# Patient Record
Sex: Male | Born: 1965 | Race: White | Hispanic: No | Smoking: Current every day smoker
Health system: Southern US, Community
[De-identification: ages and names within clinical notes are randomized; demographics above are authoritative.]

## PROBLEM LIST (undated history)

## (undated) DIAGNOSIS — F329 Major depressive disorder, single episode, unspecified: Secondary | ICD-10-CM

## (undated) DIAGNOSIS — J45909 Unspecified asthma, uncomplicated: Secondary | ICD-10-CM

## (undated) DIAGNOSIS — E669 Obesity, unspecified: Secondary | ICD-10-CM

## (undated) DIAGNOSIS — Z720 Tobacco use: Secondary | ICD-10-CM

## (undated) DIAGNOSIS — N183 Chronic kidney disease, stage 3 unspecified: Secondary | ICD-10-CM

## (undated) DIAGNOSIS — I10 Essential (primary) hypertension: Secondary | ICD-10-CM

## (undated) DIAGNOSIS — K3184 Gastroparesis: Secondary | ICD-10-CM

## (undated) DIAGNOSIS — J449 Chronic obstructive pulmonary disease, unspecified: Secondary | ICD-10-CM

## (undated) DIAGNOSIS — C439 Malignant melanoma of skin, unspecified: Secondary | ICD-10-CM

## (undated) DIAGNOSIS — I219 Acute myocardial infarction, unspecified: Secondary | ICD-10-CM

## (undated) DIAGNOSIS — E785 Hyperlipidemia, unspecified: Secondary | ICD-10-CM

## (undated) DIAGNOSIS — I509 Heart failure, unspecified: Secondary | ICD-10-CM

## (undated) DIAGNOSIS — F32A Depression, unspecified: Secondary | ICD-10-CM

## (undated) DIAGNOSIS — R339 Retention of urine, unspecified: Secondary | ICD-10-CM

## (undated) DIAGNOSIS — K219 Gastro-esophageal reflux disease without esophagitis: Secondary | ICD-10-CM

## (undated) DIAGNOSIS — F419 Anxiety disorder, unspecified: Secondary | ICD-10-CM

## (undated) DIAGNOSIS — I2510 Atherosclerotic heart disease of native coronary artery without angina pectoris: Secondary | ICD-10-CM

## (undated) DIAGNOSIS — D369 Benign neoplasm, unspecified site: Secondary | ICD-10-CM

## (undated) DIAGNOSIS — E1122 Type 2 diabetes mellitus with diabetic chronic kidney disease: Secondary | ICD-10-CM

## (undated) HISTORY — DX: Gastroparesis: K31.84

## (undated) HISTORY — DX: Type 2 diabetes mellitus with diabetic chronic kidney disease: E11.22

## (undated) HISTORY — DX: Chronic kidney disease, stage 3 (moderate): N18.3

## (undated) HISTORY — DX: Depression, unspecified: F32.A

## (undated) HISTORY — PX: CORONARY ANGIOPLASTY WITH STENT PLACEMENT: SHX49

## (undated) HISTORY — DX: Chronic kidney disease, stage 3 unspecified: N18.30

## (undated) HISTORY — DX: Benign neoplasm, unspecified site: D36.9

## (undated) HISTORY — DX: Atherosclerotic heart disease of native coronary artery without angina pectoris: I25.10

## (undated) HISTORY — PX: CARDIAC CATHETERIZATION: SHX172

## (undated) HISTORY — DX: Malignant melanoma of skin, unspecified: C43.9

## (undated) HISTORY — DX: Major depressive disorder, single episode, unspecified: F32.9

## (undated) HISTORY — DX: Hyperlipidemia, unspecified: E78.5

---

## 2005-10-09 DIAGNOSIS — C439 Malignant melanoma of skin, unspecified: Secondary | ICD-10-CM

## 2005-10-09 HISTORY — PX: OTHER SURGICAL HISTORY: SHX169

## 2005-10-09 HISTORY — DX: Malignant melanoma of skin, unspecified: C43.9

## 2010-10-09 DIAGNOSIS — I219 Acute myocardial infarction, unspecified: Secondary | ICD-10-CM

## 2010-10-09 HISTORY — DX: Acute myocardial infarction, unspecified: I21.9

## 2010-10-10 ENCOUNTER — Inpatient Hospital Stay (HOSPITAL_COMMUNITY)
Admission: EM | Admit: 2010-10-10 | Discharge: 2010-10-12 | Payer: Self-pay | Source: Home / Self Care | Attending: Cardiovascular Disease | Admitting: Cardiovascular Disease

## 2010-10-12 LAB — LIPID PANEL
Cholesterol: 173 mg/dL (ref 0–200)
HDL: 35 mg/dL — ABNORMAL LOW (ref >39)
LDL Cholesterol: 100 mg/dL — ABNORMAL HIGH (ref 0–99)
Total CHOL/HDL Ratio: 4.9 RATIO
Triglycerides: 188 mg/dL — ABNORMAL HIGH (ref <150)
VLDL: 38 mg/dL (ref 0–40)

## 2010-10-12 LAB — BRAIN NATRIURETIC PEPTIDE: Pro B Natriuretic peptide (BNP): 102 pg/mL — ABNORMAL HIGH (ref 0.0–100.0)

## 2010-10-12 LAB — GLUCOSE, CAPILLARY
Glucose-Capillary: 234 mg/dL — ABNORMAL HIGH (ref 70–99)
Glucose-Capillary: 241 mg/dL — ABNORMAL HIGH (ref 70–99)

## 2010-10-12 LAB — CORTISOL: Cortisol, Plasma: 17 ug/dL

## 2010-10-17 ENCOUNTER — Ambulatory Visit
Admission: RE | Admit: 2010-10-17 | Discharge: 2010-10-17 | Payer: Self-pay | Source: Home / Self Care | Attending: Adult Health | Admitting: Adult Health

## 2010-10-17 ENCOUNTER — Encounter: Payer: Self-pay | Admitting: Adult Health

## 2010-10-18 ENCOUNTER — Ambulatory Visit: Admit: 2010-10-18 | Payer: Self-pay | Admitting: Cardiology

## 2010-10-18 ENCOUNTER — Encounter: Payer: Self-pay | Admitting: Adult Health

## 2010-11-10 NOTE — Letter (Signed)
Summary: [place] [employer], agricultural at [employer]  [address] [place] [address]   Phone: [phone]  Fax: [phone]     October 17, 2010 MRN: [id number]   [facility] [address] [place], [place]  [address]      YOUR LAB WORK IS DUE  January 09, 2011 _________________________________________  Please go to [facility], located across the street from [facility] on the second floor.  Hours are Monday - Friday 7am until 7:30pm         Saturday 8am until 12noon    _X_  DO NOT EAT OR DRINK AFTER MIDNIGHT EVENING PRIOR TO LABWORK  __ YOUR LABWORK IS NOT FASTING --YOU MAY EAT PRIOR TO LABWORK

## 2010-11-10 NOTE — Assessment & Plan Note (Signed)
Summary: eph needed mon per wife/sn   Visit Type:  Follow-up Primary Provider:  rchd   History of Present Illness: Dustin Cruz is a 45 y/o CM we are seeing on hospital follow-up after admission to [facility] for STEMI on 10/10/2010 and as a result PCI using BMS to 99% ostial hazy stenosis of the ramus.  He was subsequently placed on Effient, Simvistatin, metoprolol, and ASA in addition to previous medications.  He has history of diabetes, and depression with remote ETOH abuse-abstinence with ongoing AA attendence.  He has complaints today of fatigue but is feeling better than when he was hospitalized.  He denies any c/o chest pain, shortness of breath and fluid retention.  He quit smoking and is medically compliant. He remains unemployed and is followed by [facility]. He has occaisional upper abdominal cramps which can be painful to him, but not limiting.  He has not seen his primary since discharge.  Preventive Screening-Counseling & Management  Alcohol-Tobacco     Smoking Status: never  Current Medications (verified): 1)  Neurontin 400 Mg Caps (Gabapentin) .... Take 1 Tab Three Times A Day 2)  Aspirin 325 Mg Tabs (Aspirin) .... Take 1 Tab Daily 3)  Glipizide 10 Mg Tabs (Glipizide) .... Take 1 Tab Two Times A Day 4)  Lopid 600 Mg Tabs (Gemfibrozil) .... Take 1 Tab Two Times A Day 5)  Metformin Hcl 1000 Mg Tabs (Metformin Hcl) .... Take 1 Tab Two Times A Day 6)  Loratadine 10 Mg Tabs (Loratadine) .... Take 1 Tab Daily 7)  Celexa 40 Mg Tabs (Citalopram Hydrobromide) .... Take 1 Tab Daily 8)  Acetaminophen 325 Mg Tabs (Acetaminophen) .... Take As Needed For Pain 9)  Simvastatin 40 Mg Tabs (Simvastatin) .... Take 1 Tab Daily 10)  Metoprolol Tartrate 25 Mg Tabs (Metoprolol Tartrate) .... Take 1 Tab Two Times A Day 11)  Prinivil 5 Mg Tabs (Lisinopril) .... Take 1 Tab Daily 12)  Nitro-Dur 0.4 Mg/hr Pt24 (Nitroglycerin) .... Use As Directed For Chest Pain 13)  Effient 10 Mg Tabs  (Prasugrel Hcl) .... Take 1 Tab Daily 14)  Trazodone Hcl 50 Mg Tabs (Trazodone Hcl) .... Take 1 Tab At Bedtime  Allergies (verified): No Known Drug Allergies  Comments:  Nurse/Medical Assistant: patient brought med list he uses [facility] in [place]  Social History: Full Time Married   Alcohol Use - no Regular Exercise - no Drug Use - no Tobacco Use - No.  Smoking Status:  never  Review of Systems       Abdominal cramping at times.  All other systems have been reviewed and are negative unless stated above.   Vital Signs:  Patient profile:   45 year old male Height:      74 inches Weight:      210 pounds BMI:     27.06 O2 Sat:      96 % on Room air Pulse rate:   81 / minute BP sitting:   112 / 76  (left arm)  Vitals Entered By: Dreama Saa, CNA (October 17, 2010 1:30 PM)  O2 Flow:  Room air  Physical Exam  General:  Well developed, well nourished, in no acute distress. Eyes:  Bilateral xalanthmus Lungs:  Clear bilaterally to auscultation and percussion. Heart:  Non-displaced PMI, chest non-tender; regular rate and rhythm, S1, S2 without murmurs, rubs or gallops. Carotid upstroke normal, no bruit. Normal abdominal aortic size, no bruits. Femorals normal pulses, no bruits. Pedals normal pulses. No edema,  no varicosities. Abdomen:  Bowel sounds positive; abdomen soft and non-tender without masses, organomegaly, or hernias noted. No hepatosplenomegaly. Msk:  Back normal, normal gait. Muscle strength and tone normal. Right groin well healed, no bleed bruit or hematoma. Pulses:  pulses normal in all 4 extremities Extremities:  No clubbing or cyanosis. Neurologic:  Alert and oriented x 3. Psych:  depressed affect.     EKG  Procedure date:  10/17/2010  Findings:      Normal sinus rhythm with rate of:  75 Non specific intraventricular block.  Comments:      Evidence of recent inferior MI.  Evidence of recent lateral MI.    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  Remains on Effient and is on drug program for assistance in getting this medication.  I have given him samples as weill until drug assistance has supplied this medication.  He is without cardiac complaint.  BP and HR are stable.  We will see him in 3 months.  He is to avoid lifting anything over 20lbs.  He is at [employer] and is being trained for new skills.  I have encouraged him to continue this. The following medications were removed from the medication list:    Accupril 5 Mg Tabs (Quinapril hcl) .[name]... Take 1 tab daily His updated medication list for this problem includes:    Aspirin 325 Mg Tabs (Aspirin) .[name]... Take 1 tab daily    Metoprolol Tartrate 25 Mg Tabs (Metoprolol tartrate) .[name]... Take 1 tab two times a day    Prinivil 5 Mg Tabs (Lisinopril) .[name]... Take 1 tab daily    Nitro-dur 0.4 Mg/hr Pt24 (Nitroglycerin) ..... Use as directed for chest pain    Effient 10 Mg Tabs (Prasugrel hcl) .[name]... Take 1 tab daily  Problem # 2:  R/O HYPERLIPIDEMIA (ICD-272.4) He will have follow-up cholesterol studies in 3 months. The following medications were removed from the medication list:    Pravastatin Sodium 40 Mg Tabs (Pravastatin sodium) .[name]... Take 1 tab daily His updated medication list for this problem includes:    Lopid 600 Mg Tabs (Gemfibrozil) .[name]... Take 1 tab two times a day    Simvastatin 40 Mg Tabs (Simvastatin) .[name]... Take 1 tab daily  Future Orders: T-Lipid Profile ([id number]) ... 01/09/2011 T-Hepatic Function [id number]) ... 01/09/2011  Patient Instructions: 1)  Your physician recommends that you schedule a follow-up appointment in: 3 months 2)  Your physician recommends that you return for lab work in: 3 months, just before next office visit. 3)  Your physician recommends that you continue on your current medications as directed. Please refer to the Current Medication list given to you  today. Prescriptions: EFFIENT 10 MG TABS (PRASUGREL HCL) take 1 tab daily  #28 x 0   Entered by:   Larita Fife Via LPN   Authorized by:   Joni Reining, NP   Signed by:   Larita Fife Via LPN on 08/65/7846   Method used:   Samples Given   RxID:   [id number]  Lot # [id number]  Exp date 11/12 =14 tabs Lot# [id number]   Exp date: 08/12= 14 tabs

## 2010-12-19 LAB — CBC
HCT: 40.8 % (ref 39.0–52.0)
HCT: 44.3 % (ref 39.0–52.0)
Hemoglobin: 14.2 g/dL (ref 13.0–17.0)
Hemoglobin: 15.8 g/dL (ref 13.0–17.0)
MCH: 32.3 pg (ref 26.0–34.0)
MCH: 32.7 pg (ref 26.0–34.0)
MCHC: 34.8 g/dL (ref 30.0–36.0)
MCHC: 35.7 g/dL (ref 30.0–36.0)
MCV: 91.7 fL (ref 78.0–100.0)
MCV: 92.9 fL (ref 78.0–100.0)
Platelets: 291 10*3/uL (ref 150–400)
Platelets: 328 10*3/uL (ref 150–400)
RBC: 4.39 MIL/uL (ref 4.22–5.81)
RBC: 4.83 MIL/uL (ref 4.22–5.81)
RDW: 12.8 % (ref 11.5–15.5)
RDW: 13.1 % (ref 11.5–15.5)
WBC: 11.7 10*3/uL — ABNORMAL HIGH (ref 4.0–10.5)
WBC: 6.4 10*3/uL (ref 4.0–10.5)

## 2010-12-19 LAB — BASIC METABOLIC PANEL
BUN: 14 mg/dL (ref 6–23)
BUN: 16 mg/dL (ref 6–23)
CO2: 26 mEq/L (ref 19–32)
CO2: 26 mEq/L (ref 19–32)
Calcium: 8.9 mg/dL (ref 8.4–10.5)
Calcium: 9.2 mg/dL (ref 8.4–10.5)
Chloride: 101 mEq/L (ref 96–112)
Chloride: 102 mEq/L (ref 96–112)
Creatinine, Ser: 0.87 mg/dL (ref 0.4–1.5)
Creatinine, Ser: 0.91 mg/dL (ref 0.4–1.5)
GFR calc Af Amer: >60 mL/min (ref >60)
GFR calc Af Amer: >60 mL/min (ref >60)
GFR calc non Af Amer: >60 mL/min (ref >60)
GFR calc non Af Amer: >60 mL/min (ref >60)
Glucose, Bld: 196 mg/dL — ABNORMAL HIGH (ref 70–99)
Glucose, Bld: 314 mg/dL — ABNORMAL HIGH (ref 70–99)
Potassium: 4.3 mEq/L (ref 3.5–5.1)
Potassium: 4.5 mEq/L (ref 3.5–5.1)
Sodium: 136 mEq/L (ref 135–145)
Sodium: 137 mEq/L (ref 135–145)

## 2010-12-19 LAB — GLUCOSE, CAPILLARY
Glucose-Capillary: 104 mg/dL — ABNORMAL HIGH (ref 70–99)
Glucose-Capillary: 183 mg/dL — ABNORMAL HIGH (ref 70–99)
Glucose-Capillary: 217 mg/dL — ABNORMAL HIGH (ref 70–99)
Glucose-Capillary: 254 mg/dL — ABNORMAL HIGH (ref 70–99)
Glucose-Capillary: 254 mg/dL — ABNORMAL HIGH (ref 70–99)

## 2010-12-19 LAB — POCT I-STAT 3, ART BLOOD GAS (G3+)
Acid-Base Excess: 1 mmol/L (ref 0.0–2.0)
Bicarbonate: 27.1 mEq/L — ABNORMAL HIGH (ref 20.0–24.0)
O2 Saturation: 96 %
TCO2: 28 mmol/L (ref 0–100)
pCO2 arterial: 44.8 mmHg (ref 35.0–45.0)
pH, Arterial: 7.389 (ref 7.350–7.450)
pO2, Arterial: 86 mmHg (ref 80.0–100.0)

## 2010-12-19 LAB — POCT I-STAT, CHEM 8
BUN: 19 mg/dL (ref 6–23)
Calcium, Ion: 1.2 mmol/L (ref 1.12–1.32)
Chloride: 102 mEq/L (ref 96–112)
Creatinine, Ser: 1 mg/dL (ref 0.4–1.5)
Glucose, Bld: 319 mg/dL — ABNORMAL HIGH (ref 70–99)
HCT: 47 % (ref 39.0–52.0)
Hemoglobin: 16 g/dL (ref 13.0–17.0)
Potassium: 4.4 mEq/L (ref 3.5–5.1)
Sodium: 136 mEq/L (ref 135–145)
TCO2: 27 mmol/L (ref 0–100)

## 2010-12-19 LAB — MRSA PCR SCREENING: MRSA by PCR: NEGATIVE

## 2010-12-19 LAB — D-DIMER, QUANTITATIVE: D-Dimer, Quant: 0.23 ug/mL-FEU (ref 0.00–0.48)

## 2010-12-19 LAB — CARDIAC PANEL(CRET KIN+CKTOT+MB+TROPI)
CK, MB: 2.2 ng/mL (ref 0.3–4.0)
Relative Index: INVALID (ref 0.0–2.5)
Total CK: 44 U/L (ref 7–232)
Troponin I: 0.03 ng/mL (ref 0.00–0.06)

## 2010-12-19 LAB — HEMOGLOBIN A1C
Hgb A1c MFr Bld: 10.2 % — ABNORMAL HIGH (ref <5.7)
Mean Plasma Glucose: 246 mg/dL — ABNORMAL HIGH (ref <117)

## 2011-01-06 ENCOUNTER — Ambulatory Visit (INDEPENDENT_AMBULATORY_CARE_PROVIDER_SITE_OTHER): Payer: Self-pay | Admitting: Adult Health

## 2011-01-06 ENCOUNTER — Encounter: Payer: Self-pay | Admitting: Adult Health

## 2011-01-06 DIAGNOSIS — I2510 Atherosclerotic heart disease of native coronary artery without angina pectoris: Secondary | ICD-10-CM

## 2011-01-06 DIAGNOSIS — E7800 Pure hypercholesterolemia, unspecified: Secondary | ICD-10-CM

## 2011-01-06 DIAGNOSIS — E785 Hyperlipidemia, unspecified: Secondary | ICD-10-CM

## 2011-01-06 NOTE — Progress Notes (Signed)
  Subjective:    Patient ID: Dustin Cruz, male    DOB: 23-Oct-1965, 45 y.o.   MRN: [id number]  HPI Dustin Cruz returns today for 3 month follow-up with known history of CAD with BMS to the Ramus in 10/2010.  He is followed by the health department and receives drug assistance with Effient, Crestor, and metoprolol.  He is very sedentary and was told in the past not to lift weight over 5 lbs. He continues to feel some shortness of breath with exertion which is related to decreased stamina. He denies other symptoms at this time.  He has not had any labs drawn for over 3 months.  He also suffers from depression and ETOH abuse for which he has been abstinent since the first of the year and attend AA support groups. He is accompanied by his case worker.     Review of SystemsReview of systems complete and found to be negative unless listed above     Objective:   Physical ExamGeneral: Well developed, well nourished, in no acute distress Head: Eyes PERRLA,  Bilateral xanthomas.   Normal cephalic and atramatic  Lungs: Clear bilaterally to auscultation and percussion. Heart: HRRR S1 S2, with soft S4 murmur.  Pulses are 2+ & equal.            No carotid bruit. No JVD.  No abdominal bruits. No femoral bruits. Abdomen: Bowel sounds are positive, abdomen soft and non-tender without masses or                  Hernia's noted. Obese Msk:  Back normal, normal gait. Normal strength and tone for age. Extremities: No clubbing, cyanosis or edema.  DP +1 Neuro: Alert and oriented X 3. Psych:  Good affect, responds appropriately        Assessment & Plan:

## 2011-01-06 NOTE — Patient Instructions (Signed)
Your physician recommends that you schedule a follow-up appointment in: 6 months Your physician recommends that you return for lab work in: soon Your physician has recommended you make the following change in your medication: STOP TAKING ZOCOR (continue to take 1 Crestor tablet at bedtime) Your physician encouraged you to lose weight for better health. Please start a low chlesterol diet. Your physician discussed the importance of regular exercise and recommended that you start or continue a regular exercise program for good health. Please try to walk 30 mins. Everyday.

## 2011-02-10 ENCOUNTER — Encounter: Payer: Self-pay | Admitting: Adult Health

## 2011-06-20 ENCOUNTER — Ambulatory Visit (INDEPENDENT_AMBULATORY_CARE_PROVIDER_SITE_OTHER): Payer: Self-pay | Admitting: Adult Health

## 2011-06-20 ENCOUNTER — Encounter: Payer: Self-pay | Admitting: Adult Health

## 2011-06-20 DIAGNOSIS — I2510 Atherosclerotic heart disease of native coronary artery without angina pectoris: Secondary | ICD-10-CM

## 2011-06-20 DIAGNOSIS — E7800 Pure hypercholesterolemia, unspecified: Secondary | ICD-10-CM

## 2011-06-20 NOTE — Progress Notes (Signed)
HPI:Dustin Cruz is a 45 y/o male patient of Dr. Dietrich Pates with history of CAD with BMS to the Ramus, in 10/2009. He is followed by the health dept in [place] for drug assistance and medical management. He denies any recurrence of chest pain, shortness of breath, but has some lightheadedness. He has a history of depression and former ETOH abuse and is followed by psychiatry.   No Known Allergies  Current Outpatient Prescriptions  Medication Sig Dispense Refill  . aspirin 325 MG tablet Take 325 mg by mouth daily.        . citalopram (CELEXA) 40 MG tablet Take 40 mg by mouth daily.        [name] gabapentin (NEURONTIN) 400 MG capsule Take 400 mg by mouth 3 (three) times daily.        [name] gemfibrozil (LOPID) 600 MG tablet Take 600 mg by mouth 2 (two) times daily before a meal.        . glipiZIDE (GLUCOTROL) 10 MG tablet Take 10 mg by mouth 2 (two) times daily before a meal.        . insulin glargine (LANTUS SOLOSTAR) 100 UNIT/ML injection Inject 10 Units into the skin at bedtime.        [name] lisinopril (PRINIVIL,ZESTRIL) 5 MG tablet Take 5 mg by mouth daily.        . Loratadine 10 MG CAPS Take 1 capsule by mouth daily.        . metFORMIN (GLUCOPHAGE) 1000 MG tablet Take 1,000 mg by mouth 2 (two) times daily with a meal.        . metoprolol tartrate (LOPRESSOR) 25 MG tablet Take 25 mg by mouth 2 (two) times daily.        . nitroGLYCERIN (NITROSTAT) 0.4 MG SL tablet Place 0.4 mg under the tongue every 5 (five) minutes as needed.        . prasugrel (EFFIENT) 10 MG TABS Take 10 mg by mouth daily.        . rosuvastatin (CRESTOR) 10 MG tablet Take 10 mg by mouth daily.        . traZODone (DESYREL) 50 MG tablet Take 50 mg by mouth at bedtime.          Past Medical History  Diagnosis Date  . Diabetes mellitus   . Hyperlipidemia   . Depression   . Coronary artery disease     99% ostial ramus.  s/p PCI with BMS 10/2010    No past surgical history on file.  ZOX:WRUEAV of systems complete and found to be negative  unless listed above PHYSICAL EXAM BP 121/81  Pulse 72  Resp 18  Ht 6\' 1"  (1.854 m)  Wt 217 lb 6.4 oz (98.612 kg)  BMI 28.68 kg/m2  SpO2 95%  General: Well developed, well nourished, in no acute distress Head: Eyes PERRLA, injected. No xanthomas.   Normal cephalic and atramatic  Lungs: Clear bilaterally to auscultation and percussion. Heart: HRRR S1 S2, without MRG.  Pulses are 2+ & equal.            No carotid bruit. No JVD.  No abdominal bruits. No femoral bruits. Abdomen: Bowel sounds are positive, abdomen soft and non-tender without masses or  Hernia's noted. Msk:  Back normal, normal gait. Normal strength and tone for age. Extremities: No clubbing, cyanosis or edema.  DP +1 Neuro: Alert and oriented X 3. Psych:  Good affect, responds appropriately   ASSESSMENT AND PLAN

## 2011-06-20 NOTE — Assessment & Plan Note (Signed)
He will have fasting lipids and LFT's drawn at health dept and results will be sent to [place]. I have given him samples of Crestor 10mg  to take with him.

## 2011-06-20 NOTE — Assessment & Plan Note (Signed)
He is stable from cardiac standpoint without symptoms.  He may be able to be taken off of prasergrel on next visit as he has a BMS and has had plenty of time to endothilialize.  He is concerned about affording his medications should he be unable to keep getting them from the health dept.  I have given samples of prasergrel for now and will stop this medication on next visit in 6 months.

## 2011-06-20 NOTE — Patient Instructions (Signed)
**Note De-identified Dustin Cruz Obfuscation** Your physician recommends that you continue on your current medications as directed. Please refer to the Current Medication list given to you today.  Your physician recommends that you schedule a follow-up appointment in: 6 months  

## 2011-09-19 ENCOUNTER — Other Ambulatory Visit: Payer: Self-pay

## 2011-09-19 ENCOUNTER — Emergency Department (HOSPITAL_COMMUNITY)
Admission: EM | Admit: 2011-09-19 | Discharge: 2011-09-19 | Disposition: A | Discharge status: Home / Self Care | Payer: Self-pay | Attending: Emergency Medicine | Admitting: Emergency Medicine

## 2011-09-19 ENCOUNTER — Encounter (HOSPITAL_COMMUNITY): Payer: Self-pay

## 2011-09-19 DIAGNOSIS — F3289 Other specified depressive episodes: Secondary | ICD-10-CM | POA: Insufficient documentation

## 2011-09-19 DIAGNOSIS — I2510 Atherosclerotic heart disease of native coronary artery without angina pectoris: Secondary | ICD-10-CM | POA: Insufficient documentation

## 2011-09-19 DIAGNOSIS — R296 Repeated falls: Secondary | ICD-10-CM | POA: Insufficient documentation

## 2011-09-19 DIAGNOSIS — Z7982 Long term (current) use of aspirin: Secondary | ICD-10-CM | POA: Insufficient documentation

## 2011-09-19 DIAGNOSIS — E669 Obesity, unspecified: Secondary | ICD-10-CM | POA: Insufficient documentation

## 2011-09-19 DIAGNOSIS — E119 Type 2 diabetes mellitus without complications: Secondary | ICD-10-CM | POA: Insufficient documentation

## 2011-09-19 DIAGNOSIS — M79609 Pain in unspecified limb: Secondary | ICD-10-CM | POA: Insufficient documentation

## 2011-09-19 DIAGNOSIS — F329 Major depressive disorder, single episode, unspecified: Secondary | ICD-10-CM | POA: Insufficient documentation

## 2011-09-19 DIAGNOSIS — R1084 Generalized abdominal pain: Secondary | ICD-10-CM | POA: Insufficient documentation

## 2011-09-19 DIAGNOSIS — R51 Headache: Secondary | ICD-10-CM | POA: Insufficient documentation

## 2011-09-19 DIAGNOSIS — IMO0002 Reserved for concepts with insufficient information to code with codable children: Secondary | ICD-10-CM | POA: Insufficient documentation

## 2011-09-19 DIAGNOSIS — E785 Hyperlipidemia, unspecified: Secondary | ICD-10-CM | POA: Insufficient documentation

## 2011-09-19 DIAGNOSIS — Z794 Long term (current) use of insulin: Secondary | ICD-10-CM | POA: Insufficient documentation

## 2011-09-19 DIAGNOSIS — Z79899 Other long term (current) drug therapy: Secondary | ICD-10-CM | POA: Insufficient documentation

## 2011-09-19 DIAGNOSIS — M79604 Pain in right leg: Secondary | ICD-10-CM

## 2011-09-19 DIAGNOSIS — R29898 Other symptoms and signs involving the musculoskeletal system: Secondary | ICD-10-CM | POA: Insufficient documentation

## 2011-09-19 DIAGNOSIS — R112 Nausea with vomiting, unspecified: Secondary | ICD-10-CM | POA: Insufficient documentation

## 2011-09-19 LAB — CBC
MCH: 33.3 pg (ref 26.0–34.0)
MCV: 93.3 fL (ref 78.0–100.0)
Platelets: 405 10*3/uL — ABNORMAL HIGH (ref 150–400)
RBC: 4.48 MIL/uL (ref 4.22–5.81)

## 2011-09-19 LAB — COMPREHENSIVE METABOLIC PANEL
AST: 10 U/L (ref 0–37)
BUN: 18 mg/dL (ref 6–23)
CO2: 26 mEq/L (ref 19–32)
Calcium: 9.6 mg/dL (ref 8.4–10.5)
Creatinine, Ser: 0.92 mg/dL (ref 0.50–1.35)
GFR calc Af Amer: >90 mL/min (ref >90)
GFR calc non Af Amer: >90 mL/min (ref >90)
Glucose, Bld: 135 mg/dL — ABNORMAL HIGH (ref 70–99)

## 2011-09-19 LAB — POCT I-STAT TROPONIN I: Troponin i, poc: 0 ng/mL (ref 0.00–0.08)

## 2011-09-19 LAB — URINALYSIS, ROUTINE W REFLEX MICROSCOPIC
Leukocytes, UA: NEGATIVE
Nitrite: NEGATIVE
Specific Gravity, Urine: 1.014 (ref 1.005–1.030)
Urobilinogen, UA: 0.2 mg/dL (ref 0.0–1.0)

## 2011-09-19 MED ORDER — HYDROCODONE-ACETAMINOPHEN 5-325 MG PO TABS: 1.0000 | ORAL_TABLET | Freq: Four times a day (QID) | ORAL | Status: AC | PRN

## 2011-09-19 MED ORDER — MORPHINE SULFATE 4 MG/ML IJ SOLN
4.0000 mg | Freq: Once | INTRAMUSCULAR | Status: AC
  Administered 2011-09-19: 4 mg via INTRAVENOUS
  Filled 2011-09-19: qty 1

## 2011-09-19 MED ORDER — ONDANSETRON HCL 4 MG/2ML IJ SOLN
4.0000 mg | Freq: Once | INTRAMUSCULAR | Status: AC
  Administered 2011-09-19: 4 mg via INTRAVENOUS
  Filled 2011-09-19: qty 2

## 2011-09-19 MED ORDER — ONDANSETRON 4 MG PO TBDP: 4.0000 mg | ORAL_TABLET | Freq: Three times a day (TID) | ORAL | Status: AC | PRN

## 2011-09-19 NOTE — ED Notes (Signed)
Pt here by [facility] for sick x 3 days, n/v x 1 day, this am had right leg weakness, hx of MI last year with the same symptoms. Normal ekg.

## 2011-09-19 NOTE — ED Notes (Signed)
Pt ambulated with a steady gait; had crackers and soda with no nausea; no sign of distress; ambulated with a steady gait; VSS; A&Ox3. Pt reports no questions at this time.

## 2011-09-19 NOTE — ED Provider Notes (Signed)
History     CSN: [id number] Arrival date & time: 09/19/2011  4:07 PM   First MD Initiated Contact with Patient 09/19/11 1635      Chief Complaint  Patient presents with  . Extremity Weakness    right leg    (Consider location/radiation/quality/duration/timing/severity/associated sxs/prior treatment) Patient is a 45 y.o. male presenting with vomiting. The history is provided by the patient.  Emesis  This is a new problem. The current episode started yesterday. The problem occurs 5 to 10 times per day. The problem has not changed since onset.The emesis has an appearance of stomach contents and bilious material. There has been no fever. Associated symptoms include abdominal pain and headaches. Pertinent negatives include no chills, no cough, no diarrhea, no fever, no myalgias, no sweats and no URI.   patient describes generalized abdominal pain described as soreness that has been intermittent and began after the vomiting. Nothing makes the symptoms better or worse there has been no prior treatment. The patient also describes some intermittent weakness in the right leg for 5 days but denies numbness. He reports that today he felt like his right leg gave out on him and he fell to the floor. Describes pain to the right anterior lower leg is the only injuries sustained in the fall. He says he has had aching into the proximal right anterior thigh associated with this intermittent weakness and he questions whether it is related to a cardiac cath he had performed in January of this year.  Past Medical History  Diagnosis Date  . Diabetes mellitus   . Hyperlipidemia   . Depression   . Coronary artery disease     99% ostial ramus.  s/p PCI with BMS 10/2010    History reviewed. No pertinent past surgical history.  History reviewed. No pertinent family history.  History  Substance Use Topics  . Smoking status: Current Some Day Smoker -- 1.0 packs/day    Types: Cigarettes  . Smokeless tobacco:  Never Used  . Alcohol Use: No      Review of Systems  Constitutional: Negative for fever, chills and diaphoresis.  HENT: Negative for congestion, sore throat, neck pain, neck stiffness and tinnitus.   Eyes: Negative for pain and visual disturbance.  Respiratory: Negative for cough, chest tightness, shortness of breath and wheezing.   Cardiovascular: Negative for chest pain, palpitations and leg swelling.  Gastrointestinal: Positive for vomiting and abdominal pain. Negative for diarrhea.  Genitourinary: Negative for dysuria, hematuria and flank pain.  Musculoskeletal: Negative for myalgias, back pain, joint swelling and gait problem.       Leg pain as described in history of present illness  Skin: Negative for color change and rash.       Abrasion to right leg  Neurological: Positive for weakness and headaches. Negative for dizziness, syncope, facial asymmetry, speech difficulty, light-headedness and numbness.  Hematological: Does not bruise/bleed easily.  Psychiatric/Behavioral: Negative for behavioral problems and confusion.    Allergies  Review of patient's allergies indicates no known allergies.  Home Medications   Current Outpatient Rx  Name Route Sig Dispense Refill  . ASPIRIN 325 MG PO TABS Oral Take 325 mg by mouth daily.      [name] CITALOPRAM HYDROBROMIDE 40 MG PO TABS Oral Take 40 mg by mouth daily.      [name] GABAPENTIN 400 MG PO CAPS Oral Take 400 mg by mouth 3 (three) times daily.      [name] GEMFIBROZIL 600 MG PO TABS Oral Take 600  mg by mouth 2 (two) times daily before a meal.      . GLIPIZIDE 10 MG PO TABS Oral Take 10 mg by mouth 2 (two) times daily before a meal.      . INSULIN GLARGINE 100 UNIT/ML [name] SOLN Subcutaneous Inject 10 Units into the skin at bedtime.      [name] LISINOPRIL 5 MG PO TABS Oral Take 5 mg by mouth daily.      [name] LORATADINE 10 MG PO CAPS Oral Take 1 capsule by mouth daily.      [name] METFORMIN HCL 1000 MG PO TABS Oral Take 1,000 mg by mouth 2 (two) times daily  with a meal.      . METOPROLOL TARTRATE 25 MG PO TABS Oral Take 25 mg by mouth 2 (two) times daily.      [name] NITROGLYCERIN 0.4 MG SL SUBL Sublingual Place 0.4 mg under the tongue every 5 (five) minutes as needed. For chest pain.    [name] PRASUGREL HCL 10 MG PO TABS Oral Take 10 mg by mouth daily.      [name] ROSUVASTATIN CALCIUM 10 MG PO TABS Oral Take 10 mg by mouth daily.      . TRAZODONE HCL 50 MG PO TABS Oral Take 50 mg by mouth at bedtime.        BP 125/77  Pulse 75  Temp(Src) 98 F (36.7 C) (Oral)  Resp 18  SpO2 95%  Physical Exam  Nursing note and vitals reviewed. Constitutional: He is oriented to person, place, and time. He appears well-developed and well-nourished. No distress.  HENT:  Head: Normocephalic and atraumatic.  Right Ear: External ear normal.  Left Ear: External ear normal.  Mouth/Throat: Oropharynx is clear and moist.  Eyes: Conjunctivae and EOM are normal. Pupils are equal, round, and reactive to light.  Neck: Normal range of motion. Neck supple. No JVD present.  Cardiovascular: Normal rate, regular rhythm, normal heart sounds and intact distal pulses.   Pulmonary/Chest: Effort normal and breath sounds normal. No respiratory distress. He has no wheezes. He exhibits no tenderness.  Abdominal: Soft. Bowel sounds are normal.       Obese, soft, non-distended with no guarding, rebound, or mass. Generalized mild tenderness to deep palpation.  Musculoskeletal: Normal range of motion. He exhibits no edema and no tenderness.  Lymphadenopathy:    He has no cervical adenopathy.  Neurological: He is alert and oriented to person, place, and time. He displays normal reflexes. No cranial nerve deficit. Coordination normal.       Gait slow but with steady base. Sensation intact to light touch  Skin: Skin is warm and dry. No rash noted.       1 cm abrasion to right anterior mid lower leg with no bleeding  Psychiatric: He has a normal mood and affect. His behavior is normal.    ED  Course  Procedures (including critical care time)  Labs Reviewed  CBC - Abnormal; Notable for the following:    Platelets 405 (*)    All other components within normal limits  COMPREHENSIVE METABOLIC PANEL - Abnormal; Notable for the following:    Glucose, Bld 135 (*)    Total Bilirubin 0.2 (*)    All other components within normal limits  URINALYSIS, ROUTINE W REFLEX MICROSCOPIC - Abnormal; Notable for the following:    Glucose, UA 100 (*)    All other components within normal limits  POCT I-STAT TROPONIN I  I-STAT TROPONIN I   No results  found.   Date: 09/19/2011  Rate: 71  Rhythm: normal sinus rhythm  QRS Axis: normal  Intervals: normal  ST/T Wave abnormalities: nonspecific t-wave abnormality in lateral leads  Conduction Disutrbances:left bundle branch block  Narrative Interpretation:   Old EKG Reviewed: unchanged    Diagnosis #1: Nausea and vomiting Diagnosis #2: Right leg pain   MDM  Patient with one day of nausea and vomiting with generalized mild abdominal pain. Labs are unremarkable. The patient appears well and is afebrile; he has tolerated an oral trial in the emergency department.  As he reported concerns about his heart with a previous MI, an EKG was performed and this shows no changes from his prior; he also had a negative troponin after greater than 6 hours of symptoms.  Although he complains of right proximal anterior thigh pain, there is no tenderness to palpation and there is no strength deficit in the extremity. Radiographic studies are not indicated.  I have discussed the findings with the patient and he is comfortable with being discharged home with antiemetic and has been instructed to followup with his primary doctor in the next couple of days.        Elwyn Reach [place], [place] 09/19/11 2051  Shaaron Adler, [place] 09/19/11 2056

## 2011-09-19 NOTE — ED Notes (Signed)
RN introduced self to pt

## 2011-09-19 NOTE — ED Notes (Signed)
Patient is resting comfortably. Encouraged to call if nauseated.

## 2011-09-20 NOTE — ED Provider Notes (Signed)
Evaluation and management procedures were performed by the PA/NP under my supervision/collaboration.   Baldomero Mirarchi, MD 09/20/11 0034 

## 2011-12-27 ENCOUNTER — Ambulatory Visit (INDEPENDENT_AMBULATORY_CARE_PROVIDER_SITE_OTHER): Payer: Medicaid Other | Admitting: Adult Health

## 2011-12-27 ENCOUNTER — Encounter: Payer: Self-pay | Admitting: Adult Health

## 2011-12-27 VITALS — BP 126/83 | HR 76 | Ht 74.0 in | Wt 217.0 lb

## 2011-12-27 DIAGNOSIS — I2510 Atherosclerotic heart disease of native coronary artery without angina pectoris: Secondary | ICD-10-CM

## 2011-12-27 DIAGNOSIS — E7800 Pure hypercholesterolemia, unspecified: Secondary | ICD-10-CM

## 2011-12-27 MED ORDER — PRASUGREL HCL 10 MG PO TABS: 10.0000 mg | ORAL_TABLET | Freq: Every day | ORAL | Status: DC

## 2011-12-27 MED ORDER — METOPROLOL SUCCINATE ER 25 MG PO TB24: 25.0000 mg | ORAL_TABLET | Freq: Every day | ORAL | Status: DC

## 2011-12-27 MED ORDER — ROSUVASTATIN CALCIUM 10 MG PO TABS: 10.0000 mg | ORAL_TABLET | Freq: Every day | ORAL | Status: DC

## 2011-12-27 NOTE — Assessment & Plan Note (Signed)
He is without cardiac symptoms. He unfortunately continues to smoke and eat high fat diet from fast food. I will continue his medications as he is tolerating them without bleeding, coughing or fatigue. He will continue prasugrel as well. I have discussed this with him and he is willing to continue this medication. With his multiple CVRF and no change in his lifestyle, or modifiable risk factors, I feel it is best to keep him on anticoagulation indefinitely.

## 2011-12-27 NOTE — Progress Notes (Signed)
HPI: [name] is a 46 y/o patient of Dr. Dietrich Pates we are seeing for ongoing assessment and treatment of CAD with BMS to ramus in 10/2009. He is followed by the health dept in [place] for drug assistance and medical management of diabetes, depression/anxiety. He, unfortunately, continues to smoke 2ppd, drink ETOH. He is complaint with his medications. He has had recent labs per the health dept on but we have copies only of the 08/2011 results. He is without complaint with the exception of chronic neuropathy pain in his hands and feet. No chest pain, no DOE. He has no desire to quit smoking.  No Known Allergies  Current Outpatient Prescriptions  Medication Sig Dispense Refill  . aspirin 325 MG tablet Take 325 mg by mouth daily.        . citalopram (CELEXA) 40 MG tablet Take 20 mg by mouth daily.       [name] gabapentin (NEURONTIN) 400 MG capsule Take 400 mg by mouth 3 (three) times daily.        [name] gemfibrozil (LOPID) 600 MG tablet Take 600 mg by mouth 2 (two) times daily before a meal.        . insulin glargine (LANTUS SOLOSTAR) 100 UNIT/ML injection Inject 10 Units into the skin at bedtime.        . Loratadine 10 MG CAPS Take 1 capsule by mouth daily.        . metFORMIN (GLUCOPHAGE) 1000 MG tablet Take 1,000 mg by mouth 2 (two) times daily with a meal.        . metoprolol succinate (TOPROL-XL) 25 MG 24 hr tablet Take 1 tablet (25 mg total) by mouth daily.  30 tablet  12  . naproxen (NAPROSYN) 500 MG tablet Take 500 mg by mouth as needed.      . nitroGLYCERIN (NITROSTAT) 0.4 MG SL tablet Place 0.4 mg under the tongue every 5 (five) minutes as needed. For chest pain.      . prasugrel (EFFIENT) 10 MG TABS Take 1 tablet (10 mg total) by mouth daily.  30 tablet  12  . rosuvastatin (CRESTOR) 10 MG tablet Take 1 tablet (10 mg total) by mouth daily.  30 tablet  12  . traZODone (DESYREL) 50 MG tablet Take 50 mg by mouth at bedtime.          Past Medical History  Diagnosis Date  . Diabetes mellitus   .  Hyperlipidemia   . Depression   . Coronary artery disease     99% ostial ramus.  s/p PCI with BMS 10/2010    No past surgical history on file.  ROS: Review of systems complete and found to be negative unless listed above PHYSICAL EXAM BP 126/83  Pulse 76  Ht 6\' 2"  (1.88 m)  Wt 217 lb (98.431 kg)  BMI 27.86 kg/m2  General: Well developed, well nourished, in no acute distress Head: Eyes PERRLA, No xanthomas.   Normal cephalic and atramatic  Lungs: Clear bilaterally to auscultation and percussion. Heart: HRRR S1 S2, without MRG.  Pulses are 2+ & equal.            No carotid bruit. No JVD.  No abdominal bruits. No femoral bruits. Abdomen: Bowel sounds are positive, abdomen soft and non-tender without masses or                  Hernia's noted. Msk:  Back normal, normal gait. Normal strength and tone for age. Extremities: No clubbing, cyanosis or edema.  DP +1 Neuro: Alert and oriented X 3. Psych:  Flat affect, responds appropriately    ASSESSMENT AND PLAN

## 2011-12-27 NOTE — Assessment & Plan Note (Signed)
Most recent TC 171, TG 132, HDL 42 LDL 103. He has had recent labs completed, will await those results. Continue crestor as directed.

## 2011-12-27 NOTE — Patient Instructions (Signed)
Your physician recommends that you schedule a follow-up appointment in: 6 months  Stool Cards x 3 and return to office

## 2012-01-22 ENCOUNTER — Emergency Department (HOSPITAL_COMMUNITY): Payer: Medicaid Other

## 2012-01-22 ENCOUNTER — Other Ambulatory Visit: Payer: Self-pay

## 2012-01-22 ENCOUNTER — Inpatient Hospital Stay (HOSPITAL_COMMUNITY)
Admission: EM | Admit: 2012-01-22 | Discharge: 2012-01-23 | DRG: 287 | Disposition: A | Discharge status: Home / Self Care | Payer: Medicaid Other | Source: Ambulatory Visit | Attending: Internal Medicine | Admitting: Internal Medicine

## 2012-01-22 ENCOUNTER — Encounter (HOSPITAL_COMMUNITY): Payer: Self-pay | Admitting: *Deleted

## 2012-01-22 ENCOUNTER — Encounter (HOSPITAL_COMMUNITY)
Admission: EM | Disposition: A | Discharge status: Home / Self Care | Payer: Self-pay | Source: Ambulatory Visit | Attending: Internal Medicine

## 2012-01-22 ENCOUNTER — Ambulatory Visit (HOSPITAL_COMMUNITY): Admit: 2012-01-22 | Payer: Self-pay | Admitting: Internal Medicine

## 2012-01-22 DIAGNOSIS — I2510 Atherosclerotic heart disease of native coronary artery without angina pectoris: Principal | ICD-10-CM | POA: Diagnosis present

## 2012-01-22 DIAGNOSIS — F329 Major depressive disorder, single episode, unspecified: Secondary | ICD-10-CM | POA: Diagnosis present

## 2012-01-22 DIAGNOSIS — R0609 Other forms of dyspnea: Secondary | ICD-10-CM | POA: Diagnosis present

## 2012-01-22 DIAGNOSIS — E119 Type 2 diabetes mellitus without complications: Secondary | ICD-10-CM | POA: Diagnosis present

## 2012-01-22 DIAGNOSIS — R0989 Other specified symptoms and signs involving the circulatory and respiratory systems: Secondary | ICD-10-CM | POA: Diagnosis present

## 2012-01-22 DIAGNOSIS — Z7982 Long term (current) use of aspirin: Secondary | ICD-10-CM

## 2012-01-22 DIAGNOSIS — E7800 Pure hypercholesterolemia, unspecified: Secondary | ICD-10-CM | POA: Diagnosis present

## 2012-01-22 DIAGNOSIS — I200 Unstable angina: Secondary | ICD-10-CM | POA: Diagnosis present

## 2012-01-22 DIAGNOSIS — Z6827 Body mass index (BMI) 27.0-27.9, adult: Secondary | ICD-10-CM

## 2012-01-22 DIAGNOSIS — R079 Chest pain, unspecified: Secondary | ICD-10-CM

## 2012-01-22 DIAGNOSIS — F17200 Nicotine dependence, unspecified, uncomplicated: Secondary | ICD-10-CM | POA: Diagnosis present

## 2012-01-22 DIAGNOSIS — F3289 Other specified depressive episodes: Secondary | ICD-10-CM | POA: Diagnosis present

## 2012-01-22 DIAGNOSIS — Z9861 Coronary angioplasty status: Secondary | ICD-10-CM

## 2012-01-22 DIAGNOSIS — E669 Obesity, unspecified: Secondary | ICD-10-CM | POA: Diagnosis present

## 2012-01-22 DIAGNOSIS — I447 Left bundle-branch block, unspecified: Secondary | ICD-10-CM | POA: Diagnosis present

## 2012-01-22 DIAGNOSIS — I213 ST elevation (STEMI) myocardial infarction of unspecified site: Secondary | ICD-10-CM

## 2012-01-22 DIAGNOSIS — Z794 Long term (current) use of insulin: Secondary | ICD-10-CM

## 2012-01-22 HISTORY — PX: LEFT HEART CATHETERIZATION WITH CORONARY ANGIOGRAM: SHX5451

## 2012-01-22 LAB — CBC
HCT: 42.1 % (ref 39.0–52.0)
Hemoglobin: 14 g/dL (ref 13.0–17.0)
Hemoglobin: 15.3 g/dL (ref 13.0–17.0)
MCH: 33.1 pg (ref 26.0–34.0)
MCHC: 36.3 g/dL — ABNORMAL HIGH (ref 30.0–36.0)
MCV: 91.1 fL (ref 78.0–100.0)
Platelets: 369 10*3/uL (ref 150–400)
Platelets: 380 K/uL (ref 150–400)
RBC: 4.39 MIL/uL (ref 4.22–5.81)
RBC: 4.62 MIL/uL (ref 4.22–5.81)
RDW: 12.7 % (ref 11.5–15.5)
WBC: 9.4 K/uL (ref 4.0–10.5)

## 2012-01-22 LAB — POCT I-STAT, CHEM 8
BUN: 23 mg/dL (ref 6–23)
Calcium, Ion: 1.17 mmol/L (ref 1.12–1.32)
Chloride: 104 meq/L (ref 96–112)
Creatinine, Ser: 1.1 mg/dL (ref 0.50–1.35)
Glucose, Bld: 341 mg/dL — ABNORMAL HIGH (ref 70–99)
HCT: 45 % (ref 39.0–52.0)
Hemoglobin: 15.3 g/dL (ref 13.0–17.0)
Potassium: 4.3 meq/L (ref 3.5–5.1)
Sodium: 138 meq/L (ref 135–145)
TCO2: 25 mmol/L (ref 0–100)

## 2012-01-22 LAB — POCT ACTIVATED CLOTTING TIME: Activated Clotting Time: 149 seconds

## 2012-01-22 LAB — GLUCOSE, CAPILLARY
Glucose-Capillary: 162 mg/dL — ABNORMAL HIGH (ref 70–99)
Glucose-Capillary: 162 mg/dL — ABNORMAL HIGH (ref 70–99)
Glucose-Capillary: 177 mg/dL — ABNORMAL HIGH (ref 70–99)

## 2012-01-22 LAB — CARDIAC PANEL(CRET KIN+CKTOT+MB+TROPI)
CK, MB: 3.3 ng/mL (ref 0.3–4.0)
Relative Index: INVALID (ref 0.0–2.5)
Relative Index: INVALID (ref 0.0–2.5)
Relative Index: INVALID (ref 0.0–2.5)
Total CK: 72 U/L (ref 7–232)
Total CK: 69 U/L (ref 7–232)
Troponin I: <0.3 ng/mL (ref <0.30)
Troponin I: <0.3 ng/mL (ref <0.30)
Troponin I: <0.3 ng/mL (ref <0.30)

## 2012-01-22 LAB — BASIC METABOLIC PANEL
Calcium: 9.7 mg/dL (ref 8.4–10.5)
Creatinine, Ser: 1.03 mg/dL (ref 0.50–1.35)
GFR calc non Af Amer: 85 mL/min — ABNORMAL LOW (ref >90)
Glucose, Bld: 245 mg/dL — ABNORMAL HIGH (ref 70–99)
Sodium: 135 mEq/L (ref 135–145)

## 2012-01-22 LAB — DIFFERENTIAL
Basophils Absolute: 0 10*3/uL (ref 0.0–0.1)
Basophils Relative: 0 % (ref 0–1)
Eosinophils Absolute: 0.1 10*3/uL (ref 0.0–0.7)
Eosinophils Relative: 2 % (ref 0–5)
Lymphocytes Relative: 42 % (ref 12–46)
Lymphs Abs: 4 10*3/uL (ref 0.7–4.0)
Monocytes Absolute: 1 10*3/uL (ref 0.1–1.0)
Monocytes Relative: 10 % (ref 3–12)
Neutro Abs: 4.3 10*3/uL (ref 1.7–7.7)
Neutrophils Relative %: 46 % (ref 43–77)

## 2012-01-22 LAB — POCT I-STAT TROPONIN I: Troponin i, poc: 0.01 ng/mL (ref 0.00–0.08)

## 2012-01-22 LAB — PROTIME-INR
INR: 0.88 (ref 0.00–1.49)
Prothrombin Time: 12.1 seconds (ref 11.6–15.2)

## 2012-01-22 LAB — CREATININE, SERUM
Creatinine, Ser: 0.94 mg/dL (ref 0.50–1.35)
GFR calc non Af Amer: >90 mL/min (ref >90)

## 2012-01-22 SURGERY — LEFT HEART CATHETERIZATION WITH CORONARY ANGIOGRAM
Anesthesia: LOCAL

## 2012-01-22 MED ORDER — INSULIN ASPART 100 UNIT/ML ~~LOC~~ SOLN
0.0000 [IU] | Freq: Three times a day (TID) | SUBCUTANEOUS | Status: DC
  Administered 2012-01-22: 3 [IU] via SUBCUTANEOUS
  Administered 2012-01-22: 5 [IU] via SUBCUTANEOUS
  Administered 2012-01-22: 3 [IU] via SUBCUTANEOUS
  Administered 2012-01-23: 5 [IU] via SUBCUTANEOUS
  Administered 2012-01-23: 8 [IU] via SUBCUTANEOUS
  Administered 2012-01-23: 5 [IU] via SUBCUTANEOUS

## 2012-01-22 MED ORDER — PRASUGREL HCL 10 MG PO TABS
10.0000 mg | ORAL_TABLET | Freq: Every day | ORAL | Status: DC
  Administered 2012-01-22 – 2012-01-23 (×2): 10 mg via ORAL
  Filled 2012-01-22 (×2): qty 1

## 2012-01-22 MED ORDER — MIDAZOLAM HCL 2 MG/2ML IJ SOLN
INTRAMUSCULAR | Status: AC
  Filled 2012-01-22: qty 2

## 2012-01-22 MED ORDER — NITROGLYCERIN 2 % TD OINT
0.5000 [in_us] | TOPICAL_OINTMENT | Freq: Once | TRANSDERMAL | Status: AC
  Administered 2012-01-22: 0.5 [in_us] via TOPICAL

## 2012-01-22 MED ORDER — ATORVASTATIN CALCIUM 40 MG PO TABS
40.0000 mg | ORAL_TABLET | Freq: Every day | ORAL | Status: DC
  Filled 2012-01-22: qty 1

## 2012-01-22 MED ORDER — ASPIRIN 300 MG RE SUPP
300.0000 mg | RECTAL | Status: AC
  Filled 2012-01-22: qty 1

## 2012-01-22 MED ORDER — ASPIRIN 81 MG PO CHEW: 324.0000 mg | CHEWABLE_TABLET | ORAL | Status: AC

## 2012-01-22 MED ORDER — ACETAMINOPHEN 325 MG PO TABS
650.0000 mg | ORAL_TABLET | ORAL | Status: DC | PRN
  Administered 2012-01-23: 650 mg via ORAL
  Filled 2012-01-22: qty 2

## 2012-01-22 MED ORDER — GEMFIBROZIL 600 MG PO TABS
600.0000 mg | ORAL_TABLET | Freq: Two times a day (BID) | ORAL | Status: DC
  Administered 2012-01-22 – 2012-01-23 (×4): 600 mg via ORAL
  Filled 2012-01-22 (×5): qty 1

## 2012-01-22 MED ORDER — HEPARIN (PORCINE) IN NACL 2-0.9 UNIT/ML-% IJ SOLN
INTRAMUSCULAR | Status: AC
  Filled 2012-01-22: qty 2000

## 2012-01-22 MED ORDER — ACETAMINOPHEN 325 MG PO TABS: 650.0000 mg | ORAL_TABLET | ORAL | Status: DC | PRN

## 2012-01-22 MED ORDER — HEPARIN SODIUM (PORCINE) 5000 UNIT/ML IJ SOLN
5000.0000 [IU] | Freq: Three times a day (TID) | INTRAMUSCULAR | Status: DC
  Administered 2012-01-22 – 2012-01-23 (×5): 5000 [IU] via SUBCUTANEOUS
  Filled 2012-01-22 (×7): qty 1

## 2012-01-22 MED ORDER — LIDOCAINE HCL (PF) 1 % IJ SOLN
INTRAMUSCULAR | Status: AC
  Filled 2012-01-22: qty 30

## 2012-01-22 MED ORDER — NITROGLYCERIN 0.2 MG/ML ON CALL CATH LAB
INTRAVENOUS | Status: AC
  Filled 2012-01-22: qty 1

## 2012-01-22 MED ORDER — SODIUM CHLORIDE 0.9 % IV SOLN: INTRAVENOUS | Status: AC

## 2012-01-22 MED ORDER — FUROSEMIDE 10 MG/ML IJ SOLN
40.0000 mg | Freq: Once | INTRAMUSCULAR | Status: AC
  Administered 2012-01-22: 40 mg via INTRAVENOUS
  Filled 2012-01-22: qty 4

## 2012-01-22 MED ORDER — METOPROLOL SUCCINATE ER 25 MG PO TB24
25.0000 mg | ORAL_TABLET | Freq: Every day | ORAL | Status: DC
  Administered 2012-01-22: 25 mg via ORAL
  Filled 2012-01-22: qty 1

## 2012-01-22 MED ORDER — GABAPENTIN 400 MG PO CAPS
400.0000 mg | ORAL_CAPSULE | Freq: Three times a day (TID) | ORAL | Status: DC
  Administered 2012-01-22 – 2012-01-23 (×5): 400 mg via ORAL
  Filled 2012-01-22 (×6): qty 1

## 2012-01-22 MED ORDER — FENTANYL CITRATE 0.05 MG/ML IJ SOLN
INTRAMUSCULAR | Status: AC
  Filled 2012-01-22: qty 2

## 2012-01-22 MED ORDER — TRAZODONE HCL 50 MG PO TABS
50.0000 mg | ORAL_TABLET | Freq: Every day | ORAL | Status: DC
  Administered 2012-01-22: 50 mg via ORAL
  Filled 2012-01-22 (×2): qty 1

## 2012-01-22 MED ORDER — ROSUVASTATIN CALCIUM 20 MG PO TABS
20.0000 mg | ORAL_TABLET | Freq: Every day | ORAL | Status: DC
  Administered 2012-01-22 – 2012-01-23 (×2): 20 mg via ORAL
  Filled 2012-01-22 (×3): qty 1

## 2012-01-22 MED ORDER — ONDANSETRON HCL 4 MG/2ML IJ SOLN
4.0000 mg | Freq: Once | INTRAMUSCULAR | Status: AC
  Administered 2012-01-22: 4 mg via INTRAVENOUS

## 2012-01-22 MED ORDER — NITROGLYCERIN 0.4 MG SL SUBL: 0.4000 mg | SUBLINGUAL_TABLET | SUBLINGUAL | Status: DC | PRN

## 2012-01-22 MED ORDER — ONDANSETRON HCL 4 MG/2ML IJ SOLN: 4.0000 mg | Freq: Four times a day (QID) | INTRAMUSCULAR | Status: DC | PRN

## 2012-01-22 MED ORDER — INSULIN GLARGINE 100 UNIT/ML ~~LOC~~ SOLN
40.0000 [IU] | Freq: Every day | SUBCUTANEOUS | Status: DC
  Administered 2012-01-22: 40 [IU] via SUBCUTANEOUS

## 2012-01-22 MED ORDER — CITALOPRAM HYDROBROMIDE 20 MG PO TABS
20.0000 mg | ORAL_TABLET | Freq: Every day | ORAL | Status: DC
  Administered 2012-01-22 – 2012-01-23 (×2): 20 mg via ORAL
  Filled 2012-01-22 (×2): qty 1

## 2012-01-22 MED ORDER — ASPIRIN 325 MG PO TABS
325.0000 mg | ORAL_TABLET | Freq: Every day | ORAL | Status: DC
Start: 2012-01-22 — End: 2012-01-22
  Administered 2012-01-22: 325 mg via ORAL
  Filled 2012-01-22: qty 1

## 2012-01-22 MED ORDER — ASPIRIN EC 81 MG PO TBEC
81.0000 mg | DELAYED_RELEASE_TABLET | Freq: Every day | ORAL | Status: DC
  Administered 2012-01-23: 81 mg via ORAL
  Filled 2012-01-22: qty 1

## 2012-01-22 MED ORDER — METOPROLOL SUCCINATE ER 25 MG PO TB24
25.0000 mg | ORAL_TABLET | Freq: Two times a day (BID) | ORAL | Status: DC
  Administered 2012-01-22 – 2012-01-23 (×2): 25 mg via ORAL
  Filled 2012-01-22 (×4): qty 1

## 2012-01-22 MED ORDER — NITROGLYCERIN 2 % TD OINT
TOPICAL_OINTMENT | TRANSDERMAL | Status: AC
  Filled 2012-01-22: qty 1

## 2012-01-22 NOTE — ED Notes (Signed)
PT called EMS with sudden onset for 45 minutes chest pain- described as chocking and radiating in strenum with nausea and diaphoresis. Chest pain and now gone. EMS gave 8 morphine and 3 SL nitro and aspirin. Pt became chest pain free. Pt does report SOB. PT has stents placed by [facility] cardiology.

## 2012-01-22 NOTE — ED Notes (Signed)
4,000 unit heparin bolus given IV push at this time.

## 2012-01-22 NOTE — ED Notes (Signed)
X-ray at bedside

## 2012-01-22 NOTE — ED Provider Notes (Signed)
History     CSN: [id number]  Arrival date & time 01/22/12  5784   First MD Initiated Contact with Patient 01/22/12 0037      Chief Complaint  Patient presents with  . Code STEMI    (Consider location/radiation/quality/duration/timing/severity/associated sxs/prior treatment) Patient is a 46 y.o. male presenting with chest pain. The history is provided by the patient. No language interpreter was used.  Chest Pain The chest pain began 1 - 2 hours ago. Chest pain occurs constantly. The chest pain is resolved. The pain is associated with breathing. At its most intense, the pain is at 10/10. The pain is currently at 0/10. The severity of the pain is severe. The quality of the pain is described as squeezing. The pain does not radiate. Exacerbated by: nothing. Primary symptoms include shortness of breath and nausea.  The shortness of breath began today. The shortness of breath developed suddenly. The shortness of breath is severe. The patient's medical history does not include chronic lung disease.  Associated symptoms include diaphoresis. He tried nitroglycerin and aspirin for the symptoms.     Past Medical History  Diagnosis Date  . Diabetes mellitus   . Hyperlipidemia   . Depression   . Coronary artery disease     99% ostial ramus.  s/p PCI with BMS 10/2010    No past surgical history on file.  No family history on file.  History  Substance Use Topics  . Smoking status: Current Some Day Smoker -- 1.0 packs/day    Types: Cigarettes  . Smokeless tobacco: Never Used  . Alcohol Use: No      Review of Systems  Constitutional: Positive for diaphoresis.  HENT: Negative.   Eyes: Negative.   Respiratory: Positive for shortness of breath.   Cardiovascular: Positive for chest pain.  Gastrointestinal: Positive for nausea.  Genitourinary: Negative.   Musculoskeletal: Negative.   Neurological: Negative.   Hematological: Negative.   Psychiatric/Behavioral: Negative.      Allergies  Review of patient's allergies indicates no known allergies.  Home Medications   Current Outpatient Rx  Name Route Sig Dispense Refill  . ASPIRIN 325 MG PO TABS Oral Take 325 mg by mouth daily.      [name] CITALOPRAM HYDROBROMIDE 40 MG PO TABS Oral Take 20 mg by mouth daily.     [name] GABAPENTIN 400 MG PO CAPS Oral Take 400 mg by mouth 3 (three) times daily.      [name] GEMFIBROZIL 600 MG PO TABS Oral Take 600 mg by mouth 2 (two) times daily before a meal.      . INSULIN GLARGINE 100 UNIT/ML [place] SOLN Subcutaneous Inject 40 Units into the skin at bedtime.     [name] LORATADINE 10 MG PO CAPS Oral Take 1 capsule by mouth daily.      [name] METFORMIN HCL 1000 MG PO TABS Oral Take 1,000 mg by mouth 2 (two) times daily with a meal.      . METOPROLOL SUCCINATE ER 25 MG PO TB24 Oral Take 1 tablet (25 mg total) by mouth daily. 30 tablet 12  . NAPROXEN 500 MG PO TABS Oral Take 500 mg by mouth as needed.    [name] NITROGLYCERIN 0.4 MG SL SUBL Sublingual Place 0.4 mg under the tongue every 5 (five) minutes as needed. For chest pain.    [name] PRASUGREL HCL 10 MG PO TABS Oral Take 1 tablet (10 mg total) by mouth daily. 30 tablet 12  . ROSUVASTATIN CALCIUM 10 MG PO TABS Oral  Take 1 tablet (10 mg total) by mouth daily. 30 tablet 12  . TRAZODONE HCL 50 MG PO TABS Oral Take 50 mg by mouth at bedtime.        BP 136/81  Temp(Src) 98.1 F (36.7 C) (Oral)  Resp 22  SpO2 98%  Physical Exam  Constitutional: He is oriented to person, place, and time. He appears well-developed and well-nourished. No distress.  HENT:  Head: Normocephalic and atraumatic.  Mouth/Throat: Oropharynx is clear and moist.  Eyes: Conjunctivae are normal. Pupils are equal, round, and reactive to light.  Neck: Normal range of motion. Neck supple.  Cardiovascular: Normal rate and regular rhythm.   Pulmonary/Chest: Effort normal. No respiratory distress. He has no rales.  Abdominal: Soft. Bowel sounds are normal. There is no tenderness. There is no  rebound and no guarding.  Musculoskeletal: Normal range of motion. He exhibits no edema.  Neurological: He is alert and oriented to person, place, and time.  Skin: Skin is warm and dry.  Psychiatric: He has a normal mood and affect.    ED Course  Procedures (including critical care time)  Labs Reviewed  CBC - Abnormal; Notable for the following:    MCHC 36.3 (*)    All other components within normal limits  POCT I-STAT, CHEM 8 - Abnormal; Notable for the following:    Glucose, Bld 341 (*)    All other components within normal limits  DIFFERENTIAL  PRO B NATRIURETIC PEPTIDE  PROTIME-INR   No results found.   1. STEMI (ST elevation myocardial infarction)       MDM   Date: 01/22/2012  Rate: 94  Rhythm: normal sinus rhythm  QRS Axis: left  Intervals: PR prolonged  ST/T Wave abnormalities: based on Sgabossa criteria concordance in v3 and v4  Conduction Disutrbances:first-degree A-V block LBBB  Narrative Interpretation:   Old EKG Reviewed: changes noted   Based on conconrdance anteriorly in the setting of CP, SOB n/d.  STEMI continued       Hartley Wyke K Saxton Chain-Rasch, MD 01/22/12 [phone]

## 2012-01-22 NOTE — Progress Notes (Signed)
Ambulated patient in hall patient complained of dizziness and needed to sit down in hall on chair. Dizziness resolved patient then ambulated back to bed.

## 2012-01-22 NOTE — Consult Note (Signed)
Pt had a stemi with DM. Smokes 1 ppd and is in contemplation stage about quitting. He had an Mi before according to his wife but relapsed after 2 1/2 months due to stress. Recommended the patch to pt, 21 mg to start with. Discussed patch use instructions and how to taper. Referred to 1-800 quit now for f/u and support. Discussed oral fixation substitutes, second hand smoke and in home smoking policy. Reviewed and gave pt Written education/contact information.

## 2012-01-22 NOTE — ED Notes (Signed)
PT transported to cath lab and report given to RN. Chaplain with spouse.

## 2012-01-22 NOTE — H&P (Signed)
Admit date: 01/22/2012 Name:  Dustin Cruz Medical record number: [id number] DOB/Age:  [birth year]  45 y.o.  Referring Physician:   [facility]  Primary Cardiologist: Dr. Dietrich Pates  Chief complaint/reason for admission:  Chest pain  HPI:  This 46 year old male has a history of diabetes mellitus, hyperlipidemia and depression and has coronary artery disease. He presented with prolonged chest discomfort and was found to have a 99% ramus stenosis that was stented with a 2.5 mm bare-metal stent by Dr. Rosalie Gums 10/10/2010. He has continued to smoke somnolent but has been compliant with his medications. He has not felt well today and felt somewhat weekend around 7:30 PM had the onset of midsternal chest discomfort and was transported here by EMS. The discomfort last around 45 minutes and was relieved with nitroglycerin and some morphine. His pain abated by the time he was here. He has had some vague chest discomfort otherwise.   Past Medical History  Diagnosis Date  . Diabetes mellitus   . Hyperlipidemia   . Depression   . Coronary artery disease     99% ostial ramus.  s/p PCI with BMS 10/2010      No past surgical history on file..  Allergies:  has no known allergies.   Medications: Prior to Admission medications   Medication Sig Start Date End Date Taking? Authorizing Provider  aspirin 325 MG tablet Take 325 mg by mouth daily.     Yes Historical Provider, MD  citalopram (CELEXA) 40 MG tablet Take 20 mg by mouth daily.    Yes Historical Provider, MD  gabapentin (NEURONTIN) 400 MG capsule Take 400 mg by mouth 3 (three) times daily.     Yes Historical Provider, MD  gemfibrozil (LOPID) 600 MG tablet Take 600 mg by mouth 2 (two) times daily before a meal.     Yes Historical Provider, MD  insulin glargine (LANTUS SOLOSTAR) 100 UNIT/ML injection Inject 40 Units into the skin at bedtime.    Yes Historical Provider, MD  Loratadine 10 MG CAPS Take 1 capsule by mouth daily.     Yes  Historical Provider, MD  metFORMIN (GLUCOPHAGE) 1000 MG tablet Take 1,000 mg by mouth 2 (two) times daily with a meal.     Yes Historical Provider, MD  metoprolol succinate (TOPROL-XL) 25 MG 24 hr tablet Take 1 tablet (25 mg total) by mouth daily. 12/27/11  Yes Jodelle Gross, NP  naproxen (NAPROSYN) 500 MG tablet Take 500 mg by mouth as needed.   Yes Historical Provider, MD  nitroGLYCERIN (NITROSTAT) 0.4 MG SL tablet Place 0.4 mg under the tongue every 5 (five) minutes as needed. For chest pain.   Yes Historical Provider, MD  prasugrel (EFFIENT) 10 MG TABS Take 1 tablet (10 mg total) by mouth daily. 12/27/11  Yes Jodelle Gross, NP  rosuvastatin (CRESTOR) 10 MG tablet Take 1 tablet (10 mg total) by mouth daily. 12/27/11  Yes Jodelle Gross, NP  traZODone (DESYREL) 50 MG tablet Take 50 mg by mouth at bedtime.     Yes Historical Provider, MD  .  Family History:   indicated that his mother is deceased. He indicated that his father is deceased. He indicated that both of his brothers are alive.   Social History:   reports that he has been smoking Cigarettes.  He has been smoking about 1 Krieger per day. He has never used smokeless tobacco. He reports that he does not drink alcohol or use illicit drugs.   History  Social History Narrative  . No narrative on file     Review of Systems: Other than as noted above, the remainder of the review of systems is normal  Physical Exam: BP 136/81  Temp(Src) 98.1 F (36.7 C) (Oral)  Resp 22  SpO2 98% General appearance: alert, cooperative, appears stated age, no distress and moderately obese Head: Normocephalic, without obvious abnormality, atraumatic Eyes: Conjunctiva is red Neck: no adenopathy, no carotid bruit, no JVD and supple, symmetrical, trachea midline Lungs: clear to auscultation bilaterally Heart: regular rate and rhythm, S1, S2 normal, no murmur, click, rub or gallop Abdomen: soft, non-tender; bowel sounds normal; no masses,  no  organomegaly Rectal: deferred Extremities: extremities normal, atraumatic, no cyanosis or edema Pulses: 2+ and symmetric Skin: Skin color, texture, turgor normal. No rashes or lesions   Labs: Results for orders placed during the hospital encounter of 01/22/12 (from the past 24 hour(s))  CBC     Status: Abnormal   Collection Time   01/22/12 12:41 AM      Component Value Range   WBC 9.4  4.0 - 10.5 (K/uL)   RBC 4.62  4.22 - 5.81 (MIL/uL)   Hemoglobin 15.3  13.0 - 17.0 (g/dL)   HCT 19.1  47.8 - 29.5 (%)   MCV 91.1  78.0 - 100.0 (fL)   MCH 33.1  26.0 - 34.0 (pg)   MCHC 36.3 (*) 30.0 - 36.0 (g/dL)   RDW 62.1  30.8 - 65.7 (%)   Platelets 380  150 - 400 (K/uL)  DIFFERENTIAL     Status: Normal   Collection Time   01/22/12 12:41 AM      Component Value Range   Neutrophils Relative 46  43 - 77 (%)   Neutro Abs 4.3  1.7 - 7.7 (K/uL)   Lymphocytes Relative 42  12 - 46 (%)   Lymphs Abs 4.0  0.7 - 4.0 (K/uL)   Monocytes Relative 10  3 - 12 (%)   Monocytes Absolute 1.0  0.1 - 1.0 (K/uL)   Eosinophils Relative 2  0 - 5 (%)   Eosinophils Absolute 0.1  0.0 - 0.7 (K/uL)   Basophils Relative 0  0 - 1 (%)   Basophils Absolute 0.0  0.0 - 0.1 (K/uL)  PROTIME-INR     Status: Normal   Collection Time   01/22/12 12:41 AM      Component Value Range   Prothrombin Time 12.1  11.6 - 15.2 (seconds)   INR 0.88  0.00 - 1.49   POCT I-STAT TROPONIN I     Status: Normal   Collection Time   01/22/12 12:46 AM      Component Value Range   Troponin i, poc 0.01  0.00 - 0.08 (ng/mL)   Comment 3           POCT I-STAT, CHEM 8     Status: Abnormal   Collection Time   01/22/12 12:47 AM      Component Value Range   Sodium 138  135 - 145 (mEq/L)   Potassium 4.3  3.5 - 5.1 (mEq/L)   Chloride 104  96 - 112 (mEq/L)   BUN 23  6 - 23 (mg/dL)   Creatinine, Ser 8.46  0.50 - 1.35 (mg/dL)   Glucose, Bld 962 (*) 70 - 99 (mg/dL)   Calcium, Ion 9.52  8.41 - 1.32 (mmol/L)   TCO2 25  0 - 100 (mmol/L)   Hemoglobin 15.3   13.0 - 17.0 (g/dL)   HCT 45.0  39.0 - 52.0 (%)    EKG: Left bundle branch block unchanged from before  Radiology: Atelectasis   IMPRESSIONS: 1. Prolonged chest discomfort in a patient with known coronary artery disease 2. Previous coronary stent 3. Coronary artery disease 4. Obesity 5. Ongoing tobacco abuse 6. Insulin-dependent diabetes mellitus 7. Depression  PLAN: He'll be taken to the cardiac catheterization laboratory for acute intervention. He is currently pain-free. Procedure discussed with patient including risks and he is agreeable.   Signed: Darden Palmer MD [facility] Cardiology  01/22/2012, 1:23 AM

## 2012-01-22 NOTE — Progress Notes (Signed)
Subjective: NO CP.  Wife says he just gives out when he does things. Objective: Filed Vitals:   01/22/12 0400 01/22/12 0500 01/22/12 0600 01/22/12 1021  BP: 116/70 121/73 115/74 113/76  Pulse: 77 77 81 79  Temp:      TempSrc:      Resp:      Height:      Weight:      SpO2:  98%     Weight change:   Intake/Output Summary (Last 24 hours) at 01/22/12 1042 Last data filed at 01/22/12 0900  Gross per 24 hour  Intake    360 ml  Output      0 ml  Net    360 ml    General: Alert, awake, oriented x3, in no acute distress Neck:  JVP is normal Heart: Regular rate and rhythm, without murmurs, rubs, gallops.  Lungs: Moving air  Wheezes bilateral  R > L. Exemities:  No edema.   Neuro: Grossly intact, nonfocal.   Lab Results: Results for orders placed during the hospital encounter of 01/22/12 (from the past 24 hour(s))  CBC     Status: Abnormal   Collection Time   01/22/12 12:41 AM      Component Value Range   WBC 9.4  4.0 - 10.5 (K/uL)   RBC 4.62  4.22 - 5.81 (MIL/uL)   Hemoglobin 15.3  13.0 - 17.0 (g/dL)   HCT 40.9  81.1 - 91.4 (%)   MCV 91.1  78.0 - 100.0 (fL)   MCH 33.1  26.0 - 34.0 (pg)   MCHC 36.3 (*) 30.0 - 36.0 (g/dL)   RDW 78.2  95.6 - 21.3 (%)   Platelets 380  150 - 400 (K/uL)  DIFFERENTIAL     Status: Normal   Collection Time   01/22/12 12:41 AM      Component Value Range   Neutrophils Relative 46  43 - 77 (%)   Neutro Abs 4.3  1.7 - 7.7 (K/uL)   Lymphocytes Relative 42  12 - 46 (%)   Lymphs Abs 4.0  0.7 - 4.0 (K/uL)   Monocytes Relative 10  3 - 12 (%)   Monocytes Absolute 1.0  0.1 - 1.0 (K/uL)   Eosinophils Relative 2  0 - 5 (%)   Eosinophils Absolute 0.1  0.0 - 0.7 (K/uL)   Basophils Relative 0  0 - 1 (%)   Basophils Absolute 0.0  0.0 - 0.1 (K/uL)  PRO B NATRIURETIC PEPTIDE     Status: Normal   Collection Time   01/22/12 12:41 AM      Component Value Range   Pro B Natriuretic peptide (BNP) 101.3  0 - 125 (pg/mL)  PROTIME-INR     Status: Normal   Collection Time   01/22/12 12:41 AM      Component Value Range   Prothrombin Time 12.1  11.6 - 15.2 (seconds)   INR 0.88  0.00 - 1.49   POCT I-STAT TROPONIN I     Status: Normal   Collection Time   01/22/12 12:46 AM      Component Value Range   Troponin i, poc 0.01  0.00 - 0.08 (ng/mL)   Comment 3           POCT I-STAT, CHEM 8     Status: Abnormal   Collection Time   01/22/12 12:47 AM      Component Value Range   Sodium 138  135 - 145 (mEq/L)   Potassium 4.3  3.5 -  5.1 (mEq/L)   Chloride 104  96 - 112 (mEq/L)   BUN 23  6 - 23 (mg/dL)   Creatinine, Ser 8.46  0.50 - 1.35 (mg/dL)   Glucose, Bld 962 (*) 70 - 99 (mg/dL)   Calcium, Ion 9.52  8.41 - 1.32 (mmol/L)   TCO2 25  0 - 100 (mmol/L)   Hemoglobin 15.3  13.0 - 17.0 (g/dL)   HCT 32.4  40.1 - 02.7 (%)  GLUCOSE, CAPILLARY     Status: Abnormal   Collection Time   01/22/12  2:18 AM      Component Value Range   Glucose-Capillary 290 (*) 70 - 99 (mg/dL)  CBC     Status: Normal   Collection Time   01/22/12  3:00 AM      Component Value Range   WBC 7.6  4.0 - 10.5 (K/uL)   RBC 4.39  4.22 - 5.81 (MIL/uL)   Hemoglobin 14.0  13.0 - 17.0 (g/dL)   HCT 25.3  66.4 - 40.3 (%)   MCV 92.3  78.0 - 100.0 (fL)   MCH 31.9  26.0 - 34.0 (pg)   MCHC 34.6  30.0 - 36.0 (g/dL)   RDW 47.4  25.9 - 56.3 (%)   Platelets 369  150 - 400 (K/uL)  CREATININE, SERUM     Status: Normal   Collection Time   01/22/12  3:00 AM      Component Value Range   Creatinine, Ser 0.94  0.50 - 1.35 (mg/dL)   GFR calc non Af Amer >90  >90 (mL/min)   GFR calc Af Amer >90  >90 (mL/min)  CARDIAC PANEL(CRET KIN+CKTOT+MB+TROPI)     Status: Normal   Collection Time   01/22/12  3:12 AM      Component Value Range   Total CK 86  7 - 232 (U/L)   CK, MB 3.5  0.3 - 4.0 (ng/mL)   Troponin I <0.30  <0.30 (ng/mL)   Relative Index RELATIVE INDEX IS INVALID  0.0 - 2.5   GLUCOSE, CAPILLARY     Status: Abnormal   Collection Time   01/22/12  7:45 AM      Component Value Range    Glucose-Capillary 162 (*) 70 - 99 (mg/dL)  CARDIAC PANEL(CRET KIN+CKTOT+MB+TROPI)     Status: Normal   Collection Time   01/22/12  8:26 AM      Component Value Range   Total CK 72  7 - 232 (U/L)   CK, MB 3.3  0.3 - 4.0 (ng/mL)   Troponin I <0.30  <0.30 (ng/mL)   Relative Index RELATIVE INDEX IS INVALID  0.0 - 2.5     Studies/Results: Dg Chest Portable 1 View  01/22/2012  *RADIOLOGY REPORT*  Clinical Data: Code ST-elevation myocardial infarction; chest pain.  PORTABLE CHEST - 1 VIEW  Comparison: None.  Findings: The lungs are well-aerated.  Minimal right basilar opacity likely reflects atelectasis.  There is no evidence of focal opacification, pleural effusion or pneumothorax.  The cardiomediastinal silhouette is within normal limits.  No acute osseous abnormalities are seen.  IMPRESSION: Minimal right basilar opacity likely reflects atelectasis; lungs otherwise clear.  Original Report Authenticated By: Tonia Ghent, M.D.    Medications: I have reviewed the patient's current medications.   Patient Active Hospital Problem List: CAD (coronary artery disease), native coronary artery (01/06/2011)   Assessment: Cath last night showed moderate to severe instent restenosis in ramus  Small vessel  Plan for medical Rx.  Note that LVEDP was increased  Will give 1 dose IV lasix.  Increase B BLocker to bid.  Came in on prasugrell.   Ambulate an follow.  Insulin dependent type 2 diabetes mellitus, controlled ()   Assessment:    Plan:  Dyslipidemia:  Continue lopid and lipitor.  Dyspnea:  Wheezing not there by report yesterday.  LAsix x 1.  Follow.  Counselled on tobacco.     LOS: 0 days   Dietrich Pates 01/22/2012, 10:42 AM

## 2012-01-22 NOTE — CV Procedure (Signed)
Cardiac Cath Procedure Note:  Indication: Ongoing CP/LBBB  Procedures performed:  1) Selective coronary angiography 2) Left heart catheterization 3) Left ventriculogram  Description of procedure:   The risks and indication of the procedure were explained. Consent was signed and placed on the chart. An appropriate timeout was taken prior to the procedure. The right groin was prepped and draped in the routine sterile fashion and anesthetized with 1% local lidocaine.   A 5 FR arterial sheath was placed in the right femoral artery using a modified Seldinger technique. Standard catheters including a JL4, JR4 and angled pigtail were used. All catheter exchanges were made over a wire.  Complications:  None apparent  Findings:  Ao Pressure: 123/87 (101) LV Pressure:  130/23/25 There was no signficant gradient across the aortic valve on pullback.  Left main: Normal  LAD: Prox 30-40% Mild diffuse plaquing throughout. Small bridging section in midsection. Large branching diagonal that came off ostium of LAD. Mild plaque in ostium.  LCX: Gave off small to moderate sized ramus and two small OMs. In ostium of ramus there was a BMS with 70-80% hazy in-stent restenosis. Otherwise mild plaquing in the LCX  RCA:  Dominant vessel with PDA and PL.Two 30% lesions in midsection. Mild plaque in PDA.  LV-gram done in the RAO projection: Ejection fraction = 60% No regional wall motion abnormalities.  Assessment:  1) Mostly mild non-obstructive CAD. There is moderate to severe ISR in the ostium of the small ramus. The vessel has TIMI-3 flow 2) Normal LV function  Plan/Discussion:  Given small size of ramus, I do not feel that benefits of trying to re-angioplasty the ostial ramus outweigh the risks of compromising the LAD and large diagonal branch. Will treat medically. Reviewed with Dr. Riley Kill.  Dustin Cruz 1:44 AM

## 2012-01-22 NOTE — ED Notes (Addendum)
Zoll pads placed on patient upon arrival.

## 2012-01-23 DIAGNOSIS — R079 Chest pain, unspecified: Secondary | ICD-10-CM

## 2012-01-23 LAB — GLUCOSE, CAPILLARY: Glucose-Capillary: 236 mg/dL — ABNORMAL HIGH (ref 70–99)

## 2012-01-23 MED ORDER — ALBUTEROL SULFATE (5 MG/ML) 0.5% IN NEBU
2.5000 mg | INHALATION_SOLUTION | RESPIRATORY_TRACT | Status: DC | PRN
  Administered 2012-01-23: 2.5 mg via RESPIRATORY_TRACT
  Filled 2012-01-23: qty 0.5

## 2012-01-23 MED ORDER — NITROGLYCERIN 0.4 MG SL SUBL: 0.4000 mg | SUBLINGUAL_TABLET | SUBLINGUAL | Status: DC | PRN

## 2012-01-23 MED ORDER — METFORMIN HCL 1000 MG PO TABS: 1000.0000 mg | ORAL_TABLET | Freq: Two times a day (BID) | ORAL | Status: DC

## 2012-01-23 MED ORDER — ROSUVASTATIN CALCIUM 20 MG PO TABS: 20.0000 mg | ORAL_TABLET | Freq: Every day | ORAL | Status: DC

## 2012-01-23 NOTE — Discharge Summary (Signed)
CARDIOLOGY DISCHARGE SUMMARY   Patient ID: Dustin Cruz MRN: [id number] DOB/AGE: 12-20-1965 46 y.o.  Admit date: 01/22/2012 Discharge date: 01/23/2012  Primary Discharge Diagnosis: Chest pain Secondary Discharge Diagnosis:  Patient Active Problem List  Diagnoses  . CAD (coronary artery disease), native coronary artery, s/p BMS RI, Jan 2012  . Hypercholesterolemia  . Obesity (BMI 30-39.9)  . LBBB (left bundle branch block)  . Insulin dependent type 2 diabetes mellitus, controlled  . Depression   Procedures:  1) Selective coronary angiography  2) Left heart catheterization  3) Left ventriculogram  Hospital Course: Mr Vincelette is a 46 year old male with a history of CAD. He came to the hospital with chest pain and was admitted for further evaluation and treatment.    His cardiac enzymes were negative for MI. He had a cardiac cath on 4/15 with results listed below. Dr Gala Romney reviewed the results with Dr Riley Kill: Given small size of ramus, I do not feel that benefits of trying to re-angioplasty the ostial ramus outweigh the risks of compromising the LAD and large diagonal branch. Will treat medically.   Mr Mucci was seen by smoking cessation and by someone from the Diabetes Program. Information on outpatient diabetes classes was given and he is encouraged to stick to a heart-healthy diabetic diet.   On 4/16, Mr Wagenaar was seen by Dr Ladona Ridgel. He is ambulating without chest pain or SOB and considered stable for discharge, to follow up in [place].  Labs:   Lab Results  Component Value Date   WBC 7.6 01/22/2012   HGB 14.0 01/22/2012   HCT 40.5 01/22/2012   MCV 92.3 01/22/2012   PLT 369 01/22/2012    Lab 01/22/12 1309  NA 135  K 4.4  CL 98  CO2 24  BUN 22  CREATININE 1.03  CALCIUM 9.7  PROT --  BILITOT --  ALKPHOS --  ALT --  AST --  GLUCOSE 245*    Basename 01/22/12 1309 01/22/12 0826 01/22/12 0312  CKTOTAL 69 72 86  CKMB 3.3 3.3 3.5  CKMBINDEX -- -- --  TROPONINI <0.30  <0.30 <0.30    Pro B Natriuretic peptide (BNP)  Date/Time Value Range Status  01/22/2012 12:41 AM 101.3  0-125 (pg/mL) Final  10/12/2010  3:49 AM 102.0* 0.0-100.0 (pg/mL) Final    Basename 01/22/12 0041  INR 0.88   Lab Results  Component Value Date   HGBA1C 9.1* 01/22/2012     Radiology:  Dg Chest Portable 1 View 01/22/2012  *RADIOLOGY REPORT*  Clinical Data: Code ST-elevation myocardial infarction; chest pain.  PORTABLE CHEST - 1 VIEW  Comparison: None.  Findings: The lungs are well-aerated.  Minimal right basilar opacity likely reflects atelectasis.  There is no evidence of focal opacification, pleural effusion or pneumothorax.  The cardiomediastinal silhouette is within normal limits.  No acute osseous abnormalities are seen.  IMPRESSION: Minimal right basilar opacity likely reflects atelectasis; lungs otherwise clear.  Original Report Authenticated By: Tonia Ghent, M.D.    Cardiac Cath: Left main: Normal  LAD: Prox 30-40% Mild diffuse plaquing throughout. Small bridging section in midsection. Large branching diagonal that came off ostium of LAD. Mild plaque in ostium.  LCX: Gave off small to moderate sized ramus and two small OMs. In ostium of ramus there was a BMS with 70-80% hazy in-stent restenosis. Otherwise mild plaquing in the LCX  RCA: Dominant vessel with PDA and PL.Two 30% lesions in midsection. Mild plaque in PDA.  LV-gram done in the RAO projection:  Ejection fraction = 60% No regional wall motion abnormalities.  EKG: 22-Jan-2012 06:52:53  Normal sinus rhythm Left bundle branch block Abnormal ECG Vent. rate 81 BPM PR interval 200 ms QRS duration 136 ms QT/QTc 400/464 ms P-R-T axes 76 4 150   FOLLOW UP PLANS AND APPOINTMENTS Discharge Orders    Future Appointments: Provider: Department: Dept Phone: Center:   02/06/2012 11:20 AM Jodelle Gross, NP [facility] [phone] [facility]     No Known Allergies Medication List  As of 01/23/2012  4:09 PM    TAKE these medications         aspirin 325 MG tablet   Take 325 mg by mouth daily.      citalopram 40 MG tablet   Commonly known as: CELEXA   Take 20 mg by mouth daily.      gabapentin 400 MG capsule   Commonly known as: NEURONTIN   Take 400 mg by mouth 3 (three) times daily.      gemfibrozil 600 MG tablet   Commonly known as: LOPID   Take 600 mg by mouth 2 (two) times daily before a meal.      LANTUS SOLOSTAR 100 UNIT/ML injection   Generic drug: insulin glargine   Inject 40 Units into the skin at bedtime.      Loratadine 10 MG Caps   Take 1 capsule by mouth daily.      metFORMIN 1000 MG tablet   Commonly known as: GLUCOPHAGE   Take 1 tablet (1,000 mg total) by mouth 2 (two) times daily with a meal. HOLD for 48 hours, restart on April 18th.      metoprolol succinate 25 MG 24 hr tablet   Commonly known as: TOPROL-XL   Take 1 tablet (25 mg total) by mouth daily.      naproxen 500 MG tablet   Commonly known as: NAPROSYN   Take 500 mg by mouth as needed.      nitroGLYCERIN 0.4 MG SL tablet   Commonly known as: NITROSTAT   Place 0.4 mg under the tongue every 5 (five) minutes as needed. For chest pain.      prasugrel 10 MG Tabs   Commonly known as: EFFIENT   Take 1 tablet (10 mg total) by mouth daily.      rosuvastatin 20 MG tablet   Commonly known as: CRESTOR   Take 1 tablet (20 mg total) by mouth daily.      traZODone 50 MG tablet   Commonly known as: DESYREL   Take 50 mg by mouth at bedtime.           Follow-up Information    Follow up with Joni Reining, NP. (April 30th at 11:20 am)    Contact information:   [address] [place] [address] [phone]      Follow up at [facility]    BRING ALL MEDICATIONS WITH YOU TO FOLLOW UP APPOINTMENTS  Time spent with patient to include physician time: 35 min Signed: Theodore Demark 01/23/2012, 4:09 PM Co-Sign MD

## 2012-01-23 NOTE — Discharge Instructions (Addendum)
NO HEAVY LIFTING OR SEXUAL ACTIVITY X 7 DAYS. NO DRIVING X 2 DAYS. NO SOAKING BATHS, HOT TUBS, POOLS, ETC., X 5 DAYS.  Follow up at [facility]   [facility]  Free Diabetes Classes: Tuesday 10:00 am and Thursday 6:30 pm. Please call ahead for registration @ [phone]

## 2012-01-23 NOTE — Progress Notes (Signed)
Utilization review completed. Rayvion Stumph, RN, BSN. 01/23/12  

## 2012-01-23 NOTE — Progress Notes (Signed)
Inpatient Diabetes Program Recommendations  AACE/ADA: New Consensus Statement on Inpatient Glycemic Control (2009)  Target Ranges:  Prepandial:   less than 140 mg/dL      Peak postprandial:   less than 180 mg/dL (1-2 hours)      Critically ill patients:  140 - 180 mg/dL   Reason for Visit: Hyperglycemia and elevated Hgb A1C  Inpatient Diabetes Program Recommendations Insulin - Basal: Patient's PCP with [facility] in [place] in [place].  Recently Lantus was increased to 50 units at bedtime Insulin - Meal Coverage: Patient probably needs meal coverage to improve glycemic control-- but reluctant to commit to adding injections of rapid-acting insulin with each meal. HgbA1C: Hgb A1C 9.1-- indicating sub-optimal glycemic control with avg glucose of 214 mg/dl Outpatient Referral: A free diabetes education class is available at [facility] for [facility] residents.  Gave patient information so he can register and suggested he attend as a  way to determine if there are behavior-changes patient can make to improve glycemic control.  Note:  Instructed patient regarding how to access Patient Education Channel and watch instructional videos regarding diabetes and heart disease.  Stress need to improve glycemic control at home especially given heart disease.  Tyrone Pautsch S. Elsie Lincoln, RN, CNS, CDE  [phone])

## 2012-01-23 NOTE — Progress Notes (Signed)
Patient ID: Dustin Cruz, male   DOB: Nov 05, 1965, 46 y.o.   MRN: [id number] Subjective:  No chest pain or sob.  Objective:  Vital Signs in the last 24 hours: Temp:  [97.7 F (36.5 C)-98.5 F (36.9 C)] 98.5 F (36.9 C) (04/16 1447) Pulse Rate:  [69-83] 79  (04/16 1447) Resp:  [16-18] 16  (04/16 1447) BP: (114-138)/(73-81) 138/81 mmHg (04/16 1447) SpO2:  [95 %-96 %] 96 % (04/16 1447)  Intake/Output from previous day: 04/15 0701 - 04/16 0700 In: 1080 [P.O.:1080] Out: 2600 [Urine:2600] Intake/Output from this shift:    Physical Exam: Well appearing NAD HEENT: Unremarkable Neck:  No JVD, no thyromegally Lymphatics:  No adenopathy Back:  No CVA tenderness Lungs:  Clear HEART:  Regular rate rhythm, no murmurs, no rubs, no clicks Abd:  Flat, positive bowel sounds, no organomegally, no rebound, no guarding Ext:  2 plus pulses, no edema, no cyanosis, no clubbing Skin:  No rashes no nodules Neuro:  CN II through XII intact, motor grossly intact  Lab Results:  Basename 01/22/12 0300 01/22/12 0047 01/22/12 0041  WBC 7.6 -- 9.4  HGB 14.0 15.3 --  PLT 369 -- 380    Basename 01/22/12 1309 01/22/12 0300 01/22/12 0047  NA 135 -- 138  K 4.4 -- 4.3  CL 98 -- 104  CO2 24 -- --  GLUCOSE 245* -- 341*  BUN 22 -- 23  CREATININE 1.03 0.94 --    Basename 01/22/12 1309 01/22/12 0826  TROPONINI <0.30 <0.30   Hepatic Function Panel No results found for this basename: PROT,ALBUMIN,AST,ALT,ALKPHOS,BILITOT,BILIDIR,IBILI in the last 72 hours No results found for this basename: CHOL in the last 72 hours No results found for this basename: PROTIME in the last 72 hours  Imaging: Dg Chest Portable 1 View  01/22/2012  *RADIOLOGY REPORT*  Clinical Data: Code ST-elevation myocardial infarction; chest pain.  PORTABLE CHEST - 1 VIEW  Comparison: None.  Findings: The lungs are well-aerated.  Minimal right basilar opacity likely reflects atelectasis.  There is no evidence of focal opacification, pleural  effusion or pneumothorax.  The cardiomediastinal silhouette is within normal limits.  No acute osseous abnormalities are seen.  IMPRESSION: Minimal right basilar opacity likely reflects atelectasis; lungs otherwise clear.  Original Report Authenticated By: Tonia Ghent, M.D.    Cardiac Studies: Tele - nsr Assessment/Plan:  1. [place] 2. CAD, s/p cath with restenosis of Ramus intermedius with recommendation for medical therapy 3. Elevated left heart pressure Rec: ok to discharge today. I discussed the importance of sodium restriction. He eats fast food, bacon, sausage and ham regularly. He will continue his current meds. He will followup with Dr. Diona [place] in [place].  LOS: 1 day    Lewayne Bunting 01/23/2012, 3:34 PM

## 2012-02-06 ENCOUNTER — Encounter: Payer: Self-pay | Admitting: Adult Health

## 2012-02-06 ENCOUNTER — Ambulatory Visit (INDEPENDENT_AMBULATORY_CARE_PROVIDER_SITE_OTHER): Payer: Medicaid Other | Admitting: Adult Health

## 2012-02-06 VITALS — BP 141/78 | HR 86 | Resp 18 | Ht 74.0 in | Wt 218.0 lb

## 2012-02-06 DIAGNOSIS — E7800 Pure hypercholesterolemia, unspecified: Secondary | ICD-10-CM

## 2012-02-06 DIAGNOSIS — I2510 Atherosclerotic heart disease of native coronary artery without angina pectoris: Secondary | ICD-10-CM

## 2012-02-06 DIAGNOSIS — E119 Type 2 diabetes mellitus without complications: Secondary | ICD-10-CM

## 2012-02-06 NOTE — Assessment & Plan Note (Signed)
Recent cardiac cath demonstrated in-stent restenosis of the ostial ramus, but is to be treated medically per Dr.Stuckey because of the risk of compromising LAD and large diagonal branch. He has stopped smoking but had still not adhered to a low cholesterol diet. He has had no recurrence of chest pain.  I have congratulated him on smoking cessation, and have asked him to be more involved in his health maintenance with better diet and exercise to prevent future cardiac events. He verbalized understanding.

## 2012-02-06 NOTE — Assessment & Plan Note (Signed)
He is re-educated on low cholesterol diet. He will continue crestor 20 mg daily.  Will recheck lipids in 3 months.

## 2012-02-06 NOTE — Progress Notes (Signed)
HPI: [name] is a 46 y/o patient of Dr. Dietrich Pates we are seeing for ongoing assessment and treatment of CAD with BMS to ramus in 10/2009. He is followed by the health dept in [place] for drug assistance and medical management of diabetes, depression/anxiety. He was recently admitted to [facility] hospital with complaints of chest pain on 01/22/2012. He was ruled out for MI. Cardiac catheterization per Dr. Riley Kill, demonstrating prox 30-40% LAD, LCx ramus,had hazy 70-80% in-stent restenosis. EF of .60%. He was elected to be treated medically because of the risks of compromising the LAD and large diagonal branch. He was continued on ASA, Effient and metoprolol with statin.     He has quit smoking for 1 1/2 weeks since discharge and is using an electronic cigarette. He has not adhered to a low cholesterol diet, however, eating sausage biscuits and eggs for breakfast. He is followed at [facility] Health dept for diabetes management. Hbg A!C during admission was 9.1. He has occasional DOE, and fatigue, but no recurrent chest pain.  No Known Allergies  Current Outpatient Prescriptions  Medication Sig Dispense Refill  . aspirin 325 MG tablet Take 325 mg by mouth daily.        . citalopram (CELEXA) 40 MG tablet Take 20 mg by mouth daily.       [name] gabapentin (NEURONTIN) 400 MG capsule Take 400 mg by mouth 3 (three) times daily.        [name] gemfibrozil (LOPID) 600 MG tablet Take 600 mg by mouth 2 (two) times daily before a meal.        . insulin glargine (LANTUS SOLOSTAR) 100 UNIT/ML injection Inject 40 Units into the skin at bedtime.       . Loratadine 10 MG CAPS Take 1 capsule by mouth daily.        . metFORMIN (GLUCOPHAGE) 1000 MG tablet Take 1 tablet (1,000 mg total) by mouth 2 (two) times daily with a meal. HOLD for 48 hours, restart on April 18th.      . metoprolol succinate (TOPROL-XL) 25 MG 24 hr tablet Take 1 tablet (25 mg total) by mouth daily.  30 tablet  12  . naproxen (NAPROSYN) 500 MG tablet Take  500 mg by mouth as needed.      . nitroGLYCERIN (NITROSTAT) 0.4 MG SL tablet Place 1 tablet (0.4 mg total) under the tongue every 5 (five) minutes as needed. For chest pain.  25 tablet  3  . prasugrel (EFFIENT) 10 MG TABS Take 1 tablet (10 mg total) by mouth daily.  30 tablet  12  . rosuvastatin (CRESTOR) 20 MG tablet Take 1 tablet (20 mg total) by mouth daily.  30 tablet  12  . traZODone (DESYREL) 50 MG tablet Take 50 mg by mouth at bedtime.        [name] DISCONTD: glipiZIDE (GLUCOTROL) 10 MG tablet Take 10 mg by mouth 2 (two) times daily before a meal.        . DISCONTD: lisinopril (PRINIVIL,ZESTRIL) 5 MG tablet Take 5 mg by mouth daily.        [name] DISCONTD: metoprolol tartrate (LOPRESSOR) 25 MG tablet Take 25 mg by mouth 2 (two) times daily.          Past Medical History  Diagnosis Date  . Diabetes mellitus   . Hyperlipidemia   . Depression   . Coronary artery disease     99% ostial ramus.  s/p PCI with BMS 10/2010    Past Surgical History  Procedure Date  . Cardiac catheterization     ZOX:WRUEAV of systems complete and found to be negative unless listed above Review of systems complete and found to be negative unless listed above PHYSICAL EXAM BP 141/78  Pulse 86  Resp 18  Ht 6\' 2"  (1.88 m)  Wt 218 lb (98.884 kg)  BMI 27.99 kg/m2  General: Well developed, well nourished, in no acute distress Head: Eyes PERRLA, Bilateral xanthomas.   Normal cephalic and atramatic  Lungs: Clear bilaterally to auscultation and percussion. Heart: HRRR S1 S2, 1/6 systolic murmur, Pulses are 2+ & equal.            No carotid bruit. No JVD.  No abdominal bruits. No femoral bruits. Abdomen: Bowel sounds are positive, abdomen soft and non-tender without masses or                  Hernia's noted. Msk:  Back normal, normal gait. Normal strength and tone for age. Extremities: No clubbing, cyanosis or edema.  DP +1 Right groin negative for bleeding or hematoma. Neuro: Alert and oriented X 3. Psych:  Flat  affect, responds appropriately    ASSESSMENT AND PLAN

## 2012-02-06 NOTE — Assessment & Plan Note (Signed)
He is advised to see provider at health dept for better diabetes management with Hgb, AIC at 9.1 during admission.

## 2012-02-06 NOTE — Patient Instructions (Signed)
**Note De-Identified Jim Lundin Obfuscation** Your physician recommends that you continue on your current medications as directed. Please refer to the Current Medication list given to you today.  Your physician recommends that you schedule a follow-up appointment in: 3 months ( please see Health Dept. concerning your diabetic care)

## 2012-03-06 ENCOUNTER — Emergency Department (HOSPITAL_COMMUNITY): Payer: Medicaid Other

## 2012-03-06 ENCOUNTER — Observation Stay (HOSPITAL_COMMUNITY)
Admission: EM | Admit: 2012-03-06 | Discharge: 2012-03-07 | Disposition: A | Discharge status: Home / Self Care | Payer: Medicaid Other | Source: Ambulatory Visit | Attending: Cardiology | Admitting: Cardiology

## 2012-03-06 ENCOUNTER — Encounter (HOSPITAL_COMMUNITY): Payer: Self-pay

## 2012-03-06 DIAGNOSIS — E7800 Pure hypercholesterolemia, unspecified: Secondary | ICD-10-CM

## 2012-03-06 DIAGNOSIS — I2510 Atherosclerotic heart disease of native coronary artery without angina pectoris: Secondary | ICD-10-CM | POA: Insufficient documentation

## 2012-03-06 DIAGNOSIS — I447 Left bundle-branch block, unspecified: Secondary | ICD-10-CM | POA: Insufficient documentation

## 2012-03-06 DIAGNOSIS — E785 Hyperlipidemia, unspecified: Secondary | ICD-10-CM | POA: Insufficient documentation

## 2012-03-06 DIAGNOSIS — R0789 Other chest pain: Principal | ICD-10-CM | POA: Insufficient documentation

## 2012-03-06 DIAGNOSIS — F329 Major depressive disorder, single episode, unspecified: Secondary | ICD-10-CM | POA: Insufficient documentation

## 2012-03-06 DIAGNOSIS — F411 Generalized anxiety disorder: Secondary | ICD-10-CM | POA: Insufficient documentation

## 2012-03-06 DIAGNOSIS — F3289 Other specified depressive episodes: Secondary | ICD-10-CM | POA: Insufficient documentation

## 2012-03-06 DIAGNOSIS — R079 Chest pain, unspecified: Secondary | ICD-10-CM

## 2012-03-06 DIAGNOSIS — Z9861 Coronary angioplasty status: Secondary | ICD-10-CM | POA: Insufficient documentation

## 2012-03-06 DIAGNOSIS — E119 Type 2 diabetes mellitus without complications: Secondary | ICD-10-CM | POA: Insufficient documentation

## 2012-03-06 DIAGNOSIS — F17200 Nicotine dependence, unspecified, uncomplicated: Secondary | ICD-10-CM | POA: Insufficient documentation

## 2012-03-06 DIAGNOSIS — E669 Obesity, unspecified: Secondary | ICD-10-CM | POA: Insufficient documentation

## 2012-03-06 HISTORY — DX: Obesity, unspecified: E66.9

## 2012-03-06 HISTORY — DX: Anxiety disorder, unspecified: F41.9

## 2012-03-06 HISTORY — DX: Tobacco use: Z72.0

## 2012-03-06 HISTORY — DX: Essential (primary) hypertension: I10

## 2012-03-06 LAB — BASIC METABOLIC PANEL
BUN: 22 mg/dL (ref 6–23)
CO2: 24 mEq/L (ref 19–32)
Chloride: 102 mEq/L (ref 96–112)
Glucose, Bld: 139 mg/dL — ABNORMAL HIGH (ref 70–99)
Potassium: 4.3 mEq/L (ref 3.5–5.1)
Sodium: 138 mEq/L (ref 135–145)

## 2012-03-06 LAB — CBC
HCT: 40.4 % (ref 39.0–52.0)
HCT: 37.6 % — ABNORMAL LOW (ref 39.0–52.0)
Hemoglobin: 14.1 g/dL (ref 13.0–17.0)
MCH: 32.2 pg (ref 26.0–34.0)
MCH: 32.6 pg (ref 26.0–34.0)
MCHC: 34.9 g/dL (ref 30.0–36.0)
MCV: 92.2 fL (ref 78.0–100.0)
MCV: 91.5 fL (ref 78.0–100.0)
Platelets: 333 10*3/uL (ref 150–400)
RBC: 4.38 MIL/uL (ref 4.22–5.81)
RDW: 12.8 % (ref 11.5–15.5)
WBC: 7.6 10*3/uL (ref 4.0–10.5)

## 2012-03-06 LAB — CREATININE, SERUM: GFR calc Af Amer: >90 mL/min (ref >90)

## 2012-03-06 LAB — DIFFERENTIAL
Basophils Relative: 1 % (ref 0–1)
Eosinophils Absolute: 0.1 10*3/uL (ref 0.0–0.7)
Eosinophils Relative: 1 % (ref 0–5)
Lymphs Abs: 3 10*3/uL (ref 0.7–4.0)
Monocytes Absolute: 0.6 10*3/uL (ref 0.1–1.0)
Monocytes Relative: 7 % (ref 3–12)
Neutrophils Relative %: 52 % (ref 43–77)

## 2012-03-06 LAB — POCT I-STAT TROPONIN I: Troponin i, poc: 0 ng/mL (ref 0.00–0.08)

## 2012-03-06 MED ORDER — METOPROLOL SUCCINATE ER 25 MG PO TB24
25.0000 mg | ORAL_TABLET | Freq: Every day | ORAL | Status: DC
  Administered 2012-03-07: 25 mg via ORAL
  Filled 2012-03-06: qty 1

## 2012-03-06 MED ORDER — NITROGLYCERIN 0.4 MG SL SUBL
0.4000 mg | SUBLINGUAL_TABLET | SUBLINGUAL | Status: DC | PRN
  Administered 2012-03-06 (×2): 0.4 mg via SUBLINGUAL
  Filled 2012-03-06: qty 25

## 2012-03-06 MED ORDER — NITROGLYCERIN 0.4 MG SL SUBL: 0.4000 mg | SUBLINGUAL_TABLET | SUBLINGUAL | Status: DC | PRN

## 2012-03-06 MED ORDER — ASPIRIN EC 81 MG PO TBEC
81.0000 mg | DELAYED_RELEASE_TABLET | Freq: Every day | ORAL | Status: DC
  Administered 2012-03-07: 81 mg via ORAL
  Filled 2012-03-06 (×2): qty 1

## 2012-03-06 MED ORDER — HEPARIN SODIUM (PORCINE) 5000 UNIT/ML IJ SOLN
5000.0000 [IU] | Freq: Three times a day (TID) | INTRAMUSCULAR | Status: DC
  Administered 2012-03-07: 5000 [IU] via SUBCUTANEOUS
  Filled 2012-03-06 (×5): qty 1

## 2012-03-06 MED ORDER — TRAZODONE HCL 50 MG PO TABS
50.0000 mg | ORAL_TABLET | Freq: Every day | ORAL | Status: DC
  Administered 2012-03-07: 50 mg via ORAL
  Filled 2012-03-06 (×3): qty 1

## 2012-03-06 MED ORDER — ACETAMINOPHEN 325 MG PO TABS: 650.0000 mg | ORAL_TABLET | ORAL | Status: DC | PRN

## 2012-03-06 MED ORDER — ASPIRIN 300 MG RE SUPP: 300.0000 mg | RECTAL | Status: AC

## 2012-03-06 MED ORDER — INSULIN ASPART 100 UNIT/ML ~~LOC~~ SOLN: 0.0000 [IU] | Freq: Every day | SUBCUTANEOUS | Status: DC

## 2012-03-06 MED ORDER — NITROGLYCERIN IN D5W 200-5 MCG/ML-% IV SOLN: 40.0000 ug/min | INTRAVENOUS | Status: DC

## 2012-03-06 MED ORDER — MORPHINE SULFATE 4 MG/ML IJ SOLN
4.0000 mg | Freq: Once | INTRAMUSCULAR | Status: AC
  Administered 2012-03-06: 4 mg via INTRAVENOUS
  Filled 2012-03-06: qty 1

## 2012-03-06 MED ORDER — METFORMIN HCL 500 MG PO TABS
1000.0000 mg | ORAL_TABLET | Freq: Two times a day (BID) | ORAL | Status: DC
  Administered 2012-03-07: 1000 mg via ORAL
  Filled 2012-03-06 (×4): qty 2

## 2012-03-06 MED ORDER — GI COCKTAIL ~~LOC~~
30.0000 mL | Freq: Once | ORAL | Status: AC
  Administered 2012-03-06: 30 mL via ORAL
  Filled 2012-03-06: qty 30

## 2012-03-06 MED ORDER — GABAPENTIN 400 MG PO CAPS
400.0000 mg | ORAL_CAPSULE | Freq: Three times a day (TID) | ORAL | Status: DC
  Administered 2012-03-07: 400 mg via ORAL
  Filled 2012-03-06 (×3): qty 1

## 2012-03-06 MED ORDER — ONDANSETRON HCL 4 MG/2ML IJ SOLN: 4.0000 mg | Freq: Four times a day (QID) | INTRAMUSCULAR | Status: DC | PRN

## 2012-03-06 MED ORDER — MORPHINE SULFATE 2 MG/ML IJ SOLN
2.0000 mg | Freq: Once | INTRAMUSCULAR | Status: AC
  Administered 2012-03-06: 2 mg via INTRAVENOUS
  Filled 2012-03-06: qty 1

## 2012-03-06 MED ORDER — INSULIN GLARGINE 100 UNIT/ML ~~LOC~~ SOLN
20.0000 [IU] | Freq: Every day | SUBCUTANEOUS | Status: DC
  Administered 2012-03-07: 20 [IU] via SUBCUTANEOUS

## 2012-03-06 MED ORDER — ATORVASTATIN CALCIUM 40 MG PO TABS
40.0000 mg | ORAL_TABLET | Freq: Every day | ORAL | Status: DC
  Filled 2012-03-06: qty 1

## 2012-03-06 MED ORDER — GEMFIBROZIL 600 MG PO TABS
600.0000 mg | ORAL_TABLET | Freq: Two times a day (BID) | ORAL | Status: DC
  Administered 2012-03-07: 600 mg via ORAL
  Filled 2012-03-06 (×4): qty 1

## 2012-03-06 MED ORDER — CITALOPRAM HYDROBROMIDE 20 MG PO TABS
20.0000 mg | ORAL_TABLET | Freq: Every day | ORAL | Status: DC
  Administered 2012-03-07: 20 mg via ORAL
  Filled 2012-03-06 (×2): qty 1

## 2012-03-06 MED ORDER — INSULIN ASPART 100 UNIT/ML ~~LOC~~ SOLN
0.0000 [IU] | Freq: Three times a day (TID) | SUBCUTANEOUS | Status: DC
  Administered 2012-03-07: 3 [IU] via SUBCUTANEOUS

## 2012-03-06 MED ORDER — PRASUGREL HCL 10 MG PO TABS
10.0000 mg | ORAL_TABLET | Freq: Every day | ORAL | Status: DC
  Administered 2012-03-07: 10 mg via ORAL
  Filled 2012-03-06 (×2): qty 1

## 2012-03-06 MED ORDER — INSULIN ASPART 100 UNIT/ML ~~LOC~~ SOLN
4.0000 [IU] | Freq: Three times a day (TID) | SUBCUTANEOUS | Status: DC
  Administered 2012-03-07: 4 [IU] via SUBCUTANEOUS

## 2012-03-06 MED ORDER — ASPIRIN 81 MG PO CHEW
324.0000 mg | CHEWABLE_TABLET | ORAL | Status: AC
  Administered 2012-03-06: 324 mg via ORAL
  Filled 2012-03-06: qty 4

## 2012-03-06 NOTE — ED Provider Notes (Signed)
History     CSN: [id number]  Arrival date & time 03/06/12  1825   First MD Initiated Contact with Patient 03/06/12 1826      Chief Complaint  Patient presents with  . Chest Pain    (Consider location/radiation/quality/duration/timing/severity/associated sxs/prior treatment) HPI Comments: Chest pain c/w prior MIs.  L sided sharp chest pain worse with inspiration.  No other symptoms.  Patient is a 46 y.o. male presenting with chest pain. The history is provided by the patient.  Chest Pain The chest pain began 3 - 5 hours ago. Duration of episode(s) is 5 hours. Chest pain occurs constantly. The chest pain is improving. The pain is associated with breathing. The severity of the pain is moderate. The quality of the pain is described as sharp. The pain does not radiate. Chest pain is worsened by deep breathing. Pertinent negatives for primary symptoms include no fever, no fatigue, no shortness of breath, no cough, no wheezing, no abdominal pain, no nausea and no vomiting. He tried nitroglycerin for the symptoms.     Past Medical History  Diagnosis Date  . Diabetes mellitus   . Hyperlipidemia   . Depression   . Coronary artery disease     99% ostial ramus.  s/p PCI with BMS 10/2010    Past Surgical History  Procedure Date  . Cardiac catheterization     History reviewed. No pertinent family history.  History  Substance Use Topics  . Smoking status: Former Smoker -- 1.0 packs/day    Types: Cigarettes  . Smokeless tobacco: Never Used  . Alcohol Use: No      Review of Systems  Constitutional: Negative for fever, activity change and fatigue.  HENT: Negative for congestion.   Eyes: Negative for pain.  Respiratory: Negative for cough, chest tightness, shortness of breath, wheezing and stridor.   Cardiovascular: Positive for chest pain. Negative for leg swelling.  Gastrointestinal: Negative for nausea, vomiting and abdominal pain.  Genitourinary: Negative for dysuria.    Musculoskeletal: Negative for arthralgias.  Skin: Negative for rash.  Neurological: Negative for headaches.  Psychiatric/Behavioral: Negative for behavioral problems.    Allergies  Review of patient's allergies indicates no known allergies.  Home Medications   Current Outpatient Rx  Name Route Sig Dispense Refill  . ASPIRIN 325 MG PO TABS Oral Take 325 mg by mouth daily.      [name] CITALOPRAM HYDROBROMIDE 40 MG PO TABS Oral Take 20 mg by mouth daily.     [name] GABAPENTIN 400 MG PO CAPS Oral Take 400 mg by mouth 3 (three) times daily.      [name] GEMFIBROZIL 600 MG PO TABS Oral Take 600 mg by mouth 2 (two) times daily before a meal.      . INSULIN GLARGINE 100 UNIT/ML [place] SOLN Subcutaneous Inject 44 Units into the skin at bedtime.     [name] LORATADINE 10 MG PO CAPS Oral Take 1 capsule by mouth daily.      [name] METFORMIN HCL 1000 MG PO TABS Oral Take 1 tablet (1,000 mg total) by mouth 2 (two) times daily with a meal. HOLD for 48 hours, restart on April 18th.    . METOPROLOL SUCCINATE ER 25 MG PO TB24 Oral Take 1 tablet (25 mg total) by mouth daily. 30 tablet 12  . NAPROXEN 500 MG PO TABS Oral Take 500 mg by mouth as needed.    [name] NITROGLYCERIN 0.4 MG SL SUBL Sublingual Place 1 tablet (0.4 mg total) under the tongue every 5 (  five) minutes as needed. For chest pain. 25 tablet 3  . PRASUGREL HCL 10 MG PO TABS Oral Take 1 tablet (10 mg total) by mouth daily. 30 tablet 12  . ROSUVASTATIN CALCIUM 20 MG PO TABS Oral Take 1 tablet (20 mg total) by mouth daily. 30 tablet 12  . TRAZODONE HCL 50 MG PO TABS Oral Take 50 mg by mouth at bedtime.        BP 112/70  Pulse 81  Temp(Src) 98.6 F (37 C) (Oral)  Resp 17  SpO2 97%  Physical Exam  Constitutional: He is oriented to person, place, and time. He appears well-developed and well-nourished. No distress.  HENT:  Head: Normocephalic and atraumatic.  Eyes: Conjunctivae and EOM are normal. Pupils are equal, round, and reactive to light. No scleral icterus.  Neck:  Normal range of motion. Neck supple.  Cardiovascular: Normal rate and regular rhythm.  Exam reveals no gallop and no friction rub.   No murmur heard. Pulmonary/Chest: Effort normal and breath sounds normal. No respiratory distress. He has no wheezes. He has no rales. He exhibits no tenderness.  Abdominal: Soft. He exhibits no distension and no mass. There is no tenderness. There is no rebound and no guarding.  Musculoskeletal: Normal range of motion. He exhibits no edema and no tenderness.  Neurological: He is alert and oriented to person, place, and time. He has normal reflexes. No cranial nerve deficit. He exhibits normal muscle tone. Coordination normal.  Skin: Skin is warm and dry. No rash noted. He is not diaphoretic. No erythema.  Psychiatric: He has a normal mood and affect. His behavior is normal. Judgment and thought content normal.    ED Course  Procedures (including critical care time)   Date: 03/06/2012  Rate: 82  Rhythm: normal sinus rhythm  QRS Axis: normal  Intervals: normal  ST/T Wave abnormalities: nonspecific ST/T changes  Conduction Disutrbances:left bundle branch block  Narrative Interpretation:   Old EKG Reviewed: unchanged    Labs Reviewed  BASIC METABOLIC PANEL - Abnormal; Notable for the following:    Glucose, Bld 139 (*)    All other components within normal limits  TROPONIN I  CBC  DIFFERENTIAL  D-DIMER, QUANTITATIVE  POCT I-STAT TROPONIN I   Dg Chest Portable 1 View  03/06/2012  *RADIOLOGY REPORT*  Clinical Data: Pain.  PORTABLE CHEST - 1 VIEW  Comparison: Plain film chest 01/22/2012.  Findings: Lungs are clear.  Heart size is normal.  No pneumothorax or effusion.  IMPRESSION: No acute disease.  Original Report Authenticated By: Bernadene Bell. D'ALESSIO, M.D.     1. Chest pain       MDM  Chest pain c/w prior MIs.  L sided sharp chest pain worse with inspiration.  No other symptoms. VSS.  CXR, EKG unconcerning.  First troponin negative.  Pain  somewhat improved with nitro.  Concern for ACS.  Put on nitro drip.  Morphine, Asa given.  Ddimer negative and low risk for PE - doubt PE.  Cards to admit.        Army Chaco, MD 03/06/12 2030

## 2012-03-06 NOTE — ED Notes (Signed)
Pt. Alert and oriented, pt. Transferred to floor via stretcher with RN and cardiac monitor, pt. Reports 6/10 chest pain Dr. Margo Aye aware

## 2012-03-06 NOTE — ED Notes (Signed)
PER EMS: patient reporting chest pain at 1230 this afternoon, patient had several doses of nitro without relief, patient had 324mg  aspirin, hx of STENT placement.  Denies n/v, pain feels similar to previous MI, pain 2/10

## 2012-03-06 NOTE — ED Notes (Signed)
Re-paged Dr. Margo Aye to [phone]

## 2012-03-06 NOTE — ED Notes (Signed)
Received report, pt. Alert and oriented, resting on stretcher with O2 2L N/C in place, left side chest pain remains 8/10, VSS

## 2012-03-07 ENCOUNTER — Encounter (HOSPITAL_COMMUNITY): Payer: Self-pay | Admitting: Cardiology

## 2012-03-07 DIAGNOSIS — R079 Chest pain, unspecified: Secondary | ICD-10-CM

## 2012-03-07 LAB — CARDIAC PANEL(CRET KIN+CKTOT+MB+TROPI)
CK, MB: 3.4 ng/mL (ref 0.3–4.0)
CK, MB: 3.4 ng/mL (ref 0.3–4.0)
Total CK: 89 U/L (ref 7–232)
Total CK: 80 U/L (ref 7–232)
Troponin I: <0.3 ng/mL (ref <0.30)

## 2012-03-07 LAB — GLUCOSE, CAPILLARY: Glucose-Capillary: 134 mg/dL — ABNORMAL HIGH (ref 70–99)

## 2012-03-07 MED ORDER — ACETAMINOPHEN 325 MG PO TABS: 650.0000 mg | ORAL_TABLET | Freq: Four times a day (QID) | ORAL | Status: DC | PRN

## 2012-03-07 NOTE — Discharge Summary (Signed)
Discharge Summary   Patient ID: Dustin Cruz MRN: [id number], DOB/AGE: Aug 15, 1966 46 y.o.  Primary MD: [facility] Primary Cardiologist: Dr. Dietrich Pates  Admit date: 03/06/2012 D/C date:     03/07/2012      Primary Discharge Diagnoses:  1. Chest pain, felt musculoskeletal in nature  - No objective evidence of ischemia  - For outpatient follow-up   Secondary Discharge Diagnoses:  1. CAD s/p BMS to Ramus 10/2009; Cath 01/22/12 prox 30-40% LAD, LCx ramus w/ hazy 70-80% in-stent restenosis, EF 60% treated medically 2. Hypertension 3. Hyperlipidemia 4. Diabetes Mellitus, Type 2 5. Tobacco Abuse 6. Depression 7. Anxiety 8. Obesity  Allergies No Known Allergies  Diagnostic Studies/Procedures:  None  History of Present Illness: 47 y.o. male w/ the above medical problems who presented to [facility] on 03/06/12 with complaints of chest pain.  He was recently admitted to [facility] hospital with complaints of chest pain on 01/22/2012 at which time he ruled out for MI. Cardiac catheterization per Dr. Riley Kill, demonstrated prox 30-40% LAD, LCx ramus w/ hazy 70-80% in-stent restenosis, EF 60%. He was elected to be treated medically because of the risks of compromising the LAD and large diagonal branch. He was continued on ASA, Effient, metoprolol, and statin. The day of presentation he reported chest pain that started while he was sitting at home that was associated with dizziness and made worse with inspiration and coughing. The pain continued for five hours prompting him to present to the ED.  Hospital Course: His chest pain was not relieved with NTG in the ED. EKG revealed NSR 82bpm, chronic LBBB. CXR was without acute cardiopulmonary abnormalities. Labs were significant for normal troponin, WBC, H&H, and DDimer. His chest pain was felt atypical and unlikely to represent ACS, but due to his history of CAD and ISR he was admitted for further evaluation and treatment.  Cardiac  enzymes were cycled and remained negative. He had no objective evidence of ischemia. He continued to have very atypical left lateral chest pain that was worsened with palpation and coughing. There was no leukocytosis, purulent sputum, and he remained afebrile. It was felt his chest pain was musculoskeletal in nature and could be treated with tylenol or ibuprofen with outpatient follow up. He continues to smoke for which he received cessation counseling.  He was seen and evaluated by Dr. Patty Sermons who felt he was stable for discharge home with plans for follow up as scheduled below.  Discharge Vitals: Blood pressure 121/79, pulse 77, temperature 97.5 F (36.4 C), temperature source Oral, resp. rate 18, height 6\' 2"  (1.88 m), weight 241 lb 6.4 oz (109.498 kg), SpO2 96.00%.  Labs: Component Value Date   WBC 7.6 03/06/2012   HGB 13.4 03/06/2012   HCT 37.6* 03/06/2012   MCV 91.5 03/06/2012   PLT 333 03/06/2012    Lab 03/06/12 2127 03/06/12 1841  NA -- 138  K -- 4.3  CL -- 102  CO2 -- 24  BUN -- 22  CREATININE 0.96 --  CALCIUM -- 9.1  GLUCOSE -- 139*   Basename 03/07/12 0320 03/06/12 2127 03/06/12 1840  CKTOTAL 89 89 --  CKMB 3.4 3.6 --  TROPONINI <0.30 <0.30 <0.30   Component Value Date   DDIMER <0.22 03/06/2012    Discharge Medications   Medication List  As of 03/07/2012 10:19 AM   TAKE these medications         acetaminophen 325 MG tablet   Commonly known as: TYLENOL   Take  2 tablets (650 mg total) by mouth every 6 (six) hours as needed for pain.      aspirin 325 MG tablet   Take 325 mg by mouth daily.      citalopram 40 MG tablet   Commonly known as: CELEXA   Take 20 mg by mouth daily.      gabapentin 400 MG capsule   Commonly known as: NEURONTIN   Take 400 mg by mouth 3 (three) times daily.      gemfibrozil 600 MG tablet   Commonly known as: LOPID   Take 600 mg by mouth 2 (two) times daily before a meal.      LANTUS SOLOSTAR 100 UNIT/ML injection   Generic drug:  insulin glargine   Inject 44 Units into the skin at bedtime.      Loratadine 10 MG Caps   Take 1 capsule by mouth daily.      metFORMIN 1000 MG tablet   Commonly known as: GLUCOPHAGE   Take 1 tablet (1,000 mg total) by mouth 2 (two) times daily with a meal. HOLD for 48 hours, restart on April 18th.      metoprolol succinate 25 MG 24 hr tablet   Commonly known as: TOPROL-XL   Take 1 tablet (25 mg total) by mouth daily.      naproxen 500 MG tablet   Commonly known as: NAPROSYN   Take 500 mg by mouth as needed.      nitroGLYCERIN 0.4 MG SL tablet   Commonly known as: NITROSTAT   Place 1 tablet (0.4 mg total) under the tongue every 5 (five) minutes as needed. For chest pain.      prasugrel 10 MG Tabs   Commonly known as: EFFIENT   Take 1 tablet (10 mg total) by mouth daily.      rosuvastatin 20 MG tablet   Commonly known as: CRESTOR   Take 1 tablet (20 mg total) by mouth daily.      traZODone 50 MG tablet   Commonly known as: DESYREL   Take 50 mg by mouth at bedtime.            Disposition   Discharge Orders    Future Appointments: Provider: Department: Dept Phone: Center:   03/14/2012 11:20 AM Jodelle Gross, NP [facility] [phone] [id number]     Future Orders Please Complete By Expires   Diet - low sodium heart healthy      Increase activity slowly      Discharge instructions      Comments:   **PLEASE REMEMBER TO BRING ALL OF YOUR MEDICATIONS TO EACH OF YOUR FOLLOW-UP OFFICE VISITS.  * You may take Tylenol or Ibuprofen/Naproxen for your chest wall pain.  * Please stop smoking!     Follow-up Information    Follow up with Joni Reining, NP on 03/14/2012. (11:20)    Contact information:   [facility] [address] [place] [address] [phone]       Follow up with Your Primary Care Provider. (As needed)           Outstanding Labs/Studies:  None  Duration of Discharge Encounter: Greater than 30 minutes  including physician and PA time.  Signed, Damarcus Reggio PA-C 03/07/2012, 10:19 AM

## 2012-03-07 NOTE — Progress Notes (Signed)
Pt given discharge instructions, medication lists, follow up appointments, and when to call the doctor.  Pt verbalized understanding of instructions. Dustin Cruz

## 2012-03-07 NOTE — H&P (Signed)
NAMEVITTORIO, MOHS                  ACCOUNT NO.:  [id number]  MEDICAL RECORD NO.:  [id number]  LOCATION:  2019                         FACILITY:  [facility]  PHYSICIAN:  Natasha Bence, MD       DATE OF BIRTH:  14-Jan-1966  DATE OF ADMISSION:  03/06/2012 DATE OF DISCHARGE:                             HISTORY & PHYSICAL   CHIEF COMPLAINT:  Chest discomfort.  HISTORY OF PRESENT ILLNESS:  [name] is a 46 year old white male with known coronary artery disease, status post PCI last year that represents to the emergency department with chest discomfort.  He reports pain started around noon today while he was sitting at home.  He also became lightheaded and dizzy.  He denies any associated shortness of breath, nausea, or diaphoresis.  He has been doing fairly well since his last hospital discharge.  He has not had any angina or exertional dyspnea. He denies any PND, orthopnea, or palpitations.  He reports the pain is worse with inspiration and he has been coughing quite frequently.  He denies any fevers, chills, or sweats.  There was no productive sputum. He describes this pain as being identical to his previous angina; however, on further questioning, he reports the pain is located more directly near previous scar, has surgical incision site on the left side of his chest.  He does report it makes him anxious and he has chest pain that radiated across his anterior chest.  Otherwise, he has been hurting almost continuously for 5 hours now.  ER has given him a couple of doses of nitroglycerin without much relief.  He appears comfortable and says it is in a pretty severe discomfort.  Otherwise, he denies any fevers, chills, sweats, melena, or hematochezia, or any other bleeding.  No rashes or ulcers.  Rest of complete review of systems was negative except for what was stated above.  PAST MEDICAL HISTORY: 1. Coronary artery disease, status post PCI to ostial ramus in 2012,     he had in-stent  restenoses noted on cath few months ago, has been     treated medically. 2. Hypertension. 3. Diabetes. 4. Dyslipidemia. 5. Depression.  FAMILY HISTORY:  Mom with stroke.  No known coronary artery disease.  SOCIAL HISTORY:  He has been smoking about a Scarpati per day.  He denies any alcohol or illicit drug use.  ALLERGIES:  He has no known drug allergies.  HOME MEDICATIONS:  He is on 1. Aspirin 325 mg p.o. daily. 2. Celexa 20 mg p.o. daily. 3. Neurontin 400 mg p.o. t.i.d. 4. Lopid 600 mg p.o. b.i.d. 5. Lantus insulin 44 units subcu at bedtime. 6. Loratadine 10 mg daily. 7. Metformin 1 g p.o. b.i.d. 8. Toprol-XL 25 mg p.o. daily. 9. Naprosyn 500 mg p.r.n. 10.Nitroglycerin p.r.n. 11.Prasugrel 10 mg p.o. daily. 12.Crestor 20 mg p.o. daily. 13.Trazodone 50 mg p.o. at bedtime.  PHYSICAL EXAMINATION:  VITAL SIGNS:  He is afebrile.  Temperature 98.6, pulse of 81 and regular, respiratory rate of 17, blood pressure 112/70, O2 sats 97% on room air. GENERAL:  He is well-developed white male in no apparent distress. HEENT:  Eyes has anicteric sclerae. NECK:  Normal  jugular venous pressure.  No carotid bruits. LUNGS:  Clear to auscultation bilaterally. CHEST:  He has well-healed scar in his mid axillary line about the fifth intercostal space with mild tenderness to palpation.  No erythema. CARDIOVASCULAR:  He has a regular rate and rhythm.  No murmurs, rubs, or gallops. ABDOMEN:  Soft, nontender, and nondistended.  No masses or bruits. EXTREMITIES:  Warm with no edema.  He has symmetrical pulses throughout. NEUROLOGIC:  Grossly afocal. SKIN:  No rashes or ulcers.  LABORATORY DATA:  Sodium 138, potassium 4.3, chloride of 102, bicarb of 24, BUN of 22, creatinine of 0.93, calcium of 9.1, and glucose of 139. Troponin was less than 0.3.  White count was 7.7 with a normal differential, hematocrit was 40 and platelet count was 376.  D-dimer less than 0.22.  Chest x-ray showed no acute  infiltrates or effusions.  EKG shows sinus mechanism, rate at 82 beats per minute.  He has a left bundle-branch block, which is chronic.  IMPRESSION AND PLAN:  This is a 46 year old male with known coronary artery disease, status post bare-metal stenting in 2012, found to have in-stent restenosis few months back, presents with chest discomfort.  By his history and exam, his chest discomfort is very atypical of angina. However, he is concerned that this is very similar to his previous anginal episodes.  He describes the pain is more pleuritic in nature, but also seems to have some tenderness around his incision site on the left chest.  He has been hurting for almost 5 hours continuously and cardiac enzymes were unmeasurable.  EKG is unchanged.  He has a chronic left bundle-branch block, however.  At this point, we will admit him for telemetry with serial cardiac enzymes.  He was started on nitroglycerin infusion in the ER, did not feel this is necessary given his history. We will continue on his other home medications as he is on including insulin with sliding scale coverage.          ______________________________ Natasha Bence, MD     MH/MEDQ  D:  03/06/2012  T:  03/07/2012  Job:  [id number]

## 2012-03-07 NOTE — Consult Note (Signed)
Pt is a 1 ppd smoker who is severely depressed. He is a 2/10 for wanting to quit. His wife is visibly upset with him since she states he's trying to kill himself by not quitting. Acknowledged pt's right to not want to quit and at the same time discussed and questioned pt's understanding of risk factors as they related to his specific health status. Pt verbalized and showed understanding of risk factors. We did have a discussion about  wellbutrin and its advantages for smokers as well as those with depression. Referred to MD if interested in Rx. We also talked in detail about his depression and how it stems from his unemployment and lack of purpose in his life. Encouraged pt to get mental health assistance and f/u with me when/if ready to quit.

## 2012-03-07 NOTE — Progress Notes (Signed)
Subjective:  The patient continues to have very atypical left lateral chest pain.  Left chest is tender to palpation.  Hurts patient to cough deeply. Smokes half Leggette cigarettes daily. No fever or leucocytosis. No purulent sputum. Chest xray normal.  Objective:  Vital Signs in the last 24 hours: Temp:  [97.5 F (36.4 C)-98.6 F (37 C)] 97.5 F (36.4 C) (05/30 0438) Pulse Rate:  [68-82] 77  (05/30 0438) Resp:  [9-20] 18  (05/30 0438) BP: (112-141)/(62-90) 121/79 mmHg (05/30 0438) SpO2:  [95 %-99 %] 96 % (05/30 0438) FiO2 (%):  [28 %] 28 % (05/29 2109) Weight:  [109.498 kg (241 lb 6.4 oz)] 109.498 kg (241 lb 6.4 oz) (05/29 2325)  Intake/Output from previous day: 05/29 0701 - 05/30 0700 In: 240 [P.O.:240] Out: -  Intake/Output from this shift:       . aspirin  324 mg Oral NOW   Or  . aspirin  300 mg Rectal NOW  . aspirin EC  81 mg Oral Daily  . atorvastatin  40 mg Oral q1800  . citalopram  20 mg Oral Daily  . gabapentin  400 mg Oral TID  . gemfibrozil  600 mg Oral BID AC  . gi cocktail  30 mL Oral Once  . heparin  5,000 Units Subcutaneous Q8H  . insulin aspart  0-15 Units Subcutaneous TID WC  . insulin aspart  0-5 Units Subcutaneous QHS  . insulin aspart  4 Units Subcutaneous TID WC  . insulin glargine  20 Units Subcutaneous QHS  . metFORMIN  1,000 mg Oral BID WC  . metoprolol succinate  25 mg Oral Daily  .  morphine injection  2 mg Intravenous Once  .  morphine injection  4 mg Intravenous Once  . prasugrel  10 mg Oral Daily  . traZODone  50 mg Oral QHS      . DISCONTD: nitroGLYCERIN      Physical Exam: The patient appears to be in no distress.  Head and neck exam reveals that the pupils are equal and reactive.  The extraocular movements are full.  There is no scleral icterus.  Mouth and pharynx are benign.  No lymphadenopathy.  No carotid bruits.  The jugular venous pressure is normal.  Thyroid is not enlarged or tender.  Chest is clear to percussion and  auscultation.  No rales or rhonchi.  Expansion of the chest is symmetrical. Tender over left chest to palpation.  Heart reveals no abnormal lift or heave.  First and second heart sounds are normal.  There is no murmur gallop rub or click.  The abdomen is soft and nontender.  Bowel sounds are normoactive.  There is no hepatosplenomegaly or mass.  There are no abdominal bruits.  Extremities reveal no phlebitis or edema.  Pedal pulses are good.  There is no cyanosis or clubbing.  Neurologic exam is normal strength and no lateralizing weakness.  No sensory deficits.  Integument reveals no rash  Lab Results:  [facility] 03/06/12 2127 03/06/12 1841  WBC 7.6 7.7  HGB 13.4 14.1  PLT 333 376    Basename 03/06/12 2127 03/06/12 1841  NA -- 138  K -- 4.3  CL -- 102  CO2 -- 24  GLUCOSE -- 139*  BUN -- 22  CREATININE 0.96 0.93    Basename 03/07/12 0320 03/06/12 2127  TROPONINI <0.30 <0.30   Hepatic Function Panel No results found for this basename: PROT,ALBUMIN,AST,ALT,ALKPHOS,BILITOT,BILIDIR,IBILI in the last 72 hours No results found for this basename: CHOL  in the last 72 hours No results found for this basename: PROTIME in the last 72 hours  Imaging: Dg Chest Portable 1 View  03/06/2012  *RADIOLOGY REPORT*  Clinical Data: Pain.  PORTABLE CHEST - 1 VIEW  Comparison: Plain film chest 01/22/2012.  Findings: Lungs are clear.  Heart size is normal.  No pneumothorax or effusion.  IMPRESSION: No acute disease.  Original Report Authenticated By: Bernadene Bell. Maricela Curet, M.D.    Cardiac Studies:  Assessment/Plan:  Patient Active Hospital Problem List: 1. Atypical chest pain consistent with musculoskeletal chest wall pain. 2. Known CAD 3. Ongoing tobacco abuse. 4. Diabetes mellitus 5. Dyslipidemia 6. Hypertension. 7. Depression 8. LBBB, old  Plan: Patient has ruled out for MI. His pain is consistent with chest wall pain. Will discharge today. States Dr. Tenny Craw is his [place] cardiologist.  Will have him follow up with her as outpatient in about a week.  Treat chest wall pain with tylenol or ibuprofen. Tobacco abuse counseling.   LOS: 1 day    Dustin Cruz 03/07/2012, 7:43 AM

## 2012-03-07 NOTE — ED Provider Notes (Signed)
I saw and evaluated the patient, reviewed the resident's note and I agree with the findings and plan.  Medically managed CAD with 5 hours of L sided pain worse with palpation and coughing. Old LBBB.  Glynn Octave, MD 03/07/12 1242

## 2012-03-14 ENCOUNTER — Encounter: Payer: Self-pay | Admitting: Adult Health

## 2012-03-14 ENCOUNTER — Ambulatory Visit (INDEPENDENT_AMBULATORY_CARE_PROVIDER_SITE_OTHER): Payer: Medicaid Other | Admitting: Adult Health

## 2012-03-14 VITALS — BP 119/73 | HR 80 | Resp 16 | Ht 73.0 in | Wt 222.0 lb

## 2012-03-14 DIAGNOSIS — E7800 Pure hypercholesterolemia, unspecified: Secondary | ICD-10-CM

## 2012-03-14 DIAGNOSIS — R1012 Left upper quadrant pain: Secondary | ICD-10-CM

## 2012-03-14 DIAGNOSIS — F17200 Nicotine dependence, unspecified, uncomplicated: Secondary | ICD-10-CM

## 2012-03-14 DIAGNOSIS — I2510 Atherosclerotic heart disease of native coronary artery without angina pectoris: Secondary | ICD-10-CM

## 2012-03-14 DIAGNOSIS — Z720 Tobacco use: Secondary | ICD-10-CM

## 2012-03-14 MED ORDER — NICOTINE 21 MG/24HR TD PT24: 1.0000 | MEDICATED_PATCH | TRANSDERMAL | Status: DC

## 2012-03-14 MED ORDER — NICOTINE 14 MG/24HR TD PT24: 1.0000 | MEDICATED_PATCH | TRANSDERMAL | Status: DC

## 2012-03-14 NOTE — Progress Notes (Signed)
HPI: Dustin Cruz is a 46 y/o patient of Dr. Dietrich Pates we are seeing for ongoing assessment and treatment of CAD with BMS to ramus in 10/2009. He is followed by the health dept in [place] for drug assistance and medical management of diabetes, depression/anxiety. He was recently admitted to [facility] hospital with complaints of chest pain on 01/22/2012. He was ruled out for MI. Cardiac catheterization per Dr. Riley Kill, demonstrating prox 30-40% LAD, LCx ramus,had hazy 70-80% in-stent restenosis. EF of .60%. He was elected to be treated medically because of the risks of compromising the LAD and large diagonal branch. He was continued on ASA, Effient and metoprolol with statin.     He was readmitted to [facility] on 03/06/2012 with recurrent LUQ pain, pain with inspiration and dizziness. He was ruled out for MI and diagnosed with musculoskeletal pain.  He has not had a recurrence since discharge but continues to have occasional abdominal discomfort. He has not been seen by GI in many years. He is also smoking again and requests nicoderm patch assistance. No Known Allergies  Current Outpatient Prescriptions  Medication Sig Dispense Refill  . acetaminophen (TYLENOL) 325 MG tablet Take 2 tablets (650 mg total) by mouth every 6 (six) hours as needed for pain.      [name] aspirin 325 MG tablet Take 325 mg by mouth daily.        . citalopram (CELEXA) 40 MG tablet Take 20 mg by mouth daily.       [name] gabapentin (NEURONTIN) 400 MG capsule Take 400 mg by mouth 3 (three) times daily.        [name] gemfibrozil (LOPID) 600 MG tablet Take 600 mg by mouth 2 (two) times daily before a meal.        . insulin glargine (LANTUS SOLOSTAR) 100 UNIT/ML injection Inject 44 Units into the skin at bedtime.       . Loratadine 10 MG CAPS Take 1 capsule by mouth daily.        . metFORMIN (GLUCOPHAGE) 1000 MG tablet Take 1 tablet (1,000 mg total) by mouth 2 (two) times daily with a meal. HOLD for 48 hours, restart on April 18th.      . metoprolol succinate  (TOPROL-XL) 25 MG 24 hr tablet Take 1 tablet (25 mg total) by mouth daily.  30 tablet  12  . naproxen (NAPROSYN) 500 MG tablet Take 500 mg by mouth as needed.      . nitroGLYCERIN (NITROSTAT) 0.4 MG SL tablet Place 1 tablet (0.4 mg total) under the tongue every 5 (five) minutes as needed. For chest pain.  25 tablet  3  . prasugrel (EFFIENT) 10 MG TABS Take 1 tablet (10 mg total) by mouth daily.  30 tablet  12  . rosuvastatin (CRESTOR) 20 MG tablet Take 1 tablet (20 mg total) by mouth daily.  30 tablet  12  . traZODone (DESYREL) 50 MG tablet Take 50 mg by mouth at bedtime.        . nicotine (NICODERM CQ - DOSED IN MG/24 HOURS) 14 mg/24hr patch Place 1 patch onto the skin daily.  14 patch  0  . nicotine (NICODERM CQ - DOSED IN MG/24 HOURS) 21 mg/24hr patch Place 1 patch onto the skin daily.  14 patch  0  . DISCONTD: glipiZIDE (GLUCOTROL) 10 MG tablet Take 10 mg by mouth 2 (two) times daily before a meal.        . DISCONTD: lisinopril (PRINIVIL,ZESTRIL) 5 MG tablet Take 5 mg by  mouth daily.        [name] DISCONTD: metoprolol tartrate (LOPRESSOR) 25 MG tablet Take 25 mg by mouth 2 (two) times daily.          Past Medical History  Diagnosis Date  . Diabetes mellitus     Type 2  . Hyperlipidemia   . Depression   . Coronary artery disease     s/p BMS to Ramus 10/2009;  Cath 01/22/12 prox 30-40% LAD, LCx ramus w/ hazy 70-80% in-stent restenosis, EF 60% treated medically  . HTN (hypertension)   . Tobacco abuse   . Obesity   . Anxiety     Past Surgical History  Procedure Date  . Cardiac catheterization     [id number] of systems complete and found to be negative unless listed above Review of systems complete and found to be negative unless listed above PHYSICAL EXAM BP 119/73  Pulse 80  Resp 16  Ht 6\' 1"  (1.854 m)  Wt 222 lb (100.699 kg)  BMI 29.29 kg/m2  General: Well developed, well nourished, in no acute distress Head: Eyes PERRLA, Bilateral xanthomas.  Reddened eye mucosa. Normal  cephalic and atramatic  Lungs: Clear bilaterally to auscultation and percussion. Heart: HRRR S1 S2, 1/6 systolic murmur, Pulses are 2+ & equal.            No carotid bruit. No JVD.  No abdominal bruits. No femoral bruits. Abdomen: Bowel sounds are positive, abdomen soft and non-tender without masses or                  Hernia's noted.Scar on lower abdomen and upper left chest from previous surgery. Msk:  Back normal, normal gait. Normal strength and tone for age. Extremities: No clubbing, cyanosis or edema.  DP +1 Right groin negative for bleeding or hematoma. Neuro: Alert and oriented X 3. Psych:  Flat affect, responds appropriately    ASSESSMENT AND PLAN

## 2012-03-14 NOTE — Patient Instructions (Addendum)
Your physician recommends that you schedule a follow-up appointment in: 6 months  You have been referred to [facility] for abdominal pain - They will be contacting you with an appointment  Your physician has recommended you make the following change in your medication:  1 - START Nicoderm 21 mg x 2 weeks, then 14 mg for two weeks daily thereafter

## 2012-03-14 NOTE — Assessment & Plan Note (Signed)
He was in remission, but has unfortunately started back. He is requesting nicotene patches. I have provided Rx for 21mg  for 2 weeks and then 14 mg for two weeks, and then stop. He is advised to quit ASAP. He verbalizes understanding.

## 2012-03-14 NOTE — Assessment & Plan Note (Signed)
Recent admission for noncardiac chest pain,. He is having abdominal discomfort on the upper left quadrant. I will refer to GI for further evaluation with remote history of cancer. He will continue current medications. Will see in 6 months.

## 2012-03-14 NOTE — Assessment & Plan Note (Signed)
Considered stopping crestor if musculoskeletal pain continued,but it has subsided. Only abdominal pain. He will follow-up with lipids and LFTs in 3 months.

## 2012-03-17 ENCOUNTER — Encounter (HOSPITAL_COMMUNITY): Payer: Self-pay | Admitting: *Deleted

## 2012-03-17 ENCOUNTER — Emergency Department (HOSPITAL_COMMUNITY)
Admission: EM | Admit: 2012-03-17 | Discharge: 2012-03-17 | Disposition: A | Discharge status: Home / Self Care | Payer: Medicaid Other | Attending: Emergency Medicine | Admitting: Emergency Medicine

## 2012-03-17 ENCOUNTER — Emergency Department (HOSPITAL_COMMUNITY): Payer: Medicaid Other

## 2012-03-17 DIAGNOSIS — I10 Essential (primary) hypertension: Secondary | ICD-10-CM | POA: Insufficient documentation

## 2012-03-17 DIAGNOSIS — R739 Hyperglycemia, unspecified: Secondary | ICD-10-CM

## 2012-03-17 DIAGNOSIS — E1169 Type 2 diabetes mellitus with other specified complication: Secondary | ICD-10-CM | POA: Insufficient documentation

## 2012-03-17 DIAGNOSIS — K209 Esophagitis, unspecified without bleeding: Secondary | ICD-10-CM | POA: Insufficient documentation

## 2012-03-17 DIAGNOSIS — R109 Unspecified abdominal pain: Secondary | ICD-10-CM

## 2012-03-17 DIAGNOSIS — I2510 Atherosclerotic heart disease of native coronary artery without angina pectoris: Secondary | ICD-10-CM | POA: Insufficient documentation

## 2012-03-17 DIAGNOSIS — F17200 Nicotine dependence, unspecified, uncomplicated: Secondary | ICD-10-CM | POA: Insufficient documentation

## 2012-03-17 LAB — DIFFERENTIAL
Basophils Absolute: 0 10*3/uL (ref 0.0–0.1)
Basophils Relative: 0 % (ref 0–1)
Eosinophils Absolute: 0.1 10*3/uL (ref 0.0–0.7)
Eosinophils Relative: 2 % (ref 0–5)
Monocytes Absolute: 0.6 10*3/uL (ref 0.1–1.0)
Monocytes Relative: 8 % (ref 3–12)
Neutro Abs: 3 10*3/uL (ref 1.7–7.7)

## 2012-03-17 LAB — COMPREHENSIVE METABOLIC PANEL
Albumin: 4.1 g/dL (ref 3.5–5.2)
Alkaline Phosphatase: 45 U/L (ref 39–117)
BUN: 19 mg/dL (ref 6–23)
Calcium: 10.2 mg/dL (ref 8.4–10.5)
Creatinine, Ser: 1.09 mg/dL (ref 0.50–1.35)
GFR calc Af Amer: >90 mL/min (ref >90)
Glucose, Bld: 298 mg/dL — ABNORMAL HIGH (ref 70–99)
Potassium: 4.1 mEq/L (ref 3.5–5.1)
Total Protein: 6.9 g/dL (ref 6.0–8.3)

## 2012-03-17 LAB — CBC
HCT: 39 % (ref 39.0–52.0)
Hemoglobin: 13.6 g/dL (ref 13.0–17.0)
MCH: 32.3 pg (ref 26.0–34.0)
MCHC: 34.9 g/dL (ref 30.0–36.0)
RDW: 12.7 % (ref 11.5–15.5)

## 2012-03-17 MED ORDER — HYDROMORPHONE HCL PF 1 MG/ML IJ SOLN
1.0000 mg | Freq: Once | INTRAMUSCULAR | Status: AC
  Administered 2012-03-17: 1 mg via INTRAVENOUS
  Filled 2012-03-17: qty 1

## 2012-03-17 MED ORDER — FAMOTIDINE 20 MG PO TABS: 20.0000 mg | ORAL_TABLET | Freq: Two times a day (BID) | ORAL | Status: DC

## 2012-03-17 MED ORDER — SODIUM CHLORIDE 0.9 % IV SOLN: INTRAVENOUS | Status: DC

## 2012-03-17 MED ORDER — ONDANSETRON HCL 4 MG/2ML IJ SOLN
4.0000 mg | Freq: Once | INTRAMUSCULAR | Status: AC
  Administered 2012-03-17: 4 mg via INTRAVENOUS
  Filled 2012-03-17: qty 2

## 2012-03-17 MED ORDER — HYDROCODONE-ACETAMINOPHEN 5-325 MG PO TABS: 1.0000 | ORAL_TABLET | Freq: Four times a day (QID) | ORAL | Status: AC | PRN

## 2012-03-17 MED ORDER — SODIUM CHLORIDE 0.9 % IV BOLUS (SEPSIS)
500.0000 mL | Freq: Once | INTRAVENOUS | Status: AC
  Administered 2012-03-17: 500 mL via INTRAVENOUS

## 2012-03-17 MED ORDER — IOHEXOL 300 MG/ML  SOLN
100.0000 mL | Freq: Once | INTRAMUSCULAR | Status: AC | PRN
  Administered 2012-03-17: 100 mL via INTRAVENOUS

## 2012-03-17 NOTE — ED Provider Notes (Signed)
History     CSN: [id number]  Arrival date & time 03/17/12  0031   First MD Initiated Contact with Patient 03/17/12 0102      Chief Complaint  Patient presents with  . Abdominal Pain    (Consider location/radiation/quality/duration/timing/severity/associated sxs/prior treatment) Patient is a 46 y.o. male presenting with abdominal pain. The history is provided by the patient.  Abdominal Pain The primary symptoms of the illness include abdominal pain. The primary symptoms of the illness do not include fever, shortness of breath, nausea, vomiting, diarrhea or dysuria. The current episode started 2 days ago. The onset of the illness was sudden. The problem has not changed since onset. The patient has not had a change in bowel habit. Symptoms associated with the illness do not include chills, anorexia, diaphoresis, constipation, urgency, hematuria, frequency or back pain. Significant associated medical issues include diabetes.   Patient now and treat as stated 3 weeks of abdominal pain but to me stated only 2 days pain is mostly all over the abdomen area no nausea no vomiting pain is 10 out of 10 no dysuria or hematuria.pain is described as sharp nonradiating.more pain in the left flank area. Past Medical History  Diagnosis Date  . Diabetes mellitus     Type 2  . Hyperlipidemia   . Depression   . Coronary artery disease     s/p BMS to Ramus 10/2009;  Cath 01/22/12 prox 30-40% LAD, LCx ramus w/ hazy 70-80% in-stent restenosis, EF 60% treated medically  . HTN (hypertension)   . Tobacco abuse   . Obesity   . Anxiety     Past Surgical History  Procedure Date  . Cardiac catheterization     History reviewed. No pertinent family history.  History  Substance Use Topics  . Smoking status: Former Smoker -- 1.0 packs/day    Types: Cigarettes    Quit date: 03/15/2012  . Smokeless tobacco: Never Used  . Alcohol Use: No      Review of Systems  Constitutional: Negative for fever, chills  and diaphoresis.  HENT: Negative for congestion and neck pain.   Eyes: Negative for redness.  Respiratory: Negative for shortness of breath.   Gastrointestinal: Positive for abdominal pain. Negative for nausea, vomiting, diarrhea, constipation and anorexia.  Genitourinary: Negative for dysuria, urgency, frequency and hematuria.  Musculoskeletal: Negative for back pain.  Skin: Negative for rash.  Neurological: Negative for headaches.  Hematological: Does not bruise/bleed easily.    Allergies  Review of patient's allergies indicates no known allergies.  Home Medications   Current Outpatient Rx  Name Route Sig Dispense Refill  . ACETAMINOPHEN 325 MG PO TABS Oral Take 2 tablets (650 mg total) by mouth every 6 (six) hours as needed for pain.    . ASPIRIN 325 MG PO TABS Oral Take 325 mg by mouth daily.      [name] CITALOPRAM HYDROBROMIDE 40 MG PO TABS Oral Take 20 mg by mouth daily.     [name] FAMOTIDINE 20 MG PO TABS Oral Take 1 tablet (20 mg total) by mouth 2 (two) times daily. 30 tablet 0  . GABAPENTIN 400 MG PO CAPS Oral Take 400 mg by mouth 3 (three) times daily.      [name] GEMFIBROZIL 600 MG PO TABS Oral Take 600 mg by mouth 2 (two) times daily before a meal.      . HYDROCODONE-ACETAMINOPHEN 5-325 MG PO TABS Oral Take 1-2 tablets by mouth every 6 (six) hours as needed for pain. 14 tablet 0  .  INSULIN GLARGINE 100 UNIT/ML [place] SOLN Subcutaneous Inject 44 Units into the skin at bedtime.     [name] LORATADINE 10 MG PO CAPS Oral Take 1 capsule by mouth daily.      [name] METFORMIN HCL 1000 MG PO TABS Oral Take 1 tablet (1,000 mg total) by mouth 2 (two) times daily with a meal. HOLD for 48 hours, restart on April 18th.    . METOPROLOL SUCCINATE ER 25 MG PO TB24 Oral Take 1 tablet (25 mg total) by mouth daily. 30 tablet 12  . NAPROXEN 500 MG PO TABS Oral Take 500 mg by mouth as needed.    [name] NICOTINE 14 MG/24HR TD PT24 Transdermal Place 1 patch onto the skin daily. 14 patch 0  . NICOTINE 21 MG/24HR TD PT24  Transdermal Place 1 patch onto the skin daily. 14 patch 0    Start with 1 patch daily for 2 weeks, then 14 mg f ...  . NITROGLYCERIN 0.4 MG SL SUBL Sublingual Place 1 tablet (0.4 mg total) under the tongue every 5 (five) minutes as needed. For chest pain. 25 tablet 3  . PRASUGREL HCL 10 MG PO TABS Oral Take 1 tablet (10 mg total) by mouth daily. 30 tablet 12  . ROSUVASTATIN CALCIUM 20 MG PO TABS Oral Take 1 tablet (20 mg total) by mouth daily. 30 tablet 12  . TRAZODONE HCL 50 MG PO TABS Oral Take 50 mg by mouth at bedtime.        BP 137/77  Pulse 88  Temp(Src) 98.1 F (36.7 C) (Oral)  Resp 20  Ht 6\' 2"  (1.88 m)  Wt 226 lb (102.513 kg)  BMI 29.02 kg/m2  SpO2 97%  Physical Exam  Nursing note and vitals reviewed. Constitutional: He is oriented to person, place, and time. He appears well-developed and well-nourished. No distress.  HENT:  Head: Normocephalic and atraumatic.  Mouth/Throat: Oropharynx is clear and moist.  Eyes: Conjunctivae and EOM are normal. Pupils are equal, round, and reactive to light.  Neck: Normal range of motion. Neck supple.  Cardiovascular: Normal rate, regular rhythm and normal heart sounds.   No murmur heard. Pulmonary/Chest: Effort normal and breath sounds normal. No respiratory distress. He has no wheezes. He has no rales.  Abdominal: Soft. Bowel sounds are normal. He exhibits distension. There is no tenderness. There is no guarding.  Musculoskeletal: Normal range of motion. He exhibits no tenderness.  Lymphadenopathy:    He has no cervical adenopathy.  Neurological: He is alert and oriented to person, place, and time. No cranial nerve deficit. He exhibits normal muscle tone. Coordination normal.  Skin: Skin is warm. No rash noted.    ED Course  Procedures (including critical care time)  Labs Reviewed  CBC - Abnormal; Notable for the following:    RBC 4.21 (*)    All other components within normal limits  DIFFERENTIAL - Abnormal; Notable for the  following:    Neutrophils Relative 41 (*)    Lymphocytes Relative 50 (*)    All other components within normal limits  LIPASE, BLOOD - Abnormal; Notable for the following:    Lipase 62 (*)    All other components within normal limits  COMPREHENSIVE METABOLIC PANEL - Abnormal; Notable for the following:    Sodium 132 (*)    Glucose, Bld 298 (*)    Total Bilirubin 0.2 (*)    GFR calc non Af Amer 80 (*)    All other components within normal limits   Ct Abdomen  Pelvis W Contrast  03/17/2012  *RADIOLOGY REPORT*  Clinical Data: Abdominal pain  CT ABDOMEN AND PELVIS WITH CONTRAST  Technique:  Multidetector CT imaging of the abdomen and pelvis was performed following the standard protocol during bolus administration of intravenous contrast.  Contrast: OMNIPAQUE IOHEXOL 300 MG/ML  SOLN  Comparison: None.  Findings: Limited images through the lung bases demonstrate no significant appreciable abnormality. The heart size is within normal limits. No pleural or pericardial effusion.  Circumferential distal esophageal wall thickening. Nonspecific gastric distension.  Diffuse low attenuation of the liver is nonspecific post contrast however can be seen with hepatic steatosis.  Decompressed gallbladder.  No biliary ductal dilatation.  Unremarkable spleen, pancreas, adrenal glands.  Symmetric renal enhancement.  No hydronephrosis or hydroureter.  No bowel obstruction.  No CT evidence for colitis.  Normal appendix.  No free intraperitoneal air or fluid.  No lymphadenopathy.  There is scattered atherosclerotic calcification of the aorta and its branches. No aneurysmal dilatation.  Thin-walled bladder.  No acute osseous finding.  IMPRESSION: Circumferential distal esophageal wall thickening can be seen in the setting of gastroesophageal reflux disease or esophagitis. Nonspecific gastric distension.  Correlate clinically if concerned for gastroparesis.  No bowel obstruction.  Despite the elevated lipase, pancreas has  a normal CT appearance.  Original Report Authenticated By: Waneta Martins, M.D.   Results for orders placed during the hospital encounter of 03/17/12  CBC      Component Value Range   WBC 7.3  4.0 - 10.5 (K/uL)   RBC 4.21 (*) 4.22 - 5.81 (MIL/uL)   Hemoglobin 13.6  13.0 - 17.0 (g/dL)   HCT 16.1  09.6 - 04.5 (%)   MCV 92.6  78.0 - 100.0 (fL)   MCH 32.3  26.0 - 34.0 (pg)   MCHC 34.9  30.0 - 36.0 (g/dL)   RDW 40.9  81.1 - 91.4 (%)   Platelets 352  150 - 400 (K/uL)  DIFFERENTIAL      Component Value Range   Neutrophils Relative 41 (*) 43 - 77 (%)   Neutro Abs 3.0  1.7 - 7.7 (K/uL)   Lymphocytes Relative 50 (*) 12 - 46 (%)   Lymphs Abs 3.6  0.7 - 4.0 (K/uL)   Monocytes Relative 8  3 - 12 (%)   Monocytes Absolute 0.6  0.1 - 1.0 (K/uL)   Eosinophils Relative 2  0 - 5 (%)   Eosinophils Absolute 0.1  0.0 - 0.7 (K/uL)   Basophils Relative 0  0 - 1 (%)   Basophils Absolute 0.0  0.0 - 0.1 (K/uL)  LIPASE, BLOOD      Component Value Range   Lipase 62 (*) 11 - 59 (U/L)  COMPREHENSIVE METABOLIC PANEL      Component Value Range   Sodium 132 (*) 135 - 145 (mEq/L)   Potassium 4.1  3.5 - 5.1 (mEq/L)   Chloride 96  96 - 112 (mEq/L)   CO2 26  19 - 32 (mEq/L)   Glucose, Bld 298 (*) 70 - 99 (mg/dL)   BUN 19  6 - 23 (mg/dL)   Creatinine, Ser 7.82  0.50 - 1.35 (mg/dL)   Calcium 95.6  8.4 - 10.5 (mg/dL)   Total Protein 6.9  6.0 - 8.3 (g/dL)   Albumin 4.1  3.5 - 5.2 (g/dL)   AST 9  0 - 37 (U/L)   ALT 10  0 - 53 (U/L)   Alkaline Phosphatase 45  39 - 117 (U/L)   Total Bilirubin  0.2 (*) 0.3 - 1.2 (mg/dL)   GFR calc non Af Amer 80 (*) >90 (mL/min)   GFR calc Af Amer >90  >90 (mL/min)     1. Abdominal  pain, other specified site   2. Esophagitis   3. Hyperglycemia       MDM    Patient presented with abdominal pain mostly left upper quadrant CT scan without specific findings other than inflammation around the esophagus which could be related to esophagitis. Labs show an elevation mild  in the lipase possibility of pancreatitis however CT shows no inflammation of the pancreas. Patient is a known diabetic but sugars elevated tonight at 298 no signs of acidosis. Patient is followed by a local primary care Dr. Evaristo Bury followup in 2 days at the blood sugar checked we'll treat the inflammation in the esophagus with Pepcid and recommend a clear liquid diet for the next 24 hours for the possibility of developing pancreatitis. Patient improved in the emergency department with medications provided will be discharged home with Pepcid and hydrocodone.  Also no leukocytosis, rest of liver function test are normal.        Shelda Jakes, MD 03/17/12 0400

## 2012-03-17 NOTE — Discharge Instructions (Signed)
Abdominal Pain Many things can cause belly (abdominal) pain. Most times, the belly pain is not dangerous. The amount of belly pain does not tell how serious the problem may be. Many cases of belly pain can be watched and treated at home. HOME CARE   Do not take medicines that help you go poop (laxatives) unless told to by your doctor.   Only take medicine as told by your doctor.   Eat or drink as told by your doctor. Your doctor will tell you if you should be on a special diet.  GET HELP RIGHT AWAY IF:   The pain does not go away.   You have a fever.   You keep throwing up (vomiting).   The pain changes and is only in the right or left part of the belly.   You have bloody or tarry looking poop.  MAKE SURE YOU:   Understand these instructions.   Will watch your condition.   Will get help right away if you are not doing well or get worse.  Document Released: 03/13/2008 Document Revised: 09/14/2011 Document Reviewed: 10/11/2009 [facility] Patient Information 2012 [place], [place].  followup with your regular Dr. In the next 2 days. Recommend a clear liquid diet for the next 24 hours due to possible pancreatitis however her CAT scan shows no inflammation of the pancreas. CAT scan does show inflammation of your distal esophagus recommend taking Pepcid prescription provided for the next 2 weeks. Blood sugar is elevated today have that rechecked in 2 days with your primary care Dr. Pain medicine take as directed for the abdominal pain. Return for any new or worse symptoms.

## 2012-03-17 NOTE — ED Notes (Signed)
Pt states that he is having trouble walking due to weakness reports dizziness with ambulation

## 2012-03-17 NOTE — ED Notes (Signed)
Pt states abd pain for 3 weeks increased this pm; No nausea/vomitting. Pt was seen here about 1 to 1 1/2 week ago; has seen PCP which he stated told him that it was muscle related

## 2012-03-17 NOTE — ED Notes (Addendum)
Pt educated about plan of care; told not to drive while on pain medication voiced understanding; pt tolerating liquids without vomiting nor complaints Also education about clear liquid diet x 24 hours; pt upset about only clears; Explained to pt that since he had been having abd pain with nausea that he only needed the clears to give his stomach time to "rest" Pt verbalized understanding

## 2012-04-03 ENCOUNTER — Telehealth: Payer: Self-pay

## 2012-04-03 ENCOUNTER — Ambulatory Visit (INDEPENDENT_AMBULATORY_CARE_PROVIDER_SITE_OTHER): Payer: Medicaid Other | Admitting: Gastroenterology

## 2012-04-03 ENCOUNTER — Encounter: Payer: Self-pay | Admitting: Gastroenterology

## 2012-04-03 VITALS — BP 122/76 | HR 77 | Temp 96.9°F | Ht 74.0 in | Wt 220.8 lb

## 2012-04-03 DIAGNOSIS — R1314 Dysphagia, pharyngoesophageal phase: Secondary | ICD-10-CM

## 2012-04-03 DIAGNOSIS — R1012 Left upper quadrant pain: Secondary | ICD-10-CM

## 2012-04-03 DIAGNOSIS — R1310 Dysphagia, unspecified: Secondary | ICD-10-CM | POA: Insufficient documentation

## 2012-04-03 DIAGNOSIS — K219 Gastro-esophageal reflux disease without esophagitis: Secondary | ICD-10-CM | POA: Insufficient documentation

## 2012-04-03 DIAGNOSIS — R6881 Early satiety: Secondary | ICD-10-CM | POA: Insufficient documentation

## 2012-04-03 MED ORDER — DEXLANSOPRAZOLE 60 MG PO CPDR: 60.0000 mg | DELAYED_RELEASE_CAPSULE | Freq: Every day | ORAL | Status: DC

## 2012-04-03 MED ORDER — HYDROCODONE-ACETAMINOPHEN 5-500 MG PO TABS: 1.0000 | ORAL_TABLET | ORAL | Status: AC | PRN

## 2012-04-03 NOTE — Assessment & Plan Note (Signed)
Refractory GERD, early satiety, poor appetite. Nocturnal regurgitation and esophageal dysphagia. Stop Pepcid. Start Dexilant. Omeprazole/nexium with potential drug interaction with SSRIs.Samples of Dexilant provided. Will plan on EGD/ED in near future. We will contact cardiology to see if ok to hold Effient and how long need to hold for reversal of effects. We will schedule EGD/ED once this information obtained.

## 2012-04-03 NOTE — Assessment & Plan Note (Signed)
LUQ pain. No improvement on naproxyn. He had melanoma removed from the area where is had the pain and also some lymph node dissection of left axilla. This was about six years ago. CT ok. Doubt pancreatitis based on minimally elevated lipase and clinical symptoms. Doubt symptoms due to GERD/PUD but can evaluate at time of EGD. Repeat LFTs/lipase. I suspect musculoskeletal component. May ultimately need thoracic spine films to look for evidence of space narrowing etc. Vicodin given for severe pain only. Further recommendations to follow.  As aside, I have encouraged patient to developed relationship with PCP given he now has Medicaid. He plans to make appt in near future.

## 2012-04-03 NOTE — Progress Notes (Signed)
Primary Care Physician:  Tylene Fantasia., PA  Primary Gastroenterologist:  Jonette Eva, MD   Chief Complaint  Patient presents with  . Abdominal Pain    HPI:  Dustin Cruz is a 46 y.o. male here for further evaluation of abdominal pain. Seen recently in ED for abdominal pain. Work up showed lipase of 62, normal LFTs, CBC. He had CT which showed distended stomach and circumferential esophagitis. Patient was started on Pepcid. At some point, also given naproxen for suspected musculoskeletal pain in LUQ. Hospitalized in 02/2012, MI ruled out.   C/O two months, sharp shooting pain, knock breath out, has to turn certain way to get the pain to stop. Mostly LUQ. Not related to meals. Pain may last 5 minutes to several hours. Occurs daily. Lots of heartburn/indigestion, appetite poor. One meal daily, feels full. Feels bloated. Diagnosed with diabetes, at least 8 years ago. Food hangs in chest, wash food down. Nocturnal regurgitation of food, vomiting. BM every couple of days, no straining, no blood in stools, melena.   Staggering, slurred speech, with episode that sent him to ED. Wife states he was acting drunk. She wonders if he got too much nicotine. He started wearing nicotine patch the day of ED visit and she questions if he may have smoked a cigarette too. He has not had any further episodes of confusion, etc.    Current Outpatient Prescriptions  Medication Sig Dispense Refill  . acetaminophen (TYLENOL) 325 MG tablet Take 2 tablets (650 mg total) by mouth every 6 (six) hours as needed for pain.      [name] aspirin 325 MG tablet Take 325 mg by mouth daily.        . citalopram (CELEXA) 40 MG tablet Take 20 mg by mouth daily.       . famotidine (PEPCID) 20 MG tablet Take 1 tablet (20 mg total) by mouth 2 (two) times daily.  30 tablet  0  . gabapentin (NEURONTIN) 400 MG capsule Take 400 mg by mouth 3 (three) times daily.        [name] gemfibrozil (LOPID) 600 MG tablet Take 600 mg by mouth 2 (two) times daily  before a meal.        . insulin glargine (LANTUS SOLOSTAR) 100 UNIT/ML injection Inject 54 Units into the skin at bedtime.       . Loratadine 10 MG CAPS Take 1 capsule by mouth daily.        . metoprolol succinate (TOPROL-XL) 25 MG 24 hr tablet Take 1 tablet (25 mg total) by mouth daily.  30 tablet  12  . naproxen (NAPROSYN) 500 MG tablet Take 500 mg by mouth as needed.      . nitroGLYCERIN (NITROSTAT) 0.4 MG SL tablet Place 1 tablet (0.4 mg total) under the tongue every 5 (five) minutes as needed. For chest pain.  25 tablet  3  . prasugrel (EFFIENT) 10 MG TABS Take 1 tablet (10 mg total) by mouth daily.  30 tablet  12  . rosuvastatin (CRESTOR) 20 MG tablet Take 1 tablet (20 mg total) by mouth daily.  30 tablet  12  . traZODone (DESYREL) 50 MG tablet Take 50 mg by mouth at bedtime.        . metFORMIN (GLUCOPHAGE) 1000 MG tablet Take 1 tablet (1,000 mg total) by mouth 2 (two) times daily with a meal. HOLD for 48 hours, restart on April 18th.      . nicotine (NICODERM CQ - DOSED IN MG/24 HOURS) 14 mg/24hr  patch Place 1 patch onto the skin daily.  14 patch  0  . DISCONTD: glipiZIDE (GLUCOTROL) 10 MG tablet Take 10 mg by mouth 2 (two) times daily before a meal.        . DISCONTD: lisinopril (PRINIVIL,ZESTRIL) 5 MG tablet Take 5 mg by mouth daily.        [name] DISCONTD: metoprolol tartrate (LOPRESSOR) 25 MG tablet Take 25 mg by mouth 2 (two) times daily.          Allergies as of 04/03/2012  . (No Known Allergies)    Past Medical History  Diagnosis Date  . Diabetes mellitus     Type 2  . Hyperlipidemia   . Depression   . Coronary artery disease     s/p BMS to Ramus 10/2009;  Cath 01/22/12 prox 30-40% LAD, LCx ramus w/ hazy 70-80% in-stent restenosis, EF 60% treated medically  . HTN (hypertension)   . Tobacco abuse   . Obesity   . Anxiety     Past Surgical History  Procedure Date  . Cardiac catheterization    Family History  Problem Relation Age of Onset  . Lung cancer    . Heart disease     . Other      not real familiar with family history       History   Social History  . Marital Status: Married    Spouse Name: Dustin Cruz    Number of Children: N/A  . Years of Education: N/A   Occupational History  . disabled    Social History Main Topics  . Smoking status: Current Some Day Smoker -- 0.5 packs/day    Types: Cigarettes    Last Attempt to Quit: 03/15/2012  . Smokeless tobacco: Never Used  . Alcohol Use: No  . Drug Use: No  . Sexually Active: Yes   Other Topics Concern  . Not on file   Social History Narrative  . No narrative on file      ROS:  General: Negative for anorexia,  fever, chills, fatigue, weakness. ?weight loss, clothes somewhat looser. Eyes: Negative for vision changes.  ENT: Negative for hoarseness,  nasal congestion. See HPI. CV: Negative for chest pain, angina, palpitations, dyspnea on exertion, peripheral edema.  Respiratory: Negative for dyspnea at rest, dyspnea on exertion, cough, sputum, wheezing.  GI: See history of present illness. GU:  Negative for dysuria, hematuria, urinary incontinence, urinary frequency, nocturnal urination.  MS: Negative for joint pain. C/O mid-back pain.  Derm: Negative for rash or itching.  Neuro: Negative for weakness, abnormal sensation, seizure, frequent headaches, memory loss, confusion. See hpi. Psych: Negative for anxiety, depression, suicidal ideation, hallucinations.  Endo: Negative for unusual weight change.  Heme: Negative for bruising or bleeding. Allergy: Negative for rash or hives.    Physical Examination:  BP 122/76  Pulse 77  Temp 96.9 F (36.1 C) (Tympanic)  Ht 6\' 2"  (1.88 m)  Wt 220 lb 12.8 oz (100.154 kg)  BMI 28.35 kg/m2   General: Well-nourished, well-developed in no acute distress. Accompanied by wife.  Head: Normocephalic, atraumatic.   Eyes: Conjunctiva pink, no icterus. Mouth: Oropharyngeal mucosa moist and pink , no lesions erythema or exudate. Neck: Supple  without thyromegaly, masses, or lymphadenopathy.  Lungs: Clear to auscultation bilaterally.  Heart: Regular rate and rhythm, no murmurs rubs or gallops.  Abdomen: Bowel sounds are normal, nondistended, no hepatosplenomegaly or masses, no abdominal bruits or    hernia , no rebound or guarding.  Moderate LUQ/left  ribcage pain with palpation. No CVA tenderness or point tenderness over thoracic spine. No rash.   Rectal: not performed. Extremities: No lower extremity edema. No clubbing or deformities.  Neuro: Alert and oriented x 4 , grossly normal neurologically.  Skin: Warm and dry, no rash or jaundice.   Psych: Alert and cooperative, normal mood and affect.  Labs: Lab Results  Component Value Date   CREATININE 1.09 03/17/2012   BUN 19 03/17/2012   NA 132* 03/17/2012   K 4.1 03/17/2012   CL 96 03/17/2012   CO2 26 03/17/2012   Lab Results  Component Value Date   ALT 10 03/17/2012   AST 9 03/17/2012   ALKPHOS 45 03/17/2012   BILITOT 0.2* 03/17/2012   Lab Results  Component Value Date   WBC 7.3 03/17/2012   HGB 13.6 03/17/2012   HCT 39.0 03/17/2012   MCV 92.6 03/17/2012   PLT 352 03/17/2012   Lab Results  Component Value Date   LIPASE 62* 03/17/2012     Imaging Studies: Ct Abdomen Pelvis W Contrast  03/17/2012  *RADIOLOGY REPORT*  Clinical Data: Abdominal pain  CT ABDOMEN AND PELVIS WITH CONTRAST  Technique:  Multidetector CT imaging of the abdomen and pelvis was performed following the standard protocol during bolus administration of intravenous contrast.  Contrast: OMNIPAQUE IOHEXOL 300 MG/ML  SOLN  Comparison: None.  Findings: Limited images through the lung bases demonstrate no significant appreciable abnormality. The heart size is within normal limits. No pleural or pericardial effusion.  Circumferential distal esophageal wall thickening. Nonspecific gastric distension.  Diffuse low attenuation of the liver is nonspecific post contrast however can be seen with hepatic steatosis.  Decompressed  gallbladder.  No biliary ductal dilatation.  Unremarkable spleen, pancreas, adrenal glands.  Symmetric renal enhancement.  No hydronephrosis or hydroureter.  No bowel obstruction.  No CT evidence for colitis.  Normal appendix.  No free intraperitoneal air or fluid.  No lymphadenopathy.  There is scattered atherosclerotic calcification of the aorta and its branches. No aneurysmal dilatation.  Thin-walled bladder.  No acute osseous finding.  IMPRESSION: Circumferential distal esophageal wall thickening can be seen in the setting of gastroesophageal reflux disease or esophagitis. Nonspecific gastric distension.  Correlate clinically if concerned for gastroparesis.  No bowel obstruction.  Despite the elevated lipase, pancreas has a normal CT appearance.  Original Report Authenticated By: Waneta Martins, M.D.   Dg Chest Portable 1 View  03/06/2012  *RADIOLOGY REPORT*  Clinical Data: Pain.  PORTABLE CHEST - 1 VIEW  Comparison: Plain film chest 01/22/2012.  Findings: Lungs are clear.  Heart size is normal.  No pneumothorax or effusion.  IMPRESSION: No acute disease.  Original Report Authenticated By: Bernadene Bell. Maricela Curet, M.D.

## 2012-04-03 NOTE — Telephone Encounter (Signed)
Please see note below from Tana Coast PA-C regarding pt's effient, thanks   GERD (gastroesophageal reflux disease) - Tana Coast, PA 04/03/2012 12:46 PM Signed  Refractory GERD, early satiety, poor appetite. Nocturnal regurgitation and esophageal dysphagia. Stop Pepcid. Start Dexilant. Omeprazole/nexium with potential drug interaction with SSRIs.Samples of Dexilant provided. Will plan on EGD/ED in near future. We will contact cardiology to see if ok to hold Effient and how long need to hold for reversal of effects. We will schedule EGD/ED once this information obtained.

## 2012-04-03 NOTE — Progress Notes (Signed)
Faxed to PCP

## 2012-04-04 ENCOUNTER — Other Ambulatory Visit: Payer: Self-pay | Admitting: Internal Medicine

## 2012-04-04 DIAGNOSIS — R1310 Dysphagia, unspecified: Secondary | ICD-10-CM

## 2012-04-04 DIAGNOSIS — K219 Gastro-esophageal reflux disease without esophagitis: Secondary | ICD-10-CM

## 2012-04-04 LAB — HEPATIC FUNCTION PANEL
AST: 10 U/L (ref 0–37)
Albumin: 4.9 g/dL (ref 3.5–5.2)
Alkaline Phosphatase: 43 U/L (ref 39–117)
Bilirubin, Direct: 0.1 mg/dL (ref 0.0–0.3)
Indirect Bilirubin: 0.3 mg/dL (ref 0.0–0.9)
Total Bilirubin: 0.4 mg/dL (ref 0.3–1.2)

## 2012-04-04 NOTE — Telephone Encounter (Signed)
Can hold for 5 days prior to EGD. Start back ASAP.

## 2012-04-04 NOTE — Telephone Encounter (Signed)
Dustin Cruz, please schedule pt EGD. Please include effient instructions. Thanks.

## 2012-04-04 NOTE — Telephone Encounter (Signed)
Noted. Will make Dr. Jena Gauss aware that he will need to provide instructions at time of EGD for restarting Effient.

## 2012-04-04 NOTE — Telephone Encounter (Signed)
Patient is scheduled for EGD w/RMR on 07/17 at 10:30 and instructions for EGD and Effient was mailed to the patient and he is aware

## 2012-04-05 NOTE — Progress Notes (Signed)
Quick Note:  Labs are normal. EGD as planned. ______

## 2012-04-06 ENCOUNTER — Emergency Department (HOSPITAL_COMMUNITY)
Admission: EM | Admit: 2012-04-06 | Discharge: 2012-04-07 | Disposition: A | Discharge status: Home / Self Care | Payer: Medicaid Other | Attending: Emergency Medicine | Admitting: Emergency Medicine

## 2012-04-06 ENCOUNTER — Encounter (HOSPITAL_COMMUNITY): Payer: Self-pay

## 2012-04-06 DIAGNOSIS — Z79899 Other long term (current) drug therapy: Secondary | ICD-10-CM | POA: Insufficient documentation

## 2012-04-06 DIAGNOSIS — Z794 Long term (current) use of insulin: Secondary | ICD-10-CM | POA: Insufficient documentation

## 2012-04-06 DIAGNOSIS — E119 Type 2 diabetes mellitus without complications: Secondary | ICD-10-CM | POA: Insufficient documentation

## 2012-04-06 DIAGNOSIS — K219 Gastro-esophageal reflux disease without esophagitis: Secondary | ICD-10-CM | POA: Insufficient documentation

## 2012-04-06 DIAGNOSIS — R109 Unspecified abdominal pain: Secondary | ICD-10-CM

## 2012-04-06 DIAGNOSIS — Z7982 Long term (current) use of aspirin: Secondary | ICD-10-CM | POA: Insufficient documentation

## 2012-04-06 DIAGNOSIS — F329 Major depressive disorder, single episode, unspecified: Secondary | ICD-10-CM | POA: Insufficient documentation

## 2012-04-06 DIAGNOSIS — R1013 Epigastric pain: Secondary | ICD-10-CM | POA: Insufficient documentation

## 2012-04-06 DIAGNOSIS — E785 Hyperlipidemia, unspecified: Secondary | ICD-10-CM | POA: Insufficient documentation

## 2012-04-06 DIAGNOSIS — F17200 Nicotine dependence, unspecified, uncomplicated: Secondary | ICD-10-CM | POA: Insufficient documentation

## 2012-04-06 DIAGNOSIS — F3289 Other specified depressive episodes: Secondary | ICD-10-CM | POA: Insufficient documentation

## 2012-04-06 DIAGNOSIS — I2510 Atherosclerotic heart disease of native coronary artery without angina pectoris: Secondary | ICD-10-CM | POA: Insufficient documentation

## 2012-04-06 HISTORY — DX: Acute myocardial infarction, unspecified: I21.9

## 2012-04-06 MED ORDER — SODIUM CHLORIDE 0.9 % IV SOLN
Freq: Once | INTRAVENOUS | Status: AC
  Administered 2012-04-06: via INTRAVENOUS

## 2012-04-06 NOTE — ED Notes (Signed)
Pt recently diagnosed with gerd, esophagitis, abd pain, here tonight with abd pain worse tonight.

## 2012-04-07 LAB — COMPREHENSIVE METABOLIC PANEL
ALT: 20 U/L (ref 0–53)
Albumin: 4.3 g/dL (ref 3.5–5.2)
Alkaline Phosphatase: 43 U/L (ref 39–117)
GFR calc Af Amer: 64 mL/min — ABNORMAL LOW (ref >90)
Glucose, Bld: 275 mg/dL — ABNORMAL HIGH (ref 70–99)
Potassium: 4.2 mEq/L (ref 3.5–5.1)
Sodium: 135 mEq/L (ref 135–145)
Total Protein: 7 g/dL (ref 6.0–8.3)

## 2012-04-07 MED ORDER — ONDANSETRON HCL 4 MG/2ML IJ SOLN
4.0000 mg | Freq: Once | INTRAMUSCULAR | Status: AC
  Administered 2012-04-07: 4 mg via INTRAVENOUS
  Filled 2012-04-07: qty 2

## 2012-04-07 MED ORDER — HYDROMORPHONE HCL PF 1 MG/ML IJ SOLN
1.0000 mg | Freq: Once | INTRAMUSCULAR | Status: AC
  Administered 2012-04-07: 1 mg via INTRAVENOUS
  Filled 2012-04-07: qty 1

## 2012-04-07 NOTE — Discharge Instructions (Signed)
Continue to use your home medicines. Follow up with your doctor.

## 2012-04-07 NOTE — ED Provider Notes (Signed)
History     CSN: [id number]  Arrival date & time 04/06/12  2329   First MD Initiated Contact with Patient 04/07/12 0000      Chief Complaint  Patient presents with  . Abdominal Pain    (Consider location/radiation/quality/duration/timing/severity/associated sxs/prior treatment) HPI Dustin Cruz is a 46 y.o. male with a h/o GERD, gastritis, esophagitis,who  Is being followed by GI, Dr. Jena Gauss and scheduled for EGD on 04/24/2012,presents to the Emergency Department complaining of continued epigastric pain.Denies fever, chills, vomiting, diarrhea. He endorses medical compliance with current medicines.   PCp Dr. Ledell Peoples GI Dr. Jena Gauss   Past Medical History  Diagnosis Date  . Diabetes mellitus     Type 2  . Hyperlipidemia   . Depression   . Coronary artery disease     s/p BMS to Ramus 10/2009;  Cath 01/22/12 prox 30-40% LAD, LCx ramus w/ hazy 70-80% in-stent restenosis, EF 60% treated medically  . HTN (hypertension)   . Tobacco abuse   . Obesity   . Anxiety   . Melanoma 2007    surgery at [facility]  . Myocardial infarction     Past Surgical History  Procedure Date  . Cardiac catheterization   . Melanoma surgery 2007    [facility], removed lymph nodes under arm as well    Family History  Problem Relation Age of Onset  . Lung cancer    . Heart disease    . Other      not real familiar with family history    History  Substance Use Topics  . Smoking status: Current Some Day Smoker -- 0.5 packs/day    Types: Cigarettes    Last Attempt to Quit: 03/15/2012  . Smokeless tobacco: Never Used  . Alcohol Use: No      Review of Systems  Constitutional: Negative for fever.       10 Systems reviewed and are negative for acute change except as noted in the HPI.  HENT: Negative for congestion.   Eyes: Negative for discharge and redness.  Respiratory: Negative for cough and shortness of breath.   Cardiovascular: Negative for chest pain.  Gastrointestinal: Positive for abdominal pain.  Negative for vomiting.  Musculoskeletal: Negative for back pain.  Skin: Negative for rash.  Neurological: Negative for syncope, numbness and headaches.  Psychiatric/Behavioral:       No behavior change.    Allergies  Review of patient's allergies indicates no known allergies.  Home Medications   Current Outpatient Rx  Name Route Sig Dispense Refill  . ACETAMINOPHEN 325 MG PO TABS Oral Take 2 tablets (650 mg total) by mouth every 6 (six) hours as needed for pain.    . ASPIRIN 325 MG PO TABS Oral Take 325 mg by mouth daily.      [name] CITALOPRAM HYDROBROMIDE 40 MG PO TABS Oral Take 20 mg by mouth daily.     . DEXLANSOPRAZOLE 60 MG PO CPDR Oral Take 1 capsule (60 mg total) by mouth daily. 20 capsule 0  . GABAPENTIN 400 MG PO CAPS Oral Take 400 mg by mouth 3 (three) times daily.      [name] GEMFIBROZIL 600 MG PO TABS Oral Take 600 mg by mouth 2 (two) times daily before a meal.      . HYDROCODONE-ACETAMINOPHEN 5-500 MG PO TABS Oral Take 1 tablet by mouth every 4 (four) hours as needed for pain. 20 tablet 0  . INSULIN GLARGINE 100 UNIT/ML [place] SOLN Subcutaneous Inject 54 Units into the skin at  bedtime.     [name] LORATADINE 10 MG PO CAPS Oral Take 1 capsule by mouth daily.      [name] METFORMIN HCL 1000 MG PO TABS Oral Take 1 tablet (1,000 mg total) by mouth 2 (two) times daily with a meal. HOLD for 48 hours, restart on April 18th.    . METOPROLOL SUCCINATE ER 25 MG PO TB24 Oral Take 1 tablet (25 mg total) by mouth daily. 30 tablet 12  . NAPROXEN 500 MG PO TABS Oral Take 500 mg by mouth as needed.    [name] NITROGLYCERIN 0.4 MG SL SUBL Sublingual Place 1 tablet (0.4 mg total) under the tongue every 5 (five) minutes as needed. For chest pain. 25 tablet 3  . PRASUGREL HCL 10 MG PO TABS Oral Take 1 tablet (10 mg total) by mouth daily. 30 tablet 12  . ROSUVASTATIN CALCIUM 20 MG PO TABS Oral Take 1 tablet (20 mg total) by mouth daily. 30 tablet 12  . TRAZODONE HCL 50 MG PO TABS Oral Take 50 mg by mouth at bedtime.         BP 129/76  Pulse 88  Temp 98.1 F (36.7 C) (Oral)  Resp 20  Ht 6\' 2"  (1.88 m)  Wt 220 lb (99.791 kg)  BMI 28.25 kg/m2  SpO2 98%  Physical Exam  Nursing note and vitals reviewed. Constitutional: He appears well-developed and well-nourished.       Awake, alert, nontoxic appearance.  HENT:  Head: Normocephalic and atraumatic.  Right Ear: External ear normal.  Left Ear: External ear normal.  Mouth/Throat: Oropharynx is clear and moist.  Eyes: Right eye exhibits no discharge. Left eye exhibits no discharge.  Neck: Neck supple.  Cardiovascular: Normal rate and normal heart sounds.   Pulmonary/Chest: Effort normal and breath sounds normal. He exhibits no tenderness.  Abdominal: Soft. Bowel sounds are normal. There is tenderness. There is no rebound.       Mild epigastric discomfort to palpation.  Musculoskeletal: He exhibits no tenderness.       Baseline ROM, no obvious new focal weakness.  Neurological:       Mental status and motor strength appears baseline for patient and situation.  Skin: No rash noted.  Psychiatric: He has a normal mood and affect.    ED Course  Procedures (including critical care time)  Results for orders placed during the hospital encounter of 04/06/12  LIPASE, BLOOD      Component Value Range   Lipase 14  11 - 59 U/L  COMPREHENSIVE METABOLIC PANEL      Component Value Range   Sodium 135  135 - 145 mEq/L   Potassium 4.2  3.5 - 5.1 mEq/L   Chloride 99  96 - 112 mEq/L   CO2 24  19 - 32 mEq/L   Glucose, Bld 275 (*) 70 - 99 mg/dL   BUN 22  6 - 23 mg/dL   Creatinine, Ser 4.54 (*) 0.50 - 1.35 mg/dL   Calcium 09.8  8.4 - 11.9 mg/dL   Total Protein 7.0  6.0 - 8.3 g/dL   Albumin 4.3  3.5 - 5.2 g/dL   AST 16  0 - 37 U/L   ALT 20  0 - 53 U/L   Alkaline Phosphatase 43  39 - 117 U/L   Total Bilirubin 0.2 (*) 0.3 - 1.2 mg/dL   GFR calc non Af Amer 55 (*) >90 mL/min   GFR calc Af Amer 64 (*) >90 mL/min  No results found.  MDM  Patient with recent  diagnoses of GERD, gastritis and esophagitis scheduled for EGD 04/24/2012 for further evaluation here with continued epigastric pain. Labs unremarkable. Lipase negative. Given IVF, analgesic, antiemetic with relief.  Pt feels improved after observation and/or treatment in ED.Pt stable in ED with no significant deterioration in condition.The patient appears reasonably screened and/or stabilized for discharge and I doubt any other medical condition or other [facility] requiring further screening, evaluation, or treatment in the ED at this time prior to discharge.  MDM Reviewed: nursing note, vitals and previous chart Reviewed previous: labs and x-ray Interpretation: labs           Nicoletta Dress. Colon Branch, MD 04/07/12 2132

## 2012-04-08 NOTE — Progress Notes (Signed)
Quick Note:  Mailed letter to pt, egd scheduled for 04/24/12 ______

## 2012-04-09 ENCOUNTER — Encounter (HOSPITAL_COMMUNITY): Payer: Self-pay | Admitting: Pharmacy Technician

## 2012-04-24 ENCOUNTER — Encounter (HOSPITAL_COMMUNITY)
Admission: RE | Disposition: A | Discharge status: Home / Self Care | Payer: Self-pay | Source: Ambulatory Visit | Attending: Internal Medicine

## 2012-04-24 ENCOUNTER — Ambulatory Visit (HOSPITAL_COMMUNITY)
Admission: RE | Admit: 2012-04-24 | Discharge: 2012-04-24 | Disposition: A | Discharge status: Home / Self Care | Payer: Medicaid Other | Source: Ambulatory Visit | Attending: Internal Medicine | Admitting: Internal Medicine

## 2012-04-24 ENCOUNTER — Encounter (HOSPITAL_COMMUNITY): Payer: Self-pay | Admitting: *Deleted

## 2012-04-24 DIAGNOSIS — Z01812 Encounter for preprocedural laboratory examination: Secondary | ICD-10-CM | POA: Insufficient documentation

## 2012-04-24 DIAGNOSIS — Z79899 Other long term (current) drug therapy: Secondary | ICD-10-CM | POA: Insufficient documentation

## 2012-04-24 DIAGNOSIS — Z794 Long term (current) use of insulin: Secondary | ICD-10-CM | POA: Insufficient documentation

## 2012-04-24 DIAGNOSIS — I10 Essential (primary) hypertension: Secondary | ICD-10-CM | POA: Insufficient documentation

## 2012-04-24 DIAGNOSIS — K2940 Chronic atrophic gastritis without bleeding: Secondary | ICD-10-CM | POA: Insufficient documentation

## 2012-04-24 DIAGNOSIS — R1310 Dysphagia, unspecified: Secondary | ICD-10-CM

## 2012-04-24 DIAGNOSIS — R933 Abnormal findings on diagnostic imaging of other parts of digestive tract: Secondary | ICD-10-CM

## 2012-04-24 DIAGNOSIS — K2100 Gastro-esophageal reflux disease with esophagitis, without bleeding: Secondary | ICD-10-CM | POA: Insufficient documentation

## 2012-04-24 DIAGNOSIS — Z85820 Personal history of malignant melanoma of skin: Secondary | ICD-10-CM | POA: Insufficient documentation

## 2012-04-24 DIAGNOSIS — K219 Gastro-esophageal reflux disease without esophagitis: Secondary | ICD-10-CM

## 2012-04-24 DIAGNOSIS — E119 Type 2 diabetes mellitus without complications: Secondary | ICD-10-CM | POA: Insufficient documentation

## 2012-04-24 DIAGNOSIS — K2990 Gastroduodenitis, unspecified, without bleeding: Secondary | ICD-10-CM

## 2012-04-24 DIAGNOSIS — K2970 Gastritis, unspecified, without bleeding: Secondary | ICD-10-CM

## 2012-04-24 DIAGNOSIS — E785 Hyperlipidemia, unspecified: Secondary | ICD-10-CM | POA: Insufficient documentation

## 2012-04-24 HISTORY — PX: ESOPHAGOGASTRODUODENOSCOPY: SHX5428

## 2012-04-24 HISTORY — DX: Gastro-esophageal reflux disease without esophagitis: K21.9

## 2012-04-24 SURGERY — EGD (ESOPHAGOGASTRODUODENOSCOPY)
Anesthesia: Moderate Sedation

## 2012-04-24 MED ORDER — BUTAMBEN-TETRACAINE-BENZOCAINE 2-2-14 % EX AERO
INHALATION_SPRAY | CUTANEOUS | Status: DC | PRN
  Administered 2012-04-24: 2 via TOPICAL

## 2012-04-24 MED ORDER — MIDAZOLAM HCL 5 MG/5ML IJ SOLN
INTRAMUSCULAR | Status: AC
  Filled 2012-04-24: qty 10

## 2012-04-24 MED ORDER — SODIUM CHLORIDE 0.45 % IV SOLN
Freq: Once | INTRAVENOUS | Status: AC
  Administered 2012-04-24: 1000 mL via INTRAVENOUS

## 2012-04-24 MED ORDER — MEPERIDINE HCL 100 MG/ML IJ SOLN
INTRAMUSCULAR | Status: AC
  Filled 2012-04-24: qty 2

## 2012-04-24 MED ORDER — STERILE WATER FOR IRRIGATION IR SOLN
Status: DC | PRN
  Administered 2012-04-24: 11:00:00

## 2012-04-24 MED ORDER — MEPERIDINE HCL 100 MG/ML IJ SOLN
INTRAMUSCULAR | Status: DC | PRN
  Administered 2012-04-24: 50 mg via INTRAVENOUS
  Administered 2012-04-24: 25 mg via INTRAVENOUS

## 2012-04-24 MED ORDER — MIDAZOLAM HCL 5 MG/5ML IJ SOLN
INTRAMUSCULAR | Status: DC | PRN
  Administered 2012-04-24 (×2): 2 mg via INTRAVENOUS

## 2012-04-24 NOTE — Interval H&P Note (Signed)
History and Physical Interval Note:  04/24/2012 10:43 AM  Dustin Cruz  has presented today for surgery, with the diagnosis of GERD,DYSPHAGIA  The various methods of treatment have been discussed with the patient and family. After consideration of risks, benefits and other options for treatment, the patient has consented to  Procedure(s) (LRB): ESOPHAGOGASTRODUODENOSCOPY (EGD) (N/A) as a surgical intervention .  The patient's history has been reviewed, patient examined, no change in status, stable for surgery.  I have reviewed the patients' chart and labs.  Questions were answered to the patient's satisfaction.     [name]

## 2012-04-24 NOTE — Interval H&P Note (Signed)
History and Physical Interval Note:  04/24/2012 10:46 AM  Dustin Cruz  has presented today for surgery, with the diagnosis of GERD,DYSPHAGIA  The various methods of treatment have been discussed with the patient and family. After consideration of risks, benefits and other options for treatment, the patient has consented to  Procedure(s) (LRB): ESOPHAGOGASTRODUODENOSCOPY (EGD) (N/A) as a surgical intervention .  The patient's history has been reviewed, patient examined, no change in status, stable for surgery.  I have reviewed the patients' chart and labs.  Questions were answered to the patient's satisfaction.     [name]  Patient states reflux symptoms and upper discomfort had improved on Dexilant. Continues to have left-sided abdominal pain. Continues to have esophageal dysphagia.

## 2012-04-24 NOTE — H&P (View-Only) (Signed)
Primary Care Physician:  MUSE,ROCHELLE D., PA  Primary Gastroenterologist:  Sandi Fields, MD   Chief Complaint  Patient presents with  . Abdominal Pain    HPI:  Dustin Cruz is a 46 y.o. male here for further evaluation of abdominal pain. Seen recently in ED for abdominal pain. Work up showed lipase of 62, normal LFTs, CBC. He had CT which showed distended stomach and circumferential esophagitis. Patient was started on Pepcid. At some point, also given naproxen for suspected musculoskeletal pain in LUQ. Hospitalized in 02/2012, MI ruled out.   C/O two months, sharp shooting pain, knock breath out, has to turn certain way to get the pain to stop. Mostly LUQ. Not related to meals. Pain may last 5 minutes to several hours. Occurs daily. Lots of heartburn/indigestion, appetite poor. One meal daily, feels full. Feels bloated. Diagnosed with diabetes, at least 8 years ago. Food hangs in chest, wash food down. Nocturnal regurgitation of food, vomiting. BM every couple of days, no straining, no blood in stools, melena.   Staggering, slurred speech, with episode that sent him to ED. Wife states he was acting drunk. She wonders if he got too much nicotine. He started wearing nicotine patch the day of ED visit and she questions if he may have smoked a cigarette too. He has not had any further episodes of confusion, etc.    Current Outpatient Prescriptions  Medication Sig Dispense Refill  . acetaminophen (TYLENOL) 325 MG tablet Take 2 tablets (650 mg total) by mouth every 6 (six) hours as needed for pain.      . aspirin 325 MG tablet Take 325 mg by mouth daily.        . citalopram (CELEXA) 40 MG tablet Take 20 mg by mouth daily.       . famotidine (PEPCID) 20 MG tablet Take 1 tablet (20 mg total) by mouth 2 (two) times daily.  30 tablet  0  . gabapentin (NEURONTIN) 400 MG capsule Take 400 mg by mouth 3 (three) times daily.        . gemfibrozil (LOPID) 600 MG tablet Take 600 mg by mouth 2 (two) times daily  before a meal.        . insulin glargine (LANTUS SOLOSTAR) 100 UNIT/ML injection Inject 54 Units into the skin at bedtime.       . Loratadine 10 MG CAPS Take 1 capsule by mouth daily.        . metoprolol succinate (TOPROL-XL) 25 MG 24 hr tablet Take 1 tablet (25 mg total) by mouth daily.  30 tablet  12  . naproxen (NAPROSYN) 500 MG tablet Take 500 mg by mouth as needed.      . nitroGLYCERIN (NITROSTAT) 0.4 MG SL tablet Place 1 tablet (0.4 mg total) under the tongue every 5 (five) minutes as needed. For chest pain.  25 tablet  3  . prasugrel (EFFIENT) 10 MG TABS Take 1 tablet (10 mg total) by mouth daily.  30 tablet  12  . rosuvastatin (CRESTOR) 20 MG tablet Take 1 tablet (20 mg total) by mouth daily.  30 tablet  12  . traZODone (DESYREL) 50 MG tablet Take 50 mg by mouth at bedtime.        . metFORMIN (GLUCOPHAGE) 1000 MG tablet Take 1 tablet (1,000 mg total) by mouth 2 (two) times daily with a meal. HOLD for 48 hours, restart on April 18th.      . nicotine (NICODERM CQ - DOSED IN MG/24 HOURS) 14 mg/24hr   patch Place 1 patch onto the skin daily.  14 patch  0  . DISCONTD: glipiZIDE (GLUCOTROL) 10 MG tablet Take 10 mg by mouth 2 (two) times daily before a meal.        . DISCONTD: lisinopril (PRINIVIL,ZESTRIL) 5 MG tablet Take 5 mg by mouth daily.        . DISCONTD: metoprolol tartrate (LOPRESSOR) 25 MG tablet Take 25 mg by mouth 2 (two) times daily.          Allergies as of 04/03/2012  . (No Known Allergies)    Past Medical History  Diagnosis Date  . Diabetes mellitus     Type 2  . Hyperlipidemia   . Depression   . Coronary artery disease     s/p BMS to Ramus 10/2009;  Cath 01/22/12 prox 30-40% LAD, LCx ramus w/ hazy 70-80% in-stent restenosis, EF 60% treated medically  . HTN (hypertension)   . Tobacco abuse   . Obesity   . Anxiety     Past Surgical History  Procedure Date  . Cardiac catheterization    Family History  Problem Relation Age of Onset  . Lung cancer    . Heart disease     . Other      not real familiar with family history       History   Social History  . Marital Status: Married    Spouse Name: rayann Earnhardt    Number of Children: N/A  . Years of Education: N/A   Occupational History  . disabled    Social History Main Topics  . Smoking status: Current Some Day Smoker -- 0.5 packs/day    Types: Cigarettes    Last Attempt to Quit: 03/15/2012  . Smokeless tobacco: Never Used  . Alcohol Use: No  . Drug Use: No  . Sexually Active: Yes   Other Topics Concern  . Not on file   Social History Narrative  . No narrative on file      ROS:  General: Negative for anorexia,  fever, chills, fatigue, weakness. ?weight loss, clothes somewhat looser. Eyes: Negative for vision changes.  ENT: Negative for hoarseness,  nasal congestion. See HPI. CV: Negative for chest pain, angina, palpitations, dyspnea on exertion, peripheral edema.  Respiratory: Negative for dyspnea at rest, dyspnea on exertion, cough, sputum, wheezing.  GI: See history of present illness. GU:  Negative for dysuria, hematuria, urinary incontinence, urinary frequency, nocturnal urination.  MS: Negative for joint pain. C/O mid-back pain.  Derm: Negative for rash or itching.  Neuro: Negative for weakness, abnormal sensation, seizure, frequent headaches, memory loss, confusion. See hpi. Psych: Negative for anxiety, depression, suicidal ideation, hallucinations.  Endo: Negative for unusual weight change.  Heme: Negative for bruising or bleeding. Allergy: Negative for rash or hives.    Physical Examination:  BP 122/76  Pulse 77  Temp 96.9 F (36.1 C) (Tympanic)  Ht 6' 2" (1.88 m)  Wt 220 lb 12.8 oz (100.154 kg)  BMI 28.35 kg/m2   General: Well-nourished, well-developed in no acute distress. Accompanied by wife.  Head: Normocephalic, atraumatic.   Eyes: Conjunctiva pink, no icterus. Mouth: Oropharyngeal mucosa moist and pink , no lesions erythema or exudate. Neck: Supple  without thyromegaly, masses, or lymphadenopathy.  Lungs: Clear to auscultation bilaterally.  Heart: Regular rate and rhythm, no murmurs rubs or gallops.  Abdomen: Bowel sounds are normal, nondistended, no hepatosplenomegaly or masses, no abdominal bruits or    hernia , no rebound or guarding.  Moderate LUQ/left   ribcage pain with palpation. No CVA tenderness or point tenderness over thoracic spine. No rash.   Rectal: not performed. Extremities: No lower extremity edema. No clubbing or deformities.  Neuro: Alert and oriented x 4 , grossly normal neurologically.  Skin: Warm and dry, no rash or jaundice.   Psych: Alert and cooperative, normal mood and affect.  Labs: Lab Results  Component Value Date   CREATININE 1.09 03/17/2012   BUN 19 03/17/2012   NA 132* 03/17/2012   K 4.1 03/17/2012   CL 96 03/17/2012   CO2 26 03/17/2012   Lab Results  Component Value Date   ALT 10 03/17/2012   AST 9 03/17/2012   ALKPHOS 45 03/17/2012   BILITOT 0.2* 03/17/2012   Lab Results  Component Value Date   WBC 7.3 03/17/2012   HGB 13.6 03/17/2012   HCT 39.0 03/17/2012   MCV 92.6 03/17/2012   PLT 352 03/17/2012   Lab Results  Component Value Date   LIPASE 62* 03/17/2012     Imaging Studies: Ct Abdomen Pelvis W Contrast  03/17/2012  *RADIOLOGY REPORT*  Clinical Data: Abdominal pain  CT ABDOMEN AND PELVIS WITH CONTRAST  Technique:  Multidetector CT imaging of the abdomen and pelvis was performed following the standard protocol during bolus administration of intravenous contrast.  Contrast: 100mL OMNIPAQUE IOHEXOL 300 MG/ML  SOLN  Comparison: None.  Findings: Limited images through the lung bases demonstrate no significant appreciable abnormality. The heart size is within normal limits. No pleural or pericardial effusion.  Circumferential distal esophageal wall thickening. Nonspecific gastric distension.  Diffuse low attenuation of the liver is nonspecific post contrast however can be seen with hepatic steatosis.  Decompressed  gallbladder.  No biliary ductal dilatation.  Unremarkable spleen, pancreas, adrenal glands.  Symmetric renal enhancement.  No hydronephrosis or hydroureter.  No bowel obstruction.  No CT evidence for colitis.  Normal appendix.  No free intraperitoneal air or fluid.  No lymphadenopathy.  There is scattered atherosclerotic calcification of the aorta and its branches. No aneurysmal dilatation.  Thin-walled bladder.  No acute osseous finding.  IMPRESSION: Circumferential distal esophageal wall thickening can be seen in the setting of gastroesophageal reflux disease or esophagitis. Nonspecific gastric distension.  Correlate clinically if concerned for gastroparesis.  No bowel obstruction.  Despite the elevated lipase, pancreas has a normal CT appearance.  Original Report Authenticated By: ANDREW J. DELGAIZO, M.D.   Dg Chest Portable 1 View  03/06/2012  *RADIOLOGY REPORT*  Clinical Data: Pain.  PORTABLE CHEST - 1 VIEW  Comparison: Plain film chest 01/22/2012.  Findings: Lungs are clear.  Heart size is normal.  No pneumothorax or effusion.  IMPRESSION: No acute disease.  Original Report Authenticated By: THOMAS L. D'ALESSIO, M.D.      

## 2012-04-24 NOTE — Op Note (Signed)
[facility] [address] [place], [place]  [address]  ENDOSCOPY PROCEDURE REPORT  PATIENT:  Dustin Cruz, Dustin Cruz  MR#:  [id number] BIRTHDATE:  04/08/1966, 45 yrs. old  GENDER:  male  ENDOSCOPIST:  R. Roetta Sessions, MD FACP [facility] Referred by:   self  PROCEDURE DATE:  04/24/2012 PROCEDURE:  EGD with Elease Hashimoto dilation followed by gastric biopsy  INDICATIONS:   esophageal dysphagia. Abnormal distal esophagus on CT  INFORMED CONSENT:   The risks, benefits, limitations, alternatives and imponderables have been discussed.  The potential for biopsy, esophogeal dilation, etc. have also been reviewed.  Questions have been answered.  All parties agreeable.  Please see the history and physical in the medical record for more information.  MEDICATIONS:    Demerol 75 mg IV and Versed 4 mg IV in divided doses. Cetacaine spray  DESCRIPTION OF PROCEDURE:   The [id number]-[id number] ([id number]) endoscope was introduced through the mouth and advanced to the second portion of the duodenum without difficulty or limitations.  The mucosal surfaces were surveyed very carefully during advancement of the scope and upon withdrawal.  Retroflexion view of the proximal stomach and esophagogastric junction was performed.  <<PROCEDUREIMAGES>>  FINDINGS:  Distal esophageal erosions within 2 cm the EG junction. Patent esophagus throughout its course. No Barrett's esophagus. Stomach empty. Small hiatal hernia. Gastric erosions involving primarily the body of the stomach. No ulcer or infiltrating process. Pylorus patent and easily traversed. Examination of the first and second portion of the duodenum revealed only a couple of bulbar erosions  THERAPEUTIC / DIAGNOSTIC MANEUVERS PERFORMED:  A 56 French Maloney dilator was passed to full insertion easily. A look back revealed no apparent complication with this maneuver. Subsequently, biopsies of the gastric mucosa were taken for histologic study.  COMPLICATIONS:    None  IMPRESSION:        Mild erosive reflux esophagitis. Status post passage of a Maloney dilator. A small hiatal hernia. Gastric and bulbar erosions-status post gastric biopsy  RECOMMENDATIONS:  Continue Dexilant 60 mg orally daily indefinitely. CT negative for cause of left-sided abdominal pain. Minimally elevated lipase previously but no evidence of pancreatitis radiographically (subsequently, lipase has normalized) . He has a history of melanoma and is far overdue to followup with his oncologist. Given that melanoma can behave in strange ways over time, I strongly recommend he followup with his oncologist as soon as can be arranged for them to further evaluate his left-sided abdominal pain and decide if further imaging i.e. PET scanning etc. would be appropriate. Patient is to resume his Effient tomorrow.  We'll follow up on pathology. Office visit with [place] in 6 months.  ______________________________ R. Roetta Sessions, MD Caleen Essex  CC:  n. eSIGNED:   R. Roetta Sessions at 04/24/2012 11:13 AM  Susanne Greenhouse, [phone]

## 2012-04-26 ENCOUNTER — Encounter: Payer: Self-pay | Admitting: Internal Medicine

## 2012-04-26 ENCOUNTER — Encounter: Payer: Self-pay | Admitting: *Deleted

## 2012-04-29 ENCOUNTER — Encounter (HOSPITAL_COMMUNITY): Payer: Self-pay | Admitting: Internal Medicine

## 2012-05-04 ENCOUNTER — Encounter (HOSPITAL_COMMUNITY): Payer: Self-pay | Admitting: *Deleted

## 2012-05-04 ENCOUNTER — Emergency Department (HOSPITAL_COMMUNITY)
Admission: EM | Admit: 2012-05-04 | Discharge: 2012-05-05 | Disposition: A | Discharge status: Home / Self Care | Payer: Medicaid Other | Attending: Emergency Medicine | Admitting: Emergency Medicine

## 2012-05-04 DIAGNOSIS — R112 Nausea with vomiting, unspecified: Secondary | ICD-10-CM | POA: Insufficient documentation

## 2012-05-04 DIAGNOSIS — I10 Essential (primary) hypertension: Secondary | ICD-10-CM | POA: Insufficient documentation

## 2012-05-04 DIAGNOSIS — Z79899 Other long term (current) drug therapy: Secondary | ICD-10-CM | POA: Insufficient documentation

## 2012-05-04 DIAGNOSIS — E785 Hyperlipidemia, unspecified: Secondary | ICD-10-CM | POA: Insufficient documentation

## 2012-05-04 DIAGNOSIS — R197 Diarrhea, unspecified: Secondary | ICD-10-CM | POA: Insufficient documentation

## 2012-05-04 DIAGNOSIS — J40 Bronchitis, not specified as acute or chronic: Secondary | ICD-10-CM | POA: Insufficient documentation

## 2012-05-04 DIAGNOSIS — R109 Unspecified abdominal pain: Secondary | ICD-10-CM | POA: Insufficient documentation

## 2012-05-04 DIAGNOSIS — G8929 Other chronic pain: Secondary | ICD-10-CM

## 2012-05-04 DIAGNOSIS — E119 Type 2 diabetes mellitus without complications: Secondary | ICD-10-CM | POA: Insufficient documentation

## 2012-05-04 DIAGNOSIS — Z794 Long term (current) use of insulin: Secondary | ICD-10-CM | POA: Insufficient documentation

## 2012-05-04 NOTE — ED Notes (Signed)
Reported by ems pt w/ abdominal pain, N/V. Family members advised EMS pt vomiting coffee ground material. Pt states pain started today around 1800.

## 2012-05-05 ENCOUNTER — Emergency Department (HOSPITAL_COMMUNITY): Payer: Medicaid Other

## 2012-05-05 LAB — CBC WITH DIFFERENTIAL/PLATELET
Basophils Relative: 0 % (ref 0–1)
Eosinophils Absolute: 0 10*3/uL (ref 0.0–0.7)
HCT: 37.4 % — ABNORMAL LOW (ref 39.0–52.0)
Hemoglobin: 13 g/dL (ref 13.0–17.0)
MCH: 31.9 pg (ref 26.0–34.0)
MCHC: 34.8 g/dL (ref 30.0–36.0)
Monocytes Absolute: 1 10*3/uL (ref 0.1–1.0)
Monocytes Relative: 9 % (ref 3–12)

## 2012-05-05 LAB — COMPREHENSIVE METABOLIC PANEL
ALT: 12 U/L (ref 0–53)
AST: 9 U/L (ref 0–37)
Alkaline Phosphatase: 51 U/L (ref 39–117)
CO2: 26 mEq/L (ref 19–32)
Calcium: 9.8 mg/dL (ref 8.4–10.5)
Chloride: 99 mEq/L (ref 96–112)
GFR calc Af Amer: >90 mL/min (ref >90)
GFR calc non Af Amer: >90 mL/min (ref >90)
Glucose, Bld: 232 mg/dL — ABNORMAL HIGH (ref 70–99)
Potassium: 4.3 mEq/L (ref 3.5–5.1)

## 2012-05-05 MED ORDER — DM-GUAIFENESIN ER 30-600 MG PO TB12
1.0000 | ORAL_TABLET | Freq: Two times a day (BID) | ORAL | Status: DC
  Administered 2012-05-05: 1 via ORAL
  Filled 2012-05-05: qty 1

## 2012-05-05 MED ORDER — SODIUM CHLORIDE 0.9 % IV SOLN
1000.0000 mL | Freq: Once | INTRAVENOUS | Status: AC
  Administered 2012-05-05: 1000 mL via INTRAVENOUS

## 2012-05-05 MED ORDER — AMOXICILLIN 500 MG PO CAPS: 500.0000 mg | ORAL_CAPSULE | Freq: Three times a day (TID) | ORAL | Status: AC

## 2012-05-05 MED ORDER — PROMETHAZINE HCL 25 MG/ML IJ SOLN
25.0000 mg | Freq: Once | INTRAMUSCULAR | Status: AC
  Administered 2012-05-05: 25 mg via INTRAVENOUS
  Filled 2012-05-05: qty 1

## 2012-05-05 MED ORDER — METOCLOPRAMIDE HCL 5 MG/ML IJ SOLN
10.0000 mg | Freq: Once | INTRAMUSCULAR | Status: AC
  Administered 2012-05-05: 10 mg via INTRAVENOUS
  Filled 2012-05-05: qty 2

## 2012-05-05 MED ORDER — GUAIFENESIN ER 1200 MG PO TB12: 1.0000 | ORAL_TABLET | Freq: Two times a day (BID) | ORAL | Status: DC

## 2012-05-05 MED ORDER — PROMETHAZINE HCL 25 MG RE SUPP: 25.0000 mg | Freq: Four times a day (QID) | RECTAL | Status: DC | PRN

## 2012-05-05 MED ORDER — DIPHENHYDRAMINE HCL 50 MG/ML IJ SOLN
25.0000 mg | Freq: Once | INTRAMUSCULAR | Status: AC
  Administered 2012-05-05: 25 mg via INTRAVENOUS
  Filled 2012-05-05: qty 1

## 2012-05-05 MED ORDER — SODIUM CHLORIDE 0.9 % IV SOLN
1000.0000 mL | INTRAVENOUS | Status: DC
  Administered 2012-05-05: 1000 mL via INTRAVENOUS

## 2012-05-05 MED ORDER — PANTOPRAZOLE SODIUM 40 MG IV SOLR
40.0000 mg | Freq: Once | INTRAVENOUS | Status: AC
  Administered 2012-05-05: 40 mg via INTRAVENOUS
  Filled 2012-05-05: qty 40

## 2012-05-05 NOTE — ED Provider Notes (Signed)
History     CSN: [id number]  Arrival date & time 05/04/12  2339   First MD Initiated Contact with Patient 05/05/12 0000      Chief Complaint  Patient presents with  . Abdominal Pain  . Nausea  . Emesis    (Consider location/radiation/quality/duration/timing/severity/associated sxs/prior treatment) HPI  Patient states about 6 PM he started having nausea and vomiting. He states he vomited about 5 times. He denies blood but states he did have some brown fluid. He states he had some abdominal pain and indicates the pain is diffuse. He also has had some diarrhea 2 or 3 times its brown. He denies having black stools or fever. He states all of his friends have been sick with similar symptoms. Patient did not tell me however with on reviewing his chart his has been having chronic abdominal pain for the past 2-3 months. He just had a endoscopy done on July 17 which showed some esophageal erosions and some gastric erosions in the body of the stomach. He also had a history of melanoma and it's unclear to me whether he was in his stomach or somewhere else. Patient states she's had some wheezing today and having a cough with postnasal drip today. History is somewhat unclear initially he told me he had no abdominal pain then he said he did have abdominal pain. He also initially said he did not take any of his medicine today because of vomiting but then he said he only started having vomiting in the evening.  PCP PCP [facility] Department   Past Medical History  Diagnosis Date  . Diabetes mellitus     Type 2  . Hyperlipidemia   . Depression   . Coronary artery disease     s/p BMS to Ramus 10/2009;  Cath 01/22/12 prox 30-40% LAD, LCx ramus w/ hazy 70-80% in-stent restenosis, EF 60% treated medically  . HTN (hypertension)   . Tobacco abuse   . Obesity   . Anxiety   . Melanoma 2007    surgery at [facility]  . Myocardial infarction   . GERD (gastroesophageal reflux disease)   .  Hyperlipidemia     Past Surgical History  Procedure Date  . Melanoma surgery 2007    [facility], removed lymph nodes under arm as well  . Cardiac catheterization     with stent  . Esophagogastroduodenoscopy 04/24/2012    Procedure: ESOPHAGOGASTRODUODENOSCOPY (EGD);  Surgeon: Corbin Ade, MD;  Location: [facility];  Service: Endoscopy;  Laterality: N/A;    Family History  Problem Relation Age of Onset  . Lung cancer    . Heart disease    . Other      not real familiar with family history    History  Substance Use Topics  . Smoking status: Current Everyday Smoker -- 0.5 packs/day for 30 years    Types: Cigarettes    Last Attempt to Quit: 03/15/2012  . Smokeless tobacco: Never Used  . 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 MR   Review of Systems  All other systems reviewed and are negative.    Allergies  Review of patient's allergies indicates no known allergies.  Home Medications   Current Outpatient Rx  Name Route Sig Dispense Refill  . ACETAMINOPHEN 325 MG PO TABS Oral Take 2 tablets (650 mg total) by mouth every 6 (six) hours as needed for pain.    [name]  ASPIRIN 325 MG PO TBEC Oral Take 325 mg by mouth daily.    [name] CITALOPRAM HYDROBROMIDE 40 MG PO TABS Oral Take 20 mg by mouth daily.     [name] GABAPENTIN 400 MG PO CAPS Oral Take 400 mg by mouth 2 (two) times daily.     [name] GEMFIBROZIL 600 MG PO TABS Oral Take 600 mg by mouth 2 (two) times daily before a meal.      . INSULIN GLARGINE 100 UNIT/ML [place] SOLN Subcutaneous Inject 54 Units into the skin at bedtime.     [name] LORATADINE 10 MG PO CAPS Oral Take 1 capsule by mouth daily.      [name] METFORMIN HCL 1000 MG PO TABS Oral Take 1 tablet (1,000 mg total) by mouth 2 (two) times daily with a meal. HOLD for 48 hours, restart on April 18th.    . METOPROLOL SUCCINATE ER 25 MG PO TB24 Oral Take 1 tablet (25 mg total) by mouth daily. 30 tablet 12  . NAPROXEN 500 MG PO TABS Oral Take  500 mg by mouth as needed. pain    . NITROGLYCERIN 0.4 MG SL SUBL Sublingual Place 1 tablet (0.4 mg total) under the tongue every 5 (five) minutes as needed. For chest pain. 25 tablet 3  . PRASUGREL HCL 10 MG PO TABS Oral Take 1 tablet (10 mg total) by mouth daily. 30 tablet 12  . ROSUVASTATIN CALCIUM 20 MG PO TABS Oral Take 20 mg by mouth at bedtime.    . TRAZODONE HCL 50 MG PO TABS Oral Take 50 mg by mouth at bedtime.      . DEXLANSOPRAZOLE 60 MG PO CPDR Oral Take 1 capsule (60 mg total) by mouth daily. 20 capsule 0    BP 124/63  Pulse 82  Temp 99.6 F (37.6 C) (Oral)  Resp 18  Ht 6\' 2"  (1.88 m)  Wt 215 lb (97.523 kg)  BMI 27.60 kg/m2  SpO2 96%  Vital signs normal    Physical Exam  Nursing note and vitals reviewed. Constitutional: He is oriented to person, place, and time. He appears well-developed and well-nourished.  Non-toxic appearance. He does not appear ill. No distress.  HENT:  Head: Normocephalic and atraumatic.  Right Ear: External ear normal.  Left Ear: External ear normal.  Nose: Nose normal. No mucosal edema or rhinorrhea.  Mouth/Throat: Oropharynx is clear and moist and mucous membranes are normal. No dental abscesses or uvula swelling.  Eyes: Conjunctivae and EOM are normal. Pupils are equal, round, and reactive to light.  Neck: Normal range of motion and full passive range of motion without pain. Neck supple.  Cardiovascular: Normal rate, regular rhythm and normal heart sounds.  Exam reveals no gallop and no friction rub.   No murmur heard. Pulmonary/Chest: Effort normal and breath sounds normal. No respiratory distress. He has no wheezes. He has no rhonchi. He has no rales. He exhibits no tenderness and no crepitus.  Abdominal: Soft. Normal appearance and bowel sounds are normal. He exhibits no distension. There is tenderness. There is no rebound and no guarding.       Mild upper abdominal tenderness without guarding or rebound  Musculoskeletal: Normal range of  motion. He exhibits no edema and no tenderness.       Moves all extremities well.   Neurological: He is alert and oriented to person, place, and time. He has normal strength. No cranial nerve deficit.  Skin: Skin is warm, dry and intact. No rash noted. No erythema. No  pallor.  Psychiatric: He has a normal mood and affect. His speech is normal and behavior is normal. His mood appears not anxious.    ED Course  Procedures (including critical care time)   Medications  0.9 %  sodium chloride infusion (0 mL Intravenous Stopped 05/05/12 0145)    Followed by  0.9 %  sodium chloride infusion (1000 mL Intravenous New Bag/Given 05/05/12 0145)  dextromethorphan-guaiFENesin (MUCINEX DM) 30-600 MG per 12 hr tablet 1 tablet (1 tablet Oral Given 05/05/12 0706)  promethazine (PHENERGAN) 25 MG suppository (not administered)  pantoprazole (PROTONIX) injection 40 mg (40 mg Intravenous Given 05/05/12 0053)  metoCLOPramide (REGLAN) injection 10 mg (10 mg Intravenous Given 05/05/12 0053)  diphenhydrAMINE (BENADRYL) injection 25 mg (25 mg Intravenous Given 05/05/12 0053)  promethazine (PHENERGAN) injection 25 mg (25 mg Intravenous Given 05/05/12 0505)    Patient was in the emergency department for a long time. He had persistent complaints of nausea. However patient had no vomiting or diarrhea during his ED visit.  PT is noted to have frequent episodes of coughing. Treated with mucinex with good relief.   Results for orders placed during the hospital encounter of 05/04/12  CBC WITH DIFFERENTIAL      Component Value Range   WBC 11.0 (*) 4.0 - 10.5 K/uL   RBC 4.07 (*) 4.22 - 5.81 MIL/uL   Hemoglobin 13.0  13.0 - 17.0 g/dL   HCT 16.1 (*) 09.6 - 04.5 %   MCV 91.9  78.0 - 100.0 fL   MCH 31.9  26.0 - 34.0 pg   MCHC 34.8  30.0 - 36.0 g/dL   RDW 40.9  81.1 - 91.4 %   Platelets 360  150 - 400 K/uL   Neutrophils Relative 82 (*) 43 - 77 %   Neutro Abs 9.0 (*) 1.7 - 7.7 K/uL   Lymphocytes Relative 9 (*) 12 - 46 %    Lymphs Abs 1.0  0.7 - 4.0 K/uL   Monocytes Relative 9  3 - 12 %   Monocytes Absolute 1.0  0.1 - 1.0 K/uL   Eosinophils Relative 0  0 - 5 %   Eosinophils Absolute 0.0  0.0 - 0.7 K/uL   Basophils Relative 0  0 - 1 %   Basophils Absolute 0.0  0.0 - 0.1 K/uL  COMPREHENSIVE METABOLIC PANEL      Component Value Range   Sodium 135  135 - 145 mEq/L   Potassium 4.3  3.5 - 5.1 mEq/L   Chloride 99  96 - 112 mEq/L   CO2 26  19 - 32 mEq/L   Glucose, Bld 232 (*) 70 - 99 mg/dL   BUN 16  6 - 23 mg/dL   Creatinine, Ser 7.82  0.50 - 1.35 mg/dL   Calcium 9.8  8.4 - 95.6 mg/dL   Total Protein 7.3  6.0 - 8.3 g/dL   Albumin 4.1  3.5 - 5.2 g/dL   AST 9  0 - 37 U/L   ALT 12  0 - 53 U/L   Alkaline Phosphatase 51  39 - 117 U/L   Total Bilirubin 0.3  0.3 - 1.2 mg/dL   GFR calc non Af Amer >90  >90 mL/min   GFR calc Af Amer >90  >90 mL/min  LIPASE, BLOOD      Component Value Range   Lipase 12  11 - 59 U/L   Laboratory interpretation all normal except hyperglycemia      Dg Chest 2 View  05/05/2012  *RADIOLOGY  REPORT*  Clinical Data: 46 year old male with cough, pain and vomiting.  CHEST - 2 VIEW  Comparison: 04/14/1999 for 13 and prior chest radiographs  Findings: The cardiomediastinal silhouette is unremarkable. A 7 mm nodular density overlying the left upper lung is identified, not visualized on prior studies. There is no evidence of airspace disease, pleural effusion, pulmonary edema or pneumothorax. Left basilar scarring is present. No acute or suspicious bony abnormalities are present.  IMPRESSION: Possible left upper lobe nodule - chest CT with contrast is recommended.  No evidence of acute abnormality.  Original Report Authenticated By: Rosendo Gros, M.D.     1. Nausea vomiting and diarrhea   2. Bronchitis   3. Chronic abdominal pain    New Prescriptions   AMOXICILLIN (AMOXIL) 500 MG CAPSULE    Take 1 capsule (500 mg total) by mouth 3 (three) times daily.   GUAIFENESIN 1200 MG TB12    Take 1  tablet (1,200 mg total) by mouth 2 (two) times daily.   PROMETHAZINE (PHENERGAN) 25 MG SUPPOSITORY    Place 1 suppository (25 mg total) rectally every 6 (six) hours as needed for nausea.    Plan discharge  Devoria Albe, MD, FACEP    MDM          Ward Givens, MD 05/05/12 [phone]

## 2012-05-07 ENCOUNTER — Ambulatory Visit: Payer: Medicaid Other | Admitting: Cardiology

## 2012-05-08 ENCOUNTER — Encounter (HOSPITAL_COMMUNITY): Payer: Self-pay | Admitting: *Deleted

## 2012-05-08 ENCOUNTER — Emergency Department (HOSPITAL_COMMUNITY): Payer: Medicaid Other

## 2012-05-08 ENCOUNTER — Emergency Department (HOSPITAL_COMMUNITY)
Admission: EM | Admit: 2012-05-08 | Discharge: 2012-05-08 | Disposition: A | Discharge status: Home / Self Care | Payer: Medicaid Other | Attending: Emergency Medicine | Admitting: Emergency Medicine

## 2012-05-08 DIAGNOSIS — E119 Type 2 diabetes mellitus without complications: Secondary | ICD-10-CM | POA: Insufficient documentation

## 2012-05-08 DIAGNOSIS — J40 Bronchitis, not specified as acute or chronic: Secondary | ICD-10-CM | POA: Insufficient documentation

## 2012-05-08 DIAGNOSIS — Z79899 Other long term (current) drug therapy: Secondary | ICD-10-CM | POA: Insufficient documentation

## 2012-05-08 DIAGNOSIS — E785 Hyperlipidemia, unspecified: Secondary | ICD-10-CM | POA: Insufficient documentation

## 2012-05-08 DIAGNOSIS — K219 Gastro-esophageal reflux disease without esophagitis: Secondary | ICD-10-CM | POA: Insufficient documentation

## 2012-05-08 DIAGNOSIS — J9801 Acute bronchospasm: Secondary | ICD-10-CM | POA: Insufficient documentation

## 2012-05-08 DIAGNOSIS — Z794 Long term (current) use of insulin: Secondary | ICD-10-CM | POA: Insufficient documentation

## 2012-05-08 DIAGNOSIS — Z7982 Long term (current) use of aspirin: Secondary | ICD-10-CM | POA: Insufficient documentation

## 2012-05-08 DIAGNOSIS — F988 Other specified behavioral and emotional disorders with onset usually occurring in childhood and adolescence: Secondary | ICD-10-CM | POA: Insufficient documentation

## 2012-05-08 DIAGNOSIS — I10 Essential (primary) hypertension: Secondary | ICD-10-CM | POA: Insufficient documentation

## 2012-05-08 DIAGNOSIS — I252 Old myocardial infarction: Secondary | ICD-10-CM | POA: Insufficient documentation

## 2012-05-08 DIAGNOSIS — F17200 Nicotine dependence, unspecified, uncomplicated: Secondary | ICD-10-CM | POA: Insufficient documentation

## 2012-05-08 LAB — COMPREHENSIVE METABOLIC PANEL
AST: 10 U/L (ref 0–37)
Albumin: 3.8 g/dL (ref 3.5–5.2)
Calcium: 9.5 mg/dL (ref 8.4–10.5)
Creatinine, Ser: 0.86 mg/dL (ref 0.50–1.35)
GFR calc non Af Amer: >90 mL/min (ref >90)
Sodium: 135 mEq/L (ref 135–145)
Total Protein: 7.1 g/dL (ref 6.0–8.3)

## 2012-05-08 LAB — URINE MICROSCOPIC-ADD ON

## 2012-05-08 LAB — URINALYSIS, ROUTINE W REFLEX MICROSCOPIC
Glucose, UA: 100 mg/dL — AB
Specific Gravity, Urine: 1.01 (ref 1.005–1.030)
Urobilinogen, UA: 0.2 mg/dL (ref 0.0–1.0)
pH: 5.5 (ref 5.0–8.0)

## 2012-05-08 LAB — CBC WITH DIFFERENTIAL/PLATELET
Basophils Absolute: 0 10*3/uL (ref 0.0–0.1)
Basophils Relative: 1 % (ref 0–1)
Eosinophils Absolute: 0.1 10*3/uL (ref 0.0–0.7)
Eosinophils Relative: 1 % (ref 0–5)
HCT: 37.8 % — ABNORMAL LOW (ref 39.0–52.0)
MCHC: 35.2 g/dL (ref 30.0–36.0)
MCV: 91.1 fL (ref 78.0–100.0)
Monocytes Absolute: 0.8 10*3/uL (ref 0.1–1.0)
Platelets: 402 10*3/uL — ABNORMAL HIGH (ref 150–400)
RDW: 12.7 % (ref 11.5–15.5)

## 2012-05-08 MED ORDER — ALBUTEROL SULFATE HFA 108 (90 BASE) MCG/ACT IN AERS: 1.0000 | INHALATION_SPRAY | Freq: Four times a day (QID) | RESPIRATORY_TRACT | Status: DC | PRN

## 2012-05-08 MED ORDER — IPRATROPIUM BROMIDE 0.02 % IN SOLN
0.5000 mg | Freq: Once | RESPIRATORY_TRACT | Status: AC
  Administered 2012-05-08: 0.5 mg via RESPIRATORY_TRACT
  Filled 2012-05-08: qty 2.5

## 2012-05-08 MED ORDER — IOHEXOL 300 MG/ML  SOLN
80.0000 mL | Freq: Once | INTRAMUSCULAR | Status: AC | PRN
  Administered 2012-05-08: 80 mL via INTRAVENOUS

## 2012-05-08 MED ORDER — SODIUM CHLORIDE 0.9 % IV BOLUS (SEPSIS)
1000.0000 mL | Freq: Once | INTRAVENOUS | Status: AC
  Administered 2012-05-08: 1000 mL via INTRAVENOUS

## 2012-05-08 MED ORDER — ALBUTEROL SULFATE (5 MG/ML) 0.5% IN NEBU
2.5000 mg | INHALATION_SOLUTION | Freq: Once | RESPIRATORY_TRACT | Status: AC
  Administered 2012-05-08: 2.5 mg via RESPIRATORY_TRACT
  Filled 2012-05-08: qty 0.5

## 2012-05-08 NOTE — ED Notes (Signed)
Urine sample obtained, sent to lab, update given to pt on plan of care

## 2012-05-08 NOTE — ED Notes (Signed)
Dr. Estell Harpin in room talking with pt

## 2012-05-08 NOTE — ED Notes (Signed)
See triage note.

## 2012-05-08 NOTE — ED Notes (Signed)
Pt brought to er via [facility] with c/o bronchitis not getting any better, pt states that he was seen in er on 05/04/2012, dx with bronchitis, is taking antibiotics but states that he is still sob, generalized weakness, not wanting to eat,

## 2012-05-08 NOTE — ED Provider Notes (Signed)
History   This chart was scribed for Dustin Lennert, MD by [name] . The patient was seen in room APA19/AP[facility]19. Patient's care was started at 1327.    CSN: [id number]  Arrival date & time 05/08/12  1322   First MD Initiated Contact with Patient 05/08/12 1327      Chief Complaint  Patient presents with  . Bronchitis  . Shortness of Breath    (Consider location/radiation/quality/duration/timing/severity/associated sxs/prior treatment) HPI Comments: Dustin Cruz is a 46 y.o. male who presents to the Emergency Department complaining of constant, moderate SOB with associated generalized weakness, cough, decreased appetite, fever and wheezing for the past 4 days. Pt states that he was seen here 05/04/12 and told he had bronchitis and started on Amoxicillin with minimal relief.    Patient is a 46 y.o. male presenting with shortness of breath. The history is provided by the patient.  Shortness of Breath  The current episode started 5 to 7 days ago. The onset was gradual. The problem occurs continuously. The problem has been unchanged. The problem is moderate. Nothing relieves the symptoms. Associated symptoms include a fever, cough, shortness of breath and wheezing. Pertinent negatives include no chest pain.     Past Medical History  Diagnosis Date  . Diabetes mellitus     Type 2  . Hyperlipidemia   . Depression   . Coronary artery disease     s/p BMS to Ramus 10/2009;  Cath 01/22/12 prox 30-40% LAD, LCx ramus w/ hazy 70-80% in-stent restenosis, EF 60% treated medically  . HTN (hypertension)   . Tobacco abuse   . Obesity   . Anxiety   . Melanoma 2007    surgery at [facility]  . Myocardial infarction   . GERD (gastroesophageal reflux disease)   . Hyperlipidemia     Past Surgical History  Procedure Date  . Melanoma surgery 2007    [facility], removed lymph nodes under arm as well  . Cardiac catheterization     with stent  . Esophagogastroduodenoscopy 04/24/2012   Procedure: ESOPHAGOGASTRODUODENOSCOPY (EGD);  Surgeon: Corbin Ade, MD;  Location: [facility];  Service: Endoscopy;  Laterality: N/A;    Family History  Problem Relation Age of Onset  . Lung cancer    . Heart disease    . Other      not real familiar with family history    History  Substance Use Topics  . Smoking status: Current Everyday Smoker -- 0.5 packs/day for 30 years    Types: Cigarettes    Last Attempt to Quit: 03/15/2012  . Smokeless tobacco: Never Used  . 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 Negative for fatigue.  HENT: Negative for congestion, sinus pressure and ear discharge.   Eyes: Negative for discharge.  Respiratory: Positive for cough, shortness of breath and wheezing.   Cardiovascular: Negative for chest pain.  Gastrointestinal: Negative for abdominal pain and diarrhea.  Genitourinary: Negative for frequency and hematuria.  Musculoskeletal: Negative for back pain.  Skin: Negative for rash.  Neurological: Positive for weakness. Negative for seizures and headaches.  Hematological: Negative.   Psychiatric/Behavioral: Negative for hallucinations.  All other systems reviewed and are negative.    Allergies  Review of patient's allergies indicates no known allergies.  Home Medications   Current Outpatient Rx  Name Route Sig Dispense Refill  . ACETAMINOPHEN 325 MG  PO TABS Oral Take 2 tablets (650 mg total) by mouth every 6 (six) hours as needed for pain.    [name] AMOXICILLIN 500 MG PO CAPS Oral Take 1 capsule (500 mg total) by mouth 3 (three) times daily. 21 capsule 0  . ASPIRIN 325 MG PO TBEC Oral Take 325 mg by mouth daily.    [name] CITALOPRAM HYDROBROMIDE 40 MG PO TABS Oral Take 20 mg by mouth daily.     . DEXLANSOPRAZOLE 60 MG PO CPDR Oral Take 1 capsule (60 mg total) by mouth daily. 20 capsule 0  . GABAPENTIN 400 MG PO CAPS Oral Take 400 mg by mouth 2  (two) times daily.     [name] GEMFIBROZIL 600 MG PO TABS Oral Take 600 mg by mouth 2 (two) times daily before a meal.      . GUAIFENESIN ER 1200 MG PO TB12 Oral Take 1 tablet (1,200 mg total) by mouth 2 (two) times daily. 20 each 0  . INSULIN GLARGINE 100 UNIT/ML [place] SOLN Subcutaneous Inject 54 Units into the skin at bedtime.     [name] LORATADINE 10 MG PO CAPS Oral Take 1 capsule by mouth daily.      [name] METFORMIN HCL 1000 MG PO TABS Oral Take 1 tablet (1,000 mg total) by mouth 2 (two) times daily with a meal. HOLD for 48 hours, restart on April 18th.    . METOPROLOL SUCCINATE ER 25 MG PO TB24 Oral Take 1 tablet (25 mg total) by mouth daily. 30 tablet 12  . NAPROXEN 500 MG PO TABS Oral Take 500 mg by mouth as needed. pain    . NITROGLYCERIN 0.4 MG SL SUBL Sublingual Place 1 tablet (0.4 mg total) under the tongue every 5 (five) minutes as needed. For chest pain. 25 tablet 3  . PRASUGREL HCL 10 MG PO TABS Oral Take 1 tablet (10 mg total) by mouth daily. 30 tablet 12  . PROMETHAZINE HCL 25 MG RE SUPP Rectal Place 1 suppository (25 mg total) rectally every 6 (six) hours as needed for nausea. 12 each 0  . ROSUVASTATIN CALCIUM 20 MG PO TABS Oral Take 20 mg by mouth at bedtime.    . TRAZODONE HCL 50 MG PO TABS Oral Take 50 mg by mouth at bedtime.        BP 140/82  Pulse 78  Temp 98.8 F (37.1 C)  Resp 20  Ht 6\' 2"  (1.88 m)  Wt 215 lb (97.523 kg)  BMI 27.60 kg/m2  SpO2 98%  Physical Exam  Constitutional: He is oriented to person, place, and time. He appears well-developed.  HENT:  Head: Normocephalic and atraumatic.  Eyes: Conjunctivae and EOM are normal. No scleral icterus.  Neck: Neck supple. No thyromegaly present.  Cardiovascular: Normal rate, regular rhythm and normal heart sounds.  Exam reveals no gallop and no friction rub.   No murmur heard. Pulmonary/Chest: Effort normal. No stridor. He has wheezes. He has no rales. He exhibits no tenderness.       Moderate wheezes bilaterally.   Abdominal:  He exhibits no distension. There is no tenderness. There is no rebound.  Musculoskeletal: Normal range of motion. He exhibits no edema.  Lymphadenopathy:    He has no cervical adenopathy.  Neurological: He is oriented to person, place, and time. Coordination normal.  Skin: No rash noted. No erythema.  Psychiatric: He has a normal mood and affect. His behavior is normal.    ED Course  Procedures (including critical care time)  DIAGNOSTIC  STUDIES: Oxygen Saturation is 98% on room air, normal by my interpretation.    COORDINATION OF CARE:  13:41-Discussed planned course of treatment with the patient, who is agreeable at this time.   13:45-Medication Orders: Ipratropium (Atrovent) nebulizer solution 0.5 mg-once; Albuterol (Proventil) (5 mg/mL) 0.5% nebulizer solution 2.5 mg-once; Sodium chloride 0.9% bolus 1,000 mL-once.   Labs Reviewed - No data to display No results found.   No diagnosis found.    MDM         Dustin Lennert, MD 05/08/12 1700

## 2012-05-09 NOTE — Progress Notes (Signed)
REVIEWED.  

## 2012-05-09 NOTE — Progress Notes (Addendum)
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 RECHECK CBC IN 3-4 MOS.  REVIEWED.   PLEASE CALL PT. HIS BLOOD COUNT IS NORMAL. OPV W/ SLF IN 6 MOS.

## 2012-05-09 NOTE — Progress Notes (Signed)
See SLF recommendations. Please arrange for CBC in 3-4 months as per SLF.

## 2012-05-10 ENCOUNTER — Other Ambulatory Visit: Payer: Self-pay

## 2012-05-10 DIAGNOSIS — D649 Anemia, unspecified: Secondary | ICD-10-CM

## 2012-05-10 NOTE — Progress Notes (Signed)
Pt called and was informed.  

## 2012-05-10 NOTE — Progress Notes (Signed)
LMOM to call. Lab order on file for 08/23/2012.

## 2012-05-14 ENCOUNTER — Telehealth: Payer: Self-pay | Admitting: *Deleted

## 2012-05-14 NOTE — Telephone Encounter (Addendum)
Patient was placed on Crestor in the hospital and has not tried anything else.  Insurance does not want to pay on this med.  Please advise.  Note during hospitalization indicates that he was receiving rosuvastatin at no charge from the health department prior to admission.  Simvastatin 40 mg per day or atorvastatin 40 mg per day can be substituted if either of these provide a better solution for the patient.

## 2012-05-20 ENCOUNTER — Other Ambulatory Visit: Payer: Self-pay | Admitting: *Deleted

## 2012-05-20 MED ORDER — SIMVASTATIN 40 MG PO TABS: 40.0000 mg | ORAL_TABLET | Freq: Every day | ORAL | Status: DC

## 2012-05-20 NOTE — Telephone Encounter (Signed)
Patient notified to stop Crestor and begin Simvastatin 40 mg daily.  Verbalizes understanding and will sent script to [facility] in [place].

## 2012-07-02 ENCOUNTER — Encounter (HOSPITAL_COMMUNITY): Payer: Self-pay | Admitting: Emergency Medicine

## 2012-07-02 ENCOUNTER — Emergency Department (HOSPITAL_COMMUNITY): Payer: Medicaid Other

## 2012-07-02 ENCOUNTER — Other Ambulatory Visit: Payer: Self-pay

## 2012-07-02 ENCOUNTER — Inpatient Hospital Stay (HOSPITAL_COMMUNITY)
Admission: EM | Admit: 2012-07-02 | Discharge: 2012-07-05 | DRG: 074 | Disposition: A | Discharge status: Home / Self Care | Payer: Medicaid Other | Attending: Internal Medicine | Admitting: Internal Medicine

## 2012-07-02 DIAGNOSIS — E1149 Type 2 diabetes mellitus with other diabetic neurological complication: Principal | ICD-10-CM | POA: Diagnosis present

## 2012-07-02 DIAGNOSIS — I447 Left bundle-branch block, unspecified: Secondary | ICD-10-CM

## 2012-07-02 DIAGNOSIS — Z794 Long term (current) use of insulin: Secondary | ICD-10-CM

## 2012-07-02 DIAGNOSIS — Z720 Tobacco use: Secondary | ICD-10-CM

## 2012-07-02 DIAGNOSIS — R1319 Other dysphagia: Secondary | ICD-10-CM

## 2012-07-02 DIAGNOSIS — K219 Gastro-esophageal reflux disease without esophagitis: Secondary | ICD-10-CM | POA: Diagnosis present

## 2012-07-02 DIAGNOSIS — R1012 Left upper quadrant pain: Secondary | ICD-10-CM

## 2012-07-02 DIAGNOSIS — F32A Depression, unspecified: Secondary | ICD-10-CM

## 2012-07-02 DIAGNOSIS — F17200 Nicotine dependence, unspecified, uncomplicated: Secondary | ICD-10-CM | POA: Diagnosis present

## 2012-07-02 DIAGNOSIS — F329 Major depressive disorder, single episode, unspecified: Secondary | ICD-10-CM

## 2012-07-02 DIAGNOSIS — R079 Chest pain, unspecified: Secondary | ICD-10-CM | POA: Diagnosis present

## 2012-07-02 DIAGNOSIS — I2510 Atherosclerotic heart disease of native coronary artery without angina pectoris: Secondary | ICD-10-CM | POA: Diagnosis present

## 2012-07-02 DIAGNOSIS — Z7982 Long term (current) use of aspirin: Secondary | ICD-10-CM

## 2012-07-02 DIAGNOSIS — F419 Anxiety disorder, unspecified: Secondary | ICD-10-CM | POA: Diagnosis present

## 2012-07-02 DIAGNOSIS — R1310 Dysphagia, unspecified: Secondary | ICD-10-CM

## 2012-07-02 DIAGNOSIS — M79671 Pain in right foot: Secondary | ICD-10-CM

## 2012-07-02 DIAGNOSIS — E119 Type 2 diabetes mellitus without complications: Secondary | ICD-10-CM

## 2012-07-02 DIAGNOSIS — Z791 Long term (current) use of non-steroidal anti-inflammatories (NSAID): Secondary | ICD-10-CM

## 2012-07-02 DIAGNOSIS — R209 Unspecified disturbances of skin sensation: Secondary | ICD-10-CM

## 2012-07-02 DIAGNOSIS — R6881 Early satiety: Secondary | ICD-10-CM

## 2012-07-02 DIAGNOSIS — E7800 Pure hypercholesterolemia, unspecified: Secondary | ICD-10-CM

## 2012-07-02 DIAGNOSIS — E785 Hyperlipidemia, unspecified: Secondary | ICD-10-CM

## 2012-07-02 DIAGNOSIS — F411 Generalized anxiety disorder: Secondary | ICD-10-CM | POA: Diagnosis present

## 2012-07-02 DIAGNOSIS — E669 Obesity, unspecified: Secondary | ICD-10-CM

## 2012-07-02 DIAGNOSIS — N1831 Chronic kidney disease, stage 3a: Secondary | ICD-10-CM | POA: Diagnosis present

## 2012-07-02 DIAGNOSIS — K209 Esophagitis, unspecified without bleeding: Secondary | ICD-10-CM

## 2012-07-02 DIAGNOSIS — R200 Anesthesia of skin: Secondary | ICD-10-CM | POA: Diagnosis present

## 2012-07-02 DIAGNOSIS — I10 Essential (primary) hypertension: Secondary | ICD-10-CM

## 2012-07-02 DIAGNOSIS — Z9861 Coronary angioplasty status: Secondary | ICD-10-CM

## 2012-07-02 DIAGNOSIS — E1142 Type 2 diabetes mellitus with diabetic polyneuropathy: Secondary | ICD-10-CM | POA: Diagnosis present

## 2012-07-02 DIAGNOSIS — Z6827 Body mass index (BMI) 27.0-27.9, adult: Secondary | ICD-10-CM

## 2012-07-02 DIAGNOSIS — I252 Old myocardial infarction: Secondary | ICD-10-CM

## 2012-07-02 LAB — CBC WITH DIFFERENTIAL/PLATELET
Basophils Absolute: 0 10*3/uL (ref 0.0–0.1)
Eosinophils Absolute: 0.1 10*3/uL (ref 0.0–0.7)
Eosinophils Relative: 2 % (ref 0–5)
Lymphocytes Relative: 34 % (ref 12–46)
MCV: 93.5 fL (ref 78.0–100.0)
Platelets: 368 10*3/uL (ref 150–400)
RDW: 13 % (ref 11.5–15.5)
WBC: 5.8 10*3/uL (ref 4.0–10.5)

## 2012-07-02 LAB — D-DIMER, QUANTITATIVE: D-Dimer, Quant: <0.27 ug/mL-FEU (ref 0.00–0.48)

## 2012-07-02 LAB — COMPREHENSIVE METABOLIC PANEL
ALT: 12 U/L (ref 0–53)
AST: 11 U/L (ref 0–37)
Calcium: 9.6 mg/dL (ref 8.4–10.5)
Sodium: 134 mEq/L — ABNORMAL LOW (ref 135–145)
Total Protein: 6.9 g/dL (ref 6.0–8.3)

## 2012-07-02 LAB — POCT I-STAT TROPONIN I

## 2012-07-02 LAB — PRO B NATRIURETIC PEPTIDE: Pro B Natriuretic peptide (BNP): 146.7 pg/mL — ABNORMAL HIGH (ref 0–125)

## 2012-07-02 MED ORDER — HEPARIN (PORCINE) IN NACL 100-0.45 UNIT/ML-% IJ SOLN
1000.0000 [IU]/h | INTRAMUSCULAR | Status: DC
  Administered 2012-07-03: 1000 [IU]/h via INTRAVENOUS
  Filled 2012-07-02: qty 250

## 2012-07-02 NOTE — ED Notes (Signed)
States that his pain is slightly decreased from the nitroglycerin tabs given by EMS.

## 2012-07-02 NOTE — ED Notes (Signed)
States he was outside smoking a cigarette and started having chest pain, states he took one nitro without relief and called 911.

## 2012-07-02 NOTE — ED Notes (Signed)
States that he has been feeling bad all day, states that his right foot was numb this morning around 330 am, states that his toes feel cold.  States that he started having chest pain this evening around 6 pm.  EMS gave sublingual nitro x2 and 4 baby aspirin en route to hospital.  EMS states CBG was 362.

## 2012-07-02 NOTE — ED Provider Notes (Addendum)
History   Scribed for Donnetta Hutching, MD, the patient was seen in room APA02/APA02 . This chart was scribed by Lewanda Rife.   CSN: [id number]  Arrival date & time 07/02/12  2016   First MD Initiated Contact with Patient 07/02/12 2048      Chief Complaint  Patient presents with  . Chest Pain    (Consider location/radiation/quality/duration/timing/severity/associated sxs/prior treatment) HPI Dustin Cruz is a 46 y.o. male who presents to the Emergency Department complaining of moderate chest pain while smoking a cigarette 30 min prior to arrival. Pt describes the chest pain has tight. Pt reports taking a nitroglycerin without relief because EMS arrived. Pt reports he checked his blood pressure at home of 176/90. Pt reports hx of MI with stent placement, and diabetes.    Past Medical History  Diagnosis Date  . Diabetes mellitus     Type 2  . Hyperlipidemia   . Depression   . Coronary artery disease     s/p BMS to Ramus 10/2009;  Cath 01/22/12 prox 30-40% LAD, LCx ramus w/ hazy 70-80% in-stent restenosis, EF 60% treated medically  . HTN (hypertension)   . Tobacco abuse   . Obesity   . Anxiety   . Melanoma 2007    surgery at [facility]  . Myocardial infarction   . GERD (gastroesophageal reflux disease)   . Hyperlipidemia     Past Surgical History  Procedure Date  . Melanoma surgery 2007    [facility], removed lymph nodes under arm as well  . Cardiac catheterization     with stent  . Esophagogastroduodenoscopy 04/24/2012    Procedure: ESOPHAGOGASTRODUODENOSCOPY (EGD);  Surgeon: Corbin Ade, MD;  Location: [facility];  Service: Endoscopy;  Laterality: N/A;    Family History  Problem Relation Age of Onset  . Lung cancer    . Heart disease    . Other      not real familiar with family history    History  Substance Use Topics  . Smoking status: Current Every Day Smoker -- 0.5 packs/day for 30 years    Types: Cigarettes    Last Attempt to Quit: 03/15/2012  . Smokeless  tobacco: Never Used  . 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   HENT: Negative.   Respiratory: Negative.   Cardiovascular: Positive for chest pain.  Gastrointestinal: Negative.   Musculoskeletal: Negative.   Skin: Negative.   Neurological: Negative.   Hematological: Negative.   Psychiatric/Behavioral: Negative.   All other systems reviewed and are negative.    Allergies  Review of patient's allergies indicates no known allergies.  Home Medications   Current Outpatient Rx  Name Route Sig Dispense Refill  . ASPIRIN 325 MG PO TBEC Oral Take 325 mg by mouth every morning.     [name] GABAPENTIN 400 MG PO CAPS Oral Take 400 mg by mouth 2 (two) times daily.     [name] GEMFIBROZIL 600 MG PO TABS Oral Take 600 mg by mouth 2 (two) times daily before a meal.      . INSULIN GLARGINE 100 UNIT/ML [place] SOLN Subcutaneous Inject 54 Units into the skin at bedtime.     [name] LORATADINE 10 MG PO TABS Oral Take 10 mg by mouth daily.    [name] METFORMIN HCL 1000 MG PO TABS Oral Take 1 tablet (1,000 mg total) by mouth 2 (two) times daily with a meal. HOLD for  48 hours, restart on April 18th.    . METOPROLOL SUCCINATE ER 25 MG PO TB24 Oral Take 1 tablet (25 mg total) by mouth daily. 30 tablet 12  . NAPROXEN 500 MG PO TABS Oral Take 500 mg by mouth as needed. pain    . NITROGLYCERIN 0.4 MG SL SUBL Sublingual Place 1 tablet (0.4 mg total) under the tongue every 5 (five) minutes as needed. For chest pain. 25 tablet 3  . PRASUGREL HCL 10 MG PO TABS Oral Take 1 tablet (10 mg total) by mouth daily. 30 tablet 12  . SIMVASTATIN 40 MG PO TABS Oral Take 40 mg by mouth daily.    . TRAZODONE HCL 50 MG PO TABS Oral Take 50 mg by mouth at bedtime as needed.     [name] UNKNOWN TO PATIENT Oral Take 1 tablet by mouth 2 (two) times daily. For BLOOD SUGAR/Diabetes.      BP 138/70  Pulse 79  Temp 97.7 F (36.5 C) (Oral)  Resp 11  Ht 6\' 2"  (1.88 m)  Wt 214 lb (97.07  kg)  BMI 27.48 kg/m2  SpO2 99%  Physical Exam  Nursing note and vitals reviewed. Constitutional: He is oriented to person, place, and time. He appears well-developed and well-nourished.  HENT:  Head: Normocephalic and atraumatic.  Eyes: Conjunctivae normal and EOM are normal. Pupils are equal, round, and reactive to light.  Neck: Normal range of motion. Neck supple.  Cardiovascular: Normal rate, regular rhythm and normal heart sounds.   Pulmonary/Chest: Effort normal and breath sounds normal.  Abdominal: Soft. Bowel sounds are normal.  Musculoskeletal: Normal range of motion.  Neurological: He is alert and oriented to person, place, and time.  Skin: Skin is warm and dry.  Psychiatric: He has a normal mood and affect.    ED Course  Procedures (including critical care time)  Labs Reviewed  COMPREHENSIVE METABOLIC PANEL - Abnormal; Notable for the following:    Sodium 134 (*)     Glucose, Bld 304 (*)     BUN 25 (*)     Total Bilirubin 0.2 (*)     GFR calc non Af Amer 66 (*)     GFR calc Af Amer 76 (*)     All other components within normal limits  PRO B NATRIURETIC PEPTIDE - Abnormal; Notable for the following:    Pro B Natriuretic peptide (BNP) 146.7 (*)     All other components within normal limits  TROPONIN I  CBC WITH DIFFERENTIAL  PROTIME-INR  APTT  D-DIMER, QUANTITATIVE  POCT I-STAT TROPONIN I   Ct Head Wo Contrast  07/02/2012  *RADIOLOGY REPORT*  Clinical Data: 47 year old male with right lower extremity numbness, chest pain.  CT HEAD WITHOUT CONTRAST  Technique:  Contiguous axial images were obtained from the base of the skull through the vertex without contrast.  Comparison: [facility] Head CT 09/21/2011.  Findings: Visualized paranasal sinuses and mastoids are clear.  No acute osseous abnormality identified.  Visualized orbits and scalp soft tissues are within normal limits.  Stable cerebral volume, within normal limits.  No ventriculomegaly. No  midline shift, mass effect, or evidence of mass lesion.  No evidence of cortically based acute infarction identified.  No acute intracranial hemorrhage identified.  Dominant distal right vertebral artery again noted. No suspicious intracranial vascular hyperdensity.  IMPRESSION: Stable and negative noncontrast CT appearance of the brain.   Original Report Authenticated By: Harley Hallmark, M.D.    Dg Chest Portable 1 View  07/02/2012  *RADIOLOGY REPORT*  Clinical Data: Chest pain and short of breath  PORTABLE CHEST - 1 VIEW  Comparison: 05/05/2012  Findings: Normal heart size.  Clear lungs.  Hyperaeration. Bronchitic changes.  Interstitial prominence.  IMPRESSION: Changes related to COPD.   Original Report Authenticated By: Donavan Burnet, M.D.    .  No diagnosis found.   Date: 07/02/2012  Rate: 85  Rhythm: normal sinus rhythm  QRS Axis: normal  Intervals: normal  ST/T Wave abnormalities: nonspecific T wave changes  Conduction Disutrbances:first-degree A-V block , NS IV block  Narrative Interpretation:   Old EKG Reviewed: changes noted  CRITICAL CARE Performed by: Donnetta Hutching   Total critical care time: 30  Critical care time was exclusive of separately billable procedures and treating other patients.  Critical care was necessary to treat or prevent imminent or life-threatening deterioration.  Critical care was time spent personally by me on the following activities: development of treatment plan with patient and/or surrogate as well as nursing, discussions with consultants, evaluation of patient's response to treatment, examination of patient, obtaining history from patient or surrogate, ordering and performing treatments and interventions, ordering and review of laboratory studies, ordering and review of radiographic studies, pulse oximetry and re-evaluation of patient's condition. MDM  Major Concern is right foot/ toe coolness and numbness.  Discussed with vascular surgeon Dr.  Myra Gianotti.  Also discussed with Dr. Vania Rea.  He will see patient and transfer to [facility] for further evaluation. This was discussed with the patient.  Heparin started      I personally performed the services described in this documentation, which was scribed in my presence. The recorded information has been reviewed and considered.    Donnetta Hutching, MD 07/03/12 0272  Donnetta Hutching, MD 07/03/12 [phone]

## 2012-07-03 ENCOUNTER — Encounter (HOSPITAL_COMMUNITY): Payer: Self-pay | Admitting: Internal Medicine

## 2012-07-03 DIAGNOSIS — F329 Major depressive disorder, single episode, unspecified: Secondary | ICD-10-CM | POA: Insufficient documentation

## 2012-07-03 DIAGNOSIS — E785 Hyperlipidemia, unspecified: Secondary | ICD-10-CM | POA: Insufficient documentation

## 2012-07-03 DIAGNOSIS — E1159 Type 2 diabetes mellitus with other circulatory complications: Secondary | ICD-10-CM | POA: Insufficient documentation

## 2012-07-03 DIAGNOSIS — R200 Anesthesia of skin: Secondary | ICD-10-CM | POA: Diagnosis present

## 2012-07-03 DIAGNOSIS — M79609 Pain in unspecified limb: Secondary | ICD-10-CM

## 2012-07-03 DIAGNOSIS — F419 Anxiety disorder, unspecified: Secondary | ICD-10-CM | POA: Diagnosis present

## 2012-07-03 LAB — HEPARIN LEVEL (UNFRACTIONATED): Heparin Unfractionated: 0.16 IU/mL — ABNORMAL LOW (ref 0.30–0.70)

## 2012-07-03 LAB — COMPREHENSIVE METABOLIC PANEL
ALT: 11 U/L (ref 0–53)
Alkaline Phosphatase: 37 U/L — ABNORMAL LOW (ref 39–117)
BUN: 19 mg/dL (ref 6–23)
CO2: 27 mEq/L (ref 19–32)
GFR calc Af Amer: >90 mL/min (ref >90)
GFR calc non Af Amer: >90 mL/min (ref >90)
Glucose, Bld: 144 mg/dL — ABNORMAL HIGH (ref 70–99)
Potassium: 3.7 mEq/L (ref 3.5–5.1)
Sodium: 139 mEq/L (ref 135–145)
Total Bilirubin: 0.2 mg/dL — ABNORMAL LOW (ref 0.3–1.2)
Total Protein: 6.6 g/dL (ref 6.0–8.3)

## 2012-07-03 LAB — CBC
HCT: 39.9 % (ref 39.0–52.0)
Hemoglobin: 13.6 g/dL (ref 13.0–17.0)
MCHC: 34.1 g/dL (ref 30.0–36.0)
RBC: 4.26 MIL/uL (ref 4.22–5.81)

## 2012-07-03 LAB — GLUCOSE, CAPILLARY: Glucose-Capillary: 239 mg/dL — ABNORMAL HIGH (ref 70–99)

## 2012-07-03 LAB — TROPONIN I: Troponin I: <0.3 ng/mL (ref <0.30)

## 2012-07-03 LAB — HEMOGLOBIN A1C: Hgb A1c MFr Bld: 8.8 % — ABNORMAL HIGH (ref <5.7)

## 2012-07-03 MED ORDER — ACETAMINOPHEN 650 MG RE SUPP: 650.0000 mg | Freq: Four times a day (QID) | RECTAL | Status: DC | PRN

## 2012-07-03 MED ORDER — ASPIRIN EC 325 MG PO TBEC
325.0000 mg | DELAYED_RELEASE_TABLET | Freq: Every morning | ORAL | Status: DC
  Administered 2012-07-03 – 2012-07-05 (×3): 325 mg via ORAL
  Filled 2012-07-03 (×3): qty 1

## 2012-07-03 MED ORDER — INSULIN ASPART 100 UNIT/ML ~~LOC~~ SOLN
0.0000 [IU] | Freq: Three times a day (TID) | SUBCUTANEOUS | Status: DC
  Administered 2012-07-03 – 2012-07-04 (×2): 3 [IU] via SUBCUTANEOUS
  Administered 2012-07-05: 1 [IU] via SUBCUTANEOUS

## 2012-07-03 MED ORDER — POLYETHYLENE GLYCOL 3350 17 G PO PACK
17.0000 g | PACK | Freq: Every day | ORAL | Status: DC | PRN
  Filled 2012-07-03: qty 1

## 2012-07-03 MED ORDER — FLEET ENEMA 7-19 GM/118ML RE ENEM
1.0000 | ENEMA | Freq: Once | RECTAL | Status: AC | PRN
  Filled 2012-07-03: qty 1

## 2012-07-03 MED ORDER — INSULIN ASPART 100 UNIT/ML ~~LOC~~ SOLN
0.0000 [IU] | Freq: Every day | SUBCUTANEOUS | Status: DC
  Administered 2012-07-03: 2 [IU] via SUBCUTANEOUS

## 2012-07-03 MED ORDER — BISACODYL 5 MG PO TBEC: 5.0000 mg | DELAYED_RELEASE_TABLET | Freq: Every day | ORAL | Status: DC | PRN

## 2012-07-03 MED ORDER — NICOTINE 21 MG/24HR TD PT24
21.0000 mg | MEDICATED_PATCH | Freq: Every day | TRANSDERMAL | Status: DC
  Administered 2012-07-03 – 2012-07-04 (×2): 21 mg via TRANSDERMAL
  Filled 2012-07-03 (×3): qty 1

## 2012-07-03 MED ORDER — GEMFIBROZIL 600 MG PO TABS
600.0000 mg | ORAL_TABLET | Freq: Two times a day (BID) | ORAL | Status: DC
  Administered 2012-07-03 – 2012-07-05 (×5): 600 mg via ORAL
  Filled 2012-07-03 (×7): qty 1

## 2012-07-03 MED ORDER — GABAPENTIN 400 MG PO CAPS
400.0000 mg | ORAL_CAPSULE | Freq: Two times a day (BID) | ORAL | Status: DC
Start: 2012-07-03 — End: 2012-07-05
  Administered 2012-07-03 – 2012-07-05 (×5): 400 mg via ORAL
  Filled 2012-07-03 (×6): qty 1

## 2012-07-03 MED ORDER — PRASUGREL HCL 10 MG PO TABS
10.0000 mg | ORAL_TABLET | Freq: Every day | ORAL | Status: DC
  Administered 2012-07-03 – 2012-07-05 (×3): 10 mg via ORAL
  Filled 2012-07-03 (×3): qty 1

## 2012-07-03 MED ORDER — NITROGLYCERIN 0.4 MG SL SUBL: 0.4000 mg | SUBLINGUAL_TABLET | SUBLINGUAL | Status: DC | PRN

## 2012-07-03 MED ORDER — HEPARIN BOLUS VIA INFUSION
3000.0000 [IU] | Freq: Once | INTRAVENOUS | Status: AC
  Administered 2012-07-03: 3000 [IU] via INTRAVENOUS
  Filled 2012-07-03: qty 3000

## 2012-07-03 MED ORDER — OXYCODONE HCL 5 MG PO TABS
5.0000 mg | ORAL_TABLET | ORAL | Status: DC | PRN
  Administered 2012-07-03 (×2): 5 mg via ORAL
  Filled 2012-07-03 (×2): qty 1

## 2012-07-03 MED ORDER — SODIUM CHLORIDE 0.9 % IJ SOLN
3.0000 mL | Freq: Two times a day (BID) | INTRAMUSCULAR | Status: DC
  Administered 2012-07-04 (×2): 3 mL via INTRAVENOUS

## 2012-07-03 MED ORDER — TRAZODONE HCL 50 MG PO TABS
50.0000 mg | ORAL_TABLET | Freq: Every evening | ORAL | Status: DC | PRN
  Administered 2012-07-04: 50 mg via ORAL
  Filled 2012-07-03 (×4): qty 1

## 2012-07-03 MED ORDER — ONDANSETRON HCL 4 MG/2ML IJ SOLN: 4.0000 mg | Freq: Four times a day (QID) | INTRAMUSCULAR | Status: DC | PRN

## 2012-07-03 MED ORDER — SIMVASTATIN 40 MG PO TABS
40.0000 mg | ORAL_TABLET | Freq: Every day | ORAL | Status: DC
  Filled 2012-07-03: qty 1

## 2012-07-03 MED ORDER — ACETAMINOPHEN 325 MG PO TABS: 650.0000 mg | ORAL_TABLET | ORAL | Status: DC | PRN

## 2012-07-03 MED ORDER — LORATADINE 10 MG PO TABS
10.0000 mg | ORAL_TABLET | Freq: Every day | ORAL | Status: DC
  Administered 2012-07-03 – 2012-07-05 (×3): 10 mg via ORAL
  Filled 2012-07-03 (×3): qty 1

## 2012-07-03 MED ORDER — INSULIN GLARGINE 100 UNIT/ML ~~LOC~~ SOLN
54.0000 [IU] | Freq: Every day | SUBCUTANEOUS | Status: DC
  Administered 2012-07-03 – 2012-07-04 (×2): 54 [IU] via SUBCUTANEOUS

## 2012-07-03 MED ORDER — HYDROMORPHONE HCL PF 1 MG/ML IJ SOLN: 0.5000 mg | INTRAMUSCULAR | Status: DC | PRN

## 2012-07-03 MED ORDER — ONDANSETRON HCL 4 MG PO TABS: 4.0000 mg | ORAL_TABLET | Freq: Four times a day (QID) | ORAL | Status: DC | PRN

## 2012-07-03 MED ORDER — METOPROLOL SUCCINATE ER 25 MG PO TB24
25.0000 mg | ORAL_TABLET | Freq: Every day | ORAL | Status: DC
  Administered 2012-07-03 – 2012-07-05 (×3): 25 mg via ORAL
  Filled 2012-07-03 (×3): qty 1

## 2012-07-03 MED ORDER — HEPARIN (PORCINE) IN NACL 100-0.45 UNIT/ML-% IJ SOLN
1800.0000 [IU]/h | INTRAMUSCULAR | Status: DC
  Administered 2012-07-03 (×2): 1400 [IU]/h via INTRAVENOUS
  Filled 2012-07-03 (×3): qty 250

## 2012-07-03 MED ORDER — ATORVASTATIN CALCIUM 20 MG PO TABS
20.0000 mg | ORAL_TABLET | Freq: Every day | ORAL | Status: DC
  Administered 2012-07-03 – 2012-07-04 (×2): 20 mg via ORAL
  Filled 2012-07-03 (×3): qty 1

## 2012-07-03 NOTE — ED Notes (Signed)
Patient sitting quietly, watching tv, voices no complaints.  States that his chest only hurts when he coughs.

## 2012-07-03 NOTE — Care Management Note (Unsigned)
    Page 1 of 1   07/03/2012     9:23:33 AM   CARE MANAGEMENT NOTE 07/03/2012  Patient:  Dustin, Cruz   Account Number:  [id number]  Date Initiated:  07/03/2012  Documentation initiated by:  SIMMONS,Sargent Mankey  Subjective/Objective Assessment:   ADMITTED WITH LE NUMBNESS; LIVES AT HOME WITH WIFE- RAE; WAS IPTA- NO DME.     Action/Plan:   DISCHARGE PLANNING DISCUSSED AT BEDSIDE.   Anticipated DC Date:  07/04/2012   Anticipated DC Plan:  HOME/SELF CARE      DC Planning Services  CM consult      Choice offered to / List presented to:             Status of service:  In process, will continue to follow Medicare Important Message given?   (If response is "NO", the following Medicare IM given date fields will be blank) Date Medicare IM given:   Date Additional Medicare IM given:    Discharge Disposition:    Per UR Regulation:  Reviewed for med. necessity/level of care/duration of stay  If discussed at Long Length of Stay Meetings, dates discussed:    Comments:  07/03/12  0922  Deangleo Passage SIMMONS RN, BSN [phone] NCM WILL FOLLOW.

## 2012-07-03 NOTE — Consult Note (Signed)
[facility]      Consult Note  Patient name: Dustin Cruz MRN: [id number] DOB: 03-04-1966 Sex: male  Consulting Physician:  Hospitalist service  Reason for Consult:  Chief Complaint  Patient presents with  . Chest Pain    HISTORY OF PRESENT ILLNESS: This is a 46 year old white male who was transferred from [facility] with concerns over circulation to his right leg. The patient states that beginning yesterday he had 2 episodes of his right leg going numb.  Initially was Monday night into Tuesday morning. It lasted for approximately 45 minutes and then resolved. He had another episode which prompted him to go to the hospital. While there a posterior tibial Doppler signal was able to be heard. He had normal motor function. He underwent a workup for stroke which was negative. There was question over a vascular issue and therefore he was transferred to [facility]. The patient did try sublingual nitroglycerin without benefit. I asked him to be placed on heparin and transferred.  The patient suffers from diabetes. His blood sugar was in the 300s at the referring institution. He is also medically managed for hypertension and hyperlipidemia. He is a current smoker. He suffered a heart attack in 2012 and was treated with a bare-metal stent.  Past Medical History  Diagnosis Date  . Diabetes mellitus     Type 2  . Hyperlipidemia   . Depression   . Coronary artery disease     s/p BMS to Ramus 10/2010;  Cath 01/22/12 prox 30-40% LAD, LCx ramus w/ hazy 70-80% in-stent restenosis, EF 60% treated medically  . HTN (hypertension)   . Tobacco abuse   . Obesity   . Anxiety   . Melanoma 2007    surgery at [facility]  . Myocardial infarction 2012  . GERD (gastroesophageal reflux disease)   . Hyperlipidemia     Past Surgical History  Procedure Date  . Melanoma surgery 2007    [facility], removed lymph nodes under arm as well  . Cardiac catheterization     with stent  .  Esophagogastroduodenoscopy 04/24/2012    Procedure: ESOPHAGOGASTRODUODENOSCOPY (EGD);  Surgeon: Corbin Ade, MD;  Location: [facility];  Service: Endoscopy;  Laterality: N/A;    History   Social History  . Marital Status: Married    Spouse Name: rayann Quiros    Number of Children: 0  . Years of Education: N/A   Occupational History  . disabled    Social History Main Topics  . Smoking status: Current Every Day Smoker -- 1.0 packs/day for 30 years    Types: Cigarettes  . Smokeless tobacco: Never Used  . 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 Drug Use: No  . Sexually Active: Yes   Other Topics Concern  . Not on file   Social History Narrative  . No narrative on file    Family History  Problem Relation Age of Onset  . Lung cancer    . Heart disease    . Other      not real familiar with family history    Allergies as of 07/02/2012  . (No Known Allergies)    No current facility-administered medications on file prior to encounter.   Current Outpatient Prescriptions on File Prior to Encounter  Medication Sig Dispense Refill  . aspirin 325 MG EC tablet Take 325 mg by mouth every morning.       . gabapentin (  NEURONTIN) 400 MG capsule Take 400 mg by mouth 2 (two) times daily.       [name] gemfibrozil (LOPID) 600 MG tablet Take 600 mg by mouth 2 (two) times daily before a meal.        . insulin glargine (LANTUS SOLOSTAR) 100 UNIT/ML injection Inject 54 Units into the skin at bedtime.       [name] loratadine (CLARITIN) 10 MG tablet Take 10 mg by mouth daily.      . metFORMIN (GLUCOPHAGE) 1000 MG tablet Take 1 tablet (1,000 mg total) by mouth 2 (two) times daily with a meal. HOLD for 48 hours, restart on April 18th.      . metoprolol succinate (TOPROL-XL) 25 MG 24 hr tablet Take 1 tablet (25 mg total) by mouth daily.  30 tablet  12  . naproxen (NAPROSYN) 500 MG tablet Take 500 mg by mouth as needed. pain      . nitroGLYCERIN (NITROSTAT) 0.4 MG SL tablet Place  1 tablet (0.4 mg total) under the tongue every 5 (five) minutes as needed. For chest pain.  25 tablet  3  . prasugrel (EFFIENT) 10 MG TABS Take 1 tablet (10 mg total) by mouth daily.  30 tablet  12  . simvastatin (ZOCOR) 40 MG tablet Take 40 mg by mouth daily.      . traZODone (DESYREL) 50 MG tablet Take 50 mg by mouth at bedtime as needed.       [name] DISCONTD: glipiZIDE (GLUCOTROL) 10 MG tablet Take 10 mg by mouth 2 (two) times daily before a meal.        . DISCONTD: lisinopril (PRINIVIL,ZESTRIL) 5 MG tablet Take 5 mg by mouth daily.        [name] DISCONTD: metoprolol tartrate (LOPRESSOR) 25 MG tablet Take 25 mg by mouth 2 (two) times daily.           REVIEW OF SYSTEMS: Cardiovascular: No chest pain, chest pressure, palpitations, orthopnea, or dyspnea on exertion. No claudication or rest pain,  No history of DVT or phlebitis. Pulmonary: No productive cough, asthma or wheezing. Neurologic: Please see history of present illness Hematologic: No bleeding problems or clotting disorders. Musculoskeletal: No joint pain or joint swelling. Gastrointestinal: No blood in stool or hematemesis Genitourinary: No dysuria or hematuria. Psychiatric:: No history of major depression. Integumentary: No rashes or ulcers. Constitutional: No fever or chills.  PHYSICAL EXAMINATION: General: The patient appears their stated age.  Vital signs are BP 146/91  Pulse 72  Temp 97.3 F (36.3 C) (Oral)  Resp 16  Ht 6\' 2"  (1.88 m)  Wt 224 lb 3.3 oz (101.7 kg)  BMI 28.79 kg/m2  SpO2 98% Pulmonary: Respirations are non-labored HEENT:  No gross abnormalities Abdomen: Soft and non-tender  Musculoskeletal: There are no major deformities.   Neurologic: No focal weakness  detected, the patient has diminished sensation on both lower extremities up to the mid calf Skin: There are no ulcer or rashes noted. Psychiatric: The patient has normal affect. Cardiovascular: There is a regular rate and rhythm without significant murmur  appreciated. Multiphasic Doppler signal within the posterior tibial and dorsalis pedis arteries bilaterally  Diagnostic Studies: None    Assessment:  Right leg numbness Plan: Based on my exam today which was performed when I was notified the patient was here, I do not think his numbness is related to arterial insufficiency. He has brisk Doppler signals in the right foot and are symmetric when compared to the left. In addition, he has normal  motor function of the foot. He has diminished sensation bilaterally which I suspect is related to neuropathy from his diabetes. I will go ahead and get a formal Doppler studies of his leg including ankle-brachial indices today. In addition I will get carotid Dopplers to make sure this is not related to a TIA-type event.      Jorge Ny, M.D. [facility] [place] Office: [phone] Pager:  [phone]

## 2012-07-03 NOTE — Progress Notes (Signed)
[facility] PROGRESS NOTE  Dustin Cruz:[id number] DOB: 19-Oct-1965 DOA: 07/02/2012 PCP: Dustin D., PA  Assessment/Plan: Principal Problem:  *Numbness of foot Active Problems:  CAD (coronary artery disease), native coronary artery  Hypercholesterolemia  Insulin dependent type 2 diabetes mellitus, controlled  Tobacco abuse  GERD (gastroesophageal reflux disease)  Obesity  Anxiety  1. Right leg numbness : his ABI and dopllers show adequate perfusion. He probably has diabetic neuropathy. Carotid duplex pending.  2.  Diabetes Mellitus: hgba1c is 8.8. CBG (last 3)   Basename 07/03/12 1648 07/03/12 1134 07/03/12 0747  GLUCAP 109* 219* 137*    cbg's are better controlled.   3. CAD: pt is ches tpain free. And enzymes have been negative.   continue with the rest of medicaitons.  DVT PROHPYLAXIS; on heparin.   Brief narrative:  Dustin Cruz is an 46 y.o. male. Obese Caucasian gentleman multiple medical problems including, diabetes, hyperlipidemia, hypertension, tobacco abuse, obesity, and STEMI in January 2012 treated with BMS, presents with almost 3 hours of numbness, tingling and coldness while at home resting. She did try to self medicate by taking sublingual nitroglycerin without benefit, and eventually came to the emergency room arriving after 8 PM, for evaluation. Patient had negative CT exam and negative blood work, but it was felt that his assessment of his right foot is cold was correct; his dorsalis pedis pulses could not be found by Doppler; the situation was discussed with the on-call vascular surgeon Dr. Myra Gianotti, who recommended admission to the hospitalist service at [facility] consultation by the vascular surgeon for further evaluation.      Consultants:  Vascular surgery consult.   Procedures:  ABI/ DOPPLERS.  Antibiotics:  none  HPI/Subjective: Burning sensation has resolved  Objective: Filed Vitals:   07/03/12 0200 07/03/12 0345 07/03/12  0354 07/03/12 1329  BP: 131/71 146/91  141/93  Pulse: 72 72  84  Temp:  97.3 F (36.3 C)    TempSrc:  Oral    Resp: 11 16  18   Height:      Weight:   101.7 kg (224 lb 3.3 oz)   SpO2: 98% 98%  100%    Intake/Output Summary (Last 24 hours) at 07/03/12 1901 Last data filed at 07/03/12 1300  Gross per 24 hour  Intake    720 ml  Output      0 ml  Net    720 ml   Filed Weights   07/02/12 2019 07/03/12 0354  Weight: 97.07 kg (214 lb) 101.7 kg (224 lb 3.3 oz)    Exam:   General:  Alert afebrile comfortable  Cardiovascular: s1s2  Respiratory: CTAB  Abdomen: SOFTNT ND BS+  Extremities: no pedal edema, cyanosis or clubbing. Pulses present bilateral.  Data Reviewed: Basic Metabolic Panel:  Lab 07/03/12 9562 07/02/12 2027  NA 139 134*  K 3.7 4.4  CL 104 99  CO2 27 25  GLUCOSE 144* 304*  BUN 19 25*  CREATININE 0.98 1.27  CALCIUM 9.2 9.6  MG -- --  PHOS -- --   Liver Function Tests:  Lab 07/03/12 0355 07/02/12 2027  AST 11 11  ALT 11 12  ALKPHOS 37* 46  BILITOT 0.2* 0.2*  PROT 6.6 6.9  ALBUMIN 3.8 4.0   No results found for this basename: LIPASE:5,AMYLASE:5 in the last 168 hours No results found for this basename: AMMONIA:5 in the last 168 hours CBC:  Lab 07/03/12 0355 07/02/12 2027  WBC 6.9 5.8  NEUTROABS -- 3.2  HGB  13.6 13.9  HCT 39.9 40.5  MCV 93.7 93.5  PLT 339 368   Cardiac Enzymes:  Lab 07/03/12 1627 07/03/12 0945 07/03/12 0355 07/02/12 2027  CKTOTAL -- -- -- --  CKMB -- -- -- --  CKMBINDEX -- -- -- --  TROPONINI <0.30 <0.30 <0.30 <0.30   BNP (last 3 results)  [facility] 07/02/12 2028 01/22/12 0041  PROBNP 146.7* 101.3   CBG:  Lab 07/03/12 1648 07/03/12 1134 07/03/12 0747  GLUCAP 109* 219* 137*    No results found for this or any previous visit (from the past 240 hour(s)).   Studies: Ct Head Wo Contrast  07/02/2012  *RADIOLOGY REPORT*  Clinical Data: 46 year old male with right lower extremity numbness, chest pain.  CT HEAD  WITHOUT CONTRAST  Technique:  Contiguous axial images were obtained from the base of the skull through the vertex without contrast.  Comparison: [facility] Head CT 09/21/2011.  Findings: Visualized paranasal sinuses and mastoids are clear.  No acute osseous abnormality identified.  Visualized orbits and scalp soft tissues are within normal limits.  Stable cerebral volume, within normal limits.  No ventriculomegaly. No midline shift, mass effect, or evidence of mass lesion.  No evidence of cortically based acute infarction identified.  No acute intracranial hemorrhage identified.  Dominant distal right vertebral artery again noted. No suspicious intracranial vascular hyperdensity.  IMPRESSION: Stable and negative noncontrast CT appearance of the brain.   Original Report Authenticated By: Harley Hallmark, M.D.    Dg Chest Portable 1 View  07/02/2012  *RADIOLOGY REPORT*  Clinical Data: Chest pain and short of breath  PORTABLE CHEST - 1 VIEW  Comparison: 05/05/2012  Findings: Normal heart size.  Clear lungs.  Hyperaeration. Bronchitic changes.  Interstitial prominence.  IMPRESSION: Changes related to COPD.   Original Report Authenticated By: Donavan Burnet, M.D.     Scheduled Meds:   . aspirin  325 mg Oral q morning - 10a  . atorvastatin  20 mg Oral q1800  . gabapentin  400 mg Oral BID  . gemfibrozil  600 mg Oral BID AC  . heparin  3,000 Units Intravenous Once  . insulin aspart  0-5 Units Subcutaneous QHS  . insulin aspart  0-9 Units Subcutaneous TID WC  . insulin glargine  54 Units Subcutaneous QHS  . loratadine  10 mg Oral Daily  . metoprolol succinate  25 mg Oral Daily  . nicotine  21 mg Transdermal Daily  . prasugrel  10 mg Oral Daily  . sodium chloride  3 mL Intravenous Q12H  . DISCONTD: simvastatin  40 mg Oral q1800   Continuous Infusions:   . heparin 1,400 Units/hr (07/03/12 1546)  . DISCONTD: heparin 1,000 Units/hr (07/03/12 0007)    Principal Problem:  *Numbness of  foot Active Problems:  CAD (coronary artery disease), native coronary artery  Hypercholesterolemia  Insulin dependent type 2 diabetes mellitus, controlled  Tobacco abuse  GERD (gastroesophageal reflux disease)  Obesity  Anxiety       Skyley Grandmaison  [facility] Pager [phone]. If 8PM-8AM, please contact night-coverage at [email or web address], password [email or web address] 07/03/2012, 7:01 PM  LOS: 1 day

## 2012-07-03 NOTE — Progress Notes (Signed)
ANTICOAGULATION CONSULT NOTE - Follow Up Consult  Pharmacy Consult for Heparin Indication: rule-out peripheral vascular ischemia, rule-out ACS  No Known Allergies  Patient Measurements: Height: 6\' 2"  (188 cm) Weight: 224 lb 3.3 oz (101.7 kg) IBW/kg (Calculated) : 82.2  Heparin Dosing Weight: 101.7 kg  Vital Signs: Temp: 97.3 F (36.3 C) (09/25 0345) Temp src: Oral (09/25 0345) BP: 146/91 mmHg (09/25 0345) Pulse Rate: 72  (09/25 0345)  Labs:  Basename 07/03/12 1105 07/03/12 0945 07/03/12 0355 07/02/12 2028 07/02/12 2027  HGB -- -- 13.6 -- 13.9  HCT -- -- 39.9 -- 40.5  PLT -- -- 339 -- 368  APTT -- -- -- 30 --  LABPROT -- -- -- 11.9 --  INR -- -- -- 0.88 --  HEPARINUNFRC 0.16* -- -- -- --  CREATININE -- -- 0.98 -- 1.27  CKTOTAL -- -- -- -- --  CKMB -- -- -- -- --  TROPONINI -- <0.30 <0.30 -- <0.30    Estimated Creatinine Clearance: 118.6 ml/min (by C-G formula based on Cr of 0.98).   Medications:  Infusions:    . heparin 1,000 Units/hr (07/03/12 0007)    Assessment: Rule-out peripheral ischemia, rule-out ACS:  Heparin was started overnight at [facility] at a rate of ~10 units/kg/hr.  Initial Heparin level is subtherapeutic.  Goal of Therapy:  Heparin level 0.3-0.7 units/ml Monitor platelets by anticoagulation protocol: Yes   Plan:  Heparin 3000 units IV bolus x 1 Increase Heparin drip to 1400 units/hr Recheck Heparin level in 6 hours  Estella Husk, Pharm.D., BCPS Clinical Pharmacist  Phone [phone] Pager [phone] 07/03/2012, 1:01 PM

## 2012-07-03 NOTE — Progress Notes (Signed)
VASCULAR LAB PRELIMINARY  ARTERIAL  ABI completed:    RIGHT    LEFT    PRESSURE WAVEFORM  PRESSURE WAVEFORM  BRACHIAL 142 Triphasic BRACHIAL 141 Triphasic         AT 155 Triphasic AT 156 Triphasic  PT 184 Triphasic PT 179 Triphasic         GREAT TOE 82  GREAT TOE 86     RIGHT LEFT  ABI 1.3 1.26   ABIs and Doppler waveforms indicate normal arterial flow bilaterally at rest. Great toe pressures suggest adequate perfusion bilaterally.  Ellawyn Wogan, RVS 07/03/2012, 12:23 PM .

## 2012-07-03 NOTE — H&P (Signed)
[facility] History and Physical  Dustin Cruz  ZOX:[id number]  DOB: 12-28-1965   DOA: 07/03/2012   PCP:   Dustin Furnish D., PA   Chief Complaint:  Numbness of the right foot and leg are from after 5 PM yes terday  HPI: Dustin Cruz is an 46 y.o. male.   Obese Caucasian gentleman multiple medical problems including, diabetes, hyperlipidemia, hypertension, tobacco abuse, obesity, and STEMI in January 2012 treated with BMS, presents with almost 3 hours of numbness, tingling and coldness while at home resting. She did try to self medicate by taking sublingual nitroglycerin without benefit, and eventually came to the emergency room arriving after 8 PM, for evaluation.  Patient had negative CT exam and negative blood work, but it was felt that his assessment of his right foot is cold was correct; his dorsalis pedis pulses could not be found by Doppler; the situation was discussed with the on-call vascular surgeon Dr. Myra Gianotti,  who recommended admission to the hospitalist service at [facility] consultation by the vascular surgeon for further evaluation.    The patient is now complaining of episodic numbness and tingling of the right foot. Denies visual disturbance, speech disturbance, dysphagia, or focal weakness. He denies fever or chills; he has been coughing since coming to the emergency room because room is a little chilly   Denies nausea vomiting or abdominal pain. Admits to ongoing tobacco use He has been having episodic chest pains, which are not new and not outstanding, unaffected by nitroglycerin.  Rewiew of Systems:   All systems negative except as marked bold or noted in the HPI;  Constitutional: Negative for malaise, fever and chills. ;  Eyes: Negative for eye pain, redness and discharge. ;  ENMT: Negative for ear pain, hoarseness, nasal congestion, sinus pressure and sore throat. ;  Cardiovascular: Negative for palpitations, diaphoresis, dyspnea and peripheral edema. ;    Respiratory: Negative for cough, hemoptysis, wheezing and stridor. ;  Gastrointestinal: Negative for nausea, vomiting, diarrhea, constipation, abdominal pain, melena, blood in stool, hematemesis, jaundice and rectal bleeding. unusual weight loss..   Genitourinary: Negative for frequency, dysuria, incontinence,flank pain and hematuria; Musculoskeletal: Negative for back pain and neck pain. Negative for swelling and trauma.;  Skin: . Negative for pruritus, rash, abrasions, bruising and skin lesion.; ulcerations Neuro: Negative for headache, lightheadedness and neck stiffness. Negative for weakness, altered level of consciousness , altered mental status, extremity weakness,  involuntary movement, seizure and syncope.  Psych: negative for, insomnia, tearfulness, panic attacks, hallucinations, paranoia, suicidal or homicidal ideation    Past Medical History  Diagnosis Date  . Diabetes mellitus     Type 2  . Hyperlipidemia   . Depression   . Coronary artery disease     s/p BMS to Ramus 10/2010;  Cath 01/22/12 prox 30-40% LAD, LCx ramus w/ hazy 70-80% in-stent restenosis, EF 60% treated medically  . HTN (hypertension)   . Tobacco abuse   . Obesity   . Anxiety   . Melanoma 2007    surgery at [facility]  . Myocardial infarction 2012  . GERD (gastroesophageal reflux disease)   . Hyperlipidemia     Past Surgical History  Procedure Date  . Melanoma surgery 2007    [facility], removed lymph nodes under arm as well  . Cardiac catheterization     with stent  . Esophagogastroduodenoscopy 04/24/2012    Procedure: ESOPHAGOGASTRODUODENOSCOPY (EGD);  Surgeon: Corbin Ade, MD;  Location: [facility];  Service: Endoscopy;  Laterality: N/A;  Medications:  HOME MEDS: Prior to Admission medications   Medication Sig Start Date End Date Taking? Authorizing Provider  aspirin 325 MG EC tablet Take 325 mg by mouth every morning.    Yes Historical Provider, MD  gabapentin (NEURONTIN) 400 MG capsule Take  400 mg by mouth 2 (two) times daily.    Yes Historical Provider, MD  gemfibrozil (LOPID) 600 MG tablet Take 600 mg by mouth 2 (two) times daily before a meal.     Yes Historical Provider, MD  insulin glargine (LANTUS SOLOSTAR) 100 UNIT/ML injection Inject 54 Units into the skin at bedtime.    Yes Historical Provider, MD  loratadine (CLARITIN) 10 MG tablet Take 10 mg by mouth daily.   Yes Historical Provider, MD  metFORMIN (GLUCOPHAGE) 1000 MG tablet Take 1 tablet (1,000 mg total) by mouth 2 (two) times daily with a meal. HOLD for 48 hours, restart on April 18th. 01/23/12  Yes Rhonda G Barrett, PA  metoprolol succinate (TOPROL-XL) 25 MG 24 hr tablet Take 1 tablet (25 mg total) by mouth daily. 12/27/11  Yes Jodelle Gross, NP  naproxen (NAPROSYN) 500 MG tablet Take 500 mg by mouth as needed. pain   Yes Historical Provider, MD  nitroGLYCERIN (NITROSTAT) 0.4 MG SL tablet Place 1 tablet (0.4 mg total) under the tongue every 5 (five) minutes as needed. For chest pain. 01/23/12  Yes Rhonda G Barrett, PA  prasugrel (EFFIENT) 10 MG TABS Take 1 tablet (10 mg total) by mouth daily. 12/27/11  Yes Jodelle Gross, NP  simvastatin (ZOCOR) 40 MG tablet Take 40 mg by mouth daily. 05/20/12 05/20/13 Yes Jodelle Gross, NP  traZODone (DESYREL) 50 MG tablet Take 50 mg by mouth at bedtime as needed.    Yes Historical Provider, MD  UNKNOWN TO PATIENT Take 1 tablet by mouth 2 (two) times daily. For BLOOD SUGAR/Diabetes.   Yes Historical Provider, MD     Allergies:  No Known Allergies  Social History:   reports that he has been smoking Cigarettes.  He has a 30 [place]-year smoking history. He has never used smokeless tobacco. He reports that he does not drink alcohol or use illicit drugs.  Family History: Family History  Problem Relation Age of Onset  . Lung cancer    . Heart disease    . Other      not real familiar with family history     Physical Exam: Filed Vitals:   07/03/12 0145 07/03/12 0200  07/03/12 0345 07/03/12 0354  BP: 126/70 131/71 146/91   Pulse: 75 72 72   Temp:   97.3 F (36.3 C)   TempSrc:   Oral   Resp: 9 11 16    Height:      Weight:    101.7 kg (224 lb 3.3 oz)  SpO2: 99% 98% 98%    Blood pressure 146/91, pulse 72, temperature 97.3 F (36.3 C), temperature source Oral, resp. rate 16, height 6\' 2"  (1.88 m), weight 101.7 kg (224 lb 3.3 oz), SpO2 98.00%.  GEN:  Pleasant obese Caucasian gentleman lying in the stretcher in no acute distress; cooperative with exam PSYCH:  alert and oriented x4; mildly anxious but affect is appropriate. HEENT: Mucous membranes pink and anicteric; PERRLA; EOM intact; no cervical lymphadenopathy nor thyromegaly or carotid bruit; no JVD; Breasts:: Not examined CHEST WALL: No tenderness CHEST: Normal respiration, occasional rhonchi bilaterally at bases HEART: Distant heart sounds, difficulty hearing even at the epigastrium; no murmurs rubs or gallops BACK: No kyphosis  or scoliosis; no CVA tenderness ABDOMEN: Obese, markedly distended by fat, firm non-tender; no masses, no organomegaly, decrease l abdominal bowel sounds; moderate pannus; no intertriginous candida. Both feet are equally cold, toes are pink, capillary refill is less than 3 seconds bilaterally, dorsalis pedis pulses was not felt a posterior tibial pulses were  2+ and equal Rectal Exam: Not done EXTREMITIES: No bone or joint deformity; a no edema; no ulcerations. Genitalia: not examined PULSES: 2+ and symmetric SKIN: Normal hydration no rash or ulceration CNS: Cranial nerves 2-12 grossly intact no focal lateralizing neurologic deficit   Labs on Admission:  Basic Metabolic Panel:  Lab 07/03/12 9528 07/02/12 2027  NA 139 134*  K 3.7 4.4  CL 104 99  CO2 27 25  GLUCOSE 144* 304*  BUN 19 25*  CREATININE 0.98 1.27  CALCIUM 9.2 9.6  MG -- --  PHOS -- --   Liver Function Tests:  Lab 07/03/12 0355 07/02/12 2027  AST 11 11  ALT 11 12  ALKPHOS 37* 46  BILITOT 0.2* 0.2*   PROT 6.6 6.9  ALBUMIN 3.8 4.0   No results found for this basename: LIPASE:5,AMYLASE:5 in the last 168 hours No results found for this basename: AMMONIA:5 in the last 168 hours CBC:  Lab 07/03/12 0355 07/02/12 2027  WBC 6.9 5.8  NEUTROABS -- 3.2  HGB 13.6 13.9  HCT 39.9 40.5  MCV 93.7 93.5  PLT 339 368   Cardiac Enzymes:  Lab 07/03/12 0355 07/02/12 2027  CKTOTAL -- --  CKMB -- --  CKMBINDEX -- --  TROPONINI <0.30 <0.30   BNP: No components found with this basename: POCBNP:5 D-dimer: No components found with this basename: D-DIMER:5 CBG: No results found for this basename: GLUCAP:5 in the last 168 hours  Radiological Exams on Admission: Ct Head Wo Contrast  07/02/2012  *RADIOLOGY REPORT*  Clinical Data: 46 year old male with right lower extremity numbness, chest pain.  CT HEAD WITHOUT CONTRAST  Technique:  Contiguous axial images were obtained from the base of the skull through the vertex without contrast.  Comparison: [facility] Head CT 09/21/2011.  Findings: Visualized paranasal sinuses and mastoids are clear.  No acute osseous abnormality identified.  Visualized orbits and scalp soft tissues are within normal limits.  Stable cerebral volume, within normal limits.  No ventriculomegaly. No midline shift, mass effect, or evidence of mass lesion.  No evidence of cortically based acute infarction identified.  No acute intracranial hemorrhage identified.  Dominant distal right vertebral artery again noted. No suspicious intracranial vascular hyperdensity.  IMPRESSION: Stable and negative noncontrast CT appearance of the brain.   Original Report Authenticated By: Harley Hallmark, M.D.    Dg Chest Portable 1 View  07/02/2012  *RADIOLOGY REPORT*  Clinical Data: Chest pain and short of breath  PORTABLE CHEST - 1 VIEW  Comparison: 05/05/2012  Findings: Normal heart size.  Clear lungs.  Hyperaeration. Bronchitic changes.  Interstitial prominence.  IMPRESSION: Changes related  to COPD.   Original Report Authenticated By: Donavan Burnet, M.D.     EKG: Independently reviewed. Sinus rhythm with first degree AV block; nonspecific intraventricular conduction delay; ; T wave inversion in inferior leads.   Assessment/Plan Present on Admission:  .Numbness of foot, transient episodic  .CAD (coronary artery disease), native coronary artery .Hypercholesterolemia .Obesity .Anxiety .GERD (gastroesophageal reflux disease) .Tobacco abuse .Insulin dependent type 2 diabetes mellitus, controlled   PLAN: We'll admit this gentleman to a telemetry bed and transfer her to [facility] for evaluation by  a vascular surgeon; We'll cycle his cardiac enzymes because of his strong cardiac history and is episodic chest pains; Once again counseled against tobacco abuse, and prescribe nicotine patch. Continue management of his chronic medical conditions  Other plans as per orders.  Code Status: FULL CODE  Family Communication: Plans discussed with patient Disposition Plan: Per hospitalist service at [facility]   Milany Geck Nocturnist [facility] Pager [phone]   07/03/2012, 5:16 AM

## 2012-07-03 NOTE — ED Notes (Signed)
Patient transfer consent signed.

## 2012-07-03 NOTE — Progress Notes (Signed)
ANTICOAGULATION CONSULT NOTE - Follow Up Consult  Pharmacy Consult for Heparin Indication: rule-out peripheral vascular ischemia, rule-out ACS  No Known Allergies  Patient Measurements: Height: 6\' 2"  (188 cm) Weight: 224 lb 3.3 oz (101.7 kg) IBW/kg (Calculated) : 82.2  Heparin Dosing Weight: 101.7 kg  Vital Signs: Temp: 97.3 F (36.3 C) (09/25 2039) Temp src: Oral (09/25 2039) BP: 140/86 mmHg (09/25 2039) Pulse Rate: 74  (09/25 2039)  Labs:  Basename 07/03/12 1957 07/03/12 1627 07/03/12 1105 07/03/12 0945 07/03/12 0355 07/02/12 2028 07/02/12 2027  HGB -- -- -- -- 13.6 -- 13.9  HCT -- -- -- -- 39.9 -- 40.5  PLT -- -- -- -- 339 -- 368  APTT -- -- -- -- -- 30 --  LABPROT -- -- -- -- -- 11.9 --  INR -- -- -- -- -- 0.88 --  HEPARINUNFRC 0.27* -- 0.16* -- -- -- --  CREATININE -- -- -- -- 0.98 -- 1.27  CKTOTAL -- -- -- -- -- -- --  CKMB -- -- -- -- -- -- --  TROPONINI -- <0.30 -- <0.30 <0.30 -- --    Estimated Creatinine Clearance: 118.6 ml/min (by C-G formula based on Cr of 0.98).   Medications:  Infusions:     . heparin 1,400 Units/hr (07/03/12 1546)  . DISCONTD: heparin 1,000 Units/hr (07/03/12 0007)    Assessment: Rule-out peripheral ischemia, rule-out ACS:  Heparin was started overnight at [facility] at a rate of ~10 units/kg/hr. Heparin level increased to 0.27 with rate increase, but this is still below the goal.  Goal of Therapy:  Heparin level 0.3-0.7 units/ml Monitor platelets by anticoagulation protocol: Yes   Plan:  Increase heparin to 1800 units/hour. Recheck Heparin level in AM.  CBC in AM as well.  Celedonio Miyamoto, PharmD, BCPS Clinical Pharmacist Pager [phone]  07/03/2012, 9:06 PM

## 2012-07-03 NOTE — ED Notes (Signed)
Nurse unavailable to take report on patient, clerk states the nurse will have to call us back.

## 2012-07-04 DIAGNOSIS — I2510 Atherosclerotic heart disease of native coronary artery without angina pectoris: Secondary | ICD-10-CM

## 2012-07-04 DIAGNOSIS — E785 Hyperlipidemia, unspecified: Secondary | ICD-10-CM

## 2012-07-04 DIAGNOSIS — M79609 Pain in unspecified limb: Secondary | ICD-10-CM

## 2012-07-04 DIAGNOSIS — F411 Generalized anxiety disorder: Secondary | ICD-10-CM

## 2012-07-04 LAB — CBC
HCT: 42.3 % (ref 39.0–52.0)
Hemoglobin: 14.4 g/dL (ref 13.0–17.0)
MCHC: 34 g/dL (ref 30.0–36.0)
MCV: 93 fL (ref 78.0–100.0)

## 2012-07-04 LAB — GLUCOSE, CAPILLARY

## 2012-07-04 LAB — HEPARIN LEVEL (UNFRACTIONATED): Heparin Unfractionated: 0.45 IU/mL (ref 0.30–0.70)

## 2012-07-04 MED ORDER — PNEUMOCOCCAL VAC POLYVALENT 25 MCG/0.5ML IJ INJ
0.5000 mL | INJECTION | INTRAMUSCULAR | Status: AC
  Administered 2012-07-05: 0.5 mL via INTRAMUSCULAR
  Filled 2012-07-04: qty 0.5

## 2012-07-04 MED ORDER — LIVING WELL WITH DIABETES BOOK
Freq: Once | Status: AC
  Administered 2012-07-04: 17:00:00
  Filled 2012-07-04: qty 1

## 2012-07-04 MED ORDER — INFLUENZA VIRUS VACC SPLIT PF IM SUSP
0.5000 mL | Freq: Once | INTRAMUSCULAR | Status: AC
  Administered 2012-07-05: 0.5 mL via INTRAMUSCULAR
  Filled 2012-07-04: qty 0.5

## 2012-07-04 NOTE — Progress Notes (Signed)
[facility] PROGRESS NOTE  Dustin Cruz QMV:[id number] DOB: 07-12-1966 DOA: 07/02/2012 PCP: MUSE,ROCHELLE D., PA  Assessment/Plan: Principal Problem:  *Numbness of foot Active Problems:  CAD (coronary artery disease), native coronary artery  Hypercholesterolemia  Insulin dependent type 2 diabetes mellitus, controlled  Tobacco abuse  GERD (gastroesophageal reflux disease)  Obesity  Anxiety  1. Right leg numbness : his ABI and dopllers show adequate perfusion. He probably has diabetic neuropathy. Carotid duplex pending. Patient is already on neurontin at home. If his carotid duplex is negative he can be discharged tomorrow.  2.  Diabetes Mellitus: hgba1c is 8.8. CBG (last 3)   Basename 07/04/12 0559 07/03/12 2142 07/03/12 1648  GLUCAP 99 239* 109*    cbg's are better controlled.   3. CAD: pt is ches tpain free. And enzymes have been negative.   continue with the rest of medicaitons.  DVT PROHPYLAXIS; on heparin.   Brief narrative:  Dustin Cruz is an 46 y.o. male. Obese Caucasian gentleman multiple medical problems including, diabetes, hyperlipidemia, hypertension, tobacco abuse, obesity, and STEMI in January 2012 treated with BMS, presents with almost 3 hours of numbness, tingling and coldness while at home resting. She did try to self medicate by taking sublingual nitroglycerin without benefit, and eventually came to the emergency room arriving after 8 PM, for evaluation. Patient had negative CT exam and negative blood work, but it was felt that his assessment of his right foot is cold was correct; his dorsalis pedis pulses could not be found by Doppler; the situation was discussed with the on-call vascular surgeon Dr. Myra Gianotti, who recommended admission to the hospitalist service at [facility] consultation by the vascular surgeon for further evaluation.      Consultants:  Vascular surgery consult.   Procedures:  ABI/  DOPPLERS.  Antibiotics:  none  HPI/Subjective: Burning sensation has resolved  Objective: Filed Vitals:   07/03/12 1329 07/03/12 2039 07/04/12 0557 07/04/12 1156  BP: 141/93 140/86 153/77 115/84  Pulse: 84 74 77 77  Temp:  97.3 F (36.3 C) 97.6 F (36.4 C)   TempSrc:  Oral Oral   Resp: 18 17 18    Height:      Weight:   99.655 kg (219 lb 11.2 oz)   SpO2: 100% 100% 97%    No intake or output data in the 24 hours ending 07/04/12 1337 Filed Weights   07/02/12 2019 07/03/12 0354 07/04/12 0557  Weight: 97.07 kg (214 lb) 101.7 kg (224 lb 3.3 oz) 99.655 kg (219 lb 11.2 oz)    Exam:   General:  Alert afebrile comfortable  Cardiovascular: s1s2  Respiratory: CTAB  Abdomen: SOFTNT ND BS+  Extremities: no pedal edema, cyanosis or clubbing. Pulses present bilateral.  Data Reviewed: Basic Metabolic Panel:  Lab 07/03/12 2841 07/02/12 2027  NA 139 134*  K 3.7 4.4  CL 104 99  CO2 27 25  GLUCOSE 144* 304*  BUN 19 25*  CREATININE 0.98 1.27  CALCIUM 9.2 9.6  MG -- --  PHOS -- --   Liver Function Tests:  Lab 07/03/12 0355 07/02/12 2027  AST 11 11  ALT 11 12  ALKPHOS 37* 46  BILITOT 0.2* 0.2*  PROT 6.6 6.9  ALBUMIN 3.8 4.0   No results found for this basename: LIPASE:5,AMYLASE:5 in the last 168 hours No results found for this basename: AMMONIA:5 in the last 168 hours CBC:  Lab 07/04/12 0645 07/03/12 0355 07/02/12 2027  WBC 6.6 6.9 5.8  NEUTROABS -- -- 3.2  HGB  14.4 13.6 13.9  HCT 42.3 39.9 40.5  MCV 93.0 93.7 93.5  PLT 364 339 368   Cardiac Enzymes:  Lab 07/03/12 1627 07/03/12 0945 07/03/12 0355 07/02/12 2027  CKTOTAL -- -- -- --  CKMB -- -- -- --  CKMBINDEX -- -- -- --  TROPONINI <0.30 <0.30 <0.30 <0.30   BNP (last 3 results)  [facility] 07/02/12 2028 01/22/12 0041  PROBNP 146.7* 101.3   CBG:  Lab 07/04/12 0559 07/03/12 2142 07/03/12 1648 07/03/12 1134 07/03/12 0747  GLUCAP 99 239* 109* 219* 137*    No results found for this or any previous  visit (from the past 240 hour(s)).   Studies: Ct Head Wo Contrast  07/02/2012  *RADIOLOGY REPORT*  Clinical Data: 46 year old male with right lower extremity numbness, chest pain.  CT HEAD WITHOUT CONTRAST  Technique:  Contiguous axial images were obtained from the base of the skull through the vertex without contrast.  Comparison: [facility] Head CT 09/21/2011.  Findings: Visualized paranasal sinuses and mastoids are clear.  No acute osseous abnormality identified.  Visualized orbits and scalp soft tissues are within normal limits.  Stable cerebral volume, within normal limits.  No ventriculomegaly. No midline shift, mass effect, or evidence of mass lesion.  No evidence of cortically based acute infarction identified.  No acute intracranial hemorrhage identified.  Dominant distal right vertebral artery again noted. No suspicious intracranial vascular hyperdensity.  IMPRESSION: Stable and negative noncontrast CT appearance of the brain.   Original Report Authenticated By: Harley Hallmark, M.D.    Dg Chest Portable 1 View  07/02/2012  *RADIOLOGY REPORT*  Clinical Data: Chest pain and short of breath  PORTABLE CHEST - 1 VIEW  Comparison: 05/05/2012  Findings: Normal heart size.  Clear lungs.  Hyperaeration. Bronchitic changes.  Interstitial prominence.  IMPRESSION: Changes related to COPD.   Original Report Authenticated By: Donavan Burnet, M.D.     Scheduled Meds:    . aspirin  325 mg Oral q morning - 10a  . atorvastatin  20 mg Oral q1800  . gabapentin  400 mg Oral BID  . gemfibrozil  600 mg Oral BID AC  . insulin aspart  0-5 Units Subcutaneous QHS  . insulin aspart  0-9 Units Subcutaneous TID WC  . insulin glargine  54 Units Subcutaneous QHS  . loratadine  10 mg Oral Daily  . metoprolol succinate  25 mg Oral Daily  . nicotine  21 mg Transdermal Daily  . prasugrel  10 mg Oral Daily  . sodium chloride  3 mL Intravenous Q12H  . DISCONTD: simvastatin  40 mg Oral q1800    Continuous Infusions:    . DISCONTD: heparin 1,800 Units/hr (07/03/12 2117)    Principal Problem:  *Numbness of foot Active Problems:  CAD (coronary artery disease), native coronary artery  Hypercholesterolemia  Insulin dependent type 2 diabetes mellitus, controlled  Tobacco abuse  GERD (gastroesophageal reflux disease)  Obesity  Anxiety       Chukwuemeka Artola  [facility] Pager [phone]. If 8PM-8AM, please contact night-coverage at [email or web address], password [email or web address] 07/04/2012, 1:37 PM  LOS: 2 days

## 2012-07-04 NOTE — Progress Notes (Signed)
VASCULAR LAB PRELIMINARY  PRELIMINARY  PRELIMINARY  PRELIMINARY  Carotid Dopplers completed.    Preliminary report:  There is no ICA stenosis.  Vertebral artery flow is antegrade.  Dandra Shambaugh, 07/04/2012, 2:46 PM

## 2012-07-04 NOTE — Progress Notes (Signed)
I agree with the above. The patient's carotid ultrasound found minimal stenosis. He had excellent blood flow to his feet. I discussed extensively with the patient the importance of regulating his blood sugars.  Dustin Cruz

## 2012-07-04 NOTE — Progress Notes (Signed)
ANTICOAGULATION CONSULT NOTE - Follow Up Consult  Pharmacy Consult for heparin Indication: r/o PVD and r/o ACS  Labs:  Basename 07/04/12 0645 07/03/12 1957 07/03/12 1627 07/03/12 1105 07/03/12 0945 07/03/12 0355 07/02/12 2028 07/02/12 2027  HGB 14.4 -- -- -- -- 13.6 -- --  HCT 42.3 -- -- -- -- 39.9 -- 40.5  PLT 364 -- -- -- -- 339 -- 368  APTT -- -- -- -- -- -- 30 --  LABPROT -- -- -- -- -- -- 11.9 --  INR -- -- -- -- -- -- 0.88 --  HEPARINUNFRC 0.45 0.27* -- 0.16* -- -- -- --  CREATININE -- -- -- -- -- 0.98 -- 1.27  CKTOTAL -- -- -- -- -- -- -- --  CKMB -- -- -- -- -- -- -- --  TROPONINI -- -- <0.30 -- <0.30 <0.30 -- --    Assessment/Plan:  46yo male now therapeutic on heparin after rate increases.  Will continue gtt at current rate and confirm stable with additional level.  Colleen Can PharmD BCPS 07/04/2012,7:36 AM

## 2012-07-04 NOTE — Progress Notes (Signed)
[facility] Progress Note  07/04/2012 10:38 AM HD 2  Subjective:  States his feet are feeling a little better  Afebrile VSS  Filed Vitals:   07/04/12 0557  BP: 153/77  Pulse: 77  Temp: 97.6 F (36.4 C)  Resp: 18    Physical Exam:  Extremities:  Bilateral feet warm  CBC    Component Value Date/Time   WBC 6.6 07/04/2012 0645   RBC 4.55 07/04/2012 0645   HGB 14.4 07/04/2012 0645   HCT 42.3 07/04/2012 0645   PLT 364 07/04/2012 0645   MCV 93.0 07/04/2012 0645   MCH 31.6 07/04/2012 0645   MCHC 34.0 07/04/2012 0645   RDW 12.9 07/04/2012 0645   LYMPHSABS 2.0 07/02/2012 2027   MONOABS 0.5 07/02/2012 2027   EOSABS 0.1 07/02/2012 2027   BASOSABS 0.0 07/02/2012 2027    BMET    Component Value Date/Time   NA 139 07/03/2012 0355   K 3.7 07/03/2012 0355   CL 104 07/03/2012 0355   CO2 27 07/03/2012 0355   GLUCOSE 144* 07/03/2012 0355   BUN 19 07/03/2012 0355   CREATININE 0.98 07/03/2012 0355   CALCIUM 9.2 07/03/2012 0355   GFRNONAA >90 07/03/2012 0355   GFRAA >90 07/03/2012 0355    INR    Component Value Date/Time   INR 0.88 07/02/2012 2028     Intake/Output Summary (Last 24 hours) at 07/04/12 1038 Last data filed at 07/03/12 1300  Gross per 24 hour  Intake    360 ml  Output      0 ml  Net    360 ml    RIGHT    LEFT     PRESSURE  WAVEFORM   PRESSURE  WAVEFORM   BRACHIAL  142  Triphasic  BRACHIAL  141  Triphasic          AT  155  Triphasic  AT  156  Triphasic   PT  184  Triphasic  PT  179  Triphasic          GREAT TOE  82   GREAT TOE  86      RIGHT  LEFT   ABI  1.3  1.26   ABIs and Doppler waveforms indicate normal arterial flow bilaterally at rest. Great toe pressures suggest adequate perfusion bilaterally.   Assessment/Plan:  46 y.o. male admitted with probable diabetic neuropathy HD 2  -pt ABI's are normal and suggest adequate perfusion. -awaiting carotid duplex to make sure pt has not had TIA. (pt states this has not been performed) In orders, it  is scheduled for 07/04/12.  Doreatha Massed, PA-C [facility] [phone] 07/04/2012 10:38 AM

## 2012-07-05 DIAGNOSIS — R079 Chest pain, unspecified: Secondary | ICD-10-CM

## 2012-07-05 DIAGNOSIS — K219 Gastro-esophageal reflux disease without esophagitis: Secondary | ICD-10-CM

## 2012-07-05 LAB — CBC
HCT: 42.3 % (ref 39.0–52.0)
Platelets: 379 10*3/uL (ref 150–400)
RBC: 4.57 MIL/uL (ref 4.22–5.81)
RDW: 12.8 % (ref 11.5–15.5)
WBC: 7 10*3/uL (ref 4.0–10.5)

## 2012-07-05 LAB — GLUCOSE, CAPILLARY: Glucose-Capillary: 148 mg/dL — ABNORMAL HIGH (ref 70–99)

## 2012-07-05 MED ORDER — GABAPENTIN 400 MG PO CAPS: 400.0000 mg | ORAL_CAPSULE | Freq: Three times a day (TID) | ORAL | Status: DC

## 2012-07-05 MED ORDER — ROSUVASTATIN CALCIUM 10 MG PO TABS: 10.0000 mg | ORAL_TABLET | Freq: Every day | ORAL | Status: DC

## 2012-07-05 NOTE — Discharge Summary (Signed)
Physician Discharge Summary  Patient ID: Dustin Cruz MRN: [id number] DOB/AGE: 47-Apr-1967 47 y.o.  Admit date: 07/02/2012 Discharge date: 07/05/2012  Primary Care Physician:  MUSE,ROCHELLE D., PA  Discharge Diagnoses:   Right foot numbness likely secondary to diabetic neuropathy, negative carotid Dopplers .CAD (coronary artery disease), native coronary artery .Hypercholesterolemia .Obesity .Anxiety .GERD (gastroesophageal reflux disease) .Tobacco abuse .Insulin dependent type 2 diabetes mellitus, controlled   Consults:  Vascular surgery  Discharge Medications:   Medication List     As of 07/05/2012  8:48 AM    STOP taking these medications         simvastatin 40 MG tablet   Commonly known as: ZOCOR      UNKNOWN TO PATIENT      TAKE these medications         aspirin 325 MG EC tablet   Take 325 mg by mouth every morning.      gabapentin 400 MG capsule   Commonly known as: NEURONTIN   Take 1 capsule (400 mg total) by mouth 3 (three) times daily.      gemfibrozil 600 MG tablet   Commonly known as: LOPID   Take 600 mg by mouth 2 (two) times daily before a meal.      glipiZIDE 5 MG tablet   Commonly known as: GLUCOTROL   Take 5 mg by mouth 2 (two) times daily before a meal.      LANTUS SOLOSTAR 100 UNIT/ML injection   Generic drug: insulin glargine   Inject 54 Units into the skin at bedtime.      loratadine 10 MG tablet   Commonly known as: CLARITIN   Take 10 mg by mouth daily.      metFORMIN 1000 MG tablet   Commonly known as: GLUCOPHAGE   Take 1 tablet (1,000 mg total) by mouth 2 (two) times daily with a meal. HOLD for 48 hours, restart on April 18th.      metoprolol succinate 25 MG 24 hr tablet   Commonly known as: TOPROL-XL   Take 1 tablet (25 mg total) by mouth daily.      naproxen 500 MG tablet   Commonly known as: NAPROSYN   Take 500 mg by mouth as needed. pain      nitroGLYCERIN 0.4 MG SL tablet   Commonly known as: NITROSTAT   Place 1 tablet  (0.4 mg total) under the tongue every 5 (five) minutes as needed. For chest pain.      prasugrel 10 MG Tabs   Commonly known as: EFFIENT   Take 1 tablet (10 mg total) by mouth daily.      rosuvastatin 10 MG tablet   Commonly known as: CRESTOR   Take 1 tablet (10 mg total) by mouth daily.      traZODone 50 MG tablet   Commonly known as: DESYREL   Take 50 mg by mouth at bedtime as needed.         Brief H and P: For complete details please refer to admission H and P, but in brief, Dustin Cruz is an 46 y.o. male. Obese Caucasian gentleman multiple medical problems including, diabetes, hyperlipidemia, hypertension, tobacco abuse, obesity, and STEMI in January 2012 treated with BMS, presents with almost 3 hours of numbness, tingling and coldness while at home resting. She did try to self medicate by taking sublingual nitroglycerin without benefit, and eventually came to the emergency room arriving after 8 PM, for evaluation.  Patient had negative CT exam and  negative blood work, but it was felt that his assessment of his right foot is cold was correct; his dorsalis pedis pulses could not be found by Doppler; the situation was discussed with the on-call vascular surgeon Dr. Myra Gianotti, who recommended admission to the hospitalist service at [facility] consultation by the vascular surgeon for further evaluation. The patient was complaining of episodic numbness and tingling of the right foot.   Hospital Course:   Dustin Cruz is an 46 y.o. male. Obese Caucasian gentleman multiple medical problems including, diabetes, hyperlipidemia, hypertension, tobacco abuse, obesity, and STEMI in January 2012 treated with BMS, presented with almost 3 hours of numbness, tingling and coldness while at home resting.He did try to self medicate by taking sublingual nitroglycerin without benefit, and eventually came to the emergency room arriving after 8 PM, for evaluation. Patient had negative CT exam and negative blood  work, but it was felt that his assessment of his right foot is cold was correct; his dorsalis pedis pulses could not be found by Doppler; the situation was discussed with the on-call vascular surgeon Dr. Myra Gianotti, who recommended admission to the hospitalist service and consultation by the vascular surgeon for further evaluation. Patient underwent carotid Dopplers which showed no ICA stenosis. Patient also had ABIs which were normal. patient was evaluated by Dr. Myra Gianotti who felt that patient had excellent blood flow to his feet and he discussed extensively with the patient importance of regulating his blood sugars and likely his symptoms were due to to the neuropathy rather than vascular disease.    Day of Discharge BP 109/68  Pulse 76  Temp 98.4 F (36.9 C) (Oral)  Resp 16  Ht 6\' 2"  (1.88 m)  Wt 97.4 kg (214 lb 11.7 oz)  BMI 27.57 kg/m2  SpO2 93%  Physical Exam: General: Alert and awake oriented x3 not in any acute distress. HEENT: anicteric sclera, pupils reactive to light and accommodation CVS: S1-S2 clear no murmur rubs or gallops Chest: clear to auscultation bilaterally, no wheezing rales or rhonchi Abdomen: soft nontender, nondistended, normal bowel sounds, no organomegaly Extremities: no cyanosis, clubbing or edema noted bilaterally Neuro: Cranial nerves II-XII intact, no focal neurological deficits   The results of significant diagnostics from this hospitalization (including imaging, microbiology, ancillary and laboratory) are listed below for reference.    LAB RESULTS: Basic Metabolic Panel:  Lab 07/03/12 1610 07/02/12 2027  NA 139 134*  K 3.7 4.4  CL 104 99  CO2 27 25  GLUCOSE 144* 304*  BUN 19 25*  CREATININE 0.98 1.27  CALCIUM 9.2 9.6  MG -- --  PHOS -- --   Liver Function Tests:  Lab 07/03/12 0355 07/02/12 2027  AST 11 11  ALT 11 12  ALKPHOS 37* 46  BILITOT 0.2* 0.2*  PROT 6.6 6.9  ALBUMIN 3.8 4.0   No results found for this basename: LIPASE:2,AMYLASE:2  in the last 168 hours No results found for this basename: AMMONIA:2 in the last 168 hours CBC:  Lab 07/05/12 0440 07/04/12 0645 07/02/12 2027  WBC 7.0 6.6 --  NEUTROABS -- -- 3.2  HGB 14.7 14.4 --  HCT 42.3 42.3 --  MCV 92.6 -- --  PLT 379 364 --   Cardiac Enzymes:  Lab 07/03/12 1627 07/03/12 0945  CKTOTAL -- --  CKMB -- --  CKMBINDEX -- --  TROPONINI <0.30 <0.30   BNP: No components found with this basename: POCBNP:2 CBG:  Lab 07/05/12 0605 07/04/12 2106  GLUCAP 148* 187*  Significant Diagnostic Studies:  Ct Head Wo Contrast  07/02/2012  *RADIOLOGY REPORT*  Clinical Data: 46 year old male with right lower extremity numbness, chest pain.  CT HEAD WITHOUT CONTRAST  Technique:  Contiguous axial images were obtained from the base of the skull through the vertex without contrast.  Comparison: [facility] Head CT 09/21/2011.  Findings: Visualized paranasal sinuses and mastoids are clear.  No acute osseous abnormality identified.  Visualized orbits and scalp soft tissues are within normal limits.  Stable cerebral volume, within normal limits.  No ventriculomegaly. No midline shift, mass effect, or evidence of mass lesion.  No evidence of cortically based acute infarction identified.  No acute intracranial hemorrhage identified.  Dominant distal right vertebral artery again noted. No suspicious intracranial vascular hyperdensity.  IMPRESSION: Stable and negative noncontrast CT appearance of the brain.   Original Report Authenticated By: Harley Hallmark, M.D.    Dg Chest Portable 1 View  07/02/2012  *RADIOLOGY REPORT*  Clinical Data: Chest pain and short of breath  PORTABLE CHEST - 1 VIEW  Comparison: 05/05/2012  Findings: Normal heart size.  Clear lungs.  Hyperaeration. Bronchitic changes.  Interstitial prominence.  IMPRESSION: Changes related to COPD.   Original Report Authenticated By: Donavan Burnet, M.D.      Disposition and Follow-up:     Discharge Orders     Future Orders Please Complete By Expires   Diet Carb Modified      Increase activity slowly          DISPOSITION: home DIET: carb modified ACTIVITY: as tolerated   DISCHARGE FOLLOW-UP Follow-up Information    Follow up with MUSE,ROCHELLE D., PA. Schedule an appointment as soon as possible for a visit in 2 weeks. (for hospital follow-up )    Contact information:   [address] [place] [address] [phone]          Time spent on Discharge: 35 minutes  Signed:   RAI,RIPUDEEP M.D. [facility] 07/05/2012, 8:48 AM Pager: [phone]

## 2012-07-05 NOTE — Progress Notes (Signed)
Pt carotid duplex is unremarkable with antegrade vertebral flow.  His ABI's were also normal.    This is most likely diabetic neuropathy and not a vascular issue.    Will sign off.  Doreatha Massed 07/05/2012 7:46 AM

## 2012-07-07 NOTE — Progress Notes (Signed)
I agree with the above To follow up in the office Most likely explanation for numbness is neuropathy for DM I discussed the importance of blood sugar control  Dustin Cruz

## 2012-07-28 ENCOUNTER — Other Ambulatory Visit: Payer: Self-pay

## 2012-07-28 ENCOUNTER — Emergency Department (HOSPITAL_COMMUNITY)
Admission: EM | Admit: 2012-07-28 | Discharge: 2012-07-28 | Disposition: A | Discharge status: Home / Self Care | Payer: Medicaid Other | Attending: Emergency Medicine | Admitting: Emergency Medicine

## 2012-07-28 ENCOUNTER — Encounter (HOSPITAL_COMMUNITY): Payer: Self-pay | Admitting: *Deleted

## 2012-07-28 ENCOUNTER — Emergency Department (HOSPITAL_COMMUNITY): Payer: Medicaid Other

## 2012-07-28 DIAGNOSIS — K219 Gastro-esophageal reflux disease without esophagitis: Secondary | ICD-10-CM | POA: Insufficient documentation

## 2012-07-28 DIAGNOSIS — I10 Essential (primary) hypertension: Secondary | ICD-10-CM | POA: Insufficient documentation

## 2012-07-28 DIAGNOSIS — I252 Old myocardial infarction: Secondary | ICD-10-CM | POA: Insufficient documentation

## 2012-07-28 DIAGNOSIS — R0602 Shortness of breath: Secondary | ICD-10-CM | POA: Insufficient documentation

## 2012-07-28 DIAGNOSIS — R079 Chest pain, unspecified: Secondary | ICD-10-CM

## 2012-07-28 DIAGNOSIS — Z794 Long term (current) use of insulin: Secondary | ICD-10-CM | POA: Insufficient documentation

## 2012-07-28 DIAGNOSIS — Z79899 Other long term (current) drug therapy: Secondary | ICD-10-CM | POA: Insufficient documentation

## 2012-07-28 DIAGNOSIS — I2510 Atherosclerotic heart disease of native coronary artery without angina pectoris: Secondary | ICD-10-CM | POA: Insufficient documentation

## 2012-07-28 LAB — CBC WITH DIFFERENTIAL/PLATELET
Basophils Absolute: 0 10*3/uL (ref 0.0–0.1)
Lymphocytes Relative: 38 % (ref 12–46)
Neutro Abs: 3.4 10*3/uL (ref 1.7–7.7)
Neutrophils Relative %: 48 % (ref 43–77)
Platelets: 385 10*3/uL (ref 150–400)
RBC: 4.01 MIL/uL — ABNORMAL LOW (ref 4.22–5.81)
RDW: 13.4 % (ref 11.5–15.5)
WBC: 7.1 10*3/uL (ref 4.0–10.5)

## 2012-07-28 LAB — BASIC METABOLIC PANEL
Calcium: 9.3 mg/dL (ref 8.4–10.5)
Chloride: 101 mEq/L (ref 96–112)
Creatinine, Ser: 0.96 mg/dL (ref 0.50–1.35)
GFR calc Af Amer: >90 mL/min (ref >90)
Sodium: 136 mEq/L (ref 135–145)

## 2012-07-28 LAB — TROPONIN I: Troponin I: <0.3 ng/mL (ref <0.30)

## 2012-07-28 MED ORDER — ALBUTEROL SULFATE HFA 108 (90 BASE) MCG/ACT IN AERS: 1.0000 | INHALATION_SPRAY | Freq: Four times a day (QID) | RESPIRATORY_TRACT | Status: DC | PRN

## 2012-07-28 MED ORDER — ALBUTEROL SULFATE (5 MG/ML) 0.5% IN NEBU
2.5000 mg | INHALATION_SOLUTION | Freq: Once | RESPIRATORY_TRACT | Status: AC
  Administered 2012-07-28: 2.5 mg via RESPIRATORY_TRACT
  Filled 2012-07-28: qty 0.5

## 2012-07-28 NOTE — ED Provider Notes (Signed)
History     CSN: [id number]  Arrival date & time 07/28/12  0127   First MD Initiated Contact with Patient 07/28/12 0207      Chief Complaint  Patient presents with  . Respiratory Distress    (Consider location/radiation/quality/duration/timing/severity/associated sxs/prior treatment) HPI Dustin Cruz is a 46 y.o. male brought in by ambulance, who presents to the Emergency Department complaining of shortness of breath and chest discomfort that began yesterday and has gotten progressively worse. Occasional wheezing accompanied by cough. Denies fever, chills, nausea, vomiting. Smokes 1pk/day.  PCP Dr. Ledell Peoples   Past Medical History  Diagnosis Date  . Diabetes mellitus     Type 2  . Hyperlipidemia   . Depression   . Coronary artery disease     s/p BMS to Ramus 10/2010;  Cath 01/22/12 prox 30-40% LAD, LCx ramus w/ hazy 70-80% in-stent restenosis, EF 60% treated medically  . HTN (hypertension)   . Tobacco abuse   . Obesity   . Anxiety   . Melanoma 2007    surgery at [facility]  . Myocardial infarction 2012  . GERD (gastroesophageal reflux disease)   . Hyperlipidemia     Past Surgical History  Procedure Date  . Melanoma surgery 2007    [facility], removed lymph nodes under arm as well  . Cardiac catheterization     with stent  . Esophagogastroduodenoscopy 04/24/2012    Procedure: ESOPHAGOGASTRODUODENOSCOPY (EGD);  Surgeon: Corbin Ade, MD;  Location: [facility];  Service: Endoscopy;  Laterality: N/A;    Family History  Problem Relation Age of Onset  . Lung cancer    . Heart disease    . Other      not real familiar with family history    History  Substance Use Topics  . Smoking status: Current Every Day Smoker -- 1.0 packs/day for 30 years    Types: Cigarettes  . Smokeless tobacco: Never Used  . 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       10 Systems reviewed and are negative for  acute change except as noted in the HPI.  HENT: Negative for congestion.   Eyes: Negative for discharge and redness.  Respiratory: Positive for cough, chest tightness, shortness of breath and wheezing.   Cardiovascular: Negative for chest pain.  Gastrointestinal: Negative for vomiting and abdominal pain.  Musculoskeletal: Negative for back pain.  Skin: Negative for rash.  Neurological: Negative for syncope, numbness and headaches.  Psychiatric/Behavioral:       No behavior change.    Allergies  Review of patient's allergies indicates no known allergies.  Home Medications   Current Outpatient Rx  Name Route Sig Dispense Refill  . PANTOPRAZOLE SODIUM 40 MG PO TBEC Oral Take 40 mg by mouth daily.    [name] PIOGLITAZONE HCL 15 MG PO TABS Oral Take 15 mg by mouth daily.    . ASPIRIN 325 MG PO TBEC Oral Take 325 mg by mouth every morning.     [name] GABAPENTIN 400 MG PO CAPS Oral Take 1 capsule (400 mg total) by mouth 3 (three) times daily. 90 capsule 3  . GEMFIBROZIL 600 MG PO TABS Oral Take 600 mg by mouth 2 (two) times daily before a meal.      . GLIPIZIDE 5 MG PO TABS Oral Take 5 mg by mouth 2 (two) times daily before a meal.    .  INSULIN GLARGINE 100 UNIT/ML Hernandez SOLN Subcutaneous Inject 54 Units into the skin at bedtime.     [name] LORATADINE 10 MG PO TABS Oral Take 10 mg by mouth daily.    [name] METFORMIN HCL 1000 MG PO TABS Oral Take 1 tablet (1,000 mg total) by mouth 2 (two) times daily with a meal. HOLD for 48 hours, restart on April 18th.    . METOPROLOL SUCCINATE ER 25 MG PO TB24 Oral Take 1 tablet (25 mg total) by mouth daily. 30 tablet 12  . NAPROXEN 500 MG PO TABS Oral Take 500 mg by mouth as needed. pain    . NITROGLYCERIN 0.4 MG SL SUBL Sublingual Place 1 tablet (0.4 mg total) under the tongue every 5 (five) minutes as needed. For chest pain. 25 tablet 3  . PRASUGREL HCL 10 MG PO TABS Oral Take 1 tablet (10 mg total) by mouth daily. 30 tablet 12  . ROSUVASTATIN CALCIUM 10 MG PO TABS Oral Take  1 tablet (10 mg total) by mouth daily. 30 tablet 3  . TRAZODONE HCL 50 MG PO TABS Oral Take 50 mg by mouth at bedtime as needed.       BP 149/84  Pulse 89  Resp 21  SpO2 95%  Physical Exam  Nursing note and vitals reviewed. Constitutional: He is oriented to person, place, and time. He appears well-developed and well-nourished.       Awake, alert, nontoxic appearance.  HENT:  Head: Atraumatic.  Eyes: Right eye exhibits no discharge. Left eye exhibits no discharge.  Neck: Neck supple.  Cardiovascular: Normal heart sounds.   Pulmonary/Chest: Effort normal. He has wheezes. He exhibits no tenderness.       Occasional end expiratory wheezing  Abdominal: Soft. Bowel sounds are normal. There is no tenderness. There is no rebound.  Musculoskeletal: Normal range of motion. He exhibits edema. He exhibits no tenderness.       Baseline ROM, no obvious new focal weakness.  Neurological: He is alert and oriented to person, place, and time.       Mental status and motor strength appears baseline for patient and situation.  Skin: No rash noted.  Psychiatric: He has a normal mood and affect.    ED Course  Procedures (including critical care time) Results for orders placed during the hospital encounter of 07/28/12  BASIC METABOLIC PANEL      Component Value Range   Sodium 136  135 - 145 mEq/L   Potassium 3.7  3.5 - 5.1 mEq/L   Chloride 101  96 - 112 mEq/L   CO2 28  19 - 32 mEq/L   Glucose, Bld 97  70 - 99 mg/dL   BUN 19  6 - 23 mg/dL   Creatinine, Ser 1.61  0.50 - 1.35 mg/dL   Calcium 9.3  8.4 - 09.6 mg/dL   GFR calc non Af Amer >90  >90 mL/min   GFR calc Af Amer >90  >90 mL/min  CBC WITH DIFFERENTIAL      Component Value Range   WBC 7.1  4.0 - 10.5 K/uL   RBC 4.01 (*) 4.22 - 5.81 MIL/uL   Hemoglobin 13.0  13.0 - 17.0 g/dL   HCT 04.5 (*) 40.9 - 81.1 %   MCV 94.3  78.0 - 100.0 fL   MCH 32.4  26.0 - 34.0 pg   MCHC 34.4  30.0 - 36.0 g/dL   RDW 91.4  78.2 - 95.6 %   Platelets 385  150  -  400 K/uL   Neutrophils Relative 48  43 - 77 %   Neutro Abs 3.4  1.7 - 7.7 K/uL   Lymphocytes Relative 38  12 - 46 %   Lymphs Abs 2.7  0.7 - 4.0 K/uL   Monocytes Relative 11  3 - 12 %   Monocytes Absolute 0.8  0.1 - 1.0 K/uL   Eosinophils Relative 2  0 - 5 %   Eosinophils Absolute 0.2  0.0 - 0.7 K/uL   Basophils Relative 1  0 - 1 %   Basophils Absolute 0.0  0.0 - 0.1 K/uL  TROPONIN I      Component Value Range   Troponin I <0.30  <0.30 ng/mL    Dg Chest Port 1 View  07/28/2012  *RADIOLOGY REPORT*  Clinical Data: Shortness of breath for 2 days.  PORTABLE CHEST - 1 VIEW  Comparison: 07/02/2012  Findings: Shallow inspiration. The heart size and pulmonary vascularity are normal. The lungs appear clear and expanded without focal air space disease or consolidation. No blunting of the costophrenic angles.  No pneumothorax.  Mediastinal contours appear intact.  No significant change since previous study.  IMPRESSION: No evidence of active pulmonary disease.   Original Report Authenticated By: Marlon Pel, M.D.      Date: 07/28/2012  0143  Rate: 88  Rhythm: normal sinus rhythm  QRS Axis: left  Intervals: normal  ST/T Wave abnormalities: normal  Conduction Disutrbances:left bundle branch block  Narrative Interpretation:   Old EKG Reviewed: unchanged c/w 07/02/2012     MDM  Patient presents with shortness of breath and wheezing. Given albuterol nebulizer treatment with improvement in wheezing. Shortness of breath resolved. Labs unremarkable. Troponin negative. EKG with LBBB which is normal for this patient. Chest xray unremarkable. Dx testing d/w pt.  Questions answered.  Verb understanding, agreeable to d/c home with outpt f/u. Pt feels improved after observation and/or treatment in ED.Pt stable in ED with no significant deterioration in condition.The patient appears reasonably screened and/or stabilized for discharge and I doubt any other medical condition or other [facility] requiring  further screening, evaluation, or treatment in the ED at this time prior to discharge.  MDM Reviewed: nursing note and vitals Interpretation: labs, ECG and x-ray            Nicoletta Dress. Colon Branch, MD 07/28/12 [phone]

## 2012-07-28 NOTE — ED Notes (Signed)
Pt states relief from breathing treatment. Resting in bed at this time and no complaints voiced

## 2012-07-28 NOTE — ED Notes (Signed)
Pt presents EMS, states she has had shortness of breath for the last couple days and that it has gotten worse. Notes that 02 via ems made him feel a lot better. Also notes some mild chest pain he states is a "burning" and is intermittent.

## 2012-07-28 NOTE — ED Notes (Addendum)
Pt arrived from home via ems d/t difficulty breathing. Pt states breathing is an ongoing sx. Pt has been treated recently for sx with no relief. Pt also c/o chest discomfort increases with deep breath and palpation.

## 2012-08-03 ENCOUNTER — Encounter (HOSPITAL_COMMUNITY): Payer: Self-pay | Admitting: Emergency Medicine

## 2012-08-03 ENCOUNTER — Emergency Department (HOSPITAL_COMMUNITY): Payer: Medicaid Other

## 2012-08-03 ENCOUNTER — Observation Stay (HOSPITAL_COMMUNITY)
Admission: EM | Admit: 2012-08-03 | Discharge: 2012-08-05 | Disposition: A | Discharge status: Home / Self Care | Payer: Medicaid Other | Attending: Internal Medicine | Admitting: Internal Medicine

## 2012-08-03 DIAGNOSIS — Z79899 Other long term (current) drug therapy: Secondary | ICD-10-CM | POA: Insufficient documentation

## 2012-08-03 DIAGNOSIS — E782 Mixed hyperlipidemia: Secondary | ICD-10-CM | POA: Diagnosis present

## 2012-08-03 DIAGNOSIS — E785 Hyperlipidemia, unspecified: Secondary | ICD-10-CM

## 2012-08-03 DIAGNOSIS — R079 Chest pain, unspecified: Principal | ICD-10-CM | POA: Diagnosis present

## 2012-08-03 DIAGNOSIS — R141 Gas pain: Secondary | ICD-10-CM | POA: Insufficient documentation

## 2012-08-03 DIAGNOSIS — R0602 Shortness of breath: Secondary | ICD-10-CM | POA: Insufficient documentation

## 2012-08-03 DIAGNOSIS — E669 Obesity, unspecified: Secondary | ICD-10-CM | POA: Insufficient documentation

## 2012-08-03 DIAGNOSIS — J209 Acute bronchitis, unspecified: Secondary | ICD-10-CM | POA: Insufficient documentation

## 2012-08-03 DIAGNOSIS — E1122 Type 2 diabetes mellitus with diabetic chronic kidney disease: Secondary | ICD-10-CM | POA: Diagnosis present

## 2012-08-03 DIAGNOSIS — Z720 Tobacco use: Secondary | ICD-10-CM

## 2012-08-03 DIAGNOSIS — I10 Essential (primary) hypertension: Secondary | ICD-10-CM

## 2012-08-03 DIAGNOSIS — E119 Type 2 diabetes mellitus without complications: Secondary | ICD-10-CM

## 2012-08-03 DIAGNOSIS — R142 Eructation: Secondary | ICD-10-CM | POA: Insufficient documentation

## 2012-08-03 DIAGNOSIS — I2510 Atherosclerotic heart disease of native coronary artery without angina pectoris: Secondary | ICD-10-CM | POA: Diagnosis present

## 2012-08-03 DIAGNOSIS — Z794 Long term (current) use of insulin: Secondary | ICD-10-CM

## 2012-08-03 DIAGNOSIS — R109 Unspecified abdominal pain: Secondary | ICD-10-CM | POA: Insufficient documentation

## 2012-08-03 DIAGNOSIS — F17200 Nicotine dependence, unspecified, uncomplicated: Secondary | ICD-10-CM | POA: Diagnosis present

## 2012-08-03 DIAGNOSIS — K219 Gastro-esophageal reflux disease without esophagitis: Secondary | ICD-10-CM | POA: Insufficient documentation

## 2012-08-03 LAB — CBC WITH DIFFERENTIAL/PLATELET
Basophils Relative: 0 % (ref 0–1)
Eosinophils Absolute: 0.2 10*3/uL (ref 0.0–0.7)
HCT: 36.4 % — ABNORMAL LOW (ref 39.0–52.0)
Hemoglobin: 12.2 g/dL — ABNORMAL LOW (ref 13.0–17.0)
MCH: 31.5 pg (ref 26.0–34.0)
MCHC: 33.5 g/dL (ref 30.0–36.0)
Monocytes Absolute: 1 10*3/uL (ref 0.1–1.0)
Monocytes Relative: 11 % (ref 3–12)
Neutrophils Relative %: 57 % (ref 43–77)

## 2012-08-03 LAB — BASIC METABOLIC PANEL
BUN: 18 mg/dL (ref 6–23)
Calcium: 8.9 mg/dL (ref 8.4–10.5)
Creatinine, Ser: 1.01 mg/dL (ref 0.50–1.35)
GFR calc Af Amer: >90 mL/min (ref >90)
GFR calc non Af Amer: 87 mL/min — ABNORMAL LOW (ref >90)

## 2012-08-03 MED ORDER — ONDANSETRON HCL 4 MG/2ML IJ SOLN
4.0000 mg | Freq: Once | INTRAMUSCULAR | Status: AC
  Administered 2012-08-03: 4 mg via INTRAVENOUS
  Filled 2012-08-03: qty 2

## 2012-08-03 MED ORDER — HYDROMORPHONE HCL PF 1 MG/ML IJ SOLN
1.0000 mg | Freq: Once | INTRAMUSCULAR | Status: AC
  Administered 2012-08-03: 1 mg via INTRAVENOUS
  Filled 2012-08-03: qty 1

## 2012-08-03 MED ORDER — GI COCKTAIL ~~LOC~~
30.0000 mL | Freq: Once | ORAL | Status: AC
  Administered 2012-08-03: 30 mL via ORAL
  Filled 2012-08-03: qty 30

## 2012-08-03 NOTE — ED Provider Notes (Signed)
I saw and evaluated the patient, reviewed the resident's note and I agree with the findings and plan.  Patient here with chest pain similar to his anginal equivalent. Patient's EKG was reviewed and compared to his prior one and there no changes. Patient does not appear to be having a STEMI. Will order blood work and control patient's pain with nitrates and monitor with likely admission  Toy Baker, MD 08/03/12 2131

## 2012-08-03 NOTE — ED Notes (Signed)
Pt brought to ED with chest pain.No changes in EKG.CODE STEMI cancelled.

## 2012-08-03 NOTE — Progress Notes (Signed)
Spoke w/Mr. January after learning his wife could not drive at night. He wanted me to call his wife, which I did, to let her know he was here and ok.  Marjory Lies Chaplain

## 2012-08-03 NOTE — ED Notes (Signed)
Pt brought to ED by EMS  With chest pain.Nitro X 2,4 asprin,10mg  morphine iv adminstered.[name]

## 2012-08-03 NOTE — ED Provider Notes (Signed)
History     CSN: [id number]  Arrival date & time 08/03/12  2113   First MD Initiated Contact with Patient 08/03/12 2122      Chief Complaint  Patient presents with  . Chest Pain    (Consider location/radiation/quality/duration/timing/severity/associated sxs/prior treatment) HPI Chest pain.  Pain described as tightness, severe in intensity. The location of the patient's problem is central chest.  Onset was 2-4 hours ago with unchanged course since that time.   Modifying factors:  Worse with exertion.  Associated symptoms: diaphoresis. Shortness of breath. No vomiting. Does not radiate to back.   Past Medical History  Diagnosis Date  . Diabetes mellitus     Type 2  . Hyperlipidemia   . Depression   . Coronary artery disease     s/p BMS to Ramus 10/2010;  Cath 01/22/12 prox 30-40% LAD, LCx ramus w/ hazy 70-80% in-stent restenosis, EF 60% treated medically  . HTN (hypertension)   . Tobacco abuse   . Obesity   . Anxiety   . Melanoma 2007    surgery at [facility]  . Myocardial infarction 2012  . GERD (gastroesophageal reflux disease)   . Hyperlipidemia     Past Surgical History  Procedure Date  . Melanoma surgery 2007    [facility], removed lymph nodes under arm as well  . Cardiac catheterization     with stent  . Esophagogastroduodenoscopy 04/24/2012    Procedure: ESOPHAGOGASTRODUODENOSCOPY (EGD);  Surgeon: Corbin Ade, MD;  Location: [facility];  Service: Endoscopy;  Laterality: N/A;    Family History  Problem Relation Age of Onset  . Lung cancer    . Heart disease    . Other      not real familiar with family history    History  Substance Use Topics  . Smoking status: Current Every Day Smoker -- 1.0 packs/day for 30 years    Types: Cigarettes  . Smokeless tobacco: Never Used  . 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 Negative for vomiting, diarrhea.  All other systems  reviewed and negative unless noted in HPI.    Allergies  Review of patient's allergies indicates no known allergies.  Home Medications   Current Outpatient Rx  Name Route Sig Dispense Refill  . ALBUTEROL SULFATE HFA 108 (90 BASE) MCG/ACT IN AERS Inhalation Inhale 1-2 puffs into the lungs every 6 (six) hours as needed for wheezing. 1 Inhaler 0  . ASPIRIN 325 MG PO TBEC Oral Take 325 mg by mouth every morning.     [name] GABAPENTIN 400 MG PO CAPS Oral Take 1 capsule (400 mg total) by mouth 3 (three) times daily. 90 capsule 3  . GEMFIBROZIL 600 MG PO TABS Oral Take 600 mg by mouth 2 (two) times daily before a meal.      . GLIPIZIDE 5 MG PO TABS Oral Take 5 mg by mouth 2 (two) times daily before a meal.    . INSULIN GLARGINE 100 UNIT/ML [place] SOLN Subcutaneous Inject 54 Units into the skin at bedtime.     [name] LORATADINE 10 MG PO TABS Oral Take 10 mg by mouth daily.    [name] METFORMIN HCL 1000 MG PO TABS Oral Take 1 tablet (1,000 mg total) by mouth 2 (two) times daily with a meal. HOLD for 48 hours, restart on April 18th.    . METOPROLOL SUCCINATE ER 25 MG PO TB24 Oral  Take 1 tablet (25 mg total) by mouth daily. 30 tablet 12  . NAPROXEN 500 MG PO TABS Oral Take 500 mg by mouth as needed. pain    . NITROGLYCERIN 0.4 MG SL SUBL Sublingual Place 1 tablet (0.4 mg total) under the tongue every 5 (five) minutes as needed. For chest pain. 25 tablet 3  . PANTOPRAZOLE SODIUM 40 MG PO TBEC Oral Take 40 mg by mouth daily.    [name] PIOGLITAZONE HCL 15 MG PO TABS Oral Take 15 mg by mouth daily.    [name] PRASUGREL HCL 10 MG PO TABS Oral Take 1 tablet (10 mg total) by mouth daily. 30 tablet 12  . ROSUVASTATIN CALCIUM 10 MG PO TABS Oral Take 1 tablet (10 mg total) by mouth daily. 30 tablet 3  . TRAZODONE HCL 50 MG PO TABS Oral Take 50 mg by mouth at bedtime as needed.       BP 156/90  Pulse 80  Temp 98.6 F (37 C) (Oral)  Resp 14  SpO2 98%  Physical Exam Nursing note and vitals reviewed.  Constitutional: Pt is alert and  appears stated age. Oropharynx: Airway open without erythema or exudate. Respiratory: No respiratory distress. Equal breathing bilaterally. CV: Extremities warm and well perfused. Neuro: No motor nor sensory deficit. Head: Normocephalic and atraumatic. Eyes: No conjunctivitis, no scleral icterus. Neck: Supple, no mass. Chest: Non-tender. Abdomen: Soft, non-tender MSK: Extremities are atraumatic without deformity. Skin: No rash, no wounds.  ED Course  Procedures (including critical care time)  Labs Reviewed  CBC WITH DIFFERENTIAL - Abnormal; Notable for the following:    RBC 3.87 (*)     Hemoglobin 12.2 (*)     HCT 36.4 (*)     All other components within normal limits  BASIC METABOLIC PANEL - Abnormal; Notable for the following:    Glucose, Bld 161 (*)     GFR calc non Af Amer 87 (*)     All other components within normal limits  PRO B NATRIURETIC PEPTIDE - Abnormal; Notable for the following:    Pro B Natriuretic peptide (BNP) 400.3 (*)     All other components within normal limits  POCT I-STAT TROPONIN I  TROPONIN I  BASIC METABOLIC PANEL  CBC  TROPONIN I  TROPONIN I  HEMOGLOBIN A1C  MRSA PCR SCREENING   Dg Chest Portable 1 View  08/03/2012  *RADIOLOGY REPORT*  Clinical Data: Chest pain, shortness of breath  PORTABLE CHEST - 1 VIEW  Comparison: 07/28/2012  Findings: Lungs are essentially clear.  Possible mild right basilar atelectasis. No pleural effusion or pneumothorax.  Cardiomediastinal silhouette is within normal limits.  IMPRESSION: No evidence of acute cardiopulmonary disease.   Original Report Authenticated By: Charline Bills, M.D.      1. Chest pain   2. CAD (coronary artery disease), native coronary artery   3. Insulin dependent type 2 diabetes mellitus, controlled   4. HTN (hypertension)   5. Hyperlipidemia   6. Tobacco abuse       MDM  Arrived with concern for STEMI. No changed based on prior. EKG with some J point elevation. Code STEMI called  off. Pt with severe pain that he states is similar to prior MI. Will work up for ACS. Pt without doctor or cardiologist here in [place]. Pt given ASA, nitro at home and by EMS without significant improvement. Will try GI cocktail, IV dilaudid here. CXR with no acute disease. Negative troponin. On re-eval, pt still with severe chest pain. Hospitalist  called. Pt to be admitted to step down.    Medical Decision Making discussed with ED attending Dr. Freida Busman.          Charm Barges, MD 08/04/12 0230

## 2012-08-04 ENCOUNTER — Observation Stay (HOSPITAL_COMMUNITY): Payer: Medicaid Other

## 2012-08-04 ENCOUNTER — Encounter (HOSPITAL_COMMUNITY): Payer: Self-pay | Admitting: Internal Medicine

## 2012-08-04 DIAGNOSIS — I10 Essential (primary) hypertension: Secondary | ICD-10-CM

## 2012-08-04 DIAGNOSIS — I2510 Atherosclerotic heart disease of native coronary artery without angina pectoris: Secondary | ICD-10-CM

## 2012-08-04 DIAGNOSIS — E785 Hyperlipidemia, unspecified: Secondary | ICD-10-CM

## 2012-08-04 DIAGNOSIS — E119 Type 2 diabetes mellitus without complications: Secondary | ICD-10-CM

## 2012-08-04 DIAGNOSIS — R6889 Other general symptoms and signs: Secondary | ICD-10-CM

## 2012-08-04 DIAGNOSIS — R079 Chest pain, unspecified: Secondary | ICD-10-CM

## 2012-08-04 DIAGNOSIS — F17200 Nicotine dependence, unspecified, uncomplicated: Secondary | ICD-10-CM

## 2012-08-04 LAB — HEPATIC FUNCTION PANEL
ALT: 15 U/L (ref 0–53)
AST: 18 U/L (ref 0–37)
Bilirubin, Direct: <0.1 mg/dL (ref 0.0–0.3)
Total Bilirubin: 0.3 mg/dL (ref 0.3–1.2)

## 2012-08-04 LAB — HEMOGLOBIN A1C: Hgb A1c MFr Bld: 7.9 % — ABNORMAL HIGH (ref <5.7)

## 2012-08-04 LAB — CBC
HCT: 37.6 % — ABNORMAL LOW (ref 39.0–52.0)
RBC: 4 MIL/uL — ABNORMAL LOW (ref 4.22–5.81)
RDW: 13.2 % (ref 11.5–15.5)
WBC: 8.2 10*3/uL (ref 4.0–10.5)

## 2012-08-04 LAB — BASIC METABOLIC PANEL
CO2: 29 mEq/L (ref 19–32)
Chloride: 99 mEq/L (ref 96–112)
Creatinine, Ser: 1.05 mg/dL (ref 0.50–1.35)
GFR calc Af Amer: >90 mL/min (ref >90)
Potassium: 4.3 mEq/L (ref 3.5–5.1)
Sodium: 137 mEq/L (ref 135–145)

## 2012-08-04 LAB — TROPONIN I
Troponin I: <0.3 ng/mL (ref <0.30)
Troponin I: <0.3 ng/mL (ref <0.30)

## 2012-08-04 LAB — MRSA PCR SCREENING: MRSA by PCR: NEGATIVE

## 2012-08-04 MED ORDER — PANTOPRAZOLE SODIUM 40 MG PO TBEC
40.0000 mg | DELAYED_RELEASE_TABLET | Freq: Every day | ORAL | Status: DC
  Administered 2012-08-04 – 2012-08-05 (×2): 40 mg via ORAL
  Filled 2012-08-04 (×2): qty 1

## 2012-08-04 MED ORDER — INSULIN ASPART 100 UNIT/ML ~~LOC~~ SOLN: 0.0000 [IU] | Freq: Every day | SUBCUTANEOUS | Status: DC

## 2012-08-04 MED ORDER — ACETAMINOPHEN 325 MG PO TABS: 650.0000 mg | ORAL_TABLET | Freq: Four times a day (QID) | ORAL | Status: DC | PRN

## 2012-08-04 MED ORDER — ACETAMINOPHEN 650 MG RE SUPP: 650.0000 mg | Freq: Four times a day (QID) | RECTAL | Status: DC | PRN

## 2012-08-04 MED ORDER — PRASUGREL HCL 10 MG PO TABS
10.0000 mg | ORAL_TABLET | Freq: Every day | ORAL | Status: DC
  Administered 2012-08-04 – 2012-08-05 (×2): 10 mg via ORAL
  Filled 2012-08-04 (×2): qty 1

## 2012-08-04 MED ORDER — INSULIN GLARGINE 100 UNIT/ML ~~LOC~~ SOLN
45.0000 [IU] | Freq: Every day | SUBCUTANEOUS | Status: DC
  Administered 2012-08-04: 45 [IU] via SUBCUTANEOUS

## 2012-08-04 MED ORDER — NITROGLYCERIN IN D5W 200-5 MCG/ML-% IV SOLN
INTRAVENOUS | Status: AC
  Administered 2012-08-04: 5 ug/min via INTRAVENOUS
  Filled 2012-08-04: qty 250

## 2012-08-04 MED ORDER — ONDANSETRON HCL 4 MG PO TABS: 4.0000 mg | ORAL_TABLET | Freq: Four times a day (QID) | ORAL | Status: DC | PRN

## 2012-08-04 MED ORDER — INSULIN ASPART 100 UNIT/ML ~~LOC~~ SOLN
0.0000 [IU] | Freq: Three times a day (TID) | SUBCUTANEOUS | Status: DC
  Administered 2012-08-04: 1 [IU] via SUBCUTANEOUS
  Administered 2012-08-04 – 2012-08-05 (×2): 2 [IU] via SUBCUTANEOUS
  Administered 2012-08-05: 1 [IU] via SUBCUTANEOUS

## 2012-08-04 MED ORDER — OXYCODONE HCL 5 MG PO TABS: 5.0000 mg | ORAL_TABLET | ORAL | Status: DC | PRN

## 2012-08-04 MED ORDER — ALBUTEROL SULFATE (5 MG/ML) 0.5% IN NEBU
2.5000 mg | INHALATION_SOLUTION | Freq: Four times a day (QID) | RESPIRATORY_TRACT | Status: DC
  Administered 2012-08-04 – 2012-08-05 (×4): 2.5 mg via RESPIRATORY_TRACT
  Filled 2012-08-04 (×4): qty 0.5

## 2012-08-04 MED ORDER — INSULIN GLARGINE 100 UNIT/ML ~~LOC~~ SOLN
54.0000 [IU] | Freq: Every day | SUBCUTANEOUS | Status: DC
Start: 2012-08-04 — End: 2012-08-04

## 2012-08-04 MED ORDER — METOPROLOL SUCCINATE ER 25 MG PO TB24
25.0000 mg | ORAL_TABLET | Freq: Every day | ORAL | Status: DC
  Administered 2012-08-04 – 2012-08-05 (×2): 25 mg via ORAL
  Filled 2012-08-04 (×2): qty 1

## 2012-08-04 MED ORDER — ASPIRIN EC 325 MG PO TBEC
325.0000 mg | DELAYED_RELEASE_TABLET | Freq: Every day | ORAL | Status: DC
  Administered 2012-08-04 – 2012-08-05 (×2): 325 mg via ORAL
  Filled 2012-08-04 (×2): qty 1

## 2012-08-04 MED ORDER — ONDANSETRON HCL 4 MG/2ML IJ SOLN: 4.0000 mg | Freq: Four times a day (QID) | INTRAMUSCULAR | Status: DC | PRN

## 2012-08-04 MED ORDER — LORATADINE 10 MG PO TABS
10.0000 mg | ORAL_TABLET | Freq: Every day | ORAL | Status: DC
  Administered 2012-08-04 – 2012-08-05 (×2): 10 mg via ORAL
  Filled 2012-08-04 (×2): qty 1

## 2012-08-04 MED ORDER — NITROGLYCERIN IN D5W 200-5 MCG/ML-% IV SOLN
5.0000 ug/min | INTRAVENOUS | Status: DC
  Administered 2012-08-04: 5 ug/min via INTRAVENOUS

## 2012-08-04 MED ORDER — ATORVASTATIN CALCIUM 10 MG PO TABS
10.0000 mg | ORAL_TABLET | Freq: Every day | ORAL | Status: DC
  Filled 2012-08-04: qty 1

## 2012-08-04 MED ORDER — GUAIFENESIN ER 600 MG PO TB12
600.0000 mg | ORAL_TABLET | Freq: Two times a day (BID) | ORAL | Status: DC
  Administered 2012-08-04 – 2012-08-05 (×3): 600 mg via ORAL
  Filled 2012-08-04 (×4): qty 1

## 2012-08-04 MED ORDER — TRAZODONE HCL 50 MG PO TABS
50.0000 mg | ORAL_TABLET | Freq: Every evening | ORAL | Status: DC | PRN
  Filled 2012-08-04: qty 1

## 2012-08-04 MED ORDER — GEMFIBROZIL 600 MG PO TABS
600.0000 mg | ORAL_TABLET | Freq: Two times a day (BID) | ORAL | Status: DC
  Administered 2012-08-04 – 2012-08-05 (×2): 600 mg via ORAL
  Filled 2012-08-04 (×4): qty 1

## 2012-08-04 MED ORDER — ALUM & MAG HYDROXIDE-SIMETH 200-200-20 MG/5ML PO SUSP: 30.0000 mL | Freq: Four times a day (QID) | ORAL | Status: DC | PRN

## 2012-08-04 MED ORDER — HYDROMORPHONE HCL PF 1 MG/ML IJ SOLN
1.0000 mg | INTRAMUSCULAR | Status: AC | PRN
Start: 2012-08-04 — End: 2012-08-04

## 2012-08-04 MED ORDER — GABAPENTIN 400 MG PO CAPS
400.0000 mg | ORAL_CAPSULE | Freq: Three times a day (TID) | ORAL | Status: DC
  Administered 2012-08-04 – 2012-08-05 (×4): 400 mg via ORAL
  Filled 2012-08-04 (×6): qty 1

## 2012-08-04 MED ORDER — ENOXAPARIN SODIUM 40 MG/0.4ML ~~LOC~~ SOLN
40.0000 mg | SUBCUTANEOUS | Status: DC
  Administered 2012-08-04 – 2012-08-05 (×2): 40 mg via SUBCUTANEOUS
  Filled 2012-08-04 (×3): qty 0.4

## 2012-08-04 MED ORDER — LEVOFLOXACIN 750 MG PO TABS
750.0000 mg | ORAL_TABLET | ORAL | Status: DC
  Administered 2012-08-04: 750 mg via ORAL
  Filled 2012-08-04 (×3): qty 1

## 2012-08-04 MED ORDER — ONDANSETRON HCL 4 MG/2ML IJ SOLN: 4.0000 mg | Freq: Three times a day (TID) | INTRAMUSCULAR | Status: AC | PRN

## 2012-08-04 MED ORDER — SODIUM CHLORIDE 0.9 % IV SOLN
INTRAVENOUS | Status: DC
  Administered 2012-08-04: 03:00:00 via INTRAVENOUS

## 2012-08-04 MED ORDER — HYDROMORPHONE HCL PF 1 MG/ML IJ SOLN: 0.5000 mg | INTRAMUSCULAR | Status: DC | PRN

## 2012-08-04 MED ORDER — ROSUVASTATIN CALCIUM 10 MG PO TABS
10.0000 mg | ORAL_TABLET | Freq: Every day | ORAL | Status: DC
  Administered 2012-08-04: 10 mg via ORAL
  Filled 2012-08-04 (×2): qty 1

## 2012-08-04 MED ORDER — ZOLPIDEM TARTRATE 5 MG PO TABS: 5.0000 mg | ORAL_TABLET | Freq: Every evening | ORAL | Status: DC | PRN

## 2012-08-04 NOTE — Progress Notes (Signed)
[facility]  Pt examined and chart reviewed. He is admitted overnight for chest pain. He points to the center of the chest and tells me the pain is more of a soreness- he admits that it hurts when he presses upon the area. He tells me he has been coughing lately and the pain is also worse when he coughs. The Nitro infusion has not made it better. The pain does not radiate. His cough started a few days ago. He quit smoking as well a few days ago.   A/P  1. Chest pain/ h/o CAD Doubt current pain is cardiac pain, cardiac enzymes are negative, EKG unchanged from prior, will d/c Nitro infusion and treat with Tylenol.   2. Acute bronchitits Likely the cause of this chest pain, start SVN treatments, Mucinex, Flutter valve and Levaquin.   3. Distended abdomen Dull to precussion, pt states that it is never this distended. Imaging results are negative for excess gas or stool. Ultrasound is negative for ascites therefore, unfortunately, this is likely all fat...  4. DM  Cont current treatment  5. HTN-  Resume home meds.    Calvert Cantor, MD [phone]

## 2012-08-04 NOTE — Progress Notes (Signed)
Called report to 5N. Report given per sbar, pt. tx at 1840.

## 2012-08-04 NOTE — ED Provider Notes (Signed)
I saw and evaluated the patient, reviewed the resident's note and I agree with the findings and plan.  Toy Baker, MD 08/04/12 2240

## 2012-08-04 NOTE — Progress Notes (Signed)
Utilization Review Completed.  

## 2012-08-04 NOTE — Progress Notes (Signed)
Pt. HTN 150-160's systolic. Currently Nitro drip infusing. Notified Dr. Butler Denmark. Pt. Home meds resumed.

## 2012-08-04 NOTE — H&P (Addendum)
[facility] History and Physical  Dustin Cruz ZOX:[id number] DOB: 15-Jan-1966 DOA: 08/03/2012  Referring physician:  PCP: Tylene Fantasia., PA  Specialists:   Chief Complaint: Chest Pain  HPI: Dustin Cruz is a 46 y.o. male with multiple medical problems including CAD with PTCA and Stent X 1 who presents to the ED with complaints of server sharp 10/10 Substernal chest pain for the past 4 hours.  He had associated symptoms of SOB, and diaphoresis.  He denied any radiation of the pain.  He took Aspirin and a NTG at home but the pain was not completely relieved even while in the ED.  His ED workup was negative and he was referred for medical admission for furtheer evaluation.  On examination he rates the pain is now an 7/10 after morphine had been given. A NTG drip was ordered.     Review of Systems: The patient denies anorexia, fever, weight loss, vision loss, decreased hearing, hoarseness, syncope, dyspnea on exertion, peripheral edema, balance deficits, hemoptysis, abdominal pain, melena, hematochezia, severe indigestion/heartburn, hematuria, incontinence, genital sores, muscle weakness, suspicious skin lesions, transient blindness, difficulty walking, depression, unusual weight change, abnormal bleeding, enlarged lymph nodes, angioedema, and breast masses.    Past Medical History  Diagnosis Date  . Diabetes mellitus     Type 2  . Hyperlipidemia   . Depression   . Coronary artery disease     s/p BMS to Ramus 10/2010;  Cath 01/22/12 prox 30-40% LAD, LCx ramus w/ hazy 70-80% in-stent restenosis, EF 60% treated medically  . HTN (hypertension)   . Tobacco abuse   . Obesity   . Anxiety   . Melanoma 2007    surgery at [facility]  . Myocardial infarction 2012  . GERD (gastroesophageal reflux disease)   . Hyperlipidemia    Past Surgical History  Procedure Date  . Melanoma surgery 2007    [facility], removed lymph nodes under arm as well  . Cardiac catheterization     with stent  .  Esophagogastroduodenoscopy 04/24/2012    Procedure: ESOPHAGOGASTRODUODENOSCOPY (EGD);  Surgeon: Corbin Ade, MD;  Location: [facility];  Service: Endoscopy;  Laterality: N/A;    Prior to Admission medications   Medication Sig Start Date End Date Taking? Authorizing Provider  albuterol (PROVENTIL HFA;VENTOLIN HFA) 108 (90 BASE) MCG/ACT inhaler Inhale 1-2 puffs into the lungs every 6 (six) hours as needed for wheezing. 07/28/12  Yes Nicoletta Dress. Colon Branch, MD  aspirin 325 MG EC tablet Take 325 mg by mouth every morning.    Yes Historical Provider, MD  gabapentin (NEURONTIN) 400 MG capsule Take 1 capsule (400 mg total) by mouth 3 (three) times daily. 07/05/12  Yes Ripudeep Jenna Luo, MD  gemfibrozil (LOPID) 600 MG tablet Take 600 mg by mouth 2 (two) times daily before a meal.     Yes Historical Provider, MD  glipiZIDE (GLUCOTROL) 5 MG tablet Take 5 mg by mouth 2 (two) times daily before a meal.   Yes Historical Provider, MD  insulin glargine (LANTUS SOLOSTAR) 100 UNIT/ML injection Inject 54 Units into the skin at bedtime.    Yes Historical Provider, MD  loratadine (CLARITIN) 10 MG tablet Take 10 mg by mouth daily.   Yes Historical Provider, MD  metFORMIN (GLUCOPHAGE) 1000 MG tablet Take 1 tablet (1,000 mg total) by mouth 2 (two) times daily with a meal. HOLD for 48 hours, restart on April 18th. 01/23/12  Yes Joline Salt Barrett, PA  metoprolol succinate (TOPROL-XL) 25 MG 24 hr tablet  Take 1 tablet (25 mg total) by mouth daily. 12/27/11  Yes Jodelle Gross, NP  naproxen (NAPROSYN) 500 MG tablet Take 500 mg by mouth as needed. pain   Yes Historical Provider, MD  nitroGLYCERIN (NITROSTAT) 0.4 MG SL tablet Place 1 tablet (0.4 mg total) under the tongue every 5 (five) minutes as needed. For chest pain. 01/23/12  Yes Rhonda G Barrett, PA  pantoprazole (PROTONIX) 40 MG tablet Take 40 mg by mouth daily.   Yes Historical Provider, MD  pioglitazone (ACTOS) 15 MG tablet Take 15 mg by mouth daily.   Yes Historical  Provider, MD  prasugrel (EFFIENT) 10 MG TABS Take 1 tablet (10 mg total) by mouth daily. 12/27/11  Yes Jodelle Gross, NP  rosuvastatin (CRESTOR) 10 MG tablet Take 1 tablet (10 mg total) by mouth daily. 07/05/12  Yes Ripudeep Jenna Luo, MD  traZODone (DESYREL) 50 MG tablet Take 50 mg by mouth at bedtime as needed.    Yes Historical Provider, MD    No Known Allergies   Social History:  reports that he has been smoking Cigarettes.  He has a 30 [place]-year smoking history. He has never used smokeless tobacco. He reports that he does not drink alcohol or use illicit drugs.    Family History  Problem Relation Age of Onset  . Lung cancer    . Heart disease    . Other      not real familiar with family history    Physical Exam:  GEN:  Pleasant 46 year old well nourished and well developed Caucasian Male examined  and in discomfort but no acute distress; cooperative with exam Filed Vitals:  08/03/12 2131 08/03/12 2200 08/03/12 2230 08/04/12 0025 BP: 156/90 157/89 157/89 158/98 Pulse: 80 85 82 86 Temp: 98.6 F (37 C)    TempSrc: Oral    Resp: 14 11 13 20  SpO2: 98% 93% 96% 97%  Blood pressure 158/98, pulse 86, temperature 98.6 F (37 C), temperature source Oral, resp. rate 20, SpO2 97.00%. PSYCH: He is alert and oriented x4; does not appear anxious does not appear depressed; affect is normal HEENT: Normocephalic and Atraumatic, Mucous membranes pink; PERRLA; EOM intact; Fundi:  Benign;  No scleral icterus, Nares: Patent, Oropharynx: Clear, Fair Dentition, Neck:  FROM, no cervical lymphadenopathy nor thyromegaly or carotid bruit; no JVD; Breasts:: Not examined CHEST WALL: No tenderness CHEST: Normal respiration, clear to auscultation bilaterally HEART: Regular rate and rhythm; no murmurs rubs or gallops BACK: No kyphosis or scoliosis; no CVA tenderness ABDOMEN: Positive Bowel Sounds, Obese, soft non-tender; no masses, no organomegaly, no pannus; no intertriginous candida. Rectal Exam: Not  done EXTREMITIES: No bone or joint deformity; age-appropriate arthropathy of the hands and knees; no cyanosis, clubbing or edema; no ulcerations. Genitalia: not examined PULSES: 2+ and symmetric SKIN: Normal hydration no rash or ulceration CNS: Cranial nerves 2-12 grossly intact no focal neurologic deficit    Labs on Admission:  Basic Metabolic Panel:  Lab 08/03/12 1610 07/28/12 0240  NA 136 136  K 3.9 3.7  CL 101 101  CO2 26 28  GLUCOSE 161* 97  BUN 18 19  CREATININE 1.01 0.96  CALCIUM 8.9 9.3  MG -- --  PHOS -- --   Liver Function Tests: No results found for this basename: AST:5,ALT:5,ALKPHOS:5,BILITOT:5,PROT:5,ALBUMIN:5 in the last 168 hours No results found for this basename: LIPASE:5,AMYLASE:5 in the last 168 hours No results found for this basename: AMMONIA:5 in the last 168 hours CBC:  Lab 08/03/12 2139 07/28/12  0240  WBC 8.9 7.1  NEUTROABS 5.0 3.4  HGB 12.2* 13.0  HCT 36.4* 37.8*  MCV 94.1 94.3  PLT 366 385   Cardiac Enzymes:  Lab 07/28/12 0240  CKTOTAL --  CKMB --  CKMBINDEX --  TROPONINI <0.30    BNP (last 3 results)  Basename 08/03/12 2140 07/02/12 2028 01/22/12 0041  PROBNP 400.3* 146.7* 101.3   CBG: No results found for this basename: GLUCAP:5 in the last 168 hours  Radiological Exams on Admission: Dg Chest Portable 1 View  08/03/2012  *RADIOLOGY REPORT*  Clinical Data: Chest pain, shortness of breath  PORTABLE CHEST - 1 VIEW  Comparison: 07/28/2012  Findings: Lungs are essentially clear.  Possible mild right basilar atelectasis. No pleural effusion or pneumothorax.  Cardiomediastinal silhouette is within normal limits.  IMPRESSION: No evidence of acute cardiopulmonary disease.   Original Report Authenticated By: Charline Bills, M.D.     EKG: Independently reviewed. Sinus with LBBB,   Assessment: Principal Problem:  *Chest pain Active Problems:  CAD (coronary artery disease), native coronary artery  Insulin dependent type 2 diabetes  mellitus, controlled  Tobacco abuse  Hyperlipidemia    Plan:   Admit to Stepdown Bed IV NTG drip  Cycle Cardiac Enzymes Pain Control Reconcile Home Medications SSI coverage    Code Status:  FULL CODE Family Communication:  N/A Disposition Plan:  Return to Home  Time spent: 45 Minutes  Ron Parker [facility] Pager [phone]  If 7PM-7AM, please contact night-coverage [email or web address] Password [email or web address] 08/04/2012, 12:52 AM

## 2012-08-04 NOTE — Progress Notes (Signed)
Notified Dr. Butler Denmark pt. Urine output greater than 3000 over last 6 hrs. No new orders received.

## 2012-08-05 LAB — GLUCOSE, CAPILLARY: Glucose-Capillary: 129 mg/dL — ABNORMAL HIGH (ref 70–99)

## 2012-08-05 MED ORDER — GUAIFENESIN ER 600 MG PO TB12: 600.0000 mg | ORAL_TABLET | Freq: Two times a day (BID) | ORAL | Status: AC

## 2012-08-05 MED ORDER — LEVOFLOXACIN 750 MG PO TABS: 750.0000 mg | ORAL_TABLET | ORAL | Status: AC

## 2012-08-05 MED ORDER — ALBUTEROL SULFATE HFA 108 (90 BASE) MCG/ACT IN AERS: 2.0000 | INHALATION_SPRAY | Freq: Four times a day (QID) | RESPIRATORY_TRACT | Status: DC | PRN

## 2012-08-05 NOTE — Discharge Summary (Signed)
Physician Discharge Summary  TIBURCIO SHIRKEY MRN: [id number] DOB/AGE: 1966-03-10 46 y.o.  PCP: MUSE,ROCHELLE D., PA   Admit date: 08/03/2012 Discharge date: 08/05/2012  Discharge Diagnoses:     *Chest pain likely musculoskeletal in nature Active Problems:  CAD (coronary artery disease), native coronary artery  Insulin dependent type 2 diabetes mellitus, controlled  Tobacco abuse  Hyperlipidemia     Medication List     As of 08/05/2012  9:35 AM    STOP taking these medications         naproxen 500 MG tablet   Commonly known as: NAPROSYN      TAKE these medications         albuterol 108 (90 BASE) MCG/ACT inhaler   Commonly known as: PROVENTIL HFA;VENTOLIN HFA   Inhale 1-2 puffs into the lungs every 6 (six) hours as needed for wheezing.      aspirin 325 MG EC tablet   Take 325 mg by mouth every morning.      gabapentin 400 MG capsule   Commonly known as: NEURONTIN   Take 1 capsule (400 mg total) by mouth 3 (three) times daily.      gemfibrozil 600 MG tablet   Commonly known as: LOPID   Take 600 mg by mouth 2 (two) times daily before a meal.      glipiZIDE 5 MG tablet   Commonly known as: GLUCOTROL   Take 5 mg by mouth 2 (two) times daily before a meal.      guaiFENesin 600 MG 12 hr tablet   Commonly known as: MUCINEX   Take 1 tablet (600 mg total) by mouth 2 (two) times daily.      LANTUS SOLOSTAR 100 UNIT/ML injection   Generic drug: insulin glargine   Inject 54 Units into the skin at bedtime.      levofloxacin 750 MG tablet   Commonly known as: LEVAQUIN   Take 1 tablet (750 mg total) by mouth daily.      loratadine 10 MG tablet   Commonly known as: CLARITIN   Take 10 mg by mouth daily.      metFORMIN 1000 MG tablet   Commonly known as: GLUCOPHAGE   Take 1 tablet (1,000 mg total) by mouth 2 (two) times daily with a meal. HOLD for 48 hours, restart on April 18th.      metoprolol succinate 25 MG 24 hr tablet   Commonly known as: TOPROL-XL   Take 1 tablet (25 mg total) by mouth daily.      nitroGLYCERIN 0.4 MG SL tablet   Commonly known as: NITROSTAT   Place 1 tablet (0.4 mg total) under the tongue every 5 (five) minutes as needed. For chest pain.      pantoprazole 40 MG tablet   Commonly known as: PROTONIX   Take 40 mg by mouth daily.      pioglitazone 15 MG tablet   Commonly known as: ACTOS   Take 15 mg by mouth daily.      prasugrel 10 MG Tabs   Commonly known as: EFFIENT   Take 1 tablet (10 mg total) by mouth daily.      rosuvastatin 10 MG tablet   Commonly known as: CRESTOR   Take 1 tablet (10 mg total) by mouth daily.      traZODone 50 MG tablet   Commonly known as: DESYREL   Take 50 mg by mouth at bedtime as needed.        Discharge  Condition: Stable  Disposition: 01-Home or Self Care   Consults: None  Significant Diagnostic Studies: US Abdomen Limited  08/04/2012  *RADIOLOGY REPORT*  Clinical Data: Question of ascites.  LIMITED ABDOMINAL ULTRASOUND  Comparison:  08/04/2012 plain film exam.  05/08/2012 and 03/17/2012 CT.  Findings: No ascites visualized.  IMPRESSION: No ascites detected.   Original Report Authenticated By: Fuller Canada, M.D.    Dg Chest Portable 1 View  08/03/2012  *RADIOLOGY REPORT*  Clinical Data: Chest pain, shortness of breath  PORTABLE CHEST - 1 VIEW  Comparison: 07/28/2012  Findings: Lungs are essentially clear.  Possible mild right basilar atelectasis. No pleural effusion or pneumothorax.  Cardiomediastinal silhouette is within normal limits.  IMPRESSION: No evidence of acute cardiopulmonary disease.   Original Report Authenticated By: Charline Bills, M.D.    Dg Chest Port 1 View  07/28/2012  *RADIOLOGY REPORT*  Clinical Data: Shortness of breath for 2 days.  PORTABLE CHEST - 1 VIEW  Comparison: 07/02/2012  Findings: Shallow inspiration. The heart size and pulmonary vascularity are normal. The lungs appear clear and expanded without focal air space disease or  consolidation. No blunting of the costophrenic angles.  No pneumothorax.  Mediastinal contours appear intact.  No significant change since previous study.  IMPRESSION: No evidence of active pulmonary disease.   Original Report Authenticated By: Marlon Pel, M.D.    Dg Abd Portable 2v  08/04/2012  *RADIOLOGY REPORT*  Clinical Data: Abdominal pain and distension.  PORTABLE ABDOMEN - 2 VIEW  Comparison: Abdominal CT 03/17/2012.  Findings: The bowel gas pattern is normal.  There is no free intraperitoneal air.  A right pelvic phlebolith is unchanged.  No osseous abnormalities are seen.  IMPRESSION: No acute abdominal findings.   Original Report Authenticated By: Gerrianne Scale, M.D.    None  Microbiology: Recent Results (from the past 240 hour(s))  MRSA PCR SCREENING     Status: Normal   Collection Time   08/04/12  1:26 AM      Component Value Range Status Comment   MRSA by PCR NEGATIVE  NEGATIVE Final      Labs: Results for orders placed during the hospital encounter of 08/03/12 (from the past 48 hour(s))  CBC WITH DIFFERENTIAL     Status: Abnormal   Collection Time   08/03/12  9:39 PM      Component Value Range Comment   WBC 8.9  4.0 - 10.5 K/uL    RBC 3.87 (*) 4.22 - 5.81 MIL/uL    Hemoglobin 12.2 (*) 13.0 - 17.0 g/dL    HCT 16.1 (*) 09.6 - 52.0 %    MCV 94.1  78.0 - 100.0 fL    MCH 31.5  26.0 - 34.0 pg    MCHC 33.5  30.0 - 36.0 g/dL    RDW 04.5  40.9 - 81.1 %    Platelets 366  150 - 400 K/uL    Neutrophils Relative 57  43 - 77 %    Neutro Abs 5.0  1.7 - 7.7 K/uL    Lymphocytes Relative 30  12 - 46 %    Lymphs Abs 2.7  0.7 - 4.0 K/uL    Monocytes Relative 11  3 - 12 %    Monocytes Absolute 1.0  0.1 - 1.0 K/uL    Eosinophils Relative 2  0 - 5 %    Eosinophils Absolute 0.2  0.0 - 0.7 K/uL    Basophils Relative 0  0 - 1 %  Basophils Absolute 0.0  0.0 - 0.1 K/uL   BASIC METABOLIC PANEL     Status: Abnormal   Collection Time   08/03/12  9:39 PM      Component Value  Range Comment   Sodium 136  135 - 145 mEq/L    Potassium 3.9  3.5 - 5.1 mEq/L    Chloride 101  96 - 112 mEq/L    CO2 26  19 - 32 mEq/L    Glucose, Bld 161 (*) 70 - 99 mg/dL    BUN 18  6 - 23 mg/dL    Creatinine, Ser 1.61  0.50 - 1.35 mg/dL    Calcium 8.9  8.4 - 09.6 mg/dL    GFR calc non Af Amer 87 (*) >90 mL/min    GFR calc Af Amer >90  >90 mL/min   PRO B NATRIURETIC PEPTIDE     Status: Abnormal   Collection Time   08/03/12  9:40 PM      Component Value Range Comment   Pro B Natriuretic peptide (BNP) 400.3 (*) 0 - 125 pg/mL   POCT I-STAT TROPONIN I     Status: Normal   Collection Time   08/03/12  9:47 PM      Component Value Range Comment   Troponin i, poc 0.02  0.00 - 0.08 ng/mL    Comment 3            TROPONIN I     Status: Normal   Collection Time   08/04/12 12:46 AM      Component Value Range Comment   Troponin I <0.30  <0.30 ng/mL   HEMOGLOBIN A1C     Status: Abnormal   Collection Time   08/04/12 12:46 AM      Component Value Range Comment   Hemoglobin A1C 7.9 (*) <5.7 %    Mean Plasma Glucose 180 (*) <117 mg/dL   MRSA PCR SCREENING     Status: Normal   Collection Time   08/04/12  1:26 AM      Component Value Range Comment   MRSA by PCR NEGATIVE  NEGATIVE   BASIC METABOLIC PANEL     Status: Abnormal   Collection Time   08/04/12  5:00 AM      Component Value Range Comment   Sodium 137  135 - 145 mEq/L    Potassium 4.3  3.5 - 5.1 mEq/L    Chloride 99  96 - 112 mEq/L    CO2 29  19 - 32 mEq/L    Glucose, Bld 117 (*) 70 - 99 mg/dL    BUN 17  6 - 23 mg/dL    Creatinine, Ser 0.45  0.50 - 1.35 mg/dL    Calcium 9.1  8.4 - 40.9 mg/dL    GFR calc non Af Amer 83 (*) >90 mL/min    GFR calc Af Amer >90  >90 mL/min   CBC     Status: Abnormal   Collection Time   08/04/12  5:00 AM      Component Value Range Comment   WBC 8.2  4.0 - 10.5 K/uL    RBC 4.00 (*) 4.22 - 5.81 MIL/uL    Hemoglobin 12.5 (*) 13.0 - 17.0 g/dL    HCT 81.1 (*) 91.4 - 52.0 %    MCV 94.0  78.0 -  100.0 fL    MCH 31.3  26.0 - 34.0 pg    MCHC 33.2  30.0 - 36.0 g/dL    RDW 78.2  11.5 - 15.5 %    Platelets 387  150 - 400 K/uL   TROPONIN I     Status: Normal   Collection Time   08/04/12  5:00 AM      Component Value Range Comment   Troponin I <0.30  <0.30 ng/mL   TROPONIN I     Status: Normal   Collection Time   08/04/12 12:08 PM      Component Value Range Comment   Troponin I <0.30  <0.30 ng/mL   HEPATIC FUNCTION PANEL     Status: Abnormal   Collection Time   08/04/12 12:08 PM      Component Value Range Comment   Total Protein 6.9  6.0 - 8.3 g/dL    Albumin 3.6  3.5 - 5.2 g/dL    AST 18  0 - 37 U/L    ALT 15  0 - 53 U/L    Alkaline Phosphatase 37 (*) 39 - 117 U/L    Total Bilirubin 0.3  0.3 - 1.2 mg/dL    Bilirubin, Direct <1.6  0.0 - 0.3 mg/dL    Indirect Bilirubin NOT CALCULATED  0.3 - 0.9 mg/dL   GLUCOSE, CAPILLARY     Status: Abnormal   Collection Time   08/04/12  9:21 PM      Component Value Range Comment   Glucose-Capillary 138 (*) 70 - 99 mg/dL      Dustin Cruz :46 y.o. male w/ the above medical problems who presented to [facility] on 03/06[facility]12 with complaints of chest pain.  Previously admitted with complaints of chest pain on 01/22/2012 at which time he ruled out for MI. Cardiac catheterization per Dr. Riley Kill, demonstrated prox 30-40% LAD, LCx ramus w/ hazy 70-80% in-stent restenosis, EF 60%. He was elected to be treated medically because of the risks of compromising the LAD and large diagonal branch. He was continued on ASA, Effient, metoprolol, and statin. The day of presentation he reported chest pain that started while he was sitting at home that was associated with dizziness and made worse with inspiration and coughing. The pain continued for five hours prompting him to present to the ED.   HOSPITAL COURSE:  1. Chest pain/ h/o CAD  He points to the center of the chest and tells me the pain is more of a soreness- he admits that it hurts when he presses upon  the area. He tells me he has been coughing lately and the pain is also worse when he coughs. The Nitro infusion has not made it better. The pain does not radiate. His cough started a few days ago. He quit smoking as well a few days ago. Doubt current pain is cardiac pain, cardiac enzymes are negative, EKG unchanged from prior, will d/c Nitro infusion and treat with Tylenol. He will followup with Dr. Dietrich Pates given 3 admissions this year for chest pain. Cardiac enzymes were cycled and were found to be negative.  2. Acute bronchitits  Likely the cause of this chest pain, start SVN treatments, Mucinex, Flutter valve and Levaquin for another 7 days   3. Distended abdomen  Dull to precussion, pt states that it is never this distended. Ultrasound results are negative for excess gas or stool. Ultrasound is negative for ascites therefore, unfortunately, this is likely all fat...  4. DM  Cont current treatment  5. HTN-  Resume home meds.     Discharge Exam:  Blood pressure 152/86, pulse 100, temperature 97.8 F (36.6 C), temperature source Oral,  resp. rate 16, height 6\' 2"  (1.88 m), weight 104.3 kg (229 lb 15 oz), SpO2 97.00%.   PSYCH: He is alert and oriented x4; does not appear anxious does not appear depressed; affect is normal  HEENT: Normocephalic and Atraumatic, Mucous membranes pink; PERRLA; EOM intact; Fundi: Benign; No scleral icterus, Nares: Patent, Oropharynx: Clear, Fair Dentition, Neck: FROM, no cervical lymphadenopathy nor thyromegaly or carotid bruit; no JVD;  Breasts:: Not examined  CHEST WALL: No tenderness  CHEST: Normal respiration, clear to auscultation bilaterally  HEART: Regular rate and rhythm; no murmurs rubs or gallops  BACK: No kyphosis or scoliosis; no CVA tenderness  ABDOMEN: Positive Bowel Sounds, Obese, soft non-tender; no masses, no organomegaly, no pannus; no intertriginous candida.  Rectal Exam: Not done  EXTREMITIES: No bone or joint deformity; age-appropriate  arthropathy of the hands and knees; no cyanosis, clubbing or edema; no ulcerations.  Genitalia: not examined  PULSES: 2+ and symmetric  SKIN: Normal hydration no rash or ulceration  CNS: Cranial nerves 2-12 grossly intact no focal neurologic deficit         Follow-up Information    Follow up with MUSE,ROCHELLE D., PA. Schedule an appointment as soon as possible for a visit in 1 week.   Contact information:   [address] [place] [address] [phone]       Follow up with Riverside Bing, MD. Schedule an appointment as soon as possible for a visit in 2 weeks. (Posthospitalization followup for chest pain)    Contact information:   [address] [place] [address] [phone]          Signed: Richarda Overlie 08/05/2012, 9:35 AM

## 2012-08-07 LAB — GLUCOSE, CAPILLARY
Glucose-Capillary: 108 mg/dL — ABNORMAL HIGH (ref 70–99)
Glucose-Capillary: 135 mg/dL — ABNORMAL HIGH (ref 70–99)
Glucose-Capillary: 183 mg/dL — ABNORMAL HIGH (ref 70–99)

## 2012-08-09 ENCOUNTER — Encounter (HOSPITAL_COMMUNITY): Payer: Self-pay | Admitting: Emergency Medicine

## 2012-08-09 ENCOUNTER — Emergency Department (HOSPITAL_COMMUNITY)
Admission: EM | Admit: 2012-08-09 | Discharge: 2012-08-10 | Disposition: A | Discharge status: Other Acute Inpatient Hospital | Payer: Medicaid Other | Attending: Emergency Medicine | Admitting: Emergency Medicine

## 2012-08-09 DIAGNOSIS — F3289 Other specified depressive episodes: Secondary | ICD-10-CM | POA: Insufficient documentation

## 2012-08-09 DIAGNOSIS — F329 Major depressive disorder, single episode, unspecified: Secondary | ICD-10-CM

## 2012-08-09 DIAGNOSIS — F17200 Nicotine dependence, unspecified, uncomplicated: Secondary | ICD-10-CM | POA: Insufficient documentation

## 2012-08-09 DIAGNOSIS — E669 Obesity, unspecified: Secondary | ICD-10-CM | POA: Insufficient documentation

## 2012-08-09 DIAGNOSIS — E785 Hyperlipidemia, unspecified: Secondary | ICD-10-CM | POA: Insufficient documentation

## 2012-08-09 DIAGNOSIS — Z8529 Personal history of malignant neoplasm of other respiratory and intrathoracic organs: Secondary | ICD-10-CM | POA: Insufficient documentation

## 2012-08-09 DIAGNOSIS — R45851 Suicidal ideations: Secondary | ICD-10-CM

## 2012-08-09 DIAGNOSIS — K219 Gastro-esophageal reflux disease without esophagitis: Secondary | ICD-10-CM | POA: Insufficient documentation

## 2012-08-09 DIAGNOSIS — I252 Old myocardial infarction: Secondary | ICD-10-CM | POA: Insufficient documentation

## 2012-08-09 DIAGNOSIS — E119 Type 2 diabetes mellitus without complications: Secondary | ICD-10-CM | POA: Insufficient documentation

## 2012-08-09 DIAGNOSIS — Z794 Long term (current) use of insulin: Secondary | ICD-10-CM | POA: Insufficient documentation

## 2012-08-09 DIAGNOSIS — I2510 Atherosclerotic heart disease of native coronary artery without angina pectoris: Secondary | ICD-10-CM | POA: Insufficient documentation

## 2012-08-09 DIAGNOSIS — Z79899 Other long term (current) drug therapy: Secondary | ICD-10-CM | POA: Insufficient documentation

## 2012-08-09 DIAGNOSIS — F411 Generalized anxiety disorder: Secondary | ICD-10-CM | POA: Insufficient documentation

## 2012-08-09 DIAGNOSIS — Z7982 Long term (current) use of aspirin: Secondary | ICD-10-CM | POA: Insufficient documentation

## 2012-08-09 DIAGNOSIS — I10 Essential (primary) hypertension: Secondary | ICD-10-CM | POA: Insufficient documentation

## 2012-08-09 LAB — CBC WITH DIFFERENTIAL/PLATELET
Basophils Absolute: 0 10*3/uL (ref 0.0–0.1)
Basophils Relative: 0 % (ref 0–1)
Eosinophils Relative: 2 % (ref 0–5)
HCT: 44.1 % (ref 39.0–52.0)
MCHC: 34.2 g/dL (ref 30.0–36.0)
MCV: 93.2 fL (ref 78.0–100.0)
Monocytes Absolute: 0.8 10*3/uL (ref 0.1–1.0)
RDW: 12.7 % (ref 11.5–15.5)

## 2012-08-09 LAB — BASIC METABOLIC PANEL
Calcium: 9.8 mg/dL (ref 8.4–10.5)
Creatinine, Ser: 1.1 mg/dL (ref 0.50–1.35)
GFR calc Af Amer: >90 mL/min (ref >90)

## 2012-08-09 LAB — RAPID URINE DRUG SCREEN, HOSP PERFORMED
Opiates: NOT DETECTED
Tetrahydrocannabinol: NOT DETECTED

## 2012-08-09 LAB — GLUCOSE, CAPILLARY

## 2012-08-09 MED ORDER — IBUPROFEN 400 MG PO TABS: 400.0000 mg | ORAL_TABLET | Freq: Three times a day (TID) | ORAL | Status: DC | PRN

## 2012-08-09 MED ORDER — NICOTINE 21 MG/24HR TD PT24: 21.0000 mg | MEDICATED_PATCH | Freq: Every day | TRANSDERMAL | Status: DC | PRN

## 2012-08-09 MED ORDER — ALUM & MAG HYDROXIDE-SIMETH 200-200-20 MG/5ML PO SUSP: 30.0000 mL | ORAL | Status: DC | PRN

## 2012-08-09 MED ORDER — ONDANSETRON HCL 4 MG PO TABS: 4.0000 mg | ORAL_TABLET | Freq: Three times a day (TID) | ORAL | Status: DC | PRN

## 2012-08-09 MED ORDER — ASPIRIN EC 325 MG PO TBEC
325.0000 mg | DELAYED_RELEASE_TABLET | Freq: Every morning | ORAL | Status: DC
  Administered 2012-08-10: 325 mg via ORAL
  Filled 2012-08-09: qty 1

## 2012-08-09 MED ORDER — PRASUGREL HCL 10 MG PO TABS
10.0000 mg | ORAL_TABLET | Freq: Every day | ORAL | Status: DC
  Administered 2012-08-10: 10 mg via ORAL
  Filled 2012-08-09: qty 1

## 2012-08-09 MED ORDER — ACETAMINOPHEN 325 MG PO TABS
650.0000 mg | ORAL_TABLET | ORAL | Status: DC | PRN
  Administered 2012-08-09: 650 mg via ORAL
  Filled 2012-08-09: qty 2

## 2012-08-09 MED ORDER — LORAZEPAM 1 MG PO TABS
1.0000 mg | ORAL_TABLET | Freq: Three times a day (TID) | ORAL | Status: DC | PRN
  Administered 2012-08-10: 1 mg via ORAL
  Filled 2012-08-09: qty 1

## 2012-08-09 MED ORDER — PANTOPRAZOLE SODIUM 40 MG PO TBEC
40.0000 mg | DELAYED_RELEASE_TABLET | Freq: Every day | ORAL | Status: DC
  Administered 2012-08-10: 40 mg via ORAL
  Filled 2012-08-09: qty 1

## 2012-08-09 MED ORDER — TRAZODONE HCL 50 MG PO TABS
50.0000 mg | ORAL_TABLET | Freq: Every evening | ORAL | Status: DC | PRN
  Administered 2012-08-10: 50 mg via ORAL
  Filled 2012-08-09 (×3): qty 1

## 2012-08-09 MED ORDER — ZOLPIDEM TARTRATE 5 MG PO TABS
5.0000 mg | ORAL_TABLET | Freq: Every evening | ORAL | Status: DC | PRN
  Administered 2012-08-10: 5 mg via ORAL
  Filled 2012-08-09: qty 1

## 2012-08-09 MED ORDER — LEVOFLOXACIN 500 MG PO TABS
750.0000 mg | ORAL_TABLET | ORAL | Status: DC
  Administered 2012-08-10: 750 mg via ORAL

## 2012-08-09 MED ORDER — GLIPIZIDE 5 MG PO TABS
5.0000 mg | ORAL_TABLET | Freq: Two times a day (BID) | ORAL | Status: DC
  Administered 2012-08-10 (×3): 5 mg via ORAL
  Filled 2012-08-09 (×7): qty 1

## 2012-08-09 MED ORDER — GABAPENTIN 400 MG PO CAPS
400.0000 mg | ORAL_CAPSULE | Freq: Three times a day (TID) | ORAL | Status: DC
  Administered 2012-08-10 (×3): 400 mg via ORAL
  Filled 2012-08-09 (×3): qty 1

## 2012-08-09 MED ORDER — METOPROLOL SUCCINATE ER 25 MG PO TB24
25.0000 mg | ORAL_TABLET | Freq: Every day | ORAL | Status: DC
  Administered 2012-08-10: 25 mg via ORAL
  Filled 2012-08-09 (×4): qty 1

## 2012-08-09 NOTE — ED Provider Notes (Signed)
History     CSN: [id number]  Arrival date & time 08/09/12  1341   First MD Initiated Contact with Patient 08/09/12 1434      Chief Complaint  Patient presents with  . Suicidal     HPI Pt was seen at 1535.  Per pt, c/o gradual onset and worsening of persistent depression and SI for the past 2 week, worse this morning.  Pt states after taking his usual meds this morning he "grabbed another handful of them" with the intent to overdose on them.  Pt states he sat on his porch with "the pills in one hand and my phone in the other."  States he did not take the pills, but instead called his caseworker.  Pt then was sent to [facility], who referred him to the ED for further eval.  States he has been depressed regarding multiple recent deaths in his family. Denies HI.    Past Medical History  Diagnosis Date  . Diabetes mellitus     Type 2  . Hyperlipidemia   . Depression   . Coronary artery disease     s/p BMS to Ramus 10/2010;  Cath 01/22/12 prox 30-40% LAD, LCx ramus w/ hazy 70-80% in-stent restenosis, EF 60% treated medically  . HTN (hypertension)   . Tobacco abuse   . Obesity   . Anxiety   . Melanoma 2007    surgery at [facility]  . Myocardial infarction 2012  . GERD (gastroesophageal reflux disease)   . Hyperlipidemia     Past Surgical History  Procedure Date  . Melanoma surgery 2007    [facility], removed lymph nodes under arm as well  . Cardiac catheterization     with stent  . Esophagogastroduodenoscopy 04/24/2012    Procedure: ESOPHAGOGASTRODUODENOSCOPY (EGD);  Surgeon: Corbin Ade, MD;  Location: [facility];  Service: Endoscopy;  Laterality: N/A;    Family History  Problem Relation Age of Onset  . Lung cancer    . Heart disease    . Other      not real familiar with family history    History  Substance Use Topics  . Smoking status: Current Every Day Smoker -- 1.0 packs/day for 30 years    Types: Cigarettes  . Smokeless tobacco: Never Used  . 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 ; ; Eyes: Negative for eye pain, redness and discharge. ; ; ENMT: Negative for ear pain, hoarseness, nasal congestion, sinus pressure and sore throat. ; ; Cardiovascular: Negative for chest pain, palpitations, diaphoresis, dyspnea and peripheral edema. ; ; Respiratory: Negative for cough, wheezing and stridor. ; ; Gastrointestinal: Negative for nausea, vomiting, diarrhea, abdominal pain, blood in stool, hematemesis, jaundice and rectal bleeding. . ; ; Genitourinary: Negative for dysuria, flank pain and hematuria. ; ; Musculoskeletal: Negative for back pain and neck pain. Negative for swelling and trauma.; ; Skin: Negative for pruritus, rash, abrasions, blisters, bruising and skin lesion.; ; Neuro: Negative for headache, lightheadedness and neck stiffness. Negative for weakness, altered level of consciousness , altered mental status, extremity weakness, paresthesias, involuntary movement, seizure and syncope.; Psych:  +depression, +SI.  No HI, no hallucinations.        Allergies  Review of patient's allergies indicates no known allergies.  Home Medications   Current Outpatient Rx  Name Route Sig Dispense  Refill  . ALBUTEROL SULFATE HFA 108 (90 BASE) MCG/ACT IN AERS Inhalation Inhale 2 puffs into the lungs every 6 (six) hours as needed for wheezing. 1 Inhaler 2  . ASPIRIN 325 MG PO TBEC Oral Take 325 mg by mouth every morning.     [name] GABAPENTIN 400 MG PO CAPS Oral Take 1 capsule (400 mg total) by mouth 3 (three) times daily. 90 capsule 3  . GEMFIBROZIL 600 MG PO TABS Oral Take 600 mg by mouth 2 (two) times daily before a meal.      . GLIPIZIDE 5 MG PO TABS Oral Take 5 mg by mouth 2 (two) times daily before a meal.    . GUAIFENESIN ER 600 MG PO TB12 Oral Take 1 tablet (600 mg total) by mouth 2 (two) times daily. 20  tablet 0  . INSULIN GLARGINE 100 UNIT/ML [place] SOLN Subcutaneous Inject 54 Units into the skin at bedtime.     [name] LEVOFLOXACIN 750 MG PO TABS Oral Take 1 tablet (750 mg total) by mouth daily. 7 tablet 0  . LORATADINE 10 MG PO TABS Oral Take 10 mg by mouth daily.    [name] METFORMIN HCL 1000 MG PO TABS Oral Take 1 tablet (1,000 mg total) by mouth 2 (two) times daily with a meal. HOLD for 48 hours, restart on April 18th.    . METOPROLOL SUCCINATE ER 25 MG PO TB24 Oral Take 1 tablet (25 mg total) by mouth daily. 30 tablet 12  . PANTOPRAZOLE SODIUM 40 MG PO TBEC Oral Take 40 mg by mouth daily.    [name] PIOGLITAZONE HCL 15 MG PO TABS Oral Take 15 mg by mouth daily.    [name] PRASUGREL HCL 10 MG PO TABS Oral Take 1 tablet (10 mg total) by mouth daily. 30 tablet 12  . ROSUVASTATIN CALCIUM 10 MG PO TABS Oral Take 1 tablet (10 mg total) by mouth daily. 30 tablet 3  . TRAZODONE HCL 50 MG PO TABS Oral Take 50 mg by mouth at bedtime as needed.     [name] NITROGLYCERIN 0.4 MG SL SUBL Sublingual Place 1 tablet (0.4 mg total) under the tongue every 5 (five) minutes as needed. For chest pain. 25 tablet 3    BP 162/99  Pulse 100  Temp 98.1 F (36.7 C) (Oral)  Resp 18  Ht 6\' 2"  (1.88 m)  Wt 224 lb (101.606 kg)  BMI 28.76 kg/m2  SpO2 100%  Physical Exam 1540: Physical examination:  Nursing notes reviewed; Vital signs and O2 SAT reviewed;  Constitutional: Well developed, Well nourished, Well hydrated, In no acute distress; Head:  Normocephalic, atraumatic; Eyes: EOMI, PERRL, No scleral icterus; ENMT: Mouth and pharynx normal, Mucous membranes moist; Neck: Supple, Full range of motion, No lymphadenopathy; Cardiovascular: Regular rate and rhythm, No murmur, rub, or gallop; Respiratory: Breath sounds clear & equal bilaterally, No rales, rhonchi, wheezes.  Speaking full sentences with ease, Normal respiratory effort/excursion; Chest: Nontender, Movement normal; Extremities: Pulses normal, No tenderness, No edema, No calf edema or  asymmetry.; Neuro: AA&Ox3, Major CN grossly intact.  Speech clear. No gross focal motor or sensory deficits in extremities.; Skin: Color normal, Warm, Dry.; Psych:  Affect flat, poor eye contact. +depression, +SI.    ED Course  Procedures    MDM  MDM Reviewed: nursing note, vitals and previous chart Interpretation: labs   Results for orders placed during the hospital encounter of 08/09/12  CBC WITH DIFFERENTIAL      Component Value Range   WBC 7.5  4.0 - 10.5 K/uL   RBC 4.73  4.22 - 5.81 MIL/uL   Hemoglobin 15.1  13.0 - 17.0 g/dL   HCT 45.4  09.8 - 11.9 %   MCV 93.2  78.0 - 100.0 fL   MCH 31.9  26.0 - 34.0 pg   MCHC 34.2  30.0 - 36.0 g/dL   RDW 14.7  82.9 - 56.2 %   Platelets 462 (*) 150 - 400 K/uL   Neutrophils Relative 52  43 - 77 %   Neutro Abs 3.9  1.7 - 7.7 K/uL   Lymphocytes Relative 36  12 - 46 %   Lymphs Abs 2.7  0.7 - 4.0 K/uL   Monocytes Relative 10  3 - 12 %   Monocytes Absolute 0.8  0.1 - 1.0 K/uL   Eosinophils Relative 2  0 - 5 %   Eosinophils Absolute 0.1  0.0 - 0.7 K/uL   Basophils Relative 0  0 - 1 %   Basophils Absolute 0.0  0.0 - 0.1 K/uL  BASIC METABOLIC PANEL      Component Value Range   Sodium 134 (*) 135 - 145 mEq/L   Potassium 4.2  3.5 - 5.1 mEq/L   Chloride 96  96 - 112 mEq/L   CO2 28  19 - 32 mEq/L   Glucose, Bld 128 (*) 70 - 99 mg/dL   BUN 20  6 - 23 mg/dL   Creatinine, Ser 1.30  0.50 - 1.35 mg/dL   Calcium 9.8  8.4 - 86.5 mg/dL   GFR calc non Af Amer 78 (*) >90 mL/min   GFR calc Af Amer >90  >90 mL/min  URINE RAPID DRUG SCREEN (HOSP PERFORMED)      Component Value Range   Opiates NONE DETECTED  NONE DETECTED   Cocaine NONE DETECTED  NONE DETECTED   Benzodiazepines POSITIVE (*) NONE DETECTED   Amphetamines NONE DETECTED  NONE DETECTED   Tetrahydrocannabinol NONE DETECTED  NONE DETECTED   Barbiturates NONE DETECTED  NONE DETECTED  ETHANOL      Component Value Range   Alcohol, Ethyl (B) <11  0 - 11 mg/dL    7846:  ACT Ella will  eval.   2100:  Pending dispo. Holding orders written.         Laray Anger, DO 08/10/12 0101

## 2012-08-09 NOTE — BH Assessment (Signed)
Assessment Note   Dustin Cruz is an 46 y.o. male. PT WAS REFERRED TO THE ED BY [facility] AFTER STATING HE WAS SUICIDAL AND HAD ATTEMPTED TO OVERDOSE ON HIS PRESCRIBED MEDICATIONS.  PT REPORTS HE DID NOT TAKE THE HAND FULL OF PILLS BUT CALLED FOR HELP INSTEAD.  HE REPORTS RECENT  DEATHS IN THE FAMILY, ONE WHICH OCCURRED TODAY AND CONFLICT WITH HIS WIFE OF 8 YEARS.  PT REPORTS THIS IS HIS SECOND MARRIAGE AND HIS FIRST WIFE WAS ALWAYS ACCUSING HIM OF OTHER WOMEN AND NOW THIS WIFE IS DOING THE SAME.  HE REPORTS SHE CARED FOR HIM WHILE HE WAS GOING THROUGH THE DISABILITY WAIT AND NOW SHE IS TRYING TO GET DISABILITY BUT HE IS UNABLE TO TOLERATE HER BEHAVIOR AND TODAY FELT HE WOULD BE BETTER OFF DEAD.  HE REPORTS HE'S TRYING TO BE SUPPORTIVE AND IF HE PUTS HER OUT SHE WOULD HAVE NOTHING BUT SHE IS DIFFICULT TO LIVE WITH.  HE HAS OTHER STRESSORS ION HIM ALSO THAT HE DID NOT WANT TO TALK ABOUT.  PT REPORTS GOING TO [facility]  FOR DEPRESSION AND ANXIETY.  HE REPORTS WHEN HE'S NOT AROUND HE HAS NO MEDICAL PROBLEMS.  HE FEELS HE HAS TO GET AWAY FROM HER OR HE DOES NOT KNOW WHAT HE WOULD END UP DOING TO HIMSELF.  PT IS UNABLE TO CONTRACT FOR SAFETY.  NO H/I AND NO PSYCHOSIS.  HE JUST RECEIVED HIS 2 YR CHIP FROM [facility] June 11, 2012.     Axis I: Major Depression, single episode Axis II: Deferred Axis III:  Past Medical History  Diagnosis Date  . Diabetes mellitus     Type 2  . Hyperlipidemia   . Depression   . Coronary artery disease     s/p BMS to Ramus 10/2010;  Cath 01/22/12 prox 30-40% LAD, LCx ramus w/ hazy 70-80% in-stent restenosis, EF 60% treated medically  . HTN (hypertension)   . Tobacco abuse   . Obesity   . Anxiety   . Melanoma 2007    [facility] at [facility]  . Myocardial infarction 2012  . GERD (gastroesophageal reflux disease)   . Hyperlipidemia    Axis IV: economic problems, problems related to social environment and problems with primary support group Axis V: 31-40 impairment in reality  testing        Past Medical History:  Past Medical History  Diagnosis Date  . Diabetes mellitus     Type 2  . Hyperlipidemia   . Depression   . Coronary artery disease     s/p BMS to Ramus 10/2010;  Cath 01/22/12 prox 30-40% LAD, LCx ramus w/ hazy 70-80% in-stent restenosis, EF 60% treated medically  . HTN (hypertension)   . Tobacco abuse   . Obesity   . Anxiety   . Melanoma 2007    surgery at [facility]  . Myocardial infarction 2012  . GERD (gastroesophageal reflux disease)   . Hyperlipidemia     Past Surgical History  Procedure Date  . Melanoma surgery 2007    [facility], removed lymph nodes under arm as well  . Cardiac catheterization     with stent  . Esophagogastroduodenoscopy 04/24/2012    Procedure: ESOPHAGOGASTRODUODENOSCOPY (EGD);  Surgeon: Corbin Ade, MD;  Location: [facility];  Service: Endoscopy;  Laterality: N/A;    Family History:  Family History  Problem Relation Age of Onset  . Lung cancer    . Heart disease    . Other      not  real familiar with family history    Social History:  reports that he has been smoking Cigarettes.  He has a 30 Brigandi-year smoking history. He has never used smokeless tobacco. He reports that he does not drink alcohol or use illicit drugs.  Additional Social History:  Alcohol / Drug Use Pain Medications: NA Prescriptions: NA Over the Counter: NA History of alcohol / drug use?: Yes Substance #1 Name of Substance 1: ALCOHOL-BEER 1 - Age of First Use: 20 1 - Amount (size/oz): 2 CASES AND LIQUOR  1 - Frequency: DAILY 1 - Duration: 10 1 - Last Use / Amount: 06/11/2010 Substance #2 Name of Substance 2: CRACK/COCAINE 2 - Age of First Use: 20 2 - Amount (size/oz): 1/2 TO 1 EIGH BALL 2 - Frequency: DAILY 2 - Duration: 10 YRS 2 - Last Use / Amount: 06/11/2010 Substance #3 Name of Substance 3: MARIJUANA 3 - Age of First Use: 20 3 - Amount (size/oz): JOINT 3 - Frequency: DAILY 3 - Duration: 20 YRS 3 - Last Use / Amount:  06/11/10  CIWA: CIWA-Ar BP: 142/80 mmHg Pulse Rate: 91  COWS:    Allergies: No Known Allergies  Home Medications:  (Not in a hospital admission)  OB/GYN Status:  No LMP for male patient.  General Assessment Data Location of Assessment: [facility] ACT Assessment: Yes Living Arrangements: Spouse/significant other Can pt return to current living arrangement?: Yes Admission Status: Voluntary Is patient capable of signing voluntary admission?: Yes Transfer [facility]: [facility] Referral Source: MD (DR [facility])  Education Status Contact person: RAYE Meineke-SPOUSE-[phone]  Risk to self Suicidal Ideation: Yes-Currently Present Suicidal Intent: Yes-Currently Present Is patient at risk for suicide?: Yes Suicidal Plan?: Yes-Currently Present Specify Current Suicidal Plan: TO OVERDOSE ON HIS MEDICATIONS Access to Means: Yes Specify Access to Suicidal Means: HAS ACCESS TO HIS OWN MEDICATIONS What has been your use of drugs/alcohol within the last 12 months?: BEER, COCAINE,MARIJUANA Previous Attempts/Gestures: No How many times?: 0  Other Self Harm Risks: NA Triggers for Past Attempts: None known Intentional Self Injurious Behavior: None Family Suicide History: No Recent stressful life event(s): Conflict (Comment);Loss (Comment) (CONFLICT WITH WIFE AND RECENT DEATHS IN FASMILY) Persecutory voices/beliefs?: No Depression: Yes Depression Symptoms: Despondent;Isolating;Fatigue;Loss of interest in usual pleasures;Feeling worthless/self pity;Feeling angry/irritable Substance abuse history and/or treatment for substance abuse?: Yes Suicide prevention information given to non-admitted patients: Not applicable  Risk to Others Homicidal Ideation: No Thoughts of Harm to Others: No Current Homicidal Intent: No Current Homicidal Plan: No Access to Homicidal Means: No History of harm to others?: No Assessment of Violence: None Noted Violent Behavior Description:  NA Does patient have access to weapons?: No Criminal Charges Pending?: No Does patient have a court date: No  Psychosis Hallucinations: None noted Delusions: None noted  Mental Status Report Appear/Hygiene: Improved Eye Contact: Good Motor Activity: Freedom of movement Speech: Logical/coherent;Soft Level of Consciousness: Alert Mood: Depressed;Helpless;Irritable Affect: Appropriate to circumstance;Depressed;Blunted;Irritable Anxiety Level: Minimal Thought Processes: Coherent;Relevant Judgement: Impaired Orientation: Person;Place;Time;Situation Obsessive Compulsive Thoughts/Behaviors: Minimal  Cognitive Functioning Concentration: Normal Memory: Recent Intact;Remote Intact IQ: Average Insight: Poor Impulse Control: Poor Appetite: Good Sleep: No Change Total Hours of Sleep: 8  Vegetative Symptoms: None  ADLScreening [facility] Assessment Services) Patient's cognitive ability adequate to safely complete daily activities?: Yes Patient able to express need for assistance with ADLs?: Yes Independently performs ADLs?: Yes (appropriate for developmental age)  Abuse/Neglect [facility]) Physical Abuse: Denies Verbal Abuse: Denies Sexual Abuse: Denies  Prior Inpatient Therapy Prior Inpatient  Therapy: No Prior Therapy Dates: NA Prior Therapy Facilty/Provider(s): NA Reason for Treatment: NA  Prior Outpatient Therapy Prior Outpatient Therapy: Yes Prior Therapy Dates: CURRENTLY Prior Therapy Facilty/Provider(s): [facility] Reason for Treatment: DEPRERSSION, ANXIETY  ADL Screening (condition at time of admission) Patient's cognitive ability adequate to safely complete daily activities?: Yes Patient able to express need for assistance with ADLs?: Yes Independently performs ADLs?: Yes (appropriate for developmental age) Weakness of Legs: None Weakness of Arms/Hands: None  Home Assistive Devices/Equipment Home Assistive Devices/Equipment: None  Therapy Consults (therapy consults  require a physician order) PT Evaluation Needed: No OT Evalulation Needed: No SLP Evaluation Needed: No Abuse/Neglect Assessment (Assessment to be complete while patient is alone) Physical Abuse: Denies Verbal Abuse: Denies Sexual Abuse: Denies Exploitation of patient/patient's resources: Denies Self-Neglect: Denies Values / Beliefs Cultural Requests During Hospitalization: None Spiritual Requests During Hospitalization: None Consults Spiritual Care Consult Needed: No Social Work Consult Needed: No Merchant navy officer (For Healthcare) Advance Directive: Patient does not have advance directive;Patient would not like information Pre-existing out of facility DNR order (yellow form or pink MOST form): No    Additional Information 1:1 In Past 12 Months?: No CIRT Risk: No Elopement Risk: No Does patient have medical clearance?: Yes     Disposition: REFERRED TO [facility] Disposition Disposition of Patient: Inpatient treatment program Type of inpatient treatment program: Adult  On Site Evaluation by: DR Samuel Jester  Reviewed with Physician:  DR Samuel Jester   Hattie Perch Winford 08/09/2012 8:04 PM

## 2012-08-09 NOTE — ED Notes (Signed)
FS 221

## 2012-08-09 NOTE — ED Notes (Signed)
Pt reports he is a diabetic and asked about his medicine, will make MD aware

## 2012-08-09 NOTE — ED Notes (Signed)
Pt states under a lot of stress. Felling si x 2 weeks. Had another phone call this am and was told another death in family. States " i just can't do this anymore" " I almost overdosed this am". Pt states only took his regular scheduled meds this am but wanted to overdose. Cooperative. Pleasant. Was at [facility] this morning and sent here.

## 2012-08-10 ENCOUNTER — Encounter (HOSPITAL_COMMUNITY): Payer: Self-pay | Admitting: *Deleted

## 2012-08-10 ENCOUNTER — Inpatient Hospital Stay (HOSPITAL_COMMUNITY)
Admission: RE | Admit: 2012-08-10 | Discharge: 2012-08-13 | DRG: 885 | Disposition: A | Discharge status: Home / Self Care | Payer: 59 | Source: Ambulatory Visit | Attending: Psychiatry | Admitting: Psychiatry

## 2012-08-10 DIAGNOSIS — Z9861 Coronary angioplasty status: Secondary | ICD-10-CM

## 2012-08-10 DIAGNOSIS — I2510 Atherosclerotic heart disease of native coronary artery without angina pectoris: Secondary | ICD-10-CM | POA: Diagnosis present

## 2012-08-10 DIAGNOSIS — F329 Major depressive disorder, single episode, unspecified: Principal | ICD-10-CM | POA: Diagnosis present

## 2012-08-10 DIAGNOSIS — F17200 Nicotine dependence, unspecified, uncomplicated: Secondary | ICD-10-CM | POA: Diagnosis present

## 2012-08-10 DIAGNOSIS — Z85820 Personal history of malignant melanoma of skin: Secondary | ICD-10-CM

## 2012-08-10 DIAGNOSIS — Z79899 Other long term (current) drug therapy: Secondary | ICD-10-CM

## 2012-08-10 DIAGNOSIS — I10 Essential (primary) hypertension: Secondary | ICD-10-CM | POA: Diagnosis present

## 2012-08-10 DIAGNOSIS — E669 Obesity, unspecified: Secondary | ICD-10-CM | POA: Diagnosis present

## 2012-08-10 DIAGNOSIS — E785 Hyperlipidemia, unspecified: Secondary | ICD-10-CM | POA: Diagnosis present

## 2012-08-10 DIAGNOSIS — F419 Anxiety disorder, unspecified: Secondary | ICD-10-CM

## 2012-08-10 DIAGNOSIS — I252 Old myocardial infarction: Secondary | ICD-10-CM

## 2012-08-10 DIAGNOSIS — Z794 Long term (current) use of insulin: Secondary | ICD-10-CM

## 2012-08-10 DIAGNOSIS — K219 Gastro-esophageal reflux disease without esophagitis: Secondary | ICD-10-CM | POA: Diagnosis present

## 2012-08-10 DIAGNOSIS — E119 Type 2 diabetes mellitus without complications: Secondary | ICD-10-CM | POA: Diagnosis present

## 2012-08-10 DIAGNOSIS — F70 Mild intellectual disabilities: Secondary | ICD-10-CM | POA: Diagnosis present

## 2012-08-10 HISTORY — DX: Unspecified asthma, uncomplicated: J45.909

## 2012-08-10 LAB — GLUCOSE, CAPILLARY
Glucose-Capillary: 88 mg/dL (ref 70–99)
Glucose-Capillary: 214 mg/dL — ABNORMAL HIGH (ref 70–99)

## 2012-08-10 MED ORDER — ASPIRIN EC 325 MG PO TBEC
325.0000 mg | DELAYED_RELEASE_TABLET | Freq: Every day | ORAL | Status: DC
  Administered 2012-08-11 – 2012-08-13 (×3): 325 mg via ORAL
  Filled 2012-08-10 (×5): qty 1

## 2012-08-10 MED ORDER — GLIPIZIDE 5 MG PO TABS
5.0000 mg | ORAL_TABLET | Freq: Two times a day (BID) | ORAL | Status: DC
  Administered 2012-08-11 – 2012-08-13 (×5): 5 mg via ORAL
  Filled 2012-08-10 (×10): qty 1

## 2012-08-10 MED ORDER — LEVOFLOXACIN 500 MG PO TABS
ORAL_TABLET | ORAL | Status: AC
  Filled 2012-08-10: qty 1

## 2012-08-10 MED ORDER — ACETAMINOPHEN 325 MG PO TABS
650.0000 mg | ORAL_TABLET | Freq: Four times a day (QID) | ORAL | Status: DC | PRN
  Administered 2012-08-12: 650 mg via ORAL

## 2012-08-10 MED ORDER — GABAPENTIN 400 MG PO CAPS
400.0000 mg | ORAL_CAPSULE | Freq: Three times a day (TID) | ORAL | Status: DC
  Administered 2012-08-10 – 2012-08-13 (×9): 400 mg via ORAL
  Filled 2012-08-10 (×13): qty 1

## 2012-08-10 MED ORDER — GABAPENTIN 400 MG PO CAPS
400.0000 mg | ORAL_CAPSULE | Freq: Three times a day (TID) | ORAL | Status: DC
  Filled 2012-08-10 (×2): qty 1

## 2012-08-10 MED ORDER — METOPROLOL SUCCINATE ER 25 MG PO TB24
25.0000 mg | ORAL_TABLET | Freq: Every day | ORAL | Status: DC
  Administered 2012-08-11 – 2012-08-13 (×3): 25 mg via ORAL
  Filled 2012-08-10 (×5): qty 1

## 2012-08-10 MED ORDER — LEVOFLOXACIN 750 MG PO TABS
750.0000 mg | ORAL_TABLET | Freq: Every day | ORAL | Status: DC
  Administered 2012-08-11 – 2012-08-13 (×3): 750 mg via ORAL
  Filled 2012-08-10 (×4): qty 1

## 2012-08-10 MED ORDER — MAGNESIUM HYDROXIDE 400 MG/5ML PO SUSP: 30.0000 mL | Freq: Every day | ORAL | Status: DC | PRN

## 2012-08-10 MED ORDER — PANTOPRAZOLE SODIUM 40 MG PO TBEC
40.0000 mg | DELAYED_RELEASE_TABLET | Freq: Every day | ORAL | Status: DC
  Administered 2012-08-11 – 2012-08-13 (×3): 40 mg via ORAL
  Filled 2012-08-10 (×5): qty 1

## 2012-08-10 MED ORDER — LEVOFLOXACIN 500 MG PO TABS: 750.0000 mg | ORAL_TABLET | ORAL | Status: DC

## 2012-08-10 MED ORDER — LORAZEPAM 1 MG PO TABS
1.0000 mg | ORAL_TABLET | Freq: Three times a day (TID) | ORAL | Status: DC | PRN
  Administered 2012-08-10: 1 mg via ORAL
  Filled 2012-08-10: qty 1

## 2012-08-10 MED ORDER — INSULIN GLARGINE 100 UNIT/ML ~~LOC~~ SOLN
54.0000 [IU] | Freq: Every day | SUBCUTANEOUS | Status: DC
  Administered 2012-08-10 – 2012-08-12 (×3): 54 [IU] via SUBCUTANEOUS

## 2012-08-10 MED ORDER — PRASUGREL HCL 10 MG PO TABS
10.0000 mg | ORAL_TABLET | Freq: Every day | ORAL | Status: DC
  Administered 2012-08-11 – 2012-08-13 (×3): 10 mg via ORAL
  Filled 2012-08-10 (×5): qty 1

## 2012-08-10 MED ORDER — TRAZODONE HCL 50 MG PO TABS
50.0000 mg | ORAL_TABLET | Freq: Every evening | ORAL | Status: DC | PRN
  Administered 2012-08-10: 50 mg via ORAL
  Filled 2012-08-10: qty 1

## 2012-08-10 MED ORDER — ALUM & MAG HYDROXIDE-SIMETH 200-200-20 MG/5ML PO SUSP: 30.0000 mL | ORAL | Status: DC | PRN

## 2012-08-10 MED ORDER — LEVOFLOXACIN 250 MG PO TABS
ORAL_TABLET | ORAL | Status: AC
  Filled 2012-08-10: qty 1

## 2012-08-10 NOTE — Tx Team (Signed)
Initial Interdisciplinary Treatment Plan  PATIENT STRENGTHS: (choose at least two) Ability for insight Communication skills Financial means General fund of knowledge Motivation for treatment/growth Supportive family/friends  PATIENT STRESSORS: Health problems Substance abuse   PROBLEM LIST: Problem List/Patient Goals Date to be addressed Date deferred Reason deferred Estimated date of resolution  depression      Suicide risk                                                 DISCHARGE CRITERIA:  Ability to meet basic life and health needs Adequate post-discharge living arrangements Improved stabilization in mood, thinking, and/or behavior Reduction of life-threatening or endangering symptoms to within safe limits Verbal commitment to aftercare and medication compliance  PRELIMINARY DISCHARGE PLAN: Attend 12-step recovery group Participate in family therapy Return to previous living arrangement  PATIENT/FAMIILY INVOLVEMENT: This treatment plan has been presented to and reviewed with the patient, Dustin Cruz, and/or family member,   The patient and family have been given the opportunity to ask questions and make suggestions.  Marchia Bond 08/10/2012, 7:21 PM

## 2012-08-10 NOTE — ED Notes (Signed)
Dustin Cruz with ACT team called and pt has been accepted at [facility] and would like for RN here to call for information about acceptance. Number given to call [facility] for admission details. AC not available at present moment and will return call.

## 2012-08-10 NOTE — ED Notes (Signed)
Walked patient to restroom.

## 2012-08-10 NOTE — Tx Team (Signed)
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 Had some deaths in the family recently. Pt. Lives with his wife & is on disability. Pt has several medical problems which include=MI,CAD,STENT,HTN,& IDDM. Pt smoKes 1 PPD VSS. NKA.Pt reports that he has a problem with reading & writing.Pt reports that he has had cancer ,but was vague as to what type & where. Pt does have 3 large scars on his left side.Pt. was pleasant & cooperative during the admission process.A:Pt searched--no contraband found. Pt on 15 minute checks. Supported & encouraged.Oriented to room & unit. Report given to Langley Gauss RN.R: Pt safety maintained.

## 2012-08-10 NOTE — Progress Notes (Signed)
Report received from L.Bowman RN, Clinical research associate entered patients room and observed him lying in bed awake.patient has received his scheduled hs meds, voiced no complaints.Patient reports that he is trying  To fall asleep. Writer adjusted the head of patients bed, offered suppport, safety maintained on unit, will continue to monitor.

## 2012-08-10 NOTE — ED Notes (Signed)
Pharmacy called for Glipizide. Per Dustin Cruz- " we will get it to you"

## 2012-08-10 NOTE — Progress Notes (Signed)
D.  Pt pleasant on approach, stated that he takes his Lantus at hour of sleep, blood sugar 214.  No complaints voiced.  States he needs medication to help him sleep "knock me out".  Positive for evening group, see group notes.  Interacting appropriately within milieu.  Passive SI but contracts for safety on the unit.  Denies HI/hallucinations at this time.  A.  Support and encouragement offered.  Order received for Lantus.  R.  No acute distress noted, will continue to monitor.

## 2012-08-10 NOTE — ED Notes (Signed)
Waiting for Glipizide and Toprol from pharmacy.

## 2012-08-10 NOTE — ED Notes (Signed)
Patient watching TV at this time  

## 2012-08-10 NOTE — ED Notes (Signed)
Waiting for transportation from [facility] to [facility]

## 2012-08-10 NOTE — ED Notes (Signed)
Patient given Diet Coke. Asking for meds,

## 2012-08-10 NOTE — ED Notes (Signed)
Spoke with Dustin Cruz at [facility]- Nurse will have to return call for report when available. [facility] on way to transport pt. Pt given crackers and diet coke while waiting.

## 2012-08-10 NOTE — Progress Notes (Signed)
8:22 AM Resting peacefully.  Awaiting placement.

## 2012-08-10 NOTE — Progress Notes (Signed)
1:44 PM Pt accepted for transfer to [facility] by Day[facility] Ramus, N.P.

## 2012-08-10 NOTE — Progress Notes (Signed)
[facility] Group Notes:  (Counselor/Nursing/MHT/Case Management/Adjunct)  08/10/2012 10:27 PM  Type of Therapy:  Psychoeducational Skills  Participation Level:  Minimal  Participation Quality:  Inattentive  Affect:  Depressed  Cognitive:  Appropriate  Insight:  None  Engagement in Group:  Limited  Engagement in Therapy:  Limited  Modes of Intervention:  Education  Summary of Progress/Problems: The patient entered the dayroom near the end of group. He did not have anything to share nor did he set a goal for tomorrow.    Hazle Coca S 08/10/2012, 10:27 PM

## 2012-08-10 NOTE — ED Notes (Signed)
Spoke with AC at [facility] and room assignment and MD obtained for transfer. Dr. Davidson made aware.  

## 2012-08-10 NOTE — ED Notes (Signed)
[facility] called  For transport to [facility].

## 2012-08-11 DIAGNOSIS — F332 Major depressive disorder, recurrent severe without psychotic features: Secondary | ICD-10-CM

## 2012-08-11 MED ORDER — ALBUTEROL SULFATE HFA 108 (90 BASE) MCG/ACT IN AERS
2.0000 | INHALATION_SPRAY | Freq: Four times a day (QID) | RESPIRATORY_TRACT | Status: DC | PRN
Start: 2012-08-11 — End: 2012-08-13

## 2012-08-11 MED ORDER — TRAZODONE HCL 50 MG PO TABS
50.0000 mg | ORAL_TABLET | Freq: Every evening | ORAL | Status: DC | PRN
  Administered 2012-08-11 – 2012-08-12 (×3): 50 mg via ORAL
  Filled 2012-08-11 (×10): qty 1

## 2012-08-11 MED ORDER — MOMETASONE FURO-FORMOTEROL FUM 100-5 MCG/ACT IN AERO
2.0000 | INHALATION_SPRAY | Freq: Two times a day (BID) | RESPIRATORY_TRACT | Status: DC
Start: 2012-08-12 — End: 2012-08-13
  Administered 2012-08-12 – 2012-08-13 (×3): 2 via RESPIRATORY_TRACT
  Filled 2012-08-11: qty 8.8

## 2012-08-11 MED ORDER — SERTRALINE HCL 25 MG PO TABS
25.0000 mg | ORAL_TABLET | Freq: Every day | ORAL | Status: DC
  Administered 2012-08-11 – 2012-08-13 (×3): 25 mg via ORAL
  Filled 2012-08-11 (×5): qty 1

## 2012-08-11 NOTE — Progress Notes (Signed)
Psychoeducational Group Note  Date:  08/11/2012 Time: 1015 Group Topic/Focus:  Making Healthy Choices:   The focus of this group is to help patients identify negative/unhealthy choices they were using prior to admission and identify positive/healthier coping strategies to replace them upon discharge.  Participation Level:  Active  Participation Quality:  Appropriate  Affect:  Appropriate  Cognitive:  Alert  Insight:  Good  Engagement in Group:  Good  Additional Comments:    [name] 12:44 PM. 08/11/2012

## 2012-08-11 NOTE — Clinical Social Work Psych Note (Signed)
[facility] Group Notes:  (Counselor/Nursing/MHT/Case Management/Adjunct)  08/11/2012   Type of Therapy:  Group Therapy  Participation Level:  Active  Participation Quality:  Appropriate  Affect:  Appropriate  Cognitive:  Alert  Insight:  Good  Engagement in Group:  Good  Engagement in Therapy:  Good  Modes of Intervention:  Socialization, Support, Clarification and Education  Summary of Progress/Problems:The main focus of the process group was for the patient to identify ways to remain safe upon discharge and ways to prevent future hospitalizations. The patient stated that he was going to attend outpatient services for medication management and outpatient therapy.    Dustin Cruz [name], [place] 08/11/2012 4:05 PM

## 2012-08-11 NOTE — H&P (Signed)
  Pt was seen by me today and I agree with the key elements documented in H&P. Also see my SI assessment note for additional plan recommendations. 

## 2012-08-11 NOTE — [facility] Suicide Risk Assessment (Signed)
Suicide Risk Assessment  Admission Assessment     Nursing information obtained from:  Patient Demographic factors:  Male;Caucasian;Unemployed Current Mental Status:  Suicidal ideation indicated by patient;Suicide plan;Self-harm thoughts;Intention to act on suicide plan Loss Factors:  Decline in physical health Historical Factors:  Family history of mental illness or substance abuse;Domestic violence in family of origin Risk Reduction Factors:  Sense of responsibility to family;Religious beliefs about death;Living with another person, especially a relative;Positive therapeutic relationship  CLINICAL FACTORS:   Depression:   Hopelessness  COGNITIVE FEATURES THAT CONTRIBUTE TO RISK:  Closed-mindedness    SUICIDE RISK:   Mild:  Suicidal ideation of limited frequency, intensity, duration, and specificity.  There are no identifiable plans, no associated intent, mild dysphoria and related symptoms, good self-control (both objective and subjective assessment), few other risk factors, and identifiable protective factors, including available and accessible social support.  PLAN OF CARE: Mental Status Examination/Evaluation:  Objective: Appearance: Disheveled   Eye Contact:: Fair   Speech: Clear and Coherent   Volume: Normal   Mood: Depressed and Hopeless   Affect: Blunt   Thought Process: Logical   Orientation: Full   Thought Content: WDL   Suicidal Thoughts: yes at times  Homicidal Thoughts: No   Memory: Immediate; Good  Recent; Good  Remote; Good   Judgement: Fair   Insight: Lacking   Psychomotor Activity: Decreased   Concentration: Good   Recall: Good   Akathisia: No   Handed:   AIMS (if indicated):   Assets: Communication Skills  Desire for Improvement  Housing  Social Support   Sleep: Number of Hours: 7    Laboratory/X-Ray  Psychological Evaluation(s)      Assessment:  AXIS I: Major Depression, Recurrent severe  AXIS II: Deferred  AXIS III:  Past Medical History     Diagnosis  Date   .  Diabetes mellitus      Type 2   .  Hyperlipidemia    .  Depression    .  Coronary artery disease      s/p BMS to Ramus 10/2010; Cath 01/22/12 prox 30-40% LAD, LCx ramus w/ hazy 70-80% in-stent restenosis, EF 60% treated medically   .  HTN (hypertension)    .  Tobacco abuse    .  Obesity    .  Anxiety    .  Melanoma  2007     surgery at [facility]   .  Myocardial infarction  2012   .  GERD (gastroesophageal reflux disease)    .  Hyperlipidemia    .  No pertinent past medical history    .  Asthma     AXIS IV: educational problems and problems with primary support group  AXIS V: 11-20 some danger of hurting self or others possible OR occasionally fails to maintain minimal personal hygiene OR gross impairment in communication  Treatment Plan/Recommendations:  Treatment Plan Summary:  Daily contact with patient to assess and evaluate symptoms and progress in treatment  Medication management  Start Zoloft  tsh and t4   Wonda Cerise 08/11/2012, 4:40 PM
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
## 2012-08-11 NOTE — Progress Notes (Signed)
Dustin Cruz is seen OOB UAL on the 500 hall this AM...he is quiet, sad and depressed. PLeasant and cooperative in his dealing s with this Clinical research associate. HE shares that he has suffered many loses over the past few years and that things just kinda " got all over me".   A He attends his groups, is engaged in the group discussion and he is asking the appropriate questiions about his POC. He completed his AM self inventory and on it he wrote he denied SI within the past 24 hrs, he rated his depression and hopelessness "5/6" and he said his DC plan is to " I'm gonna be better when I get home".   R Safety is in place and POC includes continuing to foster therapeutic relationhip already fostered.

## 2012-08-12 DIAGNOSIS — F411 Generalized anxiety disorder: Secondary | ICD-10-CM

## 2012-08-12 LAB — GLUCOSE, CAPILLARY
Glucose-Capillary: 195 mg/dL — ABNORMAL HIGH (ref 70–99)
Glucose-Capillary: 305 mg/dL — ABNORMAL HIGH (ref 70–99)

## 2012-08-12 LAB — T4, FREE: Free T4: 1.44 ng/dL (ref 0.80–1.80)

## 2012-08-12 NOTE — Progress Notes (Signed)
[facility] MD Progress Note  08/12/2012 1:11 PM Dustin Cruz  MRN:  [id number]  S: Patient seen, reports feeling depressed. Continues to have suicidal thoughts. Depression at a level of 3 on a scale of 1-10 with 10 being his best mood.  Diagnosis:   Axis I: Depressive Disorder NOS Axis II: No diagnosis Axis III:  Past Medical History  Diagnosis Date  . Diabetes mellitus     Type 2  . Hyperlipidemia   . Depression   . Coronary artery disease     s/p BMS to Ramus 10/2010;  Cath 01/22/12 prox 30-40% LAD, LCx ramus w/ hazy 70-80% in-stent restenosis, EF 60% treated medically  . HTN (hypertension)   . Tobacco abuse   . Obesity   . Anxiety   . Melanoma 2007    surgery at [facility]  . Myocardial infarction 2012  . GERD (gastroesophageal reflux disease)   . Hyperlipidemia   . No pertinent past medical history   . Asthma    Axis IV: economic problems Axis V: 51-60 moderate symptoms  ADL's:  Intact  Sleep: Fair  Appetite:  Fair   Mental Status Examination/Evaluation: Objective:  Appearance: Disheveled  Eye Contact::  Fair  Speech:  Clear and Coherent  Volume:  Normal  Mood:  Depressed  Affect:  Blunt  Thought Process:  Coherent  Orientation:  Full  Thought Content:  WDL  Suicidal Thoughts:  Yes.  without intent/plan  Homicidal Thoughts:  No  Memory:  Immediate;   Fair Recent;   Fair Remote;   Fair  Judgement:  Fair  Insight:  Fair  Psychomotor Activity:  Normal  Concentration:  Fair  Recall:  Fair  Akathisia:  No  Handed:  Right  AIMS (if indicated):     Assets:  Desire for Improvement  Sleep:  Number of Hours: 6    Vital Signs:Blood pressure 149/88, pulse 93, temperature 97.8 F (36.6 C), temperature source Oral, resp. rate 16, height 6\' 2"  (1.88 m), weight 99.565 kg (219 lb 8 oz). Current Medications: Current Facility-Administered Medications  Medication Dose Route Frequency Provider Last Rate Last Dose  . acetaminophen (TYLENOL) tablet 650 mg  650 mg Oral Q6H PRN  Shuvon Rankin, NP   650 mg at 08/12/12 1145  . albuterol (PROVENTIL HFA;VENTOLIN HFA) 108 (90 BASE) MCG/ACT inhaler 2 puff  2 puff Inhalation Q6H PRN Wonda Cerise, MD      . alum & mag hydroxide-simeth (MAALOX/MYLANTA) 200-200-20 MG/5ML suspension 30 mL  30 mL Oral Q4H PRN Shuvon Rankin, NP      . aspirin EC tablet 325 mg  325 mg Oral Daily Shuvon Rankin, NP   325 mg at 08/12/12 0836  . gabapentin (NEURONTIN) capsule 400 mg  400 mg Oral TID Jorje Guild, PA-C   400 mg at 08/12/12 4098  . glipiZIDE (GLUCOTROL) tablet 5 mg  5 mg Oral BID AC Shuvon Rankin, NP   5 mg at 08/12/12 0626  . insulin glargine (LANTUS) injection 54 Units  54 Units Subcutaneous QHS Jorje Guild, PA-C   54 Units at 08/11/12 2135  . levofloxacin (LEVAQUIN) tablet 750 mg  750 mg Oral Daily Shuvon Rankin, NP   750 mg at 08/12/12 0837  . magnesium hydroxide (MILK OF MAGNESIA) suspension 30 mL  30 mL Oral Daily PRN Shuvon Rankin, NP      . metoprolol succinate (TOPROL-XL) 24 hr tablet 25 mg  25 mg Oral Daily Shuvon Rankin, NP   25 mg at 08/12/12 0837  .  mometasone-formoterol (DULERA) 100-5 MCG/ACT inhaler 2 puff  2 puff Inhalation BID Wonda Cerise, MD   2 puff at 08/12/12 0836  . pantoprazole (PROTONIX) EC tablet 40 mg  40 mg Oral Daily Shuvon Rankin, NP   40 mg at 08/12/12 0838  . prasugrel (EFFIENT) tablet 10 mg  10 mg Oral Daily Shuvon Rankin, NP   10 mg at 08/12/12 0838  . sertraline (ZOLOFT) tablet 25 mg  25 mg Oral Daily Jorje Guild, PA-C   25 mg at 08/12/12 0981  . traZODone (DESYREL) tablet 50 mg  50 mg Oral QHS,MR X 1 Jorje Guild, PA-C   50 mg at 08/11/12 2234  . [DISCONTINUED] LORazepam (ATIVAN) tablet 1 mg  1 mg Oral Q8H PRN Shuvon Rankin, NP   1 mg at 08/10/12 2113  . [DISCONTINUED] traZODone (DESYREL) tablet 50 mg  50 mg Oral QHS PRN Shuvon Rankin, NP   50 mg at 08/10/12 2113    Lab Results:  Results for orders placed during the hospital encounter of 08/10/12 (from the past 48 hour(s))  GLUCOSE, CAPILLARY     Status: Abnormal    Collection Time   08/10/12  9:00 PM      Component Value Range Comment   Glucose-Capillary 214 (*) 70 - 99 mg/dL    Comment 1 Notify RN     GLUCOSE, CAPILLARY     Status: Abnormal   Collection Time   08/11/12  6:10 AM      Component Value Range Comment   Glucose-Capillary 186 (*) 70 - 99 mg/dL   GLUCOSE, CAPILLARY     Status: Abnormal   Collection Time   08/11/12  9:40 PM      Component Value Range Comment   Glucose-Capillary 300 (*) 70 - 99 mg/dL    Comment 1 Documented in Chart      Comment 2 Notify RN     GLUCOSE, CAPILLARY     Status: Abnormal   Collection Time   08/12/12  5:51 AM      Component Value Range Comment   Glucose-Capillary 195 (*) 70 - 99 mg/dL    Comment 1 Notify RN     TSH     Status: Normal   Collection Time   08/12/12  6:15 AM      Component Value Range Comment   TSH 1.622  0.350 - 4.500 uIU/mL   T4, FREE     Status: Normal   Collection Time   08/12/12  6:15 AM      Component Value Range Comment   Free T4 1.44  0.80 - 1.80 ng/dL   GLUCOSE, CAPILLARY     Status: Abnormal   Collection Time   08/12/12 11:43 AM      Component Value Range Comment   Glucose-Capillary 227 (*) 70 - 99 mg/dL     Physical Findings: AIMS: Facial and Oral Movements Muscles of Facial Expression: None, normal Lips and Perioral Area: None, normal Jaw: None, normal Tongue: None, normal,Extremity Movements Upper (arms, wrists, hands, fingers): None, normal Lower (legs, knees, ankles, toes): None, normal, Trunk Movements Neck, shoulders, hips: None, normal, Overall Severity Severity of abnormal movements (highest score from questions above): None, normal Incapacitation due to abnormal movements: None, normal Patient's awareness of abnormal movements (rate only patient's report): No Awareness, Dental Status Current problems with teeth and/or dentures?: No Does patient usually wear dentures?: No  CIWA:    COWS:     Treatment Plan Summary: Daily contact with patient to assess  and  evaluate symptoms and progress in treatment Medication management  Plan: Continue current plan of care.  Daryn Pisani 08/12/2012, 1:11 PM

## 2012-08-12 NOTE — Progress Notes (Signed)
[facility] Group Notes:  (Counselor/Nursing/MHT/Case Management/Adjunct)  08/12/2012 3:57 PM  Type of Therapy:  Psychoeducational Skills  Participation Level:  Active  Participation Quality:  Appropriate  Affect:  Appropriate  Cognitive:  Appropriate  Insight:  Good  Engagement in Group:  Good  Engagement in Therapy:  Good  Modes of Intervention:  Education  Summary of Progress/Problems:Staff explained to the patient that this group is designed to assist in identifying activities that they will incorporate into their daily living with the intention of improving or restoring emotional, physical, spiritual, interpersonal and financial health. The patients were asked to identify one area or skill where they are taking care of themselves. Patients will identify 2-3 activities of self- care that they will use in their daily living after discharge. Patients were encouraged to utilize the skills and techniques taught and apply it in their daily routine.   Ardelle Park O 08/12/2012, 3:57 PM
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
## 2012-08-12 NOTE — Progress Notes (Signed)
[facility] Adult Inpatient Family/Significant Other Suicide Prevention Education  Suicide Prevention Education:  Education Completed; Jessi Jessop, Wife [phone],  has been identified by the patient as the family member/significant other with whom the patient will be residing, and identified as the person(s) who will aid the patient in the event of a mental health crisis (suicidal ideations/suicide attempt).  With written consent from the patient, the family member/significant other has been provided the following suicide prevention education, prior to the and/or following the discharge of the patient.  The suicide prevention education provided includes the following:  Suicide risk factors  Suicide prevention and interventions  National Suicide Hotline telephone number  [facility] assessment telephone number  [facility] Emergency Assistance 911  [facility] and/or Residential Mobile Crisis Unit telephone number  Request made of family/significant other to:  Remove weapons (e.g., guns, rifles, knives), all items previously/currently identified as safety concern.  Wife advised of no guns in the home.  Remove drugs/medications (over-the-counter, prescriptions, illicit drugs), all items previously/currently identified as a safety concern.  The family member/significant other verbalizes understanding of the suicide prevention education information provided.  The family member/significant other agrees to remove the items of safety concern listed above.  Wynn Banker 08/12/2012, 3:50 PM

## 2012-08-12 NOTE — Progress Notes (Signed)
Patient ID: Dustin Cruz, male   DOB: Sep 21, 1966, 46 y.o.   MRN: [id number] D- patient reports depression at level 8  and hopelessness at 10. Denies SI/HI/AV hall , " I slept terrible last night." eyes red and bloodshot.  A- Affect blunted , mood depressed. Attending and participating in groups. Appearance is dishevel encouraged to take shower .c/o chest discomfort  Feels its related to playing basketball yesterday. Tylenol given.  R-continue to monitor on 15 minute checks and monitor for safety. Taking medications as prescribed .

## 2012-08-12 NOTE — Progress Notes (Signed)
[facility] Group Notes:  (Counselor/Nursing/MHT/Case Management/Adjunct)      Overcoming Obstacles 1:15 -2:30 PM       Discharge Planning Group 8:30 - 9:30 AM   08/12/2012 2:44 PM  Type of Therapy:  Group Therapy  Participation Level:  Minimal  Participation Quality:  Appropriate and Attentive  Affect:  Appropriate  Cognitive:  Alert and Appropriate  Insight:  Limited  Engagement in Group:  Limited  Engagement in Therapy:  Limited  Modes of Intervention:  Education, Problem-solving and Clarification  Summary of Progress/Problems:  Dustin Cruz was attentive but had limited participation in group.    He attended discharge planning group.  He denied SI/HI. He shared he has been depressed since the death of a cousin who was like a sister.   He rated depression and anxiety at seven.   Dustin Cruz 08/12/2012, 2:44 PM

## 2012-08-12 NOTE — Progress Notes (Signed)
Writer observed patient up in the dayroom interacting with peers this evening. Patient attended AA meeting this evening. Patient reports having had a good day and has voiced no complaints. Patient currently denies pain, -si/hi/a/v hall. Patient compliant with scheduled medications. Support and encouragement offered, safety maintained on unit, will continue to monitor.
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
## 2012-08-13 DIAGNOSIS — F329 Major depressive disorder, single episode, unspecified: Secondary | ICD-10-CM

## 2012-08-13 HISTORY — DX: Major depressive disorder, single episode, unspecified: F32.9

## 2012-08-13 LAB — GLUCOSE, CAPILLARY: Glucose-Capillary: 252 mg/dL — ABNORMAL HIGH (ref 70–99)

## 2012-08-13 MED ORDER — SERTRALINE HCL 50 MG PO TABS
50.0000 mg | ORAL_TABLET | Freq: Every day | ORAL | Status: DC
  Filled 2012-08-13 (×2): qty 1

## 2012-08-13 MED ORDER — METOPROLOL SUCCINATE ER 25 MG PO TB24: 25.0000 mg | ORAL_TABLET | Freq: Every day | ORAL | Status: DC

## 2012-08-13 MED ORDER — ALBUTEROL SULFATE HFA 108 (90 BASE) MCG/ACT IN AERS: 2.0000 | INHALATION_SPRAY | Freq: Four times a day (QID) | RESPIRATORY_TRACT | Status: DC | PRN

## 2012-08-13 MED ORDER — GEMFIBROZIL 600 MG PO TABS: 600.0000 mg | ORAL_TABLET | Freq: Two times a day (BID) | ORAL | Status: DC

## 2012-08-13 MED ORDER — ASPIRIN 325 MG PO TBEC: 325.0000 mg | DELAYED_RELEASE_TABLET | Freq: Every morning | ORAL | Status: DC

## 2012-08-13 MED ORDER — ROSUVASTATIN CALCIUM 10 MG PO TABS: 10.0000 mg | ORAL_TABLET | Freq: Every day | ORAL | Status: DC

## 2012-08-13 MED ORDER — MOMETASONE FURO-FORMOTEROL FUM 100-5 MCG/ACT IN AERO: 2.0000 | INHALATION_SPRAY | Freq: Two times a day (BID) | RESPIRATORY_TRACT | Status: DC

## 2012-08-13 MED ORDER — PIOGLITAZONE HCL 15 MG PO TABS: 15.0000 mg | ORAL_TABLET | Freq: Every day | ORAL | Status: DC

## 2012-08-13 MED ORDER — PANTOPRAZOLE SODIUM 40 MG PO TBEC: 40.0000 mg | DELAYED_RELEASE_TABLET | Freq: Every day | ORAL | Status: DC

## 2012-08-13 MED ORDER — METFORMIN HCL 1000 MG PO TABS: 1000.0000 mg | ORAL_TABLET | Freq: Two times a day (BID) | ORAL | Status: DC

## 2012-08-13 MED ORDER — GABAPENTIN 400 MG PO CAPS: 400.0000 mg | ORAL_CAPSULE | Freq: Three times a day (TID) | ORAL | Status: DC

## 2012-08-13 MED ORDER — INSULIN GLARGINE 100 UNIT/ML ~~LOC~~ SOLN: 54.0000 [IU] | Freq: Every day | SUBCUTANEOUS | Status: DC

## 2012-08-13 MED ORDER — PRASUGREL HCL 10 MG PO TABS: 10.0000 mg | ORAL_TABLET | Freq: Every day | ORAL | Status: DC

## 2012-08-13 MED ORDER — SERTRALINE HCL 50 MG PO TABS: 50.0000 mg | ORAL_TABLET | Freq: Every day | ORAL | Status: DC

## 2012-08-13 MED ORDER — GLIPIZIDE 5 MG PO TABS: 5.0000 mg | ORAL_TABLET | Freq: Two times a day (BID) | ORAL | Status: DC

## 2012-08-13 MED ORDER — NITROGLYCERIN 0.4 MG SL SUBL: 0.4000 mg | SUBLINGUAL_TABLET | SUBLINGUAL | Status: DC | PRN

## 2012-08-13 MED ORDER — SERTRALINE HCL 25 MG PO TABS
25.0000 mg | ORAL_TABLET | Freq: Once | ORAL | Status: AC
  Administered 2012-08-13: 25 mg via ORAL
  Filled 2012-08-13: qty 1

## 2012-08-13 NOTE — Progress Notes (Signed)
Discharge Note:   Patient discharged home with wife.   Denied SI and HI.   Denied A/V hallucinations.   Denied pain.  Stated he appreciated all assistance received from staff.  Patient received all his belongings, clothing, medications, notebook, belt, hat, wallet, discharge instructions, miscellaneous items.  Patient has been cooperative and pleasant.  Patient received suicide prevention information which he stated he understood and had no questions about his discharge instructions.

## 2012-08-13 NOTE — Discharge Summary (Signed)
Physician Discharge Summary Note  Patient:  Dustin Cruz is an 46 y.o., male MRN:  [id number] DOB:  03-Mar-1966 Patient phone:  [phone] (home)  Patient address:   [address]  [place] [address],   Date of Admission:  08/10/2012 Date of Discharge: 08/13/2012   Reason for Admission: Pt's first admission for due to suicide plan to overdose on his multiple medications.  He reports that he has been depressed for approximately 4 months. He also cites many psychosocial problems including difficulty in his marriage, and deaths of family members and his AA sponsor recently. Dustin Cruz suffered a heart attack approximately one year ago. 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  He has been treated in the past by Dr. Mammie Cruz, psychiatrist, who was prescribing Lexapro at a dose of 40 mg daily, but tapered him off of it about one year ago.   Discharge Diagnoses: Active Problems:  Major depression, chronic  Discharge Diagnoses:  AXIS I: Depressive Disorder NOS  AXIS II: No diagnosis  AXIS III:  Past Medical History   Diagnosis  Date   .  Diabetes mellitus      Type 2   .  Hyperlipidemia    .  Depression    .  Coronary artery disease      s/p BMS to Ramus 10/2010; Cath 01/22/12 prox 30-40% LAD, LCx ramus w/ hazy 70-80% in-stent restenosis, EF 60% treated medically   .  HTN (hypertension)    .  Tobacco abuse    .  Obesity    .  Anxiety    .  Melanoma  2007     surgery at [facility]   .  Myocardial infarction  2012   .  GERD (gastroesophageal reflux disease)    .  Hyperlipidemia    .  No pertinent past medical history    .  Asthma     AXIS IV: economic problems  AXIS V: 61-70 mild symptoms  Level of Care:  OP  Hospital Course:  Pt admitted with SI. Pt began on zoloft 25 mg daily, which enhanced mood. Zoloft was increased to 50 mg daily for further mood stabilization prior to discharge.  Consults:  None  Significant  Diagnostic Studies:  None  Discharge Vitals:   Blood pressure 101/62, pulse 95, temperature 98.3 F (36.8 C), temperature source Oral, resp. rate 18, height 6\' 2"  (1.88 m), weight 99.565 kg (219 lb 8 oz). Lab Results:   Results for orders placed during the hospital encounter of 08/10/12 (from the past 72 hour(s))  GLUCOSE, CAPILLARY     Status: Abnormal   Collection Time   08/10/12  9:00 PM      Component Value Range Comment   Glucose-Capillary 214 (*) 70 - 99 mg/dL    Comment 1 Notify RN     GLUCOSE, CAPILLARY     Status: Abnormal   Collection Time   08/11/12  6:10 AM      Component Value Range Comment   Glucose-Capillary 186 (*) 70 - 99 mg/dL   GLUCOSE, CAPILLARY     Status: Abnormal   Collection Time   08/11/12  9:40 PM      Component Value Range Comment   Glucose-Capillary 300 (*) 70 - 99 mg/dL    Comment 1 Documented in Chart      Comment 2 Notify RN     GLUCOSE, CAPILLARY     Status:  Abnormal   Collection Time   08/12/12  5:51 AM      Component Value Range Comment   Glucose-Capillary 195 (*) 70 - 99 mg/dL    Comment 1 Notify RN     TSH     Status: Normal   Collection Time   08/12/12  6:15 AM      Component Value Range Comment   TSH 1.622  0.350 - 4.500 uIU/mL   T4, FREE     Status: Normal   Collection Time   08/12/12  6:15 AM      Component Value Range Comment   Free T4 1.44  0.80 - 1.80 ng/dL   GLUCOSE, CAPILLARY     Status: Abnormal   Collection Time   08/12/12 11:43 AM      Component Value Range Comment   Glucose-Capillary 227 (*) 70 - 99 mg/dL   GLUCOSE, CAPILLARY     Status: Abnormal   Collection Time   08/12/12  5:16 PM      Component Value Range Comment   Glucose-Capillary 357 (*) 70 - 99 mg/dL   GLUCOSE, CAPILLARY     Status: Abnormal   Collection Time   08/12/12  9:16 PM      Component Value Range Comment   Glucose-Capillary 305 (*) 70 - 99 mg/dL   GLUCOSE, CAPILLARY     Status: Abnormal   Collection Time   08/13/12  6:00 AM      Component Value Range  Comment   Glucose-Capillary 252 (*) 70 - 99 mg/dL     Physical Findings: AIMS: Facial and Oral Movements Muscles of Facial Expression: None, normal Lips and Perioral Area: None, normal Jaw: None, normal Tongue: None, normal, Extremity Movements Upper (arms, wrists, hands, fingers): None, normal Lower (legs, knees, ankles, toes): None, normal,  Trunk Movements Neck, shoulders, hips: None, normal, Overall Severity Severity of abnormal movements (highest score from questions above): None, normal Incapacitation due to abnormal movements: None, normal Patient's awareness of abnormal movements (rate only patient's report): No Awareness,  Dental Status Current problems with teeth and/or dentures?: No Does patient usually wear dentures?: No  CIWA:    COWS:     Mental Status Exam: See Mental Status Examination and Suicide Risk Assessment completed by Attending Physician prior to discharge.  Discharge destination:  Home  Is patient on multiple antipsychotic therapies at discharge:  No   Has Patient had three or more failed trials of antipsychotic monotherapy by history:  No  Recommended Plan for Multiple Antipsychotic Therapies: na  Discharge Orders    Future Orders Please Complete By Expires   Diet - low sodium heart healthy      Increase activity slowly      Discharge instructions      Comments:   Take all of your medications as prescribed.  Be sure to keep ALL follow up appointments as scheduled. This is to ensure getting your refills on time to avoid any interruption in your medication.  If you find that you can not keep your appointment, call the clinic and reschedule. Be sure to tell the nurse if you will need a refill before your appointment.       Medication List     As of 08/13/2012 11:52 AM    STOP taking these medications         guaiFENesin 600 MG 12 hr tablet   Commonly known as: MUCINEX      levofloxacin 750 MG tablet   Commonly known  as: LEVAQUIN       loratadine 10 MG tablet   Commonly known as: CLARITIN      traZODone 50 MG tablet   Commonly known as: DESYREL      TAKE these medications      Indication    albuterol 108 (90 BASE) MCG/ACT inhaler   Commonly known as: PROVENTIL HFA;VENTOLIN HFA   Inhale 2 puffs into the lungs every 6 (six) hours as needed for wheezing.    Indication: Disease involving Spasms of the Lungs      aspirin 325 MG EC tablet   Take 1 tablet (325 mg total) by mouth every morning.    Indication: Heart Attack      gabapentin 400 MG capsule   Commonly known as: NEURONTIN   Take 1 capsule (400 mg total) by mouth 3 (three) times daily.    Indication: Nerve Pain      gemfibrozil 600 MG tablet   Commonly known as: LOPID   Take 1 tablet (600 mg total) by mouth 2 (two) times daily before a meal.    Indication: hypercholesterolemia      glipiZIDE 5 MG tablet   Commonly known as: GLUCOTROL   Take 1 tablet (5 mg total) by mouth 2 (two) times daily before a meal.    Indication: Type 2 Diabetes      insulin glargine 100 UNIT/ML injection   Commonly known as: LANTUS   Inject 54 Units into the skin at bedtime.    Indication: Type 2 Diabetes      metFORMIN 1000 MG tablet   Commonly known as: GLUCOPHAGE   Take 1 tablet (1,000 mg total) by mouth 2 (two) times daily with a meal. HOLD for 48 hours, restart on April 18th.    Indication: Type 2 Diabetes      metoprolol succinate 25 MG 24 hr tablet   Commonly known as: TOPROL-XL   Take 1 tablet (25 mg total) by mouth daily.    Indication: High Blood Pressure      mometasone-formoterol 100-5 MCG/ACT Aero   Commonly known as: DULERA   Inhale 2 puffs into the lungs 2 (two) times daily.    Indication: Asthma      nitroGLYCERIN 0.4 MG SL tablet   Commonly known as: NITROSTAT   Place 1 tablet (0.4 mg total) under the tongue every 5 (five) minutes as needed. For chest pain.    Indication: Acute Angina Pectoris      pantoprazole 40 MG tablet   Commonly known as:  PROTONIX   Take 1 tablet (40 mg total) by mouth daily.    Indication: Gastroesophageal Reflux Disease      pioglitazone 15 MG tablet   Commonly known as: ACTOS   Take 1 tablet (15 mg total) by mouth daily.    Indication: Type 2 Diabetes      prasugrel 10 MG Tabs   Commonly known as: EFFIENT   Take 1 tablet (10 mg total) by mouth daily.    Indication: Acute Coronary Syndrome      rosuvastatin 10 MG tablet   Commonly known as: CRESTOR   Take 1 tablet (10 mg total) by mouth daily.    Indication: hypercholesterolemia      sertraline 50 MG tablet   Commonly known as: ZOLOFT   Take 1 tablet (50 mg total) by mouth daily.    Indication: Major Depressive Disorder           Follow-up Information    Follow  up with [facility]. On 08/15/2012. (You are scheduled with [facility] on Thursday, August 15, 2012 at 7:45 AM.  Referral # [id number].)    Contact information:   [address] [place], [place]  [address]  [phone]         Follow-up recommendations:  Take all of your medications as prescribed.  Be sure to keep ALL follow up appointments as scheduled. This is to ensure getting your refills on time to avoid any interruption in your medication.  If you find that you can not keep your appointment, call the clinic and reschedule. Be sure to tell the nurse if you will need a refill before your appointment.   Comments:  Please follow up with your PCM for fasting blood sugars elevated (hyperglycemia). Additionally, if blood pressure remains elevated, please see Cardiologist/PCM.  Signed: Norval Gable FNP-BC 08/13/2012, 11:52 AM

## 2012-08-13 NOTE — Progress Notes (Signed)
Psychoeducational Group Note  Date:  08/13/2012 Time:  1100  Group Topic/Focus:  Recovery Goals:   The focus of this group is to identify appropriate goals for recovery and establish a plan to achieve them.  Participation Level:  Active  Participation Quality:  Appropriate  Affect:  Appropriate  Cognitive:  Alert, Appropriate and Oriented  Insight:  Good  Engagement in Group:  Good  Additional Comments:  Set goal to be more healthy, eating  better and exercising more.  Ralph Leyden 08/13/2012, 1:30 PM

## 2012-08-13 NOTE — Progress Notes (Signed)
Pt reports he is doing fine.  He attended evening group on the 300 hall tonight.  Pt said he was suicidal when he was admitted, but he says he no longer feels that way.  He is still depressed, but can contract for safety.  He denies HI/AV.  Pt, earlier in the day, reported chest pain, but he had no complaints this evening.  He voiced no needs/concerns.  Pt makes his needs known to staff.  He is unsure of his discharge plans.  Support/encouragement given.  Safety maintained with q15 minute checks.

## 2012-08-13 NOTE — Progress Notes (Signed)
[facility] Case Management Discharge Plan:  Will you be returning to the same living situation after discharge: Yes,  Patient to return to his home with wife At discharge, do you have transportation home?:Yes,  Patient will arrange for a family member to transport him home Do you have the ability to pay for your medications:No. Patient will need assistance with medications  Interagency Information:     Release of information consent forms completed and in the chart;  Patient's signature needed at discharge.  Patient to Follow up at:  Follow-up Information    Follow up with [facility]. On 08/15/2012. (You are scheduled with [facility] on Thursday, August 15, 2012 at 7:45 AM.  Referral # [id number].)    Contact information:   [address] [place], [place]  [address]  [phone]         Patient denies SI/HI:   Yes,  Patient is not endorsing SI/HI or other thoughts of self harm    Safety Planning and Suicide Prevention discussed: Yes.  Reviewed during aftercare group.  Barrier to discharge identified:No.  Summary and Recommendations: Patient encouraged to be compliant with medications and follow up with outpatient recommendations.   Wynn Banker 08/13/2012, 11:37 AM

## 2012-08-13 NOTE — Progress Notes (Signed)
Patient ID: Dustin Cruz, male   DOB: [birth year], 46 y.o.   MRN: [id number] P- Patient reports depression and anxiety are less. Hopelessness at 1, Denies SI/HI/AV hall . " I'm going to follow up  with my Dr. In Michell Heinrich, my  psychiatrist when I leave." Reports sleep was better last night.  A- appearance is unkempt, encouraged to shower. BS-252. Reports this is a normal level for him . Educated on importance of following a ADA diet.   R- Patient agreed to try to be more compliant with diet, also stated he was going back to wife. Continue to monitor on  15 checks for safety.

## 2012-08-13 NOTE — [facility] Suicide Risk Assessment (Signed)
Suicide Risk Assessment  Discharge Assessment     Demographic Factors:  Male, Caucasian and Low socioeconomic status  Mental Status Per Nursing Assessment::   On Admission:  Suicidal ideation indicated by patient;Suicide plan;Self-harm thoughts;Intention to act on suicide plan  Current Mental Status by Physician: Patient reports improved mood. Alert and oriented to 4.denies AH/VH/SI/HI.  Loss Factors: Financial problems/change in socioeconomic status  Historical Factors: Impulsivity  Risk Reduction Factors:   Living with another person, especially a relative and Positive social support  Continued Clinical Symptoms:  Depression:   Recent sense of peace/wellbeing  Cognitive Features That Contribute To Risk:  Cognitively intact  Suicide Risk:  Minimal: No identifiable suicidal ideation.  Patients presenting with no risk factors but with morbid ruminations; may be classified as minimal risk based on the severity of the depressive symptoms  Discharge Diagnoses:   AXIS I:  Depressive Disorder NOS AXIS II:  No diagnosis AXIS III:   Past Medical History  Diagnosis Date  . Diabetes mellitus     Type 2  . Hyperlipidemia   . Depression   . Coronary artery disease     s/p BMS to Ramus 10/2010;  Cath 01/22/12 prox 30-40% LAD, LCx ramus w/ hazy 70-80% in-stent restenosis, EF 60% treated medically  . HTN (hypertension)   . Tobacco abuse   . Obesity   . Anxiety   . Melanoma 2007    surgery at [facility]  . Myocardial infarction 2012  . GERD (gastroesophageal reflux disease)   . Hyperlipidemia   . No pertinent past medical history   . Asthma    AXIS IV:  economic problems AXIS V:  61-70 mild symptoms  Plan Of Care/Follow-up recommendations:  Activity:  normal Diet:  normal  Is patient on multiple antipsychotic therapies at discharge:  No   Has Patient had three or more failed trials of antipsychotic monotherapy by history:  No  Recommended Plan for Multiple Antipsychotic  Therapies: NA  Dorothyann Mourer 08/13/2012, 11:12 AM

## 2012-08-13 NOTE — Discharge Summary (Signed)
Reviewed. Agree with recommendations, SRA completed.

## 2012-08-13 NOTE — Progress Notes (Signed)
Psychoeducational Group Note  Date:  08/13/2012 Time:  2000  Group Topic/Focus:  Wrap-Up Group:   The focus of this group is to help patients review their daily goal of treatment and discuss progress on daily workbooks.  Participation Level:  Active  Participation Quality:  Appropriate, Attentive, Sharing and Supportive  Affect:  Appropriate  Cognitive:  Alert and Appropriate  Insight:  Good  Engagement in Group:  Good   Additional Comments: Pt attended and participated in group discussing pts setting goals and accomplishing them throughout the day. Pt stated that his day was good and he was looking forward to discharge tomorrow.   Dalia Heading 08/13/2012, 3:34 PM

## 2012-08-14 NOTE — Progress Notes (Addendum)
Patient Discharge Instructions:  After Visit Summary (AVS):   Faxed to:  08/14/2012 Psychiatric Admission Assessment Note:   Faxed to:  08/14/2012 Suicide Risk Assessment - Discharge Assessment:   Faxed to:  08/14/2012 Faxed/Sent to the Next Level Care provider:  08/14/2012  [facility] @ [phone]  Heloise Purpura, Eduard Clos, 08/14/2012, 3:12 PM

## 2012-09-13 ENCOUNTER — Other Ambulatory Visit: Payer: Self-pay | Admitting: Cardiology

## 2012-09-13 ENCOUNTER — Ambulatory Visit (INDEPENDENT_AMBULATORY_CARE_PROVIDER_SITE_OTHER): Payer: Medicaid Other | Admitting: Adult Health

## 2012-09-13 ENCOUNTER — Encounter: Payer: Self-pay | Admitting: Adult Health

## 2012-09-13 VITALS — BP 130/72 | HR 79 | Ht 74.0 in | Wt 227.0 lb

## 2012-09-13 DIAGNOSIS — F17200 Nicotine dependence, unspecified, uncomplicated: Secondary | ICD-10-CM

## 2012-09-13 DIAGNOSIS — E7800 Pure hypercholesterolemia, unspecified: Secondary | ICD-10-CM

## 2012-09-13 DIAGNOSIS — I2510 Atherosclerotic heart disease of native coronary artery without angina pectoris: Secondary | ICD-10-CM

## 2012-09-13 MED ORDER — METOPROLOL SUCCINATE ER 25 MG PO TB24: 25.0000 mg | ORAL_TABLET | Freq: Every day | ORAL | Status: DC

## 2012-09-13 MED ORDER — PRASUGREL HCL 10 MG PO TABS: 10.0000 mg | ORAL_TABLET | Freq: Every day | ORAL | Status: DC

## 2012-09-13 NOTE — Assessment & Plan Note (Signed)
He has not had lipids and LFT's drawn for over 6 months. Will order these along with labs previously ordered by [facility] Dept.

## 2012-09-13 NOTE — Patient Instructions (Addendum)
Your physician recommends that you schedule a follow-up appointment in: 6 MONTHS WITH KL/RR  Your physician recommends that you return for lab work in: TOMORROW  LAB OFFICE IS OPEN FROM 8-12

## 2012-09-13 NOTE — Progress Notes (Deleted)
Name: Dustin Cruz    DOB: 10-01-1966  Age: 46 y.o.  MR#: [id number]       PCP:  MUSE,ROCHELLE D., PA      Insurance: @PAYORNAME @   CC:   No chief complaint on file.   VS BP 130/72  Pulse 79  Ht 6\' 2"  (1.88 m)  Wt 227 lb (102.967 kg)  BMI 29.15 kg/m2  SpO2 96%  Weights Current Weight  09/13/12 227 lb (102.967 kg)  08/10/12 219 lb 8 oz (99.565 kg)  08/09/12 224 lb (101.606 kg)    Blood Pressure  BP Readings from Last 3 Encounters:  09/13/12 130/72  08/13/12 101/62  08/10/12 139/98     Admit date:  (Not on file) Last encounter with RMR:  Visit date not found   Allergy No Known Allergies  Current Outpatient Prescriptions  Medication Sig Dispense Refill  . albuterol (PROVENTIL HFA;VENTOLIN HFA) 108 (90 BASE) MCG/ACT inhaler Inhale 2 puffs into the lungs every 6 (six) hours as needed for wheezing.  1 Inhaler  2  . aspirin 325 MG EC tablet Take 1 tablet (325 mg total) by mouth every morning.  30 tablet  0  . Choline Fenofibrate (TRILIPIX) 135 MG capsule Take 135 mg by mouth daily.      [name] gabapentin (NEURONTIN) 400 MG capsule Take 1 capsule (400 mg total) by mouth 3 (three) times daily.  90 capsule  3  . insulin glargine (LANTUS SOLOSTAR) 100 UNIT/ML injection Inject 54 Units into the skin at bedtime.  10 mL  0  . lisinopril (PRINIVIL,ZESTRIL) 2.5 MG tablet Take 2.5 mg by mouth daily.      . metFORMIN (GLUCOPHAGE) 1000 MG tablet Take 1 tablet (1,000 mg total) by mouth 2 (two) times daily with a meal. HOLD for 48 hours, restart on April 18th.      . metoprolol succinate (TOPROL-XL) 25 MG 24 hr tablet Take 1 tablet (25 mg total) by mouth daily.  30 tablet  12  . mometasone-formoterol (DULERA) 100-5 MCG/ACT AERO Inhale 2 puffs into the lungs 2 (two) times daily.  1 Inhaler  0  . nitroGLYCERIN (NITROSTAT) 0.4 MG SL tablet Place 1 tablet (0.4 mg total) under the tongue every 5 (five) minutes as needed. For chest pain.  25 tablet  3  . omeprazole (PRILOSEC) 40 MG capsule Take 40 mg by  mouth daily.      . pioglitazone (ACTOS) 15 MG tablet Take 1 tablet (15 mg total) by mouth daily.  30 tablet  0  . prasugrel (EFFIENT) 10 MG TABS Take 1 tablet (10 mg total) by mouth daily.  30 tablet  12  . rosuvastatin (CRESTOR) 10 MG tablet Take 20 mg by mouth daily.      . sertraline (ZOLOFT) 50 MG tablet Take 1 tablet (50 mg total) by mouth daily.  30 tablet  0  . traZODone (DESYREL) 50 MG tablet Take 50 mg by mouth at bedtime.      . pantoprazole (PROTONIX) 40 MG tablet Take 1 tablet (40 mg total) by mouth daily.  30 tablet  0  . [DISCONTINUED] metoprolol tartrate (LOPRESSOR) 25 MG tablet Take 25 mg by mouth 2 (two) times daily.          Discontinued Meds:    Medications Discontinued During This Encounter  Medication Reason  . glipiZIDE (GLUCOTROL) 5 MG tablet Error  . gemfibrozil (LOPID) 600 MG tablet Error  . rosuvastatin (CRESTOR) 10 MG tablet     Patient Active  Problem List  Diagnosis  . CAD (coronary artery disease), native coronary artery  . Hypercholesterolemia  . Obesity (BMI 30-39.9)  . LBBB (left bundle branch block)  . Insulin dependent type 2 diabetes mellitus, controlled  . Chest pain  . Tobacco abuse  . GERD (gastroesophageal reflux disease)  . Esophagitis  . LUQ pain  . Early satiety  . Esophageal dysphagia  . Hyperlipidemia  . Depression  . Coronary artery disease  . HTN (hypertension)  . Obesity  . Anxiety  . Numbness of foot  . Major depression, chronic    LABS Admission on 08/10/2012, Discharged on 08/13/2012  Component Date Value  . Glucose-Capillary 08/10/2012 214*  . Comment 1 08/10/2012 Notify RN   . Glucose-Capillary 08/11/2012 186*  . TSH 08/12/2012 1.622   . Free T4 08/12/2012 1.44   . Glucose-Capillary 08/11/2012 300*  . Comment 1 08/11/2012 Documented in Chart   . Comment 2 08/11/2012 Notify RN   . Glucose-Capillary 08/12/2012 195*  . Comment 1 08/12/2012 Notify RN   . Glucose-Capillary 08/12/2012 227*  . Glucose-Capillary  08/12/2012 357*  . Glucose-Capillary 08/12/2012 305*  . Glucose-Capillary 08/13/2012 252*  . Glucose-Capillary 08/13/2012 272*  . Comment 1 08/13/2012 Notify RN   Admission on 08/09/2012, Discharged on 08/10/2012  Component Date Value  . WBC 08/09/2012 7.5   . RBC 08/09/2012 4.73   . Hemoglobin 08/09/2012 15.1   . HCT 08/09/2012 44.1   . MCV 08/09/2012 93.2   . [facility] 08/09/2012 31.9   . MCHC 08/09/2012 34.2   . RDW 08/09/2012 12.7   . Platelets 08/09/2012 462*  . Neutrophils Relative 08/09/2012 52   . Neutro Abs 08/09/2012 3.9   . Lymphocytes Relative 08/09/2012 36   . Lymphs Abs 08/09/2012 2.7   . Monocytes Relative 08/09/2012 10   . Monocytes Absolute 08/09/2012 0.8   . Eosinophils Relative 08/09/2012 2   . Eosinophils Absolute 08/09/2012 0.1   . Basophils Relative 08/09/2012 0   . Basophils Absolute 08/09/2012 0.0   . Sodium 08/09/2012 134*  . Potassium 08/09/2012 4.2   . Chloride 08/09/2012 96   . CO2 08/09/2012 28   . Glucose, Bld 08/09/2012 128*  . BUN 08/09/2012 20   . Creatinine, Ser 08/09/2012 1.10   . Calcium 08/09/2012 9.8   . GFR calc non Af Amer 08/09/2012 78*  . GFR calc Af Amer 08/09/2012 >90   . Opiates 08/09/2012 NONE DETECTED   . Cocaine 08/09/2012 NONE DETECTED   . Benzodiazepines 08/09/2012 POSITIVE*  . Amphetamines 08/09/2012 NONE DETECTED   . Tetrahydrocannabinol 08/09/2012 NONE DETECTED   . Barbiturates 08/09/2012 NONE DETECTED   . Alcohol, Ethyl (B) 08/09/2012 <11   . Glucose-Capillary 08/09/2012 221*  . Comment 1 08/09/2012 Notify RN   . Glucose-Capillary 08/10/2012 88   . Glucose-Capillary 08/10/2012 221*  . Comment 1 08/10/2012 Documented in Chart   . Comment 2 08/10/2012 Notify RN   Admission on 08/03/2012, Discharged on 08/05/2012  Component Date Value  . WBC 08/03/2012 8.9   . RBC 08/03/2012 3.87*  . Hemoglobin 08/03/2012 12.2*  . HCT 08/03/2012 36.4*  . MCV 08/03/2012 94.1   . [facility] 08/03/2012 31.5   . MCHC 08/03/2012 33.5   . RDW  08/03/2012 13.2   . Platelets 08/03/2012 366   . Neutrophils Relative 08/03/2012 57   . Neutro Abs 08/03/2012 5.0   . Lymphocytes Relative 08/03/2012 30   . Lymphs Abs 08/03/2012 2.7   . Monocytes Relative 08/03/2012 11   .  Monocytes Absolute 08/03/2012 1.0   . Eosinophils Relative 08/03/2012 2   . Eosinophils Absolute 08/03/2012 0.2   . Basophils Relative 08/03/2012 0   . Basophils Absolute 08/03/2012 0.0   . Sodium 08/03/2012 136   . Potassium 08/03/2012 3.9   . Chloride 08/03/2012 101   . CO2 08/03/2012 26   . Glucose, Bld 08/03/2012 161*  . BUN 08/03/2012 18   . Creatinine, Ser 08/03/2012 1.01   . Calcium 08/03/2012 8.9   . GFR calc non Af Amer 08/03/2012 87*  . GFR calc Af Amer 08/03/2012 >90   . Pro B Natriuretic peptid* 08/03/2012 400.3*  . Troponin i, poc 08/03/2012 0.02   . Comment 3 08/03/2012          . Sodium 08/04/2012 137   . Potassium 08/04/2012 4.3   . Chloride 08/04/2012 99   . CO2 08/04/2012 29   . Glucose, Bld 08/04/2012 117*  . BUN 08/04/2012 17   . Creatinine, Ser 08/04/2012 1.05   . Calcium 08/04/2012 9.1   . GFR calc non Af Amer 08/04/2012 83*  . GFR calc Af Amer 08/04/2012 >90   . WBC 08/04/2012 8.2   . RBC 08/04/2012 4.00*  . Hemoglobin 08/04/2012 12.5*  . HCT 08/04/2012 37.6*  . MCV 08/04/2012 94.0   . [facility] 08/04/2012 31.3   . MCHC 08/04/2012 33.2   . RDW 08/04/2012 13.2   . Platelets 08/04/2012 387   . Troponin I 08/04/2012 <0.30   . Troponin I 08/04/2012 <0.30   . Troponin I 08/04/2012 <0.30   . Hemoglobin A1C 08/04/2012 7.9*  . Mean Plasma Glucose 08/04/2012 180*  . MRSA by PCR 08/04/2012 NEGATIVE   . Total Protein 08/04/2012 6.9   . Albumin 08/04/2012 3.6   . AST 08/04/2012 18   . ALT 08/04/2012 15   . Alkaline Phosphatase 08/04/2012 37*  . Total Bilirubin 08/04/2012 0.3   . Bilirubin, Direct 08/04/2012 <0.1   . Indirect Bilirubin 08/04/2012 NOT CALCULATED   . Glucose-Capillary 08/04/2012 138*  . Glucose-Capillary 08/05/2012 129*   . Comment 1 08/05/2012 Notify RN   . Glucose-Capillary 08/05/2012 144*  . Glucose-Capillary 08/05/2012 205*  . Glucose-Capillary 08/04/2012 183*  . Glucose-Capillary 08/04/2012 135*  . Comment 1 08/04/2012 Documented in Chart   . Comment 2 08/04/2012 Notify RN   . Glucose-Capillary 08/04/2012 108*  . Comment 1 08/04/2012 Documented in Chart   . Comment 2 08/04/2012 Notify RN   . Glucose-Capillary 08/04/2012 140*  Admission on 07/28/2012, Discharged on 07/28/2012  Component Date Value  . Sodium 07/28/2012 136   . Potassium 07/28/2012 3.7   . Chloride 07/28/2012 101   . CO2 07/28/2012 28   . Glucose, Bld 07/28/2012 97   . BUN 07/28/2012 19   . Creatinine, Ser 07/28/2012 0.96   . Calcium 07/28/2012 9.3   . GFR calc non Af Amer 07/28/2012 >90   . GFR calc Af Amer 07/28/2012 >90   . WBC 07/28/2012 7.1   . RBC 07/28/2012 4.01*  . Hemoglobin 07/28/2012 13.0   . HCT 07/28/2012 37.8*  . MCV 07/28/2012 94.3   . MCH 07/28/2012 32.4   . MCHC 07/28/2012 34.4   . RDW 07/28/2012 13.4   . Platelets 07/28/2012 385   . Neutrophils Relative 07/28/2012 48   . Neutro Abs 07/28/2012 3.4   . Lymphocytes Relative 07/28/2012 38   . Lymphs Abs 07/28/2012 2.7   . Monocytes Relative 07/28/2012 11   . Monocytes Absolute 07/28/2012 0.8   .  Eosinophils Relative 07/28/2012 2   . Eosinophils Absolute 07/28/2012 0.2   . Basophils Relative 07/28/2012 1   . Basophils Absolute 07/28/2012 0.0   . Troponin I 07/28/2012 <0.30   Admission on 07/02/2012, Discharged on 07/05/2012  Component Date Value  . Troponin I 07/02/2012 <0.30   . WBC 07/02/2012 5.8   . RBC 07/02/2012 4.33   . Hemoglobin 07/02/2012 13.9   . HCT 07/02/2012 40.5   . MCV 07/02/2012 93.5   . [facility] 07/02/2012 32.1   . MCHC 07/02/2012 34.3   . RDW 07/02/2012 13.0   . Platelets 07/02/2012 368   . Neutrophils Relative 07/02/2012 54   . Neutro Abs 07/02/2012 3.2   . Lymphocytes Relative 07/02/2012 34   . Lymphs Abs 07/02/2012 2.0   .  Monocytes Relative 07/02/2012 9   . Monocytes Absolute 07/02/2012 0.5   . Eosinophils Relative 07/02/2012 2   . Eosinophils Absolute 07/02/2012 0.1   . Basophils Relative 07/02/2012 1   . Basophils Absolute 07/02/2012 0.0   . Sodium 07/02/2012 134*  . Potassium 07/02/2012 4.4   . Chloride 07/02/2012 99   . CO2 07/02/2012 25   . Glucose, Bld 07/02/2012 304*  . BUN 07/02/2012 25*  . Creatinine, Ser 07/02/2012 1.27   . Calcium 07/02/2012 9.6   . Total Protein 07/02/2012 6.9   . Albumin 07/02/2012 4.0   . AST 07/02/2012 11   . ALT 07/02/2012 12   . Alkaline Phosphatase 07/02/2012 46   . Total Bilirubin 07/02/2012 0.2*  . GFR calc non Af Amer 07/02/2012 66*  . GFR calc Af Amer 07/02/2012 76*  . Prothrombin Time 07/02/2012 11.9   . INR 07/02/2012 0.88   . aPTT 07/02/2012 30   . Pro B Natriuretic peptid* 07/02/2012 146.7*  . D-Dimer, Quant 07/02/2012 <0.27   . Troponin i, poc 07/02/2012 0.00   . Comment 3 07/02/2012          . Troponin I 07/03/2012 <0.30   . Troponin I 07/03/2012 <0.30   . Troponin I 07/03/2012 <0.30   . WBC 07/03/2012 6.9   . RBC 07/03/2012 4.26   . Hemoglobin 07/03/2012 13.6   . HCT 07/03/2012 39.9   . MCV 07/03/2012 93.7   . [facility] 07/03/2012 31.9   . MCHC 07/03/2012 34.1   . RDW 07/03/2012 13.0   . Platelets 07/03/2012 339   . Sodium 07/03/2012 139   . Potassium 07/03/2012 3.7   . Chloride 07/03/2012 104   . CO2 07/03/2012 27   . Glucose, Bld 07/03/2012 144*  . BUN 07/03/2012 19   . Creatinine, Ser 07/03/2012 0.98   . Calcium 07/03/2012 9.2   . Total Protein 07/03/2012 6.6   . Albumin 07/03/2012 3.8   . AST 07/03/2012 11   . ALT 07/03/2012 11   . Alkaline Phosphatase 07/03/2012 37*  . Total Bilirubin 07/03/2012 0.2*  . GFR calc non Af Amer 07/03/2012 >90   . GFR calc Af Amer 07/03/2012 >90   . Hemoglobin A1C 07/03/2012 8.8*  . Mean Plasma Glucose 07/03/2012 206*  . Glucose-Capillary 07/03/2012 137*  . Heparin Unfractionated 07/03/2012 0.16*  .  Glucose-Capillary 07/03/2012 219*  . Heparin Unfractionated 07/03/2012 0.27*  . Heparin Unfractionated 07/04/2012 0.45   . WBC 07/04/2012 6.6   . RBC 07/04/2012 4.55   . Hemoglobin 07/04/2012 14.4   . HCT 07/04/2012 42.3   . MCV 07/04/2012 93.0   . [facility] 07/04/2012 31.6   . MCHC 07/04/2012 34.0   .  RDW 07/04/2012 12.9   . Platelets 07/04/2012 364   . Glucose-Capillary 07/03/2012 109*  . Glucose-Capillary 07/03/2012 239*  . Glucose-Capillary 07/04/2012 99   . Glucose-Capillary 07/04/2012 163*  . Comment 1 07/04/2012 Notify RN   . WBC 07/05/2012 7.0   . RBC 07/05/2012 4.57   . Hemoglobin 07/05/2012 14.7   . HCT 07/05/2012 42.3   . MCV 07/05/2012 92.6   . [facility] 07/05/2012 32.2   . MCHC 07/05/2012 34.8   . RDW 07/05/2012 12.8   . Platelets 07/05/2012 379   . Glucose-Capillary 07/04/2012 187*  . Comment 1 07/04/2012 Notify RN   . Glucose-Capillary 07/05/2012 148*     Results for this Opt Visit:     Results for orders placed during the hospital encounter of 08/10/12  GLUCOSE, CAPILLARY      Component Value Range   Glucose-Capillary 214 (*) 70 - 99 mg/dL   Comment 1 Notify RN    GLUCOSE, CAPILLARY      Component Value Range   Glucose-Capillary 186 (*) 70 - 99 mg/dL  TSH      Component Value Range   TSH 1.622  0.350 - 4.500 uIU/mL  T4, FREE      Component Value Range   Free T4 1.44  0.80 - 1.80 ng/dL  GLUCOSE, CAPILLARY      Component Value Range   Glucose-Capillary 300 (*) 70 - 99 mg/dL   Comment 1 Documented in Chart     Comment 2 Notify RN    GLUCOSE, CAPILLARY      Component Value Range   Glucose-Capillary 195 (*) 70 - 99 mg/dL   Comment 1 Notify RN    GLUCOSE, CAPILLARY      Component Value Range   Glucose-Capillary 227 (*) 70 - 99 mg/dL  GLUCOSE, CAPILLARY      Component Value Range   Glucose-Capillary 357 (*) 70 - 99 mg/dL  GLUCOSE, CAPILLARY      Component Value Range   Glucose-Capillary 305 (*) 70 - 99 mg/dL  GLUCOSE, CAPILLARY      Component Value Range    Glucose-Capillary 252 (*) 70 - 99 mg/dL  GLUCOSE, CAPILLARY      Component Value Range   Glucose-Capillary 272 (*) 70 - 99 mg/dL   Comment 1 Notify RN      EKG Orders placed during the hospital encounter of 08/03/12  . EKG 12-LEAD  . EKG 12-LEAD  . EKG     Prior Assessment and Plan Problem List as of 09/13/2012          CAD (coronary artery disease), native coronary artery   Last Assessment & Plan Note   03/14/2012 Office Visit Signed 03/14/2012 11:55 AM by Jodelle Gross, NP    Recent admission for noncardiac chest pain,. He is having abdominal discomfort on the upper left quadrant. I will refer to GI for further evaluation with remote history of cancer. He will continue current medications. Will see in 6 months.    Hypercholesterolemia   Last Assessment & Plan Note   03/14/2012 Office Visit Signed 03/14/2012 11:56 AM by Jodelle Gross, NP    Considered stopping crestor if musculoskeletal pain continued,but it has subsided. Only abdominal pain. He will follow-up with lipids and LFTs in 3 months.    Obesity (BMI 30-39.9)   LBBB (left bundle branch block)   Insulin dependent type 2 diabetes mellitus, controlled   Last Assessment & Plan Note   02/06/2012 Office Visit Signed 02/06/2012 11:48 AM  by Jodelle Gross, NP    He is advised to see provider at health dept for better diabetes management with Hgb, AIC at 9.1 during admission.    Chest pain   Tobacco abuse   Last Assessment & Plan Note   03/14/2012 Office Visit Signed 03/14/2012 11:57 AM by Jodelle Gross, NP    He was in remission, but has unfortunately started back. He is requesting nicotene patches. I have provided Rx for 21mg  for 2 weeks and then 14 mg for two weeks, and then stop. He is advised to quit ASAP. He verbalizes understanding.    GERD (gastroesophageal reflux disease)   Last Assessment & Plan Note   04/03/2012 Office Visit Signed 04/03/2012 12:46 PM by Tiffany Kocher, PA    Refractory GERD, early satiety,  poor appetite. Nocturnal regurgitation and esophageal dysphagia. Stop Pepcid. Start Dexilant. Omeprazole/nexium with potential drug interaction with SSRIs.Samples of Dexilant provided. Will plan on EGD/ED in near future. We will contact cardiology to see if ok to hold Effient and how long need to hold for reversal of effects. We will schedule EGD/ED once this information obtained.     Esophagitis   LUQ pain   Last Assessment & Plan Note   04/03/2012 Office Visit Signed 04/03/2012 12:49 PM by Tiffany Kocher, PA    LUQ pain. No improvement on naproxyn. He had melanoma removed from the area where is had the pain and also some lymph node dissection of left axilla. This was about six years ago. CT ok. Doubt pancreatitis based on minimally elevated lipase and clinical symptoms. Doubt symptoms due to GERD/PUD but can evaluate at time of EGD. Repeat LFTs/lipase. I suspect musculoskeletal component. May ultimately need thoracic spine films to look for evidence of space narrowing etc. Vicodin given for severe pain only. Further recommendations to follow.  As aside, I have encouraged patient to developed relationship with PCP given he now has Medicaid. He plans to make appt in near future.     Early satiety   Esophageal dysphagia   Hyperlipidemia   Depression   Coronary artery disease   HTN (hypertension)   Obesity   Anxiety   Numbness of foot   Major depression, chronic       Imaging: No results found.   FRS Calculation: Score not calculated. Missing: Total Cholesterol

## 2012-09-13 NOTE — Progress Notes (Signed)
HPI: [name] is a 46 y/o patient of Dr.Rothbart we are seeing for ongoing assessment and treatment of CAD with BMS to ramus in 10/2009. He is followed by the Health Dept in [place] for drug assistance and medical management of diabetes, depression/anxiety. Most recent cath completed in April of  2013 demonstrated nonobstructive LAD stenosis, but LCx ramus had a hazy 70-80% in-stent restenosis, but was treated medically because of the risks of compromising the LAD and large diagonal.     He has since been admitted to [facility] the beginning of November 2013 for chest pressure related to anxiety and depression with suicidal etiology. He has not refilled Effient for one month. He denies recurrent chest discomfort and is stable in psychological status at present per his report.   No Known Allergies  Current Outpatient Prescriptions  Medication Sig Dispense Refill  . albuterol (PROVENTIL HFA;VENTOLIN HFA) 108 (90 BASE) MCG/ACT inhaler Inhale 2 puffs into the lungs every 6 (six) hours as needed for wheezing.  1 Inhaler  2  . aspirin 325 MG EC tablet Take 1 tablet (325 mg total) by mouth every morning.  30 tablet  0  . Choline Fenofibrate (TRILIPIX) 135 MG capsule Take 135 mg by mouth daily.      [name] gabapentin (NEURONTIN) 400 MG capsule Take 1 capsule (400 mg total) by mouth 3 (three) times daily.  90 capsule  3  . insulin glargine (LANTUS SOLOSTAR) 100 UNIT/ML injection Inject 54 Units into the skin at bedtime.  10 mL  0  . lisinopril (PRINIVIL,ZESTRIL) 2.5 MG tablet Take 2.5 mg by mouth daily.      . metFORMIN (GLUCOPHAGE) 1000 MG tablet Take 1 tablet (1,000 mg total) by mouth 2 (two) times daily with a meal. HOLD for 48 hours, restart on April 18th.      . metoprolol succinate (TOPROL-XL) 25 MG 24 hr tablet Take 1 tablet (25 mg total) by mouth daily.  30 tablet  6  . mometasone-formoterol (DULERA) 100-5 MCG/ACT AERO Inhale 2 puffs into the lungs 2 (two) times daily.  1 Inhaler  0  .  nitroGLYCERIN (NITROSTAT) 0.4 MG SL tablet Place 1 tablet (0.4 mg total) under the tongue every 5 (five) minutes as needed. For chest pain.  25 tablet  3  . omeprazole (PRILOSEC) 40 MG capsule Take 40 mg by mouth daily.      . pioglitazone (ACTOS) 15 MG tablet Take 1 tablet (15 mg total) by mouth daily.  30 tablet  0  . prasugrel (EFFIENT) 10 MG TABS Take 1 tablet (10 mg total) by mouth daily.  30 tablet  6  . rosuvastatin (CRESTOR) 10 MG tablet Take 20 mg by mouth daily.      . sertraline (ZOLOFT) 50 MG tablet Take 1 tablet (50 mg total) by mouth daily.  30 tablet  0  . traZODone (DESYREL) 50 MG tablet Take 50 mg by mouth at bedtime.      . [DISCONTINUED] metoprolol succinate (TOPROL-XL) 25 MG 24 hr tablet Take 1 tablet (25 mg total) by mouth daily.  30 tablet  12  . pantoprazole (PROTONIX) 40 MG tablet Take 1 tablet (40 mg total) by mouth daily.  30 tablet  0  . [DISCONTINUED] metoprolol tartrate (LOPRESSOR) 25 MG tablet Take 25 mg by mouth 2 (two) times daily.          Past Medical History  Diagnosis Date  . Diabetes mellitus     Type 2  . Hyperlipidemia   .  Depression   . Coronary artery disease     s/p BMS to Ramus 10/2010;  Cath 01/22/12 prox 30-40% LAD, LCx ramus w/ hazy 70-80% in-stent restenosis, EF 60% treated medically  . HTN (hypertension)   . Tobacco abuse   . Obesity   . Anxiety   . Melanoma 2007    surgery at [facility]  . Myocardial infarction 2012  . GERD (gastroesophageal reflux disease)   . Hyperlipidemia   . No pertinent past medical history   . Asthma     Past Surgical History  Procedure Date  . Melanoma surgery 2007    [facility], removed lymph nodes under arm as well  . Cardiac catheterization     with stent  . Esophagogastroduodenoscopy 04/24/2012    Procedure: ESOPHAGOGASTRODUODENOSCOPY (EGD);  Surgeon: Corbin Ade, MD;  Location: [facility];  Service: Endoscopy;  Laterality: N/A;    ZOX:[id number] of systems complete and found to be negative unless listed  above  PHYSICAL EXAM BP 130/72  Pulse 79  Ht 6\' 2"  (1.88 m)  Wt 227 lb (102.967 kg)  BMI 29.15 kg/m2  SpO2 96%  General: Well developed, well nourished, in no acute distress Head: Eyes PERRLA, Bilateral  xanthomas.   Normal cephalic and atramatic  Lungs: Bibasilar crackles, with inspiratory wheezes.  Heart: HRRR S1 S2, without MRG.  Pulses are 2+ & equal.            No carotid bruit. No JVD.  No abdominal bruits. No femoral bruits. Abdomen: Bowel sounds are positive, abdomen soft and non-tender without masses or                  Hernia's noted. Msk:  Back normal, normal gait. Normal strength and tone for age. Extremities: No clubbing, cyanosis or edema.  DP +1 Neuro: Alert and oriented X 3. Psych:  Good affect, responds appropriately    ASSESSMENT AND PLAN

## 2012-09-13 NOTE — Assessment & Plan Note (Signed)
He is without recurrent complaint of angina. He has been out of Effient since November 2013.  I have given him refills on Effient and metoprolol. He is advised to quit smoking. We will see him in the office in 6 months unless he is symptomatic. Continued medical therapy of CAD is planned per recent cardiac cath in April of 2013.

## 2012-09-14 ENCOUNTER — Other Ambulatory Visit: Payer: Self-pay | Admitting: Gastroenterology

## 2012-09-14 LAB — CBC WITH DIFFERENTIAL/PLATELET
Basophils Absolute: 0.1 10*3/uL (ref 0.0–0.1)
Basophils Relative: 1 % (ref 0–1)
Eosinophils Relative: 2 % (ref 0–5)
HCT: 44.5 % (ref 39.0–52.0)
Hemoglobin: 15.6 g/dL (ref 13.0–17.0)
MCH: 30.7 pg (ref 26.0–34.0)
MCHC: 35.1 g/dL (ref 30.0–36.0)
MCV: 87.6 fL (ref 78.0–100.0)
Monocytes Absolute: 0.7 10*3/uL (ref 0.1–1.0)
Monocytes Relative: 10 % (ref 3–12)
RDW: 13.9 % (ref 11.5–15.5)

## 2012-09-14 LAB — BASIC METABOLIC PANEL
CO2: 29 mEq/L (ref 19–32)
Chloride: 103 mEq/L (ref 96–112)
Glucose, Bld: 68 mg/dL — ABNORMAL LOW (ref 70–99)
Potassium: 4.7 mEq/L (ref 3.5–5.3)
Sodium: 140 mEq/L (ref 135–145)

## 2012-09-14 LAB — HEPATIC FUNCTION PANEL
AST: 14 U/L (ref 0–37)
Albumin: 4.9 g/dL (ref 3.5–5.2)
Bilirubin, Direct: 0.1 mg/dL (ref 0.0–0.3)
Total Bilirubin: 0.4 mg/dL (ref 0.3–1.2)

## 2012-09-14 LAB — LIPID PANEL
LDL Cholesterol: 83 mg/dL (ref 0–99)
Total CHOL/HDL Ratio: 3.1 Ratio
VLDL: 19 mg/dL (ref 0–40)

## 2012-09-16 ENCOUNTER — Encounter: Payer: Self-pay | Admitting: *Deleted

## 2012-09-23 ENCOUNTER — Other Ambulatory Visit: Payer: Self-pay

## 2012-09-23 DIAGNOSIS — D649 Anemia, unspecified: Secondary | ICD-10-CM

## 2012-09-23 NOTE — Progress Notes (Signed)
LMOM for pt to call. Lab order on file for 12/31/2012. Routing to she, Darl Pikes to schedule next OV.

## 2012-09-23 NOTE — Progress Notes (Signed)
Pt returned call and was informed.  

## 2012-09-23 NOTE — Progress Notes (Signed)
Reminder in epic to follow up in 6 months with SF and labs to be done in 3 to 4 months

## 2012-09-23 NOTE — Progress Notes (Signed)
Faxed to PCP

## 2012-09-27 ENCOUNTER — Telehealth: Payer: Self-pay | Admitting: *Deleted

## 2012-09-27 DIAGNOSIS — F17200 Nicotine dependence, unspecified, uncomplicated: Secondary | ICD-10-CM

## 2012-09-27 DIAGNOSIS — I2510 Atherosclerotic heart disease of native coronary artery without angina pectoris: Secondary | ICD-10-CM

## 2012-09-27 DIAGNOSIS — E7800 Pure hypercholesterolemia, unspecified: Secondary | ICD-10-CM

## 2012-09-27 MED ORDER — PRASUGREL HCL 10 MG PO TABS
10.0000 mg | ORAL_TABLET | Freq: Every day | ORAL | Status: DC
Start: 2012-09-27 — End: 2013-03-12

## 2012-09-27 NOTE — Telephone Encounter (Signed)
Called for PA for pt Effient 10mg  to [facility] for pt refill, called and spoke to rep [facility] and was advised the medication has been approved from 09-16-12 through 09-16-13, advised to re-send pt medication refill in for the pt, sent via escribe

## 2012-10-10 ENCOUNTER — Telehealth: Payer: Self-pay | Admitting: Adult Health

## 2012-10-10 NOTE — Telephone Encounter (Signed)
Please advise 

## 2012-10-10 NOTE — Telephone Encounter (Signed)
They need to know if he needs to be pre-medicated for dental procedures

## 2012-10-11 NOTE — Telephone Encounter (Signed)
Spoke to pt to advise results/instructions. Pt understood.  

## 2012-10-11 NOTE — Telephone Encounter (Signed)
No antibiotics needed prior to dental procedure

## 2012-10-11 NOTE — Telephone Encounter (Signed)
.  left message to have patient return my call.  

## 2012-11-23 ENCOUNTER — Other Ambulatory Visit: Payer: Self-pay

## 2012-11-23 ENCOUNTER — Encounter (HOSPITAL_COMMUNITY): Payer: Self-pay | Admitting: Emergency Medicine

## 2012-11-23 ENCOUNTER — Emergency Department (HOSPITAL_COMMUNITY)
Admission: EM | Admit: 2012-11-23 | Discharge: 2012-11-23 | Disposition: A | Discharge status: Home / Self Care | Payer: Medicaid Other | Attending: Emergency Medicine | Admitting: Emergency Medicine

## 2012-11-23 DIAGNOSIS — F17200 Nicotine dependence, unspecified, uncomplicated: Secondary | ICD-10-CM | POA: Insufficient documentation

## 2012-11-23 DIAGNOSIS — I10 Essential (primary) hypertension: Secondary | ICD-10-CM | POA: Insufficient documentation

## 2012-11-23 DIAGNOSIS — R112 Nausea with vomiting, unspecified: Secondary | ICD-10-CM

## 2012-11-23 DIAGNOSIS — Z85828 Personal history of other malignant neoplasm of skin: Secondary | ICD-10-CM | POA: Insufficient documentation

## 2012-11-23 DIAGNOSIS — E1169 Type 2 diabetes mellitus with other specified complication: Secondary | ICD-10-CM | POA: Insufficient documentation

## 2012-11-23 DIAGNOSIS — I252 Old myocardial infarction: Secondary | ICD-10-CM | POA: Insufficient documentation

## 2012-11-23 DIAGNOSIS — E860 Dehydration: Secondary | ICD-10-CM

## 2012-11-23 DIAGNOSIS — E669 Obesity, unspecified: Secondary | ICD-10-CM | POA: Insufficient documentation

## 2012-11-23 DIAGNOSIS — E785 Hyperlipidemia, unspecified: Secondary | ICD-10-CM | POA: Insufficient documentation

## 2012-11-23 DIAGNOSIS — F3289 Other specified depressive episodes: Secondary | ICD-10-CM | POA: Insufficient documentation

## 2012-11-23 DIAGNOSIS — J45909 Unspecified asthma, uncomplicated: Secondary | ICD-10-CM | POA: Insufficient documentation

## 2012-11-23 DIAGNOSIS — Z79899 Other long term (current) drug therapy: Secondary | ICD-10-CM | POA: Insufficient documentation

## 2012-11-23 DIAGNOSIS — K219 Gastro-esophageal reflux disease without esophagitis: Secondary | ICD-10-CM | POA: Insufficient documentation

## 2012-11-23 DIAGNOSIS — R739 Hyperglycemia, unspecified: Secondary | ICD-10-CM

## 2012-11-23 DIAGNOSIS — F411 Generalized anxiety disorder: Secondary | ICD-10-CM | POA: Insufficient documentation

## 2012-11-23 DIAGNOSIS — Z7982 Long term (current) use of aspirin: Secondary | ICD-10-CM | POA: Insufficient documentation

## 2012-11-23 DIAGNOSIS — F329 Major depressive disorder, single episode, unspecified: Secondary | ICD-10-CM | POA: Insufficient documentation

## 2012-11-23 DIAGNOSIS — Z794 Long term (current) use of insulin: Secondary | ICD-10-CM | POA: Insufficient documentation

## 2012-11-23 DIAGNOSIS — I2510 Atherosclerotic heart disease of native coronary artery without angina pectoris: Secondary | ICD-10-CM | POA: Insufficient documentation

## 2012-11-23 LAB — URINALYSIS, ROUTINE W REFLEX MICROSCOPIC
Bilirubin Urine: NEGATIVE
Glucose, UA: 500 mg/dL — AB
Ketones, ur: NEGATIVE mg/dL
Leukocytes, UA: NEGATIVE
Protein, ur: NEGATIVE mg/dL
pH: 5.5 (ref 5.0–8.0)

## 2012-11-23 LAB — BASIC METABOLIC PANEL
BUN: 15 mg/dL (ref 6–23)
Calcium: 9.5 mg/dL (ref 8.4–10.5)
Chloride: 98 mEq/L (ref 96–112)
Creatinine, Ser: 1.13 mg/dL (ref 0.50–1.35)
GFR calc Af Amer: 88 mL/min — ABNORMAL LOW (ref >90)
GFR calc non Af Amer: 76 mL/min — ABNORMAL LOW (ref >90)

## 2012-11-23 LAB — CBC WITH DIFFERENTIAL/PLATELET
Basophils Absolute: 0 10*3/uL (ref 0.0–0.1)
Basophils Relative: 0 % (ref 0–1)
Eosinophils Absolute: 0.1 10*3/uL (ref 0.0–0.7)
Eosinophils Relative: 2 % (ref 0–5)
HCT: 42.6 % (ref 39.0–52.0)
Hemoglobin: 14.5 g/dL (ref 13.0–17.0)
MCH: 31.6 pg (ref 26.0–34.0)
MCHC: 34 g/dL (ref 30.0–36.0)
MCV: 92.8 fL (ref 78.0–100.0)
Monocytes Absolute: 0.7 10*3/uL (ref 0.1–1.0)
Monocytes Relative: 9 % (ref 3–12)
RDW: 14.4 % (ref 11.5–15.5)

## 2012-11-23 LAB — TROPONIN I: Troponin I: <0.3 ng/mL (ref <0.30)

## 2012-11-23 MED ORDER — ONDANSETRON 4 MG PO TBDP: 4.0000 mg | ORAL_TABLET | Freq: Three times a day (TID) | ORAL | Status: DC | PRN

## 2012-11-23 MED ORDER — SODIUM CHLORIDE 0.9 % IV BOLUS (SEPSIS)
1000.0000 mL | Freq: Once | INTRAVENOUS | Status: AC
  Administered 2012-11-23: 1000 mL via INTRAVENOUS

## 2012-11-23 MED ORDER — NAPROXEN 250 MG PO TABS
500.0000 mg | ORAL_TABLET | Freq: Once | ORAL | Status: AC
  Administered 2012-11-23: 500 mg via ORAL
  Filled 2012-11-23: qty 2

## 2012-11-23 MED ORDER — PROMETHAZINE HCL 25 MG PO TABS: 25.0000 mg | ORAL_TABLET | Freq: Four times a day (QID) | ORAL | Status: DC | PRN

## 2012-11-23 MED ORDER — NAPROXEN 500 MG PO TABS: 500.0000 mg | ORAL_TABLET | Freq: Two times a day (BID) | ORAL | Status: DC

## 2012-11-23 MED ORDER — ONDANSETRON HCL 4 MG/2ML IJ SOLN
4.0000 mg | Freq: Once | INTRAMUSCULAR | Status: AC
  Administered 2012-11-23: 4 mg via INTRAVENOUS
  Filled 2012-11-23: qty 2

## 2012-11-23 NOTE — ED Notes (Addendum)
Pt c/o n/v/sweating/chills and burning with urination since last night.

## 2012-11-23 NOTE — ED Notes (Signed)
Pt with c/o pain with urination starting today, states feels hot and cold, no fever noted while here, also c/o itching, 160/101 with BP monitor at home per pt

## 2012-11-23 NOTE — ED Provider Notes (Signed)
History     CSN: [id number]  Arrival date & time 11/23/12  1633   First MD Initiated Contact with Patient 11/23/12 1852      Chief Complaint  Patient presents with  . Emesis    (Consider location/radiation/quality/duration/timing/severity/associated sxs/prior treatment) HPI Comments: 47 year old male with history of diabetes, hypertension and cardiac disease who presents with a complaint of nausea and vomiting. He states that last night immediately after eating dinner which was soup he developed nausea and vomited 8 times over the course of the evening and today. The vomitus appears his stomach contents, it is not associated with diarrhea but he does have some abdominal discomfort after vomiting. He feels that this is just his abdominal wall pain straight from the emesis. He also complains of having dysuria which has been going on today. There has been no diarrhea, no blood in his stools, no significant coughing but he does have chest pain when he vomits. He has no headache, no blurred vision, he feels like his mouth is very dry but he has been checking his blood sugars today and they have been around 150. Nothing seems to make this better or worse, symptoms are persistent throughout the day.  Patient is a 47 y.o. male presenting with vomiting. The history is provided by the patient and medical records.  Emesis   Past Medical History  Diagnosis Date  . Diabetes mellitus     Type 2  . Hyperlipidemia   . Depression   . Coronary artery disease     s/p BMS to Ramus 10/2010;  Cath 01/22/12 prox 30-40% LAD, LCx ramus w/ hazy 70-80% in-stent restenosis, EF 60% treated medically  . HTN (hypertension)   . Tobacco abuse   . Obesity   . Anxiety   . Melanoma 2007    surgery at [facility]  . Myocardial infarction 2012  . GERD (gastroesophageal reflux disease)   . Hyperlipidemia   . No pertinent past medical history   . Asthma     Past Surgical History  Procedure Laterality Date  . Melanoma  surgery  2007    [facility], removed lymph nodes under arm as well  . Cardiac catheterization      with stent  . Esophagogastroduodenoscopy  04/24/2012    Procedure: ESOPHAGOGASTRODUODENOSCOPY (EGD);  Surgeon: Corbin Ade, MD;  Location: [facility];  Service: Endoscopy;  Laterality: N/A;    Family History  Problem Relation Age of Onset  . Lung cancer    . Heart disease    . Other      not real familiar with family history    History  Substance Use Topics  . Smoking status: Current Every Day Smoker -- 1.00 packs/day for 30 years    Types: Cigarettes  . Smokeless tobacco: Never Used  . 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  All other systems reviewed and are negative.    Allergies  Review of patient's allergies indicates no known allergies.  Home Medications   Current Outpatient Rx  Name  Route  Sig  Dispense  Refill  . albuterol (PROVENTIL HFA;VENTOLIN HFA) 108 (90 BASE) MCG/ACT inhaler   Inhalation   Inhale 2 puffs into the lungs every 6 (six) hours as needed for wheezing.   1 Inhaler   2   . albuterol (PROVENTIL) (2.5 MG/3ML) 0.083% nebulizer solution   Nebulization   Take 2.5  mg by nebulization 2 (two) times daily as needed for wheezing or shortness of breath.         [name] aspirin 325 MG EC tablet   Oral   Take 1 tablet (325 mg total) by mouth every morning.   30 tablet   0   . Choline Fenofibrate (TRILIPIX) 135 MG capsule   Oral   Take 135 mg by mouth daily.         [name] gabapentin (NEURONTIN) 400 MG capsule   Oral   Take 1 capsule (400 mg total) by mouth 3 (three) times daily.   90 capsule   3   . insulin glargine (LANTUS SOLOSTAR) 100 UNIT/ML injection   Subcutaneous   Inject 54 Units into the skin at bedtime.   10 mL   0   . lisinopril (PRINIVIL,ZESTRIL) 2.5 MG tablet   Oral   Take 2.5 mg by mouth daily.         . metFORMIN (GLUCOPHAGE) 1000 MG  tablet   Oral   Take 1 tablet (1,000 mg total) by mouth 2 (two) times daily with a meal. HOLD for 48 hours, restart on April 18th.         . metoprolol succinate (TOPROL-XL) 25 MG 24 hr tablet   Oral   Take 1 tablet (25 mg total) by mouth daily.   30 tablet   6   . mometasone-formoterol (DULERA) 100-5 MCG/ACT AERO   Inhalation   Inhale 2 puffs into the lungs 2 (two) times daily.   1 Inhaler   0   . omeprazole (PRILOSEC) 40 MG capsule   Oral   Take 40 mg by mouth daily.         . pioglitazone (ACTOS) 15 MG tablet   Oral   Take 1 tablet (15 mg total) by mouth daily.   30 tablet   0   . prasugrel (EFFIENT) 10 MG TABS   Oral   Take 1 tablet (10 mg total) by mouth daily.   30 tablet   6   . rosuvastatin (CRESTOR) 20 MG tablet   Oral   Take 20 mg by mouth daily.         . sertraline (ZOLOFT) 50 MG tablet   Oral   Take 1 tablet (50 mg total) by mouth daily.   30 tablet   0   . traZODone (DESYREL) 50 MG tablet   Oral   Take 50 mg by mouth at bedtime.         . naproxen (NAPROSYN) 500 MG tablet   Oral   Take 1 tablet (500 mg total) by mouth 2 (two) times daily with a meal.   30 tablet   0   . nitroGLYCERIN (NITROSTAT) 0.4 MG SL tablet   Sublingual   Place 1 tablet (0.4 mg total) under the tongue every 5 (five) minutes as needed. For chest pain.   25 tablet   3   . ondansetron (ZOFRAN ODT) 4 MG disintegrating tablet   Oral   Take 1 tablet (4 mg total) by mouth every 8 (eight) hours as needed for nausea.   10 tablet   0   . promethazine (PHENERGAN) 25 MG tablet   Oral   Take 1 tablet (25 mg total) by mouth every 6 (six) hours as needed for nausea.   12 tablet   0     BP 129/87  Pulse 83  Temp(Src) 98.3 F (36.8 C) (Oral)  Resp 20  Ht 6\' 2"  (1.88 m)  Wt 227 lb (102.967 kg)  BMI 29.13 kg/m2  SpO2 97%  Physical Exam  Nursing note and vitals reviewed. Constitutional: He appears well-developed and well-nourished. No distress.  HENT:  Head:  Normocephalic and atraumatic.  Mouth/Throat: No oropharyngeal exudate.  Mucous membranes dehydrated  Eyes: Conjunctivae and EOM are normal. Pupils are equal, round, and reactive to light. Right eye exhibits no discharge. Left eye exhibits no discharge. No scleral icterus.  Neck: Normal range of motion. Neck supple. No JVD present. No thyromegaly present.  Cardiovascular: Normal rate, regular rhythm, normal heart sounds and intact distal pulses.  Exam reveals no gallop and no friction rub.   No murmur heard. Pulmonary/Chest: Effort normal and breath sounds normal. No respiratory distress. He has no wheezes. He has no rales.  Abdominal: Soft. Bowel sounds are normal. He exhibits no distension and no mass. There is tenderness ( mild epigastric tenderness, no pain at McBurney's point, no peritoneal signs).  Musculoskeletal: Normal range of motion. He exhibits no edema and no tenderness.  Lymphadenopathy:    He has no cervical adenopathy.  Neurological: He is alert. Coordination normal.  Skin: Skin is warm and dry. No rash noted. No erythema.  Psychiatric: He has a normal mood and affect. His behavior is normal.    ED Course  Procedures (including critical care time)  Labs Reviewed  URINALYSIS, ROUTINE W REFLEX MICROSCOPIC - Abnormal; Notable for the following:    Specific Gravity, Urine >1.030 (*)    Glucose, UA 500 (*)    All other components within normal limits  BASIC METABOLIC PANEL - Abnormal; Notable for the following:    Glucose, Bld 198 (*)    GFR calc non Af Amer 76 (*)    GFR calc Af Amer 88 (*)    All other components within normal limits  CBC WITH DIFFERENTIAL  TROPONIN I   No results found.   1. Nausea and vomiting   2. Hyperglycemia   3. Dehydration       MDM  Nonspecific nausea and vomiting, rule out cardiac source, urinary tract infection, evaluate labs and EKG. Zofran given for symptoms.  ED ECG REPORT  I personally interpreted this EKG   Date: 11/23/2012    Rate: 81  Rhythm: normal sinus rhythm  QRS Axis: normal  Intervals: QRS widened  ST/T Wave abnormalities: nonspecific T wave changes  Conduction Disutrbances:left bundle branch block  Narrative Interpretation:   Old EKG Reviewed: c/w 08/03/12, no sig changes  Trop negative, fluids given, now asymptomatic, no other obvious source of sx given normal labs other than glucose ~200.  Home with zofran, phenergan, naprosyn (toothache), f/u on Monday  Vida Roller, MD 11/23/12 2002

## 2012-11-24 ENCOUNTER — Observation Stay (HOSPITAL_COMMUNITY): Payer: Medicaid Other

## 2012-11-24 ENCOUNTER — Encounter (HOSPITAL_COMMUNITY): Payer: Self-pay | Admitting: Emergency Medicine

## 2012-11-24 ENCOUNTER — Observation Stay (HOSPITAL_COMMUNITY)
Admission: EM | Admit: 2012-11-24 | Discharge: 2012-11-26 | Disposition: A | Discharge status: Home / Self Care | Payer: Medicaid Other | Attending: Internal Medicine | Admitting: Internal Medicine

## 2012-11-24 DIAGNOSIS — F419 Anxiety disorder, unspecified: Secondary | ICD-10-CM | POA: Diagnosis present

## 2012-11-24 DIAGNOSIS — C439 Malignant melanoma of skin, unspecified: Secondary | ICD-10-CM

## 2012-11-24 DIAGNOSIS — K922 Gastrointestinal hemorrhage, unspecified: Secondary | ICD-10-CM | POA: Insufficient documentation

## 2012-11-24 DIAGNOSIS — Z720 Tobacco use: Secondary | ICD-10-CM

## 2012-11-24 DIAGNOSIS — R9431 Abnormal electrocardiogram [ECG] [EKG]: Secondary | ICD-10-CM | POA: Diagnosis present

## 2012-11-24 DIAGNOSIS — R42 Dizziness and giddiness: Secondary | ICD-10-CM | POA: Insufficient documentation

## 2012-11-24 DIAGNOSIS — E669 Obesity, unspecified: Secondary | ICD-10-CM

## 2012-11-24 DIAGNOSIS — R112 Nausea with vomiting, unspecified: Secondary | ICD-10-CM

## 2012-11-24 DIAGNOSIS — I10 Essential (primary) hypertension: Secondary | ICD-10-CM | POA: Insufficient documentation

## 2012-11-24 DIAGNOSIS — I447 Left bundle-branch block, unspecified: Secondary | ICD-10-CM

## 2012-11-24 DIAGNOSIS — E785 Hyperlipidemia, unspecified: Secondary | ICD-10-CM | POA: Diagnosis present

## 2012-11-24 DIAGNOSIS — F17200 Nicotine dependence, unspecified, uncomplicated: Secondary | ICD-10-CM | POA: Insufficient documentation

## 2012-11-24 DIAGNOSIS — E7800 Pure hypercholesterolemia, unspecified: Secondary | ICD-10-CM

## 2012-11-24 DIAGNOSIS — G629 Polyneuropathy, unspecified: Secondary | ICD-10-CM

## 2012-11-24 DIAGNOSIS — E119 Type 2 diabetes mellitus without complications: Secondary | ICD-10-CM | POA: Insufficient documentation

## 2012-11-24 DIAGNOSIS — K219 Gastro-esophageal reflux disease without esophagitis: Secondary | ICD-10-CM | POA: Insufficient documentation

## 2012-11-24 DIAGNOSIS — R079 Chest pain, unspecified: Principal | ICD-10-CM | POA: Insufficient documentation

## 2012-11-24 DIAGNOSIS — E860 Dehydration: Secondary | ICD-10-CM

## 2012-11-24 DIAGNOSIS — I2510 Atherosclerotic heart disease of native coronary artery without angina pectoris: Secondary | ICD-10-CM | POA: Insufficient documentation

## 2012-11-24 DIAGNOSIS — F329 Major depressive disorder, single episode, unspecified: Secondary | ICD-10-CM | POA: Diagnosis present

## 2012-11-24 HISTORY — DX: Major depressive disorder, single episode, unspecified: F32.9

## 2012-11-24 LAB — URINALYSIS, ROUTINE W REFLEX MICROSCOPIC
Bilirubin Urine: NEGATIVE
Hgb urine dipstick: NEGATIVE
Nitrite: NEGATIVE
Specific Gravity, Urine: >1.03 — ABNORMAL HIGH (ref 1.005–1.030)
pH: 6 (ref 5.0–8.0)

## 2012-11-24 LAB — COMPREHENSIVE METABOLIC PANEL
ALT: 12 U/L (ref 0–53)
AST: 10 U/L (ref 0–37)
Albumin: 3.7 g/dL (ref 3.5–5.2)
Calcium: 8.5 mg/dL (ref 8.4–10.5)
GFR calc Af Amer: 88 mL/min — ABNORMAL LOW (ref >90)
Sodium: 139 mEq/L (ref 135–145)
Total Protein: 6.3 g/dL (ref 6.0–8.3)

## 2012-11-24 LAB — CBC WITH DIFFERENTIAL/PLATELET
Basophils Absolute: 0 10*3/uL (ref 0.0–0.1)
Eosinophils Absolute: 0.1 10*3/uL (ref 0.0–0.7)
Eosinophils Relative: 1 % (ref 0–5)
MCH: 31.4 pg (ref 26.0–34.0)
MCV: 93.1 fL (ref 78.0–100.0)
Platelets: 338 10*3/uL (ref 150–400)
RDW: 14.5 % (ref 11.5–15.5)
WBC: 8 10*3/uL (ref 4.0–10.5)

## 2012-11-24 LAB — PRO B NATRIURETIC PEPTIDE: Pro B Natriuretic peptide (BNP): 227.9 pg/mL — ABNORMAL HIGH (ref 0–125)

## 2012-11-24 MED ORDER — GABAPENTIN 400 MG PO CAPS
400.0000 mg | ORAL_CAPSULE | Freq: Three times a day (TID) | ORAL | Status: DC
  Administered 2012-11-25 – 2012-11-26 (×5): 400 mg via ORAL
  Filled 2012-11-24 (×5): qty 1

## 2012-11-24 MED ORDER — ONDANSETRON HCL 4 MG/2ML IJ SOLN
4.0000 mg | Freq: Once | INTRAMUSCULAR | Status: AC
  Administered 2012-11-24: 4 mg via INTRAVENOUS
  Filled 2012-11-24: qty 2

## 2012-11-24 MED ORDER — METOPROLOL SUCCINATE ER 25 MG PO TB24
25.0000 mg | ORAL_TABLET | Freq: Every day | ORAL | Status: DC
  Administered 2012-11-25: 25 mg via ORAL
  Filled 2012-11-24: qty 1

## 2012-11-24 MED ORDER — PRASUGREL HCL 10 MG PO TABS
10.0000 mg | ORAL_TABLET | Freq: Every day | ORAL | Status: DC
  Administered 2012-11-25: 10 mg via ORAL
  Filled 2012-11-24 (×3): qty 1

## 2012-11-24 MED ORDER — GI COCKTAIL ~~LOC~~
30.0000 mL | Freq: Once | ORAL | Status: AC
  Administered 2012-11-24: 30 mL via ORAL
  Filled 2012-11-24: qty 30

## 2012-11-24 MED ORDER — MOMETASONE FURO-FORMOTEROL FUM 100-5 MCG/ACT IN AERO
2.0000 | INHALATION_SPRAY | Freq: Two times a day (BID) | RESPIRATORY_TRACT | Status: DC
  Administered 2012-11-25 – 2012-11-26 (×3): 2 via RESPIRATORY_TRACT
  Filled 2012-11-24: qty 8.8

## 2012-11-24 MED ORDER — SODIUM CHLORIDE 0.9 % IV BOLUS (SEPSIS)
1000.0000 mL | Freq: Once | INTRAVENOUS | Status: AC
  Administered 2012-11-24: 1000 mL via INTRAVENOUS

## 2012-11-24 MED ORDER — ACETAMINOPHEN 650 MG RE SUPP: 650.0000 mg | Freq: Four times a day (QID) | RECTAL | Status: DC | PRN

## 2012-11-24 MED ORDER — IPRATROPIUM BROMIDE 0.02 % IN SOLN: 0.5000 mg | Freq: Four times a day (QID) | RESPIRATORY_TRACT | Status: DC

## 2012-11-24 MED ORDER — INSULIN ASPART 100 UNIT/ML ~~LOC~~ SOLN
0.0000 [IU] | Freq: Three times a day (TID) | SUBCUTANEOUS | Status: DC
  Administered 2012-11-25: 3 [IU] via SUBCUTANEOUS
  Administered 2012-11-25: 2 [IU] via SUBCUTANEOUS

## 2012-11-24 MED ORDER — ALBUTEROL SULFATE HFA 108 (90 BASE) MCG/ACT IN AERS: 2.0000 | INHALATION_SPRAY | Freq: Four times a day (QID) | RESPIRATORY_TRACT | Status: DC | PRN

## 2012-11-24 MED ORDER — ALBUTEROL SULFATE (5 MG/ML) 0.5% IN NEBU: 2.5000 mg | INHALATION_SOLUTION | RESPIRATORY_TRACT | Status: DC | PRN

## 2012-11-24 MED ORDER — ATORVASTATIN CALCIUM 40 MG PO TABS
40.0000 mg | ORAL_TABLET | Freq: Every day | ORAL | Status: DC
  Administered 2012-11-25: 40 mg via ORAL
  Filled 2012-11-24: qty 1

## 2012-11-24 MED ORDER — ENOXAPARIN SODIUM 40 MG/0.4ML ~~LOC~~ SOLN
40.0000 mg | SUBCUTANEOUS | Status: DC
  Administered 2012-11-25: 40 mg via SUBCUTANEOUS
  Filled 2012-11-24: qty 0.4

## 2012-11-24 MED ORDER — HYDROCODONE-ACETAMINOPHEN 5-325 MG PO TABS: 1.0000 | ORAL_TABLET | ORAL | Status: DC | PRN

## 2012-11-24 MED ORDER — ACETAMINOPHEN 325 MG PO TABS: 650.0000 mg | ORAL_TABLET | Freq: Four times a day (QID) | ORAL | Status: DC | PRN

## 2012-11-24 MED ORDER — METOCLOPRAMIDE HCL 5 MG/ML IJ SOLN: 10.0000 mg | Freq: Four times a day (QID) | INTRAMUSCULAR | Status: DC | PRN

## 2012-11-24 MED ORDER — TRAZODONE HCL 50 MG PO TABS
50.0000 mg | ORAL_TABLET | Freq: Every day | ORAL | Status: DC
  Administered 2012-11-24 – 2012-11-25 (×2): 50 mg via ORAL
  Filled 2012-11-24 (×2): qty 1

## 2012-11-24 MED ORDER — GI COCKTAIL ~~LOC~~: 30.0000 mL | Freq: Two times a day (BID) | ORAL | Status: DC | PRN

## 2012-11-24 MED ORDER — SODIUM CHLORIDE 0.9 % IJ SOLN
3.0000 mL | Freq: Two times a day (BID) | INTRAMUSCULAR | Status: DC
  Administered 2012-11-25 (×2): 3 mL via INTRAVENOUS

## 2012-11-24 MED ORDER — SENNOSIDES-DOCUSATE SODIUM 8.6-50 MG PO TABS: 1.0000 | ORAL_TABLET | Freq: Every evening | ORAL | Status: DC | PRN

## 2012-11-24 MED ORDER — INSULIN GLARGINE 100 UNIT/ML ~~LOC~~ SOLN
25.0000 [IU] | Freq: Every day | SUBCUTANEOUS | Status: DC
  Administered 2012-11-25 (×2): 25 [IU] via SUBCUTANEOUS

## 2012-11-24 MED ORDER — SERTRALINE HCL 50 MG PO TABS
50.0000 mg | ORAL_TABLET | Freq: Every day | ORAL | Status: DC
  Administered 2012-11-25 – 2012-11-26 (×2): 50 mg via ORAL
  Filled 2012-11-24 (×2): qty 1

## 2012-11-24 MED ORDER — FENOFIBRATE 160 MG PO TABS
160.0000 mg | ORAL_TABLET | Freq: Every day | ORAL | Status: DC
  Administered 2012-11-25 – 2012-11-26 (×2): 160 mg via ORAL
  Filled 2012-11-24 (×3): qty 1

## 2012-11-24 MED ORDER — SODIUM CHLORIDE 0.9 % IV SOLN
INTRAVENOUS | Status: DC
  Administered 2012-11-24: 125 mL/h via INTRAVENOUS
  Administered 2012-11-25 – 2012-11-26 (×4): via INTRAVENOUS

## 2012-11-24 MED ORDER — THIAMINE HCL 100 MG/ML IJ SOLN
Freq: Once | INTRAVENOUS | Status: AC
  Administered 2012-11-25: 01:00:00 via INTRAVENOUS
  Filled 2012-11-24: qty 1000

## 2012-11-24 MED ORDER — PANTOPRAZOLE SODIUM 40 MG IV SOLR
40.0000 mg | Freq: Two times a day (BID) | INTRAVENOUS | Status: DC
  Administered 2012-11-25 – 2012-11-26 (×4): 40 mg via INTRAVENOUS
  Filled 2012-11-24 (×4): qty 40

## 2012-11-24 MED ORDER — ASPIRIN EC 325 MG PO TBEC
325.0000 mg | DELAYED_RELEASE_TABLET | Freq: Every morning | ORAL | Status: DC
  Administered 2012-11-25: 325 mg via ORAL
  Filled 2012-11-24: qty 1

## 2012-11-24 NOTE — ED Notes (Signed)
Dr Phillips Odor in to speak with patient.  He will be admitted.  Repeat EKG ordered and completed

## 2012-11-24 NOTE — ED Notes (Signed)
Per EMS pt was seen here yesterday for chest pain that began on friday but was d/c with dx of dehydration and high blood sugar. Pt reports chest pain has gotten worse. Pt alert and oriented. CBG en route to ED was 176. Pt reports taking one 325 mg aspirin,one SL nitro prior to arrival with minimal relief. Pt denies SOB,n/v/d. nad noted at this time.

## 2012-11-24 NOTE — ED Notes (Signed)
Per Dr. Rondel Baton request - orthostatic v/s done. Patient denies any dizziness upon sitting or standing.

## 2012-11-24 NOTE — ED Provider Notes (Signed)
History    Dustin Cruz is a 47 y.o. male who returns to ED after discharge 24 hours ago with complaints of increasing lightheadedness.  He states that his vomiting from the previous visit has resolved, but he feels weak and dizzy when he tries to get up.  He also complains of urinary burning.  He says that he has sharp pain in his left chest area when he bends or leans his upper torso.  He states that he has rehydrated since yesterday with several glasses of gatorade and tea.  He states that his blood sugar this am was in the 90's and it was a little elevated after lunch but does not recall the reading.  He continues to smoke 1/2 Morones per day.  He denies swelling of lower extremities, SOB, fever, night sweats, NVD, abdominal pain, back pain, reflux, shoulder pain, jaw pain.  He admits to chills and "hot flashes."     CSN: [id number]  Arrival date & time 11/24/12  1644   First MD Initiated Contact with Patient 11/24/12 1705      Chief Complaint  Patient presents with  . Chest Pain    (Consider location/radiation/quality/duration/timing/severity/associated sxs/prior treatment) HPI  Past Medical History  Diagnosis Date  . Diabetes mellitus     Type 2  . Hyperlipidemia   . Depression   . Coronary artery disease     s/p BMS to Ramus 10/2010;  Cath 01/22/12 prox 30-40% LAD, LCx ramus w/ hazy 70-80% in-stent restenosis, EF 60% treated medically  . HTN (hypertension)   . Tobacco abuse   . Obesity   . Anxiety   . Melanoma 2007    surgery at [facility]  . Myocardial infarction 2012  . GERD (gastroesophageal reflux disease)   . Hyperlipidemia   . No pertinent past medical history   . Asthma     Past Surgical History  Procedure Laterality Date  . Melanoma surgery  2007    [facility], removed lymph nodes under arm as well  . Cardiac catheterization      with stent  . Esophagogastroduodenoscopy  04/24/2012    Procedure: ESOPHAGOGASTRODUODENOSCOPY (EGD);  Surgeon: Corbin Ade, MD;  Location:  [facility];  Service: Endoscopy;  Laterality: N/A;    Family History  Problem Relation Age of Onset  . Lung cancer    . Heart disease    . Other      not real familiar with family history    History  Substance Use Topics  . Smoking status: Current Every Day Smoker -- 1.00 packs/day for 30 years    Types: Cigarettes  . Smokeless tobacco: Never Used  . 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 Negative for fever and diaphoresis.  HENT: Positive for mouth sores and dental problem. Negative for congestion, rhinorrhea and neck pain.   Eyes: Negative for visual disturbance.  Respiratory: Negative for cough, chest tightness, shortness of breath and wheezing.   Cardiovascular: Positive for chest pain. Negative for palpitations and leg swelling.  Gastrointestinal: Negative for nausea, vomiting, abdominal pain, diarrhea and constipation.  Genitourinary: Positive for dysuria.  Musculoskeletal: Negative for myalgias and back pain.  Neurological: Positive for weakness and light-headedness. Negative for headaches.    Allergies  Review of patient's allergies indicates no known allergies.  Home Medications   Current Outpatient Rx  Name  Route  Sig  Dispense  Refill  . albuterol (PROVENTIL HFA;VENTOLIN HFA) 108 (90 BASE) MCG/ACT inhaler   Inhalation   Inhale 2 puffs into the lungs every 6 (six) hours as needed for wheezing.   1 Inhaler   2   . albuterol (PROVENTIL) (2.5 MG/3ML) 0.083% nebulizer solution   Nebulization   Take 2.5 mg by nebulization 2 (two) times daily as needed for wheezing or shortness of breath.         [name] aspirin 325 MG EC tablet   Oral   Take 1 tablet (325 mg total) by mouth every morning.   30 tablet   0   . Choline Fenofibrate (TRILIPIX) 135 MG capsule   Oral   Take 135 mg by mouth daily.         [name] gabapentin (NEURONTIN) 400 MG capsule   Oral   Take 1 capsule  (400 mg total) by mouth 3 (three) times daily.   90 capsule   3   . insulin glargine (LANTUS SOLOSTAR) 100 UNIT/ML injection   Subcutaneous   Inject 54 Units into the skin at bedtime.   10 mL   0   . lisinopril (PRINIVIL,ZESTRIL) 2.5 MG tablet   Oral   Take 2.5 mg by mouth daily.         . metFORMIN (GLUCOPHAGE) 1000 MG tablet   Oral   Take 1 tablet (1,000 mg total) by mouth 2 (two) times daily with a meal. HOLD for 48 hours, restart on April 18th.         . metoprolol succinate (TOPROL-XL) 25 MG 24 hr tablet   Oral   Take 1 tablet (25 mg total) by mouth daily.   30 tablet   6   . mometasone-formoterol (DULERA) 100-5 MCG/ACT AERO   Inhalation   Inhale 2 puffs into the lungs 2 (two) times daily.   1 Inhaler   0   . naproxen (NAPROSYN) 500 MG tablet   Oral   Take 1 tablet (500 mg total) by mouth 2 (two) times daily with a meal.   30 tablet   0   . omeprazole (PRILOSEC) 40 MG capsule   Oral   Take 40 mg by mouth daily.         . pioglitazone (ACTOS) 15 MG tablet   Oral   Take 1 tablet (15 mg total) by mouth daily.   30 tablet   0   . prasugrel (EFFIENT) 10 MG TABS   Oral   Take 1 tablet (10 mg total) by mouth daily.   30 tablet   6   . rosuvastatin (CRESTOR) 20 MG tablet   Oral   Take 20 mg by mouth daily.         . sertraline (ZOLOFT) 50 MG tablet   Oral   Take 1 tablet (50 mg total) by mouth daily.   30 tablet   0   . traZODone (DESYREL) 50 MG tablet   Oral   Take 50 mg by mouth at bedtime.         . nitroGLYCERIN (NITROSTAT) 0.4 MG SL tablet   Sublingual   Place 1 tablet (0.4 mg total) under the tongue every 5 (five) minutes as needed. For chest pain.   25 tablet   3   . ondansetron (ZOFRAN ODT) 4 MG disintegrating tablet   Oral   Take 1 tablet (4 mg total) by mouth every 8 (eight) hours as needed for nausea.   10 tablet   0   .  promethazine (PHENERGAN) 25 MG tablet   Oral   Take 1 tablet (25 mg total) by mouth every 6 (six)  hours as needed for nausea.   12 tablet   0     BP 124/64  Pulse 80  Resp 20  Ht 6\' 2"  (1.88 m)  Wt 230 lb (104.327 kg)  BMI 29.52 kg/m2  SpO2 97%  Physical Exam  Constitutional: He is oriented to person, place, and time. He appears well-developed and well-nourished. No distress.  HENT:  Head: Normocephalic and atraumatic.  Eyes: Conjunctivae and EOM are normal. Pupils are equal, round, and reactive to light.  Neck: Normal range of motion.  Cardiovascular: Normal rate and regular rhythm.   Pulmonary/Chest: Effort normal. He has rales.  Bilateral lower lobes   Abdominal: Soft. Bowel sounds are normal. There is no rebound and no guarding.  Tenderness epigastric area.  cva tenderness right side.  Neurological: He is alert and oriented to person, place, and time.  Skin: Skin is warm and dry. He is not diaphoretic.    ED Course  Procedures (including critical care time)  Labs Reviewed  CBC WITH DIFFERENTIAL - Abnormal; Notable for the following:    RBC 4.20 (*)    All other components within normal limits  COMPREHENSIVE METABOLIC PANEL - Abnormal; Notable for the following:    Glucose, Bld 138 (*)    Alkaline Phosphatase 32 (*)    Total Bilirubin 0.2 (*)    GFR calc non Af Amer 76 (*)    GFR calc Af Amer 88 (*)    All other components within normal limits  URINALYSIS, ROUTINE W REFLEX MICROSCOPIC - Abnormal; Notable for the following:    Specific Gravity, Urine >1.030 (*)    Glucose, UA 100 (*)    All other components within normal limits  TROPONIN I   No results found. Results for orders placed during the hospital encounter of 11/24/12  TROPONIN I      Result Value Range   Troponin I <0.30  <0.30 ng/mL  CBC WITH DIFFERENTIAL      Result Value Range   WBC 8.0  4.0 - 10.5 K/uL   RBC 4.20 (*) 4.22 - 5.81 MIL/uL   Hemoglobin 13.2  13.0 - 17.0 g/dL   HCT 40.9  81.1 - 91.4 %   MCV 93.1  78.0 - 100.0 fL   MCH 31.4  26.0 - 34.0 pg   MCHC 33.8  30.0 - 36.0 g/dL   RDW  78.2  95.6 - 21.3 %   Platelets 338  150 - 400 K/uL   Neutrophils Relative 56  43 - 77 %   Neutro Abs 4.5  1.7 - 7.7 K/uL   Lymphocytes Relative 33  12 - 46 %   Lymphs Abs 2.7  0.7 - 4.0 K/uL   Monocytes Relative 9  3 - 12 %   Monocytes Absolute 0.7  0.1 - 1.0 K/uL   Eosinophils Relative 1  0 - 5 %   Eosinophils Absolute 0.1  0.0 - 0.7 K/uL   Basophils Relative 0  0 - 1 %   Basophils Absolute 0.0  0.0 - 0.1 K/uL  COMPREHENSIVE METABOLIC PANEL      Result Value Range   Sodium 139  135 - 145 mEq/L   Potassium 4.0  3.5 - 5.1 mEq/L   Chloride 104  96 - 112 mEq/L   CO2 26  19 - 32 mEq/L   Glucose, Bld 138 (*) 70 -  99 mg/dL   BUN 17  6 - 23 mg/dL   Creatinine, Ser 1.61  0.50 - 1.35 mg/dL   Calcium 8.5  8.4 - 09.6 mg/dL   Total Protein 6.3  6.0 - 8.3 g/dL   Albumin 3.7  3.5 - 5.2 g/dL   AST 10  0 - 37 U/L   ALT 12  0 - 53 U/L   Alkaline Phosphatase 32 (*) 39 - 117 U/L   Total Bilirubin 0.2 (*) 0.3 - 1.2 mg/dL   GFR calc non Af Amer 76 (*) >90 mL/min   GFR calc Af Amer 88 (*) >90 mL/min  URINALYSIS, ROUTINE W REFLEX MICROSCOPIC      Result Value Range   Color, Urine YELLOW  YELLOW   APPearance CLEAR  CLEAR   Specific Gravity, Urine >1.030 (*) 1.005 - 1.030   pH 6.0  5.0 - 8.0   Glucose, UA 100 (*) NEGATIVE mg/dL   Hgb urine dipstick NEGATIVE  NEGATIVE   Bilirubin Urine NEGATIVE  NEGATIVE   Ketones, ur NEGATIVE  NEGATIVE mg/dL   Protein, ur NEGATIVE  NEGATIVE mg/dL   Urobilinogen, UA 0.2  0.0 - 1.0 mg/dL   Nitrite NEGATIVE  NEGATIVE   Leukocytes, UA NEGATIVE  NEGATIVE     1. Light headed   2. Dehydration   3. hyperglycemia  MDM   Source of patient's lightheadedness and intermittent chest pain not found on initial testing.   Patient admitted for observation and further workup by hospitalist team.       Rockney Ghee [place] Condon, PA-C 11/24/12 2014

## 2012-11-24 NOTE — ED Provider Notes (Signed)
Pt presents after 24 hours from last ED visit - states that he has had ongoing light headedness, much worse with standing, feels diaphoretic and near syncopal when he stands.  He has mild CP which is short lived, intermittent and assocaited with some nasuea.  He denies leg swelling, sob, cough, fever.  He does have cardiac history with in stent restenosis several years ago - admitted last year with neg cardiac w/u shy of stress.  On exam he has normal lungs, soft systolic murmur, no edema and VS are normal - orthostatics pending, ECG with LBBB, unchanged, repeat trop  ED ECG REPORT  I personally interpreted this EKG   Date: 11/24/2012   Rate:   Rhythm: normal sinus rhythm  QRS Axis: normal  Intervals: QRS prolonged  ST/T Wave abnormalities: nonspecific ST/T changes  Conduction Disutrbances:left bundle branch block  Narrative Interpretation:   Old EKG Reviewed: unchanged    Medical screening examination/treatment/procedure(s) were conducted as a shared visit with non-physician practitioner(s) and myself.  I personally evaluated the patient during the encounter    Vida Roller, MD 11/25/12 1452

## 2012-11-25 ENCOUNTER — Encounter (HOSPITAL_COMMUNITY): Payer: Self-pay | Admitting: Internal Medicine

## 2012-11-25 ENCOUNTER — Observation Stay (HOSPITAL_COMMUNITY): Payer: Medicaid Other

## 2012-11-25 DIAGNOSIS — R112 Nausea with vomiting, unspecified: Secondary | ICD-10-CM | POA: Diagnosis present

## 2012-11-25 DIAGNOSIS — I10 Essential (primary) hypertension: Secondary | ICD-10-CM

## 2012-11-25 DIAGNOSIS — I2510 Atherosclerotic heart disease of native coronary artery without angina pectoris: Secondary | ICD-10-CM

## 2012-11-25 DIAGNOSIS — R1013 Epigastric pain: Secondary | ICD-10-CM

## 2012-11-25 DIAGNOSIS — D649 Anemia, unspecified: Secondary | ICD-10-CM

## 2012-11-25 DIAGNOSIS — R079 Chest pain, unspecified: Secondary | ICD-10-CM

## 2012-11-25 DIAGNOSIS — G629 Polyneuropathy, unspecified: Secondary | ICD-10-CM | POA: Diagnosis present

## 2012-11-25 DIAGNOSIS — K922 Gastrointestinal hemorrhage, unspecified: Secondary | ICD-10-CM

## 2012-11-25 DIAGNOSIS — R9431 Abnormal electrocardiogram [ECG] [EKG]: Secondary | ICD-10-CM

## 2012-11-25 DIAGNOSIS — K219 Gastro-esophageal reflux disease without esophagitis: Secondary | ICD-10-CM

## 2012-11-25 DIAGNOSIS — C439 Malignant melanoma of skin, unspecified: Secondary | ICD-10-CM | POA: Diagnosis present

## 2012-11-25 LAB — CBC
MCH: 31.4 pg (ref 26.0–34.0)
MCV: 93.6 fL (ref 78.0–100.0)
Platelets: 319 10*3/uL (ref 150–400)
RBC: 4.05 MIL/uL — ABNORMAL LOW (ref 4.22–5.81)
RDW: 14.4 % (ref 11.5–15.5)
WBC: 6.1 10*3/uL (ref 4.0–10.5)

## 2012-11-25 LAB — BASIC METABOLIC PANEL
Calcium: 8.6 mg/dL (ref 8.4–10.5)
Creatinine, Ser: 1.07 mg/dL (ref 0.50–1.35)
GFR calc non Af Amer: 81 mL/min — ABNORMAL LOW (ref >90)
Sodium: 141 mEq/L (ref 135–145)

## 2012-11-25 LAB — LIPID PANEL
HDL: 45 mg/dL (ref >39)
Triglycerides: 109 mg/dL (ref <150)

## 2012-11-25 LAB — GLUCOSE, CAPILLARY
Glucose-Capillary: 166 mg/dL — ABNORMAL HIGH (ref 70–99)
Glucose-Capillary: 183 mg/dL — ABNORMAL HIGH (ref 70–99)
Glucose-Capillary: 82 mg/dL (ref 70–99)
Glucose-Capillary: 91 mg/dL (ref 70–99)

## 2012-11-25 LAB — TROPONIN I
Troponin I: <0.3 ng/mL (ref <0.30)
Troponin I: <0.3 ng/mL (ref <0.30)

## 2012-11-25 LAB — HEMOGLOBIN A1C: Mean Plasma Glucose: 160 mg/dL — ABNORMAL HIGH (ref <117)

## 2012-11-25 MED ORDER — M.V.I. ADULT IV INJ
INJECTION | INTRAVENOUS | Status: AC
  Filled 2012-11-25: qty 10

## 2012-11-25 MED ORDER — FOLIC ACID 5 MG/ML IJ SOLN
INTRAMUSCULAR | Status: AC
  Filled 2012-11-25: qty 0.2

## 2012-11-25 MED ORDER — THIAMINE HCL 100 MG/ML IJ SOLN
INTRAMUSCULAR | Status: AC
  Filled 2012-11-25: qty 2

## 2012-11-25 MED ORDER — ASPIRIN EC 81 MG PO TBEC
81.0000 mg | DELAYED_RELEASE_TABLET | Freq: Every morning | ORAL | Status: DC
  Administered 2012-11-26: 81 mg via ORAL
  Filled 2012-11-25: qty 1

## 2012-11-25 MED ORDER — METOPROLOL SUCCINATE ER 25 MG PO TB24
25.0000 mg | ORAL_TABLET | Freq: Two times a day (BID) | ORAL | Status: DC
  Administered 2012-11-25 – 2012-11-26 (×2): 25 mg via ORAL
  Filled 2012-11-25 (×2): qty 1

## 2012-11-25 MED ORDER — PRASUGREL HCL 10 MG PO TABS
10.0000 mg | ORAL_TABLET | Freq: Every day | ORAL | Status: DC
  Filled 2012-11-25: qty 1

## 2012-11-25 NOTE — H&P (Signed)
[facility] History and Physical  Dustin Cruz ZOX:[id number] DOB: [birth year] DOA: 11/24/2012  Referring physician: Antonieta Loveless PCP: Tylene Fantasia., PA  Specialists: [facility]  Chief Complaint: Chest Pain, Nausea, Vomiting  HPI: Dustin Cruz is a 47 y.o. male with ASCVD status post PCI with BMS stenting to ostial ramus for STEMI in  2012, with subsequent in-stent restenosis in 01/2012, ongoing tobacco abuse, DM2,COPD and HTN who was evaluated in the ED yesterday for nausea/vomiting and dehydration who returned today with continued symptoms including dizziness, nausea and chest pain. He reports that the symptoms started started 2 days ago (Friday evening) after eating dinner and going outside in the cold with some of his friends to smoke a cigarette- he developed acute onset nausea, chest tightness, intense (shirt-soaking) diaphoresis and vomited. He reports having nausea and vomiting throughout Friday evening about 4-5 times with dull chest pain/epigastric pain. He then came to the ED for evaluation yesterday and was found to be dehydrated, was given IV fluids and anti-nausea meds and discharged. He returned this evening due to persistent nausea and chest pain and new dizziness, the CP persists in his epigastric area. The pain does not radiate and currently is about a 2/10, if he leans forward it becomes more sharp and is a "different pain". He complains of dyspnea with any exertion. He has been taking NSAIDS regularly for osteoarthritis and peripheral neuropathy leg pain despite having a diagnosis of severe gastritis/esophagitis diagnosed by EGD 04/2012 by Dr. Jena Gauss.  In the ED he continues to have low level discomfort in his epigastric/chest region, nausea and labwork suggests he is still dehydrated. He has not been able to keep down anything by mouth. His cardiac enzymes are negative, but he does have new TWI on 12 Lead EKG in the inferior leads II, III and AvF. Admission requested for  further evaluation and monitoring of his symptoms.  Review of Systems:  Review of Systems  Constitutional: Positive for diaphoresis. Negative for fever, chills, weight loss and malaise/fatigue.  HENT: Negative.  Negative for neck pain.   Eyes: Negative.   Respiratory: Positive for cough, sputum production and shortness of breath.   Cardiovascular: Positive for chest pain. Negative for palpitations, orthopnea, claudication, leg swelling and PND.  Gastrointestinal: Positive for nausea, vomiting and abdominal pain. Negative for heartburn, diarrhea, constipation, blood in stool and melena.  Genitourinary: Negative for dysuria, urgency, frequency, hematuria and flank pain.  Musculoskeletal: Positive for back pain and joint pain. Negative for myalgias and falls.  Skin: Negative.   Neurological: Positive for dizziness. Negative for tingling, tremors, sensory change, speech change, focal weakness, seizures, loss of consciousness and weakness.  Endo/Heme/Allergies: Negative.   Psychiatric/Behavioral: Positive for depression. Negative for suicidal ideas, hallucinations, memory loss and substance abuse. The patient is nervous/anxious and has insomnia.   All other systems reviewed and are negative.   Past Medical History  Diagnosis Date  . Diabetes mellitus     Type 2  . Hyperlipidemia   . Depression   . Coronary artery disease     s/p BMS to Ramus 10/2010;  Cath 01/22/12 prox 30-40% LAD, LCx ramus w/ hazy 70-80% in-stent restenosis, EF 60% treated medically  . HTN (hypertension)   . Tobacco abuse   . Obesity   . Anxiety   . Melanoma 2007    surgery at [facility]  . Myocardial infarction 2012  . GERD (gastroesophageal reflux disease)   . Hyperlipidemia   . No pertinent past medical history   .  Asthma    Past Surgical History  Procedure Laterality Date  . Melanoma surgery  2007    [facility], removed lymph nodes under arm as well  . Cardiac catheterization      with stent  .  Esophagogastroduodenoscopy  04/24/2012    Procedure: ESOPHAGOGASTRODUODENOSCOPY (EGD);  Surgeon: Corbin Ade, MD;  Location: [facility];  Service: Endoscopy;  Laterality: N/A;   Social History:  reports that he has been smoking Cigarettes.  He has a 15 Moncur-year smoking history. He has never used smokeless tobacco. He reports that he does not drink alcohol or use illicit drugs. Lives independently at home.   No Known Allergies  Family History  Problem Relation Age of Onset  . Lung cancer    . Heart disease    . Other      not real familiar with family history   Prior to Admission medications   Medication Sig Start Date End Date Taking? Authorizing Provider  albuterol (PROVENTIL HFA;VENTOLIN HFA) 108 (90 BASE) MCG/ACT inhaler Inhale 2 puffs into the lungs every 6 (six) hours as needed for wheezing. 08/13/12  Yes Norval Gable, NP  albuterol (PROVENTIL) (2.5 MG/3ML) 0.083% nebulizer solution Take 2.5 mg by nebulization 2 (two) times daily as needed for wheezing or shortness of breath.   Yes Historical Provider, MD  aspirin 325 MG EC tablet Take 1 tablet (325 mg total) by mouth every morning. 08/13/12  Yes Norval Gable, NP  Choline Fenofibrate (TRILIPIX) 135 MG capsule Take 135 mg by mouth daily.   Yes Historical Provider, MD  gabapentin (NEURONTIN) 400 MG capsule Take 1 capsule (400 mg total) by mouth 3 (three) times daily. 08/13/12  Yes Norval Gable, NP  insulin glargine (LANTUS SOLOSTAR) 100 UNIT/ML injection Inject 54 Units into the skin at bedtime. 08/13/12  Yes Norval Gable, NP  lisinopril (PRINIVIL,ZESTRIL) 2.5 MG tablet Take 2.5 mg by mouth daily.   Yes Historical Provider, MD  metFORMIN (GLUCOPHAGE) 1000 MG tablet Take 1 tablet (1,000 mg total) by mouth 2 (two) times daily with a meal. HOLD for 48 hours, restart on April 18th. 08/13/12  Yes Norval Gable, NP  metoprolol succinate (TOPROL-XL) 25 MG 24 hr tablet Take 1 tablet (25 mg total) by mouth daily. 09/13/12  Yes  Jodelle Gross, NP  mometasone-formoterol (DULERA) 100-5 MCG/ACT AERO Inhale 2 puffs into the lungs 2 (two) times daily. 08/13/12  Yes Norval Gable, NP  naproxen (NAPROSYN) 500 MG tablet Take 1 tablet (500 mg total) by mouth 2 (two) times daily with a meal. 11/23/12  Yes Vida Roller, MD  omeprazole (PRILOSEC) 40 MG capsule Take 40 mg by mouth daily.   Yes Historical Provider, MD  prasugrel (EFFIENT) 10 MG TABS Take 1 tablet (10 mg total) by mouth daily. 09/27/12  Yes Jodelle Gross, NP  rosuvastatin (CRESTOR) 20 MG tablet Take 20 mg by mouth daily.   Yes Historical Provider, MD  sertraline (ZOLOFT) 50 MG tablet Take 1 tablet (50 mg total) by mouth daily. 08/13/12  Yes Norval Gable, NP  traZODone (DESYREL) 50 MG tablet Take 50 mg by mouth at bedtime.   Yes Historical Provider, MD  nitroGLYCERIN (NITROSTAT) 0.4 MG SL tablet Place 1 tablet (0.4 mg total) under the tongue every 5 (five) minutes as needed. For chest pain. 08/13/12   Norval Gable, NP  ondansetron (ZOFRAN ODT) 4 MG disintegrating tablet Take 1 tablet (4 mg total) by mouth every 8 (eight) hours as needed for nausea. 11/23/12  Vida Roller, MD  promethazine (PHENERGAN) 25 MG tablet Take 1 tablet (25 mg total) by mouth every 6 (six) hours as needed for nausea. 11/23/12   Vida Roller, MD   Physical Exam: Filed Vitals:   11/24/12 2100 11/24/12 2130 11/24/12 2236 11/24/12 2320  BP: 134/73 134/75 143/79   Pulse: 78 76 78   Temp:   98.3 F (36.8 C)   TempSrc:   Oral   Resp: 11 14 16    Height:   6\' 2"  (1.88 m)   Weight:   105.779 kg (233 lb 3.2 oz)   SpO2: 96% 97% 98% 98%     General:  Ruddy complexion, mildly ill appearing, AOx3, cooperative  Eyes: normal  ENT: Dry, no thrush  Neck: no adenopathy or JVD  Cardiovascular: RRR, no mrg  Respiratory: scattered wheezes, good airmovement  Abdomen: point tenderness over epigastrum, otherwise normal  Skin: no suspicious lesions  Musculoskeletal: normal, no  swelling in his feet  Psychiatric: slightly anxious, not depressed  Neurologic: non-focal  Labs on Admission:  Basic Metabolic Panel:  Recent Labs Lab 11/23/12 1911 11/24/12 1738  NA 136 139  K 4.0 4.0  CL 98 104  CO2 27 26  GLUCOSE 198* 138*  BUN 15 17  CREATININE 1.13 1.13  CALCIUM 9.5 8.5   Liver Function Tests:  Recent Labs Lab 11/24/12 1738  AST 10  ALT 12  ALKPHOS 32*  BILITOT 0.2*  PROT 6.3  ALBUMIN 3.7    Recent Labs Lab 11/24/12 2145  LIPASE 15   No results found for this basename: AMMONIA,  in the last 168 hours CBC:  Recent Labs Lab 11/23/12 1911 11/24/12 1738  WBC 7.4 8.0  NEUTROABS 3.3 4.5  HGB 14.5 13.2  HCT 42.6 39.1  MCV 92.8 93.1  PLT 379 338   Cardiac Enzymes:  Recent Labs Lab 11/23/12 1911 11/24/12 1738 11/24/12 2145  TROPONINI <0.30 <0.30 <0.30    BNP (last 3 results)  Recent Labs  07/02/12 2028 08/03/12 2140 11/24/12 2145  PROBNP 146.7* 400.3* 227.9*   CBG: No results found for this basename: GLUCAP,  in the last 168 hours  Radiological Exams on Admission: X-ray Chest Pa And Lateral   11/24/2012  *RADIOLOGY REPORT*  Clinical Data: Nausea, vomiting, chest pain.  CHEST - 2 VIEW  Comparison: 08/03/2012  Findings: The cardiomediastinal contours are within normal range. Lungs are predominately clear.  No pleural effusion or pneumothorax.  No acute osseous finding.  IMPRESSION: No radiographic evidence of acute cardiopulmonary process.   Original Report Authenticated By: Jearld Lesch, M.D.     EKG: Independently reviewed. TWI in II, III, AvF, TWI in 6, old LBBB, frequent PVC and fusion complexes, biatrial enlargement  Assessment/Plan Patient Active Problem List  Diagnosis  . CAD (coronary artery disease), native coronary artery  . Hypercholesterolemia  . Obesity (BMI 30-39.9)  . LBBB (left bundle branch block)  . Insulin dependent type 2 diabetes mellitus, controlled  . Chest pain  . Tobacco abuse  . GERD  (gastroesophageal reflux disease)  . Hyperlipidemia  . Depression  . HTN (hypertension)  . Anxiety  . Abnormal EKG  . Peripheral neuropathy  . Nausea with vomiting  . Melanoma of skin, site unspecified    Dustin Cruz is a 47 year old gentleman with multiple risk factors for acute coronary syndrome and has known atherosclerotic cardiovascular disease with a STEMI 2 years ago and subsequent in-stent stenosis who continues to smoke cigarettes, is obese, has diabetes and  hypertension. His description of acute onset nausea, vomiting, diaphoresis and chest pain is concerning for an inferior MI /ischemia in light of his EKG which shows new T wave inversions in the inferior leads and has relative hypotension. While I have strong suspicion that this may be an anginal equivalent for an inferior MI, his pain is very localized in his epigastric region he has a history of gastritis and esophagitis and continues to use NSAIDs. He has been able to take little or nothing by mouth, is clinically dehydrated so he may also have subendocardial ischemia in the setting of volume depletion to account for the EKG findings and negative troponin. Admission is appropriate to rule out acute coronary syndrome, to continue IV fluids for dehydration, and potential workup for his persistent nausea vomiting and epigastric pain. Fortunately his lab work that is available is essentially normal including his Hb, BUN and Troponin.   Admit to telemetry for ACS rule out -cycling cardiac enzymes, 12-lead in a.m.  Full dose aspirin given, he is already on an antiplatelet (Effient)  Continued his metoprolol and held his ACE and diuretic until hydrated  IV fluid boluses given, ordered a BNP his chest x-ray does not show any signs of vascular congestion or pulmonary edema. We'll repeat 12-lead EKG to see if IV hydration improves his T wave inversions in the inferior leads. While his blood pressure is not by standard parameters low, he has not  taken any of his blood pressure medicines in 2 days so it would expect this to be higher.  Cardiology Consultation placed, he is followed by Dustin Cruz, he will need stress testing if not a possible cath given his hx of re-stenosis.  PPI for GERD, GI cocktail  Reglan for nausea  Also added on a Lipase to r/o pancreatitis (he has significant hypertrigs in past)   Code Status: Full Code Family Communication: Spoke with patient and his wife about treatment plans Disposition Plan: Admitted as OBS for chest pain r/o Time spent: 88  [facility] Pager [phone]  If 7PM-7AM, please contact night-coverage [email or web address] Password [facility] 11/25/2012, 12:43 AM

## 2012-11-25 NOTE — Progress Notes (Signed)
     Subjective: This man was admitted yesterday with apparent chest pain. However, he describes what appears to be coffee ground hematemesis a couple of days ago. His hemoglobin has decreased steadily in the last 3 days. He has no history of melena, epigastric pain. He is diabetic. He does take naproxen on a daily basis. There is no history of alcohol abuse.           Physical Exam: Blood pressure 115/61, pulse 74, temperature 97.6 F (36.4 C), temperature source Oral, resp. rate 16, height 6\' 2"  (1.88 m), weight 105.779 kg (233 lb 3.2 oz), SpO2 84.00%. He looks systemically well. Heart sounds are present and normal without gallop rhythm. Lung fields are clear. Abdomen is soft nontender. He is alert and oriented.   Investigations:     Basic Metabolic Panel:  Recent Labs  16/10/96 1738 11/25/12 0413  NA 139 141  K 4.0 4.1  CL 104 106  CO2 26 26  GLUCOSE 138* 98  BUN 17 17  CREATININE 1.13 1.07  CALCIUM 8.5 8.6   Liver Function Tests:  Recent Labs  11/24/12 1738  AST 10  ALT 12  ALKPHOS 32*  BILITOT 0.2*  PROT 6.3  ALBUMIN 3.7     CBC:  Recent Labs  11/23/12 1911 11/24/12 1738 11/25/12 0413  WBC 7.4 8.0 6.1  NEUTROABS 3.3 4.5  --   HGB 14.5 13.2 12.7*  HCT 42.6 39.1 37.9*  MCV 92.8 93.1 93.6  PLT 379 338 319    X-ray Chest Pa And Lateral   11/24/2012  *RADIOLOGY REPORT*  Clinical Data: Nausea, vomiting, chest pain.  CHEST - 2 VIEW  Comparison: 08/03/2012  Findings: The cardiomediastinal contours are within normal range. Lungs are predominately clear.  No pleural effusion or pneumothorax.  No acute osseous finding.  IMPRESSION: No radiographic evidence of acute cardiopulmonary process.   Original Report Authenticated By: Jearld Lesch, M.D.       Medications: I have reviewed the patient's current medications.  Impression: 1. Upper GI bleed. Hemodynamically stable. History of nonsteroidal anti-inflammatory drug use. 2. Coronary artery  disease, no evidence of myocardial ischemia or infarction on this admission. 3. History of GERD.     Plan:  1. Gastroenterology consultation with a view to EGD. Continue with Protonix intravenously.     LOS: 1 day   Wilson Singer Pager [phone]  11/25/2012, 11:31 AM

## 2012-11-25 NOTE — Consult Note (Signed)
REVIEWED.  Discussed with Dr. Karilyn Cota.

## 2012-11-25 NOTE — Consult Note (Signed)
Referring Provider: Dr Karilyn Cota ([facility]) Primary Care Physician:  MUSE,ROCHELLE D., PA Primary Gastroenterologist:  Dr. Jena Gauss  Reason for Consultation:  ? Upper GI Bleed, Anemia, N/V  HPI: Dustin Cruz is a 47 y.o. male who was in his usual state of health until 3 days ago.  He was watching wrestling Fri night & eating vegetable soup & developed chest pain, diaphoresis, nausea & vomiting.  He says he vomited 5-6 times Fri night.  Denies hematemesis.  He has been seen by cardiology given hx of CAD & MI.  They did not feel pain was cardiac in origin.  His chest pain resolved, however he is tender in epigastrum.  He tells me he felt dizzy & dehydrated & called EMS 2 days ago.  Denies heartburn & indigestion.  Denies dysphagia or odynophagia.  He tells me he is scheduled to have his teeth pulled this week.  He takes prilosec daily.  Not skipping any doses.  He was taking tylenol & daily ASA 325mg , Effient & naprosyn BID.  His hgb was normal upon admission, now dropped 2 grams.  He has had significant IVFs given dehydration.  C/o raw spot on right side of tongue.  No ill contacts.  No new medications.  Denies melena or hematochezia.  Denies diarrhea, constipation or abdominal pain currently.  Weight steadily increasing.  BMs normal.  Ate regular diet today w/ corn flakes, rice krispies, pudding, [place], carrotts, rolls, & jello.  He reports he tolerated it well.  No further nausea or vomiting since Friday night.  Denies fever.  He did have chills.  Last EGD was by Dr Jena Gauss 04/24/12 & he had mild erosive reflux esophagitis, ws dilated with 94F [place], chronic non-h pylori gastritis, & small hiatal hernia.    Past Medical History  Diagnosis Date  . Diabetes mellitus     Type 2  . Hyperlipidemia   . Depression   . Coronary artery disease     s/p BMS to Ramus 10/2010;  Cath 01/22/12 prox 30-40% LAD, LCx ramus w/ hazy 70-80% in-stent restenosis, EF 60% treated medically  . HTN (hypertension)   .  Tobacco abuse   . Obesity   . Anxiety   . Melanoma 2007    surgery at [facility], Followed by [facility]  . Myocardial infarction 2012  . GERD (gastroesophageal reflux disease)   . Hyperlipidemia   . Asthma   . Coronary artery disease     s/p BMS to Ramus 10/2010;  Cath 01/22/12 prox 30-40% LAD, LCx ramus w/ hazy 70-80% in-stent restenosis, EF 60% treated medically   . Major depression, chronic 08/13/2012    Past Surgical History  Procedure Laterality Date  . Melanoma surgery  2007    [facility], removed lymph nodes under arm as well,Left abd  . Cardiac catheterization      with stent  . Esophagogastroduodenoscopy  04/24/2012    [facility]-mild erosive reflux esophagitis,dilated w/94F Elease Hashimoto, small HH, minimal chronic gastric/bulbar erosions(No H pylori)    Prior to Admission medications   Medication Sig Start Date End Date Taking? Authorizing Provider  albuterol (PROVENTIL HFA;VENTOLIN HFA) 108 (90 BASE) MCG/ACT inhaler Inhale 2 puffs into the lungs every 6 (six) hours as needed for wheezing. 08/13/12  Yes Norval Gable, NP  albuterol (PROVENTIL) (2.5 MG/3ML) 0.083% nebulizer solution Take 2.5 mg by nebulization 2 (two) times daily as needed for wheezing or shortness of breath.   Yes Historical Provider, MD  aspirin 325 MG EC tablet Take 1 tablet (325  mg total) by mouth every morning. 08/13/12  Yes Norval Gable, NP  Choline Fenofibrate (TRILIPIX) 135 MG capsule Take 135 mg by mouth daily.   Yes Historical Provider, MD  gabapentin (NEURONTIN) 400 MG capsule Take 1 capsule (400 mg total) by mouth 3 (three) times daily. 08/13/12  Yes Norval Gable, NP  insulin glargine (LANTUS SOLOSTAR) 100 UNIT/ML injection Inject 54 Units into the skin at bedtime. 08/13/12  Yes Norval Gable, NP  lisinopril (PRINIVIL,ZESTRIL) 2.5 MG tablet Take 2.5 mg by mouth daily.   Yes Historical Provider, MD  metFORMIN (GLUCOPHAGE) 1000 MG tablet Take 1 tablet (1,000 mg total) by mouth 2 (two) times daily with a meal.  HOLD for 48 hours, restart on April 18th. 08/13/12  Yes Norval Gable, NP  metoprolol succinate (TOPROL-XL) 25 MG 24 hr tablet Take 1 tablet (25 mg total) by mouth daily. 09/13/12  Yes Jodelle Gross, NP  mometasone-formoterol (DULERA) 100-5 MCG/ACT AERO Inhale 2 puffs into the lungs 2 (two) times daily. 08/13/12  Yes Norval Gable, NP  naproxen (NAPROSYN) 500 MG tablet Take 1 tablet (500 mg total) by mouth 2 (two) times daily with a meal. 11/23/12  Yes Vida Roller, MD  omeprazole (PRILOSEC) 40 MG capsule Take 40 mg by mouth daily.   Yes Historical Provider, MD  prasugrel (EFFIENT) 10 MG TABS Take 1 tablet (10 mg total) by mouth daily. 09/27/12  Yes Jodelle Gross, NP  rosuvastatin (CRESTOR) 20 MG tablet Take 20 mg by mouth daily.   Yes Historical Provider, MD  sertraline (ZOLOFT) 50 MG tablet Take 1 tablet (50 mg total) by mouth daily. 08/13/12  Yes Norval Gable, NP  traZODone (DESYREL) 50 MG tablet Take 50 mg by mouth at bedtime.   Yes Historical Provider, MD  nitroGLYCERIN (NITROSTAT) 0.4 MG SL tablet Place 1 tablet (0.4 mg total) under the tongue every 5 (five) minutes as needed. For chest pain. 08/13/12   Norval Gable, NP  ondansetron (ZOFRAN ODT) 4 MG disintegrating tablet Take 1 tablet (4 mg total) by mouth every 8 (eight) hours as needed for nausea. 11/23/12   Vida Roller, MD  promethazine (PHENERGAN) 25 MG tablet Take 1 tablet (25 mg total) by mouth every 6 (six) hours as needed for nausea. 11/23/12   Vida Roller, MD    Current Facility-Administered Medications  Medication Dose Route Frequency Provider Last Rate Last Dose  . 0.9 %  sodium chloride infusion   Intravenous Continuous Edsel Petrin, DO 125 mL/hr at 11/25/12 9604    . acetaminophen (TYLENOL) tablet 650 mg  650 mg Oral Q6H PRN Edsel Petrin, DO       Or  . acetaminophen (TYLENOL) suppository 650 mg  650 mg Rectal Q6H PRN Edsel Petrin, DO      . albuterol (PROVENTIL HFA;VENTOLIN HFA)  108 (90 BASE) MCG/ACT inhaler 2 puff  2 puff Inhalation Q6H PRN Edsel Petrin, DO      . albuterol (PROVENTIL) (5 MG/ML) 0.5% nebulizer solution 2.5 mg  2.5 mg Nebulization Q2H PRN Edsel Petrin, DO      . aspirin EC tablet 325 mg  325 mg Oral q morning - 10a Edsel Petrin, DO   325 mg at 11/25/12 1000  . atorvastatin (LIPITOR) tablet 40 mg  40 mg Oral q1800 Edsel Petrin, DO      . enoxaparin (LOVENOX) injection 40 mg  40 mg Subcutaneous Q24H Edsel Petrin, DO   40 mg at 11/25/12  1000  . fenofibrate tablet 160 mg  160 mg Oral Daily Edsel Petrin, DO   160 mg at 11/25/12 1000  . gabapentin (NEURONTIN) capsule 400 mg  400 mg Oral TID Edsel Petrin, DO   400 mg at 11/25/12 1000  . gi cocktail (Maalox,Lidocaine,Donnatal)  30 mL Oral BID PRN Edsel Petrin, DO      . HYDROcodone-acetaminophen (NORCO/VICODIN) 5-325 MG per tablet 1-2 tablet  1-2 tablet Oral Q4H PRN Edsel Petrin, DO      . insulin aspart (novoLOG) injection 0-15 Units  0-15 Units Subcutaneous TID WC Edsel Petrin, DO   3 Units at 11/25/12 1200  . insulin glargine (LANTUS) injection 25 Units  25 Units Subcutaneous QHS Edsel Petrin, DO   25 Units at 11/25/12 0111  . metoCLOPramide (REGLAN) injection 10 mg  10 mg Intravenous Q6H PRN Edsel Petrin, DO      . metoprolol succinate (TOPROL-XL) 24 hr tablet 25 mg  25 mg Oral Daily Edsel Petrin, DO   25 mg at 11/25/12 1000  . mometasone-formoterol (DULERA) 100-5 MCG/ACT inhaler 2 puff  2 puff Inhalation BID Edsel Petrin, DO   2 puff at 11/25/12 1114  . pantoprazole (PROTONIX) injection 40 mg  40 mg Intravenous Q12H Edsel Petrin, DO   40 mg at 11/25/12 1000  . prasugrel (EFFIENT) tablet 10 mg  10 mg Oral Daily Edsel Petrin, DO   10 mg at 11/25/12 1000  . senna-docusate (Senokot-S) tablet 1 tablet  1 tablet Oral QHS PRN Edsel Petrin, DO      . sertraline (ZOLOFT) tablet 50 mg  50 mg Oral Daily  Edsel Petrin, DO   50 mg at 11/25/12 1000  . sodium chloride 0.9 % injection 3 mL  3 mL Intravenous Q12H Edsel Petrin, DO   3 mL at 11/25/12 1000  . traZODone (DESYREL) tablet 50 mg  50 mg Oral QHS Edsel Petrin, DO   50 mg at 11/24/12 2315    Allergies as of 11/24/2012  . (No Known Allergies)    Family History:There is no known family history of colorectal carcinoma , liver disease, or inflammatory bowel disease.   Problem Relation Age of Onset  . Lung cancer    . Heart disease    . Other      not real familiar with family history    History   Social History  . Marital Status: Married    Spouse Name: rayann Niu    Number of Children: 0  . Years of Education: N/A   Occupational History  . disabled    Social History Main Topics  . Smoking status: Current Every Day Smoker -- 0.50 packs/day for 30 years    Types: Cigarettes  . Smokeless tobacco: Never Used  . 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 Drug Use: No  . Sexually Active: Yes   Other Topics Concern  . Not on file   Social History Narrative   Lives w/ wife    Review of Systems: Gen: see HPI CV: Deniespalpitations, orthopnea, PND, peripheral edema, and claudication. Resp: +chronic nonproductive cough.  Denies dyspnea at rest, dyspnea with exercise,wheezing, coughing up blood, and pleurisy. GI: Denies vomiting blood, jaundice, and fecal incontinence.    GU : Denies urinary burning, blood in urine, urinary frequency, urinary hesitancy, nocturnal urination, and urinary incontinence. MS: Denies joint pain, limitation of movement,  and swelling, stiffness, low back pain, extremity pain. Denies muscle weakness, cramps, atrophy.  Derm: Denies rash, itching, dry skin, hives, moles, warts, or unhealing ulcers.  Psych: Denies depression, anxiety, memory loss, suicidal ideation, hallucinations, paranoia, and confusion. Heme: Denies bruising, bleeding, and enlarged  lymph nodes. Neuro:  Denies any headaches or  paresthesias. Endo:  Diabetes has been poorly controlled  Physical Exam: Vital signs in last 24 hours: Temp:  [97.3 F (36.3 C)-98.3 F (36.8 C)] 97.3 F (36.3 C) (02/17 1545) Pulse Rate:  [74-90] 76 (02/17 1545) Resp:  [11-22] 18 (02/17 1545) BP: (115-149)/(60-84) 149/84 mmHg (02/17 1545) SpO2:  [84 %-99 %] 99 % (02/17 1545) Weight:  [230 lb (104.327 kg)-233 lb 3.2 oz (105.779 kg)] 233 lb 3.2 oz (105.779 kg) (02/16 2236) Last BM Date: 11/25/12 No LMP for male patient. General:   Alert,  Well-developed, obese, pleasant and cooperative in NAD  Head:  Normocephalic and atraumatic. Eyes:  Sclera clear, no icterus.   Conjunctiva pink. Ears:  Normal auditory acuity. Nose:  No deformity, discharge, or lesions. Mouth:  No deformity or lesions,oropharynx pink & moist. Neck:  Supple; no masses or thyromegaly. Lungs:  Clear throughout to auscultation.   No wheezes, crackles, or rhonchi. No acute distress. Heart:  Regular rate and rhythm; no murmurs, clicks, rubs,  or gallops. Abdomen:  Normal bowel sounds.  No bruits.  Soft, non-distended.  +Murphy's pt tenderness.  No masses, hepatosplenomegaly or hernias noted.  No guarding or rebound tenderness.  Exam limited given body habitus. Rectal:  No external lesions visualized.  No internal masses palpated.  Small amt hemoccult negative stool was obtained from the vault. Msk:  Symmetrical without gross deformities. Normal posture. Pulses:  Normal pulses noted. Extremities:  No edema. Neurologic:  Alert and oriented x4;  grossly normal neurologically. Skin:  Intact without significant lesions or rashes. Lymph Nodes:  No significant cervical adenopathy. Psych:  Alert and cooperative. Normal mood and affect.  Intake/Output from previous day: 02/16 0701 - 02/17 0700 In: 2025 [I.V.:2025] Out: -  Intake/Output this shift:    Lab Results:  Recent Labs  11/23/12 1911 11/24/12 1738 11/25/12 0413   WBC 7.4 8.0 6.1  HGB 14.5 13.2 12.7*  HCT 42.6 39.1 37.9*  PLT 379 338 319   BMET  Recent Labs  11/23/12 1911 11/24/12 1738 11/25/12 0413  NA 136 139 141  K 4.0 4.0 4.1  CL 98 104 106  CO2 27 26 26   GLUCOSE 198* 138* 98  BUN 15 17 17   CREATININE 1.13 1.13 1.07  CALCIUM 9.5 8.5 8.6   LFT  Recent Labs  11/24/12 1738 11/24/12 2145  PROT 6.3  --   ALBUMIN 3.7  --   AST 10  --   ALT 12  --   ALKPHOS 32*  --   BILITOT 0.2*  --   LIPASE  --  15   Studies/Results: X-ray Chest Pa And Lateral   11/24/2012  *RADIOLOGY REPORT*  Clinical Data: Nausea, vomiting, chest pain.  CHEST - 2 VIEW  Comparison: 08/03/2012  Findings: The cardiomediastinal contours are within normal range. Lungs are predominately clear.  No pleural effusion or pneumothorax.  No acute osseous finding.  IMPRESSION: No radiographic evidence of acute cardiopulmonary process.   Original Report Authenticated By: Jearld Lesch, M.D.     Impression: CORDE ANTONINI is a pleasant 47 y.o. male admitted with acute chest pain, nausea, & vomiting.  His hgb has dropped almost 2 grams since admission.  He has received IVFs, & I suspect this may be dilutional.  He is hemoccult negative & has no reports of gross bleeding.  He is at significant risk for GI bleed, however given Effient, Aspirin, & NSAID.  He is tender at Murphy's point & chest pain & nausea was brought on by eating, so gallbladder etiology remains in the differential.  Others include gastritis/esophagitis, PUD, or acute gastroenteritis.  Vomiting has resolved.  Last EGD 04/2012 showed mild erosive reflux esophagitis/gastritis & he has been on daily PPI since that time.  Plan: 1. Follow CBC, LFTs in AM 2. BID PPI 3. Avoid NSAIDs 4. ABD [place] tomorrow AM 5. Consider EGD if evidence of gross GI bleeding or hgb does not equilibrate.  We would like to thank you for the opportunity to participate in the care of Dustin Cruz.   LOS: 1 day   Lorenza Burton   11/25/2012, 4:41 PM [facility]  5:14 PM  Discussed w/ Dr Darrick Penna.  She recommends colonoscopy +/- EGD end of the week.  Will discuss anticoagulation management with cardiology as pt is on Effient, Lovenox & Aspirin.  Lorenza Burton  5:43 PM Discussed w/on-call [facility] NP Brion Aliment.  OK to stop Lovenox.  Continue Aspirin.  Effient will need to be addressed w/ Cardiology Attending in the morning regarding holding for procedure.  If we can stop, for how does it need to be held for colonoscopy +/- EGD?  Lorenza Burton

## 2012-11-25 NOTE — Progress Notes (Signed)
UR Chart Review Completed  

## 2012-11-25 NOTE — Consult Note (Signed)
CARDIOLOGY CONSULT NOTE  Patient ID: SEMISI BIELA MRN: [id number] DOB/AGE: 1966/01/28 47 y.o.  Admit date: 11/24/2012 Referring Physician:PTH Primary PhysicianMUSE,ROCHELLE D., PA Primary Cardiologist: Gladstone Bing MD Reason for Consultation: Chest Pain , abnormal EKG with new inferior T-wave inversion Principal Problem:   Chest pain Active Problems:   CAD (coronary artery disease), native coronary artery   Hypercholesterolemia   Obesity (BMI 30-39.9)   LBBB (left bundle branch block)   Insulin dependent type 2 diabetes mellitus, controlled   Tobacco abuse   GERD (gastroesophageal reflux disease)   Hyperlipidemia   Depression   HTN (hypertension)   Anxiety   Abnormal EKG   Peripheral neuropathy   Nausea with vomiting   Melanoma of skin, site unspecified  HPI: Mr. Correia is a 47 y/o patient admitted with the epigastric discomfort  after eating soup and smoking a cigarette, with associated severe diaphoresis, nausea and vomiting and dehydration, with history of CAD with BMS to the ostial ramus, with  instent restenosis per cath in April of 2013, treated medically due to risks of compromising LAD and large diagonal. He was seen in the ER on two occasions since initial onset of nausea, vomiting and diarrhea, 2 days prior to admission. He was, given IV fluids for dehydration but came back the following day and was admitted due to ongoing symptoms, but with now associated sharp stinging chest discomfort which was a new symptom for him, to rule out ACS. He states that he took his BP at home and it was found to be in the 170's systolic, He called EMS, who advised him to take NTG and ASA. Symptoms decreased but were not resolved. Pain not similar to pain he experienced prior to stent placement, with no radiation or dyspnea. EKG on 1/16 demonstrated T-wave inversion in the inferior leads, but subsequent EKG this am demonstrated resolution of inferior T-wave abnormalities. He has no further  complaints of nausea or chest discomfort. Pain as moved to left lower quadrant of abdomen.  Review of systems complete and found to be negative unless listed above   Past Medical History  Diagnosis Date  . Diabetes mellitus     Type 2  . Hyperlipidemia   . Depression   . Coronary artery disease     s/p BMS to Ramus 10/2010;  Cath 01/22/12 prox 30-40% LAD, LCx ramus w/ hazy 70-80% in-stent restenosis, EF 60% treated medically  . HTN (hypertension)   . Tobacco abuse   . Obesity   . Anxiety   . Melanoma 2007    surgery at [facility]  . Myocardial infarction 2012  . GERD (gastroesophageal reflux disease)   . Hyperlipidemia   . No pertinent past medical history   . Asthma   . Coronary artery disease     s/p BMS to Ramus 10/2010;  Cath 01/22/12 prox 30-40% LAD, LCx ramus w/ hazy 70-80% in-stent restenosis, EF 60% treated medically   . Major depression, chronic 08/13/2012    Family History  Problem Relation Age of Onset  . Lung cancer    . Heart disease    . Other      not real familiar with family history    History   Social History  . Marital Status: Married    Spouse Name: rayann Kenan    Number of Children: 0  . Years of Education: N/A   Occupational History  . disabled    Social History Main Topics  . Smoking status: Current Every Day Smoker --  0.50 packs/day for 30 years    Types: Cigarettes  . Smokeless tobacco: Never Used  . 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 Drug Use: No  . Sexually Active: Yes   Other Topics Concern  . Not on file   Social History Narrative  . No narrative on file    Past Surgical History  Procedure Laterality Date  . Melanoma surgery  2007    [facility], removed lymph nodes under arm as well  . Cardiac catheterization      with stent  . Esophagogastroduodenoscopy  04/24/2012    Procedure: ESOPHAGOGASTRODUODENOSCOPY (EGD);  Surgeon: Corbin Ade, MD;  Location: [facility];  Service: Endoscopy;  Laterality:  N/A;     Prescriptions prior to admission  Medication Sig Dispense Refill  . albuterol (PROVENTIL HFA;VENTOLIN HFA) 108 (90 BASE) MCG/ACT inhaler Inhale 2 puffs into the lungs every 6 (six) hours as needed for wheezing.  1 Inhaler  2  . albuterol (PROVENTIL) (2.5 MG/3ML) 0.083% nebulizer solution Take 2.5 mg by nebulization 2 (two) times daily as needed for wheezing or shortness of breath.      [name] aspirin 325 MG EC tablet Take 1 tablet (325 mg total) by mouth every morning.  30 tablet  0  . Choline Fenofibrate (TRILIPIX) 135 MG capsule Take 135 mg by mouth daily.      [name] gabapentin (NEURONTIN) 400 MG capsule Take 1 capsule (400 mg total) by mouth 3 (three) times daily.  90 capsule  3  . insulin glargine (LANTUS SOLOSTAR) 100 UNIT/ML injection Inject 54 Units into the skin at bedtime.  10 mL  0  . lisinopril (PRINIVIL,ZESTRIL) 2.5 MG tablet Take 2.5 mg by mouth daily.      . metFORMIN (GLUCOPHAGE) 1000 MG tablet Take 1 tablet (1,000 mg total) by mouth 2 (two) times daily with a meal. HOLD for 48 hours, restart on April 18th.      . metoprolol succinate (TOPROL-XL) 25 MG 24 hr tablet Take 1 tablet (25 mg total) by mouth daily.  30 tablet  6  . mometasone-formoterol (DULERA) 100-5 MCG/ACT AERO Inhale 2 puffs into the lungs 2 (two) times daily.  1 Inhaler  0  . naproxen (NAPROSYN) 500 MG tablet Take 1 tablet (500 mg total) by mouth 2 (two) times daily with a meal.  30 tablet  0  . omeprazole (PRILOSEC) 40 MG capsule Take 40 mg by mouth daily.      . prasugrel (EFFIENT) 10 MG TABS Take 1 tablet (10 mg total) by mouth daily.  30 tablet  6  . rosuvastatin (CRESTOR) 20 MG tablet Take 20 mg by mouth daily.      . sertraline (ZOLOFT) 50 MG tablet Take 1 tablet (50 mg total) by mouth daily.  30 tablet  0  . traZODone (DESYREL) 50 MG tablet Take 50 mg by mouth at bedtime.      . nitroGLYCERIN (NITROSTAT) 0.4 MG SL tablet Place 1 tablet (0.4 mg total) under the tongue every 5 (five) minutes as needed. For  chest pain.  25 tablet  3  . ondansetron (ZOFRAN ODT) 4 MG disintegrating tablet Take 1 tablet (4 mg total) by mouth every 8 (eight) hours as needed for nausea.  10 tablet  0  . promethazine (PHENERGAN) 25 MG tablet Take 1 tablet (25 mg total) by mouth every 6 (six) hours as needed for nausea.  12 tablet  0   Cardiac Cath  4.15.13 ([facility]) Findings:  LV EDP: 25  Left main: Normal; LAD: Prox 30-40% LCX: Ramus ostium -> BMS with 70-80% hazy in-stent restenosis. RCA: Dominant, 30% lesions in midsection.  EF-60%  Plan: Given small size of ramus, risk benefit ratio of re-intervention not favorable; risk of compromising the LAD & D1->treat medically.  Physical Exam: Blood pressure 115/61, pulse 74, temperature 97.6 F (36.4 C), temperature source Oral, resp. rate 16, height 6\' 2"  (1.88 m), weight 233 lb 3.2 oz (105.779 kg), SpO2 95.00%. General: Well developed, well nourished, in no acute distress Head: Eyes PERRLA, Positive for xanthomas.   Normal cephalic and atramatic  Lungs: Clear bilaterally to auscultation with exception of inspiratory wheezes, cleared with cough after deep inspiration on assessment. Heart: HRRR S1 S2, without MRG.  Pulses are 2+ & equal.            No carotid bruit. No JVD.  No abdominal bruits. No femoral bruits. Abdomen: Bowel sounds are positive, abdomen soft and mild tenderness to palpation without masses or                  Hernia's noted. Msk:  Back normal, normal gait. Normal strength and tone for age. Extremities: No clubbing, cyanosis or edema.  DP +1 Neuro: Alert and oriented X 3. Psych:  Good affect, responds appropriately   Lab Results  Component Value Date   WBC 6.1 11/25/2012   HGB 12.7* 11/25/2012   HCT 37.9* 11/25/2012   MCV 93.6 11/25/2012   PLT 319 11/25/2012    Recent Labs Lab 11/24/12 1738 11/25/12 0413  NA 139 141  K 4.0 4.1  CL 104 106  CO2 26 26  BUN 17 17  CREATININE 1.13 1.07  CALCIUM 8.5 8.6  PROT 6.3  --   BILITOT 0.2*  --    ALKPHOS 32*  --   ALT 12  --   AST 10  --   GLUCOSE 138* 98   Lab Results  Component Value Date   CKTOTAL 80 03/07/2012   CKMB 3.4 03/07/2012   TROPONINI <0.30 11/25/2012    Lab Results  Component Value Date   CHOL 112 11/25/2012   HDL 45 11/25/2012   LDLCALC 45 11/25/2012   TRIG 109 11/25/2012   CHOLHDL 2.5 11/25/2012     Radiology: X-ray Chest Pa And Lateral   11/24/2012  *RADIOLOGY REPORT*  Clinical Data: Nausea, vomiting, chest pain.  CHEST - 2 VIEW  Comparison: 08/03/2012  Findings: The cardiomediastinal contours are within normal range. Lungs are predominately clear.  No pleural effusion or pneumothorax.  No acute osseous finding.  IMPRESSION: No radiographic evidence of acute cardiopulmonary process.   Original Report Authenticated By: Jearld Lesch, M.D.    EKG:NSR, LAE, LBBB, with rate of 73 bpm.  ASSESSMENT AND PLAN:   1. ASCVD:  Chest discomfort is more in the epigastric area, described as stinging and sharp occuring 2 days after nausea and vomiting which was ongoing. Troponin is negative for ACS. Doubt cardiac etiology of current discomfort. Will continue medical management of CAD with ASA, metoprolol, prasugrel and statin. If symptoms resolved, can go home from cardiac standpoint with follow up in our office, appointment is previously scheduled.  2. Acute Nausea and Vomiting: Probable acute gastritis. He denies hemoptysis or melena on prasugrel and ASA. Symptoms have resolved.Cannot rule out diabetic gastroparesis. Will defer to PTH for further work-up at their discretion.Lipase WNL  3. Hypertension: Currently well controlled on medications.  Creatinine 1.07.  4.  Ongoing tobacco abuse: Discussed cessation. He states that he is trying to cut down.  5. Hypercholesterolemia; Continue on atorvastatin 40 mg daily. TC-112, TG-109, HDL-45, LDL-45  6. Diabetes  7. Anxiety and Depression  Bettey Mare. Lyman Bishop NP [facility] 11/25/2012, 10:28 AM  Cardiology  Attending Patient interviewed and examined. Discussed with Joni Reining, NP.  Above note annotated and modified based upon my findings.  Cardiovascular risk factors are under generally good control with very low total and LDL cholesterol but slightly suboptimal systolic blood pressures.  Dose of metoprolol will be increased.  Doubt acute coronary syndrome. No further inpatient assessment or treatment required from a cardiology standpoint.  Patient reports polyuria; intravenous fluids will be decreased.  [place] Bing, MD 11/25/2012, 7:41 PM

## 2012-11-25 NOTE — Care Management Note (Signed)
    Page 1 of 1   11/26/2012     2:24:40 PM   CARE MANAGEMENT NOTE 11/26/2012  Patient:  Dustin Cruz, Dustin Cruz   Account Number:  [id number]  Date Initiated:  11/25/2012  Documentation initiated by:  Rosemary Holms  Subjective/Objective Assessment:   Pt admitted from home where he lives with his spouse. 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 Declines additional HH.     Action/Plan:   Anticipated DC Date:  11/26/2012   Anticipated DC Plan:  HOME/SELF CARE      DC Planning Services  CM consult      Choice offered to / List presented to:             Status of service:  Completed, signed off Medicare Important Message given?   (If response is "NO", the following Medicare IM given date fields will be blank) Date Medicare IM given:   Date Additional Medicare IM given:    Discharge Disposition:    Per UR Regulation:    If discussed at Long Length of Stay Meetings, dates discussed:    Comments:  11/25/12 Rosemary Holms RN BSN CM

## 2012-11-26 ENCOUNTER — Telehealth: Payer: Self-pay | Admitting: Gastroenterology

## 2012-11-26 ENCOUNTER — Observation Stay (HOSPITAL_COMMUNITY): Payer: Medicaid Other

## 2012-11-26 DIAGNOSIS — R112 Nausea with vomiting, unspecified: Secondary | ICD-10-CM

## 2012-11-26 DIAGNOSIS — I2510 Atherosclerotic heart disease of native coronary artery without angina pectoris: Secondary | ICD-10-CM

## 2012-11-26 DIAGNOSIS — R197 Diarrhea, unspecified: Secondary | ICD-10-CM

## 2012-11-26 DIAGNOSIS — R079 Chest pain, unspecified: Secondary | ICD-10-CM

## 2012-11-26 LAB — GLUCOSE, CAPILLARY

## 2012-11-26 LAB — COMPREHENSIVE METABOLIC PANEL
ALT: 15 U/L (ref 0–53)
Alkaline Phosphatase: 29 U/L — ABNORMAL LOW (ref 39–117)
CO2: 27 mEq/L (ref 19–32)
Calcium: 9.1 mg/dL (ref 8.4–10.5)
GFR calc Af Amer: >90 mL/min (ref >90)
GFR calc non Af Amer: 80 mL/min — ABNORMAL LOW (ref >90)
Glucose, Bld: 95 mg/dL (ref 70–99)
Sodium: 141 mEq/L (ref 135–145)
Total Bilirubin: 0.3 mg/dL (ref 0.3–1.2)

## 2012-11-26 LAB — CBC
Hemoglobin: 12.7 g/dL — ABNORMAL LOW (ref 13.0–17.0)
MCH: 31.1 pg (ref 26.0–34.0)
RBC: 4.09 MIL/uL — ABNORMAL LOW (ref 4.22–5.81)

## 2012-11-26 MED ORDER — ASPIRIN 81 MG PO TBEC: 81.0000 mg | DELAYED_RELEASE_TABLET | Freq: Every day | ORAL | Status: DC

## 2012-11-26 MED ORDER — FENOFIBRATE 160 MG PO TABS: 160.0000 mg | ORAL_TABLET | Freq: Every day | ORAL | Status: DC

## 2012-11-26 MED ORDER — OMEPRAZOLE 40 MG PO CPDR: 40.0000 mg | DELAYED_RELEASE_CAPSULE | Freq: Two times a day (BID) | ORAL | Status: DC

## 2012-11-26 MED ORDER — INSULIN GLARGINE 100 UNIT/ML ~~LOC~~ SOLN: 25.0000 [IU] | Freq: Every day | SUBCUTANEOUS | Status: DC

## 2012-11-26 MED ORDER — HYDROCODONE-ACETAMINOPHEN 5-325 MG PO TABS: 1.0000 | ORAL_TABLET | ORAL | Status: DC | PRN

## 2012-11-26 NOTE — Progress Notes (Signed)
*  PRELIMINARY RESULTS* Echocardiogram 2D Echocardiogram has been performed.  Conrad Walters 11/26/2012, 10:03 AM

## 2012-11-26 NOTE — Progress Notes (Signed)
Discharge instructions given to pt. And pt's wife with teach back given to RN. Pt. Taken to car via W/C.

## 2012-11-26 NOTE — Telephone Encounter (Signed)
Please have patient follow-up with me in next few weeks for hospital follow-up.  Discharging today. Thanks!

## 2012-11-26 NOTE — Discharge Summary (Signed)
Physician Discharge Summary  Dustin Cruz AVW:[id number] DOB: 1966-01-05 DOA: 11/24/2012  PCP: Kizzie Furnish D., PA  Admit date: 11/24/2012 Discharge date: 11/26/2012  Time spent: 40 minutes  Recommendations for Outpatient Follow-up:  1. [facility] gastroenterology will call for OP follow up appointment 2. Avoid NSAIDS  Discharge Diagnoses:  Principal Problem:    Discharge Condition: medically stable and ready for discharge home Diet recommendation: carb modified  Filed Weights   11/24/12 1649 11/24/12 2236  Weight: 104.327 kg (230 lb) 105.779 kg (233 lb 3.2 oz)    History of present illness:  Dustin Cruz is a 47 y.o. male with ASCVD status post PCI with BMS stenting to ostial ramus for STEMI in 2012, with subsequent in-stent restenosis in 01/2012, ongoing tobacco abuse, DM2,COPD and HTN who was evaluated in the ED on 11/24/12 for nausea/vomiting and dehydration who returned 11/25/12 with continued symptoms including dizziness, nausea and chest pain. He reported that the symptoms started started 2 days prior (Friday evening) after eating dinner and going outside in the cold with some of his friends to smoke a cigarette- he developed acute onset nausea, chest tightness, intense (shirt-soaking) diaphoresis and vomited. He reported having nausea and vomiting throughout Friday evening about 4-5 times with dull chest pain/epigastric pain. He then came to the ED for evaluation 11/24/12 and was found to be dehydrated, was given IV fluids and anti-nausea meds and discharged. He returned 11/25/12 due to persistent nausea and chest pain and new dizziness, the CP persisted in his epigastric area. The pain did not radiate and was rated 2/10, if he leaned forward it became more sharp and he described the pain as a "different pain". He complained of dyspnea with any exertion. He had been taking NSAIDS regularly for osteoarthritis and peripheral neuropathy leg pain despite having a diagnosis of severe  gastritis/esophagitis diagnosed by EGD 04/2012 by Dr. Jena Gauss.  In the ED he continued to have low level discomfort in his epigastric/chest region, nausea and labwork suggested dehydration. Cardiac enzymes negative.  Admission requested for further evaluation and monitoring of his symptoms   Hospital Course:  Chest pain: pt admitted to tele. CE negative. No events on tele. Cardiology consulted no indication myocardial ischemia or infarction. Pt had no further episodes of CP during this hospitalization. Continued on home medications Active Problems:   CAD (coronary artery disease), native coronary : see #1  Upper GI bleed: pt reporting recent dark emesis. Hx esophagitis and NSAID use. Gastroenterology consulted. Recommended cessation NSAID, increasing PPI to BID. Will follow up with pt OP basis to determine need for further work up. At discharge pt tolerating carb modified diet without abdominal pain or nausea.       Insulin dependent type 2 diabetes mellitus, controlled: CBG range 90-112 in hospital on half of home lantus dose. Will discharge on 25units lantus and discontinued Actos. Recommend follow up with PCP for trending.     Tobacco abuse: counseled for cessation    GERD (gastroesophageal reflux disease):     HTN (hypertension): controlled. Continue home meds      Procedures:  none)  Consultations:  Gastroenterology  Cardiology  Discharge Exam: Filed Vitals:   11/25/12 1545 11/25/12 2201 11/26/12 0543 11/26/12 0734  BP: 149/84 129/79 140/83   Pulse: 76 71 72   Temp: 97.3 F (36.3 C) 97.9 F (36.6 C) 97.7 F (36.5 C)   TempSrc: Oral Oral Oral   Resp: 18 19 18    Height:      Weight:  SpO2: 99% 98% 97% 98%    General: awake alert sitting up in bed eating breakfaste. NAD Cardiovascular: RRR No MGR No LEE Respiratory: normal effort BSCTAB no wheeze/rhonchi Abdomen obese, soft +BS non-tender to palpation.   Discharge Instructions  Discharge Orders   Future  Orders Complete By Expires     Call MD for:  persistant dizziness or light-headedness  As directed     Call MD for:  persistant nausea and vomiting  As directed     Diet - low sodium heart healthy  As directed     Increase activity slowly  As directed         Medication List    STOP taking these medications       naproxen 500 MG tablet  Commonly known as:  NAPROSYN     pioglitazone 15 MG tablet  Commonly known as:  ACTOS     TRILIPIX 135 MG capsule  Generic drug:  Choline Fenofibrate  Replaced by:  fenofibrate 160 MG tablet      TAKE these medications       albuterol 108 (90 BASE) MCG/ACT inhaler  Commonly known as:  PROVENTIL HFA;VENTOLIN HFA  Inhale 2 puffs into the lungs every 6 (six) hours as needed for wheezing.     albuterol (2.5 MG/3ML) 0.083% nebulizer solution  Commonly known as:  PROVENTIL  Take 2.5 mg by nebulization 2 (two) times daily as needed for wheezing or shortness of breath.     aspirin 81 MG EC tablet  Take 1 tablet (81 mg total) by mouth daily.     fenofibrate 160 MG tablet  Take 1 tablet (160 mg total) by mouth daily.     gabapentin 400 MG capsule  Commonly known as:  NEURONTIN  Take 1 capsule (400 mg total) by mouth 3 (three) times daily.     HYDROcodone-acetaminophen 5-325 MG per tablet  Commonly known as:  NORCO/VICODIN  Take 1-2 tablets by mouth every 4 (four) hours as needed.     insulin glargine 100 UNIT/ML injection  Commonly known as:  LANTUS SOLOSTAR  Inject 54 Units into the skin at bedtime.     lisinopril 2.5 MG tablet  Commonly known as:  PRINIVIL,ZESTRIL  Take 2.5 mg by mouth daily.     metFORMIN 1000 MG tablet  Commonly known as:  GLUCOPHAGE  Take 1 tablet (1,000 mg total) by mouth 2 (two) times daily with a meal. HOLD for 48 hours, restart on April 18th.     metoprolol succinate 25 MG 24 hr tablet  Commonly known as:  TOPROL-XL  Take 1 tablet (25 mg total) by mouth daily.     mometasone-formoterol 100-5 MCG/ACT Aero   Commonly known as:  DULERA  Inhale 2 puffs into the lungs 2 (two) times daily.     nitroGLYCERIN 0.4 MG SL tablet  Commonly known as:  NITROSTAT  Place 1 tablet (0.4 mg total) under the tongue every 5 (five) minutes as needed. For chest pain.     omeprazole 40 MG capsule  Commonly known as:  PRILOSEC  Take 1 capsule (40 mg total) by mouth 2 (two) times daily.     ondansetron 4 MG disintegrating tablet  Commonly known as:  ZOFRAN ODT  Take 1 tablet (4 mg total) by mouth every 8 (eight) hours as needed for nausea.     prasugrel 10 MG Tabs  Commonly known as:  EFFIENT  Take 1 tablet (10 mg total) by mouth daily.  promethazine 25 MG tablet  Commonly known as:  PHENERGAN  Take 1 tablet (25 mg total) by mouth every 6 (six) hours as needed for nausea.     rosuvastatin 20 MG tablet  Commonly known as:  CRESTOR  Take 20 mg by mouth daily.     sertraline 50 MG tablet  Commonly known as:  ZOLOFT  Take 1 tablet (50 mg total) by mouth daily.     traZODone 50 MG tablet  Commonly known as:  DESYREL  Take 50 mg by mouth at bedtime.           Follow-up Information   Follow up with [name], MD. [facility] will call for appoitment)    Contact information:   [address] [place] [address] [phone]        The results of significant diagnostics from this hospitalization (including imaging, microbiology, ancillary and laboratory) are listed below for reference.    Significant Diagnostic Studies: X-ray Chest Pa And Lateral   11/24/2012  *RADIOLOGY REPORT*  Clinical Data: Nausea, vomiting, chest pain.  CHEST - 2 VIEW  Comparison: 08/03/2012  Findings: The cardiomediastinal contours are within normal range. Lungs are predominately clear.  No pleural effusion or pneumothorax.  No acute osseous finding.  IMPRESSION: No radiographic evidence of acute cardiopulmonary process.   Original Report Authenticated By: Jearld Lesch, M.D.         Labs: Basic Metabolic Panel:  Recent Labs Lab 11/23/12 1911 11/24/12 1738 11/25/12 0413 11/26/12 0556  NA 136 139 141 141  K 4.0 4.0 4.1 4.1  CL 98 104 106 106  CO2 27 26 26 27   GLUCOSE 198* 138* 98 95  BUN 15 17 17 10   CREATININE 1.13 1.13 1.07 1.08  CALCIUM 9.5 8.5 8.6 9.1   Liver Function Tests:  Recent Labs Lab 11/24/12 1738 11/26/12 0556  AST 10 13  ALT 12 15  ALKPHOS 32* 29*  BILITOT 0.2* 0.3  PROT 6.3 6.2  ALBUMIN 3.7 3.5    Recent Labs Lab 11/24/12 2145  LIPASE 15    CBC:  Recent Labs Lab 11/23/12 1911 11/24/12 1738 11/25/12 0413 11/26/12 0556  WBC 7.4 8.0 6.1 6.5  NEUTROABS 3.3 4.5  --   --   HGB 14.5 13.2 12.7* 12.7*  HCT 42.6 39.1 37.9* 38.1*  MCV 92.8 93.1 93.6 93.2  PLT 379 338 319 318   Cardiac Enzymes:  Recent Labs Lab 11/23/12 1911 11/24/12 1738 11/24/12 2145 11/25/12 0413 11/25/12 0852  TROPONINI <0.30 <0.30 <0.30 <0.30 <0.30   BNP: BNP (last 3 results)  Recent Labs  07/02/12 2028 08/03/12 2140 11/24/12 2145  PROBNP 146.7* 400.3* 227.9*   CBG:  Recent Labs Lab 11/25/12 0758 11/25/12 1149 11/25/12 1654 11/25/12 2056 11/26/12 0743  GLUCAP 82 183* 129* 91 82       Signed:  BLACK,KAREN M  [facility] 11/26/2012, 10:42 AM Attending: Patient seen and above-noted reviewed. Agree with above. Patient is stable for discharge

## 2012-11-26 NOTE — Progress Notes (Signed)
Subjective: Notes onset of chest pain, N/V Friday night, worsening till Sunday, called EMS. Has been on Naprosyn BID, Effient. No melena, no hematochezia. Had loose stools overnight last night. Feels raw. States diarrhea "all night long". Notes lower abdominal discomfort. Notes epigastric discomfort. Intermittent. No further N/V. Ate breakfast. Stomach virus "going around" where he lives (apartment complex). Recently on abx for teeth extraction.   Discussed with hospitalist; pt did not tell me he had coffee-ground emesis.    Objective: Vital signs in last 24 hours: Temp:  [97.3 F (36.3 C)-97.9 F (36.6 C)] 97.7 F (36.5 C) (02/18 0543) Pulse Rate:  [71-76] 72 (02/18 0543) Resp:  [18-19] 18 (02/18 0543) BP: (129-149)/(79-84) 140/83 mmHg (02/18 0543) SpO2:  [84 %-99 %] 98 % (02/18 0734) Last BM Date: 11/26/12 General:   Alert and oriented, pleasant Head:  Normocephalic and atraumatic. Eyes:  No icterus, sclera clear. Conjuctiva pink.  Mouth:  Without lesions, mucosa pink and moist.  Heart:  S1, S2 present, no murmurs noted.  Lungs: Clear to auscultation bilaterally, without wheezing, rales, or rhonchi.  Abdomen:  Bowel sounds present, soft, mild TTP epigastric region, lower abdomen, mildly distended but soft, NOT firm. .  Msk:  Symmetrical without gross deformities. Normal posture. Neurologic:  Alert and  oriented x4;  grossly normal neurologically. Skin:  Warm and dry, intact without significant lesions.  Psych:  Alert and cooperative. Normal mood and affect.  Intake/Output from previous day: 02/17 0701 - 02/18 0700 In: 2406.1 [P.O.:240; I.V.:2166.1] Out: -  Intake/Output this shift:    Lab Results:  Recent Labs  11/24/12 1738 11/25/12 0413 11/26/12 0556  WBC 8.0 6.1 6.5  HGB 13.2 12.7* 12.7*  HCT 39.1 37.9* 38.1*  PLT 338 319 318   BMET  Recent Labs  11/24/12 1738 11/25/12 0413 11/26/12 0556  NA 139 141 141  K 4.0 4.1 4.1  CL 104 106 106  CO2 26 26 27    GLUCOSE 138* 98 95  BUN 17 17 10   CREATININE 1.13 1.07 1.08  CALCIUM 8.5 8.6 9.1   LFT  Recent Labs  11/24/12 1738 11/26/12 0556  PROT 6.3 6.2  ALBUMIN 3.7 3.5  AST 10 13  ALT 12 15  ALKPHOS 32* 29*  BILITOT 0.2* 0.3    Studies/Results: X-ray Chest Pa And Lateral   11/24/2012  *RADIOLOGY REPORT*  Clinical Data: Nausea, vomiting, chest pain.  CHEST - 2 VIEW  Comparison: 08/03/2012  Findings: The cardiomediastinal contours are within normal range. Lungs are predominately clear.  No pleural effusion or pneumothorax.  No acute osseous finding.  IMPRESSION: No radiographic evidence of acute cardiopulmonary process.   Original Report Authenticated By: Jearld Lesch, M.D.     Assessment: 47 year old male admitted with acute chest pain, nausea, and vomiting. Hgb with mild drift from 14.5 several days ago to 12.7 currently. Likely dilutional. Heme negative. However, pt did not tell me he had experienced coffee-ground emesis in the setting of Effient, Aspirin, and NSAIDs. Last EGD with Dr. Jena Gauss in July 2013 noting mild erosive reflux esophagitis/gastritis; however, he has remained on a PPI since then. If possible coffee ground emesis, could be secondary to known esophagitis and gastritis. Drop in Hgb likely dilutional.  However, patient also now tells me that he has had numerous loose stools overnight, no rectal bleeding. Also notes exposure to antibiotics recently after teeth extraction, and he also reports multiple sick contacts (stomach virus) in his apartment complex. Will order Cdiff PCR, stool culture, and Giardia. Gallbladder  remains in situ.   Plan: [place] of abdomen today: follow-up results. Needs to be NPO (he ate breakfast) BID PPI Obtain Cdiff PCR, stool culture, Giardia Hopeful d/c in the next 24 hours with outpatient colonoscopy+/- EGD if necessary on 2/24   LOS: 2 days   Gerrit Halls  11/26/2012, 7:59 AM    ADDENDUM 2/18 at 1020: Will have patient follow-up in our office in  the 1-2 weeks to discuss need for further procedures. If no evidence of rectal bleeding or overt GI bleeding, can hold off on screening colonoscopy until age 58. Needs to continue BID PPI for now.   Patient seen and examined this morning at 09:45. I agree with above assessment and addendum. Will follow up on abdominal ultrasound as it becomes available.

## 2012-11-26 NOTE — ED Provider Notes (Signed)
Medical screening examination/treatment/procedure(s) were conducted as a shared visit with non-physician practitioner(s) and myself.  I personally evaluated the patient during the encounter  Please see my separate respective documentation pertaining to this patient encounter   Vida Roller, MD 11/26/12 [phone]

## 2012-11-28 ENCOUNTER — Other Ambulatory Visit (HOSPITAL_COMMUNITY): Payer: Self-pay

## 2012-11-28 LAB — OVA AND PARASITE EXAMINATION: Ova and parasites: NONE SEEN

## 2012-11-28 NOTE — Telephone Encounter (Signed)
Pt is aware of OV on 3/20 at 2 with AS and appt card was mailed

## 2012-11-30 LAB — STOOL CULTURE

## 2012-12-12 ENCOUNTER — Telehealth: Payer: Self-pay

## 2012-12-12 NOTE — Telephone Encounter (Signed)
Called and informed pt's wife. She said if we have a cancellation, to give them a call and they will try their best to make it.

## 2012-12-12 NOTE — Telephone Encounter (Signed)
Fatty liver on [place]. Make sure he is taking his PPI.  If there is a cancellation, he can be seen sooner.

## 2012-12-12 NOTE — Telephone Encounter (Signed)
Pt's wife called and said he was recently in the hospital and had an Abdominal US and they never heard from results. ( Looks like it was ordered by Gerrit Halls, NP). Wife said he is still having the same abdominal pain and has an appt on 12/26/2012 but would be glad to come in earlier if we have a cancellation. Please advise!

## 2012-12-17 LAB — CBC WITH DIFFERENTIAL/PLATELET
Basophils Relative: 0 % (ref 0–1)
Eosinophils Absolute: 0.1 10*3/uL (ref 0.0–0.7)
Eosinophils Relative: 1 % (ref 0–5)
Hemoglobin: 14.1 g/dL (ref 13.0–17.0)
Lymphs Abs: 3 10*3/uL (ref 0.7–4.0)
MCH: 31.1 pg (ref 26.0–34.0)
MCHC: 34.6 g/dL (ref 30.0–36.0)
MCV: 90.1 fL (ref 78.0–100.0)
Monocytes Relative: 9 % (ref 3–12)
Neutrophils Relative %: 53 % (ref 43–77)
Platelets: 373 10*3/uL (ref 150–400)
RBC: 4.53 MIL/uL (ref 4.22–5.81)

## 2012-12-26 ENCOUNTER — Ambulatory Visit (INDEPENDENT_AMBULATORY_CARE_PROVIDER_SITE_OTHER): Payer: Medicaid Other | Admitting: Gastroenterology

## 2012-12-26 ENCOUNTER — Encounter: Payer: Self-pay | Admitting: Gastroenterology

## 2012-12-26 VITALS — BP 121/75 | HR 82 | Temp 97.3°F | Ht 74.0 in | Wt 220.2 lb

## 2012-12-26 DIAGNOSIS — K7689 Other specified diseases of liver: Secondary | ICD-10-CM

## 2012-12-26 DIAGNOSIS — K760 Fatty (change of) liver, not elsewhere classified: Secondary | ICD-10-CM

## 2012-12-26 DIAGNOSIS — R1031 Right lower quadrant pain: Secondary | ICD-10-CM

## 2012-12-26 DIAGNOSIS — K219 Gastro-esophageal reflux disease without esophagitis: Secondary | ICD-10-CM

## 2012-12-26 NOTE — Progress Notes (Signed)
Referring Provider: Tylene Fantasia., PA-C Primary Care Physician:  Rudi Heap, MD Primary Gastroenterologist: Dr. Jena Gauss (was assigned to Dr. Darrick Penna in June 2013 but has never seen her in the office, pt ended up having procedure with [facility]. Will remain with [facility] now).   Chief Complaint  Patient presents with  . Abdominal Pain    right side  . Follow-up    ct    HPI:   Dustin Cruz presents today in hospital follow-up secondary to acute chest pain, N/V in the setting of Effient, aspirin, and NSAIDs. Last EGD by Dr. Jena Gauss 04/2012 showed mild erosive reflux esophagitis/gastritis. [place] of abdomen showed possible fatty liver, otherwise no gallstones. LFTs normal. Most recent CBC without anemia.  Reports RLQ pain, feels like a knot, spreading across side. If he sits back and relaxes, eases. Intermittent. No constipation/diarrhea. BM every day to every 2 days. No rectal bleeding. No changes in bowel habits. Sometimes pain worse with walking. Sometimes hurts worse when he eats. Pain present X 6 months. No prior colonoscopy.   N/V resolved. Chest pain resolved. On Prilosec BID. Likes fried foods, hamburgers, hotdogs, french fries.   Past Medical History  Diagnosis Date  . Diabetes mellitus     Type 2  . Hyperlipidemia   . Depression   . Coronary artery disease     s/p BMS to Ramus 10/2010;  Cath 01/22/12 prox 30-40% LAD, LCx ramus w/ hazy 70-80% in-stent restenosis, EF 60% treated medically  . HTN (hypertension)   . Tobacco abuse   . Obesity   . Anxiety   . Melanoma 2007    surgery at [facility], Followed by [facility]  . Myocardial infarction 2012  . GERD (gastroesophageal reflux disease)   . Hyperlipidemia   . Asthma   . Coronary artery disease     s/p BMS to Ramus 10/2010;  Cath 01/22/12 prox 30-40% LAD, LCx ramus w/ hazy 70-80% in-stent restenosis, EF 60% treated medically   . Major depression, chronic 08/13/2012    Past Surgical History  Procedure Laterality Date  . Melanoma surgery  2007     [facility], removed lymph nodes under arm as well,Left abd  . Cardiac catheterization      with stent  . Esophagogastroduodenoscopy  04/24/2012    Rourk-mild erosive reflux esophagitis,dilated w/85F Elease Hashimoto, small HH, minimal chronic gastric/bulbar erosions(No H pylori)    Current Outpatient Prescriptions  Medication Sig Dispense Refill  . albuterol (PROVENTIL HFA;VENTOLIN HFA) 108 (90 BASE) MCG/ACT inhaler Inhale 2 puffs into the lungs every 6 (six) hours as needed for wheezing.  1 Inhaler  2  . albuterol (PROVENTIL) (2.5 MG/3ML) 0.083% nebulizer solution Take 2.5 mg by nebulization 2 (two) times daily as needed for wheezing or shortness of breath.      [name] aspirin EC 81 MG EC tablet Take 1 tablet (81 mg total) by mouth daily.      . fenofibrate 160 MG tablet Take 1 tablet (160 mg total) by mouth daily.  30 tablet  0  . gabapentin (NEURONTIN) 400 MG capsule Take 1 capsule (400 mg total) by mouth 3 (three) times daily.  90 capsule  3  . HYDROcodone-acetaminophen (NORCO/VICODIN) 5-325 MG per tablet Take 1-2 tablets by mouth every 4 (four) hours as needed.  15 tablet  0  . insulin glargine (LANTUS SOLOSTAR) 100 UNIT/ML injection Inject 25 Units into the skin at bedtime.  10 mL  0  . lisinopril (PRINIVIL,ZESTRIL) 2.5 MG tablet Take 2.5 mg by mouth daily.      [name]  metFORMIN (GLUCOPHAGE) 1000 MG tablet Take 1 tablet (1,000 mg total) by mouth 2 (two) times daily with a meal. HOLD for 48 hours, restart on April 18th.      . metoprolol succinate (TOPROL-XL) 25 MG 24 hr tablet Take 1 tablet (25 mg total) by mouth daily.  30 tablet  6  . mometasone-formoterol (DULERA) 100-5 MCG/ACT AERO Inhale 2 puffs into the lungs 2 (two) times daily.  1 Inhaler  0  . nitroGLYCERIN (NITROSTAT) 0.4 MG SL tablet Place 1 tablet (0.4 mg total) under the tongue every 5 (five) minutes as needed. For chest pain.  25 tablet  3  . omeprazole (PRILOSEC) 40 MG capsule Take 1 capsule (40 mg total) by mouth 2 (two) times daily.  60 capsule  0   . ondansetron (ZOFRAN ODT) 4 MG disintegrating tablet Take 1 tablet (4 mg total) by mouth every 8 (eight) hours as needed for nausea.  10 tablet  0  . prasugrel (EFFIENT) 10 MG TABS Take 1 tablet (10 mg total) by mouth daily.  30 tablet  6  . promethazine (PHENERGAN) 25 MG tablet Take 1 tablet (25 mg total) by mouth every 6 (six) hours as needed for nausea.  12 tablet  0  . rosuvastatin (CRESTOR) 20 MG tablet Take 20 mg by mouth daily.      . sertraline (ZOLOFT) 50 MG tablet Take 1 tablet (50 mg total) by mouth daily.  30 tablet  0  . traZODone (DESYREL) 50 MG tablet Take 50 mg by mouth at bedtime.      . [DISCONTINUED] metoprolol tartrate (LOPRESSOR) 25 MG tablet Take 25 mg by mouth 2 (two) times daily.         No current facility-administered medications for this visit.    Allergies as of 12/26/2012  . (No Known Allergies)    Family History  Problem Relation Age of Onset  . Lung cancer    . Heart disease    . Other      not real familiar with family history    History   Social History  . Marital Status: Married    Spouse Name: rayann Belmar    Number of Children: 0  . Years of Education: N/A   Occupational History  . disabled    Social History Main Topics  . Smoking status: Current Every Day Smoker -- 0.50 packs/day for 30 years    Types: Cigarettes  . Smokeless tobacco: Never Used  . 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 Drug Use: No  . Sexually Active: Yes   Other Topics Concern  . None   Social History Narrative   Lives w/ wife    Review of Systems: Negative unless mentioned in HPI.   Physical Exam: BP 121/75  Pulse 82  Temp(Src) 97.3 F (36.3 C) (Oral)  Ht 6\' 2"  (1.88 m)  Wt 220 lb 3.2 oz (99.882 kg)  BMI 28.26 kg/m2 General:   Alert and oriented. No distress noted. Pleasant and cooperative.  Head:  Normocephalic and atraumatic. Eyes:  Conjuctiva clear without scleral icterus. Mouth:  Oral mucosa pink and moist.  Good dentition. No lesions. Neck:  Supple, without mass or thyromegaly. Heart:  S1, S2 present without murmurs, rubs, or gallops. Regular rate and rhythm. Abdomen:  +BS, soft, TTP RLQ, +CARNETT'S and non-distended. No rebound or guarding. No HSM or masses noted. Msk:  Symmetrical without gross deformities. Normal posture Extremities:  Without edema. Neurologic:  Alert and  oriented x4;  grossly normal neurologically. Skin:  Intact without significant lesions or rashes. Cervical Nodes:  No significant cervical adenopathy. Psych:  Alert and cooperative. Normal mood and affect.

## 2012-12-26 NOTE — Patient Instructions (Addendum)
Please complete the stool sample and return to our office.   We have scheduled a CT of your belly in the near future.   Please follow the low-fat diet. This is helpful for your liver.   Further recommendations to follow shortly.   Fat and Cholesterol Control Diet Cholesterol levels in your body are determined significantly by your diet. Cholesterol levels may also be related to heart disease. The following material helps to explain this relationship and discusses what you can do to help keep your heart healthy. Not all cholesterol is bad. Low-density lipoprotein (LDL) cholesterol is the "bad" cholesterol. It may cause fatty deposits to build up inside your arteries. High-density lipoprotein (HDL) cholesterol is "good." It helps to remove the "bad" LDL cholesterol from your blood. Cholesterol is a very important risk factor for heart disease. Other risk factors are high blood pressure, smoking, stress, heredity, and weight. The heart muscle gets its supply of blood through the coronary arteries. If your LDL cholesterol is high and your HDL cholesterol is low, you are at risk for having fatty deposits build up in your coronary arteries. This leaves less room through which blood can flow. Without sufficient blood and oxygen, the heart muscle cannot function properly and you may feel chest pains (angina pectoris). When a coronary artery closes up entirely, a part of the heart muscle may die causing a heart attack (myocardial infarction). CHECKING CHOLESTEROL When your caregiver sends your blood to a lab to be examined for cholesterol, a complete lipid (fat) profile may be done. With this test, the total amount of cholesterol and levels of LDL and HDL are determined. Triglycerides are a type of fat that circulates in the blood. They can also be used to determine heart disease risk. The list below describes what the numbers should be: Test: Total Cholesterol.  Less than 200 mg/dl. Test: LDL "bad  cholesterol."  Less than 100 mg/dl.  Less than 70 mg/dl if you are at very high risk of a heart attack or sudden cardiac death. Test: HDL "good cholesterol."  Greater than 50 mg/dl for women.  Greater than 40 mg/dl for men. Test: Triglycerides.  Less than 150 mg/dl. CONTROLLING CHOLESTEROL WITH DIET Although exercise and lifestyle factors are important, your diet is key. That is because certain foods are known to raise cholesterol and others to lower it. The goal is to balance foods for their effect on cholesterol and more importantly, to replace saturated and trans fat with other types of fat, such as monounsaturated fat, polyunsaturated fat, and omega-3 fatty acids. On average, a person should consume no more than 15 to 17 g of saturated fat daily. Saturated and trans fats are considered "bad" fats, and they will raise LDL cholesterol. Saturated fats are primarily found in animal products such as meats, butter, and cream. However, that does not mean you need to give up all your favorite foods. Today, there are good tasting, low-fat, low-cholesterol substitutes for most of the things you like to eat. Choose low-fat or nonfat alternatives. Choose round or loin cuts of red meat. These types of cuts are lowest in fat and cholesterol. Chicken (without the skin), fish, veal, and ground [place] breast are great choices. Eliminate fatty meats, such as hot dogs and salami. Even shellfish have little or no saturated fat. Have a 3 oz (85 g) portion when you eat lean meat, poultry, or fish. Trans fats are also called "partially hydrogenated oils." They are oils that have been scientifically manipulated so  that they are solid at room temperature resulting in a longer shelf life and improved taste and texture of foods in which they are added. Trans fats are found in stick margarine, some tub margarines, cookies, crackers, and baked goods.  When baking and cooking, oils are a great substitute for butter. The  monounsaturated oils are especially beneficial since it is believed they lower LDL and raise HDL. The oils you should avoid entirely are saturated tropical oils, such as coconut and palm.  Remember to eat a lot from food groups that are naturally free of saturated and trans fat, including fish, fruit, vegetables, beans, grains (barley, rice, couscous, bulgur wheat), and pasta (without cream sauces).  IDENTIFYING FOODS THAT LOWER CHOLESTEROL  Soluble fiber may lower your cholesterol. This type of fiber is found in fruits such as apples, vegetables such as broccoli, potatoes, and carrots, legumes such as beans, peas, and lentils, and grains such as barley. Foods fortified with plant sterols (phytosterol) may also lower cholesterol. You should eat at least 2 g per day of these foods for a cholesterol lowering effect.  Read package labels to identify low-saturated fats, trans fat free, and low-fat foods at the supermarket. Select cheeses that have only 2 to 3 g saturated fat per ounce. Use a heart-healthy tub margarine that is free of trans fats or partially hydrogenated oil. When buying baked goods (cookies, crackers), avoid partially hydrogenated oils. Breads and muffins should be made from whole grains (whole-wheat or whole oat flour, instead of "flour" or "enriched flour"). Buy non-creamy canned soups with reduced salt and no added fats.  FOOD PREPARATION TECHNIQUES  Never deep-fry. If you must fry, either stir-fry, which uses very little fat, or use non-stick cooking sprays. When possible, broil, bake, or roast meats, and steam vegetables. Instead of putting butter or margarine on vegetables, use lemon and herbs, applesauce, and cinnamon (for squash and sweet potatoes), nonfat yogurt, salsa, and low-fat dressings for salads.  LOW-SATURATED FAT / LOW-FAT FOOD SUBSTITUTES Meats / Saturated Fat (g)  Avoid: Steak, marbled (3 oz/85 g) / 11 g  Choose: Steak, lean (3 oz/85 g) / 4 g  Avoid: Hamburger (3 oz/85  g) / 7 g  Choose: Hamburger, lean (3 oz/85 g) / 5 g  Avoid: Ham (3 oz/85 g) / 6 g  Choose: Ham, lean cut (3 oz/85 g) / 2.4 g  Avoid: Chicken, with skin, dark meat (3 oz/85 g) / 4 g  Choose: Chicken, skin removed, dark meat (3 oz/85 g) / 2 g  Avoid: Chicken, with skin, light meat (3 oz/85 g) / 2.5 g  Choose: Chicken, skin removed, light meat (3 oz/85 g) / 1 g Dairy / Saturated Fat (g)  Avoid: Whole milk (1 cup) / 5 g  Choose: Low-fat milk, 2% (1 cup) / 3 g  Choose: Low-fat milk, 1% (1 cup) / 1.5 g  Choose: Skim milk (1 cup) / 0.3 g  Avoid: Hard cheese (1 oz/28 g) / 6 g  Choose: Skim milk cheese (1 oz/28 g) / 2 to 3 g  Avoid: Cottage cheese, 4% fat (1 cup) / 6.5 g  Choose: Low-fat cottage cheese, 1% fat (1 cup) / 1.5 g  Avoid: Ice cream (1 cup) / 9 g  Choose: Sherbet (1 cup) / 2.5 g  Choose: Nonfat frozen yogurt (1 cup) / 0.3 g  Choose: Frozen fruit bar / trace  Avoid: Whipped cream (1 tbs) / 3.5 g  Choose: Nondairy whipped topping (1 tbs) / 1  g Condiments / Saturated Fat (g)  Avoid: Mayonnaise (1 tbs) / 2 g  Choose: Low-fat mayonnaise (1 tbs) / 1 g  Avoid: Butter (1 tbs) / 7 g  Choose: Extra light margarine (1 tbs) / 1 g  Avoid: Coconut oil (1 tbs) / 11.8 g  Choose: Olive oil (1 tbs) / 1.8 g  Choose: Corn oil (1 tbs) / 1.7 g  Choose: Safflower oil (1 tbs) / 1.2 g  Choose: Sunflower oil (1 tbs) / 1.4 g  Choose: Soybean oil (1 tbs) / 2.4 g  Choose: Canola oil (1 tbs) / 1 g Document Released: 09/25/2005 Document Revised: 12/18/2011 Document Reviewed: 03/16/2011 [facility] Patient Information 2013 [place], [place].

## 2012-12-28 DIAGNOSIS — R1031 Right lower quadrant pain: Secondary | ICD-10-CM | POA: Insufficient documentation

## 2012-12-28 DIAGNOSIS — K760 Fatty (change of) liver, not elsewhere classified: Secondary | ICD-10-CM | POA: Insufficient documentation

## 2012-12-28 NOTE — Assessment & Plan Note (Signed)
47 year old male with RLQ pain worsened with movement, walking, sometimes eating. Symptoms present for 6 months. No changes in bowel habits or rectal bleeding. However, patient is unclear regarding his family history of any colorectal cancers. +Carnett's sign on exam. Unclear etiology at this point. Need to update CT abd/pelvis. He does have a history of melanoma, and he has been instructed to follow-up with his oncologist.   CT abd/pelvis Obtain ifobt. If positive, proceed with TCS

## 2012-12-28 NOTE — Assessment & Plan Note (Signed)
Noted on [place]. Normal LFTs on file. Low-fat diet provided. Repeat HFP in 1 year.

## 2012-12-28 NOTE — Assessment & Plan Note (Signed)
Last EGD on file with esophagitis. (2013). Improved symptoms with Prilosec BID. Continue current regimen.

## 2012-12-30 ENCOUNTER — Ambulatory Visit (HOSPITAL_COMMUNITY)
Admission: RE | Admit: 2012-12-30 | Discharge: 2012-12-30 | Disposition: A | Discharge status: Home / Self Care | Payer: Medicaid Other | Source: Ambulatory Visit | Attending: Gastroenterology | Admitting: Gastroenterology

## 2012-12-30 ENCOUNTER — Ambulatory Visit (INDEPENDENT_AMBULATORY_CARE_PROVIDER_SITE_OTHER): Payer: Medicaid Other | Admitting: Gastroenterology

## 2012-12-30 ENCOUNTER — Encounter (HOSPITAL_COMMUNITY): Payer: Self-pay

## 2012-12-30 DIAGNOSIS — R1031 Right lower quadrant pain: Secondary | ICD-10-CM | POA: Insufficient documentation

## 2012-12-30 MED ORDER — IOHEXOL 300 MG/ML  SOLN
100.0000 mL | Freq: Once | INTRAMUSCULAR | Status: AC | PRN
  Administered 2012-12-30: 100 mL via INTRAVENOUS

## 2012-12-30 NOTE — Progress Notes (Signed)
Faxed to PCP

## 2013-01-01 ENCOUNTER — Telehealth: Payer: Self-pay | Admitting: *Deleted

## 2013-01-01 NOTE — Telephone Encounter (Signed)
Pt informed may restart metformin medication.  [facility] radiology had patient stop medication due to using dye during procedure.

## 2013-01-06 ENCOUNTER — Telehealth: Payer: Self-pay

## 2013-01-06 NOTE — Progress Notes (Signed)
Quick Note:  No acute findings on CT abd/pelvis with contrast.  He has not had a colonoscopy. Negative ifobt on file. Unclear etiology; he still has his appendix. Next step would be lower GI evaluation in the way of a colonoscopy. I need to discuss with cardiology the best way to proceed as he is on Effient. May be able to hold several days.  Please let patient know we will be scheduling a colonoscopy in the near future. Don't schedule until I hear back from cardiology. ______

## 2013-01-06 NOTE — Telephone Encounter (Signed)
See result note.  

## 2013-01-06 NOTE — Telephone Encounter (Signed)
He is calling for his CT scan results. Please advise

## 2013-01-09 NOTE — Progress Notes (Signed)
Quick Note:  I reviewed with cardiology.  May hold Effient X 3 days.  We need to schedule for TCS with [facility]. I have put further instructions on office visit and routing to [employer]. ______

## 2013-01-09 NOTE — Progress Notes (Signed)
Negative ifobt.  Pt is on Effient, and this needs to be held X 3 days per Cardiology.  Needs TCS with Dr. Rourk with 25 mg IV on call due to polypharmacy.  1/2 dose of Lantus the evening prior.  No diabetes medication the day of.   

## 2013-01-10 ENCOUNTER — Other Ambulatory Visit: Payer: Self-pay | Admitting: Internal Medicine

## 2013-01-10 DIAGNOSIS — R1031 Right lower quadrant pain: Secondary | ICD-10-CM

## 2013-01-10 DIAGNOSIS — R109 Unspecified abdominal pain: Secondary | ICD-10-CM

## 2013-01-10 MED ORDER — PEG 3350-KCL-NA BICARB-NACL 420 G PO SOLR: 4000.0000 mL | ORAL | Status: DC

## 2013-01-10 NOTE — Progress Notes (Signed)
Patient is scheduled for TCS w/[facility] on April 24th and I have spoken to the patient regarding Effient and Diabetic medication directions as well as sending instructions in the mail

## 2013-01-16 ENCOUNTER — Encounter (HOSPITAL_COMMUNITY): Payer: Self-pay | Admitting: Pharmacy Technician

## 2013-01-23 ENCOUNTER — Other Ambulatory Visit: Payer: Self-pay | Admitting: Internal Medicine

## 2013-01-23 ENCOUNTER — Telehealth: Payer: Self-pay

## 2013-01-23 MED ORDER — PEG 3350-KCL-NA BICARB-NACL 420 G PO SOLR: 4000.0000 mL | ORAL | Status: DC

## 2013-01-23 NOTE — Telephone Encounter (Signed)
Negative ifobt.  Pt is on Effient, and this needs to be held X 3 days per Cardiology.  Needs TCS with Dr. Jena Gauss with 25 mg IV on call due to polypharmacy.  1/2 dose of Lantus the evening prior.  No diabetes medication the day of.

## 2013-01-23 NOTE — Telephone Encounter (Signed)
Called and pt is aware of the instructions.

## 2013-01-23 NOTE — Telephone Encounter (Signed)
Pt is scheduled for colonoscopy on 01/27/2013 with [facility]. I called to update meds. He has had no new problems and no changes in his medications since his office visit on 12/26/2012.

## 2013-01-27 ENCOUNTER — Ambulatory Visit (HOSPITAL_COMMUNITY)
Admission: RE | Admit: 2013-01-27 | Discharge: 2013-01-27 | Disposition: A | Discharge status: Home / Self Care | Payer: Medicaid Other | Source: Ambulatory Visit | Attending: Internal Medicine | Admitting: Internal Medicine

## 2013-01-27 ENCOUNTER — Encounter (HOSPITAL_COMMUNITY)
Admission: RE | Disposition: A | Discharge status: Home / Self Care | Payer: Self-pay | Source: Ambulatory Visit | Attending: Internal Medicine

## 2013-01-27 ENCOUNTER — Encounter (HOSPITAL_COMMUNITY): Payer: Self-pay | Admitting: *Deleted

## 2013-01-27 DIAGNOSIS — D128 Benign neoplasm of rectum: Secondary | ICD-10-CM | POA: Insufficient documentation

## 2013-01-27 DIAGNOSIS — R1031 Right lower quadrant pain: Secondary | ICD-10-CM

## 2013-01-27 DIAGNOSIS — E119 Type 2 diabetes mellitus without complications: Secondary | ICD-10-CM | POA: Insufficient documentation

## 2013-01-27 DIAGNOSIS — D129 Benign neoplasm of anus and anal canal: Secondary | ICD-10-CM | POA: Insufficient documentation

## 2013-01-27 DIAGNOSIS — D126 Benign neoplasm of colon, unspecified: Secondary | ICD-10-CM

## 2013-01-27 DIAGNOSIS — Z1211 Encounter for screening for malignant neoplasm of colon: Secondary | ICD-10-CM

## 2013-01-27 DIAGNOSIS — K621 Rectal polyp: Secondary | ICD-10-CM

## 2013-01-27 DIAGNOSIS — I10 Essential (primary) hypertension: Secondary | ICD-10-CM | POA: Insufficient documentation

## 2013-01-27 DIAGNOSIS — R109 Unspecified abdominal pain: Secondary | ICD-10-CM

## 2013-01-27 DIAGNOSIS — Z01812 Encounter for preprocedural laboratory examination: Secondary | ICD-10-CM | POA: Insufficient documentation

## 2013-01-27 DIAGNOSIS — K620 Anal polyp: Secondary | ICD-10-CM

## 2013-01-27 HISTORY — PX: COLONOSCOPY: SHX5424

## 2013-01-27 LAB — GLUCOSE, CAPILLARY: Glucose-Capillary: 88 mg/dL (ref 70–99)

## 2013-01-27 SURGERY — COLONOSCOPY
Anesthesia: Moderate Sedation

## 2013-01-27 MED ORDER — SODIUM CHLORIDE 0.9 % IV SOLN
INTRAVENOUS | Status: DC
  Administered 2013-01-27: 14:00:00 via INTRAVENOUS

## 2013-01-27 MED ORDER — MIDAZOLAM HCL 5 MG/5ML IJ SOLN
INTRAMUSCULAR | Status: AC
  Filled 2013-01-27: qty 10

## 2013-01-27 MED ORDER — MEPERIDINE HCL 100 MG/ML IJ SOLN
INTRAMUSCULAR | Status: DC | PRN
  Administered 2013-01-27: 50 mg via INTRAVENOUS

## 2013-01-27 MED ORDER — ONDANSETRON HCL 4 MG/2ML IJ SOLN
INTRAMUSCULAR | Status: AC
  Filled 2013-01-27: qty 2

## 2013-01-27 MED ORDER — SODIUM CHLORIDE 0.9 % IJ SOLN
INTRAMUSCULAR | Status: AC
  Filled 2013-01-27: qty 10

## 2013-01-27 MED ORDER — MIDAZOLAM HCL 5 MG/5ML IJ SOLN
INTRAMUSCULAR | Status: DC | PRN
  Administered 2013-01-27 (×2): 2 mg via INTRAVENOUS
  Administered 2013-01-27: 1 mg via INTRAVENOUS

## 2013-01-27 MED ORDER — DEXTROSE-NACL 5-0.9 % IV SOLN
Freq: Once | INTRAVENOUS | Status: AC
Start: 2013-01-27 — End: 2013-01-27
  Administered 2013-01-27: 14:00:00 via INTRAVENOUS

## 2013-01-27 MED ORDER — MEPERIDINE HCL 100 MG/ML IJ SOLN
INTRAMUSCULAR | Status: AC
  Filled 2013-01-27: qty 2

## 2013-01-27 MED ORDER — PROMETHAZINE HCL 25 MG/ML IJ SOLN
25.0000 mg | Freq: Once | INTRAMUSCULAR | Status: AC
  Administered 2013-01-27: 25 mg via INTRAVENOUS

## 2013-01-27 MED ORDER — ONDANSETRON HCL 4 MG/2ML IJ SOLN
INTRAMUSCULAR | Status: DC | PRN
  Administered 2013-01-27: 4 mg via INTRAVENOUS

## 2013-01-27 MED ORDER — PROMETHAZINE HCL 25 MG/ML IJ SOLN
INTRAMUSCULAR | Status: AC
  Filled 2013-01-27: qty 1

## 2013-01-27 MED ORDER — STERILE WATER FOR IRRIGATION IR SOLN
Status: DC | PRN
  Administered 2013-01-27: 15:00:00

## 2013-01-27 NOTE — H&P (View-Only) (Signed)
Quick Note:  No acute findings on CT abd/pelvis with contrast.  He has not had a colonoscopy. Negative ifobt on file. Unclear etiology; he still has his appendix. Next step would be lower GI evaluation in the way of a colonoscopy. I need to discuss with cardiology the best way to proceed as he is on Effient. May be able to hold several days.  Please let patient know we will be scheduling a colonoscopy in the near future. Don't schedule until I hear back from cardiology. ______ 

## 2013-01-27 NOTE — Interval H&P Note (Signed)
History and Physical Interval Note:  01/27/2013 3:25 PM  Dustin Cruz  has presented today for surgery, with the diagnosis of Abdominal Pain , RLQ PAIN  The various methods of treatment have been discussed with the patient and family. After consideration of risks, benefits and other options for treatment, the patient has consented to  Procedure(s) with comments: COLONOSCOPY (N/A) - 2:00-moved to [address] to notify pt as a surgical intervention .  The patient's history has been reviewed, patient examined, no change in status, stable for surgery.  I have reviewed the patient's chart and labs.  Questions were answered to the patient's satisfaction.     Dustin Cruz  CT negative as the cause of the right lower quadrant abdominal pain. Patient states pain is intermittent does not have any pain today. He has a benign abdominal examination today. Colonoscopy, first-ever exam, being done today per plan.  Effient held x3 days.  The risks, benefits, limitations, alternatives and imponderables have been reviewed with the patient. Questions have been answered. All parties are agreeable.

## 2013-01-27 NOTE — Op Note (Signed)
[facility] [address] [place], [address]   COLONOSCOPY PROCEDURE REPORT  PATIENT: Dustin Cruz, Dustin Cruz  MR#:         [id number] BIRTHDATE: 01-23-1966 , 46  yrs. old GENDER: Male ENDOSCOPIST: R.  Roetta Sessions, MD FACP FACG REFERRED BY:  Rudi Heap, M.D. PROCEDURE DATE:  01/27/2013 PROCEDURE:     Colonoscopy with biopsy and snare polypectomy  INDICATIONS: First ever screening colonoscopy  INFORMED CONSENT:  The risks, benefits, alternatives and imponderables including but not limited to bleeding, perforation as well as the possibility of a missed lesion have been reviewed.  The potential for biopsy, lesion removal, etc. have also been discussed.  Questions have been answered.  All parties agreeable. Please see the history and physical in the medical record for more information.  MEDICATIONS: Versed 5 mg IV and Demerol 100 mg IV in divided doses. Zofran 4 mg  DESCRIPTION OF PROCEDURE:  After a digital rectal exam was performed, the EC-[id number] ([id number])  colonoscope was advanced from the anus through the rectum and colon to the area of the cecum, ileocecal valve and appendiceal orifice.  The cecum was deeply intubated.  These structures were well-seen and photographed for the record.  From the level of the cecum and ileocecal valve, the scope was slowly and cautiously withdrawn.  The mucosal surfaces were carefully surveyed utilizing scope tip deflection to facilitate fold flattening as needed.  The scope was pulled down into the rectum where a thorough examination including retroflexion was performed.    FINDINGS:  inadequate preparations as far as polyp detection concerned.  Normal rectum aside from a single diminutive polyp at 5 cm in from the anal verge. Patient had a 6 mm polyp in the descending segment. There was a single diminutive polyp at the splenic flexure; however, the remainder of the colonic mucosa appeared grossly normal. I did attempt to  intubate the terminal ileum but was unable to do so.  THERAPEUTIC / DIAGNOSTIC MANEUVERS PERFORMED:  the rectal and splenic flexure polyps were cold biopsied/removed. The descending colon polyp was cold snare removed.  COMPLICATIONS: none  CECAL WITHDRAWAL TIME:  13 minutes  IMPRESSION:  Rectal and colonic polyps-removed as described above  RECOMMENDATIONS:   Followup on pathology.  Resume Effient tomorrow.   _______________________________ eSigned:  R. Roetta Sessions, MD FACP [facility] 01/27/2013 4:09 PM   CC:    PATIENT NAME:  Dustin Cruz, Dustin Cruz MR#: [id number]

## 2013-01-31 ENCOUNTER — Encounter: Payer: Self-pay | Admitting: Internal Medicine

## 2013-02-03 ENCOUNTER — Encounter (HOSPITAL_COMMUNITY): Payer: Self-pay | Admitting: Internal Medicine

## 2013-03-12 ENCOUNTER — Ambulatory Visit (INDEPENDENT_AMBULATORY_CARE_PROVIDER_SITE_OTHER): Payer: Self-pay | Admitting: Cardiology

## 2013-03-12 ENCOUNTER — Encounter: Payer: Self-pay | Admitting: Cardiology

## 2013-03-12 VITALS — BP 136/84 | HR 88 | Ht 74.0 in | Wt 220.5 lb

## 2013-03-12 DIAGNOSIS — F17200 Nicotine dependence, unspecified, uncomplicated: Secondary | ICD-10-CM

## 2013-03-12 DIAGNOSIS — E119 Type 2 diabetes mellitus without complications: Secondary | ICD-10-CM

## 2013-03-12 DIAGNOSIS — E7800 Pure hypercholesterolemia, unspecified: Secondary | ICD-10-CM

## 2013-03-12 DIAGNOSIS — Z720 Tobacco use: Secondary | ICD-10-CM

## 2013-03-12 DIAGNOSIS — I10 Essential (primary) hypertension: Secondary | ICD-10-CM

## 2013-03-12 DIAGNOSIS — I2510 Atherosclerotic heart disease of native coronary artery without angina pectoris: Secondary | ICD-10-CM

## 2013-03-12 DIAGNOSIS — E785 Hyperlipidemia, unspecified: Secondary | ICD-10-CM

## 2013-03-12 MED ORDER — ATORVASTATIN CALCIUM 40 MG PO TABS: 40.0000 mg | ORAL_TABLET | Freq: Every day | ORAL | Status: DC

## 2013-03-12 MED ORDER — LISINOPRIL 20 MG PO TABS: 20.0000 mg | ORAL_TABLET | Freq: Every day | ORAL | Status: DC

## 2013-03-12 NOTE — Patient Instructions (Signed)
STOP TAKING Effient, Fenofibrate  Start Atorvastatin 40mg  daily  Increase Lisinopril to 20mg  daily  FASTING LABS IN 1 MONTH:  BMET,  Fasting Lipid Panel  Your physician recommends that you schedule a follow-up appointment in 2 months with the nurse for a medication review and blood pressure check.  Your physician wants you to follow-up in: 6 months with Dr. Dietrich Pates.  You will receive a reminder letter in the mail two months in advance. If you don't receive a letter, please call our office to schedule the follow-up appointment.

## 2013-03-12 NOTE — Assessment & Plan Note (Addendum)
Patient has no interest in considering discontinuation of cigarette smoking.  There is likely a component of COPD with bronchospasm. Treatment with beta blocker is not necessary from a cardiac standpoint and will be discontinued in an attempt to improve pulmonary function. He uses albuterol as needed, which is relatively infrequently.

## 2013-03-12 NOTE — Progress Notes (Signed)
Name: CLOYD RAGAS    DOB: 12-Oct-1965  Age: 47 y.o.  MR#: [id number]       PCP:  Rudi Heap, MD        HPI: Schedule return visit for this matter-of-fact but very nice young gentleman with known coronary artery disease. He was found to have critical stenosis of a small ramus in 2011 and underwent stent placement with a bare-metal device. Repeat catheterization 1 year ago showed in-stent stenosis of moderate severity at the vessel ostium. Medical therapy was advised that due to the small size of the vessel. He has been asymptomatic of late although he is relatively inactive. He continues to smoke cigarettes at the rate of one Radebaugh per day, but experiences only modest wheezing.  Current Outpatient Prescriptions  Medication Sig Dispense Refill  . albuterol (PROVENTIL HFA;VENTOLIN HFA) 108 (90 BASE) MCG/ACT inhaler Inhale 2 puffs into the lungs every 6 (six) hours as needed for wheezing.  1 Inhaler  2  . albuterol (PROVENTIL) (2.5 MG/3ML) 0.083% nebulizer solution Take 2.5 mg by nebulization 2 (two) times daily as needed for wheezing or shortness of breath.      [name] aspirin EC 81 MG EC tablet Take 1 tablet (81 mg total) by mouth daily.      [name] gabapentin (NEURONTIN) 400 MG capsule Take 1 capsule (400 mg total) by mouth 3 (three) times daily.  90 capsule  3  . HYDROcodone-acetaminophen (NORCO/VICODIN) 5-325 MG per tablet Take 1-2 tablets by mouth every 4 (four) hours as needed.  15 tablet  0  . insulin glargine (LANTUS SOLOSTAR) 100 UNIT/ML injection Inject 25 Units into the skin at bedtime.  10 mL  0  . lisinopril (PRINIVIL,ZESTRIL) 20 MG tablet Take 1 tablet (20 mg total) by mouth daily.  90 tablet  3  . metFORMIN (GLUCOPHAGE) 1000 MG tablet Take 1 tablet (1,000 mg total) by mouth 2 (two) times daily with a meal. HOLD for 48 hours, restart on April 18th.      . metoprolol succinate (TOPROL-XL) 25 MG 24 hr tablet Take 1 tablet (25 mg total) by mouth daily.  30 tablet  6  . mometasone-formoterol (DULERA)  100-5 MCG/ACT AERO Inhale 2 puffs into the lungs 2 (two) times daily.  1 Inhaler  0  . nitroGLYCERIN (NITROSTAT) 0.4 MG SL tablet Place 1 tablet (0.4 mg total) under the tongue every 5 (five) minutes as needed. For chest pain.  25 tablet  3  . omeprazole (PRILOSEC) 40 MG capsule Take 1 capsule (40 mg total) by mouth 2 (two) times daily.  60 capsule  0  . ondansetron (ZOFRAN ODT) 4 MG disintegrating tablet Take 1 tablet (4 mg total) by mouth every 8 (eight) hours as needed for nausea.  10 tablet  0  . polyethylene glycol-electrolytes (TRILYTE) 420 G solution Take 4,000 mLs by mouth as directed.  4000 mL  0  . sertraline (ZOLOFT) 50 MG tablet Take 1 tablet (50 mg total) by mouth daily.  30 tablet  0  . traZODone (DESYREL) 50 MG tablet Take 50 mg by mouth at bedtime.      [name] atorvastatin (LIPITOR) 40 MG tablet Take 1 tablet (40 mg total) by mouth daily.  90 tablet  3  . [DISCONTINUED] metoprolol tartrate (LOPRESSOR) 25 MG tablet Take 25 mg by mouth 2 (two) times daily.         No current facility-administered medications for this visit.   No Known Allergies    Past medical history,  social history, and family history reviewed and updated.  ROS: Denies orthopnea, PND, pedal edema, chest discomfort, palpitations or syncope. All other systems reviewed and are negative.  PHYSICAL EXAM: BP 136/84  Pulse 88  Ht 6\' 2"  (1.88 m)  Wt 100.018 kg (220 lb 8 oz)  BMI 28.3 kg/m2  SpO2 96%;  Body mass index is 28.3 kg/(m^2). General-Well developed; no acute distress Body habitus-mildly overweight Neck-No JVD; no carotid bruits Lungs-inspiratory rhonchi; resonant to percussion; some prolongation of the expiratory phase Cardiovascular-normal PMI; normal S1 and S2; minimal systolic murmur Abdomen-normal bowel sounds; soft and non-tender without masses or organomegaly; aortic pulsation not palpable Musculoskeletal-No deformities, no cyanosis or clubbing Neurologic-Normal cranial nerves; symmetric strength  and tone Skin-Warm, no significant lesions Extremities-distal pulses intact; no edema  Slaton Bing, MD 03/12/2013  12:25 PM  ASSESSMENT AND PLAN

## 2013-03-12 NOTE — Assessment & Plan Note (Signed)
Marked improvement in diabetic control with the introduction of Lantus insulin. PCP is managing.

## 2013-03-12 NOTE — Assessment & Plan Note (Signed)
Blood pressure control has been adequate, but not ideal in light of history of coronary disease and diabetes. Lisinopril dosage will be increased to 20 mg per day.

## 2013-03-12 NOTE — Assessment & Plan Note (Signed)
Patient has discontinued Effient on at least 2 occasions due to cost. It is unclear why he has been maintained on this medication, possibly to prevent progression of single vessel coronary disease. Benefit is speculative, and risk of significant complication if vessel totally obstructs appears to be small due to modest myocardium supplied by that vessel. Accordingly, Effient will be discontinued and medical therapy adjusted to provide optimal management of cardiovascular risk factors.

## 2013-03-12 NOTE — Assessment & Plan Note (Signed)
Excellent control of hyperlipidemia in the past with rosuvastatin, which patient has discontinued. Atorvastatin 40 mg per day will be started and lipids reassessed in one month.

## 2013-03-13 ENCOUNTER — Other Ambulatory Visit: Payer: Self-pay | Admitting: *Deleted

## 2013-03-13 MED ORDER — METFORMIN HCL 1000 MG PO TABS: 1000.0000 mg | ORAL_TABLET | Freq: Two times a day (BID) | ORAL | Status: DC

## 2013-03-14 NOTE — Addendum Note (Signed)
Addended by: Derry Lory A on: 03/14/2013 03:27 PM   Modules accepted: Orders

## 2013-03-19 ENCOUNTER — Ambulatory Visit (INDEPENDENT_AMBULATORY_CARE_PROVIDER_SITE_OTHER): Payer: Medicare Other | Admitting: Family Medicine

## 2013-03-19 ENCOUNTER — Encounter: Payer: Self-pay | Admitting: Family Medicine

## 2013-03-19 VITALS — BP 120/80 | HR 89 | Temp 99.3°F | Ht 74.0 in | Wt 218.4 lb

## 2013-03-19 DIAGNOSIS — J449 Chronic obstructive pulmonary disease, unspecified: Secondary | ICD-10-CM

## 2013-03-19 DIAGNOSIS — Z720 Tobacco use: Secondary | ICD-10-CM

## 2013-03-19 DIAGNOSIS — Z9861 Coronary angioplasty status: Secondary | ICD-10-CM

## 2013-03-19 DIAGNOSIS — Z955 Presence of coronary angioplasty implant and graft: Secondary | ICD-10-CM

## 2013-03-19 DIAGNOSIS — E785 Hyperlipidemia, unspecified: Secondary | ICD-10-CM

## 2013-03-19 DIAGNOSIS — G609 Hereditary and idiopathic neuropathy, unspecified: Secondary | ICD-10-CM

## 2013-03-19 DIAGNOSIS — E119 Type 2 diabetes mellitus without complications: Secondary | ICD-10-CM

## 2013-03-19 DIAGNOSIS — F17200 Nicotine dependence, unspecified, uncomplicated: Secondary | ICD-10-CM

## 2013-03-19 LAB — COMPLETE METABOLIC PANEL WITH GFR
ALT: 21 U/L (ref 0–53)
AST: 16 U/L (ref 0–37)
Albumin: 4.8 g/dL (ref 3.5–5.2)
Alkaline Phosphatase: 50 U/L (ref 39–117)
BUN: 20 mg/dL (ref 6–23)
CO2: 25 mEq/L (ref 19–32)
Calcium: 9.7 mg/dL (ref 8.4–10.5)
Chloride: 100 mEq/L (ref 96–112)
Creat: 1.13 mg/dL (ref 0.50–1.35)
GFR, Est African American: 89 mL/min
GFR, Est Non African American: 77 mL/min
Glucose, Bld: 129 mg/dL — ABNORMAL HIGH (ref 70–99)
Potassium: 4.7 mEq/L (ref 3.5–5.3)
Sodium: 137 mEq/L (ref 135–145)
Total Bilirubin: 0.7 mg/dL (ref 0.3–1.2)
Total Protein: 7 g/dL (ref 6.0–8.3)

## 2013-03-19 LAB — POCT CBC
Granulocyte percent: 64.2 %G (ref 37–80)
HCT, POC: 47.6 % (ref 43.5–53.7)
Hemoglobin: 16.3 g/dL (ref 14.1–18.1)
Lymph, poc: 2.4 (ref 0.6–3.4)
MCH, POC: 32.2 pg — AB (ref 27–31.2)
MCHC: 34.2 g/dL (ref 31.8–35.4)
MCV: 93.9 fL (ref 80–97)
MPV: 8.2 fL (ref 0–99.8)
POC Granulocyte: 5.2 (ref 2–6.9)
POC LYMPH PERCENT: 30 %L (ref 10–50)
Platelet Count, POC: 336 10*3/uL (ref 142–424)
RBC: 5.1 M/uL (ref 4.69–6.13)
RDW, POC: 13.3 %
WBC: 8.1 10*3/uL (ref 4.6–10.2)

## 2013-03-19 LAB — POCT GLYCOSYLATED HEMOGLOBIN (HGB A1C): Hemoglobin A1C: 6.9

## 2013-03-19 MED ORDER — GABAPENTIN 400 MG PO CAPS: ORAL_CAPSULE | ORAL | Status: DC

## 2013-03-19 MED ORDER — INSULIN GLARGINE 100 UNIT/ML ~~LOC~~ SOLN: 25.0000 [IU] | Freq: Every day | SUBCUTANEOUS | Status: DC

## 2013-03-19 MED ORDER — METFORMIN HCL 1000 MG PO TABS: 1000.0000 mg | ORAL_TABLET | Freq: Two times a day (BID) | ORAL | Status: DC

## 2013-03-19 NOTE — Patient Instructions (Signed)
Smoking Cessation Quitting smoking is important to your health and has many advantages. However, it is not always easy to quit since nicotine is a very addictive drug. Often times, people try 3 times or more before being able to quit. This document explains the best ways for you to prepare to quit smoking. Quitting takes hard work and a lot of effort, but you can do it. ADVANTAGES OF QUITTING SMOKING  You will live longer, feel better, and live better.  Your body will feel the impact of quitting smoking almost immediately.  Within 20 minutes, blood pressure decreases. Your pulse returns to its normal level.  After 8 hours, carbon monoxide levels in the blood return to normal. Your oxygen level increases.  After 24 hours, the chance of having a heart attack starts to decrease. Your breath, hair, and body stop smelling like smoke.  After 48 hours, damaged nerve endings begin to recover. Your sense of taste and smell improve.  After 72 hours, the body is virtually free of nicotine. Your bronchial tubes relax and breathing becomes easier.  After 2 to 12 weeks, lungs can hold more air. Exercise becomes easier and circulation improves.  The risk of having a heart attack, stroke, cancer, or lung disease is greatly reduced.  After 1 year, the risk of coronary heart disease is cut in half.  After 5 years, the risk of stroke falls to the same as a nonsmoker.  After 10 years, the risk of lung cancer is cut in half and the risk of other cancers decreases significantly.  After 15 years, the risk of coronary heart disease drops, usually to the level of a nonsmoker.  If you are pregnant, quitting smoking will improve your chances of having a healthy baby.  The people you live with, especially any children, will be healthier.  You will have extra money to spend on things other than cigarettes. QUESTIONS TO THINK ABOUT BEFORE ATTEMPTING TO QUIT You may want to talk about your answers with your  caregiver.  Why do you want to quit?  If you tried to quit in the past, what helped and what did not?  What will be the most difficult situations for you after you quit? How will you plan to handle them?  Who can help you through the tough times? Your family? Friends? A caregiver?  What pleasures do you get from smoking? What ways can you still get pleasure if you quit? Here are some questions to ask your caregiver:  How can you help me to be successful at quitting?  What medicine do you think would be best for me and how should I take it?  What should I do if I need more help?  What is smoking withdrawal like? How can I get information on withdrawal? GET READY  Set a quit date.  Change your environment by getting rid of all cigarettes, ashtrays, matches, and lighters in your home, car, or work. Do not let people smoke in your home.  Review your past attempts to quit. Think about what worked and what did not. GET SUPPORT AND ENCOURAGEMENT You have a better chance of being successful if you have help. You can get support in many ways.  Tell your family, friends, and co-workers that you are going to quit and need their support. Ask them not to smoke around you.  Get individual, group, or telephone counseling and support. Programs are available at local hospitals and health centers. Call your local health department for   information about programs in your area.  Spiritual beliefs and practices may help some smokers quit.  Download a "quit meter" on your computer to keep track of quit statistics, such as how long you have gone without smoking, cigarettes not smoked, and money saved.  Get a self-help book about quitting smoking and staying off of tobacco. LEARN NEW SKILLS AND BEHAVIORS  Distract yourself from urges to smoke. Talk to someone, go for a walk, or occupy your time with a task.  Change your normal routine. Take a different route to work. Drink tea instead of coffee.  Eat breakfast in a different place.  Reduce your stress. Take a hot bath, exercise, or read a book.  Plan something enjoyable to do every day. Reward yourself for not smoking.  Explore interactive web-based programs that specialize in helping you quit. GET MEDICINE AND USE IT CORRECTLY Medicines can help you stop smoking and decrease the urge to smoke. Combining medicine with the above behavioral methods and support can greatly increase your chances of successfully quitting smoking.  Nicotine replacement therapy helps deliver nicotine to your body without the negative effects and risks of smoking. Nicotine replacement therapy includes nicotine gum, lozenges, inhalers, nasal sprays, and skin patches. Some may be available over-the-counter and others require a prescription.  Antidepressant medicine helps people abstain from smoking, but how this works is unknown. This medicine is available by prescription.  Nicotinic receptor partial agonist medicine simulates the effect of nicotine in your brain. This medicine is available by prescription. Ask your caregiver for advice about which medicines to use and how to use them based on your health history. Your caregiver will tell you what side effects to look out for if you choose to be on a medicine or therapy. Carefully read the information on the package. Do not use any other product containing nicotine while using a nicotine replacement product.  RELAPSE OR DIFFICULT SITUATIONS Most relapses occur within the first 3 months after quitting. Do not be discouraged if you start smoking again. Remember, most people try several times before finally quitting. You may have symptoms of withdrawal because your body is used to nicotine. You may crave cigarettes, be irritable, feel very hungry, cough often, get headaches, or have difficulty concentrating. The withdrawal symptoms are only temporary. They are strongest when you first quit, but they will go away within  10 14 days. To reduce the chances of relapse, try to:  Avoid drinking alcohol. Drinking lowers your chances of successfully quitting.  Reduce the amount of caffeine you consume. Once you quit smoking, the amount of caffeine in your body increases and can give you symptoms, such as a rapid heartbeat, sweating, and anxiety.  Avoid smokers because they can make you want to smoke.  Do not let weight gain distract you. Many smokers will gain weight when they quit, usually less than 10 pounds. Eat a healthy diet and stay active. You can always lose the weight gained after you quit.  Find ways to improve your mood other than smoking. FOR MORE INFORMATION  www.smokefree.gov  Document Released: 09/19/2001 Document Revised: 03/26/2012 Document Reviewed: 01/04/2012 [employer] Patient Information 2014 [employer].  

## 2013-03-19 NOTE — Progress Notes (Signed)
  Subjective:    Patient ID: Dustin Cruz, male    DOB: 04-Feb-1966, 47 y.o.   MRN: [id number]  HPI This 47 y.o. male presents for evaluation of DM, COPD, Peripheral neuropathy, and hyperlipidemia. He has hx of CAD and coronary artery stent.  He is seen in [place] by Cardiology.  He c/o lower extremity discomfort in his LE's at night.  He has noticed some arthralgias when it is cold and wants to know if he can have pain medicine or have his neurontin adjusted.    Review of Systems No chest pain, SOB, HA, dizziness, vision change, N/V, diarrhea, constipation, dysuria, urinary urgency or frequency, myalgias, arthralgias or rash.       Objective:   Physical Exam Vital signs noted  Well developed well nourished male.  HEENT - Head atraumatic Normocephalic                Eyes - PERRLA, Conjuctiva - clear Sclera- Clear EOMI                Ears - EAC's Wnl TM's Wnl Gross Hearing WNL                Nose - Nares patent                 Throat - oropharanx wnl Respiratory - Lungs CTA bilateral Cardiac - RRR S1 and S2 without murmur GI - Abdomen soft Nontender and bowel sounds active x 4 Extremities - No edema. Neuro - Grossly intact.        Assessment & Plan:  Diabetes - Plan: POCT CBC, POCT glycosylated hemoglobin (Hb A1C), metFORMIN (GLUCOPHAGE) 1000 MG tablet, insulin glargine (LANTUS) 100 UNIT/ML injection  Other and unspecified hyperlipidemia - Plan: COMPLETE METABOLIC PANEL WITH GFR, NMR Lipoprofile with Lipids  Unspecified hereditary and idiopathic peripheral neuropathy  Tobacco abuse  History of coronary artery stent placement  Chronic obstructive asthma, unspecified

## 2013-03-20 ENCOUNTER — Other Ambulatory Visit: Payer: Self-pay | Admitting: Family Medicine

## 2013-03-20 LAB — NMR LIPOPROFILE WITH LIPIDS
Cholesterol, Total: 277 mg/dL — ABNORMAL HIGH (ref <200)
HDL Particle Number: 32.7 umol/L (ref >=30.5)
HDL Size: 8.4 nm — ABNORMAL LOW (ref >=9.2)
HDL-C: 44 mg/dL (ref >=40)
LDL (calc): 175 mg/dL — ABNORMAL HIGH (ref <100)
LDL Particle Number: 2749 nmol/L — ABNORMAL HIGH (ref <1000)
LDL Size: 20.1 nm — ABNORMAL LOW (ref >20.5)
LP-IR Score: 77 — ABNORMAL HIGH (ref <=45)
Large HDL-P: <1.3 umol/L — ABNORMAL LOW (ref >=4.8)
Large VLDL-P: 7.7 nmol/L — ABNORMAL HIGH (ref <=2.7)
Small LDL Particle Number: 1869 nmol/L — ABNORMAL HIGH (ref <=527)
Triglycerides: 292 mg/dL — ABNORMAL HIGH (ref <150)
VLDL Size: 47.7 nm — ABNORMAL HIGH (ref <=46.6)

## 2013-03-20 MED ORDER — ATORVASTATIN CALCIUM 80 MG PO TABS: 80.0000 mg | ORAL_TABLET | Freq: Every day | ORAL | Status: DC

## 2013-03-24 ENCOUNTER — Telehealth: Payer: Self-pay | Admitting: Family Medicine

## 2013-03-24 DIAGNOSIS — E119 Type 2 diabetes mellitus without complications: Secondary | ICD-10-CM

## 2013-03-25 ENCOUNTER — Encounter: Payer: Self-pay | Admitting: Gastroenterology

## 2013-03-25 MED ORDER — GABAPENTIN 400 MG PO CAPS: ORAL_CAPSULE | ORAL | Status: DC

## 2013-03-25 MED ORDER — INSULIN GLARGINE 100 UNIT/ML ~~LOC~~ SOLN: 25.0000 [IU] | Freq: Every day | SUBCUTANEOUS | Status: DC

## 2013-03-25 MED ORDER — METFORMIN HCL 1000 MG PO TABS: 1000.0000 mg | ORAL_TABLET | Freq: Two times a day (BID) | ORAL | Status: DC

## 2013-03-25 NOTE — Telephone Encounter (Signed)
done

## 2013-04-03 ENCOUNTER — Other Ambulatory Visit: Payer: Self-pay

## 2013-04-03 DIAGNOSIS — E119 Type 2 diabetes mellitus without complications: Secondary | ICD-10-CM

## 2013-04-03 MED ORDER — INSULIN GLARGINE 100 UNIT/ML ~~LOC~~ SOLN: 25.0000 [IU] | Freq: Every day | SUBCUTANEOUS | Status: DC

## 2013-04-07 ENCOUNTER — Other Ambulatory Visit: Payer: Self-pay

## 2013-04-07 ENCOUNTER — Telehealth: Payer: Self-pay

## 2013-04-07 DIAGNOSIS — E119 Type 2 diabetes mellitus without complications: Secondary | ICD-10-CM

## 2013-04-07 NOTE — Telephone Encounter (Signed)
Since patient is confused please have him follow up so we can go over his insulin regimen.

## 2013-04-07 NOTE — Telephone Encounter (Signed)
Confused about dose from last visit of 03/19/13  Faxed from pharmacy as 54 units qhs  Looks like 25 units daily was discontinued on 03/19/13  Please fill as dose should be

## 2013-04-07 NOTE — Telephone Encounter (Signed)
Message    Tobi Bastos wrote: Dr. Jena Gauss (was assigned to Dr. Darrick Penna in June 2013 but has never seen her in the office, pt ended up having procedure with [facility]. Will remain with [facility] now).       So, I think you are correct in saying it's a Dr. Luvenia Starch patient. Please call the patient and reschedule with one of the extenders or [facility].      Thanks for catching that.      ----- Message -----   From: Trudee Kuster, LPN   Sent: 1/61/0960 7:45 AM   To: Veronia Beets, this pt is on Dr. Evelina Dun schedule for OV on 04/16/2013. There is a note in chart that says that he was assigned to Dr. Darrick Penna in June 2013 but never seen her in the office. Ended up having a procedure with RMR and will remain with RMR now. Please advise! ( Am I over looking something else?) Thanks!

## 2013-04-07 NOTE — Telephone Encounter (Signed)
Pt notified and appt made per L.O.

## 2013-04-07 NOTE — Telephone Encounter (Signed)
Pt is scheduled a follow up 6 month with AS on 04/24/2013 at 10:30 AM. * Cancelled the one on 04/16/2013 with SF since this is a [facility]MR pt.

## 2013-04-10 ENCOUNTER — Encounter: Payer: Self-pay | Admitting: Family Medicine

## 2013-04-10 ENCOUNTER — Ambulatory Visit (INDEPENDENT_AMBULATORY_CARE_PROVIDER_SITE_OTHER): Payer: Medicare Other | Admitting: Family Medicine

## 2013-04-10 VITALS — BP 118/72 | HR 105 | Temp 98.6°F | Ht 74.0 in | Wt 213.4 lb

## 2013-04-10 DIAGNOSIS — E119 Type 2 diabetes mellitus without complications: Secondary | ICD-10-CM

## 2013-04-10 DIAGNOSIS — M549 Dorsalgia, unspecified: Secondary | ICD-10-CM

## 2013-04-10 LAB — POCT UA - MICROSCOPIC ONLY
Bacteria, U Microscopic: NEGATIVE
Crystals, Ur, HPF, POC: NEGATIVE
Epithelial cells, urine per micros: NEGATIVE
Yeast, UA: NEGATIVE

## 2013-04-10 LAB — POCT URINALYSIS DIPSTICK
Bilirubin, UA: NEGATIVE
Blood, UA: NEGATIVE
Glucose, UA: 1000
Leukocytes, UA: NEGATIVE
Nitrite, UA: NEGATIVE
Spec Grav, UA: 1.02
Urobilinogen, UA: NEGATIVE
pH, UA: 5

## 2013-04-10 MED ORDER — METFORMIN HCL 1000 MG PO TABS: 1000.0000 mg | ORAL_TABLET | Freq: Two times a day (BID) | ORAL | Status: DC

## 2013-04-10 MED ORDER — ALBUTEROL SULFATE (2.5 MG/3ML) 0.083% IN NEBU: 2.5000 mg | INHALATION_SOLUTION | Freq: Two times a day (BID) | RESPIRATORY_TRACT | Status: DC | PRN

## 2013-04-10 NOTE — Progress Notes (Signed)
  Subjective:    Patient ID: Dustin Cruz, male    DOB: 1966-05-07, 47 y.o.   MRN: [id number]  HPI This 47 y.o. male presents for evaluation of diabetes.  He is using lantus 40 units sq qhs and reports that his fsbs is running in the 200's.  He was not able to get a refill on his lantus insulin and is confused how to take his insulin.  Recent labs show he has normal hgbaic.  He has been having some foul smelling urine and some back pain.   Review of Systems No chest pain, SOB, HA, dizziness, vision change, N/V, diarrhea, constipation, dysuria, urinary urgency or frequency, myalgias, arthralgias or rash.     Objective:   Physical Exam Vital signs noted  Well developed well nourished male.  HEENT - Head atraumatic Normocephalic                Eyes - PERRLA, Conjuctiva - clear Sclera- Clear EOMI                Ears - EAC's Wnl TM's Wnl Gross Hearing WNL                Nose - Nares patent                 Throat - oropharanx wnl Respiratory - Lungs CTA bilateral Cardiac - RRR S1 and S2 without murmur GI - Abdomen soft Nontender and bowel sounds active x 4 Extremities - No edema. Neuro - Grossly intact.       Assessment & Plan:  Diabetes - Plan: metFORMIN (GLUCOPHAGE) 1000 MG tablet, Called in his lantus insulin to [facility] pharmacy and discussed with patient who repeats instructions back. Lantus insulin 42 units sq qhs and increase one unit daily until fsbs fasting is less than 140.  Back pain - Plan: POCT urinalysis dipstick, POCT UA - Microscopic Only

## 2013-04-10 NOTE — Patient Instructions (Addendum)

## 2013-04-14 ENCOUNTER — Ambulatory Visit (INDEPENDENT_AMBULATORY_CARE_PROVIDER_SITE_OTHER): Payer: Medicare Other | Admitting: Family Medicine

## 2013-04-14 ENCOUNTER — Emergency Department (HOSPITAL_COMMUNITY): Payer: PRIVATE HEALTH INSURANCE

## 2013-04-14 ENCOUNTER — Telehealth: Payer: Self-pay | Admitting: Family Medicine

## 2013-04-14 ENCOUNTER — Emergency Department (HOSPITAL_COMMUNITY)
Admission: EM | Admit: 2013-04-14 | Discharge: 2013-04-14 | Disposition: A | Discharge status: Home / Self Care | Payer: PRIVATE HEALTH INSURANCE | Attending: Emergency Medicine | Admitting: Emergency Medicine

## 2013-04-14 ENCOUNTER — Encounter (HOSPITAL_COMMUNITY): Payer: Self-pay

## 2013-04-14 ENCOUNTER — Encounter: Payer: Self-pay | Admitting: Family Medicine

## 2013-04-14 VITALS — BP 126/75 | HR 107 | Temp 100.4°F | Wt 217.2 lb

## 2013-04-14 DIAGNOSIS — E119 Type 2 diabetes mellitus without complications: Secondary | ICD-10-CM

## 2013-04-14 DIAGNOSIS — J45909 Unspecified asthma, uncomplicated: Secondary | ICD-10-CM | POA: Insufficient documentation

## 2013-04-14 DIAGNOSIS — Z720 Tobacco use: Secondary | ICD-10-CM

## 2013-04-14 DIAGNOSIS — Z794 Long term (current) use of insulin: Secondary | ICD-10-CM | POA: Insufficient documentation

## 2013-04-14 DIAGNOSIS — G629 Polyneuropathy, unspecified: Secondary | ICD-10-CM

## 2013-04-14 DIAGNOSIS — F17200 Nicotine dependence, unspecified, uncomplicated: Secondary | ICD-10-CM

## 2013-04-14 DIAGNOSIS — I7090 Unspecified atherosclerosis: Secondary | ICD-10-CM

## 2013-04-14 DIAGNOSIS — F411 Generalized anxiety disorder: Secondary | ICD-10-CM | POA: Insufficient documentation

## 2013-04-14 DIAGNOSIS — M545 Low back pain, unspecified: Secondary | ICD-10-CM | POA: Insufficient documentation

## 2013-04-14 DIAGNOSIS — I252 Old myocardial infarction: Secondary | ICD-10-CM | POA: Insufficient documentation

## 2013-04-14 DIAGNOSIS — E785 Hyperlipidemia, unspecified: Secondary | ICD-10-CM

## 2013-04-14 DIAGNOSIS — R350 Frequency of micturition: Secondary | ICD-10-CM

## 2013-04-14 DIAGNOSIS — I2510 Atherosclerotic heart disease of native coronary artery without angina pectoris: Secondary | ICD-10-CM

## 2013-04-14 DIAGNOSIS — Z85820 Personal history of malignant melanoma of skin: Secondary | ICD-10-CM | POA: Insufficient documentation

## 2013-04-14 DIAGNOSIS — R109 Unspecified abdominal pain: Secondary | ICD-10-CM

## 2013-04-14 DIAGNOSIS — G609 Hereditary and idiopathic neuropathy, unspecified: Secondary | ICD-10-CM

## 2013-04-14 DIAGNOSIS — Z79899 Other long term (current) drug therapy: Secondary | ICD-10-CM | POA: Insufficient documentation

## 2013-04-14 DIAGNOSIS — I10 Essential (primary) hypertension: Secondary | ICD-10-CM

## 2013-04-14 DIAGNOSIS — R319 Hematuria, unspecified: Secondary | ICD-10-CM | POA: Insufficient documentation

## 2013-04-14 DIAGNOSIS — K219 Gastro-esophageal reflux disease without esophagitis: Secondary | ICD-10-CM | POA: Insufficient documentation

## 2013-04-14 DIAGNOSIS — F329 Major depressive disorder, single episode, unspecified: Secondary | ICD-10-CM | POA: Insufficient documentation

## 2013-04-14 DIAGNOSIS — R509 Fever, unspecified: Secondary | ICD-10-CM

## 2013-04-14 LAB — CBC WITH DIFFERENTIAL/PLATELET
Basophils Absolute: 0 10*3/uL (ref 0.0–0.1)
Basophils Relative: 1 % (ref 0–1)
Lymphocytes Relative: 29 % (ref 12–46)
MCHC: 35.5 g/dL (ref 30.0–36.0)
Neutro Abs: 5.4 10*3/uL (ref 1.7–7.7)
Neutrophils Relative %: 61 % (ref 43–77)
RDW: 12.5 % (ref 11.5–15.5)
WBC: 8.8 10*3/uL (ref 4.0–10.5)

## 2013-04-14 LAB — POCT UA - MICROSCOPIC ONLY
Bacteria, U Microscopic: NEGATIVE
Casts, Ur, LPF, POC: NEGATIVE
Crystals, Ur, HPF, POC: NEGATIVE
Mucus, UA: NEGATIVE
RBC, urine, microscopic: NEGATIVE
WBC, Ur, HPF, POC: NEGATIVE
Yeast, UA: NEGATIVE

## 2013-04-14 LAB — URINALYSIS, ROUTINE W REFLEX MICROSCOPIC
Glucose, UA: >1000 mg/dL — AB
Ketones, ur: NEGATIVE mg/dL
Leukocytes, UA: NEGATIVE
Nitrite: NEGATIVE
Protein, ur: NEGATIVE mg/dL
pH: 6 (ref 5.0–8.0)

## 2013-04-14 LAB — POCT URINALYSIS DIPSTICK
Bilirubin, UA: NEGATIVE
Blood, UA: NEGATIVE
Glucose, UA: 250
Ketones, UA: NEGATIVE
Leukocytes, UA: NEGATIVE
Nitrite, UA: NEGATIVE
Protein, UA: NEGATIVE
Spec Grav, UA: 1.01
Urobilinogen, UA: NEGATIVE
pH, UA: 5

## 2013-04-14 LAB — BASIC METABOLIC PANEL
CO2: 28 mEq/L (ref 19–32)
Chloride: 95 mEq/L — ABNORMAL LOW (ref 96–112)
GFR calc Af Amer: >90 mL/min (ref >90)
Potassium: 4.6 mEq/L (ref 3.5–5.1)
Sodium: 132 mEq/L — ABNORMAL LOW (ref 135–145)

## 2013-04-14 LAB — URINE MICROSCOPIC-ADD ON

## 2013-04-14 MED ORDER — HYDROMORPHONE HCL PF 1 MG/ML IJ SOLN
1.0000 mg | Freq: Once | INTRAMUSCULAR | Status: AC
  Administered 2013-04-14: 1 mg via INTRAVENOUS
  Filled 2013-04-14: qty 1

## 2013-04-14 MED ORDER — ONDANSETRON HCL 4 MG/2ML IJ SOLN
4.0000 mg | Freq: Once | INTRAMUSCULAR | Status: AC
  Administered 2013-04-14: 4 mg via INTRAVENOUS
  Filled 2013-04-14: qty 2

## 2013-04-14 MED ORDER — TRAMADOL HCL 50 MG PO TABS: 50.0000 mg | ORAL_TABLET | Freq: Four times a day (QID) | ORAL | Status: DC | PRN

## 2013-04-14 MED ORDER — SODIUM CHLORIDE 0.9 % IV BOLUS (SEPSIS)
1000.0000 mL | Freq: Once | INTRAVENOUS | Status: AC
  Administered 2013-04-14: 1000 mL via INTRAVENOUS

## 2013-04-14 NOTE — Progress Notes (Signed)
Patient ID: Dustin Cruz, male   DOB: 02-03-1966, 47 y.o.   MRN: [id number] SUBJECTIVE: CC: Chief Complaint  Patient presents with  . Acute Visit    lower abd pain last bm this am -normal . thinks may have UTI states seen last week and told them had "smell  when urinated" saw bill oxford.  . Flank Pain    pain left kidney area low back pain . states urine  \very yellow    HPI: Left flank pain, feels  Swelled around the side. Sees some blood in urine. Fever low grade. Chills now. Urine looks more yellow. No dysuria. Was  Seen last week. See note in Epic. No history of kidney stones.  Past Medical History  Diagnosis Date  . Diabetes mellitus     Type 2  . Hyperlipidemia   . Depression   . Coronary artery disease     s/p BMS to Ramus 10/2010;  Cath 01/22/12 prox 30-40% LAD, LCx ramus w/ hazy 70-80% in-stent restenosis, EF 60% treated medically  . HTN (hypertension)   . Tobacco abuse   . Obesity   . Anxiety   . Melanoma 2007    surgery at [facility], Followed by Neijstrom  . Myocardial infarction 2012  . GERD (gastroesophageal reflux disease)   . Hyperlipidemia   . Asthma   . Coronary artery disease     s/p BMS to Ramus 10/2010;  Cath 01/22/12 prox 30-40% LAD, LCx ramus w/ hazy 70-80% in-stent restenosis, EF 60% treated medically   . Major depression, chronic 08/13/2012   Past Surgical History  Procedure Laterality Date  . Melanoma surgery  2007    [facility], removed lymph nodes under arm as well,Left abd  . Cardiac catheterization      with stent  . Esophagogastroduodenoscopy  04/24/2012    Rourk-mild erosive reflux esophagitis,dilated w/68F Elease Hashimoto, small HH, minimal chronic gastric/bulbar erosions(No H pylori)  . Colonoscopy N/A 01/27/2013    Procedure: COLONOSCOPY;  Surgeon: Corbin Ade, MD;  Location: [facility];  Service: Endoscopy;  Laterality: N/A;  2:00-moved to [address] to notify pt   History   Social History  . Marital Status: Married    Spouse Name: rayann Gaffin     Number of Children: 0  . Years of Education: N/A   Occupational History  . disabled    Social History Main Topics  . Smoking status: Current Every Day Smoker -- 0.50 packs/day for 30 years    Types: Cigarettes    Start date: 10/09/1981  . Smokeless tobacco: Never Used  . 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 Drug Use: No  . Sexually Active: Yes   Other Topics Concern  . Not on file   Social History Narrative   Lives w/ wife   Family History  Problem Relation Age of Onset  . Lung cancer    . Heart disease    . Other      not real familiar with family history  . Colon cancer Neg Hx   . Colon polyps Neg Hx    Current Outpatient Prescriptions on File Prior to Visit  Medication Sig Dispense Refill  . albuterol (PROVENTIL HFA;VENTOLIN HFA) 108 (90 BASE) MCG/ACT inhaler Inhale 2 puffs into the lungs every 6 (six) hours as needed for wheezing.  1 Inhaler  2  . albuterol (PROVENTIL) (2.5 MG/3ML) 0.083% nebulizer solution Take 3 mLs (2.5 mg total) by nebulization 2 (two)  times daily as needed for wheezing or shortness of breath.  75 mL  3  . aspirin EC 81 MG EC tablet Take 1 tablet (81 mg total) by mouth daily.      [name] atorvastatin (LIPITOR) 80 MG tablet Take 1 tablet (80 mg total) by mouth daily.  90 tablet  3  . gabapentin (NEURONTIN) 400 MG capsule One po qid  360 capsule  3  . HYDROcodone-acetaminophen (NORCO/VICODIN) 5-325 MG per tablet Take 1-2 tablets by mouth every 4 (four) hours as needed.  15 tablet  0  . insulin glargine (LANTUS) 100 UNIT/ML injection Inject 0.25 mLs (25 Units total) into the skin at bedtime.  10 mL  3  . lisinopril (PRINIVIL,ZESTRIL) 20 MG tablet Take 1 tablet (20 mg total) by mouth daily.  90 tablet  3  . metFORMIN (GLUCOPHAGE) 1000 MG tablet Take 1 tablet (1,000 mg total) by mouth 2 (two) times daily with a meal. HOLD for 48 hours, restart on April 18th.  60 tablet  3  . metoprolol succinate (TOPROL-XL) 25 MG 24 hr  tablet Take 1 tablet (25 mg total) by mouth daily.  30 tablet  6  . mometasone-formoterol (DULERA) 100-5 MCG/ACT AERO Inhale 2 puffs into the lungs 2 (two) times daily.  1 Inhaler  0  . nitroGLYCERIN (NITROSTAT) 0.4 MG SL tablet Place 1 tablet (0.4 mg total) under the tongue every 5 (five) minutes as needed. For chest pain.  25 tablet  3  . omeprazole (PRILOSEC) 40 MG capsule Take 1 capsule (40 mg total) by mouth 2 (two) times daily.  60 capsule  0  . ondansetron (ZOFRAN ODT) 4 MG disintegrating tablet Take 1 tablet (4 mg total) by mouth every 8 (eight) hours as needed for nausea.  10 tablet  0  . polyethylene glycol-electrolytes (TRILYTE) 420 G solution Take 4,000 mLs by mouth as directed.  4000 mL  0  . sertraline (ZOLOFT) 50 MG tablet Take 1 tablet (50 mg total) by mouth daily.  30 tablet  0  . traZODone (DESYREL) 50 MG tablet Take 50 mg by mouth at bedtime.      . [DISCONTINUED] metoprolol tartrate (LOPRESSOR) 25 MG tablet Take 25 mg by mouth 2 (two) times daily.         No current facility-administered medications on file prior to visit.   No Known Allergies Immunization History  Administered Date(s) Administered  . Influenza Split 07/05/2012  . Pneumococcal Polysaccharide 07/05/2012   Prior to Admission medications   Medication Sig Start Date End Date Taking? Authorizing Provider  albuterol (PROVENTIL HFA;VENTOLIN HFA) 108 (90 BASE) MCG/ACT inhaler Inhale 2 puffs into the lungs every 6 (six) hours as needed for wheezing. 08/13/12   Norval Gable, NP  albuterol (PROVENTIL) (2.5 MG/3ML) 0.083% nebulizer solution Take 3 mLs (2.5 mg total) by nebulization 2 (two) times daily as needed for wheezing or shortness of breath. 04/10/13   Deatra Canter, FNP  aspirin EC 81 MG EC tablet Take 1 tablet (81 mg total) by mouth daily. 11/26/12   Gwenyth Bender, NP  atorvastatin (LIPITOR) 80 MG tablet Take 1 tablet (80 mg total) by mouth daily. 03/20/13   Deatra Canter, FNP  gabapentin (NEURONTIN) 400  MG capsule One po qid 03/25/13   Deatra Canter, FNP  HYDROcodone-acetaminophen (NORCO/VICODIN) 5-325 MG per tablet Take 1-2 tablets by mouth every 4 (four) hours as needed. 11/26/12   Gwenyth Bender, NP  insulin glargine (LANTUS) 100 UNIT/ML injection Inject  0.25 mLs (25 Units total) into the skin at bedtime. 04/03/13   Deatra Canter, FNP  lisinopril (PRINIVIL,ZESTRIL) 20 MG tablet Take 1 tablet (20 mg total) by mouth daily. 03/12/13   Kathlen Brunswick, MD  metFORMIN (GLUCOPHAGE) 1000 MG tablet Take 1 tablet (1,000 mg total) by mouth 2 (two) times daily with a meal. HOLD for 48 hours, restart on April 18th. 04/10/13   Deatra Canter, FNP  metoprolol succinate (TOPROL-XL) 25 MG 24 hr tablet Take 1 tablet (25 mg total) by mouth daily. 09/13/12   Jodelle Gross, NP  mometasone-formoterol (DULERA) 100-5 MCG/ACT AERO Inhale 2 puffs into the lungs 2 (two) times daily. 08/13/12   Norval Gable, NP  nitroGLYCERIN (NITROSTAT) 0.4 MG SL tablet Place 1 tablet (0.4 mg total) under the tongue every 5 (five) minutes as needed. For chest pain. 08/13/12   Norval Gable, NP  omeprazole (PRILOSEC) 40 MG capsule Take 1 capsule (40 mg total) by mouth 2 (two) times daily. 11/26/12   Gwenyth Bender, NP  ondansetron (ZOFRAN ODT) 4 MG disintegrating tablet Take 1 tablet (4 mg total) by mouth every 8 (eight) hours as needed for nausea. 11/23/12   Vida Roller, MD  polyethylene glycol-electrolytes (TRILYTE) 420 G solution Take 4,000 mLs by mouth as directed. 01/23/13   Corbin Ade, MD  sertraline (ZOLOFT) 50 MG tablet Take 1 tablet (50 mg total) by mouth daily. 08/13/12   Norval Gable, NP  traZODone (DESYREL) 50 MG tablet Take 50 mg by mouth at bedtime.    Historical Provider, MD     ROS: As above in the HPI. All other systems are stable or negative.  OBJECTIVE: APPEARANCE:  Patient in acute distress.The patient appeared well nourished/obese and normally developed. Acyanotic. Waist: VITAL SIGNS:BP 126/75   Pulse 107  Temp(Src) 100.4 F (38 C) (Oral)  Wt 217 lb 3.2 oz (98.521 kg)  BMI 27.87 kg/m2 WM in discomfort pacing  SKIN: warm and  Dry without overt rashes, tattoos and scars  HEAD and Neck: without JVD, Head and scalp: normal Eyes:No scleral icterus. Fundi normal, eye movements normal. Ears: Auricle normal, canal normal, Tympanic membranes normal, insufflation normal. Nose: normal Throat: normal Neck & thyroid: normal  CHEST & LUNGS: Chest wall: normal Lungs: Clear  CVS: Reveals the PMI to be normally located. Regular rhythm, First and Second Heart sounds are normal,  absence of murmurs, rubs or gallops. Peripheral vasculature: Radial pulses: normal Dorsal pedis pulses: normal Posterior pulses: normal  ABDOMEN:  Appearance: distended Tender left flank and left CVA and LLQ, no organomegaly, no masses, no Abdominal Aortic enlargement palpable voluntary Guarding , no rebound. No Bruits. Bowel sounds: reduced  RECTAL: brown stools heme negative.no masses felt GU: N/A  EXTREMETIES: nonedematous.  NEUROLOGIC: oriented to time,place and person; nonfocal.   ASSESSMENT: Abdominal pain, unspecified site  Fever, unspecified  Frequency of urination - Plan: POCT urinalysis dipstick, POCT UA - Microscopic Only  Tobacco abuse  Peripheral neuropathy  Hypertension  Hyperlipidemia  Diabetes mellitus, type 2  Arteriosclerotic cardiovascular disease (ASCVD)   PLAN: With his  Severity of pain, fever and comorbid conditions he is best evaluated in the ED setting this afternoon. Will refer to the ED at [facility]. Charge nurse informed. Acess to note in EPIC when I complete in a few minutes. Patient stable to travel by car with his wife driving.  Await evaluation and treatment in the ED setting.  Sherrol Vicars P. Modesto Charon, M.D.

## 2013-04-14 NOTE — ED Notes (Signed)
Pt reports flank pain that started yesterday, thinks he had blood in his urine, denies any nausea, vomiting or diarrhea, no fever.

## 2013-04-14 NOTE — Telephone Encounter (Signed)
Appt given for today 

## 2013-04-14 NOTE — ED Provider Notes (Signed)
History    This chart was scribed for Dustin Lennert, MD, by Dustin Cruz, ED scribe. The patient was seen in room APA11/[facility]11 and the patient's care was started at 1811.   CSN: [id number] Arrival date & time 04/14/13  1710  First MD Initiated Contact with Patient 04/14/13 1811     Chief Complaint  Patient presents with  . Flank Pain   (Consider location/radiation/quality/duration/timing/severity/associated sxs/prior Treatment) Patient is a 47 y.o. male presenting with flank pain. The history is provided by the patient and medical records. No language interpreter was used.  Flank Pain This is a new problem. The current episode started 6 to 12 hours ago. The problem occurs constantly. The problem has been gradually worsening. Pertinent negatives include no chest pain, no abdominal pain and no headaches. Exacerbated by: sitting up. Nothing relieves the symptoms. He has tried nothing for the symptoms.    HPI Comments: Dustin Cruz is a 47 y.o. male with a h/o of DM who presents to the Emergency Department complaining of constant, worsening lower back pain that began 1 week ago with associated gradually worsening bilateral flank pain that is aggravated by sitting up that began today. He reports he saw Dr. Modesto Charon 1 week ago for the back pain and received a call from his office today telling him his UA was positive for hematuria. He reports he went back to his PCP today after the flank pain began, and he was referred to the ED. He reports he has been voiding regularly. Denies n/v/d and fever.   PCP is Dr. Modesto Charon.    Past Medical History  Diagnosis Date  . Diabetes mellitus     Type 2  . Hyperlipidemia   . Depression   . Coronary artery disease     s/p BMS to Ramus 10/2010;  Cath 01/22/12 prox 30-40% LAD, LCx ramus w/ hazy 70-80% in-stent restenosis, EF 60% treated medically  . HTN (hypertension)   . Tobacco abuse   . Obesity   . Anxiety   . Melanoma 2007    surgery at [facility], Followed by  Neijstrom  . Myocardial infarction 2012  . GERD (gastroesophageal reflux disease)   . Hyperlipidemia   . Asthma   . Coronary artery disease     s/p BMS to Ramus 10/2010;  Cath 01/22/12 prox 30-40% LAD, LCx ramus w/ hazy 70-80% in-stent restenosis, EF 60% treated medically   . Major depression, chronic 08/13/2012   Past Surgical History  Procedure Laterality Date  . Melanoma surgery  2007    [facility], removed lymph nodes under arm as well,Left abd  . Cardiac catheterization      with stent  . Esophagogastroduodenoscopy  04/24/2012    Rourk-mild erosive reflux esophagitis,dilated w/13F Elease Hashimoto, small HH, minimal chronic gastric/bulbar erosions(No H pylori)  . Colonoscopy N/A 01/27/2013    Procedure: COLONOSCOPY;  Surgeon: Corbin Ade, MD;  Location: [facility];  Service: Endoscopy;  Laterality: N/A;  2:00-moved to [address] to notify pt   Family History  Problem Relation Age of Onset  . Lung cancer    . Heart disease    . Other      not real familiar with family history  . Colon cancer Neg Hx   . Colon polyps Neg Hx    History  Substance Use Topics  . Smoking status: Current Every Day Smoker -- 0.50 packs/day for 30 years    Types: Cigarettes    Start date: 10/09/1981  .  Smokeless tobacco: Never Used  . 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  HENT: Negative for congestion, sinus pressure and ear discharge.   Eyes: Negative for discharge.  Respiratory: Negative for cough.   Cardiovascular: Negative for chest pain.  Gastrointestinal: Negative for nausea, vomiting, abdominal pain and diarrhea.  Genitourinary: Positive for hematuria and flank pain. Negative for frequency.  Musculoskeletal: Positive for back pain.  Skin: Negative for rash.  Neurological: Negative for seizures and headaches.  Psychiatric/Behavioral: Negative for hallucinations.     Allergies  Review of patient's allergies indicates no known allergies.  Home Medications   Current Outpatient Rx  Name  Route  Sig  Dispense  Refill  . albuterol (PROVENTIL HFA;VENTOLIN HFA) 108 (90 BASE) MCG/ACT inhaler   Inhalation   Inhale 2 puffs into the lungs every 6 (six) hours as needed for wheezing.   1 Inhaler   2   . albuterol (PROVENTIL) (2.5 MG/3ML) 0.083% nebulizer solution   Nebulization   Take 3 mLs (2.5 mg total) by nebulization 2 (two) times daily as needed for wheezing or shortness of breath.   75 mL   3   . atorvastatin (LIPITOR) 80 MG tablet   Oral   Take 1 tablet (80 mg total) by mouth daily.   90 tablet   3   . gabapentin (NEURONTIN) 400 MG capsule   Oral   Take 400 mg by mouth 4 (four) times daily.         . Insulin Glargine (LANTUS SOLOSTAR) 100 UNIT/ML SOPN   Subcutaneous   Inject 42 Units into the skin at bedtime.         [name] lisinopril (PRINIVIL,ZESTRIL) 20 MG tablet   Oral   Take 1 tablet (20 mg total) by mouth daily.   90 tablet   3   . metoprolol succinate (TOPROL-XL) 25 MG 24 hr tablet   Oral   Take 1 tablet (25 mg total) by mouth daily.   30 tablet   6   . mometasone-formoterol (DULERA) 100-5 MCG/ACT AERO   Inhalation   Inhale 2 puffs into the lungs 2 (two) times daily.   1 Inhaler   0   . omeprazole (PRILOSEC) 40 MG capsule   Oral   Take 1 capsule (40 mg total) by mouth 2 (two) times daily.   60 capsule   0   . sertraline (ZOLOFT) 50 MG tablet   Oral   Take 1 tablet (50 mg total) by mouth daily.   30 tablet   0   . traZODone (DESYREL) 50 MG tablet   Oral   Take 50 mg by mouth at bedtime.         . nitroGLYCERIN (NITROSTAT) 0.4 MG SL tablet   Sublingual   Place 1 tablet (0.4 mg total) under the tongue every 5 (five) minutes as needed. For chest pain.   25 tablet   3    BP 127/70  Pulse 98  Temp(Src) 97.8 F (36.6 C) (Oral)  Resp 18  Wt 217 lb (98.431 kg)  BMI 27.85 kg/m2  SpO2 98% Physical  Exam  Nursing note and vitals reviewed. Constitutional: He is oriented to person, place, and time. He appears well-developed.  HENT:  Head: Normocephalic.  Eyes: Conjunctivae and EOM are normal. No scleral icterus.  Neck: Neck supple. No thyromegaly present.  Cardiovascular: Normal rate and regular  rhythm.  Exam reveals no gallop and no friction rub.   No murmur heard. Pulmonary/Chest: No stridor. He has no wheezes. He has no rales. He exhibits no tenderness.  Abdominal: He exhibits no distension. There is tenderness. There is no rebound.  Moderate diffuse abdominal tenderness.  Musculoskeletal: Normal range of motion. He exhibits tenderness. He exhibits no edema.  Moderate flank tenderness.   Lymphadenopathy:    He has no cervical adenopathy.  Neurological: He is oriented to person, place, and time. Coordination normal.  Skin: No rash noted. No erythema.  Psychiatric: He has a normal mood and affect. His behavior is normal.    ED Course  Procedures (including critical care time)  DIAGNOSTIC STUDIES: Oxygen Saturation is 98% on room air, normal by my interpretation.    COORDINATION OF CARE:  18:20- Discussed planned course of treatment with the patient, including a UA with reflex microscopic, who is agreeable at this time.  18:30- Medication Orders- hydromorphone (dilaudid) injection 1 mg- once, ondansetron (zofran) injection 4 mg- once, sodium chloride 0.9% bolus 1,000 mL- once.  19:30- Medication Orders- hydromorphone (dilaudid) injection 1 mg- once.  20:50- Discussed imaging and blood work findings with the patient as well as following up with Dr. Modesto Charon and his GI, who is Dr. Jena Gauss. He is agreeable at this time.   Results for orders placed during the hospital encounter of 04/14/13  GLUCOSE, CAPILLARY      Result Value Range   Glucose-Capillary 439 (*) 70 - 99 mg/dL  URINALYSIS, ROUTINE W REFLEX MICROSCOPIC      Result Value Range   Color, Urine YELLOW  YELLOW    APPearance CLEAR  CLEAR   Specific Gravity, Urine 1.015  1.005 - 1.030   pH 6.0  5.0 - 8.0   Glucose, UA >1000 (*) NEGATIVE mg/dL   Hgb urine dipstick TRACE (*) NEGATIVE   Bilirubin Urine NEGATIVE  NEGATIVE   Ketones, ur NEGATIVE  NEGATIVE mg/dL   Protein, ur NEGATIVE  NEGATIVE mg/dL   Urobilinogen, UA 0.2  0.0 - 1.0 mg/dL   Nitrite NEGATIVE  NEGATIVE   Leukocytes, UA NEGATIVE  NEGATIVE  CBC WITH DIFFERENTIAL      Result Value Range   WBC 8.8  4.0 - 10.5 K/uL   RBC 4.89  4.22 - 5.81 MIL/uL   Hemoglobin 15.9  13.0 - 17.0 g/dL   HCT 45.4  09.8 - 11.9 %   MCV 91.6  78.0 - 100.0 fL   MCH 32.5  26.0 - 34.0 pg   MCHC 35.5  30.0 - 36.0 g/dL   RDW 14.7  82.9 - 56.2 %   Platelets 310  150 - 400 K/uL   Neutrophils Relative % 61  43 - 77 %   Neutro Abs 5.4  1.7 - 7.7 K/uL   Lymphocytes Relative 29  12 - 46 %   Lymphs Abs 2.6  0.7 - 4.0 K/uL   Monocytes Relative 8  3 - 12 %   Monocytes Absolute 0.7  0.1 - 1.0 K/uL   Eosinophils Relative 1  0 - 5 %   Eosinophils Absolute 0.1  0.0 - 0.7 K/uL   Basophils Relative 1  0 - 1 %   Basophils Absolute 0.0  0.0 - 0.1 K/uL  BASIC METABOLIC PANEL      Result Value Range   Sodium 132 (*) 135 - 145 mEq/L   Potassium 4.6  3.5 - 5.1 mEq/L   Chloride 95 (*) 96 - 112 mEq/L  CO2 28  19 - 32 mEq/L   Glucose, Bld 334 (*) 70 - 99 mg/dL   BUN 17  6 - 23 mg/dL   Creatinine, Ser 1.61  0.50 - 1.35 mg/dL   Calcium 09.6  8.4 - 04.5 mg/dL   GFR calc non Af Amer >90  >90 mL/min   GFR calc Af Amer >90  >90 mL/min  URINE MICROSCOPIC-ADD ON      Result Value Range   WBC, UA 0-2  <3 WBC/hpf   RBC / HPF 0-2  <3 RBC/hpf    Ct Abdomen Pelvis Wo Contrast  04/14/2013   *RADIOLOGY REPORT*  Clinical Data:  Left-sided flank pain for 1 week with hematuria. Diabetes.  Hypertension.  History of melanoma.  CT ABDOMEN AND PELVIS WITHOUT CONTRAST (CT UROGRAM)  Technique: Contiguous axial images of the abdomen and pelvis without oral or intravenous contrast were obtained.   Comparison: 12/30/2012  Findings:  Exam is limited for evaluation of entities other than urinary tract calculi due to lack of oral or intravenous contrast.   Lung bases:  Normal  Abdomen/pelvis:  Normal uninfused appearance of the liver, spleen, stomach, pancreas, gallbladder, biliary tract, adrenal glands.  No renal calculi or hydronephrosis.  No hydroureter or ureteric calculi.  Aortic atherosclerosis which is mildly age advanced. No retroperitoneal or retrocrural adenopathy.  Normal colon, appendix, and terminal ileum.  Normal abdominal small bowel without ascites. No pelvic adenopathy.    Normal urinary bladder and prostate.  No significant free fluid.  Bones/Musculoskeletal:  Congenitally short lumbar pedicles.  IMPRESSION:  1. No urinary tract calculi or hydronephrosis. 2.  No other explanation for left-sided pain/hematuria.   Original Report Authenticated By: Jeronimo Greaves, M.D.   No diagnosis found.  MDM   The chart was scribed for me under my direct supervision.  I personally performed the history, physical, and medical decision making and all procedures in the evaluation of this patient.Dustin Lennert, MD 04/14/13 2055

## 2013-04-16 ENCOUNTER — Ambulatory Visit: Payer: Self-pay | Admitting: Gastroenterology

## 2013-04-18 ENCOUNTER — Ambulatory Visit (INDEPENDENT_AMBULATORY_CARE_PROVIDER_SITE_OTHER): Payer: Medicare Other | Admitting: Family Medicine

## 2013-04-18 ENCOUNTER — Other Ambulatory Visit: Payer: Self-pay | Admitting: Family Medicine

## 2013-04-18 VITALS — BP 117/70 | HR 83 | Temp 97.1°F | Wt 215.4 lb

## 2013-04-18 DIAGNOSIS — K5732 Diverticulitis of large intestine without perforation or abscess without bleeding: Secondary | ICD-10-CM

## 2013-04-18 DIAGNOSIS — N23 Unspecified renal colic: Secondary | ICD-10-CM

## 2013-04-18 DIAGNOSIS — R309 Painful micturition, unspecified: Secondary | ICD-10-CM

## 2013-04-18 DIAGNOSIS — M25569 Pain in unspecified knee: Secondary | ICD-10-CM

## 2013-04-18 LAB — POCT URINALYSIS DIPSTICK
Bilirubin, UA: NEGATIVE
Glucose, UA: NEGATIVE
Ketones, UA: NEGATIVE
Leukocytes, UA: NEGATIVE
Nitrite, UA: NEGATIVE
Spec Grav, UA: 1.025
Urobilinogen, UA: NEGATIVE
pH, UA: 5

## 2013-04-18 LAB — POCT UA - MICROSCOPIC ONLY
Bacteria, U Microscopic: NEGATIVE
Casts, Ur, LPF, POC: NEGATIVE
Crystals, Ur, HPF, POC: NEGATIVE
Yeast, UA: NEGATIVE

## 2013-04-18 MED ORDER — CIPROFLOXACIN HCL 500 MG PO TABS: 500.0000 mg | ORAL_TABLET | Freq: Two times a day (BID) | ORAL | Status: DC

## 2013-04-18 MED ORDER — METRONIDAZOLE 500 MG PO TABS: 500.0000 mg | ORAL_TABLET | Freq: Three times a day (TID) | ORAL | Status: DC

## 2013-04-18 MED ORDER — HYDROCORTISONE 2.5 % RE CREA: TOPICAL_CREAM | Freq: Two times a day (BID) | RECTAL | Status: DC

## 2013-04-18 NOTE — Progress Notes (Signed)
  Subjective:    Patient ID: Dustin Cruz, male    DOB: 1966/04/14, 47 y.o.   MRN: [id number]  HPI This 47 y.o. male presents for evaluation of abdominal pain x one week  He was seen in the ED a few Days ago and he had negative CT scan and was given IV pain medication and po pain medication And told to follow up with his PCP. He states the pain is in his left lower abdomen.  He has also been Having bilateral leg pain when he walks and gets cramps in legs and also has been experiencing some Cold feet.  He states he likes to soak his legs in the bath tub.   Review of Systems C/o abdominal pain and pain in Lower extremities. No chest pain, SOB, HA, dizziness, vision change, N/V, diarrhea, constipation, dysuria, urinary urgency or frequency, myalgias, arthralgias or rash.     Objective:   Physical Exam Vital signs noted  Well developed well nourished male.  HEENT - Head atraumatic Normocephalic                Eyes - PERRLA, Conjuctiva - clear Sclera- Clear EOMI                Ears - EAC's Wnl TM's Wnl Gross Hearing WNL                Nose - Nares patent                 Throat - oropharanx wnl Respiratory - Lungs CTA bilateral Cardiac - RRR S1 and S2 without murmur GI - Abdomen soft tender in LUQ and LLQ  and bowel sounds active x 4 GU- Prostate is soft mildly enlarged no masses.  Stool negative for occult blood. Extremities - No DP pulses but 2 plus PT pulses bilateral.  Feel cool, color normal And capillary refill wnl <2seconds bilateral toes and feet.. Neuro - Grossly intact.       Assessment & Plan:  Pain with urination - Plan: POCT urinalysis dipstick, POCT UA - Microscopic Only, ciprofloxacin (CIPRO) 500 MG tablet, metroNIDAZOLE (FLAGYL) 500 MG tablet Urinalysis is normal, prostate exam is normal.  Explained that cipro will help if he has some prostatitis but I believe his bigger problem is the suspected diverticulitis. Follow up in one week.  Diverticulitis of colon  (without mention of hemorrhage) - Plan: ciprofloxacin (CIPRO) 500 MG tablet, metroNIDAZOLE (FLAGYL) 500 MG  Continue current pain medication and discussed he go to ED if over the weekend the abdominal pain worsens.  He is advised to follow up next week and prn if sxs Persist or worsen.  Pain in joint, lower leg, unspecified laterality - Plan: Lower Extremity Arterial Doppler Bilateral.  Explained he should not soak his legs in the bath tub.

## 2013-04-19 ENCOUNTER — Encounter (HOSPITAL_COMMUNITY): Payer: Self-pay

## 2013-04-19 ENCOUNTER — Emergency Department (HOSPITAL_COMMUNITY)
Admission: EM | Admit: 2013-04-19 | Discharge: 2013-04-19 | Disposition: A | Discharge status: Home / Self Care | Payer: Medicare Other | Attending: Emergency Medicine | Admitting: Emergency Medicine

## 2013-04-19 DIAGNOSIS — R339 Retention of urine, unspecified: Secondary | ICD-10-CM | POA: Insufficient documentation

## 2013-04-19 DIAGNOSIS — I10 Essential (primary) hypertension: Secondary | ICD-10-CM | POA: Insufficient documentation

## 2013-04-19 DIAGNOSIS — K219 Gastro-esophageal reflux disease without esophagitis: Secondary | ICD-10-CM | POA: Insufficient documentation

## 2013-04-19 DIAGNOSIS — J45909 Unspecified asthma, uncomplicated: Secondary | ICD-10-CM | POA: Insufficient documentation

## 2013-04-19 DIAGNOSIS — R739 Hyperglycemia, unspecified: Secondary | ICD-10-CM

## 2013-04-19 DIAGNOSIS — G629 Polyneuropathy, unspecified: Secondary | ICD-10-CM

## 2013-04-19 DIAGNOSIS — R000 Tachycardia, unspecified: Secondary | ICD-10-CM | POA: Insufficient documentation

## 2013-04-19 DIAGNOSIS — E785 Hyperlipidemia, unspecified: Secondary | ICD-10-CM | POA: Insufficient documentation

## 2013-04-19 DIAGNOSIS — R209 Unspecified disturbances of skin sensation: Secondary | ICD-10-CM | POA: Insufficient documentation

## 2013-04-19 DIAGNOSIS — R109 Unspecified abdominal pain: Secondary | ICD-10-CM | POA: Insufficient documentation

## 2013-04-19 DIAGNOSIS — Z85820 Personal history of malignant melanoma of skin: Secondary | ICD-10-CM | POA: Insufficient documentation

## 2013-04-19 DIAGNOSIS — IMO0002 Reserved for concepts with insufficient information to code with codable children: Secondary | ICD-10-CM | POA: Insufficient documentation

## 2013-04-19 DIAGNOSIS — E669 Obesity, unspecified: Secondary | ICD-10-CM | POA: Insufficient documentation

## 2013-04-19 DIAGNOSIS — G609 Hereditary and idiopathic neuropathy, unspecified: Secondary | ICD-10-CM | POA: Insufficient documentation

## 2013-04-19 DIAGNOSIS — Z9889 Other specified postprocedural states: Secondary | ICD-10-CM | POA: Insufficient documentation

## 2013-04-19 DIAGNOSIS — I2510 Atherosclerotic heart disease of native coronary artery without angina pectoris: Secondary | ICD-10-CM | POA: Insufficient documentation

## 2013-04-19 DIAGNOSIS — I252 Old myocardial infarction: Secondary | ICD-10-CM | POA: Insufficient documentation

## 2013-04-19 DIAGNOSIS — F17200 Nicotine dependence, unspecified, uncomplicated: Secondary | ICD-10-CM | POA: Insufficient documentation

## 2013-04-19 DIAGNOSIS — F329 Major depressive disorder, single episode, unspecified: Secondary | ICD-10-CM | POA: Insufficient documentation

## 2013-04-19 DIAGNOSIS — Z79899 Other long term (current) drug therapy: Secondary | ICD-10-CM | POA: Insufficient documentation

## 2013-04-19 DIAGNOSIS — Z794 Long term (current) use of insulin: Secondary | ICD-10-CM | POA: Insufficient documentation

## 2013-04-19 DIAGNOSIS — E1169 Type 2 diabetes mellitus with other specified complication: Secondary | ICD-10-CM | POA: Insufficient documentation

## 2013-04-19 DIAGNOSIS — R34 Anuria and oliguria: Secondary | ICD-10-CM | POA: Insufficient documentation

## 2013-04-19 LAB — CBC WITH DIFFERENTIAL/PLATELET
Basophils Absolute: 0 10*3/uL (ref 0.0–0.1)
HCT: 44.6 % (ref 39.0–52.0)
Lymphocytes Relative: 25 % (ref 12–46)
Monocytes Absolute: 0.9 10*3/uL (ref 0.1–1.0)
Neutro Abs: 5.9 10*3/uL (ref 1.7–7.7)
RBC: 4.85 MIL/uL (ref 4.22–5.81)
RDW: 12.6 % (ref 11.5–15.5)
WBC: 9.2 10*3/uL (ref 4.0–10.5)

## 2013-04-19 LAB — COMPREHENSIVE METABOLIC PANEL
ALT: 20 U/L (ref 0–53)
AST: 16 U/L (ref 0–37)
CO2: 28 mEq/L (ref 19–32)
Chloride: 92 mEq/L — ABNORMAL LOW (ref 96–112)
Creatinine, Ser: 1.27 mg/dL (ref 0.50–1.35)
GFR calc non Af Amer: 66 mL/min — ABNORMAL LOW (ref >90)
Sodium: 128 mEq/L — ABNORMAL LOW (ref 135–145)
Total Bilirubin: 0.5 mg/dL (ref 0.3–1.2)

## 2013-04-19 LAB — URINALYSIS, ROUTINE W REFLEX MICROSCOPIC
Glucose, UA: 500 mg/dL — AB
Hgb urine dipstick: NEGATIVE
Ketones, ur: NEGATIVE mg/dL
Protein, ur: NEGATIVE mg/dL

## 2013-04-19 MED ORDER — OXYCODONE-ACETAMINOPHEN 5-325 MG PO TABS: 1.0000 | ORAL_TABLET | Freq: Four times a day (QID) | ORAL | Status: DC | PRN

## 2013-04-19 MED ORDER — SODIUM CHLORIDE 0.9 % IV BOLUS (SEPSIS)
500.0000 mL | Freq: Once | INTRAVENOUS | Status: AC
  Administered 2013-04-19: 500 mL via INTRAVENOUS

## 2013-04-19 NOTE — ED Notes (Signed)
In and out catheter completed by S. Joseph Art, Teacher, music. Patient attempted to use urinal prior to cath for a post void residual. Patient unable to urinate. 325 mls drained from bladder via catheter. Tolerated procedure well. Urine specimen obtained and sent to lab.

## 2013-04-19 NOTE — ED Notes (Addendum)
1. Pt reports left flank pain since last week was seen in the ed for the same, has been unable to urinate since 3am this morning. Denies any nausea or vomiting, no fever or diarrhea 2.  Also would like his right foot checked, thinks it is cooler than the left.

## 2013-04-19 NOTE — ED Provider Notes (Signed)
History    CSN: [id number] Arrival date & time 04/19/13  1642  First MD Initiated Contact with Patient 04/19/13 1654     Chief Complaint  Patient presents with  . Flank Pain  . Urinary Retention  . Foot Problem   (Consider location/radiation/quality/duration/timing/severity/associated sxs/prior Treatment) Patient is a 47 y.o. male presenting with flank pain. The history is provided by the patient.  Flank Pain This is a recurrent problem. Associated symptoms include abdominal pain. Pertinent negatives include no chest pain, no headaches and no shortness of breath.   patient's had pain in his left flank for the last week. He was seen in the ED and had negative CT scan. He was then seen at his primary care doctor's office who started him on antibiotics for diverticulitis. He states he is at continued pain. It is worse with certain positions. He has had some urinary frequency. No nausea vomiting or diarrhea. No fevers. He states he has not been able to urinate since one in the morning. He is on Cipro and Flagyl. He states the pain medicines did not help. He also states that his right foot his been going numb. It is worse with walking.  Past Medical History  Diagnosis Date  . Diabetes mellitus     Type 2  . Hyperlipidemia   . Depression   . Coronary artery disease     s/p BMS to Ramus 10/2010;  Cath 01/22/12 prox 30-40% LAD, LCx ramus w/ hazy 70-80% in-stent restenosis, EF 60% treated medically  . HTN (hypertension)   . Tobacco abuse   . Obesity   . Anxiety   . Melanoma 2007    surgery at [facility], Followed by [facility]  . Myocardial infarction 2012  . GERD (gastroesophageal reflux disease)   . Hyperlipidemia   . Asthma   . Coronary artery disease     s/p BMS to Ramus 10/2010;  Cath 01/22/12 prox 30-40% LAD, LCx ramus w/ hazy 70-80% in-stent restenosis, EF 60% treated medically   . Major depression, chronic 08/13/2012   Past Surgical History  Procedure Laterality Date  . Melanoma  surgery  2007    [facility], removed lymph nodes under arm as well,Left abd  . Cardiac catheterization      with stent  . Esophagogastroduodenoscopy  04/24/2012    Rourk-mild erosive reflux esophagitis,dilated w/82F Elease Hashimoto, small HH, minimal chronic gastric/bulbar erosions(No H pylori)  . Colonoscopy N/A 01/27/2013    Procedure: COLONOSCOPY;  Surgeon: Corbin Ade, MD;  Location: [facility];  Service: Endoscopy;  Laterality: N/A;  2:00-moved to [address] to notify pt   Family History  Problem Relation Age of Onset  . Lung cancer    . Heart disease    . Other      not real familiar with family history  . Colon cancer Neg Hx   . Colon polyps Neg Hx    History  Substance Use Topics  . Smoking status: Current Every Day Smoker -- 0.50 packs/day for 30 years    Types: Cigarettes    Start date: 10/09/1981  . Smokeless tobacco: Never Used  . 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  HENT: Negative for neck stiffness.   Eyes: Negative for pain.  Respiratory: Negative for chest tightness and shortness of breath.   Cardiovascular: Negative for chest pain and leg swelling.  Gastrointestinal: Positive  for abdominal pain. Negative for nausea, vomiting and diarrhea.  Genitourinary: Positive for flank pain and decreased urine volume.  Musculoskeletal: Negative for back pain.  Skin: Negative for rash.  Neurological: Positive for numbness. Negative for weakness and headaches.  Psychiatric/Behavioral: Negative for behavioral problems.    Allergies  Review of patient's allergies indicates no known allergies.  Home Medications   Current Outpatient Rx  Name  Route  Sig  Dispense  Refill  . atorvastatin (LIPITOR) 80 MG tablet   Oral   Take 1 tablet (80 mg total) by mouth daily.   90 tablet   3   . ciprofloxacin (CIPRO) 500 MG tablet   Oral   Take 1 tablet (500 mg  total) by mouth 2 (two) times daily.   20 tablet   0   . gabapentin (NEURONTIN) 400 MG capsule   Oral   Take 400 mg by mouth 4 (four) times daily.         . hydrocortisone (ANUSOL-HC) 2.5 % rectal cream   Rectal   Place rectally 2 (two) times daily.   30 g   0   . Insulin Glargine (LANTUS SOLOSTAR) 100 UNIT/ML SOPN   Subcutaneous   Inject 42 Units into the skin at bedtime.         [name] lisinopril (PRINIVIL,ZESTRIL) 20 MG tablet   Oral   Take 1 tablet (20 mg total) by mouth daily.   90 tablet   3   . metoprolol succinate (TOPROL-XL) 25 MG 24 hr tablet   Oral   Take 1 tablet (25 mg total) by mouth daily.   30 tablet   6   . metroNIDAZOLE (FLAGYL) 500 MG tablet   Oral   Take 1 tablet (500 mg total) by mouth 3 (three) times daily.   30 tablet   0   . mometasone-formoterol (DULERA) 100-5 MCG/ACT AERO   Inhalation   Inhale 2 puffs into the lungs 2 (two) times daily.   1 Inhaler   0   . nitroGLYCERIN (NITROSTAT) 0.4 MG SL tablet   Sublingual   Place 1 tablet (0.4 mg total) under the tongue every 5 (five) minutes as needed. For chest pain.   25 tablet   3   . omeprazole (PRILOSEC) 40 MG capsule   Oral   Take 1 capsule (40 mg total) by mouth 2 (two) times daily.   60 capsule   0   . sertraline (ZOLOFT) 50 MG tablet   Oral   Take 1 tablet (50 mg total) by mouth daily.   30 tablet   0   . traMADol (ULTRAM) 50 MG tablet   Oral   Take 1 tablet (50 mg total) by mouth every 6 (six) hours as needed for pain.   30 tablet   0   . traZODone (DESYREL) 50 MG tablet   Oral   Take 50 mg by mouth at bedtime.         [name] albuterol (PROVENTIL HFA;VENTOLIN HFA) 108 (90 BASE) MCG/ACT inhaler   Inhalation   Inhale 2 puffs into the lungs every 6 (six) hours as needed for wheezing.   1 Inhaler   2   . albuterol (PROVENTIL) (2.5 MG/3ML) 0.083% nebulizer solution   Nebulization   Take 3 mLs (2.5 mg total) by nebulization 2 (two) times daily as needed for wheezing or  shortness of breath.   75 mL   3   . oxyCODONE-acetaminophen (PERCOCET/ROXICET) 5-325 MG per tablet   Oral  Take 1-2 tablets by mouth every 6 (six) hours as needed for pain.   10 tablet   0    BP 111/74  Pulse 73  Temp(Src) 98.2 F (36.8 C) (Oral)  Resp 20  Ht 6\' 2"  (1.88 m)  Wt 216 lb (97.977 kg)  BMI 27.72 kg/m2  SpO2 95% Physical Exam  Nursing note and vitals reviewed. Constitutional: He is oriented to person, place, and time. He appears well-developed and well-nourished.  HENT:  Head: Normocephalic and atraumatic.  Eyes: EOM are normal. Pupils are equal, round, and reactive to light.  Neck: Normal range of motion. Neck supple.  Cardiovascular: Regular rhythm and normal heart sounds.   No murmur heard. Mild tachycardia  Pulmonary/Chest: Effort normal and breath sounds normal.  Abdominal: Soft. Bowel sounds are normal. He exhibits no distension and no mass. There is tenderness. There is no rebound and no guarding.  Tenderness to left abdomen. No rebound or guarding.  Genitourinary:  Tenderness to left flank. No rash.  Musculoskeletal: Normal range of motion. He exhibits no edema.  Neurological: He is alert and oriented to person, place, and time. No cranial nerve deficit.  Skin: Skin is warm and dry. No rash noted.  Psychiatric: He has a normal mood and affect.    ED Course  Procedures (including critical care time) Labs Reviewed  CBC WITH DIFFERENTIAL - Abnormal; Notable for the following:    MCHC 36.3 (*)    All other components within normal limits  COMPREHENSIVE METABOLIC PANEL - Abnormal; Notable for the following:    Sodium 128 (*)    Chloride 92 (*)    Glucose, Bld 243 (*)    GFR calc non Af Amer 66 (*)    GFR calc Af Amer 76 (*)    All other components within normal limits  LIPASE, BLOOD - Abnormal; Notable for the following:    Lipase 10 (*)    All other components within normal limits  URINALYSIS, ROUTINE W REFLEX MICROSCOPIC - Abnormal; Notable  for the following:    Specific Gravity, Urine >1.030 (*)    Glucose, UA 500 (*)    All other components within normal limits   No results found. 1. Abdominal pain   2. Flank pain   3. Peripheral neuropathy   4. Hyperglycemia   5. Urinary retention     MDM  Patient with abdominal pain on the left side. Recently seen for same and had negative CT. Was then seen by PCP that started Cipro Flagyl. White count is normal here. CT did not show diverticulitis or diverticulosis. He has mild hyperglycemia. He is not in DKA. He initially had 350 cc of urine he was unable to void. He later voided spontaneously. I feel he can be discharged home. He has had several other episodes of abdominal pain without a clear cause. I think the Cipro and Flagyl can be stopped. And we discharged with some pain medicines will followup with his PCP and urology  Juliet Rude. Rubin Payor, MD 04/19/13 2038

## 2013-04-22 ENCOUNTER — Other Ambulatory Visit: Payer: Self-pay | Admitting: Family Medicine

## 2013-04-22 ENCOUNTER — Other Ambulatory Visit: Payer: Self-pay | Admitting: *Deleted

## 2013-04-22 ENCOUNTER — Encounter: Payer: Self-pay | Admitting: Internal Medicine

## 2013-04-22 ENCOUNTER — Telehealth: Payer: Self-pay

## 2013-04-22 DIAGNOSIS — M25569 Pain in unspecified knee: Secondary | ICD-10-CM

## 2013-04-22 NOTE — Telephone Encounter (Signed)
Open in error

## 2013-04-23 ENCOUNTER — Encounter: Payer: Self-pay | Admitting: Family Medicine

## 2013-04-23 ENCOUNTER — Ambulatory Visit (HOSPITAL_COMMUNITY)
Admission: RE | Admit: 2013-04-23 | Discharge: 2013-04-23 | Disposition: A | Discharge status: Home / Self Care | Payer: Medicare Other | Source: Ambulatory Visit | Attending: Family Medicine | Admitting: Family Medicine

## 2013-04-23 ENCOUNTER — Ambulatory Visit (INDEPENDENT_AMBULATORY_CARE_PROVIDER_SITE_OTHER): Payer: Medicare Other | Admitting: Family Medicine

## 2013-04-23 VITALS — BP 121/78 | HR 79 | Temp 98.9°F | Wt 216.0 lb

## 2013-04-23 DIAGNOSIS — M79609 Pain in unspecified limb: Secondary | ICD-10-CM | POA: Insufficient documentation

## 2013-04-23 DIAGNOSIS — R339 Retention of urine, unspecified: Secondary | ICD-10-CM

## 2013-04-23 DIAGNOSIS — K649 Unspecified hemorrhoids: Secondary | ICD-10-CM

## 2013-04-23 DIAGNOSIS — R739 Hyperglycemia, unspecified: Secondary | ICD-10-CM

## 2013-04-23 DIAGNOSIS — R7309 Other abnormal glucose: Secondary | ICD-10-CM

## 2013-04-23 DIAGNOSIS — N400 Enlarged prostate without lower urinary tract symptoms: Secondary | ICD-10-CM

## 2013-04-23 DIAGNOSIS — E119 Type 2 diabetes mellitus without complications: Secondary | ICD-10-CM

## 2013-04-23 MED ORDER — METFORMIN HCL 1000 MG PO TABS: 1000.0000 mg | ORAL_TABLET | Freq: Two times a day (BID) | ORAL | Status: DC

## 2013-04-23 MED ORDER — TAMSULOSIN HCL 0.4 MG PO CAPS: 0.4000 mg | ORAL_CAPSULE | Freq: Every day | ORAL | Status: DC

## 2013-04-23 NOTE — Progress Notes (Signed)
  Subjective:    Patient ID: Dustin Cruz, male    DOB: 10/17/1965, 47 y.o.   MRN: [id number]  HPI This 47 y.o. male presents for evaluation of recent ER visit.  He was recently seen in the ED for obstructive voiding and had to have a foley catheter inserted because he couldn't void. He was seen a week ago for abdominal pain and tx'd for diverticulitis with cipro and flagyl.  He has Been feeling better and not having any abdominal pain.  He has been having problems with hemorrhoids And has been using cream which is helping.  He has been having difficulties following diabetic regimen and his  Wife who accompanies him states he eats wrong and his sugars are high.  He has been having some Problems with nocturia.  He had elevated blood sugar in the ER and in his UA.  He was on glucophage But is no longer taking this.  He is using Lantus insulin now.  His wife who accompanies him states That he is not following his diabetic regimen like he should and he has learning problems and problems. Understanding.   Review of Systems  C/o hemorrhoids, and elevated blood sugars, and nocturia.    No chest pain, SOB, HA, dizziness, vision change, N/V, diarrhea, constipation, dysuria, myalgias, arthralgias or rash.  Objective:   Physical Exam Vital signs noted  Well developed well nourished male.  HEENT - Head atraumatic Normocephalic                Eyes - PERRLA, Conjuctiva - clear Sclera- Clear EOMI                Ears - EAC's Wnl TM's Wnl Gross Hearing WNL                Nose - Nares patent                 Throat - oropharanx wnl Respiratory - Lungs CTA bilateral Cardiac - RRR S1 and S2 without murmur GI - Abdomen soft Nontender and bowel sounds active x 4 GU - Mildly enlarged prostate without masses or pain. Extremities - No edema. Neuro - Grossly intact.       Assessment & Plan:  BPH (benign prostatic hyperplasia) - Plan: tamsulosin (FLOMAX) 0.4 MG CAPS  Diabetes - His hgbaic in June was  6.9% and he was on metformin at that time.  Add metformin and continue current Lantus dose.  Urinary retention - Flomax rx.  He is doing fine right now and recommend he follow up prn.  Hyperglycemia - Resume Metformin.  Follow up in 3 months or prn.  Hemorrhoids - Continue Anusol HC cream.

## 2013-04-23 NOTE — Patient Instructions (Addendum)

## 2013-04-24 ENCOUNTER — Ambulatory Visit (INDEPENDENT_AMBULATORY_CARE_PROVIDER_SITE_OTHER): Payer: Medicare Other | Admitting: Gastroenterology

## 2013-04-24 ENCOUNTER — Encounter: Payer: Self-pay | Admitting: Gastroenterology

## 2013-04-24 VITALS — BP 110/74 | HR 91 | Temp 97.6°F | Ht 74.0 in | Wt 213.6 lb

## 2013-04-24 DIAGNOSIS — R110 Nausea: Secondary | ICD-10-CM

## 2013-04-24 DIAGNOSIS — R109 Unspecified abdominal pain: Secondary | ICD-10-CM

## 2013-04-24 NOTE — Progress Notes (Signed)
CC PCP 

## 2013-04-24 NOTE — Assessment & Plan Note (Signed)
47 year old male with history of chronic abdominal pain, returns today in follow-up. Notes continued lower abdominal discomfort, and I believe he may benefit from a pain management referral in the future. +Carnett's sign on exam, and his complaints of pain are related to movement and exertion. Doubt lower abdominal pain is a GI issue; TCS is up-to-date, and he will need repeat lower GI evaluation April 2015 due to multiple adenomatous polyps on last exam. He is being treated for possible diverticulitis by his PCP, although CT was negative for this and colonoscopy did not reveal diverticulosis. With his chronic back pain, I also question a radicular component.  As a separate issue, new onset of epigastric discomfort; EGD is up-to-date as of July 2013. Vague reports of discomfort, with GERD symptoms controlled on Prilosec BID. Gallbladder remains in situ. Recommend strict adherence to low-fat diet. May need HIDA scan in near future to rule out biliary component. Lipase on file is normal, including LFTs.  Return in 4-6 weeks to see Dr. Jena Gauss due to continued abdominal pain. Recommend Pain Management referral in future. Doubt laparoscopic exploration would be beneficial. Likely mulitfactorial. Pt to call next week with progress report regarding epigastric discomfort. Proceed with HIDA if persists.

## 2013-04-24 NOTE — Progress Notes (Signed)
Referring Provider: Ernestina Penna, MD Primary Care Physician:  Redmond Baseman, MD Primary GI: Dr. Jena Gauss   Chief Complaint  Patient presents with  . Abdominal Pain    HPI:   47 year old male presents today in follow-up with a history of fatty liver, RLQ pain, negative CT April 2014. Colonoscopy pursued with adenomatous polyps; needs surveillance April 2015. Recently seen in ED with abdominal pain; negative for diverticulitis on CT. Sees urology due to BPH. Started on Flomax.   States PCP treating for diverticulitis. Some improvement with Cipro and Flagyl, taking a week thus far. Notes continued lower abdominal pain with walking. Sitting resolves discomfort. Unable to describe. States sometimes hurts at night, has to walk around to "ease off". Loose stool for 2 days. Usually just once per day. Notes mild epigastric discomfort this morning. Taking Prilosec BID. Feels this does well with heart burn.   Dec 2013: 227 Currently 215. ONLY EATING ONCE PER DAY. Doesn't feel hungry. Notes chronic nausea. Feels full off of smaller amounts than normal.   Past Medical History  Diagnosis Date  . Diabetes mellitus     Type 2  . Hyperlipidemia   . Depression   . Coronary artery disease     s/p BMS to Ramus 10/2010;  Cath 01/22/12 prox 30-40% LAD, LCx ramus w/ hazy 70-80% in-stent restenosis, EF 60% treated medically  . HTN (hypertension)   . Tobacco abuse   . Obesity   . Anxiety   . Melanoma 2007    surgery at [facility], Followed by [facility]  . Myocardial infarction 2012  . GERD (gastroesophageal reflux disease)   . Hyperlipidemia   . Asthma   . Coronary artery disease     s/p BMS to Ramus 10/2010;  Cath 01/22/12 prox 30-40% LAD, LCx ramus w/ hazy 70-80% in-stent restenosis, EF 60% treated medically   . Major depression, chronic 08/13/2012    Past Surgical History  Procedure Laterality Date  . Melanoma surgery  2007    [facility], removed lymph nodes under arm as well,Left abd  . Cardiac  catheterization      with stent  . Esophagogastroduodenoscopy  04/24/2012    Rourk-mild erosive reflux esophagitis,dilated w/56F Elease Hashimoto, small HH, minimal chronic gastric/bulbar erosions(No H pylori)  . Colonoscopy N/A 01/27/2013    ZOX:WRUEAV and colonic polyps. Tubular adenomas    Current Outpatient Prescriptions  Medication Sig Dispense Refill  . albuterol (PROVENTIL HFA;VENTOLIN HFA) 108 (90 BASE) MCG/ACT inhaler Inhale 2 puffs into the lungs every 6 (six) hours as needed for wheezing.  1 Inhaler  2  . albuterol (PROVENTIL) (2.5 MG/3ML) 0.083% nebulizer solution Take 3 mLs (2.5 mg total) by nebulization 2 (two) times daily as needed for wheezing or shortness of breath.  75 mL  3  . atorvastatin (LIPITOR) 80 MG tablet Take 1 tablet (80 mg total) by mouth daily.  90 tablet  3  . ciprofloxacin (CIPRO) 500 MG tablet Take 1 tablet (500 mg total) by mouth 2 (two) times daily.  20 tablet  0  . gabapentin (NEURONTIN) 400 MG capsule Take 400 mg by mouth 4 (four) times daily.      . Insulin Glargine (LANTUS SOLOSTAR) 100 UNIT/ML SOPN Inject 42 Units into the skin at bedtime.      [name] lisinopril (PRINIVIL,ZESTRIL) 20 MG tablet Take 1 tablet (20 mg total) by mouth daily.  90 tablet  3  . metFORMIN (GLUCOPHAGE) 1000 MG tablet Take 1 tablet (1,000 mg total) by mouth 2 (two) times daily  with a meal.  180 tablet  3  . metoprolol succinate (TOPROL-XL) 25 MG 24 hr tablet Take 1 tablet (25 mg total) by mouth daily.  30 tablet  6  . metroNIDAZOLE (FLAGYL) 500 MG tablet Take 1 tablet (500 mg total) by mouth 3 (three) times daily.  30 tablet  0  . mometasone-formoterol (DULERA) 100-5 MCG/ACT AERO Inhale 2 puffs into the lungs 2 (two) times daily.  1 Inhaler  0  . nitroGLYCERIN (NITROSTAT) 0.4 MG SL tablet Place 1 tablet (0.4 mg total) under the tongue every 5 (five) minutes as needed. For chest pain.  25 tablet  3  . omeprazole (PRILOSEC) 40 MG capsule Take 1 capsule (40 mg total) by mouth 2 (two) times daily.  60  capsule  0  . oxyCODONE-acetaminophen (PERCOCET/ROXICET) 5-325 MG per tablet Take 1-2 tablets by mouth every 6 (six) hours as needed for pain.  10 tablet  0  . sertraline (ZOLOFT) 50 MG tablet Take 1 tablet (50 mg total) by mouth daily.  30 tablet  0  . tamsulosin (FLOMAX) 0.4 MG CAPS Take 1 capsule (0.4 mg total) by mouth daily.  30 capsule  3  . traMADol (ULTRAM) 50 MG tablet Take 1 tablet (50 mg total) by mouth every 6 (six) hours as needed for pain.  30 tablet  0  . traZODone (DESYREL) 50 MG tablet Take 50 mg by mouth at bedtime.      . hydrocortisone (ANUSOL-HC) 2.5 % rectal cream Place rectally 2 (two) times daily.  30 g  0  . [DISCONTINUED] metoprolol tartrate (LOPRESSOR) 25 MG tablet Take 25 mg by mouth 2 (two) times daily.         No current facility-administered medications for this visit.    Allergies as of 04/24/2013  . (No Known Allergies)    Family History  Problem Relation Age of Onset  . Lung cancer    . Heart disease    . Other      not real familiar with family history  . Colon cancer Neg Hx   . Colon polyps Neg Hx     History   Social History  . Marital Status: Married    Spouse Name: rayann Ahonen    Number of Children: 0  . Years of Education: N/A   Occupational History  . disabled    Social History Main Topics  . Smoking status: Current Every Day Smoker -- 0.50 packs/day for 30 years    Types: Cigarettes    Start date: 10/09/1981  . Smokeless tobacco: Never Used  . 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 Drug Use: No  . Sexually Active: Yes   Other Topics Concern  . None   Social History Narrative   Lives w/ wife    Review of Systems: Negative unless mentioned in HPI.   Physical Exam: BP 110/74  Pulse 91  Temp(Src) 97.6 F (36.4 C) (Oral)  Ht 6\' 2"  (1.88 m)  Wt 213 lb 9.6 oz (96.888 kg)  BMI 27.41 kg/m2 General:   Alert and oriented. No distress noted. Pleasant and cooperative.  Head:  Normocephalic  and atraumatic. Eyes:  Conjuctiva clear without scleral icterus. Heart:  S1, S2 present without murmurs, rubs, or gallops. Regular rate and rhythm. Abdomen:  +BS, soft, mild tenderness to palpation epigastric region and non-distended. No rebound or guarding. No HSM or masses noted. +CARNETT'S SIGN lower abdomen.  Msk:  Symmetrical without gross  deformities. Normal posture. Extremities:  Without edema. Neurologic:  Alert and  oriented x4;  grossly normal neurologically. Skin:  Intact without significant lesions or rashes. Psych:  Alert and cooperative. Normal mood and affect.  Lab Results  Component Value Date   ALT 20 04/19/2013   AST 16 04/19/2013   ALKPHOS 68 04/19/2013   BILITOT 0.5 04/19/2013   Lab Results  Component Value Date   LIPASE 10* 04/19/2013   Lab Results  Component Value Date   CREATININE 1.27 04/19/2013   BUN 20 04/19/2013   NA 128* 04/19/2013   K 4.1 04/19/2013   CL 92* 04/19/2013   CO2 28 04/19/2013

## 2013-04-24 NOTE — Assessment & Plan Note (Signed)
Noted weight loss of 12 lbs, early satiety, and decreased appetite. Query underlying diabetic gastroparesis. EGD on file. GES in near future. Continue Prilosec BID.

## 2013-04-24 NOTE — Patient Instructions (Addendum)
I have ordered an emptying study to see how well your stomach empties.   Continue Prilosec twice a day.   Follow a low-fat diet for now. If the pain in your upper belly continues or worsens, call immediately. Call us next week with an update. If it still continues, we will need to do extra tests.   I will have you return in 4-6 weeks to see Dr. Jena Gauss,   Fat and Cholesterol Control Diet Cholesterol levels in your body are determined significantly by your diet. Cholesterol levels may also be related to heart disease. The following material helps to explain this relationship and discusses what you can do to help keep your heart healthy. Not all cholesterol is bad. Low-density lipoprotein (LDL) cholesterol is the "bad" cholesterol. It may cause fatty deposits to build up inside your arteries. High-density lipoprotein (HDL) cholesterol is "good." It helps to remove the "bad" LDL cholesterol from your blood. Cholesterol is a very important risk factor for heart disease. Other risk factors are high blood pressure, smoking, stress, heredity, and weight. The heart muscle gets its supply of blood through the coronary arteries. If your LDL cholesterol is high and your HDL cholesterol is low, you are at risk for having fatty deposits build up in your coronary arteries. This leaves less room through which blood can flow. Without sufficient blood and oxygen, the heart muscle cannot function properly and you may feel chest pains (angina pectoris). When a coronary artery closes up entirely, a part of the heart muscle may die causing a heart attack (myocardial infarction). CHECKING CHOLESTEROL When your caregiver sends your blood to a lab to be examined for cholesterol, a complete lipid (fat) profile may be done. With this test, the total amount of cholesterol and levels of LDL and HDL are determined. Triglycerides are a type of fat that circulates in the blood. They can also be used to determine heart disease risk. The  list below describes what the numbers should be: Test: Total Cholesterol.  Less than 200 mg/dl. Test: LDL "bad cholesterol."  Less than 100 mg/dl.  Less than 70 mg/dl if you are at very high risk of a heart attack or sudden cardiac death. Test: HDL "good cholesterol."  Greater than 50 mg/dl for women.  Greater than 40 mg/dl for men. Test: Triglycerides.  Less than 150 mg/dl. CONTROLLING CHOLESTEROL WITH DIET Although exercise and lifestyle factors are important, your diet is key. That is because certain foods are known to raise cholesterol and others to lower it. The goal is to balance foods for their effect on cholesterol and more importantly, to replace saturated and trans fat with other types of fat, such as monounsaturated fat, polyunsaturated fat, and omega-3 fatty acids. On average, a person should consume no more than 15 to 17 g of saturated fat daily. Saturated and trans fats are considered "bad" fats, and they will raise LDL cholesterol. Saturated fats are primarily found in animal products such as meats, butter, and cream. However, that does not mean you need to give up all your favorite foods. Today, there are good tasting, low-fat, low-cholesterol substitutes for most of the things you like to eat. Choose low-fat or nonfat alternatives. Choose round or loin cuts of red meat. These types of cuts are lowest in fat and cholesterol. Chicken (without the skin), fish, veal, and ground [place] breast are great choices. Eliminate fatty meats, such as hot dogs and salami. Even shellfish have little or no saturated fat. Have a 3 oz (  85 g) portion when you eat lean meat, poultry, or fish. Trans fats are also called "partially hydrogenated oils." They are oils that have been scientifically manipulated so that they are solid at room temperature resulting in a longer shelf life and improved taste and texture of foods in which they are added. Trans fats are found in stick margarine, some tub  margarines, cookies, crackers, and baked goods.  When baking and cooking, oils are a great substitute for butter. The monounsaturated oils are especially beneficial since it is believed they lower LDL and raise HDL. The oils you should avoid entirely are saturated tropical oils, such as coconut and palm.  Remember to eat a lot from food groups that are naturally free of saturated and trans fat, including fish, fruit, vegetables, beans, grains (barley, rice, couscous, bulgur wheat), and pasta (without cream sauces).  IDENTIFYING FOODS THAT LOWER CHOLESTEROL  Soluble fiber may lower your cholesterol. This type of fiber is found in fruits such as apples, vegetables such as broccoli, potatoes, and carrots, legumes such as beans, peas, and lentils, and grains such as barley. Foods fortified with plant sterols (phytosterol) may also lower cholesterol. You should eat at least 2 g per day of these foods for a cholesterol lowering effect.  Read package labels to identify low-saturated fats, trans fat free, and low-fat foods at the supermarket. Select cheeses that have only 2 to 3 g saturated fat per ounce. Use a heart-healthy tub margarine that is free of trans fats or partially hydrogenated oil. When buying baked goods (cookies, crackers), avoid partially hydrogenated oils. Breads and muffins should be made from whole grains (whole-wheat or whole oat flour, instead of "flour" or "enriched flour"). Buy non-creamy canned soups with reduced salt and no added fats.  FOOD PREPARATION TECHNIQUES  Never deep-fry. If you must fry, either stir-fry, which uses very little fat, or use non-stick cooking sprays. When possible, broil, bake, or roast meats, and steam vegetables. Instead of putting butter or margarine on vegetables, use lemon and herbs, applesauce, and cinnamon (for squash and sweet potatoes), nonfat yogurt, salsa, and low-fat dressings for salads.  LOW-SATURATED FAT / LOW-FAT FOOD SUBSTITUTES Meats / Saturated  Fat (g)  Avoid: Steak, marbled (3 oz/85 g) / 11 g  Choose: Steak, lean (3 oz/85 g) / 4 g  Avoid: Hamburger (3 oz/85 g) / 7 g  Choose: Hamburger, lean (3 oz/85 g) / 5 g  Avoid: Ham (3 oz/85 g) / 6 g  Choose: Ham, lean cut (3 oz/85 g) / 2.4 g  Avoid: Chicken, with skin, dark meat (3 oz/85 g) / 4 g  Choose: Chicken, skin removed, dark meat (3 oz/85 g) / 2 g  Avoid: Chicken, with skin, light meat (3 oz/85 g) / 2.5 g  Choose: Chicken, skin removed, light meat (3 oz/85 g) / 1 g Dairy / Saturated Fat (g)  Avoid: Whole milk (1 cup) / 5 g  Choose: Low-fat milk, 2% (1 cup) / 3 g  Choose: Low-fat milk, 1% (1 cup) / 1.5 g  Choose: Skim milk (1 cup) / 0.3 g  Avoid: Hard cheese (1 oz/28 g) / 6 g  Choose: Skim milk cheese (1 oz/28 g) / 2 to 3 g  Avoid: Cottage cheese, 4% fat (1 cup) / 6.5 g  Choose: Low-fat cottage cheese, 1% fat (1 cup) / 1.5 g  Avoid: Ice cream (1 cup) / 9 g  Choose: Sherbet (1 cup) / 2.5 g  Choose: Nonfat frozen yogurt (1 cup) /  0.3 g  Choose: Frozen fruit bar / trace  Avoid: Whipped cream (1 tbs) / 3.5 g  Choose: Nondairy whipped topping (1 tbs) / 1 g Condiments / Saturated Fat (g)  Avoid: Mayonnaise (1 tbs) / 2 g  Choose: Low-fat mayonnaise (1 tbs) / 1 g  Avoid: Butter (1 tbs) / 7 g  Choose: Extra light margarine (1 tbs) / 1 g  Avoid: Coconut oil (1 tbs) / 11.8 g  Choose: Olive oil (1 tbs) / 1.8 g  Choose: Corn oil (1 tbs) / 1.7 g  Choose: Safflower oil (1 tbs) / 1.2 g  Choose: Sunflower oil (1 tbs) / 1.4 g  Choose: Soybean oil (1 tbs) / 2.4 g  Choose: Canola oil (1 tbs) / 1 g Document Released: 09/25/2005 Document Revised: 12/18/2011 Document Reviewed: 03/16/2011 Exit[employer] Patient Information 2014 [place], [place].

## 2013-04-28 ENCOUNTER — Encounter (HOSPITAL_COMMUNITY): Payer: Self-pay

## 2013-04-28 ENCOUNTER — Encounter (HOSPITAL_COMMUNITY)
Admission: RE | Admit: 2013-04-28 | Discharge: 2013-04-28 | Disposition: A | Discharge status: Home / Self Care | Payer: Medicare Other | Source: Ambulatory Visit | Attending: Gastroenterology | Admitting: Gastroenterology

## 2013-04-28 DIAGNOSIS — R6881 Early satiety: Secondary | ICD-10-CM | POA: Insufficient documentation

## 2013-04-28 DIAGNOSIS — K3189 Other diseases of stomach and duodenum: Secondary | ICD-10-CM | POA: Insufficient documentation

## 2013-04-28 DIAGNOSIS — R110 Nausea: Secondary | ICD-10-CM | POA: Insufficient documentation

## 2013-04-28 MED ORDER — TECHNETIUM TC 99M SULFUR COLLOID
2.0000 | Freq: Once | INTRAVENOUS | Status: AC | PRN
  Administered 2013-04-28: 2 via ORAL

## 2013-05-01 ENCOUNTER — Other Ambulatory Visit: Payer: Self-pay | Admitting: *Deleted

## 2013-05-01 ENCOUNTER — Encounter: Payer: Self-pay | Admitting: *Deleted

## 2013-05-01 DIAGNOSIS — E119 Type 2 diabetes mellitus without complications: Secondary | ICD-10-CM

## 2013-05-01 DIAGNOSIS — E785 Hyperlipidemia, unspecified: Secondary | ICD-10-CM

## 2013-05-01 DIAGNOSIS — E7800 Pure hypercholesterolemia, unspecified: Secondary | ICD-10-CM

## 2013-05-01 DIAGNOSIS — Z720 Tobacco use: Secondary | ICD-10-CM

## 2013-05-01 DIAGNOSIS — I10 Essential (primary) hypertension: Secondary | ICD-10-CM

## 2013-05-01 DIAGNOSIS — I2510 Atherosclerotic heart disease of native coronary artery without angina pectoris: Secondary | ICD-10-CM

## 2013-05-08 LAB — BASIC METABOLIC PANEL
BUN: 11 mg/dL (ref 6–23)
Potassium: 4.4 mEq/L (ref 3.5–5.3)
Sodium: 136 mEq/L (ref 135–145)

## 2013-05-08 LAB — LIPID PANEL
Total CHOL/HDL Ratio: 3.7 Ratio
VLDL: 39 mg/dL (ref 0–40)

## 2013-05-09 ENCOUNTER — Encounter: Payer: Self-pay | Admitting: Cardiology

## 2013-05-09 ENCOUNTER — Encounter: Payer: Self-pay | Admitting: *Deleted

## 2013-05-09 ENCOUNTER — Other Ambulatory Visit: Payer: Self-pay | Admitting: Gastroenterology

## 2013-05-09 DIAGNOSIS — K3184 Gastroparesis: Secondary | ICD-10-CM

## 2013-05-09 HISTORY — DX: Gastroparesis: K31.84

## 2013-05-09 MED ORDER — ONDANSETRON HCL 4 MG PO TABS: 4.0000 mg | ORAL_TABLET | Freq: Three times a day (TID) | ORAL | Status: DC | PRN

## 2013-05-09 NOTE — Progress Notes (Signed)
Quick Note:  Patient has gastroparesis.  EGD on file from last year. Continue Prilosec BID.  I have sent in Zofran to take prn nausea. Needs to follow gastroparesis diet (needs handout). Not a candidate for Reglan as he is on an SSRI, and the effects could be increased (extrapyramidal symptoms) Keep follow-up with Dr. Jena Gauss. ______

## 2013-05-23 ENCOUNTER — Ambulatory Visit (INDEPENDENT_AMBULATORY_CARE_PROVIDER_SITE_OTHER): Payer: Medicare Other | Admitting: Internal Medicine

## 2013-05-23 ENCOUNTER — Ambulatory Visit (INDEPENDENT_AMBULATORY_CARE_PROVIDER_SITE_OTHER): Payer: Medicare Other | Admitting: *Deleted

## 2013-05-23 ENCOUNTER — Encounter: Payer: Self-pay | Admitting: Internal Medicine

## 2013-05-23 VITALS — BP 123/78 | HR 87 | Temp 97.2°F | Ht 74.0 in | Wt 217.6 lb

## 2013-05-23 VITALS — BP 147/82 | HR 86 | Ht 74.0 in | Wt 215.0 lb

## 2013-05-23 DIAGNOSIS — I10 Essential (primary) hypertension: Secondary | ICD-10-CM

## 2013-05-23 DIAGNOSIS — K219 Gastro-esophageal reflux disease without esophagitis: Secondary | ICD-10-CM

## 2013-05-23 DIAGNOSIS — K3184 Gastroparesis: Secondary | ICD-10-CM

## 2013-05-23 DIAGNOSIS — E1149 Type 2 diabetes mellitus with other diabetic neurological complication: Secondary | ICD-10-CM

## 2013-05-23 NOTE — Patient Instructions (Signed)
Gastroparesis diet information provided  Continue twice daily Prilosec  Continue to use Zofran as needed  Office visit with [facility] in 3 months

## 2013-05-23 NOTE — Patient Instructions (Signed)
Your physician recommends that you schedule a follow-up appointment in: TO BE DETERMINED   

## 2013-05-23 NOTE — Progress Notes (Signed)
Pt present today for Blood pressure check. Pt advised that he is feeling well with no problems. BP at this visit is 147/82 HR 86. Pt states that it usually runs around 115/80. Pt also states he did not take medication before coming in the office, he takes it after he eats.. Taking all medications as directed. Pt smokes a Mcelhaney a day. Still not interested in quitting.  Please advise.  Last OV visit: Vitals 136/84 88    STOP TAKING Effient, Fenofibrate  Start Atorvastatin 40mg  daily  Increase Lisinopril to 20mg  daily  FASTING LABS IN 1 MONTH: BMET, Fasting Lipid Panel  Your physician recommends that you schedule a follow-up appointment in 2 months with the nurse for a medication review and blood pressure check.

## 2013-05-23 NOTE — Progress Notes (Signed)
Primary Care Physician:  Redmond Baseman, MD Primary Gastroenterologist:  Dr. Jena Gauss  Pre-Procedure History & Physical: HPI:  Dustin Cruz is a 47 y.o. male here for GERD,  gastroparesis and abdominal pain.  No abdominal pain since last visit. Gastroparesis on recent GES. No dysphagia since esophagus dilated. Gained 4 pounds. Using Zofran when necessary nausea. No vomiting. Will need surveillance colonoscopy 2015.  Past Medical History  Diagnosis Date  . Diabetes mellitus     Type 2  . Hyperlipidemia   . Depression   . Coronary artery disease     s/p BMS to Ramus 10/2010;  Cath 01/22/12 prox 30-40% LAD, LCx ramus w/ hazy 70-80% in-stent restenosis, EF 60% treated medically  . HTN (hypertension)   . Tobacco abuse   . Obesity   . Anxiety   . Melanoma 2007    surgery at [facility], Followed by [facility]  . Myocardial infarction 2012  . GERD (gastroesophageal reflux disease)   . Hyperlipidemia   . Asthma   . Coronary artery disease     s/p BMS to Ramus 10/2010;  Cath 01/22/12 prox 30-40% LAD, LCx ramus w/ hazy 70-80% in-stent restenosis, EF 60% treated medically   . Major depression, chronic 08/13/2012  . Gastroparesis 05/2013  . Tubular adenoma     Past Surgical History  Procedure Laterality Date  . Melanoma surgery  2007    [facility], removed lymph nodes under arm as well,Left abd  . Cardiac catheterization      with stent  . Esophagogastroduodenoscopy  04/24/2012    Jaxzen Vanhorn-mild erosive reflux esophagitis,dilated w/40F Elease Hashimoto, small HH, minimal chronic gastric/bulbar erosions(No H pylori)  . Colonoscopy N/A 01/27/2013    ZOX:WRUEAV and colonic polyps. Tubular adenomas    Prior to Admission medications   Medication Sig Start Date End Date Taking? Authorizing Provider  albuterol (PROVENTIL HFA;VENTOLIN HFA) 108 (90 BASE) MCG/ACT inhaler Inhale 2 puffs into the lungs every 6 (six) hours as needed for wheezing. 08/13/12  Yes Norval Gable, NP  albuterol (PROVENTIL) (2.5 MG/3ML) 0.083%  nebulizer solution Take 3 mLs (2.5 mg total) by nebulization 2 (two) times daily as needed for wheezing or shortness of breath. 04/10/13  Yes Deatra Canter, FNP  atorvastatin (LIPITOR) 80 MG tablet Take 1 tablet (80 mg total) by mouth daily. 03/20/13  Yes Deatra Canter, FNP  ciprofloxacin (CIPRO) 500 MG tablet Take 1 tablet (500 mg total) by mouth 2 (two) times daily. 04/18/13  Yes Deatra Canter, FNP  gabapentin (NEURONTIN) 400 MG capsule Take 400 mg by mouth 4 (four) times daily.   Yes Historical Provider, MD  Insulin Glargine (LANTUS SOLOSTAR) 100 UNIT/ML SOPN Inject 42 Units into the skin at bedtime.   Yes Historical Provider, MD  lisinopril (PRINIVIL,ZESTRIL) 20 MG tablet Take 1 tablet (20 mg total) by mouth daily. 03/12/13  Yes Kathlen Brunswick, MD  metFORMIN (GLUCOPHAGE) 1000 MG tablet Take 1 tablet (1,000 mg total) by mouth 2 (two) times daily with a meal. 04/23/13  Yes Deatra Canter, FNP  metoprolol succinate (TOPROL-XL) 25 MG 24 hr tablet Take 1 tablet (25 mg total) by mouth daily. 09/13/12  Yes Jodelle Gross, NP  metroNIDAZOLE (FLAGYL) 500 MG tablet Take 1 tablet (500 mg total) by mouth 3 (three) times daily. 04/18/13  Yes Deatra Canter, FNP  mometasone-formoterol (DULERA) 100-5 MCG/ACT AERO Inhale 2 puffs into the lungs 2 (two) times daily. 08/13/12  Yes Norval Gable, NP  nitroGLYCERIN (NITROSTAT) 0.4 MG SL tablet Place 1  tablet (0.4 mg total) under the tongue every 5 (five) minutes as needed. For chest pain. 08/13/12  Yes Norval Gable, NP  omeprazole (PRILOSEC) 40 MG capsule Take 1 capsule (40 mg total) by mouth 2 (two) times daily. 11/26/12  Yes Lesle Chris Black, NP  ondansetron (ZOFRAN) 4 MG tablet Take 1 tablet (4 mg total) by mouth every 8 (eight) hours as needed for nausea. 05/09/13  Yes Nira Retort, NP  sertraline (ZOLOFT) 50 MG tablet Take 1 tablet (50 mg total) by mouth daily. 08/13/12  Yes Norval Gable, NP  tamsulosin (FLOMAX) 0.4 MG CAPS Take 1 capsule (0.4 mg  total) by mouth daily. 04/23/13  Yes Deatra Canter, FNP  traMADol (ULTRAM) 50 MG tablet Take 1 tablet (50 mg total) by mouth every 6 (six) hours as needed for pain. 04/14/13  Yes [name], MD  traZODone (DESYREL) 50 MG tablet Take 50 mg by mouth at bedtime.   Yes Historical Provider, MD  oxyCODONE-acetaminophen (PERCOCET/ROXICET) 5-325 MG per tablet Take 1-2 tablets by mouth every 6 (six) hours as needed for pain. 04/19/13   Juliet Rude. Rubin Payor, MD    Allergies as of 05/23/2013  . (No Known Allergies)    Family History  Problem Relation Age of Onset  . Lung cancer    . Heart disease    . Other      not real familiar with family history  . Colon cancer Neg Hx   . Colon polyps Neg Hx     History   Social History  . Marital Status: Married    Spouse Name: rayann Baranski    Number of Children: 0  . Years of Education: N/A   Occupational History  . disabled    Social History Main Topics  . Smoking status: Current Every Day Smoker -- 0.50 packs/day for 30 years    Types: Cigarettes    Start date: 10/09/1981  . Smokeless tobacco: Never Used  . 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 Drug Use: No  . Sexual Activity: Yes   Other Topics Concern  . Not on file   Social History Narrative   Lives w/ wife    Review of Systems: See HPI, otherwise negative ROS  Physical Exam: BP 123/78  Pulse 87  Temp(Src) 97.2 F (36.2 C) (Oral)  Ht 6\' 2"  (1.88 m)  Wt 217 lb 9.6 oz (98.703 kg)  BMI 27.93 kg/m2 General:   Chronically ill appearing gentleman is accompanied by his wife. Skin:  Intact without significant lesions or rashes. Eyes:  Sclera clear, no icterus.   Conjunctiva pink. Ears:  Normal auditory acuity. Nose:  No deformity, discharge,  or lesions. Mouth:  No deformity or lesions. Neck:  Supple; no masses or thyromegaly. No significant cervical adenopathy. Lungs:  Clear throughout to auscultation.   No wheezes, crackles, or rhonchi.  No acute distress. Heart:  Regular rate and rhythm; no murmurs, clicks, rubs,  or gallops. Abdomen: Obese normal bowel sounds.  Positive succussion splash. Soft and nontender without appreciable mass or hepatosplenomegaly.  Pulses:  Normal pulses noted. Extremities:  Without clubbing or edema.  Impression/Plan:  47 year old gentleman with multiple medical problems including diabetic gastroparesis.   Dysphagia improved status post esophageal dilation.  Reflux well-controlled on her Prilosec. Nausea well controlled on when necessary Zofran.   Recommendations:    Continue the current medical regimen. Hold off on Marinol. Literature on gastroparesis diet provided. Recommended good glycemic  control. Surveillance colonoscopy 2015. Office visit with [place] in 3 months.

## 2013-05-27 NOTE — Progress Notes (Signed)
Hypertension adequately controlled. Continue current medication.

## 2013-05-30 ENCOUNTER — Ambulatory Visit: Payer: Self-pay | Admitting: Internal Medicine

## 2013-06-02 ENCOUNTER — Ambulatory Visit: Payer: Medicare Other

## 2013-06-02 ENCOUNTER — Other Ambulatory Visit: Payer: Self-pay | Admitting: Family Medicine

## 2013-06-02 ENCOUNTER — Encounter: Payer: Self-pay | Admitting: Family Medicine

## 2013-06-02 ENCOUNTER — Ambulatory Visit (INDEPENDENT_AMBULATORY_CARE_PROVIDER_SITE_OTHER): Payer: Medicare Other

## 2013-06-02 ENCOUNTER — Ambulatory Visit (INDEPENDENT_AMBULATORY_CARE_PROVIDER_SITE_OTHER): Payer: Medicare Other | Admitting: Family Medicine

## 2013-06-02 VITALS — BP 114/78 | HR 89 | Temp 98.9°F | Ht 74.0 in | Wt 212.4 lb

## 2013-06-02 DIAGNOSIS — R52 Pain, unspecified: Secondary | ICD-10-CM

## 2013-06-02 DIAGNOSIS — M25529 Pain in unspecified elbow: Secondary | ICD-10-CM

## 2013-06-02 DIAGNOSIS — M25522 Pain in left elbow: Secondary | ICD-10-CM

## 2013-06-02 MED ORDER — NAPROXEN 500 MG PO TABS: 500.0000 mg | ORAL_TABLET | Freq: Two times a day (BID) | ORAL | Status: DC

## 2013-06-02 NOTE — Progress Notes (Signed)
  Subjective:    Patient ID: Dustin Cruz, male    DOB: [birth year], 47 y.o.   MRN: [id number]  HPI This 47 y.o. male presents for evaluation of injury to his left elbow.  He is having Some swelling and pain in his left lateral elbow.   Review of Systems No chest pain, SOB, HA, dizziness, vision change, N/V, diarrhea, constipation, dysuria, urinary urgency or frequency, myalgias, arthralgias or rash.     Objective:   Physical Exam Vital signs noted  Well developed well nourished male.  HEENT - Head atraumatic Normocephalic                Throat - oropharanx wnl Respiratory - Lungs CTA bilateral Cardiac - RRR S1 and S2 without murmur GI - Abdomen soft Nontender and bowel sounds active x 4 MS - Swelling and tenderness left lateral elbow and olecranon region, decreased ROM left arm. Neuro - Grossly intact.   Xray of left elbow preliminary read - normal and no fx    Assessment & Plan:  Left elbow pain - Plan: naproxen (NAPROSYN) 500 MG tablet, CANCELED: DG Forearm Left Explained to take naprosyn bid for 10 days and apply ice and follow up prn if not better.

## 2013-06-02 NOTE — Patient Instructions (Addendum)
Place elbow pain patient instructions here.  Take naprosyn for 10 days bid. Ice to left elbow for next day and then apply heat Follow up prn

## 2013-06-20 ENCOUNTER — Encounter: Payer: Self-pay | Admitting: Family Medicine

## 2013-06-20 ENCOUNTER — Ambulatory Visit (INDEPENDENT_AMBULATORY_CARE_PROVIDER_SITE_OTHER): Payer: Medicare Other | Admitting: Family Medicine

## 2013-06-20 VITALS — BP 121/81 | HR 81 | Temp 97.0°F | Ht 74.0 in | Wt 212.0 lb

## 2013-06-20 DIAGNOSIS — E119 Type 2 diabetes mellitus without complications: Secondary | ICD-10-CM

## 2013-06-20 DIAGNOSIS — K219 Gastro-esophageal reflux disease without esophagitis: Secondary | ICD-10-CM

## 2013-06-20 DIAGNOSIS — N400 Enlarged prostate without lower urinary tract symptoms: Secondary | ICD-10-CM

## 2013-06-20 DIAGNOSIS — M7020 Olecranon bursitis, unspecified elbow: Secondary | ICD-10-CM

## 2013-06-20 DIAGNOSIS — M7022 Olecranon bursitis, left elbow: Secondary | ICD-10-CM

## 2013-06-20 MED ORDER — TAMSULOSIN HCL 0.4 MG PO CAPS: 0.4000 mg | ORAL_CAPSULE | Freq: Every day | ORAL | Status: DC

## 2013-06-20 MED ORDER — OMEPRAZOLE 40 MG PO CPDR: 40.0000 mg | DELAYED_RELEASE_CAPSULE | Freq: Two times a day (BID) | ORAL | Status: DC

## 2013-06-20 NOTE — Patient Instructions (Addendum)

## 2013-06-20 NOTE — Progress Notes (Signed)
  Subjective:    Patient ID: Dustin Cruz, male    DOB: 1966/05/23, 47 y.o.   MRN: [id number]  HPI This 47 y.o. male presents for evaluation of follow up visit.  Patient has hx of hypertension Hyperlipidemia, Diabetes, BPH, GERD, FLD, tobacco abuse, and left lateral elbow pain And olecranon bursitis.  He has had an injection of his left elbow and it did not help.  He needs refill on his GERD medicine and bph meds.  He is due for labs.  His last lipid Panel was good.  He is doing fine otherwise..   Review of Systems No chest pain, SOB, HA, dizziness, vision change, N/V, diarrhea, constipation, dysuria, urinary urgency or frequency, myalgias, arthralgias or rash.     Objective:   Physical Exam Vital signs noted  Well developed well nourished male.  HEENT - Head atraumatic Normocephalic                Eyes - PERRLA, Conjuctiva - clear Sclera- Clear EOMI                Ears - EAC's Wnl TM's Wnl Gross Hearing WNL                Nose - Nares patent                 Throat - oropharanx wnl Respiratory - Lungs CTA bilateral Cardiac - RRR S1 and S2 without murmur GI - Abdomen soft Nontender and bowel sounds active x 4 Extremities - No edema. Neuro - Grossly intact. MS - TTP left lateral elbow.    Assessment & Plan:  BPH (benign prostatic hyperplasia) - Plan: tamsulosin (FLOMAX) 0.4 MG CAPS capsule, Check PSA.  Olecranon bursitis, left - Plan: Ambulatory referral to Orthopedic Surgery  Diabetes - Plan: POCT glycosylated hemoglobin (Hb A1C), POCT CBC, CMP14+EGFR, PSA, total and free, Thyroid Panel With TSH, POCT UA - Microalbumin  GERD (gastroesophageal reflux disease) - Plan: omeprazole (PRILOSEC) 40 MG capsule  Follow up in 3 months.

## 2013-07-14 ENCOUNTER — Other Ambulatory Visit: Payer: Self-pay

## 2013-07-15 ENCOUNTER — Other Ambulatory Visit: Payer: Self-pay

## 2013-07-15 DIAGNOSIS — K219 Gastro-esophageal reflux disease without esophagitis: Secondary | ICD-10-CM

## 2013-07-15 MED ORDER — OMEPRAZOLE 40 MG PO CPDR: 40.0000 mg | DELAYED_RELEASE_CAPSULE | Freq: Two times a day (BID) | ORAL | Status: DC

## 2013-07-15 MED ORDER — ONDANSETRON HCL 4 MG PO TABS: 4.0000 mg | ORAL_TABLET | Freq: Three times a day (TID) | ORAL | Status: DC | PRN

## 2013-07-16 ENCOUNTER — Telehealth: Payer: Self-pay | Admitting: Family Medicine

## 2013-07-16 NOTE — Telephone Encounter (Signed)
Pt wants to see ortho for L elbow pain Please advise

## 2013-07-17 ENCOUNTER — Other Ambulatory Visit: Payer: Self-pay | Admitting: Family Medicine

## 2013-07-17 DIAGNOSIS — M25522 Pain in left elbow: Secondary | ICD-10-CM

## 2013-07-17 NOTE — Telephone Encounter (Signed)
Order sent for ortho referral

## 2013-07-24 ENCOUNTER — Ambulatory Visit: Payer: Medicare Other | Admitting: Family Medicine

## 2013-08-09 ENCOUNTER — Encounter (HOSPITAL_COMMUNITY): Payer: Self-pay | Admitting: Emergency Medicine

## 2013-08-09 ENCOUNTER — Emergency Department (HOSPITAL_COMMUNITY)
Admission: EM | Admit: 2013-08-09 | Discharge: 2013-08-09 | Disposition: A | Discharge status: Home / Self Care | Payer: Medicare Other | Attending: Emergency Medicine | Admitting: Emergency Medicine

## 2013-08-09 DIAGNOSIS — K5289 Other specified noninfective gastroenteritis and colitis: Secondary | ICD-10-CM | POA: Insufficient documentation

## 2013-08-09 DIAGNOSIS — Z79899 Other long term (current) drug therapy: Secondary | ICD-10-CM | POA: Insufficient documentation

## 2013-08-09 DIAGNOSIS — I2510 Atherosclerotic heart disease of native coronary artery without angina pectoris: Secondary | ICD-10-CM | POA: Insufficient documentation

## 2013-08-09 DIAGNOSIS — K529 Noninfective gastroenteritis and colitis, unspecified: Secondary | ICD-10-CM

## 2013-08-09 DIAGNOSIS — F411 Generalized anxiety disorder: Secondary | ICD-10-CM | POA: Insufficient documentation

## 2013-08-09 DIAGNOSIS — J4489 Other specified chronic obstructive pulmonary disease: Secondary | ICD-10-CM | POA: Insufficient documentation

## 2013-08-09 DIAGNOSIS — E785 Hyperlipidemia, unspecified: Secondary | ICD-10-CM | POA: Insufficient documentation

## 2013-08-09 DIAGNOSIS — F17200 Nicotine dependence, unspecified, uncomplicated: Secondary | ICD-10-CM | POA: Insufficient documentation

## 2013-08-09 DIAGNOSIS — Z85820 Personal history of malignant melanoma of skin: Secondary | ICD-10-CM | POA: Insufficient documentation

## 2013-08-09 DIAGNOSIS — I252 Old myocardial infarction: Secondary | ICD-10-CM | POA: Insufficient documentation

## 2013-08-09 DIAGNOSIS — Z794 Long term (current) use of insulin: Secondary | ICD-10-CM | POA: Insufficient documentation

## 2013-08-09 DIAGNOSIS — I10 Essential (primary) hypertension: Secondary | ICD-10-CM | POA: Insufficient documentation

## 2013-08-09 DIAGNOSIS — Z9861 Coronary angioplasty status: Secondary | ICD-10-CM | POA: Insufficient documentation

## 2013-08-09 DIAGNOSIS — F3289 Other specified depressive episodes: Secondary | ICD-10-CM | POA: Insufficient documentation

## 2013-08-09 DIAGNOSIS — Z87448 Personal history of other diseases of urinary system: Secondary | ICD-10-CM | POA: Insufficient documentation

## 2013-08-09 DIAGNOSIS — F329 Major depressive disorder, single episode, unspecified: Secondary | ICD-10-CM | POA: Insufficient documentation

## 2013-08-09 DIAGNOSIS — E669 Obesity, unspecified: Secondary | ICD-10-CM | POA: Insufficient documentation

## 2013-08-09 DIAGNOSIS — K219 Gastro-esophageal reflux disease without esophagitis: Secondary | ICD-10-CM | POA: Insufficient documentation

## 2013-08-09 DIAGNOSIS — E119 Type 2 diabetes mellitus without complications: Secondary | ICD-10-CM | POA: Insufficient documentation

## 2013-08-09 DIAGNOSIS — J449 Chronic obstructive pulmonary disease, unspecified: Secondary | ICD-10-CM | POA: Insufficient documentation

## 2013-08-09 HISTORY — DX: Chronic obstructive pulmonary disease, unspecified: J44.9

## 2013-08-09 MED ORDER — SODIUM CHLORIDE 0.9 % IV BOLUS (SEPSIS): 1000.0000 mL | Freq: Once | INTRAVENOUS | Status: DC

## 2013-08-09 MED ORDER — ONDANSETRON HCL 4 MG/2ML IJ SOLN
4.0000 mg | Freq: Once | INTRAMUSCULAR | Status: AC
  Administered 2013-08-09: 4 mg via INTRAVENOUS
  Filled 2013-08-09: qty 2

## 2013-08-09 MED ORDER — MORPHINE SULFATE 4 MG/ML IJ SOLN
4.0000 mg | Freq: Once | INTRAMUSCULAR | Status: AC
  Administered 2013-08-09: 4 mg via INTRAVENOUS
  Filled 2013-08-09: qty 1

## 2013-08-09 MED ORDER — PROMETHAZINE HCL 25 MG PO TABS: 25.0000 mg | ORAL_TABLET | Freq: Four times a day (QID) | ORAL | Status: DC | PRN

## 2013-08-09 MED ORDER — SODIUM CHLORIDE 0.9 % IV BOLUS (SEPSIS)
1000.0000 mL | Freq: Once | INTRAVENOUS | Status: AC
  Administered 2013-08-09: 1000 mL via INTRAVENOUS

## 2013-08-09 NOTE — ED Provider Notes (Signed)
CSN: [id number]     Arrival date & time 08/09/13  4098 History   First MD Initiated Contact with Patient 08/09/13 0335     Chief Complaint  Patient presents with  . Nausea  . Emesis   (Consider location/radiation/quality/duration/timing/severity/associated sxs/prior Treatment) HPI.... nausea and vomiting for 24 hours. No diarrhea, fever, abdominal pain. Nothing makes symptoms better or worse. Severity is mild to moderate. Patient has been urinating.  Past Medical History  Diagnosis Date  . Diabetes mellitus     Type 2  . Hyperlipidemia   . Depression   . Coronary artery disease     s/p BMS to Ramus 10/2010;  Cath 01/22/12 prox 30-40% LAD, LCx ramus w/ hazy 70-80% in-stent restenosis, EF 60% treated medically  . HTN (hypertension)   . Tobacco abuse   . Obesity   . Anxiety   . Melanoma 2007    surgery at [facility], Followed by Neijstrom  . Myocardial infarction 2012  . GERD (gastroesophageal reflux disease)   . Hyperlipidemia   . Asthma   . Coronary artery disease     s/p BMS to Ramus 10/2010;  Cath 01/22/12 prox 30-40% LAD, LCx ramus w/ hazy 70-80% in-stent restenosis, EF 60% treated medically   . Major depression, chronic 08/13/2012  . Gastroparesis 05/2013  . Tubular adenoma   . COPD (chronic obstructive pulmonary disease)    Past Surgical History  Procedure Laterality Date  . Melanoma surgery  2007    [facility], removed lymph nodes under arm as well,Left abd  . Cardiac catheterization      with stent  . Esophagogastroduodenoscopy  04/24/2012    Rourk-mild erosive reflux esophagitis,dilated w/58F Elease Hashimoto, small HH, minimal chronic gastric/bulbar erosions(No H pylori)  . Colonoscopy N/A 01/27/2013    JXB:[id number] and colonic polyps. Tubular adenomas   Family History  Problem Relation Age of Onset  . Lung cancer    . Heart disease    . Other      not real familiar with family history  . Colon cancer Neg Hx   . Colon polyps Neg Hx    History  Substance Use Topics  . Smoking  status: Current Every Day Smoker -- 0.50 packs/day for 30 years    Types: Cigarettes    Start date: 10/09/1981  . Smokeless tobacco: Never Used  . 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    Allergies  Review of patient's allergies indicates no known allergies.  Home Medications   Current Outpatient Rx  Name  Route  Sig  Dispense  Refill  . albuterol (PROVENTIL HFA;VENTOLIN HFA) 108 (90 BASE) MCG/ACT inhaler   Inhalation   Inhale 2 puffs into the lungs every 6 (six) hours as needed for wheezing.   1 Inhaler   2   . albuterol (PROVENTIL) (2.5 MG/3ML) 0.083% nebulizer solution   Nebulization   Take 3 mLs (2.5 mg total) by nebulization 2 (two) times daily as needed for wheezing or shortness of breath.   75 mL   3   . atorvastatin (LIPITOR) 80 MG tablet   Oral   Take 1 tablet (80 mg total) by mouth daily.   90 tablet   3   . gabapentin (NEURONTIN) 400 MG capsule   Oral   Take 400 mg by mouth 4 (four) times daily.         . Insulin Glargine (LANTUS  SOLOSTAR) 100 UNIT/ML SOPN   Subcutaneous   Inject 42 Units into the skin at bedtime.         [name] lisinopril (PRINIVIL,ZESTRIL) 20 MG tablet   Oral   Take 1 tablet (20 mg total) by mouth daily.   90 tablet   3   . metFORMIN (GLUCOPHAGE) 1000 MG tablet   Oral   Take 1 tablet (1,000 mg total) by mouth 2 (two) times daily with a meal.   180 tablet   3   . metoprolol succinate (TOPROL-XL) 25 MG 24 hr tablet   Oral   Take 1 tablet (25 mg total) by mouth daily.   30 tablet   6   . metroNIDAZOLE (FLAGYL) 500 MG tablet   Oral   Take 1 tablet (500 mg total) by mouth 3 (three) times daily.   30 tablet   0   . mometasone-formoterol (DULERA) 100-5 MCG/ACT AERO   Inhalation   Inhale 2 puffs into the lungs 2 (two) times daily.   1 Inhaler   0   . nitroGLYCERIN (NITROSTAT) 0.4 MG SL tablet   Sublingual   Place 1 tablet  (0.4 mg total) under the tongue every 5 (five) minutes as needed. For chest pain.   25 tablet   3   . omeprazole (PRILOSEC) 40 MG capsule   Oral   Take 1 capsule (40 mg total) by mouth 2 (two) times daily.   60 capsule   5   . ondansetron (ZOFRAN) 4 MG tablet   Oral   Take 1 tablet (4 mg total) by mouth every 8 (eight) hours as needed for nausea.   30 tablet   3   . oxyCODONE-acetaminophen (PERCOCET/ROXICET) 5-325 MG per tablet   Oral   Take 1-2 tablets by mouth every 6 (six) hours as needed for pain.   10 tablet   0   . promethazine (PHENERGAN) 25 MG tablet   Oral   Take 1 tablet (25 mg total) by mouth every 6 (six) hours as needed for nausea.   20 tablet   0   . sertraline (ZOLOFT) 50 MG tablet   Oral   Take 1 tablet (50 mg total) by mouth daily.   30 tablet   0   . tamsulosin (FLOMAX) 0.4 MG CAPS capsule   Oral   Take 1 capsule (0.4 mg total) by mouth daily.   30 capsule   3   . traMADol (ULTRAM) 50 MG tablet   Oral   Take 1 tablet (50 mg total) by mouth every 6 (six) hours as needed for pain.   30 tablet   0   . traZODone (DESYREL) 50 MG tablet   Oral   Take 50 mg by mouth at bedtime.          BP 120/78  Pulse 98  Temp(Src) 98.4 F (36.9 C) (Oral)  Resp 20  Ht 6\' 2"  (1.88 m)  Wt 207 lb (93.895 kg)  BMI 26.57 kg/m2  SpO2 98% Physical Exam  Nursing note and vitals reviewed. Constitutional: He is oriented to person, place, and time. He appears well-developed and well-nourished.  HENT:  Head: Normocephalic and atraumatic.  Eyes: Conjunctivae and EOM are normal. Pupils are equal, round, and reactive to light.  Neck: Normal range of motion. Neck supple.  Cardiovascular: Normal rate, regular rhythm and normal heart sounds.   Pulmonary/Chest: Effort normal and breath sounds normal.  Abdominal: Soft. Bowel sounds are normal.  Nontender abdomen  Musculoskeletal:  Normal range of motion.  Neurological: He is alert and oriented to person, place, and  time.  Skin: Skin is warm and dry.  Psychiatric: He has a normal mood and affect.    ED Course  Procedures (including critical care time) Labs Review Labs Reviewed - No data to display Imaging Review No results found.  EKG Interpretation   None       MDM   1. Gastroenteritis    History and physical consistent with gastroenteritis. Patient feels better after IV fluids.  Discharge medications Phenergan 25 mg    Donnetta Hutching, MD 08/09/13 [phone]

## 2013-08-09 NOTE — ED Notes (Signed)
When entering the room patient on stretcher snoring. Called name to arouse patient to take vitals.

## 2013-08-14 ENCOUNTER — Ambulatory Visit: Payer: Self-pay | Admitting: Gastroenterology

## 2013-08-21 ENCOUNTER — Emergency Department (HOSPITAL_COMMUNITY)
Admission: EM | Admit: 2013-08-21 | Discharge: 2013-08-21 | Disposition: A | Discharge status: Home / Self Care | Payer: Medicare Other | Attending: Emergency Medicine | Admitting: Emergency Medicine

## 2013-08-21 ENCOUNTER — Encounter (HOSPITAL_COMMUNITY): Payer: Self-pay | Admitting: Emergency Medicine

## 2013-08-21 ENCOUNTER — Emergency Department (HOSPITAL_COMMUNITY): Payer: Medicare Other

## 2013-08-21 DIAGNOSIS — I10 Essential (primary) hypertension: Secondary | ICD-10-CM | POA: Insufficient documentation

## 2013-08-21 DIAGNOSIS — E119 Type 2 diabetes mellitus without complications: Secondary | ICD-10-CM | POA: Insufficient documentation

## 2013-08-21 DIAGNOSIS — I252 Old myocardial infarction: Secondary | ICD-10-CM | POA: Insufficient documentation

## 2013-08-21 DIAGNOSIS — K219 Gastro-esophageal reflux disease without esophagitis: Secondary | ICD-10-CM | POA: Insufficient documentation

## 2013-08-21 DIAGNOSIS — F329 Major depressive disorder, single episode, unspecified: Secondary | ICD-10-CM | POA: Insufficient documentation

## 2013-08-21 DIAGNOSIS — R109 Unspecified abdominal pain: Secondary | ICD-10-CM

## 2013-08-21 DIAGNOSIS — E669 Obesity, unspecified: Secondary | ICD-10-CM | POA: Insufficient documentation

## 2013-08-21 DIAGNOSIS — F411 Generalized anxiety disorder: Secondary | ICD-10-CM | POA: Insufficient documentation

## 2013-08-21 DIAGNOSIS — I2510 Atherosclerotic heart disease of native coronary artery without angina pectoris: Secondary | ICD-10-CM | POA: Insufficient documentation

## 2013-08-21 DIAGNOSIS — Z794 Long term (current) use of insulin: Secondary | ICD-10-CM | POA: Insufficient documentation

## 2013-08-21 DIAGNOSIS — Z85820 Personal history of malignant melanoma of skin: Secondary | ICD-10-CM | POA: Insufficient documentation

## 2013-08-21 DIAGNOSIS — E785 Hyperlipidemia, unspecified: Secondary | ICD-10-CM | POA: Insufficient documentation

## 2013-08-21 DIAGNOSIS — IMO0002 Reserved for concepts with insufficient information to code with codable children: Secondary | ICD-10-CM | POA: Insufficient documentation

## 2013-08-21 DIAGNOSIS — Z7982 Long term (current) use of aspirin: Secondary | ICD-10-CM | POA: Insufficient documentation

## 2013-08-21 DIAGNOSIS — Z79899 Other long term (current) drug therapy: Secondary | ICD-10-CM | POA: Insufficient documentation

## 2013-08-21 DIAGNOSIS — J449 Chronic obstructive pulmonary disease, unspecified: Secondary | ICD-10-CM | POA: Insufficient documentation

## 2013-08-21 DIAGNOSIS — F17200 Nicotine dependence, unspecified, uncomplicated: Secondary | ICD-10-CM | POA: Insufficient documentation

## 2013-08-21 DIAGNOSIS — Z9861 Coronary angioplasty status: Secondary | ICD-10-CM | POA: Insufficient documentation

## 2013-08-21 DIAGNOSIS — J4489 Other specified chronic obstructive pulmonary disease: Secondary | ICD-10-CM | POA: Insufficient documentation

## 2013-08-21 DIAGNOSIS — R112 Nausea with vomiting, unspecified: Secondary | ICD-10-CM | POA: Insufficient documentation

## 2013-08-21 DIAGNOSIS — R1011 Right upper quadrant pain: Secondary | ICD-10-CM | POA: Insufficient documentation

## 2013-08-21 LAB — COMPREHENSIVE METABOLIC PANEL
BUN: 17 mg/dL (ref 6–23)
CO2: 24 mEq/L (ref 19–32)
Calcium: 9 mg/dL (ref 8.4–10.5)
Chloride: 95 mEq/L — ABNORMAL LOW (ref 96–112)
Creatinine, Ser: 0.89 mg/dL (ref 0.50–1.35)
GFR calc Af Amer: >90 mL/min (ref >90)
GFR calc non Af Amer: >90 mL/min (ref >90)
Glucose, Bld: 283 mg/dL — ABNORMAL HIGH (ref 70–99)
Sodium: 133 mEq/L — ABNORMAL LOW (ref 135–145)

## 2013-08-21 LAB — CBC WITH DIFFERENTIAL/PLATELET
Basophils Absolute: 0 10*3/uL (ref 0.0–0.1)
Eosinophils Absolute: 0 10*3/uL (ref 0.0–0.7)
Eosinophils Relative: 0 % (ref 0–5)
HCT: 44.5 % (ref 39.0–52.0)
Lymphocytes Relative: 23 % (ref 12–46)
Lymphs Abs: 1.9 10*3/uL (ref 0.7–4.0)
MCV: 92.1 fL (ref 78.0–100.0)
Monocytes Absolute: 0.7 10*3/uL (ref 0.1–1.0)
Monocytes Relative: 8 % (ref 3–12)
RBC: 4.83 MIL/uL (ref 4.22–5.81)
RDW: 12.4 % (ref 11.5–15.5)
WBC: 8.5 10*3/uL (ref 4.0–10.5)

## 2013-08-21 LAB — LIPASE, BLOOD: Lipase: 14 U/L (ref 11–59)

## 2013-08-21 MED ORDER — SODIUM CHLORIDE 0.9 % IV BOLUS (SEPSIS)
1000.0000 mL | Freq: Once | INTRAVENOUS | Status: AC
  Administered 2013-08-21: 1000 mL via INTRAVENOUS

## 2013-08-21 MED ORDER — HYDROCODONE-ACETAMINOPHEN 5-325 MG PO TABS: 1.0000 | ORAL_TABLET | Freq: Four times a day (QID) | ORAL | Status: DC | PRN

## 2013-08-21 MED ORDER — PANTOPRAZOLE SODIUM 40 MG IV SOLR
40.0000 mg | Freq: Once | INTRAVENOUS | Status: AC
  Administered 2013-08-21: 40 mg via INTRAVENOUS
  Filled 2013-08-21: qty 40

## 2013-08-21 MED ORDER — PANTOPRAZOLE SODIUM 20 MG PO TBEC: 20.0000 mg | DELAYED_RELEASE_TABLET | Freq: Every day | ORAL | Status: DC

## 2013-08-21 MED ORDER — ONDANSETRON HCL 4 MG/2ML IJ SOLN
4.0000 mg | Freq: Once | INTRAMUSCULAR | Status: AC
  Administered 2013-08-21: 4 mg via INTRAVENOUS
  Filled 2013-08-21: qty 2

## 2013-08-21 MED ORDER — PROMETHAZINE HCL 25 MG PO TABS: 25.0000 mg | ORAL_TABLET | Freq: Four times a day (QID) | ORAL | Status: DC | PRN

## 2013-08-21 NOTE — ED Provider Notes (Signed)
CSN: [id number]     Arrival date & time 08/21/13  1700 History  This chart was scribed for [name], MD by Valera Castle, ED Scribe. This patient was seen in room APA10/APA10 and the patient's care was started at 5:15 PM.   Chief Complaint  Patient presents with  . Abdominal Pain    Patient is a 47 y.o. male presenting with abdominal pain. The history is provided by the patient. No language interpreter was used.  Abdominal Pain Pain location:  RUQ Pain severity:  Moderate Onset quality:  Sudden Duration: Earlier today. Timing:  Constant Progression:  Unchanged Chronicity:  New Context: previous surgery (melenoma surgery, under arm and left abdomen)   Associated symptoms: nausea and vomiting   Associated symptoms: no chest pain, no cough, no diarrhea, no fatigue and no hematuria     HPI Comments: Dustin Cruz is a 47 y.o. male who presents to the Emergency Department complaining of sudden, moderate, constant, RUQ abdominal pain, onset earlier today. He reports associated nausea and emesis, productive of blood spots, intermittently throughout the day. He reports h/o melenoma surgery where they took out his lymph nodes under his arm and left abdomen in 2006. He reports he still has his gallbladder. He denies diarrhea, and any other associated symptoms.   PCP -Redmond Baseman, MD   Past Medical History  Diagnosis Date  . Diabetes mellitus     Type 2  . Hyperlipidemia   . Depression   . Coronary artery disease     s/p BMS to Ramus 10/2010;  Cath 01/22/12 prox 30-40% LAD, LCx ramus w/ hazy 70-80% in-stent restenosis, EF 60% treated medically  . HTN (hypertension)   . Tobacco abuse   . Obesity   . Anxiety   . Melanoma 2007    surgery at [facility], Followed by Neijstrom  . Myocardial infarction 2012  . GERD (gastroesophageal reflux disease)   . Hyperlipidemia   . Asthma   . Coronary artery disease     s/p BMS to Ramus 10/2010;  Cath 01/22/12 prox 30-40% LAD, LCx ramus w/  hazy 70-80% in-stent restenosis, EF 60% treated medically   . Major depression, chronic 08/13/2012  . Gastroparesis 05/2013  . Tubular adenoma   . COPD (chronic obstructive pulmonary disease)    Past Surgical History  Procedure Laterality Date  . Melanoma surgery  2007    [facility], removed lymph nodes under arm as well,Left abd  . Cardiac catheterization      with stent  . Esophagogastroduodenoscopy  04/24/2012    Rourk-mild erosive reflux esophagitis,dilated w/47F Elease Hashimoto, small HH, minimal chronic gastric/bulbar erosions(No H pylori)  . Colonoscopy N/A 01/27/2013    [id number]:[id number] and colonic polyps. Tubular adenomas   Family History  Problem Relation Age of Onset  . Lung cancer    . Heart disease    . Other      not real familiar with family history  . Colon cancer Neg Hx   . Colon polyps Neg Hx    History  Substance Use Topics  . Smoking status: Current Every Day Smoker -- 0.50 packs/day for 30 years    Types: Cigarettes    Start date: 10/09/1981  . Smokeless tobacco: Never Used  . 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     Results for orders placed during the hospital encounter of 08/21/13  GLUCOSE, CAPILLARY      Result Value Range   Glucose-Capillary 263 (*) 70 -  99 mg/dL  CBC WITH DIFFERENTIAL      Result Value Range   WBC 8.5  4.0 - 10.5 K/uL   RBC 4.83  4.22 - 5.81 MIL/uL   Hemoglobin 15.6  13.0 - 17.0 g/dL   HCT 16.1  09.6 - 04.5 %   MCV 92.1  78.0 - 100.0 fL   MCH 32.3  26.0 - 34.0 pg   MCHC 35.1  30.0 - 36.0 g/dL   RDW 40.9  81.1 - 91.4 %   Platelets 239  150 - 400 K/uL   Neutrophils Relative % 69  43 - 77 %   Neutro Abs 5.9  1.7 - 7.7 K/uL   Lymphocytes Relative 23  12 - 46 %   Lymphs Abs 1.9  0.7 - 4.0 K/uL   Monocytes Relative 8  3 - 12 %   Monocytes Absolute 0.7  0.1 - 1.0 K/uL   Eosinophils Relative 0  0 - 5 %   Eosinophils Absolute 0.0  0.0 - 0.7 K/uL   Basophils Relative 0  0 - 1 %   Basophils Absolute 0.0  0.0 - 0.1  K/uL  COMPREHENSIVE METABOLIC PANEL      Result Value Range   Sodium 133 (*) 135 - 145 mEq/L   Potassium 4.3  3.5 - 5.1 mEq/L   Chloride 95 (*) 96 - 112 mEq/L   CO2 24  19 - 32 mEq/L   Glucose, Bld 283 (*) 70 - 99 mg/dL   BUN 17  6 - 23 mg/dL   Creatinine, Ser 7.82  0.50 - 1.35 mg/dL   Calcium 9.0  8.4 - 95.6 mg/dL   Total Protein 6.6  6.0 - 8.3 g/dL   Albumin 3.6  3.5 - 5.2 g/dL   AST 10  0 - 37 U/L   ALT 17  0 - 53 U/L   Alkaline Phosphatase 61  39 - 117 U/L   Total Bilirubin 0.5  0.3 - 1.2 mg/dL   GFR calc non Af Amer >90  >90 mL/min   GFR calc Af Amer >90  >90 mL/min  LIPASE, BLOOD      Result Value Range   Lipase 14  11 - 59 U/L    Review of Systems  Constitutional: Negative for appetite change and fatigue.  HENT: Negative for congestion, ear discharge and sinus pressure.   Eyes: Negative for discharge.  Respiratory: Negative for cough.   Cardiovascular: Negative for chest pain.  Gastrointestinal: Positive for nausea, vomiting and abdominal pain (RUQ). Negative for diarrhea.  Genitourinary: Negative for frequency and hematuria.  Musculoskeletal: Negative for back pain.  Skin: Negative for rash.  Neurological: Negative for seizures and headaches.  Psychiatric/Behavioral: Negative for hallucinations.  All other systems reviewed and are negative.   Allergies  Review of patient's allergies indicates no known allergies.  Home Medications   Current Outpatient Rx  Name  Route  Sig  Dispense  Refill  . aspirin EC 81 MG tablet   Oral   Take 81 mg by mouth daily.         [name] atorvastatin (LIPITOR) 80 MG tablet   Oral   Take 1 tablet (80 mg total) by mouth daily.   90 tablet   3   . gabapentin (NEURONTIN) 400 MG capsule   Oral   Take 400 mg by mouth 3 (three) times daily.          . Insulin Glargine (LANTUS SOLOSTAR) 100 UNIT/ML SOPN   Subcutaneous  Inject 14 Units into the skin at bedtime.         [name] lisinopril (PRINIVIL,ZESTRIL) 20 MG tablet   Oral    Take 1 tablet (20 mg total) by mouth daily.   90 tablet   3   . metFORMIN (GLUCOPHAGE) 1000 MG tablet   Oral   Take 1 tablet (1,000 mg total) by mouth 2 (two) times daily with a meal.   180 tablet   3   . metoprolol succinate (TOPROL-XL) 25 MG 24 hr tablet   Oral   Take 1 tablet (25 mg total) by mouth daily.   30 tablet   6   . omeprazole (PRILOSEC) 40 MG capsule   Oral   Take 1 capsule (40 mg total) by mouth 2 (two) times daily.   60 capsule   5   . sertraline (ZOLOFT) 100 MG tablet   Oral   Take 100 mg by mouth daily.         . tamsulosin (FLOMAX) 0.4 MG CAPS capsule   Oral   Take 1 capsule (0.4 mg total) by mouth daily.   30 capsule   3   . traZODone (DESYREL) 100 MG tablet   Oral   Take 100 mg by mouth at bedtime.         [name] albuterol (PROVENTIL HFA;VENTOLIN HFA) 108 (90 BASE) MCG/ACT inhaler   Inhalation   Inhale 2 puffs into the lungs every 6 (six) hours as needed for wheezing.   1 Inhaler   2   . albuterol (PROVENTIL) (2.5 MG/3ML) 0.083% nebulizer solution   Nebulization   Take 3 mLs (2.5 mg total) by nebulization 2 (two) times daily as needed for wheezing or shortness of breath.   75 mL   3   . Insulin Glargine (LANTUS SOLOSTAR) 100 UNIT/ML SOPN   Subcutaneous   Inject 42 Units into the skin at bedtime.         . metroNIDAZOLE (FLAGYL) 500 MG tablet   Oral   Take 1 tablet (500 mg total) by mouth 3 (three) times daily.   30 tablet   0   . mometasone-formoterol (DULERA) 100-5 MCG/ACT AERO   Inhalation   Inhale 2 puffs into the lungs 2 (two) times daily.   1 Inhaler   0   . nitroGLYCERIN (NITROSTAT) 0.4 MG SL tablet   Sublingual   Place 1 tablet (0.4 mg total) under the tongue every 5 (five) minutes as needed. For chest pain.   25 tablet   3   . ondansetron (ZOFRAN) 4 MG tablet   Oral   Take 1 tablet (4 mg total) by mouth every 8 (eight) hours as needed for nausea.   30 tablet   3   . oxyCODONE-acetaminophen (PERCOCET/ROXICET)  5-325 MG per tablet   Oral   Take 1-2 tablets by mouth every 6 (six) hours as needed for pain.   10 tablet   0   . promethazine (PHENERGAN) 25 MG tablet   Oral   Take 1 tablet (25 mg total) by mouth every 6 (six) hours as needed for nausea.   20 tablet   0   . traMADol (ULTRAM) 50 MG tablet   Oral   Take 1 tablet (50 mg total) by mouth every 6 (six) hours as needed for pain.   30 tablet   0    Triage Vitals: BP 123/72  Pulse 99  Temp(Src) 98 F (36.7 C) (Oral)  Resp 18  SpO2 96%  Physical Exam  Constitutional: He is oriented to person, place, and time. He appears well-developed.  HENT:  Head: Normocephalic.  Eyes: Conjunctivae and EOM are normal. No scleral icterus.  Neck: Neck supple. No thyromegaly present.  Cardiovascular: Normal rate and regular rhythm.  Exam reveals no gallop and no friction rub.   No murmur heard. Pulmonary/Chest: No stridor. He has no wheezes. He has no rales. He exhibits no tenderness.  Abdominal: Soft. Bowel sounds are normal. He exhibits no distension. There is tenderness. There is no rebound.  Mild RUQ tenderness to palpation.  Musculoskeletal: Normal range of motion. He exhibits no edema.  Lymphadenopathy:    He has no cervical adenopathy.  Neurological: He is oriented to person, place, and time. He exhibits normal muscle tone. Coordination normal.  Skin: No rash noted. No erythema.  Psychiatric: He has a normal mood and affect. His behavior is normal.    ED Course  Procedures (including critical care time)  DIAGNOSTIC STUDIES: Oxygen Saturation is 96% on room air, normal by my interpretation.    COORDINATION OF CARE: 5:25 PM-Discussed treatment plan which includes abdomen X-ray, abdomen US, CBC, CMP, Lipase, and Glucose with pt at bedside and pt agreed to plan.   Labs Review Labs Reviewed  GLUCOSE, CAPILLARY - Abnormal; Notable for the following:    Glucose-Capillary 263 (*)    All other components within normal limits   COMPREHENSIVE METABOLIC PANEL - Abnormal; Notable for the following:    Sodium 133 (*)    Chloride 95 (*)    Glucose, Bld 283 (*)    All other components within normal limits  CBC WITH DIFFERENTIAL  LIPASE, BLOOD   Imaging Review Dg Abd Acute W/chest  08/21/2013   CLINICAL DATA:  47 year old male with right abdominal pain.  EXAM: ACUTE ABDOMEN SERIES (ABDOMEN 2 VIEW & CHEST 1 VIEW)  COMPARISON:  11/24/2012 chest radiograph and 08/04/2012 abdominal radiograph  FINDINGS: The cardiomediastinal silhouette is unremarkable.  There is no evidence of focal airspace disease, pulmonary edema, suspicious pulmonary nodule/mass, pleural effusion, or pneumothorax.  Nondistended gas-filled loops of small bowel and colon noted. Gas and stool in the colon and rectum are present.  There is no evidence of dilated bowel loops or pneumoperitoneum.  No suspicious calcifications are present.  IMPRESSION: Nonspecific nonobstructive bowel gas pattern - no evidence of pneumoperitoneum.  No evidence of acute cardiopulmonary disease.   Electronically Signed   By: Laveda Abbe M.D.   On: 08/21/2013 18:15    EKG Interpretation   None      Meds ordered this encounter  Medications  . Insulin Glargine (LANTUS SOLOSTAR) 100 UNIT/ML SOPN    Sig: Inject 14 Units into the skin at bedtime.  . pantoprazole (PROTONIX) injection 40 mg    Sig:   . ondansetron (ZOFRAN) injection 4 mg    Sig:   . sodium chloride 0.9 % bolus 1,000 mL    Sig:   . traZODone (DESYREL) 100 MG tablet    Sig: Take 100 mg by mouth at bedtime.  . sertraline (ZOLOFT) 100 MG tablet    Sig: Take 100 mg by mouth daily.  [name] aspirin EC 81 MG tablet    Sig: Take 81 mg by mouth daily.    MDM  No diagnosis found.      The chart was scribed for me under my direct supervision.  I personally performed the history, physical, and medical decision making and all procedures in the evaluation of this patient.[name]  [name], MD 08/21/13 [phone]

## 2013-08-21 NOTE — ED Notes (Signed)
Epigastric pain with n/v starting today.  Has not had insulin since nights ago.  Denies diarrhea.

## 2013-09-18 ENCOUNTER — Ambulatory Visit: Payer: Self-pay | Admitting: Cardiovascular Disease

## 2013-09-19 ENCOUNTER — Ambulatory Visit: Payer: Medicare Other | Admitting: Family Medicine

## 2013-09-24 ENCOUNTER — Encounter: Payer: Self-pay | Admitting: Family Medicine

## 2013-09-24 ENCOUNTER — Ambulatory Visit (INDEPENDENT_AMBULATORY_CARE_PROVIDER_SITE_OTHER): Payer: Medicare Other | Admitting: Family Medicine

## 2013-09-24 VITALS — BP 125/83 | HR 92 | Temp 98.6°F | Ht 74.0 in | Wt 203.0 lb

## 2013-09-24 DIAGNOSIS — Z23 Encounter for immunization: Secondary | ICD-10-CM

## 2013-09-24 DIAGNOSIS — E119 Type 2 diabetes mellitus without complications: Secondary | ICD-10-CM

## 2013-09-24 DIAGNOSIS — N400 Enlarged prostate without lower urinary tract symptoms: Secondary | ICD-10-CM

## 2013-09-24 DIAGNOSIS — K219 Gastro-esophageal reflux disease without esophagitis: Secondary | ICD-10-CM

## 2013-09-24 DIAGNOSIS — G629 Polyneuropathy, unspecified: Secondary | ICD-10-CM

## 2013-09-24 DIAGNOSIS — G589 Mononeuropathy, unspecified: Secondary | ICD-10-CM

## 2013-09-24 MED ORDER — TAMSULOSIN HCL 0.4 MG PO CAPS: 0.4000 mg | ORAL_CAPSULE | Freq: Every day | ORAL | Status: DC

## 2013-09-24 MED ORDER — INSULIN GLARGINE 100 UNIT/ML SOLOSTAR PEN: 14.0000 [IU] | PEN_INJECTOR | Freq: Every day | SUBCUTANEOUS | Status: DC

## 2013-09-24 MED ORDER — HYDROCODONE-ACETAMINOPHEN 5-325 MG PO TABS: 1.0000 | ORAL_TABLET | Freq: Four times a day (QID) | ORAL | Status: DC | PRN

## 2013-09-24 MED ORDER — ONDANSETRON HCL 4 MG PO TABS: 4.0000 mg | ORAL_TABLET | Freq: Three times a day (TID) | ORAL | Status: DC | PRN

## 2013-09-24 MED ORDER — METFORMIN HCL 1000 MG PO TABS: 1000.0000 mg | ORAL_TABLET | Freq: Two times a day (BID) | ORAL | Status: DC

## 2013-09-24 MED ORDER — TRAZODONE HCL 100 MG PO TABS: 100.0000 mg | ORAL_TABLET | Freq: Every day | ORAL | Status: DC

## 2013-09-24 MED ORDER — GABAPENTIN 400 MG PO CAPS: 400.0000 mg | ORAL_CAPSULE | Freq: Three times a day (TID) | ORAL | Status: DC

## 2013-09-24 MED ORDER — OMEPRAZOLE 40 MG PO CPDR: 40.0000 mg | DELAYED_RELEASE_CAPSULE | Freq: Two times a day (BID) | ORAL | Status: DC

## 2013-09-24 NOTE — Progress Notes (Signed)
   Subjective:    Patient ID: Dustin Cruz, male    DOB: 02-18-1966, 47 y.o.   MRN: [id number]  HPI  This 47 y.o. male presents for evaluation of follow up visit.  He has hx of diabetes, hypertension, Gerd, and insomnia.  Review of Systems No chest pain, SOB, HA, dizziness, vision change, N/V, diarrhea, constipation, dysuria, urinary urgency or frequency, myalgias, arthralgias or rash.     Objective:   Physical Exam  Vital signs noted  Well developed well nourished male.  HEENT - Head atraumatic Normocephalic                Eyes - PERRLA, Conjuctiva - clear Sclera- Clear EOMI                Ears - EAC's Wnl TM's Wnl Gross Hearing WNL                 Throat - oropharanx wnl Respiratory - Lungs CTA bilateral Cardiac - RRR S1 and S2 without murmur GI - Abdomen soft Nontender and bowel sounds active x 4 Extremities - No edema. Neuro - Grossly intact.      Assessment & Plan:  Diabetes - Plan: metFORMIN (GLUCOPHAGE) 1000 MG tablet, Insulin Glargine (LANTUS SOLOSTAR) 100 UNIT/ML SOPN, POCT CBC, POCT glycosylated hemoglobin (Hb A1C), CMP14+EGFR  BPH (benign prostatic hyperplasia) - Plan: tamsulosin (FLOMAX) 0.4 MG CAPS capsule  GERD (gastroesophageal reflux disease) - Plan: ondansetron (ZOFRAN) 4 MG tablet, omeprazole (PRILOSEC) 40 MG capsule  Neuropathy - Plan: HYDROcodone-acetaminophen (NORCO/VICODIN) 5-325 MG per tablet, gabapentin (NEURONTIN) 400 MG capsule  Deatra Canter FNP

## 2013-09-24 NOTE — Patient Instructions (Signed)

## 2013-10-14 ENCOUNTER — Encounter: Payer: Self-pay | Admitting: Cardiovascular Disease

## 2013-10-14 ENCOUNTER — Ambulatory Visit (INDEPENDENT_AMBULATORY_CARE_PROVIDER_SITE_OTHER): Payer: Medicare Other | Admitting: Cardiovascular Disease

## 2013-10-14 VITALS — BP 129/78 | HR 100 | Ht 74.0 in | Wt 203.0 lb

## 2013-10-14 DIAGNOSIS — I7090 Unspecified atherosclerosis: Secondary | ICD-10-CM

## 2013-10-14 DIAGNOSIS — I10 Essential (primary) hypertension: Secondary | ICD-10-CM

## 2013-10-14 DIAGNOSIS — I2510 Atherosclerotic heart disease of native coronary artery without angina pectoris: Secondary | ICD-10-CM | POA: Diagnosis not present

## 2013-10-14 DIAGNOSIS — F17200 Nicotine dependence, unspecified, uncomplicated: Secondary | ICD-10-CM

## 2013-10-14 DIAGNOSIS — E7800 Pure hypercholesterolemia, unspecified: Secondary | ICD-10-CM | POA: Diagnosis not present

## 2013-10-14 DIAGNOSIS — Z720 Tobacco use: Secondary | ICD-10-CM

## 2013-10-14 NOTE — Patient Instructions (Addendum)
Your physician recommends that you schedule a follow-up appointment in: 6 months  Your physician recommends that you continue on your current medications as directed. Please refer to the Current Medication list given to you today.  

## 2013-10-14 NOTE — Progress Notes (Signed)
Patient ID: Dustin Cruz, male   DOB: 04/26/1966, 48 y.o.   MRN: [id number]      SUBJECTIVE: The patient is a 48 yr old male with known coronary artery disease. He was found to have critical stenosis of a small ramus in 2011 and underwent bare metal stent placement. Repeat catheterization in April 2013 showed in-stent stenosis of 70-80%. Medical therapy was advised that due to the small size of the vessel. He also has HTN, diabetes mellitus, GERD s/p esophageal dilatation, and hyperlipidemia. He denies chest pain, shortness of breath, palpitations and dizziness. He smokes a half Paddack to one Ruppe of cigarettes daily. He said he is trying to cut back. He said he has lost a significant amount of weight by walking a lot.    No Known Allergies  Current Outpatient Prescriptions  Medication Sig Dispense Refill  . albuterol (PROVENTIL HFA;VENTOLIN HFA) 108 (90 BASE) MCG/ACT inhaler Inhale 2 puffs into the lungs every 6 (six) hours as needed for wheezing.  1 Inhaler  2  . aspirin EC 81 MG tablet Take 81 mg by mouth daily.      [name]. atorvastatin (LIPITOR) 80 MG tablet Take 1 tablet (80 mg total) by mouth daily.  90 tablet  3  . gabapentin (NEURONTIN) 400 MG capsule Take 1 capsule (400 mg total) by mouth 3 (three) times daily.  90 capsule  3  . HYDROcodone-acetaminophen (NORCO/VICODIN) 5-325 MG per tablet Take 1 tablet by mouth every 6 (six) hours as needed for moderate pain.  20 tablet  0  . Insulin Glargine (LANTUS SOLOSTAR) 100 UNIT/ML SOPN Inject 14 Units into the skin at bedtime.  1 pen  3  . lisinopril (PRINIVIL,ZESTRIL) 20 MG tablet Take 1 tablet (20 mg total) by mouth daily.  90 tablet  3  . metFORMIN (GLUCOPHAGE) 1000 MG tablet Take 1 tablet (1,000 mg total) by mouth 2 (two) times daily with a meal.  180 tablet  3  . metoprolol succinate (TOPROL-XL) 25 MG 24 hr tablet Take 1 tablet (25 mg total) by mouth daily.  30 tablet  6  . nitroGLYCERIN (NITROSTAT) 0.4 MG SL tablet Place 1 tablet (0.4 mg total)  under the tongue every 5 (five) minutes as needed. For chest pain.  25 tablet  3  . omeprazole (PRILOSEC) 40 MG capsule Take 1 capsule (40 mg total) by mouth 2 (two) times daily.  60 capsule  5  . ondansetron (ZOFRAN) 4 MG tablet Take 1 tablet (4 mg total) by mouth every 8 (eight) hours as needed for nausea.  30 tablet  3  . pantoprazole (PROTONIX) 20 MG tablet Take 1 tablet (20 mg total) by mouth daily.  20 tablet  0  . promethazine (PHENERGAN) 25 MG tablet Take 1 tablet (25 mg total) by mouth every 6 (six) hours as needed for nausea.  20 tablet  0  . promethazine (PHENERGAN) 25 MG tablet Take 1 tablet (25 mg total) by mouth every 6 (six) hours as needed for nausea or vomiting.  15 tablet  0  . sertraline (ZOLOFT) 100 MG tablet Take 100 mg by mouth daily.      . tamsulosin (FLOMAX) 0.4 MG CAPS capsule Take 1 capsule (0.4 mg total) by mouth daily.  30 capsule  3  . traZODone (DESYREL) 100 MG tablet Take 1 tablet (100 mg total) by mouth at bedtime.  30 tablet  3  . [DISCONTINUED] metoprolol tartrate (LOPRESSOR) 25 MG tablet Take 25 mg by mouth 2 (two)  times daily.         No current facility-administered medications for this visit.    Past Medical History  Diagnosis Date  . Diabetes mellitus     Type 2  . Hyperlipidemia   . Depression   . Coronary artery disease     s/p BMS to Ramus 10/2010;  Cath 01/22/12 prox 30-40% LAD, LCx ramus w/ hazy 70-80% in-stent restenosis, EF 60% treated medically  . HTN (hypertension)   . Tobacco abuse   . Obesity   . Anxiety   . Melanoma 2007    surgery at [facility], Followed by [facility]  . Myocardial infarction 2012  . GERD (gastroesophageal reflux disease)   . Hyperlipidemia   . Asthma   . Coronary artery disease     s/p BMS to Ramus 10/2010;  Cath 01/22/12 prox 30-40% LAD, LCx ramus w/ hazy 70-80% in-stent restenosis, EF 60% treated medically   . Major depression, chronic 08/13/2012  . Gastroparesis 05/2013  . Tubular adenoma   . COPD (chronic  obstructive pulmonary disease)     Past Surgical History  Procedure Laterality Date  . Melanoma surgery  2007    [place], removed lymph nodes under arm as well,Left abd  . Cardiac catheterization      with stent  . Esophagogastroduodenoscopy  04/24/2012    [facility]-mild erosive reflux esophagitis,dilated w/54F Venia Minks, small HH, minimal chronic gastric/bulbar erosions(No H pylori)  . Colonoscopy N/A 01/27/2013    [id number] and colonic polyps. Tubular adenomas    History   Social History  . Marital Status: Married    Spouse Name: rayann Orser    Number of Children: 0  . Years of Education: N/A   Occupational History  . disabled    Social History Main Topics  . Smoking status: Current Every Day Smoker -- 0.50 packs/day for 30 years    Types: Cigarettes    Start date: 10/09/1981  . Smokeless tobacco: Never Used  . 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 Drug Use: No  . Sexual Activity: Yes   Other Topics Concern  . Not on file   Social History Narrative   Lives w/ wife     Danley Danker Vitals:   10/14/13 1101  BP: 129/78  Pulse: 100  Height: 6\' 2"  (1.88 m)  Weight: 203 lb (92.08 kg)    PHYSICAL EXAM General: NAD Neck: No JVD, no thyromegaly or thyroid nodule.  Lungs: Clear to auscultation bilaterally with normal respiratory effort. CV: Nondisplaced PMI.  Heart regular S1/S2, no S3/S4, no murmur.  No peripheral edema.  No carotid bruit.  Normal pedal pulses.  Abdomen: Soft, nontender, no hepatosplenomegaly, no distention.  Neurologic: Alert and oriented x 3.  Psych: Normal affect. Extremities: No clubbing or cyanosis.   ECG: reviewed and available in electronic records.  Cath (April 2013): Left main: Normal  LAD: Prox 30-40% Mild diffuse plaquing throughout. Small bridging section in midsection. Large branching diagonal that came off ostium of LAD. Mild plaque in ostium.  LCX: Gave off small to moderate sized ramus and two small OMs. In  ostium of ramus there was a BMS with 70-80% hazy in-stent restenosis. Otherwise mild plaquing in the LCX  RCA: Dominant vessel with PDA and PL.Two 30% lesions in midsection. Mild plaque in PDA.  LV-gram done in the RAO projection: Ejection fraction = 60% No regional wall motion abnormalities.    ASSESSMENT AND PLAN: 1. CAD: symptomatically stable. Continue ASA, high  dose Lipitor, lisinopril, and metoprolol. 2. HTN: controlled on present therapy. No changes required. 3. Hyperlipidemia: on Lipitor 80 mg daily. In 04/2013, HDL 40, LDL 70, TG 193. 4. Tobacco abuse: cessation counseling given.  Dispo: f/u 6 months.  [name], M.D., F.A.C.C.

## 2013-10-15 ENCOUNTER — Telehealth: Payer: Self-pay | Admitting: Family Medicine

## 2013-10-15 DIAGNOSIS — E119 Type 2 diabetes mellitus without complications: Secondary | ICD-10-CM

## 2013-10-16 MED ORDER — INSULIN GLARGINE 100 UNIT/ML SOLOSTAR PEN: 14.0000 [IU] | PEN_INJECTOR | Freq: Every day | SUBCUTANEOUS | Status: DC

## 2013-10-16 NOTE — Telephone Encounter (Signed)
done

## 2013-11-02 ENCOUNTER — Other Ambulatory Visit: Payer: Self-pay

## 2013-11-02 ENCOUNTER — Encounter (HOSPITAL_COMMUNITY): Payer: Self-pay | Admitting: Emergency Medicine

## 2013-11-02 ENCOUNTER — Emergency Department (HOSPITAL_COMMUNITY)
Admission: EM | Admit: 2013-11-02 | Discharge: 2013-11-02 | Disposition: A | Discharge status: Home / Self Care | Payer: Medicare Other | Attending: Emergency Medicine | Admitting: Emergency Medicine

## 2013-11-02 ENCOUNTER — Emergency Department (HOSPITAL_COMMUNITY): Payer: Medicare Other

## 2013-11-02 DIAGNOSIS — J449 Chronic obstructive pulmonary disease, unspecified: Secondary | ICD-10-CM | POA: Insufficient documentation

## 2013-11-02 DIAGNOSIS — Z79899 Other long term (current) drug therapy: Secondary | ICD-10-CM | POA: Insufficient documentation

## 2013-11-02 DIAGNOSIS — I252 Old myocardial infarction: Secondary | ICD-10-CM | POA: Insufficient documentation

## 2013-11-02 DIAGNOSIS — J40 Bronchitis, not specified as acute or chronic: Secondary | ICD-10-CM

## 2013-11-02 DIAGNOSIS — I2510 Atherosclerotic heart disease of native coronary artery without angina pectoris: Secondary | ICD-10-CM | POA: Insufficient documentation

## 2013-11-02 DIAGNOSIS — Z9861 Coronary angioplasty status: Secondary | ICD-10-CM | POA: Insufficient documentation

## 2013-11-02 DIAGNOSIS — Z87898 Personal history of other specified conditions: Secondary | ICD-10-CM | POA: Insufficient documentation

## 2013-11-02 DIAGNOSIS — Z7982 Long term (current) use of aspirin: Secondary | ICD-10-CM | POA: Insufficient documentation

## 2013-11-02 DIAGNOSIS — J209 Acute bronchitis, unspecified: Secondary | ICD-10-CM | POA: Insufficient documentation

## 2013-11-02 DIAGNOSIS — R5383 Other fatigue: Secondary | ICD-10-CM

## 2013-11-02 DIAGNOSIS — Z794 Long term (current) use of insulin: Secondary | ICD-10-CM | POA: Insufficient documentation

## 2013-11-02 DIAGNOSIS — J4489 Other specified chronic obstructive pulmonary disease: Secondary | ICD-10-CM | POA: Insufficient documentation

## 2013-11-02 DIAGNOSIS — E119 Type 2 diabetes mellitus without complications: Secondary | ICD-10-CM | POA: Insufficient documentation

## 2013-11-02 DIAGNOSIS — F3289 Other specified depressive episodes: Secondary | ICD-10-CM | POA: Insufficient documentation

## 2013-11-02 DIAGNOSIS — I10 Essential (primary) hypertension: Secondary | ICD-10-CM | POA: Insufficient documentation

## 2013-11-02 DIAGNOSIS — E785 Hyperlipidemia, unspecified: Secondary | ICD-10-CM | POA: Insufficient documentation

## 2013-11-02 DIAGNOSIS — F411 Generalized anxiety disorder: Secondary | ICD-10-CM | POA: Insufficient documentation

## 2013-11-02 DIAGNOSIS — E669 Obesity, unspecified: Secondary | ICD-10-CM | POA: Insufficient documentation

## 2013-11-02 DIAGNOSIS — F17200 Nicotine dependence, unspecified, uncomplicated: Secondary | ICD-10-CM | POA: Insufficient documentation

## 2013-11-02 DIAGNOSIS — F329 Major depressive disorder, single episode, unspecified: Secondary | ICD-10-CM | POA: Insufficient documentation

## 2013-11-02 DIAGNOSIS — R6883 Chills (without fever): Secondary | ICD-10-CM | POA: Insufficient documentation

## 2013-11-02 DIAGNOSIS — IMO0001 Reserved for inherently not codable concepts without codable children: Secondary | ICD-10-CM | POA: Insufficient documentation

## 2013-11-02 DIAGNOSIS — Z85820 Personal history of malignant melanoma of skin: Secondary | ICD-10-CM | POA: Insufficient documentation

## 2013-11-02 DIAGNOSIS — K219 Gastro-esophageal reflux disease without esophagitis: Secondary | ICD-10-CM | POA: Insufficient documentation

## 2013-11-02 DIAGNOSIS — R5381 Other malaise: Secondary | ICD-10-CM | POA: Insufficient documentation

## 2013-11-02 LAB — CBC WITH DIFFERENTIAL/PLATELET
BASOS PCT: 1 % (ref 0–1)
Basophils Absolute: 0.1 10*3/uL (ref 0.0–0.1)
Eosinophils Absolute: 0.1 10*3/uL (ref 0.0–0.7)
Eosinophils Relative: 1 % (ref 0–5)
HEMATOCRIT: 46.5 % (ref 39.0–52.0)
Hemoglobin: 16.5 g/dL (ref 13.0–17.0)
Lymphocytes Relative: 33 % (ref 12–46)
Lymphs Abs: 3 10*3/uL (ref 0.7–4.0)
MCH: 32.8 pg (ref 26.0–34.0)
MCHC: 35.5 g/dL (ref 30.0–36.0)
MCV: 92.4 fL (ref 78.0–100.0)
MONO ABS: 0.9 10*3/uL (ref 0.1–1.0)
Monocytes Relative: 10 % (ref 3–12)
NEUTROS ABS: 5.1 10*3/uL (ref 1.7–7.7)
Neutrophils Relative %: 56 % (ref 43–77)
Platelets: 315 10*3/uL (ref 150–400)
RBC: 5.03 MIL/uL (ref 4.22–5.81)
RDW: 12.6 % (ref 11.5–15.5)
WBC: 9.1 10*3/uL (ref 4.0–10.5)

## 2013-11-02 LAB — URINALYSIS, ROUTINE W REFLEX MICROSCOPIC
BILIRUBIN URINE: NEGATIVE
GLUCOSE, UA: NEGATIVE mg/dL
HGB URINE DIPSTICK: NEGATIVE
Ketones, ur: NEGATIVE mg/dL
Leukocytes, UA: NEGATIVE
Nitrite: NEGATIVE
PH: 7.5 (ref 5.0–8.0)
Protein, ur: NEGATIVE mg/dL
SPECIFIC GRAVITY, URINE: 1.015 (ref 1.005–1.030)
Urobilinogen, UA: 0.2 mg/dL (ref 0.0–1.0)

## 2013-11-02 LAB — HEPATIC FUNCTION PANEL
ALBUMIN: 3.7 g/dL (ref 3.5–5.2)
ALT: 16 U/L (ref 0–53)
AST: 12 U/L (ref 0–37)
Alkaline Phosphatase: 60 U/L (ref 39–117)
Bilirubin, Direct: 0.2 mg/dL (ref 0.0–0.3)
Indirect Bilirubin: 0.3 mg/dL (ref 0.3–0.9)
Total Bilirubin: 0.5 mg/dL (ref 0.3–1.2)
Total Protein: 6.8 g/dL (ref 6.0–8.3)

## 2013-11-02 LAB — BASIC METABOLIC PANEL
BUN: 11 mg/dL (ref 6–23)
CO2: 29 meq/L (ref 19–32)
Calcium: 9.6 mg/dL (ref 8.4–10.5)
Chloride: 98 mEq/L (ref 96–112)
Creatinine, Ser: 0.93 mg/dL (ref 0.50–1.35)
GFR calc non Af Amer: >90 mL/min (ref >90)
Glucose, Bld: 135 mg/dL — ABNORMAL HIGH (ref 70–99)
Potassium: 3.9 mEq/L (ref 3.7–5.3)
Sodium: 138 mEq/L (ref 137–147)

## 2013-11-02 LAB — INFLUENZA PANEL BY PCR (TYPE A & B)
H1N1FLUPCR: NOT DETECTED
INFLBPCR: NEGATIVE
Influenza A By PCR: NEGATIVE

## 2013-11-02 LAB — TROPONIN I: Troponin I: <0.3 ng/mL (ref <0.30)

## 2013-11-02 MED ORDER — IBUPROFEN 800 MG PO TABS: 800.0000 mg | ORAL_TABLET | Freq: Three times a day (TID) | ORAL | Status: DC | PRN

## 2013-11-02 MED ORDER — LEVOFLOXACIN 750 MG PO TABS: 750.0000 mg | ORAL_TABLET | Freq: Every day | ORAL | Status: DC

## 2013-11-02 MED ORDER — IBUPROFEN 800 MG PO TABS
800.0000 mg | ORAL_TABLET | Freq: Once | ORAL | Status: AC
  Administered 2013-11-02: 800 mg via ORAL
  Filled 2013-11-02: qty 1

## 2013-11-02 NOTE — Discharge Instructions (Signed)
Follow up with your md this week for recheck  °

## 2013-11-02 NOTE — ED Provider Notes (Addendum)
CSN: [id number]     Arrival date & time 11/02/13  1545 History   First MD Initiated Contact with Patient 11/02/13 1555     This chart was scribed for Maudry Diego, MD by Forrestine Him, ED Scribe. This patient was seen in room APA08/APA08 and the patient's care was started 4:05 PM.   Chief Complaint  Patient presents with  . Chest Pain  . Generalized Body Aches   Patient is a 48 y.o. male presenting with chest pain. The history is provided by the patient. No language interpreter was used.  Chest Pain Pain location:  Unable to specify Pain quality: aching and tightness   Pain radiates to:  Does not radiate Pain radiates to the back: no   Pain severity:  Mild Onset quality:  Gradual Timing:  Intermittent Associated symptoms: no abdominal pain, no back pain, no cough, no fatigue and no headache     HPI Comments: Dustin Cruz is a 48 y.o. Male brought in by ambulance with multiple medical problems including a PMHx of DM, HTN, MI, and coronary artery disease who presents to the Emergency Department complaining of sudden onset, unchanged, constant generalized body aches with associated chills and weakness to the lower extremities that initially started earlier today. Pt also reports chest tightness brought on by breathing. States he has not tried anything for his chest discomfort today. He denies any aggravating or alleviating factors at this time. He reports a PMHx of a MI in 2012.   Past Medical History  Diagnosis Date  . Diabetes mellitus     Type 2  . Hyperlipidemia   . Depression   . Coronary artery disease     s/p BMS to Ramus 10/2010;  Cath 01/22/12 prox 30-40% LAD, LCx ramus w/ hazy 70-80% in-stent restenosis, EF 60% treated medically  . HTN (hypertension)   . Tobacco abuse   . Obesity   . Anxiety   . Melanoma 2007    surgery at [facility], Followed by Ne[facility]  . Myocardial infarction 2012  . GERD (gastroesophageal reflux disease)   . Hyperlipidemia   . Asthma   . Coronary  artery disease     s/p BMS to Ramus 10/2010;  Cath 01/22/12 prox 30-40% LAD, LCx ramus w/ hazy 70-80% in-stent restenosis, EF 60% treated medically   . Major depression, chronic 08/13/2012  . Gastroparesis 05/2013  . Tubular adenoma   . COPD (chronic obstructive pulmonary disease)    Past Surgical History  Procedure Laterality Date  . Melanoma surgery  2007    [place], removed lymph nodes under arm as well,Left abd  . Cardiac catheterization      with stent  . Esophagogastroduodenoscopy  04/24/2012    [place]-mild erosive reflux esophagitis,dilated w/72F Venia Minks, small HH, minimal chronic gastric/bulbar erosions(No H pylori)  . Colonoscopy N/A 01/27/2013    [id number] and colonic polyps. Tubular adenomas  . Coronary angioplasty with stent placement     Family History  Problem Relation Age of Onset  . Lung cancer    . Heart disease    . Other      not real familiar with family history  . Colon cancer Neg Hx   . Colon polyps Neg Hx    History  Substance Use Topics  . Smoking status: Current Every Day Smoker -- 0.50 packs/day for 30 years    Types: Cigarettes    Start date: 10/09/1981  . Smokeless tobacco: Never Used  . 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 Negative for appetite change and fatigue.  HENT: Negative for congestion, ear discharge and sinus pressure.   Eyes: Negative for discharge.  Respiratory: Positive for chest tightness. Negative for cough.   Cardiovascular: Positive for chest pain.  Gastrointestinal: Negative for abdominal pain and diarrhea.  Genitourinary: Negative for frequency and hematuria.  Musculoskeletal: Positive for myalgias. Negative for back pain.  Skin: Negative for rash.  Neurological: Negative for seizures and headaches.  Psychiatric/Behavioral: Negative for hallucinations.    Allergies  Review of patient's allergies indicates no known allergies.  Home  Medications   Current Outpatient Rx  Name  Route  Sig  Dispense  Refill  . albuterol (PROVENTIL HFA;VENTOLIN HFA) 108 (90 BASE) MCG/ACT inhaler   Inhalation   Inhale 2 puffs into the lungs every 6 (six) hours as needed for wheezing.   1 Inhaler   2   . aspirin EC 81 MG tablet   Oral   Take 81 mg by mouth daily.         [name] atorvastatin (LIPITOR) 80 MG tablet   Oral   Take 1 tablet (80 mg total) by mouth daily.   90 tablet   3   . gabapentin (NEURONTIN) 400 MG capsule   Oral   Take 1 capsule (400 mg total) by mouth 3 (three) times daily.   90 capsule   3   . HYDROcodone-acetaminophen (NORCO/VICODIN) 5-325 MG per tablet   Oral   Take 1 tablet by mouth every 6 (six) hours as needed for moderate pain.   20 tablet   0   . Insulin Glargine (LANTUS SOLOSTAR) 100 UNIT/ML Solostar Pen   Subcutaneous   Inject 14 Units into the skin at bedtime.   15 mL   2   . lisinopril (PRINIVIL,ZESTRIL) 20 MG tablet   Oral   Take 1 tablet (20 mg total) by mouth daily.   90 tablet   3   . metFORMIN (GLUCOPHAGE) 1000 MG tablet   Oral   Take 1 tablet (1,000 mg total) by mouth 2 (two) times daily with a meal.   180 tablet   3   . metoprolol succinate (TOPROL-XL) 25 MG 24 hr tablet   Oral   Take 1 tablet (25 mg total) by mouth daily.   30 tablet   6   . nitroGLYCERIN (NITROSTAT) 0.4 MG SL tablet   Sublingual   Place 1 tablet (0.4 mg total) under the tongue every 5 (five) minutes as needed. For chest pain.   25 tablet   3   . omeprazole (PRILOSEC) 40 MG capsule   Oral   Take 1 capsule (40 mg total) by mouth 2 (two) times daily.   60 capsule   5   . ondansetron (ZOFRAN) 4 MG tablet   Oral   Take 1 tablet (4 mg total) by mouth every 8 (eight) hours as needed for nausea.   30 tablet   3   . pantoprazole (PROTONIX) 20 MG tablet   Oral   Take 1 tablet (20 mg total) by mouth daily.   20 tablet   0   . promethazine (PHENERGAN) 25 MG tablet   Oral   Take 1 tablet (25 mg  total) by mouth every 6 (six) hours as needed for nausea.   20 tablet   0   . promethazine (PHENERGAN) 25 MG tablet   Oral   Take  1 tablet (25 mg total) by mouth every 6 (six) hours as needed for nausea or vomiting.   15 tablet   0   . sertraline (ZOLOFT) 100 MG tablet   Oral   Take 100 mg by mouth daily.         . tamsulosin (FLOMAX) 0.4 MG CAPS capsule   Oral   Take 1 capsule (0.4 mg total) by mouth daily.   30 capsule   3   . traZODone (DESYREL) 100 MG tablet   Oral   Take 1 tablet (100 mg total) by mouth at bedtime.   30 tablet   3    Triage Vitals: BP 128/72  Pulse 83  Temp(Src) 98.5 F (36.9 C) (Oral)  Resp 16  Ht 6\' 2"  (1.88 m)  Wt 202 lb (91.627 kg)  BMI 25.92 kg/m2  SpO2 96%  Physical Exam  Constitutional: He is oriented to person, place, and time. He appears well-developed.  HENT:  Head: Normocephalic.  Eyes: Conjunctivae and EOM are normal. No scleral icterus.  Neck: Neck supple. No thyromegaly present.  Cardiovascular: Normal rate and regular rhythm.  Exam reveals no gallop and no friction rub.   No murmur heard. Pulmonary/Chest: No stridor. He has no wheezes. He has no rales. He exhibits no tenderness.  Abdominal: He exhibits no distension. There is no tenderness. There is no rebound.  Musculoskeletal: Normal range of motion. He exhibits no edema.  Lymphadenopathy:    He has no cervical adenopathy.  Neurological: He is oriented to person, place, and time. He exhibits normal muscle tone. Coordination normal.  Skin: No rash noted. No erythema.  Psychiatric: He has a normal mood and affect. His behavior is normal.    ED Course  Procedures (including critical care time)  DIAGNOSTIC STUDIES: Oxygen Saturation is 96% on RA, adequate by my interpretation.    COORDINATION OF CARE: 4:12 PM- Will order EKG, chest X-Ray, blood panel, and troponin I. Discussed treatment plan with pt at bedside and pt agreed to plan.     Labs Review Labs Reviewed   CBC WITH DIFFERENTIAL  BASIC METABOLIC PANEL  TROPONIN I   Imaging Review No results found.  EKG Interpretation    Date/Time:  Sunday November 02 2013 15:38:52 EST Ventricular Rate:  82 PR Interval:  200 QRS Duration: 138 QT Interval:  404 QTC Calculation: 472 R Axis:   49 Text Interpretation:  Normal sinus rhythm Non-specific intra-ventricular conduction block Cannot rule out Anteroseptal infarct , age undetermined T wave abnormality, consider inferior ischemia Abnormal ECG When compared with ECG of 25-Nov-2012 05:51, Non-specific intra-ventricular conduction block has replaced Left bundle branch block Confirmed by Linley Moskal  MD, Anabeth Chilcott ([id number]) on 11/02/2013 8:33:02 PM            MDM  The chart was scribed for me under my direct supervision.  I personally performed the history, physical, and medical decision making and all procedures in the evaluation of this patient.Maudry Diego, MD 11/02/13 2033  Maudry Diego, MD 11/02/13 [phone]

## 2013-11-02 NOTE — ED Notes (Signed)
Pt states he feel better and wants to go home.

## 2013-11-02 NOTE — ED Notes (Signed)
Pt states he feels a little bit better.  States his chest pain is better.

## 2013-11-02 NOTE — ED Notes (Signed)
Pt reports chills, bodyaches, chest pain, and dizziness today.  Unknown if has had fever.  Reports chest feels tight.  EMS administered 324mg  baby asa and 1 sl Nitro.  Pt's pain decreased from 10 to 6 after nitro but now rates at 8.

## 2013-11-11 ENCOUNTER — Other Ambulatory Visit (HOSPITAL_COMMUNITY): Payer: Self-pay | Admitting: Physician Assistant

## 2013-12-07 ENCOUNTER — Emergency Department (HOSPITAL_COMMUNITY)
Admission: EM | Admit: 2013-12-07 | Discharge: 2013-12-08 | Disposition: A | Discharge status: Home / Self Care | Payer: Medicare Other | Attending: Emergency Medicine | Admitting: Emergency Medicine

## 2013-12-07 ENCOUNTER — Encounter (HOSPITAL_COMMUNITY): Payer: Self-pay | Admitting: Emergency Medicine

## 2013-12-07 ENCOUNTER — Emergency Department (HOSPITAL_COMMUNITY): Payer: Medicare Other

## 2013-12-07 DIAGNOSIS — R1032 Left lower quadrant pain: Secondary | ICD-10-CM | POA: Insufficient documentation

## 2013-12-07 DIAGNOSIS — R109 Unspecified abdominal pain: Secondary | ICD-10-CM

## 2013-12-07 DIAGNOSIS — Z9889 Other specified postprocedural states: Secondary | ICD-10-CM | POA: Insufficient documentation

## 2013-12-07 DIAGNOSIS — F411 Generalized anxiety disorder: Secondary | ICD-10-CM | POA: Insufficient documentation

## 2013-12-07 DIAGNOSIS — Z85828 Personal history of other malignant neoplasm of skin: Secondary | ICD-10-CM | POA: Insufficient documentation

## 2013-12-07 DIAGNOSIS — J449 Chronic obstructive pulmonary disease, unspecified: Secondary | ICD-10-CM | POA: Insufficient documentation

## 2013-12-07 DIAGNOSIS — E785 Hyperlipidemia, unspecified: Secondary | ICD-10-CM | POA: Insufficient documentation

## 2013-12-07 DIAGNOSIS — K5900 Constipation, unspecified: Secondary | ICD-10-CM | POA: Insufficient documentation

## 2013-12-07 DIAGNOSIS — K219 Gastro-esophageal reflux disease without esophagitis: Secondary | ICD-10-CM | POA: Insufficient documentation

## 2013-12-07 DIAGNOSIS — Z79899 Other long term (current) drug therapy: Secondary | ICD-10-CM | POA: Insufficient documentation

## 2013-12-07 DIAGNOSIS — I2510 Atherosclerotic heart disease of native coronary artery without angina pectoris: Secondary | ICD-10-CM | POA: Insufficient documentation

## 2013-12-07 DIAGNOSIS — Z7982 Long term (current) use of aspirin: Secondary | ICD-10-CM | POA: Insufficient documentation

## 2013-12-07 DIAGNOSIS — F17200 Nicotine dependence, unspecified, uncomplicated: Secondary | ICD-10-CM | POA: Insufficient documentation

## 2013-12-07 DIAGNOSIS — J4489 Other specified chronic obstructive pulmonary disease: Secondary | ICD-10-CM | POA: Insufficient documentation

## 2013-12-07 DIAGNOSIS — I10 Essential (primary) hypertension: Secondary | ICD-10-CM | POA: Insufficient documentation

## 2013-12-07 DIAGNOSIS — Z794 Long term (current) use of insulin: Secondary | ICD-10-CM | POA: Insufficient documentation

## 2013-12-07 DIAGNOSIS — E119 Type 2 diabetes mellitus without complications: Secondary | ICD-10-CM | POA: Insufficient documentation

## 2013-12-07 DIAGNOSIS — F329 Major depressive disorder, single episode, unspecified: Secondary | ICD-10-CM | POA: Insufficient documentation

## 2013-12-07 DIAGNOSIS — E669 Obesity, unspecified: Secondary | ICD-10-CM | POA: Insufficient documentation

## 2013-12-07 DIAGNOSIS — I252 Old myocardial infarction: Secondary | ICD-10-CM | POA: Insufficient documentation

## 2013-12-07 LAB — COMPREHENSIVE METABOLIC PANEL
ALBUMIN: 4.1 g/dL (ref 3.5–5.2)
ALK PHOS: 66 U/L (ref 39–117)
ALT: 16 U/L (ref 0–53)
AST: 10 U/L (ref 0–37)
BUN: 14 mg/dL (ref 6–23)
CHLORIDE: 93 meq/L — AB (ref 96–112)
CO2: 30 mEq/L (ref 19–32)
Calcium: 9.5 mg/dL (ref 8.4–10.5)
Creatinine, Ser: 0.91 mg/dL (ref 0.50–1.35)
GFR calc non Af Amer: >90 mL/min (ref >90)
GLUCOSE: 264 mg/dL — AB (ref 70–99)
POTASSIUM: 4.3 meq/L (ref 3.7–5.3)
Sodium: 132 mEq/L — ABNORMAL LOW (ref 137–147)
Total Bilirubin: 0.4 mg/dL (ref 0.3–1.2)
Total Protein: 7.4 g/dL (ref 6.0–8.3)

## 2013-12-07 LAB — URINALYSIS, ROUTINE W REFLEX MICROSCOPIC
Bilirubin Urine: NEGATIVE
Glucose, UA: >1000 mg/dL — AB
HGB URINE DIPSTICK: NEGATIVE
KETONES UR: NEGATIVE mg/dL
Leukocytes, UA: NEGATIVE
Nitrite: NEGATIVE
PROTEIN: NEGATIVE mg/dL
Specific Gravity, Urine: <1.005 — ABNORMAL LOW (ref 1.005–1.030)
Urobilinogen, UA: 0.2 mg/dL (ref 0.0–1.0)
pH: 7 (ref 5.0–8.0)

## 2013-12-07 LAB — CBC WITH DIFFERENTIAL/PLATELET
BASOS ABS: 0 10*3/uL (ref 0.0–0.1)
BASOS PCT: 0 % (ref 0–1)
Eosinophils Absolute: 0.1 10*3/uL (ref 0.0–0.7)
Eosinophils Relative: 1 % (ref 0–5)
HCT: 44.8 % (ref 39.0–52.0)
Hemoglobin: 15.9 g/dL (ref 13.0–17.0)
Lymphocytes Relative: 42 % (ref 12–46)
Lymphs Abs: 3.3 10*3/uL (ref 0.7–4.0)
MCH: 33.1 pg (ref 26.0–34.0)
MCHC: 35.5 g/dL (ref 30.0–36.0)
MCV: 93.1 fL (ref 78.0–100.0)
MONO ABS: 0.7 10*3/uL (ref 0.1–1.0)
Monocytes Relative: 9 % (ref 3–12)
Neutro Abs: 3.7 10*3/uL (ref 1.7–7.7)
Neutrophils Relative %: 48 % (ref 43–77)
Platelets: 277 10*3/uL (ref 150–400)
RBC: 4.81 MIL/uL (ref 4.22–5.81)
RDW: 12.8 % (ref 11.5–15.5)
WBC: 7.7 10*3/uL (ref 4.0–10.5)

## 2013-12-07 LAB — URINE MICROSCOPIC-ADD ON

## 2013-12-07 LAB — LIPASE, BLOOD: Lipase: 19 U/L (ref 11–59)

## 2013-12-07 MED ORDER — ACETAMINOPHEN 325 MG PO TABS
650.0000 mg | ORAL_TABLET | Freq: Once | ORAL | Status: AC
  Administered 2013-12-07: 650 mg via ORAL
  Filled 2013-12-07: qty 2

## 2013-12-07 MED ORDER — MORPHINE SULFATE 4 MG/ML IJ SOLN
4.0000 mg | Freq: Once | INTRAMUSCULAR | Status: AC
  Administered 2013-12-07: 4 mg via INTRAVENOUS
  Filled 2013-12-07: qty 1

## 2013-12-07 MED ORDER — SODIUM CHLORIDE 0.9 % IV BOLUS (SEPSIS)
1000.0000 mL | Freq: Once | INTRAVENOUS | Status: AC
  Administered 2013-12-07: 1000 mL via INTRAVENOUS

## 2013-12-07 MED ORDER — IOHEXOL 300 MG/ML  SOLN
50.0000 mL | Freq: Once | INTRAMUSCULAR | Status: AC | PRN
  Administered 2013-12-07: 50 mL via ORAL

## 2013-12-07 MED ORDER — IOHEXOL 300 MG/ML  SOLN
100.0000 mL | Freq: Once | INTRAMUSCULAR | Status: AC | PRN
  Administered 2013-12-07: 100 mL via INTRAVENOUS

## 2013-12-07 MED ORDER — ONDANSETRON HCL 4 MG/2ML IJ SOLN
4.0000 mg | Freq: Once | INTRAMUSCULAR | Status: AC
  Administered 2013-12-07: 4 mg via INTRAVENOUS
  Filled 2013-12-07: qty 2

## 2013-12-07 NOTE — ED Notes (Signed)
Pt with abd pain and incont of loose stool for 2 weeks, denies N/V, HA today; left elbow pain since yesterday after hitting it

## 2013-12-07 NOTE — ED Provider Notes (Signed)
CSN: [id number]     Arrival date & time 12/07/13  7782 History  This chart was scribed for Dustin Christen, MD by [name], ED Scribe. This patient was seen in room APA10/APA10 and the patient's care was started at 8:54 PM.   Chief Complaint  Patient presents with  . Abdominal Pain     The history is provided by the patient. No language interpreter was used.    HPI Comments: Dustin Cruz is a 48 y.o. male who presents to the Emergency Department complaining of LLQ pain that started one week ago. The pain is described as a pressure sensation and he reports associated malodorous urine. He states that he has been able to eat and drink without complications. He denies any nausea, emesis, diarrhea. He has a prior melanoma removal in the LLQ done to the muscle but denies intestinal involvement.   He has a secondary complaint of left elbow pain. He reports decreased ROM due to pain. He admits that he has hit his elbow at least six times within the past two days on doors in his home.   PCP is [name]  Past Medical History  Diagnosis Date  . Diabetes mellitus     Type 2  . Hyperlipidemia   . Depression   . Coronary artery disease     s/p BMS to Ramus 10/2010;  Cath 01/22/12 prox 30-40% LAD, LCx ramus w/ hazy 70-80% in-stent restenosis, EF 60% treated medically  . HTN (hypertension)   . Tobacco abuse   . Obesity   . Anxiety   . Melanoma 2007    surgery at [facility], Followed by [facility]  . Myocardial infarction 2012  . GERD (gastroesophageal reflux disease)   . Hyperlipidemia   . Asthma   . Coronary artery disease     s/p BMS to Ramus 10/2010;  Cath 01/22/12 prox 30-40% LAD, LCx ramus w/ hazy 70-80% in-stent restenosis, EF 60% treated medically   . Major depression, chronic 08/13/2012  . Gastroparesis 05/2013  . Tubular adenoma   . COPD (chronic obstructive pulmonary disease)    Past Surgical History  Procedure Laterality Date  . Melanoma surgery  2007    [facility], removed lymph  nodes under arm as well,Left abd  . Cardiac catheterization      with stent  . Esophagogastroduodenoscopy  04/24/2012    [facility]-mild erosive reflux esophagitis,dilated w/45F Venia Minks, small HH, minimal chronic gastric/bulbar erosions(No H pylori)  . Colonoscopy N/A 01/27/2013    [facility]:NTIRWE and colonic polyps. Tubular adenomas  . Coronary angioplasty with stent placement     Family History  Problem Relation Age of Onset  . Lung cancer    . Heart disease    . Other      not real familiar with family history  . Colon cancer Neg Hx   . Colon polyps Neg Hx    History  Substance Use Topics  . Smoking status: Current Every Day Smoker -- 0.50 packs/day for 30 years    Types: Cigarettes    Start date: 10/09/1981  . Smokeless tobacco: Never Used  . 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    Allergies  Review of patient's allergies indicates no known allergies.  Home Medications   Current Outpatient Rx  Name  Route  Sig  Dispense  Refill  . albuterol (PROVENTIL HFA;VENTOLIN HFA) 108 (90 BASE) MCG/ACT inhaler   Inhalation   Inhale 2 puffs into the lungs every 6 (six) hours as needed for wheezing.   1 Inhaler   2   . aspirin EC 81 MG tablet   Oral   Take 81 mg by mouth daily.         [name] atorvastatin (LIPITOR) 80 MG tablet   Oral   Take 1 tablet (80 mg total) by mouth daily.   90 tablet   3   . gabapentin (NEURONTIN) 400 MG capsule   Oral   Take 1 capsule (400 mg total) by mouth 3 (three) times daily.   90 capsule   3   . Insulin Glargine (LANTUS SOLOSTAR) 100 UNIT/ML Solostar Pen   Subcutaneous   Inject 14 Units into the skin at bedtime.   15 mL   2   . metFORMIN (GLUCOPHAGE) 1000 MG tablet   Oral   Take 1 tablet (1,000 mg total) by mouth 2 (two) times daily with a meal.   180 tablet   3   .  metoprolol succinate (TOPROL-XL) 25 MG 24 hr tablet   Oral   Take 1 tablet (25 mg total) by mouth daily.   30 tablet   6   . omeprazole (PRILOSEC) 40 MG capsule   Oral   Take 1 capsule (40 mg total) by mouth 2 (two) times daily.   60 capsule   5   . sertraline (ZOLOFT) 100 MG tablet   Oral   Take 100 mg by mouth daily.         . tamsulosin (FLOMAX) 0.4 MG CAPS capsule   Oral   Take 1 capsule (0.4 mg total) by mouth daily.   30 capsule   3   . traZODone (DESYREL) 150 MG tablet   Oral   Take 150 mg by mouth at bedtime.         . nitroGLYCERIN (NITROSTAT) 0.4 MG SL tablet   Sublingual   Place 1 tablet (0.4 mg total) under the tongue every 5 (five) minutes as needed. For chest pain.   25 tablet   3    Triage Vitals: BP 157/93  Pulse 85  Temp(Src) 97.4 F (36.3 C) (Oral)  Resp 18  Ht 6\' 2"  (1.88 m)  Wt 200 lb (90.719 kg)  BMI 25.67 kg/m2  SpO2 98%  Physical Exam  Nursing note and vitals reviewed. Constitutional: He is oriented to person, place, and time. He appears well-developed and well-nourished.  HENT:  Head: Normocephalic and atraumatic.  Eyes: Conjunctivae and EOM are normal. Pupils are equal, round, and reactive to light.  Neck: Normal range of motion. Neck supple.  Cardiovascular: Normal rate, regular rhythm and normal heart sounds.   Pulmonary/Chest: Effort normal and breath sounds normal.  Abdominal: Soft. Bowel sounds are normal. There is no tenderness.  Well healed scar to the LLQ  Musculoskeletal: Normal range of motion.  Tenderness over diffuse left elbow but has FROM  Neurological: He is alert and oriented to person, place, and time.  Skin: Skin is warm and dry.  Psychiatric: He has a normal mood and affect. His behavior is normal.    ED Course  Procedures (including critical care time)  DIAGNOSTIC STUDIES: Oxygen Saturation is 98% on RA, nml by my interpretation.    COORDINATION OF CARE: 8:58 PM-Discussed treatment plan which includes  x-ray of left elbow and CT of abdomen  with pt at bedside and pt agreed to plan.   Labs Review Labs Reviewed  COMPREHENSIVE METABOLIC PANEL - Abnormal; Notable for the following:    Sodium 132 (*)    Chloride 93 (*)    Glucose, Bld 264 (*)    All other components within normal limits  URINALYSIS, ROUTINE W REFLEX MICROSCOPIC - Abnormal; Notable for the following:    Specific Gravity, Urine <1.005 (*)    Glucose, UA >1000 (*)    All other components within normal limits  CBC WITH DIFFERENTIAL  LIPASE, BLOOD  URINE MICROSCOPIC-ADD ON   Imaging Review Ct Abdomen Pelvis W Contrast  12/07/2013   CLINICAL DATA:  Left lower quadrant pain. History of gastroparesis in tubular adenoma. Diabetes.  EXAM: CT ABDOMEN AND PELVIS WITH CONTRAST  TECHNIQUE: Multidetector CT imaging of the abdomen and pelvis was performed using the standard protocol following bolus administration of intravenous contrast.  CONTRAST:  100mL OMNIPAQUE IOHEXOL 300 MG/ML SOLN, 50mL OMNIPAQUE IOHEXOL 300 MG/ML SOLN  COMPARISON:  DG ABD ACUTE W/CHEST dated 08/21/2013; CT ABD/PELV WO CM dated 04/14/2013  FINDINGS: Included view of the lung bases are clear. Visualized heart and pericardium are unremarkable.  The liver, spleen, gallbladder, pancreas and adrenal glands are unremarkable.  The stomach, small and large bowel are normal in course and caliber without inflammatory changes. Mildly prominent loops of small bowel in the left upper quadrant associated with mild wall thickening. Moderate amount of retained large bowel stool. Mild distal small bowel feces. Normal appendix. No intraperitoneal free fluid nor free air.  Kidneys are orthotopic, demonstrating symmetric enhancement without nephrolithiasis, hydronephrosis or renal masses. The unopacified ureters are normal in course and caliber. Delayed imaging through the kidneys demonstrates symmetric prompt excretion to the proximal urinary collecting system. Urinary bladder is partially  distended and unremarkable.  Great vessels are normal in course and caliber with mild calcific atherosclerosis. No lymphadenopathy by CT size criteria. Internal reproductive organs are unremarkable. The soft tissues and included osseous structures are nonsuspicious. L4-5 and L5-S1 small broad-based disc bulges with mild to moderate L5-S1 neural foraminal narrowing.  IMPRESSION: Moderate amount of retained large bowel stool . Distal small bowel feces suggests chronic stasis.  Mildly prominent jejunum, a nonspecific finding though can be seen with infectious or inflammatory bowel disease.   Electronically Signed   By: Awilda Metroourtnay  Bloomer   On: 12/07/2013 23:08     EKG Interpretation None      MDM   Final diagnoses:  Abdominal pain  Constipation   No acute abdomen. CT scan shows large amount of stool. Rx Fleets enema and magnesium citrate I personally performed the services described in this documentation, which was scribed in my presence. The recorded information has been reviewed and is accurate.      Donnetta HutchingBrian Bernd Crom, MD 12/08/13 [id number]

## 2013-12-07 NOTE — ED Notes (Signed)
Pt states he has had LLQ abdominal pain x 2 weeks. Pain rated 10/10 and radiates to back. Pt states his "belly is larger than usual" and he has had a lot of gas with it. Pt also reports hitting L. Elbow three times yesterday and twice today. Has knot at elbow. Will continue to monitor.

## 2013-12-07 NOTE — Discharge Instructions (Signed)
Magnesium citrate and/or Fleet's enema for constipation.  Prune juice.  Return if worse.

## 2013-12-10 ENCOUNTER — Encounter: Payer: Self-pay | Admitting: Family Medicine

## 2013-12-10 ENCOUNTER — Ambulatory Visit (INDEPENDENT_AMBULATORY_CARE_PROVIDER_SITE_OTHER): Payer: Medicare Other | Admitting: Family Medicine

## 2013-12-10 VITALS — BP 117/77 | HR 84 | Temp 97.7°F | Ht 74.0 in | Wt 211.0 lb

## 2013-12-10 DIAGNOSIS — E119 Type 2 diabetes mellitus without complications: Secondary | ICD-10-CM

## 2013-12-10 DIAGNOSIS — M7710 Lateral epicondylitis, unspecified elbow: Secondary | ICD-10-CM

## 2013-12-10 DIAGNOSIS — K5900 Constipation, unspecified: Secondary | ICD-10-CM

## 2013-12-10 DIAGNOSIS — E785 Hyperlipidemia, unspecified: Secondary | ICD-10-CM

## 2013-12-10 DIAGNOSIS — R531 Weakness: Secondary | ICD-10-CM

## 2013-12-10 DIAGNOSIS — M7712 Lateral epicondylitis, left elbow: Secondary | ICD-10-CM

## 2013-12-10 DIAGNOSIS — R110 Nausea: Secondary | ICD-10-CM

## 2013-12-10 DIAGNOSIS — K529 Noninfective gastroenteritis and colitis, unspecified: Secondary | ICD-10-CM

## 2013-12-10 DIAGNOSIS — R5383 Other fatigue: Secondary | ICD-10-CM

## 2013-12-10 DIAGNOSIS — R5381 Other malaise: Secondary | ICD-10-CM

## 2013-12-10 DIAGNOSIS — K5289 Other specified noninfective gastroenteritis and colitis: Secondary | ICD-10-CM

## 2013-12-10 LAB — POCT CBC
Granulocyte percent: 70.1 %G (ref 37–80)
HCT, POC: 46.2 % (ref 43.5–53.7)
Hemoglobin: 15.8 g/dL (ref 14.1–18.1)
Lymph, poc: 2.3 (ref 0.6–3.4)
MCH, POC: 32.9 pg — AB (ref 27–31.2)
MCHC: 34.2 g/dL (ref 31.8–35.4)
MCV: 96.2 fL (ref 80–97)
MPV: 8.4 fL (ref 0–99.8)
POC Granulocyte: 5.8 (ref 2–6.9)
Platelet Count, POC: 253 10*3/uL (ref 142–424)
RBC: 4.8 M/uL (ref 4.69–6.13)
RDW, POC: 13.1 %
WBC: 8.3 10*3/uL (ref 4.6–10.2)

## 2013-12-10 LAB — GLUCOSE, POCT (MANUAL RESULT ENTRY): POC Glucose: 248 mg/dl — AB (ref 70–99)

## 2013-12-10 LAB — POCT GLYCOSYLATED HEMOGLOBIN (HGB A1C): Hemoglobin A1C: 10.4

## 2013-12-10 MED ORDER — ONDANSETRON HCL 8 MG PO TABS: 8.0000 mg | ORAL_TABLET | Freq: Three times a day (TID) | ORAL | Status: DC | PRN

## 2013-12-10 NOTE — Addendum Note (Signed)
Addended by: Pollyann Kennedy F on: 12/10/2013 12:42 PM   Modules accepted: Orders

## 2013-12-10 NOTE — Patient Instructions (Signed)

## 2013-12-10 NOTE — Progress Notes (Signed)
   Subjective:    Patient ID: Dustin Cruz, male    DOB: [birth year], 48 y.o.   MRN: [id number]  HPI  This 48 y.o. male presents for evaluation of multiple medical complaints.  He was seen in the ED recently for abdominal pain due to constipation and impaction.  He has been having a lot of diarrhea And has moved his bowels a lot over the last few days and states he has gone so much he could fill up a bucket.  He has diabetes and has been having elevated blood sugars.  He has chronic pain. He has hx of poorly controlled diabetes.  He has left elbow pain and has been hitting his elbow against doors and walls accidentally and this has worsened his elbow pain.  He was seeing orthopedics for Left lateral epicondylitis and has had injection he states which did not help.  He has been feeling weak and washed out.  He is due for labs. 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  Review of Systems C/o weakness, diarrhea, nausea, left elbow pain, and elevated blood sugars. No chest pain, SOB, HA, dizziness, vision change, N/V, diarrhea, constipation, dysuria, urinary urgency or frequency, myalgias, arthralgias or rash.     Objective:   Physical Exam  Vital signs noted  Well developed well nourished male.  HEENT - Head atraumatic Normocephalic                Eyes - PERRLA, Conjuctiva - clear Sclera- Clear EOMI                Ears - EAC's Wnl TM's Wnl Gross Hearing WNL                Nose - Nares patent                 Throat - oropharanx wnl Respiratory - Lungs CTA bilateral Cardiac - RRR S1 and S2 without murmur GI - Abdomen soft Nontender and bowel sounds active x 4 Extremities - No edema. Neuro - Grossly intact. MS - TTP left lateral elbow.     Assessment & Plan:  Weakness - Plan: POCT CBC, POCT glycosylated hemoglobin (Hb A1C), CMP14+EGFR, Lipid panel, PSA, total and free, Thyroid Panel With TSH, Vitamin B12, Vit D  25 hydroxy (rtn osteoporosis monitoring)  Nausea alone -  Plan: ondansetron (ZOFRAN) 8 MG tablet  Diabetes - Plan: POCT glycosylated hemoglobin (Hb A1C).  Discussed he make an appointment with Cherre Robins Pharm D and get diabetes under control.  Other and unspecified hyperlipidemia - Plan: Lipid panel  Left lateral epicondylitis - Plan: Ambulatory referral to Orthopedic Surgery  Gastroenteritis - Zofran 86m po tid prn Push po fluids, rest, tylenol and motrin otc prn as directed for fever, arthralgias, and myalgias.  Follow up prn if sx's continue or persist.  WLysbeth PennerFNP  Unspecified constipation - Miralax otc

## 2013-12-11 LAB — CMP14+EGFR
ALT: 16 IU/L (ref 0–44)
AST: 11 IU/L (ref 0–40)
Albumin/Globulin Ratio: 2.2 (ref 1.1–2.5)
Albumin: 4.6 g/dL (ref 3.5–5.5)
Alkaline Phosphatase: 60 IU/L (ref 39–117)
BUN/Creatinine Ratio: 17 (ref 9–20)
BUN: 18 mg/dL (ref 6–24)
CO2: 21 mmol/L (ref 18–29)
Calcium: 9.6 mg/dL (ref 8.7–10.2)
Chloride: 98 mmol/L (ref 97–108)
Creatinine, Ser: 1.07 mg/dL (ref 0.76–1.27)
GFR calc Af Amer: 94 mL/min/{1.73_m2} (ref >59)
GFR calc non Af Amer: 82 mL/min/{1.73_m2} (ref >59)
Globulin, Total: 2.1 g/dL (ref 1.5–4.5)
Glucose: 262 mg/dL — ABNORMAL HIGH (ref 65–99)
Potassium: 5 mmol/L (ref 3.5–5.2)
Sodium: 139 mmol/L (ref 134–144)
Total Bilirubin: 0.4 mg/dL (ref 0.0–1.2)
Total Protein: 6.7 g/dL (ref 6.0–8.5)

## 2013-12-11 LAB — PSA, TOTAL AND FREE
PSA, Free Pct: 37.3 %
PSA, Free: 0.41 ng/mL
PSA: 1.1 ng/mL (ref 0.0–4.0)

## 2013-12-11 LAB — VITAMIN D 25 HYDROXY (VIT D DEFICIENCY, FRACTURES): Vit D, 25-Hydroxy: 12.4 ng/mL — ABNORMAL LOW (ref 30.0–100.0)

## 2013-12-11 LAB — THYROID PANEL WITH TSH
Free Thyroxine Index: 2 (ref 1.2–4.9)
T3 Uptake Ratio: 25 % (ref 24–39)
T4, Total: 7.9 ug/dL (ref 4.5–12.0)
TSH: 0.882 u[IU]/mL (ref 0.450–4.500)

## 2013-12-11 LAB — LIPID PANEL
Chol/HDL Ratio: 3.7 ratio units (ref 0.0–5.0)
Cholesterol, Total: 161 mg/dL (ref 100–199)
HDL: 43 mg/dL (ref >39)
LDL Calculated: 77 mg/dL (ref 0–99)
Triglycerides: 207 mg/dL — ABNORMAL HIGH (ref 0–149)
VLDL Cholesterol Cal: 41 mg/dL — ABNORMAL HIGH (ref 5–40)

## 2013-12-11 LAB — VITAMIN B12: Vitamin B-12: 347 pg/mL (ref 211–946)

## 2013-12-12 ENCOUNTER — Other Ambulatory Visit: Payer: Self-pay | Admitting: Family Medicine

## 2013-12-12 MED ORDER — VITAMIN D (ERGOCALCIFEROL) 1.25 MG (50000 UNIT) PO CAPS: 50000.0000 [IU] | ORAL_CAPSULE | ORAL | Status: DC

## 2013-12-19 ENCOUNTER — Telehealth: Payer: Self-pay | Admitting: *Deleted

## 2013-12-19 NOTE — Telephone Encounter (Signed)
Bill, Ondansetron requires a prior authorization and is a high tier, the ins co requests that he try meclozine or prochlorperazine neither of which I can find that he has ever tried. Would either work for him?  Thanks.  If you don't think either will work let me know, and a reason; and I will proceed with PA and request for tier exception.[name]

## 2013-12-22 ENCOUNTER — Other Ambulatory Visit: Payer: Self-pay | Admitting: Family Medicine

## 2013-12-22 MED ORDER — PROMETHAZINE HCL 25 MG PO TABS: 25.0000 mg | ORAL_TABLET | Freq: Three times a day (TID) | ORAL | Status: DC | PRN

## 2013-12-22 NOTE — Telephone Encounter (Signed)
I sent him a rx of promethazine and tell him to use this instead of zofran

## 2013-12-24 ENCOUNTER — Telehealth: Payer: Self-pay | Admitting: Family Medicine

## 2013-12-24 NOTE — Telephone Encounter (Signed)
I didn't call patient but I looks like Dustin Cruz did on 12/19/13 regarding insurance not covering zofran / ondansetron.  Rx for promethazine has already been sent in and patient is aware.

## 2013-12-29 ENCOUNTER — Ambulatory Visit (INDEPENDENT_AMBULATORY_CARE_PROVIDER_SITE_OTHER): Payer: Medicare Other | Admitting: Pharmacist

## 2013-12-29 VITALS — BP 110/70 | HR 75 | Ht 74.0 in | Wt 205.0 lb

## 2013-12-29 DIAGNOSIS — E559 Vitamin D deficiency, unspecified: Secondary | ICD-10-CM | POA: Insufficient documentation

## 2013-12-29 DIAGNOSIS — E119 Type 2 diabetes mellitus without complications: Secondary | ICD-10-CM

## 2013-12-29 MED ORDER — CANAGLIFLOZIN-METFORMIN HCL 150-1000 MG PO TABS: 1.0000 | ORAL_TABLET | Freq: Two times a day (BID) | ORAL | Status: DC

## 2013-12-29 MED ORDER — INSULIN GLARGINE 100 UNIT/ML SOLOSTAR PEN: 20.0000 [IU] | PEN_INJECTOR | Freq: Every day | SUBCUTANEOUS | Status: DC

## 2013-12-29 NOTE — Progress Notes (Signed)
Diabetes Follow-Up Visit Chief Complaint:   Chief Complaint  Patient presents with  . Diabetes     Filed Vitals:   12/30/13 0648  BP: 110/70  Pulse: 75   Filed Weights   12/30/13 0648  Weight: 205 lb (92.987 kg)     HPI: patient with type 2 DM which is uncontrolled.  He is not checking BG regularly at home.  I verified that there are not problems with insurance coverage of testing supplies.  He has been out of Lantus for about 1 week because he was unable to get to [facility] to pick up Rx. He also has not started vitamin D that was called in about 1 week ago and seemed to be unaware of that RX or that his labs from 12/24/13 showed vitamin D insufficiency.    Current Diabetes Medications:  Lantus 14 units daily and metformin 1000mg  bid  Home BG Monitoring:  Checking 0 times a day. Has 3 readings from last 30 days - 331, 295 and 328  Low fat/carbohydrate diet?  No Nicotine Abuse?  Yes Medication Compliance?  No Exercise?  Yes  Exam Edema:  negative  Polyuria:  positive  Polydipsia:  positive Polyphagia:  negative  BMI:  Body mass index is 26.31 kg/(m^2).   Weight changes:  Decreased by about 10 lbs General Appearance:  alert, oriented, no acute distress and well nourished Mood/Affect:  normal   Lab Results  Component Value Date   HGBA1C 10.4 12/10/2013    No results found for this basenameDerl Barrow    Lab Results  Component Value Date   CHOL 149 05/07/2013   HDL 43 12/10/2013   LDLCALC 77 12/10/2013   TRIG 207* 12/10/2013   CHOLHDL 3.7 12/10/2013      Assessment: 1.  Diabetes.  Uncontrolled - not at goals 2.  Blood Pressure.  At goal 3.  Lipids.  Elevated Tg likely due to uncontrolled DM and diet  Recommendations: 1.  Medication recommendations at this time are as follows:    discontinue metformin and start Invokamet 50/1000mg  1 tablet twice a day with food  Increase lantus to 20 units daily (will increase by 2 units daily) - monitor for hypoglycemia -  reviewed hypoglycemia s/s and how to treat - written information given 2.  Reviewed HBG goals:  Fasting 80-130 and 1-2 hour post prandial <180.  Patient is instructed to check BG 2 times per day.    3.  BP goal < 140/85. 4.  LDL goal of < 100, HDL > 40 and TG < 150. 5.  Eye Exam yearly and Dental Exam every 6 months. 6.  Dietary recommendations:  Reviewed CHO counting and recommended CHO serving sizes at length 7.  Physical Activity recommendations:  Continue to walk daily 8.  Patient education on appropriate foot care.   9.  Return to clinic in 4-6 wks   Time spent counseling patient:  60 minutes  Referring provider:  Roosvelt Harps, PharmD, CPP

## 2013-12-29 NOTE — Patient Instructions (Signed)
Stop metformin  Start Invokamet 50/1000mg  1 tablet twice with breakfast and supper. Increase lantus to 16 units for 2 days, then 18 units for 2 days and then 20 units daily.  Check blood glucose / sugar 1 to 2 times daily Goal blood glucose :  Prior to meal 80-130   Withine 2 hours of a meal - less than 180  A1c was 10.4% (our goal is less than 7%)     Hypoglycemia (Low Blood Sugar) Hypoglycemia is when the glucose (sugar) in your blood is too low. Hypoglycemia can happen for many reasons. It can happen to people with or without diabetes. Hypoglycemia can develop quickly and can be a medical emergency.  CAUSES  Having hypoglycemia does not mean that you will develop diabetes. Different causes include:  Missed or delayed meals or not enough carbohydrates eaten.  Medication overdose. This could be by accident or deliberate. If by accident, your medication may need to be adjusted or changed.  Exercise or increased activity without adjustments in carbohydrates or medications.  A nerve disorder that affects body functions like your heart rate, blood pressure and digestion (autonomic neuropathy).  A condition where the stomach muscles do not function properly (gastroparesis). Therefore, medications may not absorb properly.  The inability to recognize the signs of hypoglycemia (hypoglycemic unawareness).  Absorption of insulin  may be altered.  Alcohol consumption.  Pregnancy/menstrual cycles/postpartum. This may be due to hormones.  Certain kinds of tumors. This is very rare. SYMPTOMS   Sweating.  Hunger.  Dizziness.  Blurred vision.  Drowsiness.  Weakness.  Headache.  Rapid heart beat.  Shakiness.  Nervousness. DIAGNOSIS  Diagnosis is made by monitoring blood glucose in one or all of the following ways:  Fingerstick blood glucose monitoring.  Laboratory results. TREATMENT  If you think your blood glucose is low:  Check your blood glucose, if possible. If  it is less than 70 mg/dl, take one of the following:  3-4 glucose tablets.   cup juice (prefer clear like apple).   cup "regular" soda pop.  1 cup milk.  -1 tube of glucose gel.  5-6 hard candies.  Do not over treat because your blood glucose (sugar) will only go too high.  Wait 15 minutes and recheck your blood glucose. If it is still less than 70 mg/dl (or below your target range), repeat treatment.  Eat a snack if it is more than one hour until your next meal. Sometimes, your blood glucose may go so low that you are unable to treat yourself. You may need someone to help you. You may even pass out or be unable to swallow. This may require you to get an injection of glucagon, which raises the blood glucose. HOME CARE INSTRUCTIONS  Check blood glucose as recommended by your caregiver.  Take medication as prescribed by your caregiver.  Follow your meal plan. Do not skip meals. Eat on time.  If you are going to drink alcohol, drink it only with meals.  Check your blood glucose before driving.  Check your blood glucose before and after exercise. If you exercise longer or different than usual, be sure to check blood glucose more frequently.  Always carry treatment with you. Glucose tablets are the easiest to carry.  Always wear medical alert jewelry or carry some form of identification that states that you have diabetes. This will alert people that you have diabetes. If you have hypoglycemia, they will have a better idea on what to do. [place]  IF:   You are having problems keeping your blood sugar at target range.  You are having frequent episodes of hypoglycemia.  You feel you might be having side effects from your medicines.  You have symptoms of an illness that is not improving after 3-4 days.  You notice a change in vision or a new problem with your vision. SEEK IMMEDIATE MEDICAL CARE IF:   You are a family member or friend of a person whose blood  glucose goes below 70 mg/dl and is accompanied by:  Confusion.  A change in mental status.  The inability to swallow.  Passing out. Document Released: 09/25/2005 Document Revised: 12/18/2011 Document Reviewed: 01/22/2012 [facility] Patient Information 2014 [place], [place].

## 2013-12-30 ENCOUNTER — Encounter: Payer: Self-pay | Admitting: Pharmacist

## 2014-01-23 NOTE — Telephone Encounter (Signed)
Resolved

## 2014-01-23 NOTE — Telephone Encounter (Signed)
Resolved by Tessie Fass.

## 2014-01-26 ENCOUNTER — Encounter (HOSPITAL_COMMUNITY): Payer: Self-pay | Admitting: Emergency Medicine

## 2014-01-26 ENCOUNTER — Telehealth: Payer: Self-pay | Admitting: Pharmacist

## 2014-01-26 ENCOUNTER — Emergency Department (HOSPITAL_COMMUNITY)
Admission: EM | Admit: 2014-01-26 | Discharge: 2014-01-27 | Disposition: A | Discharge status: Home / Self Care | Payer: Medicare Other | Attending: Emergency Medicine | Admitting: Emergency Medicine

## 2014-01-26 DIAGNOSIS — F411 Generalized anxiety disorder: Secondary | ICD-10-CM | POA: Insufficient documentation

## 2014-01-26 DIAGNOSIS — Z9861 Coronary angioplasty status: Secondary | ICD-10-CM | POA: Insufficient documentation

## 2014-01-26 DIAGNOSIS — I252 Old myocardial infarction: Secondary | ICD-10-CM | POA: Insufficient documentation

## 2014-01-26 DIAGNOSIS — Z9889 Other specified postprocedural states: Secondary | ICD-10-CM | POA: Insufficient documentation

## 2014-01-26 DIAGNOSIS — E669 Obesity, unspecified: Secondary | ICD-10-CM | POA: Insufficient documentation

## 2014-01-26 DIAGNOSIS — F1011 Alcohol abuse, in remission: Secondary | ICD-10-CM

## 2014-01-26 DIAGNOSIS — I2510 Atherosclerotic heart disease of native coronary artery without angina pectoris: Secondary | ICD-10-CM | POA: Insufficient documentation

## 2014-01-26 DIAGNOSIS — Z7982 Long term (current) use of aspirin: Secondary | ICD-10-CM | POA: Insufficient documentation

## 2014-01-26 DIAGNOSIS — Z8719 Personal history of other diseases of the digestive system: Secondary | ICD-10-CM | POA: Insufficient documentation

## 2014-01-26 DIAGNOSIS — F1021 Alcohol dependence, in remission: Secondary | ICD-10-CM | POA: Insufficient documentation

## 2014-01-26 DIAGNOSIS — Z85820 Personal history of malignant melanoma of skin: Secondary | ICD-10-CM | POA: Insufficient documentation

## 2014-01-26 DIAGNOSIS — R45851 Suicidal ideations: Secondary | ICD-10-CM | POA: Insufficient documentation

## 2014-01-26 DIAGNOSIS — F32A Depression, unspecified: Secondary | ICD-10-CM

## 2014-01-26 DIAGNOSIS — Z794 Long term (current) use of insulin: Secondary | ICD-10-CM | POA: Insufficient documentation

## 2014-01-26 DIAGNOSIS — E119 Type 2 diabetes mellitus without complications: Secondary | ICD-10-CM | POA: Insufficient documentation

## 2014-01-26 DIAGNOSIS — K219 Gastro-esophageal reflux disease without esophagitis: Secondary | ICD-10-CM | POA: Insufficient documentation

## 2014-01-26 DIAGNOSIS — J4489 Other specified chronic obstructive pulmonary disease: Secondary | ICD-10-CM | POA: Insufficient documentation

## 2014-01-26 DIAGNOSIS — F329 Major depressive disorder, single episode, unspecified: Secondary | ICD-10-CM | POA: Insufficient documentation

## 2014-01-26 DIAGNOSIS — F17200 Nicotine dependence, unspecified, uncomplicated: Secondary | ICD-10-CM | POA: Insufficient documentation

## 2014-01-26 DIAGNOSIS — I10 Essential (primary) hypertension: Secondary | ICD-10-CM | POA: Insufficient documentation

## 2014-01-26 DIAGNOSIS — J449 Chronic obstructive pulmonary disease, unspecified: Secondary | ICD-10-CM | POA: Insufficient documentation

## 2014-01-26 DIAGNOSIS — Z79899 Other long term (current) drug therapy: Secondary | ICD-10-CM | POA: Insufficient documentation

## 2014-01-26 LAB — URINALYSIS, ROUTINE W REFLEX MICROSCOPIC
Bilirubin Urine: NEGATIVE
Glucose, UA: 500 mg/dL — AB
Hgb urine dipstick: NEGATIVE
Ketones, ur: NEGATIVE mg/dL
Leukocytes, UA: NEGATIVE
NITRITE: NEGATIVE
PROTEIN: NEGATIVE mg/dL
SPECIFIC GRAVITY, URINE: 1.01 (ref 1.005–1.030)
UROBILINOGEN UA: 0.2 mg/dL (ref 0.0–1.0)
pH: 5.5 (ref 5.0–8.0)

## 2014-01-26 LAB — CBC WITH DIFFERENTIAL/PLATELET
Basophils Absolute: 0.1 10*3/uL (ref 0.0–0.1)
Basophils Relative: 1 % (ref 0–1)
EOS ABS: 0.1 10*3/uL (ref 0.0–0.7)
Eosinophils Relative: 1 % (ref 0–5)
HCT: 46.5 % (ref 39.0–52.0)
HEMOGLOBIN: 16.4 g/dL (ref 13.0–17.0)
LYMPHS ABS: 2.9 10*3/uL (ref 0.7–4.0)
Lymphocytes Relative: 35 % (ref 12–46)
MCH: 32 pg (ref 26.0–34.0)
MCHC: 35.3 g/dL (ref 30.0–36.0)
MCV: 90.6 fL (ref 78.0–100.0)
MONO ABS: 0.8 10*3/uL (ref 0.1–1.0)
MONOS PCT: 9 % (ref 3–12)
Neutro Abs: 4.5 10*3/uL (ref 1.7–7.7)
Neutrophils Relative %: 54 % (ref 43–77)
PLATELETS: 283 10*3/uL (ref 150–400)
RBC: 5.13 MIL/uL (ref 4.22–5.81)
RDW: 12.8 % (ref 11.5–15.5)
WBC: 8.3 10*3/uL (ref 4.0–10.5)

## 2014-01-26 LAB — BASIC METABOLIC PANEL
BUN: 7 mg/dL (ref 6–23)
CALCIUM: 9.5 mg/dL (ref 8.4–10.5)
CO2: 29 mEq/L (ref 19–32)
CREATININE: 0.84 mg/dL (ref 0.50–1.35)
Chloride: 98 mEq/L (ref 96–112)
GFR calc Af Amer: >90 mL/min (ref >90)
GFR calc non Af Amer: >90 mL/min (ref >90)
GLUCOSE: 243 mg/dL — AB (ref 70–99)
Potassium: 3.8 mEq/L (ref 3.7–5.3)
Sodium: 138 mEq/L (ref 137–147)

## 2014-01-26 LAB — RAPID URINE DRUG SCREEN, HOSP PERFORMED
Amphetamines: NOT DETECTED
BARBITURATES: NOT DETECTED
Benzodiazepines: NOT DETECTED
Cocaine: NOT DETECTED
Opiates: NOT DETECTED
TETRAHYDROCANNABINOL: NOT DETECTED

## 2014-01-26 LAB — ETHANOL: Alcohol, Ethyl (B): <11 mg/dL (ref 0–11)

## 2014-01-26 LAB — CBG MONITORING, ED: GLUCOSE-CAPILLARY: 187 mg/dL — AB (ref 70–99)

## 2014-01-26 MED ORDER — ONDANSETRON HCL 4 MG PO TABS: 4.0000 mg | ORAL_TABLET | Freq: Three times a day (TID) | ORAL | Status: DC | PRN

## 2014-01-26 MED ORDER — ACETAMINOPHEN 325 MG PO TABS: 650.0000 mg | ORAL_TABLET | ORAL | Status: DC | PRN

## 2014-01-26 MED ORDER — ASPIRIN EC 81 MG PO TBEC
81.0000 mg | DELAYED_RELEASE_TABLET | Freq: Every day | ORAL | Status: DC
  Administered 2014-01-27: 81 mg via ORAL
  Filled 2014-01-26 (×3): qty 1

## 2014-01-26 MED ORDER — METFORMIN HCL 500 MG PO TABS
ORAL_TABLET | ORAL | Status: AC
  Administered 2014-01-26: 500 mg
  Filled 2014-01-26: qty 1

## 2014-01-26 MED ORDER — TRAZODONE HCL 50 MG PO TABS
150.0000 mg | ORAL_TABLET | Freq: Every day | ORAL | Status: DC
  Administered 2014-01-26: 150 mg via ORAL
  Filled 2014-01-26: qty 3

## 2014-01-26 MED ORDER — METOPROLOL SUCCINATE ER 25 MG PO TB24
25.0000 mg | ORAL_TABLET | Freq: Every day | ORAL | Status: DC
  Administered 2014-01-26 – 2014-01-27 (×2): 25 mg via ORAL
  Filled 2014-01-26 (×4): qty 1

## 2014-01-26 MED ORDER — GABAPENTIN 400 MG PO CAPS
400.0000 mg | ORAL_CAPSULE | Freq: Three times a day (TID) | ORAL | Status: DC
  Administered 2014-01-26 – 2014-01-27 (×4): 400 mg via ORAL
  Filled 2014-01-26 (×4): qty 1

## 2014-01-26 MED ORDER — IBUPROFEN 400 MG PO TABS: 600.0000 mg | ORAL_TABLET | Freq: Three times a day (TID) | ORAL | Status: DC | PRN

## 2014-01-26 MED ORDER — CANAGLIFLOZIN 100 MG PO TABS
150.0000 mg | ORAL_TABLET | Freq: Two times a day (BID) | ORAL | Status: DC
  Administered 2014-01-26 – 2014-01-27 (×3): 150 mg via ORAL
  Filled 2014-01-26 (×5): qty 1.5

## 2014-01-26 MED ORDER — INSULIN GLARGINE 100 UNIT/ML ~~LOC~~ SOLN
20.0000 [IU] | Freq: Every day | SUBCUTANEOUS | Status: DC
  Administered 2014-01-26: 20 [IU] via SUBCUTANEOUS
  Filled 2014-01-26 (×3): qty 0.2

## 2014-01-26 MED ORDER — ALUM & MAG HYDROXIDE-SIMETH 200-200-20 MG/5ML PO SUSP: 30.0000 mL | ORAL | Status: DC | PRN

## 2014-01-26 MED ORDER — TAMSULOSIN HCL 0.4 MG PO CAPS
ORAL_CAPSULE | ORAL | Status: AC
  Filled 2014-01-26: qty 1

## 2014-01-26 MED ORDER — METOPROLOL SUCCINATE ER 25 MG PO TB24
ORAL_TABLET | ORAL | Status: AC
  Filled 2014-01-26: qty 1

## 2014-01-26 MED ORDER — LORAZEPAM 1 MG PO TABS
1.0000 mg | ORAL_TABLET | Freq: Three times a day (TID) | ORAL | Status: DC | PRN
Start: 2014-01-26 — End: 2014-01-28

## 2014-01-26 MED ORDER — PANTOPRAZOLE SODIUM 40 MG PO TBEC
80.0000 mg | DELAYED_RELEASE_TABLET | Freq: Every day | ORAL | Status: DC
  Administered 2014-01-26 – 2014-01-27 (×2): 80 mg via ORAL
  Filled 2014-01-26: qty 2

## 2014-01-26 MED ORDER — NICOTINE 21 MG/24HR TD PT24
21.0000 mg | MEDICATED_PATCH | Freq: Every day | TRANSDERMAL | Status: DC
  Administered 2014-01-26: 21 mg via TRANSDERMAL
  Filled 2014-01-26: qty 1

## 2014-01-26 MED ORDER — METOPROLOL TARTRATE 25 MG PO TABS
ORAL_TABLET | ORAL | Status: AC
  Filled 2014-01-26: qty 1

## 2014-01-26 MED ORDER — CANAGLIFLOZIN-METFORMIN HCL 150-1000 MG PO TABS: 1.0000 | ORAL_TABLET | Freq: Two times a day (BID) | ORAL | Status: DC

## 2014-01-26 MED ORDER — SERTRALINE HCL 50 MG PO TABS
100.0000 mg | ORAL_TABLET | Freq: Every day | ORAL | Status: DC
  Administered 2014-01-27: 100 mg via ORAL
  Filled 2014-01-26 (×2): qty 1

## 2014-01-26 MED ORDER — VITAMIN D (ERGOCALCIFEROL) 1.25 MG (50000 UNIT) PO CAPS
50000.0000 [IU] | ORAL_CAPSULE | ORAL | Status: DC
  Administered 2014-01-27: 50000 [IU] via ORAL
  Filled 2014-01-26 (×2): qty 1

## 2014-01-26 MED ORDER — METFORMIN HCL 500 MG PO TABS
1000.0000 mg | ORAL_TABLET | Freq: Two times a day (BID) | ORAL | Status: DC
  Administered 2014-01-27 (×2): 1000 mg via ORAL
  Filled 2014-01-26 (×2): qty 2

## 2014-01-26 MED ORDER — TAMSULOSIN HCL 0.4 MG PO CAPS
0.4000 mg | ORAL_CAPSULE | Freq: Every day | ORAL | Status: DC
  Administered 2014-01-26: 0.4 mg via ORAL
  Filled 2014-01-26 (×3): qty 1

## 2014-01-26 NOTE — BH Assessment (Signed)
Spoke with pt's nurse Parthenia Ames who reports that EDP Dr.Knapp is unavailable at this time. Nurse reports that EDP is on a call. TTS will consult with EDP at a later time.   Shaune Pollack, MS, [place] Assessment Counselor

## 2014-01-26 NOTE — ED Notes (Signed)
Pt says he is suicidal. Sent here from [facility].  Alert, talking,    4 years without etoh, but  Began drinking last week.

## 2014-01-26 NOTE — BH Assessment (Signed)
Tele Assessment Note   Dustin Cruz is an 48 y.o. male. Pt presents to [facility] with C/O depression initially reporting SI as noted. Pt reports that he presented to [facility] today due to feeling depressed and suicidal. Pt reports that [facility] referred him to the Emergency room for an evaluation. Pt reports " i get like this sometimes". Pt is unable to identify specific triggers for his depression other than recent relapse on Etoh . Pt reports that he recently relapsed on 2(40)oz beers on 01-22-14. Pt reports that he was sober from etoh for 4 years prior. Pt reports that he has not had any etoh since he relapsed. Pt reports that he attends his AA meetings at least 1x per month. Pt feels guilty about relapsing. Pt reports that he is stressed and under pressure right now but is unable to articulate why. Pt reports that he attended an [employer] meeting this past Saturday to pick up a start over chip. Pt denies current SI as he reports that he feels safe in the ER. Pt denies current SI,HI, and no AVH reported. Pt reports that he is disabled because they said he is "retarted". Pt presents flat and depressed but hopeful. Pt reports that he is separated from his wife but she is supportive and helps him manage his disability check.   Per Consult with Eston Mould. NP patient is a likely candidate appropriate for 24 hour observation unit. Obs unit is at capacity. Pt to be revaluated by extender in the morning per Heloise Purpura for reassessment for possible transfer for to obs unit versus D/C.  EDP Dr. Tomi Bamberger was notified of this plan.  Axis I: 296.22 Major Depressive Disorder, Recurrent, Moderate Axis II: Deferred Axis III:  Past Medical History  Diagnosis Date  . Diabetes mellitus     Type 2  . Hyperlipidemia   . Depression   . Coronary artery disease     s/p BMS to Ramus 10/2010;  Cath 01/22/12 prox 30-40% LAD, LCx ramus w/ hazy 70-80% in-stent restenosis, EF 60% treated medically  . HTN (hypertension)   . Tobacco abuse   .  Obesity   . Anxiety   . Myocardial infarction 2012  . GERD (gastroesophageal reflux disease)   . Hyperlipidemia   . Asthma   . Coronary artery disease     s/p BMS to Ramus 10/2010;  Cath 01/22/12 prox 30-40% LAD, LCx ramus w/ hazy 70-80% in-stent restenosis, EF 60% treated medically   . Major depression, chronic 08/13/2012  . Gastroparesis 05/2013  . Tubular adenoma   . COPD (chronic obstructive pulmonary disease)   . Melanoma 2007    surgery at [facility], Followed by Neij[facility]   Axis IV: other psychosocial or environmental problems and problems related to social environment Axis V: 41-50 serious symptoms  Past Medical History:  Past Medical History  Diagnosis Date  . Diabetes mellitus     Type 2  . Hyperlipidemia   . Depression   . Coronary artery disease     s/p BMS to Ramus 10/2010;  Cath 01/22/12 prox 30-40% LAD, LCx ramus w/ hazy 70-80% in-stent restenosis, EF 60% treated medically  . HTN (hypertension)   . Tobacco abuse   . Obesity   . Anxiety   . Myocardial infarction 2012  . GERD (gastroesophageal reflux disease)   . Hyperlipidemia   . Asthma   . Coronary artery disease     s/p BMS to Ramus 10/2010;  Cath 01/22/12 prox 30-40% LAD, LCx ramus w/ hazy  70-80% in-stent restenosis, EF 60% treated medically   . Major depression, chronic 08/13/2012  . Gastroparesis 05/2013  . Tubular adenoma   . COPD (chronic obstructive pulmonary disease)   . Melanoma 2007    surgery at [facility], Followed by Neijstrom    Past Surgical History  Procedure Laterality Date  . Melanoma surgery  2007    [place], removed lymph nodes under arm as well,Left abd  . Cardiac catheterization      with stent  . Esophagogastroduodenoscopy  04/24/2012    Rourk-mild erosive reflux esophagitis,dilated w/36F Venia Minks, small HH, minimal chronic gastric/bulbar erosions(No H pylori)  . Colonoscopy N/A 01/27/2013    NIO:[id number] and colonic polyps. Tubular adenomas  . Coronary angioplasty with stent placement       Family History:  Family History  Problem Relation Age of Onset  . Lung cancer    . Heart disease    . Other      not real familiar with family history  . Colon cancer Neg Hx   . Colon polyps Neg Hx     Social History:  reports that he has been smoking Cigarettes.  He started smoking about 32 years ago. He has a 15 Mago-year smoking history. He has never used smokeless tobacco. He reports that he does not drink alcohol or use illicit drugs.  Additional Social History:  Alcohol / Drug Use History of alcohol / drug use?: Yes Substance #1 Name of Substance 1:  (etoh-beer) 1 - Age of First Use:  (unknown) 1 - Amount (size/oz):  (pt reports recent relapse on etoh on 01-22-14 after being sober for 60yrs prior.) 1 - Frequency:  (relapse on 01-22-14, denies drinking anything since 01-22-14.) 1 - Duration:  (relapse) 1 - Last Use / Amount:  (01-22-14- 2(40)oz beers)  CIWA: CIWA-Ar BP: 133/78 mmHg Pulse Rate: 80 COWS:    Allergies: No Known Allergies  Home Medications:  (Not in a hospital admission)  OB/GYN Status:  No LMP for male patient.  General Assessment Data Location of Assessment: [facility] Is this a Tele or Face-to-Face Assessment?: Tele Assessment Is this an Initial Assessment or a Re-assessment for this encounter?: Initial Assessment Living Arrangements: Alone Can pt return to current living arrangement?: Yes Admission Status: Voluntary Is patient capable of signing voluntary admission?: Yes Transfer from: [facility] Referral Source: Other (Pt referred by [facility])     [place] Living Arrangements: Alone Name of Psychiatrist: Dr. Hoyle [facility]) Name of Therapist: No Current Provider  Education Status Is patient currently in school?: No Current Grade: NA Highest grade of school patient has completed: per pt he completed 12th grade Name of school: NA Contact person: NA  Risk to self Suicidal Ideation: No (pt denies current SI, b/c he is in a safe place  now.) Suicidal Intent: No Is patient at risk for suicide?: Yes Suicidal Plan?: No Access to Means: No What has been your use of drugs/alcohol within the last 12 months?: etoh-relapsed on 01-22-14  Previous Attempts/Gestures: Yes How many times?: 1 (thought abt shooting too much insulin in his arm ) Other Self Harm Risks: none reported Triggers for Past Attempts: Unknown Intentional Self Injurious Behavior: None Family Suicide History: No Recent stressful life event(s): Other (Comment) (recent relapse) Persecutory voices/beliefs?: No Depression: Yes Depression Symptoms: Insomnia;Guilt;Loss of interest in usual pleasures;Feeling worthless/self pity;Feeling angry/irritable Substance abuse history and/or treatment for substance abuse?: Yes Suicide prevention information given to non-admitted patients: Not applicable  Risk to Others Homicidal Ideation: No  Thoughts of Harm to Others: No Current Homicidal Intent: No Current Homicidal Plan: No Access to Homicidal Means: No Identified Victim: NA History of harm to others?: No Assessment of Violence: None Noted Violent Behavior Description: None Noted Does patient have access to weapons?: No Criminal Charges Pending?: No Does patient have a court date: No  Psychosis Hallucinations: None noted Delusions: None noted  Mental Status Report Appear/Hygiene: Disheveled Eye Contact: Poor Motor Activity: Freedom of movement Speech: Logical/coherent;Slow Level of Consciousness: Alert Mood: Depressed Affect: Depressed Anxiety Level: Minimal Thought Processes: Coherent;Relevant;Circumstantial Judgement: Impaired Orientation: Person;Place;Time;Situation Obsessive Compulsive Thoughts/Behaviors: None  Cognitive Functioning Concentration: Decreased Memory: Recent Intact;Remote Intact IQ: Average Insight: Poor Impulse Control: Fair Appetite: Fair Weight Loss:  (reports 40> lb weight loss over past weight loss) Weight Gain: 0 Sleep:  Decreased Total Hours of Sleep:  (varies) Vegetative Symptoms: None  ADLScreening [facility] Assessment Services) Patient's cognitive ability adequate to safely complete daily activities?: Yes Patient able to express need for assistance with ADLs?: Yes Independently performs ADLs?: Yes (appropriate for developmental age)  Prior Inpatient Therapy Prior Inpatient Therapy: Yes Prior Therapy Dates: per pt's report 3-4 yrs ago  Prior Therapy Facilty/Provider(s): [facility] ?? Reason for Treatment: Depression  Prior Outpatient Therapy Prior Outpatient Therapy: Yes Prior Therapy Dates: Current Provider Prior Therapy Facilty/Provider(s): [facility] Reason for Treatment: Psychiatry-Meds-"sleeping pills and nerve pills"  ADL Screening (condition at time of admission) Patient's cognitive ability adequate to safely complete daily activities?: Yes Is the patient deaf or have difficulty hearing?: No Does the patient have difficulty seeing, even when wearing glasses/contacts?: No Does the patient have difficulty concentrating, remembering, or making decisions?: Yes Patient able to express need for assistance with ADLs?: Yes Does the patient have difficulty dressing or bathing?: No Independently performs ADLs?: Yes (appropriate for developmental age) Does the patient have difficulty walking or climbing stairs?: No Weakness of Legs: None Weakness of Arms/Hands: None  Home Assistive Devices/Equipment Home Assistive Devices/Equipment: None    Abuse/Neglect Assessment (Assessment to be complete while patient is alone) Physical Abuse: Denies Verbal Abuse: Denies Sexual Abuse: Denies Exploitation of patient/patient's resources: Denies Self-Neglect: Denies Values / Beliefs Cultural Requests During Hospitalization: None Spiritual Requests During Hospitalization: None   Advance Directives (For Healthcare) Advance Directive: Patient does not have advance directive;Patient would not like information     Additional Information 1:1 In Past 12 Months?: No CIRT Risk: No Elopement Risk: No Does patient have medical clearance?: Yes     Disposition:  Disposition Initial Assessment Completed for this Encounter: Yes Disposition of Patient: Other dispositions Other disposition(s): Other (Comment) (Telepsych consult in the AM )  Jamal Collin Furman Trentman 01/26/2014 10:00 PM Shaune Pollack, MS, [place] Assessment Counselor

## 2014-01-26 NOTE — ED Notes (Addendum)
Pt given 500mg  of Metformin but order was for 1000 mg of metformin and 150 mg of invocana. Pharamacy is no longer on campus. Pharmacy and EDP aware. Pharmacy reported to admin additional 500 mg of metformin and to call [facility] for invocana 150 mg. EDP aware. AC notified of medication that was needed and reported would bring dose.

## 2014-01-26 NOTE — ED Notes (Signed)
Pt ambulated to bathroom with sitter. Nad noted at this time.

## 2014-01-26 NOTE — ED Provider Notes (Signed)
CSN: [id number]     Arrival date & time 01/26/14  1349 History   First MD Initiated Contact with Patient 01/26/14 1508     Chief Complaint  Patient presents with  . V70.1     (Consider location/radiation/quality/duration/timing/severity/associated sxs/prior Treatment) HPI Patient reports she is under a lot of stress. He states however he started feeling worse when his medication was changed about 3 weeks ago. He was taken off his metformin and put on a new diabetic pill. He states I am "on the verge of suicide". Patient states he would overdose. He states he felt so bad that 4 days ago he drinks 80 ounces of beer after being sober for 3-1/2 years. He states he almost drink again today. He states he feels very depressed. He also admits to "I might hurt somebody". He will not say who or under what circumstances. He states his last admission was 2 years ago for dual diagnosis of depression and alcohol abuse. He states yesterday he ate junk food all day and his blood sugar yesterday evening was over 300. Patient has diabetes and is on insulin pump.  PCP Dr Jacelyn Grip  Past Medical History  Diagnosis Date  . Diabetes mellitus     Type 2  . Hyperlipidemia   . Depression   . Coronary artery disease     s/p BMS to Ramus 10/2010;  Cath 01/22/12 prox 30-40% LAD, LCx ramus w/ hazy 70-80% in-stent restenosis, EF 60% treated medically  . HTN (hypertension)   . Tobacco abuse   . Obesity   . Anxiety   . Myocardial infarction 2012  . GERD (gastroesophageal reflux disease)   . Hyperlipidemia   . Asthma   . Coronary artery disease     s/p BMS to Ramus 10/2010;  Cath 01/22/12 prox 30-40% LAD, LCx ramus w/ hazy 70-80% in-stent restenosis, EF 60% treated medically   . Major depression, chronic 08/13/2012  . Gastroparesis 05/2013  . Tubular adenoma   . COPD (chronic obstructive pulmonary disease)   . Melanoma 2007    surgery at [facility], Followed by Ne[facility]strom   Past Surgical History  Procedure Laterality Date   . Melanoma surgery  2007    [place], removed lymph nodes under arm as well,Left abd  . Cardiac catheterization      with stent  . Esophagogastroduodenoscopy  04/24/2012    Rourk-mild erosive reflux esophagitis,dilated w/64F Venia Minks, small HH, minimal chronic gastric/bulbar erosions(No H pylori)  . Colonoscopy N/A 01/27/2013    HWE:[id number] and colonic polyps. Tubular adenomas  . Coronary angioplasty with stent placement     Family History  Problem Relation Age of Onset  . Lung cancer    . Heart disease    . Other      not real familiar with family history  . Colon cancer Neg Hx   . Colon polyps Neg Hx    History  Substance Use Topics  . Smoking status: Current Every Day Smoker -- 0.50 packs/day for 30 years    Types: Cigarettes    Start date: 10/09/1981  . Smokeless tobacco: Never Used  . 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  smokes 1/2-1 ppd Lives alone Separated from wife On disability for "retarded"  Review of Systems  All other systems reviewed and are negative.     Allergies  Review of patient's allergies indicates no known allergies.  Home Medications  Prior to Admission medications   Medication Sig Start Date End Date Taking? Authorizing Provider  albuterol (PROVENTIL HFA;VENTOLIN HFA) 108 (90 BASE) MCG/ACT inhaler Inhale 2 puffs into the lungs every 6 (six) hours as needed for wheezing. 08/13/12   Reece Packer, NP  aspirin EC 81 MG tablet Take 81 mg by mouth daily.    Historical Provider, MD  atorvastatin (LIPITOR) 80 MG tablet Take 1 tablet (80 mg total) by mouth daily. 03/20/13   Lysbeth Penner, FNP  Canagliflozin-Metformin HCl (INVOKAMET) [phone] MG TABS Take 1 tablet by mouth 2 (two) times daily with a meal. 12/29/13   Tammy Eckard, PHARMD  gabapentin (NEURONTIN) 400 MG capsule Take 1 capsule (400 mg total) by mouth 3 (three) times daily. 09/24/13   Lysbeth Penner, FNP  ibuprofen (ADVIL,MOTRIN) 800 MG tablet  11/04/13   Historical Provider, MD  Insulin Glargine (LANTUS SOLOSTAR) 100 UNIT/ML Solostar Pen Inject 20 Units into the skin at bedtime. 12/29/13   Tammy Eckard, PHARMD  metoprolol succinate (TOPROL-XL) 25 MG 24 hr tablet Take 1 tablet (25 mg total) by mouth daily. 09/13/12   Lendon Colonel, NP  nitroGLYCERIN (NITROSTAT) 0.4 MG SL tablet Place 1 tablet (0.4 mg total) under the tongue every 5 (five) minutes as needed. For chest pain. 08/13/12   Reece Packer, NP  omeprazole (PRILOSEC) 40 MG capsule Take 1 capsule (40 mg total) by mouth 2 (two) times daily. 09/24/13   Lysbeth Penner, FNP  promethazine (PHENERGAN) 25 MG tablet Take 1 tablet (25 mg total) by mouth every 8 (eight) hours as needed for nausea or vomiting. 12/22/13   Lysbeth Penner, FNP  sertraline (ZOLOFT) 100 MG tablet Take 100 mg by mouth daily.    Historical Provider, MD  tamsulosin (FLOMAX) 0.4 MG CAPS capsule Take 1 capsule (0.4 mg total) by mouth daily. 09/24/13   Lysbeth Penner, FNP  traZODone (DESYREL) 150 MG tablet Take 150 mg by mouth at bedtime.    Historical Provider, MD  Vitamin D, Ergocalciferol, (DRISDOL) 50000 UNITS CAPS capsule Take 1 capsule (50,000 Units total) by mouth every 7 (seven) days. 12/12/13   Lysbeth Penner, FNP   BP 131/82  Pulse 85  Temp(Src) 97.7 F (36.5 C) (Oral)  Resp 18  Ht 6\' 2"  (1.88 m)  Wt 207 lb (93.895 kg)  BMI 26.57 kg/m2  SpO2 98%  Vital signs normal   Physical Exam  Nursing note and vitals reviewed. Constitutional: He is oriented to person, place, and time. He appears well-developed and well-nourished.  Non-toxic appearance. He does not appear ill. No distress.  HENT:  Head: Normocephalic and atraumatic.  Right Ear: External ear normal.  Left Ear: External ear normal.  Nose: Nose normal. No mucosal edema or rhinorrhea.  Mouth/Throat: Oropharynx is clear and moist and mucous membranes are normal. No dental abscesses or  uvula swelling.  Eyes: Conjunctivae and EOM are normal. Pupils are equal, round, and reactive to light.  Neck: Normal range of motion and full passive range of motion without pain. Neck supple.  Cardiovascular: Normal rate, regular rhythm and normal heart sounds.  Exam reveals no gallop and no friction rub.   No murmur heard. Pulmonary/Chest: Effort normal and breath sounds normal. No respiratory distress. He has no wheezes. He has no rhonchi. He has no rales. He exhibits no tenderness and no crepitus.  Abdominal: Soft. Normal appearance and bowel sounds are normal. He exhibits no distension. There is no tenderness. There is no rebound and  no guarding.  Musculoskeletal: Normal range of motion. He exhibits no edema and no tenderness.  Moves all extremities well.   Neurological: He is alert and oriented to person, place, and time. He has normal strength. No cranial nerve deficit.  Skin: Skin is warm, dry and intact. No rash noted. No erythema. No pallor.  Psychiatric: His mood appears not anxious. His speech is delayed. He is slowed. He exhibits a depressed mood.    ED Course  Procedures (including critical care time)  20:21 Najah  TSS states candidate for observation unit but they are full, will reevaluate in the am  Labs Review Results for orders placed during the hospital encounter of 01/26/14  CBC WITH DIFFERENTIAL      Result Value Ref Range   WBC 8.3  4.0 - 10.5 K/uL   RBC 5.13  4.22 - 5.81 MIL/uL   Hemoglobin 16.4  13.0 - 17.0 g/dL   HCT 46.5  39.0 - 52.0 %   MCV 90.6  78.0 - 100.0 fL   MCH 32.0  26.0 - 34.0 pg   MCHC 35.3  30.0 - 36.0 g/dL   RDW 12.8  11.5 - 15.5 %   Platelets 283  150 - 400 K/uL   Neutrophils Relative % 54  43 - 77 %   Neutro Abs 4.5  1.7 - 7.7 K/uL   Lymphocytes Relative 35  12 - 46 %   Lymphs Abs 2.9  0.7 - 4.0 K/uL   Monocytes Relative 9  3 - 12 %   Monocytes Absolute 0.8  0.1 - 1.0 K/uL   Eosinophils Relative 1  0 - 5 %   Eosinophils Absolute 0.1   0.0 - 0.7 K/uL   Basophils Relative 1  0 - 1 %   Basophils Absolute 0.1  0.0 - 0.1 K/uL  BASIC METABOLIC PANEL      Result Value Ref Range   Sodium 138  137 - 147 mEq/L   Potassium 3.8  3.7 - 5.3 mEq/L   Chloride 98  96 - 112 mEq/L   CO2 29  19 - 32 mEq/L   Glucose, Bld 243 (*) 70 - 99 mg/dL   BUN 7  6 - 23 mg/dL   Creatinine, Ser 0.84  0.50 - 1.35 mg/dL   Calcium 9.5  8.4 - 10.5 mg/dL   GFR calc non Af Amer >90  >90 mL/min   GFR calc Af Amer >90  >90 mL/min  ETHANOL      Result Value Ref Range   Alcohol, Ethyl (B) <11  0 - 11 mg/dL  URINE RAPID DRUG SCREEN (HOSP PERFORMED)      Result Value Ref Range   Opiates NONE DETECTED  NONE DETECTED   Cocaine NONE DETECTED  NONE DETECTED   Benzodiazepines NONE DETECTED  NONE DETECTED   Amphetamines NONE DETECTED  NONE DETECTED   Tetrahydrocannabinol NONE DETECTED  NONE DETECTED   Barbiturates NONE DETECTED  NONE DETECTED  URINALYSIS, ROUTINE W REFLEX MICROSCOPIC      Result Value Ref Range   Color, Urine YELLOW  YELLOW   APPearance CLEAR  CLEAR   Specific Gravity, Urine 1.010  1.005 - 1.030   pH 5.5  5.0 - 8.0   Glucose, UA 500 (*) NEGATIVE mg/dL   Hgb urine dipstick NEGATIVE  NEGATIVE   Bilirubin Urine NEGATIVE  NEGATIVE   Ketones, ur NEGATIVE  NEGATIVE mg/dL   Protein, ur NEGATIVE  NEGATIVE mg/dL   Urobilinogen, UA 0.2  0.0 -  1.0 mg/dL   Nitrite NEGATIVE  NEGATIVE   Leukocytes, UA NEGATIVE  NEGATIVE  CBG MONITORING, ED      Result Value Ref Range   Glucose-Capillary 187 (*) 70 - 99 mg/dL   Laboratory interpretation all normal except mild hyperglycemia    Imaging Review No results found.   EKG Interpretation None      MDM   Final diagnoses:  Depression  Suicidal ideation  History of alcohol abuse    Disposition pending   Rolland Porter, MD, Abram Sander     Janice Norrie, MD 01/27/14 [id number]

## 2014-01-26 NOTE — ED Notes (Signed)
Telepsych being conducted 

## 2014-01-26 NOTE — BH Assessment (Signed)
TTS assessment completed. Per Consult with Eston Mould. NP, patient is a likely candidate appropriate for 24 hour observation unit. Obs unit is currently at capacity. Pt to be revaluated by extender in the morning per Heloise Purpura for reassessment for possible transfer  to obs unit versus D/C.   Informed EDP Dr. Rolland Porter of this plan.   Shaune Pollack, MS, [place] Assessment Counselor

## 2014-01-27 ENCOUNTER — Encounter (HOSPITAL_COMMUNITY): Payer: Self-pay | Admitting: Family

## 2014-01-27 DIAGNOSIS — F332 Major depressive disorder, recurrent severe without psychotic features: Secondary | ICD-10-CM

## 2014-01-27 LAB — CBG MONITORING, ED
GLUCOSE-CAPILLARY: 170 mg/dL — AB (ref 70–99)
GLUCOSE-CAPILLARY: 165 mg/dL — AB (ref 70–99)
Glucose-Capillary: 180 mg/dL — ABNORMAL HIGH (ref 70–99)

## 2014-01-27 MED ORDER — PANTOPRAZOLE SODIUM 40 MG PO TBEC
DELAYED_RELEASE_TABLET | ORAL | Status: AC
  Filled 2014-01-27: qty 1

## 2014-01-27 NOTE — ED Notes (Signed)
Spoke with Behavioral Health in reference to time frame for tele psych, Behavioral health advised that they could do the tele psych now, tele psych machine moved to room

## 2014-01-27 NOTE — ED Notes (Signed)
Pt sitting in chair by sitter. Pt watching tv. Vital signs taken within normal limits.

## 2014-01-27 NOTE — ED Notes (Signed)
Pt sitting up in his chair in his room. Watching TV with staff sitter at bedside. Pt vitals taken and within normal limits. Pt appeared to be doing okay asked for graham crackers and soda. Brenda Pt advocate went and got those for him. Pt now sitting in chair eating snack.

## 2014-01-27 NOTE — Progress Notes (Signed)
Inpatient Diabetes Program Recommendations  AACE/ADA: New Consensus Statement on Inpatient Glycemic Control (2013)  Target Ranges:  Prepandial:   less than 140 mg/dL      Peak postprandial:   less than 180 mg/dL (1-2 hours)      Critically ill patients:  140 - 180 mg/dL   Results for [name], [name] (MRN [id number]) as of 01/27/2014 09:17  Ref. Range 01/26/2014 22:06 01/27/2014 07:26  Glucose-Capillary Latest Range: 70-99 mg/dL 187 (H) 170 (H)   Diabetes history: DM2 Outpatient Diabetes medications: Invokamet [phone] mg BID, Lantus 20 units QHS Current orders for Inpatient glycemic control: Lantus 20 units QHS, Metformin 1000 mg BID, Invokana 150 mg BID  Inpatient Diabetes Program Recommendations Correction (SSI): Please order CBGs with Novolog moderate correction scale ACHS while holding in the ED. Diet: Please change diet from Regular to Carb Modified Diabetic diet.  Thanks, Barnie Alderman, RN, MSN, CCRN Diabetes Coordinator Inpatient Diabetes Program [phone] (Team Pager) [phone] ([facility] office) [phone] [facility] office)

## 2014-01-27 NOTE — ED Notes (Signed)
Pharmacy called and will be sending canaglifozin tabs to ED.

## 2014-01-27 NOTE — ED Notes (Signed)
Pt still waiting for tele psych to be completed this am,

## 2014-01-27 NOTE — ED Notes (Signed)
Pt given lunch tray, sitter remains at bedside,

## 2014-01-27 NOTE — Discharge Instructions (Signed)
Follow up at [facility], return if you feel worse again like you may hurt yourself or someone else.

## 2014-01-27 NOTE — ED Provider Notes (Signed)
Patient has been evaluated by TSS, nurse practitioner Catalina Pizza, who feels patient is able to be discharged. Patient is sitting in a chair outside his room. He states he feels much better. He states he's ready to go home. He denies feeling that he is suicidal anymore. He will be referred to [facility] discharge  Rolland Porter, MD, Alanson Aly, MD 01/27/14 2226

## 2014-01-27 NOTE — ED Notes (Signed)
Pt still pending tele psych for today, update given, sitter remains at bedside,

## 2014-01-27 NOTE — ED Notes (Signed)
Pt standing up at room in room beside sitter. Appears to be doing okay. Asked patient if he needed anything stated he was good at this time.

## 2014-01-27 NOTE — Consult Note (Signed)
Telepsych Consultation   Reason for Consult:  MDD with SI Referring Physician:  EDP Dustin Cruz is an 48 y.o. male.  Assessment: AXIS I:  Major Depression, Recurrent severe AXIS II:  Deferred AXIS III:   Past Medical History  Diagnosis Date  . Diabetes mellitus     Type 2  . Hyperlipidemia   . Depression   . Coronary artery disease     s/p BMS to Ramus 10/2010;  Cath 01/22/12 prox 30-40% LAD, LCx ramus w/ hazy 70-80% in-stent restenosis, EF 60% treated medically  . HTN (hypertension)   . Tobacco abuse   . Obesity   . Anxiety   . Myocardial infarction 2012  . GERD (gastroesophageal reflux disease)   . Hyperlipidemia   . Asthma   . Coronary artery disease     s/p BMS to Ramus 10/2010;  Cath 01/22/12 prox 30-40% LAD, LCx ramus w/ hazy 70-80% in-stent restenosis, EF 60% treated medically   . Major depression, chronic 08/13/2012  . Gastroparesis 05/2013  . Tubular adenoma   . COPD (chronic obstructive pulmonary disease)   . Melanoma 2007    surgery at [facility], Followed by [facility]   AXIS IV:  other psychosocial or environmental problems and problems related to social environment AXIS V:  61-70 mild symptoms  Plan:  No evidence of imminent risk to self or others at present.   Patient does not meet criteria for psychiatric inpatient admission. Supportive therapy provided about ongoing stressors. Refer to IOP. Discussed crisis plan, support from social network, calling 911, coming to the Emergency Department, and calling Suicide Hotline.  Subjective:   Dustin Cruz is a 48 y.o. male patient presenting to [facility] with depression and SI. 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 Pt denies SI, HI, and AVH, contracts for safety. Pt reports that he was upset about his relapse and felt overwhelmed. Pt affirms good support system with multiple family members including his stepdaughter as the primary supporter  with daily contact. Pt has heard a voice in his head in the past that said "go drink beer," but it may have been racing thoughts. Pt is in agreement with outpatient therapy and is sent out with a referral with [facility] team helping with this. [name]  HPI:  Patient reports he is under a lot of stress. He states however he started feeling worse when his medication was changed about 3 weeks ago. He was taken off his metformin and put on a new diabetic pill. He states I am "on the verge of suicide". Patient states he would overdose. He states he felt so bad that 4 days ago he drinks 80 ounces of beer after being sober for 3-1/2 years. He states he almost drink again today. He states he feels very depressed. He also admits to "I might hurt somebody". He will not say who or under what circumstances. He states his last admission was 2 years ago for dual diagnosis of depression and alcohol abuse. He states yesterday he ate junk food all day and his blood sugar yesterday evening was over 300. Patient has diabetes and is on insulin pump.  HPI Elements:   Location:  Generalized, [facility]. Quality:  Stable, Improving. Severity:  Severe. Timing:  Constant. Duration:  Transient. Context:  Exacerbation of depression secondary to alcoholism relapse.[name]  Past Psychiatric History: Past Medical History  Diagnosis Date  . Diabetes mellitus  Type 2  . Hyperlipidemia   . Depression   . Coronary artery disease     s/p BMS to Ramus 10/2010;  Cath 01/22/12 prox 30-40% LAD, LCx ramus w/ hazy 70-80% in-stent restenosis, EF 60% treated medically  . HTN (hypertension)   . Tobacco abuse   . Obesity   . Anxiety   . Myocardial infarction 2012  . GERD (gastroesophageal reflux disease)   . Hyperlipidemia   . Asthma   . Coronary artery disease     s/p BMS to Ramus 10/2010;  Cath 01/22/12 prox 30-40% LAD, LCx ramus w/ hazy 70-80% in-stent restenosis, EF 60% treated medically   . Major depression, chronic 08/13/2012  . Gastroparesis 05/2013   . Tubular adenoma   . COPD (chronic obstructive pulmonary disease)   . Melanoma 2007    surgery at [facility], Followed by [facility]    reports that he has been smoking Cigarettes.  He started smoking about 32 years ago. He has a 15 Feick-year smoking history. He has never used smokeless tobacco. He reports that he does not drink alcohol or use illicit drugs. Family History  Problem Relation Age of Onset  . Lung cancer    . Heart disease    . Other      not real familiar with family history  . Colon cancer Neg Hx   . Colon polyps Neg Hx    Family History Substance Abuse: No Family Supports: Yes, List: (pt reports his wife supports him and helps him with his $$$$) Living Arrangements: Alone Can pt return to current living arrangement?: Yes Allergies:  No Known Allergies  ACT Assessment Complete:  Yes:    Educational Status    Risk to Self: Risk to self Suicidal Ideation: No (pt denies current SI, b/c he is in a safe place now.) Suicidal Intent: No Is patient at risk for suicide?: Yes Suicidal Plan?: No Access to Means: No What has been your use of drugs/alcohol within the last 12 months?: etoh-relapsed on 01-22-14  Previous Attempts/Gestures: Yes How many times?: 1 (thought abt shooting too much insulin in his arm ) Other Self Harm Risks: none reported Triggers for Past Attempts: Unknown Intentional Self Injurious Behavior: None Family Suicide History: No Recent stressful life event(s): Other (Comment) (recent relapse) Persecutory voices/beliefs?: No Depression: Yes Depression Symptoms: Insomnia;Guilt;Loss of interest in usual pleasures;Feeling worthless/self pity;Feeling angry/irritable Substance abuse history and/or treatment for substance abuse?: Yes Suicide prevention information given to non-admitted patients: Not applicable  Risk to Others: Risk to Others Homicidal Ideation: No Thoughts of Harm to Others: No Current Homicidal Intent: No Current Homicidal Plan:  No Access to Homicidal Means: No Identified Victim: NA History of harm to others?: No Assessment of Violence: None Noted Violent Behavior Description: None Noted Does patient have access to weapons?: No Criminal Charges Pending?: No Does patient have a court date: No  Abuse: Abuse/Neglect Assessment (Assessment to be complete while patient is alone) Physical Abuse: Denies Verbal Abuse: Denies Sexual Abuse: Denies Exploitation of patient/patient's resources: Denies Self-Neglect: Denies  Prior Inpatient Therapy: Prior Inpatient Therapy Prior Inpatient Therapy: Yes Prior Therapy Dates: per pt's report 3-4 yrs ago  Prior Therapy Facilty/Provider(s): [facility] ?? Reason for Treatment: Depression  Prior Outpatient Therapy: Prior Outpatient Therapy Prior Outpatient Therapy: Yes Prior Therapy Dates: Current Provider Prior Therapy Facilty/Provider(s): [facility] Reason for Treatment: Psychiatry-Meds-"sleeping pills and nerve pills"  Additional Information: Additional Information 1:1 In Past 12 Months?: No CIRT Risk: No Elopement Risk: No Does patient have medical clearance?:  Yes                  Objective: Blood pressure 117/74, pulse 81, temperature 98.2 F (36.8 C), temperature source Oral, resp. rate 20, height '6\' 2"'  (1.88 m), weight 93.895 kg (207 lb), SpO2 98.00%.Body mass index is 26.57 kg/(m^2). Results for orders placed during the hospital encounter of 01/26/14 (from the past 72 hour(s))  CBC WITH DIFFERENTIAL     Status: None   Collection Time    01/26/14  4:05 PM      Result Value Ref Range   WBC 8.3  4.0 - 10.5 K/uL   RBC 5.13  4.22 - 5.81 MIL/uL   Hemoglobin 16.4  13.0 - 17.0 g/dL   HCT 46.5  39.0 - 52.0 %   MCV 90.6  78.0 - 100.0 fL   MCH 32.0  26.0 - 34.0 pg   MCHC 35.3  30.0 - 36.0 g/dL   RDW 12.8  11.5 - 15.5 %   Platelets 283  150 - 400 K/uL   Neutrophils Relative % 54  43 - 77 %   Neutro Abs 4.5  1.7 - 7.7 K/uL   Lymphocytes Relative 35  12 - 46 %    Lymphs Abs 2.9  0.7 - 4.0 K/uL   Monocytes Relative 9  3 - 12 %   Monocytes Absolute 0.8  0.1 - 1.0 K/uL   Eosinophils Relative 1  0 - 5 %   Eosinophils Absolute 0.1  0.0 - 0.7 K/uL   Basophils Relative 1  0 - 1 %   Basophils Absolute 0.1  0.0 - 0.1 K/uL  BASIC METABOLIC PANEL     Status: Abnormal   Collection Time    01/26/14  4:05 PM      Result Value Ref Range   Sodium 138  137 - 147 mEq/L   Potassium 3.8  3.7 - 5.3 mEq/L   Chloride 98  96 - 112 mEq/L   CO2 29  19 - 32 mEq/L   Glucose, Bld 243 (*) 70 - 99 mg/dL   BUN 7  6 - 23 mg/dL   Creatinine, Ser 0.84  0.50 - 1.35 mg/dL   Calcium 9.5  8.4 - 10.5 mg/dL   GFR calc non Af Amer >90  >90 mL/min   GFR calc Af Amer >90  >90 mL/min   Comment: (NOTE)     The eGFR has been calculated using the CKD EPI equation.     This calculation has not been validated in all clinical situations.     eGFR's persistently <90 mL/min signify possible Chronic Kidney     Disease.  ETHANOL     Status: None   Collection Time    01/26/14  4:05 PM      Result Value Ref Range   Alcohol, Ethyl (B) <11  0 - 11 mg/dL   Comment:            LOWEST DETECTABLE LIMIT FOR     SERUM ALCOHOL IS 11 mg/dL     FOR MEDICAL PURPOSES ONLY  URINE RAPID DRUG SCREEN (HOSP PERFORMED)     Status: None   Collection Time    01/26/14  4:18 PM      Result Value Ref Range   Opiates NONE DETECTED  NONE DETECTED   Cocaine NONE DETECTED  NONE DETECTED   Benzodiazepines NONE DETECTED  NONE DETECTED   Amphetamines NONE DETECTED  NONE DETECTED   Tetrahydrocannabinol NONE DETECTED  NONE  DETECTED   Barbiturates NONE DETECTED  NONE DETECTED   Comment:            DRUG SCREEN FOR MEDICAL PURPOSES     ONLY.  IF CONFIRMATION IS NEEDED     FOR ANY PURPOSE, NOTIFY LAB     WITHIN 5 DAYS.                LOWEST DETECTABLE LIMITS     FOR URINE DRUG SCREEN     Drug Class       Cutoff (ng/mL)     Amphetamine      1000     Barbiturate      200     Benzodiazepine   219     Tricyclics        758     Opiates          300     Cocaine          300     THC              50  URINALYSIS, ROUTINE W REFLEX MICROSCOPIC     Status: Abnormal   Collection Time    01/26/14  4:18 PM      Result Value Ref Range   Color, Urine YELLOW  YELLOW   APPearance CLEAR  CLEAR   Specific Gravity, Urine 1.010  1.005 - 1.030   pH 5.5  5.0 - 8.0   Glucose, UA 500 (*) NEGATIVE mg/dL   Hgb urine dipstick NEGATIVE  NEGATIVE   Bilirubin Urine NEGATIVE  NEGATIVE   Ketones, ur NEGATIVE  NEGATIVE mg/dL   Protein, ur NEGATIVE  NEGATIVE mg/dL   Urobilinogen, UA 0.2  0.0 - 1.0 mg/dL   Nitrite NEGATIVE  NEGATIVE   Leukocytes, UA NEGATIVE  NEGATIVE   Comment: MICROSCOPIC NOT DONE ON URINES WITH NEGATIVE PROTEIN, BLOOD, LEUKOCYTES, NITRITE, OR GLUCOSE <1000 mg/dL.  CBG MONITORING, ED     Status: Abnormal   Collection Time    01/26/14 10:06 PM      Result Value Ref Range   Glucose-Capillary 187 (*) 70 - 99 mg/dL  CBG MONITORING, ED     Status: Abnormal   Collection Time    01/27/14  7:26 AM      Result Value Ref Range   Glucose-Capillary 170 (*) 70 - 99 mg/dL  CBG MONITORING, ED     Status: Abnormal   Collection Time    01/27/14 12:19 PM      Result Value Ref Range   Glucose-Capillary 165 (*) 70 - 99 mg/dL   Comment 1 Notify RN     Comment 2 Documented in Chart    CBG MONITORING, ED     Status: Abnormal   Collection Time    01/27/14  5:07 PM      Result Value Ref Range   Glucose-Capillary 180 (*) 70 - 99 mg/dL   Comment 1 Documented in Chart     Labs are reviewed and are pertinent for Glucose 243.   Current Facility-Administered Medications  Medication Dose Route Frequency Provider Last Rate Last Dose  . acetaminophen (TYLENOL) tablet 650 mg  650 mg Oral Q4H PRN Janice Norrie, MD      . alum & mag hydroxide-simeth (MAALOX/MYLANTA) 200-200-20 MG/5ML suspension 30 mL  30 mL Oral PRN Janice Norrie, MD      . aspirin EC tablet 81 mg  81 mg Oral Daily Janice Norrie, MD   [phone]  mg at 01/27/14 1008  .  Canagliflozin TABS 150 mg  150 mg Oral BID WC Janice Norrie, MD   150 mg at 01/27/14 1708   And  . metFORMIN (GLUCOPHAGE) tablet 1,000 mg  1,000 mg Oral BID WC Janice Norrie, MD   1,000 mg at 01/27/14 1709  . gabapentin (NEURONTIN) capsule 400 mg  400 mg Oral TID Janice Norrie, MD   400 mg at 01/27/14 1708  . ibuprofen (ADVIL,MOTRIN) tablet 600 mg  600 mg Oral Q8H PRN Janice Norrie, MD      . insulin glargine (LANTUS) injection 20 Units  20 Units Subcutaneous QHS Janice Norrie, MD   20 Units at 01/26/14 2209  . LORazepam (ATIVAN) tablet 1 mg  1 mg Oral Q8H PRN Janice Norrie, MD      . metoprolol succinate (TOPROL-XL) 24 hr tablet 25 mg  25 mg Oral Daily Janice Norrie, MD   25 mg at 01/27/14 1009  . nicotine (NICODERM CQ - dosed in mg/24 hours) patch 21 mg  21 mg Transdermal Daily Janice Norrie, MD   21 mg at 01/26/14 1748  . ondansetron (ZOFRAN) tablet 4 mg  4 mg Oral Q8H PRN Janice Norrie, MD      . pantoprazole (PROTONIX) EC tablet 80 mg  80 mg Oral Daily Janice Norrie, MD   80 mg at 01/27/14 1007  . sertraline (ZOLOFT) tablet 100 mg  100 mg Oral Daily Janice Norrie, MD   100 mg at 01/27/14 1008  . tamsulosin (FLOMAX) capsule 0.4 mg  0.4 mg Oral QHS Janice Norrie, MD   0.4 mg at 01/26/14 2209  . traZODone (DESYREL) tablet 150 mg  150 mg Oral QHS Janice Norrie, MD   150 mg at 01/26/14 2208  . Vitamin D (Ergocalciferol) (DRISDOL) capsule 50,000 Units  50,000 Units Oral Q7 days Janice Norrie, MD   50,000 Units at 01/27/14 1009   Current Outpatient Prescriptions  Medication Sig Dispense Refill  . albuterol (PROVENTIL HFA;VENTOLIN HFA) 108 (90 BASE) MCG/ACT inhaler Inhale 2 puffs into the lungs every 6 (six) hours as needed for wheezing.  1 Inhaler  2  . aspirin EC 81 MG tablet Take 81 mg by mouth daily.      [name] atorvastatin (LIPITOR) 80 MG tablet Take 1 tablet (80 mg total) by mouth daily.  90 tablet  3  . Canagliflozin-Metformin HCl (INVOKAMET) [phone] MG TABS Take 1 tablet by mouth 2 (two) times daily with a meal.  60  tablet  0  . gabapentin (NEURONTIN) 400 MG capsule Take 1 capsule (400 mg total) by mouth 3 (three) times daily.  90 capsule  3  . Insulin Glargine (LANTUS SOLOSTAR) 100 UNIT/ML Solostar Pen Inject 20 Units into the skin at bedtime.  15 mL  2  . nitroGLYCERIN (NITROSTAT) 0.4 MG SL tablet Place 1 tablet (0.4 mg total) under the tongue every 5 (five) minutes as needed. For chest pain.  25 tablet  3  . omeprazole (PRILOSEC) 40 MG capsule Take 1 capsule (40 mg total) by mouth 2 (two) times daily.  60 capsule  5  . sertraline (ZOLOFT) 100 MG tablet Take 100 mg by mouth daily.      . tamsulosin (FLOMAX) 0.4 MG CAPS capsule Take 1 capsule (0.4 mg total) by mouth daily.  30 capsule  3  . traZODone (DESYREL) 150 MG tablet Take 150 mg by mouth at bedtime.      . [  DISCONTINUED] metoprolol tartrate (LOPRESSOR) 25 MG tablet Take 25 mg by mouth 2 (two) times daily.          Psychiatric Specialty Exam:     Blood pressure 117/74, pulse 81, temperature 98.2 F (36.8 C), temperature source Oral, resp. rate 20, height '6\' 2"'  (1.88 m), weight 93.895 kg (207 lb), SpO2 98.00%.Body mass index is 26.57 kg/(m^2).  General Appearance: Casual  Eye Contact::  Good  Speech:  Clear and Coherent  Volume:  Normal  Mood:  Depressed  Affect:  Appropriate and Congruent  Thought Process:  Coherent  Orientation:  Full (Time, Place, and Person)  Thought Content:  WDL  Suicidal Thoughts:  No  Homicidal Thoughts:  No  Memory:  Immediate;   Good Recent;   Good Remote;   Good  Judgement:  Fair  Insight:  Fair  Psychomotor Activity:  Normal  Concentration:  Good  Recall:  Good  Akathisia:  NA  Handed:  Right  AIMS (if indicated):     Assets:  Communication Skills Desire for Improvement Resilience Social Support  Sleep:      Treatment Plan Summary: -Discharge home with outpatient referrals.  *Case discussed with Dr. Dwyane Dee.  Disposition: Disposition Initial Assessment Completed for this Encounter:  Yes Disposition of Patient: Other dispositions Other disposition(s): Other (Comment)   Benjamine Mola, FNP-BC 01/27/2014 8:08 PM

## 2014-01-28 NOTE — Telephone Encounter (Signed)
Patient thinks that Invokamet may have had something to do with his recent increased depression.  He has stopped Invokamet over the last few days.   He would like to restart metformin which I told him is OK.   He has follow up with me tomorrow.

## 2014-01-29 ENCOUNTER — Ambulatory Visit (INDEPENDENT_AMBULATORY_CARE_PROVIDER_SITE_OTHER): Payer: Medicare Other | Admitting: Pharmacist

## 2014-01-29 ENCOUNTER — Encounter: Payer: Self-pay | Admitting: Pharmacist

## 2014-01-29 VITALS — BP 134/74 | HR 82 | Ht 74.0 in | Wt 200.0 lb

## 2014-01-29 DIAGNOSIS — I10 Essential (primary) hypertension: Secondary | ICD-10-CM

## 2014-01-29 DIAGNOSIS — E119 Type 2 diabetes mellitus without complications: Secondary | ICD-10-CM

## 2014-01-29 MED ORDER — INSULIN GLARGINE 100 UNIT/ML SOLOSTAR PEN: 40.0000 [IU] | PEN_INJECTOR | Freq: Every day | SUBCUTANEOUS | Status: DC

## 2014-01-29 MED ORDER — LINAGLIPTIN 5 MG PO TABS: 5.0000 mg | ORAL_TABLET | Freq: Every day | ORAL | Status: DC

## 2014-01-29 NOTE — Consult Note (Signed)
Case discussed, and agree with plan

## 2014-01-29 NOTE — Progress Notes (Signed)
Diabetes Follow-Up Visit Chief Complaint:   Chief Complaint  Patient presents with  . Diabetes     Filed Vitals:   01/29/14 1055  BP: 134/74  Pulse: 82   Filed Weights   01/29/14 1055  Weight: 200 lb (90.719 kg)     HPI: Saw patient about 1 month ago and started Invokamet.  He recently stopped Invokamet and restarted metformin because he felt the Invokamet might have lead to his depressed mood.  He recently spend 1 [facility] at [facility] due to suicidal ideation.  He has follow up with Dr Hoyle Barr at [facility].  Today he denies any thought of harming self or others and his mood is much better.   BG in hospital was 165 - 187.  This am BG was 298.      Current Diabetes Medications:  Lantus 30 units daily and metformin 1000mg  bid  Home BG Monitoring:  Checking 1-2 times a day.  Low fat/carbohydrate diet?  Yes Nicotine Abuse?  Yes Medication Compliance?  Yes Exercise?  No  Exam Edema:  negative  Polyuria:  negative  Polydipsia:  negative Polyphagia:  negative  BMI:  Body mass index is 25.67 kg/(m^2).   Weight changes:  Decreased by about 7 lbs General Appearance:  alert, oriented, no acute distress and well nourished Mood/Affect:  normal   Lab Results  Component Value Date   HGBA1C 10.4 12/10/2013    No results found for this basenameDerl Barrow    Lab Results  Component Value Date   CHOL 149 05/07/2013   HDL 43 12/10/2013   LDLCALC 77 12/10/2013   TRIG 207* 12/10/2013   CHOLHDL 3.7 12/10/2013      Assessment: 1.  Diabetes.  Uncontrolled - not at goals but improving 2.  Blood Pressure.  At goal 3.  Lipids.  Elevated Tg likely due to uncontrolled DM and diet  Recommendations: 1.  Medication recommendations at this time are as follows:    Increase lantus to 35 units daily (after 5 days if FBG still greater than 150 will increase to 40 units daily) - monitor for hypoglycemia - reviewed hypoglycemia s/s and how to treat  Continue metformin 1000mg  bid  Add Tradgenta 5mg   1 tablet daily 2.  Reviewed HBG goals:  Fasting 80-130 and 1-2 hour post prandial <180.  Patient is instructed to check BG 2 times per day.    3.  BP goal < 140/85. 4.  LDL goal of < 100, HDL > 40 and TG < 150 5.  Physical Activity recommendations:  Rsestart walking daily   6.  Return to clinic in 4-6 wks   Time spent counseling patient:  30 minutes  Referring provider:  Roosvelt Harps, PharmD, CPP

## 2014-02-19 ENCOUNTER — Ambulatory Visit (INDEPENDENT_AMBULATORY_CARE_PROVIDER_SITE_OTHER): Payer: Medicare Other | Admitting: Pharmacist

## 2014-02-19 ENCOUNTER — Encounter: Payer: Self-pay | Admitting: Pharmacist

## 2014-02-19 VITALS — BP 124/84 | HR 78 | Ht 74.0 in | Wt 199.8 lb

## 2014-02-19 DIAGNOSIS — N400 Enlarged prostate without lower urinary tract symptoms: Secondary | ICD-10-CM

## 2014-02-19 DIAGNOSIS — K219 Gastro-esophageal reflux disease without esophagitis: Secondary | ICD-10-CM

## 2014-02-19 DIAGNOSIS — G629 Polyneuropathy, unspecified: Secondary | ICD-10-CM

## 2014-02-19 DIAGNOSIS — E119 Type 2 diabetes mellitus without complications: Secondary | ICD-10-CM

## 2014-02-19 DIAGNOSIS — E785 Hyperlipidemia, unspecified: Secondary | ICD-10-CM

## 2014-02-19 MED ORDER — BLOOD GLUCOSE TEST VI STRP
ORAL_STRIP | Status: DC
Start: 2014-02-19 — End: 2014-06-14

## 2014-02-19 MED ORDER — ATORVASTATIN CALCIUM 80 MG PO TABS: 80.0000 mg | ORAL_TABLET | Freq: Every day | ORAL | Status: DC

## 2014-02-19 MED ORDER — LINAGLIPTIN-METFORMIN HCL 2.5-1000 MG PO TABS: 1.0000 | ORAL_TABLET | Freq: Two times a day (BID) | ORAL | Status: DC

## 2014-02-19 MED ORDER — OMEPRAZOLE 40 MG PO CPDR: 40.0000 mg | DELAYED_RELEASE_CAPSULE | Freq: Two times a day (BID) | ORAL | Status: DC

## 2014-02-19 MED ORDER — TAMSULOSIN HCL 0.4 MG PO CAPS
0.4000 mg | ORAL_CAPSULE | Freq: Every day | ORAL | Status: DC
Start: 2014-02-19 — End: 2014-06-14

## 2014-02-19 MED ORDER — GABAPENTIN 400 MG PO CAPS: 400.0000 mg | ORAL_CAPSULE | Freq: Three times a day (TID) | ORAL | Status: DC

## 2014-02-19 NOTE — Progress Notes (Signed)
Diabetes Follow-Up Visit Chief Complaint:   Chief Complaint  Patient presents with  . Diabetes     Filed Vitals:   02/19/14 1013  BP: 124/84  Pulse: 78   Filed Weights   02/19/14 1013  Weight: 199 lb 12 oz (90.606 kg)     HPI:  Saw patient about 1 month ago and started [place].  He has tolerated well but HBG is still above recommended goals  Current Diabetes Medications:  Lantus 40 units daily, Tradgenta 5mg  1 tablet daily and metformin 1000mg  bid (I am not sure patient has been compliant with metformin - he brought in all his other medications but metformin was missing - he thinks he may have just forgotten at home)  Home BG Monitoring:  Checking 1-2 times a day. Lowest HBG reading - 140 Highest HBG reading - 284  Low fat/carbohydrate diet?  Yes Nicotine Abuse?  Yes Medication Compliance?  Yes Exercise?  No  Exam Edema:  negative  Polyuria:  negative  Polydipsia:  negative Polyphagia:  negative  BMI:  Body mass index is 25.64 kg/(m^2).   Weight changes:  Stable General Appearance:  alert, oriented, no acute distress and well nourished Mood/Affect:  normal   Lab Results  Component Value Date   HGBA1C 10.4 12/10/2013    No results found for this basenameDerl Barrow    Lab Results  Component Value Date   CHOL 149 05/07/2013   HDL 43 12/10/2013   LDLCALC 77 12/10/2013   TRIG 207* 12/10/2013   CHOLHDL 3.7 12/10/2013      Assessment: 1.  Diabetes.  Uncontrolled - not at goals but improving 2.  Blood Pressure.  At goal 3.  Lipids.  Elevated Tg likely due to uncontrolled DM and diet  Recommendations: 1.  Medication recommendations at this time are as follows:    Increase lantus to 45 units daily   Will combine metformin and tradjenta - start jentodueto 2.5/1000mg  take 1 tablet bid with food. 2.  Reviewed HBG goals:  Fasting 80-130 and 1-2 hour post prandial <180.  Patient is instructed to check BG 2 times per day.    3.  BP goal < 140/85. 4.  LDL goal  of < 100, HDL > 40 and TG < 150 5.  Physical Activity recommendations:  Restart walking daily   6.  Return to clinic in 1 month to see PCP, July to follow up with clinical pharmacist   Time spent counseling patient:  20 minutes  Referring provider:  Roosvelt Harps, PharmD, CPP

## 2014-02-19 NOTE — Patient Instructions (Signed)
Stop metformin and Tradgenta Changed to Jentadueto 2.5/1000mg  (combonation of metformin and Tradgenta) - take 1 tablet twice a day  Increase Lantus to 45 units once a day

## 2014-03-18 ENCOUNTER — Telehealth: Payer: Self-pay | Admitting: Pharmacist

## 2014-03-18 MED ORDER — GLUCOSE BLOOD VI STRP: ORAL_STRIP | Status: DC

## 2014-03-18 MED ORDER — ONETOUCH LANCETS MISC: Status: DC

## 2014-03-18 MED ORDER — ONETOUCH BASIC SYSTEM W/DEVICE KIT: PACK | Status: DC

## 2014-03-18 NOTE — Telephone Encounter (Signed)
done

## 2014-03-23 ENCOUNTER — Other Ambulatory Visit: Payer: Self-pay | Admitting: Internal Medicine

## 2014-03-26 ENCOUNTER — Telehealth: Payer: Self-pay | Admitting: Pharmacist

## 2014-03-26 MED ORDER — ALBUTEROL SULFATE HFA 108 (90 BASE) MCG/ACT IN AERS: 2.0000 | INHALATION_SPRAY | Freq: Four times a day (QID) | RESPIRATORY_TRACT | Status: DC | PRN

## 2014-03-26 MED ORDER — MOMETASONE FURO-FORMOTEROL FUM 100-5 MCG/ACT IN AERO: 2.0000 | INHALATION_SPRAY | Freq: Two times a day (BID) | RESPIRATORY_TRACT | Status: DC

## 2014-03-26 NOTE — Telephone Encounter (Signed)
Called patient to see which inhalers he is in need of.  He states he needs albuterol and Dulera.

## 2014-03-26 NOTE — Telephone Encounter (Signed)
Albuterol and Dulera rx sent to [facility] -  Patient scheduled to follow up with Stevan Born 03/30/14

## 2014-03-30 ENCOUNTER — Ambulatory Visit (INDEPENDENT_AMBULATORY_CARE_PROVIDER_SITE_OTHER): Payer: Medicare Other | Admitting: Family Medicine

## 2014-03-30 ENCOUNTER — Telehealth: Payer: Self-pay | Admitting: *Deleted

## 2014-03-30 ENCOUNTER — Ambulatory Visit (INDEPENDENT_AMBULATORY_CARE_PROVIDER_SITE_OTHER): Payer: Medicare Other

## 2014-03-30 ENCOUNTER — Encounter: Payer: Self-pay | Admitting: Family Medicine

## 2014-03-30 VITALS — BP 118/85 | HR 96 | Temp 98.7°F | Ht 74.0 in | Wt 198.0 lb

## 2014-03-30 DIAGNOSIS — R05 Cough: Secondary | ICD-10-CM

## 2014-03-30 DIAGNOSIS — R059 Cough, unspecified: Secondary | ICD-10-CM

## 2014-03-30 DIAGNOSIS — F17200 Nicotine dependence, unspecified, uncomplicated: Secondary | ICD-10-CM

## 2014-03-30 DIAGNOSIS — E119 Type 2 diabetes mellitus without complications: Secondary | ICD-10-CM

## 2014-03-30 DIAGNOSIS — Z720 Tobacco use: Secondary | ICD-10-CM

## 2014-03-30 DIAGNOSIS — E785 Hyperlipidemia, unspecified: Secondary | ICD-10-CM

## 2014-03-30 LAB — POCT CBC
Granulocyte percent: 64.8 %G (ref 37–80)
HCT, POC: 52.8 % (ref 43.5–53.7)
Hemoglobin: 17.7 g/dL (ref 14.1–18.1)
Lymph, poc: 2.2 (ref 0.6–3.4)
MCH, POC: 31.8 pg — AB (ref 27–31.2)
MCHC: 33.6 g/dL (ref 31.8–35.4)
MCV: 94.7 fL (ref 80–97)
MPV: 8.5 fL (ref 0–99.8)
POC Granulocyte: 4.9 (ref 2–6.9)
POC LYMPH PERCENT: 29.3 %L (ref 10–50)
Platelet Count, POC: 264 10*3/uL (ref 142–424)
RBC: 5.6 M/uL (ref 4.69–6.13)
RDW, POC: 12.8 %
WBC: 7.5 10*3/uL (ref 4.6–10.2)

## 2014-03-30 LAB — GLUCOSE, POCT (MANUAL RESULT ENTRY): POC Glucose: 300 mg/dl — AB (ref 70–99)

## 2014-03-30 LAB — POCT GLYCOSYLATED HEMOGLOBIN (HGB A1C): Hemoglobin A1C: >14

## 2014-03-30 MED ORDER — ATORVASTATIN CALCIUM 80 MG PO TABS: 80.0000 mg | ORAL_TABLET | Freq: Every day | ORAL | Status: DC

## 2014-03-30 MED ORDER — ALBUTEROL SULFATE HFA 108 (90 BASE) MCG/ACT IN AERS: 2.0000 | INHALATION_SPRAY | Freq: Four times a day (QID) | RESPIRATORY_TRACT | Status: DC | PRN

## 2014-03-30 NOTE — Progress Notes (Signed)
   Subjective:    Patient ID: Angeldejesus D Hathaway, male    DOB: 12/10/1965, 47 y.o.   MRN: [id number]  HPI This 47 y.o. male presents for evaluation of follow up on diabetes, hyperlipidemia, and hypertension. He has no acute complaints.  He is due for labs and needs refills.  He is feeling better since losing Weight.  He has been seen in 4/15 for depression and is better and sees psychiatry.  He Is due for labs.  He was seeing Tammy Eckerd for diabetes and this is doing better now.   Review of Systems No chest pain, SOB, HA, dizziness, vision change, N/V, diarrhea, constipation, dysuria, urinary urgency or frequency, myalgias, arthralgias or rash.     Objective:   Physical Exam  Vital signs noted  Well developed well nourished male.  HEENT - Head atraumatic Normocephalic                Eyes - PERRLA, Conjuctiva - clear Sclera- Clear EOMI                Ears - EAC's Wnl TM's Wnl Gross Hearing WNL                Nose - Nares patent                 Throat - oropharanx wnl Respiratory - Lungs CTA bilateral Cardiac - RRR S1 and S2 without murmur GI - Abdomen soft Nontender and bowel sounds active x 4 Extremities - No edema. Neuro - Grossly intact.      Assessment & Plan:  Cough - Plan: albuterol (PROVENTIL HFA;VENTOLIN HFA) 108 (90 BASE) MCG/ACT inhaler, DG Chest 2 View  Tobacco abuse - Plan: DG Chest 2 View  Type II or unspecified type diabetes mellitus without mention of complication, not stated as uncontrolled - Plan: POCT CBC, POCT glycosylated hemoglobin (Hb A1C), CMP14+EGFR  Other and unspecified hyperlipidemia - Plan: atorvastatin (LIPITOR) 80 MG tablet  William J Oxford FNP 

## 2014-03-30 NOTE — Telephone Encounter (Signed)
Script for Atorvastatin 80mg  ,qty 90, 1 refill left on VM.

## 2014-03-30 NOTE — Addendum Note (Signed)
Addended by: Selmer Dominion on: 03/30/2014 12:12 PM   Modules accepted: Orders

## 2014-03-31 LAB — CMP14+EGFR
ALT: 14 IU/L (ref 0–44)
AST: 12 IU/L (ref 0–40)
Albumin/Globulin Ratio: 2.2 (ref 1.1–2.5)
Albumin: 4.7 g/dL (ref 3.5–5.5)
Alkaline Phosphatase: 73 IU/L (ref 39–117)
BUN/Creatinine Ratio: 12 (ref 9–20)
BUN: 11 mg/dL (ref 6–24)
CO2: 23 mmol/L (ref 18–29)
Calcium: 10 mg/dL (ref 8.7–10.2)
Chloride: 94 mmol/L — ABNORMAL LOW (ref 97–108)
Creatinine, Ser: 0.92 mg/dL (ref 0.76–1.27)
GFR calc Af Amer: 113 mL/min/{1.73_m2} (ref >59)
GFR calc non Af Amer: 98 mL/min/{1.73_m2} (ref >59)
Globulin, Total: 2.1 g/dL (ref 1.5–4.5)
Glucose: 274 mg/dL — ABNORMAL HIGH (ref 65–99)
Potassium: 4.3 mmol/L (ref 3.5–5.2)
Sodium: 137 mmol/L (ref 134–144)
Total Bilirubin: 0.4 mg/dL (ref 0.0–1.2)
Total Protein: 6.8 g/dL (ref 6.0–8.5)

## 2014-04-01 ENCOUNTER — Other Ambulatory Visit: Payer: Self-pay | Admitting: Family Medicine

## 2014-04-06 ENCOUNTER — Other Ambulatory Visit: Payer: Self-pay | Admitting: Family Medicine

## 2014-04-14 ENCOUNTER — Ambulatory Visit: Payer: Self-pay | Admitting: Family Medicine

## 2014-05-07 ENCOUNTER — Ambulatory Visit: Payer: Self-pay

## 2014-05-13 ENCOUNTER — Encounter: Payer: Self-pay | Admitting: Pharmacist

## 2014-05-13 ENCOUNTER — Ambulatory Visit (INDEPENDENT_AMBULATORY_CARE_PROVIDER_SITE_OTHER): Payer: Medicare Other | Admitting: Pharmacist

## 2014-05-13 VITALS — BP 124/78 | HR 82 | Ht 73.5 in | Wt 199.0 lb

## 2014-05-13 DIAGNOSIS — Z794 Long term (current) use of insulin: Secondary | ICD-10-CM

## 2014-05-13 DIAGNOSIS — Z0000 Encounter for general adult medical examination without abnormal findings: Secondary | ICD-10-CM

## 2014-05-13 DIAGNOSIS — E119 Type 2 diabetes mellitus without complications: Secondary | ICD-10-CM

## 2014-05-13 LAB — POCT GLYCOSYLATED HEMOGLOBIN (HGB A1C)

## 2014-05-13 MED ORDER — INSULIN ASPART 100 UNIT/ML FLEXPEN: PEN_INJECTOR | SUBCUTANEOUS | Status: DC

## 2014-05-13 MED ORDER — INSULIN GLARGINE 100 UNIT/ML SOLOSTAR PEN: 55.0000 [IU] | PEN_INJECTOR | Freq: Every day | SUBCUTANEOUS | Status: DC

## 2014-05-13 NOTE — Patient Instructions (Signed)
Increase Lantus to 55 units once a day  Novolog Insulin (orange box)  Check blood glucose prior to each meal.  Inject Novolog per siding scale below  70 or below = too low (eat something to increase blood glucose / sugar) 71 to 150 = no insulin 151 to 200 = 2 units 201 to 250 = 3 units 251 to 300 = 5 units 301 or higher = 7 units  Call office for insulin adjustment if you have 2 or more blood glucose / sugar reading over 250 or less than 70 with in a week. Cherre Robins [phone]     Preventive Care for Adults A healthy lifestyle and preventive care can promote health and wellness. Preventive health guidelines for men include the following key practices:  A routine yearly physical is a good way to check with your health care provider about your health and preventative screening. It is a chance to share any concerns and updates on your health and to receive a thorough exam.  Visit your dentist for a routine exam and preventative care every 6 months. Brush your teeth twice a day and floss once a day. Good oral hygiene prevents tooth decay and gum disease.  The frequency of eye exams is based on your age, health, family medical history, use of contact lenses, and other factors. Follow your health care provider's recommendations for frequency of eye exams.  Eat a healthy diet. Foods such as vegetables, fruits, whole grains, low-fat dairy products, and lean protein foods contain the nutrients you need without too many calories. Decrease your intake of foods high in solid fats, added sugars, and salt. Eat the right amount of calories for you.Get information about a proper diet from your health care provider, if necessary.  Regular physical exercise is one of the most important things you can do for your health. Most adults should get at least 150 minutes of moderate-intensity exercise (any activity that increases your heart rate and causes you to sweat) each week. In addition, most adults need  muscle-strengthening exercises on 2 or more days a week.  Maintain a healthy weight. The body mass index (BMI) is a screening tool to identify possible weight problems. It provides an estimate of body fat based on height and weight. Your health care provider can find your BMI and can help you achieve or maintain a healthy weight.For adults 20 years and older:  A BMI below 18.5 is considered underweight.  A BMI of 18.5 to 24.9 is normal.  A BMI of 25 to 29.9 is considered overweight.  A BMI of 30 and above is considered obese.  Maintain normal blood lipids and cholesterol levels by exercising and minimizing your intake of saturated fat. Eat a balanced diet with plenty of fruit and vegetables. Blood tests for lipids and cholesterol should begin at age 98 and be repeated every 5 years. If your lipid or cholesterol levels are high, you are over 50, or you are at high risk for heart disease, you may need your cholesterol levels checked more frequently.Ongoing high lipid and cholesterol levels should be treated with medicines if diet and exercise are not working.  If you smoke, find out from your health care provider how to quit. If you do not use tobacco, do not start.  Lung cancer screening is recommended for adults aged 33-80 years who are at high risk for developing lung cancer because of a history of smoking. A yearly low-dose CT scan of the lungs is recommended for  people who have at least a 30-[place]-year history of smoking and are a current smoker or have quit within the past 15 years. A [place] year of smoking is smoking an average of 1 Leeth of cigarettes a day for 1 year (for example: 1 Yoon a day for 30 years or 2 packs a day for 15 years). Yearly screening should continue until the smoker has stopped smoking for at least 15 years. Yearly screening should be stopped for people who develop a health problem that would prevent them from having lung cancer treatment.  If you choose to drink alcohol,  do not have more than 2 drinks per day. One drink is considered to be 12 ounces (355 mL) of beer, 5 ounces (148 mL) of wine, or 1.5 ounces (44 mL) of liquor.  Avoid use of street drugs. Do not share needles with anyone. Ask for help if you need support or instructions about stopping the use of drugs.  High blood pressure causes heart disease and increases the risk of stroke. Your blood pressure should be checked at least every 1-2 years. Ongoing high blood pressure should be treated with medicines, if weight loss and exercise are not effective.  If you are 11-32 years old, ask your health care provider if you should take aspirin to prevent heart disease.  Diabetes screening involves taking a blood sample to check your fasting blood sugar level. This should be done once every 3 years, after age 48, if you are within normal weight and without risk factors for diabetes. Testing should be considered at a younger age or be carried out more frequently if you are overweight and have at least 1 risk factor for diabetes.  Colorectal cancer can be detected and often prevented. Most routine colorectal cancer screening begins at the age of 39 and continues through age 72. However, your health care provider may recommend screening at an earlier age if you have risk factors for colon cancer. On a yearly basis, your health care provider may provide home test kits to check for hidden blood in the stool. Use of a small camera at the end of a tube to directly examine the colon (sigmoidoscopy or colonoscopy) can detect the earliest forms of colorectal cancer. Talk to your health care provider about this at age 40, when routine screening begins. Direct exam of the colon should be repeated every 5-10 years through age 25, unless early forms of precancerous polyps or small growths are found.  People who are at an increased risk for hepatitis B should be screened for this virus. You are considered at high risk for hepatitis B  if:  You were born in a country where hepatitis B occurs often. Talk with your health care provider about which countries are considered high risk.  Your parents were born in a high-risk country and you have not received a shot to protect against hepatitis B (hepatitis B vaccine).  You have HIV or AIDS.  You use needles to inject street drugs.  You live with, or have sex with, someone who has hepatitis B.  You are a man who has sex with other men (MSM).  You get hemodialysis treatment.  You take certain medicines for conditions such as cancer, organ transplantation, and autoimmune conditions.  Hepatitis C blood testing is recommended for all people born from [birth year] through 1965 and any individual with known risks for hepatitis C.  Practice safe sex. Use condoms and avoid high-risk sexual practices to reduce the spread of  sexually transmitted infections (STIs). STIs include gonorrhea, chlamydia, syphilis, trichomonas, herpes, HPV, and human immunodeficiency virus (HIV). Herpes, HIV, and HPV are viral illnesses that have no cure. They can result in disability, cancer, and death.  If you are at risk of being infected with HIV, it is recommended that you take a prescription medicine daily to prevent HIV infection. This is called preexposure prophylaxis (PrEP). You are considered at risk if:  You are a man who has sex with other men (MSM) and have other risk factors.  You are a heterosexual man, are sexually active, and are at increased risk for HIV infection.  You take drugs by injection.  You are sexually active with a partner who has HIV.  Talk with your health care provider about whether you are at high risk of being infected with HIV. If you choose to begin PrEP, you should first be tested for HIV. You should then be tested every 3 months for as long as you are taking PrEP.  A one-time screening for abdominal aortic aneurysm (AAA) and surgical repair of large AAAs by ultrasound are  recommended for men ages 66 to 59 years who are current or former smokers.  Healthy men should no longer receive prostate-specific antigen (PSA) blood tests as part of routine cancer screening. Talk with your health care provider about prostate cancer screening.  Testicular cancer screening is not recommended for adult males who have no symptoms. Screening includes self-exam, a health care provider exam, and other screening tests. Consult with your health care provider about any symptoms you have or any concerns you have about testicular cancer.  Use sunscreen. Apply sunscreen liberally and repeatedly throughout the day. You should seek shade when your shadow is shorter than you. Protect yourself by wearing long sleeves, pants, a wide-brimmed hat, and sunglasses year round, whenever you are outdoors.  Once a month, do a whole-body skin exam, using a mirror to look at the skin on your back. Tell your health care provider about new moles, moles that have irregular borders, moles that are larger than a pencil eraser, or moles that have changed in shape or color.  Stay current with required vaccines (immunizations).  Influenza vaccine. All adults should be immunized every year.  Tetanus, diphtheria, and acellular pertussis (Td, Tdap) vaccine. An adult who has not previously received Tdap or who does not know his vaccine status should receive 1 dose of Tdap. This initial dose should be followed by tetanus and diphtheria toxoids (Td) booster doses every 10 years. Adults with an unknown or incomplete history of completing a 3-dose immunization series with Td-containing vaccines should begin or complete a primary immunization series including a Tdap dose. Adults should receive a Td booster every 10 years.  Varicella vaccine. An adult without evidence of immunity to varicella should receive 2 doses or a second dose if he has previously received 1 dose.  Human papillomavirus (HPV) vaccine. Males aged 24-21  years who have not received the vaccine previously should receive the 3-dose series. Males aged 22-26 years may be immunized. Immunization is recommended through the age of 49 years for any male who has sex with males and did not get any or all doses earlier. Immunization is recommended for any person with an immunocompromised condition through the age of 47 years if he did not get any or all doses earlier. During the 3-dose series, the second dose should be obtained 4-8 weeks after the first dose. The third dose should be obtained 24  weeks after the first dose and 16 weeks after the second dose.  Zoster vaccine. One dose is recommended for adults aged 15 years or older unless certain conditions are present.  Measles, mumps, and rubella (MMR) vaccine. Adults born before [birth year] generally are considered immune to measles and mumps. Adults born in [birth year] or later should have 1 or more doses of MMR vaccine unless there is a contraindication to the vaccine or there is laboratory evidence of immunity to each of the three diseases. A routine second dose of MMR vaccine should be obtained at least 28 days after the first dose for students attending postsecondary schools, health care workers, or international travelers. People who received inactivated measles vaccine or an unknown type of measles vaccine during 1963-1967 should receive 2 doses of MMR vaccine. People who received inactivated mumps vaccine or an unknown type of mumps vaccine before 1979 and are at high risk for mumps infection should consider immunization with 2 doses of MMR vaccine. Unvaccinated health care workers born before [birth year] who lack laboratory evidence of measles, mumps, or rubella immunity or laboratory confirmation of disease should consider measles and mumps immunization with 2 doses of MMR vaccine or rubella immunization with 1 dose of MMR vaccine.  Pneumococcal 13-valent conjugate (PCV13) vaccine. When indicated, a person who is uncertain of his  immunization history and has no record of immunization should receive the PCV13 vaccine. An adult aged 3 years or older who has certain medical conditions and has not been previously immunized should receive 1 dose of PCV13 vaccine. This PCV13 should be followed with a dose of pneumococcal polysaccharide (PPSV23) vaccine. The PPSV23 vaccine dose should be obtained at least 8 weeks after the dose of PCV13 vaccine. An adult aged 74 years or older who has certain medical conditions and previously received 1 or more doses of PPSV23 vaccine should receive 1 dose of PCV13. The PCV13 vaccine dose should be obtained 1 or more years after the last PPSV23 vaccine dose.  Pneumococcal polysaccharide (PPSV23) vaccine. When PCV13 is also indicated, PCV13 should be obtained first. All adults aged 66 years and older should be immunized. An adult younger than age 29 years who has certain medical conditions should be immunized. Any person who resides in a nursing home or long-term care facility should be immunized. An adult smoker should be immunized. People with an immunocompromised condition and certain other conditions should receive both PCV13 and PPSV23 vaccines. People with human immunodeficiency virus (HIV) infection should be immunized as soon as possible after diagnosis. Immunization during chemotherapy or radiation therapy should be avoided. Routine use of PPSV23 vaccine is not recommended for American Indians, [place] Natives, or people younger than 65 years unless there are medical conditions that require PPSV23 vaccine. When indicated, people who have unknown immunization and have no record of immunization should receive PPSV23 vaccine. One-time revaccination 5 years after the first dose of PPSV23 is recommended for people aged 19-64 years who have chronic kidney failure, nephrotic syndrome, asplenia, or immunocompromised conditions. People who received 1-2 doses of PPSV23 before age 66 years should receive another  dose of PPSV23 vaccine at age 63 years or later if at least 5 years have passed since the previous dose. Doses of PPSV23 are not needed for people immunized with PPSV23 at or after age 82 years.  Meningococcal vaccine. Adults with asplenia or persistent complement component deficiencies should receive 2 doses of quadrivalent meningococcal conjugate (MenACWY-D) vaccine. The doses should be obtained at least 2 months apart.  Microbiologists working with certain meningococcal bacteria, [employer] recruits, people at risk during an outbreak, and people who travel to or live in countries with a high rate of meningitis should be immunized. A first-year college student up through age 74 years who is living in a residence hall should receive a dose if he did not receive a dose on or after his 16th birthday. Adults who have certain high-risk conditions should receive one or more doses of vaccine.  Hepatitis A vaccine. Adults who wish to be protected from this disease, have certain high-risk conditions, work with hepatitis A-infected animals, work in hepatitis A research labs, or travel to or work in countries with a high rate of hepatitis A should be immunized. Adults who were previously unvaccinated and who anticipate close contact with an international adoptee during the first 60 days after arrival in the [place] from a country with a high rate of hepatitis A should be immunized.  Hepatitis B vaccine. Adults should be immunized if they wish to be protected from this disease, have certain high-risk conditions, may be exposed to blood or other infectious body fluids, are household contacts or sex partners of hepatitis B positive people, are clients or workers in certain care facilities, or travel to or work in countries with a high rate of hepatitis B.  Haemophilus influenzae type b (Hib) vaccine. A previously unvaccinated person with asplenia or sickle cell disease or having a scheduled splenectomy should receive  1 dose of Hib vaccine. Regardless of previous immunization, a recipient of a hematopoietic stem cell transplant should receive a 3-dose series 6-12 months after his successful transplant. Hib vaccine is not recommended for adults with HIV infection.  Ages 81 to 76  Blood pressure check.** / Every 1 to 2 years.  Lipid and cholesterol check.** / Every 5 years beginning at age 33.  Lung cancer screening. / Every year if you are aged 36-80 years and have a 30-[place]-year history of smoking and currently smoke or have quit within the past 15 years. Yearly screening is stopped once you have quit smoking for at least 15 years or develop a health problem that would prevent you from having lung cancer treatment.  Fecal occult blood test (FOBT) of stool. / Every year beginning at age 67 and continuing until age 32. You may not have to do this test if you get a colonoscopy every 10 years.  Flexible sigmoidoscopy** or colonoscopy.** / Every 5 years for a flexible sigmoidoscopy or every 10 years for a colonoscopy beginning at age 3 and continuing until age 46.  Hepatitis C blood test.** / For all people born from [birth year] through 1965 and any individual with known risks for hepatitis C.  Skin self-exam. / Monthly.  Influenza vaccine. / Every year.  Tetanus, diphtheria, and acellular pertussis (Tdap/Td) vaccine.** / Consult your health care provider. 1 dose of Td every 10 years.  Varicella vaccine.** / Consult your health care provider.  Zoster vaccine.** / 1 dose for adults aged 28 years or older.  Measles, mumps, rubella (MMR) vaccine.** / You need at least 1 dose of MMR if you were born in 1957 or later. You may also need a second dose.  Pneumococcal 13-valent conjugate (PCV13) vaccine.** / Consult your health care provider.  Pneumococcal polysaccharide (PPSV23) vaccine.** / 1 to 2 doses if you smoke cigarettes or if you have certain conditions.  Meningococcal vaccine.** / Consult your health care  provider.  Hepatitis A vaccine.** / Consult your health care provider.  Hepatitis B vaccine.** / Consult your health care provider.  Haemophilus influenzae type b (Hib) vaccine.** / Consult your health care provider.

## 2014-05-13 NOTE — Progress Notes (Signed)
Patient ID: FED CECI, male   DOB: [birth year], 48 y.o.   MRN: [id number] Subjective:    Dustin Cruz is a 48 y.o. male who presents for Medicare Initial Wellness Visit and recheck uncontrolled diabetes   Patient stated BG has been variable over the last 3 weeks - from 130 to 400.  Current diabetes regimen is Lantus 45 units QD and Jentudueto 2.5/1000mg 1 tablet BID  Preventive Screening-Counseling & Management  Tobacco History  Smoking status  . Current Every Day Smoker -- 0.50 packs/day for 30 years  . Types: Cigarettes  . Start date: 10/09/1981  Smokeless tobacco  . Never Used   Current Problems (verified) Patient Active Problem List   Diagnosis Date Noted  . Vitamin D deficiency 12/29/2013  . Nausea alone 04/24/2013  . Frequency of urination 04/14/2013  . Fever, unspecified 04/14/2013  . Abdominal pain, unspecified site 04/14/2013  . Hepatic steatosis 12/28/2012  . Peripheral neuropathy 11/25/2012  . Melanoma of skin, site unspecified 11/25/2012  . Hyperlipidemia   . Depression   . Hypertension   . GERD (gastroesophageal reflux disease) 04/03/2012  . Tobacco abuse 03/14/2012  . Diabetes mellitus, type 2   . Arteriosclerotic cardiovascular disease (ASCVD) 01/06/2011    Medications Prior to Visit Current Outpatient Prescriptions on File Prior to Visit  Medication Sig Dispense Refill  . albuterol (PROVENTIL HFA;VENTOLIN HFA) 108 (90 BASE) MCG/ACT inhaler Inhale 2 puffs into the lungs every 6 (six) hours as needed for wheezing.  1 Inhaler  1  . aspirin EC 81 MG tablet Take 81 mg by mouth daily.      [name] atorvastatin (LIPITOR) 80 MG tablet Take 1 tablet (80 mg total) by mouth daily.  90 tablet  1  . gabapentin (NEURONTIN) 400 MG capsule Take 1 capsule (400 mg total) by mouth 3 (three) times daily.  90 capsule  1  . Glucose Blood (BLOOD GLUCOSE TEST STRIPS) STRP Use to check BG up to twice a day.  Dx:  250.02 (uncontrolled type 2 DM with need for insulin use)  100 each  2  .  Insulin Glargine (LANTUS) 100 UNIT/ML Solostar Pen Inject 45 Units into the skin at bedtime. Inject up to 40 units once daily  15 mL  2  . Linagliptin-Metformin HCl (JENTADUETO) 2.02-999 MG TABS Take 1 tablet by mouth 2 (two) times daily with a meal.  60 tablet  2  . mometasone-formoterol (DULERA) 100-5 MCG/ACT AERO Inhale 2 puffs into the lungs 2 (two) times daily.  1 Inhaler  0  . nitroGLYCERIN (NITROSTAT) 0.4 MG SL tablet Place 1 tablet (0.4 mg total) under the tongue every 5 (five) minutes as needed. For chest pain.  25 tablet  3  . omeprazole (PRILOSEC) 40 MG capsule Take 1 capsule (40 mg total) by mouth 2 (two) times daily.  180 capsule  1  . ONE TOUCH LANCETS MISC Test blood sugar bid. DX 250.02  200 each  3  . sertraline (ZOLOFT) 100 MG tablet Take 100 mg by mouth daily.      . tamsulosin (FLOMAX) 0.4 MG CAPS capsule Take 1 capsule (0.4 mg total) by mouth daily.  90 capsule  1  . traZODone (DESYREL) 150 MG tablet Take 150 mg by mouth at bedtime.      . Blood Glucose Monitoring Suppl (ONE TOUCH BASIC SYSTEM) W/DEVICE KIT Use to check blood sugar bid. DX 250.02  1 each  0  . [DISCONTINUED] metoprolol tartrate (LOPRESSOR) 25 MG tablet Take 25  mg by mouth 2 (two) times daily.         No current facility-administered medications on file prior to visit.    Current Medications (verified) Current Outpatient Prescriptions  Medication Sig Dispense Refill  . albuterol (PROVENTIL HFA;VENTOLIN HFA) 108 (90 BASE) MCG/ACT inhaler Inhale 2 puffs into the lungs every 6 (six) hours as needed for wheezing.  1 Inhaler  1  . aspirin EC 81 MG tablet Take 81 mg by mouth daily.      [name] atorvastatin (LIPITOR) 80 MG tablet Take 1 tablet (80 mg total) by mouth daily.  90 tablet  1  . gabapentin (NEURONTIN) 400 MG capsule Take 1 capsule (400 mg total) by mouth 3 (three) times daily.  90 capsule  1  . Glucose Blood (BLOOD GLUCOSE TEST STRIPS) STRP Use to check BG up to twice a day.  Dx:  250.02 (uncontrolled type 2  DM with need for insulin use)  100 each  2  . Insulin Glargine (LANTUS) 100 UNIT/ML Solostar Pen Inject 45 Units into the skin at bedtime. Inject up to 40 units once daily  15 mL  2  . Linagliptin-Metformin HCl (JENTADUETO) 2.02-999 MG TABS Take 1 tablet by mouth 2 (two) times daily with a meal.  60 tablet  2  . mometasone-formoterol (DULERA) 100-5 MCG/ACT AERO Inhale 2 puffs into the lungs 2 (two) times daily.  1 Inhaler  0  . nitroGLYCERIN (NITROSTAT) 0.4 MG SL tablet Place 1 tablet (0.4 mg total) under the tongue every 5 (five) minutes as needed. For chest pain.  25 tablet  3  . omeprazole (PRILOSEC) 40 MG capsule Take 1 capsule (40 mg total) by mouth 2 (two) times daily.  180 capsule  1  . ONE TOUCH LANCETS MISC Test blood sugar bid. DX 250.02  200 each  3  . sertraline (ZOLOFT) 100 MG tablet Take 100 mg by mouth daily.      . tamsulosin (FLOMAX) 0.4 MG CAPS capsule Take 1 capsule (0.4 mg total) by mouth daily.  90 capsule  1  . traZODone (DESYREL) 150 MG tablet Take 150 mg by mouth at bedtime.      . Blood Glucose Monitoring Suppl (ONE TOUCH BASIC SYSTEM) W/DEVICE KIT Use to check blood sugar bid. DX 250.02  1 each  0  . [DISCONTINUED] metoprolol tartrate (LOPRESSOR) 25 MG tablet Take 25 mg by mouth 2 (two) times daily.         No current facility-administered medications for this visit.     Allergies (verified) Review of patient's allergies indicates no known allergies.   PAST HISTORY  Family History Family History  Problem Relation Age of Onset  . Lung cancer    . Heart disease    . Other      not real familiar with family history  . Colon cancer Neg Hx   . Colon polyps Neg Hx   . Stroke Mother   . Alcohol abuse Father     Social History History  Substance Use Topics  . Smoking status: Current Every Day Smoker -- 0.50 packs/day for 30 years    Types: Cigarettes    Start date: 10/09/1981  . Smokeless tobacco: Never Used  . 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    Are there smokers in your home (other than you)?  No  Risk Factors Current exercise habits: Home  exercise routine includes walking 1 hrs per day.  Dietary issues discussed: limiting high CHO foods  Cardiac risk factors: advanced age (older than 15 for men, 55 for women), diabetes mellitus, dyslipidemia, family history of premature cardiovascular disease, male gender and smoking/ tobacco exposure.  Depression Screen (Note: if answer to either of the following is "Yes", a more complete depression screening is indicated)   Q1: Over the past two weeks, have you felt down, depressed or hopeless? Yes  Q2: Over the past two weeks, have you felt little interest or pleasure in doing things? Yes  Have you lost interest or pleasure in daily life? Yes  Do you often feel hopeless? Yes  Do you cry easily over simple problems? No  Activities of Daily Living In your present state of health, do you have any difficulty performing the following activities?:  Driving? No Managing money?  No Feeding yourself? No Getting from bed to chair? No Climbing a flight of stairs? No Preparing food and eating?: No Bathing or showering? No Getting dressed: No Getting to the toilet? No Using the toilet:No Moving around from place to place: No In the past year have you fallen or had a near fall?:Yes   Are you sexually active?  Yes  Do you have more than one partner?  No  Hearing Difficulties: No Do you often ask people to speak up or repeat themselves? No Do you experience ringing or noises in your ears? No Do you have difficulty understanding soft or whispered voices? No   Do you feel that you have a problem with memory? No  Do you often misplace items? No  Do you feel safe at home?  No  Cognitive Testing  Alert? Yes  Normal Appearance?Yes  Oriented to person? Yes  Place? Yes   Time? Yes  Recall of three objects?   Yes  Can perform simple calculations? Yes  Displays appropriate judgment?Yes  Can read the correct time from a watch face?Yes   Advanced Directives have been discussed with the patient? Yes   List the Names of Other Physician/Practitioners you currently use: 1.  Dr Hoyle Barr - Psychiatry 2.   Dr. Bronson Ing - Cardiology 3.  Dr Gala Romney - GI 4.  Dr Genia Del - Opthamology   Indicate any recent Medical Services you may have received from other than [facility] providers in the past year (date may be approximate).  Immunization History  Administered Date(s) Administered  . Influenza Split 07/05/2012  . Influenza,inj,Quad PF,36+ Mos 09/24/2013  . Pneumococcal Polysaccharide-23 07/05/2012    Screening Tests Health Maintenance  Topic Date Due  . Foot Exam  06/11/1975  . Ophthalmology Exam  06/11/1975  . Urine Microalbumin  06/11/1975  . Influenza Vaccine  05/09/2014  . Hemoglobin A1c  09/29/2014  . Tetanus/tdap  04/04/2017  . Pneumococcal Polysaccharide Vaccine (##2) 07/05/2017    All answers were reviewed with the patient and necessary referrals were made:  Cherre Robins, [facility]   05/13/2014   History reviewed: allergies, current medications, past family history, past medical history, past social history, past surgical history and problem list  Objective:    Blood pressure 124/78, pulse 82, height 6' 1.5" (1.867 m), weight 199 lb (90.266 kg). Body mass index is 25.9 kg/(m^2).  PCO A1c = over 14% today  Assessment:     Initial Medicare Wellness Visit Type 2 Diabetes - uncontrolled     Plan:     During the course of the visit the patient was educated and counseled about  appropriate screening and preventive services including:    Pneumococcal vaccine   Influenza vaccine  Hepatitis B vaccine  Td vaccine  Screening electrocardiogram  Prostate cancer screening  Colorectal cancer screening  Dialated eye exam needed - patient encouraged to make appt  Nutrition counseling -  reviewed CHO serving sizes and recommendations  Smoking cessation counseling - patient is currently trying to decrease smoking.  Discussed health and financial benefits of smoking cessation. Plan to decrease to 1/4 ppd if able.  RTC in 3 weeks to follow up  Advanced directives: has NO advanced directive - not interested in additional information  Increase Lantus to 55 units daily  Add Novolog prior to each meal   Check blood glucose prior to each meal.  Inject Novolog  per siding scale below   70 or below = too low (eat something to increase blood  glucose / sugar)  71 to 150 = no insulin  151 to 200 = 2 units  201 to 250 = 3 units  251 to 300 = 5 units  301 or higher = 7 units   Call office for insulin adjustment if you have 2 or more  blood glucose / sugar reading over 250 or less than 70  with in a week. Cherre Robins [phone]     Patient Instructions (the written plan) was given to the patient.  Medicare Attestation I have personally reviewed: The patient's medical and social history Their use of alcohol, tobacco or illicit drugs Their current medications and supplements The patient's functional ability including ADLs,fall risks, home safety risks, cognitive, and hearing and visual impairment Diet and physical activities Evidence for depression or mood disorders  The patient's weight, height, BMI, and HR  BP have been recorded in the chart.  I have made referrals, counseling, and provided education to the patient based on review of the above and I have provided the patient with a written personalized care plan for preventive services.     Cherre Robins, [facility]   05/13/2014

## 2014-05-15 ENCOUNTER — Other Ambulatory Visit: Payer: Self-pay | Admitting: Pharmacist

## 2014-05-15 MED ORDER — INSULIN GLARGINE 100 UNIT/ML SOLOSTAR PEN: 55.0000 [IU] | PEN_INJECTOR | Freq: Every day | SUBCUTANEOUS | Status: DC

## 2014-06-03 ENCOUNTER — Encounter: Payer: Self-pay | Admitting: Pharmacist

## 2014-06-03 ENCOUNTER — Ambulatory Visit (INDEPENDENT_AMBULATORY_CARE_PROVIDER_SITE_OTHER): Payer: Medicare Other | Admitting: Pharmacist

## 2014-06-03 VITALS — BP 130/70 | HR 77 | Ht 73.5 in | Wt 207.0 lb

## 2014-06-03 DIAGNOSIS — E1142 Type 2 diabetes mellitus with diabetic polyneuropathy: Secondary | ICD-10-CM

## 2014-06-03 DIAGNOSIS — E1140 Type 2 diabetes mellitus with diabetic neuropathy, unspecified: Secondary | ICD-10-CM

## 2014-06-03 DIAGNOSIS — Z720 Tobacco use: Secondary | ICD-10-CM

## 2014-06-03 DIAGNOSIS — F17200 Nicotine dependence, unspecified, uncomplicated: Secondary | ICD-10-CM

## 2014-06-03 DIAGNOSIS — E785 Hyperlipidemia, unspecified: Secondary | ICD-10-CM

## 2014-06-03 DIAGNOSIS — E1149 Type 2 diabetes mellitus with other diabetic neurological complication: Secondary | ICD-10-CM

## 2014-06-03 NOTE — Patient Instructions (Signed)
Increase Lantus to 62 units once a day    Continue to use Novolog Insulin (orange box) per sliding scale below  Check blood glucose prior to each meal. Inject Novolog per siding scale below   70 or below = too low (eat something to increase blood glucose / sugar)  71 to 150 = no insulin  151 to 200 = 2 units  201 to 250 = 3 units  251 to 300 = 5 units  301 or higher = 7 units   Call office for insulin adjustment if you have 2 or more blood glucose / sugar reading over 250 or less than 70 with in a week. Cherre Robins [phone]     Diabetes and Standards of Medical Care  Diabetes is complicated. You may find that your diabetes team includes a dietitian, nurse, diabetes educator, eye doctor, and more. To help everyone know what is going on and to help you get the care you deserve, the following schedule of care was developed to help keep you on track. Below are the tests, exams, vaccines, medicines, education, and plans you will need.  Blood Glucose Goals Prior to meals = 80 - 130 Within 2 hours of the start of a meal = less than 180  HbA1c test (goal is less than 7.0% - your last value was over 14%) This test shows how well you have controlled your glucose over the past 2 3 months. It is used to see if your diabetes management plan needs to be adjusted.   It is performed at least 2 times a year if you are meeting treatment goals.  It is performed 4 times a year if therapy has changed or if you are not meeting treatment goals.   Blood pressure test  This test is performed at every routine medical visit. The goal is less than 140/90 mmHg for most people, but 130/80 mmHg in some cases. Ask your health care provider about your goal. Dental exam  Follow up with the dentist regularly. Eye exam  If you are diagnosed with type 1 diabetes as a child, get an exam upon reaching the age of 16 years or older and have had diabetes for 3 5 years. Yearly eye exams are recommended after that  initial eye exam.  If you are diagnosed with type 1 diabetes as an adult, get an exam within 5 years of diagnosis and then yearly.  If you are diagnosed with type 2 diabetes, get an exam as soon as possible after the diagnosis and then yearly. Foot care exam  Visual foot exams are performed at every routine medical visit. The exams check for cuts, injuries, or other problems with the feet.  A comprehensive foot exam should be done yearly. This includes visual inspection as well as assessing foot pulses and testing for loss of sensation.  Check your feet nightly for cuts, injuries, or other problems with your feet. Tell your health care provider if anything is not healing. Kidney function test (urine microalbumin)  This test is performed once a year.  Type 1 diabetes: The first test is performed 5 years after diagnosis.  Type 2 diabetes: The first test is performed at the time of diagnosis.  A serum creatinine and estimated glomerular filtration rate (eGFR) test is done once a year to assess the level of chronic kidney disease (CKD), if present. Lipid profile (cholesterol, HDL, LDL, triglycerides)  Performed every 5 years for most people.  The goal for LDL is less than  100 mg/dL. If you are at high risk, the goal is less than 70 mg/dL.  The goal for HDL is 40 mg/dL 50 mg/dL for men and 50 mg/dL 60 mg/dL for women. An HDL cholesterol of 60 mg/dL or higher gives some protection against heart disease.  The goal for triglycerides is less than 150 mg/dL. Influenza vaccine, pneumococcal vaccine, and hepatitis B vaccine  The influenza vaccine is recommended yearly.  The pneumococcal vaccine is generally given once in a lifetime. However, there are some instances when another vaccination is recommended. Check with your health care provider.  The hepatitis B vaccine is also recommended for adults with diabetes. Diabetes self-management education  Education is recommended at diagnosis and  ongoing as needed. Treatment plan  Your treatment plan is reviewed at every medical visit. Document Released: 07/23/2009 Document Revised: 05/28/2013 Document Reviewed: 02/25/2013 [facility] Patient Information 2014 [place].

## 2014-06-03 NOTE — Progress Notes (Signed)
Diabetes Follow-Up Visit Chief Complaint:   Chief Complaint  Patient presents with  . Diabetes     Filed Vitals:   06/03/14 1528  BP: 130/70  Pulse: 77   Filed Weights   06/03/14 1528  Weight: 207 lb (93.895 kg)    HPI:  Saw patient about 1 month ago and started Novolog sliding scale until we can get Lantus dose to target.    He reports that BG has improved usually in 180's to 190's as opposed to 200's  Current Diabetes Medications:  Lantus 55 units daily, Jentudueto 2.5/1000mg  1 tablet bid and Novolog per sliding scale  70 or below = too low (eat something to increase blood glucose / sugar)  71 to 150 = no insulin  151 to 200 = 2 units  201 to 250 = 3 units  251 to 300 = 5 units  301 or higher = 7 units  Call office for insulin adjustment if you have 2 or more blood glucose / sugar reading over 250 or less than 70 with in a week. Marlena Barbato [phone]    Home BG Monitoring:  Checking 1-2 times a day. Lowest HBG reading - 126 Highest HBG reading - 197  Low fat/carbohydrate diet?  Yes Nicotine Abuse?  Yes - still smoking about 1/2 ppd - he is trying to decrease more but has been unsuccessful.  Declines pharmacotherapy Medication Compliance?  Yes Exercise?  Yes - walking daily  Exam Edema:  negative  Polyuria:  negative  Polydipsia:  negative Polyphagia:  negative  BMI:  Body mass index is 26.94 kg/(m^2).   Weight changes:  increasing General Appearance:  alert, oriented, no acute distress and well nourished Mood/Affect:  normal   Lab Results  Component Value Date   HGBA1C >14.0% 05/13/2014    No results found for this basenameDerl Barrow    Lab Results  Component Value Date   CHOL 149 05/07/2013   HDL 43 12/10/2013   LDLCALC 77 12/10/2013   TRIG 207* 12/10/2013   CHOLHDL 3.7 12/10/2013      Assessment: 1.  Diabetes.  Uncontrolled - not at goals  2.  Blood Pressure.  At goal 3.  Lipids.  Elevated Tg likely due to uncontrolled DM and diet 4. Smoking  cessation / Tobacco abuse  Recommendations: 1.  Medication recommendations at this time are as follows:    Increase lantus to 62 units daily   Continue Novolog Insulin (orange box)   Check blood glucose prior to each meal. Inject Novolog per siding scale below   70 or below = too low (eat something to increase blood glucose / sugar)   71 to 150 = no insulin   151 to 200 = 2 units   201 to 250 = 3 units   251 to 300 = 5 units   301 or higher = 7 units   Call office for insulin adjustment if you have 2 or more blood glucose / sugar reading over 250 or less than 70 with in a week. Juniel Groene [phone]   2.  Reviewed HBG goals:  Fasting 80-130 and 1-2 hour post prandial <180.  Patient is instructed to check BG 2 times per day.    3.  BP goal < 140/85. 4.  LDL goal of < 100, HDL > 40 and TG < 150 5.  Physical Activity recommendations:  Continue walking daily   6.  Discussed smoking cessation again - reminded about [place] Quit Line -  information provided 7.  Return to clinic in 1 month to see PCP, November to see clinical pharmacist  Time spent counseling patient:  30 minutes  Referring provider:  Roosvelt Harps, PharmD, CPP, CDE

## 2014-06-10 ENCOUNTER — Telehealth: Payer: Self-pay | Admitting: Pharmacist

## 2014-06-12 NOTE — Telephone Encounter (Signed)
Tell her to go to clerk of court and explain her medical problems because notes from medical just Annoy the court and they are going to do what they want

## 2014-06-14 ENCOUNTER — Emergency Department (HOSPITAL_COMMUNITY)
Admission: EM | Admit: 2014-06-14 | Discharge: 2014-06-14 | Disposition: A | Discharge status: Home / Self Care | Payer: Medicare Other | Attending: Emergency Medicine | Admitting: Emergency Medicine

## 2014-06-14 ENCOUNTER — Emergency Department (HOSPITAL_COMMUNITY): Payer: Medicare Other

## 2014-06-14 ENCOUNTER — Encounter (HOSPITAL_COMMUNITY): Payer: Self-pay | Admitting: Emergency Medicine

## 2014-06-14 DIAGNOSIS — Z8659 Personal history of other mental and behavioral disorders: Secondary | ICD-10-CM | POA: Diagnosis not present

## 2014-06-14 DIAGNOSIS — J4489 Other specified chronic obstructive pulmonary disease: Secondary | ICD-10-CM | POA: Insufficient documentation

## 2014-06-14 DIAGNOSIS — Z9861 Coronary angioplasty status: Secondary | ICD-10-CM | POA: Diagnosis not present

## 2014-06-14 DIAGNOSIS — Z79899 Other long term (current) drug therapy: Secondary | ICD-10-CM | POA: Insufficient documentation

## 2014-06-14 DIAGNOSIS — Z7982 Long term (current) use of aspirin: Secondary | ICD-10-CM | POA: Insufficient documentation

## 2014-06-14 DIAGNOSIS — E669 Obesity, unspecified: Secondary | ICD-10-CM | POA: Diagnosis not present

## 2014-06-14 DIAGNOSIS — Z872 Personal history of diseases of the skin and subcutaneous tissue: Secondary | ICD-10-CM | POA: Insufficient documentation

## 2014-06-14 DIAGNOSIS — I2510 Atherosclerotic heart disease of native coronary artery without angina pectoris: Secondary | ICD-10-CM | POA: Diagnosis not present

## 2014-06-14 DIAGNOSIS — I951 Orthostatic hypotension: Secondary | ICD-10-CM | POA: Diagnosis not present

## 2014-06-14 DIAGNOSIS — I10 Essential (primary) hypertension: Secondary | ICD-10-CM | POA: Diagnosis not present

## 2014-06-14 DIAGNOSIS — R42 Dizziness and giddiness: Secondary | ICD-10-CM | POA: Diagnosis present

## 2014-06-14 DIAGNOSIS — Z794 Long term (current) use of insulin: Secondary | ICD-10-CM | POA: Diagnosis not present

## 2014-06-14 DIAGNOSIS — Z9889 Other specified postprocedural states: Secondary | ICD-10-CM | POA: Diagnosis not present

## 2014-06-14 DIAGNOSIS — F17200 Nicotine dependence, unspecified, uncomplicated: Secondary | ICD-10-CM | POA: Diagnosis not present

## 2014-06-14 DIAGNOSIS — I252 Old myocardial infarction: Secondary | ICD-10-CM | POA: Insufficient documentation

## 2014-06-14 DIAGNOSIS — J449 Chronic obstructive pulmonary disease, unspecified: Secondary | ICD-10-CM | POA: Diagnosis not present

## 2014-06-14 LAB — CBC WITH DIFFERENTIAL/PLATELET
BASOS PCT: 0 % (ref 0–1)
Basophils Absolute: 0 10*3/uL (ref 0.0–0.1)
EOS PCT: 1 % (ref 0–5)
Eosinophils Absolute: 0.1 10*3/uL (ref 0.0–0.7)
HCT: 42.2 % (ref 39.0–52.0)
Hemoglobin: 15.2 g/dL (ref 13.0–17.0)
Lymphocytes Relative: 24 % (ref 12–46)
Lymphs Abs: 2.4 10*3/uL (ref 0.7–4.0)
MCH: 32.8 pg (ref 26.0–34.0)
MCHC: 36 g/dL (ref 30.0–36.0)
MCV: 90.9 fL (ref 78.0–100.0)
MONOS PCT: 10 % (ref 3–12)
Monocytes Absolute: 1 10*3/uL (ref 0.1–1.0)
NEUTROS PCT: 65 % (ref 43–77)
Neutro Abs: 6.6 10*3/uL (ref 1.7–7.7)
Platelets: 281 10*3/uL (ref 150–400)
RBC: 4.64 MIL/uL (ref 4.22–5.81)
RDW: 12.8 % (ref 11.5–15.5)
WBC: 10 10*3/uL (ref 4.0–10.5)

## 2014-06-14 LAB — CBG MONITORING, ED: Glucose-Capillary: 138 mg/dL — ABNORMAL HIGH (ref 70–99)

## 2014-06-14 LAB — I-STAT TROPONIN, ED
Troponin i, poc: 0 ng/mL (ref 0.00–0.08)
Troponin i, poc: 0 ng/mL (ref 0.00–0.08)

## 2014-06-14 LAB — BASIC METABOLIC PANEL
Anion gap: 14 (ref 5–15)
BUN: 12 mg/dL (ref 6–23)
CALCIUM: 8.7 mg/dL (ref 8.4–10.5)
CO2: 24 mEq/L (ref 19–32)
CREATININE: 0.82 mg/dL (ref 0.50–1.35)
Chloride: 104 mEq/L (ref 96–112)
GFR calc Af Amer: >90 mL/min (ref >90)
Glucose, Bld: 135 mg/dL — ABNORMAL HIGH (ref 70–99)
POTASSIUM: 3.6 meq/L — AB (ref 3.7–5.3)
Sodium: 142 mEq/L (ref 137–147)

## 2014-06-14 MED ORDER — SODIUM CHLORIDE 0.9 % IV BOLUS (SEPSIS)
1000.0000 mL | Freq: Once | INTRAVENOUS | Status: AC
  Administered 2014-06-14: 1000 mL via INTRAVENOUS

## 2014-06-14 NOTE — ED Notes (Signed)
Received pt from home with c/o when pt got up from couch he felt dizziness and weakness to B/l legs causing pt to fall. CBG for EMS 71. EMS encouraged pt to eat, pt did not feel any better. Initial HR for EMS 120's. Pt given 500 ML NS bolus by EMS and 324 MG of Asa due to pt EKG showing LBBB and pt cannot recall history of same. Pt reports symptoms felt today are similar to symptoms when he had his heart attack.

## 2014-06-14 NOTE — Discharge Instructions (Signed)
Dizziness °Dizziness is a common problem. It is a feeling of unsteadiness or light-headedness. You may feel like you are about to faint. Dizziness can lead to injury if you stumble or fall. A person of any age group can suffer from dizziness, but dizziness is more common in older adults. °CAUSES  °Dizziness can be caused by many different things, including: °· Middle ear problems. °· Standing for too long. °· Infections. °· An allergic reaction. °· Aging. °· An emotional response to something, such as the sight of blood. °· Side effects of medicines. °· Tiredness. °· Problems with circulation or blood pressure. °· Excessive use of alcohol or medicines, or illegal drug use. °· Breathing too fast (hyperventilation). °· An irregular heart rhythm (arrhythmia). °· A low red blood cell count (anemia). °· Pregnancy. °· Vomiting, diarrhea, fever, or other illnesses that cause body fluid loss (dehydration). °· Diseases or conditions such as Parkinson's disease, high blood pressure (hypertension), diabetes, and thyroid problems. °· Exposure to extreme heat. °DIAGNOSIS  °Your health care provider will ask about your symptoms, perform a physical exam, and perform an electrocardiogram (ECG) to record the electrical activity of your heart. Your health care provider may also perform other heart or blood tests to determine the cause of your dizziness. These may include: °· Transthoracic echocardiogram (TTE). During echocardiography, sound waves are used to evaluate how blood flows through your heart. °· Transesophageal echocardiogram (TEE). °· Cardiac monitoring. This allows your health care provider to monitor your heart rate and rhythm in real time. °· Holter monitor. This is a portable device that records your heartbeat and can help diagnose heart arrhythmias. It allows your health care provider to track your heart activity for several days if needed. °· Stress tests by exercise or by giving medicine that makes the heart beat  faster. °TREATMENT  °Treatment of dizziness depends on the cause of your symptoms and can vary greatly. °HOME CARE INSTRUCTIONS  °· Drink enough fluids to keep your urine clear or pale yellow. This is especially important in very hot weather. In older adults, it is also important in cold weather. °· Take your medicine exactly as directed if your dizziness is caused by medicines. When taking blood pressure medicines, it is especially important to get up slowly. °¨ Rise slowly from chairs and steady yourself until you feel okay. °¨ In the morning, first sit up on the side of the bed. When you feel okay, stand slowly while holding onto something until you know your balance is fine. °· Move your legs often if you need to stand in one place for a long time. Tighten and relax your muscles in your legs while standing. °· Have someone stay with you for 1-2 days if dizziness continues to be a problem. Do this until you feel you are well enough to stay alone. Have the person call your health care provider if he or she notices changes in you that are concerning. °· Do not drive or use heavy machinery if you feel dizzy. °· Do not drink alcohol. °SEEK IMMEDIATE MEDICAL CARE IF:  °· Your dizziness or light-headedness gets worse. °· You feel nauseous or vomit. °· You have problems talking, walking, or using your arms, hands, or legs. °· You feel weak. °· You are not thinking clearly or you have trouble forming sentences. It may take a friend or family member to notice this. °· You have chest pain, abdominal pain, shortness of breath, or sweating. °· Your vision changes. °· You notice   any bleeding. °· You have side effects from medicine that seems to be getting worse rather than better. °MAKE SURE YOU:  °· Understand these instructions. °· Will watch your condition. °· Will get help right away if you are not doing well or get worse. °Document Released: 03/21/2001 Document Revised: 09/30/2013 Document Reviewed: 04/14/2011 °ExitCare®  Patient Information ©2015 [employer]. This information is not intended to replace advice given to you by your health care provider. Make sure you discuss any questions you have with your health care provider. °Orthostatic Hypotension °Orthostatic hypotension is a sudden drop in blood pressure. It happens when you quickly stand up from a seated or lying position. You may feel dizzy or light-headed. This can last for just a few seconds or for up to a few minutes. It is usually not a serious problem. However, if this happens frequently or gets worse, it can be a sign of something more serious. °CAUSES  °Different things can cause orthostatic hypotension, including:  °· Loss of body fluids (dehydration). °· Medicines that lower blood pressure. °· Sudden changes in posture, such as standing up quickly after you have been sitting or lying down. °· Taking too much of your medicine. °SIGNS AND SYMPTOMS  °· Light-headedness or dizziness.   °· Fainting or near-fainting.   °· A fast heart rate.   °· Weakness.   °· Feeling tired (fatigue).   °DIAGNOSIS  °Your health care provider may do several things to help diagnose your condition and identify the cause. These may include:  °· Taking a medical history and doing a physical exam. °· Checking your blood pressure. Your health care provider will check your blood pressure when you are: °¨ Lying down. °¨ Sitting. °¨ Standing. °· Using tilt table testing. In this test, you lie down on a table that moves from a lying position to a standing position. You will be strapped onto the table. This test monitors your blood pressure and heart rate when you are in different positions. °TREATMENT  °Treatment will vary depending on the cause. Possible treatments include:  °· Changing the dosage of your medicines.  °· Wearing compression stockings on your lower legs. °· Standing up slowly after sitting or lying down. °· Eating more salt. °· Eating frequent, small meals. °· In some cases,  getting IV fluids. °· Taking medicine to enhance fluid retention. °HOME CARE INSTRUCTIONS °· Only take over-the-counter or prescription medicines as directed by your health care provider. °¨ Follow your health care provider's instructions for changing the dosage of your current medicines. °¨ Do not stop or adjust your medicine on your own. °· Stand up slowly after sitting or lying down. This allows your body to adjust to the different position. °· Wear compression stockings as directed. °· Eat extra salt as directed. °¨ Do not add extra salt to your diet unless directed to by your health care provider. °· Eat frequent, small meals. °· Avoid standing suddenly after eating. °· Avoid hot showers or excessive heat as directed by your health care provider. °· Keep all follow-up appointments. °SEEK MEDICAL CARE IF: °· You continue to feel dizzy or light-headed after standing. °· You feel groggy or confused. °· You feel cold, clammy, or sick to your stomach (nauseous). °· You have blurred vision. °· You feel short of breath. °SEEK IMMEDIATE MEDICAL CARE IF:  °· You faint after standing. °· You have chest pain.  °· You have difficulty breathing.   °· You lose feeling or movement in your arms or legs.   °· You have slurred   speech or difficulty talking, or you are unable to talk.   °MAKE SURE YOU:  °· Understand these instructions. °· Will watch your condition. °· Will get help right away if you are not doing well or get worse. °Document Released: 09/15/2002 Document Revised: 09/30/2013 Document Reviewed: 07/18/2013 °ExitCare® Patient Information ©2015 [employer]. This information is not intended to replace advice given to you by your health care provider. Make sure you discuss any questions you have with your health care provider. ° °

## 2014-06-14 NOTE — ED Provider Notes (Signed)
TIME SEEN: 6:45 PM  CHIEF COMPLAINT: Near syncopal event  HPI: Patient is a 48 y.o. M with history of hypertension, diabetes, hyperlipidemia, coronary artery disease with normal ejection fraction, COPD presents emergency department with a near syncopal event. He states that he was sitting on his couch watching TV when he stood up and felt very lightheaded and fell to the ground. He denies that he passed out or hit his head. He denies any chest pain or discomfort, increase in his chronic shortness of breath. No recent nausea, vomiting, diarrhea, bloody stool or melena. No headache, numbness, tingling or focal weakness. No fevers. He does have a chronic cough. No history of PE or DVT, recent prolonged immobilization, fracture, surgery, trauma, lower extremity swelling or pain.  ROS: See HPI Constitutional: no fever  Eyes: no drainage  ENT: no runny nose   Cardiovascular:  no chest pain  Resp: no SOB  GI: no vomiting GU: no dysuria Integumentary: no rash  Allergy: no hives  Musculoskeletal: no leg swelling  Neurological: no slurred speech ROS otherwise negative  PAST MEDICAL HISTORY/PAST SURGICAL HISTORY:  Past Medical History  Diagnosis Date  . Diabetes mellitus     Type 2  . Hyperlipidemia   . Depression   . Coronary artery disease     s/p BMS to Ramus 10/2010;  Cath 01/22/12 prox 30-40% LAD, LCx ramus w/ hazy 70-80% in-stent restenosis, EF 60% treated medically  . HTN (hypertension)   . Tobacco abuse   . Obesity   . Anxiety   . Myocardial infarction 2012  . GERD (gastroesophageal reflux disease)   . Hyperlipidemia   . Asthma   . Coronary artery disease     s/p BMS to Ramus 10/2010;  Cath 01/22/12 prox 30-40% LAD, LCx ramus w/ hazy 70-80% in-stent restenosis, EF 60% treated medically   . Major depression, chronic 08/13/2012  . Gastroparesis 05/2013  . Tubular adenoma   . COPD (chronic obstructive pulmonary disease)   . Melanoma 2007    surgery at [facility], Followed by [facility]     MEDICATIONS:  Prior to Admission medications   Medication Sig Start Date End Date Taking? Authorizing Provider  albuterol (PROVENTIL HFA;VENTOLIN HFA) 108 (90 BASE) MCG/ACT inhaler Inhale 2 puffs into the lungs every 6 (six) hours as needed for wheezing. 03/30/14   Lysbeth Penner, FNP  aspirin EC 81 MG tablet Take 81 mg by mouth daily.    Historical Provider, MD  atorvastatin (LIPITOR) 80 MG tablet Take 1 tablet (80 mg total) by mouth daily. 03/30/14   Lysbeth Penner, FNP  Blood Glucose Monitoring Suppl (ONE TOUCH BASIC SYSTEM) W/DEVICE KIT Use to check blood sugar bid. DX 250.02 03/18/14   Lysbeth Penner, FNP  gabapentin (NEURONTIN) 400 MG capsule Take 1 capsule (400 mg total) by mouth 3 (three) times daily. 02/19/14   Lysbeth Penner, FNP  Glucose Blood (BLOOD GLUCOSE TEST STRIPS) STRP Use to check BG up to twice a day.  Dx:  250.02 (uncontrolled type 2 DM with need for insulin use) 02/19/14   Tammy Eckard, PHARMD  insulin aspart (NOVOLOG FLEXPEN) 100 UNIT/ML FlexPen Inject up to 10 units prior to meal per sliding scale. 05/13/14   Tammy Eckard, PHARMD  Insulin Glargine (LANTUS) 100 UNIT/ML Solostar Pen Inject 62 Units into the skin at bedtime. 06/03/14   Tammy Eckard, PHARMD  Linagliptin-Metformin HCl (JENTADUETO) 2.02-999 MG TABS Take 1 tablet by mouth 2 (two) times daily with a meal. 02/19/14   Tammy  Eckard, PHARMD  mometasone-formoterol (DULERA) 100-5 MCG/ACT AERO Inhale 2 puffs into the lungs 2 (two) times daily. 03/26/14   Lysbeth Penner, FNP  nitroGLYCERIN (NITROSTAT) 0.4 MG SL tablet Place 1 tablet (0.4 mg total) under the tongue every 5 (five) minutes as needed. For chest pain. 08/13/12   Reece Packer, NP  omeprazole (PRILOSEC) 40 MG capsule Take 1 capsule (40 mg total) by mouth 2 (two) times daily. 02/19/14   Lysbeth Penner, FNP  ONE TOUCH LANCETS MISC Test blood sugar bid. DX 250.02 03/18/14   Lysbeth Penner, FNP  sertraline (ZOLOFT) 100 MG tablet Take 100 mg by mouth daily.     Historical Provider, MD  tamsulosin (FLOMAX) 0.4 MG CAPS capsule Take 1 capsule (0.4 mg total) by mouth daily. 02/19/14   Lysbeth Penner, FNP  traZODone (DESYREL) 150 MG tablet Take 150 mg by mouth at bedtime.    Historical Provider, MD    ALLERGIES:  No Known Allergies  SOCIAL HISTORY:  History  Substance Use Topics  . Smoking status: Current Every Day Smoker -- 0.50 packs/day for 30 years    Types: Cigarettes    Start date: 10/09/1981  . Smokeless tobacco: Never Used  . 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    FAMILY HISTORY: Family History  Problem Relation Age of Onset  . Lung cancer    . Heart disease    . Other      not real familiar with family history  . Colon cancer Neg Hx   . Colon polyps Neg Hx   . Stroke Mother   . Alcohol abuse Father     EXAM: BP 124/64  Pulse 96  Temp(Src) 97.7 F (36.5 C) (Oral)  Resp 13  Ht '6\' 2"'  (1.88 m)  Wt 199 lb (90.266 kg)  BMI 25.54 kg/m2  SpO2 98% CONSTITUTIONAL: Alert and oriented and responds appropriately to questions. Well-appearing; well-nourished HEAD: Normocephalic EYES: Conjunctivae clear, PERRL ENT: normal nose; no rhinorrhea; moist mucous membranes; pharynx without lesions noted NECK: Supple, no meningismus, no LAD  CARD: RRR; S1 and S2 appreciated; no murmurs, no clicks, no rubs, no gallops RESP: Normal chest excursion without splinting or tachypnea; breath sounds clear and equal bilaterally; no wheezes, no rhonchi, no rales,  ABD/GI: Normal bowel sounds; non-distended; soft, non-tender, no rebound, no guarding BACK:  The back appears normal and is non-tender to palpation, there is no CVA tenderness EXT: Normal ROM in all joints; non-tender to palpation; no edema; normal capillary refill; no cyanosis    SKIN: Normal color for age and race; warm NEURO: Moves all extremities equally, sensation to light touch intact  diffusely, cranial nerves II through XII intact PSYCH: The patient's mood and manner are appropriate. Grooming and personal hygiene are appropriate.  MEDICAL DECISION MAKING: Patient here with what sounds like orthostatic hypotension. I have him sit up on exam he becomes lightheaded and his heart rate rises. We'll give IV fluids. Discussed with him that I do not think this is his anginal equivalent as when he had similar symptoms with his prior heart attack he did have chest pain. His EKG shows an old left bundle branch block which is unchanged. We'll obtain labs including troponin, chest x-ray, urine. Will continue to closely monitor but I anticipate that he will be able to be discharged home.  ED PROGRESS: Patient reports feeling much better after  IV fluids. Plan is to obtain a delta troponin but anticipate this will be negative and he can be discharged home. Patient comfortable with this plan.   Second troponin negative. Patient is still asymptomatic. We'll discharge home. Discussed strict return precautions and supportive care instructions. He verbalizes understanding and is comfortable with plan.     EKG Interpretation  Date/Time:  Sunday June 14 2014 18:25:50 EDT Ventricular Rate:  99 PR Interval:  202 QRS Duration: 146 QT Interval:  400 QTC Calculation: 513 R Axis:   63 Text Interpretation:  Sinus rhythm Nonspecific intraventricular conduction delay Probable anteroseptal infarct, unchanged Lateral leads are also involved No significant change since last tracing Nov 02 2013 Confirmed by [place],  DO, KRISTEN [phone]) on 06/14/2014 6:43:23 PM          [place], DO 06/14/14 [id number]

## 2014-06-14 NOTE — ED Notes (Signed)
Pt. returned from XR. 

## 2014-06-14 NOTE — ED Notes (Signed)
Patient transported to X-ray 

## 2014-06-16 ENCOUNTER — Telehealth: Payer: Self-pay | Admitting: Family Medicine

## 2014-06-16 NOTE — Telephone Encounter (Signed)
The paper he brought over was for him to fill out.  I believe Zigmund Daniel is taking care of this.  My last note was that he needs to inform the clerk of court of his medical problems and if they then provide him a written request from his provider then we have something to fill out.  He doesn't need a note from his provider.  The paper he brought was not for his provider to fill out.

## 2014-06-16 NOTE — Telephone Encounter (Signed)
Pt notified there is no medical reason to excuse from [place] understanding

## 2014-06-16 NOTE — Telephone Encounter (Signed)
Spoke with pt regarding note for jury duty [employer] understanding

## 2014-06-16 NOTE — Telephone Encounter (Signed)
Dustin Cruz has discussed with [facility] and Dustin Cruz  is going to look at his chart to see if we have sent a letter in the past

## 2014-06-30 ENCOUNTER — Ambulatory Visit: Payer: Medicare Other | Admitting: Family Medicine

## 2014-07-10 ENCOUNTER — Other Ambulatory Visit: Payer: Self-pay | Admitting: Family Medicine

## 2014-07-28 ENCOUNTER — Encounter: Payer: Self-pay | Admitting: Family Medicine

## 2014-07-28 ENCOUNTER — Ambulatory Visit (INDEPENDENT_AMBULATORY_CARE_PROVIDER_SITE_OTHER): Payer: Medicare Other | Admitting: Family Medicine

## 2014-07-28 VITALS — BP 138/82 | HR 84 | Temp 97.5°F | Ht 73.5 in | Wt 204.0 lb

## 2014-07-28 DIAGNOSIS — K219 Gastro-esophageal reflux disease without esophagitis: Secondary | ICD-10-CM

## 2014-07-28 DIAGNOSIS — E785 Hyperlipidemia, unspecified: Secondary | ICD-10-CM

## 2014-07-28 DIAGNOSIS — Z23 Encounter for immunization: Secondary | ICD-10-CM

## 2014-07-28 MED ORDER — TAMSULOSIN HCL 0.4 MG PO CAPS: 0.4000 mg | ORAL_CAPSULE | Freq: Every day | ORAL | Status: DC

## 2014-07-28 MED ORDER — TRAZODONE HCL 150 MG PO TABS: 150.0000 mg | ORAL_TABLET | Freq: Every day | ORAL | Status: DC

## 2014-07-28 MED ORDER — LINAGLIPTIN-METFORMIN HCL 2.5-1000 MG PO TABS: 1.0000 | ORAL_TABLET | Freq: Two times a day (BID) | ORAL | Status: DC

## 2014-07-28 MED ORDER — OMEPRAZOLE 40 MG PO CPDR: 40.0000 mg | DELAYED_RELEASE_CAPSULE | Freq: Two times a day (BID) | ORAL | Status: DC

## 2014-07-28 MED ORDER — GABAPENTIN 400 MG PO CAPS: 400.0000 mg | ORAL_CAPSULE | Freq: Three times a day (TID) | ORAL | Status: DC

## 2014-07-28 MED ORDER — ATORVASTATIN CALCIUM 80 MG PO TABS: 80.0000 mg | ORAL_TABLET | Freq: Every day | ORAL | Status: DC

## 2014-07-28 NOTE — Progress Notes (Signed)
   Subjective:    Patient ID: Dustin Cruz, male    DOB: December 28, 1965, 48 y.o.   MRN: [id number]  HPI  This 48 y.o. male presents for evaluation of needing refills of meds.  He was seen 8/15 for diabetes appointment with Cherre Robins and was started on short acting insulin.  He states his fsbs have been a lot better.  Review of Systems    No chest pain, SOB, HA, dizziness, vision change, N/V, diarrhea, constipation, dysuria, urinary urgency or frequency, myalgias, arthralgias or rash.  Objective:   Physical Exam Vital signs noted  Well developed well nourished male.  HEENT - Head atraumatic Normocephalic                Eyes - PERRLA, Conjuctiva - clear Sclera- Clear EOMI                Ears - EAC's Wnl TM's Wnl Gross Hearing WNL                Nose - Nares patent                 Throat - oropharanx wnl Respiratory - Lungs CTA bilateral Cardiac - RRR S1 and S2 without murmur GI - Abdomen soft Nontender and bowel sounds active x 4 Extremities - No edema. Neuro - Grossly intact.       Assessment & Plan:  Gastroesophageal reflux disease without esophagitis - Plan: omeprazole (PRILOSEC) 40 MG capsule  Hyperlipidemia - Plan: atorvastatin (LIPITOR) 80 MG tablet  Follow up for diabetes appointment with Tammy Eckard Pharm D in one month.  Lysbeth Penner FNP

## 2014-08-13 ENCOUNTER — Ambulatory Visit: Payer: Self-pay

## 2014-08-28 ENCOUNTER — Ambulatory Visit: Payer: Medicare Other

## 2014-09-09 ENCOUNTER — Other Ambulatory Visit: Payer: Self-pay | Admitting: Family Medicine

## 2014-09-17 ENCOUNTER — Encounter (HOSPITAL_COMMUNITY): Payer: Self-pay | Admitting: Internal Medicine

## 2014-09-21 ENCOUNTER — Encounter: Payer: Self-pay | Admitting: Family Medicine

## 2014-09-21 ENCOUNTER — Ambulatory Visit (INDEPENDENT_AMBULATORY_CARE_PROVIDER_SITE_OTHER): Payer: Medicare Other | Admitting: Family Medicine

## 2014-09-21 VITALS — BP 126/78 | HR 91 | Temp 98.2°F | Ht 73.5 in | Wt 196.2 lb

## 2014-09-21 DIAGNOSIS — J441 Chronic obstructive pulmonary disease with (acute) exacerbation: Secondary | ICD-10-CM

## 2014-09-21 MED ORDER — LEVALBUTEROL HCL 1.25 MG/3ML IN NEBU
1.2500 mg | INHALATION_SOLUTION | Freq: Once | RESPIRATORY_TRACT | Status: AC
  Administered 2014-09-21: 1.25 mg via RESPIRATORY_TRACT

## 2014-09-21 MED ORDER — METHYLPREDNISOLONE ACETATE 80 MG/ML IJ SUSP
80.0000 mg | Freq: Once | INTRAMUSCULAR | Status: AC
  Administered 2014-09-21: 80 mg via INTRAMUSCULAR

## 2014-09-21 MED ORDER — AMOXICILLIN 875 MG PO TABS: 875.0000 mg | ORAL_TABLET | Freq: Two times a day (BID) | ORAL | Status: DC

## 2014-09-21 MED ORDER — PREDNISONE 10 MG PO TABS: ORAL_TABLET | ORAL | Status: DC

## 2014-09-21 NOTE — Progress Notes (Signed)
   Subjective:    Patient ID: ALA CAPRI, male    DOB: 1966-05-13, 48 y.o.   MRN: [id number]  HPI This 49 y.o. male presents for evaluation of uri sx's and SOB.   Review of Systems No chest pain, SOB, HA, dizziness, vision change, N/V, diarrhea, constipation, dysuria, urinary urgency or frequency, myalgias, arthralgias or rash.     Objective:   Physical Exam Vital signs noted  Well developed well nourished male.  HEENT - Head atraumatic Normocephalic                Eyes - PERRLA, Conjuctiva - clear Sclera- Clear EOMI                Ears - EAC's Wnl TM's Wnl Gross Hearing WNL                Nose - Nares patent                 Throat - oropharanx wnl Respiratory - Lungs with scattered wheezes. Cardiac - RRR S1 and S2 without murmur GI - Abdomen soft Nontender and bowel sounds active x 4 Extremities - No edema. Neuro - Grossly intact.       Assessment & Plan:  COPD exacerbation - Plan: levalbuterol (XOPENEX) nebulizer solution 1.25 mg, methylPREDNISolone acetate (DEPO-MEDROL) injection 80 mg, amoxicillin (AMOXIL) 875 MG tablet, predniSONE (DELTASONE) 10 MG tablet  Push po fluids, rest, tylenol and motrin otc prn as directed for fever, arthralgias, and myalgias.  Follow up prn if sx's continue or persist.  Lysbeth Penner FNP

## 2014-10-19 ENCOUNTER — Ambulatory Visit (INDEPENDENT_AMBULATORY_CARE_PROVIDER_SITE_OTHER): Payer: Medicare Other | Admitting: Family Medicine

## 2014-10-19 ENCOUNTER — Encounter: Payer: Self-pay | Admitting: Family Medicine

## 2014-10-19 VITALS — BP 138/82 | HR 99 | Temp 97.0°F | Ht 73.5 in | Wt 195.0 lb

## 2014-10-19 DIAGNOSIS — G629 Polyneuropathy, unspecified: Secondary | ICD-10-CM

## 2014-10-19 DIAGNOSIS — R200 Anesthesia of skin: Secondary | ICD-10-CM

## 2014-10-19 DIAGNOSIS — K5900 Constipation, unspecified: Secondary | ICD-10-CM

## 2014-10-19 DIAGNOSIS — R202 Paresthesia of skin: Secondary | ICD-10-CM

## 2014-10-19 MED ORDER — LACTULOSE 20 GM/30ML PO SOLN: 20.0000 g | Freq: Two times a day (BID) | ORAL | Status: DC

## 2014-10-19 MED ORDER — GABAPENTIN 800 MG PO TABS: 800.0000 mg | ORAL_TABLET | Freq: Three times a day (TID) | ORAL | Status: DC

## 2014-10-19 NOTE — Progress Notes (Signed)
   Subjective:    Patient ID: Dustin Cruz, male    DOB: 11/22/1965, 49 y.o.   MRN: [id number]  HPI Patient is here for c/o bilateral lower abdominal pain and constipation.  He is c/o pain and numbness in his right foot.   He is diabetic and has hx of diabetic neuropathy.    Review of Systems  Constitutional: Negative for fever.  HENT: Negative for ear pain.   Eyes: Negative for discharge.  Respiratory: Negative for cough.   Cardiovascular: Negative for chest pain.  Gastrointestinal: Negative for abdominal distention.  Endocrine: Negative for polyuria.  Genitourinary: Negative for difficulty urinating.  Musculoskeletal: Negative for gait problem and neck pain.  Skin: Negative for color change and rash.  Neurological: Negative for speech difficulty and headaches.  Psychiatric/Behavioral: Negative for agitation.       Objective:    BP 138/82 mmHg  Pulse 99  Temp(Src) 97 F (36.1 C) (Oral)  Ht 6' 1.5" (1.867 m)  Wt 195 lb (88.451 kg)  BMI 25.38 kg/m2 Physical Exam  Constitutional: He is oriented to person, place, and time. He appears well-developed and well-nourished.  HENT:  Head: Normocephalic and atraumatic.  Mouth/Throat: Oropharynx is clear and moist.  Eyes: Pupils are equal, round, and reactive to light.  Neck: Normal range of motion. Neck supple.  Cardiovascular: Normal rate and regular rhythm.   No murmur heard. Pulmonary/Chest: Effort normal and breath sounds normal.  Abdominal: Soft. Bowel sounds are normal. There is no tenderness.  Neurological: He is alert and oriented to person, place, and time.  Skin: Skin is warm and dry.  Psychiatric: He has a normal mood and affect.          Assessment & Plan:     ICD-9-CM ICD-10-CM   1. Neuropathy 355.9 G62.9 gabapentin (NEURONTIN) 800 MG tablet  2. Constipation, unspecified constipation type 564.00 K59.00 Lactulose 20 GM/30ML SOLN  3. Numbness and tingling 782.0 R20.2 Lower Extremity Arterial Doppler Bilateral      No Follow-up on file.  Lysbeth Penner FNP

## 2014-10-27 ENCOUNTER — Other Ambulatory Visit: Payer: Self-pay | Admitting: Family Medicine

## 2014-10-27 DIAGNOSIS — I82403 Acute embolism and thrombosis of unspecified deep veins of lower extremity, bilateral: Secondary | ICD-10-CM

## 2014-11-02 ENCOUNTER — Ambulatory Visit: Payer: Medicare Other

## 2014-11-03 ENCOUNTER — Other Ambulatory Visit: Payer: Self-pay | Admitting: Family Medicine

## 2014-11-03 ENCOUNTER — Ambulatory Visit (HOSPITAL_COMMUNITY)
Admission: RE | Admit: 2014-11-03 | Discharge: 2014-11-03 | Disposition: A | Discharge status: Home / Self Care | Payer: Medicare Other | Source: Ambulatory Visit | Attending: Family Medicine | Admitting: Family Medicine

## 2014-11-03 DIAGNOSIS — G629 Polyneuropathy, unspecified: Secondary | ICD-10-CM

## 2014-11-03 DIAGNOSIS — F17200 Nicotine dependence, unspecified, uncomplicated: Secondary | ICD-10-CM | POA: Diagnosis not present

## 2014-11-03 DIAGNOSIS — M79671 Pain in right foot: Secondary | ICD-10-CM | POA: Insufficient documentation

## 2014-11-03 DIAGNOSIS — I10 Essential (primary) hypertension: Secondary | ICD-10-CM | POA: Insufficient documentation

## 2014-11-03 DIAGNOSIS — R200 Anesthesia of skin: Secondary | ICD-10-CM

## 2014-11-03 DIAGNOSIS — M79604 Pain in right leg: Secondary | ICD-10-CM | POA: Diagnosis present

## 2014-11-03 DIAGNOSIS — E785 Hyperlipidemia, unspecified: Secondary | ICD-10-CM | POA: Diagnosis not present

## 2014-11-09 ENCOUNTER — Ambulatory Visit: Payer: Self-pay

## 2015-01-12 ENCOUNTER — Ambulatory Visit (INDEPENDENT_AMBULATORY_CARE_PROVIDER_SITE_OTHER): Payer: Medicare Other | Admitting: Family Medicine

## 2015-01-12 ENCOUNTER — Encounter: Payer: Self-pay | Admitting: Family Medicine

## 2015-01-12 VITALS — BP 122/73 | HR 86 | Temp 98.6°F | Ht 73.5 in | Wt 195.0 lb

## 2015-01-12 DIAGNOSIS — Z113 Encounter for screening for infections with a predominantly sexual mode of transmission: Secondary | ICD-10-CM | POA: Diagnosis not present

## 2015-01-12 DIAGNOSIS — Z139 Encounter for screening, unspecified: Secondary | ICD-10-CM

## 2015-01-12 NOTE — Progress Notes (Signed)
S. 48 year old male who is here requesting testing for sexually transmitted diseases. Supposedly this is at the request of his girlfriend and he denies symptoms. He states that there is only one sexual partner. There is no rash discharge, etc. Likewise, he denies sexual partner having any symptoms.  O. since he is not having symptoms exam was not performed but we discussed the test that might be considered practical for STDs. These include chlamydia, gonorrhea, syphilis, HIV, and hepatitis B and C. He was given a choice but stated that he wanted all these tests done. I explained the procedure whereby we need a urine specimen as well as blood sample and he agreed  A,P. Will order tests as above and call him with results

## 2015-01-13 LAB — STD PANEL
HIV 1/O/2 Abs-Index Value: <1 (ref <1.00)
HIV-1/HIV-2 Ab: NONREACTIVE
Hepatitis B Surface Ag: NEGATIVE
RPR Ser Ql: NONREACTIVE

## 2015-01-13 LAB — HEPATITIS C ANTIBODY: Hep C Virus Ab: <0.1 s/co ratio (ref 0.0–0.9)

## 2015-01-14 LAB — GC/CHLAMYDIA PROBE AMP
CHLAMYDIA, DNA PROBE: NEGATIVE
Neisseria gonorrhoeae by PCR: NEGATIVE

## 2015-03-04 ENCOUNTER — Encounter: Payer: Self-pay | Admitting: Physician Assistant

## 2015-03-04 ENCOUNTER — Ambulatory Visit (INDEPENDENT_AMBULATORY_CARE_PROVIDER_SITE_OTHER): Payer: Medicare Other | Admitting: Physician Assistant

## 2015-03-04 VITALS — BP 139/86 | HR 85 | Temp 98.3°F | Ht 73.5 in | Wt 197.2 lb

## 2015-03-04 DIAGNOSIS — L309 Dermatitis, unspecified: Secondary | ICD-10-CM | POA: Diagnosis not present

## 2015-03-04 DIAGNOSIS — L03319 Cellulitis of trunk, unspecified: Secondary | ICD-10-CM

## 2015-03-04 DIAGNOSIS — T148 Other injury of unspecified body region: Secondary | ICD-10-CM

## 2015-03-04 DIAGNOSIS — W57XXXA Bitten or stung by nonvenomous insect and other nonvenomous arthropods, initial encounter: Secondary | ICD-10-CM | POA: Diagnosis not present

## 2015-03-04 MED ORDER — TRIAMCINOLONE ACETONIDE 0.1 % EX CREA: 1.0000 "application " | TOPICAL_CREAM | Freq: Two times a day (BID) | CUTANEOUS | Status: DC

## 2015-03-04 MED ORDER — DOXYCYCLINE HYCLATE 100 MG PO TABS: 100.0000 mg | ORAL_TABLET | Freq: Two times a day (BID) | ORAL | Status: DC

## 2015-03-04 NOTE — Progress Notes (Signed)
   Subjective:    Patient ID: Dustin Cruz, male    DOB: 04-Oct-1966, 49 y.o.   MRN: [id number]  HPI 49 y/o male presents with 2 tick bites -- left upper arm and back  yesterday . Continues to have itching, inflammation and redness. Thinks the ticks are removed but unsure.    Review of Systems  Skin:       Tick bite of left upper arm Tick bite on left back        Objective:   Physical Exam  Skin:  Erythema and inflammation of lesion on left upper arm and left back lateral to the upper buttocks. No tick noted to be intact           Assessment & Plan:  1. Tick bite  - doxycycline (VIBRA-TABS) 100 MG tablet; Take 1 tablet (100 mg total) by mouth 2 (two) times daily.  Dispense: 28 tablet; Refill: 0  2. Cellulitis of trunk, unspecified site  - doxycycline (VIBRA-TABS) 100 MG tablet; Take 1 tablet (100 mg total) by mouth 2 (two) times daily.  Dispense: 28 tablet; Refill: 0  3. Dermatitis - Zyrtec 10mg  daily for itch - triamcinolone cream (KENALOG) 0.1 %; Apply 1 application topically 2 (two) times daily.  Dispense: 30 g; Refill: 0   RTO if rash,HA, nausea/vomiting develop - instructions given to patient   Seaborn Nakama A. Benjamin Stain PA-C

## 2015-03-04 NOTE — Patient Instructions (Signed)
May take zyrtec 10mg  for itch or benadryl as directed every 4-6 hours    Tick Bite Information Ticks are insects that attach themselves to the skin. There are many types of ticks. Common types include wood ticks and deer ticks. Sometimes, ticks carry diseases that can make a person very ill. The most common places for ticks to attach themselves are the scalp, neck, armpits, waist, and groin.  HOW CAN YOU PREVENT TICK BITES? Take these steps to help prevent tick bites when you are outdoors:  Wear long sleeves and long pants.  Wear white clothes so you can see ticks more easily.  Tuck your pant legs into your socks.  If walking on a trail, stay in the middle of the trail to avoid brushing against bushes.  Avoid walking through areas with long grass.  Put bug spray on all skin that is showing and along boot tops, pant legs, and sleeve cuffs.  Check clothes, hair, and skin often and before going inside.  Brush off any ticks that are not attached.  Take a shower or bath as soon as possible after being outdoors. HOW SHOULD YOU REMOVE A TICK? Ticks should be removed as soon as possible to help prevent diseases. 1. If latex gloves are available, put them on before trying to remove a tick. 2. Use tweezers to grasp the tick as close to the skin as possible. You may also use curved forceps or a tick removal tool. Grasp the tick as close to its head as possible. Avoid grasping the tick on its body. 3. Pull gently upward until the tick lets go. Do not twist the tick or jerk it suddenly. This may break off the tick's head or mouth parts. 4. Do not squeeze or crush the tick's body. This could force disease-carrying fluids from the tick into your body. 5. After the tick is removed, wash the bite area and your hands with soap and water or alcohol. 6. Apply a small amount of antiseptic cream or ointment to the bite site. 7. Wash any tools that were used. Do not try to remove a tick by applying a hot  match, petroleum jelly, or fingernail polish to the tick. These methods do not work. They may also increase the chances of disease being spread from the tick bite. WHEN SHOULD YOU SEEK HELP? Contact your health care provider if you are unable to remove a tick or if a part of the tick breaks off in the skin. After a tick bite, you need to watch for signs and symptoms of diseases that can be spread by ticks. Contact your health care provider if you develop any of the following:  Fever.  Rash.  Redness and puffiness (swelling) in the area of the tick bite.  Tender, puffy lymph glands.  Watery poop (diarrhea).  Weight loss.  Cough.  Feeling more tired than normal (fatigue).  Muscle, joint, or bone pain.  Belly (abdominal) pain.  Headache.  Change in your level of consciousness.  Trouble walking or moving your legs.  Loss of feeling (numbness) in the legs.  Loss of movement (paralysis).  Shortness of breath.  Confusion.  Throwing up (vomiting) many times. Document Released: 12/20/2009 Document Revised: 05/28/2013 Document Reviewed: 03/05/2013 [facility] Patient Information 2015 [place], [place]. This information is not intended to replace advice given to you by your health care provider. Make sure you discuss any questions you have with your health care provider.

## 2015-04-05 ENCOUNTER — Other Ambulatory Visit: Payer: Self-pay

## 2015-05-13 ENCOUNTER — Ambulatory Visit: Payer: Medicare Other | Admitting: Family Medicine

## 2015-05-17 ENCOUNTER — Encounter: Payer: Self-pay | Admitting: Family Medicine

## 2015-05-17 ENCOUNTER — Ambulatory Visit (INDEPENDENT_AMBULATORY_CARE_PROVIDER_SITE_OTHER): Payer: Medicare Other | Admitting: Family Medicine

## 2015-05-17 VITALS — BP 135/88 | HR 83 | Temp 96.9°F | Ht 73.0 in | Wt 192.8 lb

## 2015-05-17 DIAGNOSIS — E1140 Type 2 diabetes mellitus with diabetic neuropathy, unspecified: Secondary | ICD-10-CM | POA: Diagnosis not present

## 2015-05-17 DIAGNOSIS — G629 Polyneuropathy, unspecified: Secondary | ICD-10-CM | POA: Diagnosis not present

## 2015-05-17 DIAGNOSIS — Z720 Tobacco use: Secondary | ICD-10-CM | POA: Diagnosis not present

## 2015-05-17 DIAGNOSIS — E559 Vitamin D deficiency, unspecified: Secondary | ICD-10-CM | POA: Diagnosis not present

## 2015-05-17 LAB — POCT GLYCOSYLATED HEMOGLOBIN (HGB A1C): Hemoglobin A1C: >14

## 2015-05-17 LAB — POCT UA - MICROALBUMIN: Microalbumin Ur, POC: NEGATIVE mg/L

## 2015-05-17 MED ORDER — INSULIN GLARGINE 100 UNIT/ML SOLOSTAR PEN: 62.0000 [IU] | PEN_INJECTOR | Freq: Every day | SUBCUTANEOUS | Status: DC

## 2015-05-17 NOTE — Assessment & Plan Note (Signed)
Discussed, recommended cessation

## 2015-05-17 NOTE — Patient Instructions (Signed)
Great to meet you!  Start your Lantus back, Start with 30 units daily and go up by 2 units every 2-3 days until your fasting blood sugar is 150-175  Come back in 1 month  Consider stopping smoking- 1800 Quit Now  Diet Recommendations for Diabetes   Starchy (carb) foods include: Bread, rice, pasta, potatoes, corn, crackers, bagels, muffins, all baked goods.   Protein foods include: Meat, fish, poultry, eggs, dairy foods, and beans such as pinto and kidney beans (beans also provide carbohydrate).   1. Eat at least 3 meals and 1-2 snacks per day. Never go more than 4-5 hours while awake without eating.  2. Limit starchy foods to TWO per meal and ONE per snack. ONE portion of a starchy  food is equal to the following:   - ONE slice of bread (or its equivalent, such as half of a hamburger bun).   - 1/2 cup of a "scoopable" starchy food such as potatoes or rice.   - 1 OUNCE (28 grams) of starchy snack foods such as crackers or pretzels (look on label).   - 15 grams of carbohydrate as shown on food label.  3. Both lunch and dinner should include a protein food, a carb food, and vegetables.   - Obtain twice as many veg's as protein or carbohydrate foods for both lunch and dinner.   - Try to keep frozen veg's on hand for a quick vegetable serving.     - Fresh or frozen veg's are best.  4. Breakfast should always include protein.

## 2015-05-17 NOTE — Assessment & Plan Note (Signed)
Continued, stable Helped some by gabapentin Improved diabetic control as primary strategy, continue gabapentin as well.

## 2015-05-17 NOTE — Assessment & Plan Note (Signed)
No replacement currently, recheck vitamin D level

## 2015-05-17 NOTE — Progress Notes (Signed)
Patient ID: Dustin Cruz, male   DOB: 02/06/1966, 48 y.o.   MRN: [id number]   HPI  Patient presents today for follow-up diabetes  Patient explains that he stopped taking his insulin about 3-4 months ago. He is continued to take Tradjenta plus metformin He is watching his diet, he also exercises by walking 20-30 minutes 3-4 times per day. He states that since the summer started and he is walking more easily lost about 15 pounds. He states that he insulin makes him feel and dehydrated so he stopped. He denies hypoglycemia, he repeats back to me that he can restart his Lantus at 30 units daily and increase it as I discussed with him.  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 He denies numbness and tingling of the hands. Gabapentin helps some.  Smoking Currently a half [place] per day smoker. He is thinking about quitting but not very motivated and not very opinionated about at this time.  PMH: Smoking status noted ROS: Per HPI  No chest pain, palpitations No dyspnea No abdominal pain No swelling   Objective: BP 135/88 mmHg  Pulse 83  Temp(Src) 96.9 F (36.1 C) (Oral)  Ht 6' 1" (1.854 m)  Wt 192 lb 12.8 oz (87.454 kg)  BMI 25.44 kg/m2 Gen: NAD, alert, cooperative with exam HEENT: NCAT CV: RRR, good S1/S2, no murmur Resp: CTABL, no wheezes, non-labored Abd: SNTND, BS present, no guarding or organomegaly , well-healed scar on the left upper quadrant Ext: No edema, warm Neuro: Alert and oriented, No gross deficits  Diabetic foot exam No lesions 2+ DP pulses bilaterally Sensation intact to monofilament except for bilateral heel pads and thick calluses on bilateral lateral distal foot  Assessment and plan:  Tobacco abuse Discussed, recommended cessation  Vitamin D deficiency No replacement currently, recheck vitamin D level  Hyperlipidemia Tolerating meds Watching his diet and exercising  routinely Continue statin  Diabetes mellitus, type 2 Uncontrolled, he's had a drastic change since his last A1c check, has changed from 7.9 to greater than 14. Checking labs today Restart insulin, start with half of his previous dose at 30 units and increase by 2 every 2-3 days until fasting blood sugar is 1 5175. Follow-up one month Likely will discontinue Tradjenta and continue with metformin plus insulin alone. Will likely ask for follow-up with clinical pharmacist after next month's visit.   Peripheral neuropathy Continued, stable Helped some by gabapentin Improved diabetic control as primary strategy, continue gabapentin as well.   Orders Placed This Encounter  Procedures  . Lipid Panel  . CMP14+EGFR  . Vit D  25 hydroxy (rtn osteoporosis monitoring)  . CBC with Differential/Platelet  . POCT glycosylated hemoglobin (Hb A1C)  . POCT UA - Microalbumin    Meds ordered this encounter  Medications  . Insulin Glargine (LANTUS) 100 UNIT/ML Solostar Pen    Sig: Inject 62 Units into the skin at bedtime.    Dispense:  30 mL    Refill:  2    Starting with 30 and tiotrating back up to likely 60 units daily.     

## 2015-05-17 NOTE — Assessment & Plan Note (Signed)
Uncontrolled, he's had a drastic change since his last A1c check, has changed from 7.9 to greater than 14. Checking labs today Restart insulin, start with half of his previous dose at 30 units and increase by 2 every 2-3 days until fasting blood sugar is 1 5175. Follow-up one month Likely will discontinue Tradjenta and continue with metformin plus insulin alone. Will likely ask for follow-up with clinical pharmacist after next month's visit.

## 2015-05-17 NOTE — Assessment & Plan Note (Signed)
Tolerating meds Watching his diet and exercising routinely Continue statin

## 2015-05-18 ENCOUNTER — Telehealth: Payer: Self-pay | Admitting: Family Medicine

## 2015-05-18 DIAGNOSIS — E781 Pure hyperglyceridemia: Secondary | ICD-10-CM | POA: Insufficient documentation

## 2015-05-18 LAB — CBC WITH DIFFERENTIAL/PLATELET
BASOS: 1 %
Basophils Absolute: 0 10*3/uL (ref 0.0–0.2)
EOS (ABSOLUTE): 0.1 10*3/uL (ref 0.0–0.4)
Eos: 1 %
Hematocrit: 49.2 % (ref 37.5–51.0)
Hemoglobin: 16.9 g/dL (ref 12.6–17.7)
IMMATURE GRANULOCYTES: 0 %
Immature Grans (Abs): 0 10*3/uL (ref 0.0–0.1)
LYMPHS ABS: 2.6 10*3/uL (ref 0.7–3.1)
Lymphs: 36 %
MCH: 32.4 pg (ref 26.6–33.0)
MCHC: 34.3 g/dL (ref 31.5–35.7)
MCV: 94 fL (ref 79–97)
Monocytes Absolute: 0.6 10*3/uL (ref 0.1–0.9)
Monocytes: 8 %
NEUTROS ABS: 3.9 10*3/uL (ref 1.4–7.0)
NEUTROS PCT: 54 %
PLATELETS: 297 10*3/uL (ref 150–379)
RBC: 5.21 x10E6/uL (ref 4.14–5.80)
RDW: 13.1 % (ref 12.3–15.4)
WBC: 7.1 10*3/uL (ref 3.4–10.8)

## 2015-05-18 LAB — CMP14+EGFR
ALK PHOS: 62 IU/L (ref 39–117)
ALT: 10 IU/L (ref 0–44)
AST: 10 IU/L (ref 0–40)
Albumin/Globulin Ratio: 1.6 (ref 1.1–2.5)
Albumin: 3.9 g/dL (ref 3.5–5.5)
BUN/Creatinine Ratio: 13 (ref 9–20)
BUN: 11 mg/dL (ref 6–24)
Bilirubin Total: 0.4 mg/dL (ref 0.0–1.2)
CALCIUM: 9.4 mg/dL (ref 8.7–10.2)
CHLORIDE: 93 mmol/L — AB (ref 97–108)
CO2: 20 mmol/L (ref 18–29)
Creatinine, Ser: 0.85 mg/dL (ref 0.76–1.27)
GFR calc Af Amer: 118 mL/min/{1.73_m2} (ref >59)
GFR, EST NON AFRICAN AMERICAN: 102 mL/min/{1.73_m2} (ref >59)
Globulin, Total: 2.5 g/dL (ref 1.5–4.5)
Glucose: 384 mg/dL — ABNORMAL HIGH (ref 65–99)
Potassium: 4.7 mmol/L (ref 3.5–5.2)
Sodium: 136 mmol/L (ref 134–144)
Total Protein: 6.4 g/dL (ref 6.0–8.5)

## 2015-05-18 LAB — LIPID PANEL
CHOL/HDL RATIO: 5.7 ratio — AB (ref 0.0–5.0)
Cholesterol, Total: 206 mg/dL — ABNORMAL HIGH (ref 100–199)
HDL: 36 mg/dL — AB (ref >39)
Triglycerides: 719 mg/dL (ref 0–149)

## 2015-05-18 LAB — VITAMIN D 25 HYDROXY (VIT D DEFICIENCY, FRACTURES): Vit D, 25-Hydroxy: 19 ng/mL — ABNORMAL LOW (ref 30.0–100.0)

## 2015-05-18 NOTE — Telephone Encounter (Signed)
I called to discuss his labs  His glucose is very elevated, he started taking Lantus last night. He states that he started at 16 units, I encouraged him to start at half of his previous dose which would be 30 units, however I am honestly glad that he is started the insulin whenever dose. He understands to increase the units by 2 units every 2-3 days until his fasting blood sugars around 150.  Note he is not acidotic- HCO3 is 20.   His triglycerides are very elevated. He confirms that he was fasting before the test. Considering how high this isn't definitive repeat a lipid panel to confirm and then possibly start him on fenofibrate to treat his hypertriglyceridemia.  I will last nursing to call the patient in finish coordination of care.  I am concerned about health literacy with him and will attempt to keep his diabetic treatment straight forward. He seems to understand lantus and increasung the dose well.   Laroy Apple, MD [facility] 05/18/2015, 11:13 AM

## 2015-05-18 NOTE — Telephone Encounter (Signed)
Pt coming in tomorrow morning for labs

## 2015-05-19 ENCOUNTER — Other Ambulatory Visit: Payer: Medicare Other

## 2015-06-03 ENCOUNTER — Encounter: Payer: Self-pay | Admitting: Family Medicine

## 2015-06-03 ENCOUNTER — Ambulatory Visit (INDEPENDENT_AMBULATORY_CARE_PROVIDER_SITE_OTHER): Payer: Medicare Other | Admitting: Family Medicine

## 2015-06-03 VITALS — BP 127/75 | HR 100 | Temp 97.9°F | Ht 73.0 in | Wt 194.0 lb

## 2015-06-03 DIAGNOSIS — R197 Diarrhea, unspecified: Secondary | ICD-10-CM

## 2015-06-03 DIAGNOSIS — A084 Viral intestinal infection, unspecified: Secondary | ICD-10-CM | POA: Insufficient documentation

## 2015-06-03 NOTE — Progress Notes (Signed)
BP 127/75 mmHg  Pulse 100  Temp(Src) 97.9 F (36.6 C) (Oral)  Ht '6\' 1"'  (1.854 m)  Wt 194 lb (87.998 kg)  BMI 25.60 kg/m2   Subjective:    Patient ID: Dustin Cruz, male    DOB: 1966/07/17, 49 y.o.   MRN: [id number]  HPI: Dustin Cruz is a 49 y.o. male presenting on 06/03/2015 for Abdominal Pain and Diarrhea   HPI Stomach flu Patient presents today with a half-day history of stomach issues. He has been having diarrhea 3 this morning and it is greenish in appearance. He denies any blood in his stool. He denies any nausea and vomiting but does have epigastric discomfort and bloating along with it. He denies any fevers or chills. He denies any sick contacts or eating anything new over the last 24 hours.  Relevant past medical, surgical, family and social history reviewed and updated as indicated. Interim medical history since our last visit reviewed. Allergies and medications reviewed and updated.  Review of Systems  Constitutional: Negative for fever and chills.  HENT: Negative for ear discharge and ear pain.   Eyes: Negative for discharge and visual disturbance.  Respiratory: Negative for shortness of breath and wheezing.   Cardiovascular: Negative for chest pain and leg swelling.  Gastrointestinal: Positive for abdominal pain and diarrhea. Negative for nausea, vomiting and constipation.  Genitourinary: Negative for difficulty urinating.  Musculoskeletal: Negative for back pain and gait problem.  Skin: Negative for rash.  Neurological: Negative for dizziness, syncope, light-headedness and headaches.  All other systems reviewed and are negative.   Per HPI unless specifically indicated above     Medication List       This list is accurate as of: 06/03/15  3:13 PM.  Always use your most recent med list.               albuterol 108 (90 BASE) MCG/ACT inhaler  Commonly known as:  PROVENTIL HFA;VENTOLIN HFA  Inhale 2 puffs into the lungs every 6 (six) hours as needed for  wheezing.     albuterol (2.5 MG/3ML) 0.083% nebulizer solution  Commonly known as:  PROVENTIL  USE ONE  IN NEBULIZER TWICE DAILY AS NEEDED FOR WHEEZING OR SHORTNESS OF BREATH     aspirin EC 81 MG tablet  Take 81 mg by mouth daily.     atorvastatin 80 MG tablet  Commonly known as:  LIPITOR  Take 1 tablet (80 mg total) by mouth daily.     gabapentin 800 MG tablet  Commonly known as:  NEURONTIN  Take 1 tablet (800 mg total) by mouth 3 (three) times daily.     insulin aspart 100 UNIT/ML FlexPen  Commonly known as:  NOVOLOG FLEXPEN  Inject up to 10 units prior to meal per sliding scale.     Insulin Glargine 100 UNIT/ML Solostar Pen  Commonly known as:  LANTUS  Inject 62 Units into the skin at bedtime.     Linagliptin-Metformin HCl 2.02-999 MG Tabs  Commonly known as:  JENTADUETO  Take 1 tablet by mouth 2 (two) times daily with a meal.     nitroGLYCERIN 0.4 MG SL tablet  Commonly known as:  NITROSTAT  Place 1 tablet (0.4 mg total) under the tongue every 5 (five) minutes as needed. For chest pain.     omeprazole 40 MG capsule  Commonly known as:  PRILOSEC  Take 1 capsule (40 mg total) by mouth 2 (two) times daily.     sertraline 100 MG  tablet  Commonly known as:  ZOLOFT  Take 100 mg by mouth daily.     traZODone 150 MG tablet  Commonly known as:  DESYREL  Take 1 tablet (150 mg total) by mouth at bedtime.           Objective:    BP 127/75 mmHg  Pulse 100  Temp(Src) 97.9 F (36.6 C) (Oral)  Ht '6\' 1"'  (1.854 m)  Wt 194 lb (87.998 kg)  BMI 25.60 kg/m2  Wt Readings from Last 3 Encounters:  06/03/15 194 lb (87.998 kg)  05/17/15 192 lb 12.8 oz (87.454 kg)  03/04/15 197 lb 3.2 oz (89.449 kg)    Physical Exam  Constitutional: He is oriented to person, place, and time. He appears well-developed and well-nourished. No distress.  Eyes: Conjunctivae and EOM are normal. Right eye exhibits no discharge. No scleral icterus.  Cardiovascular: Normal rate, regular rhythm,  normal heart sounds and intact distal pulses.   No murmur heard. Pulmonary/Chest: Effort normal and breath sounds normal. No respiratory distress. He has no wheezes.  Abdominal: Soft. Bowel sounds are normal. He exhibits no distension. There is tenderness. There is no rebound and no guarding.  Musculoskeletal: Normal range of motion. He exhibits no edema.  Neurological: He is alert and oriented to person, place, and time. Coordination normal.  Skin: Skin is warm and dry. No rash noted. He is not diaphoretic.  Psychiatric: He has a normal mood and affect. His behavior is normal.  Vitals reviewed.   Results for orders placed or performed in visit on 05/17/15  Lipid Panel  Result Value Ref Range   Cholesterol, Total 206 (H) 100 - 199 mg/dL   Triglycerides 719 (HH) 0 - 149 mg/dL   HDL 36 (L) >39 mg/dL   VLDL Cholesterol Cal Comment 5 - 40 mg/dL   LDL Calculated Comment 0 - 99 mg/dL   Chol/HDL Ratio 5.7 (H) 0.0 - 5.0 ratio units  CMP14+EGFR  Result Value Ref Range   Glucose 384 (H) 65 - 99 mg/dL   BUN 11 6 - 24 mg/dL   Creatinine, Ser 0.85 0.76 - 1.27 mg/dL   GFR calc non Af Amer 102 >59 mL/min/1.73   GFR calc Af Amer 118 >59 mL/min/1.73   BUN/Creatinine Ratio 13 9 - 20   Sodium 136 134 - 144 mmol/L   Potassium 4.7 3.5 - 5.2 mmol/L   Chloride 93 (L) 97 - 108 mmol/L   CO2 20 18 - 29 mmol/L   Calcium 9.4 8.7 - 10.2 mg/dL   Total Protein 6.4 6.0 - 8.5 g/dL   Albumin 3.9 3.5 - 5.5 g/dL   Globulin, Total 2.5 1.5 - 4.5 g/dL   Albumin/Globulin Ratio 1.6 1.1 - 2.5   Bilirubin Total 0.4 0.0 - 1.2 mg/dL   Alkaline Phosphatase 62 39 - 117 IU/L   AST 10 0 - 40 IU/L   ALT 10 0 - 44 IU/L  Vit D  25 hydroxy (rtn osteoporosis monitoring)  Result Value Ref Range   Vit D, 25-Hydroxy 19.0 (L) 30.0 - 100.0 ng/mL  CBC with Differential/Platelet  Result Value Ref Range   WBC 7.1 3.4 - 10.8 x10E3/uL   RBC 5.21 4.14 - 5.80 x10E6/uL   Hemoglobin 16.9 12.6 - 17.7 g/dL   Hematocrit 49.2 37.5 - 51.0  %   MCV 94 79 - 97 fL   MCH 32.4 26.6 - 33.0 pg   MCHC 34.3 31.5 - 35.7 g/dL   RDW 13.1 12.3 - 15.4 %  Platelets 297 150 - 379 x10E3/uL   Neutrophils 54 %   Lymphs 36 %   Monocytes 8 %   Eos 1 %   Basos 1 %   Neutrophils Absolute 3.9 1.4 - 7.0 x10E3/uL   Lymphocytes Absolute 2.6 0.7 - 3.1 x10E3/uL   Monocytes Absolute 0.6 0.1 - 0.9 x10E3/uL   EOS (ABSOLUTE) 0.1 0.0 - 0.4 x10E3/uL   Basophils Absolute 0.0 0.0 - 0.2 x10E3/uL   Immature Granulocytes 0 %   Immature Grans (Abs) 0.0 0.0 - 0.1 x10E3/uL  POCT glycosylated hemoglobin (Hb A1C)  Result Value Ref Range   Hemoglobin A1C >14   POCT UA - Microalbumin  Result Value Ref Range   Microalbumin Ur, POC negative mg/L      Assessment & Plan:   Problem List Items Addressed This Visit      Digestive   Viral gastroenteritis - Primary    Patient has half-day history of diarrhea and general malaise. Denies nausea or vomiting, still has urine output. Likely viral gastroenteritis and will be self-limiting in 24-72 hours. Instructed patient to stay hydrated and eat foods that are tolerant to his stomach. He are he has omeprazole, recommended that he could get some Imodium.          Follow up plan: Return in about 4 weeks (around 07/01/2015), or if symptoms worsen or fail to improve.  Caryl Pina, MD [facility] 06/03/2015, 3:13 PM

## 2015-06-03 NOTE — Assessment & Plan Note (Signed)
Patient has half-day history of diarrhea and general malaise. Denies nausea or vomiting, still has urine output. Likely viral gastroenteritis and will be self-limiting in 24-72 hours. Instructed patient to stay hydrated and eat foods that are tolerant to his stomach. He are he has omeprazole, recommended that he could get some Imodium.

## 2015-06-03 NOTE — Patient Instructions (Signed)

## 2015-06-17 ENCOUNTER — Encounter: Payer: Self-pay | Admitting: Family Medicine

## 2015-06-17 ENCOUNTER — Ambulatory Visit (INDEPENDENT_AMBULATORY_CARE_PROVIDER_SITE_OTHER): Payer: Medicare Other | Admitting: Family Medicine

## 2015-06-17 VITALS — BP 136/80 | HR 92 | Temp 97.2°F | Ht 73.0 in | Wt 190.2 lb

## 2015-06-17 DIAGNOSIS — E781 Pure hyperglyceridemia: Secondary | ICD-10-CM

## 2015-06-17 DIAGNOSIS — E785 Hyperlipidemia, unspecified: Secondary | ICD-10-CM | POA: Diagnosis not present

## 2015-06-17 NOTE — Patient Instructions (Addendum)
Your diabetes control is improving but is not perfect yet.   Increase your lantus 2 units every weekend until your fasting blood sugar is between 150 and 200.   Please record your blood sugars so we can work on your novolog dose.   Make an appointment to see Tammy next month, plan on see me in mid-November to check your A1C again.   Diet Recommendations for Diabetes   Starchy (carb) foods include: Bread, rice, pasta, potatoes, corn, crackers, bagels, muffins, all baked goods.   Protein foods include: Meat, fish, poultry, eggs, dairy foods, and beans such as pinto and kidney beans (beans also provide carbohydrate).   1. Eat at least 3 meals and 1-2 snacks per day. Never go more than 4-5 hours while awake without eating.  2. Limit starchy foods to TWO per meal and ONE per snack. ONE portion of a starchy  food is equal to the following:   - ONE slice of bread (or its equivalent, such as half of a hamburger bun).   - 1/2 cup of a "scoopable" starchy food such as potatoes or rice.   - 1 OUNCE (28 grams) of starchy snack foods such as crackers or pretzels (look on label).   - 15 grams of carbohydrate as shown on food label.  3. Both lunch and dinner should include a protein food, a carb food, and vegetables.   - Obtain twice as many veg's as protein or carbohydrate foods for both lunch and dinner.   - Try to keep frozen veg's on hand for a quick vegetable serving.     - Fresh or frozen veg's are best.  4. Breakfast should always include protein.

## 2015-06-17 NOTE — Progress Notes (Signed)
   HPI  Patient presents today here for diabetes recheck  Patient was found to have an elevated A1c last month greater than 14. Since that time he states that he started taking Lantus again every day. He started back and 10 units a day and increased to 12 units. His fasting blood sugars are now around 200.  He states that he takes the orange thin, NovoLog, around 18 units if his blood sugar is over 300 which happens maybe twice a week.  He's had one episode of hypoglycemia characterized by loss of energy and sweating when his blood sugar was measured at 75. He drank an entire Dr. Malachi Bonds to recover.  He continues to take Tradjenta and metformin  He is a smoker, not interested in quitting.  soking status noted ROS: Per HPI  Objective: BP 136/80 mmHg  Pulse 92  Temp(Src) 97.2 F (36.2 C) (Oral)  Ht 6\' 1"  (1.854 m)  Wt 190 lb 3.2 oz (86.274 kg)  BMI 25.10 kg/m2 Gen: NAD, alert, cooperative with exam HEENT: NCAT,  Conjunctivitis bilaterally CV: RRR, good S1/S2, no murmur Resp: CTABL, no wheezes, non-labored Ext: No edema, warm Neuro: Alert and oriented, No gross deficits  Assessment and plan:  type 2 diabetes Improbving, not controlled but improving Titrate lantus Record CBGs for novolog titration Continue tradjentamet  # Hypertriglyceridemia Very elevated triglycerides last visit, possibly due to uncontrolled diabetes Recheck lipid panel Emphasized Lipitor after last lab draw Start fibrateif not improved less than 500  # Abdominal pain-resolved  # Tobacco abuse Counseled, not interested in quitting   Orders Placed This Encounter  Procedures  . Lipid panel    Meds ordered this encounter  Medications  . sertraline (ZOLOFT) 50 MG tablet    Sig:     Laroy Apple, MD [facility] 06/17/2015, 9:49 AM

## 2015-06-18 LAB — LIPID PANEL
CHOLESTEROL TOTAL: 195 mg/dL (ref 100–199)
Chol/HDL Ratio: 4.8 ratio units (ref 0.0–5.0)
HDL: 41 mg/dL (ref >39)
Triglycerides: 434 mg/dL — ABNORMAL HIGH (ref 0–149)

## 2015-06-22 ENCOUNTER — Other Ambulatory Visit: Payer: Self-pay | Admitting: *Deleted

## 2015-06-22 MED ORDER — FENOFIBRATE MICRONIZED 130 MG PO CAPS: 130.0000 mg | ORAL_CAPSULE | Freq: Every day | ORAL | Status: DC

## 2015-07-09 ENCOUNTER — Ambulatory Visit (INDEPENDENT_AMBULATORY_CARE_PROVIDER_SITE_OTHER): Payer: Medicare Other

## 2015-07-09 DIAGNOSIS — Z23 Encounter for immunization: Secondary | ICD-10-CM | POA: Diagnosis not present

## 2015-07-13 ENCOUNTER — Other Ambulatory Visit: Payer: Self-pay

## 2015-07-13 MED ORDER — LINAGLIPTIN-METFORMIN HCL 2.5-1000 MG PO TABS: 1.0000 | ORAL_TABLET | Freq: Two times a day (BID) | ORAL | Status: DC

## 2015-07-19 ENCOUNTER — Ambulatory Visit: Payer: Medicare Other | Admitting: Pharmacist

## 2015-07-21 ENCOUNTER — Encounter: Payer: Self-pay | Admitting: *Deleted

## 2015-07-26 ENCOUNTER — Ambulatory Visit: Payer: Medicare Other | Admitting: Pharmacist

## 2015-08-10 ENCOUNTER — Encounter: Payer: Self-pay | Admitting: Nurse Practitioner

## 2015-08-10 ENCOUNTER — Ambulatory Visit (INDEPENDENT_AMBULATORY_CARE_PROVIDER_SITE_OTHER): Payer: Medicare Other | Admitting: Nurse Practitioner

## 2015-08-10 VITALS — BP 123/77 | HR 107 | Temp 98.1°F | Ht 73.0 in | Wt 188.0 lb

## 2015-08-10 DIAGNOSIS — J0100 Acute maxillary sinusitis, unspecified: Secondary | ICD-10-CM | POA: Diagnosis not present

## 2015-08-10 DIAGNOSIS — J069 Acute upper respiratory infection, unspecified: Secondary | ICD-10-CM

## 2015-08-10 MED ORDER — AMOXICILLIN-POT CLAVULANATE 875-125 MG PO TABS: 1.0000 | ORAL_TABLET | Freq: Two times a day (BID) | ORAL | Status: DC

## 2015-08-10 MED ORDER — HYDROCODONE-HOMATROPINE 5-1.5 MG/5ML PO SYRP: 5.0000 mL | ORAL_SOLUTION | Freq: Four times a day (QID) | ORAL | Status: DC | PRN

## 2015-08-10 NOTE — Patient Instructions (Signed)

## 2015-08-10 NOTE — Progress Notes (Signed)
  Subjective:     Dustin Cruz is a 49 y.o. male who presents for evaluation of sinus pain. Symptoms include: clear rhinorrhea, congestion, cough, facial pain and headaches. Onset of symptoms was 1 day ago. Symptoms have been gradually worsening since that time. Past history is significant for COPD. Patient is a smoker  (1 ppd x 35 yrs).  The following portions of the patient's history were reviewed and updated as appropriate: allergies, current medications, past family history, past medical history, past social history, past surgical history and problem list.  Review of Systems Pertinent items are noted in HPI.   Objective:    BP 123/77 mmHg  Pulse 107  Temp(Src) 98.1 F (36.7 C) (Oral)  Ht 6\' 1"  (1.854 m)  Wt 188 lb (85.276 kg)  BMI 24.81 kg/m2 Eyes: positive findings: sclera injection bil Ears: normal TM's and external ear canals both ears Nose: clear discharge, moderate congestion, turbinates pale, sinus tenderness bilateral Throat: lips, mucosa, and tongue normal; teeth and gums normal Neck: no adenopathy, no carotid bruit, no JVD, supple, symmetrical, trachea midline and thyroid not enlarged, symmetric, no tenderness/mass/nodules Lungs: rhonchi bibasilar Heart: regular rate and rhythm, S1, S2 normal, no murmur, click, rub or gallop    Assessment:    Acute bacterial sinusitis.With acute exacerbation of chronic bronchitis    Plan:  1. Take meds as prescribed 2. Use a cool mist humidifier especially during the winter months and when heat has been humid. 3. Use saline nose sprays frequently 4. Saline irrigations of the nose can be very helpful if done frequently.  * 4X daily for 1 week*  * Use of a nettie pot can be helpful with this. Follow directions with this* 5. Drink plenty of fluids 6. Keep thermostat turn down low 7.For any cough or congestion  Use plain Mucinex- regular strength or max strength is fine   * Children- consult with Pharmacist for dosing 8. For fever  or aces or pains- take tylenol or ibuprofen appropriate for age and weight.  * for fevers greater than 101 orally you may alternate ibuprofen and tylenol every  3 hours.   Meds ordered this encounter  Medications  . amoxicillin-clavulanate (AUGMENTIN) 875-125 MG tablet    Sig: Take 1 tablet by mouth 2 (two) times daily.    Dispense:  20 tablet    Refill:  0    Order Specific Question:  Supervising Provider    Answer:  Chipper Herb [[id number]]   [place], FNP

## 2015-08-17 ENCOUNTER — Other Ambulatory Visit: Payer: Self-pay | Admitting: *Deleted

## 2015-08-18 ENCOUNTER — Other Ambulatory Visit: Payer: Self-pay | Admitting: *Deleted

## 2015-08-18 DIAGNOSIS — E785 Hyperlipidemia, unspecified: Secondary | ICD-10-CM

## 2015-08-18 MED ORDER — ATORVASTATIN CALCIUM 80 MG PO TABS: 80.0000 mg | ORAL_TABLET | Freq: Every day | ORAL | Status: DC

## 2015-08-20 ENCOUNTER — Ambulatory Visit: Payer: Medicare Other | Admitting: Family Medicine

## 2015-08-23 ENCOUNTER — Encounter: Payer: Self-pay | Admitting: Family Medicine

## 2015-08-23 ENCOUNTER — Ambulatory Visit (INDEPENDENT_AMBULATORY_CARE_PROVIDER_SITE_OTHER): Payer: Medicare Other | Admitting: Family Medicine

## 2015-08-23 VITALS — BP 139/87 | HR 79 | Temp 96.8°F | Ht 73.0 in | Wt 192.2 lb

## 2015-08-23 DIAGNOSIS — E1140 Type 2 diabetes mellitus with diabetic neuropathy, unspecified: Secondary | ICD-10-CM

## 2015-08-23 DIAGNOSIS — Z794 Long term (current) use of insulin: Secondary | ICD-10-CM

## 2015-08-23 DIAGNOSIS — E785 Hyperlipidemia, unspecified: Secondary | ICD-10-CM

## 2015-08-23 DIAGNOSIS — E0842 Diabetes mellitus due to underlying condition with diabetic polyneuropathy: Secondary | ICD-10-CM

## 2015-08-23 DIAGNOSIS — E781 Pure hyperglyceridemia: Secondary | ICD-10-CM

## 2015-08-23 DIAGNOSIS — H6122 Impacted cerumen, left ear: Secondary | ICD-10-CM | POA: Diagnosis not present

## 2015-08-23 LAB — POCT GLYCOSYLATED HEMOGLOBIN (HGB A1C)

## 2015-08-23 MED ORDER — MOMETASONE FUROATE 50 MCG/ACT NA SUSP
NASAL | Status: DC
Start: 2015-08-23 — End: 2015-09-22

## 2015-08-23 NOTE — Patient Instructions (Signed)
Great to see you!  Letsget you to see Dustin Cruz in 1 month for diabetes, come back to me in 3 months  Re-start Lantus at 30 units daily, increase by 2 units every 3-4 days until your fasting blood sugar is 150 to 200, do not go higher than 40 units before you come back

## 2015-08-23 NOTE — Progress Notes (Signed)
   HPI  Patient presents today here for diabetes follow-up, sinus headache, cerumen impaction.  Diabetes Fasting blood sugar average is 203 100 No low blood sugars He now admits that he has stopped taking his Lantus over the last 2-3 months. Recently he was tolerating 30-40 units daily without problem. He denies polyuria, he does have polydipsia He also has bilateral feet numbness and tingling in his toes. He has good med compliance with jentaduetto  Impacted cerumen Left ear feels like it's full of earwax, passively get it removed today.  Sinus headache Has sinus congestion and right frontal headache described as pressure-like headache he attributes to sinus pressure. Just finished his course of Augmentin  PMH: Smoking status noted ROS: Per HPI  Objective: BP 139/87 mmHg  Pulse 79  Temp(Src) 96.8 F (36 C) (Oral)  Ht $R'6\' 1"'fz$  (1.854 m)  Wt 192 lb 3.2 oz (87.181 kg)  BMI 25.36 kg/m2 Gen: NAD, alert, cooperative with exam HEENT: NCAT, erythematous nares, right TM normal, left TM obscured by cerumen CV: RRR, good S1/S2, no murmur Resp: CTABL, no wheezes, non-labored Ext: No edema, warm Neuro: Alert and oriented, No gross deficits  Diabetic foot exam: 2 clusters Celsius pulses Sensation intact to monofilament from forefoot back, loss of sensation across toes and distal forefoot No lesions  Assessment and plan:  # Diabetes, uncontrolled A1c greater than 14 today One-month follow-up with clinical pharmacist Restart Lantus at 30 units daily, discussed titration Continue jentaduetto Labs Now has diabetic neuropathy as well Foot exam as above Ophthalmology referral  # Impacted cerumen Washed out in clinic today  # sinus pressure Just finished augmentin, add nasonex Stop afrin- discussed rebound symptoms and recommended waiting it out  # Hypertriglyceridemia hyperlipidemia He has started levothyroid and is tolerating easily, I would very much like to discontinue  this if we get his blood sugar under control as it is likely his triglycerides come down significantly and his DM2 was controlled. Labs Continue statin and fibrate for now    Orders Placed This Encounter  Procedures  . CMP14+EGFR  . Lipid Panel  . Ambulatory referral to Ophthalmology    Referral Priority:  Routine    Referral Type:  Consultation    Referral Reason:  Specialty Services Required    Requested Specialty:  Ophthalmology    Number of Visits Requested:  1  . POCT glycosylated hemoglobin (Hb A1C)    Meds ordered this encounter  Medications  . mometasone (NASONEX) 50 MCG/ACT nasal spray    Sig: 2 sprays each nostril daily    Dispense:  17 g    Refill:  [place], MD [place] 08/23/2015, 8:45 AM

## 2015-08-24 LAB — LIPID PANEL
CHOLESTEROL TOTAL: 294 mg/dL — AB (ref 100–199)
Chol/HDL Ratio: 5.9 ratio units — ABNORMAL HIGH (ref 0.0–5.0)
HDL: 50 mg/dL (ref >39)
LDL CALC: 178 mg/dL — AB (ref 0–99)
Triglycerides: 332 mg/dL — ABNORMAL HIGH (ref 0–149)
VLDL Cholesterol Cal: 66 mg/dL — ABNORMAL HIGH (ref 5–40)

## 2015-08-24 LAB — CMP14+EGFR
ALK PHOS: 50 IU/L (ref 39–117)
ALT: 13 IU/L (ref 0–44)
AST: 10 IU/L (ref 0–40)
Albumin/Globulin Ratio: 1.7 (ref 1.1–2.5)
Albumin: 4 g/dL (ref 3.5–5.5)
BILIRUBIN TOTAL: 0.4 mg/dL (ref 0.0–1.2)
BUN/Creatinine Ratio: 15 (ref 9–20)
BUN: 15 mg/dL (ref 6–24)
CHLORIDE: 93 mmol/L — AB (ref 97–106)
CO2: 26 mmol/L (ref 18–29)
CREATININE: 0.98 mg/dL (ref 0.76–1.27)
Calcium: 9.6 mg/dL (ref 8.7–10.2)
GFR calc non Af Amer: 90 mL/min/{1.73_m2} (ref >59)
GFR, EST AFRICAN AMERICAN: 103 mL/min/{1.73_m2} (ref >59)
GLUCOSE: 350 mg/dL — AB (ref 65–99)
Globulin, Total: 2.3 g/dL (ref 1.5–4.5)
Potassium: 4.6 mmol/L (ref 3.5–5.2)
Sodium: 133 mmol/L — ABNORMAL LOW (ref 136–144)
TOTAL PROTEIN: 6.3 g/dL (ref 6.0–8.5)

## 2015-08-26 MED ORDER — FLUTICASONE PROPIONATE 50 MCG/ACT NA SUSP: 2.0000 | Freq: Every day | NASAL | Status: DC

## 2015-09-06 ENCOUNTER — Ambulatory Visit: Payer: Medicare Other | Admitting: Pharmacist

## 2015-09-14 LAB — HM DIABETES EYE EXAM

## 2015-09-15 ENCOUNTER — Encounter: Payer: Self-pay | Admitting: *Deleted

## 2015-09-16 ENCOUNTER — Ambulatory Visit: Payer: Medicare Other | Admitting: Pharmacist

## 2015-09-21 ENCOUNTER — Ambulatory Visit (INDEPENDENT_AMBULATORY_CARE_PROVIDER_SITE_OTHER): Payer: Medicare Other | Admitting: Family Medicine

## 2015-09-21 ENCOUNTER — Encounter: Payer: Self-pay | Admitting: Family Medicine

## 2015-09-21 VITALS — BP 142/91 | HR 96 | Temp 98.3°F | Ht 73.0 in | Wt 195.2 lb

## 2015-09-21 DIAGNOSIS — E113399 Type 2 diabetes mellitus with moderate nonproliferative diabetic retinopathy without macular edema, unspecified eye: Secondary | ICD-10-CM | POA: Diagnosis not present

## 2015-09-21 DIAGNOSIS — I2510 Atherosclerotic heart disease of native coronary artery without angina pectoris: Secondary | ICD-10-CM | POA: Diagnosis not present

## 2015-09-21 DIAGNOSIS — E11319 Type 2 diabetes mellitus with unspecified diabetic retinopathy without macular edema: Secondary | ICD-10-CM | POA: Insufficient documentation

## 2015-09-21 DIAGNOSIS — R0989 Other specified symptoms and signs involving the circulatory and respiratory systems: Secondary | ICD-10-CM | POA: Diagnosis not present

## 2015-09-21 NOTE — Progress Notes (Signed)
   HPI  Patient presents today in follow-up for diabetic retinopathy.  The evaluating physician for retinopathy was concerned about other possible sources of embolic disease  She requests CBC, carotid duplex,and heart evaluation for valvular disease  The patient denies any recent change in his vision. He denies chest pain. He does have occasional orthopnea requiring him to sleep on 2 pillows He also has exertional dyspnea which is worse at times. Currently no exertional dyspnea  Has a history of heart attack with a stent placement  PMH: Smoking status noted ROS: Per HPI  Objective: BP 142/91 mmHg  Pulse 96  Temp(Src) 98.3 F (36.8 C) (Oral)  Ht 6\' 1"  (1.854 m)  Wt 195 lb 3.2 oz (88.542 kg)  BMI 25.76 kg/m2 Gen: NAD, alert, cooperative with exam HEENT: NCAT, conjunctivae injected bilaterally Neck: Right-sided carotid bruit CV: RRR, good S1/S2, no murmur Resp: CTABL, no wheezes, non-labored Ext: No edema, warm Neuro: Alert and oriented, No gross deficits  EKG: changed from previous, however he does have a left bundle branch blockand stable STdepression in leads 2 and V6  Assessment and plan:  # diabetic retinopathy Emphasized good diabetic control Agree with extra workup considering that he has CAD, orthopnea, exertional dyspnea, and carotid bruit  # Carotid bruit Carotid duplex ordered  # CAD status post stent Emphasized continued aspirin Statin EKG Also ordered echocardiogram considering orthopnea    Laroy Apple, MD [facility] 09/21/2015, 3:11 PM

## 2015-09-21 NOTE — Patient Instructions (Addendum)
Great to see you!  We will work on the ultrasounds I talked about.   Lets plan to see you in 2 months but I would like you to see Tammy within the next 2 weeks to discuss your diabetes.   Please make pharmacy appointment  PLease be sure to take all of your diabetic medicines as prescribed.

## 2015-09-22 ENCOUNTER — Ambulatory Visit (INDEPENDENT_AMBULATORY_CARE_PROVIDER_SITE_OTHER): Payer: Medicare Other | Admitting: Pharmacist

## 2015-09-22 ENCOUNTER — Encounter: Payer: Self-pay | Admitting: Pharmacist

## 2015-09-22 VITALS — BP 132/76 | HR 68 | Ht 73.0 in | Wt 196.8 lb

## 2015-09-22 DIAGNOSIS — E785 Hyperlipidemia, unspecified: Secondary | ICD-10-CM

## 2015-09-22 DIAGNOSIS — E781 Pure hyperglyceridemia: Secondary | ICD-10-CM | POA: Diagnosis not present

## 2015-09-22 DIAGNOSIS — R059 Cough, unspecified: Secondary | ICD-10-CM

## 2015-09-22 DIAGNOSIS — R05 Cough: Secondary | ICD-10-CM

## 2015-09-22 DIAGNOSIS — E0842 Diabetes mellitus due to underlying condition with diabetic polyneuropathy: Secondary | ICD-10-CM | POA: Diagnosis not present

## 2015-09-22 MED ORDER — ATORVASTATIN CALCIUM 80 MG PO TABS
80.0000 mg | ORAL_TABLET | Freq: Every day | ORAL | Status: DC
Start: 2015-09-22 — End: 2016-02-08

## 2015-09-22 MED ORDER — ALBUTEROL SULFATE HFA 108 (90 BASE) MCG/ACT IN AERS: 2.0000 | INHALATION_SPRAY | Freq: Four times a day (QID) | RESPIRATORY_TRACT | Status: DC | PRN

## 2015-09-22 NOTE — Patient Instructions (Addendum)
Lantus (gray / long acting insulin)  Starting tomorrow increase Lantus to 27 units in the morning.  Check blood glucose every morning.  If you blood glucose is over 120 then increase Lanuts by 1 unit daily.  Continue to check and increase lantus dose by 1 unit a day until morning blood glucose is less than 120 or if you have low blood glucose (hypoglycemia = less than 70)    Continue to use Novolog (orange) as needed for elevated blood glucose per sliding scale  Check blood glucose prior to each meal. Inject Novolog per siding scale below   70 or below = too low (eat something to increase blood glucose / sugar)   71 to 150 = no insulin   151 to 200 = 2 units   201 to 250 = 3 units   251 to 300 = 5 units   301 or higher = 7 units    RESTART ATORVASTATIN 80MG - TAKE 1 TABLET DAILY FOR CHOLESTEROL.  Diabetes and Standards of Medical Care   Diabetes is complicated. You may find that your diabetes team includes a dietitian, nurse, diabetes educator, eye doctor, and more. To help everyone know what is going on and to help you get the care you deserve, the following schedule of care was developed to help keep you on track. Below are the tests, exams, vaccines, medicines, education, and plans you will need.  Blood Glucose Goals Prior to meals = 80 - 130 Within 2 hours of the start of a meal = less than 180  HbA1c test (goal is less than 7.0% - your last value was over 14%) This test shows how well you have controlled your glucose over the past 2 to 3 months. It is used to see if your diabetes management plan needs to be adjusted.   It is performed at least 2 times a year if you are meeting treatment goals.  It is performed 4 times a year if therapy has changed or if you are not meeting treatment goals.  Blood pressure test  This test is performed at every routine medical visit. The goal is less than 140/90 mmHg for most people, but 130/80 mmHg in some cases. Ask your health care provider  about your goal.  Dental exam  Follow up with the dentist regularly.  Eye exam  If you are diagnosed with type 1 diabetes as a child, get an exam upon reaching the age of 56 years or older and have had diabetes for 3 to 5 years. Yearly eye exams are recommended after that initial eye exam.  If you are diagnosed with type 1 diabetes as an adult, get an exam within 5 years of diagnosis and then yearly.  If you are diagnosed with type 2 diabetes, get an exam as soon as possible after the diagnosis and then yearly.  Foot care exam  Visual foot exams are performed at every routine medical visit. The exams check for cuts, injuries, or other problems with the feet.  A comprehensive foot exam should be done yearly. This includes visual inspection as well as assessing foot pulses and testing for loss of sensation.  Check your feet nightly for cuts, injuries, or other problems with your feet. Tell your health care provider if anything is not healing.  Kidney function test (urine microalbumin)  This test is performed once a year.  Type 1 diabetes: The first test is performed 5 years after diagnosis.  Type 2 diabetes: The first test  is performed at the time of diagnosis.  A serum creatinine and estimated glomerular filtration rate (eGFR) test is done once a year to assess the level of chronic kidney disease (CKD), if present.  Lipid profile (cholesterol, HDL, LDL, triglycerides)  Performed every 5 years for most people.  The goal for LDL is less than 100 mg/dL. If you are at high risk, the goal is less than 70 mg/dL.  The goal for HDL is 40 mg/dL to 50 mg/dL for men and 50 mg/dL to 60 mg/dL for women. An HDL cholesterol of 60 mg/dL or higher gives some protection against heart disease.  The goal for triglycerides is less than 150 mg/dL.  Influenza vaccine, pneumococcal vaccine, and hepatitis B vaccine  The influenza vaccine is recommended yearly.  The pneumococcal vaccine is  generally given once in a lifetime. However, there are some instances when another vaccination is recommended. Check with your health care provider.  The hepatitis B vaccine is also recommended for adults with diabetes.  Diabetes self-management education  Education is recommended at diagnosis and ongoing as needed.  Treatment plan  Your treatment plan is reviewed at every medical visit.  Document Released: 07/23/2009 Document Revised: 05/28/2013 Document Reviewed: 02/25/2013 [facility] Patient Information 2014 [place].

## 2015-09-22 NOTE — Progress Notes (Signed)
Diabetes Follow-Up Visit Chief Complaint:   Chief Complaint  Patient presents with  . Diabetes     Filed Vitals:   09/22/15 1136  BP: 132/76  Pulse: 68   Filed Weights   09/22/15 1136  Weight: 196 lb 12 oz (89.245 kg)    HPI:  I last saw patient August of 2015.  Over 1 year ago.  He is here today for uncontrolled type 2 DM - long term insulin therapy   Current Diabetes Medications:  Lantus 26 units daily, Jentudueto 2.5/1000mg  1 tablet bid and Novolog per sliding scale  70 or below = too low (eat something to increase blood glucose / sugar)  71 to 150 = no insulin  151 to 200 = 2 units  201 to 250 = 3 units  251 to 300 = 5 units  301 or higher = 7 units  Call office for insulin adjustment if you have 2 or more blood glucose / sugar reading over 250 or less than 70 with in a week. Dequincy Born [id number]    Home BG Monitoring:  Not checking regularly.  In last 30 days has only checked once.  BG was 228. Low fat/carbohydrate diet?  Yes Nicotine Abuse?  Yes - still smoking about 1/2 ppd - he is trying to decrease more but has been unsuccessful Medication Compliance?  No - he states he has not been taking atorvastatin Exercise?  Np  Exam Edema:  negative  Polyuria:  negative  Polydipsia:  negative Polyphagia:  negative  BMI:  Body mass index is 25.96 kg/(m^2).   Weight changes:  increasing General Appearance:  alert, oriented, no acute distress and well nourished Mood/Affect:  normal   Lab Results  Component Value Date   HGBA1C >14.0 08/23/2015    No results found for: [facility]  Lab Results  Component Value Date   CHOL 294* 08/23/2015   HDL 50 08/23/2015   LDLCALC 178* 08/23/2015   TRIG 332* 08/23/2015   CHOLHDL 5.9* 08/23/2015    RBG was 117 in office today (this was after taking supplemental Novolog dose this am of 5 units  Assessment: 1.  Diabetes - not adequately controlled 2.  Blood Pressure.  At goal 3.  Lipids.  Elevated Tg and LDL - patient has only  been taking fenofibrate and not atorvastatin 4. Smoking cessation / Tobacco abuse - patient is trying to cut back   Recommendations: 1.  Medication recommendations at this time are as follows:    Restart atorvastatin 80mg  daily  Increase lantus by 1 units daily until fasting blood glucose reading are less than 120.    Continue Novolog Insulin (orange box)   Check blood glucose prior to each meal. Inject Novolog per siding scale below   70 or below = too low (eat something to increase blood glucose / sugar)   71 to 150 = no insulin   151 to 200 = 2 units   201 to 250 = 3 units   251 to 300 = 5 units   301 or higher = 7 units   Call office for insulin adjustment if you have 2 or more blood glucose / sugar reading over 250 or less than 70 with in a week. Jeanice Dempsey [id number]   2.  Reviewed HBG goals:  Fasting 80-130 and 1-2 hour post prandial <180.  Patient is instructed to check BG 2 times per day.    3.  BP goal < 140/85. 4.  LDL goal of <  100, HDL > 40 and TG < 150 5.  Physical Activity recommendations:  Restart walking daily 6.  Discussed smoking cessation - patient declines pharmacotherapy at this time 7.  Return to clinic in 2 weeks for AWV and recheck DM.  Time spent counseling patient:  60 minutes  Referring provider: Brock Bad, PharmD, CPP, CDE

## 2015-09-24 ENCOUNTER — Ambulatory Visit (INDEPENDENT_AMBULATORY_CARE_PROVIDER_SITE_OTHER): Payer: Medicare Other | Admitting: Cardiovascular Disease

## 2015-09-24 ENCOUNTER — Encounter: Payer: Self-pay | Admitting: Cardiovascular Disease

## 2015-09-24 VITALS — BP 130/80 | HR 96 | Ht 74.0 in | Wt 199.0 lb

## 2015-09-24 DIAGNOSIS — I10 Essential (primary) hypertension: Secondary | ICD-10-CM | POA: Diagnosis not present

## 2015-09-24 DIAGNOSIS — E782 Mixed hyperlipidemia: Secondary | ICD-10-CM

## 2015-09-24 DIAGNOSIS — I2510 Atherosclerotic heart disease of native coronary artery without angina pectoris: Secondary | ICD-10-CM

## 2015-09-24 DIAGNOSIS — Z716 Tobacco abuse counseling: Secondary | ICD-10-CM | POA: Diagnosis not present

## 2015-09-24 NOTE — Patient Instructions (Signed)
Your physician wants you to follow-up in: 1 year with Dr Koneswaran You will receive a reminder letter in the mail two months in advance. If you don't receive a letter, please call our office to schedule the follow-up appointment.    Your physician recommends that you continue on your current medications as directed. Please refer to the Current Medication list given to you today.     Thank you for choosing [facility] !        

## 2015-09-24 NOTE — Progress Notes (Signed)
Patient ID: Dustin Cruz, male   DOB: 10/05/1966, 49 y.o.   MRN: [id number]      SUBJECTIVE: The patient is a 49 yr old male with known coronary artery disease. He was found to have critical stenosis of a small ramus in 2011 and underwent bare metal stent placement. Repeat catheterization in April 2013 showed in-stent stenosis of 70-80%. Medical therapy was advised that due to the small size of the vessel. He also has HTN, diabetes mellitus, GERD s/p esophageal dilatation, and hyperlipidemia.  He denies chest pain, shortness of breath, palpitations, leg swelling, and dizziness.   He continues to smoke a half [place] to one Piacentini of cigarettes daily, but says he wants to quit.  ECG on 09/21/15 showed sinus rhythm with a left bundle-branch block.   Review of Systems: As per "subjective", otherwise negative.  No Known Allergies  Current Outpatient Prescriptions  Medication Sig Dispense Refill  . albuterol (PROVENTIL HFA;VENTOLIN HFA) 108 (90 BASE) MCG/ACT inhaler Inhale 2 puffs into the lungs every 6 (six) hours as needed for wheezing. 1 Inhaler 1  . albuterol (PROVENTIL) (2.5 MG/3ML) 0.083% nebulizer solution USE ONE  IN NEBULIZER TWICE DAILY AS NEEDED FOR WHEEZING OR SHORTNESS OF BREATH 75 mL 3  . aspirin EC 81 MG tablet Take 81 mg by mouth daily.    [name] atorvastatin (LIPITOR) 80 MG tablet Take 1 tablet (80 mg total) by mouth daily. 90 tablet 0  . fenofibrate micronized (ANTARA) 130 MG capsule Take 1 capsule (130 mg total) by mouth daily before breakfast. 30 capsule 3  . fluticasone (FLONASE) 50 MCG/ACT nasal spray Place 2 sprays into both nostrils daily. 16 g 6  . gabapentin (NEURONTIN) 800 MG tablet Take 1 tablet (800 mg total) by mouth 3 (three) times daily. 90 tablet 11  . insulin aspart (NOVOLOG FLEXPEN) 100 UNIT/ML FlexPen Inject up to 10 units prior to meal per sliding scale. 15 mL 2  . Insulin Glargine (LANTUS) 100 UNIT/ML Solostar Pen Inject 62 Units into the skin at bedtime. (Patient  taking differently: Inject 26 Units into the skin at bedtime. ) 30 mL 2  . Linagliptin-Metformin HCl (JENTADUETO) 2.02-999 MG TABS Take 1 tablet by mouth 2 (two) times daily with a meal. 60 tablet 1  . nitroGLYCERIN (NITROSTAT) 0.4 MG SL tablet Place 1 tablet (0.4 mg total) under the tongue every 5 (five) minutes as needed. For chest pain. 25 tablet 3  . omeprazole (PRILOSEC) 40 MG capsule Take 1 capsule (40 mg total) by mouth 2 (two) times daily. 180 capsule 3  . sertraline (ZOLOFT) 50 MG tablet Take 50 mg by mouth daily.     . traZODone (DESYREL) 150 MG tablet Take 1 tablet (150 mg total) by mouth at bedtime. 30 tablet 5  . [DISCONTINUED] metoprolol tartrate (LOPRESSOR) 25 MG tablet Take 25 mg by mouth 2 (two) times daily.       No current facility-administered medications for this visit.    Past Medical History  Diagnosis Date  . Diabetes mellitus     Type 2  . Hyperlipidemia   . Depression   . Coronary artery disease     s/p BMS to Ramus 10/2010;  Cath 01/22/12 prox 30-40% LAD, LCx ramus w/ hazy 70-80% in-stent restenosis, EF 60% treated medically  . HTN (hypertension)   . Tobacco abuse   . Obesity   . Anxiety   . Myocardial infarction ([place]) 2012  . GERD (gastroesophageal reflux disease)   . Hyperlipidemia   .  Asthma   . Coronary artery disease     s/p BMS to Ramus 10/2010;  Cath 01/22/12 prox 30-40% LAD, LCx ramus w/ hazy 70-80% in-stent restenosis, EF 60% treated medically   . Major depression, chronic ([place]) 08/13/2012  . Gastroparesis 05/2013  . Tubular adenoma   . COPD (chronic obstructive pulmonary disease) ([place])   . Melanoma [facility]) 2007    surgery at [facility], Follow[facility] by [facility]    Past Surgical History  Procedure Laterality Date  . Melanoma surgery  2007    [place], removed lymph nodes under arm as well,Left abd  . Cardiac catheterization      with stent  . Esophagogastroduodenoscopy  04/24/2012    Rourk-mild erosive reflux esophagitis,dilated w/55F Venia Minks, small HH,  minimal chronic gastric/bulbar erosions(No H pylori)  . Colonoscopy N/A 01/27/2013    [id number] and colonic polyps. Tubular adenomas  . Coronary angioplasty with stent placement    . Left heart catheterization with coronary angiogram N/A 01/22/2012    Procedure: LEFT HEART CATHETERIZATION WITH CORONARY ANGIOGRAM;  Surgeon: Jolaine Artist, MD;  Location: [facility] CATH LAB;  Service: Cardiovascular;  Laterality: N/A;    Social History   Social History  . Marital Status: Married    Spouse Name: rayann Starzyk  . Number of Children: 0  . Years of Education: N/A   Occupational History  . disabled    Social History Main Topics  . Smoking status: Light Tobacco Smoker -- 0.50 packs/day for 30 years    Types: Cigarettes    Start date: 10/09/1981  . Smokeless tobacco: Never Used     Comment: trying to quit, vapor   . 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  . Drug Use: No  . Sexual Activity: Yes   Other Topics Concern  . Not on file   Social History Narrative   Lives w/ wife     Danley Danker Vitals:   09/24/15 0954  BP: 130/80  Pulse: 96  Height: 6\' 2"  (1.88 m)  Weight: 199 lb (90.266 kg)  SpO2: 97%    PHYSICAL EXAM General: NAD HEENT: Conjunctival injection. Neck: No JVD, no thyromegaly. Lungs: Scattered wheezes, no rales. CV: Nondisplaced PMI.  Regular rate and rhythm, normal S1/S2, no S3/S4, no murmur. No pretibial or periankle edema.  No carotid bruit.   Abdomen: Soft, nontender, no hepatosplenomegaly, no distention.  Neurologic: Alert and oriented x 3.  Psych: Normal affect. Skin: Normal. Musculoskeletal: Normal range of motion, no gross deformities. Extremities: No clubbing or cyanosis.   ECG: Most recent ECG reviewed.      ASSESSMENT AND PLAN: Cath (April 2013): Left main: Normal  LAD: Prox 30-40% Mild diffuse plaquing  throughout. Small bridging section in midsection. Large branching diagonal that came off ostium of LAD. Mild plaque in ostium.  LCX: Gave off small to moderate sized ramus and two small OMs. In ostium of ramus there was a BMS with 70-80% hazy in-stent restenosis. Otherwise mild plaquing in the LCX  RCA: Dominant vessel with PDA and PL.Two 30% lesions in midsection. Mild plaque in PDA.  LV-gram done in the RAO projection: Ejection fraction = 60% No regional wall motion abnormalities.    ASSESSMENT AND PLAN: 1. CAD: Symptomatically stable. Continue ASA and high dose Lipitor. No longer on lisinopril nor metoprolol.  2. Essential HTN: Controlled on present  therapy. No changes required.  3. Hyperlipidemia: On Lipitor 80 mg daily. Lipids on 08/23/15 showed HDL 50, LDL 178, TG 332, TC 294. Question regarding statin compliance. Plans to recheck in several months.  4. Tobacco abuse: Cessation counseling given (1 minute). Doubt willingness to quit.  Dispo: f/u 1 year.   [name], M.D., F.A.C.C.

## 2015-10-06 ENCOUNTER — Ambulatory Visit (HOSPITAL_COMMUNITY): Admission: RE | Admit: 2015-10-06 | Payer: Medicare Other | Source: Ambulatory Visit

## 2015-10-07 ENCOUNTER — Ambulatory Visit: Payer: Self-pay | Admitting: Pharmacist

## 2015-10-13 ENCOUNTER — Ambulatory Visit (HOSPITAL_COMMUNITY): Payer: Medicare Other

## 2015-10-20 ENCOUNTER — Ambulatory Visit (HOSPITAL_COMMUNITY): Payer: Medicare Other

## 2015-10-21 ENCOUNTER — Ambulatory Visit (HOSPITAL_COMMUNITY)
Admission: RE | Admit: 2015-10-21 | Discharge: 2015-10-21 | Disposition: A | Discharge status: Home / Self Care | Payer: Medicare Other | Source: Ambulatory Visit | Attending: Family Medicine | Admitting: Family Medicine

## 2015-10-21 DIAGNOSIS — R0989 Other specified symptoms and signs involving the circulatory and respiratory systems: Secondary | ICD-10-CM | POA: Insufficient documentation

## 2015-10-21 DIAGNOSIS — I6523 Occlusion and stenosis of bilateral carotid arteries: Secondary | ICD-10-CM | POA: Diagnosis not present

## 2015-11-11 ENCOUNTER — Other Ambulatory Visit: Payer: Self-pay | Admitting: Family Medicine

## 2015-11-23 ENCOUNTER — Ambulatory Visit: Payer: Medicare Other | Admitting: Family Medicine

## 2015-11-24 ENCOUNTER — Encounter: Payer: Self-pay | Admitting: Family Medicine

## 2016-01-08 DIAGNOSIS — Z7984 Long term (current) use of oral hypoglycemic drugs: Secondary | ICD-10-CM | POA: Diagnosis not present

## 2016-01-08 DIAGNOSIS — W2209XA Striking against other stationary object, initial encounter: Secondary | ICD-10-CM | POA: Diagnosis not present

## 2016-01-08 DIAGNOSIS — Z79899 Other long term (current) drug therapy: Secondary | ICD-10-CM | POA: Diagnosis not present

## 2016-01-08 DIAGNOSIS — I252 Old myocardial infarction: Secondary | ICD-10-CM | POA: Diagnosis not present

## 2016-01-08 DIAGNOSIS — I519 Heart disease, unspecified: Secondary | ICD-10-CM | POA: Diagnosis not present

## 2016-01-08 DIAGNOSIS — Z794 Long term (current) use of insulin: Secondary | ICD-10-CM | POA: Diagnosis not present

## 2016-01-08 DIAGNOSIS — E119 Type 2 diabetes mellitus without complications: Secondary | ICD-10-CM | POA: Diagnosis not present

## 2016-01-08 DIAGNOSIS — E785 Hyperlipidemia, unspecified: Secondary | ICD-10-CM | POA: Diagnosis not present

## 2016-01-08 DIAGNOSIS — F17200 Nicotine dependence, unspecified, uncomplicated: Secondary | ICD-10-CM | POA: Diagnosis not present

## 2016-01-08 DIAGNOSIS — S6991XA Unspecified injury of right wrist, hand and finger(s), initial encounter: Secondary | ICD-10-CM | POA: Diagnosis not present

## 2016-01-08 DIAGNOSIS — S63501A Unspecified sprain of right wrist, initial encounter: Secondary | ICD-10-CM | POA: Diagnosis not present

## 2016-01-08 DIAGNOSIS — Z955 Presence of coronary angioplasty implant and graft: Secondary | ICD-10-CM | POA: Diagnosis not present

## 2016-01-08 DIAGNOSIS — I10 Essential (primary) hypertension: Secondary | ICD-10-CM | POA: Diagnosis not present

## 2016-01-08 DIAGNOSIS — M25531 Pain in right wrist: Secondary | ICD-10-CM | POA: Diagnosis not present

## 2016-01-11 DIAGNOSIS — F332 Major depressive disorder, recurrent severe without psychotic features: Secondary | ICD-10-CM | POA: Diagnosis not present

## 2016-01-13 ENCOUNTER — Emergency Department (HOSPITAL_COMMUNITY)
Admission: EM | Admit: 2016-01-13 | Discharge: 2016-01-13 | Disposition: A | Discharge status: Home / Self Care | Payer: Medicare Other | Attending: Emergency Medicine | Admitting: Emergency Medicine

## 2016-01-13 ENCOUNTER — Ambulatory Visit: Payer: Medicare Other | Admitting: Nurse Practitioner

## 2016-01-13 ENCOUNTER — Emergency Department (HOSPITAL_COMMUNITY): Payer: Medicare Other

## 2016-01-13 ENCOUNTER — Encounter (HOSPITAL_COMMUNITY): Payer: Self-pay | Admitting: *Deleted

## 2016-01-13 DIAGNOSIS — Z7982 Long term (current) use of aspirin: Secondary | ICD-10-CM | POA: Diagnosis not present

## 2016-01-13 DIAGNOSIS — Z79899 Other long term (current) drug therapy: Secondary | ICD-10-CM | POA: Insufficient documentation

## 2016-01-13 DIAGNOSIS — G44209 Tension-type headache, unspecified, not intractable: Secondary | ICD-10-CM

## 2016-01-13 DIAGNOSIS — E669 Obesity, unspecified: Secondary | ICD-10-CM | POA: Diagnosis not present

## 2016-01-13 DIAGNOSIS — R0602 Shortness of breath: Secondary | ICD-10-CM | POA: Diagnosis not present

## 2016-01-13 DIAGNOSIS — J45909 Unspecified asthma, uncomplicated: Secondary | ICD-10-CM | POA: Diagnosis not present

## 2016-01-13 DIAGNOSIS — R0981 Nasal congestion: Secondary | ICD-10-CM | POA: Diagnosis not present

## 2016-01-13 DIAGNOSIS — J3489 Other specified disorders of nose and nasal sinuses: Secondary | ICD-10-CM | POA: Diagnosis not present

## 2016-01-13 DIAGNOSIS — I2510 Atherosclerotic heart disease of native coronary artery without angina pectoris: Secondary | ICD-10-CM | POA: Diagnosis not present

## 2016-01-13 DIAGNOSIS — R197 Diarrhea, unspecified: Secondary | ICD-10-CM | POA: Diagnosis not present

## 2016-01-13 DIAGNOSIS — J449 Chronic obstructive pulmonary disease, unspecified: Secondary | ICD-10-CM | POA: Diagnosis not present

## 2016-01-13 DIAGNOSIS — R05 Cough: Secondary | ICD-10-CM | POA: Insufficient documentation

## 2016-01-13 DIAGNOSIS — M25531 Pain in right wrist: Secondary | ICD-10-CM | POA: Diagnosis not present

## 2016-01-13 DIAGNOSIS — E785 Hyperlipidemia, unspecified: Secondary | ICD-10-CM | POA: Insufficient documentation

## 2016-01-13 DIAGNOSIS — I10 Essential (primary) hypertension: Secondary | ICD-10-CM | POA: Diagnosis not present

## 2016-01-13 DIAGNOSIS — I252 Old myocardial infarction: Secondary | ICD-10-CM | POA: Insufficient documentation

## 2016-01-13 DIAGNOSIS — R51 Headache: Secondary | ICD-10-CM | POA: Diagnosis present

## 2016-01-13 DIAGNOSIS — E119 Type 2 diabetes mellitus without complications: Secondary | ICD-10-CM | POA: Insufficient documentation

## 2016-01-13 DIAGNOSIS — F17210 Nicotine dependence, cigarettes, uncomplicated: Secondary | ICD-10-CM | POA: Insufficient documentation

## 2016-01-13 DIAGNOSIS — Z794 Long term (current) use of insulin: Secondary | ICD-10-CM | POA: Insufficient documentation

## 2016-01-13 LAB — CBC WITH DIFFERENTIAL/PLATELET
Basophils Absolute: 0 10*3/uL (ref 0.0–0.1)
Basophils Relative: 1 %
EOS ABS: 0.1 10*3/uL (ref 0.0–0.7)
EOS PCT: 1 %
HCT: 45.5 % (ref 39.0–52.0)
Hemoglobin: 15.9 g/dL (ref 13.0–17.0)
LYMPHS ABS: 3 10*3/uL (ref 0.7–4.0)
Lymphocytes Relative: 40 %
MCH: 32.4 pg (ref 26.0–34.0)
MCHC: 34.9 g/dL (ref 30.0–36.0)
MCV: 92.9 fL (ref 78.0–100.0)
MONOS PCT: 7 %
Monocytes Absolute: 0.5 10*3/uL (ref 0.1–1.0)
Neutro Abs: 3.9 10*3/uL (ref 1.7–7.7)
Neutrophils Relative %: 51 %
PLATELETS: 299 10*3/uL (ref 150–400)
RBC: 4.9 MIL/uL (ref 4.22–5.81)
RDW: 12.8 % (ref 11.5–15.5)
WBC: 7.4 10*3/uL (ref 4.0–10.5)

## 2016-01-13 LAB — BASIC METABOLIC PANEL
Anion gap: 8 (ref 5–15)
BUN: 12 mg/dL (ref 6–20)
CO2: 27 mmol/L (ref 22–32)
CREATININE: 0.98 mg/dL (ref 0.61–1.24)
Calcium: 9.1 mg/dL (ref 8.9–10.3)
Chloride: 100 mmol/L — ABNORMAL LOW (ref 101–111)
GFR calc Af Amer: >60 mL/min (ref >60)
Glucose, Bld: 414 mg/dL — ABNORMAL HIGH (ref 65–99)
Potassium: 4.1 mmol/L (ref 3.5–5.1)
SODIUM: 135 mmol/L (ref 135–145)

## 2016-01-13 MED ORDER — SODIUM CHLORIDE 0.9 % IV BOLUS (SEPSIS)
1000.0000 mL | Freq: Once | INTRAVENOUS | Status: AC
  Administered 2016-01-13: 1000 mL via INTRAVENOUS

## 2016-01-13 MED ORDER — ACETAMINOPHEN 500 MG PO TABS
1000.0000 mg | ORAL_TABLET | Freq: Once | ORAL | Status: AC
  Administered 2016-01-13: 1000 mg via ORAL
  Filled 2016-01-13: qty 2

## 2016-01-13 MED ORDER — TETRACAINE HCL 0.5 % OP SOLN
OPHTHALMIC | Status: AC
  Filled 2016-01-13: qty 4

## 2016-01-13 MED ORDER — PROMETHAZINE HCL 12.5 MG PO TABS
25.0000 mg | ORAL_TABLET | Freq: Once | ORAL | Status: AC
  Administered 2016-01-13: 25 mg via ORAL
  Filled 2016-01-13: qty 2

## 2016-01-13 MED ORDER — IPRATROPIUM-ALBUTEROL 0.5-2.5 (3) MG/3ML IN SOLN
3.0000 mL | Freq: Once | RESPIRATORY_TRACT | Status: AC
  Administered 2016-01-13: 3 mL via RESPIRATORY_TRACT
  Filled 2016-01-13: qty 3

## 2016-01-13 MED ORDER — TETRACAINE HCL 0.5 % OP SOLN
1.0000 [drp] | Freq: Once | OPHTHALMIC | Status: AC
  Administered 2016-01-13: 1 [drp] via OPHTHALMIC
  Filled 2016-01-13: qty 4

## 2016-01-13 NOTE — ED Notes (Signed)
Pt c/o headache that started earlier today with sensitivity to light, sclera red in bilateral eyes, denies any hx of migraine headaches, pt also reports that he has been having a cough for the past 3-4 days, also c/o pain to right wrist area after lifting a couch,

## 2016-01-13 NOTE — Discharge Instructions (Signed)
Continue taking Tylenol as needed for pain control. Drink plenty of fluids and get plenty of rest. We did not find serious cause of your headache today. Return without fail for worsening symptoms, including worsening pain, confusion, vomiting unable to keep down food or fluids, new vision or speech changes, inability to walk, new numbness or weakness, or any other symptoms concerning to you.  Tension Headache A tension headache is pain, pressure, or aching that is felt over the front and sides of your head. These headaches can last from 30 minutes to several days. HOME CARE Managing Pain  Take over-the-counter and prescription medicines only as told by your doctor.  Lie down in a dark, quiet room when you have a headache.  If directed, apply ice to your head and neck area:  Put ice in a plastic bag.  Place a towel between your skin and the bag.  Leave the ice on for 20 minutes, 2-3 times per day.  Use a heating pad or a hot shower to apply heat to your head and neck area as told by your doctor. Eating and Drinking  Eat meals on a regular schedule.  Do not drink a lot of alcohol.  Do not use a lot of caffeine, or stop using caffeine. General Instructions  Keep all follow-up visits as told by your doctor. This is important.  Keep a journal to find out if certain things bring on headaches. For example, write down:  What you eat and drink.  How much sleep you get.  Any change to your diet or medicines.  Try getting a massage, or doing other things that help you to relax.  Lessen stress.  Sit up straight. Do not tighten (tense) your muscles.  Do not use tobacco products. This includes cigarettes, chewing tobacco, or e-cigarettes. If you need help quitting, ask your doctor.  Exercise regularly as told by your doctor.  Get enough sleep. This may mean 7-9 hours of sleep. GET HELP IF:  Your symptoms are not helped by medicine.  You have a headache that feels different  from your usual headache.  You feel sick to your stomach (nauseous) or you throw up (vomit).  You have a fever. GET HELP RIGHT AWAY IF:  Your headache becomes very bad.  You keep throwing up.  You have a stiff neck.  You have trouble seeing.  You have trouble speaking.  You have pain in your eye or ear.  Your muscles are weak or you lose muscle control.  You lose your balance or you have trouble walking.  You feel like you will pass out (faint) or you pass out.  You have confusion.   This information is not intended to replace advice given to you by your health care provider. Make sure you discuss any questions you have with your health care provider.   Document Released: 12/20/2009 Document Revised: 06/16/2015 Document Reviewed: 01/18/2015 Elsevier Interactive Patient Education [employer].

## 2016-01-13 NOTE — ED Provider Notes (Signed)
CSN: [id number]     Arrival date & time 01/13/16  2006 History  By signing my name below, I, [name], attest that this documentation has been prepared under the direction and in the presence of Forde Dandy, MD . Electronically Signed: [name], Scribe. 01/13/2016. 9:51 PM.    Chief Complaint  Patient presents with  . Headache   The history is provided by the patient. No language interpreter was used.   HPI Comments:  Dustin Cruz is a 50 y.o. male with PMHx of DM, HLD, CAD, HTN, , COPD who presents to the Emergency Department complaining of 10/10 progressively worsening headache over BL temples onset 1500 today while sitting outside at rest. Pain initially mild, gradually worsening over the past few hours. Not relieved by napping, and normally his headaches are relieved after a nap. Pt reports associated mild shortness of breath, persistent cough, congestion, rhinorrhea, diarrhea for two days, eye redness, sensitivity to light and dizziness. He reports normal PO and fluid intake. Pt is a current smoker. Pt denies h/o migraines, fever, nausea, vomiting, visual disturbance, abdominal pain, dysuria, eye pain, or foreign body in eye. No numbness, weakness, or gait instability.  Past Medical History  Diagnosis Date  . Diabetes mellitus     Type 2  . Hyperlipidemia   . Depression   . Coronary artery disease     s/p BMS to Ramus 10/2010;  Cath 01/22/12 prox 30-40% LAD, LCx ramus w/ hazy 70-80% in-stent restenosis, EF 60% treated medically  . HTN (hypertension)   . Tobacco abuse   . Obesity   . Anxiety   . Myocardial infarction ([place]) 2012  . GERD (gastroesophageal reflux disease)   . Hyperlipidemia   . Asthma   . Coronary artery disease     s/p BMS to [place] 10/2010;  Cath 01/22/12 prox 30-40% LAD, LCx ramus w/ hazy 70-80% in-stent restenosis, EF 60% treated medically   . Major depression, chronic ([place]) 08/13/2012  . Gastroparesis 05/2013  . Tubular adenoma   . COPD (chronic  obstructive pulmonary disease) ([place])   . Melanoma [facility]) 2007    [facility] at [facility], Followed by [facility]   Past Surgical History  Procedure Laterality Date  . Melanoma surgery  2007    [place], removed lymph nodes under arm as well,Left abd  . Cardiac catheterization      with stent  . Esophagogastroduodenoscopy  04/24/2012    [place]-mild erosive reflux esophagitis,dilated w/56F Venia Minks, small HH, minimal chronic gastric/bulbar erosions(No H pylori)  . Colonoscopy N/A 01/27/2013    [id number] and colonic polyps. Tubular adenomas  . Coronary angioplasty with stent placement    . Left heart catheterization with coronary angiogram N/A 01/22/2012    Procedure: LEFT HEART CATHETERIZATION WITH CORONARY ANGIOGRAM;  Surgeon: Jolaine Artist, MD;  Location: [facility] CATH LAB;  Service: Cardiovascular;  Laterality: N/A;   Family History  Problem Relation Age of Onset  . Lung cancer    . Heart disease    . Other      not real familiar with family history  . Colon cancer Neg Hx   . Colon polyps Neg Hx   . Stroke Mother   . Alcohol abuse Father    Social History  Substance Use Topics  . Smoking status: Light Tobacco Smoker -- 0.50 packs/day for 30 years    Types: Cigarettes    Start date: 10/09/1981  . Smokeless tobacco: Never Used     Comment: trying to quit, vapor   .  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    Review of Systems  Constitutional: Negative for fever.  HENT: Positive for congestion and rhinorrhea.   Eyes: Positive for photophobia and redness. Negative for pain and visual disturbance.  Respiratory: Positive for cough and shortness of breath.   Gastrointestinal: Positive for diarrhea. Negative for nausea, vomiting and abdominal pain.  Musculoskeletal: Positive for arthralgias (wrist).  Neurological: Positive for dizziness and headaches.   All other systems reviewed and are negative.  Allergies  Review of patient's allergies indicates no known allergies.  Home Medications   Prior to Admission medications   Medication Sig Start Date End Date Taking? Authorizing Provider  albuterol (PROVENTIL HFA;VENTOLIN HFA) 108 (90 BASE) MCG/ACT inhaler Inhale 2 puffs into the lungs every 6 (six) hours as needed for wheezing. 09/22/15  Yes Timmothy Euler, MD  albuterol (PROVENTIL) (2.5 MG/3ML) 0.083% nebulizer solution USE ONE  IN NEBULIZER TWICE DAILY AS NEEDED FOR WHEEZING OR SHORTNESS OF BREATH 09/10/14  Yes Lysbeth Penner, FNP  aspirin EC 81 MG tablet Take 81 mg by mouth daily.   Yes Historical Provider, MD  atorvastatin (LIPITOR) 80 MG tablet Take 1 tablet (80 mg total) by mouth daily. 09/22/15  Yes Timmothy Euler, MD  fenofibrate micronized (ANTARA) 130 MG capsule TAKE ONE CAPSULE BY MOUTH ONCE DAILY BEFORE BREAKFAST 11/11/15  Yes Timmothy Euler, MD  fluticasone [facility]) 50 MCG/ACT nasal spray Place 2 sprays into both nostrils daily. 08/26/15  Yes Timmothy Euler, MD  gabapentin (NEURONTIN) 800 MG tablet TAKE ONE TABLET BY MOUTH THREE TIMES DAILY 11/11/15  Yes Timmothy Euler, MD  insulin aspart (NOVOLOG FLEXPEN) 100 UNIT/ML FlexPen Inject up to 10 units prior to meal per sliding scale. 05/13/14  Yes Tammy Eckard, PHARMD  Insulin Glargine (LANTUS) 100 UNIT/ML Solostar Pen Inject 62 Units into the skin at bedtime. Patient taking differently: Inject 26 Units into the skin at bedtime.  05/17/15  Yes Timmothy Euler, MD  JENTADUETO 2.02-999 MG TABS TAKE ONE TABLET BY MOUTH TWICE DAILY WITH A MEAL 11/11/15  Yes Timmothy Euler, MD  omeprazole (PRILOSEC) 40 MG capsule TAKE ONE CAPSULE BY MOUTH TWICE DAILY 11/11/15  Yes Timmothy Euler, MD  sertraline (ZOLOFT) 100 MG tablet Take 100 mg by mouth daily. 01/11/16  Yes Historical Provider, MD  traZODone (DESYREL) 150 MG tablet Take 1 tablet (150 mg total) by mouth at bedtime. 07/28/14  Yes  Lysbeth Penner, FNP  nitroGLYCERIN (NITROSTAT) 0.4 MG SL tablet Place 1 tablet (0.4 mg total) under the tongue every 5 (five) minutes as needed. For chest pain. 08/13/12   Reece Packer, NP   BP 143/78 mmHg  Pulse 76  Temp(Src) 98.5 F (36.9 C) (Oral)  Resp 16  Ht 6\' 2"  (1.88 m)  Wt 197 lb (89.359 kg)  BMI 25.28 kg/m2  SpO2 100% Physical Exam Physical Exam  Nursing note and vitals reviewed. Constitutional: Well developed, well nourished, non-toxic, and in no acute distress Head: Normocephalic and atraumatic.  Eyes: BL Conjunctival injection Mouth/Throat: Oropharynx is clear and moist.  Neck: Normal range of motion. Neck supple.  Cardiovascular: Normal rate and regular rhythm.   Pulmonary/Chest: Effort normal and breath sounds normal. Diffuse expiratory wheezing and bronchospastic cough. Abdominal: Soft. There is no tenderness. There is no rebound and no guarding.  Musculoskeletal: Normal range of motion.  Neurological:  Alert, oriented to person, place, time, and situation. Memory grossly in tact. Fluent speech. No dysarthria or aphasia.  Cranial nerves: Pupils are symmetric, and reactive to light. EOMI without nystagmus. No gaze deviation. Facial muscles symmetric with activation. Sensation to light touch over face in tact bilaterally. Hearing grossly in tact. Palate elevates symmetrically. Head turn and shoulder shrug are intact. Tongue midline.  Reflexes defered.  Muscle bulk and tone normal. No pronator drift. Moves all extremities symmetrically. Sensation to light touch is in tact throughout in bilateral upper and lower extremities. Coordination reveals no dysmetria with finger to nose. Non-ataxic. Skin: Skin is warm and dry.  Psychiatric: Cooperative  ED Course  Procedures  DIAGNOSTIC STUDIES:  Oxygen Saturation is 98% on RA, normal by my interpretation.    COORDINATION OF CARE:  9:22 PM Will administer breathing treatment, Tylenol and Phenergan. Will order chest  x-ray, head CT, CBC, and BMP. Discussed treatment plan with pt at bedside and pt agreed to plan.  Labs Review Labs Reviewed  BASIC METABOLIC PANEL - Abnormal; Notable for the following:    Chloride 100 (*)    Glucose, Bld 414 (*)    All other components within normal limits  CBC WITH DIFFERENTIAL/PLATELET    Imaging Review Dg Chest 2 View  01/13/2016  CLINICAL DATA:  Productive cough. EXAM: CHEST - 2 VIEW COMPARISON:  01/10/2015 FINDINGS: The heart size and mediastinal contours are within normal limits. There is no evidence of pulmonary edema, consolidation, pneumothorax, nodule or pleural fluid. The visualized skeletal structures are unremarkable. IMPRESSION: No active disease. Electronically Signed   By: Aletta Edouard M.D.   On: 01/13/2016 21:45   Ct Head Wo Contrast  01/13/2016  CLINICAL DATA:  Worsening headache, onset at 15:00. EXAM: CT HEAD WITHOUT CONTRAST TECHNIQUE: Contiguous axial images were obtained from the base of the skull through the vertex without intravenous contrast. COMPARISON:  07/02/2012 FINDINGS: There is no intracranial hemorrhage, mass or evidence of acute infarction. There is no extra-axial fluid collection. Gray matter and white matter appear normal. Cerebral volume is normal for age. Brainstem and posterior fossa are unremarkable. The CSF spaces appear normal. The bony structures are intact. The visible portions of the paranasal sinuses are clear. IMPRESSION: Normal brain Electronically Signed   By: Andreas Newport M.D.   On: 01/13/2016 22:44   I have personally reviewed and evaluated these images and lab results as part of my medical decision-making.   EKG Interpretation None      MDM   Final diagnoses:  Tension-type headache, not intractable, unspecified chronicity pattern    50 year old male who presents with headache. He is nontoxic in no acute distress, with stable vital signs on arrival. Neurologically intact. Presentation suggestive of likely  tension headache in the setting of URI symptoms. Discussed some wheezes on exam, relieved by breathing treatment. CT head negative. Chest x-ray without acute cardiopulmonary processes. Basic blood work unremarkable. Given Tylenol for pain control to some good effect. No sudden onset maximal intensity headache to suggest presence of a subarachnoid hemorrhage. No meningismus, and presentation not concerning for meningitis or other serious infectious processes. Discussed continued supportive care for home. Strict return and follow-up instructions are reviewed. He expressed understanding of all discharge instructions, and felt comfortable to plan of care.  I personally performed the services described in this documentation, which was scribed in my presence. The recorded information has been reviewed and is accurate.    Hinton Dyer Duo  Oleta Mouse, MD 01/13/16 2329

## 2016-01-13 NOTE — ED Notes (Signed)
In radiology

## 2016-01-18 ENCOUNTER — Encounter: Payer: Self-pay | Admitting: Family Medicine

## 2016-01-18 ENCOUNTER — Ambulatory Visit (INDEPENDENT_AMBULATORY_CARE_PROVIDER_SITE_OTHER): Payer: Medicare Other | Admitting: Family Medicine

## 2016-01-18 VITALS — BP 133/79 | HR 94 | Temp 98.5°F | Ht 74.0 in | Wt 192.0 lb

## 2016-01-18 DIAGNOSIS — E0842 Diabetes mellitus due to underlying condition with diabetic polyneuropathy: Secondary | ICD-10-CM

## 2016-01-18 DIAGNOSIS — M25531 Pain in right wrist: Secondary | ICD-10-CM | POA: Diagnosis not present

## 2016-01-18 DIAGNOSIS — E1140 Type 2 diabetes mellitus with diabetic neuropathy, unspecified: Secondary | ICD-10-CM | POA: Diagnosis not present

## 2016-01-18 DIAGNOSIS — Z794 Long term (current) use of insulin: Secondary | ICD-10-CM | POA: Diagnosis not present

## 2016-01-18 DIAGNOSIS — E785 Hyperlipidemia, unspecified: Secondary | ICD-10-CM

## 2016-01-18 LAB — BAYER DCA HB A1C WAIVED: HB A1C: 13.7 % — AB (ref <7.0)

## 2016-01-18 NOTE — Progress Notes (Signed)
Subjective:  Patient ID: Dustin Cruz, male    DOB: 09-10-1966  Age: 50 y.o. MRN: [id number]  CC: Hospitalization Follow-up   HPI Dustin Cruz presents for  follow-up of hypertension. Patient has no history of headache chest pain or shortness of breath or recent cough. Patient also denies symptoms of TIA such as numbness weakness lateralizing. Patient checks  blood pressure at home and has not had any elevated readings recently. Patient denies side effects from his medication. States taking it regularly.  Patient also  in for follow-up of elevated cholesterol. Doing well without complaints on current medication. Denies side effects of statin including myalgia and arthralgia and nausea. Also in today for liver function testing. Currently no chest pain, shortness of breath or other cardiovascular related symptoms noted.  Follow-up of diabetes. Patient does check blood sugar at home. Readings run between 180 and 200. "If it's 200, I feel good." Patient denies symptoms such as polyuria, polydipsia, excessive hunger, nausea No significant hypoglycemic spells noted. Medications as noted below. Taking them regularly without complication/adverse reaction being reported today. Uses novolog 15 units prn once daily. Lantus 45 units or so a day.   Gets headaches from pollen. Went to E.D. 5 days ago. Sx resolved.3 weeks ago went to [place] E.[facility]. Due to wrist pain after lifting his couch.   History Dustin Cruz has a past medical history of Diabetes mellitus; Hyperlipidemia; Depression; Coronary artery disease; HTN (hypertension); Tobacco abuse; Obesity; Anxiety; Myocardial infarction ([place]) (2012); GERD (gastroesophageal reflux disease); Hyperlipidemia; Asthma; Coronary artery disease; Major depression, chronic ([place]) (08/13/2012); Gastroparesis (05/2013); Tubular adenoma; COPD (chronic obstructive pulmonary disease) ([place]); and Melanoma ([place]) (2007).   He has past surgical history that includes melanoma surgery  (2007); Cardiac catheterization; Esophagogastroduodenoscopy (04/24/2012); Colonoscopy (N/A, 01/27/2013); Coronary angioplasty with stent; and left heart catheterization with coronary angiogram (N/A, 01/22/2012).   His family history includes Alcohol abuse in his father; Stroke in his mother. There is no history of Colon cancer or Colon polyps.He reports that he has been smoking Cigarettes.  He started smoking about 34 years ago. He has a 15 [place]-year smoking history. He has never used smokeless tobacco. He reports that he drinks alcohol. He reports that he does not use illicit drugs.  Current Outpatient Prescriptions on File Prior to Visit  Medication Sig Dispense Refill  . albuterol (PROVENTIL HFA;VENTOLIN HFA) 108 (90 BASE) MCG/ACT inhaler Inhale 2 puffs into the lungs every 6 (six) hours as needed for wheezing. 1 Inhaler 1  . albuterol (PROVENTIL) (2.5 MG/3ML) 0.083% nebulizer solution USE ONE  IN NEBULIZER TWICE DAILY AS NEEDED FOR WHEEZING OR SHORTNESS OF BREATH 75 mL 3  . aspirin EC 81 MG tablet Take 81 mg by mouth daily.    [name] atorvastatin (LIPITOR) 80 MG tablet Take 1 tablet (80 mg total) by mouth daily. 90 tablet 0  . fenofibrate micronized (ANTARA) 130 MG capsule TAKE ONE CAPSULE BY MOUTH ONCE DAILY BEFORE BREAKFAST 30 capsule 3  . fluticasone (FLONASE) 50 MCG/ACT nasal spray Place 2 sprays into both nostrils daily. 16 g 6  . gabapentin (NEURONTIN) 800 MG tablet TAKE ONE TABLET BY MOUTH THREE TIMES DAILY 90 tablet 2  . insulin aspart (NOVOLOG FLEXPEN) 100 UNIT/ML FlexPen Inject up to 10 units prior to meal per sliding scale. 15 mL 2  . Insulin Glargine (LANTUS) 100 UNIT/ML Solostar Pen Inject 62 Units into the skin at bedtime. (Patient taking differently: Inject 26 Units into the skin at bedtime. ) 30 mL 2  .  JENTADUETO 2.02-999 MG TABS TAKE ONE TABLET BY MOUTH TWICE DAILY WITH A MEAL 60 tablet 3  . nitroGLYCERIN (NITROSTAT) 0.4 MG SL tablet Place 1 tablet (0.4 mg total) under the tongue every  5 (five) minutes as needed. For chest pain. 25 tablet 3  . omeprazole (PRILOSEC) 40 MG capsule TAKE ONE CAPSULE BY MOUTH TWICE DAILY 180 capsule 1  . sertraline (ZOLOFT) 100 MG tablet Take 100 mg by mouth daily.    . traZODone (DESYREL) 150 MG tablet Take 1 tablet (150 mg total) by mouth at bedtime. 30 tablet 5  . [DISCONTINUED] metoprolol tartrate (LOPRESSOR) 25 MG tablet Take 25 mg by mouth 2 (two) times daily.       No current facility-administered medications on file prior to visit.    ROS Review of Systems  Constitutional: Negative for fever, chills, diaphoresis and unexpected weight change.  HENT: Negative for congestion, hearing loss, rhinorrhea and sore throat.   Eyes: Negative for visual disturbance.  Respiratory: Negative for cough and shortness of breath.   Cardiovascular: Negative for chest pain.  Gastrointestinal: Negative for abdominal pain, diarrhea and constipation.  Genitourinary: Negative for dysuria and flank pain.  Musculoskeletal: Positive for myalgias and arthralgias. Negative for joint swelling.  Skin: Negative for rash.  Neurological: Positive for headaches (migraine from pollen occasionally. Currently not aactive since E.D. visit). Negative for dizziness.  Psychiatric/Behavioral: Negative for sleep disturbance and dysphoric mood.    Objective:  BP 133/79 mmHg  Pulse 94  Temp(Src) 98.5 F (36.9 C) (Oral)  Ht _0  (1.88 m)  Wt 192 lb (87.091 kg)  BMI 24.64 kg/m2  SpO2 96%  BP Readings from Last 3 Encounters:  01/18/16 133/79  01/13/16 143/78  09/24/15 130/80    Wt Readings from Last 3 Encounters:  01/18/16 192 lb (87.091 kg)  01/13/16 197 lb (89.359 kg)  09/24/15 199 lb (90.266 kg)     Physical Exam  Constitutional: He appears well-developed and well-nourished.  HENT:  Head: Normocephalic and atraumatic.  Right Ear: Tympanic membrane and external ear normal. No decreased hearing is noted.  Left Ear: Tympanic membrane and external ear normal.  No decreased hearing is noted.  Mouth/Throat: No oropharyngeal exudate or posterior oropharyngeal erythema.  Eyes: Pupils are equal, round, and reactive to light.  Neck: Normal range of motion. Neck supple.  Cardiovascular: Normal rate and regular rhythm.   No murmur heard. Pulmonary/Chest: Breath sounds normal. No respiratory distress.  Abdominal: Soft. Bowel sounds are normal. He exhibits no mass. There is no tenderness.  Musculoskeletal: He exhibits tenderness (radial styloid tenderon right).  Vitals reviewed.   Lab Results  Component Value Date   HGBA1C >14.0 08/23/2015   HGBA1C >14 05/17/2015   HGBA1C >14.0 05/13/2014    Lab Results  Component Value Date   WBC 7.4 01/13/2016   HGB 15.9 01/13/2016   HCT 45.5 01/13/2016   PLT 299 01/13/2016   GLUCOSE 414* 01/13/2016   CHOL 294* 08/23/2015   TRIG 332* 08/23/2015   HDL 50 08/23/2015   LDLCALC 178* 08/23/2015   ALT 13 08/23/2015   AST 10 08/23/2015   NA 135 01/13/2016   K 4.1 01/13/2016   CL 100* 01/13/2016   CREATININE 0.98 01/13/2016   BUN 12 01/13/2016   CO2 27 01/13/2016   TSH 0.882 12/10/2013   PSA 1.1 12/10/2013   INR 0.88 07/02/2012   HGBA1C >14.0 08/23/2015    Dg Chest 2 View  01/13/2016  CLINICAL DATA:  Productive cough. EXAM: CHEST -  2 VIEW COMPARISON:  01/10/2015 FINDINGS: The heart size and mediastinal contours are within normal limits. There is no evidence of pulmonary edema, consolidation, pneumothorax, nodule or pleural fluid. The visualized skeletal structures are unremarkable. IMPRESSION: No active disease. Electronically Signed   By: Aletta Edouard M.D.   On: 01/13/2016 21:45   Ct Head Wo Contrast  01/13/2016  CLINICAL DATA:  Worsening headache, onset at 15:00. EXAM: CT HEAD WITHOUT CONTRAST TECHNIQUE: Contiguous axial images were obtained from the base of the skull through the vertex without intravenous contrast. COMPARISON:  07/02/2012 FINDINGS: There is no intracranial hemorrhage, mass or evidence  of acute infarction. There is no extra-axial fluid collection. Gray matter and white matter appear normal. Cerebral volume is normal for age. Brainstem and posterior fossa are unremarkable. The CSF spaces appear normal. The bony structures are intact. The visible portions of the paranasal sinuses are clear. IMPRESSION: Normal brain Electronically Signed   By: Andreas Newport M.D.   On: 01/13/2016 22:44    Assessment & Plan:   Rahm was seen today for hospitalization follow-up.  Diagnoses and all orders for this visit:  Diabetes mellitus due to underlying condition with diabetic polyneuropathy, unspecified long term insulin use status ([place])  Type 2 diabetes mellitus with diabetic neuropathy, with long-term current use of insulin ([facility]) -     Bayer DCA Hb A1c Waived -     CMP14+EGFR  Hyperlipidemia  Right wrist pain -     DG Wrist Complete Right; Future -     Wrist splint   I am having Mr. Leiner maintain his nitroGLYCERIN, aspirin EC, insulin aspart, traZODone, albuterol, Insulin Glargine, fluticasone, albuterol, atorvastatin, omeprazole, JENTADUETO, fenofibrate micronized, gabapentin, and sertraline.  No orders of the defined types were placed in this encounter.     Follow-up: Return in about 3 months (around 04/18/2016) for diabetes, with Wendi Snipes.  Claretta Fraise, M.D.

## 2016-01-18 NOTE — Patient Instructions (Signed)
Continue Novolog Insulin (orange box) : Check blood glucose prior to each meal.   Inject Novolog per siding scale below  70 or below = too low (eat something to increase blood glucose / sugar)  71 to 150 = no insulin  151 to 200 = 2 units  201 to 250 = 3 units  251 to 300 = 5 units  301 or higher = 7 units

## 2016-01-19 ENCOUNTER — Other Ambulatory Visit (INDEPENDENT_AMBULATORY_CARE_PROVIDER_SITE_OTHER): Payer: Medicare Other

## 2016-01-19 DIAGNOSIS — M25531 Pain in right wrist: Secondary | ICD-10-CM

## 2016-01-19 LAB — CMP14+EGFR
ALBUMIN: 3.9 g/dL (ref 3.5–5.5)
ALK PHOS: 66 IU/L (ref 39–117)
ALT: 8 IU/L (ref 0–44)
AST: 11 IU/L (ref 0–40)
Albumin/Globulin Ratio: 1.6 (ref 1.2–2.2)
BUN/Creatinine Ratio: 19 (ref 9–20)
BUN: 17 mg/dL (ref 6–24)
Bilirubin Total: 0.5 mg/dL (ref 0.0–1.2)
CALCIUM: 9.3 mg/dL (ref 8.7–10.2)
CO2: 22 mmol/L (ref 18–29)
CREATININE: 0.9 mg/dL (ref 0.76–1.27)
Chloride: 92 mmol/L — ABNORMAL LOW (ref 96–106)
GFR calc Af Amer: 115 mL/min/{1.73_m2} (ref >59)
GFR, EST NON AFRICAN AMERICAN: 99 mL/min/{1.73_m2} (ref >59)
GLUCOSE: 352 mg/dL — AB (ref 65–99)
Globulin, Total: 2.5 g/dL (ref 1.5–4.5)
Potassium: 4.6 mmol/L (ref 3.5–5.2)
Sodium: 133 mmol/L — ABNORMAL LOW (ref 134–144)
Total Protein: 6.4 g/dL (ref 6.0–8.5)

## 2016-01-20 ENCOUNTER — Encounter: Payer: Self-pay | Admitting: *Deleted

## 2016-02-02 ENCOUNTER — Telehealth: Payer: Self-pay | Admitting: Family Medicine

## 2016-02-03 NOTE — Telephone Encounter (Signed)
Patient aware forms are ready for pick up.

## 2016-02-03 NOTE — Telephone Encounter (Signed)
Please advise 

## 2016-02-03 NOTE — Telephone Encounter (Signed)
I do not know of any forms.   Laroy Apple, MD [facility] 02/03/2016, 12:48 PM

## 2016-02-08 ENCOUNTER — Encounter: Payer: Self-pay | Admitting: Family Medicine

## 2016-02-08 ENCOUNTER — Ambulatory Visit (INDEPENDENT_AMBULATORY_CARE_PROVIDER_SITE_OTHER): Payer: Medicare Other | Admitting: Family Medicine

## 2016-02-08 VITALS — BP 133/85 | HR 88 | Temp 97.0°F | Ht 74.0 in | Wt 192.4 lb

## 2016-02-08 DIAGNOSIS — F329 Major depressive disorder, single episode, unspecified: Secondary | ICD-10-CM

## 2016-02-08 DIAGNOSIS — M25531 Pain in right wrist: Secondary | ICD-10-CM | POA: Diagnosis not present

## 2016-02-08 DIAGNOSIS — Z794 Long term (current) use of insulin: Secondary | ICD-10-CM | POA: Diagnosis not present

## 2016-02-08 DIAGNOSIS — R05 Cough: Secondary | ICD-10-CM | POA: Diagnosis not present

## 2016-02-08 DIAGNOSIS — E1140 Type 2 diabetes mellitus with diabetic neuropathy, unspecified: Secondary | ICD-10-CM | POA: Diagnosis not present

## 2016-02-08 DIAGNOSIS — R059 Cough, unspecified: Secondary | ICD-10-CM

## 2016-02-08 DIAGNOSIS — E785 Hyperlipidemia, unspecified: Secondary | ICD-10-CM | POA: Diagnosis not present

## 2016-02-08 DIAGNOSIS — F32A Depression, unspecified: Secondary | ICD-10-CM

## 2016-02-08 MED ORDER — ATORVASTATIN CALCIUM 80 MG PO TABS: 80.0000 mg | ORAL_TABLET | Freq: Every day | ORAL | Status: DC

## 2016-02-08 MED ORDER — GABAPENTIN 800 MG PO TABS: 800.0000 mg | ORAL_TABLET | Freq: Three times a day (TID) | ORAL | Status: DC

## 2016-02-08 MED ORDER — ALBUTEROL SULFATE HFA 108 (90 BASE) MCG/ACT IN AERS: 2.0000 | INHALATION_SPRAY | Freq: Four times a day (QID) | RESPIRATORY_TRACT | Status: DC | PRN

## 2016-02-08 MED ORDER — DICLOFENAC SODIUM 1 % TD GEL: 2.0000 g | Freq: Four times a day (QID) | TRANSDERMAL | Status: DC

## 2016-02-08 NOTE — Progress Notes (Signed)
   HPI  Patient presents today here for physical exam and medication refills.  Needs refills on gabapentin, atorvastatin, and albuterol. He is still smoking and is not really ready to quit.  Gabapentin is helping very well with neuropathy.  Mood, still very well controlled on reduced dose of Zoloft, is now taking 50 mg daily.   Diabetes moderat eto poor compliance with insulin No hypoglycemia  Hyperlipidemia Reports good medication compliance, out of Lipitor for about 4 days.  PMH: Smoking status noted ROS: Per HPI  Objective: BP 133/85 mmHg  Pulse 88  Temp(Src) 97 F (36.1 C) (Oral)  Ht '6\' 2"'$  (1.88 m)  Wt 192 lb 6.4 oz (87.272 kg)  BMI 24.69 kg/m2 Gen: NAD, alert, cooperative with exam HEENT: NCAT, ptosis bilateral and symmetric, normal pupillary response, conjunctival injection bilateral CV: RRR, good S1/S2, no murmur Resp: CTABL, no wheezes, non-labored Abd: SNTND, BS present, no guarding or organomegaly Ext: No edema, warm Neuro: Alert and oriented, No gross deficits  Tenderness to palpation of right radial wrist, no deformity, redness, or swelling  Assessment and plan:  # Physical exam Normal exam Labs are mostly up-to-date, however lipid panel is due, also had severe hyperglycemia last check so I repeated a CMP  # Depression Doing well with decreased Zoloft, continue 50 mg, refill 50 mg with next refill  # Hyperlipidemia Very elevated triglycerides and cholesterol previously, reports good compliance Has known CAD, so if he is not controlled with maximal statin will likely recommend PSK-9 inhibitor  # Right wrist pain Voltaren gel Consistent with DeQuervain's tenosynovitis versus persistent sprain, offered Ortho [facility] referral but he declines for now    Orders Placed This Encounter  Procedures  . CMP14+EGFR  . Lipid Panel    Meds ordered this encounter  Medications  . albuterol (PROVENTIL HFA;VENTOLIN HFA) 108 (90 Base) MCG/ACT inhaler    Sig:  Inhale 2 puffs into the lungs every 6 (six) hours as needed for wheezing.    Dispense:  1 Inhaler    Refill:  2  . gabapentin (NEURONTIN) 800 MG tablet    Sig: Take 1 tablet (800 mg total) by mouth 3 (three) times daily.    Dispense:  270 tablet    Refill:  3  . atorvastatin (LIPITOR) 80 MG tablet    Sig: Take 1 tablet (80 mg total) by mouth daily.    Dispense:  90 tablet    Refill:  2  . diclofenac sodium (VOLTAREN) 1 % GEL    Sig: Apply 2 g topically 4 (four) times daily.    Dispense:  100 g    Refill:  Merna, MD [facility] 02/08/2016, 2:44 PM

## 2016-02-08 NOTE — Patient Instructions (Signed)
Great to see you!  PLease consider stopping smoking  We will call with labs within 1 week  The most important thing for your helath right now is taking insulin every day as prescribed.   Please follow up with Tammy for diabetes in 1 month

## 2016-02-09 ENCOUNTER — Telehealth: Payer: Self-pay | Admitting: Family Medicine

## 2016-02-09 NOTE — Telephone Encounter (Signed)
Labs were done 02/08/2016 are not back

## 2016-02-09 NOTE — Telephone Encounter (Signed)
Printed in drawer ready for pickup

## 2016-02-16 ENCOUNTER — Telehealth: Payer: Self-pay | Admitting: Family Medicine

## 2016-02-16 NOTE — Telephone Encounter (Signed)
Lab is looking into lab results

## 2016-02-20 ENCOUNTER — Encounter (HOSPITAL_COMMUNITY): Payer: Self-pay | Admitting: *Deleted

## 2016-02-20 ENCOUNTER — Emergency Department (HOSPITAL_COMMUNITY)
Admission: EM | Admit: 2016-02-20 | Discharge: 2016-02-20 | Disposition: A | Discharge status: Home / Self Care | Payer: Medicare Other | Attending: Emergency Medicine | Admitting: Emergency Medicine

## 2016-02-20 DIAGNOSIS — F17210 Nicotine dependence, cigarettes, uncomplicated: Secondary | ICD-10-CM | POA: Insufficient documentation

## 2016-02-20 DIAGNOSIS — I10 Essential (primary) hypertension: Secondary | ICD-10-CM | POA: Insufficient documentation

## 2016-02-20 DIAGNOSIS — Z794 Long term (current) use of insulin: Secondary | ICD-10-CM | POA: Insufficient documentation

## 2016-02-20 DIAGNOSIS — E785 Hyperlipidemia, unspecified: Secondary | ICD-10-CM | POA: Diagnosis not present

## 2016-02-20 DIAGNOSIS — R112 Nausea with vomiting, unspecified: Secondary | ICD-10-CM | POA: Diagnosis not present

## 2016-02-20 DIAGNOSIS — E119 Type 2 diabetes mellitus without complications: Secondary | ICD-10-CM | POA: Insufficient documentation

## 2016-02-20 DIAGNOSIS — E1165 Type 2 diabetes mellitus with hyperglycemia: Secondary | ICD-10-CM

## 2016-02-20 DIAGNOSIS — I252 Old myocardial infarction: Secondary | ICD-10-CM | POA: Diagnosis not present

## 2016-02-20 DIAGNOSIS — E669 Obesity, unspecified: Secondary | ICD-10-CM | POA: Diagnosis not present

## 2016-02-20 DIAGNOSIS — J449 Chronic obstructive pulmonary disease, unspecified: Secondary | ICD-10-CM | POA: Diagnosis not present

## 2016-02-20 DIAGNOSIS — R0682 Tachypnea, not elsewhere classified: Secondary | ICD-10-CM | POA: Diagnosis not present

## 2016-02-20 DIAGNOSIS — Z79899 Other long term (current) drug therapy: Secondary | ICD-10-CM | POA: Diagnosis not present

## 2016-02-20 DIAGNOSIS — I2510 Atherosclerotic heart disease of native coronary artery without angina pectoris: Secondary | ICD-10-CM | POA: Diagnosis not present

## 2016-02-20 DIAGNOSIS — R1084 Generalized abdominal pain: Secondary | ICD-10-CM

## 2016-02-20 DIAGNOSIS — Z7982 Long term (current) use of aspirin: Secondary | ICD-10-CM | POA: Diagnosis not present

## 2016-02-20 DIAGNOSIS — J45909 Unspecified asthma, uncomplicated: Secondary | ICD-10-CM | POA: Insufficient documentation

## 2016-02-20 DIAGNOSIS — F329 Major depressive disorder, single episode, unspecified: Secondary | ICD-10-CM | POA: Diagnosis not present

## 2016-02-20 DIAGNOSIS — R739 Hyperglycemia, unspecified: Secondary | ICD-10-CM | POA: Diagnosis not present

## 2016-02-20 LAB — URINALYSIS, ROUTINE W REFLEX MICROSCOPIC
Bilirubin Urine: NEGATIVE
LEUKOCYTES UA: NEGATIVE
Nitrite: NEGATIVE
PROTEIN: 30 mg/dL — AB
SPECIFIC GRAVITY, URINE: 1.025 (ref 1.005–1.030)
pH: 5.5 (ref 5.0–8.0)

## 2016-02-20 LAB — COMPREHENSIVE METABOLIC PANEL
ALT: 10 U/L — ABNORMAL LOW (ref 17–63)
AST: 11 U/L — AB (ref 15–41)
Albumin: 3.5 g/dL (ref 3.5–5.0)
Alkaline Phosphatase: 33 U/L — ABNORMAL LOW (ref 38–126)
Anion gap: 8 (ref 5–15)
BUN: 14 mg/dL (ref 6–20)
CHLORIDE: 100 mmol/L — AB (ref 101–111)
CO2: 25 mmol/L (ref 22–32)
CREATININE: 0.92 mg/dL (ref 0.61–1.24)
Calcium: 8.4 mg/dL — ABNORMAL LOW (ref 8.9–10.3)
Glucose, Bld: 254 mg/dL — ABNORMAL HIGH (ref 65–99)
POTASSIUM: 3.9 mmol/L (ref 3.5–5.1)
SODIUM: 133 mmol/L — AB (ref 135–145)
Total Bilirubin: 0.6 mg/dL (ref 0.3–1.2)
Total Protein: 6.1 g/dL — ABNORMAL LOW (ref 6.5–8.1)

## 2016-02-20 LAB — CBC
HCT: 44.2 % (ref 39.0–52.0)
HEMOGLOBIN: 14.9 g/dL (ref 13.0–17.0)
MCH: 31.8 pg (ref 26.0–34.0)
MCHC: 33.7 g/dL (ref 30.0–36.0)
MCV: 94.4 fL (ref 78.0–100.0)
PLATELETS: 331 10*3/uL (ref 150–400)
RBC: 4.68 MIL/uL (ref 4.22–5.81)
RDW: 12.9 % (ref 11.5–15.5)
WBC: 8.8 10*3/uL (ref 4.0–10.5)

## 2016-02-20 LAB — TROPONIN I

## 2016-02-20 LAB — URINE MICROSCOPIC-ADD ON

## 2016-02-20 LAB — LIPASE, BLOOD: LIPASE: 13 U/L (ref 11–51)

## 2016-02-20 MED ORDER — SODIUM CHLORIDE 0.9 % IV BOLUS (SEPSIS)
1000.0000 mL | Freq: Once | INTRAVENOUS | Status: AC
  Administered 2016-02-20: 1000 mL via INTRAVENOUS

## 2016-02-20 MED ORDER — PROCHLORPERAZINE EDISYLATE 5 MG/ML IJ SOLN
10.0000 mg | Freq: Once | INTRAMUSCULAR | Status: AC
  Administered 2016-02-20: 10 mg via INTRAVENOUS
  Filled 2016-02-20: qty 2

## 2016-02-20 MED ORDER — MORPHINE SULFATE (PF) 4 MG/ML IV SOLN
4.0000 mg | Freq: Once | INTRAVENOUS | Status: AC
  Administered 2016-02-20: 4 mg via INTRAVENOUS
  Filled 2016-02-20: qty 1

## 2016-02-20 MED ORDER — ONDANSETRON HCL 4 MG PO TABS: 4.0000 mg | ORAL_TABLET | Freq: Four times a day (QID) | ORAL | Status: DC

## 2016-02-20 NOTE — ED Notes (Signed)
Gave patient diet ginger ale as requested and approved by MD.

## 2016-02-20 NOTE — ED Notes (Signed)
Pt comes by EMS. He has been having n/v since yesterday. CBG 270. Pt states he is having abdominal pain that started after the emesis occurred.   18 L AC -- pt has been given 500 bolus saline and 4mg  of Zofran

## 2016-02-20 NOTE — Discharge Instructions (Signed)

## 2016-02-20 NOTE — ED Provider Notes (Signed)
CSN: [id number]     Arrival date & time 02/20/16  1345 History   First MD Initiated Contact with Patient 02/20/16 1346     Chief Complaint  Patient presents with  . Emesis   PT SAID THAT HE HAS BEEN HAVING N/V SINCE YESTERDAY AROUND NOON. HE WOKE UP YESTERDAY FEELING FINE, BUT THEN STARTED WITH THE PAIN AND N/V.  EMS GAVE PT IVFS AND 4 MG ZOFRAN.  PT'S NAUSEA IS BETTER, BUT IS STILL THERE.    (Consider location/radiation/quality/duration/timing/severity/associated sxs/prior Treatment) Patient is a 50 y.o. male presenting with vomiting.  Emesis Severity:  Moderate Timing:  Intermittent Progression:  Unchanged Chronicity:  New Recent urination:  Decreased Relieved by:  Nothing Associated symptoms: abdominal pain     Past Medical History  Diagnosis Date  . Diabetes mellitus     Type 2  . Hyperlipidemia   . Depression   . Coronary artery disease     s/p BMS to Ramus 10/2010;  Cath 01/22/12 prox 30-40% LAD, LCx ramus w/ hazy 70-80% in-stent restenosis, EF 60% treated medically  . HTN (hypertension)   . Tobacco abuse   . Obesity   . Anxiety   . Myocardial infarction ([place]) 2012  . GERD (gastroesophageal reflux disease)   . Hyperlipidemia   . Asthma   . Coronary artery disease     s/p BMS to Ramus 10/2010;  Cath 01/22/12 prox 30-40% LAD, LCx ramus w/ hazy 70-80% in-stent restenosis, EF 60% treated medically   . Major depression, chronic ([place]) 08/13/2012  . Gastroparesis 05/2013  . Tubular adenoma   . COPD (chronic obstructive pulmonary disease) ([place])   . Melanoma [facility]) 2007    surgery at [facility], Followed by [facility]   Past Surgical History  Procedure Laterality Date  . Melanoma surgery  2007    [place], removed lymph nodes under arm as well,Left abd  . Cardiac catheterization      with stent  . Esophagogastroduodenoscopy  04/24/2012    [place]-mild erosive reflux esophagitis,dilated w/88F Venia Minks, small HH, minimal chronic gastric/bulbar erosions(No H pylori)  . Colonoscopy N/A  01/27/2013    [id number] and colonic polyps. Tubular adenomas  . Coronary angioplasty with stent placement    . Left heart catheterization with coronary angiogram N/A 01/22/2012    Procedure: LEFT HEART CATHETERIZATION WITH CORONARY ANGIOGRAM;  Surgeon: Jolaine Artist, MD;  Location: [facility] CATH LAB;  Service: Cardiovascular;  Laterality: N/A;   Family History  Problem Relation Age of Onset  . Lung cancer    . Heart disease    . Other      not real familiar with family history  . Colon cancer Neg Hx   . Colon polyps Neg Hx   . Stroke Mother   . Alcohol abuse Father    Social History  Substance Use Topics  . Smoking status: Light Tobacco Smoker -- 0.50 packs/day for 30 years    Types: Cigarettes    Start date: 10/09/1981  . Smokeless tobacco: Never Used     Comment: trying to quit, vapor   . 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    Review of Systems  Gastrointestinal: Positive for nausea, vomiting and abdominal pain.  All other systems reviewed and are negative.     Allergies  Review of patient's allergies indicates no known allergies.  Home Medications   Prior to Admission medications   Medication Sig Start Date End Date Taking? Authorizing Provider  aspirin EC 81 MG tablet Take 81 mg by mouth daily.   Yes Historical Provider, MD  atorvastatin (LIPITOR) 80 MG tablet Take 1 tablet (80 mg total) by mouth daily. 02/08/16  Yes Timmothy Euler, MD  diclofenac sodium (VOLTAREN) 1 % GEL Apply 2 g topically 4 (four) times daily. 02/08/16  Yes Timmothy Euler, MD  fenofibrate micronized (ANTARA) 130 MG capsule TAKE ONE CAPSULE BY MOUTH ONCE DAILY BEFORE BREAKFAST 11/11/15  Yes Timmothy Euler, MD  gabapentin (NEURONTIN) 800 MG tablet Take 1 tablet (800 mg total) by mouth 3 (three) times daily. 02/08/16  Yes Timmothy Euler, MD  insulin  aspart (NOVOLOG FLEXPEN) 100 UNIT/ML FlexPen Inject up to 10 units prior to meal per sliding scale. 05/13/14  Yes Tammy Eckard, PHARMD  Insulin Glargine (LANTUS) 100 UNIT/ML Solostar Pen Inject 62 Units into the skin at bedtime. Patient taking differently: Inject 26 Units into the skin at bedtime.  05/17/15  Yes Timmothy Euler, MD  JENTADUETO 2.02-999 MG TABS TAKE ONE TABLET BY MOUTH TWICE DAILY WITH A MEAL 11/11/15  Yes Timmothy Euler, MD  omeprazole (PRILOSEC) 40 MG capsule TAKE ONE CAPSULE BY MOUTH TWICE DAILY 11/11/15  Yes Timmothy Euler, MD  sertraline (ZOLOFT) 100 MG tablet Take 50 mg by mouth daily. 01/11/16  Yes Historical Provider, MD  traZODone (DESYREL) 150 MG tablet Take 1 tablet (150 mg total) by mouth at bedtime. 07/28/14  Yes Lysbeth Penner, FNP  albuterol (PROVENTIL HFA;VENTOLIN HFA) 108 (90 Base) MCG/ACT inhaler Inhale 2 puffs into the lungs every 6 (six) hours as needed for wheezing. 02/08/16   Timmothy Euler, MD  albuterol (PROVENTIL) (2.5 MG/3ML) 0.083% nebulizer solution USE ONE  IN NEBULIZER TWICE DAILY AS NEEDED FOR WHEEZING OR SHORTNESS OF BREATH 09/10/14   Lysbeth Penner, FNP  nitroGLYCERIN (NITROSTAT) 0.4 MG SL tablet Place 1 tablet (0.4 mg total) under the tongue every 5 (five) minutes as needed. For chest pain. 08/13/12   Reece Packer, NP  ondansetron (ZOFRAN) 4 MG tablet Take 1 tablet (4 mg total) by mouth every 6 (six) hours. 02/20/16   Isla Pence, MD   BP 130/74 mmHg  Pulse 75  Temp(Src) 98.1 F (36.7 C) (Oral)  Resp 10  Ht 6\' 2"  (1.88 m)  Wt 192 lb (87.091 kg)  BMI 24.64 kg/m2  SpO2 99% Physical Exam  Constitutional: He is oriented to person, place, and time. He appears well-developed and well-nourished.  HENT:  Head: Normocephalic and atraumatic.  Right Ear: External ear normal.  Left Ear: External ear normal.  Nose: Nose normal.  Mouth/Throat: Mucous membranes are dry.  Eyes: Conjunctivae and EOM are normal. Pupils are equal, round, and reactive  to light.  Neck: Normal range of motion. Neck supple.  Cardiovascular: Normal rate, regular rhythm, normal heart sounds and intact distal pulses.   Pulmonary/Chest: Effort normal and breath sounds normal.  Abdominal: Soft. Bowel sounds are normal. There is tenderness.  Musculoskeletal: Normal range of motion.  Neurological: He is alert and oriented to person, place, and time.  Skin: Skin is warm and dry.  Psychiatric: He has a normal mood and affect. His behavior is normal. Judgment and thought content normal.  Nursing note and vitals reviewed.   ED Course  Procedures (including critical care time) Labs Review Labs Reviewed  COMPREHENSIVE METABOLIC PANEL - Abnormal; Notable for the  following:    Sodium 133 (*)    Chloride 100 (*)    Glucose, Bld 254 (*)    Calcium 8.4 (*)    Total Protein 6.1 (*)    AST 11 (*)    ALT 10 (*)    Alkaline Phosphatase 33 (*)    All other components within normal limits  URINALYSIS, ROUTINE W REFLEX MICROSCOPIC (NOT AT [facility]) - Abnormal; Notable for the following:    Glucose, UA >1000 (*)    Hgb urine dipstick TRACE (*)    Ketones, ur TRACE (*)    Protein, ur 30 (*)    All other components within normal limits  URINE MICROSCOPIC-ADD ON - Abnormal; Notable for the following:    Squamous Epithelial / LPF 0-5 (*)    Bacteria, UA FEW (*)    All other components within normal limits  LIPASE, BLOOD  CBC  TROPONIN I  CBG MONITORING, ED    Imaging Review No results found. I have personally reviewed and evaluated these images and lab results as part of my medical decision-making.   EKG Interpretation   Date/Time:  Sunday Feb 20 2016 13:47:21 EDT Ventricular Rate:  88 PR Interval:  207 QRS Duration: 141 QT Interval:  415 QTC Calculation: 502 R Axis:   84 Text Interpretation:  Sinus rhythm Paired ventricular premature complexes  Borderline prolonged PR interval Nonspecific intraventricular conduction  delay Anterior infarct, old Repol abnrm  suggests ischemia, diffuse leads  Confirmed by [facility] MD, Keirstin Musil ([id number]) on 02/20/2016 1:54:43 PM      MDM  PT IS FEELING MUCH BETTER.  HE KNOWS TO RETURN IF WORSE. Final diagnoses:  Non-intractable vomiting with nausea, vomiting of unspecified type  Generalized abdominal pain  Poorly controlled type 2 diabetes mellitus (HCC)        Isla Pence, MD 02/20/16 [phone]

## 2016-02-21 ENCOUNTER — Other Ambulatory Visit: Payer: Self-pay | Admitting: Family Medicine

## 2016-02-21 LAB — CBG MONITORING, ED: Glucose-Capillary: 264 mg/dL — ABNORMAL HIGH (ref 65–99)

## 2016-05-12 ENCOUNTER — Other Ambulatory Visit: Payer: Self-pay | Admitting: Family Medicine

## 2016-07-03 ENCOUNTER — Encounter: Payer: Self-pay | Admitting: Nurse Practitioner

## 2016-07-03 ENCOUNTER — Ambulatory Visit (INDEPENDENT_AMBULATORY_CARE_PROVIDER_SITE_OTHER): Payer: Medicare Other | Admitting: Nurse Practitioner

## 2016-07-03 VITALS — BP 130/86 | HR 90 | Temp 97.6°F | Ht 74.0 in | Wt 197.6 lb

## 2016-07-03 DIAGNOSIS — K047 Periapical abscess without sinus: Secondary | ICD-10-CM | POA: Diagnosis not present

## 2016-07-03 MED ORDER — CLINDAMYCIN HCL 300 MG PO CAPS: 300.0000 mg | ORAL_CAPSULE | Freq: Four times a day (QID) | ORAL | 0 refills | Status: DC

## 2016-07-03 NOTE — Patient Instructions (Signed)
Dental Abscess A dental abscess is pus in or around a tooth. HOME CARE  Take medicines only as told by your dentist.  If you were prescribed antibiotic medicine, finish all of it even if you start to feel better.  Rinse your mouth (gargle) often with salt water.  Do not drive or use heavy machinery, like a lawn mower, while taking pain medicine.  Do not apply heat to the outside of your mouth.  Keep all follow-up visits as told by your dentist. This is important. GET HELP IF:  Your pain is worse, and medicine does not help. GET HELP RIGHT AWAY IF:  You have a fever or chills.  Your symptoms suddenly get worse.  You have a very bad headache.  You have problems breathing or swallowing.  You have trouble opening your mouth.  You have puffiness (swelling) in your neck or around your eye.   This information is not intended to replace advice given to you by your health care provider. Make sure you discuss any questions you have with your health care provider.   Document Released: 02/09/2015 Document Reviewed: 02/09/2015 Elsevier Interactive Patient Education 2016 Elsevier Inc.  

## 2016-07-03 NOTE — Progress Notes (Signed)
   Subjective:    Patient ID: Dustin Cruz, male    DOB: 1966-04-10, 50 y.o.   MRN: [id number]  HPI  Patient comes in c/o left upper jaw pain- started swelling yesterday and has become very painful today. The dentist will not see him until infection is gone.   Review of Systems  Constitutional: Negative.   HENT: Negative.   Respiratory: Negative.   Cardiovascular: Negative.   Genitourinary: Negative.   Neurological: Negative.   Psychiatric/Behavioral: Negative.   All other systems reviewed and are negative.      Objective:   Physical Exam  Constitutional: He is oriented to person, place, and time. He appears well-developed and well-nourished.  HENT:  Left upper jaw edema with slight pain to touch. Abscess around left upper 1st molar.  Cardiovascular: Normal rate, regular rhythm and normal heart sounds.   Pulmonary/Chest: Effort normal and breath sounds normal.  Neurological: He is alert and oriented to person, place, and time.  Skin: Skin is warm.  Psychiatric: He has a normal mood and affect. His behavior is normal. Judgment and thought content normal.   BP 130/86 (BP Location: Left Arm, Patient Position: Sitting, Cuff Size: Normal)   Pulse 90   Temp 97.6 F (36.4 C) (Oral)   Ht 6\' 2"  (1.88 m)   Wt 197 lb 9.6 oz (89.6 kg)   SpO2 98%   BMI 25.37 kg/m        Assessment & Plan:   1. Abscessed tooth    Meds ordered this encounter  Medications  . clindamycin (CLEOCIN) 300 MG capsule    Sig: Take 1 capsule (300 mg total) by mouth 4 (four) times daily.    Dispense:  40 capsule    Refill:  0    Order Specific Question:   Supervising Provider    Answer:   Eustaquio Maize [[id number]]   Salt water gargle Ice if helps Get appointment with dentist RTO prn  Mary-Margaret Hassell Done, FNP

## 2016-07-05 ENCOUNTER — Ambulatory Visit: Payer: Medicare Other | Admitting: Family Medicine

## 2016-07-22 ENCOUNTER — Ambulatory Visit (INDEPENDENT_AMBULATORY_CARE_PROVIDER_SITE_OTHER): Payer: Medicare Other

## 2016-07-22 DIAGNOSIS — Z23 Encounter for immunization: Secondary | ICD-10-CM

## 2016-07-25 ENCOUNTER — Other Ambulatory Visit: Payer: Self-pay | Admitting: Nurse Practitioner

## 2016-07-25 ENCOUNTER — Telehealth: Payer: Self-pay | Admitting: Family Medicine

## 2016-07-25 DIAGNOSIS — K047 Periapical abscess without sinus: Secondary | ICD-10-CM

## 2016-07-25 MED ORDER — CLINDAMYCIN HCL 300 MG PO CAPS: 300.0000 mg | ORAL_CAPSULE | Freq: Four times a day (QID) | ORAL | 0 refills | Status: DC

## 2016-07-25 NOTE — Progress Notes (Signed)
refiled cleocin until can see dentist

## 2016-07-25 NOTE — Telephone Encounter (Signed)
Patient has completed previous antibiotic prescription for Clindamycin for dental infection but infection has returned.  Having swelling in jaw, dental pain.  Has an appointment scheduled with a dentist in [place] on 08/15/16.  In the meantime, he would like to know if you would prescribe another antibiotic to get him through until he can see the dentist.

## 2016-07-25 NOTE — Telephone Encounter (Signed)
Refilled clidamycin

## 2016-07-26 NOTE — Telephone Encounter (Signed)
Aware, script sent to pharmacy. 

## 2016-08-10 ENCOUNTER — Other Ambulatory Visit: Payer: Self-pay | Admitting: Family Medicine

## 2016-09-06 ENCOUNTER — Other Ambulatory Visit: Payer: Self-pay | Admitting: Family Medicine

## 2016-09-28 ENCOUNTER — Ambulatory Visit (INDEPENDENT_AMBULATORY_CARE_PROVIDER_SITE_OTHER): Payer: Medicare Other | Admitting: Family Medicine

## 2016-09-28 ENCOUNTER — Encounter: Payer: Self-pay | Admitting: Family Medicine

## 2016-09-28 VITALS — BP 135/86 | HR 107 | Temp 97.7°F | Ht 74.0 in | Wt 191.4 lb

## 2016-09-28 DIAGNOSIS — R05 Cough: Secondary | ICD-10-CM

## 2016-09-28 DIAGNOSIS — F329 Major depressive disorder, single episode, unspecified: Secondary | ICD-10-CM | POA: Diagnosis not present

## 2016-09-28 DIAGNOSIS — J441 Chronic obstructive pulmonary disease with (acute) exacerbation: Secondary | ICD-10-CM

## 2016-09-28 DIAGNOSIS — R059 Cough, unspecified: Secondary | ICD-10-CM

## 2016-09-28 DIAGNOSIS — F32A Depression, unspecified: Secondary | ICD-10-CM

## 2016-09-28 DIAGNOSIS — G479 Sleep disorder, unspecified: Secondary | ICD-10-CM | POA: Diagnosis not present

## 2016-09-28 MED ORDER — ALBUTEROL SULFATE HFA 108 (90 BASE) MCG/ACT IN AERS: 2.0000 | INHALATION_SPRAY | Freq: Four times a day (QID) | RESPIRATORY_TRACT | 2 refills | Status: DC | PRN

## 2016-09-28 MED ORDER — SERTRALINE HCL 100 MG PO TABS: 100.0000 mg | ORAL_TABLET | Freq: Every day | ORAL | 1 refills | Status: DC

## 2016-09-28 MED ORDER — TRAZODONE HCL 150 MG PO TABS: 150.0000 mg | ORAL_TABLET | Freq: Every day | ORAL | 1 refills | Status: DC

## 2016-09-28 MED ORDER — LEVOFLOXACIN 500 MG PO TABS: 500.0000 mg | ORAL_TABLET | Freq: Every day | ORAL | 0 refills | Status: DC

## 2016-09-28 NOTE — Progress Notes (Signed)
   HPI  Patient presents today here with cough and congestion, also needing refills on Zoloft and trazodone.  Cough Patient states that over the last one week she's had productive cough, congestion, and frontal headache. He does not have any albuterol that feels like to be very helpful. He denies any fever or chest pain. He has mild dyspnea. He is tolerating foods and fluids like usual.  Patient is a smoker, not interested in quitting.  Depression Denies SI state medications helping well, taking full tablet once daily of sertraline Needs refill, has not run out of medications.  Trazodone, difficulty sleeping Working well Complains of bilateral leg cramping and restlessness.  Diabetes Patient has good medication compliance, however has not followed up about diabetes. He agrees to come back in 2 weeks to discuss thoroughly.  PMH: Smoking status noted ROS: Per HPI  Objective: BP 135/86   Pulse (!) 107   Temp 97.7 F (36.5 C) (Oral)   Ht 6\' 2"  (1.88 m)   Wt 191 lb 6.4 oz (86.8 kg)   BMI 24.57 kg/m  Gen: NAD, alert, cooperative with exam HEENT: NCAT, TMs normal bilaterally, oropharynx moist and clear, nares clear CV: RRR, good S1/S2, no murmur Resp: Nonlabored, good air movement, some scattered expiratory wheeze with coarse breath sounds bilateral bases Ext: No edema, warm Neuro: Alert and oriented, No gross deficits  Assessment and plan:  # COPD exacerbation Likely underlying CVD, no formal Dx Tx with Levaquin Refill albuterol, plan PFTs when well  # Depression Stable, Continue zoloft, refilled  # Difficulty sleeping Stable, helped by trazodone- refilled  T2DM Uncontrolled- not evaluated today Discussed that we need more consistent f/u specifically for DM2, consider endo  he will rtc in 2 weeks for f/u DM2    Meds ordered this encounter  Medications  . traZODone (DESYREL) 150 MG tablet    Sig: Take 1 tablet (150 mg total) by mouth at bedtime.    Dispense:   90 tablet    Refill:  1  . sertraline (ZOLOFT) 100 MG tablet    Sig: Take 1 tablet (100 mg total) by mouth daily.    Dispense:  90 tablet    Refill:  1  . levofloxacin (LEVAQUIN) 500 MG tablet    Sig: Take 1 tablet (500 mg total) by mouth daily.    Dispense:  7 tablet    Refill:  0  . albuterol (PROVENTIL HFA;VENTOLIN HFA) 108 (90 Base) MCG/ACT inhaler    Sig: Inhale 2 puffs into the lungs every 6 (six) hours as needed for wheezing.    Dispense:  1 Inhaler    Refill:  [place], [facility] 09/28/2016, 12:47 PM

## 2016-09-28 NOTE — Patient Instructions (Signed)
Great to see you!  Come back in 2 weeks to discuss diabetes  Take all antibiotics, levaquin. Try the inhaler for cough.   Call or come back if you are not getting better as expected or if you are getting worse.

## 2016-10-10 ENCOUNTER — Other Ambulatory Visit: Payer: Self-pay | Admitting: Family Medicine

## 2016-10-12 ENCOUNTER — Ambulatory Visit: Payer: Medicare Other | Admitting: Family Medicine

## 2016-12-07 ENCOUNTER — Other Ambulatory Visit: Payer: Self-pay | Admitting: Family Medicine

## 2016-12-17 ENCOUNTER — Other Ambulatory Visit: Payer: Self-pay | Admitting: Family Medicine

## 2016-12-18 NOTE — Telephone Encounter (Signed)
Last A1C April 2017, last office for DM May 2017.  Please advise

## 2016-12-19 NOTE — Telephone Encounter (Signed)
Appt made for Th 4/5

## 2017-01-09 ENCOUNTER — Ambulatory Visit: Payer: Medicare Other

## 2017-01-11 ENCOUNTER — Ambulatory Visit: Payer: Medicare Other | Admitting: Family Medicine

## 2017-01-15 ENCOUNTER — Encounter: Payer: Self-pay | Admitting: Family Medicine

## 2017-01-15 ENCOUNTER — Ambulatory Visit (INDEPENDENT_AMBULATORY_CARE_PROVIDER_SITE_OTHER): Payer: Medicare Other | Admitting: Family Medicine

## 2017-01-15 VITALS — BP 141/76 | HR 96 | Temp 97.0°F | Ht 74.0 in | Wt 190.0 lb

## 2017-01-15 DIAGNOSIS — E1140 Type 2 diabetes mellitus with diabetic neuropathy, unspecified: Secondary | ICD-10-CM | POA: Diagnosis not present

## 2017-01-15 DIAGNOSIS — R059 Cough, unspecified: Secondary | ICD-10-CM

## 2017-01-15 DIAGNOSIS — F329 Major depressive disorder, single episode, unspecified: Secondary | ICD-10-CM | POA: Diagnosis not present

## 2017-01-15 DIAGNOSIS — F32A Depression, unspecified: Secondary | ICD-10-CM

## 2017-01-15 DIAGNOSIS — E785 Hyperlipidemia, unspecified: Secondary | ICD-10-CM

## 2017-01-15 DIAGNOSIS — R05 Cough: Secondary | ICD-10-CM

## 2017-01-15 DIAGNOSIS — Z794 Long term (current) use of insulin: Secondary | ICD-10-CM

## 2017-01-15 LAB — BAYER DCA HB A1C WAIVED

## 2017-01-15 MED ORDER — TRAZODONE HCL 150 MG PO TABS: 150.0000 mg | ORAL_TABLET | Freq: Every day | ORAL | 1 refills | Status: DC

## 2017-01-15 MED ORDER — INSULIN ASPART 100 UNIT/ML FLEXPEN: PEN_INJECTOR | SUBCUTANEOUS | 2 refills | Status: DC

## 2017-01-15 MED ORDER — SERTRALINE HCL 100 MG PO TABS: 100.0000 mg | ORAL_TABLET | Freq: Every day | ORAL | 1 refills | Status: DC

## 2017-01-15 MED ORDER — ALBUTEROL SULFATE HFA 108 (90 BASE) MCG/ACT IN AERS: 2.0000 | INHALATION_SPRAY | Freq: Four times a day (QID) | RESPIRATORY_TRACT | 2 refills | Status: DC | PRN

## 2017-01-15 NOTE — Patient Instructions (Signed)
Great to see you!  Re-start novolog 10 units each meal  You will be called for a endocrinology appointment

## 2017-01-15 NOTE — Progress Notes (Signed)
   HPI  Patient presents today here to follow-up for chronic medical conditions.  Diabetes Fasting blood sugars around 200, patient has symptoms of hypoglycemia below that. Patient taking 35 units of Lantus daily. Has run out of NovoLog.  He has nighttime leg cramps not helped by gabapentin  He is fasting, reports good med compliance with HLD meds.    PMH: Smoking status noted ROS: Per HPI  Objective: BP (!) 141/76   Pulse 96   Temp 97 F (36.1 C) (Oral)   Ht _0  (1.88 m)   Wt 190 lb (86.2 kg)   BMI 24.39 kg/m  Gen: NAD, alert, cooperative with exam HEENT: NCAT CV: RRR, good S1/S2, no murmur Resp: CTABL, no wheezes, non-labored Ext: No edema, warm Neuro: Alert and oriented, No gross deficits  Assessment and plan:  # T2DM Uncontrolled, on inculin + jentaduetto- Send to endo Discussed restart mealtime insulin.   # Depression  Stable, refill zoloft  # HLD Stable clinically, labs COntinue fibrate and statin  # Cough ( possible copd) Albuterol, some allergic component worse in spring     Orders Placed This Encounter  Procedures  . Microalbumin / creatinine urine ratio  . Bayer DCA Hb A1c Waived  . Lipid panel  . CMP14+EGFR  . CBC with Differential/Platelet  . Ambulatory referral to Endocrinology    Referral Priority:   Routine    Referral Type:   Consultation    Referral Reason:   Specialty Services Required    Number of Visits Requested:   1    Meds ordered this encounter  Medications  . albuterol (PROVENTIL HFA;VENTOLIN HFA) 108 (90 Base) MCG/ACT inhaler    Sig: Inhale 2 puffs into the lungs every 6 (six) hours as needed for wheezing.    Dispense:  1 Inhaler    Refill:  2  . insulin aspart (NOVOLOG FLEXPEN) 100 UNIT/ML FlexPen    Sig: Inject up to 10 units prior to meal per sliding scale.    Dispense:  15 mL    Refill:  2  . traZODone (DESYREL) 150 MG tablet    Sig: Take 1 tablet (150 mg total) by mouth at bedtime.    Dispense:  90 tablet      Refill:  1  . sertraline (ZOLOFT) 100 MG tablet    Sig: Take 1 tablet (100 mg total) by mouth daily.    Dispense:  90 tablet    Refill:  [place], MD [facility] 01/15/2017, 9:54 AM

## 2017-01-16 LAB — CBC WITH DIFFERENTIAL/PLATELET
BASOS ABS: 0.1 10*3/uL (ref 0.0–0.2)
BASOS: 1 %
EOS (ABSOLUTE): 0.1 10*3/uL (ref 0.0–0.4)
EOS: 1 %
HEMOGLOBIN: 16 g/dL (ref 13.0–17.7)
Hematocrit: 49.1 % (ref 37.5–51.0)
IMMATURE GRANS (ABS): 0 10*3/uL (ref 0.0–0.1)
Immature Granulocytes: 0 %
LYMPHS ABS: 2.6 10*3/uL (ref 0.7–3.1)
LYMPHS: 34 %
MCH: 31.3 pg (ref 26.6–33.0)
MCHC: 32.6 g/dL (ref 31.5–35.7)
MCV: 96 fL (ref 79–97)
MONOCYTES: 9 %
Monocytes Absolute: 0.7 10*3/uL (ref 0.1–0.9)
NEUTROS ABS: 4.2 10*3/uL (ref 1.4–7.0)
Neutrophils: 55 %
Platelets: 326 10*3/uL (ref 150–379)
RBC: 5.11 x10E6/uL (ref 4.14–5.80)
RDW: 13.1 % (ref 12.3–15.4)
WBC: 7.7 10*3/uL (ref 3.4–10.8)

## 2017-01-16 LAB — CMP14+EGFR
ALT: 13 IU/L (ref 0–44)
AST: 11 IU/L (ref 0–40)
Albumin/Globulin Ratio: 1.8 (ref 1.2–2.2)
Albumin: 4.1 g/dL (ref 3.5–5.5)
Alkaline Phosphatase: 53 IU/L (ref 39–117)
BILIRUBIN TOTAL: 0.3 mg/dL (ref 0.0–1.2)
BUN / CREAT RATIO: 10 (ref 9–20)
BUN: 12 mg/dL (ref 6–24)
CALCIUM: 9.4 mg/dL (ref 8.7–10.2)
CHLORIDE: 91 mmol/L — AB (ref 96–106)
CO2: 23 mmol/L (ref 18–29)
CREATININE: 1.24 mg/dL (ref 0.76–1.27)
GFR calc non Af Amer: 67 mL/min/{1.73_m2} (ref >59)
GFR, EST AFRICAN AMERICAN: 77 mL/min/{1.73_m2} (ref >59)
GLUCOSE: 483 mg/dL — AB (ref 65–99)
Globulin, Total: 2.3 g/dL (ref 1.5–4.5)
Potassium: 4.5 mmol/L (ref 3.5–5.2)
Sodium: 134 mmol/L (ref 134–144)
Total Protein: 6.4 g/dL (ref 6.0–8.5)

## 2017-01-16 LAB — MICROALBUMIN / CREATININE URINE RATIO
CREATININE, UR: 41.8 mg/dL
Microalb/Creat Ratio: 1584 mg/g creat — ABNORMAL HIGH (ref 0.0–30.0)
Microalbumin, Urine: 662.1 ug/mL

## 2017-01-16 LAB — LIPID PANEL
Chol/HDL Ratio: 4.3 ratio (ref 0.0–5.0)
Cholesterol, Total: 166 mg/dL (ref 100–199)
HDL: 39 mg/dL — ABNORMAL LOW (ref >39)
LDL Calculated: 52 mg/dL (ref 0–99)
Triglycerides: 376 mg/dL — ABNORMAL HIGH (ref 0–149)
VLDL CHOLESTEROL CAL: 75 mg/dL — AB (ref 5–40)

## 2017-01-19 DIAGNOSIS — Z23 Encounter for immunization: Secondary | ICD-10-CM | POA: Diagnosis not present

## 2017-02-07 ENCOUNTER — Other Ambulatory Visit: Payer: Self-pay | Admitting: Family Medicine

## 2017-02-07 DIAGNOSIS — E785 Hyperlipidemia, unspecified: Secondary | ICD-10-CM

## 2017-02-12 ENCOUNTER — Encounter: Payer: Self-pay | Admitting: Physician Assistant

## 2017-02-12 ENCOUNTER — Ambulatory Visit: Payer: Medicare Other

## 2017-02-12 ENCOUNTER — Ambulatory Visit (INDEPENDENT_AMBULATORY_CARE_PROVIDER_SITE_OTHER): Payer: Medicare Other | Admitting: Physician Assistant

## 2017-02-12 VITALS — BP 129/80 | HR 103 | Temp 99.5°F | Ht 74.0 in | Wt 185.4 lb

## 2017-02-12 DIAGNOSIS — J020 Streptococcal pharyngitis: Secondary | ICD-10-CM

## 2017-02-12 DIAGNOSIS — J029 Acute pharyngitis, unspecified: Secondary | ICD-10-CM | POA: Diagnosis not present

## 2017-02-12 LAB — CULTURE, GROUP A STREP

## 2017-02-12 LAB — RAPID STREP SCREEN (MED CTR MEBANE ONLY): STREP GP A AG, IA W/REFLEX: NEGATIVE

## 2017-02-12 MED ORDER — AMOXICILLIN 250 MG/5ML PO SUSR: 500.0000 mg | Freq: Three times a day (TID) | ORAL | 0 refills | Status: DC

## 2017-02-12 MED ORDER — PREDNISONE 10 MG (21) PO TBPK: ORAL_TABLET | ORAL | 0 refills | Status: DC

## 2017-02-12 NOTE — Patient Instructions (Signed)

## 2017-02-13 ENCOUNTER — Ambulatory Visit: Payer: Medicare Other | Admitting: "Endocrinology

## 2017-02-14 ENCOUNTER — Emergency Department (HOSPITAL_COMMUNITY): Payer: Medicare Other

## 2017-02-14 ENCOUNTER — Encounter (HOSPITAL_COMMUNITY): Payer: Self-pay | Admitting: Emergency Medicine

## 2017-02-14 ENCOUNTER — Emergency Department (HOSPITAL_COMMUNITY)
Admission: EM | Admit: 2017-02-14 | Discharge: 2017-02-14 | Disposition: A | Discharge status: Home / Self Care | Payer: Medicare Other | Attending: Emergency Medicine | Admitting: Emergency Medicine

## 2017-02-14 DIAGNOSIS — Z79899 Other long term (current) drug therapy: Secondary | ICD-10-CM | POA: Insufficient documentation

## 2017-02-14 DIAGNOSIS — R072 Precordial pain: Secondary | ICD-10-CM | POA: Diagnosis present

## 2017-02-14 DIAGNOSIS — I10 Essential (primary) hypertension: Secondary | ICD-10-CM | POA: Diagnosis not present

## 2017-02-14 DIAGNOSIS — J029 Acute pharyngitis, unspecified: Secondary | ICD-10-CM | POA: Diagnosis not present

## 2017-02-14 DIAGNOSIS — J069 Acute upper respiratory infection, unspecified: Secondary | ICD-10-CM | POA: Diagnosis not present

## 2017-02-14 DIAGNOSIS — F17210 Nicotine dependence, cigarettes, uncomplicated: Secondary | ICD-10-CM | POA: Insufficient documentation

## 2017-02-14 DIAGNOSIS — J441 Chronic obstructive pulmonary disease with (acute) exacerbation: Secondary | ICD-10-CM | POA: Insufficient documentation

## 2017-02-14 DIAGNOSIS — R061 Stridor: Secondary | ICD-10-CM | POA: Diagnosis not present

## 2017-02-14 DIAGNOSIS — J45909 Unspecified asthma, uncomplicated: Secondary | ICD-10-CM | POA: Diagnosis not present

## 2017-02-14 DIAGNOSIS — I2510 Atherosclerotic heart disease of native coronary artery without angina pectoris: Secondary | ICD-10-CM | POA: Diagnosis not present

## 2017-02-14 DIAGNOSIS — E119 Type 2 diabetes mellitus without complications: Secondary | ICD-10-CM | POA: Insufficient documentation

## 2017-02-14 DIAGNOSIS — Z794 Long term (current) use of insulin: Secondary | ICD-10-CM | POA: Insufficient documentation

## 2017-02-14 DIAGNOSIS — Z7982 Long term (current) use of aspirin: Secondary | ICD-10-CM | POA: Diagnosis not present

## 2017-02-14 DIAGNOSIS — R0789 Other chest pain: Secondary | ICD-10-CM | POA: Diagnosis not present

## 2017-02-14 DIAGNOSIS — R112 Nausea with vomiting, unspecified: Secondary | ICD-10-CM | POA: Diagnosis not present

## 2017-02-14 LAB — LIPASE, BLOOD: LIPASE: 18 U/L (ref 11–51)

## 2017-02-14 LAB — INFLUENZA PANEL BY PCR (TYPE A & B)
INFLBPCR: NEGATIVE
Influenza A By PCR: NEGATIVE

## 2017-02-14 LAB — CBC
HEMATOCRIT: 46.1 % (ref 39.0–52.0)
Hemoglobin: 16.4 g/dL (ref 13.0–17.0)
MCH: 32.7 pg (ref 26.0–34.0)
MCHC: 35.6 g/dL (ref 30.0–36.0)
MCV: 91.8 fL (ref 78.0–100.0)
PLATELETS: 285 10*3/uL (ref 150–400)
RBC: 5.02 MIL/uL (ref 4.22–5.81)
RDW: 12.6 % (ref 11.5–15.5)
WBC: 12.5 10*3/uL — AB (ref 4.0–10.5)

## 2017-02-14 LAB — HEPATIC FUNCTION PANEL
ALBUMIN: 3.6 g/dL (ref 3.5–5.0)
ALK PHOS: 54 U/L (ref 38–126)
ALT: 14 U/L — ABNORMAL LOW (ref 17–63)
AST: 13 U/L — ABNORMAL LOW (ref 15–41)
BILIRUBIN INDIRECT: 1.1 mg/dL — AB (ref 0.3–0.9)
Bilirubin, Direct: 0.1 mg/dL (ref 0.1–0.5)
Total Bilirubin: 1.2 mg/dL (ref 0.3–1.2)
Total Protein: 7.3 g/dL (ref 6.5–8.1)

## 2017-02-14 LAB — BASIC METABOLIC PANEL
Anion gap: 14 (ref 5–15)
BUN: 26 mg/dL — ABNORMAL HIGH (ref 6–20)
CHLORIDE: 96 mmol/L — AB (ref 101–111)
CO2: 23 mmol/L (ref 22–32)
CREATININE: 1.31 mg/dL — AB (ref 0.61–1.24)
Calcium: 9.2 mg/dL (ref 8.9–10.3)
Glucose, Bld: 295 mg/dL — ABNORMAL HIGH (ref 65–99)
POTASSIUM: 4.3 mmol/L (ref 3.5–5.1)
SODIUM: 133 mmol/L — AB (ref 135–145)

## 2017-02-14 LAB — RAPID STREP SCREEN (MED CTR MEBANE ONLY): STREPTOCOCCUS, GROUP A SCREEN (DIRECT): NEGATIVE

## 2017-02-14 LAB — TROPONIN I: Troponin I: <0.03 ng/mL (ref <0.03)

## 2017-02-14 MED ORDER — IPRATROPIUM-ALBUTEROL 0.5-2.5 (3) MG/3ML IN SOLN
3.0000 mL | Freq: Once | RESPIRATORY_TRACT | Status: AC
  Administered 2017-02-14: 3 mL via RESPIRATORY_TRACT
  Filled 2017-02-14: qty 3

## 2017-02-14 MED ORDER — SODIUM CHLORIDE 0.9 % IV SOLN
INTRAVENOUS | Status: DC
  Administered 2017-02-14: 16:00:00 via INTRAVENOUS

## 2017-02-14 MED ORDER — ONDANSETRON HCL 4 MG/2ML IJ SOLN
4.0000 mg | Freq: Once | INTRAMUSCULAR | Status: AC
  Administered 2017-02-14: 4 mg via INTRAVENOUS
  Filled 2017-02-14: qty 2

## 2017-02-14 MED ORDER — SODIUM CHLORIDE 0.9 % IV BOLUS (SEPSIS)
500.0000 mL | Freq: Once | INTRAVENOUS | Status: AC
  Administered 2017-02-14: 500 mL via INTRAVENOUS

## 2017-02-14 MED ORDER — HYDROMORPHONE HCL 1 MG/ML IJ SOLN
1.0000 mg | Freq: Once | INTRAMUSCULAR | Status: AC
  Administered 2017-02-14: 1 mg via INTRAVENOUS
  Filled 2017-02-14: qty 1

## 2017-02-14 NOTE — Progress Notes (Signed)
BP 129/80   Pulse (!) 103   Temp 99.5 F (37.5 C) (Oral)   Ht 6\' 2"  (1.88 m)   Wt 185 lb 6.4 oz (84.1 kg)   BMI 23.80 kg/m    Subjective:    Patient ID: Dustin Cruz, male    DOB: 08-03-1966, 51 y.o.   MRN: [id number]  HPI: Dustin Cruz is a 51 y.o. male presenting on 02/12/2017 for Sore Throat and Ear Pain  This patient has had less than 2 days severe fever, chills, myalgias.  Complains of sinus headache and postnasal drainage. There is copious drainage at times. Associated sore throat. Pain with swallowing, decreased appetite and headache.  Exposure to strep.  Relevant past medical, surgical, family and social history reviewed and updated as indicated. Allergies and medications reviewed and updated.  Past Medical History:  Diagnosis Date  . Anxiety   . Asthma   . COPD (chronic obstructive pulmonary disease) ([place])   . Coronary artery disease    s/p BMS to Ramus 10/2010;  Cath 01/22/12 prox 30-40% LAD, LCx ramus w/ hazy 70-80% in-stent restenosis, EF 60% treated medically  . Coronary artery disease    s/p BMS to Ramus 10/2010;  Cath 01/22/12 prox 30-40% LAD, LCx ramus w/ hazy 70-80% in-stent restenosis, EF 60% treated medically   . Depression   . Diabetes mellitus    Type 2  . Gastroparesis 05/2013  . GERD (gastroesophageal reflux disease)   . HTN (hypertension)   . Hyperlipidemia   . Hyperlipidemia   . Major depression, chronic 08/13/2012  . Melanoma ([place]) 2007   surgery at [facility], Followed by [facility]  . Myocardial infarction ([place]) 2012  . Obesity   . Tobacco abuse   . Tubular adenoma     Past Surgical History:  Procedure Laterality Date  . CARDIAC CATHETERIZATION     with stent  . COLONOSCOPY N/A 01/27/2013   CXK:GYJEHU and colonic polyps. Tubular adenomas  . CORONARY ANGIOPLASTY WITH STENT PLACEMENT    . ESOPHAGOGASTRODUODENOSCOPY  04/24/2012   [place]-mild erosive reflux esophagitis,dilated w/71F Venia Minks, small HH, minimal chronic gastric/bulbar erosions(No H pylori)   . LEFT HEART CATHETERIZATION WITH CORONARY ANGIOGRAM N/A 01/22/2012   Procedure: LEFT HEART CATHETERIZATION WITH CORONARY ANGIOGRAM;  Surgeon: Jolaine Artist, MD;  [facility] C[facility] LAB;  Service: Cardiovascular;  Laterality: N/A;  . melanoma surgery  2007   [place], removed lymph nodes under arm as well,Left abd    Review of Systems  Constitutional: Positive for fatigue and fever. Negative for appetite change.  HENT: Positive for sinus pressure and sore throat.   Eyes: Negative.  Negative for pain and visual disturbance.  Respiratory: Negative for cough, chest tightness, shortness of breath and wheezing.   Cardiovascular: Negative.  Negative for chest pain, palpitations and leg swelling.  Gastrointestinal: Negative.  Negative for abdominal pain, diarrhea, nausea and vomiting.  Endocrine: Negative.   Genitourinary: Negative.   Musculoskeletal: Positive for back pain and myalgias.  Skin: Negative.  Negative for color change and rash.  Neurological: Positive for headaches. Negative for weakness and numbness.  Psychiatric/Behavioral: Negative.     Allergies as of 02/12/2017   No Known Allergies     Medication List       Accurate as of 02/12/17 11:59 PM. Always use your most recent med list.          albuterol (2.5 MG/3ML) 0.083% nebulizer solution Commonly known as:  PROVENTIL USE ONE  IN NEBULIZER TWICE DAILY AS NEEDED FOR  WHEEZING OR SHORTNESS OF BREATH   albuterol 108 (90 Base) MCG/ACT inhaler Commonly known as:  PROVENTIL HFA;VENTOLIN HFA Inhale 2 puffs into the lungs every 6 (six) hours as needed for wheezing.   amoxicillin 250 MG/5ML suspension Commonly known as:  AMOXIL Take 10 mLs (500 mg total) by mouth 3 (three) times daily.   aspirin EC 81 MG tablet Take 81 mg by mouth daily.   atorvastatin 80 MG tablet Commonly known as:  LIPITOR TAKE ONE TABLET BY MOUTH ONCE DAILY   fenofibrate micronized 130 MG capsule Commonly known as:  ANTARA TAKE ONE CAPSULE BY MOUTH  ONCE DAILY BEFORE BREAKFAST   gabapentin 800 MG tablet Commonly known as:  NEURONTIN TAKE ONE TABLET BY MOUTH THREE TIMES DAILY   insulin aspart 100 UNIT/ML FlexPen Commonly known as:  NOVOLOG FLEXPEN Inject up to 10 units prior to meal per sliding scale.   Insulin Glargine 100 UNIT/ML Solostar Pen Commonly known as:  LANTUS Inject 62 Units into the skin at bedtime.   JENTADUETO 2.02-999 MG Tabs Generic drug:  Linagliptin-Metformin HCl TAKE 1 TABLET BY MOUTH TWICE DAILY WITH MEALS   nitroGLYCERIN 0.4 MG SL tablet Commonly known as:  NITROSTAT Place 1 tablet (0.4 mg total) under the tongue every 5 (five) minutes as needed. For chest pain.   omeprazole 40 MG capsule Commonly known as:  PRILOSEC TAKE ONE CAPSULE BY MOUTH TWICE DAILY   ondansetron 4 MG tablet Commonly known as:  ZOFRAN Take 1 tablet (4 mg total) by mouth every 6 (six) hours.   predniSONE 10 MG (21) Tbpk tablet Commonly known as:  STERAPRED UNI-PAK 21 TAB As directed x 6 days   sertraline 100 MG tablet Commonly known as:  ZOLOFT Take 1 tablet (100 mg total) by mouth daily.   traZODone 150 MG tablet Commonly known as:  DESYREL Take 1 tablet (150 mg total) by mouth at bedtime.          Objective:    BP 129/80   Pulse (!) 103   Temp 99.5 F (37.5 C) (Oral)   Ht 6\' 2"  (1.88 m)   Wt 185 lb 6.4 oz (84.1 kg)   BMI 23.80 kg/m   No Known Allergies  Physical Exam  Constitutional: He appears well-developed and well-nourished. No distress.  HENT:  Head: Normocephalic and atraumatic.  Right Ear: Hearing and tympanic membrane normal.  Left Ear: Hearing and tympanic membrane normal.  Nose: Nose normal.  Mouth/Throat: Oropharyngeal exudate, posterior oropharyngeal edema and posterior oropharyngeal erythema present.    Eyes: Conjunctivae and EOM are normal. Pupils are equal, round, and reactive to light.  Cardiovascular: Normal rate, regular rhythm and normal heart sounds.   Pulmonary/Chest: Effort  normal and breath sounds normal. No respiratory distress.  Skin: Skin is warm and dry.  Psychiatric: He has a normal mood and affect. His behavior is normal.  Nursing note and vitals reviewed.   Results for orders placed or performed in visit on 02/12/17  Rapid strep screen (not at [facility])  Result Value Ref Range   Strep Gp A Ag, IA W/Reflex Negative Negative  Culture, Group A Strep  Result Value Ref Range   Strep A Culture CANCELED       Assessment & Plan:   1. Sore throat - Rapid strep screen (not at [facility]) - Culture, Group A Strep  2. Strep throat - amoxicillin (AMOXIL) 250 MG/5ML suspension; Take 10 mLs (500 mg total) by mouth 3 (three) times daily.  Dispense: 300 mL;  Refill: 0 - predniSONE (STERAPRED UNI-PAK 21 TAB) 10 MG (21) TBPK tablet; As directed x 6 days  Dispense: 21 tablet; Refill: 0   Continue all other maintenance medications as listed above.  Follow up plan: Return if symptoms worsen or fail to improve.  Educational handout given for strep throat  Terald Sleeper PA-C [facility] [address]  [place], [place] [address] [phone]   02/14/2017, 5:32 PM

## 2017-02-14 NOTE — Discharge Instructions (Signed)
Continue your prednisone continue usual albuterol inhaler 2 puffs every 6 hours. Today's workup without any concerns from the heart and chest x-ray was negative for pneumonia. Strep test was negative as well. Also continue the antibiotic a primary care Dr. started Dustin Cruz. Return for any new or worse symptoms.

## 2017-02-14 NOTE — ED Provider Notes (Signed)
Dustin Cruz DEPT Provider Note   CSN: [id number] Arrival date & time: 02/14/17  1343   By signing my name below, I, Dustin Cruz, attest that this documentation has been prepared under the direction and in the presence of Dustin Sorrow, MD. Electronically Signed: Hilbert Cruz, Scribe. 02/14/17. 2:09 PM. History   Chief Complaint Chief Complaint  Patient presents with  . Chest Pain   The history is provided by the patient. No language interpreter was used.  Chest Pain   This is a new problem. The current episode started 3 to 5 hours ago. The problem occurs rarely. The problem has been resolved. The pain is present in the substernal region (Left). The pain is at a severity of 8/10. The pain does not radiate. Duration of episode(s) is 45 minutes. Associated symptoms include cough, diaphoresis, a fever, headaches, nausea, shortness of breath and vomiting. Pertinent negatives include no abdominal pain and no back pain.  HPI Comments: Dustin Cruz is a 51 y.o. male brought in by ambulance, who presents to the Emergency Department complaining of non-radiating Left substernal chest pain that began around 11 am. He rates his pain 8/10. He states that his pain lasted for around 45 minutes. He took some ASA with significant relief in his pain. He states that his pain is currently resolved. He also reports having a sore throat and congestion that began 4 days ago. He was seen 2 days ago for this issue and was given antibiotics at that time. His sore throat is still present. He reports having difficulty eating due to his sore throat recently. He has hx of an MI in 2012. He also had a stent placed in 2012. He denies dysuria, abdominal pain, or diarrhea.  Past Medical History:  Diagnosis Date  . Anxiety   . Asthma   . COPD (chronic obstructive pulmonary disease) ([place])   . Coronary artery disease    s/p BMS to [place] 10/2010;  Cath 01/22/12 prox 30-40% LAD, LCx ramus w/ hazy 70-80% in-stent  restenosis, EF 60% treated medically  . Coronary artery disease    s/p BMS to [place] 10/2010;  Cath 01/22/12 prox 30-40% LAD, LCx ramus w/ hazy 70-80% in-stent restenosis, EF 60% treated medically   . Depression   . Diabetes mellitus    Type 2  . Gastroparesis 05/2013  . GERD (gastroesophageal reflux disease)   . HTN (hypertension)   . Hyperlipidemia   . Hyperlipidemia   . Major depression, chronic 08/13/2012  . Melanoma ([place]) 2007   surgery at [facility], Followed by [place]  . Myocardial infarction ([place]) 2012  . Obesity   . Tobacco abuse   . Tubular adenoma     Patient Active Problem List   Diagnosis Date Noted  . Diabetic retinopathy ([place]) 09/21/2015  . Carotid bruit 09/21/2015  . Viral gastroenteritis 06/03/2015  . Hypertriglyceridemia 05/18/2015  . Vitamin D deficiency 12/29/2013  . Nausea alone 04/24/2013  . Frequency of urination 04/14/2013  . Fever, unspecified 04/14/2013  . Hepatic steatosis 12/28/2012  . Peripheral neuropathy 11/25/2012  . Melanoma of skin, site unspecified 11/25/2012  . Hyperlipidemia   . Depression   . Hypertension   . GERD (gastroesophageal reflux disease) 04/03/2012  . Tobacco abuse 03/14/2012  . Diabetes mellitus, type 2 ([place])   . 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 CARDIAC CATHETERIZATION     with stent  . COLONOSCOPY N/A 01/27/2013   YNW:GNFAOZ and colonic polyps.  Tubular adenomas  . CORONARY ANGIOPLASTY WITH STENT PLACEMENT    . ESOPHAGOGASTRODUODENOSCOPY  04/24/2012   [place]-mild erosive reflux esophagitis,dilated w/72F Venia Minks, small HH, minimal chronic gastric/bulbar erosions(No H pylori)  . LEFT HEART CATHETERIZATION WITH CORONARY ANGIOGRAM N/A 01/22/2012   Procedure: LEFT HEART CATHETERIZATION WITH CORONARY ANGIOGRAM;  Surgeon: Jolaine Artist, MD;  [facility];  Service: Cardiovascular;  Laterality: N/A;  . melanoma surgery  2007   [place],  removed lymph nodes under arm as well,Left abd       Home Medications    Prior to Admission medications   Medication Sig Start Date End Date Taking? Authorizing Provider  acetaminophen (TYLENOL) 500 MG tablet Take 500 mg by mouth every 6 (six) hours as needed for mild pain or headache.   Yes [provider]  albuterol (PROVENTIL HFA;VENTOLIN HFA) 108 (90 Base) MCG/ACT inhaler Inhale 2 puffs into the lungs every 6 (six) hours as needed for wheezing. 01/15/17  Yes Timmothy Euler, MD  albuterol (PROVENTIL) (2.5 MG/3ML) 0.083% nebulizer solution USE ONE  IN NEBULIZER TWICE DAILY AS NEEDED FOR WHEEZING OR SHORTNESS OF BREATH 09/10/14  Yes Lysbeth Penner, FNP  amoxicillin (AMOXIL) 250 MG/5ML suspension Take 10 mLs (500 mg total) by mouth 3 (three) times daily. 02/12/17  Yes Terald Sleeper, PA-C  aspirin EC 81 MG tablet Take 81 mg by mouth daily.   Yes [provider]  atorvastatin (LIPITOR) 80 MG tablet TAKE ONE TABLET BY MOUTH ONCE DAILY 02/07/17  Yes Timmothy Euler, MD  fenofibrate micronized (ANTARA) 130 MG capsule TAKE ONE CAPSULE BY MOUTH ONCE DAILY BEFORE BREAKFAST 12/07/16  Yes Timmothy Euler, MD  gabapentin (NEURONTIN) 800 MG tablet TAKE ONE TABLET BY MOUTH THREE TIMES DAILY 02/22/16  Yes Timmothy Euler, MD  insulin aspart (NOVOLOG FLEXPEN) 100 UNIT/ML FlexPen Inject up to 10 units prior to meal per sliding scale. 01/15/17  Yes Timmothy Euler, MD  Insulin Glargine (LANTUS) 100 UNIT/ML Solostar Pen Inject 62 Units into the skin at bedtime. Patient taking differently: Inject 26 Units into the skin at bedtime.  05/17/15  Yes Timmothy Euler, MD  JENTADUETO 2.02-999 MG TABS TAKE 1 TABLET BY MOUTH TWICE DAILY WITH MEALS 02/07/17  Yes Timmothy Euler, MD  omeprazole (PRILOSEC) 40 MG capsule TAKE ONE CAPSULE BY MOUTH TWICE DAILY 10/10/16  Yes Timmothy Euler, MD  ondansetron (ZOFRAN) 4 MG tablet Take 1 tablet (4 mg total) by mouth every 6 (six) hours. 02/20/16  Yes  Isla Pence, MD  predniSONE (STERAPRED UNI-PAK 21 TAB) 10 MG (21) TBPK tablet As directed x 6 days 02/12/17  Yes Terald Sleeper, PA-C  sertraline (ZOLOFT) 100 MG tablet Take 1 tablet (100 mg total) by mouth daily. 01/15/17  Yes Timmothy Euler, MD  traZODone (DESYREL) 150 MG tablet Take 1 tablet (150 mg total) by mouth at bedtime. 01/15/17  Yes Timmothy Euler, MD  nitroGLYCERIN (NITROSTAT) 0.4 MG SL tablet Place 1 tablet (0.4 mg total) under the tongue every 5 (five) minutes as needed. For chest pain. 08/13/12   Reece Packer, NP    Family History Family History  Problem Relation Age of Onset  . Stroke Mother   . Alcohol abuse Father   . Lung cancer    . Heart disease    . Other      not real familiar with family history  . Colon cancer Neg Hx   . Colon polyps Neg Hx  Social History Social History  Substance Use Topics  . Smoking status: Light Tobacco Smoker    Packs/day: 0.50    Years: 30.00    Types: Cigarettes    Start date: 10/09/1981  . Smokeless tobacco: Never Used     Comment: trying to quit, vapor   . 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     Allergies   Patient has no known allergies.   Review of Systems Review of Systems  Constitutional: Positive for diaphoresis and fever. Negative for chills.  HENT: Positive for congestion and sore throat. Negative for rhinorrhea.   Eyes: Negative for visual disturbance.  Respiratory: Positive for cough and shortness of breath.   Cardiovascular: Positive for chest pain.  Gastrointestinal: Positive for nausea and vomiting. Negative for abdominal pain and diarrhea.  Genitourinary: Negative for dysuria and hematuria.  Musculoskeletal: Negative for back pain and joint swelling.  Skin: Negative for rash.  Neurological: Positive for headaches.  Hematological: Does not bruise/bleed easily.     Physical Exam Updated  Vital Signs BP 134/90   Pulse 91   Temp 98.5 F (36.9 C) (Oral)   Resp 16   Ht 5\' 10"  (1.778 m)   Wt 83.9 kg   SpO2 96%   BMI 26.54 kg/m   Physical Exam  Constitutional: He is oriented to person, place, and time. He appears well-developed and well-nourished.  HENT:  Head: Normocephalic.  Mucous membranes are moist. Whit coating on the tongue. Uvula midline.  Eyes: EOM are normal.  Sclera red. Pupils normal. Eyes tracking normal.  Neck: Normal range of motion.  Cardiovascular: Normal rate and regular rhythm.   Pulmonary/Chest: Effort normal and breath sounds normal. No respiratory distress.  Lungs are clear bilaterally.   Abdominal: He exhibits no distension.  Bowel sounds decreased. Generalized soreness to abdomen. No guarding.  Musculoskeletal: Normal range of motion.  Neurological: He is alert and oriented to person, place, and time.  Psychiatric: He has a normal mood and affect.  Nursing note and vitals reviewed.  ED Treatments / Results  DIAGNOSTIC STUDIES: Oxygen Saturation is 94% on RA, normal by my interpretation.    COORDINATION OF CARE: 1:54 PM Discussed treatment plan with pt at bedside and pt agreed to plan. I will do a full cardiac work-up for the patient.  Labs (all labs ordered are listed, but only abnormal results are displayed) Labs Reviewed  BASIC METABOLIC PANEL - Abnormal; Notable for the following:       Result Value   Sodium 133 (*)    Chloride 96 (*)    Glucose, Bld 295 (*)    BUN 26 (*)    Creatinine, Ser 1.31 (*)    All other components within normal limits  CBC - Abnormal; Notable for the following:    WBC 12.5 (*)    All other components within normal limits  HEPATIC FUNCTION PANEL - Abnormal; Notable for the following:    AST 13 (*)    ALT 14 (*)    Indirect Bilirubin 1.1 (*)    All other components within normal limits  RAPID STREP SCREEN (NOT AT [facility])  CULTURE, GROUP A STREP [facility])  TROPONIN I  LIPASE, BLOOD  INFLUENZA PANEL BY  PCR (TYPE A & B)  TROPONIN I    EKG  EKG Interpretation  Date/Time:  Wednesday Feb 14 2017 13:48:59 EDT Ventricular Rate:  96 PR Interval:    QRS Duration: 141 QT Interval:  376 QTC Calculation: 476 R Axis:   46 Text Interpretation:  Sinus rhythm Prolonged PR interval Left bundle branch block Confirmed by Jhania Etherington  MD, Uziel Covault [phone]) on 02/14/2017 2:10:30 PM       Radiology Dg Chest 2 View  Result Date: 02/14/2017 CLINICAL DATA:  Nausea and diaphoresis.  Chest pain. EXAM: CHEST  2 VIEW COMPARISON:  PA and lateral chest 01/10/2015 and 01/13/2016. CT chest 05/08/2012. FINDINGS: The chest appears somewhat hyperexpanded with attenuation of the pulmonary vasculature. Lungs are clear. Heart size is normal. No pneumothorax or pleural effusion. No bony abnormality. IMPRESSION: Findings suggestive of emphysema.  No acute disease. Electronically Signed   By: Inge Rise M.D.   On: 02/14/2017 14:14    Procedures Procedures (including critical care time)  Medications Ordered in ED Medications  0.9 %  sodium chloride infusion ( Intravenous Stopped 02/14/17 2016)  sodium chloride 0.9 % bolus 500 mL (0 mLs Intravenous Stopped 02/14/17 1613)  HYDROmorphone (DILAUDID) injection 1 mg (1 mg Intravenous Given 02/14/17 1454)  ondansetron (ZOFRAN) injection 4 mg (4 mg Intravenous Given 02/14/17 1454)  ipratropium-albuterol (DUONEB) 0.5-2.5 (3) MG/3ML nebulizer solution 3 mL (3 mLs Nebulization Given 02/14/17 1743)     Initial Impression / Assessment and Plan / ED Course  I have reviewed the triage vital signs and the nursing notes.  Pertinent labs & imaging results that were available during my care of the patient were reviewed by me and considered in my medical decision making (see chart for details).     Workup for the chest pain and no shortness of breath without any acute findings. Patient's breathing was improved with albuterol Atrovent nebulizer. Patient's already on prednisone. Troponins 2  were negative. Strep test was negative the patient has been on the antibiotic amoxicillin for the past 2 days. We'll have him continue that. We'll have him use albuterol inhaler 2 puffs every 6 hours and continue the prednisone. Patient stable for discharge home.  Final Clinical Impressions(s) / ED Diagnoses   Final diagnoses:  Precordial pain  Viral upper respiratory tract infection  Pharyngitis, unspecified etiology  Chronic obstructive pulmonary disease with acute exacerbation (HCC)    New Prescriptions New Prescriptions   No medications on file   I personally performed the services described in this documentation, which was scribed in my presence. The recorded information has been reviewed and is accurate.      Dustin Sorrow, MD 02/14/17 2025

## 2017-02-14 NOTE — ED Triage Notes (Signed)
Per EMS. Patient from home calling out due to chest pain. Patient states nausea and diaphoresis. States chest pain resolved. Also history of MI in 2012 with stint placement.

## 2017-02-17 LAB — CULTURE, GROUP A STREP (THRC)

## 2017-03-08 ENCOUNTER — Ambulatory Visit (INDEPENDENT_AMBULATORY_CARE_PROVIDER_SITE_OTHER): Payer: Medicare Other | Admitting: Family Medicine

## 2017-03-08 ENCOUNTER — Encounter: Payer: Self-pay | Admitting: Family Medicine

## 2017-03-08 VITALS — BP 123/82 | HR 85 | Temp 98.3°F | Ht 74.0 in | Wt 186.0 lb

## 2017-03-08 DIAGNOSIS — W57XXXA Bitten or stung by nonvenomous insect and other nonvenomous arthropods, initial encounter: Secondary | ICD-10-CM | POA: Diagnosis not present

## 2017-03-08 DIAGNOSIS — S70362A Insect bite (nonvenomous), left thigh, initial encounter: Secondary | ICD-10-CM | POA: Diagnosis not present

## 2017-03-08 MED ORDER — DOXYCYCLINE HYCLATE 100 MG PO TABS: 100.0000 mg | ORAL_TABLET | Freq: Two times a day (BID) | ORAL | 0 refills | Status: DC

## 2017-03-08 MED ORDER — HYDROCORTISONE 1 % EX OINT: 1.0000 "application " | TOPICAL_OINTMENT | Freq: Two times a day (BID) | CUTANEOUS | 0 refills | Status: DC

## 2017-03-08 NOTE — Progress Notes (Signed)
   BP 123/82   Pulse 85   Temp 98.3 F (36.8 C) (Oral)   Ht 6\' 2"  (1.88 m)   Wt 186 lb (84.4 kg)   BMI 23.88 kg/m    Subjective:    Patient ID: Dustin Cruz, male    DOB: May 22, 1966, 51 y.o.   MRN: [id number]  HPI: Dustin Cruz is a 51 y.o. male presenting on 03/08/2017 for Tick bite/bug bite (left hand, arm and left leg)   HPI Tick bite and rash Patient comes in today because he pulled to takes off of his left leg, one was in the popliteal fossa and one on his hamstring. He pulled these ticks off himself 2 days ago and has developed pruritic rash around the 1 in the popliteal fossa. The other one is just a simple spot that is not as pruritic. He denies any fevers or chills or joint aches. He denies any rashes anywhere else on his body.  Relevant past medical, surgical, family and social history reviewed and updated as indicated. Interim medical history since our last visit reviewed. Allergies and medications reviewed and updated.  Review of Systems  Constitutional: Negative for chills and fever.  Eyes: Negative for discharge.  Respiratory: Negative for shortness of breath and wheezing.   Cardiovascular: Negative for chest pain and leg swelling.  Musculoskeletal: Negative for back pain and gait problem.  Skin: Positive for color change and rash.  All other systems reviewed and are negative.   Per HPI unless specifically indicated above        Objective:    BP 123/82   Pulse 85   Temp 98.3 F (36.8 C) (Oral)   Ht 6\' 2"  (1.88 m)   Wt 186 lb (84.4 kg)   BMI 23.88 kg/m   Wt Readings from Last 3 Encounters:  03/08/17 186 lb (84.4 kg)  02/14/17 185 lb (83.9 kg)  02/12/17 185 lb 6.4 oz (84.1 kg)    Physical Exam  Constitutional: He is oriented to person, place, and time. He appears well-developed and well-nourished. No distress.  Eyes: Conjunctivae are normal. No scleral icterus.  Cardiovascular: Normal rate, regular rhythm, normal heart sounds and intact distal  pulses.   No murmur heard. Pulmonary/Chest: Effort normal and breath sounds normal. No respiratory distress. He has no wheezes.  Musculoskeletal: Normal range of motion.  Neurological: He is alert and oriented to person, place, and time. Coordination normal.  Skin: Skin is warm and dry. Rash noted. Rash is maculopapular (1.5 cm round area of maculopapular rash with excoriations and crusting where he has been itching at it. This is in his popliteal fossa. Small eschar on left lateral hamstring). He is not diaphoretic.  Psychiatric: He has a normal mood and affect. His behavior is normal.  Nursing note and vitals reviewed.       Assessment & Plan:   Problem List Items Addressed This Visit    None    Visit Diagnoses    Tick bite, initial encounter    -  Primary   Relevant Medications   doxycycline (VIBRA-TABS) 100 MG tablet   hydrocortisone 1 % ointment       Follow up plan: Return if symptoms worsen or fail to improve.  Counseling provided for all of the vaccine components No orders of the defined types were placed in this encounter.   Caryl Pina, MD [facility] 03/08/2017, 10:38 AM

## 2017-03-14 ENCOUNTER — Other Ambulatory Visit: Payer: Self-pay | Admitting: Family Medicine

## 2017-04-13 ENCOUNTER — Other Ambulatory Visit: Payer: Self-pay | Admitting: Family Medicine

## 2017-04-16 ENCOUNTER — Other Ambulatory Visit: Payer: Self-pay | Admitting: Family Medicine

## 2017-04-19 ENCOUNTER — Other Ambulatory Visit: Payer: Self-pay | Admitting: Family Medicine

## 2017-04-26 ENCOUNTER — Encounter: Payer: Self-pay | Admitting: Family Medicine

## 2017-04-26 ENCOUNTER — Ambulatory Visit (INDEPENDENT_AMBULATORY_CARE_PROVIDER_SITE_OTHER): Payer: Medicare Other | Admitting: Family Medicine

## 2017-04-26 VITALS — BP 121/78 | HR 88 | Temp 97.2°F | Ht 74.0 in | Wt 184.2 lb

## 2017-04-26 DIAGNOSIS — G63 Polyneuropathy in diseases classified elsewhere: Secondary | ICD-10-CM | POA: Diagnosis not present

## 2017-04-26 MED ORDER — GABAPENTIN 800 MG PO TABS: 800.0000 mg | ORAL_TABLET | Freq: Three times a day (TID) | ORAL | 2 refills | Status: DC

## 2017-04-26 NOTE — Progress Notes (Signed)
Subjective:    Patient ID: Dustin Cruz, male    DOB: [birth year], 51 y.o.   MRN: [id number]  HPI 51 year old male who presents today with burning of his feet. He does have a history of poorly controlled diabetes with last A1c 13.7 in April. He does have a history of peripheral neuropathy. Symptoms have been relieved with gabapentin 800 mg 3 times a day but he lost his medicine and it could not be refilled per history.  Patient Active Problem List   Diagnosis Date Noted  . Diabetic retinopathy ([place]) 09/21/2015  . Carotid bruit 09/21/2015  . Viral gastroenteritis 06/03/2015  . Hypertriglyceridemia 05/18/2015  . Vitamin D deficiency 12/29/2013  . Nausea alone 04/24/2013  . Frequency of urination 04/14/2013  . Fever, unspecified 04/14/2013  . Hepatic steatosis 12/28/2012  . Peripheral neuropathy 11/25/2012  . Melanoma of skin, site unspecified 11/25/2012  . Hyperlipidemia   . Depression   . Hypertension   . GERD (gastroesophageal reflux disease) 04/03/2012  . Tobacco abuse 03/14/2012  . Diabetes mellitus, type 2 ([place])   . Arteriosclerotic cardiovascular disease (ASCVD) 01/06/2011   Outpatient Encounter Prescriptions as of 04/26/2017  Medication Sig  . acetaminophen (TYLENOL) 500 MG tablet Take 500 mg by mouth every 6 (six) hours as needed for mild pain or headache.  . albuterol (PROVENTIL HFA;VENTOLIN HFA) 108 (90 Base) MCG/ACT inhaler Inhale 2 puffs into the lungs every 6 (six) hours as needed for wheezing.  [name] albuterol (PROVENTIL) (2.5 MG/3ML) 0.083% nebulizer solution USE ONE  IN NEBULIZER TWICE DAILY AS NEEDED FOR WHEEZING OR SHORTNESS OF BREATH  . aspirin EC 81 MG tablet Take 81 mg by mouth daily.  [name] atorvastatin (LIPITOR) 80 MG tablet TAKE ONE TABLET BY MOUTH ONCE DAILY  . fenofibrate micronized (ANTARA) 130 MG capsule TAKE 1 CAPSULE BY MOUTH ONCE DAILY BEFORE BREAKFAST  . gabapentin (NEURONTIN) 800 MG tablet TAKE ONE TABLET BY MOUTH THREE TIMES DAILY  . hydrocortisone 1 %  ointment Apply 1 application topically 2 (two) times daily.  . insulin aspart (NOVOLOG FLEXPEN) 100 UNIT/ML FlexPen Inject up to 10 units prior to meal per sliding scale.  . Insulin Glargine (LANTUS) 100 UNIT/ML Solostar Pen Inject 62 Units into the skin at bedtime. (Patient taking differently: Inject 26 Units into the skin at bedtime. )  . JENTADUETO 2.02-999 MG TABS TAKE 1 TABLET BY MOUTH TWICE DAILY WITH MEALS  . nitroGLYCERIN (NITROSTAT) 0.4 MG SL tablet Place 1 tablet (0.4 mg total) under the tongue every 5 (five) minutes as needed. For chest pain.  [name] omeprazole (PRILOSEC) 40 MG capsule TAKE ONE CAPSULE BY MOUTH TWICE DAILY  . ondansetron (ZOFRAN) 4 MG tablet Take 1 tablet (4 mg total) by mouth every 6 (six) hours.  . sertraline (ZOLOFT) 100 MG tablet Take 1 tablet (100 mg total) by mouth daily.  . traZODone (DESYREL) 150 MG tablet Take 1 tablet (150 mg total) by mouth at bedtime.  . [DISCONTINUED] gabapentin (NEURONTIN) 800 MG tablet TAKE ONE TABLET BY MOUTH THREE TIMES DAILY  . [DISCONTINUED] predniSONE (STERAPRED UNI-PAK 21 TAB) 10 MG (21) TBPK tablet As directed x 6 days  . [DISCONTINUED] amoxicillin (AMOXIL) 250 MG/5ML suspension Take 10 mLs (500 mg total) by mouth 3 (three) times daily.  . [DISCONTINUED] doxycycline (VIBRA-TABS) 100 MG tablet Take 1 tablet (100 mg total) by mouth 2 (two) times daily.   No facility-administered encounter medications on file as of 04/26/2017.       Review of Systems  Constitutional:  Negative.   Endocrine: Positive for polydipsia and polyuria.  Neurological: Positive for numbness.  Psychiatric/Behavioral: Negative.        Objective:   Physical Exam  Constitutional: He appears well-developed and well-nourished.   Foot exam: There was scant hair growing on the tops of toes and pulses were present although weak. Skin appeared healthy. There were no calluses or sores or other deformities.       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 We will restart gabapentin 800 mg 3 times a day. Patient encouraged to keep sugars controlled as best he can. He has follow-up appointment next month with his PCP

## 2017-05-10 ENCOUNTER — Other Ambulatory Visit: Payer: Self-pay | Admitting: Family Medicine

## 2017-05-10 DIAGNOSIS — E785 Hyperlipidemia, unspecified: Secondary | ICD-10-CM

## 2017-07-10 ENCOUNTER — Other Ambulatory Visit: Payer: Self-pay | Admitting: Family Medicine

## 2017-07-13 ENCOUNTER — Other Ambulatory Visit: Payer: Self-pay

## 2017-07-13 DIAGNOSIS — E1140 Type 2 diabetes mellitus with diabetic neuropathy, unspecified: Secondary | ICD-10-CM

## 2017-07-13 NOTE — Telephone Encounter (Signed)
Patient last had A1C checked in April and it was greater than 14. Please advise on refill

## 2017-07-16 MED ORDER — INSULIN GLARGINE 100 UNIT/ML SOLOSTAR PEN: 35.0000 [IU] | PEN_INJECTOR | Freq: Every day | SUBCUTANEOUS | 2 refills | Status: DC

## 2017-07-19 ENCOUNTER — Other Ambulatory Visit: Payer: Self-pay | Admitting: Family Medicine

## 2017-07-31 ENCOUNTER — Ambulatory Visit: Payer: Medicare Other | Admitting: Family Medicine

## 2017-08-03 ENCOUNTER — Ambulatory Visit (INDEPENDENT_AMBULATORY_CARE_PROVIDER_SITE_OTHER): Payer: Medicare Other | Admitting: *Deleted

## 2017-08-03 DIAGNOSIS — Z23 Encounter for immunization: Secondary | ICD-10-CM | POA: Diagnosis not present

## 2017-08-08 ENCOUNTER — Encounter: Payer: Self-pay | Admitting: *Deleted

## 2017-08-08 ENCOUNTER — Ambulatory Visit (INDEPENDENT_AMBULATORY_CARE_PROVIDER_SITE_OTHER): Payer: Medicare Other | Admitting: *Deleted

## 2017-08-08 VITALS — BP 120/71 | HR 85 | Ht 72.0 in | Wt 215.0 lb

## 2017-08-08 DIAGNOSIS — Z0000 Encounter for general adult medical examination without abnormal findings: Secondary | ICD-10-CM

## 2017-08-08 MED ORDER — FREESTYLE LIBRE 14 DAY READER DEVI
1.0000 | Freq: Four times a day (QID) | 0 refills | Status: DC | PRN
Start: 2017-08-08 — End: 2017-10-15

## 2017-08-08 MED ORDER — FREESTYLE LIBRE 14 DAY SENSOR MISC: 1.0000 | 2 refills | Status: DC

## 2017-08-08 NOTE — Patient Instructions (Signed)
  [name] , Thank you for taking time to come for your Medicare Wellness Visit. I appreciate your ongoing commitment to your health goals. Please review the following plan we discussed and let me know if I can assist you in the future.   These are the goals we discussed: Goals    . Exercise 150 minutes per week (moderate activity)          Walk for 30 minutes a day    . Have 3 meals a day       This is a list of the screening recommended for you and due dates:  Health Maintenance  Topic Date Due  . Eye exam for diabetics  09/13/2016  . Tetanus Vaccine  04/04/2017  . Pneumococcal vaccine (2) 07/05/2017  . Hemoglobin A1C  07/17/2017  . Urine Protein Check  01/15/2018  . Complete foot exam   04/26/2018  . Colon Cancer Screening  01/28/2023  . Flu Shot  Completed  . HIV Screening  Addressed

## 2017-08-09 ENCOUNTER — Other Ambulatory Visit: Payer: Self-pay | Admitting: Family Medicine

## 2017-08-09 DIAGNOSIS — E785 Hyperlipidemia, unspecified: Secondary | ICD-10-CM

## 2017-08-09 NOTE — Telephone Encounter (Signed)
Next Ov 08/17/17

## 2017-08-10 NOTE — Progress Notes (Signed)
Subjective:   Dustin Cruz is a 51 y.o. male who presents for an Initial Medicare Annual Wellness Visit. [name] lives in an apartment alone but he has a friend that lives close by.   Review of Systems  Health is about the same as last year.   Cardiac Risk Factors include: dyslipidemia;hypertension;diabetes mellitus;male gender;smoking/ tobacco exposure;sedentary lifestyle;obesity (BMI >30kg/m2)  Other systems negative today.    Objective:    Today's Vitals   08/08/17 1123  BP: 120/71  Pulse: 85  Weight: 215 lb (97.5 kg)  Height: 6' (1.829 m)   Body mass index is 29.16 kg/m.  Current Medications (verified) Outpatient Encounter Prescriptions as of 08/08/2017  Medication Sig  . acetaminophen (TYLENOL) 500 MG tablet Take 500 mg by mouth every 6 (six) hours as needed for mild pain or headache.  . albuterol (PROVENTIL HFA;VENTOLIN HFA) 108 (90 Base) MCG/ACT inhaler Inhale 2 puffs into the lungs every 6 (six) hours as needed for wheezing.  [name] albuterol (PROVENTIL) (2.5 MG/3ML) 0.083% nebulizer solution USE ONE  IN NEBULIZER TWICE DAILY AS NEEDED FOR WHEEZING OR SHORTNESS OF BREATH  . aspirin EC 81 MG tablet Take 81 mg by mouth daily.  . fenofibrate micronized (ANTARA) 130 MG capsule TAKE 1 CAPSULE BY MOUTH ONCE DAILY BEFORE BREAKFAST  . gabapentin (NEURONTIN) 800 MG tablet Take 1 tablet (800 mg total) by mouth 3 (three) times daily.  . hydrocortisone 1 % ointment Apply 1 application topically 2 (two) times daily.  . insulin aspart (NOVOLOG FLEXPEN) 100 UNIT/ML FlexPen Inject up to 10 units prior to meal per sliding scale.  . Insulin Glargine (LANTUS) 100 UNIT/ML Solostar Pen Inject 35 Units into the skin at bedtime.  . nitroGLYCERIN (NITROSTAT) 0.4 MG SL tablet Place 1 tablet (0.4 mg total) under the tongue every 5 (five) minutes as needed. For chest pain.  [name] omeprazole (PRILOSEC) 40 MG capsule TAKE ONE CAPSULE BY MOUTH TWICE DAILY  . ondansetron (ZOFRAN) 4 MG tablet Take 1 tablet (4  mg total) by mouth every 6 (six) hours.  . sertraline (ZOLOFT) 100 MG tablet TAKE 1 TABLET BY MOUTH ONCE DAILY  . [DISCONTINUED] atorvastatin (LIPITOR) 80 MG tablet TAKE 1 TABLET BY MOUTH ONCE DAILY  . [DISCONTINUED] JENTADUETO 2.02-999 MG TABS TAKE 1 TABLET BY MOUTH TWICE DAILY WITH MEALS  . [DISCONTINUED] traZODone (DESYREL) 150 MG tablet Take 1 tablet (150 mg total) by mouth at bedtime.  . Continuous Blood Gluc Receiver (FREESTYLE LIBRE 14 DAY READER) DEVI 1 Device by Does not apply route 4 (four) times daily as needed.  . Continuous Blood Gluc Sensor (FREESTYLE LIBRE 14 DAY SENSOR) MISC 1 Device by Does not apply route every 14 (fourteen) days.   No facility-administered encounter medications on file as of 08/08/2017.     Allergies (verified) Patient has no known allergies.   History: Past Medical History:  Diagnosis Date  . Anxiety   . Asthma   . COPD (chronic obstructive pulmonary disease) ([place])   . Coronary artery disease    s/p BMS to Ramus 10/2010;  Cath 01/22/12 prox 30-40% LAD, LCx ramus w/ hazy 70-80% in-stent restenosis, EF 60% treated medically  . Coronary artery disease    s/p BMS to [facility] 10/2010;  Cath 01/22/12 prox 30-40% LAD, LCx ramus w/ hazy 70-80% in-stent restenosis, EF 60% treated medically   . Depression   . Diabetes mellitus    Type 2  . Gastroparesis 05/2013  . GERD (gastroesophageal reflux disease)   . HTN (  hypertension)   . Hyperlipidemia   . Hyperlipidemia   . Major depression, chronic 08/13/2012  . Melanoma ([place]) 2007   surgery at [facility], Followed by [facility]  . Myocardial infarction ([place]) 2012  . Obesity   . Tobacco abuse   . Tubular adenoma    Past Surgical History:  Procedure Laterality Date  . CARDIAC CATHETERIZATION     with stent  . COLONOSCOPY N/A 01/27/2013   DQQ:IWLNLG and colonic polyps. Tubular adenomas  . CORONARY ANGIOPLASTY WITH STENT PLACEMENT    . ESOPHAGOGASTRODUODENOSCOPY  04/24/2012   [place]-mild erosive reflux  esophagitis,dilated w/63F Venia Minks, small HH, minimal chronic gastric/bulbar erosions(No H pylori)  . LEFT HEART CATHETERIZATION WITH CORONARY ANGIOGRAM N/A 01/22/2012   Procedure: LEFT HEART CATHETERIZATION WITH CORONARY ANGIOGRAM;  Surgeon: Jolaine Artist, MD;  Location: [facility] CATH LAB;  Service: Cardiovascular;  Laterality: N/A;  . melanoma surgery  2007   [place], removed lymph nodes under arm as well,Left abd   Family History  Problem Relation Age of Onset  . Stroke Mother   . Alcohol abuse Father   . Lung cancer Unknown   . Heart disease Unknown   . Other Unknown        not real familiar with family history  . Colon cancer Neg Hx   . Colon polyps Neg Hx    Social History   Occupational History  . disabled    Social History Main Topics  . Smoking status: Heavy Tobacco Smoker    Packs/day: 1.00    Years: 30.00    Types: Cigarettes    Start date: 10/09/1981  . Smokeless tobacco: Never Used  . 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  . Drug use: No  . Sexual activity: Yes   Tobacco Counseling Not interested in quitting current tobacco use.   Activities of Daily Living In your present state of health, do you have any difficulty performing the following activities: 08/08/2017  Hearing? N  Vision? N  Difficulty concentrating or making decisions? N  Walking or climbing stairs? N  Dressing or bathing? N  Doing errands, shopping? N  Preparing Food and eating ? N  Using the Toilet? N  In the past six months, have you accidently leaked urine? N  Do you have problems with loss of bowel control? N  Managing your Medications? N  Managing your Finances? N  Housekeeping or managing your Housekeeping? N  Some recent data might be hidden    Immunizations and Health Maintenance Immunization History  Administered Date(s) Administered  . Influenza Split 07/05/2012  . Influenza,inj,Quad PF,6+  Mos 09/24/2013, 07/28/2014, 07/09/2015, 07/22/2016, 08/03/2017  . Pneumococcal Polysaccharide-23 07/05/2012   Health Maintenance Due  Topic Date Due  . OPHTHALMOLOGY EXAM  09/13/2016  . TETANUS/TDAP  04/04/2017  . PNEUMOCOCCAL POLYSACCHARIDE VACCINE (2) 07/05/2017  . HEMOGLOBIN A1C  07/17/2017    Patient Care Team: Timmothy Euler, MD as PCP - General (Family Medicine) Danie Binder, MD (Gastroenterology)  No surgeries or hospitalizations but patient did have an ER visit on 02/14/17 for precordial chest pain.     Assessment:   This is a routine wellness examination for [place].   Hearing/Vision screen No deficits noted. Eye exam is overdue.  Dietary issues and exercise activities discussed: Current Exercise Habits: The patient does not participate in regular exercise at present (Patient stays active), Exercise limited by: None identified  Diet: Eats mainly 2 meals a day  Goals    . Exercise 150 minutes per week (moderate activity)          Walk for 30 minutes a day    . Have 3 meals a day      Depression Screen PHQ 2/9 Scores 08/08/2017 04/26/2017 03/08/2017 02/12/2017  PHQ - 2 Score 2 1 0 0  PHQ- 9 Score 3 - - -    Fall Risk Fall Risk  08/08/2017 03/08/2017 02/12/2017 01/15/2017 09/28/2016  Falls in the past year? No No No No No  Number falls in past yr: - - - - -  Injury with Fall? - - - - -    Cognitive Function: MMSE - Mini Mental State Exam 08/08/2017  Not completed: (No Data)  Orientation to time 4  Orientation to Place 4  Registration 3  Attention/ Calculation 0  Attention/Calculation-comments not attempted  Recall 2  Language- name 2 objects 2  Language- repeat 0  Language- follow 3 step command 3  Language- read & follow direction 1  Write a sentence 0  Copy design 0  Total score 19        Screening Tests Health Maintenance  Topic Date Due  . OPHTHALMOLOGY EXAM  09/13/2016  . TETANUS/TDAP  04/04/2017  . PNEUMOCOCCAL POLYSACCHARIDE VACCINE  (2) 07/05/2017  . HEMOGLOBIN A1C  07/17/2017  . URINE MICROALBUMIN  01/15/2018  . FOOT EXAM  04/26/2018  . COLONOSCOPY  01/28/2023  . INFLUENZA VACCINE  Completed  . HIV Screening  Addressed        Plan:  Keep f/u with PCP Schedule eye exam Exercise-recommend a minimum of 150 min of moderate activity a week  Freestyle Libre sent in to pharmacy. Patient is dual eligible and should have very limited out of pocket cost. This will help with blood sugar management.  Recommended Tdap and Prevnar.  Stressed the importance of eating regularly. Should plan three meals a day.   I have personally reviewed and noted the following in the patient's chart:   . Medical and social history . Use of alcohol, tobacco or illicit drugs  . Current medications and supplements . Functional ability and status . Nutritional status . Physical activity . Advanced directives . List of other physicians . Hospitalizations, surgeries, and ER visits in previous 12 months . Vitals . Screenings to include cognitive, depression, and falls . Referrals and appointments  In addition, I have reviewed and discussed with patient certain preventive protocols, quality metrics, and best practice recommendations. A written personalized care plan for preventive services as well as general preventive health recommendations were provided to patient.     [name], RN  08/10/2017    I have reviewed and agree with the above AWV documentation.   Laroy Apple, MD [facility] 08/23/2017, 10:13 AM

## 2017-08-17 ENCOUNTER — Ambulatory Visit: Payer: Medicare Other | Admitting: Family Medicine

## 2017-08-20 ENCOUNTER — Encounter: Payer: Self-pay | Admitting: Family Medicine

## 2017-08-28 ENCOUNTER — Encounter: Payer: Self-pay | Admitting: Family Medicine

## 2017-08-28 ENCOUNTER — Ambulatory Visit (INDEPENDENT_AMBULATORY_CARE_PROVIDER_SITE_OTHER): Payer: Medicare Other | Admitting: Family Medicine

## 2017-08-28 VITALS — BP 134/82 | HR 85 | Temp 97.7°F | Ht 73.0 in | Wt 202.6 lb

## 2017-08-28 DIAGNOSIS — R1084 Generalized abdominal pain: Secondary | ICD-10-CM

## 2017-08-28 DIAGNOSIS — E119 Type 2 diabetes mellitus without complications: Secondary | ICD-10-CM | POA: Diagnosis not present

## 2017-08-28 LAB — BAYER DCA HB A1C WAIVED: HB A1C: 8.6 % — AB (ref <7.0)

## 2017-08-28 MED ORDER — AMOXICILLIN-POT CLAVULANATE 875-125 MG PO TABS: 1.0000 | ORAL_TABLET | Freq: Two times a day (BID) | ORAL | 0 refills | Status: DC

## 2017-08-28 MED ORDER — SUCRALFATE 1 GM/10ML PO SUSP: 1.0000 g | Freq: Three times a day (TID) | ORAL | 0 refills | Status: DC

## 2017-08-28 NOTE — Patient Instructions (Addendum)
Great to see you!  Take all antibiotics  Try carafate as well.   We will work on a referral to see Dr. Gala Romney.    Abdominal Pain, Adult Many things can cause belly (abdominal) pain. Most times, belly pain is not dangerous. Many cases of belly pain can be watched and treated at home. Sometimes belly pain is serious, though. Your doctor will try to find the cause of your belly pain. Follow these instructions at home:  Take over-the-counter and prescription medicines only as told by your doctor. Do not take medicines that help you poop (laxatives) unless told to by your doctor.  Drink enough fluid to keep your pee (urine) clear or pale yellow.  Watch your belly pain for any changes.  Keep all follow-up visits as told by your doctor. This is important. Contact a doctor if:  Your belly pain changes or gets worse.  You are not hungry, or you lose weight without trying.  You are having trouble pooping (constipated) or have watery poop (diarrhea) for more than 2-3 days.  You have pain when you pee or poop.  Your belly pain wakes you up at night.  Your pain gets worse with meals, after eating, or with certain foods.  You are throwing up and cannot keep anything down.  You have a fever. Get help right away if:  Your pain does not go away as soon as your doctor says it should.  You cannot stop throwing up.  Your pain is only in areas of your belly, such as the right side or the left lower part of the belly.  You have bloody or black poop, or poop that looks like tar.  You have very bad pain, cramping, or bloating in your belly.  You have signs of not having enough fluid or water in your body (dehydration), such as: ? Dark pee, very little pee, or no pee. ? Cracked lips. ? Dry mouth. ? Sunken eyes. ? Sleepiness. ? Weakness. This information is not intended to replace advice given to you by your health care provider. Make sure you discuss any questions you have with your  health care provider. Document Released: 03/13/2008 Document Revised: 04/14/2016 Document Reviewed: 03/08/2016 Elsevier Interactive Patient Education  2017 [employer].

## 2017-08-28 NOTE — Progress Notes (Signed)
   HPI  Patient presents today here with abdominal pain and loose stools.  Patient explains that he has had loose stools after eating now for about 2 months.  He describes frequent episodes of crampy abdominal pain which progressed into a loose stool and then has persistent burning pain after that. Patient denies any aggravating or alleviating factors, however it does seem to be connected with food.  Patient is taking a PPI daily. He has had a colonoscopy previously with a single diverticula but inadequate bowel prep.  He denies fever, chills, sweats.  He is tolerating food and fluids like usual.  He is concerned he may have had some blood in his stool today on the last stool, however he did eat tomatoes and states that it could have been pieces of food in his stool.  There is no blood on the toilet paper or in the water, only red material in the stool.  No nausea, vomiting  Patient also states that he has been having a loose stool without knowing it in bed around 5 AM every morning for the last 2 weeks.  PMH: Smoking status noted ROS: Per HPI  Objective: BP 134/82   Pulse 85   Temp 97.7 F (36.5 C) (Oral)   Ht '6\' 1"'$  (3.557 m)   Wt 202 lb 9.6 oz (91.9 kg)   BMI 26.73 kg/m  Gen: NAD, alert, cooperative with exam HEENT: NCAT CV: RRR, good S1/S2, no murmur Resp: CTABL, no wheezes, non-labored Abd: Soft, positive bowel sounds, tenderness to palpation in every quadrant in the epigastric area, no guarding, no rebound tenderness Ext: No edema, warm Neuro: Alert and oriented, No gross deficits  Assessment and plan:  #Generalized abdominal pain Unclear etiology, however patient could have colitis (difficult to say he may have diverticulitis with a single diverticulum previously) Covering with Augmentin Considering his concern about possible blood in the stool and an episode of incontinence on several days in the morning I recommended referral to GI which was placed today. Labs  today, I doubt an acute abdominal process, possible IBS diarrheal type  Patient also has very uncontrolled diabetes and poor follow-up, I would likely recommend endocrinology referral, however A1c is pending   Orders Placed This Encounter  Procedures  . CMP14+EGFR  . CBC with Differential/Platelet  . Lipase  . Ambulatory referral to Gastroenterology    Referral Priority:   Routine    Referral Type:   Consultation    Referral Reason:   Specialty Services Required    Referred to Provider:   Daneil Dolin, MD    Number of Visits Requested:   1    Meds ordered this encounter  Medications  . sucralfate (CARAFATE) 1 GM/10ML suspension    Sig: Take 10 mLs (1 g total) by mouth 4 (four) times daily -  with meals and at bedtime.    Dispense:  420 mL    Refill:  0  . amoxicillin-clavulanate (AUGMENTIN) 875-125 MG tablet    Sig: Take 1 tablet by mouth 2 (two) times daily.    Dispense:  20 tablet    Refill:  0    Laroy Apple, MD [facility] 08/28/2017, 4:30 PM

## 2017-08-29 ENCOUNTER — Encounter: Payer: Self-pay | Admitting: Gastroenterology

## 2017-08-29 LAB — CMP14+EGFR
A/G RATIO: 2 (ref 1.2–2.2)
ALT: 13 IU/L (ref 0–44)
AST: 16 IU/L (ref 0–40)
Albumin: 4.2 g/dL (ref 3.5–5.5)
Alkaline Phosphatase: 35 IU/L — ABNORMAL LOW (ref 39–117)
BILIRUBIN TOTAL: 0.4 mg/dL (ref 0.0–1.2)
BUN/Creatinine Ratio: 13 (ref 9–20)
BUN: 17 mg/dL (ref 6–24)
CHLORIDE: 98 mmol/L (ref 96–106)
CO2: 26 mmol/L (ref 20–29)
Calcium: 9.9 mg/dL (ref 8.7–10.2)
Creatinine, Ser: 1.32 mg/dL — ABNORMAL HIGH (ref 0.76–1.27)
GFR calc non Af Amer: 62 mL/min/{1.73_m2} (ref >59)
GFR, EST AFRICAN AMERICAN: 71 mL/min/{1.73_m2} (ref >59)
Globulin, Total: 2.1 g/dL (ref 1.5–4.5)
Glucose: 257 mg/dL — ABNORMAL HIGH (ref 65–99)
POTASSIUM: 4.9 mmol/L (ref 3.5–5.2)
Sodium: 137 mmol/L (ref 134–144)
TOTAL PROTEIN: 6.3 g/dL (ref 6.0–8.5)

## 2017-08-29 LAB — CBC WITH DIFFERENTIAL/PLATELET
BASOS ABS: 0.1 10*3/uL (ref 0.0–0.2)
Basos: 1 %
EOS (ABSOLUTE): 0.1 10*3/uL (ref 0.0–0.4)
Eos: 1 %
Hematocrit: 44.7 % (ref 37.5–51.0)
Hemoglobin: 15.3 g/dL (ref 13.0–17.7)
IMMATURE GRANS (ABS): 0 10*3/uL (ref 0.0–0.1)
Immature Granulocytes: 0 %
LYMPHS ABS: 2.2 10*3/uL (ref 0.7–3.1)
LYMPHS: 33 %
MCH: 32.1 pg (ref 26.6–33.0)
MCHC: 34.2 g/dL (ref 31.5–35.7)
MCV: 94 fL (ref 79–97)
MONOS ABS: 0.6 10*3/uL (ref 0.1–0.9)
Monocytes: 9 %
NEUTROS ABS: 3.9 10*3/uL (ref 1.4–7.0)
Neutrophils: 56 %
PLATELETS: 404 10*3/uL — AB (ref 150–379)
RBC: 4.76 x10E6/uL (ref 4.14–5.80)
RDW: 14.1 % (ref 12.3–15.4)
WBC: 6.9 10*3/uL (ref 3.4–10.8)

## 2017-08-29 LAB — LIPASE: LIPASE: 19 U/L (ref 13–78)

## 2017-08-31 NOTE — Progress Notes (Signed)
lmtcb 11/23 jcs

## 2017-09-03 ENCOUNTER — Emergency Department (HOSPITAL_COMMUNITY)
Admission: EM | Admit: 2017-09-03 | Discharge: 2017-09-03 | Disposition: A | Discharge status: Home / Self Care | Payer: Medicare Other | Attending: Emergency Medicine | Admitting: Emergency Medicine

## 2017-09-03 ENCOUNTER — Encounter (HOSPITAL_COMMUNITY): Payer: Self-pay | Admitting: Emergency Medicine

## 2017-09-03 ENCOUNTER — Emergency Department (HOSPITAL_COMMUNITY): Payer: Medicare Other

## 2017-09-03 ENCOUNTER — Other Ambulatory Visit: Payer: Self-pay

## 2017-09-03 DIAGNOSIS — F17210 Nicotine dependence, cigarettes, uncomplicated: Secondary | ICD-10-CM | POA: Diagnosis not present

## 2017-09-03 DIAGNOSIS — I2510 Atherosclerotic heart disease of native coronary artery without angina pectoris: Secondary | ICD-10-CM | POA: Insufficient documentation

## 2017-09-03 DIAGNOSIS — Z79899 Other long term (current) drug therapy: Secondary | ICD-10-CM | POA: Insufficient documentation

## 2017-09-03 DIAGNOSIS — E11319 Type 2 diabetes mellitus with unspecified diabetic retinopathy without macular edema: Secondary | ICD-10-CM | POA: Insufficient documentation

## 2017-09-03 DIAGNOSIS — I10 Essential (primary) hypertension: Secondary | ICD-10-CM | POA: Insufficient documentation

## 2017-09-03 DIAGNOSIS — Z794 Long term (current) use of insulin: Secondary | ICD-10-CM | POA: Insufficient documentation

## 2017-09-03 DIAGNOSIS — R079 Chest pain, unspecified: Secondary | ICD-10-CM | POA: Diagnosis not present

## 2017-09-03 DIAGNOSIS — J449 Chronic obstructive pulmonary disease, unspecified: Secondary | ICD-10-CM | POA: Diagnosis not present

## 2017-09-03 DIAGNOSIS — R1010 Upper abdominal pain, unspecified: Secondary | ICD-10-CM | POA: Diagnosis not present

## 2017-09-03 DIAGNOSIS — R197 Diarrhea, unspecified: Secondary | ICD-10-CM | POA: Diagnosis not present

## 2017-09-03 DIAGNOSIS — J45909 Unspecified asthma, uncomplicated: Secondary | ICD-10-CM | POA: Insufficient documentation

## 2017-09-03 DIAGNOSIS — R0789 Other chest pain: Secondary | ICD-10-CM | POA: Diagnosis not present

## 2017-09-03 LAB — CBC WITH DIFFERENTIAL/PLATELET
BASOS PCT: 0 %
Basophils Absolute: 0 10*3/uL (ref 0.0–0.1)
EOS ABS: 0.1 10*3/uL (ref 0.0–0.7)
EOS PCT: 1 %
HEMATOCRIT: 43.8 % (ref 39.0–52.0)
HEMOGLOBIN: 14.3 g/dL (ref 13.0–17.0)
Lymphocytes Relative: 34 %
Lymphs Abs: 2.4 10*3/uL (ref 0.7–4.0)
MCH: 31.8 pg (ref 26.0–34.0)
MCHC: 32.6 g/dL (ref 30.0–36.0)
MCV: 97.6 fL (ref 78.0–100.0)
MONO ABS: 0.7 10*3/uL (ref 0.1–1.0)
MONOS PCT: 10 %
Neutro Abs: 3.8 10*3/uL (ref 1.7–7.7)
Neutrophils Relative %: 55 %
PLATELETS: 333 10*3/uL (ref 150–400)
RBC: 4.49 MIL/uL (ref 4.22–5.81)
RDW: 13.7 % (ref 11.5–15.5)
WBC: 6.9 10*3/uL (ref 4.0–10.5)

## 2017-09-03 LAB — COMPREHENSIVE METABOLIC PANEL
ALBUMIN: 3.7 g/dL (ref 3.5–5.0)
ALK PHOS: 33 U/L — AB (ref 38–126)
ALT: 13 U/L — ABNORMAL LOW (ref 17–63)
AST: 14 U/L — AB (ref 15–41)
Anion gap: 5 (ref 5–15)
BILIRUBIN TOTAL: 0.3 mg/dL (ref 0.3–1.2)
BUN: 15 mg/dL (ref 6–20)
CALCIUM: 8.5 mg/dL — AB (ref 8.9–10.3)
CO2: 27 mmol/L (ref 22–32)
CREATININE: 1.22 mg/dL (ref 0.61–1.24)
Chloride: 101 mmol/L (ref 101–111)
GFR calc Af Amer: >60 mL/min (ref >60)
GLUCOSE: 260 mg/dL — AB (ref 65–99)
Potassium: 4.1 mmol/L (ref 3.5–5.1)
Sodium: 133 mmol/L — ABNORMAL LOW (ref 135–145)
TOTAL PROTEIN: 6.4 g/dL — AB (ref 6.5–8.1)

## 2017-09-03 LAB — LIPASE, BLOOD: LIPASE: 25 U/L (ref 11–51)

## 2017-09-03 LAB — TROPONIN I: Troponin I: <0.03 ng/mL (ref <0.03)

## 2017-09-03 MED ORDER — GI COCKTAIL ~~LOC~~
30.0000 mL | Freq: Once | ORAL | Status: AC
  Administered 2017-09-03: 30 mL via ORAL
  Filled 2017-09-03: qty 30

## 2017-09-03 NOTE — ED Provider Notes (Signed)
[facility] EMERGENCY DEPARTMENT Provider Note   CSN: [id number] Arrival date & time: 09/03/17  1703     History   Chief Complaint Chief Complaint  Patient presents with  . Chest Pain    HPI Dustin Cruz is a 51 y.o. male.  HPI Patient presents with chest and abdominal pain.  Chest pain was today but has had the abdominal pain on and off for the last few months.  Does not necessarily come on after eating.  It is not exertional.  The pain is in his upper abdomen and goes to his chest.  No shortness of breath.  No fevers.  Previous cardiac stent and states the pain did not feel like this.  No swelling in his legs. Past Medical History:  Diagnosis Date  . Anxiety   . Asthma   . COPD (chronic obstructive pulmonary disease) ([place])   . Coronary artery disease    s/p BMS to Ramus 10/2010;  Cath 01/22/12 prox 30-40% LAD, LCx ramus w/ hazy 70-80% in-stent restenosis, EF 60% treated medically  . Coronary artery disease    s/p BMS to Ramus 10/2010;  Cath 01/22/12 prox 30-40% LAD, LCx ramus w/ hazy 70-80% in-stent restenosis, EF 60% treated medically   . Depression   . Diabetes mellitus    Type 2  . Gastroparesis 05/2013  . GERD (gastroesophageal reflux disease)   . HTN (hypertension)   . Hyperlipidemia   . Hyperlipidemia   . Major depression, chronic 08/13/2012  . Melanoma ([place]) 2007   surgery at [facility], Followed by [place]  . Myocardial infarction ([place]) 2012  . Obesity   . Tobacco abuse   . Tubular adenoma     Patient Active Problem List   Diagnosis Date Noted  . Diabetic retinopathy ([place]) 09/21/2015  . Carotid bruit 09/21/2015  . Viral gastroenteritis 06/03/2015  . Hypertriglyceridemia 05/18/2015  . Vitamin D deficiency 12/29/2013  . Nausea alone 04/24/2013  . Frequency of urination 04/14/2013  . Fever, unspecified 04/14/2013  . Hepatic steatosis 12/28/2012  . Peripheral neuropathy 11/25/2012  . Melanoma of skin, site unspecified 11/25/2012  . Hyperlipidemia   .  Depression   . Hypertension   . GERD (gastroesophageal reflux disease) 04/03/2012  . Tobacco abuse 03/14/2012  . Diabetes mellitus, type 2 ([place])   . 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 CARDIAC CATHETERIZATION     with stent  . COLONOSCOPY N/A 01/27/2013   DUK:GURKYH and colonic polyps. Tubular adenomas  . CORONARY ANGIOPLASTY WITH STENT PLACEMENT    . ESOPHAGOGASTRODUODENOSCOPY  04/24/2012   [place]-mild erosive reflux esophagitis,dilated w/1F Venia Minks, small HH, minimal chronic gastric/bulbar erosions(No H pylori)  . LEFT HEART CATHETERIZATION WITH CORONARY ANGIOGRAM N/A 01/22/2012   Procedure: LEFT HEART CATHETERIZATION WITH CORONARY ANGIOGRAM;  Surgeon: Jolaine Artist, MD;  Location: [facility] CATH LAB;  Service: Cardiovascular;  Laterality: N/A;  . melanoma surgery  2007   [place], removed lymph nodes under arm as well,Left abd       Home Medications    Prior to Admission medications   Medication Sig Start Date End Date Taking? Authorizing Provider  albuterol (PROVENTIL HFA;VENTOLIN HFA) 108 (90 Base) MCG/ACT inhaler Inhale 2 puffs into the lungs every 6 (six) hours as needed for wheezing. 01/15/17  Yes Timmothy Euler, MD  albuterol (PROVENTIL) (2.5 MG/3ML) 0.083% nebulizer solution USE ONE  IN NEBULIZER TWICE DAILY AS NEEDED FOR WHEEZING OR SHORTNESS OF BREATH 09/10/14  Yes  Lysbeth Penner, FNP  amoxicillin-clavulanate (AUGMENTIN) 875-125 MG tablet Take 1 tablet by mouth 2 (two) times daily. 08/28/17  Yes Timmothy Euler, MD  aspirin EC 81 MG tablet Take 81 mg by mouth daily.   Yes [provider]  atorvastatin (LIPITOR) 80 MG tablet TAKE 1 TABLET BY MOUTH ONCE DAILY 08/09/17  Yes Timmothy Euler, MD  fenofibrate micronized (ANTARA) 130 MG capsule TAKE 1 CAPSULE BY MOUTH ONCE DAILY BEFORE BREAKFAST 07/11/17  Yes Timmothy Euler, MD  gabapentin (NEURONTIN) 800 MG tablet Take 1 tablet (800 mg  total) by mouth 3 (three) times daily. 04/26/17  Yes Wardell Honour, MD  insulin aspart (NOVOLOG FLEXPEN) 100 UNIT/ML FlexPen Inject up to 10 units prior to meal per sliding scale. Patient taking differently: Inject 1-10 Units into the skin 3 (three) times daily with meals. Inject up to 10 units prior to meal per sliding scale. 01/15/17  Yes Timmothy Euler, MD  Insulin Glargine (LANTUS) 100 UNIT/ML Solostar Pen Inject 35 Units into the skin at bedtime. 07/16/17  Yes Timmothy Euler, MD  JENTADUETO 2.02-999 MG TABS TAKE 1 TABLET BY MOUTH TWICE DAILY WITH MEALS 08/09/17  Yes Timmothy Euler, MD  nitroGLYCERIN (NITROSTAT) 0.4 MG SL tablet Place 1 tablet (0.4 mg total) under the tongue every 5 (five) minutes as needed. For chest pain. 08/13/12  Yes Reece Packer, NP  omeprazole (PRILOSEC) 40 MG capsule TAKE ONE CAPSULE BY MOUTH TWICE DAILY 04/20/17  Yes Timmothy Euler, MD  sertraline (ZOLOFT) 100 MG tablet TAKE 1 TABLET BY MOUTH ONCE DAILY 07/20/17  Yes Timmothy Euler, MD  traZODone (DESYREL) 150 MG tablet TAKE 1 TABLET BY MOUTH AT BEDTIME 08/09/17  Yes Timmothy Euler, MD  Continuous Blood Gluc Receiver (FREESTYLE LIBRE 14 DAY READER) DEVI 1 Device by Does not apply route 4 (four) times daily as needed. 08/08/17   Timmothy Euler, MD  Continuous Blood Gluc Sensor (FREESTYLE LIBRE 14 DAY SENSOR) MISC 1 Device by Does not apply route every 14 (fourteen) days. 08/08/17   Timmothy Euler, MD  sucralfate (CARAFATE) 1 GM/10ML suspension Take 10 mLs (1 g total) by mouth 4 (four) times daily -  with meals and at bedtime. Patient not taking: Reported on 09/03/2017 08/28/17   Timmothy Euler, MD    Family History Family History  Problem Relation Age of Onset  . Stroke Mother   . Alcohol abuse Father   . Lung cancer Unknown   . Heart disease Unknown   . Other Unknown        not real familiar with family history  . Colon cancer Neg Hx   . Colon polyps Neg Hx     Social  History Social History   Tobacco Use  . Smoking status: Heavy Tobacco Smoker    Packs/day: 1.00    Years: 30.00    [place] years: 30.00    Types: Cigarettes    Start date: 10/09/1981  . Smokeless tobacco: Never Used  Substance Use Topics  . 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  . Drug use: No     Allergies   Patient has no known allergies.   Review of Systems Review of Systems  Constitutional: Negative for diaphoresis.  HENT: Negative for congestion.   Respiratory: Negative for shortness of breath.   Cardiovascular: Positive  for chest pain.  Gastrointestinal: Positive for abdominal pain. Negative for nausea and rectal pain.  Genitourinary: Negative for flank pain.  Musculoskeletal: Negative for back pain.  Skin: Negative for rash.  Neurological: Negative for seizures.  Hematological: Negative for adenopathy.  Psychiatric/Behavioral: Negative for confusion.     Physical Exam Updated Vital Signs BP (!) 151/97   Pulse 72   Temp 97.9 F (36.6 C)   Resp 18   Ht 6\' 2"  (1.88 m)   Wt 91.6 kg (202 lb)   SpO2 97%   BMI 25.94 kg/m   Physical Exam  Constitutional: He appears well-developed.  HENT:  Head: Normocephalic.  Eyes: Pupils are equal, round, and reactive to light.  Neck: Neck supple.  Cardiovascular: Normal rate and regular rhythm.  No murmur heard. Pulmonary/Chest: He has no wheezes. He has no rhonchi. He has no rales.  Tenderness on the lower anterior chest wall.  Abdominal:  Tenderness on epigastric and left and right upper quadrant tenderness without rebound or guarding.  Musculoskeletal:       Right lower leg: He exhibits no edema.       Left lower leg: He exhibits no edema.  Neurological: He is alert.  Skin: Skin is warm. Capillary refill takes less than 2 seconds.  Psychiatric: He has a normal mood and affect.     ED Treatments /  Results  Labs (all labs ordered are listed, but only abnormal results are displayed) Labs Reviewed  COMPREHENSIVE METABOLIC PANEL - Abnormal; Notable for the following components:      Result Value   Sodium 133 (*)    Glucose, Bld 260 (*)    Calcium 8.5 (*)    Total Protein 6.4 (*)    AST 14 (*)    ALT 13 (*)    Alkaline Phosphatase 33 (*)    All other components within normal limits  LIPASE, BLOOD  CBC WITH DIFFERENTIAL/PLATELET  TROPONIN I    EKG  EKG Interpretation  Date/Time:  Monday September 03 2017 17:06:15 EST Ventricular Rate:  85 PR Interval:    QRS Duration: 141 QT Interval:  380 QTC Calculation: 452 R Axis:   51 Text Interpretation:  Sinus rhythm Prolonged PR interval Nonspecific intraventricular conduction delay Probable anterolateral infarct, age indeterm No significant change since last tracing Confirmed by Davonna Belling [phone]) on 09/03/2017 5:18:57 PM       Radiology Dg Chest 2 View  Result Date: 09/03/2017 CLINICAL DATA:  Chest pain today EXAM: CHEST  2 VIEW COMPARISON:  02/14/2017 FINDINGS: Normal heart size. Lungs clear. No pneumothorax. No pleural effusion. IMPRESSION: No active cardiopulmonary disease. Electronically Signed   By: Marybelle Killings M.D.   On: 09/03/2017 18:00    Procedures Procedures (including critical care time)  Medications Ordered in ED Medications  gi cocktail (Maalox,Lidocaine,Donnatal) (30 mLs Oral Given 09/03/17 1748)     Initial Impression / Assessment and Plan / ED Course  I have reviewed the triage vital signs and the nursing notes.  Pertinent labs & imaging results that were available during my care of the patient were reviewed by me and considered in my medical decision making (see chart for details).     Patient with chest abdominal pain.  EKG and lab work reassuring.  Feels somewhat better after GI cocktail.  Not exertional.  Does not feel like previous angina.  Lab work reassuring.  X-ray reassuring.  Feels  better will discharge home to follow-up with his primary care doctor.  Final Clinical  Impressions(s) / ED Diagnoses   Final diagnoses:  Nonspecific chest pain    ED Discharge Orders    None       Davonna Belling, MD 09/03/17 2139

## 2017-09-03 NOTE — ED Triage Notes (Signed)
Pt c/o cp today. Pt also reports abd pain and n/d intermittently for months.

## 2017-09-07 ENCOUNTER — Other Ambulatory Visit: Payer: Self-pay | Admitting: Family Medicine

## 2017-09-07 DIAGNOSIS — E785 Hyperlipidemia, unspecified: Secondary | ICD-10-CM

## 2017-09-14 ENCOUNTER — Ambulatory Visit: Payer: Medicare Other | Admitting: Internal Medicine

## 2017-09-17 ENCOUNTER — Other Ambulatory Visit: Payer: Self-pay | Admitting: Family Medicine

## 2017-09-21 ENCOUNTER — Encounter: Payer: Self-pay | Admitting: Family Medicine

## 2017-09-21 ENCOUNTER — Ambulatory Visit (INDEPENDENT_AMBULATORY_CARE_PROVIDER_SITE_OTHER): Payer: Medicare Other | Admitting: Family Medicine

## 2017-09-21 VITALS — BP 138/83 | HR 86 | Temp 96.4°F | Ht 74.0 in | Wt 212.0 lb

## 2017-09-21 DIAGNOSIS — Z720 Tobacco use: Secondary | ICD-10-CM

## 2017-09-21 DIAGNOSIS — F329 Major depressive disorder, single episode, unspecified: Secondary | ICD-10-CM | POA: Diagnosis not present

## 2017-09-21 DIAGNOSIS — Z794 Long term (current) use of insulin: Secondary | ICD-10-CM | POA: Diagnosis not present

## 2017-09-21 DIAGNOSIS — G63 Polyneuropathy in diseases classified elsewhere: Secondary | ICD-10-CM

## 2017-09-21 DIAGNOSIS — E1140 Type 2 diabetes mellitus with diabetic neuropathy, unspecified: Secondary | ICD-10-CM

## 2017-09-21 DIAGNOSIS — F32A Depression, unspecified: Secondary | ICD-10-CM

## 2017-09-21 MED ORDER — LINAGLIPTIN-METFORMIN HCL 2.5-1000 MG PO TABS: 1.0000 | ORAL_TABLET | Freq: Two times a day (BID) | ORAL | 3 refills | Status: DC

## 2017-09-21 MED ORDER — INSULIN GLARGINE 100 UNIT/ML SOLOSTAR PEN: 40.0000 [IU] | PEN_INJECTOR | Freq: Every day | SUBCUTANEOUS | 2 refills | Status: DC

## 2017-09-21 MED ORDER — INSULIN ASPART 100 UNIT/ML FLEXPEN: 10.0000 [IU] | PEN_INJECTOR | Freq: Three times a day (TID) | SUBCUTANEOUS | 2 refills | Status: DC

## 2017-09-21 MED ORDER — GABAPENTIN 800 MG PO TABS: 800.0000 mg | ORAL_TABLET | Freq: Three times a day (TID) | ORAL | 11 refills | Status: DC

## 2017-09-21 MED ORDER — BUSPIRONE HCL 7.5 MG PO TABS: 7.5000 mg | ORAL_TABLET | Freq: Two times a day (BID) | ORAL | 3 refills | Status: DC

## 2017-09-21 MED ORDER — SERTRALINE HCL 100 MG PO TABS: 100.0000 mg | ORAL_TABLET | Freq: Every day | ORAL | 3 refills | Status: DC

## 2017-09-21 NOTE — Patient Instructions (Signed)
Great to see you!  Change Lantus to 40 units daily.   Add buspar twice daily to zoloft.   Come back in 1  Month to follow up for anxiety.

## 2017-09-21 NOTE — Progress Notes (Signed)
   HPI  Patient presents today here for follow-up of chronic medical conditions.  Depression Patient wants to stop Zoloft and start Xanax instead.  He is tried these previously after a death in the family. He is willing to try alternative medications. No SI. Depression feels well controlled but has breakthrough anxiety.  Type 2 diabetes Patient taking a range of 35/40 units of Lantus every morning. Average fasting blood sugars 150-200. No hypoglycemia, no blood sugars less than 100. Average postprandials around 200. Patient taking 10 units of NovoLog every meal. Also taking linagliptin and metformin.    neuropathy Needs refill gabapentin, diabetic neuropathy-helped well by gabapentin.   PMH: Smoking status noted ROS: Per HPI  Objective: BP 138/83   Pulse 86   Temp (!) 96.4 F (35.8 C) (Oral)   Ht 6\' 2"  (1.88 m)   Wt 212 lb (96.2 kg)   BMI 27.22 kg/m  Gen: NAD, alert, cooperative with exam HEENT: NCAT CV: RRR, good S1/S2, no murmur Resp: CTABL, no wheezes, non-labored Ext: No edema, warm Neuro: Alert and oriented, No gross deficits  Assessment and plan:  #Type 2 diabetes Uncontrolled with A1c of 8.6, titrate Lantus slightly to control fasting blood sugars better Increased from 35-40 units daily, he was already taking this intermittently. Continue NovoLog, continue linagliptin and metformin.   #Depression, anxiety Patient complaining of more anxiety, requesting Xanax, given BuSpar. Continue Zoloft  #Neuropathy Refill gabapentin, stable  #Tobacco abuse Recommended cessation, he will consider   Meds ordered this encounter  Medications  . gabapentin (NEURONTIN) 800 MG tablet    Sig: Take 1 tablet (800 mg total) by mouth 3 (three) times daily.    Dispense:  90 tablet    Refill:  11  . Linagliptin-Metformin HCl (JENTADUETO) 2.02-999 MG TABS    Sig: Take 1 tablet by mouth 2 (two) times daily with a meal.    Dispense:  180 tablet    Refill:  3    Please  consider 90 day supplies to promote better adherence  . sertraline (ZOLOFT) 100 MG tablet    Sig: Take 1 tablet (100 mg total) by mouth daily.    Dispense:  90 tablet    Refill:  3  . busPIRone (BUSPAR) 7.5 MG tablet    Sig: Take 1 tablet (7.5 mg total) by mouth 2 (two) times daily.    Dispense:  60 tablet    Refill:  3  . insulin aspart (NOVOLOG FLEXPEN) 100 UNIT/ML FlexPen    Sig: Inject 10 Units into the skin 3 (three) times daily with meals. Inject up to 10 units prior to meal per sliding scale.    Dispense:  15 mL    Refill:  2  . Insulin Glargine (LANTUS) 100 UNIT/ML Solostar Pen    Sig: Inject 40 Units into the skin at bedtime.    Dispense:  30 mL    Refill:  2    Starting with 30 and tiotrating back up to likely 60 units daily.    Laroy Apple, MD [facility] 09/21/2017, 11:14 AM

## 2017-09-23 ENCOUNTER — Other Ambulatory Visit: Payer: Self-pay | Admitting: Family Medicine

## 2017-09-23 DIAGNOSIS — E785 Hyperlipidemia, unspecified: Secondary | ICD-10-CM

## 2017-09-26 ENCOUNTER — Other Ambulatory Visit: Payer: Self-pay | Admitting: *Deleted

## 2017-09-26 DIAGNOSIS — E785 Hyperlipidemia, unspecified: Secondary | ICD-10-CM

## 2017-09-26 MED ORDER — ATORVASTATIN CALCIUM 80 MG PO TABS: 80.0000 mg | ORAL_TABLET | Freq: Every day | ORAL | 1 refills | Status: DC

## 2017-10-07 ENCOUNTER — Emergency Department (HOSPITAL_COMMUNITY): Payer: Medicare Other

## 2017-10-07 ENCOUNTER — Encounter (HOSPITAL_COMMUNITY): Payer: Self-pay

## 2017-10-07 ENCOUNTER — Other Ambulatory Visit: Payer: Self-pay

## 2017-10-07 ENCOUNTER — Emergency Department (HOSPITAL_COMMUNITY)
Admission: EM | Admit: 2017-10-07 | Discharge: 2017-10-07 | Disposition: A | Discharge status: Home / Self Care | Payer: Medicare Other | Attending: Emergency Medicine | Admitting: Emergency Medicine

## 2017-10-07 DIAGNOSIS — R339 Retention of urine, unspecified: Secondary | ICD-10-CM

## 2017-10-07 DIAGNOSIS — Z794 Long term (current) use of insulin: Secondary | ICD-10-CM | POA: Diagnosis not present

## 2017-10-07 DIAGNOSIS — R609 Edema, unspecified: Secondary | ICD-10-CM | POA: Diagnosis not present

## 2017-10-07 DIAGNOSIS — R0602 Shortness of breath: Secondary | ICD-10-CM | POA: Diagnosis not present

## 2017-10-07 DIAGNOSIS — I2510 Atherosclerotic heart disease of native coronary artery without angina pectoris: Secondary | ICD-10-CM | POA: Diagnosis not present

## 2017-10-07 DIAGNOSIS — N472 Paraphimosis: Secondary | ICD-10-CM | POA: Insufficient documentation

## 2017-10-07 DIAGNOSIS — Z955 Presence of coronary angioplasty implant and graft: Secondary | ICD-10-CM | POA: Insufficient documentation

## 2017-10-07 DIAGNOSIS — F17210 Nicotine dependence, cigarettes, uncomplicated: Secondary | ICD-10-CM | POA: Diagnosis not present

## 2017-10-07 DIAGNOSIS — R600 Localized edema: Secondary | ICD-10-CM | POA: Diagnosis not present

## 2017-10-07 DIAGNOSIS — J45909 Unspecified asthma, uncomplicated: Secondary | ICD-10-CM | POA: Insufficient documentation

## 2017-10-07 DIAGNOSIS — J449 Chronic obstructive pulmonary disease, unspecified: Secondary | ICD-10-CM | POA: Insufficient documentation

## 2017-10-07 DIAGNOSIS — E1140 Type 2 diabetes mellitus with diabetic neuropathy, unspecified: Secondary | ICD-10-CM | POA: Insufficient documentation

## 2017-10-07 DIAGNOSIS — E11319 Type 2 diabetes mellitus with unspecified diabetic retinopathy without macular edema: Secondary | ICD-10-CM | POA: Diagnosis not present

## 2017-10-07 DIAGNOSIS — I10 Essential (primary) hypertension: Secondary | ICD-10-CM | POA: Insufficient documentation

## 2017-10-07 LAB — COMPREHENSIVE METABOLIC PANEL
ALBUMIN: 3.7 g/dL (ref 3.5–5.0)
ALT: 14 U/L — AB (ref 17–63)
AST: 22 U/L (ref 15–41)
Alkaline Phosphatase: 27 U/L — ABNORMAL LOW (ref 38–126)
Anion gap: 13 (ref 5–15)
BUN: 18 mg/dL (ref 6–20)
CHLORIDE: 101 mmol/L (ref 101–111)
CO2: 22 mmol/L (ref 22–32)
CREATININE: 1.12 mg/dL (ref 0.61–1.24)
Calcium: 9.1 mg/dL (ref 8.9–10.3)
GFR calc Af Amer: >60 mL/min (ref >60)
GLUCOSE: 226 mg/dL — AB (ref 65–99)
POTASSIUM: 3.8 mmol/L (ref 3.5–5.1)
SODIUM: 136 mmol/L (ref 135–145)
Total Bilirubin: 0.5 mg/dL (ref 0.3–1.2)
Total Protein: 6.9 g/dL (ref 6.5–8.1)

## 2017-10-07 LAB — URINALYSIS, ROUTINE W REFLEX MICROSCOPIC
BILIRUBIN URINE: NEGATIVE
Glucose, UA: 150 mg/dL — AB
KETONES UR: NEGATIVE mg/dL
LEUKOCYTES UA: NEGATIVE
NITRITE: NEGATIVE
Protein, ur: 100 mg/dL — AB
SPECIFIC GRAVITY, URINE: 1.006 (ref 1.005–1.030)
SQUAMOUS EPITHELIAL / LPF: NONE SEEN
pH: 5 (ref 5.0–8.0)

## 2017-10-07 LAB — CBC WITH DIFFERENTIAL/PLATELET
BASOS ABS: 0.1 10*3/uL (ref 0.0–0.1)
BASOS PCT: 1 %
EOS ABS: 0.1 10*3/uL (ref 0.0–0.7)
EOS PCT: 1 %
HCT: 42.8 % (ref 39.0–52.0)
Hemoglobin: 13.9 g/dL (ref 13.0–17.0)
LYMPHS PCT: 17 %
Lymphs Abs: 1.7 10*3/uL (ref 0.7–4.0)
MCH: 31.6 pg (ref 26.0–34.0)
MCHC: 32.5 g/dL (ref 30.0–36.0)
MCV: 97.3 fL (ref 78.0–100.0)
MONO ABS: 1 10*3/uL (ref 0.1–1.0)
Monocytes Relative: 10 %
Neutro Abs: 7 10*3/uL (ref 1.7–7.7)
Neutrophils Relative %: 71 %
PLATELETS: 415 10*3/uL — AB (ref 150–400)
RBC: 4.4 MIL/uL (ref 4.22–5.81)
RDW: 13.9 % (ref 11.5–15.5)
WBC: 9.8 10*3/uL (ref 4.0–10.5)

## 2017-10-07 LAB — BRAIN NATRIURETIC PEPTIDE: B NATRIURETIC PEPTIDE 5: 230 pg/mL — AB (ref 0.0–100.0)

## 2017-10-07 LAB — TROPONIN I: Troponin I: <0.03 ng/mL (ref <0.03)

## 2017-10-07 MED ORDER — FUROSEMIDE 20 MG PO TABS: 20.0000 mg | ORAL_TABLET | Freq: Every day | ORAL | 0 refills | Status: DC

## 2017-10-07 MED ORDER — POTASSIUM CHLORIDE ER 10 MEQ PO TBCR: 10.0000 meq | EXTENDED_RELEASE_TABLET | Freq: Every day | ORAL | 0 refills | Status: DC

## 2017-10-07 NOTE — ED Provider Notes (Signed)
[facility] EMERGENCY DEPARTMENT Provider Note   CSN: [id number] Arrival date & time: 10/07/17  1236     History   Chief Complaint Chief Complaint  Patient presents with  . Shortness of Breath  . Urinary Retention    HPI Dustin Cruz is a 51 y.o. male.  HPI Patient states he has had difficulty urinating since yesterday.  States he has the urge to urinate but is unable to.  He also complains of several days of worsening bilateral lower extremity swelling worse on the left.  He has had gradual development of shortness of breath which is worse when lying flat and on exertion.  No chest pain.  No fever or chills. Past Medical History:  Diagnosis Date  . Anxiety   . Asthma   . COPD (chronic obstructive pulmonary disease) ([place])   . Coronary artery disease    s/p BMS to Ramus 10/2010;  Cath 01/22/12 prox 30-40% LAD, LCx ramus w/ hazy 70-80% in-stent restenosis, EF 60% treated medically  . Coronary artery disease    s/p BMS to Ramus 10/2010;  Cath 01/22/12 prox 30-40% LAD, LCx ramus w/ hazy 70-80% in-stent restenosis, EF 60% treated medically   . Depression   . Diabetes mellitus    Type 2  . Gastroparesis 05/2013  . GERD (gastroesophageal reflux disease)   . HTN (hypertension)   . Hyperlipidemia   . Hyperlipidemia   . Major depression, chronic 08/13/2012  . Melanoma ([place]) 2007   surgery at [facility], Followed by [facility]  . Myocardial infarction ([place]) 2012  . Obesity   . Tobacco abuse   . Tubular adenoma     Patient Active Problem List   Diagnosis Date Noted  . Diabetic retinopathy ([place]) 09/21/2015  . Carotid bruit 09/21/2015  . Viral gastroenteritis 06/03/2015  . Hypertriglyceridemia 05/18/2015  . Vitamin D deficiency 12/29/2013  . Nausea alone 04/24/2013  . Frequency of urination 04/14/2013  . Fever, unspecified 04/14/2013  . Hepatic steatosis 12/28/2012  . Peripheral neuropathy 11/25/2012  . Melanoma of skin, site unspecified 11/25/2012  . Hyperlipidemia   .  Depression   . Hypertension   . GERD (gastroesophageal reflux disease) 04/03/2012  . Tobacco abuse 03/14/2012  . Diabetes mellitus, type 2 ([place])   . 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 CARDIAC CATHETERIZATION     with stent  . COLONOSCOPY N/A 01/27/2013   HCW:CBJSEG and colonic polyps. Tubular adenomas  . CORONARY ANGIOPLASTY WITH STENT PLACEMENT    . ESOPHAGOGASTRODUODENOSCOPY  04/24/2012   [place]k-mild erosive reflux esophagitis,dilated w/98F Venia Minks, small HH, minimal chronic gastric/bulbar erosions(No H pylori)  . LEFT HEART CATHETERIZATION WITH CORONARY ANGIOGRAM N/A 01/22/2012   Procedure: LEFT HEART CATHETERIZATION WITH CORONARY ANGIOGRAM;  Surgeon: Jolaine Artist, MD;  Location: [facility] CATH LAB;  Service: Cardiovascular;  Laterality: N/A;  . melanoma surgery  2007   [place], removed lymph nodes under arm as well,Left abd       Home Medications    Prior to Admission medications   Medication Sig Start Date End Date Taking? Authorizing Provider  albuterol (PROVENTIL HFA;VENTOLIN HFA) 108 (90 Base) MCG/ACT inhaler Inhale 2 puffs into the lungs every 6 (six) hours as needed for wheezing. 01/15/17  Yes Timmothy Euler, MD  albuterol (PROVENTIL) (2.5 MG/3ML) 0.083% nebulizer solution USE ONE  IN NEBULIZER TWICE DAILY AS NEEDED FOR WHEEZING OR SHORTNESS OF BREATH 09/10/14  Yes Lysbeth Penner, FNP  aspirin EC 81  MG tablet Take 81 mg by mouth daily.   Yes [provider]  atorvastatin (LIPITOR) 80 MG tablet Take 1 tablet (80 mg total) by mouth daily. 09/26/17  Yes Timmothy Euler, MD  busPIRone (BUSPAR) 7.5 MG tablet Take 1 tablet (7.5 mg total) by mouth 2 (two) times daily. 09/21/17  Yes Timmothy Euler, MD  Continuous Blood Gluc Receiver (FREESTYLE LIBRE 14 DAY READER) DEVI 1 Device by Does not apply route 4 (four) times daily as needed. 08/08/17  Yes Timmothy Euler, MD  Continuous Blood  Gluc Sensor (FREESTYLE LIBRE 14 DAY SENSOR) MISC 1 Device by Does not apply route every 14 (fourteen) days. 08/08/17  Yes Timmothy Euler, MD  fenofibrate micronized (ANTARA) 130 MG capsule TAKE 1 CAPSULE BY MOUTH ONCE DAILY BEFORE BREAKFAST 07/11/17  Yes Timmothy Euler, MD  gabapentin (NEURONTIN) 800 MG tablet Take 1 tablet (800 mg total) by mouth 3 (three) times daily. 09/21/17  Yes Timmothy Euler, MD  insulin aspart (NOVOLOG FLEXPEN) 100 UNIT/ML FlexPen Inject 10 Units into the skin 3 (three) times daily with meals. Inject up to 10 units prior to meal per sliding scale. 09/21/17  Yes Timmothy Euler, MD  Insulin Glargine (LANTUS) 100 UNIT/ML Solostar Pen Inject 40 Units into the skin at bedtime. 09/21/17  Yes Timmothy Euler, MD  Linagliptin-Metformin HCl (JENTADUETO) 2.02-999 MG TABS Take 1 tablet by mouth 2 (two) times daily with a meal. 09/21/17  Yes Timmothy Euler, MD  nitroGLYCERIN (NITROSTAT) 0.4 MG SL tablet Place 1 tablet (0.4 mg total) under the tongue every 5 (five) minutes as needed. For chest pain. 08/13/12  Yes Reece Packer, NP  omeprazole (PRILOSEC) 40 MG capsule TAKE ONE CAPSULE BY MOUTH TWICE DAILY 04/20/17  Yes Timmothy Euler, MD  sertraline (ZOLOFT) 100 MG tablet Take 1 tablet (100 mg total) by mouth daily. 09/21/17  Yes Timmothy Euler, MD  sucralfate (CARAFATE) 1 GM/10ML suspension Take 10 mLs (1 g total) by mouth 4 (four) times daily -  with meals and at bedtime. 08/28/17  Yes Timmothy Euler, MD  traZODone (DESYREL) 150 MG tablet TAKE 1 TABLET BY MOUTH AT BEDTIME 08/09/17  Yes Timmothy Euler, MD  furosemide (LASIX) 20 MG tablet Take 1 tablet (20 mg total) by mouth daily. 10/07/17   Julianne Rice, MD  potassium chloride (K-DUR) 10 MEQ tablet Take 1 tablet (10 mEq total) by mouth daily. 10/07/17   Julianne Rice, MD    Family History Family History  Problem Relation Age of Onset  . Stroke Mother   . Alcohol abuse Father   . Lung  cancer Unknown   . Heart disease Unknown   . Other Unknown        not real familiar with family history  . Colon cancer Neg Hx   . Colon polyps Neg Hx     Social History Social History   Tobacco Use  . Smoking status: Heavy Tobacco Smoker    Packs/day: 1.00    Years: 30.00    Saulter years: 30.00    Types: Cigarettes    Start date: 10/09/1981  . Smokeless tobacco: Never Used  Substance Use Topics  . 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  . Drug use: No     Allergies   Patient has no known allergies.   Review  of Systems Review of Systems  Constitutional: Negative for chills and fever.  Respiratory: Positive for shortness of breath. Negative for cough and wheezing.   Cardiovascular: Positive for leg swelling. Negative for chest pain and palpitations.  Gastrointestinal: Positive for abdominal distention and abdominal pain. Negative for diarrhea, nausea and vomiting.  Genitourinary: Positive for difficulty urinating. Negative for dysuria, flank pain, frequency and hematuria.  Musculoskeletal: Negative for back pain, myalgias, neck pain and neck stiffness.  Skin: Negative for rash and wound.  Neurological: Negative for dizziness, weakness, light-headedness, numbness and headaches.  All other systems reviewed and are negative.    Physical Exam Updated Vital Signs BP 140/83   Pulse 92   Temp 97.9 F (36.6 C) (Oral)   Resp 17   Ht 6\' 2"  (1.88 m)   Wt 97.8 kg (215 lb 9.8 oz)   SpO2 97%   BMI 27.68 kg/m   Physical Exam  Constitutional: He is oriented to person, place, and time. He appears well-developed and well-nourished. No distress.  HENT:  Head: Normocephalic and atraumatic.  Mouth/Throat: Oropharynx is clear and moist.  Eyes: EOM are normal. Pupils are equal, round, and reactive to light.  Injected sclera bilaterally  Neck: Normal range of motion.  Neck supple. No JVD present.  Cardiovascular: Normal rate and regular rhythm. Exam reveals no gallop and no friction rub.  No murmur heard. Pulmonary/Chest: Effort normal. He has rales.  Patient has few crackles in bilateral bases.  Abdominal: Soft. Bowel sounds are normal. There is tenderness. There is no rebound and no guarding.  Mild suprapubic tenderness and fullness to palpation.  Genitourinary:  Genitourinary Comments: Paraphimosis with engorgement of the glands.  Musculoskeletal: Normal range of motion. He exhibits edema. He exhibits no tenderness.  1+ bilateral lower extremity pitting edema without calf swelling or asymmetry.  Lymphadenopathy:    He has no cervical adenopathy.  Neurological: He is alert and oriented to person, place, and time.  Moving all extremities without focal deficit.  Sensation intact.  Skin: Skin is warm and dry. No rash noted. No erythema.  Psychiatric: He has a normal mood and affect. His behavior is normal.  Nursing note and vitals reviewed.    ED Treatments / Results  Labs (all labs ordered are listed, but only abnormal results are displayed) Labs Reviewed  CBC WITH DIFFERENTIAL/PLATELET - Abnormal; Notable for the following components:      Result Value   Platelets 415 (*)    All other components within normal limits  COMPREHENSIVE METABOLIC PANEL - Abnormal; Notable for the following components:   Glucose, Bld 226 (*)    ALT 14 (*)    Alkaline Phosphatase 27 (*)    All other components within normal limits  BRAIN NATRIURETIC PEPTIDE - Abnormal; Notable for the following components:   B Natriuretic Peptide 230.0 (*)    All other components within normal limits  URINALYSIS, ROUTINE W REFLEX MICROSCOPIC - Abnormal; Notable for the following components:   Glucose, UA 150 (*)    Hgb urine dipstick SMALL (*)    Protein, ur 100 (*)    Bacteria, UA RARE (*)    All other components within normal limits  TROPONIN I    EKG  EKG  Interpretation  Date/Time:  Sunday October 07 2017 12:55:59 EST Ventricular Rate:  96 PR Interval:    QRS Duration: 145 QT Interval:  381 QTC Calculation: 482 R Axis:   48 Text Interpretation:  Sinus rhythm Borderline prolonged PR interval Left bundle  branch block Confirmed by Julianne Rice [phone]) on 10/07/2017 4:29:13 PM       Radiology Dg Chest 2 View  Result Date: 10/07/2017 CLINICAL DATA:  Shortness of Breath EXAM: CHEST  2 VIEW COMPARISON:  09/03/2017 FINDINGS: Cardiac shadow is within normal limits. The lungs are well aerated bilaterally. No focal infiltrate or sizable effusion is seen. No acute bony abnormality is noted. IMPRESSION: No active cardiopulmonary disease. Electronically Signed   By: Inez Catalina M.D.   On: 10/07/2017 13:53    Procedures Procedures (including critical care time)  Medications Ordered in ED Medications - No data to display   Initial Impression / Assessment and Plan / ED Course  I have reviewed the triage vital signs and the nursing notes.  Pertinent labs & imaging results that were available during my care of the patient were reviewed by me and considered in my medical decision making (see chart for details).     RN staff able to reduce paraphimosis after application of ice.  Foley catheter had been placed. Will start on low-dose Lasix and potassium replacement for generalized peripheral edema.  Patient's been encouraged to follow-up closely with a urologist for elective circumcision.  Return precautions have been given Final Clinical Impressions(s) / ED Diagnoses   Final diagnoses:  Paraphimosis  Urinary retention  Peripheral edema    ED Discharge Orders        Ordered    furosemide (LASIX) 20 MG tablet  Daily     10/07/17 1625    potassium chloride (K-DUR) 10 MEQ tablet  Daily     10/07/17 1625       Julianne Rice, MD 10/07/17 [phone]

## 2017-10-07 NOTE — ED Triage Notes (Addendum)
Reports of lower leg swelling/abdominal swelling that started yesterday. States he woke up this morning feeling shortness of breath. Last void was last night.

## 2017-10-07 NOTE — ED Notes (Signed)
574ml of urine found with bladder scanner.

## 2017-10-10 ENCOUNTER — Other Ambulatory Visit: Payer: Self-pay | Admitting: Family Medicine

## 2017-10-10 NOTE — Progress Notes (Signed)
Subjective: CC: ED follow up PCP: Timmothy Euler, MD [id number] Dustin Cruz is a 52 y.o. male presenting to clinic today for:  Patient was seen in the emergency department on 12/30 for difficulty urinating.  He is found to have a paraphimosis, which was reduced with application of ice.  Urinary catheter was placed for associated urinary retention.  He was referred to urology for further evaluation and consideration for elective circumcision.  During that time, he was also noted to have lower extremity edema.  BMP was noted to be 230.  He was discharged with Lasix and potassium chloride.  Chest x-ray at that time was negative for evidence of pulmonary edema.  He follows up today and notes that he was not discharged with catheter because he refused.  He notes that this morning he was able to void very little.  He is having some lower abdominal soreness.  Denies hematuria.  He endorses occasional shortness of breath.  He reports lower extremity swelling.  He is not yet gotten his medications from the pharmacy, specifically Lasix or potassium.  He was unsure as to what these medications were for.  He has not yet schedule an appointment with urology.  He is wondering if we can do this for him.  Denies chest pain, nausea, vomiting, dizziness.  ROS: Per HPI  No Known Allergies Past Medical History:  Diagnosis Date  . Anxiety   . Asthma   . COPD (chronic obstructive pulmonary disease) ([place])   . Coronary artery disease    s/p BMS to Ramus 10/2010;  Cath 01/22/12 prox 30-40% LAD, LCx ramus w/ hazy 70-80% in-stent restenosis, EF 60% treated medically  . Coronary artery disease    s/p BMS to Ramus 10/2010;  Cath 01/22/12 prox 30-40% LAD, LCx ramus w/ hazy 70-80% in-stent restenosis, EF 60% treated medically   . Depression   . Diabetes mellitus    Type 2  . Gastroparesis 05/2013  . GERD (gastroesophageal reflux disease)   . HTN (hypertension)   . Hyperlipidemia   . Hyperlipidemia   . Major depression,  chronic 08/13/2012  . Melanoma ([place]) 2007   surgery at [facility], Followed by [facility]  . Myocardial infarction ([place]) 2012  . Obesity   . Tobacco abuse   . Tubular adenoma     Current Outpatient Medications:  .  albuterol (PROVENTIL HFA;VENTOLIN HFA) 108 (90 Base) MCG/ACT inhaler, Inhale 2 puffs into the lungs every 6 (six) hours as needed for wheezing., Disp: 1 Inhaler, Rfl: 2 .  albuterol (PROVENTIL) (2.5 MG/3ML) 0.083% nebulizer solution, USE ONE  IN NEBULIZER TWICE DAILY AS NEEDED FOR WHEEZING OR SHORTNESS OF BREATH, Disp: 75 mL, Rfl: 3 .  aspirin EC 81 MG tablet, Take 81 mg by mouth daily., Disp: , Rfl:  .  atorvastatin (LIPITOR) 80 MG tablet, Take 1 tablet (80 mg total) by mouth daily., Disp: 90 tablet, Rfl: 1 .  busPIRone (BUSPAR) 7.5 MG tablet, Take 1 tablet (7.5 mg total) by mouth 2 (two) times daily., Disp: 60 tablet, Rfl: 3 .  furosemide (LASIX) 20 MG tablet, Take 1 tablet (20 mg total) by mouth daily., Disp: 10 tablet, Rfl: 0 .  gabapentin (NEURONTIN) 800 MG tablet, Take 1 tablet (800 mg total) by mouth 3 (three) times daily., Disp: 90 tablet, Rfl: 11 .  insulin aspart (NOVOLOG FLEXPEN) 100 UNIT/ML FlexPen, Inject 10 Units into the skin 3 (three) times daily with meals. Inject up to 10 units prior to meal per sliding scale., Disp:  15 mL, Rfl: 2 .  Insulin Glargine (LANTUS) 100 UNIT/ML Solostar Pen, Inject 40 Units into the skin at bedtime., Disp: 30 mL, Rfl: 2 .  Linagliptin-Metformin HCl (JENTADUETO) 2.02-999 MG TABS, Take 1 tablet by mouth 2 (two) times daily with a meal., Disp: 180 tablet, Rfl: 3 .  nitroGLYCERIN (NITROSTAT) 0.4 MG SL tablet, Place 1 tablet (0.4 mg total) under the tongue every 5 (five) minutes as needed. For chest pain., Disp: 25 tablet, Rfl: 3 .  omeprazole (PRILOSEC) 40 MG capsule, TAKE 1 CAPSULE BY MOUTH TWICE DAILY, Disp: 180 capsule, Rfl: 0 .  potassium chloride (K-DUR) 10 MEQ tablet, Take 1 tablet (10 mEq total) by mouth daily., Disp: 10 tablet, Rfl: 0 .   sertraline (ZOLOFT) 100 MG tablet, Take 1 tablet (100 mg total) by mouth daily., Disp: 90 tablet, Rfl: 3 .  traZODone (DESYREL) 150 MG tablet, TAKE 1 TABLET BY MOUTH AT BEDTIME, Disp: 90 tablet, Rfl: 1 Social History   Socioeconomic History  . Marital status: Divorced    Spouse name: rayann Noble  . Number of children: 0  . Years of education: Not on file  . Highest education level: Not on file  Social Needs  . Financial resource strain: Not on file  . Food insecurity - worry: Not on file  . Food insecurity - inability: Not on file  . Transportation needs - medical: Not on file  . Transportation needs - non-medical: Not on file  Occupational History  . Occupation: disabled  Tobacco Use  . Smoking status: Heavy Tobacco Smoker    Packs/day: 1.00    Years: 30.00    Cousins years: 30.00    Types: Cigarettes    Start date: 10/09/1981  . Smokeless tobacco: Never Used  Substance and Sexual Activity  . 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  . Drug use: No  . Sexual activity: Yes  Other Topics Concern  . Not on file  Social History Narrative   Lives w/ wife   Family History  Problem Relation Age of Onset  . Stroke Mother   . Alcohol abuse Father   . Lung cancer Unknown   . Heart disease Unknown   . Other Unknown        not real familiar with family history  . Colon cancer Neg Hx   . Colon polyps Neg Hx     Objective: Office vital signs reviewed. BP 139/83   Pulse 96   Temp 97.6 F (36.4 C) (Oral)   Ht 6\' 2"  (1.88 m)   Wt 214 lb (97.1 kg)   BMI 27.48 kg/m   Physical Examination:  General: Awake, alert, nontoxic appearing, No acute distress Cardio: Regular rate and rhythm, no murmurs, S1-S2 heard. Pulm: Mild bibasilar crackles appreciated.  Otherwise clear to auscultation.  Normal work of breathing on room air. GU: +suprapubic TTP, bladder palpable/  distended. Ext: +1 pitting edema to mid shins bilaterally.  Assessment/ Plan: 52 y.o. male   1. Urinary retention In and out catheterization performed in office today, see separate RN note.  Patient refused to go home with a catheter.  He does have an appointment with urology scheduled for 2 PM today.  I stressed the importance of this appointment given urinary retention.  Patient notes that his transportation may be a limiting factor.  Home care instructions were reviewed with  the patient.  Red flags reviewed with the patient.  If patient unable to void independently going forward, I did recommend that he go to the emergency department for further evaluation.  He voiced understanding. - Basic Metabolic Panel - referral to urology  2. Paraphimosis Will need elective circumcision.  Today, with reports of urinary retention.  He is got tenderness over his bladder on exam.  Evidence of lower extremity fluid overload.  He has not picked up his medication and I would recommend that he does obtain the Lasix and potassium as directed.  He was referred to cardiology as below. - Ambulatory referral to Urology  3. Peripheral edema BNP was over 230 in the emergency department.  He has known arteriosclerotic cardiovascular disease.  Has not had an echo in greater than 3 years.  Physical exam remarkable for bibasilar crackles and +1 pitting edema to mid shins bilaterally.  He was prescribed Lasix and potassium in the ED but has not yet retrieve these medications.  I stressed the importance of these medications to reduce retained fluid.  He was referred back to his cardiologist in [place].  Obtain BMP given urinary retention. - Ambulatory referral to Cardiology - Basic Metabolic Panel    Orders Placed This Encounter  Procedures  . Basic Metabolic Panel  . Ambulatory referral to Cardiology    Referral Priority:   Routine    Referral Type:   Consultation    Referral Reason:   Specialty Services Required     Requested Specialty:   Cardiology    Number of Visits Requested:   1  . Ambulatory referral to Urology    Referral Priority:   Routine    Referral Type:   Consultation    Referral Reason:   Specialty Services Required    Requested Specialty:   Urology    Number of Visits Requested:   [place], [place] [phone]

## 2017-10-12 ENCOUNTER — Telehealth: Payer: Self-pay | Admitting: Family Medicine

## 2017-10-12 DIAGNOSIS — R0600 Dyspnea, unspecified: Secondary | ICD-10-CM

## 2017-10-15 ENCOUNTER — Other Ambulatory Visit: Payer: Self-pay | Admitting: Family Medicine

## 2017-10-15 ENCOUNTER — Encounter: Payer: Self-pay | Admitting: Family Medicine

## 2017-10-15 ENCOUNTER — Ambulatory Visit (INDEPENDENT_AMBULATORY_CARE_PROVIDER_SITE_OTHER): Payer: Medicare Other | Admitting: Family Medicine

## 2017-10-15 VITALS — BP 139/83 | HR 96 | Temp 97.6°F | Ht 74.0 in | Wt 214.0 lb

## 2017-10-15 DIAGNOSIS — R3912 Poor urinary stream: Secondary | ICD-10-CM | POA: Diagnosis not present

## 2017-10-15 DIAGNOSIS — R609 Edema, unspecified: Secondary | ICD-10-CM | POA: Diagnosis not present

## 2017-10-15 DIAGNOSIS — R339 Retention of urine, unspecified: Secondary | ICD-10-CM | POA: Diagnosis not present

## 2017-10-15 DIAGNOSIS — N472 Paraphimosis: Secondary | ICD-10-CM

## 2017-10-15 DIAGNOSIS — R3911 Hesitancy of micturition: Secondary | ICD-10-CM | POA: Diagnosis not present

## 2017-10-15 DIAGNOSIS — R3129 Other microscopic hematuria: Secondary | ICD-10-CM

## 2017-10-15 DIAGNOSIS — N471 Phimosis: Secondary | ICD-10-CM | POA: Diagnosis not present

## 2017-10-15 LAB — MICROSCOPIC EXAMINATION
EPITHELIAL CELLS (NON RENAL): NONE SEEN /HPF (ref 0–10)
Renal Epithel, UA: NONE SEEN /hpf

## 2017-10-15 LAB — URINALYSIS, COMPLETE
Bilirubin, UA: NEGATIVE
Ketones, UA: NEGATIVE
Leukocytes, UA: NEGATIVE
NITRITE UA: NEGATIVE
PH UA: 5.5 (ref 5.0–7.5)
Specific Gravity, UA: 1.015 (ref 1.005–1.030)
UUROB: 0.2 mg/dL (ref 0.2–1.0)

## 2017-10-15 MED ORDER — ALBUTEROL SULFATE (2.5 MG/3ML) 0.083% IN NEBU: 2.5000 mg | INHALATION_SOLUTION | Freq: Four times a day (QID) | RESPIRATORY_TRACT | 3 refills | Status: DC | PRN

## 2017-10-15 MED ORDER — GLUCOSE BLOOD VI STRP: ORAL_STRIP | 5 refills | Status: DC

## 2017-10-15 NOTE — Addendum Note (Signed)
Addended by: Torrie Mayers on: 10/15/2017 04:25 PM   Modules accepted: Orders

## 2017-10-15 NOTE — Addendum Note (Signed)
Addended by: Timmothy Euler on: 10/15/2017 12:24 PM   Modules accepted: Orders

## 2017-10-15 NOTE — Telephone Encounter (Signed)
Seen for shortness of breath and paraphimosis in the emergency room.  I have refilled his albuterol nebulizer solution and written a prescription for a nebulizer machine.  This is for shortness of breath.  Laroy Apple, MD [facility] 10/15/2017, 12:24 PM

## 2017-10-15 NOTE — Telephone Encounter (Signed)
lmtcb Need to know what type blood monitor that he has and nebulizer medication he has since we have not prescribed since 2017

## 2017-10-15 NOTE — Addendum Note (Signed)
Addended by: [name] on: 10/15/2017 12:40 PM   Modules accepted: Orders

## 2017-10-15 NOTE — Telephone Encounter (Signed)
Pt aware nebulizer medicine has been sent to pharmacy

## 2017-10-15 NOTE — Telephone Encounter (Signed)
Pt has a Chiropodist test strips to [employer] nedulizer medication sent as well but does not know what medicine this is and has not had any in awhile

## 2017-10-15 NOTE — Progress Notes (Signed)
In and Out Cath procedure completed without difficulty. Approx. 250cc of amber cloudy urine returned. Patient tolerated well and sterile specimen taken to lab.

## 2017-10-15 NOTE — Patient Instructions (Addendum)
I value your feedback and appreciate you entrusting [place] with your care.  If you get a survey, I would appreciate your taking the time to let us know what your experience was like.  You have been referred to the urologist for elective circumcision and to the cardiologist for your swelling.  Please pick up your medications. You had labs performed today.  You will be contacted with the results of the labs once they are available, usually in the next 3 days for routine lab work.   Paraphimosis Paraphimosis is a serious condition in which the fold of skin that stretches over the tip of the penis (foreskin) becomes stuck when it is pulled back. This condition blocks blood from flowing away from the penis tip, and that causes swelling that gets worse and worse. Paraphimosis needs to be treated right away. What are the causes? This condition may be caused by:  A foreskin that is tighter than normal.  Leaving the foreskin pulled back after a procedure, such as after the placement of a catheter.  A foreskin that has been pulled back for too long.  A forceful pulling back of the foreskin.  Infection under the foreskin.  Trauma to the area, such as a hard hit to the the tip of the penis.  Having sex or masturbating.  Hair or clothing that gets wrapped around the penis.  What increases the risk? This condition is more likely to develop in:  Males who have not had their foreskin removed.  Males who have frequent urological procedures.  Males who have poor hygiene.  Males who need help with daily hygiene from a caregiver.  What are the signs or symptoms? Symptoms of this condition include:  A foreskin that cannot be returned to its normal position.  Pain, often at the tip of the penis.  Swelling of the penis.  Anxiety.  Trouble urinating.  Skin that is red or bluish at the tip of the penis.  How is this diagnosed? This condition may be diagnosed with a physical exam. You may also  have X-rays. How is this treated? This condition may be treated with:  A procedure to force the foreskin back over the tip of the penis. Swelling may first be reduced with: ? Ice. ? A bandage wrapped tightly around the penis. ? Needles to drain any pus or blood that is causing the swelling.  Medicine to relieve pain. Medicine may be given by mouth, through an IV tube, or by an injection to the base of the penis (nerve block).  A procedure in which a small cut (incision) is made in the tightened foreskin to free it and allow it to be pulled back into place (dorsal slit procedure).  Surgery to remove the foreskin (circumcision). This may be done if the foreskin cannot be moved back into place.  Most of the time, treatment can be done in a clinic or a health care provider's office. Follow these instructions at home:  Take over-the-counter and prescription medicines only as told by your health care provider.  Apply any creams to the affected area only as told by your health care provider.  If the treated area is covered with gauze or a bandage (dressing), follow instructions from your health care provider about: ? When and how you should change your bandage (dressing). ? When you should remove your dressing. ? Whether the area can get wet.  Wear loose undergarments to avoid applying pressure to the area.  Follow instructions from  your health care provider about avoiding sexual activity.  Keep all follow-up visits as told by your health care provider. This is important. Contact a health care provider if:  The treated area of skin does not heal, or it becomes more irritated, red, or bloody.  Urination is difficult or painful.  Pain in the penis continues, even after you take medicine for pain. Get help right away if:  You develop a fever. This information is not intended to replace advice given to you by your health care provider. Make sure you discuss any questions you have with  your health care provider. Document Released: 07/23/2009 Document Revised: 03/02/2016 Document Reviewed: 12/21/2014 Elsevier Interactive Patient Education  [employer].

## 2017-10-15 NOTE — Addendum Note (Signed)
Addended byCarrolyn Leigh on: 10/15/2017 11:38 AM   Modules accepted: Orders

## 2017-10-16 LAB — URINE CULTURE: Organism ID, Bacteria: NO GROWTH

## 2017-10-19 ENCOUNTER — Other Ambulatory Visit: Payer: Self-pay | Admitting: *Deleted

## 2017-10-19 ENCOUNTER — Ambulatory Visit: Admit: 2017-10-19 | Payer: Medicare Other | Admitting: Urology

## 2017-10-19 SURGERY — CIRCUMCISION, ADULT
Anesthesia: General

## 2017-10-19 MED ORDER — GLUCOSE BLOOD VI STRP: ORAL_STRIP | 5 refills | Status: DC

## 2017-10-28 ENCOUNTER — Encounter (HOSPITAL_COMMUNITY): Payer: Self-pay | Admitting: Emergency Medicine

## 2017-10-28 ENCOUNTER — Emergency Department (HOSPITAL_COMMUNITY): Payer: Medicare Other

## 2017-10-28 ENCOUNTER — Emergency Department (HOSPITAL_COMMUNITY)
Admission: EM | Admit: 2017-10-28 | Discharge: 2017-10-28 | Disposition: A | Discharge status: Home / Self Care | Payer: Medicare Other | Attending: Emergency Medicine | Admitting: Emergency Medicine

## 2017-10-28 DIAGNOSIS — N400 Enlarged prostate without lower urinary tract symptoms: Secondary | ICD-10-CM | POA: Diagnosis not present

## 2017-10-28 DIAGNOSIS — Z794 Long term (current) use of insulin: Secondary | ICD-10-CM | POA: Diagnosis not present

## 2017-10-28 DIAGNOSIS — I2510 Atherosclerotic heart disease of native coronary artery without angina pectoris: Secondary | ICD-10-CM | POA: Diagnosis not present

## 2017-10-28 DIAGNOSIS — M546 Pain in thoracic spine: Secondary | ICD-10-CM | POA: Diagnosis not present

## 2017-10-28 DIAGNOSIS — Z955 Presence of coronary angioplasty implant and graft: Secondary | ICD-10-CM | POA: Diagnosis not present

## 2017-10-28 DIAGNOSIS — I10 Essential (primary) hypertension: Secondary | ICD-10-CM | POA: Diagnosis not present

## 2017-10-28 DIAGNOSIS — J449 Chronic obstructive pulmonary disease, unspecified: Secondary | ICD-10-CM | POA: Insufficient documentation

## 2017-10-28 DIAGNOSIS — Z7982 Long term (current) use of aspirin: Secondary | ICD-10-CM | POA: Diagnosis not present

## 2017-10-28 DIAGNOSIS — N3090 Cystitis, unspecified without hematuria: Secondary | ICD-10-CM | POA: Insufficient documentation

## 2017-10-28 DIAGNOSIS — Z79899 Other long term (current) drug therapy: Secondary | ICD-10-CM | POA: Diagnosis not present

## 2017-10-28 DIAGNOSIS — R52 Pain, unspecified: Secondary | ICD-10-CM | POA: Diagnosis not present

## 2017-10-28 DIAGNOSIS — E119 Type 2 diabetes mellitus without complications: Secondary | ICD-10-CM | POA: Insufficient documentation

## 2017-10-28 DIAGNOSIS — F17210 Nicotine dependence, cigarettes, uncomplicated: Secondary | ICD-10-CM | POA: Insufficient documentation

## 2017-10-28 DIAGNOSIS — J45909 Unspecified asthma, uncomplicated: Secondary | ICD-10-CM | POA: Diagnosis not present

## 2017-10-28 DIAGNOSIS — R339 Retention of urine, unspecified: Secondary | ICD-10-CM | POA: Diagnosis not present

## 2017-10-28 LAB — BASIC METABOLIC PANEL
Anion gap: 11 (ref 5–15)
BUN: 15 mg/dL (ref 6–20)
CHLORIDE: 100 mmol/L — AB (ref 101–111)
CO2: 27 mmol/L (ref 22–32)
CREATININE: 1.23 mg/dL (ref 0.61–1.24)
Calcium: 9.5 mg/dL (ref 8.9–10.3)
GFR calc Af Amer: >60 mL/min (ref >60)
GFR calc non Af Amer: >60 mL/min (ref >60)
GLUCOSE: 229 mg/dL — AB (ref 65–99)
POTASSIUM: 4.4 mmol/L (ref 3.5–5.1)
Sodium: 138 mmol/L (ref 135–145)

## 2017-10-28 LAB — URINALYSIS, ROUTINE W REFLEX MICROSCOPIC
BILIRUBIN URINE: NEGATIVE
Bacteria, UA: NONE SEEN
Glucose, UA: >=500 mg/dL — AB
Ketones, ur: NEGATIVE mg/dL
LEUKOCYTES UA: NEGATIVE
NITRITE: NEGATIVE
PH: 6 (ref 5.0–8.0)
Protein, ur: 100 mg/dL — AB
SQUAMOUS EPITHELIAL / LPF: NONE SEEN
Specific Gravity, Urine: 1.023 (ref 1.005–1.030)

## 2017-10-28 LAB — CBC WITH DIFFERENTIAL/PLATELET
Basophils Absolute: 0 10*3/uL (ref 0.0–0.1)
Basophils Relative: 1 %
EOS ABS: 0.1 10*3/uL (ref 0.0–0.7)
Eosinophils Relative: 1 %
HEMATOCRIT: 43.4 % (ref 39.0–52.0)
HEMOGLOBIN: 14.4 g/dL (ref 13.0–17.0)
LYMPHS ABS: 2.3 10*3/uL (ref 0.7–4.0)
LYMPHS PCT: 35 %
MCH: 31.6 pg (ref 26.0–34.0)
MCHC: 33.2 g/dL (ref 30.0–36.0)
MCV: 95.2 fL (ref 78.0–100.0)
MONOS PCT: 11 %
Monocytes Absolute: 0.8 10*3/uL (ref 0.1–1.0)
NEUTROS PCT: 52 %
Neutro Abs: 3.6 10*3/uL (ref 1.7–7.7)
Platelets: 362 10*3/uL (ref 150–400)
RBC: 4.56 MIL/uL (ref 4.22–5.81)
RDW: 13.3 % (ref 11.5–15.5)
WBC: 6.8 10*3/uL (ref 4.0–10.5)

## 2017-10-28 MED ORDER — IOPAMIDOL (ISOVUE-300) INJECTION 61%
100.0000 mL | Freq: Once | INTRAVENOUS | Status: AC | PRN
  Administered 2017-10-28: 100 mL via INTRAVENOUS

## 2017-10-28 MED ORDER — LEVOFLOXACIN 500 MG PO TABS
500.0000 mg | ORAL_TABLET | Freq: Once | ORAL | Status: AC
  Administered 2017-10-28: 500 mg via ORAL
  Filled 2017-10-28: qty 1

## 2017-10-28 MED ORDER — LEVOFLOXACIN 500 MG PO TABS: 500.0000 mg | ORAL_TABLET | Freq: Every day | ORAL | 0 refills | Status: DC

## 2017-10-28 NOTE — ED Provider Notes (Addendum)
[facility] EMERGENCY DEPARTMENT Provider Note   CSN: [id number] Arrival date & time: 10/28/17  1521     History   Chief Complaint Chief Complaint  Patient presents with  . Urinary Retention    HPI Dustin Cruz is a 52 y.o. male.  HPI  This is a 52 year old man who presents today stating that he has had difficulty urinating for the past 2 days.  He states he has been unable to void since last night.  He has had 2 similar episodes in the past.  He states he has been seen by [facility] and is scheduled to have a circumcision.  He denies any increased swelling.  He states he has some pain in his low back and both sides.  He denies fever, chills, or pain with urination.  Past Medical History:  Diagnosis Date  . Anxiety   . Asthma   . COPD (chronic obstructive pulmonary disease) ([place])   . Coronary artery disease    s/p BMS to Ramus 10/2010;  Cath 01/22/12 prox 30-40% LAD, LCx ramus w/ hazy 70-80% in-stent restenosis, EF 60% treated medically  . Coronary artery disease    s/p BMS to Ramus 10/2010;  Cath 01/22/12 prox 30-40% LAD, LCx ramus w/ hazy 70-80% in-stent restenosis, EF 60% treated medically   . Depression   . Diabetes mellitus    Type 2  . Gastroparesis 05/2013  . GERD (gastroesophageal reflux disease)   . HTN (hypertension)   . Hyperlipidemia   . Hyperlipidemia   . Major depression, chronic 08/13/2012  . Melanoma ([place]) 2007   surgery at [facility], Followed by [place]  . Myocardial infarction ([place]) 2012  . Obesity   . Tobacco abuse   . Tubular adenoma     Patient Active Problem List   Diagnosis Date Noted  . Diabetic retinopathy ([place]) 09/21/2015  . Carotid bruit 09/21/2015  . Viral gastroenteritis 06/03/2015  . Hypertriglyceridemia 05/18/2015  . Vitamin D deficiency 12/29/2013  . Nausea alone 04/24/2013  . Frequency of urination 04/14/2013  . Fever, unspecified 04/14/2013  . Hepatic steatosis 12/28/2012  . Peripheral neuropathy 11/25/2012  . Melanoma of  skin, site unspecified 11/25/2012  . Hyperlipidemia   . Depression   . Hypertension   . GERD (gastroesophageal reflux disease) 04/03/2012  . Tobacco abuse 03/14/2012  . Diabetes mellitus, type 2 ([place])   . 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 CARDIAC CATHETERIZATION     with stent  . COLONOSCOPY N/A 01/27/2013   ZSW:FUXNAT and colonic polyps. Tubular adenomas  . CORONARY ANGIOPLASTY WITH STENT PLACEMENT    . ESOPHAGOGASTRODUODENOSCOPY  04/24/2012   [place]-mild erosive reflux esophagitis,dilated w/22F Venia Minks, small HH, minimal chronic gastric/bulbar erosions(No H pylori)  . LEFT HEART CATHETERIZATION WITH CORONARY ANGIOGRAM N/A 01/22/2012   Procedure: LEFT HEART CATHETERIZATION WITH CORONARY ANGIOGRAM;  Surgeon: Jolaine Artist, MD;  Location: [facility] CATH LAB;  Service: Cardiovascular;  Laterality: N/A;  . melanoma surgery  2007   [place], removed lymph nodes under arm as well,Left abd       Home Medications    Prior to Admission medications   Medication Sig Start Date End Date Taking? Authorizing Provider  albuterol (PROVENTIL HFA;VENTOLIN HFA) 108 (90 Base) MCG/ACT inhaler Inhale 2 puffs into the lungs every 6 (six) hours as needed for wheezing. 01/15/17   Timmothy Euler, MD  albuterol (PROVENTIL) (2.5 MG/3ML) 0.083% nebulizer solution Take 3 mLs (2.5 mg total) by  nebulization every 6 (six) hours as needed for wheezing or shortness of breath. 10/15/17   Timmothy Euler, MD  aspirin EC 81 MG tablet Take 81 mg by mouth daily.    [provider]  atorvastatin (LIPITOR) 80 MG tablet Take 1 tablet (80 mg total) by mouth daily. 09/26/17   Timmothy Euler, MD  busPIRone (BUSPAR) 7.5 MG tablet Take 1 tablet (7.5 mg total) by mouth 2 (two) times daily. 09/21/17   Timmothy Euler, MD  furosemide (LASIX) 20 MG tablet Take 1 tablet (20 mg total) by mouth daily. 10/07/17   Julianne Rice, MD    gabapentin (NEURONTIN) 800 MG tablet Take 1 tablet (800 mg total) by mouth 3 (three) times daily. 09/21/17   Timmothy Euler, MD  glucose blood (ONE TOUCH ULTRA TEST) test strip Use to check blood sugars twice daily 10/19/17   Ronnie Doss M, DO  insulin aspart (NOVOLOG FLEXPEN) 100 UNIT/ML FlexPen Inject 10 Units into the skin 3 (three) times daily with meals. Inject up to 10 units prior to meal per sliding scale. 09/21/17   Timmothy Euler, MD  Insulin Glargine (LANTUS) 100 UNIT/ML Solostar Pen Inject 40 Units into the skin at bedtime. 09/21/17   Timmothy Euler, MD  Linagliptin-Metformin HCl (JENTADUETO) 2.02-999 MG TABS Take 1 tablet by mouth 2 (two) times daily with a meal. 09/21/17   Timmothy Euler, MD  nitroGLYCERIN (NITROSTAT) 0.4 MG SL tablet Place 1 tablet (0.4 mg total) under the tongue every 5 (five) minutes as needed. For chest pain. 08/13/12   Reece Packer, NP  omeprazole (PRILOSEC) 40 MG capsule TAKE 1 CAPSULE BY MOUTH TWICE DAILY 10/11/17   Timmothy Euler, MD  potassium chloride (K-DUR) 10 MEQ tablet Take 1 tablet (10 mEq total) by mouth daily. 10/07/17   Julianne Rice, MD  sertraline (ZOLOFT) 100 MG tablet Take 1 tablet (100 mg total) by mouth daily. 09/21/17   Timmothy Euler, MD  traZODone (DESYREL) 150 MG tablet TAKE 1 TABLET BY MOUTH AT BEDTIME 08/09/17   Timmothy Euler, MD    Family History Family History  Problem Relation Age of Onset  . Stroke Mother   . Alcohol abuse Father   . Lung cancer Unknown   . Heart disease Unknown   . Other Unknown        not real familiar with family history  . Colon cancer Neg Hx   . Colon polyps Neg Hx     Social History Social History   Tobacco Use  . Smoking status: Heavy Tobacco Smoker    Packs/day: 1.00    Years: 30.00    [place] years: 30.00    Types: Cigarettes    Start date: 10/09/1981  . Smokeless tobacco: Never Used  Substance Use Topics  . 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  . Drug use: No     Allergies   Patient has no known allergies.   Review of Systems Review of Systems  All other systems reviewed and are negative.    Physical Exam Updated Vital Signs BP (!) 138/95   Pulse 79   Resp 18   Ht 1.88 m (6\' 2" )   Wt 97.1 kg (214 lb)   SpO2 100%   BMI 27.48 kg/m   Physical Exam  Constitutional: He is oriented to person, place,  and time. He appears well-developed and well-nourished.  HENT:  Head: Normocephalic and atraumatic.  Right Ear: External ear normal.  Left Ear: External ear normal.  Mouth/Throat: Oropharynx is clear and moist.  Eyes: EOM are normal. Pupils are equal, round, and reactive to light.  Neck: Normal range of motion.  Cardiovascular: Normal rate and regular rhythm.  Pulmonary/Chest: Effort normal and breath sounds normal.  Abdominal: Soft. Bowel sounds are normal. There is tenderness.    Genitourinary: Penis normal.  Musculoskeletal: Normal range of motion.  Neurological: He is alert and oriented to person, place, and time.  Skin: Skin is warm. Capillary refill takes less than 2 seconds.  Nursing note and vitals reviewed.    ED Treatments / Results  Labs (all labs ordered are listed, but only abnormal results are displayed) Labs Reviewed  URINALYSIS, ROUTINE W REFLEX MICROSCOPIC - Abnormal; Notable for the following components:      Result Value   Glucose, UA >=500 (*)    Hgb urine dipstick SMALL (*)    Protein, ur 100 (*)    All other components within normal limits  CBC WITH DIFFERENTIAL/PLATELET  BASIC METABOLIC PANEL    EKG  EKG Interpretation None       Radiology Ct Abdomen Pelvis W Contrast  Result Date: 10/28/2017 CLINICAL DATA:  52 year old male with urinary retention EXAM: CT ABDOMEN AND PELVIS WITH CONTRAST TECHNIQUE: Multidetector CT imaging of the abdomen and pelvis was performed  using the standard protocol following bolus administration of intravenous contrast. CONTRAST:  148mL ISOVUE-300 IOPAMIDOL (ISOVUE-300) INJECTION 61% COMPARISON:  Prior CT scan of the abdomen and pelvis 12/07/2013 FINDINGS: Lower chest: The lung bases are clear. Visualized cardiac structures are within normal limits for size. No pericardial effusion. Unremarkable visualized distal thoracic esophagus. Hepatobiliary: Normal hepatic contour and morphology. No discrete hepatic lesions. Normal appearance of the gallbladder. No intra or extrahepatic biliary ductal dilatation. Pancreas: Unremarkable. No pancreatic ductal dilatation or surrounding inflammatory changes. Spleen: Normal in size without focal abnormality. Adrenals/Urinary Tract: Adrenal glands are unremarkable. Kidneys are normal, without renal calculi, focal lesion, or hydronephrosis. Bladder is decompressed with a Foley catheter in place. Despite decompression, the bladder wall appears diffusely and symmetrically thickened. Stomach/Bowel: No evidence of obstruction or focal bowel wall thickening. Normal appendix in the right lower quadrant. The terminal ileum is unremarkable. Vascular/Lymphatic: No evidence of aneurysm. Mild atherosclerotic calcifications in the abdominal aorta. No focal venous abnormality. No suspicious lymphadenopathy. Reproductive: Prostatomegaly.  Central prostatic enhancement. Other: No abdominal wall hernia or abnormality. No abdominopelvic ascites. Musculoskeletal: No acute or significant osseous findings. IMPRESSION: 1. The bladder is decompressed with a Foley catheter in place. Despite the decompression, the bladder wall appears diffusely and symmetrically thickened. Differential considerations include both infectious and inflammatory cystitis. Diffuse infiltrating bladder neoplasm could appear similar in the appropriate clinical setting. 2. Prostatomegaly with nonspecific central enhancement. Recommend correlation with PSA.  Electronically Signed   By: Jacqulynn Cadet M.D.   On: 10/28/2017 19:30    Procedures Procedures (including critical care time)  Medications Ordered in ED Medications - No data to display   Initial Impression / Assessment and Plan / ED Course  I have reviewed the triage vital signs and the nursing notes.  Pertinent labs & imaging results that were available during my care of the patient were reviewed by me and considered in my medical decision making (see chart for details).     Foley catheter placed only 200 cc of urine out. DC Foley catheter. Patient  with diffusely inflamed bladder wall which may represent cystitis.  He also has an enlarged prostate.  Plan Levaquin and will refer to urology.  Discussed above with patient he voices understanding of plan and need for close follow-up. Final Clinical Impressions(s) / ED Diagnoses   Final diagnoses:  Cystitis  Prostate enlargement    ED Discharge Orders    None       Pattricia Boss, MD 10/28/17 Patrecia Pour    Pattricia Boss, MD 10/28/17 2005

## 2017-10-28 NOTE — Discharge Instructions (Signed)
Please take all antibiotics as prescribed. Call urology tomorrow for recheck Your prostate appears enlarged on ct scan and you may need further testing.

## 2017-10-28 NOTE — ED Notes (Signed)
Pt alert & oriented x4, stable gait. Patient given discharge instructions, paperwork & prescription(s). Patient  instructed to stop at the registration desk to finish any additional paperwork. Patient verbalized understanding. Pt left department w/ no further questions. 

## 2017-10-28 NOTE — ED Triage Notes (Signed)
Pt reports that he has been unable to urinate x 2 days. Pt states he is supposed to have a circumcision done, but it has been postponed.

## 2017-10-31 ENCOUNTER — Ambulatory Visit: Payer: Self-pay | Admitting: Cardiology

## 2017-11-01 ENCOUNTER — Ambulatory Visit: Payer: Self-pay | Admitting: Cardiovascular Disease

## 2017-11-01 ENCOUNTER — Encounter: Payer: Self-pay | Admitting: Cardiovascular Disease

## 2017-11-01 ENCOUNTER — Ambulatory Visit (INDEPENDENT_AMBULATORY_CARE_PROVIDER_SITE_OTHER): Payer: Medicare Other | Admitting: Cardiovascular Disease

## 2017-11-01 VITALS — BP 118/78 | HR 86 | Ht 74.0 in | Wt 210.0 lb

## 2017-11-01 DIAGNOSIS — Z9289 Personal history of other medical treatment: Secondary | ICD-10-CM

## 2017-11-01 DIAGNOSIS — I10 Essential (primary) hypertension: Secondary | ICD-10-CM | POA: Diagnosis not present

## 2017-11-01 DIAGNOSIS — Z720 Tobacco use: Secondary | ICD-10-CM | POA: Diagnosis not present

## 2017-11-01 DIAGNOSIS — Z955 Presence of coronary angioplasty implant and graft: Secondary | ICD-10-CM

## 2017-11-01 DIAGNOSIS — I25118 Atherosclerotic heart disease of native coronary artery with other forms of angina pectoris: Secondary | ICD-10-CM | POA: Diagnosis not present

## 2017-11-01 DIAGNOSIS — Z01818 Encounter for other preprocedural examination: Secondary | ICD-10-CM | POA: Diagnosis not present

## 2017-11-01 DIAGNOSIS — E785 Hyperlipidemia, unspecified: Secondary | ICD-10-CM | POA: Diagnosis not present

## 2017-11-01 NOTE — Progress Notes (Signed)
SUBJECTIVE: The patient presents for past due follow-up.  He was evaluated in the ED on 10/07/17 for bilateral leg edema.  He was started on Lasix and potassium replacement.  He had paraphimosis and urinary retention.  I last evaluated him over 2 years ago in December 2016.  He has coronary artery disease and was found to have critical stenosis of a small ramus in 2011 and underwent bare metal stent placement. Repeat catheterization in April 2013 showed in-stent stenosis of 70-80%. Medical therapy was advised that due to the small size of the vessel. He also has HTN, diabetes mellitus, GERD s/p esophageal dilatation, and hyperlipidemia.  He has an old left bundle branch block.  He has had numerous ED visits for a variety of reasons.  He was seen there for chest pain on 09/03/17.  I reviewed all relevant documentation, labs, and studies. His pain was nonexertional and improved with a GI cocktail.  He did not appear to be consistent with prior anginal symptoms.  Troponins were normal.  Chest x-ray was also normal. Incidentally, chest x-ray was also normal on 10/07/17.  CT of the abdomen and pelvis on 10/28/17 showed diffuse symmetric bladder wall thickening and prostatomegaly with nonspecific central enhancement.  I personally reviewed his ECG performed on 10/07/17 which showed sinus rhythm with left bundle branch block.  The patient denies any symptoms of chest pain, palpitations, shortness of breath, lightheadedness, dizziness, leg swelling, orthopnea, PND, and syncope.  Carotid Dopplers on 10/21/15 showed mild atherosclerotic plaque bilaterally with no significant stenosis.  Lipids on 01/15/17 which I reviewed showed total cholesterol 166, elevated triglycerides of 376, LDL of 52, HDL 39.  He is scheduled to see urology on 11/29/17 in [place].  He has not had an echocardiogram since 2014.   Review of Systems: As per "subjective", otherwise negative.  No Known  Allergies  Current Outpatient Medications  Medication Sig Dispense Refill  . albuterol (PROVENTIL HFA;VENTOLIN HFA) 108 (90 Base) MCG/ACT inhaler Inhale 2 puffs into the lungs every 6 (six) hours as needed for wheezing. 1 Inhaler 2  . albuterol (PROVENTIL) (2.5 MG/3ML) 0.083% nebulizer solution Take 3 mLs (2.5 mg total) by nebulization every 6 (six) hours as needed for wheezing or shortness of breath. 75 mL 3  . aspirin EC 81 MG tablet Take 81 mg by mouth daily.    [name] atorvastatin (LIPITOR) 80 MG tablet Take 1 tablet (80 mg total) by mouth daily. (Patient taking differently: Take 80 mg by mouth every evening. ) 90 tablet 1  . busPIRone (BUSPAR) 7.5 MG tablet Take 1 tablet (7.5 mg total) by mouth 2 (two) times daily. 60 tablet 3  . fenofibrate micronized (ANTARA) 130 MG capsule Take 130 mg by mouth daily before breakfast.    . furosemide (LASIX) 20 MG tablet Take 1 tablet (20 mg total) by mouth daily. 10 tablet 0  . gabapentin (NEURONTIN) 800 MG tablet Take 1 tablet (800 mg total) by mouth 3 (three) times daily. 90 tablet 11  . glucose blood (ONE TOUCH ULTRA TEST) test strip Use to check blood sugars twice daily 100 each 5  . insulin aspart (NOVOLOG FLEXPEN) 100 UNIT/ML FlexPen Inject 10 Units into the skin 3 (three) times daily with meals. Inject up to 10 units prior to meal per sliding scale. 15 mL 2  . Insulin Glargine (LANTUS) 100 UNIT/ML Solostar Pen Inject 40 Units into the skin at bedtime. 30 mL 2  . levofloxacin (LEVAQUIN) 500 MG tablet Take  1 tablet (500 mg total) by mouth daily. 14 tablet 0  . Linagliptin-Metformin HCl (JENTADUETO) 2.02-999 MG TABS Take 1 tablet by mouth 2 (two) times daily with a meal. 180 tablet 3  . nitroGLYCERIN (NITROSTAT) 0.4 MG SL tablet Place 1 tablet (0.4 mg total) under the tongue every 5 (five) minutes as needed. For chest pain. 25 tablet 3  . omeprazole (PRILOSEC) 40 MG capsule TAKE 1 CAPSULE BY MOUTH TWICE DAILY 180 capsule 0  . potassium chloride (K-DUR) 10  MEQ tablet Take 1 tablet (10 mEq total) by mouth daily. 10 tablet 0  . sertraline (ZOLOFT) 100 MG tablet Take 1 tablet (100 mg total) by mouth daily. 90 tablet 3  . traZODone (DESYREL) 150 MG tablet TAKE 1 TABLET BY MOUTH AT BEDTIME 90 tablet 1   No current facility-administered medications for this visit.     Past Medical History:  Diagnosis Date  . Anxiety   . Asthma   . COPD (chronic obstructive pulmonary disease) ([place])   . Coronary artery disease    s/p BMS to Ramus 10/2010;  Cath 01/22/12 prox 30-40% LAD, LCx ramus w/ hazy 70-80% in-stent restenosis, EF 60% treated medically  . Coronary artery disease    s/p BMS to Ramus 10/2010;  Cath 01/22/12 prox 30-40% LAD, LCx ramus w/ hazy 70-80% in-stent restenosis, EF 60% treated medically   . Depression   . Diabetes mellitus    Type 2  . Gastroparesis 05/2013  . GERD (gastroesophageal reflux disease)   . HTN (hypertension)   . Hyperlipidemia   . Hyperlipidemia   . Major depression, chronic 08/13/2012  . Melanoma ([place]) 2007   surgery at [facility], Followed by Neijstrom  . Myocardial infarction ([place]) 2012  . Obesity   . Tobacco abuse   . Tubular adenoma     Past Surgical History:  Procedure Laterality Date  . CARDIAC CATHETERIZATION     with stent  . COLONOSCOPY N/A 01/27/2013   MPN:TIRWER and colonic polyps. Tubular adenomas  . CORONARY ANGIOPLASTY WITH STENT PLACEMENT    . ESOPHAGOGASTRODUODENOSCOPY  04/24/2012   Rourk-mild erosive reflux esophagitis,dilated w/42F Venia Minks, small HH, minimal chronic gastric/bulbar erosions(No H pylori)  . LEFT HEART CATHETERIZATION WITH CORONARY ANGIOGRAM N/A 01/22/2012   Procedure: LEFT HEART CATHETERIZATION WITH CORONARY ANGIOGRAM;  Surgeon: Jolaine Artist, MD;  Location: [facility] CATH LAB;  Service: Cardiovascular;  Laterality: N/A;  . melanoma surgery  2007   [place], removed lymph nodes under arm as well,Left abd    Social History   Socioeconomic History  . Marital status: Divorced    Spouse  name: rayann Ruane  . Number of children: 0  . Years of education: Not on file  . Highest education level: Not on file  Social Needs  . Financial resource strain: Not on file  . Food insecurity - worry: Not on file  . Food insecurity - inability: Not on file  . Transportation needs - medical: Not on file  . Transportation needs - non-medical: Not on file  Occupational History  . Occupation: disabled  Tobacco Use  . Smoking status: Current Every Day Smoker    Packs/day: 1.00    Years: 30.00    Houser years: 30.00    Types: Cigarettes    Start date: 10/09/1981  . Smokeless tobacco: Never Used  . Tobacco comment: 1/2 [place] daily 11/01/17  Substance and Sexual Activity  . 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  . Drug use: No  . Sexual activity: Yes  Other Topics Concern  . Not on file  Social History Narrative   Lives w/ wife     Vitals:   11/01/17 1433  BP: 118/78  Pulse: 86  SpO2: 96%  Weight: 210 lb (95.3 kg)  Height: 6\' 2"  (1.88 m)    Wt Readings from Last 3 Encounters:  11/01/17 210 lb (95.3 kg)  10/28/17 214 lb (97.1 kg)  10/15/17 214 lb (97.1 kg)     PHYSICAL EXAM General: NAD HEENT: Poor dentition Neck: No JVD, no thyromegaly. Lungs: Clear to auscultation bilaterally with normal respiratory effort. CV: Regular rate and rhythm, normal S1/S2, no S3/S4, no murmur. No pretibial or periankle edema.  No carotid bruit.   Abdomen: Soft, nontender, no distention.  Neurologic: Alert and oriented.  Psych: Normal affect. Skin: Normal. Musculoskeletal: No gross deformities.    ECG: Most recent ECG reviewed.   Cath (April 2013):  Left main: Normal  LAD: Prox 30-40% Mild diffuse plaquing throughout. Small bridging section in midsection. Large branching diagonal that came off ostium of LAD. Mild plaque in ostium.  LCX: Gave off small to moderate  sized ramus and two small OMs. In ostium of ramus there was a BMS with 70-80% hazy in-stent restenosis. Otherwise mild plaquing in the LCX  RCA: Dominant vessel with PDA and PL.Two 30% lesions in midsection. Mild plaque in PDA.  LV-gram done in the RAO projection: Ejection fraction = 60% No regional wall motion abnormalities.   Labs: Lab Results  Component Value Date/Time   K 4.4 10/28/2017 05:12 PM   BUN 15 10/28/2017 05:12 PM   BUN 17 08/28/2017 04:34 PM   CREATININE 1.23 10/28/2017 05:12 PM   CREATININE 0.97 05/07/2013 09:55 AM   ALT 14 (L) 10/07/2017 12:57 PM   TSH 0.882 12/10/2013 11:58 AM   HGB 14.4 10/28/2017 05:12 PM   HGB 15.3 08/28/2017 04:34 PM     Lipids: Lab Results  Component Value Date/Time   LDLCALC 52 01/15/2017 10:36 AM   LDLCALC 175 (H) 03/19/2013 11:31 AM   CHOL 166 01/15/2017 10:36 AM   CHOL 277 (H) 03/19/2013 11:31 AM   TRIG 376 (H) 01/15/2017 10:36 AM   TRIG 292 (H) 03/19/2013 11:31 AM   HDL 39 (L) 01/15/2017 10:36 AM   HDL 44 03/19/2013 11:31 AM       ASSESSMENT AND PLAN:  1. CAD: Symptomatically stable. Continue ASA and high dose Lipitor.  I will obtain an echocardiogram as it has been nearly 5 years since his last one.  I will assess cardiac structure and function as he plans to undergo a urologic procedure in the near future.  2. Essential HTN: Controlled on present therapy. No changes required.  3. Hyperlipidemia: On Lipitor 80 mg daily.  Lipids reviewed above.  4. Tobacco abuse.  5.  Preoperative risk stratification:  I will obtain an echocardiogram as it has been nearly 5 years since his last one.  I will assess cardiac structure and function as he plans to undergo a urologic procedure in the near future.    Disposition: Follow up 1 year.  Time spent: 40 minutes, of which greater than 50% was spent reviewing symptoms, relevant blood tests and studies, and discussing management plan with the patient.    [name], M.D.,  F.A.C.C.

## 2017-11-01 NOTE — Patient Instructions (Signed)

## 2017-11-15 ENCOUNTER — Ambulatory Visit (INDEPENDENT_AMBULATORY_CARE_PROVIDER_SITE_OTHER): Payer: Medicare Other

## 2017-11-15 ENCOUNTER — Other Ambulatory Visit: Payer: Self-pay

## 2017-11-15 DIAGNOSIS — I25118 Atherosclerotic heart disease of native coronary artery with other forms of angina pectoris: Secondary | ICD-10-CM | POA: Diagnosis not present

## 2017-11-19 ENCOUNTER — Other Ambulatory Visit: Payer: Self-pay | Admitting: Family Medicine

## 2017-11-20 ENCOUNTER — Telehealth: Payer: Self-pay | Admitting: *Deleted

## 2017-11-20 NOTE — Telephone Encounter (Signed)
Patient notified. Copy to pmd. Stated that he does not have the procedure scheduled yet - will let us know if needing information faxed to specific provider.   ------  Notes recorded by Herminio Commons, MD on 11/19/2017 at 10:18 AM EST He can proceed with planned procedure. ------  Notes recorded by Arnoldo Lenis, MD on 11/16/2017 at 12:58 PM EST Echo looks good, overall normal heart function. Will defer final clearance for procedure to Dr Raliegh Ip who is back Monday   J BranchMD ------  Notes recorded by Satira Sark, MD on 11/15/2017 at 1:39 PM EST Patient of Dr. Bronson Ing, study was inadvertently ordered under my name. I will forward for his review.

## 2017-11-29 ENCOUNTER — Ambulatory Visit (INDEPENDENT_AMBULATORY_CARE_PROVIDER_SITE_OTHER): Payer: Medicare Other | Admitting: Gastroenterology

## 2017-11-29 ENCOUNTER — Encounter: Payer: Self-pay | Admitting: Gastroenterology

## 2017-11-29 ENCOUNTER — Encounter: Payer: Self-pay | Admitting: *Deleted

## 2017-11-29 ENCOUNTER — Telehealth: Payer: Self-pay | Admitting: *Deleted

## 2017-11-29 ENCOUNTER — Other Ambulatory Visit: Payer: Self-pay | Admitting: *Deleted

## 2017-11-29 VITALS — BP 130/76 | HR 98 | Temp 97.4°F | Ht 74.0 in | Wt 217.6 lb

## 2017-11-29 DIAGNOSIS — K219 Gastro-esophageal reflux disease without esophagitis: Secondary | ICD-10-CM

## 2017-11-29 DIAGNOSIS — R1084 Generalized abdominal pain: Secondary | ICD-10-CM | POA: Diagnosis not present

## 2017-11-29 DIAGNOSIS — R197 Diarrhea, unspecified: Secondary | ICD-10-CM | POA: Diagnosis not present

## 2017-11-29 DIAGNOSIS — Z860100 Personal history of colon polyps, unspecified: Secondary | ICD-10-CM

## 2017-11-29 DIAGNOSIS — R1013 Epigastric pain: Secondary | ICD-10-CM

## 2017-11-29 DIAGNOSIS — I25118 Atherosclerotic heart disease of native coronary artery with other forms of angina pectoris: Secondary | ICD-10-CM | POA: Diagnosis not present

## 2017-11-29 MED ORDER — NA SULFATE-K SULFATE-MG SULF 17.5-3.13-1.6 GM/177ML PO SOLN: 1.0000 | ORAL | 0 refills | Status: DC

## 2017-11-29 NOTE — Assessment & Plan Note (Signed)
52 year old gentleman with history of diabetes, gastroparesis, Jerrye Bushy, with colon polyps who presents with several month history of abdominal pain and diarrhea. History of antibiotics intermittently, but he cannot recall the last time. Given chronicity, suspect infectious etiology less likely but needs to be excluded. Patient is overdue for colonoscopy, had poor prep in 2014 and was slated to have a follow-up colonoscopy in 2015 but he never completed.  Plan for stools studies in near future. Go ahead and schedule for colonoscopy with deep sedation.  I have discussed the risks, alternatives, benefits with regards to but not limited to the risk of reaction to medication, bleeding, infection, perforation and the patient is agreeable to proceed. Written consent to be obtained.  CONSIDER RANDOM COLON BIOPSIES IF CLINICALLY INDICATED.

## 2017-11-29 NOTE — Progress Notes (Signed)
Can we get a TTG, IgA and an IgA level during his pre-op appointment????

## 2017-11-29 NOTE — Patient Instructions (Signed)
1. Colonoscopy and upper endoscopy as scheduled. Separate instructions. 2. Prior to your procedures you need to complete stool studies. Please complete as soon as possible.

## 2017-11-29 NOTE — Addendum Note (Signed)
Addended by: Inge Rise on: 11/29/2017 12:18 PM   Modules accepted: Orders

## 2017-11-29 NOTE — Assessment & Plan Note (Addendum)
Epigastric pain in the setting of known gastroparesis. Typical Gerd well control. Outside of aspirin, no nsaid use. He continues to complain of early satiety but has documented 30 pound weight gain in the past six months without overt edema noted. CT in 10/2017 without evidence of liver disease or ascites.   Plan for upper endoscopy at time of colonoscopy.  I have discussed the risks, alternatives, benefits with regards to but not limited to the risk of reaction to medication, bleeding, infection, perforation and the patient is agreeable to proceed. Written consent to be obtained.  CONSIDER SMALL BOWEL BIOPSY TO EVALUATE FOR CELIAC DISEASE GIVEN CHRONIC DIARRHEA.

## 2017-11-29 NOTE — Telephone Encounter (Signed)
Called spoke with pt and is aware pre-op scheduled for 12/31/17 at 11:00am. Letter mailed.

## 2017-11-29 NOTE — Progress Notes (Signed)
cc'ed to pcp °

## 2017-11-29 NOTE — Progress Notes (Signed)
Primary Care Physician:  [name], DO  Primary Gastroenterologist:  Garfield Cornea, MD   Chief Complaint  Patient presents with  . Abdominal Pain    both sides x a while  . Diarrhea    "all day sometimes"    HPI:  Dustin Cruz is a 52 y.o. male here is here at the request of Dr. Kenn File for further evaluation of abdominal pain. He has a history of Gerd, gastroparesis (at 2 hours 93% activity remained in stomach on GES 2014), abdominal pain. Last seen back in 2014. He is well overdue for follow-up colonoscopy. In 2014 his prep was poor and was advised to come back in one year. He never had it done.  Patient seen in the ED on January 20 for urinary retention. CT abdomen and pelvis with contrast showed bladder decompression with Foley catheter in place but bladder wall appears diffusely and symmetrically thickened.  Patient presents with his girlfriend today. He reports six months of intermittent diarrhea. Happens most days. Some days, no stools, rarely constipation. No melena. Some toilet tissue hematochezia. A lot of times the diarrhea comes without much notice. He has to wear depends at times. His heartburn is well controlled, no vomiting or dysphagia. He complains of abdominal pain on a regular basis. Significant pain in the upper abdomen but really generalized. Not really worse with meals. Just happens.  Recently seen by cardiology for cardiac clearance for upcoming urological procedure. Documentation for clearance noted under telephone encounter dated 212 2019. He's had urinary retention issues. States he is going to be scheduled for circumcision but he wanted to complete our workup first.  Somewhat poor historian. He recalls taking some antibiotics within the last few months. When asked about medication changes he really denies any. He's not sure how long he's been on metformin but states has been for years.  He has noted 30 pound weight gain in the past 6 to 7  months.  Current Outpatient Medications  Medication Sig Dispense Refill  . albuterol (PROVENTIL HFA;VENTOLIN HFA) 108 (90 Base) MCG/ACT inhaler Inhale 2 puffs into the lungs every 6 (six) hours as needed for wheezing. 1 Inhaler 2  . albuterol (PROVENTIL) (2.5 MG/3ML) 0.083% nebulizer solution Take 3 mLs (2.5 mg total) by nebulization every 6 (six) hours as needed for wheezing or shortness of breath. 75 mL 3  . aspirin EC 81 MG tablet Take 81 mg by mouth daily.    [name] atorvastatin (LIPITOR) 80 MG tablet Take 1 tablet (80 mg total) by mouth daily. (Patient taking differently: Take 80 mg by mouth every evening. ) 90 tablet 1  . busPIRone (BUSPAR) 7.5 MG tablet Take 1 tablet (7.5 mg total) by mouth 2 (two) times daily. 60 tablet 3  . fenofibrate micronized (ANTARA) 130 MG capsule TAKE 1 CAPSULE BY MOUTH ONCE DAILY BEFORE BREAKFAST 90 capsule 0  . furosemide (LASIX) 20 MG tablet Take 1 tablet (20 mg total) by mouth daily. 10 tablet 0  . gabapentin (NEURONTIN) 800 MG tablet Take 1 tablet (800 mg total) by mouth 3 (three) times daily. 90 tablet 11  . glucose blood (ONE TOUCH ULTRA TEST) test strip Use to check blood sugars twice daily 100 each 5  . insulin aspart (NOVOLOG FLEXPEN) 100 UNIT/ML FlexPen Inject 10 Units into the skin 3 (three) times daily with meals. Inject up to 10 units prior to meal per sliding scale. 15 mL 2  . Insulin Glargine (LANTUS) 100 UNIT/ML Solostar Pen Inject 40  Units into the skin at bedtime. 30 mL 2  . Linagliptin-Metformin HCl (JENTADUETO) 2.02-999 MG TABS Take 1 tablet by mouth 2 (two) times daily with a meal. 180 tablet 3  . nitroGLYCERIN (NITROSTAT) 0.4 MG SL tablet Place 1 tablet (0.4 mg total) under the tongue every 5 (five) minutes as needed. For chest pain. 25 tablet 3  . omeprazole (PRILOSEC) 40 MG capsule TAKE 1 CAPSULE BY MOUTH TWICE DAILY 180 capsule 0  . potassium chloride (K-DUR) 10 MEQ tablet Take 1 tablet (10 mEq total) by mouth daily. 10 tablet 0  . sertraline  (ZOLOFT) 100 MG tablet Take 1 tablet (100 mg total) by mouth daily. 90 tablet 3  . traZODone (DESYREL) 150 MG tablet TAKE 1 TABLET BY MOUTH AT BEDTIME 90 tablet 1   No current facility-administered medications for this visit.     Allergies as of 11/29/2017  . (No Known Allergies)    Past Medical History:  Diagnosis Date  . Anxiety   . Asthma   . COPD (chronic obstructive pulmonary disease) ([place])   . Coronary artery disease    s/p BMS to Ramus 10/2010;  Cath 01/22/12 prox 30-40% LAD, LCx ramus w/ hazy 70-80% in-stent restenosis, EF 60% treated medically  . Coronary artery disease    s/p BMS to Ramus 10/2010;  Cath 01/22/12 prox 30-40% LAD, LCx ramus w/ hazy 70-80% in-stent restenosis, EF 60% treated medically   . Depression   . Diabetes mellitus    Type 2  . Gastroparesis 05/2013  . GERD (gastroesophageal reflux disease)   . HTN (hypertension)   . Hyperlipidemia   . Hyperlipidemia   . Major depression, chronic 08/13/2012  . Melanoma ([place]) 2007   surgery at [facility], Followed by Neijstrom  . Myocardial infarction ([place]) 2012  . Obesity   . Tobacco abuse   . Tubular adenoma     Past Surgical History:  Procedure Laterality Date  . CARDIAC CATHETERIZATION     with stent  . COLONOSCOPY N/A 01/27/2013   [id number] and colonic polyps. Tubular adenomas, poor bowel prep, one-year follow-up surveillance colonoscopy recommended  . CORONARY ANGIOPLASTY WITH STENT PLACEMENT    . ESOPHAGOGASTRODUODENOSCOPY  04/24/2012   Rourk-mild erosive reflux esophagitis,dilated w/76F Venia Minks, small HH, minimal chronic gastric/bulbar erosions(No H pylori)  . LEFT HEART CATHETERIZATION WITH CORONARY ANGIOGRAM N/A 01/22/2012   Procedure: LEFT HEART CATHETERIZATION WITH CORONARY ANGIOGRAM;  Surgeon: Jolaine Artist, MD;  Location: [facility] CATH LAB;  Service: Cardiovascular;  Laterality: N/A;  . melanoma surgery  2007   [place], removed lymph nodes under arm as well,Left abd    Family History  Problem Relation  Age of Onset  . Stroke Mother   . Alcohol abuse Father   . Lung cancer Unknown   . Heart disease Unknown   . Other Unknown        not real familiar with family history  . Colon cancer Neg Hx   . Colon polyps Neg Hx     Social History   Socioeconomic History  . Marital status: Divorced    Spouse name: rayann Swint  . Number of children: 0  . Years of education: Not on file  . Highest education level: Not on file  Social Needs  . Financial resource strain: Not on file  . Food insecurity - worry: Not on file  . Food insecurity - inability: Not on file  . Transportation needs - medical: Not on file  . Transportation needs - non-medical: Not on file  Occupational History  . Occupation: disabled  Tobacco Use  . Smoking status: Current Every Day Smoker    Packs/day: 1.00    Years: 30.00    Yum years: 30.00    Types: Cigarettes    Start date: 10/09/1981  . Smokeless tobacco: Never Used  . Tobacco comment: 1/2 [place] daily 11/01/17  Substance and Sexual Activity  . 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  . Drug use: No  . Sexual activity: Yes  Other Topics Concern  . Not on file  Social History Narrative   Lives w/ wife      ROS:  General: Negative for anorexia, weight loss, fever, chills, fatigue, weakness. Eyes: Negative for vision changes.  ENT: Negative for hoarseness, difficulty swallowing , nasal congestion. CV: Negative for chest pain, angina, palpitations, dyspnea on exertion, peripheral edema.  Respiratory: Negative for dyspnea at rest, dyspnea on exertion, cough, sputum, wheezing.  GI: See history of present illness. GU:  Negative for dysuria, hematuria, urinary incontinence, urinary frequency, nocturnal urination.  MS: Negative for joint pain, low back pain.  Derm: Negative for rash or itching.  Neuro: Negative for weakness,   seizure, frequent  headaches, memory loss, confusion. Neuropathy in the feet Psych: Negative for anxiety, depression, suicidal ideation, hallucinations.  Endo: Negative for unusual weight change.  Heme: Negative for bruising or bleeding. Allergy: Negative for rash or hives.    Physical Examination:  BP 130/76   Pulse 98   Temp (!) 97.4 F (36.3 C) (Oral)   Ht 6\' 2"  (1.88 m)   Wt 217 lb 9.6 oz (98.7 kg)   BMI 27.94 kg/m    General: Well-nourished, well-developed in no acute distress.  Head: Normocephalic, atraumatic.   Eyes: Conjunctiva pink, no icterus. Mouth: Oropharyngeal mucosa moist and pink , no lesions erythema or exudate. Neck: Supple without thyromegaly, masses, or lymphadenopathy.  Lungs: Clear to auscultation bilaterally.  Heart: Regular rate and rhythm, no murmurs rubs or gallops.  Abdomen: Bowel sounds are normal, mild to mod epig tenderness, minimal llq tenderness, nondistended, no hepatosplenomegaly or masses, no abdominal bruits or    hernia , no rebound or guarding.   Rectal: deferred Extremities: trace edema at the ankles bilaterally. No clubbing or deformities.  Neuro: Alert and oriented x 4 , grossly normal neurologically.  Skin: Warm and dry, no rash or jaundice.   Psych: Alert and cooperative, normal mood and affect.  Labs: Lab Results  Component Value Date   CREATININE 1.23 10/28/2017   BUN 15 10/28/2017   NA 138 10/28/2017   K 4.4 10/28/2017   CL 100 (L) 10/28/2017   CO2 27 10/28/2017   Lab Results  Component Value Date   ALT 14 (L) 10/07/2017   AST 22 10/07/2017   ALKPHOS 27 (L) 10/07/2017   BILITOT 0.5 10/07/2017   Lab Results  Component Value Date   WBC 6.8 10/28/2017   HGB 14.4 10/28/2017   HCT 43.4 10/28/2017   MCV 95.2 10/28/2017   PLT 362 10/28/2017     Imaging Studies: No results found.

## 2017-11-29 NOTE — Progress Notes (Signed)
Called [place] and [facility] to advise additional blood work to be done on pt.

## 2017-11-30 ENCOUNTER — Ambulatory Visit: Payer: Self-pay | Admitting: Cardiovascular Disease

## 2017-11-30 NOTE — Progress Notes (Signed)
Orders already placed by MS

## 2017-12-08 DIAGNOSIS — R197 Diarrhea, unspecified: Secondary | ICD-10-CM | POA: Diagnosis not present

## 2017-12-08 DIAGNOSIS — E1143 Type 2 diabetes mellitus with diabetic autonomic (poly)neuropathy: Secondary | ICD-10-CM | POA: Diagnosis not present

## 2017-12-08 DIAGNOSIS — K3184 Gastroparesis: Secondary | ICD-10-CM | POA: Diagnosis not present

## 2017-12-08 DIAGNOSIS — R1013 Epigastric pain: Secondary | ICD-10-CM | POA: Diagnosis not present

## 2017-12-08 DIAGNOSIS — K210 Gastro-esophageal reflux disease with esophagitis: Secondary | ICD-10-CM | POA: Diagnosis not present

## 2017-12-27 NOTE — Patient Instructions (Signed)
Dustin Cruz  12/27/2017     @PREFPERIOPPHARMACY @   Your procedure is scheduled on  01/07/2018   Report to [facility] at  35   A.M.  Call this number if you have problems the morning of surgery:  [phone]   Remember:  Do not eat food or drink liquids after midnight.  Take these medicines the morning of surgery with A SIP OF WATER  Buspar, neurontin, prilosec. Use your inhaler and your nebulizer before you come. Take 17.5 units of Lantus the night before your surgery. DO NOT take any medications for diabetes the morning of your procedure.   Do not wear jewelry, make-up or nail polish.  Do not wear lotions, powders, or perfumes, or deodorant.  Do not shave 48 hours prior to surgery.  Men may shave face and neck.  Do not bring valuables to the hospital.  [facility] is not responsible for any belongings or valuables.  Contacts, dentures or bridgework may not be worn into surgery.  Leave your suitcase in the car.  After surgery it may be brought to your room.  For patients admitted to the hospital, discharge time will be determined by your treatment team.  Patients discharged the day of surgery will not be allowed to drive home.   Name and phone number of your driver:   family Special instructions:  Follow the diet and prep instructions given to you by Dr Roseanne Kaufman office.  Please read over the following fact sheets that you were given. Anesthesia Post-op Instructions and Care and Recovery After Surgery       Esophagogastroduodenoscopy Esophagogastroduodenoscopy (EGD) is a procedure to examine the lining of the esophagus, stomach, and first part of the small intestine (duodenum). This procedure is done to check for problems such as inflammation, bleeding, ulcers, or growths. During this procedure, a long, flexible, lighted tube with a camera attached (endoscope) is inserted down the throat. Tell a health care provider about:  Any allergies you  have.  All medicines you are taking, including vitamins, herbs, eye drops, creams, and over-the-counter medicines.  Any problems you or family members have had with anesthetic medicines.  Any blood disorders you have.  Any surgeries you have had.  Any medical conditions you have.  Whether you are pregnant or may be pregnant. What are the risks? Generally, this is a safe procedure. However, problems may occur, including:  Infection.  Bleeding.  A tear (perforation) in the esophagus, stomach, or duodenum.  Trouble breathing.  Excessive sweating.  Spasms of the larynx.  A slowed heartbeat.  Low blood pressure.  What happens before the procedure?  Follow instructions from your health care provider about eating or drinking restrictions.  Ask your health care provider about: ? Changing or stopping your regular medicines. This is especially important if you are taking diabetes medicines or blood thinners. ? Taking medicines such as aspirin and ibuprofen. These medicines can thin your blood. Do not take these medicines before your procedure if your health care provider instructs you not to.  Plan to have someone take you home after the procedure.  If you wear dentures, be ready to remove them before the procedure. What happens during the procedure?  To reduce your risk of infection, your health care team will wash or sanitize their hands.  An IV tube will be put in a vein in your hand or arm. You will get medicines and  fluids through this tube.  You will be given one or more of the following: ? A medicine to help you relax (sedative). ? A medicine to numb the area (local anesthetic). This medicine may be sprayed into your throat. It will make you feel more comfortable and keep you from gagging or coughing during the procedure. ? A medicine for pain.  A mouth guard may be placed in your mouth to protect your teeth and to keep you from biting on the endoscope.  You will  be asked to lie on your left side.  The endoscope will be lowered down your throat into your esophagus, stomach, and duodenum.  Air will be put into the endoscope. This will help your health care provider see better.  The lining of your esophagus, stomach, and duodenum will be examined.  Your health care provider may: ? Take a tissue sample so it can be looked at in a lab (biopsy). ? Remove growths. ? Remove objects (foreign bodies) that are stuck. ? Treat any bleeding with medicines or other devices that stop tissue from bleeding. ? Widen (dilate) or stretch narrowed areas of your esophagus and stomach.  The endoscope will be taken out. The procedure may vary among health care providers and hospitals. What happens after the procedure?  Your blood pressure, heart rate, breathing rate, and blood oxygen level will be monitored often until the medicines you were given have worn off.  Do not eat or drink anything until the numbing medicine has worn off and your gag reflex has returned. This information is not intended to replace advice given to you by your health care provider. Make sure you discuss any questions you have with your health care provider. Document Released: 01/26/2005 Document Revised: 03/02/2016 Document Reviewed: 08/19/2015 Elsevier Interactive Patient Education  2018 [employer]. Esophagogastroduodenoscopy, Care After Refer to this sheet in the next few weeks. These instructions provide you with information about caring for yourself after your procedure. Your health care provider may also give you more specific instructions. Your treatment has been planned according to current medical practices, but problems sometimes occur. Call your health care provider if you have any problems or questions after your procedure. What can I expect after the procedure? After the procedure, it is common to have:  A sore throat.  Nausea.  Bloating.  Dizziness.  Fatigue.  Follow  these instructions at home:  Do not eat or drink anything until the numbing medicine (local anesthetic) has worn off and your gag reflex has returned. You will know that the local anesthetic has worn off when you can swallow comfortably.  Do not drive for 24 hours if you received a medicine to help you relax (sedative).  If your health care provider took a tissue sample for testing during the procedure, make sure to get your test results. This is your responsibility. Ask your health care provider or the department performing the test when your results will be ready.  Keep all follow-up visits as told by your health care provider. This is important. Contact a health care provider if:  You cannot stop coughing.  You are not urinating.  You are urinating less than usual. Get help right away if:  You have trouble swallowing.  You cannot eat or drink.  You have throat or chest pain that gets worse.  You are dizzy or light-headed.  You faint.  You have nausea or vomiting.  You have chills.  You have a fever.  You have severe  abdominal pain.  You have black, tarry, or bloody stools. This information is not intended to replace advice given to you by your health care provider. Make sure you discuss any questions you have with your health care provider. Document Released: 09/11/2012 Document Revised: 03/02/2016 Document Reviewed: 08/19/2015 Elsevier Interactive Patient Education  2018 [employer].  Colonoscopy, Adult A colonoscopy is an exam to look at the large intestine. It is done to check for problems, such as:  Lumps (tumors).  Growths (polyps).  Swelling (inflammation).  Bleeding.  What happens before the procedure? Eating and drinking Follow instructions from your doctor about eating and drinking. These instructions may include:  A few days before the procedure - follow a low-fiber diet. ? Avoid nuts. ? Avoid seeds. ? Avoid dried fruit. ? Avoid raw  fruits. ? Avoid vegetables.  1-3 days before the procedure - follow a clear liquid diet. Avoid liquids that have red or purple dye. Drink only clear liquids, such as: ? Clear broth or bouillon. ? Black coffee or tea. ? Clear juice. ? Clear soft drinks or sports drinks. ? Gelatin dessert. ? Popsicles.  On the day of the procedure - do not eat or drink anything during the 2 hours before the procedure.  Bowel prep If you were prescribed an oral bowel prep:  Take it as told by your doctor. Starting the day before your procedure, you will need to drink a lot of liquid. The liquid will cause you to poop (have bowel movements) until your poop is almost clear or light green.  If your skin or butt gets irritated from diarrhea, you may: ? Wipe the area with wipes that have medicine in them, such as adult wet wipes with aloe and vitamin E. ? Put something on your skin that soothes the area, such as petroleum jelly.  If you throw up (vomit) while drinking the bowel prep, take a break for up to 60 minutes. Then begin the bowel prep again. If you keep throwing up and you cannot take the bowel prep without throwing up, call your doctor.  General instructions  Ask your doctor about changing or stopping your normal medicines. This is important if you take diabetes medicines or blood thinners.  Plan to have someone take you home from the hospital or clinic. What happens during the procedure?  An IV tube may be put into one of your veins.  You will be given medicine to help you relax (sedative).  To reduce your risk of infection: ? Your doctors will wash their hands. ? Your anal area will be washed with soap.  You will be asked to lie on your side with your knees bent.  Your doctor will get a long, thin, flexible tube ready. The tube will have a camera and a light on the end.  The tube will be put into your anus.  The tube will be gently put into your large intestine.  Air will be  delivered into your large intestine to keep it open. You may feel some pressure or cramping.  The camera will be used to take photos.  A small tissue sample may be removed from your body to be looked at under a microscope (biopsy). If any possible problems are found, the tissue will be sent to a lab for testing.  If small growths are found, your doctor may remove them and have them checked for cancer.  The tube that was put into your anus will be slowly removed. The procedure  may vary among doctors and hospitals. What happens after the procedure?  Your doctor will check on you often until the medicines you were given have worn off.  Do not drive for 24 hours after the procedure.  You may have a small amount of blood in your poop.  You may pass gas.  You may have mild cramps or bloating in your belly (abdomen).  It is up to you to get the results of your procedure. Ask your doctor, or the department performing the procedure, when your results will be ready. This information is not intended to replace advice given to you by your health care provider. Make sure you discuss any questions you have with your health care provider. Document Released: 10/28/2010 Document Revised: 07/26/2016 Document Reviewed: 12/07/2015 Elsevier Interactive Patient Education  2017 [employer]  Colonoscopy, Adult, Care After This sheet gives you information about how to care for yourself after your procedure. Your health care provider may also give you more specific instructions. If you have problems or questions, contact your health care provider. What can I expect after the procedure? After the procedure, it is common to have:  A small amount of blood in your stool for 24 hours after the procedure.  Some gas.  Mild abdominal cramping or bloating.  Follow these instructions at home: General instructions   For the first 24 hours after the procedure: ? Do not drive or use machinery. ? Do not sign  important documents. ? Do not drink alcohol. ? Do your regular daily activities at a slower pace than normal. ? Eat soft, easy-to-digest foods. ? Rest often.  Take over-the-counter or prescription medicines only as told by your health care provider.  It is up to you to get the results of your procedure. Ask your health care provider, or the department performing the procedure, when your results will be ready. Relieving cramping and bloating  Try walking around when you have cramps or feel bloated.  Apply heat to your abdomen as told by your health care provider. Use a heat source that your health care provider recommends, such as a moist heat [place] or a heating pad. ? Place a towel between your skin and the heat source. ? Leave the heat on for 20-30 minutes. ? Remove the heat if your skin turns bright red. This is especially important if you are unable to feel pain, heat, or cold. You may have a greater risk of getting burned. Eating and drinking  Drink enough fluid to keep your urine clear or pale yellow.  Resume your normal diet as instructed by your health care provider. Avoid heavy or fried foods that are hard to digest.  Avoid drinking alcohol for as long as instructed by your health care provider. Contact a health care provider if:  You have blood in your stool 2-3 days after the procedure. Get help right away if:  You have more than a small spotting of blood in your stool.  You pass large blood clots in your stool.  Your abdomen is swollen.  You have nausea or vomiting.  You have a fever.  You have increasing abdominal pain that is not relieved with medicine. This information is not intended to replace advice given to you by your health care provider. Make sure you discuss any questions you have with your health care provider. Document Released: 05/09/2004 Document Revised: 06/19/2016 Document Reviewed: 12/07/2015 Elsevier Interactive Patient Education  2018 [employer].  Monitored Anesthesia Care Anesthesia is a  term that refers to techniques, procedures, and medicines that help a person stay safe and comfortable during a medical procedure. Monitored anesthesia care, or sedation, is one type of anesthesia. Your anesthesia specialist may recommend sedation if you will be having a procedure that does not require you to be unconscious, such as:  Cataract surgery.  A dental procedure.  A biopsy.  A colonoscopy.  During the procedure, you may receive a medicine to help you relax (sedative). There are three levels of sedation:  Mild sedation. At this level, you may feel awake and relaxed. You will be able to follow directions.  Moderate sedation. At this level, you will be sleepy. You may not remember the procedure.  Deep sedation. At this level, you will be asleep. You will not remember the procedure.  The more medicine you are given, the deeper your level of sedation will be. Depending on how you respond to the procedure, the anesthesia specialist may change your level of sedation or the type of anesthesia to fit your needs. An anesthesia specialist will monitor you closely during the procedure. Let your health care provider know about:  Any allergies you have.  All medicines you are taking, including vitamins, herbs, eye drops, creams, and over-the-counter medicines.  Any use of steroids (by mouth or as a cream).  Any problems you or family members have had with sedatives and anesthetic medicines.  Any blood disorders you have.  Any surgeries you have had.  Any medical conditions you have, such as sleep apnea.  Whether you are pregnant or may be pregnant.  Any use of cigarettes, alcohol, or street drugs. What are the risks? Generally, this is a safe procedure. However, problems may occur, including:  Getting too much medicine (oversedation).  Nausea.  Allergic reaction to medicines.  Trouble breathing. If this happens, a breathing  tube may be used to help with breathing. It will be removed when you are awake and breathing on your own.  Heart trouble.  Lung trouble.  Before the procedure Staying hydrated Follow instructions from your health care provider about hydration, which may include:  Up to 2 hours before the procedure - you may continue to drink clear liquids, such as water, clear fruit juice, black coffee, and plain tea.  Eating and drinking restrictions Follow instructions from your health care provider about eating and drinking, which may include:  8 hours before the procedure - stop eating heavy meals or foods such as meat, fried foods, or fatty foods.  6 hours before the procedure - stop eating light meals or foods, such as toast or cereal.  6 hours before the procedure - stop drinking milk or drinks that contain milk.  2 hours before the procedure - stop drinking clear liquids.  Medicines Ask your health care provider about:  Changing or stopping your regular medicines. This is especially important if you are taking diabetes medicines or blood thinners.  Taking medicines such as aspirin and ibuprofen. These medicines can thin your blood. Do not take these medicines before your procedure if your health care provider instructs you not to.  Tests and exams  You will have a physical exam.  You may have blood tests done to show: ? How well your kidneys and liver are working. ? How well your blood can clot.  General instructions  Plan to have someone take you home from the hospital or clinic.  If you will be going home right after the procedure, plan to have someone with  you for 24 hours.  What happens during the procedure?  Your blood pressure, heart rate, breathing, level of pain and overall condition will be monitored.  An IV tube will be inserted into one of your veins.  Your anesthesia specialist will give you medicines as needed to keep you comfortable during the procedure. This  may mean changing the level of sedation.  The procedure will be performed. After the procedure  Your blood pressure, heart rate, breathing rate, and blood oxygen level will be monitored until the medicines you were given have worn off.  Do not drive for 24 hours if you received a sedative.  You may: ? Feel sleepy, clumsy, or nauseous. ? Feel forgetful about what happened after the procedure. ? Have a sore throat if you had a breathing tube during the procedure. ? Vomit. This information is not intended to replace advice given to you by your health care provider. Make sure you discuss any questions you have with your health care provider. Document Released: 06/21/2005 Document Revised: 03/03/2016 Document Reviewed: 01/16/2016 Elsevier Interactive Patient Education  2018 [place], Care After These instructions provide you with information about caring for yourself after your procedure. Your health care provider may also give you more specific instructions. Your treatment has been planned according to current medical practices, but problems sometimes occur. Call your health care provider if you have any problems or questions after your procedure. What can I expect after the procedure? After your procedure, it is common to:  Feel sleepy for several hours.  Feel clumsy and have poor balance for several hours.  Feel forgetful about what happened after the procedure.  Have poor judgment for several hours.  Feel nauseous or vomit.  Have a sore throat if you had a breathing tube during the procedure.  Follow these instructions at home: For at least 24 hours after the procedure:   Do not: ? Participate in activities in which you could fall or become injured. ? Drive. ? Use heavy machinery. ? Drink alcohol. ? Take sleeping pills or medicines that cause drowsiness. ? Make important decisions or sign legal documents. ? Take care of children on your  own.  Rest. Eating and drinking  Follow the diet that is recommended by your health care provider.  If you vomit, drink water, juice, or soup when you can drink without vomiting.  Make sure you have little or no nausea before eating solid foods. General instructions  Have a responsible adult stay with you until you are awake and alert.  Take over-the-counter and prescription medicines only as told by your health care provider.  If you smoke, do not smoke without supervision.  Keep all follow-up visits as told by your health care provider. This is important. Contact a health care provider if:  You keep feeling nauseous or you keep vomiting.  You feel light-headed.  You develop a rash.  You have a fever. Get help right away if:  You have trouble breathing. This information is not intended to replace advice given to you by your health care provider. Make sure you discuss any questions you have with your health care provider. Document Released: 01/16/2016 Document Revised: 05/17/2016 Document Reviewed: 01/16/2016 Elsevier Interactive Patient Education  [employer].

## 2017-12-31 ENCOUNTER — Encounter (HOSPITAL_COMMUNITY)
Admission: RE | Admit: 2017-12-31 | Discharge: 2017-12-31 | Disposition: A | Discharge status: Home / Self Care | Payer: Medicare Other | Source: Ambulatory Visit | Attending: Internal Medicine | Admitting: Internal Medicine

## 2017-12-31 ENCOUNTER — Other Ambulatory Visit: Payer: Self-pay

## 2017-12-31 ENCOUNTER — Encounter (HOSPITAL_COMMUNITY): Payer: Self-pay

## 2017-12-31 DIAGNOSIS — Z01812 Encounter for preprocedural laboratory examination: Secondary | ICD-10-CM | POA: Insufficient documentation

## 2017-12-31 LAB — BASIC METABOLIC PANEL
Anion gap: 14 (ref 5–15)
BUN: 14 mg/dL (ref 6–20)
CHLORIDE: 99 mmol/L — AB (ref 101–111)
CO2: 20 mmol/L — ABNORMAL LOW (ref 22–32)
CREATININE: 1.19 mg/dL (ref 0.61–1.24)
Calcium: 9.4 mg/dL (ref 8.9–10.3)
GFR calc Af Amer: >60 mL/min (ref >60)
GFR calc non Af Amer: >60 mL/min (ref >60)
Glucose, Bld: 244 mg/dL — ABNORMAL HIGH (ref 65–99)
POTASSIUM: 4.2 mmol/L (ref 3.5–5.1)
SODIUM: 133 mmol/L — AB (ref 135–145)

## 2018-01-01 DIAGNOSIS — Z0289 Encounter for other administrative examinations: Secondary | ICD-10-CM

## 2018-01-01 LAB — IGA: IgA: 85 mg/dL — ABNORMAL LOW (ref 90–386)

## 2018-01-01 LAB — TISSUE TRANSGLUTAMINASE, IGA: Tissue Transglutaminase Ab, IgA: <2 U/mL (ref 0–3)

## 2018-01-02 NOTE — Progress Notes (Signed)
Please let patient know his glucose is up. 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   Would recommend small bowel biopsies at time of EGD as planned.

## 2018-01-07 ENCOUNTER — Ambulatory Visit (HOSPITAL_COMMUNITY)
Admission: RE | Admit: 2018-01-07 | Discharge: 2018-01-07 | Disposition: A | Discharge status: Home / Self Care | Payer: Medicare Other | Source: Ambulatory Visit | Attending: Internal Medicine | Admitting: Internal Medicine

## 2018-01-07 ENCOUNTER — Encounter (HOSPITAL_COMMUNITY): Payer: Self-pay | Admitting: *Deleted

## 2018-01-07 ENCOUNTER — Ambulatory Visit (HOSPITAL_COMMUNITY): Payer: Medicare Other | Admitting: Anesthesiology

## 2018-01-07 ENCOUNTER — Telehealth: Payer: Self-pay

## 2018-01-07 ENCOUNTER — Encounter (HOSPITAL_COMMUNITY)
Admission: RE | Disposition: A | Discharge status: Home / Self Care | Payer: Self-pay | Source: Ambulatory Visit | Attending: Internal Medicine

## 2018-01-07 DIAGNOSIS — I252 Old myocardial infarction: Secondary | ICD-10-CM | POA: Diagnosis not present

## 2018-01-07 DIAGNOSIS — Z79899 Other long term (current) drug therapy: Secondary | ICD-10-CM | POA: Diagnosis not present

## 2018-01-07 DIAGNOSIS — F419 Anxiety disorder, unspecified: Secondary | ICD-10-CM | POA: Insufficient documentation

## 2018-01-07 DIAGNOSIS — Z955 Presence of coronary angioplasty implant and graft: Secondary | ICD-10-CM | POA: Diagnosis not present

## 2018-01-07 DIAGNOSIS — K529 Noninfective gastroenteritis and colitis, unspecified: Secondary | ICD-10-CM | POA: Diagnosis not present

## 2018-01-07 DIAGNOSIS — K219 Gastro-esophageal reflux disease without esophagitis: Secondary | ICD-10-CM | POA: Diagnosis not present

## 2018-01-07 DIAGNOSIS — Z8249 Family history of ischemic heart disease and other diseases of the circulatory system: Secondary | ICD-10-CM | POA: Insufficient documentation

## 2018-01-07 DIAGNOSIS — Z794 Long term (current) use of insulin: Secondary | ICD-10-CM | POA: Insufficient documentation

## 2018-01-07 DIAGNOSIS — Z811 Family history of alcohol abuse and dependence: Secondary | ICD-10-CM | POA: Diagnosis not present

## 2018-01-07 DIAGNOSIS — R197 Diarrhea, unspecified: Secondary | ICD-10-CM | POA: Diagnosis not present

## 2018-01-07 DIAGNOSIS — E785 Hyperlipidemia, unspecified: Secondary | ICD-10-CM | POA: Insufficient documentation

## 2018-01-07 DIAGNOSIS — I2510 Atherosclerotic heart disease of native coronary artery without angina pectoris: Secondary | ICD-10-CM | POA: Insufficient documentation

## 2018-01-07 DIAGNOSIS — R1013 Epigastric pain: Secondary | ICD-10-CM | POA: Diagnosis not present

## 2018-01-07 DIAGNOSIS — Z7982 Long term (current) use of aspirin: Secondary | ICD-10-CM | POA: Insufficient documentation

## 2018-01-07 DIAGNOSIS — K3189 Other diseases of stomach and duodenum: Secondary | ICD-10-CM | POA: Diagnosis not present

## 2018-01-07 DIAGNOSIS — E669 Obesity, unspecified: Secondary | ICD-10-CM | POA: Insufficient documentation

## 2018-01-07 DIAGNOSIS — Z86010 Personal history of colonic polyps: Secondary | ICD-10-CM | POA: Diagnosis not present

## 2018-01-07 DIAGNOSIS — K3184 Gastroparesis: Secondary | ICD-10-CM | POA: Insufficient documentation

## 2018-01-07 DIAGNOSIS — Z801 Family history of malignant neoplasm of trachea, bronchus and lung: Secondary | ICD-10-CM | POA: Insufficient documentation

## 2018-01-07 DIAGNOSIS — Z823 Family history of stroke: Secondary | ICD-10-CM | POA: Insufficient documentation

## 2018-01-07 DIAGNOSIS — I10 Essential (primary) hypertension: Secondary | ICD-10-CM | POA: Diagnosis not present

## 2018-01-07 DIAGNOSIS — Z7951 Long term (current) use of inhaled steroids: Secondary | ICD-10-CM | POA: Diagnosis not present

## 2018-01-07 DIAGNOSIS — K210 Gastro-esophageal reflux disease with esophagitis: Secondary | ICD-10-CM | POA: Insufficient documentation

## 2018-01-07 DIAGNOSIS — J449 Chronic obstructive pulmonary disease, unspecified: Secondary | ICD-10-CM | POA: Insufficient documentation

## 2018-01-07 DIAGNOSIS — F329 Major depressive disorder, single episode, unspecified: Secondary | ICD-10-CM | POA: Diagnosis not present

## 2018-01-07 DIAGNOSIS — F17210 Nicotine dependence, cigarettes, uncomplicated: Secondary | ICD-10-CM | POA: Diagnosis not present

## 2018-01-07 DIAGNOSIS — E1143 Type 2 diabetes mellitus with diabetic autonomic (poly)neuropathy: Secondary | ICD-10-CM | POA: Diagnosis not present

## 2018-01-07 DIAGNOSIS — K2950 Unspecified chronic gastritis without bleeding: Secondary | ICD-10-CM | POA: Diagnosis not present

## 2018-01-07 DIAGNOSIS — Z85820 Personal history of malignant melanoma of skin: Secondary | ICD-10-CM | POA: Diagnosis not present

## 2018-01-07 DIAGNOSIS — K2980 Duodenitis without bleeding: Secondary | ICD-10-CM | POA: Diagnosis not present

## 2018-01-07 DIAGNOSIS — R1084 Generalized abdominal pain: Secondary | ICD-10-CM

## 2018-01-07 HISTORY — PX: ESOPHAGOGASTRODUODENOSCOPY (EGD) WITH PROPOFOL: SHX5813

## 2018-01-07 HISTORY — PX: BIOPSY: SHX5522

## 2018-01-07 HISTORY — PX: FLEXIBLE SIGMOIDOSCOPY: SHX5431

## 2018-01-07 LAB — CBC WITH DIFFERENTIAL/PLATELET
BASOS PCT: 1 %
Basophils Absolute: 0 10*3/uL (ref 0.0–0.1)
Eosinophils Absolute: 0.1 10*3/uL (ref 0.0–0.7)
Eosinophils Relative: 1 %
HCT: 44.9 % (ref 39.0–52.0)
HEMOGLOBIN: 15 g/dL (ref 13.0–17.0)
Lymphocytes Relative: 37 %
Lymphs Abs: 2.3 10*3/uL (ref 0.7–4.0)
MCH: 31.2 pg (ref 26.0–34.0)
MCHC: 33.4 g/dL (ref 30.0–36.0)
MCV: 93.3 fL (ref 78.0–100.0)
Monocytes Absolute: 0.7 10*3/uL (ref 0.1–1.0)
Monocytes Relative: 11 %
NEUTROS PCT: 50 %
Neutro Abs: 3.2 10*3/uL (ref 1.7–7.7)
PLATELETS: 357 10*3/uL (ref 150–400)
RBC: 4.81 MIL/uL (ref 4.22–5.81)
RDW: 12.7 % (ref 11.5–15.5)
WBC: 6.3 10*3/uL (ref 4.0–10.5)

## 2018-01-07 LAB — GLUCOSE, CAPILLARY
GLUCOSE-CAPILLARY: 197 mg/dL — AB (ref 65–99)
Glucose-Capillary: 164 mg/dL — ABNORMAL HIGH (ref 65–99)

## 2018-01-07 SURGERY — ESOPHAGOGASTRODUODENOSCOPY (EGD) WITH PROPOFOL
Anesthesia: Monitor Anesthesia Care

## 2018-01-07 MED ORDER — PROPOFOL 10 MG/ML IV BOLUS
INTRAVENOUS | Status: AC
  Filled 2018-01-07: qty 40

## 2018-01-07 MED ORDER — LACTATED RINGERS IV SOLN: INTRAVENOUS | Status: DC

## 2018-01-07 MED ORDER — CHLORHEXIDINE GLUCONATE CLOTH 2 % EX PADS: 6.0000 | MEDICATED_PAD | Freq: Once | CUTANEOUS | Status: DC

## 2018-01-07 MED ORDER — PROPOFOL 10 MG/ML IV BOLUS
INTRAVENOUS | Status: DC | PRN
  Administered 2018-01-07 (×2): 20 mg via INTRAVENOUS

## 2018-01-07 MED ORDER — LIDOCAINE VISCOUS 2 % MT SOLN
OROMUCOSAL | Status: DC | PRN
  Administered 2018-01-07: 1 via OROMUCOSAL

## 2018-01-07 MED ORDER — PROPOFOL 500 MG/50ML IV EMUL
INTRAVENOUS | Status: DC | PRN
  Administered 2018-01-07: 200 ug/kg/min via INTRAVENOUS

## 2018-01-07 MED ORDER — LACTATED RINGERS IV SOLN
INTRAVENOUS | Status: DC | PRN
  Administered 2018-01-07: 08:00:00 via INTRAVENOUS

## 2018-01-07 NOTE — Op Note (Signed)
[facility] Patient Name: Dustin Cruz Procedure Date: 01/07/2018 9:29 AM MRN: [id number] Date of Birth: April 21, 1966 Attending MD: Norvel Richards , MD CSN: [id number] Age: 52 Admit Type: Outpatient Procedure:                Colonoscopy Indications:              Chronic diarrhea, ; history of polyps Providers:                Norvel Richards, MD, Janeece Riggers, RN, Randa Spike, Technician Referring MD:              Medicines:                Propofol per Anesthesia Complications:            No immediate complications. Estimated Blood Loss:     Estimated blood loss: none. Procedure:                Pre-Anesthesia Assessment:                           - Prior to the procedure, a History and Physical                            was performed, and patient medications and                            allergies were reviewed. The patient's tolerance of                            previous anesthesia was also reviewed. The risks                            and benefits of the procedure and the sedation                            options and risks were discussed with the patient.                            All questions were answered, and informed consent                            was obtained. Prior Anticoagulants: The patient has                            taken no previous anticoagulant or antiplatelet                            agents. ASA Grade Assessment: II - A patient with                            mild systemic disease. After reviewing the risks  and benefits, the patient was deemed in                            satisfactory condition to undergo the procedure.                           After obtaining informed consent, the colonoscope                            was passed under direct vision. Throughout the                            procedure, the patient's blood pressure, pulse, and                            oxygen  saturations were monitored continuously. The                            [id number] ([id number]) scope was introduced through                            the and advanced to the the sigmoid colon. The                            quality of the bowel preparation was inadequate.                            The rectum was photographed. Scope In: 9:31:01 AM Scope Out: 9:31:35 AM Total Procedure Duration: 0 hours 0 minutes 34 seconds  Findings:      The perianal and digital rectal examinations were normal. Patient had a       large amount of viscous and semi-formed stool in rectum and sigmid which       precluded colonoscopy today. Impression:               - Preparation of the colon was inadequate.                           - No specimens collected. examination aborted. Moderate Sedation:      Moderate (conscious) sedation was personally administered by an       anesthesia professional. The following parameters were monitored: oxygen       saturation, heart rate, blood pressure, respiratory rate, EKG, adequacy       of pulmonary ventilation, and response to care. Total physician       intraservice time was 27 minutes. Recommendation:           - Patient has a contact number available for                            emergencies. The signs and symptoms of potential                            delayed complications were discussed with the  patient. Return to normal activities tomorrow.                            Written discharge instructions were provided to the                            patient.                           - Advance diet as tolerated.                           - Repeat colonoscopy in 6 weeks because the bowel                            preparation was poor.                           - Return to GI office in 6 weeks. See EGD report Procedure Code(s):        --- Professional ---                           [phone], 53, Colonoscopy, flexible; diagnostic,                             including collection of specimen(s) by brushing or                            washing, when performed (separate procedure) Diagnosis Code(s):        --- Professional ---                           K52.9, Noninfective gastroenteritis and colitis,                            unspecified CPT copyright 2016 American Medical Association. All rights reserved. The codes documented in this report are preliminary and upon coder review may  be revised to meet current compliance requirements. Cristopher Estimable. , MD Norvel Richards, MD 01/07/2018 9:38:14 AM This report has been signed electronically. Number of Addenda: 0

## 2018-01-07 NOTE — Discharge Instructions (Signed)
°Colonoscopy °Discharge Instructions ° °Read the instructions outlined below and refer to this sheet in the next few weeks. These discharge instructions provide you with general information on caring for yourself after you leave the hospital. Your doctor may also give you specific instructions. While your treatment has been planned according to the most current medical practices available, unavoidable complications occasionally occur. If you have any problems or questions after discharge, call Dr. Rourk at [phone]. °ACTIVITY °· You may resume your regular activity, but move at a slower pace for the next 24 hours.  °· Take frequent rest periods for the next 24 hours.  °· Walking will help get rid of the air and reduce the bloated feeling in your belly (abdomen).  °· No driving for 24 hours (because of the medicine (anesthesia) used during the test).   °· Do not sign any important legal documents or operate any machinery for 24 hours (because of the anesthesia used during the test).  °NUTRITION °· Drink plenty of fluids.  °· You may resume your normal diet as instructed by your doctor.  °· Begin with a light meal and progress to your normal diet. Heavy or fried foods are harder to digest and may make you feel sick to your stomach (nauseated).  °· Avoid alcoholic beverages for 24 hours or as instructed.  °MEDICATIONS °· You may resume your normal medications unless your doctor tells you otherwise.  °WHAT YOU CAN EXPECT TODAY °· Some feelings of bloating in the abdomen.  °· Passage of more gas than usual.  °· Spotting of blood in your stool or on the toilet paper.  °IF YOU HAD POLYPS REMOVED DURING THE COLONOSCOPY: °· No aspirin products for 7 days or as instructed.  °· No alcohol for 7 days or as instructed.  °· Eat a soft diet for the next 24 hours.  °FINDING OUT THE RESULTS OF YOUR TEST °Not all test results are available during your visit. If your test results are not back during the visit, make an appointment  with your caregiver to find out the results. Do not assume everything is normal if you have not heard from your caregiver or the medical facility. It is important for you to follow up on all of your test results.  °SEEK IMMEDIATE MEDICAL ATTENTION IF: °· You have more than a spotting of blood in your stool.  °· Your belly is swollen (abdominal distention).  °· You are nauseated or vomiting.  °· You have a temperature over 101.  °· You have abdominal pain or discomfort that is severe or gets worse throughout the day.  °EGD °Discharge instructions °Please read the instructions outlined below and refer to this sheet in the next few weeks. These discharge instructions provide you with general information on caring for yourself after you leave the hospital. Your doctor may also give you specific instructions. While your treatment has been planned according to the most current medical practices available, unavoidable complications occasionally occur. If you have any problems or questions after discharge, please call your doctor. °ACTIVITY °· You may resume your regular activity but move at a slower pace for the next 24 hours.  °· Take frequent rest periods for the next 24 hours.  °· Walking will help expel (get rid of) the air and reduce the bloated feeling in your abdomen.  °· No driving for 24 hours (because of the anesthesia (medicine) used during the test).  °· You may shower.  °· Do not sign any important   legal documents or operate any machinery for 24 hours (because of the anesthesia used during the test).  NUTRITION  Drink plenty of fluids.   You may resume your normal diet.   Begin with a light meal and progress to your normal diet.   Avoid alcoholic beverages for 24 hours or as instructed by your caregiver.  MEDICATIONS  You may resume your normal medications unless your caregiver tells you otherwise.  WHAT YOU CAN EXPECT TODAY  You may experience abdominal discomfort such as a feeling of fullness  or gas pains.  FOLLOW-UP  Your doctor will discuss the results of your test with you.  SEEK IMMEDIATE MEDICAL ATTENTION IF ANY OF THE FOLLOWING OCCUR:  Excessive nausea (feeling sick to your stomach) and/or vomiting.   Severe abdominal pain and distention (swelling).   Trouble swallowing.   Temperature over 101 F (37.8 C).   Rectal bleeding or vomiting of blood.    Preparation for colonoscopy today was poor. It could not be done.  Office visit with [place] in 6 weeks to reschedule  Further recommendations to follow pending review of pathology report from stomach and duodenal biopsies from today.  Patient may resume all current medications    PATIENT INSTRUCTIONS POST-ANESTHESIA  IMMEDIATELY FOLLOWING SURGERY:  Do not drive or operate machinery for the first twenty four hours after surgery.  Do not make any important decisions for twenty four hours after surgery or while taking narcotic pain medications or sedatives.  If you develop intractable nausea and vomiting or a severe headache please notify your doctor immediately.  FOLLOW-UP:  Please make an appointment with your surgeon as instructed. You do not need to follow up with anesthesia unless specifically instructed to do so.  WOUND CARE INSTRUCTIONS (if applicable):  Keep a dry clean dressing on the anesthesia/puncture wound site if there is drainage.  Once the wound has quit draining you may leave it open to air.  Generally you should leave the bandage intact for twenty four hours unless there is drainage.  If the epidural site drains for more than 36-48 hours please call the anesthesia department.  QUESTIONS?:  Please feel free to call your physician or the hospital operator if you have any questions, and they will be happy to assist you.

## 2018-01-07 NOTE — Telephone Encounter (Signed)
-----   Message from Daneil Dolin, MD sent at 01/07/2018  9:54 AM EDT ----- Needs to be admitted overnight for TCS prep next time.

## 2018-01-07 NOTE — Op Note (Signed)
[facility] Patient Name: Dustin Cruz Procedure Date: 01/07/2018 8:59 AM MRN: [id number] Date of Birth: [birth year] Attending MD: Norvel Richards , MD CSN: [id number] Age: 52 Admit Type: Outpatient Procedure:                Upper GI endoscopy Indications:              Epigastric abdominal pain Providers:                Norvel Richards, MD, Janeece Riggers, RN, Randa Spike, Technician Referring MD:              Medicines:                Propofol per Anesthesia Complications:            No immediate complications. Estimated Blood Loss:     Estimated blood loss was minimal. Procedure:                Pre-Anesthesia Assessment:                           - Prior to the procedure, a History and Physical                            was performed, and patient medications and                            allergies were reviewed. The patient's tolerance of                            previous anesthesia was also reviewed. The risks                            and benefits of the procedure and the sedation                            options and risks were discussed with the patient.                            All questions were answered, and informed consent                            was obtained. Prior Anticoagulants: The patient has                            taken no previous anticoagulant or antiplatelet                            agents. ASA Grade Assessment: II - A patient with                            mild systemic disease. After reviewing the risks  and benefits, the patient was deemed in                            satisfactory condition to undergo the procedure.                           After obtaining informed consent, the endoscope was                            passed under direct vision. Throughout the                            procedure, the patient's blood pressure, pulse, and                            oxygen  saturations were monitored continuously. The                            [id number] ([id number]) scope was introduced through the                            and advanced to the second part of duodenum. The                            upper GI endoscopy was accomplished without                            difficulty. The patient tolerated the procedure                            well. Scope In: 9:18:04 AM Scope Out: 9:23:48 AM Total Procedure Duration: 0 hours 5 minutes 44 seconds  Findings:      The examined esophagus was normal.      Diffuse moderately erythematous mucosa was found in the entire examined       stomach. Significant amount of retained gastric contents precluded       complete examination of the stomach. This was biopsied with a cold       forceps for histology. Estimated blood loss was minimal.      Diffuse moderate mucosal changes characterized by erosion were found in       the second portion of the duodenum. Impression:               - Normal esophagus.                           - Erythematous mucosa in the stomach. Retained                            gastric contents. Incomplete examination. Biopsied.                           - Mucosal changes in the duodenum. status post                            biopsy Moderate  Sedation:      Moderate (conscious) sedation was personally administered by an       anesthesia professional. The following parameters were monitored: oxygen       saturation, heart rate, blood pressure, respiratory rate, EKG, adequacy       of pulmonary ventilation, and response to care. Total physician       intraservice time was 12 minutes. Recommendation:           - Patient has a contact number available for                            emergencies. The signs and symptoms of potential                            delayed complications were discussed with the                            patient. Return to normal activities tomorrow.                             Written discharge instructions were provided to the                            patient.                           - Resume previous diet.                           - Continue present medications.                           - Await pathology results.                           - No repeat upper endoscopy.                           - Return to GI office in 6 weeks. See colonoscopy                            report. Procedure Code(s):        --- Professional ---                           [phone], Esophagogastroduodenoscopy, flexible,                            transoral; with biopsy, single or multiple Diagnosis Code(s):        --- Professional ---                           K31.89, Other diseases of stomach and duodenum                           R10.13, Epigastric pain CPT copyright 2016 American Medical Association. All rights reserved. The codes documented in this report are  preliminary and upon coder review may  be revised to meet current compliance requirements. Cristopher Estimable. Aashir Umholtz, MD Norvel Richards, MD 01/07/2018 9:29:32 AM This report has been signed electronically. Number of Addenda: 0

## 2018-01-07 NOTE — Telephone Encounter (Signed)
Dustin Cruz please schedule OV

## 2018-01-07 NOTE — H&P (Signed)
_0 @   Primary Care Physician:  [name], DO Primary Gastroenterologist:  Dr. Gala Romney  Pre-Procedure History & Physical: HPI:  Dustin Cruz is a 52 y.o. male here for further evaluation of diarrhea and epigastric pain. History of colonic polyps. He is here for an EGD and colonoscopy. Patient denies dysphagia.  Past Medical History:  Diagnosis Date  . Anxiety   . Asthma   . COPD (chronic obstructive pulmonary disease) ([place])   . Coronary artery disease    s/p BMS to Ramus 10/2010;  Cath 01/22/12 prox 30-40% LAD, LCx ramus w/ hazy 70-80% in-stent restenosis, EF 60% treated medically  . Coronary artery disease    s/p BMS to Ramus 10/2010;  Cath 01/22/12 prox 30-40% LAD, LCx ramus w/ hazy 70-80% in-stent restenosis, EF 60% treated medically   . Depression   . Diabetes mellitus    Type 2  . Gastroparesis 05/2013  . GERD (gastroesophageal reflux disease)   . HTN (hypertension)   . Hyperlipidemia   . Hyperlipidemia   . Major depression, chronic 08/13/2012  . Melanoma ([place]) 2007   surgery at [facility], Followed by [place]  . Myocardial infarction ([place]) 2012  . Obesity   . Tobacco abuse   . Tubular adenoma     Past Surgical History:  Procedure Laterality Date  . CARDIAC CATHETERIZATION     with stent  . COLONOSCOPY N/A 01/27/2013   ASN:[id number] and colonic polyps. Tubular adenomas, poor bowel prep, one-year follow-up surveillance colonoscopy recommended  . CORONARY ANGIOPLASTY WITH STENT PLACEMENT    . ESOPHAGOGASTRODUODENOSCOPY  04/24/2012   Zeniah Briney-mild erosive reflux esophagitis,dilated w/7F Venia Minks, small HH, minimal chronic gastric/bulbar erosions(No H pylori)  . LEFT HEART CATHETERIZATION WITH CORONARY ANGIOGRAM N/A 01/22/2012   Procedure: LEFT HEART CATHETERIZATION WITH CORONARY ANGIOGRAM;  Surgeon: Jolaine Artist, MD;  Location: [facility] CATH LAB;  Service: Cardiovascular;  Laterality: N/A;  . melanoma surgery  2007   [place], removed lymph nodes under arm as well,Left abd     Prior to Admission medications   Medication Sig Start Date End Date Taking? Authorizing Provider  albuterol (PROVENTIL HFA;VENTOLIN HFA) 108 (90 Base) MCG/ACT inhaler Inhale 2 puffs into the lungs every 6 (six) hours as needed for wheezing. 01/15/17  Yes Timmothy Euler, MD  aspirin EC 81 MG tablet Take 81 mg by mouth daily.   Yes [provider]  atorvastatin (LIPITOR) 80 MG tablet Take 1 tablet (80 mg total) by mouth daily. Patient taking differently: Take 80 mg by mouth every evening.  09/26/17  Yes Timmothy Euler, MD  busPIRone (BUSPAR) 7.5 MG tablet Take 1 tablet (7.5 mg total) by mouth 2 (two) times daily. 09/21/17  Yes Timmothy Euler, MD  fenofibrate micronized (ANTARA) 130 MG capsule TAKE 1 CAPSULE BY MOUTH ONCE DAILY BEFORE BREAKFAST 11/19/17  Yes Gottschalk, Ashly M, DO  gabapentin (NEURONTIN) 800 MG tablet Take 1 tablet (800 mg total) by mouth 3 (three) times daily. 09/21/17  Yes Timmothy Euler, MD  ibuprofen (ADVIL,MOTRIN) 200 MG tablet Take 200-400 mg by mouth daily as needed for headache or moderate pain.   Yes [provider]  insulin aspart (NOVOLOG FLEXPEN) 100 UNIT/ML FlexPen Inject 10 Units into the skin 3 (three) times daily with meals. Inject up to 10 units prior to meal per sliding scale. 09/21/17  Yes Timmothy Euler, MD  Insulin Glargine (LANTUS) 100 UNIT/ML Solostar Pen Inject 40 Units into the skin at bedtime. Patient taking differently: Inject 35 Units into  the skin at bedtime.  09/21/17  Yes Timmothy Euler, MD  Linagliptin-Metformin HCl (JENTADUETO) 2.02-999 MG TABS Take 1 tablet by mouth 2 (two) times daily with a meal. 09/21/17  Yes Timmothy Euler, MD  nitroGLYCERIN (NITROSTAT) 0.4 MG SL tablet Place 1 tablet (0.4 mg total) under the tongue every 5 (five) minutes as needed. For chest pain. 08/13/12  Yes Reece Packer, NP  omeprazole (PRILOSEC) 40 MG capsule TAKE 1 CAPSULE BY MOUTH TWICE DAILY Patient taking differently:  Take 40 mg by mouth once daily 10/11/17  Yes Timmothy Euler, MD  traZODone (DESYREL) 150 MG tablet TAKE 1 TABLET BY MOUTH AT BEDTIME 08/09/17  Yes Timmothy Euler, MD  albuterol (PROVENTIL) (2.5 MG/3ML) 0.083% nebulizer solution Take 3 mLs (2.5 mg total) by nebulization every 6 (six) hours as needed for wheezing or shortness of breath. 10/15/17   Timmothy Euler, MD  furosemide (LASIX) 20 MG tablet Take 1 tablet (20 mg total) by mouth daily. Patient not taking: Reported on 12/25/2017 10/07/17   Julianne Rice, MD  glucose blood (ONE TOUCH ULTRA TEST) test strip Use to check blood sugars twice daily 10/19/17   Ronnie Doss M, DO  loratadine (CLARITIN) 10 MG tablet Take 10 mg by mouth daily as needed for allergies.    [provider]  Na Sulfate-K Sulfate-Mg Sulf 17.5-3.13-1.6 GM/177ML SOLN Take 1 kit by mouth as directed. 11/29/17   Jhada Risk, Cristopher Estimable, MD  potassium chloride (K-DUR) 10 MEQ tablet Take 1 tablet (10 mEq total) by mouth daily. Patient not taking: Reported on 12/25/2017 10/07/17   Julianne Rice, MD  sertraline (ZOLOFT) 100 MG tablet Take 1 tablet (100 mg total) by mouth daily. Patient not taking: Reported on 12/25/2017 09/21/17   Timmothy Euler, MD  metoprolol tartrate (LOPRESSOR) 25 MG tablet Take 25 mg by mouth 2 (two) times daily.    12/27/11  [provider]    Allergies as of 11/29/2017  . (No Known Allergies)    Family History  Problem Relation Age of Onset  . Stroke Mother   . Alcohol abuse Father   . Lung cancer Unknown   . Heart disease Unknown   . Other Unknown        not real familiar with family history  . Colon cancer Neg Hx   . Colon polyps Neg Hx     Social History   Socioeconomic History  . Marital status: Divorced    Spouse name: rayann Zweig  . Number of children: 0  . Years of education: Not on file  . Highest education level: Not on file  Occupational History  . Occupation: disabled  Social Needs  . Financial  resource strain: Not on file  . Food insecurity:    Worry: Not on file    Inability: Not on file  . Transportation needs:    Medical: Not on file    Non-medical: Not on file  Tobacco Use  . Smoking status: Current Every Day Smoker    Packs/day: 1.00    Years: 30.00    [place] years: 30.00    Types: Cigarettes    Start date: 10/09/1981  . Smokeless tobacco: Never Used  . Tobacco comment: 1/2 [place] daily 11/01/17  Substance and Sexual Activity  . 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  . Drug use: No  .  Sexual activity: Yes  Lifestyle  . Physical activity:    Days per week: Not on file    Minutes per session: Not on file  . Stress: Not on file  Relationships  . Social connections:    Talks on phone: Not on file    Gets together: Not on file    Attends religious service: Not on file    Active member of club or organization: Not on file    Attends meetings of clubs or organizations: Not on file    Relationship status: Not on file  . Intimate partner violence:    Fear of current or ex partner: Not on file    Emotionally abused: Not on file    Physically abused: Not on file    Forced sexual activity: Not on file  Other Topics Concern  . Not on file  Social History Narrative   Lives w/ wife    Review of Systems: See HPI, otherwise negative ROS  Physical Exam: There were no vitals taken for this visit. General:   Alert,  Well-developed, well-nourished, pleasant and cooperative in NAD Mouth:  No deformity or lesions. Neck:  Supple; no masses or thyromegaly. No significant cervical adenopathy. Lungs:  Clear throughout to auscultation.   No wheezes, crackles, or rhonchi. No acute distress. Heart:  Regular rate and rhythm; no murmurs, clicks, rubs,  or gallops. Abdomen: Non-distended, normal bowel sounds.  Soft and nontender without appreciable mass or  hepatosplenomegaly.  Pulses:  Normal pulses noted. Extremities:  Without clubbing or edema.  Impression:   Pleasant 52 year old gentleman multiple medical problems now with epigastric pain and diarrhea. History of colonic polyps. History of melanoma.  Recommendations:  I have offered the patient both an EGD and colonoscopy today.  The risks, benefits, limitations, imponderables and alternatives regarding both EGD and colonoscopy have been reviewed with the patient. Questions have been answered. All parties agreeable.      Notice: This dictation was prepared with Dragon dictation along with smaller phrase technology. Any transcriptional errors that result from this process are unintentional and may not be corrected upon review.

## 2018-01-07 NOTE — Anesthesia Preprocedure Evaluation (Signed)
Anesthesia Evaluation  Patient identified by MRN, date of birth, ID band  Reviewed: Allergy & Precautions, NPO status , Patient's Chart, lab work & pertinent test results  Airway Mallampati: II  TM Distance: <3 FB Neck ROM: Full    Dental no notable dental hx. (+) Poor Dentition, Loose, Missing, Dental Advisory Given   Pulmonary asthma , COPD, Current Smoker,    Pulmonary exam normal breath sounds clear to auscultation       Cardiovascular hypertension, + CAD, + Past MI and + Cardiac Stents  Normal cardiovascular exam Rhythm:Regular Rate:Normal     Neuro/Psych PSYCHIATRIC DISORDERS  Neuromuscular disease    GI/Hepatic GERD  ,  Endo/Other  diabetes, Insulin Dependent  Renal/GU      Musculoskeletal   Abdominal   Peds  Hematology   Anesthesia Other Findings   Reproductive/Obstetrics                             Anesthesia Physical Anesthesia Plan  ASA: III  Anesthesia Plan: MAC   Post-op Pain Management:    Induction: Intravenous  PONV Risk Score and Plan:   Airway Management Planned:   Additional Equipment:   Intra-op Plan:   Post-operative Plan:   Informed Consent: I have reviewed the patients History and Physical, chart, labs and discussed the procedure including the risks, benefits and alternatives for the proposed anesthesia with the patient or authorized representative who has indicated his/her understanding and acceptance.   Dental advisory given  Plan Discussed with: CRNA  Anesthesia Plan Comments:         Anesthesia Quick Evaluation

## 2018-01-07 NOTE — Anesthesia Postprocedure Evaluation (Signed)
Anesthesia Post Note  Patient: Dustin Cruz  Procedure(s) Performed: ESOPHAGOGASTRODUODENOSCOPY (EGD) WITH PROPOFOL (N/A ) BIOPSY FLEXIBLE SIGMOIDOSCOPY (N/A )  Patient location during evaluation: PACU Anesthesia Type: MAC Level of consciousness: awake and alert and oriented Pain management: pain level controlled Vital Signs Assessment: post-procedure vital signs reviewed and stable Respiratory status: spontaneous breathing Cardiovascular status: blood pressure returned to baseline and stable Postop Assessment: no apparent nausea or vomiting Anesthetic complications: no     Last Vitals:  Vitals:   01/07/18 1000 01/07/18 1009  BP: 133/84 132/84  Pulse: 81 82  Resp: 13 14  Temp:  36.5 C  SpO2: 97% 98%    Last Pain:  Vitals:   01/07/18 1009  TempSrc: Oral  PainSc: 0-No pain                 Mellie Buccellato

## 2018-01-07 NOTE — Transfer of Care (Signed)
Immediate Anesthesia Transfer of Care Note  Patient: Dustin Cruz  Procedure(s) Performed: ESOPHAGOGASTRODUODENOSCOPY (EGD) WITH PROPOFOL (N/A ) BIOPSY FLEXIBLE SIGMOIDOSCOPY (N/A )  Patient Location: PACU  Anesthesia Type:MAC  Level of Consciousness: sedated  Airway & Oxygen Therapy: Patient Spontanous Breathing and Patient connected to face mask oxygen  Post-op Assessment: Report given to RN  Post vital signs: Reviewed and stable  Last Vitals:  Vitals Value Taken Time  BP    Temp    Pulse    Resp    SpO2      Last Pain: There were no vitals filed for this visit.       Complications: No apparent anesthesia complications

## 2018-01-07 NOTE — Telephone Encounter (Signed)
PATIENT SCHEDULED  °

## 2018-01-09 ENCOUNTER — Encounter: Payer: Self-pay | Admitting: Internal Medicine

## 2018-01-11 ENCOUNTER — Encounter (HOSPITAL_COMMUNITY): Payer: Self-pay | Admitting: Internal Medicine

## 2018-01-26 DIAGNOSIS — M5441 Lumbago with sciatica, right side: Secondary | ICD-10-CM | POA: Diagnosis not present

## 2018-01-26 DIAGNOSIS — M5442 Lumbago with sciatica, left side: Secondary | ICD-10-CM | POA: Diagnosis not present

## 2018-02-04 ENCOUNTER — Telehealth: Payer: Self-pay | Admitting: *Deleted

## 2018-02-04 ENCOUNTER — Other Ambulatory Visit: Payer: Self-pay | Admitting: Family Medicine

## 2018-02-04 MED ORDER — BASAGLAR KWIKPEN 100 UNIT/ML ~~LOC~~ SOPN: 40.0000 [IU] | PEN_INJECTOR | Freq: Every day | SUBCUTANEOUS | 1 refills | Status: DC

## 2018-02-04 NOTE — Telephone Encounter (Signed)
I have replaced with Basaglar.  I have seen patient only for GU concern in past.  Please have him schedule a DM follow up with me soon.  Need to check A1c.

## 2018-02-04 NOTE — Telephone Encounter (Signed)
Fax received [place] non-formulary drug Basaglar, Tyler Aas, Levemir on formulary Please advise

## 2018-02-05 NOTE — Telephone Encounter (Signed)
Pt aware & appt made for 02/12/18

## 2018-02-06 NOTE — Progress Notes (Deleted)
Subjective: CC: DM2 HPI: Dustin Cruz is a 52 y.o. male presenting to clinic today for:  1. Type 2 Diabetes:  Patient reports: Glucometer:***, High at home: ***; Low at home: ***, Taking medication(s): ***, Side effects: ***  Last eye exam: Needs Last foot exam: 04/217 Last A1c: >14 in 08/217. Nephropathy screen indicated?: Yes. Had POS urine microalbumin in 01/2017. Not currently on ACE-I/ ARB Last flu, zoster and/or pneumovax: PNA and Tdap needed  ROS: denies fever, chills, dizziness, LOC, polyuria, polydipsia, unintended weight loss/gain, foot ulcerations, numbness or tingling in extremities or chest pain.   ROS: Per HPI  Past Medical History:  Diagnosis Date  . Anxiety   . Asthma   . COPD (chronic obstructive pulmonary disease) ([place])   . Coronary artery disease    s/p BMS to Ramus 10/2010;  Cath 01/22/12 prox 30-40% LAD, LCx ramus w/ hazy 70-80% in-stent restenosis, EF 60% treated medically  . Coronary artery disease    s/p BMS to Ramus 10/2010;  Cath 01/22/12 prox 30-40% LAD, LCx ramus w/ hazy 70-80% in-stent restenosis, EF 60% treated medically   . Depression   . Diabetes mellitus    Type 2  . Gastroparesis 05/2013  . GERD (gastroesophageal reflux disease)   . HTN (hypertension)   . Hyperlipidemia   . Hyperlipidemia   . Major depression, chronic 08/13/2012  . Melanoma ([place]) 2007   surgery at [facility], Followed by [facility]  . Myocardial infarction ([place]) 2012  . Obesity   . Tobacco abuse   . Tubular adenoma    No Known Allergies  Current Outpatient Medications:  .  albuterol (PROVENTIL HFA;VENTOLIN HFA) 108 (90 Base) MCG/ACT inhaler, Inhale 2 puffs into the lungs every 6 (six) hours as needed for wheezing., Disp: 1 Inhaler, Rfl: 2 .  albuterol (PROVENTIL) (2.5 MG/3ML) 0.083% nebulizer solution, Take 3 mLs (2.5 mg total) by nebulization every 6 (six) hours as needed for wheezing or shortness of breath., Disp: 75 mL, Rfl: 3 .  aspirin EC 81 MG tablet, Take 81 mg by  mouth daily., Disp: , Rfl:  .  atorvastatin (LIPITOR) 80 MG tablet, Take 1 tablet (80 mg total) by mouth daily. (Patient taking differently: Take 80 mg by mouth every evening. ), Disp: 90 tablet, Rfl: 1 .  busPIRone (BUSPAR) 7.5 MG tablet, Take 1 tablet (7.5 mg total) by mouth 2 (two) times daily., Disp: 60 tablet, Rfl: 3 .  fenofibrate micronized (ANTARA) 130 MG capsule, TAKE 1 CAPSULE BY MOUTH ONCE DAILY BEFORE BREAKFAST, Disp: 90 capsule, Rfl: 0 .  furosemide (LASIX) 20 MG tablet, Take 1 tablet (20 mg total) by mouth daily. (Patient not taking: Reported on 12/25/2017), Disp: 10 tablet, Rfl: 0 .  gabapentin (NEURONTIN) 800 MG tablet, Take 1 tablet (800 mg total) by mouth 3 (three) times daily., Disp: 90 tablet, Rfl: 11 .  glucose blood (ONE TOUCH ULTRA TEST) test strip, Use to check blood sugars twice daily, Disp: 100 each, Rfl: 5 .  ibuprofen (ADVIL,MOTRIN) 200 MG tablet, Take 200-400 mg by mouth daily as needed for headache or moderate pain., Disp: , Rfl:  .  insulin aspart (NOVOLOG FLEXPEN) 100 UNIT/ML FlexPen, Inject 10 Units into the skin 3 (three) times daily with meals. Inject up to 10 units prior to meal per sliding scale., Disp: 15 mL, Rfl: 2 .  Insulin Glargine (BASAGLAR KWIKPEN) 100 UNIT/ML SOPN, Inject 0.4 mLs (40 Units total) into the skin daily at 10 pm., Disp: 15 mL, Rfl: 1 .  Linagliptin-Metformin HCl (JENTADUETO) 2.02-999 MG TABS, Take 1 tablet by mouth 2 (two) times daily with a meal., Disp: 180 tablet, Rfl: 3 .  loratadine (CLARITIN) 10 MG tablet, Take 10 mg by mouth daily as needed for allergies., Disp: , Rfl:  .  Na Sulfate-K Sulfate-Mg Sulf 17.5-3.13-1.6 GM/177ML SOLN, Take 1 kit by mouth as directed., Disp: 1 Bottle, Rfl: 0 .  nitroGLYCERIN (NITROSTAT) 0.4 MG SL tablet, Place 1 tablet (0.4 mg total) under the tongue every 5 (five) minutes as needed. For chest pain., Disp: 25 tablet, Rfl: 3 .  omeprazole (PRILOSEC) 40 MG capsule, TAKE 1 CAPSULE BY MOUTH TWICE DAILY (Patient  taking differently: Take 40 mg by mouth once daily), Disp: 180 capsule, Rfl: 0 .  potassium chloride (K-DUR) 10 MEQ tablet, Take 1 tablet (10 mEq total) by mouth daily. (Patient not taking: Reported on 12/25/2017), Disp: 10 tablet, Rfl: 0 .  sertraline (ZOLOFT) 100 MG tablet, Take 1 tablet (100 mg total) by mouth daily. (Patient not taking: Reported on 12/25/2017), Disp: 90 tablet, Rfl: 3 .  traZODone (DESYREL) 150 MG tablet, TAKE 1 TABLET BY MOUTH AT BEDTIME, Disp: 90 tablet, Rfl: 1 Social History   Socioeconomic History  . Marital status: Divorced    Spouse name: rayann Enzor  . Number of children: 0  . Years of education: Not on file  . Highest education level: Not on file  Occupational History  . Occupation: disabled  Social Needs  . Financial resource strain: Not on file  . Food insecurity:    Worry: Not on file    Inability: Not on file  . Transportation needs:    Medical: Not on file    Non-medical: Not on file  Tobacco Use  . Smoking status: Current Every Day Smoker    Packs/day: 1.00    Years: 30.00    Commons years: 30.00    Types: Cigarettes    Start date: 10/09/1981  . Smokeless tobacco: Never Used  . Tobacco comment: 1/2 [place] daily 11/01/17  Substance and Sexual Activity  . 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  . Drug use: No  . Sexual activity: Yes  Lifestyle  . Physical activity:    Days per week: Not on file    Minutes per session: Not on file  . Stress: Not on file  Relationships  . Social connections:    Talks on phone: Not on file    Gets together: Not on file    Attends religious service: Not on file    Active member of club or organization: Not on file    Attends meetings of clubs or organizations: Not on file    Relationship status: Not on file  . Intimate partner violence:    Fear of current or ex partner: Not on file    Emotionally  abused: Not on file    Physically abused: Not on file    Forced sexual activity: Not on file  Other Topics Concern  . Not on file  Social History Narrative   Lives w/ wife   Family History  Problem Relation Age of Onset  . Stroke Mother   . Alcohol abuse Father   . Lung cancer Unknown   . Heart disease Unknown   . Other Unknown        not real familiar with family history  .  Colon cancer Neg Hx   . Colon polyps Neg Hx     Health Maintenance: ***  Objective: Office vital signs reviewed. There were no vitals taken for this visit.  Physical Examination:  General: Awake, alert, *** nourished, No acute distress HEENT: Normal    Neck: No masses palpated. No lymphadenopathy    Ears: Tympanic membranes intact, normal light reflex, no erythema, no bulging    Eyes: PERRLA, extraocular movement in tact, sclera ***    Nose: nasal turbinates moist, *** nasal discharge    Throat: moist mucus membranes, no erythema, *** tonsillar exudate.  Airway is patent Cardio: regular rate and rhythm, S1S2 heard, no murmurs appreciated Pulm: clear to auscultation bilaterally, no wheezes, rhonchi or rales; normal work of breathing on room air GI: soft, non-tender, non-distended, bowel sounds present x4, no hepatomegaly, no splenomegaly, no masses GU: external vaginal tissue ***, cervix ***, *** punctate lesions on cervix appreciated, *** discharge from cervical os, *** bleeding, *** cervical motion tenderness, *** abdominal/ adnexal masses Extremities: warm, well perfused, No edema, cyanosis or clubbing; +*** pulses bilaterally MSK: *** gait and *** station Skin: dry; intact; no rashes or lesions Neuro: *** Strength and light touch sensation grossly intact, *** DTRs ***/4  Assessment/ Plan: 52 y.o. male   No problem-specific Assessment & Plan notes found for this encounter.   [name], DO [place] [phone]

## 2018-02-12 ENCOUNTER — Ambulatory Visit: Payer: Self-pay | Admitting: Family Medicine

## 2018-02-15 ENCOUNTER — Ambulatory Visit: Payer: Medicare HMO | Admitting: Family Medicine

## 2018-02-15 ENCOUNTER — Encounter: Payer: Self-pay | Admitting: Family Medicine

## 2018-02-18 ENCOUNTER — Ambulatory Visit (INDEPENDENT_AMBULATORY_CARE_PROVIDER_SITE_OTHER): Payer: Medicare HMO | Admitting: Family Medicine

## 2018-02-18 ENCOUNTER — Encounter: Payer: Self-pay | Admitting: Family Medicine

## 2018-02-18 VITALS — BP 132/84 | HR 83 | Temp 97.8°F | Ht 74.0 in | Wt 200.0 lb

## 2018-02-18 DIAGNOSIS — I10 Essential (primary) hypertension: Secondary | ICD-10-CM | POA: Diagnosis not present

## 2018-02-18 DIAGNOSIS — E1142 Type 2 diabetes mellitus with diabetic polyneuropathy: Secondary | ICD-10-CM

## 2018-02-18 DIAGNOSIS — Z794 Long term (current) use of insulin: Secondary | ICD-10-CM | POA: Diagnosis not present

## 2018-02-18 DIAGNOSIS — E1159 Type 2 diabetes mellitus with other circulatory complications: Secondary | ICD-10-CM

## 2018-02-18 LAB — BAYER DCA HB A1C WAIVED: HB A1C (BAYER DCA - WAIVED): 10.5 % — ABNORMAL HIGH (ref <7.0)

## 2018-02-18 MED ORDER — TIZANIDINE HCL 4 MG PO TABS: 4.0000 mg | ORAL_TABLET | Freq: Three times a day (TID) | ORAL | 0 refills | Status: DC | PRN

## 2018-02-18 MED ORDER — TRAZODONE HCL 150 MG PO TABS: 150.0000 mg | ORAL_TABLET | Freq: Every day | ORAL | 1 refills | Status: DC

## 2018-02-18 MED ORDER — FENOFIBRATE MICRONIZED 130 MG PO CAPS: ORAL_CAPSULE | ORAL | 3 refills | Status: DC

## 2018-02-18 NOTE — Assessment & Plan Note (Addendum)
Blood pressure somewhat controlled.  Pending urine microalbumin, may need to add ACE inhibitor.  We will plan this at next visit.

## 2018-02-18 NOTE — Progress Notes (Signed)
Subjective: CC:DM2 HPI: Dustin Cruz is a 52 y.o. male presenting to clinic today for:  1. Type 2 Diabetes:  Patient reports: Glucometer:unknown. Needs a new one, High at home: 300-400; Low at home: 145, Taking medication(s): Basaglar (4 units, was on 40 units of Lantus previously), Novolog (3 units with meals), Side effects: none  Last eye exam: >1 year.  Will schedule Last foot exam: 04/2018 Last A1c: 08/2017 Nephropathy screen indicated?: yes but patient notes he cannot urinate. Last flu, zoster and/or pneumovax: UTD  ROS: denies fever, chills, dizziness, LOC, polyuria, polydipsia, unintended weight loss/gain, foot ulcerations, or chest pain.  Reports LE numbness or tingling in extremities     Past Medical History:  Diagnosis Date  . Anxiety   . Asthma   . COPD (chronic obstructive pulmonary disease) ([place])   . Coronary artery disease    s/p BMS to Ramus 10/2010;  Cath 01/22/12 prox 30-40% LAD, LCx ramus w/ hazy 70-80% in-stent restenosis, EF 60% treated medically  . Coronary artery disease    s/p BMS to Ramus 10/2010;  Cath 01/22/12 prox 30-40% LAD, LCx ramus w/ hazy 70-80% in-stent restenosis, EF 60% treated medically   . Depression   . Diabetes mellitus    Type 2  . Gastroparesis 05/2013  . GERD (gastroesophageal reflux disease)   . HTN (hypertension)   . Hyperlipidemia   . Hyperlipidemia   . Major depression, chronic 08/13/2012  . Melanoma ([place]) 2007   surgery at [facility], Followed by [facility]  . Myocardial infarction ([place]) 2012  . Obesity   . Tobacco abuse   . Tubular adenoma    No Known Allergies  Current Outpatient Medications:  .  albuterol (PROVENTIL HFA;VENTOLIN HFA) 108 (90 Base) MCG/ACT inhaler, Inhale 2 puffs into the lungs every 6 (six) hours as needed for wheezing., Disp: 1 Inhaler, Rfl: 2 .  albuterol (PROVENTIL) (2.5 MG/3ML) 0.083% nebulizer solution, Take 3 mLs (2.5 mg total) by nebulization every 6 (six) hours as needed for wheezing or shortness of  breath., Disp: 75 mL, Rfl: 3 .  aspirin EC 81 MG tablet, Take 81 mg by mouth daily., Disp: , Rfl:  .  atorvastatin (LIPITOR) 80 MG tablet, Take 1 tablet (80 mg total) by mouth daily. (Patient taking differently: Take 80 mg by mouth every evening. ), Disp: 90 tablet, Rfl: 1 .  busPIRone (BUSPAR) 7.5 MG tablet, Take 1 tablet (7.5 mg total) by mouth 2 (two) times daily., Disp: 60 tablet, Rfl: 3 .  fenofibrate micronized (ANTARA) 130 MG capsule, TAKE 1 CAPSULE BY MOUTH ONCE DAILY BEFORE BREAKFAST, Disp: 90 capsule, Rfl: 0 .  furosemide (LASIX) 20 MG tablet, Take 1 tablet (20 mg total) by mouth daily. (Patient not taking: Reported on 12/25/2017), Disp: 10 tablet, Rfl: 0 .  gabapentin (NEURONTIN) 800 MG tablet, Take 1 tablet (800 mg total) by mouth 3 (three) times daily., Disp: 90 tablet, Rfl: 11 .  glucose blood (ONE TOUCH ULTRA TEST) test strip, Use to check blood sugars twice daily, Disp: 100 each, Rfl: 5 .  ibuprofen (ADVIL,MOTRIN) 200 MG tablet, Take 200-400 mg by mouth daily as needed for headache or moderate pain., Disp: , Rfl:  .  insulin aspart (NOVOLOG FLEXPEN) 100 UNIT/ML FlexPen, Inject 10 Units into the skin 3 (three) times daily with meals. Inject up to 10 units prior to meal per sliding scale., Disp: 15 mL, Rfl: 2 .  Insulin Glargine (BASAGLAR KWIKPEN) 100 UNIT/ML SOPN, Inject 0.4 mLs (40 Units total) into the  skin daily at 10 pm., Disp: 15 mL, Rfl: 1 .  Linagliptin-Metformin HCl (JENTADUETO) 2.02-999 MG TABS, Take 1 tablet by mouth 2 (two) times daily with a meal., Disp: 180 tablet, Rfl: 3 .  loratadine (CLARITIN) 10 MG tablet, Take 10 mg by mouth daily as needed for allergies., Disp: , Rfl:  .  Na Sulfate-K Sulfate-Mg Sulf 17.5-3.13-1.6 GM/177ML SOLN, Take 1 kit by mouth as directed., Disp: 1 Bottle, Rfl: 0 .  nitroGLYCERIN (NITROSTAT) 0.4 MG SL tablet, Place 1 tablet (0.4 mg total) under the tongue every 5 (five) minutes as needed. For chest pain., Disp: 25 tablet, Rfl: 3 .  omeprazole  (PRILOSEC) 40 MG capsule, TAKE 1 CAPSULE BY MOUTH TWICE DAILY (Patient taking differently: Take 40 mg by mouth once daily), Disp: 180 capsule, Rfl: 0 .  potassium chloride (K-DUR) 10 MEQ tablet, Take 1 tablet (10 mEq total) by mouth daily. (Patient not taking: Reported on 12/25/2017), Disp: 10 tablet, Rfl: 0 .  sertraline (ZOLOFT) 100 MG tablet, Take 1 tablet (100 mg total) by mouth daily. (Patient not taking: Reported on 12/25/2017), Disp: 90 tablet, Rfl: 3 .  traZODone (DESYREL) 150 MG tablet, TAKE 1 TABLET BY MOUTH AT BEDTIME, Disp: 90 tablet, Rfl: 1 Social History   Socioeconomic History  . Marital status: Divorced    Spouse name: rayann Brinker  . Number of children: 0  . Years of education: Not on file  . Highest education level: Not on file  Occupational History  . Occupation: disabled  Social Needs  . Financial resource strain: Not on file  . Food insecurity:    Worry: Not on file    Inability: Not on file  . Transportation needs:    Medical: Not on file    Non-medical: Not on file  Tobacco Use  . Smoking status: Current Every Day Smoker    Packs/day: 1.00    Years: 30.00    [place] years: 30.00    Types: Cigarettes    Start date: 10/09/1981  . Smokeless tobacco: Never Used  . Tobacco comment: 1/2 Jamil daily 11/01/17  Substance and Sexual Activity  . 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  . Drug use: No  . Sexual activity: Yes  Lifestyle  . Physical activity:    Days per week: Not on file    Minutes per session: Not on file  . Stress: Not on file  Relationships  . Social connections:    Talks on phone: Not on file    Gets together: Not on file    Attends religious service: Not on file    Active member of club or organization: Not on file    Attends meetings of clubs or organizations: Not on file    Relationship status: Not on file  . Intimate partner  violence:    Fear of current or ex partner: Not on file    Emotionally abused: Not on file    Physically abused: Not on file    Forced sexual activity: Not on file  Other Topics Concern  . Not on file  Social History Narrative   Lives w/ wife   Family History  Problem Relation Age of Onset  . Stroke Mother   . Alcohol abuse Father   . Lung cancer Unknown   . Heart disease Unknown   . Other Unknown  not real familiar with family history  . Colon cancer Neg Hx   . Colon polyps Neg Hx     Health Maintenance: DM eye, Urine micro, PNA vaccine 2nd Objective: Office vital signs reviewed. BP 132/84   Pulse 83   Temp 97.8 F (36.6 C) (Oral)   Ht '6\' 2"'  (1.88 m)   Wt 200 lb (90.7 kg)   BMI 25.68 kg/m   Physical Examination:  General: Awake, alert, well nourished, chronically ill appearing, No acute distress HEENT: Normal, MMM, poor dentition Pulm: normal work of breathing on room air  Assessment/ Plan: 52 y.o. male   Diabetes mellitus, type 2 Not at goal.  A1c today was 10.5.  It appears that he has been injecting 4 units instead of 40 units of the Basaglar.  We discussed use of Basaglar and NovoLog today.  I have ordered a new glucometer with test strips and lancets.  He will monitor at minimum every morning before meals and at bedtime.  Will bring this log in in the next 2 to 3 weeks for review.  We also discussed increasing the basal insulin by 1 unit every 3 days for blood sugars greater than 160.  He will schedule his Diabetic eye exam.  Will plan for Urine microalbumin at next visit since he was unable to urinate today.  Hypertension associated with diabetes ([place]) Blood pressure somewhat controlled.  Pending urine microalbumin, may need to add ACE inhibitor.  We will plan this at next visit.   [name], DO [place] [phone]

## 2018-02-18 NOTE — Assessment & Plan Note (Signed)
Not at goal.  A1c today was 10.5.  It appears that he has been injecting 4 units instead of 40 units of the Basaglar.  We discussed use of Basaglar and NovoLog today.  I have ordered a new glucometer with test strips and lancets.  He will monitor at minimum every morning before meals and at bedtime.  Will bring this log in in the next 2 to 3 weeks for review.  We also discussed increasing the basal insulin by 1 unit every 3 days for blood sugars greater than 160.  He will schedule his Diabetic eye exam.  Will plan for Urine microalbumin at next visit since he was unable to urinate today.

## 2018-02-18 NOTE — Patient Instructions (Signed)
Remember the basiglar should be turned to 40 units at bedtime.  This is the same dose you are on of Lantus.  Monitor your blood sugars every morning before you eat.  For blood sugars greater than 160, increase your dose by 1 unit every 3 days.  Plan to see me back in the next 2 to 4 weeks with your blood sugar log.

## 2018-02-28 ENCOUNTER — Encounter: Payer: Self-pay | Admitting: Gastroenterology

## 2018-02-28 ENCOUNTER — Ambulatory Visit (INDEPENDENT_AMBULATORY_CARE_PROVIDER_SITE_OTHER): Payer: Medicare HMO | Admitting: Gastroenterology

## 2018-02-28 VITALS — BP 131/78 | HR 85 | Temp 97.7°F | Ht 74.0 in | Wt 203.2 lb

## 2018-02-28 DIAGNOSIS — R197 Diarrhea, unspecified: Secondary | ICD-10-CM

## 2018-02-28 DIAGNOSIS — Z86010 Personal history of colonic polyps: Secondary | ICD-10-CM

## 2018-02-28 NOTE — Progress Notes (Signed)
Primary Care Physician:  [name], DO  Primary Gastroenterologist:  Garfield Cornea, MD   Chief Complaint  Patient presents with  . Follow-up    needs to r/s TCS, poor prep    HPI:  Dustin Cruz is a 52 y.o. male here to reschedule colonoscopy due to poor bowel prep.  Patient reports that he drank the entire Suprep prep but several hours later threw it all up.  Suspect prep compromised due to his gastroparesis.  Patient was seen back in February for further evaluation of abdominal pain in the setting of known GERD, gastroparesis.  Also with history of poor bowel prep in 2014, suggested one-year follow-up colonoscopy but he never completed.  Patient complains of 68-month history of diarrhea most days.  Other days he has no stool, denies hard stools.  Intermittent bright red blood per rectum but no melena.  Often the diarrhea comes without notice, he does have episodes of incontinence.  Often will wake up in the morning and have had fecal incontinence during the night.  Complains of generalized abdominal pain.  Typical heartburn well controlled.  No unintentional weight loss.  No vomiting.  No dysphagia.  Recent EGD with mild gastritis, no H. pylori.  Patient was noted to have retained gastric contents, entire stomach cannot be seen.  Small bowel biopsies negative for celiac disease.  Current Outpatient Medications  Medication Sig Dispense Refill  . albuterol (PROVENTIL HFA;VENTOLIN HFA) 108 (90 Base) MCG/ACT inhaler Inhale 2 puffs into the lungs every 6 (six) hours as needed for wheezing. 1 Inhaler 2  . albuterol (PROVENTIL) (2.5 MG/3ML) 0.083% nebulizer solution Take 3 mLs (2.5 mg total) by nebulization every 6 (six) hours as needed for wheezing or shortness of breath. 75 mL 3  . aspirin EC 81 MG tablet Take 81 mg by mouth daily.    [name] atorvastatin (LIPITOR) 80 MG tablet Take 1 tablet (80 mg total) by mouth daily. (Patient taking differently: Take 80 mg by mouth every evening. ) 90  tablet 1  . busPIRone (BUSPAR) 7.5 MG tablet Take 1 tablet (7.5 mg total) by mouth 2 (two) times daily. 60 tablet 3  . fenofibrate micronized (ANTARA) 130 MG capsule TAKE 1 CAPSULE BY MOUTH ONCE DAILY BEFORE BREAKFAST 90 capsule 3  . furosemide (LASIX) 20 MG tablet Take 1 tablet (20 mg total) by mouth daily. 10 tablet 0  . gabapentin (NEURONTIN) 800 MG tablet Take 1 tablet (800 mg total) by mouth 3 (three) times daily. 90 tablet 11  . glucose blood (ONE TOUCH ULTRA TEST) test strip Use to check blood sugars twice daily 100 each 5  . ibuprofen (ADVIL,MOTRIN) 200 MG tablet Take 200-400 mg by mouth daily as needed for headache or moderate pain.    [name] insulin aspart (NOVOLOG FLEXPEN) 100 UNIT/ML FlexPen Inject 10 Units into the skin 3 (three) times daily with meals. Inject up to 10 units prior to meal per sliding scale. 15 mL 2  . Insulin Glargine (BASAGLAR KWIKPEN) 100 UNIT/ML SOPN Inject 0.4 mLs (40 Units total) into the skin daily at 10 pm. 15 mL 1  . Linagliptin-Metformin HCl (JENTADUETO) 2.02-999 MG TABS Take 1 tablet by mouth 2 (two) times daily with a meal. 180 tablet 3  . loratadine (CLARITIN) 10 MG tablet Take 10 mg by mouth daily as needed for allergies.    . nitroGLYCERIN (NITROSTAT) 0.4 MG SL tablet Place 1 tablet (0.4 mg total) under the tongue every 5 (five) minutes as needed. For chest  pain. 25 tablet 3  . omeprazole (PRILOSEC) 40 MG capsule TAKE 1 CAPSULE BY MOUTH TWICE DAILY (Patient taking differently: Take 40 mg by mouth once daily) 180 capsule 0  . potassium chloride (K-DUR) 10 MEQ tablet Take 1 tablet (10 mEq total) by mouth daily. 10 tablet 0  . sertraline (ZOLOFT) 100 MG tablet Take 1 tablet (100 mg total) by mouth daily. 90 tablet 3  . tiZANidine (ZANAFLEX) 4 MG tablet Take 1 tablet (4 mg total) by mouth every 8 (eight) hours as needed for muscle spasms. 21 tablet 0  . traZODone (DESYREL) 150 MG tablet Take 1 tablet (150 mg total) by mouth at bedtime. 90 tablet 1   No current  facility-administered medications for this visit.     Allergies as of 02/28/2018  . (No Known Allergies)    Past Medical History:  Diagnosis Date  . Anxiety   . Asthma   . COPD (chronic obstructive pulmonary disease) ([place])   . Coronary artery disease    s/p BMS to Ramus 10/2010;  Cath 01/22/12 prox 30-40% LAD, LCx ramus w/ hazy 70-80% in-stent restenosis, EF 60% treated medically  . Coronary artery disease    s/p BMS to Ramus 10/2010;  Cath 01/22/12 prox 30-40% LAD, LCx ramus w/ hazy 70-80% in-stent restenosis, EF 60% treated medically   . Depression   . Diabetes mellitus    Type 2  . Gastroparesis 05/2013  . GERD (gastroesophageal reflux disease)   . HTN (hypertension)   . Hyperlipidemia   . Hyperlipidemia   . Major depression, chronic 08/13/2012  . Melanoma ([place]) 2007   surgery at [facility], Followed by Neijstrom  . Myocardial infarction ([place]) 2012  . Obesity   . Tobacco abuse   . Tubular adenoma     Past Surgical History:  Procedure Laterality Date  . BIOPSY  01/07/2018   Procedure: BIOPSY;  Surgeon: Daneil Dolin, MD;  Location: [facility] SU[facility];  Service: Endoscopy;;  duodenal and gastric biopsy  . CARDIAC CATHETERIZATION     with stent  . COLONOSCOPY N/A 01/27/2013   [id number] and colonic polyps. Tubular adenomas, poor bowel prep, one-year follow-up surveillance colonoscopy recommended  . CORONARY ANGIOPLASTY WITH STENT PLACEMENT    . ESOPHAGOGASTRODUODENOSCOPY  04/24/2012   [place]k-mild erosive reflux esophagitis,dilated w/41F Venia Minks, small HH, minimal chronic gastric/bulbar erosions(No H pylori)  . ESOPHAGOGASTRODUODENOSCOPY (EGD) WITH PROPOFOL N/A 01/07/2018   Dr. Gala Romney: Erythematous mucosa in the stomach, retained gastric contents, incomplete exam.  Biopsied showed mild chronic gastritis but no H. pylori.  Duodenal biopsy was negative for celiac disease.  [name] FLEXIBLE SIGMOIDOSCOPY N/A 01/07/2018   Dr. Gala Romney: Incomplete colonoscopy due to inadequate bowel prep  . LEFT HEART  CATHETERIZATION WITH CORONARY ANGIOGRAM N/A 01/22/2012   Procedure: LEFT HEART CATHETERIZATION WITH CORONARY ANGIOGRAM;  Surgeon: Jolaine Artist, MD;  Location: [facility] CATH LAB;  Service: Cardiovascular;  Laterality: N/A;  . melanoma surgery  2007   [place], removed lymph nodes under arm as well,Left abd    Family History  Problem Relation Age of Onset  . Stroke Mother   . Alcohol abuse Father   . Lung cancer Unknown   . Heart disease Unknown   . Other Unknown        not real familiar with family history  . Colon cancer Neg Hx   . Colon polyps Neg Hx     Social History   Socioeconomic History  . Marital status: Divorced    Spouse name: rayann Kinder  .  Number of children: 0  . Years of education: Not on file  . Highest education level: Not on file  Occupational History  . Occupation: disabled  Social Needs  . Financial resource strain: Not on file  . Food insecurity:    Worry: Not on file    Inability: Not on file  . Transportation needs:    Medical: Not on file    Non-medical: Not on file  Tobacco Use  . Smoking status: Current Every Day Smoker    Packs/day: 1.00    Years: 30.00    [place] years: 30.00    Types: Cigarettes    Start date: 10/09/1981  . Smokeless tobacco: Never Used  . Tobacco comment: 1/2 Zee daily 11/01/17  Substance and Sexual Activity  . 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  . Drug use: No  . Sexual activity: Yes  Lifestyle  . Physical activity:    Days per week: Not on file    Minutes per session: Not on file  . Stress: Not on file  Relationships  . Social connections:    Talks on phone: Not on file    Gets together: Not on file    Attends religious service: Not on file    Active member of club or organization: Not on file    Attends meetings of clubs or organizations: Not on file    Relationship status: Not on file  . Intimate  partner violence:    Fear of current or ex partner: Not on file    Emotionally abused: Not on file    Physically abused: Not on file    Forced sexual activity: Not on file  Other Topics Concern  . Not on file  Social History Narrative   Lives w/ wife      ROS:  General: Negative for anorexia, weight loss, fever, chills, fatigue, weakness. Eyes: Negative for vision changes.  ENT: Negative for hoarseness, difficulty swallowing , nasal congestion. CV: Negative for chest pain, angina, palpitations, dyspnea on exertion, peripheral edema.  Respiratory: Negative for dyspnea at rest, dyspnea on exertion, cough, sputum, wheezing.  GI: See history of present illness. GU:  Negative for dysuria, hematuria, urinary incontinence, urinary frequency, nocturnal urination.  MS: Positive for joint pain, low back pain.  Derm: Negative for rash or itching.  Neuro: Negative for weakness, abnormal sensation, seizure, frequent headaches, memory loss, confusion.  Psych: Negative for anxiety, depression, suicidal ideation, hallucinations.  Endo: Negative for unusual weight change.  Heme: Negative for bruising or bleeding. Allergy: Negative for rash or hives.    Physical Examination:  BP 131/78   Pulse 85   Temp 97.7 F (36.5 C) (Oral)   Ht 6\' 2"  (1.88 m)   Wt 203 lb 3.2 oz (92.2 kg)   BMI 26.09 kg/m    General: Well-nourished, well-developed in no acute distress.  Head: Normocephalic, atraumatic.   Eyes: Conjunctiva pink, no icterus. Mouth: Oropharyngeal mucosa moist and pink , no lesions erythema or exudate. Neck: Supple without thyromegaly, masses, or lymphadenopathy.  Lungs: Clear to auscultation bilaterally.  Heart: Regular rate and rhythm, no murmurs rubs or gallops.  Abdomen: Bowel sounds are normal, nontender, nondistended, no hepatosplenomegaly or masses, no abdominal bruits or    hernia , no rebound or guarding.   Rectal: Not performed Extremities: No lower extremity edema. No  clubbing or deformities.  Neuro: Alert and oriented x 4 , grossly normal neurologically.  Skin: Warm and dry, no rash or jaundice.   Psych: Alert and cooperative, normal mood and affect.  Labs: Lab Results  Component Value Date   CREATININE 1.19 12/31/2017   BUN 14 12/31/2017   NA 133 (L) 12/31/2017   K 4.2 12/31/2017   CL 99 (L) 12/31/2017   CO2 20 (L) 12/31/2017   Lab Results  Component Value Date   ALT 14 (L) 10/07/2017   AST 22 10/07/2017   ALKPHOS 27 (L) 10/07/2017   BILITOT 0.5 10/07/2017   Lab Results  Component Value Date   WBC 6.3 01/07/2018   HGB 15.0 01/07/2018   HCT 44.9 01/07/2018   MCV 93.3 01/07/2018   PLT 357 01/07/2018     Imaging Studies: No results found.

## 2018-02-28 NOTE — Patient Instructions (Signed)
1. We will schedule you for a colonoscopy in the near future.  2. You will be admitted to the hospital the day before to do your bowel prep. 3. Once we have a date set, you will need to start clear liquid diet two days before your procedure date.  4. While on clear liquids, you should decrease insulin glargine to 1/2 your normal dose and jentadueto to 1/2 your normal dose.

## 2018-03-01 ENCOUNTER — Encounter: Payer: Self-pay | Admitting: Gastroenterology

## 2018-03-01 NOTE — Progress Notes (Signed)
cc'd to pcp 

## 2018-03-01 NOTE — Assessment & Plan Note (Signed)
52 year old gentleman with history of diabetes, gastroparesis, GERD, colon polyps who we saw back in February for abdominal pain and diarrhea.  Patient will overdue for colonoscopy due to somewhat poor prep in 2014, was advised to come back in 2015 for another colonoscopy but patient did not have this completed.  Recent attempted colonoscopy failed due to in adequate bowel prep.  I suspect his bowel preparation is compromised by his gastroparesis, patient reports vomiting up the bowel prep several hours after he consumed it.  He continues to complain of chronic diarrhea, never passes a solid stool or hard stool.  Intermittent bright red blood per rectum.  Dr. Gala Romney is recommended patient be hospitalized the day before his procedure to assist with bowel preparation.  We will plan on 2 full days of clear liquids, bowel prep to be performed beginning the morning he presents to the hospital.  Given significant gastroparesis, he would benefit from drinking bowel prep the day before as opposed to any the morning of his colonoscopy.

## 2018-03-06 ENCOUNTER — Encounter: Payer: Self-pay | Admitting: *Deleted

## 2018-03-12 ENCOUNTER — Ambulatory Visit: Payer: Medicare HMO | Admitting: Family Medicine

## 2018-03-12 NOTE — Progress Notes (Deleted)
Subjective: CC:DM2 HPI: Dustin Cruz is a 52 y.o. male presenting to clinic today for:  1. Type 2 Diabetes:  Patient was seen 3 weeks ago for diabetes.  At that visit, it was found that he was only injecting 4 units of the Basaglar rather than 40 units.  He had an A1c that was greater than 10, which was about a two-point rise from his previous check in November 2018.  He was instructed to resume the use of 40 units of Basaglar nightly and increase by 1 unit every 3 days for fasting blood sugars greater than 160.  Urine microalbumin was attempted but patient was unable to urinate at last visit.  Plan to obtain his today's visit.  He was to schedule his diabetic eye exam.  Patient reports: Glucometer: ***. High at home: *** ; Low at home: *** , Taking medication(s): Basaglar *** units (was on 40 units of Lantus previously), Novolog (3 units with meals), Side effects: none  Last eye exam: >1 year.  Will schedule*** Last foot exam: 04/2018 Last A1c: 02/2018 was >10 Nephropathy screen indicated?: yes but patient notes he cannot urinate. Last flu, zoster and/or pneumovax: UTD  ROS: denies fever, chills, dizziness, LOC, polyuria, polydipsia, unintended weight loss/gain, foot ulcerations, or chest pain.  Reports LE numbness or tingling in extremities ***    Past Medical History:  Diagnosis Date  . Anxiety   . Asthma   . COPD (chronic obstructive pulmonary disease) ([place])   . Coronary artery disease    s/p BMS to Ramus 10/2010;  Cath 01/22/12 prox 30-40% LAD, LCx ramus w/ hazy 70-80% in-stent restenosis, EF 60% treated medically  . Coronary artery disease    s/p BMS to Ramus 10/2010;  Cath 01/22/12 prox 30-40% LAD, LCx ramus w/ hazy 70-80% in-stent restenosis, EF 60% treated medically   . Depression   . Diabetes mellitus    Type 2  . Gastroparesis 05/2013  . GERD (gastroesophageal reflux disease)   . HTN (hypertension)   . Hyperlipidemia   . Hyperlipidemia   . Major depression, chronic  08/13/2012  . Melanoma ([place]) 2007   surgery at [facility], Followed by Neijstrom  . Myocardial infarction ([place]) 2012  . Obesity   . Tobacco abuse   . Tubular adenoma    No Known Allergies  Current Outpatient Medications:  .  albuterol (PROVENTIL HFA;VENTOLIN HFA) 108 (90 Base) MCG/ACT inhaler, Inhale 2 puffs into the lungs every 6 (six) hours as needed for wheezing., Disp: 1 Inhaler, Rfl: 2 .  albuterol (PROVENTIL) (2.5 MG/3ML) 0.083% nebulizer solution, Take 3 mLs (2.5 mg total) by nebulization every 6 (six) hours as needed for wheezing or shortness of breath., Disp: 75 mL, Rfl: 3 .  aspirin EC 81 MG tablet, Take 81 mg by mouth daily., Disp: , Rfl:  .  atorvastatin (LIPITOR) 80 MG tablet, Take 1 tablet (80 mg total) by mouth daily. (Patient taking differently: Take 80 mg by mouth every evening. ), Disp: 90 tablet, Rfl: 1 .  busPIRone (BUSPAR) 7.5 MG tablet, Take 1 tablet (7.5 mg total) by mouth 2 (two) times daily., Disp: 60 tablet, Rfl: 3 .  fenofibrate micronized (ANTARA) 130 MG capsule, TAKE 1 CAPSULE BY MOUTH ONCE DAILY BEFORE BREAKFAST, Disp: 90 capsule, Rfl: 3 .  furosemide (LASIX) 20 MG tablet, Take 1 tablet (20 mg total) by mouth daily., Disp: 10 tablet, Rfl: 0 .  gabapentin (NEURONTIN) 800 MG tablet, Take 1 tablet (800 mg total) by mouth 3 (three)  times daily., Disp: 90 tablet, Rfl: 11 .  glucose blood (ONE TOUCH ULTRA TEST) test strip, Use to check blood sugars twice daily, Disp: 100 each, Rfl: 5 .  ibuprofen (ADVIL,MOTRIN) 200 MG tablet, Take 200-400 mg by mouth daily as needed for headache or moderate pain., Disp: , Rfl:  .  insulin aspart (NOVOLOG FLEXPEN) 100 UNIT/ML FlexPen, Inject 10 Units into the skin 3 (three) times daily with meals. Inject up to 10 units prior to meal per sliding scale., Disp: 15 mL, Rfl: 2 .  Insulin Glargine (BASAGLAR KWIKPEN) 100 UNIT/ML SOPN, Inject 0.4 mLs (40 Units total) into the skin daily at 10 pm., Disp: 15 mL, Rfl: 1 .  Linagliptin-Metformin HCl  (JENTADUETO) 2.02-999 MG TABS, Take 1 tablet by mouth 2 (two) times daily with a meal., Disp: 180 tablet, Rfl: 3 .  loratadine (CLARITIN) 10 MG tablet, Take 10 mg by mouth daily as needed for allergies., Disp: , Rfl:  .  nitroGLYCERIN (NITROSTAT) 0.4 MG SL tablet, Place 1 tablet (0.4 mg total) under the tongue every 5 (five) minutes as needed. For chest pain., Disp: 25 tablet, Rfl: 3 .  omeprazole (PRILOSEC) 40 MG capsule, TAKE 1 CAPSULE BY MOUTH TWICE DAILY (Patient taking differently: Take 40 mg by mouth once daily), Disp: 180 capsule, Rfl: 0 .  potassium chloride (K-DUR) 10 MEQ tablet, Take 1 tablet (10 mEq total) by mouth daily., Disp: 10 tablet, Rfl: 0 .  sertraline (ZOLOFT) 100 MG tablet, Take 1 tablet (100 mg total) by mouth daily., Disp: 90 tablet, Rfl: 3 .  tiZANidine (ZANAFLEX) 4 MG tablet, Take 1 tablet (4 mg total) by mouth every 8 (eight) hours as needed for muscle spasms., Disp: 21 tablet, Rfl: 0 .  traZODone (DESYREL) 150 MG tablet, Take 1 tablet (150 mg total) by mouth at bedtime., Disp: 90 tablet, Rfl: 1 Social History   Socioeconomic History  . Marital status: Divorced    Spouse name: rayann Holtzer  . Number of children: 0  . Years of education: Not on file  . Highest education level: Not on file  Occupational History  . Occupation: disabled  Social Needs  . Financial resource strain: Not on file  . Food insecurity:    Worry: Not on file    Inability: Not on file  . Transportation needs:    Medical: Not on file    Non-medical: Not on file  Tobacco Use  . Smoking status: Current Every Day Smoker    Packs/day: 1.00    Years: 30.00    [place] years: 30.00    Types: Cigarettes    Start date: 10/09/1981  . Smokeless tobacco: Never Used  . Tobacco comment: 1/2 [place] daily 11/01/17  Substance and Sexual Activity  . 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  . Drug  use: No  . Sexual activity: Yes  Lifestyle  . Physical activity:    Days per week: Not on file    Minutes per session: Not on file  . Stress: Not on file  Relationships  . Social connections:    Talks on phone: Not on file    Gets together: Not on file    Attends religious service: Not on file    Active member of club or organization: Not on file    Attends meetings of clubs or organizations: Not on file  Relationship status: Not on file  . Intimate partner violence:    Fear of current or ex partner: Not on file    Emotionally abused: Not on file    Physically abused: Not on file    Forced sexual activity: Not on file  Other Topics Concern  . Not on file  Social History Narrative   Lives w/ wife   Family History  Problem Relation Age of Onset  . Stroke Mother   . Alcohol abuse Father   . Lung cancer Unknown   . Heart disease Unknown   . Other Unknown        not real familiar with family history  . Colon cancer Neg Hx   . Colon polyps Neg Hx     Health Maintenance: DM eye, Urine micro, PNA vaccine 2nd  Objective: Office vital signs reviewed. There were no vitals taken for this visit.  Physical Examination:  ***  Assessment/ Plan: 52 y.o. male   No problem-specific Assessment & Plan notes found for this encounter.   [name], DO [place] [phone]

## 2018-03-15 ENCOUNTER — Telehealth: Payer: Self-pay | Admitting: Gastroenterology

## 2018-03-15 NOTE — Telephone Encounter (Signed)
Mindy, I have called hospitalist service to discuss potential admission for bowel prep and wanting their assistance with admission. Given procedure is scheduled 5-6 weeks out they cannot proceed at this time. Suggestion was made to wait a couple weeks when we know for sure which hospitalist will be on July 17 so that I can discuss with them regarding admission.  I will work on this in a couple weeks as recommended, I will discuss with hospitalist covering that day and then we can pursue bed placement.

## 2018-03-19 ENCOUNTER — Ambulatory Visit (INDEPENDENT_AMBULATORY_CARE_PROVIDER_SITE_OTHER): Payer: Medicare HMO | Admitting: Family Medicine

## 2018-03-19 ENCOUNTER — Encounter: Payer: Self-pay | Admitting: Family Medicine

## 2018-03-19 VITALS — BP 133/79 | HR 86 | Temp 98.2°F | Ht 74.0 in | Wt 202.0 lb

## 2018-03-19 DIAGNOSIS — E1142 Type 2 diabetes mellitus with diabetic polyneuropathy: Secondary | ICD-10-CM

## 2018-03-19 DIAGNOSIS — Z794 Long term (current) use of insulin: Secondary | ICD-10-CM | POA: Diagnosis not present

## 2018-03-19 NOTE — Progress Notes (Signed)
Subjective: CC:DM2 HPI: Dustin Cruz Dustin Cruz is a 52 y.o. male presenting to clinic today for:  1. Type 2 Diabetes:  Patient was seen 3 weeks ago for diabetes.  At that visit, it was found that he was only injecting 4 units of the Basaglar rather than 40 units.  He had an A1c that was greater than 10, which was about a two-point rise from his previous check in November 2018.  He was instructed to resume the use of 40 units of Basaglar nightly and increase by 1 unit every 3 days for fasting blood sugars greater than 160.  Urine microalbumin was attempted but patient was unable to urinate at last visit.  Plan to obtain his today's visit.  He was to schedule his diabetic eye exam.  He notes he has not done this quite yet.  He thinks it actually this was done less than a year ago now.  He states that he has been working on his diet because he has one living with him and they are encouraging him to have better nutrition.  He has been avoiding sweets.  Patient reports: Glucometer: has not gotten a new one yet.  He states he has a paper from [facility] that he will bring in for this. High at home: 200s ; Low at home: 102 (but usually FBG running 150s) , Taking medication(s): Basaglar 40 units units (was on 40 units of Lantus previously), Novolog (3 units with meals), Side effects: none  Last eye exam: he thinks <1 year Last foot exam: 04/2018 Last A1c: 02/2018 was >10 Nephropathy screen indicated?: yes. Will collect today Last flu, zoster and/or pneumovax: UTD  Denies fever, chills, dizziness, LOC, polyuria, polydipsia, unintended weight loss/gain, foot ulcerations, or chest pain.  Reports LE numbness or tingling in extremities that is chronic.    Past Medical History:  Diagnosis Date  . Anxiety   . Asthma   . COPD (chronic obstructive pulmonary disease) ([place])   . Coronary artery disease    s/p BMS to Ramus 10/2010;  Cath 01/22/12 prox 30-40% LAD, LCx ramus w/ hazy 70-80% in-stent restenosis, EF 60% treated  medically  . Coronary artery disease    s/p BMS to Ramus 10/2010;  Cath 01/22/12 prox 30-40% LAD, LCx ramus w/ hazy 70-80% in-stent restenosis, EF 60% treated medically   . Depression   . Diabetes mellitus    Type 2  . Gastroparesis 05/2013  . GERD (gastroesophageal reflux disease)   . HTN (hypertension)   . Hyperlipidemia   . Hyperlipidemia   . Major depression, chronic 08/13/2012  . Melanoma ([place]) 2007   surgery at [facility], Followed by [facility]  . Myocardial infarction ([place]) 2012  . Obesity   . Tobacco abuse   . Tubular adenoma    No Known Allergies  Current Outpatient Medications:  .  albuterol (PROVENTIL HFA;VENTOLIN HFA) 108 (90 Base) MCG/ACT inhaler, Inhale 2 puffs into the lungs every 6 (six) hours as needed for wheezing., Disp: 1 Inhaler, Rfl: 2 .  albuterol (PROVENTIL) (2.5 MG/3ML) 0.083% nebulizer solution, Take 3 mLs (2.5 mg total) by nebulization every 6 (six) hours as needed for wheezing or shortness of breath., Disp: 75 mL, Rfl: 3 .  aspirin EC 81 MG tablet, Take 81 mg by mouth daily., Disp: , Rfl:  .  atorvastatin (LIPITOR) 80 MG tablet, Take 1 tablet (80 mg total) by mouth daily. (Patient taking differently: Take 80 mg by mouth every evening. ), Disp: 90 tablet, Rfl: 1 .  busPIRone (BUSPAR) 7.5 MG tablet, Take 1 tablet (7.5 mg total) by mouth 2 (two) times daily., Disp: 60 tablet, Rfl: 3 .  fenofibrate micronized (ANTARA) 130 MG capsule, TAKE 1 CAPSULE BY MOUTH ONCE DAILY BEFORE BREAKFAST, Disp: 90 capsule, Rfl: 3 .  furosemide (LASIX) 20 MG tablet, Take 1 tablet (20 mg total) by mouth daily., Disp: 10 tablet, Rfl: 0 .  gabapentin (NEURONTIN) 800 MG tablet, Take 1 tablet (800 mg total) by mouth 3 (three) times daily., Disp: 90 tablet, Rfl: 11 .  glucose blood (ONE TOUCH ULTRA TEST) test strip, Use to check blood sugars twice daily, Disp: 100 each, Rfl: 5 .  ibuprofen (ADVIL,MOTRIN) 200 MG tablet, Take 200-400 mg by mouth daily as needed for headache or moderate pain.,  Disp: , Rfl:  .  insulin aspart (NOVOLOG FLEXPEN) 100 UNIT/ML FlexPen, Inject 10 Units into the skin 3 (three) times daily with meals. Inject up to 10 units prior to meal per sliding scale., Disp: 15 mL, Rfl: 2 .  Insulin Glargine (BASAGLAR KWIKPEN) 100 UNIT/ML SOPN, Inject 0.4 mLs (40 Units total) into the skin daily at 10 pm., Disp: 15 mL, Rfl: 1 .  Linagliptin-Metformin HCl (JENTADUETO) 2.02-999 MG TABS, Take 1 tablet by mouth 2 (two) times daily with a meal., Disp: 180 tablet, Rfl: 3 .  loratadine (CLARITIN) 10 MG tablet, Take 10 mg by mouth daily as needed for allergies., Disp: , Rfl:  .  nitroGLYCERIN (NITROSTAT) 0.4 MG SL tablet, Place 1 tablet (0.4 mg total) under the tongue every 5 (five) minutes as needed. For chest pain., Disp: 25 tablet, Rfl: 3 .  omeprazole (PRILOSEC) 40 MG capsule, TAKE 1 CAPSULE BY MOUTH TWICE DAILY (Patient taking differently: Take 40 mg by mouth once daily), Disp: 180 capsule, Rfl: 0 .  potassium chloride (K-DUR) 10 MEQ tablet, Take 1 tablet (10 mEq total) by mouth daily., Disp: 10 tablet, Rfl: 0 .  sertraline (ZOLOFT) 100 MG tablet, Take 1 tablet (100 mg total) by mouth daily., Disp: 90 tablet, Rfl: 3 .  tiZANidine (ZANAFLEX) 4 MG tablet, Take 1 tablet (4 mg total) by mouth every 8 (eight) hours as needed for muscle spasms., Disp: 21 tablet, Rfl: 0 .  traZODone (DESYREL) 150 MG tablet, Take 1 tablet (150 mg total) by mouth at bedtime., Disp: 90 tablet, Rfl: 1 Social History   Socioeconomic History  . Marital status: Divorced    Spouse name: rayann Leanos  . Number of children: 0  . Years of education: Not on file  . Highest education level: Not on file  Occupational History  . Occupation: disabled  Social Needs  . Financial resource strain: Not on file  . Food insecurity:    Worry: Not on file    Inability: Not on file  . Transportation needs:    Medical: Not on file    Non-medical: Not on file  Tobacco Use  . Smoking status: Current Every Day Smoker     Packs/day: 1.00    Years: 30.00    [place] years: 30.00    Types: Cigarettes    Start date: 10/09/1981  . Smokeless tobacco: Never Used  . Tobacco comment: 1/2 [place] daily 11/01/17  Substance and Sexual Activity  . 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  . Drug use: No  . Sexual activity: Yes  Lifestyle  . Physical activity:  Days per week: Not on file    Minutes per session: Not on file  . Stress: Not on file  Relationships  . Social connections:    Talks on phone: Not on file    Gets together: Not on file    Attends religious service: Not on file    Active member of club or organization: Not on file    Attends meetings of clubs or organizations: Not on file    Relationship status: Not on file  . Intimate partner violence:    Fear of current or ex partner: Not on file    Emotionally abused: Not on file    Physically abused: Not on file    Forced sexual activity: Not on file  Other Topics Concern  . Not on file  Social History Narrative   Lives w/ wife   Family History  Problem Relation Age of Onset  . Stroke Mother   . Alcohol abuse Father   . Lung cancer Unknown   . Heart disease Unknown   . Other Unknown        not real familiar with family history  . Colon cancer Neg Hx   . Colon polyps Neg Hx     Health Maintenance: DM eye, Urine micro  Objective: Office vital signs reviewed. BP 133/79   Pulse 86   Temp 98.2 F (36.8 C) (Oral)   Ht 6\' 2"  (1.88 m)   Wt 202 lb (91.6 kg)   BMI 25.94 kg/m   Physical Examination:  General: Awake, alert, no acute distress HEENT: Moist mucous membranes Cardio: Regular rate and rhythm.  No murmurs. Pulmonary: Clear to auscultation bilaterally.  No increased work of breathing.  No wheezes.  Assessment/ Plan: 52 y.o. male   1. Type 2 diabetes mellitus with diabetic polyneuropathy, with long-term current use of  insulin (HCC) Blood sugars are running better now that he has been using Basaglar 40 units.  I have instructed him to increase by 1 unit every 3 days for fasting blood sugars greater than 120s.  He will follow-up with me in 3 months or sooner if needed.  He will schedule his diabetic eye exam.  We will obtain his urine microalbumin and plan initiation of lisinopril 5 mg pending this result.  Additionally, he is scheduled to have screening colonoscopy performed in the hospital on Monday.  We discussed that he will need a recheck BMP if we initiate ACE inhibitor.  He voiced good understanding. - Microalbumin / creatinine urine ratio   Hadli Vandemark Windell Moulding, DO [place] [phone]

## 2018-03-19 NOTE — Patient Instructions (Signed)
Increase your Basaglar by 1 unit every 3 days for morning blood sugars that are greater then 120.  Once your blood sugars are in the 120s stay at that dose.  Follow-up with me in 3 months for blood sugar recheck.  I am getting a urine micro albumin on you today.  This is looking for protein in your urine.  If it is positive, we will plan to add low-dose lisinopril.  I do want your kidney function rechecked about 3 to 5 days after starting this medicine.  Please make sure that if you are in the hospital, they recheck it while you are there.  You had labs performed today.  You will be contacted with the results of the labs once they are available, usually in the next 3 business days for routine lab work.

## 2018-03-20 ENCOUNTER — Other Ambulatory Visit: Payer: Self-pay | Admitting: Family Medicine

## 2018-03-20 LAB — MICROALBUMIN / CREATININE URINE RATIO
Creatinine, Urine: 105.6 mg/dL
MICROALBUM., U, RANDOM: 954.9 ug/mL
Microalb/Creat Ratio: 904.3 mg/g creat — ABNORMAL HIGH (ref 0.0–30.0)

## 2018-03-20 MED ORDER — LISINOPRIL 5 MG PO TABS: 5.0000 mg | ORAL_TABLET | Freq: Every day | ORAL | 1 refills | Status: DC

## 2018-03-25 ENCOUNTER — Other Ambulatory Visit: Payer: Self-pay | Admitting: *Deleted

## 2018-03-25 MED ORDER — GLUCOSE BLOOD VI STRP: ORAL_STRIP | 3 refills | Status: DC

## 2018-04-04 ENCOUNTER — Encounter: Payer: Self-pay | Admitting: *Deleted

## 2018-04-05 NOTE — Telephone Encounter (Signed)
Called spoke with patient and made aware of below recs. He voiced understanding and had no questions. Will call bed placement on 04/23/18 for bed.

## 2018-04-05 NOTE — Telephone Encounter (Signed)
Patient with gastroparesis, unsuccessful bowel preparation and failed colonoscopy in April.  Poor bowel prep in 2014 as well.  -->Make sure he has 2 days of clear liquids beginning July 16. -->Please make sure patient arrives at the hospital at 8 AM on July 17. -->Remind him to decrease insulin glargine and jentadueto to 1/2 dose on 04/23/18 and 04/24/18. -->We will begin his bowel prep as soon as he arrives at the hospital given suspected delays with his gastroparesis.  Would recommend completing entire bowel prep the day before his procedure.   Dr. Wynetta Emery has agreed to accept patient for admission. Please arrange for bed placement.   Dr. Wynetta Emery would like reminder day before regarding this admission. I will provider reminder.

## 2018-04-05 NOTE — Telephone Encounter (Signed)
According to [place], multiple conversations with bed control/hospitalist service, we now have to wait to the day before admission required to request bed placement.

## 2018-04-08 ENCOUNTER — Other Ambulatory Visit: Payer: Self-pay | Admitting: Family Medicine

## 2018-04-16 NOTE — Telephone Encounter (Signed)
Dustin Cruz had to get involved and she had to call bed placement. Per Donaciano Eva since [place] spoke with Dr. Wynetta Emery, LSL needs to call Dr. Wynetta Emery who then needs to call bed placement and inform the RN that they accepted the patient and inform them the date the patient will need to be admitted. Patient procedure is scheduled for 04/25/18 at 11:00am. Sending to [facility]

## 2018-04-17 ENCOUNTER — Other Ambulatory Visit: Payer: Self-pay | Admitting: Family Medicine

## 2018-04-17 ENCOUNTER — Telehealth: Payer: Self-pay | Admitting: *Deleted

## 2018-04-17 MED ORDER — FENOFIBRATE 145 MG PO TABS: 145.0000 mg | ORAL_TABLET | Freq: Every day | ORAL | 3 refills | Status: DC

## 2018-04-17 NOTE — Telephone Encounter (Signed)
Changed to Tricor 145mg 

## 2018-04-17 NOTE — Telephone Encounter (Signed)
Fax received [facility] Antara 130 mg cap Insurance prefers pt try tablet first Please advise / change

## 2018-04-18 NOTE — Telephone Encounter (Signed)
Pt aware of the change 

## 2018-04-22 ENCOUNTER — Encounter: Payer: Self-pay | Admitting: *Deleted

## 2018-04-22 NOTE — Telephone Encounter (Signed)
Spoke with Dr. Wynetta Emery regarding help for admission for 04/24/18 8am. Colonoscopy scheduled for 04/25/18 at 11.   He is aware of what has been requested by bed placement (per Donaciano Eva). He will contact bed placement regarding patient and let me know if any further assistance needed from our office.

## 2018-04-22 NOTE — Progress Notes (Signed)
error 

## 2018-04-24 ENCOUNTER — Observation Stay (HOSPITAL_COMMUNITY)
Admission: RE | Admit: 2018-04-24 | Discharge: 2018-04-25 | Disposition: A | Discharge status: Home / Self Care | Payer: Medicare HMO | Source: Ambulatory Visit | Attending: Family Medicine | Admitting: Family Medicine

## 2018-04-24 ENCOUNTER — Encounter (HOSPITAL_COMMUNITY): Payer: Self-pay | Admitting: *Deleted

## 2018-04-24 ENCOUNTER — Other Ambulatory Visit: Payer: Self-pay

## 2018-04-24 ENCOUNTER — Other Ambulatory Visit: Payer: Self-pay | Admitting: *Deleted

## 2018-04-24 DIAGNOSIS — Z86010 Personal history of colonic polyps: Secondary | ICD-10-CM | POA: Diagnosis not present

## 2018-04-24 DIAGNOSIS — E1159 Type 2 diabetes mellitus with other circulatory complications: Secondary | ICD-10-CM | POA: Diagnosis present

## 2018-04-24 DIAGNOSIS — I2510 Atherosclerotic heart disease of native coronary artery without angina pectoris: Secondary | ICD-10-CM | POA: Diagnosis not present

## 2018-04-24 DIAGNOSIS — K210 Gastro-esophageal reflux disease with esophagitis: Secondary | ICD-10-CM | POA: Diagnosis not present

## 2018-04-24 DIAGNOSIS — K5730 Diverticulosis of large intestine without perforation or abscess without bleeding: Secondary | ICD-10-CM | POA: Diagnosis not present

## 2018-04-24 DIAGNOSIS — I252 Old myocardial infarction: Secondary | ICD-10-CM | POA: Insufficient documentation

## 2018-04-24 DIAGNOSIS — F17210 Nicotine dependence, cigarettes, uncomplicated: Secondary | ICD-10-CM | POA: Diagnosis not present

## 2018-04-24 DIAGNOSIS — Z7982 Long term (current) use of aspirin: Secondary | ICD-10-CM | POA: Insufficient documentation

## 2018-04-24 DIAGNOSIS — I152 Hypertension secondary to endocrine disorders: Secondary | ICD-10-CM | POA: Diagnosis not present

## 2018-04-24 DIAGNOSIS — K3184 Gastroparesis: Secondary | ICD-10-CM | POA: Diagnosis not present

## 2018-04-24 DIAGNOSIS — Z85820 Personal history of malignant melanoma of skin: Secondary | ICD-10-CM | POA: Insufficient documentation

## 2018-04-24 DIAGNOSIS — E1143 Type 2 diabetes mellitus with diabetic autonomic (poly)neuropathy: Secondary | ICD-10-CM | POA: Insufficient documentation

## 2018-04-24 DIAGNOSIS — F419 Anxiety disorder, unspecified: Secondary | ICD-10-CM | POA: Insufficient documentation

## 2018-04-24 DIAGNOSIS — E785 Hyperlipidemia, unspecified: Secondary | ICD-10-CM | POA: Insufficient documentation

## 2018-04-24 DIAGNOSIS — K529 Noninfective gastroenteritis and colitis, unspecified: Secondary | ICD-10-CM | POA: Diagnosis not present

## 2018-04-24 DIAGNOSIS — R197 Diarrhea, unspecified: Secondary | ICD-10-CM | POA: Diagnosis not present

## 2018-04-24 DIAGNOSIS — I10 Essential (primary) hypertension: Secondary | ICD-10-CM

## 2018-04-24 DIAGNOSIS — E782 Mixed hyperlipidemia: Secondary | ICD-10-CM | POA: Diagnosis present

## 2018-04-24 DIAGNOSIS — E11319 Type 2 diabetes mellitus with unspecified diabetic retinopathy without macular edema: Secondary | ICD-10-CM | POA: Insufficient documentation

## 2018-04-24 DIAGNOSIS — K219 Gastro-esophageal reflux disease without esophagitis: Secondary | ICD-10-CM

## 2018-04-24 DIAGNOSIS — R739 Hyperglycemia, unspecified: Secondary | ICD-10-CM

## 2018-04-24 DIAGNOSIS — D123 Benign neoplasm of transverse colon: Secondary | ICD-10-CM | POA: Diagnosis not present

## 2018-04-24 DIAGNOSIS — Z720 Tobacco use: Secondary | ICD-10-CM

## 2018-04-24 DIAGNOSIS — E781 Pure hyperglyceridemia: Secondary | ICD-10-CM

## 2018-04-24 DIAGNOSIS — J449 Chronic obstructive pulmonary disease, unspecified: Secondary | ICD-10-CM | POA: Insufficient documentation

## 2018-04-24 DIAGNOSIS — E1165 Type 2 diabetes mellitus with hyperglycemia: Secondary | ICD-10-CM | POA: Diagnosis not present

## 2018-04-24 DIAGNOSIS — F329 Major depressive disorder, single episode, unspecified: Secondary | ICD-10-CM | POA: Insufficient documentation

## 2018-04-24 DIAGNOSIS — G63 Polyneuropathy in diseases classified elsewhere: Secondary | ICD-10-CM

## 2018-04-24 DIAGNOSIS — Z79899 Other long term (current) drug therapy: Secondary | ICD-10-CM | POA: Diagnosis not present

## 2018-04-24 DIAGNOSIS — E1142 Type 2 diabetes mellitus with diabetic polyneuropathy: Secondary | ICD-10-CM | POA: Diagnosis not present

## 2018-04-24 DIAGNOSIS — G629 Polyneuropathy, unspecified: Secondary | ICD-10-CM

## 2018-04-24 DIAGNOSIS — F17200 Nicotine dependence, unspecified, uncomplicated: Secondary | ICD-10-CM | POA: Diagnosis present

## 2018-04-24 DIAGNOSIS — E119 Type 2 diabetes mellitus without complications: Secondary | ICD-10-CM

## 2018-04-24 DIAGNOSIS — N1831 Chronic kidney disease, stage 3a: Secondary | ICD-10-CM

## 2018-04-24 DIAGNOSIS — K760 Fatty (change of) liver, not elsewhere classified: Secondary | ICD-10-CM

## 2018-04-24 DIAGNOSIS — Z794 Long term (current) use of insulin: Secondary | ICD-10-CM

## 2018-04-24 LAB — COMPREHENSIVE METABOLIC PANEL
ALK PHOS: 30 U/L — AB (ref 38–126)
ALT: 18 U/L (ref 0–44)
AST: 18 U/L (ref 15–41)
Albumin: 3.7 g/dL (ref 3.5–5.0)
Anion gap: 5 (ref 5–15)
BILIRUBIN TOTAL: 0.5 mg/dL (ref 0.3–1.2)
BUN: 13 mg/dL (ref 6–20)
CALCIUM: 9.4 mg/dL (ref 8.9–10.3)
CHLORIDE: 103 mmol/L (ref 98–111)
CO2: 32 mmol/L (ref 22–32)
CREATININE: 1.3 mg/dL — AB (ref 0.61–1.24)
Glucose, Bld: 106 mg/dL — ABNORMAL HIGH (ref 70–99)
Potassium: 4.5 mmol/L (ref 3.5–5.1)
Sodium: 140 mmol/L (ref 135–145)
TOTAL PROTEIN: 6.5 g/dL (ref 6.5–8.1)

## 2018-04-24 LAB — GLUCOSE, CAPILLARY
GLUCOSE-CAPILLARY: 90 mg/dL (ref 70–99)
GLUCOSE-CAPILLARY: 139 mg/dL — AB (ref 70–99)
Glucose-Capillary: 77 mg/dL (ref 70–99)

## 2018-04-24 LAB — CBC
HEMATOCRIT: 45.3 % (ref 39.0–52.0)
Hemoglobin: 15 g/dL (ref 13.0–17.0)
MCH: 32.3 pg (ref 26.0–34.0)
MCHC: 33.1 g/dL (ref 30.0–36.0)
MCV: 97.4 fL (ref 78.0–100.0)
Platelets: 371 10*3/uL (ref 150–400)
RBC: 4.65 MIL/uL (ref 4.22–5.81)
RDW: 14.1 % (ref 11.5–15.5)
WBC: 7 10*3/uL (ref 4.0–10.5)

## 2018-04-24 LAB — SAMPLE TO BLOOD BANK

## 2018-04-24 MED ORDER — METOCLOPRAMIDE HCL 5 MG/ML IJ SOLN
10.0000 mg | Freq: Once | INTRAMUSCULAR | Status: AC
  Administered 2018-04-24: 10 mg via INTRAVENOUS
  Filled 2018-04-24: qty 2

## 2018-04-24 MED ORDER — PEG 3350-KCL-NA BICARB-NACL 420 G PO SOLR
2000.0000 mL | Freq: Once | ORAL | Status: AC
  Administered 2018-04-24: 2000 mL via ORAL
  Filled 2018-04-24: qty 4000

## 2018-04-24 MED ORDER — ALBUTEROL SULFATE (2.5 MG/3ML) 0.083% IN NEBU: 2.5000 mg | INHALATION_SOLUTION | Freq: Four times a day (QID) | RESPIRATORY_TRACT | Status: DC | PRN

## 2018-04-24 MED ORDER — SERTRALINE HCL 50 MG PO TABS
100.0000 mg | ORAL_TABLET | Freq: Every day | ORAL | Status: DC
  Administered 2018-04-25: 100 mg via ORAL
  Filled 2018-04-24 (×2): qty 2

## 2018-04-24 MED ORDER — SODIUM CHLORIDE 0.9% FLUSH: 3.0000 mL | INTRAVENOUS | Status: DC | PRN

## 2018-04-24 MED ORDER — PEG 3350-KCL-NA BICARB-NACL 420 G PO SOLR: 2000.0000 mL | Freq: Once | ORAL | Status: DC

## 2018-04-24 MED ORDER — INSULIN GLARGINE 100 UNIT/ML ~~LOC~~ SOLN
15.0000 [IU] | Freq: Every day | SUBCUTANEOUS | Status: DC
  Filled 2018-04-24 (×2): qty 0.15

## 2018-04-24 MED ORDER — METOCLOPRAMIDE HCL 5 MG/ML IJ SOLN: 10.0000 mg | Freq: Once | INTRAMUSCULAR | Status: DC

## 2018-04-24 MED ORDER — BUSPIRONE HCL 5 MG PO TABS
7.5000 mg | ORAL_TABLET | Freq: Two times a day (BID) | ORAL | Status: DC
  Administered 2018-04-24 – 2018-04-25 (×2): 7.5 mg via ORAL
  Filled 2018-04-24 (×2): qty 2

## 2018-04-24 MED ORDER — SODIUM CHLORIDE 0.9% FLUSH
3.0000 mL | Freq: Two times a day (BID) | INTRAVENOUS | Status: DC
  Administered 2018-04-24 – 2018-04-25 (×3): 3 mL via INTRAVENOUS

## 2018-04-24 MED ORDER — GABAPENTIN 400 MG PO CAPS
800.0000 mg | ORAL_CAPSULE | Freq: Three times a day (TID) | ORAL | Status: DC
  Administered 2018-04-24 – 2018-04-25 (×3): 800 mg via ORAL
  Filled 2018-04-24 (×3): qty 2

## 2018-04-24 MED ORDER — TRAZODONE HCL 50 MG PO TABS
150.0000 mg | ORAL_TABLET | Freq: Every day | ORAL | Status: DC
  Administered 2018-04-24: 150 mg via ORAL
  Filled 2018-04-24: qty 3

## 2018-04-24 MED ORDER — SODIUM CHLORIDE 0.9 % IV SOLN: 250.0000 mL | INTRAVENOUS | Status: DC | PRN

## 2018-04-24 MED ORDER — ACETAMINOPHEN 650 MG RE SUPP: 650.0000 mg | Freq: Four times a day (QID) | RECTAL | Status: DC | PRN

## 2018-04-24 MED ORDER — NICOTINE 21 MG/24HR TD PT24
21.0000 mg | MEDICATED_PATCH | Freq: Every day | TRANSDERMAL | Status: DC
  Administered 2018-04-24: 21 mg via TRANSDERMAL
  Filled 2018-04-24 (×2): qty 1

## 2018-04-24 MED ORDER — BISACODYL 5 MG PO TBEC
15.0000 mg | DELAYED_RELEASE_TABLET | Freq: Once | ORAL | Status: AC
  Administered 2018-04-24: 15 mg via ORAL
  Filled 2018-04-24: qty 3

## 2018-04-24 MED ORDER — PEG 3350-KCL-NA BICARB-NACL 420 G PO SOLR
2000.0000 mL | Freq: Once | ORAL | Status: AC
  Administered 2018-04-24: 2000 mL via ORAL

## 2018-04-24 MED ORDER — INSULIN ASPART 100 UNIT/ML ~~LOC~~ SOLN
0.0000 [IU] | Freq: Three times a day (TID) | SUBCUTANEOUS | Status: DC
  Administered 2018-04-25: 1 [IU] via SUBCUTANEOUS

## 2018-04-24 MED ORDER — NITROGLYCERIN 0.4 MG SL SUBL: 0.4000 mg | SUBLINGUAL_TABLET | SUBLINGUAL | Status: DC | PRN

## 2018-04-24 MED ORDER — PNEUMOCOCCAL VAC POLYVALENT 25 MCG/0.5ML IJ INJ: 0.5000 mL | INJECTION | INTRAMUSCULAR | Status: DC

## 2018-04-24 MED ORDER — ATORVASTATIN CALCIUM 40 MG PO TABS
80.0000 mg | ORAL_TABLET | Freq: Every evening | ORAL | Status: DC
Start: 2018-04-25 — End: 2018-04-25

## 2018-04-24 MED ORDER — INSULIN GLARGINE 100 UNIT/ML ~~LOC~~ SOLN
20.0000 [IU] | Freq: Every day | SUBCUTANEOUS | Status: DC
  Filled 2018-04-24: qty 0.2

## 2018-04-24 MED ORDER — PANTOPRAZOLE SODIUM 40 MG PO TBEC
40.0000 mg | DELAYED_RELEASE_TABLET | Freq: Every day | ORAL | Status: DC
  Administered 2018-04-24: 40 mg via ORAL
  Filled 2018-04-24: qty 1

## 2018-04-24 MED ORDER — ONDANSETRON HCL 4 MG/2ML IJ SOLN: 4.0000 mg | Freq: Four times a day (QID) | INTRAMUSCULAR | Status: DC | PRN

## 2018-04-24 MED ORDER — ACETAMINOPHEN 325 MG PO TABS: 650.0000 mg | ORAL_TABLET | Freq: Four times a day (QID) | ORAL | Status: DC | PRN

## 2018-04-24 MED ORDER — ONDANSETRON HCL 4 MG PO TABS: 4.0000 mg | ORAL_TABLET | Freq: Four times a day (QID) | ORAL | Status: DC | PRN

## 2018-04-24 NOTE — H&P (Signed)
History and Physical  Dustin Cruz GXQ:[id number] DOB: [birth year] DOA: 04/24/2018  PCP: [name], DO   Chief Complaint: History of colon polyps   HPI: Dustin Cruz is a 52 y.o. male smoker with type 2 DM and history of colon polyps who presented for direct admission today for planned colon survey scheduled for tomorrow.  The patient has a history of GERD, gastroparesis, hepatic steatosis and colonoscopy was attempted in 2014 but never completed secondary to poor prep.  He is presenting today with complaints of intermittent chronic diarrhea and intermittent bright red blood per rectum.  He denies melena.  He reports that he has been complaining of diarrhea for the last 9 months.  He also has symptoms of acid reflux.  He takes medications for that.  He is on insulin for his diabetes and has intermittent hyperglycemia.  The patient was seen by GI recently and had a EGD done with mild gastritis and no H. pylori was found.  They had made arrangements for him to be inpatient for the bowel prep to ensure that he does a complete thorough colon prep for the procedure scheduled for tomorrow.  Review of Systems: All systems reviewed and apart from history of presenting illness, are negative.  Past Medical History:  Diagnosis Date  . Anxiety   . Asthma   . COPD (chronic obstructive pulmonary disease) ([place])   . Coronary artery disease    s/p BMS to Ramus 10/2010;  Cath 01/22/12 prox 30-40% LAD, LCx ramus w/ hazy 70-80% in-stent restenosis, EF 60% treated medically  . Coronary artery disease    s/p BMS to Ramus 10/2010;  Cath 01/22/12 prox 30-40% LAD, LCx ramus w/ hazy 70-80% in-stent restenosis, EF 60% treated medically   . Depression   . Diabetes mellitus    Type 2  . Gastroparesis 05/2013  . GERD (gastroesophageal reflux disease)   . HTN (hypertension)   . Hyperlipidemia   . Hyperlipidemia   . Major depression, chronic 08/13/2012  . Melanoma ([place]) 2007   surgery at [facility], Followed by  [facility]  . Myocardial infarction ([place]) 2012  . Obesity   . Tobacco abuse   . Tubular adenoma    Past Surgical History:  Procedure Laterality Date  . BIOPSY  01/07/2018   Procedure: BIOPSY;  Surgeon: Daneil Dolin, MD;  Location: [facility];  Service: Endoscopy;;  duodenal and gastric biopsy  . CARDIAC CATHETERIZATION     with stent  . COLONOSCOPY N/A 01/27/2013   XKG:YJEHUD and colonic polyps. Tubular adenomas, poor bowel prep, one-year follow-up surveillance colonoscopy recommended  . CORONARY ANGIOPLASTY WITH STENT PLACEMENT    . ESOPHAGOGASTRODUODENOSCOPY  04/24/2012   [place]-mild erosive reflux esophagitis,dilated w/67F Venia Minks, small HH, minimal chronic gastric/bulbar erosions(No H pylori)  . ESOPHAGOGASTRODUODENOSCOPY (EGD) WITH PROPOFOL N/A 01/07/2018   Dr. Gala Romney: Erythematous mucosa in the stomach, retained gastric contents, incomplete exam.  Biopsied showed mild chronic gastritis but no H. pylori.  Duodenal biopsy was negative for celiac disease.  [name] FLEXIBLE SIGMOIDOSCOPY N/A 01/07/2018   Dr. Gala Romney: Incomplete colonoscopy due to inadequate bowel prep  . LEFT HEART CATHETERIZATION WITH CORONARY ANGIOGRAM N/A 01/22/2012   Procedure: LEFT HEART CATHETERIZATION WITH CORONARY ANGIOGRAM;  Surgeon: Jolaine Artist, MD;  Location: [facility] CATH LAB;  Service: Cardiovascular;  Laterality: N/A;  . melanoma surgery  2007   [place], removed lymph nodes under arm as well,Left abd   Social History:  reports that he has been smoking cigarettes.  He started smoking about 36 years ago. He has a 30.00 [place]-year smoking history. He has never used smokeless tobacco. He reports that he does not drink alcohol or use drugs.  No Known Allergies  Family History  Problem Relation Age of Onset  . Stroke Mother   . Alcohol abuse Father   . Lung cancer Unknown   . Heart disease Unknown   . Other Unknown        not real familiar with family history  . Colon cancer Neg Hx   . Colon polyps Neg Hx     Prior  to Admission medications   Medication Sig Start Date End Date Taking? Authorizing Provider  albuterol (PROVENTIL HFA;VENTOLIN HFA) 108 (90 Base) MCG/ACT inhaler Inhale 2 puffs into the lungs every 6 (six) hours as needed for wheezing. 01/15/17  Yes Timmothy Euler, MD  albuterol (PROVENTIL) (2.5 MG/3ML) 0.083% nebulizer solution Take 3 mLs (2.5 mg total) by nebulization every 6 (six) hours as needed for wheezing or shortness of breath. 10/15/17  Yes Timmothy Euler, MD  aspirin EC 81 MG tablet Take 81 mg by mouth daily.   Yes [provider]  atorvastatin (LIPITOR) 80 MG tablet Take 1 tablet (80 mg total) by mouth daily. Patient taking differently: Take 80 mg by mouth every evening.  09/26/17  Yes Timmothy Euler, MD  busPIRone (BUSPAR) 7.5 MG tablet TAKE 1 TABLET BY MOUTH TWICE DAILY 04/09/18  Yes Gottschalk, Ashly M, DO  fenofibrate (TRICOR) 145 MG tablet Take 1 tablet (145 mg total) by mouth daily. 04/17/18  Yes Gottschalk, Leatrice Jewels M, DO  furosemide (LASIX) 20 MG tablet Take 1 tablet (20 mg total) by mouth daily. 10/07/17  Yes Julianne Rice, MD  gabapentin (NEURONTIN) 800 MG tablet Take 1 tablet (800 mg total) by mouth 3 (three) times daily. 09/21/17  Yes Timmothy Euler, MD  ibuprofen (ADVIL,MOTRIN) 200 MG tablet Take 200-400 mg by mouth daily as needed for headache or moderate pain.   Yes [provider]  insulin aspart (NOVOLOG FLEXPEN) 100 UNIT/ML FlexPen Inject 10 Units into the skin 3 (three) times daily with meals. Inject up to 10 units prior to meal per sliding scale. 09/21/17  Yes Timmothy Euler, MD  Insulin Glargine (BASAGLAR KWIKPEN) 100 UNIT/ML SOPN Inject 0.4 mLs (40 Units total) into the skin daily at 10 pm. 02/04/18  Yes Ronnie Doss M, DO  Linagliptin-Metformin HCl (JENTADUETO) 2.02-999 MG TABS Take 1 tablet by mouth 2 (two) times daily with a meal. 09/21/17  Yes Timmothy Euler, MD  lisinopril (PRINIVIL,ZESTRIL) 5 MG tablet Take 1 tablet (5 mg  total) by mouth daily. Patient taking differently: Take 5 mg by mouth at bedtime.  03/20/18  Yes Gottschalk, Leatrice Jewels M, DO  loratadine (CLARITIN) 10 MG tablet Take 10 mg by mouth daily as needed for allergies.   Yes [provider]  nitroGLYCERIN (NITROSTAT) 0.4 MG SL tablet Place 1 tablet (0.4 mg total) under the tongue every 5 (five) minutes as needed. For chest pain. 08/13/12  Yes Reece Packer, NP  omeprazole (PRILOSEC) 40 MG capsule TAKE 1 CAPSULE BY MOUTH TWICE DAILY Patient taking differently: TAKE 1 CAPSULE BY MOUTH at bedtime 04/09/18  Yes Gottschalk, Ashly M, DO  potassium chloride (K-DUR) 10 MEQ tablet Take 1 tablet (10 mEq total) by mouth daily. 10/07/17  Yes Julianne Rice, MD  sertraline (ZOLOFT) 100 MG tablet Take 1 tablet (100 mg total) by mouth daily. 09/21/17  Yes Timmothy Euler, MD  tiZANidine (ZANAFLEX) 4 MG tablet Take 1 tablet (4 mg total) by mouth every 8 (eight) hours as needed for muscle spasms. 02/18/18  Yes Ronnie Doss M, DO  traZODone (DESYREL) 150 MG tablet Take 1 tablet (150 mg total) by mouth at bedtime. 02/18/18  Yes Ronnie Doss M, DO  metoprolol tartrate (LOPRESSOR) 25 MG tablet Take 25 mg by mouth 2 (two) times daily.    12/27/11  [provider]   Physical Exam: Vitals:   04/24/18 0842  BP: 121/81  Pulse: 82  Resp: 20  Temp: 98.3 F (36.8 C)  TempSrc: Oral  SpO2: 99%  Weight: 92.8 kg (204 lb 8 oz)    General exam: Moderately built and nourished patient, lying comfortably supine on the gurney in no obvious distress.  Smell of tobacco.  Head, eyes and ENT: Nontraumatic and normocephalic. Pupils equally reacting to light and accommodation. Oral mucosa moist.  Poor dentition.  Neck: Supple. No JVD, carotid bruit or thyromegaly.  Lymphatics: No lymphadenopathy.  Respiratory system: Clear to auscultation. No increased work of breathing.  Cardiovascular system: S1 and S2 heard, RRR. No JVD, murmurs, gallops, clicks or pedal  edema.  Gastrointestinal system: Abdomen is nondistended, soft and nontender. Normal bowel sounds heard. No organomegaly or masses appreciated.  Central nervous system: Alert and oriented. No focal neurological deficits.  Extremities: Symmetric 5 x 5 power. Peripheral pulses symmetrically felt.   Skin: No rashes or acute findings.  Musculoskeletal system: Negative exam.  Symmetric pedal pulses bilateral.  Psychiatry: Pleasant and cooperative.  Labs on Admission:  Basic Metabolic Panel: No results for input(s): NA, K, CL, CO2, GLUCOSE, BUN, CREATININE, CALCIUM, MG, PHOS in the last 168 hours. Liver Function Tests: No results for input(s): AST, ALT, ALKPHOS, BILITOT, PROT, ALBUMIN in the last 168 hours. No results for input(s): LIPASE, AMYLASE in the last 168 hours. No results for input(s): AMMONIA in the last 168 hours. CBC: No results for input(s): WBC, NEUTROABS, HGB, HCT, MCV, PLT in the last 168 hours. Cardiac Enzymes: No results for input(s): CKTOTAL, CKMB, CKMBINDEX, TROPONINI in the last 168 hours.  BNP (last 3 results) No results for input(s): PROBNP in the last 8760 hours. CBG: No results for input(s): GLUCAP in the last 168 hours.  Radiological Exams on Admission: No results found.  Assessment/Plan Principal Problem:   History of colonic polyps Active Problems:   Arteriosclerotic cardiovascular disease (ASCVD)   Diabetes mellitus, type 2 (HCC)   Tobacco abuse   GERD (gastroesophageal reflux disease)   Hyperlipidemia   Hypertension associated with diabetes ([place])   Peripheral neuropathy   Hepatic steatosis   Hypertriglyceridemia   Diabetic retinopathy ([place])   Hyperglycemia  1. Chronic diarrhea with history of colon polyps and gastroparesis -patient is being admitted so that he can have a thorough colon prep performed.  GI has been consulted and they will make arrangements for his colon prep.  Patient will be n.p.o. after midnight.  They are planning to keep  him on clear liquids throughout the day.  We will plan on reducing his insulin doses by 50%.  His Lantus dose has been reduced from 40 units to 20 units.  Sensitive sliding scale coverage ordered as well. 2. Tobacco abuse-patient is a heavy smoker and is craving cigarettes at this time.  I have ordered a 21 mg nicotine patch to use in the hospital. 3. Type 2 diabetes mellitus with peripheral vascular disease, insulin requiring- I am reducing his home insulin doses.  His Lantus has  been reduced to 20 units nightly.  Sensitive sliding scale coverage ordered.  I have asked for no concentrated sweets or fruit juices to be given as part of a clear liquid diet except to treat a low blood glucose. 4. Hypertension-resume home medications. 5. Dyslipidemia-diabetic, resume home medications. 6. GERD-Protonix ordered for GI protection. 7. ASCVD-stable on home meds.  DVT Prophylaxis: SCDs Code Status: Full Family Communication: Patient at bedside Disposition Plan: Home tomorrow  Time spent: 64 minutes  Irwin Brakeman, MD [facility] Pager [phone]  If 7PM-7AM, please contact night-coverage [email or web address] Password [facility] 04/24/2018, 9:44 AM

## 2018-04-24 NOTE — Consult Note (Signed)
Referring Provider: Murlean Iba, MD Primary Care Physician:  [name], DO Primary Gastroenterologist:  Garfield Cornea, MD  Reason for Consultation:  Bowel prep for colonoscopy  HPI: Dustin Cruz is a 52 y.o. male presents to hospital per our recommendations to assist with bowel preparation for colonoscopy. He has h/o poor bowel prep in 2014. Was supposed to have f/u colonoscopy one year later but he never completed. In 11/2017 he was seen for upper abd pain, gerd, gastroparesis. Also with diarrhea greater than 6 months, intermittently. Some brbpr. EGD with erythematous mucosa in the stomach, retained gastric contents, incomplete exam. Small bowel bx negative for celiac. Colonoscopy attempted but could not be performed due to poor bowel prep. Patient reported vomiting with suprep several hours after he drank it.   Continues with intermittent diarrhea with urgency and incontinence at times especially while asleep. Some days he has no stools. No hard stool though. Intermittent brbpr but no melena. Heartburn controlled. Complains of generalized abd pain, "sore". Not necessarily related to meals. No vomiting or weight loss. No dysphagia.    Prior to Admission medications   Medication Sig Start Date End Date Taking? Authorizing Provider  albuterol (PROVENTIL HFA;VENTOLIN HFA) 108 (90 Base) MCG/ACT inhaler Inhale 2 puffs into the lungs every 6 (six) hours as needed for wheezing. 01/15/17  Yes Timmothy Euler, MD  albuterol (PROVENTIL) (2.5 MG/3ML) 0.083% nebulizer solution Take 3 mLs (2.5 mg total) by nebulization every 6 (six) hours as needed for wheezing or shortness of breath. 10/15/17  Yes Timmothy Euler, MD  aspirin EC 81 MG tablet Take 81 mg by mouth daily.   Yes [provider]  atorvastatin (LIPITOR) 80 MG tablet Take 1 tablet (80 mg total) by mouth daily. Patient taking differently: Take 80 mg by mouth every evening.  09/26/17  Yes Timmothy Euler, MD  busPIRone  (BUSPAR) 7.5 MG tablet TAKE 1 TABLET BY MOUTH TWICE DAILY 04/09/18  Yes Gottschalk, Ashly M, DO  fenofibrate (TRICOR) 145 MG tablet Take 1 tablet (145 mg total) by mouth daily. 04/17/18  Yes Gottschalk, Leatrice Jewels M, DO  furosemide (LASIX) 20 MG tablet Take 1 tablet (20 mg total) by mouth daily. 10/07/17  Yes Julianne Rice, MD  gabapentin (NEURONTIN) 800 MG tablet Take 1 tablet (800 mg total) by mouth 3 (three) times daily. 09/21/17  Yes Timmothy Euler, MD  ibuprofen (ADVIL,MOTRIN) 200 MG tablet Take 200-400 mg by mouth daily as needed for headache or moderate pain.   Yes [provider]  insulin aspart (NOVOLOG FLEXPEN) 100 UNIT/ML FlexPen Inject 10 Units into the skin 3 (three) times daily with meals. Inject up to 10 units prior to meal per sliding scale. 09/21/17  Yes Timmothy Euler, MD  Insulin Glargine (BASAGLAR KWIKPEN) 100 UNIT/ML SOPN Inject 0.4 mLs (40 Units total) into the skin daily at 10 pm. 02/04/18  Yes Ronnie Doss M, DO  Linagliptin-Metformin HCl (JENTADUETO) 2.02-999 MG TABS Take 1 tablet by mouth 2 (two) times daily with a meal. 09/21/17  Yes Timmothy Euler, MD  lisinopril (PRINIVIL,ZESTRIL) 5 MG tablet Take 1 tablet (5 mg total) by mouth daily. Patient taking differently: Take 5 mg by mouth at bedtime.  03/20/18  Yes Gottschalk, Leatrice Jewels M, DO  loratadine (CLARITIN) 10 MG tablet Take 10 mg by mouth daily as needed for allergies.   Yes [provider]  nitroGLYCERIN (NITROSTAT) 0.4 MG SL tablet Place 1 tablet (0.4 mg total) under the tongue every 5 (five)  minutes as needed. For chest pain. 08/13/12  Yes Reece Packer, NP  omeprazole (PRILOSEC) 40 MG capsule TAKE 1 CAPSULE BY MOUTH TWICE DAILY Patient taking differently: TAKE 1 CAPSULE BY MOUTH at bedtime 04/09/18  Yes Gottschalk, Ashly M, DO  potassium chloride (K-DUR) 10 MEQ tablet Take 1 tablet (10 mEq total) by mouth daily. 10/07/17  Yes Julianne Rice, MD  sertraline (ZOLOFT) 100 MG tablet Take 1  tablet (100 mg total) by mouth daily. 09/21/17  Yes Timmothy Euler, MD  tiZANidine (ZANAFLEX) 4 MG tablet Take 1 tablet (4 mg total) by mouth every 8 (eight) hours as needed for muscle spasms. 02/18/18  Yes Ronnie Doss M, DO  traZODone (DESYREL) 150 MG tablet Take 1 tablet (150 mg total) by mouth at bedtime. 02/18/18  Yes Ronnie Doss M, DO  metoprolol tartrate (LOPRESSOR) 25 MG tablet Take 25 mg by mouth 2 (two) times daily.    12/27/11  [provider]    Current Facility-Administered Medications  Medication Dose Route Frequency Provider Last Rate Last Dose  . 0.9 %  sodium chloride infusion  250 mL Intravenous PRN Johnson, Clanford L, MD      . acetaminophen (TYLENOL) tablet 650 mg  650 mg Oral Q6H PRN Johnson, Clanford L, MD       Or  . acetaminophen (TYLENOL) suppository 650 mg  650 mg Rectal Q6H PRN Johnson, Clanford L, MD      . albuterol (PROVENTIL) (2.5 MG/3ML) 0.083% nebulizer solution 2.5 mg  2.5 mg Nebulization Q6H PRN Wynetta Emery, Clanford L, MD      . Derrill Memo ON 04/25/2018] atorvastatin (LIPITOR) tablet 80 mg  80 mg Oral QPM Johnson, Clanford L, MD      . busPIRone (BUSPAR) tablet 7.5 mg  7.5 mg Oral BID Johnson, Clanford L, MD      . gabapentin (NEURONTIN) capsule 800 mg  800 mg Oral TID Johnson, Clanford L, MD      . insulin aspart (novoLOG) injection 0-9 Units  0-9 Units Subcutaneous TID WC Johnson, Clanford L, MD      . insulin glargine (LANTUS) injection 15 Units  15 Units Subcutaneous Q2200 Johnson, Clanford L, MD      . metoCLOPramide (REGLAN) injection 10 mg  10 mg Intravenous Once Mahala Menghini, PA-C      . nicotine (NICODERM CQ - dosed in mg/24 hours) patch 21 mg  21 mg Transdermal Daily Johnson, Clanford L, MD   21 mg at 04/24/18 1146  . nitroGLYCERIN (NITROSTAT) SL tablet 0.4 mg  0.4 mg Sublingual Q5 min PRN Johnson, Clanford L, MD      . ondansetron (ZOFRAN) tablet 4 mg  4 mg Oral Q6H PRN Johnson, Clanford L, MD       Or  . ondansetron (ZOFRAN)  injection 4 mg  4 mg Intravenous Q6H PRN Johnson, Clanford L, MD      . pantoprazole (PROTONIX) EC tablet 40 mg  40 mg Oral QHS Johnson, Clanford L, MD      . Derrill Memo ON 04/25/2018] pneumococcal 23 valent vaccine (PNU-IMMUNE) injection 0.5 mL  0.5 mL Intramuscular Tomorrow-1000 Johnson, Clanford L, MD      . polyethylene glycol-electrolytes (NuLYTELY/GoLYTELY) solution 2,000 mL  2,000 mL Oral Once Mahala Menghini, PA-C      . sertraline (ZOLOFT) tablet 100 mg  100 mg Oral Daily Johnson, Clanford L, MD      . sodium chloride flush (NS) 0.9 % injection 3 mL  3 mL Intravenous Q12H Johnson,  Clanford L, MD   3 mL at 04/24/18 1147  . sodium chloride flush (NS) 0.9 % injection 3 mL  3 mL Intravenous PRN Johnson, Clanford L, MD      . traZODone (DESYREL) tablet 150 mg  150 mg Oral QHS Johnson, Clanford L, MD        Allergies as of 04/22/2018  . (No Known Allergies)    Past Medical History:  Diagnosis Date  . Anxiety   . Asthma   . COPD (chronic obstructive pulmonary disease) ([place])   . Coronary artery disease    s/p BMS to Ramus 10/2010;  Cath 01/22/12 prox 30-40% LAD, LCx ramus w/ hazy 70-80% in-stent restenosis, EF 60% treated medically  . Coronary artery disease    s/p BMS to Ramus 10/2010;  Cath 01/22/12 prox 30-40% LAD, LCx ramus w/ hazy 70-80% in-stent restenosis, EF 60% treated medically   . Depression   . Diabetes mellitus    Type 2  . Gastroparesis 05/2013  . GERD (gastroesophageal reflux disease)   . HTN (hypertension)   . Hyperlipidemia   . Hyperlipidemia   . Major depression, chronic 08/13/2012  . Melanoma ([place]) 2007   surgery at [facility], Followed by [facility]  . Myocardial infarction ([place]) 2012  . Obesity   . Tobacco abuse   . Tubular adenoma     Past Surgical History:  Procedure Laterality Date  . BIOPSY  01/07/2018   Procedure: BIOPSY;  Surgeon: Daneil Dolin, MD;  Location: [facility];  Service: Endoscopy;;  duodenal and gastric biopsy  . CARDIAC CATHETERIZATION      with stent  . COLONOSCOPY N/A 01/27/2013   JSE:GBTDVV and colonic polyps. Tubular adenomas, poor bowel prep, one-year follow-up surveillance colonoscopy recommended  . CORONARY ANGIOPLASTY WITH STENT PLACEMENT    . ESOPHAGOGASTRODUODENOSCOPY  04/24/2012   [place]-mild erosive reflux esophagitis,dilated w/80F Venia Minks, small HH, minimal chronic gastric/bulbar erosions(No H pylori)  . ESOPHAGOGASTRODUODENOSCOPY (EGD) WITH PROPOFOL N/A 01/07/2018   Dr. Gala Romney: Erythematous mucosa in the stomach, retained gastric contents, incomplete exam.  Biopsied showed mild chronic gastritis but no H. pylori.  Duodenal biopsy was negative for celiac disease.  [name] FLEXIBLE SIGMOIDOSCOPY N/A 01/07/2018   Dr. Gala Romney: Incomplete colonoscopy due to inadequate bowel prep  . LEFT HEART CATHETERIZATION WITH CORONARY ANGIOGRAM N/A 01/22/2012   Procedure: LEFT HEART CATHETERIZATION WITH CORONARY ANGIOGRAM;  Surgeon: Jolaine Artist, MD;  Location: [facility] CATH LAB;  Service: Cardiovascular;  Laterality: N/A;  . melanoma surgery  2007   [place], removed lymph nodes under arm as well,Left abd    Family History  Problem Relation Age of Onset  . Stroke Mother   . Alcohol abuse Father   . Lung cancer Unknown   . Heart disease Unknown   . Other Unknown        not real familiar with family history  . Colon cancer Neg Hx   . Colon polyps Neg Hx     Social History   Socioeconomic History  . Marital status: Divorced    Spouse name: rayann Stipp  . Number of children: 0  . Years of education: Not on file  . Highest education level: Not on file  Occupational History  . Occupation: disabled  Social Needs  . Financial resource strain: Not on file  . Food insecurity:    Worry: Not on file    Inability: Not on file  . Transportation needs:    Medical: Not on file    Non-medical: Not on  file  Tobacco Use  . Smoking status: Current Every Day Smoker    Packs/day: 1.00    Years: 30.00    [place] years: 30.00    Types: Cigarettes     Start date: 10/09/1981  . Smokeless tobacco: Never Used  . Tobacco comment: 1/2 [place] daily 11/01/17  Substance and Sexual Activity  . 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  . Drug use: No  . Sexual activity: Yes  Lifestyle  . Physical activity:    Days per week: Not on file    Minutes per session: Not on file  . Stress: Not on file  Relationships  . Social connections:    Talks on phone: Not on file    Gets together: Not on file    Attends religious service: Not on file    Active member of club or organization: Not on file    Attends meetings of clubs or organizations: Not on file    Relationship status: Not on file  . Intimate partner violence:    Fear of current or ex partner: Not on file    Emotionally abused: Not on file    Physically abused: Not on file    Forced sexual activity: Not on file  Other Topics Concern  . Not on file  Social History Narrative   Lives w/ wife     ROS:  General: Negative for anorexia, weight loss, fever, chills, fatigue, weakness. Eyes: Negative for vision changes.  ENT: Negative for hoarseness, difficulty swallowing , nasal congestion. CV: Negative for chest pain, angina, palpitations, dyspnea on exertion, peripheral edema.  Respiratory: Negative for dyspnea at rest, dyspnea on exertion, cough, sputum, wheezing.  GI: See history of present illness. GU:  Negative for dysuria, hematuria, urinary incontinence, urinary frequency, nocturnal urination.  MS: Negative for joint pain, low back pain.  Derm: Negative for rash or itching.  Neuro: Negative for weakness, abnormal sensation, seizure, frequent headaches, memory loss, confusion.  Psych: Negative for anxiety, depression, suicidal ideation, hallucinations.  Endo: Negative for unusual weight change.  Heme: Negative for bruising or bleeding. Allergy: Negative for rash or hives.        Physical Examination: Vital signs in last 24 hours: Temp:  [98.3 F (36.8 C)] 98.3 F (36.8 C) (07/17 0842) Pulse Rate:  [82] 82 (07/17 0842) Resp:  [20] 20 (07/17 0842) BP: (121)/(81) 121/81 (07/17 0842) SpO2:  [99 %] 99 % (07/17 0842) Weight:  [204 lb 8 oz (92.8 kg)] 204 lb 8 oz (92.8 kg) (07/17 0842)    General: Well-nourished, well-developed in no acute distress.  Head: Normocephalic, atraumatic.   Eyes: Conjunctiva pink, no icterus. Mouth: Oropharyngeal mucosa moist and pink , no lesions erythema or exudate. Neck: Supple without thyromegaly, masses, or lymphadenopathy.  Lungs: Clear to auscultation bilaterally.  Heart: Regular rate and rhythm, no murmurs rubs or gallops.  Abdomen: Bowel sounds are normal, nontender, nondistended, no hepatosplenomegaly or masses, no abdominal bruits or    hernia , no rebound or guarding.   Rectal: not performed Extremities: No lower extremity edema, clubbing, deformity.  Neuro: Alert and oriented x 4 , grossly normal neurologically.  Skin: Warm and dry, no rash or jaundice.   Psych: Alert and cooperative, normal mood and affect.        Intake/Output from previous day: No intake/output data recorded. Intake/Output this shift: No  intake/output data recorded.  Lab Results: CBC Recent Labs    04/24/18 1000  WBC 7.0  HGB 15.0  HCT 45.3  MCV 97.4  PLT 371   BMET Recent Labs    04/24/18 1000  NA 140  K 4.5  CL 103  CO2 32  GLUCOSE 106*  BUN 13  CREATININE 1.30*  CALCIUM 9.4   LFT Recent Labs    04/24/18 1000  BILITOT 0.5  ALKPHOS 30*  AST 18  ALT 18  PROT 6.5  ALBUMIN 3.7    Lipase No results for input(s): LIPASE in the last 72 hours.  PT/INR No results for input(s): LABPROT, INR in the last 72 hours.    Imaging Studies: No results found.Minnie.Brome week]   Impression: 52 year old gentleman with history of diabetes, gastroparesis, GERD, colon polyps who we saw back in February for abdominal pain and diarrhea.   Patient was overdue for colonoscopy due to somewhat poor prep in 2014, was advised to come back in a year but he never completed this.  He did have tubular adenomas on that 2014 colonoscopy.  He had another attempted colonoscopy earlier this year but the patient states he vomited the bowel prep several hours after he consumed it.  Colonoscopy could not be completed due to inadequate bowel prep.    He reports ongoing diarrhea, rarely passes a solid stool.  4-5 bowel movements yesterday were all liquid.  Intermittent bright red blood per rectum.  Otherwise abdominal pain significantly improved, describes as diffuse soreness, unrelated to meals.  Plan: 1. Plan for split prep with Golytely, both doses today.  Will utilize Reglan given significant gastroparesis. 2. He has been on clear liquids for 24 hours, will continue today.  3. NPO after midnight. 4. Scheduled for colonoscopy tomorrow at 11:00.  Plans for discharge afterwards. 5. Appreciate Dr. Durenda Age help with patient's hospitalization.   Laureen Ochs. Bernarda Caffey [facility] [phone] 7/17/20191:41 PM     LOS: 0 days

## 2018-04-24 NOTE — Telephone Encounter (Signed)
Called Bed placement and spoke with Juliann Pulse. They do him on the list to arrive for admission this morning. I made her aware patient will arrive around 8am.

## 2018-04-24 NOTE — Care Management Obs Status (Signed)
[facility] NOTIFICATION   Patient Details  Name: Dustin Cruz MRN: [id number] Date of Birth: October 06, 1966   Medicare Observation Status Notification Given:  Yes    Sherald Barge, RN 04/24/2018, 11:43 AM

## 2018-04-24 NOTE — Progress Notes (Signed)
Denies nausea and having frequent liquid bms which are tan

## 2018-04-25 ENCOUNTER — Observation Stay (HOSPITAL_COMMUNITY): Payer: Medicare HMO | Admitting: Anesthesiology

## 2018-04-25 ENCOUNTER — Encounter (HOSPITAL_COMMUNITY)
Admission: RE | Disposition: A | Discharge status: Home / Self Care | Payer: Self-pay | Source: Ambulatory Visit | Attending: Family Medicine

## 2018-04-25 ENCOUNTER — Encounter (HOSPITAL_COMMUNITY): Payer: Self-pay | Admitting: *Deleted

## 2018-04-25 DIAGNOSIS — Z86010 Personal history of colonic polyps: Secondary | ICD-10-CM | POA: Diagnosis not present

## 2018-04-25 DIAGNOSIS — Z794 Long term (current) use of insulin: Secondary | ICD-10-CM | POA: Diagnosis not present

## 2018-04-25 DIAGNOSIS — E1159 Type 2 diabetes mellitus with other circulatory complications: Secondary | ICD-10-CM

## 2018-04-25 DIAGNOSIS — G63 Polyneuropathy in diseases classified elsewhere: Secondary | ICD-10-CM | POA: Diagnosis not present

## 2018-04-25 DIAGNOSIS — K219 Gastro-esophageal reflux disease without esophagitis: Secondary | ICD-10-CM | POA: Diagnosis not present

## 2018-04-25 DIAGNOSIS — R197 Diarrhea, unspecified: Secondary | ICD-10-CM | POA: Diagnosis not present

## 2018-04-25 DIAGNOSIS — D123 Benign neoplasm of transverse colon: Secondary | ICD-10-CM

## 2018-04-25 DIAGNOSIS — Z720 Tobacco use: Secondary | ICD-10-CM | POA: Diagnosis not present

## 2018-04-25 DIAGNOSIS — I152 Hypertension secondary to endocrine disorders: Secondary | ICD-10-CM | POA: Diagnosis not present

## 2018-04-25 DIAGNOSIS — I10 Essential (primary) hypertension: Secondary | ICD-10-CM | POA: Diagnosis not present

## 2018-04-25 DIAGNOSIS — K529 Noninfective gastroenteritis and colitis, unspecified: Secondary | ICD-10-CM | POA: Diagnosis not present

## 2018-04-25 DIAGNOSIS — E785 Hyperlipidemia, unspecified: Secondary | ICD-10-CM | POA: Diagnosis not present

## 2018-04-25 DIAGNOSIS — E1142 Type 2 diabetes mellitus with diabetic polyneuropathy: Secondary | ICD-10-CM | POA: Diagnosis not present

## 2018-04-25 DIAGNOSIS — K210 Gastro-esophageal reflux disease with esophagitis: Secondary | ICD-10-CM | POA: Diagnosis not present

## 2018-04-25 DIAGNOSIS — K5730 Diverticulosis of large intestine without perforation or abscess without bleeding: Secondary | ICD-10-CM | POA: Diagnosis not present

## 2018-04-25 DIAGNOSIS — I2510 Atherosclerotic heart disease of native coronary artery without angina pectoris: Secondary | ICD-10-CM | POA: Diagnosis not present

## 2018-04-25 DIAGNOSIS — E781 Pure hyperglyceridemia: Secondary | ICD-10-CM | POA: Diagnosis not present

## 2018-04-25 HISTORY — PX: COLONOSCOPY WITH PROPOFOL: SHX5780

## 2018-04-25 HISTORY — PX: BIOPSY: SHX5522

## 2018-04-25 LAB — CBC
HCT: 44.6 % (ref 39.0–52.0)
Hemoglobin: 14.6 g/dL (ref 13.0–17.0)
MCH: 31.9 pg (ref 26.0–34.0)
MCHC: 32.7 g/dL (ref 30.0–36.0)
MCV: 97.4 fL (ref 78.0–100.0)
PLATELETS: 353 10*3/uL (ref 150–400)
RBC: 4.58 MIL/uL (ref 4.22–5.81)
RDW: 14 % (ref 11.5–15.5)
WBC: 7 10*3/uL (ref 4.0–10.5)

## 2018-04-25 LAB — BASIC METABOLIC PANEL
Anion gap: 6 (ref 5–15)
BUN: 9 mg/dL (ref 6–20)
CHLORIDE: 105 mmol/L (ref 98–111)
CO2: 30 mmol/L (ref 22–32)
CREATININE: 1.25 mg/dL — AB (ref 0.61–1.24)
Calcium: 9 mg/dL (ref 8.9–10.3)
GFR calc Af Amer: >60 mL/min (ref >60)
GLUCOSE: 105 mg/dL — AB (ref 70–99)
Potassium: 4.3 mmol/L (ref 3.5–5.1)
SODIUM: 141 mmol/L (ref 135–145)

## 2018-04-25 LAB — GLUCOSE, CAPILLARY
GLUCOSE-CAPILLARY: 132 mg/dL — AB (ref 70–99)
Glucose-Capillary: 132 mg/dL — ABNORMAL HIGH (ref 70–99)
Glucose-Capillary: 111 mg/dL — ABNORMAL HIGH (ref 70–99)

## 2018-04-25 LAB — HIV ANTIBODY (ROUTINE TESTING W REFLEX): HIV SCREEN 4TH GENERATION: NONREACTIVE

## 2018-04-25 SURGERY — COLONOSCOPY WITH PROPOFOL
Anesthesia: General

## 2018-04-25 MED ORDER — MIDAZOLAM HCL 2 MG/2ML IJ SOLN
INTRAMUSCULAR | Status: AC
  Filled 2018-04-25: qty 2

## 2018-04-25 MED ORDER — MIDAZOLAM HCL 5 MG/5ML IJ SOLN
INTRAMUSCULAR | Status: DC | PRN
  Administered 2018-04-25: 2 mg via INTRAVENOUS

## 2018-04-25 MED ORDER — CHLORHEXIDINE GLUCONATE CLOTH 2 % EX PADS
6.0000 | MEDICATED_PAD | Freq: Once | CUTANEOUS | Status: AC
  Administered 2018-04-25: 6 via TOPICAL

## 2018-04-25 MED ORDER — CHLORHEXIDINE GLUCONATE CLOTH 2 % EX PADS: 6.0000 | MEDICATED_PAD | Freq: Once | CUTANEOUS | Status: AC

## 2018-04-25 MED ORDER — HYDROCODONE-ACETAMINOPHEN 7.5-325 MG PO TABS: 1.0000 | ORAL_TABLET | Freq: Once | ORAL | Status: DC | PRN

## 2018-04-25 MED ORDER — FENTANYL CITRATE (PF) 100 MCG/2ML IJ SOLN: 25.0000 ug | INTRAMUSCULAR | Status: DC | PRN

## 2018-04-25 MED ORDER — LACTATED RINGERS IV SOLN
INTRAVENOUS | Status: DC
  Administered 2018-04-25: 10:00:00 via INTRAVENOUS

## 2018-04-25 MED ORDER — PROPOFOL 500 MG/50ML IV EMUL
INTRAVENOUS | Status: DC | PRN
  Administered 2018-04-25: 11:00:00 via INTRAVENOUS
  Administered 2018-04-25: 125 ug/kg/min via INTRAVENOUS

## 2018-04-25 NOTE — Op Note (Signed)
[facility] Patient Name: Dustin Cruz Procedure Date: 04/25/2018 10:59 AM MRN: [id number] Date of Birth: [birth year] Attending MD: Norvel Richards , MD CSN: [id number] Age: 52 Admit Type: Outpatient Procedure:                Colonoscopy Indications:              Chronic diarrhea Providers:                Norvel Richards, MD, Janeece Riggers, RN, Bonnetta Barry, Technician Referring MD:              Medicines:                Propofol per Anesthesia Complications:            No immediate complications. Estimated Blood Loss:     Estimated blood loss was minimal. Procedure:                Pre-Anesthesia Assessment:                           - Prior to the procedure, a History and Physical                            was performed, and patient medications and                            allergies were reviewed. The patient's tolerance of                            previous anesthesia was also reviewed. The risks                            and benefits of the procedure and the sedation                            options and risks were discussed with the patient.                            All questions were answered, and informed consent                            was obtained. Prior Anticoagulants: The patient has                            taken no previous anticoagulant or antiplatelet                            agents. ASA Grade Assessment: III - A patient with                            severe systemic disease. After reviewing the risks  and benefits, the patient was deemed in                            satisfactory condition to undergo the procedure.                           After obtaining informed consent, the colonoscope                            was passed under direct vision. Throughout the                            procedure, the patient's blood pressure, pulse, and                            oxygen saturations were  monitored continuously. The                            CF-[id number] ([id number]) scope was introduced through                            the and advanced to the 5 cm into the ileum. The                            colonoscopy was performed with ease. The patient                            tolerated the procedure well. The quality of the                            bowel preparation was adequate. Scope In: 11:16:24 AM Scope Out: 11:32:05 AM Scope Withdrawal Time: 0 hours 9 minutes 1 second  Total Procedure Duration: 0 hours 15 minutes 41 seconds  Findings:      The perianal and digital rectal examinations were normal.      A few medium-mouthed diverticula were found in the entire colon.      A 6 mm polyp was found in the hepatic flexure. The polyp was sessile.       The polyp was removed with a cold snare. Resection and retrieval were       complete. Estimated blood loss was minimal.      The exam was otherwise without abnormality on direct and retroflexion       views. Distal 5 cm of terminal ileum appeared normal. Segmental biopsies       in the right and left colon taken for histologic study. Impression:               - Diverticulosis in the entire examined colon.                           - One 6 mm polyp at the hepatic flexure, removed                            with a cold snare. Resected and retrieved.  Segmental biopsies taken                           - The examination was otherwise normal on direct                            and retroflexion views. Moderate Sedation:      Moderate (conscious) sedation was personally administered by an       anesthesia professional. The following parameters were monitored: oxygen       saturation, heart rate, blood pressure, respiratory rate, EKG, adequacy       of pulmonary ventilation, and response to care. Total physician       intraservice time was 22 minutes. Recommendation:           - Patient has a contact number  available for                            emergencies. The signs and symptoms of potential                            delayed complications were discussed with the                            patient. Return to normal activities tomorrow.                            Written discharge instructions were provided to the                            patient.                           - Diabetic (ADA) diet. Anticipate hospital                            discharge later today. Appreciate hospitalist'                            assistance with management. Follow-up pathology                            report. Procedure Code(s):        --- Professional ---                           [id number], Colonoscopy, flexible; with removal of                            tumor(s), polyp(s), or other lesion(s) by snare                            technique Diagnosis Code(s):        --- Professional ---                           D12.3, Benign neoplasm of transverse colon (hepatic  flexure or splenic flexure)                           K52.9, Noninfective gastroenteritis and colitis,                            unspecified                           K57.30, Diverticulosis of large intestine without                            perforation or abscess without bleeding CPT copyright 2017 American Medical Association. All rights reserved. The codes documented in this report are preliminary and upon coder review may  be revised to meet current compliance requirements. Cristopher Estimable. Armstrong Creasy, MD Norvel Richards, MD 04/25/2018 11:40:33 AM This report has been signed electronically. Number of Addenda: 0

## 2018-04-25 NOTE — Discharge Summary (Signed)
Physician Discharge Summary  Dustin Cruz JQB:[id number] DOB: 11/29/1965 DOA: 04/24/2018  PCP: [name], DO GI: [facility]  Admit date: 04/24/2018 Discharge date: 04/25/2018  Admitted From: Home  Disposition: Home   Recommendations for Outpatient Follow-up:  1. Follow up with PCP in 1-2 weeks 2. Please follow up with GI for biopsy results from colonoscopy.   Discharge Condition: STABLE   CODE STATUS: FULL    Brief Hospitalization Summary: Please see all hospital notes, images, labs for full details of the hospitalization.  HPI: Dustin Cruz is a 52 y.o. male smoker with type 2 DM and history of colon polyps who presented for direct admission today for planned colon survey scheduled for tomorrow.  The patient has a history of GERD, gastroparesis, hepatic steatosis and colonoscopy was attempted in 2014 but never completed secondary to poor prep.  He is presenting today with complaints of intermittent chronic diarrhea and intermittent bright red blood per rectum.  He denies melena.  He reports that he has been complaining of diarrhea for the last 9 months.  He also has symptoms of acid reflux.  He takes medications for that.  He is on insulin for his diabetes and has intermittent hyperglycemia.  The patient was seen by GI recently and had a EGD done with mild gastritis and no H. pylori was found. They had made arrangements for him to be inpatient for the bowel prep to ensure that he does a complete thorough colon prep for the procedure.    The patient was prepped for the colonoscopy and tolerated it very well.  He had the procedure on 04/25/2018.  The results are noted below.  The patient tolerated the procedure very well.  He was monitored for several hours and then after eating and drinking he felt that he wanted to discharge home.  He will follow-up with GI regarding the biopsy results from the colonoscopy.  He was given instructions and precautions on what to expect and he verbalized  understanding.  He is being discharged home in stable condition.  He should follow-up with his PCP in 1 to 2 weeks for recheck.  She has had that he could resume regular activities tomorrow.  No changes to medications.   Discharge Diagnoses:  Principal Problem:   History of colonic polyps Active Problems:   Arteriosclerotic cardiovascular disease (ASCVD)   Diabetes mellitus, type 2 (HCC)   Tobacco abuse   GERD (gastroesophageal reflux disease)   Hyperlipidemia   Hypertension associated with diabetes ([place])   Peripheral neuropathy   Hepatic steatosis   Hypertriglyceridemia   Diabetic retinopathy (HCC)   Hyperglycemia  Discharge Instructions: Discharge Instructions    Call MD for:  difficulty breathing, headache or visual disturbances   Complete by:  As directed    Call MD for:  extreme fatigue   Complete by:  As directed    Call MD for:  persistant dizziness or light-headedness   Complete by:  As directed    Call MD for:  persistant nausea and vomiting   Complete by:  As directed    Call MD for:  severe uncontrolled pain   Complete by:  As directed    Diet - low sodium heart healthy   Complete by:  As directed    Increase activity slowly   Complete by:  As directed      Allergies as of 04/25/2018   No Known Allergies     Medication List    TAKE these medications  albuterol 108 (90 Base) MCG/ACT inhaler Commonly known as:  PROVENTIL HFA;VENTOLIN HFA Inhale 2 puffs into the lungs every 6 (six) hours as needed for wheezing.   albuterol (2.5 MG/3ML) 0.083% nebulizer solution Commonly known as:  PROVENTIL Take 3 mLs (2.5 mg total) by nebulization every 6 (six) hours as needed for wheezing or shortness of breath.   aspirin EC 81 MG tablet Take 81 mg by mouth daily.   atorvastatin 80 MG tablet Commonly known as:  LIPITOR Take 1 tablet (80 mg total) by mouth daily. What changed:  when to take this   BASAGLAR KWIKPEN 100 UNIT/ML Sopn Inject 0.4 mLs (40 Units total)  into the skin daily at 10 pm.   busPIRone 7.5 MG tablet Commonly known as:  BUSPAR TAKE 1 TABLET BY MOUTH TWICE DAILY   fenofibrate 145 MG tablet Commonly known as:  TRICOR Take 1 tablet (145 mg total) by mouth daily.   furosemide 20 MG tablet Commonly known as:  LASIX Take 1 tablet (20 mg total) by mouth daily.   gabapentin 800 MG tablet Commonly known as:  NEURONTIN Take 1 tablet (800 mg total) by mouth 3 (three) times daily.   ibuprofen 200 MG tablet Commonly known as:  ADVIL,MOTRIN Take 200-400 mg by mouth daily as needed for headache or moderate pain.   insulin aspart 100 UNIT/ML FlexPen Commonly known as:  NOVOLOG FLEXPEN Inject 10 Units into the skin 3 (three) times daily with meals. Inject up to 10 units prior to meal per sliding scale.   linaGLIPtin-metFORMIN HCl 2.02-999 MG Tabs Commonly known as:  JENTADUETO Take 1 tablet by mouth 2 (two) times daily with a meal.   lisinopril 5 MG tablet Commonly known as:  PRINIVIL,ZESTRIL Take 1 tablet (5 mg total) by mouth daily. What changed:  when to take this   loratadine 10 MG tablet Commonly known as:  CLARITIN Take 10 mg by mouth daily as needed for allergies.   nitroGLYCERIN 0.4 MG SL tablet Commonly known as:  NITROSTAT Place 1 tablet (0.4 mg total) under the tongue every 5 (five) minutes as needed. For chest pain.   omeprazole 40 MG capsule Commonly known as:  PRILOSEC TAKE 1 CAPSULE BY MOUTH TWICE DAILY What changed:    how much to take  how to take this  when to take this   potassium chloride 10 MEQ tablet Commonly known as:  K-DUR Take 1 tablet (10 mEq total) by mouth daily.   sertraline 100 MG tablet Commonly known as:  ZOLOFT Take 1 tablet (100 mg total) by mouth daily.   tiZANidine 4 MG tablet Commonly known as:  ZANAFLEX Take 1 tablet (4 mg total) by mouth every 8 (eight) hours as needed for muscle spasms.   traZODone 150 MG tablet Commonly known as:  DESYREL Take 1 tablet (150 mg  total) by mouth at bedtime.      Follow-up Information    Ronnie Doss M, DO. Schedule an appointment as soon as possible for a visit in 2 week(s).   Specialty:  Family Medicine Contact information: [address] [place] [phone]          No Known Allergies Allergies as of 04/25/2018   No Known Allergies     Medication List    TAKE these medications   albuterol 108 (90 Base) MCG/ACT inhaler Commonly known as:  PROVENTIL HFA;VENTOLIN HFA Inhale 2 puffs into the lungs every 6 (six) hours as needed for wheezing.   albuterol (  2.5 MG/3ML) 0.083% nebulizer solution Commonly known as:  PROVENTIL Take 3 mLs (2.5 mg total) by nebulization every 6 (six) hours as needed for wheezing or shortness of breath.   aspirin EC 81 MG tablet Take 81 mg by mouth daily.   atorvastatin 80 MG tablet Commonly known as:  LIPITOR Take 1 tablet (80 mg total) by mouth daily. What changed:  when to take this   BASAGLAR KWIKPEN 100 UNIT/ML Sopn Inject 0.4 mLs (40 Units total) into the skin daily at 10 pm.   busPIRone 7.5 MG tablet Commonly known as:  BUSPAR TAKE 1 TABLET BY MOUTH TWICE DAILY   fenofibrate 145 MG tablet Commonly known as:  TRICOR Take 1 tablet (145 mg total) by mouth daily.   furosemide 20 MG tablet Commonly known as:  LASIX Take 1 tablet (20 mg total) by mouth daily.   gabapentin 800 MG tablet Commonly known as:  NEURONTIN Take 1 tablet (800 mg total) by mouth 3 (three) times daily.   ibuprofen 200 MG tablet Commonly known as:  ADVIL,MOTRIN Take 200-400 mg by mouth daily as needed for headache or moderate pain.   insulin aspart 100 UNIT/ML FlexPen Commonly known as:  NOVOLOG FLEXPEN Inject 10 Units into the skin 3 (three) times daily with meals. Inject up to 10 units prior to meal per sliding scale.   linaGLIPtin-metFORMIN HCl 2.02-999 MG Tabs Commonly known as:  JENTADUETO Take 1 tablet by mouth 2 (two) times daily with a meal.    lisinopril 5 MG tablet Commonly known as:  PRINIVIL,ZESTRIL Take 1 tablet (5 mg total) by mouth daily. What changed:  when to take this   loratadine 10 MG tablet Commonly known as:  CLARITIN Take 10 mg by mouth daily as needed for allergies.   nitroGLYCERIN 0.4 MG SL tablet Commonly known as:  NITROSTAT Place 1 tablet (0.4 mg total) under the tongue every 5 (five) minutes as needed. For chest pain.   omeprazole 40 MG capsule Commonly known as:  PRILOSEC TAKE 1 CAPSULE BY MOUTH TWICE DAILY What changed:    how much to take  how to take this  when to take this   potassium chloride 10 MEQ tablet Commonly known as:  K-DUR Take 1 tablet (10 mEq total) by mouth daily.   sertraline 100 MG tablet Commonly known as:  ZOLOFT Take 1 tablet (100 mg total) by mouth daily.   tiZANidine 4 MG tablet Commonly known as:  ZANAFLEX Take 1 tablet (4 mg total) by mouth every 8 (eight) hours as needed for muscle spasms.   traZODone 150 MG tablet Commonly known as:  DESYREL Take 1 tablet (150 mg total) by mouth at bedtime.       Procedures/Studies: Colonoscopy 04/25/18 Impression:       - Diverticulosis in the entire examined colon.                           - One 6 mm polyp at the hepatic flexure, removed with a cold snare. Resected and retrieved.   Segmental biopsies taken                           - The examination was otherwise normal on direct and retroflexion views. Moderate Sedation:      Moderate (conscious) sedation was personally administered by an       anesthesia professional. The following parameters were monitored: oxygen  saturation, heart rate, blood pressure, respiratory rate, EKG, adequacy       of pulmonary ventilation, and response to care. Total physician       intraservice time was 22 minutes. Recommendation:           - Patient has a contact number available for emergencies. The signs and symptoms of potential delayed complications were discussed with the  patient. Return to normal activities tomorrow.  Written discharge instructions were provided to the patient.                           - Diabetic (ADA) diet. Anticipate hospital discharge later today. Appreciate hospitalist' assistance with management. Follow-up pathology report.    Subjective: Pt says that he tolerated his colonoscopy well.  He is feeling better and wants to go home.  He has been eating and drinking.  He says he understands his discharge instructions.   Discharge Exam: Vitals:   04/25/18 1200 04/25/18 1223  BP: 129/76 (!) 156/97  Pulse: 69 79  Resp: 16 18  Temp:  97.7 F (36.5 C)  SpO2: 96% 100%   Vitals:   04/25/18 0904 04/25/18 1145 04/25/18 1200 04/25/18 1223  BP: 129/86 123/73 129/76 (!) 156/97  Pulse: 74 67 69 79  Resp:  13 16 18   Temp: 97.9 F (36.6 C) (!) 97.4 F (36.3 C)  97.7 F (36.5 C)  TempSrc: Oral   Oral  SpO2: 97% 98% 96% 100%  Weight: 92.5 kg (204 lb)     Height: 6\' 2"  (1.88 m)      General: Pt is alert, awake, not in acute distress Cardiovascular: RRR, S1/S2 +, no rubs, no gallops Respiratory: CTA bilaterally, no wheezing, no rhonchi Abdominal: Soft, NT, ND, bowel sounds + Extremities: no edema, no cyanosis   The results of significant diagnostics from this hospitalization (including imaging, microbiology, ancillary and laboratory) are listed below for reference.    Microbiology: No results found for this or any previous visit (from the past 240 hour(s)).   Labs: BNP (last 3 results) Recent Labs    10/07/17 1257  BNP 867.6*   Basic Metabolic Panel: Recent Labs  Lab 04/24/18 1000 04/25/18 0431  NA 140 141  K 4.5 4.3  CL 103 105  CO2 32 30  GLUCOSE 106* 105*  BUN 13 9  CREATININE 1.30* 1.25*  CALCIUM 9.4 9.0   Liver Function Tests: Recent Labs  Lab 04/24/18 1000  AST 18  ALT 18  ALKPHOS 30*  BILITOT 0.5  PROT 6.5  ALBUMIN 3.7   No results for input(s): LIPASE, AMYLASE in the last 168 hours. No results for  input(s): AMMONIA in the last 168 hours. CBC: Recent Labs  Lab 04/24/18 1000 04/25/18 0431  WBC 7.0 7.0  HGB 15.0 14.6  HCT 45.3 44.6  MCV 97.4 97.4  PLT 371 353   Cardiac Enzymes: No results for input(s): CKTOTAL, CKMB, CKMBINDEX, TROPONINI in the last 168 hours. BNP: Invalid input(s): POCBNP CBG: Recent Labs  Lab 04/24/18 1653 04/24/18 2159 04/25/18 0748 04/25/18 0908 04/25/18 1144  GLUCAP 77 139* 132* 132* 111*   D-Dimer No results for input(s): DDIMER in the last 72 hours. Hgb A1c No results for input(s): HGBA1C in the last 72 hours. Lipid Profile No results for input(s): CHOL, HDL, LDLCALC, TRIG, CHOLHDL, LDLDIRECT in the last 72 hours. Thyroid function studies No results for input(s): TSH, T4TOTAL, T3FREE, THYROIDAB in the last 72 hours.  Invalid  input(s): FREET3 Anemia work up No results for input(s): VITAMINB12, FOLATE, FERRITIN, TIBC, IRON, RETICCTPCT in the last 72 hours. Urinalysis    Component Value Date/Time   COLORURINE YELLOW 10/28/2017 1625   APPEARANCEUR CLEAR 10/28/2017 1625   APPEARANCEUR Clear 10/15/2017 1159   LABSPEC 1.023 10/28/2017 1625   PHURINE 6.0 10/28/2017 1625   GLUCOSEU >=500 (A) 10/28/2017 1625   HGBUR SMALL (A) 10/28/2017 1625   BILIRUBINUR NEGATIVE 10/28/2017 1625   BILIRUBINUR Negative 10/15/2017 1159   KETONESUR NEGATIVE 10/28/2017 1625   PROTEINUR 100 (A) 10/28/2017 1625   UROBILINOGEN 0.2 01/26/2014 1618   NITRITE NEGATIVE 10/28/2017 1625   LEUKOCYTESUR NEGATIVE 10/28/2017 1625   LEUKOCYTESUR Negative 10/15/2017 1159   Sepsis Labs Invalid input(s): PROCALCITONIN,  WBC,  LACTICIDVEN Microbiology No results found for this or any previous visit (from the past 240 hour(s)).  Time coordinating discharge:   SIGNED:  Irwin Brakeman, MD  [facility] 04/25/2018, 1:36 PM Pager [phone]  If 7PM-7AM, please contact night-coverage [email or web address] Password [email or web address]

## 2018-04-25 NOTE — Transfer of Care (Signed)
Immediate Anesthesia Transfer of Care Note  Patient: Dustin Cruz  Procedure(s) Performed: COLONOSCOPY WITH PROPOFOL (N/A ) BIOPSY  Patient Location: PACU  Anesthesia Type:MAC  Level of Consciousness: awake and patient cooperative  Airway & Oxygen Therapy: Patient Spontanous Breathing and Patient connected to nasal cannula oxygen  Post-op Assessment: Report given to RN, Post -op Vital signs reviewed and stable and Patient moving all extremities  Post vital signs: Reviewed and stable  Last Vitals:  Vitals Value Taken Time  BP    Temp    Pulse 96 04/25/2018 11:42 AM  Resp    SpO2 77 % 04/25/2018 11:42 AM  Vitals shown include unvalidated device data.  Last Pain:  Vitals:   04/25/18 0904  TempSrc: Oral  PainSc: 0-No pain      Patients Stated Pain Goal: 6 (96/29/52 8413)  Complications: No apparent anesthesia complications

## 2018-04-25 NOTE — Anesthesia Preprocedure Evaluation (Addendum)
Anesthesia Evaluation  Patient identified by MRN, date of birth, ID band Patient awake    Reviewed: Allergy & Precautions, NPO status , Patient's Chart, lab work & pertinent test results  Airway Mallampati: II  TM Distance: >3 FB Neck ROM: Full    Dental no notable dental hx. (+) Poor Dentition   Pulmonary neg pulmonary ROS, asthma , COPD,  COPD inhaler, Current Smoker,    Pulmonary exam normal breath sounds clear to auscultation       Cardiovascular Exercise Tolerance: Good hypertension, Pt. on medications + CAD and + Past MI  negative cardio ROS Normal cardiovascular examI Rhythm:Regular Rate:Normal  States good ET -brought in for prep -yesterday  MI ~ 2012   Neuro/Psych Anxiety Depression  Neuromuscular disease negative neurological ROS  negative psych ROS   GI/Hepatic negative GI ROS, Neg liver ROS, GERD  Medicated and Controlled,  Endo/Other  negative endocrine ROSdiabetes, Well Controlled, Type 1  Renal/GU negative Renal ROS  negative genitourinary   Musculoskeletal negative musculoskeletal ROS (+)   Abdominal   Peds negative pediatric ROS (+)  Hematology negative hematology ROS (+)   Anesthesia Other Findings   Reproductive/Obstetrics negative OB ROS                            Anesthesia Physical Anesthesia Plan  ASA: III  Anesthesia Plan: General   Post-op Pain Management:    Induction: Intravenous  PONV Risk Score and Plan:   Airway Management Planned: Nasal Cannula and Simple Face Mask  Additional Equipment:   Intra-op Plan:   Post-operative Plan:   Informed Consent: I have reviewed the patients History and Physical, chart, labs and discussed the procedure including the risks, benefits and alternatives for the proposed anesthesia with the patient or authorized representative who has indicated his/her understanding and acceptance.   Dental advisory given  Plan  Discussed with: CRNA  Anesthesia Plan Comments:         Anesthesia Quick Evaluation

## 2018-04-25 NOTE — Anesthesia Postprocedure Evaluation (Signed)
Anesthesia Post Note  Patient: Dustin Cruz  Procedure(s) Performed: COLONOSCOPY WITH PROPOFOL (N/A ) BIOPSY  Patient location during evaluation: PACU Anesthesia Type: MAC Level of consciousness: awake and patient cooperative Pain management: pain level controlled Vital Signs Assessment: post-procedure vital signs reviewed and stable Respiratory status: spontaneous breathing, nonlabored ventilation and respiratory function stable Cardiovascular status: blood pressure returned to baseline Postop Assessment: no apparent nausea or vomiting Anesthetic complications: no     Last Vitals:  Vitals:   04/25/18 0600 04/25/18 0904  BP: (!) 143/93 129/86  Pulse: 84 74  Resp: 16   Temp: 36.7 C 36.6 C  SpO2: 97% 97%    Last Pain:  Vitals:   04/25/18 0904  TempSrc: Oral  PainSc: 0-No pain                 Previn Jian J

## 2018-04-25 NOTE — Discharge Instructions (Signed)
Diverticulosis Diverticulosis is a condition that develops when small pouches (diverticula) form in the wall of the large intestine (colon). The colon is where water is absorbed and stool is formed. The pouches form when the inside layer of the colon pushes through weak spots in the outer layers of the colon. You may have a few pouches or many of them. What are the causes? The cause of this condition is not known. What increases the risk? The following factors may make you more likely to develop this condition:  Being older than age 27. Your risk for this condition increases with age. Diverticulosis is rare among people younger than age 36. By age 28, many people have it.  Eating a low-fiber diet.  Having frequent constipation.  Being overweight.  Not getting enough exercise.  Smoking.  Taking over-the-counter pain medicines, like aspirin and ibuprofen.  Having a family history of diverticulosis.  What are the signs or symptoms? In most people, there are no symptoms of this condition. If you do have symptoms, they may include:  Bloating.  Cramps in the abdomen.  Constipation or diarrhea.  Pain in the lower left side of the abdomen.  How is this diagnosed? This condition is most often diagnosed during an exam for other colon problems. Because diverticulosis usually has no symptoms, it often cannot be diagnosed independently. This condition may be diagnosed by:  Using a flexible scope to examine the colon (colonoscopy).  Taking an X-ray of the colon after dye has been put into the colon (barium enema).  Doing a CT scan.  How is this treated? You may not need treatment for this condition if you have never developed an infection related to diverticulosis. If you have had an infection before, treatment may include:  Eating a high-fiber diet. This may include eating more fruits, vegetables, and grains.  Taking a fiber supplement.  Taking a live bacteria supplement  (probiotic).  Taking medicine to relax your colon.  Taking antibiotic medicines.  Follow these instructions at home:  Drink 6-8 glasses of water or more each day to prevent constipation.  Try not to strain when you have a bowel movement.  If you have had an infection before: ? Eat more fiber as directed by your health care provider or your diet and nutrition specialist (dietitian). ? Take a fiber supplement or probiotic, if your health care provider approves.  Take over-the-counter and prescription medicines only as told by your health care provider.  If you were prescribed an antibiotic, take it as told by your health care provider. Do not stop taking the antibiotic even if you start to feel better.  Keep all follow-up visits as told by your health care provider. This is important. Contact a health care provider if:  You have pain in your abdomen.  You have bloating.  You have cramps.  You have not had a bowel movement in 3 days. Get help right away if:  Your pain gets worse.  Your bloating becomes very bad.  You have a fever or chills, and your symptoms suddenly get worse.  You vomit.  You have bowel movements that are bloody or black.  You have bleeding from your rectum. Summary  Diverticulosis is a condition that develops when small pouches (diverticula) form in the wall of the large intestine (colon).  You may have a few pouches or many of them.  This condition is most often diagnosed during an exam for other colon problems.  If you have had an  infection related to diverticulosis, treatment may include increasing the fiber in your diet, taking supplements, or taking medicines. This information is not intended to replace advice given to you by your health care provider. Make sure you discuss any questions you have with your health care provider. Document Released: 06/22/2004 Document Revised: 08/14/2016 Document Reviewed: 08/14/2016 Elsevier Interactive  Patient Education  2017 [place] Screening Colorectal cancer screening is a group of tests used to check for colorectal cancer. Colorectal refers to your colon and rectum. Your colon and rectum are located at the end of your large intestine and carry your bowel movements out of your body. Why is colorectal cancer screening done? It is common for abnormal growths (polyps) to form in the lining of your colon, especially as you get older. These polyps can be cancerous or become cancerous. If colorectal cancer is found at an early stage, it is treatable. Who should be screened for colorectal cancer? Screening is recommended for all adults at average risk starting at age 60. Tests may be recommended every 1 to 10 years. Your health care provider may recommend earlier or more frequent screening if you have:  A history of colorectal cancer or polyps.  A family member with a history of colorectal cancer or polyps.  Inflammatory bowel disease, such as ulcerative colitis or Crohn disease.  A type of hereditary colon cancer syndrome.  Colorectal cancer symptoms.  Types of screening tests There are several types of colorectal screening tests. They include:  Guaiac-based fecal occult blood testing.  Fecal immunochemical test (FIT).  Stool DNA test.  Barium enema.  Virtual colonoscopy.  Sigmoidoscopy. During this test, a sigmoidoscope is used to examine your rectum and lower colon. A sigmoidoscope is a flexible tube with a camera that is inserted through your anus into your rectum and lower colon.  Colonoscopy. During this test, a colonoscope is used to examine your entire colon. A colonoscope is a long, thin, flexible tube with a camera. This test examines your entire colon and rectum.  This information is not intended to replace advice given to you by your health care provider. Make sure you discuss any questions you have with your health care provider. Document  Released: 03/15/2010 Document Revised: 05/04/2016 Document Reviewed: 01/01/2014 Elsevier Interactive Patient Education  2018 [employer].   Colon Polyps Polyps are tissue growths inside the body. Polyps can grow in many places, including the large intestine (colon). A polyp may be a round bump or a mushroom-shaped growth. You could have one polyp or several. Most colon polyps are noncancerous (benign). However, some colon polyps can become cancerous over time. What are the causes? The exact cause of colon polyps is not known. What increases the risk? This condition is more likely to develop in people who:  Have a family history of colon cancer or colon polyps.  Are older than 80 or older than 45 if they are African American.  Have inflammatory bowel disease, such as ulcerative colitis or Crohn disease.  Are overweight.  Smoke cigarettes.  Do not get enough exercise.  Drink too much alcohol.  Eat a diet that is: ? High in fat and red meat. ? Low in fiber.  Had childhood cancer that was treated with abdominal radiation.  What are the signs or symptoms? Most polyps do not cause symptoms. If you have symptoms, they may include:  Blood coming from your rectum when having a bowel movement.  Blood in your stool.The stool  may look dark red or black.  A change in bowel habits, such as constipation or diarrhea.  How is this diagnosed? This condition is diagnosed with a colonoscopy. This is a procedure that uses a lighted, flexible scope to look at the inside of your colon. How is this treated? Treatment for this condition involves removing any polyps that are found. Those polyps will then be tested for cancer. If cancer is found, your health care provider will talk to you about options for colon cancer treatment. Follow these instructions at home: Diet  Eat plenty of fiber, such as fruits, vegetables, and whole grains.  Eat foods that are high in calcium and vitamin D, such  as milk, cheese, yogurt, eggs, liver, fish, and broccoli.  Limit foods high in fat, red meats, and processed meats, such as hot dogs, sausage, bacon, and lunch meats.  Maintain a healthy weight, or lose weight if recommended by your health care provider. General instructions  Do not smoke cigarettes.  Do not drink alcohol excessively.  Keep all follow-up visits as told by your health care provider. This is important. This includes keeping regularly scheduled colonoscopies. Talk to your health care provider about when you need a colonoscopy.  Exercise every day or as told by your health care provider. Contact a health care provider if:  You have new or worsening bleeding during a bowel movement.  You have new or increased blood in your stool.  You have a change in bowel habits.  You unexpectedly lose weight. This information is not intended to replace advice given to you by your health care provider. Make sure you discuss any questions you have with your health care provider. Document Released: 06/21/2004 Document Revised: 03/02/2016 Document Reviewed: 08/16/2015 Elsevier Interactive Patient Education  2018 [employer].  Colonoscopy, Adult, Care After This sheet gives you information about how to care for yourself after your procedure. Your doctor may also give you more specific instructions. If you have problems or questions, call your doctor. Follow these instructions at home: General instructions   For the first 24 hours after the procedure: ? Do not drive or use machinery. ? Do not sign important documents. ? Do not drink alcohol. ? Do your daily activities more slowly than normal. ? Eat foods that are soft and easy to digest. ? Rest often.  Take over-the-counter or prescription medicines only as told by your doctor.  It is up to you to get the results of your procedure. Ask your doctor, or the department performing the procedure, when your results will be ready. To  help cramping and bloating:  Try walking around.  Put heat on your belly (abdomen) as told by your doctor. Use a heat source that your doctor recommends, such as a moist heat [place] or a heating pad. ? Put a towel between your skin and the heat source. ? Leave the heat on for 20-30 minutes. ? Remove the heat if your skin turns bright red. This is especially important if you cannot feel pain, heat, or cold. You can get burned. Eating and drinking  Drink enough fluid to keep your pee (urine) clear or pale yellow.  Return to your normal diet as told by your doctor. Avoid heavy or fried foods that are hard to digest.  Avoid drinking alcohol for as long as told by your doctor. Contact a doctor if:  You have blood in your poop (stool) 2-3 days after the procedure. Get help right away if:  You have  more than a small amount of blood in your poop.  You see large clumps of tissue (blood clots) in your poop.  Your belly is swollen.  You feel sick to your stomach (nauseous).  You throw up (vomit).  You have a fever.  You have belly pain that gets worse, and medicine does not help your pain. This information is not intended to replace advice given to you by your health care provider. Make sure you discuss any questions you have with your health care provider. Document Released: 10/28/2010 Document Revised: 06/19/2016 Document Reviewed: 06/19/2016 Elsevier Interactive Patient Education  2017 [place] Need to Know About Cancer Prevention Although there is no guaranteed method for preventing all cancers, there are many steps that you can take to lower your risk of developing the disease. Making healthy food choices, maintaining a healthy lifestyle, getting regular screenings, and knowing your familys cancer history are all ways that can help to reduce your risk. What nutrition changes can be made? Maintaining a healthy, plant-based diet is one of the easiest ways to lower  your cancer risk.  Try to eat more than 2 cups of fruits and vegetables every day.  Eat whole-grain foods instead of refined or processed grains.  Cut down on the amount of red meat and processed meat that you eat. Also limit your intake of charred and smoked meat.  Eat portions that help you stay at a healthy weight.  Limit alcohol intake to no more than 1 drink a day for nonpregnant women and 2 drinks a day for men. One drink equals 12 oz of beer, 5 oz of wine, or 1 oz of hard liquor.  What lifestyle changes can be made?  Do not use any products that contain nicotine or tobacco, such as cigarettes and e-cigarettes. If you need help quitting, ask your health care provider.  Get regular exercise. Adults should aim for 150 minutes of moderate-intensity exercise (walking, biking, yoga) or 75 minutes of vigorous exercise (running, circuit training, swimming) every week. Exercise should be spread throughout the week, if possible.  Stay at a healthy weight. Talk with your health care provider about what your weight should be.  Reduce activities that involve a lack of physical activity (are sedentary) such as watching TV.  Stay safe in the sun by applying sunscreen and covering up with hats, clothing, and sunglasses.  Do not use tanning beds or sunlamps.  Avoid exposure to harmful substances such as asbestos, silica, solvents, or radon. Wear a protective mask if you must work near harmful substances. Have your home checked for radon, and hire a professional to lower the radon level if needed.  Get vaccines to help prevent conditions that can eventually lead to cancer, such as hepatitis and HPV (human papillomavirus). Ask your health care provider about which vaccines you should get. Why are these changes important?  Tobacco use is the leading cause of cancer and death from cancer.  Cancer cases in the [place] are linked to higher body weight and obesity, lack of physical activity,  and an unhealthy diet.  Cutting your exposure to ultraviolet (UV) radiation and avoiding sunburns lowers your chance of developing skin cancer or melanoma.  HPV is associated with several types of cancer, such as penile, anal, cervical, vulvar, and throat cancer. The HPV vaccine can help to reduce the spread of this virus and help to lower the risk of developing cancer. What can happen if changes are not made?  If you do not maintain a healthy diet and lifestyle, reduce sun exposure, or quit tobacco use, you may raise your risk of developing certain types of cancer.  Tobacco use has been linked to cancers of the lung, mouth, esophagus, throat, bladder, kidney, liver, stomach, colon and rectum, and cervix.  Obesity has been linked to an increased risk of developing cancers of the breast, colon and rectum, esophagus, kidney, pancreas, and gallbladder.  Exposure to UV radiation can cause skin damage that can lead to skin cancer and melanoma.  What can I do to lower my risk? Along with having a healthy diet and lifestyle, you should talk with your health care provider about recommendations for cancer screening.  Pap and HPV testing help to reduce a womans risk for developing cervical cancer.  Mammograms help to detect signs of breast cancer and have been shown to reduce death from breast cancer in women over age 19.  Colorectal cancer screenings--including colonoscopy, sigmoidoscopy, and stool tests--can help to detect early signs of cancer.  Lung cancer screening with CT scanning has been shown to reduce cancer deaths in heavy smokers over age 29.  Skin exams are sometimes recommended for people who are at a high risk for developing skin cancer. Talk with your health care provider or dermatologist if you notice any new skin changes, moles, or changes to existing moles.  Also talk with your health care provider about any history of cancer in your family. Depending on your family history of  cancer, your health care provider may recommend genetic testing to determine whether you may be at higher risk for developing certain types of cancer. Results from these tests can help in making decisions about future medical care and steps for prevention. Where to find more information:  [place]: www.cancer.gov  Cancer Trends Progress Report: www.progressreport.cancer.gov  American Cancer Society: www.cancer.org Contact a health care provider if:  You would like to discuss healthy ways to improve your diet and lifestyle.  You would like to learn more about quitting smoking or tobacco use.  You would like to discuss your family history of cancer and recommendations for cancer screening. Summary  You can take steps to reduce your risk of developing cancer.  Maintaining a healthy, plant-based diet is one of the easiest ways to lower your cancer risk.  Lifestyle changes can help to reduce your cancer risk. These include staying away from tobacco, reducing sun exposure, and getting regular exercise.  Follow recommendations from your health care provider about screening tests for cancer of the cervix, breast, colon and rectum, lung, and skin.  Talk with your health care provider about any history of cancer in your family. This information is not intended to replace advice given to you by your health care provider. Make sure you discuss any questions you have with your health care provider. Document Released: 07/07/2016 Document Revised: 07/07/2016 Document Reviewed: 07/07/2016 Elsevier Interactive Patient Education  [employer].

## 2018-04-25 NOTE — Progress Notes (Signed)
Pt's IV catheter removed and intact. Pt's IV site clean dry and intact. Discharge instructions including medications and follow up appointments were reviewed and discussed with patient. All questions were answered and no further questions at this time. Pt in stable condition and in no acute distress at time of discharge. Pt escorted by nurse tech 

## 2018-04-25 NOTE — Progress Notes (Signed)
Patient seen in short stay. CBC normal. Ready for colonoscopy.  CBC normal.  The risks, benefits, limitations, alternatives and imponderables have been reviewed with the patient. Questions have been answered. All parties are agreeable.   Anticipate discharge following the procedure.  Further recommendations to follow.

## 2018-04-25 NOTE — Progress Notes (Signed)
Patient completed colonoscopy and is currently in room Patient alert and oriented and denies any pain or discomfort. Vital signs are as follows    04/25/18 1223  Vitals  Temp 97.7 F (36.5 C)  Temp Source Oral  BP (!) 156/97  BP Location Right Arm  BP Method Automatic  Patient Position (if appropriate) Sitting  Pulse Rate 79  Pulse Rate Source Dinamap  Resp 18  Oxygen Therapy  SpO2 100 %  Pain Assessment  Pain Scale 0-10  Pain Score 0   Will continue to monitor patient.

## 2018-04-30 ENCOUNTER — Encounter (HOSPITAL_COMMUNITY): Payer: Self-pay | Admitting: Internal Medicine

## 2018-05-08 ENCOUNTER — Other Ambulatory Visit: Payer: Self-pay | Admitting: Family Medicine

## 2018-05-09 ENCOUNTER — Encounter: Payer: Self-pay | Admitting: Internal Medicine

## 2018-05-09 ENCOUNTER — Telehealth: Payer: Self-pay

## 2018-05-09 NOTE — Telephone Encounter (Signed)
Basaglar Kwikpen non formulary  Formulary are Lantus Solostar  [place] and [place] Flex touch   Can you change?

## 2018-05-10 ENCOUNTER — Other Ambulatory Visit: Payer: Self-pay | Admitting: Family Medicine

## 2018-05-10 MED ORDER — INSULIN GLARGINE 100 UNIT/ML SOLOSTAR PEN: 40.0000 [IU] | PEN_INJECTOR | Freq: Every day | SUBCUTANEOUS | 99 refills | Status: DC

## 2018-05-10 NOTE — Telephone Encounter (Signed)
I think he was on lantus before.  I have reordered Lantus solostar 40 units QHS.

## 2018-05-13 ENCOUNTER — Telehealth: Payer: Self-pay

## 2018-05-13 NOTE — Telephone Encounter (Signed)
Per RMR-  Rourk, Cristopher Estimable, MD          Send letter to patient.  Send copy of letter with path to referring provider and PCP.   Offer ov w extender [id number] diarrhea

## 2018-05-20 ENCOUNTER — Encounter: Payer: Self-pay | Admitting: Gastroenterology

## 2018-05-20 NOTE — Telephone Encounter (Signed)
OV made and letter mailed °

## 2018-05-22 ENCOUNTER — Encounter: Payer: Self-pay | Admitting: Family Medicine

## 2018-05-22 ENCOUNTER — Ambulatory Visit (INDEPENDENT_AMBULATORY_CARE_PROVIDER_SITE_OTHER): Payer: Medicare HMO | Admitting: Family Medicine

## 2018-05-22 VITALS — BP 119/75 | HR 87 | Temp 98.6°F | Ht 74.0 in | Wt 202.2 lb

## 2018-05-22 DIAGNOSIS — K5901 Slow transit constipation: Secondary | ICD-10-CM

## 2018-05-22 DIAGNOSIS — R809 Proteinuria, unspecified: Secondary | ICD-10-CM | POA: Diagnosis not present

## 2018-05-22 DIAGNOSIS — Z794 Long term (current) use of insulin: Secondary | ICD-10-CM | POA: Diagnosis not present

## 2018-05-22 DIAGNOSIS — E1129 Type 2 diabetes mellitus with other diabetic kidney complication: Secondary | ICD-10-CM

## 2018-05-22 DIAGNOSIS — E1142 Type 2 diabetes mellitus with diabetic polyneuropathy: Secondary | ICD-10-CM

## 2018-05-22 LAB — BAYER DCA HB A1C WAIVED: HB A1C: 7.3 % — AB (ref <7.0)

## 2018-05-22 MED ORDER — POLYETHYLENE GLYCOL 3350 17 GM/SCOOP PO POWD: ORAL | 1 refills | Status: DC

## 2018-05-22 MED ORDER — LISINOPRIL 5 MG PO TABS: 5.0000 mg | ORAL_TABLET | Freq: Every day | ORAL | 1 refills | Status: DC

## 2018-05-22 NOTE — Patient Instructions (Addendum)
Increase your Lantus by 1 unit every 3 days for fasting morning blood sugars of more than 120.  Hold off on changing your NovoLog.  Go see Dr. Marin Comment at [facility] for your eye exam.  Have her send me a copy of this when you are done.  I have sent in MiraLAX to help with your constipation.  See me back in 3 months.   Constipation, Adult Constipation is when a person:  Poops (has a bowel movement) fewer times in a week than normal.  Has a hard time pooping.  Has poop that is dry, hard, or bigger than normal.  Follow these instructions at home: Eating and drinking   Eat foods that have a lot of fiber, such as: ? Fresh fruits and vegetables. ? Whole grains. ? Beans.  Eat less of foods that are high in fat, low in fiber, or overly processed, such as: ? [place] fries. ? Hamburgers. ? Cookies. ? Candy. ? Soda.  Drink enough fluid to keep your pee (urine) clear or pale yellow. General instructions  Exercise regularly or as told by your doctor.  Go to the restroom when you feel like you need to poop. Do not hold it in.  Take over-the-counter and prescription medicines only as told by your doctor. These include any fiber supplements.  Do pelvic floor retraining exercises, such as: ? Doing deep breathing while relaxing your lower belly (abdomen). ? Relaxing your pelvic floor while pooping.  Watch your condition for any changes.  Keep all follow-up visits as told by your doctor. This is important. Contact a doctor if:  You have pain that gets worse.  You have a fever.  You have not pooped for 4 days.  You throw up (vomit).  You are not hungry.  You lose weight.  You are bleeding from the anus.  You have thin, pencil-like poop (stool). Get help right away if:  You have a fever, and your symptoms suddenly get worse.  You leak poop or have blood in your poop.  Your belly feels hard or bigger than normal (is bloated).  You have very bad belly pain.  You feel  dizzy or you faint. This information is not intended to replace advice given to you by your health care provider. Make sure you discuss any questions you have with your health care provider. Document Released: 03/13/2008 Document Revised: 04/14/2016 Document Reviewed: 03/15/2016 Elsevier Interactive Patient Education  2018 [employer].

## 2018-05-22 NOTE — Progress Notes (Addendum)
Subjective: CC:DM2 HPI: Dustin Cruz is a 52 y.o. male presenting to clinic today for:  1. Type 2 Diabetes w/ microalbuminuria:  Patient here for follow-up on diabetes. High at home: 200s ; Low at home: 200s.  He notes blood sugars have been uncontrolled since his friend moved out, who was preparing most of his meals.  He has since started eating quite a bit more carbs.  He has increased his Lantus to 48 units units (was on 40 units of Basaglar previously) since yesterday, Novolog (10 units with meals), he was started on ACE inhibitor at last visit, lisinopril 5 mg, after being found to have albuminuria.  Side effects: none  Last eye exam: he thinks <1 year; he has not yet scheduled an appointment for an eye exam.  He will go to [facility] today and see if he can have this done. Last foot exam: 04/2018 Last A1c: 02/2018 was >10 Nephropathy screen indicated?: on ACE-I now. Last flu, zoster and/or pneumovax: UTD  No dizziness, LOC, polyuria, polydipsia, unintended weight loss/gain, foot ulcerations, shortness of breath or chest pain.  He reports LE numbness/tingling in extremities that is chronic.  2.  Constipation Patient reports he has had constipation issues for some time now but they seem to be exacerbated since having his colonoscopy.  He reports feeling full easily.  Bowel movements have been nonbloody.  No nausea, vomiting.  No significant abdominal pain but he does note pressure in the upper abdomen.  No therapies tried to relieve symptoms.  Last bowel movement was this morning and was seemingly normal.  Past Medical History:  Diagnosis Date  . Anxiety   . Asthma   . COPD (chronic obstructive pulmonary disease) ([place])   . Coronary artery disease    s/p BMS to [facility] 10/2010;  Cath 01/22/12 prox 30-40% LAD, LCx ramus w/ hazy 70-80% in-stent restenosis, EF 60% treated medically  . Coronary artery disease    s/p BMS to [facility] 10/2010;  Cath 01/22/12 prox 30-40% LAD, LCx ramus w/ hazy 70-80%  in-stent restenosis, EF 60% treated medically   . Depression   . Diabetes mellitus    Type 2  . Gastroparesis 05/2013  . GERD (gastroesophageal reflux disease)   . HTN (hypertension)   . Hyperlipidemia   . Hyperlipidemia   . Major depression, chronic 08/13/2012  . Melanoma ([place]) 2007   surgery at [facility], Followed by [facility]  . Myocardial infarction ([place]) 2012  . Obesity   . Tobacco abuse   . Tubular adenoma    No Known Allergies  Current Outpatient Medications:  .  albuterol (PROVENTIL HFA;VENTOLIN HFA) 108 (90 Base) MCG/ACT inhaler, Inhale 2 puffs into the lungs every 6 (six) hours as needed for wheezing., Disp: 1 Inhaler, Rfl: 2 .  albuterol (PROVENTIL) (2.5 MG/3ML) 0.083% nebulizer solution, Take 3 mLs (2.5 mg total) by nebulization every 6 (six) hours as needed for wheezing or shortness of breath., Disp: 75 mL, Rfl: 3 .  aspirin EC 81 MG tablet, Take 81 mg by mouth daily., Disp: , Rfl:  .  atorvastatin (LIPITOR) 80 MG tablet, Take 1 tablet (80 mg total) by mouth daily. (Patient taking differently: Take 80 mg by mouth every evening. ), Disp: 90 tablet, Rfl: 1 .  busPIRone (BUSPAR) 7.5 MG tablet, TAKE 1 TABLET BY MOUTH TWICE DAILY, Disp: 60 tablet, Rfl: 1 .  fenofibrate (TRICOR) 145 MG tablet, Take 1 tablet (145 mg total) by mouth daily., Disp: 90 tablet, Rfl: 3 .  furosemide (  LASIX) 20 MG tablet, Take 1 tablet (20 mg total) by mouth daily., Disp: 10 tablet, Rfl: 0 .  gabapentin (NEURONTIN) 800 MG tablet, Take 1 tablet (800 mg total) by mouth 3 (three) times daily., Disp: 90 tablet, Rfl: 11 .  ibuprofen (ADVIL,MOTRIN) 200 MG tablet, Take 200-400 mg by mouth daily as needed for headache or moderate pain., Disp: , Rfl:  .  insulin aspart (NOVOLOG FLEXPEN) 100 UNIT/ML FlexPen, Inject 10 Units into the skin 3 (three) times daily with meals. Inject up to 10 units prior to meal per sliding scale., Disp: 15 mL, Rfl: 2 .  Insulin Glargine (LANTUS SOLOSTAR) 100 UNIT/ML Solostar Pen, Inject 40  Units into the skin daily. (Patient taking differently: Inject 48 Units into the skin daily. ), Disp: 5 pen, Rfl: PRN .  Linagliptin-Metformin HCl (JENTADUETO) 2.02-999 MG TABS, Take 1 tablet by mouth 2 (two) times daily with a meal., Disp: 180 tablet, Rfl: 3 .  lisinopril (PRINIVIL,ZESTRIL) 5 MG tablet, Take 1 tablet (5 mg total) by mouth daily. (Patient taking differently: Take 5 mg by mouth at bedtime. ), Disp: 30 tablet, Rfl: 1 .  loratadine (CLARITIN) 10 MG tablet, Take 10 mg by mouth daily as needed for allergies., Disp: , Rfl:  .  nitroGLYCERIN (NITROSTAT) 0.4 MG SL tablet, Place 1 tablet (0.4 mg total) under the tongue every 5 (five) minutes as needed. For chest pain., Disp: 25 tablet, Rfl: 3 .  omeprazole (PRILOSEC) 40 MG capsule, TAKE 1 CAPSULE BY MOUTH TWICE DAILY (Patient taking differently: TAKE 1 CAPSULE BY MOUTH at bedtime), Disp: 180 capsule, Rfl: 1 .  potassium chloride (K-DUR) 10 MEQ tablet, Take 1 tablet (10 mEq total) by mouth daily., Disp: 10 tablet, Rfl: 0 .  sertraline (ZOLOFT) 100 MG tablet, Take 1 tablet (100 mg total) by mouth daily., Disp: 90 tablet, Rfl: 3 .  tiZANidine (ZANAFLEX) 4 MG tablet, Take 1 tablet (4 mg total) by mouth every 8 (eight) hours as needed for muscle spasms., Disp: 21 tablet, Rfl: 0 .  traZODone (DESYREL) 150 MG tablet, Take 1 tablet (150 mg total) by mouth at bedtime., Disp: 90 tablet, Rfl: 1 Social History   Socioeconomic History  . Marital status: Divorced    Spouse name: rayann Eichinger  . Number of children: 0  . Years of education: Not on file  . Highest education level: Not on file  Occupational History  . Occupation: disabled  Social Needs  . Financial resource strain: Not on file  . Food insecurity:    Worry: Not on file    Inability: Not on file  . Transportation needs:    Medical: Not on file    Non-medical: Not on file  Tobacco Use  . Smoking status: Current Every Day Smoker    Packs/day: 1.00    Years: 30.00    [place] years:  30.00    Types: Cigarettes    Start date: 10/09/1981  . Smokeless tobacco: Never Used  . Tobacco comment: 1/2 Husain daily 11/01/17  Substance and Sexual Activity  . 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  . Drug use: No  . Sexual activity: Yes  Lifestyle  . Physical activity:    Days per week: Not on file    Minutes per session: Not on file  . Stress: Not on file  Relationships  . Social connections:  Talks on phone: Not on file    Gets together: Not on file    Attends religious service: Not on file    Active member of club or organization: Not on file    Attends meetings of clubs or organizations: Not on file    Relationship status: Not on file  . Intimate partner violence:    Fear of current or ex partner: Not on file    Emotionally abused: Not on file    Physically abused: Not on file    Forced sexual activity: Not on file  Other Topics Concern  . Not on file  Social History Narrative   Lives w/ wife   Family History  Problem Relation Age of Onset  . Stroke Mother   . Alcohol abuse Father   . Lung cancer Unknown   . Heart disease Unknown   . Other Unknown        not real familiar with family history  . Colon cancer Neg Hx   . Colon polyps Neg Hx     Health Maintenance: DM eye, Urine micro  Objective: Office vital signs reviewed. BP 119/75   Pulse 87   Temp 98.6 F (37 C) (Oral)   Ht 6\' 2"  (1.88 m)   Wt 202 lb 3.2 oz (91.7 kg)   BMI 25.96 kg/m   Physical Examination:  General: Awake, alert, no acute distress HEENT: Moist mucous membranes Cardio: Regular rate and rhythm.  S1, S2 heard.  No murmurs appreciated.. Pulmonary: Clear to auscultation bilaterally.  No wheezes, rhonchi rales.  Normal work of breathing on room air. GI: Abdomen protuberant.  Minimal epigastric tenderness to palpation.  No palpable masses. Extremities:  Warm, no edema.  No ulcerations.  Results for orders placed or performed in visit on 05/22/18 (from the past 24 hour(s))  Bayer DCA Hb A1c Waived     Status: Abnormal   Collection Time: 05/22/18 10:30 AM  Result Value Ref Range   HB A1C (BAYER DCA - WAIVED) 7.3 (H) <7.0 %   Narrative   Performed at:  [address] [place], [place]  [id number] Lab Director: Colletta [facility], Phone:  [phone]    Assessment/ Plan: 52 y.o. male   1. Type 2 diabetes mellitus with diabetic polyneuropathy, with long-term current use of insulin ([place]) Patient to schedule diabetic eye exam and have records sent to the office.  His A1c is quite a bit better than last visit.  It was 7.3 down from greater than 10.  He is having quite a few elevated blood sugars at home.  We discussed that he needs to go back on low-carb diet.  May increase Lantus by 1 unit every 3 days for fasting blood sugars greater than 120.  Hold off on any changes of the NovoLog.  He will follow-up with me in 3 months or sooner if needed. - Bayer DCA Hb A1c Waived  2. Microalbuminuria due to type 2 diabetes mellitus ([place]) Doing well and ACE inhibitor.  No problems with lisinopril 5 mg.  Blood pressure seems to be tolerating.  We will continue to monitor.  3. Slow transit constipation We discussed ways to improve constipation.  Increase water intake.  I have given him MiraLAX to use 1 capful mixed in 8 ounces daily as needed for constipation.  Reasons to contact myself or his gastroenterologist were reviewed with the patient.  He will follow-up as needed.   [name], DO [facility] [phone]

## 2018-05-22 NOTE — Addendum Note (Signed)
Addended by: [name] on: 05/22/2018 11:48 AM   Modules accepted: Level of Service

## 2018-06-17 ENCOUNTER — Emergency Department (HOSPITAL_COMMUNITY)
Admission: EM | Admit: 2018-06-17 | Discharge: 2018-06-17 | Disposition: A | Discharge status: Home / Self Care | Payer: Medicare HMO | Attending: Emergency Medicine | Admitting: Emergency Medicine

## 2018-06-17 ENCOUNTER — Encounter (HOSPITAL_COMMUNITY): Payer: Self-pay

## 2018-06-17 ENCOUNTER — Other Ambulatory Visit: Payer: Self-pay

## 2018-06-17 DIAGNOSIS — R109 Unspecified abdominal pain: Secondary | ICD-10-CM | POA: Insufficient documentation

## 2018-06-17 DIAGNOSIS — Z5321 Procedure and treatment not carried out due to patient leaving prior to being seen by health care provider: Secondary | ICD-10-CM | POA: Insufficient documentation

## 2018-06-17 LAB — CBC
HEMATOCRIT: 41.4 % (ref 39.0–52.0)
HEMOGLOBIN: 13.9 g/dL (ref 13.0–17.0)
MCH: 32.4 pg (ref 26.0–34.0)
MCHC: 33.6 g/dL (ref 30.0–36.0)
MCV: 96.5 fL (ref 78.0–100.0)
Platelets: 396 10*3/uL (ref 150–400)
RBC: 4.29 MIL/uL (ref 4.22–5.81)
RDW: 14 % (ref 11.5–15.5)
WBC: 8.3 10*3/uL (ref 4.0–10.5)

## 2018-06-17 LAB — URINALYSIS, ROUTINE W REFLEX MICROSCOPIC
BACTERIA UA: NONE SEEN
Bilirubin Urine: NEGATIVE
Glucose, UA: >=500 mg/dL — AB
Ketones, ur: NEGATIVE mg/dL
Leukocytes, UA: NEGATIVE
Nitrite: NEGATIVE
PROTEIN: 100 mg/dL — AB
Specific Gravity, Urine: 1.018 (ref 1.005–1.030)
pH: 5 (ref 5.0–8.0)

## 2018-06-17 LAB — COMPREHENSIVE METABOLIC PANEL
ALK PHOS: 28 U/L — AB (ref 38–126)
ALT: 16 U/L (ref 0–44)
ANION GAP: 8 (ref 5–15)
AST: 19 U/L (ref 15–41)
Albumin: 3.6 g/dL (ref 3.5–5.0)
BILIRUBIN TOTAL: 0.6 mg/dL (ref 0.3–1.2)
BUN: 12 mg/dL (ref 6–20)
CALCIUM: 9.1 mg/dL (ref 8.9–10.3)
CO2: 27 mmol/L (ref 22–32)
Chloride: 104 mmol/L (ref 98–111)
Creatinine, Ser: 1.41 mg/dL — ABNORMAL HIGH (ref 0.61–1.24)
GFR calc non Af Amer: 55 mL/min — ABNORMAL LOW (ref >60)
Glucose, Bld: 229 mg/dL — ABNORMAL HIGH (ref 70–99)
Potassium: 4.5 mmol/L (ref 3.5–5.1)
SODIUM: 139 mmol/L (ref 135–145)
TOTAL PROTEIN: 6.3 g/dL — AB (ref 6.5–8.1)

## 2018-06-17 LAB — LIPASE, BLOOD: Lipase: 20 U/L (ref 11–51)

## 2018-06-17 NOTE — ED Notes (Signed)
Patient didn't want to wait to be assessed by provider, patient ambulated out of ED accompanied by wife, no distress noted, EDP Tomi Bamberger, made aware.

## 2018-06-17 NOTE — ED Triage Notes (Signed)
Pt brought to ED with complaints of abdominal pain and difficulty passing urine which started yesterday. Pt states only able to urinate a few drops at a time. Pt c/o cramping in lower abd.

## 2018-06-18 NOTE — ED Provider Notes (Signed)
Pt not in room    Rolland Porter, MD 06/18/18 0100

## 2018-07-11 ENCOUNTER — Other Ambulatory Visit: Payer: Self-pay | Admitting: *Deleted

## 2018-07-11 DIAGNOSIS — E785 Hyperlipidemia, unspecified: Secondary | ICD-10-CM

## 2018-07-11 MED ORDER — ATORVASTATIN CALCIUM 80 MG PO TABS: 80.0000 mg | ORAL_TABLET | Freq: Every day | ORAL | 1 refills | Status: DC

## 2018-07-11 NOTE — Telephone Encounter (Signed)
08/22/18 

## 2018-08-06 ENCOUNTER — Emergency Department (HOSPITAL_COMMUNITY)
Admission: EM | Admit: 2018-08-06 | Discharge: 2018-08-06 | Disposition: A | Discharge status: Home / Self Care | Payer: Medicare HMO | Attending: Emergency Medicine | Admitting: Emergency Medicine

## 2018-08-06 ENCOUNTER — Encounter (HOSPITAL_COMMUNITY): Payer: Self-pay | Admitting: Emergency Medicine

## 2018-08-06 ENCOUNTER — Other Ambulatory Visit: Payer: Self-pay

## 2018-08-06 ENCOUNTER — Emergency Department (HOSPITAL_COMMUNITY): Payer: Medicare HMO

## 2018-08-06 DIAGNOSIS — Z79899 Other long term (current) drug therapy: Secondary | ICD-10-CM | POA: Diagnosis not present

## 2018-08-06 DIAGNOSIS — Z955 Presence of coronary angioplasty implant and graft: Secondary | ICD-10-CM | POA: Diagnosis not present

## 2018-08-06 DIAGNOSIS — I2510 Atherosclerotic heart disease of native coronary artery without angina pectoris: Secondary | ICD-10-CM | POA: Insufficient documentation

## 2018-08-06 DIAGNOSIS — E1143 Type 2 diabetes mellitus with diabetic autonomic (poly)neuropathy: Secondary | ICD-10-CM | POA: Insufficient documentation

## 2018-08-06 DIAGNOSIS — R102 Pelvic and perineal pain: Secondary | ICD-10-CM | POA: Insufficient documentation

## 2018-08-06 DIAGNOSIS — Z7982 Long term (current) use of aspirin: Secondary | ICD-10-CM | POA: Insufficient documentation

## 2018-08-06 DIAGNOSIS — R300 Dysuria: Secondary | ICD-10-CM | POA: Insufficient documentation

## 2018-08-06 DIAGNOSIS — J449 Chronic obstructive pulmonary disease, unspecified: Secondary | ICD-10-CM | POA: Insufficient documentation

## 2018-08-06 DIAGNOSIS — N400 Enlarged prostate without lower urinary tract symptoms: Secondary | ICD-10-CM | POA: Diagnosis not present

## 2018-08-06 DIAGNOSIS — F17210 Nicotine dependence, cigarettes, uncomplicated: Secondary | ICD-10-CM | POA: Diagnosis not present

## 2018-08-06 DIAGNOSIS — I10 Essential (primary) hypertension: Secondary | ICD-10-CM | POA: Insufficient documentation

## 2018-08-06 DIAGNOSIS — R1084 Generalized abdominal pain: Secondary | ICD-10-CM

## 2018-08-06 DIAGNOSIS — Z794 Long term (current) use of insulin: Secondary | ICD-10-CM | POA: Insufficient documentation

## 2018-08-06 LAB — URINALYSIS, ROUTINE W REFLEX MICROSCOPIC
Bacteria, UA: NONE SEEN
Bilirubin Urine: NEGATIVE
KETONES UR: NEGATIVE mg/dL
LEUKOCYTES UA: NEGATIVE
NITRITE: NEGATIVE
PH: 5 (ref 5.0–8.0)
PROTEIN: 100 mg/dL — AB
Specific Gravity, Urine: 1.022 (ref 1.005–1.030)

## 2018-08-06 LAB — BASIC METABOLIC PANEL
ANION GAP: 10 (ref 5–15)
BUN: 9 mg/dL (ref 6–20)
CALCIUM: 9.2 mg/dL (ref 8.9–10.3)
CO2: 26 mmol/L (ref 22–32)
Chloride: 98 mmol/L (ref 98–111)
Creatinine, Ser: 1.49 mg/dL — ABNORMAL HIGH (ref 0.61–1.24)
GFR, EST NON AFRICAN AMERICAN: 52 mL/min — AB (ref >60)
Glucose, Bld: 306 mg/dL — ABNORMAL HIGH (ref 70–99)
POTASSIUM: 4.4 mmol/L (ref 3.5–5.1)
SODIUM: 134 mmol/L — AB (ref 135–145)

## 2018-08-06 LAB — CBC WITH DIFFERENTIAL/PLATELET
ABS IMMATURE GRANULOCYTES: 0.03 10*3/uL (ref 0.00–0.07)
BASOS ABS: 0 10*3/uL (ref 0.0–0.1)
BASOS PCT: 0 %
EOS ABS: 0.1 10*3/uL (ref 0.0–0.5)
Eosinophils Relative: 1 %
HCT: 45.3 % (ref 39.0–52.0)
Hemoglobin: 15.1 g/dL (ref 13.0–17.0)
IMMATURE GRANULOCYTES: 0 %
Lymphocytes Relative: 29 %
Lymphs Abs: 2.2 10*3/uL (ref 0.7–4.0)
MCH: 32.7 pg (ref 26.0–34.0)
MCHC: 33.3 g/dL (ref 30.0–36.0)
MCV: 98.1 fL (ref 80.0–100.0)
MONOS PCT: 9 %
Monocytes Absolute: 0.7 10*3/uL (ref 0.1–1.0)
NEUTROS ABS: 4.7 10*3/uL (ref 1.7–7.7)
NEUTROS PCT: 61 %
NRBC: 0 % (ref 0.0–0.2)
PLATELETS: 471 10*3/uL — AB (ref 150–400)
RBC: 4.62 MIL/uL (ref 4.22–5.81)
RDW: 13.5 % (ref 11.5–15.5)
WBC: 7.7 10*3/uL (ref 4.0–10.5)

## 2018-08-06 MED ORDER — CIPROFLOXACIN HCL 250 MG PO TABS
500.0000 mg | ORAL_TABLET | Freq: Once | ORAL | Status: AC
  Administered 2018-08-06: 500 mg via ORAL
  Filled 2018-08-06: qty 2

## 2018-08-06 MED ORDER — CIPROFLOXACIN HCL 250 MG PO TABS: 500.0000 mg | ORAL_TABLET | Freq: Once | ORAL | Status: DC

## 2018-08-06 MED ORDER — CIPROFLOXACIN HCL 500 MG PO TABS: 500.0000 mg | ORAL_TABLET | Freq: Two times a day (BID) | ORAL | 0 refills | Status: DC

## 2018-08-06 NOTE — ED Triage Notes (Signed)
Patient complaining of lower back pain and painful urination x 2 days.

## 2018-08-06 NOTE — ED Provider Notes (Signed)
[facility] EMERGENCY DEPARTMENT Provider Note   CSN: [id number] Arrival date & time: 08/06/18  1526     History   Chief Complaint Chief Complaint  Patient presents with  . Back Pain  . Dysuria    HPI Dustin Cruz is a 52 y.o. male.  HPI  Pt was seen at [place].  Per pt, c/o gradual onset and persistence of constant generalized abd "pain" since yesterday. Pt states the pain started in his bilat lower back areas, and now is located in his abd. States his abd feels "tight." Pt states he feels like he "isn't urinating a lot."  Pt had one episode of N/V.  Denies diarrhea, no fevers, no back pain, no rash, no CP/SOB, no black or blood in stools or emesis, no dysuria/hematuria, no testicular pain/swelling.      Past Medical History:  Diagnosis Date  . Anxiety   . Asthma   . COPD (chronic obstructive pulmonary disease) ([place])   . Coronary artery disease    s/p BMS to [place] 10/2010;  Cath 01/22/12 prox 30-40% LAD, LCx ramus w/ hazy 70-80% in-stent restenosis, EF 60% treated medically  . Coronary artery disease    s/p BMS to [place] 10/2010;  Cath 01/22/12 prox 30-40% LAD, LCx ramus w/ hazy 70-80% in-stent restenosis, EF 60% treated medically   . Depression   . Diabetes mellitus    Type 2  . Gastroparesis 05/2013  . GERD (gastroesophageal reflux disease)   . HTN (hypertension)   . Hyperlipidemia   . Hyperlipidemia   . Major depression, chronic 08/13/2012  . Melanoma ([place]) 2007   surgery at [facility], Followed by [place]  . Myocardial infarction ([place]) 2012  . Obesity   . Tobacco abuse   . Tubular adenoma     Patient Active Problem List   Diagnosis Date Noted  . Microalbuminuria due to type 2 diabetes mellitus ([place]) 05/22/2018  . Hyperglycemia 04/24/2018  . Abdominal pain, epigastric 11/29/2017  . Diarrhea 11/29/2017  . History of colonic polyps 11/29/2017  . Diffuse abdominal pain 11/29/2017  . Diabetic retinopathy ([place]) 09/21/2015  . Carotid bruit 09/21/2015  . Viral  gastroenteritis 06/03/2015  . Hypertriglyceridemia 05/18/2015  . Vitamin D deficiency 12/29/2013  . Nausea alone 04/24/2013  . Frequency of urination 04/14/2013  . Fever, unspecified 04/14/2013  . Hepatic steatosis 12/28/2012  . Peripheral neuropathy 11/25/2012  . Melanoma of skin, site unspecified 11/25/2012  . Hyperlipidemia   . Depression   . Hypertension associated with diabetes ([place])   . GERD (gastroesophageal reflux disease) 04/03/2012  . Tobacco abuse 03/14/2012  . Diabetes mellitus, type 2 ([place])   . 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 BIOPSY  01/07/2018   Procedure: BIOPSY;  Surgeon: Daneil Dolin, MD;  Location: [facility] ENDO SUITE;  Service: Endoscopy;;  duodenal and gastric biopsy  . BIOPSY  04/25/2018   Procedure: BIOPSY;  Surgeon: Daneil Dolin, MD;  Location: [facility] ENDO SU[facility];  Service: Endoscopy;;  ascending and descending and sigmoid biopsies  . CARDIAC CATHETERIZATION     with stent  . COLONOSCOPY N/A 01/27/2013   FBP:ZWCHEN and colonic polyps. Tubular adenomas, poor bowel prep, one-year follow-up surveillance colonoscopy recommended  . COLONOSCOPY WITH PROPOFOL N/A 04/25/2018   Procedure: COLONOSCOPY WITH PROPOFOL;  Surgeon: Daneil Dolin, MD;  Location: [facility] ENDO SU[facility];  Service: Endoscopy;  Laterality: N/A;  11:00am - pt to be prepped INPT and labs to be done  as well  . CORONARY ANGIOPLASTY WITH STENT PLACEMENT    . ESOPHAGOGASTRODUODENOSCOPY  04/24/2012   [place]-mild erosive reflux esophagitis,dilated w/36F Venia Minks, small HH, minimal chronic gastric/bulbar erosions(No H pylori)  . ESOPHAGOGASTRODUODENOSCOPY (EGD) WITH PROPOFOL N/A 01/07/2018   Dr. Gala Romney: Erythematous mucosa in the stomach, retained gastric contents, incomplete exam.  Biopsied showed mild chronic gastritis but no H. pylori.  Duodenal biopsy was negative for celiac disease.  [name] FLEXIBLE SIGMOIDOSCOPY N/A 01/07/2018   Dr. Gala Romney:  Incomplete colonoscopy due to inadequate bowel prep  . LEFT HEART CATHETERIZATION WITH CORONARY ANGIOGRAM N/A 01/22/2012   Procedure: LEFT HEART CATHETERIZATION WITH CORONARY ANGIOGRAM;  Surgeon: Jolaine Artist, MD;  Location: [facility] C[facility] LAB;  Service: Cardiovascular;  Laterality: N/A;  . melanoma surgery  2007   [place], removed lymph nodes under arm as well,Left abd        Home Medications    Prior to Admission medications   Medication Sig Start Date End Date Taking? Authorizing Provider  albuterol (PROVENTIL HFA;VENTOLIN HFA) 108 (90 Base) MCG/ACT inhaler Inhale 2 puffs into the lungs every 6 (six) hours as needed for wheezing. 01/15/17   Timmothy Euler, MD  albuterol (PROVENTIL) (2.5 MG/3ML) 0.083% nebulizer solution Take 3 mLs (2.5 mg total) by nebulization every 6 (six) hours as needed for wheezing or shortness of breath. 10/15/17   Timmothy Euler, MD  aspirin EC 81 MG tablet Take 81 mg by mouth daily.    [provider]  atorvastatin (LIPITOR) 80 MG tablet Take 1 tablet (80 mg total) by mouth daily. 07/11/18   [name], DO  busPIRone (BUSPAR) 7.5 MG tablet TAKE 1 TABLET BY MOUTH TWICE DAILY Patient taking differently: Take 7.5 mg by mouth 2 (two) times daily.  04/09/18   [name], DO  fenofibrate (TRICOR) 145 MG tablet Take 1 tablet (145 mg total) by mouth daily. 04/17/18   [name], DO  furosemide (LASIX) 20 MG tablet Take 1 tablet (20 mg total) by mouth daily. 10/07/17   Julianne Rice, MD  gabapentin (NEURONTIN) 800 MG tablet Take 1 tablet (800 mg total) by mouth 3 (three) times daily. 09/21/17   Timmothy Euler, MD  insulin aspart (NOVOLOG FLEXPEN) 100 UNIT/ML FlexPen Inject 10 Units into the skin 3 (three) times daily with meals. Inject up to 10 units prior to meal per sliding scale. 09/21/17   Timmothy Euler, MD  Insulin Glargine (LANTUS SOLOSTAR) 100 UNIT/ML Solostar Pen Inject 40 Units into the skin daily. Patient taking  differently: Inject 45-48 Units into the skin daily.  05/10/18   [name], DO  Linagliptin-Metformin HCl (JENTADUETO) 2.02-999 MG TABS Take 1 tablet by mouth 2 (two) times daily with a meal. 09/21/17   Timmothy Euler, MD  lisinopril (PRINIVIL,ZESTRIL) 5 MG tablet Take 1 tablet (5 mg total) by mouth at bedtime. 05/22/18   [name], DO  loratadine (CLARITIN) 10 MG tablet Take 10 mg by mouth daily as needed for allergies.    [provider]  nitroGLYCERIN (NITROSTAT) 0.4 MG SL tablet Place 1 tablet (0.4 mg total) under the tongue every 5 (five) minutes as needed. For chest pain. 08/13/12   Reece Packer, NP  omeprazole (PRILOSEC) 40 MG capsule TAKE 1 CAPSULE BY MOUTH TWICE DAILY Patient taking differently: TAKE 1 CAPSULE BY MOUTH at bedtime 04/09/18   Ronnie Doss M, DO  polyethylene glycol powder (GLYCOLAX/MIRALAX) powder Mix 1 capful in 8 ounces of water and drink daily for  constipation. Patient taking differently: Take 17 g by mouth daily as needed for mild constipation or moderate constipation. Mix 1 capful in 8 ounces of water and drink daily for constipation. 05/22/18   [name], DO  potassium chloride (K-DUR) 10 MEQ tablet Take 1 tablet (10 mEq total) by mouth daily. 10/07/17   Julianne Rice, MD  sertraline (ZOLOFT) 100 MG tablet Take 1 tablet (100 mg total) by mouth daily. 09/21/17   Timmothy Euler, MD  tiZANidine (ZANAFLEX) 4 MG tablet Take 1 tablet (4 mg total) by mouth every 8 (eight) hours as needed for muscle spasms. 02/18/18   [name], DO  traZODone (DESYREL) 150 MG tablet Take 1 tablet (150 mg total) by mouth at bedtime. 02/18/18   [name], DO  metoprolol tartrate (LOPRESSOR) 25 MG tablet Take 25 mg by mouth 2 (two) times daily.    12/27/11  [provider]    Family History Family History  Problem Relation Age of Onset  . Stroke Mother   . Alcohol abuse Father   . Lung cancer Unknown   . Heart  disease Unknown   . Other Unknown        not real familiar with family history  . Colon cancer Neg Hx   . Colon polyps Neg Hx     Social History Social History   Tobacco Use  . Smoking status: Current Every Day Smoker    Packs/day: 1.00    Years: 30.00    [place] years: 30.00    Types: Cigarettes    Start date: 10/09/1981  . Smokeless tobacco: Never Used  . Tobacco comment: 1/2 [place] daily 11/01/17  Substance Use Topics  . 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  . Drug use: No     Allergies   Patient has no known allergies.   Review of Systems Review of Systems ROS: Statement: All systems negative except as marked or noted in the HPI; Constitutional: Negative for fever and chills. ; ; Eyes: Negative for eye pain, redness and discharge. ; ; ENMT: Negative for ear pain, hoarseness, nasal congestion, sinus pressure and sore throat. ; ; Cardiovascular: Negative for chest pain, palpitations, diaphoresis, dyspnea and peripheral edema. ; ; Respiratory: Negative for cough, wheezing and stridor. ; ; Gastrointestinal: +N/V, abd pain. Negative for diarrhea, blood in stool, hematemesis, jaundice and rectal bleeding. . ; ; Genitourinary: +decreased urination. Negative for dysuria, flank pain and hematuria. ; ; Genital:  No penile drainage or rash, no testicular pain or swelling, no scrotal rash or swelling. ;; Musculoskeletal: +LBP. Negative for neck pain. Negative for swelling and trauma.; ; Skin: Negative for pruritus, rash, abrasions, blisters, bruising and skin lesion.; ; Neuro: Negative for headache, lightheadedness and neck stiffness. Negative for weakness, altered level of consciousness, altered mental status, extremity weakness, paresthesias, involuntary movement, seizure and syncope.       Physical Exam Updated Vital Signs BP 132/65 (BP Location: Right Arm)    Pulse 95   Temp 98 F (36.7 C) (Oral)   Resp 18   Ht 6\' 2"  (1.88 m)   Wt 94.3 kg   SpO2 99%   BMI 26.71 kg/m   Physical Exam 1950: Physical examination:  Nursing notes reviewed; Vital signs and O2 SAT reviewed;  Constitutional: Well developed, Well nourished, Well hydrated, In no acute distress; Head:  Normocephalic, atraumatic; Eyes: EOMI, PERRL, No scleral icterus; ENMT: Mouth and pharynx normal, Mucous membranes moist; Neck: Supple, Full range of motion, No lymphadenopathy; Cardiovascular: Regular rate and rhythm, No gallop; Respiratory: Breath sounds clear & equal bilaterally, No wheezes.  Speaking full sentences with ease, Normal respiratory effort/excursion; Chest: Nontender, Movement normal; Abdomen: Soft, +mild generalized tenderness to palp. No rebound or guarding. Nondistended, Normal bowel sounds; Genitourinary: No CVA tenderness; Spine:  No midline CS, TS, LS tenderness.;; Extremities: Peripheral pulses normal, No tenderness, No edema, No calf edema or asymmetry.; Neuro: AA&Ox3, Major CN grossly intact.  Speech clear. No gross focal motor or sensory deficits in extremities.; Skin: Color normal, Warm, Dry.    ED Treatments / Results  Labs (all labs ordered are listed, but only abnormal results are displayed)   EKG None  Radiology   Procedures Procedures (including critical care time)  Medications Ordered in ED Medications - No data to display   Initial Impression / Assessment and Plan / ED Course  I have reviewed the triage vital signs and the nursing notes.  Pertinent labs & imaging results that were available during my care of the patient were reviewed by me and considered in my medical decision making (see chart for details).  MDM Reviewed: previous chart, nursing note and vitals Reviewed previous: labs Interpretation: labs and CT scan   Results for orders placed or performed during the hospital encounter of 08/06/18  Urinalysis, Routine w reflex  microscopic  Result Value Ref Range   Color, Urine YELLOW YELLOW   APPearance CLEAR CLEAR   Specific Gravity, Urine 1.022 1.005 - 1.030   pH 5.0 5.0 - 8.0   Glucose, UA >=500 (A) NEGATIVE mg/dL   Hgb urine dipstick SMALL (A) NEGATIVE   Bilirubin Urine NEGATIVE NEGATIVE   Ketones, ur NEGATIVE NEGATIVE mg/dL   Protein, ur 100 (A) NEGATIVE mg/dL   Nitrite NEGATIVE NEGATIVE   Leukocytes, UA NEGATIVE NEGATIVE   RBC / HPF 0-5 0 - 5 RBC/hpf   WBC, UA 0-5 0 - 5 WBC/hpf   Bacteria, UA NONE SEEN NONE SEEN  CBC with Differential  Result Value Ref Range   WBC 7.7 4.0 - 10.5 K/uL   RBC 4.62 4.22 - 5.81 MIL/uL   Hemoglobin 15.1 13.0 - 17.0 g/dL   HCT 45.3 39.0 - 52.0 %   MCV 98.1 80.0 - 100.0 fL   MCH 32.7 26.0 - 34.0 pg   MCHC 33.3 30.0 - 36.0 g/dL   RDW 13.5 11.5 - 15.5 %   Platelets 471 (H) 150 - 400 K/uL   nRBC 0.0 0.0 - 0.2 %   Neutrophils Relative % 61 %   Neutro Abs 4.7 1.7 - 7.7 K/uL   Lymphocytes Relative 29 %   Lymphs Abs 2.2 0.7 - 4.0 K/uL   Monocytes Relative 9 %   Monocytes Absolute 0.7 0.1 - 1.0 K/uL   Eosinophils Relative 1 %   Eosinophils Absolute 0.1 0.0 - 0.5 K/uL   Basophils Relative 0 %   Basophils Absolute 0.0 0.0 - 0.1 K/uL   Immature Granulocytes 0 %   Abs Immature Granulocytes 0.03 0.00 - 0.07 K/uL  Basic metabolic panel  Result Value Ref Range   Sodium 134 (L) 135 - 145 mmol/L   Potassium 4.4 3.5 - 5.1 mmol/L   Chloride 98 98 - 111 mmol/L   CO2 26 22 - 32 mmol/L   Glucose, Bld 306 (H) 70 - 99 mg/dL   BUN 9 6 - 20  mg/dL   Creatinine, Ser 1.49 (H) 0.61 - 1.24 mg/dL   Calcium 9.2 8.9 - 10.3 mg/dL   GFR calc non Af Amer 52 (L) >60 mL/min   GFR calc Af Amer >60 >60 mL/min   Anion gap 10 5 - 15   Ct Renal Stone Study Result Date: 08/06/2018 CLINICAL DATA:  Low back, pelvic pain and difficulty urinating for 2 days. History of gastroparesis and diabetes. EXAM: CT ABDOMEN AND PELVIS WITHOUT CONTRAST TECHNIQUE: Multidetector CT imaging of the abdomen and  pelvis was performed following the standard protocol without IV contrast. COMPARISON:  CT abdomen and pelvis October 28, 2017 FINDINGS: LOWER CHEST: Lung bases are clear. The visualized heart size is normal. No pericardial effusion. HEPATOBILIARY: Negative liver.  Decompressed gallbladder. PANCREAS: Normal. SPLEEN: Normal. ADRENALS/URINARY TRACT: Kidneys are orthotopic, demonstrating normal size and morphology. No nephrolithiasis, hydronephrosis; limited assessment for renal masses by nonenhanced CT. The unopacified ureters are normal in course and caliber. Urinary bladder is well distended and unremarkable. Normal adrenal glands. STOMACH/BOWEL: The stomach, small and large bowel are normal in course and caliber without inflammatory changes, sensitivity decreased by lack of enteric contrast. Normal appendix. VASCULAR/LYMPHATIC: Aortoiliac vessels are normal in course and caliber. Mild calcific atherosclerosis. No lymphadenopathy by CT size criteria. REPRODUCTIVE: Mild prostatomegaly. OTHER: No intraperitoneal free fluid or free air. MUSCULOSKELETAL: Non-acute.  Mild sacroiliac osteoarthrosis. IMPRESSION: 1. No nephrolithiasis, obstructive uropathy nor acute intra-abdominal/pelvic process. 2. Mild prostatomegaly. 3.  Aortic Atherosclerosis (ICD10-I70.0). Electronically Signed   By: Elon Alas M.D.   On: 08/06/2018 20:57    2100:  Bladder scan with 123ml urine. CT reassuring. Pt has tol PO well while in the ED without N/V. No stooling while in the ED. Abd benign, VSS. Pt now states he "had some burning" when he urinated today. Denies penile discharge, testicular pain or perineal pain. No urinary retention while in the ED.  Udip without clear UTI.  Will tx while UC pending. Dx and testing d/w pt.  Questions answered.  Verb understanding, agreeable to d/c home with outpt f/u.     Final Clinical Impressions(s) / ED Diagnoses   Final diagnoses:  None    ED Discharge Orders    None       Francine Graven, DO 08/08/18 2135

## 2018-08-06 NOTE — Discharge Instructions (Signed)
Take the prescription as directed.  Call your regular medical doctor tomorrow to schedule a follow up appointment this week. Call the Urologist tomorrow to schedule a follow up appointment within the next week. Call your GI doctor tomorrow to schedule a follow up appointment within the next few weeks.  Return to the Emergency Department immediately sooner if worsening.

## 2018-08-06 NOTE — ED Notes (Signed)
Bladder scan showed less than 157mL of urine.

## 2018-08-07 ENCOUNTER — Other Ambulatory Visit: Payer: Self-pay | Admitting: Family Medicine

## 2018-08-08 LAB — URINE CULTURE: CULTURE: NO GROWTH

## 2018-08-11 ENCOUNTER — Encounter (HOSPITAL_COMMUNITY): Payer: Self-pay | Admitting: Emergency Medicine

## 2018-08-11 ENCOUNTER — Other Ambulatory Visit: Payer: Self-pay

## 2018-08-11 ENCOUNTER — Emergency Department (HOSPITAL_COMMUNITY)
Admission: EM | Admit: 2018-08-11 | Discharge: 2018-08-12 | Disposition: A | Discharge status: Home / Self Care | Payer: Medicare HMO | Attending: Emergency Medicine | Admitting: Emergency Medicine

## 2018-08-11 DIAGNOSIS — Z7982 Long term (current) use of aspirin: Secondary | ICD-10-CM | POA: Diagnosis not present

## 2018-08-11 DIAGNOSIS — L739 Follicular disorder, unspecified: Secondary | ICD-10-CM

## 2018-08-11 DIAGNOSIS — R339 Retention of urine, unspecified: Secondary | ICD-10-CM | POA: Diagnosis present

## 2018-08-11 DIAGNOSIS — E1143 Type 2 diabetes mellitus with diabetic autonomic (poly)neuropathy: Secondary | ICD-10-CM | POA: Diagnosis not present

## 2018-08-11 DIAGNOSIS — F17210 Nicotine dependence, cigarettes, uncomplicated: Secondary | ICD-10-CM | POA: Insufficient documentation

## 2018-08-11 DIAGNOSIS — J449 Chronic obstructive pulmonary disease, unspecified: Secondary | ICD-10-CM | POA: Diagnosis not present

## 2018-08-11 DIAGNOSIS — I2510 Atherosclerotic heart disease of native coronary artery without angina pectoris: Secondary | ICD-10-CM | POA: Insufficient documentation

## 2018-08-11 DIAGNOSIS — Z79899 Other long term (current) drug therapy: Secondary | ICD-10-CM | POA: Diagnosis not present

## 2018-08-11 DIAGNOSIS — Z794 Long term (current) use of insulin: Secondary | ICD-10-CM | POA: Diagnosis not present

## 2018-08-11 DIAGNOSIS — I10 Essential (primary) hypertension: Secondary | ICD-10-CM | POA: Insufficient documentation

## 2018-08-11 DIAGNOSIS — Z955 Presence of coronary angioplasty implant and graft: Secondary | ICD-10-CM | POA: Diagnosis not present

## 2018-08-11 DIAGNOSIS — N410 Acute prostatitis: Secondary | ICD-10-CM | POA: Insufficient documentation

## 2018-08-11 HISTORY — DX: Retention of urine, unspecified: R33.9

## 2018-08-11 MED ORDER — PHENAZOPYRIDINE HCL 100 MG PO TABS
200.0000 mg | ORAL_TABLET | Freq: Once | ORAL | Status: AC
  Administered 2018-08-12: 200 mg via ORAL
  Filled 2018-08-11: qty 2

## 2018-08-11 MED ORDER — SULFAMETHOXAZOLE-TRIMETHOPRIM 800-160 MG PO TABS: 1.0000 | ORAL_TABLET | Freq: Two times a day (BID) | ORAL | 0 refills | Status: DC

## 2018-08-11 MED ORDER — IBUPROFEN 800 MG PO TABS: 800.0000 mg | ORAL_TABLET | Freq: Four times a day (QID) | ORAL | 0 refills | Status: DC | PRN

## 2018-08-11 MED ORDER — SULFAMETHOXAZOLE-TRIMETHOPRIM 800-160 MG PO TABS
1.0000 | ORAL_TABLET | Freq: Once | ORAL | Status: AC
  Administered 2018-08-12: 1 via ORAL
  Filled 2018-08-11: qty 1

## 2018-08-11 MED ORDER — TAMSULOSIN HCL 0.4 MG PO CAPS: 0.4000 mg | ORAL_CAPSULE | Freq: Every day | ORAL | 0 refills | Status: DC

## 2018-08-11 NOTE — ED Triage Notes (Signed)
Pt states he hasn't been able to void all day today. Pt also has 5 small bumps in his pubic hair that he describes as very painful.

## 2018-08-11 NOTE — ED Provider Notes (Signed)
[facility] EMERGENCY DEPARTMENT Provider Note   CSN: [id number] Arrival date & time: 08/11/18  2247     History   Chief Complaint Chief Complaint  Patient presents with  . Urinary Retention    HPI Dustin Cruz is a 52 y.o. male.  Patient presents to the emergency department for evaluation of urinary retention.  Patient reports that he has only been able to pass small amounts of urine through the course of today.  He was seen in the ER several days ago with mild burning with urination, placed on Cipro.  Patient reports no improvement.  Patient also reports several painful bumps in the area of his pubic hair that has been present for 1 or 2 days.  He has never had similar problems before.     Past Medical History:  Diagnosis Date  . Anxiety   . Asthma   . COPD (chronic obstructive pulmonary disease) ([place])   . Coronary artery disease    s/p BMS to Ramus 10/2010;  Cath 01/22/12 prox 30-40% LAD, LCx ramus w/ hazy 70-80% in-stent restenosis, EF 60% treated medically  . Coronary artery disease    s/p BMS to Ramus 10/2010;  Cath 01/22/12 prox 30-40% LAD, LCx ramus w/ hazy 70-80% in-stent restenosis, EF 60% treated medically   . Depression   . Diabetes mellitus    Type 2  . Gastroparesis 05/2013  . GERD (gastroesophageal reflux disease)   . HTN (hypertension)   . Hyperlipidemia   . Hyperlipidemia   . Major depression, chronic 08/13/2012  . Melanoma ([place]) 2007   surgery at [facility], Followed by [facility]  . Myocardial infarction ([place]) 2012  . Obesity   . Tobacco abuse   . Tubular adenoma   . Urinary retention     Patient Active Problem List   Diagnosis Date Noted  . Microalbuminuria due to type 2 diabetes mellitus ([place]) 05/22/2018  . Hyperglycemia 04/24/2018  . Abdominal pain, epigastric 11/29/2017  . Diarrhea 11/29/2017  . History of colonic polyps 11/29/2017  . Diffuse abdominal pain 11/29/2017  . Diabetic retinopathy ([place]) 09/21/2015  . Carotid bruit 09/21/2015  .  Viral gastroenteritis 06/03/2015  . Hypertriglyceridemia 05/18/2015  . Vitamin D deficiency 12/29/2013  . Nausea alone 04/24/2013  . Frequency of urination 04/14/2013  . Fever, unspecified 04/14/2013  . Hepatic steatosis 12/28/2012  . Peripheral neuropathy 11/25/2012  . Melanoma of skin, site unspecified 11/25/2012  . Hyperlipidemia   . Depression   . Hypertension associated with diabetes ([place])   . GERD (gastroesophageal reflux disease) 04/03/2012  . Tobacco abuse 03/14/2012  . Diabetes mellitus, type 2 ([place])   . 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 BIOPSY  01/07/2018   Procedure: BIOPSY;  Surgeon: Daneil Dolin, MD;  Location: [facility] ENDO SUITE;  Service: Endoscopy;;  duodenal and gastric biopsy  . BIOPSY  04/25/2018   Procedure: BIOPSY;  Surgeon: Daneil Dolin, MD;  Location: [facility] ENDO SU[facility];  Service: Endoscopy;;  ascending and descending and sigmoid biopsies  . CARDIAC CATHETERIZATION     with stent  . COLONOSCOPY N/A 01/27/2013   OXB:[id number] and colonic polyps. Tubular adenomas, poor bowel prep, one-year follow-up surveillance colonoscopy recommended  . COLONOSCOPY WITH PROPOFOL N/A 04/25/2018   Procedure: COLONOSCOPY WITH PROPOFOL;  Surgeon: Daneil Dolin, MD;  Location: [facility] END[facility] SU[facility];  Service: Endoscopy;  Laterality: N/A;  11:00am - pt to be prepped INPT and labs to be done as  well  . CORONARY ANGIOPLASTY WITH STENT PLACEMENT    . ESOPHAGOGASTRODUODENOSCOPY  04/24/2012   [place]-mild erosive reflux esophagitis,dilated w/70F Venia Minks, small HH, minimal chronic gastric/bulbar erosions(No H pylori)  . ESOPHAGOGASTRODUODENOSCOPY (EGD) WITH PROPOFOL N/A 01/07/2018   Dr. Gala Romney: Erythematous mucosa in the stomach, retained gastric contents, incomplete exam.  Biopsied showed mild chronic gastritis but no H. pylori.  Duodenal biopsy was negative for celiac disease.  [name] FLEXIBLE SIGMOIDOSCOPY N/A 01/07/2018   Dr. Gala Romney:  Incomplete colonoscopy due to inadequate bowel prep  . LEFT HEART CATHETERIZATION WITH CORONARY ANGIOGRAM N/A 01/22/2012   Procedure: LEFT HEART CATHETERIZATION WITH CORONARY ANGIOGRAM;  Surgeon: Jolaine Artist, MD;  Location[facility] C[facility] LAB;  Service: Cardiovascular;  Laterality: N/A;  . melanoma surgery  2007   [place], removed lymph nodes under arm as well,Left abd        Home Medications    Prior to Admission medications   Medication Sig Start Date End Date Taking? Authorizing Provider  albuterol (PROVENTIL HFA;VENTOLIN HFA) 108 (90 Base) MCG/ACT inhaler Inhale 2 puffs into the lungs every 6 (six) hours as needed for wheezing. 01/15/17   Timmothy Euler, MD  albuterol (PROVENTIL) (2.5 MG/3ML) 0.083% nebulizer solution Take 3 mLs (2.5 mg total) by nebulization every 6 (six) hours as needed for wheezing or shortness of breath. 10/15/17   Timmothy Euler, MD  aspirin EC 81 MG tablet Take 81 mg by mouth daily.    [provider]  atorvastatin (LIPITOR) 80 MG tablet Take 1 tablet (80 mg total) by mouth daily. 07/11/18   [name], DO  busPIRone (BUSPAR) 7.5 MG tablet TAKE 1 TABLET BY MOUTH TWICE DAILY 08/11/18   Ronnie Doss M, DO  ciprofloxacin (CIPRO) 500 MG tablet Take 1 tablet (500 mg total) by mouth 2 (two) times daily. 08/06/18   Francine Graven, DO  fenofibrate (TRICOR) 145 MG tablet Take 1 tablet (145 mg total) by mouth daily. 04/17/18   [name], DO  furosemide (LASIX) 20 MG tablet Take 1 tablet (20 mg total) by mouth daily. 10/07/17   Julianne Rice, MD  gabapentin (NEURONTIN) 800 MG tablet Take 1 tablet (800 mg total) by mouth 3 (three) times daily. 09/21/17   Timmothy Euler, MD  insulin aspart (NOVOLOG FLEXPEN) 100 UNIT/ML FlexPen Inject 10 Units into the skin 3 (three) times daily with meals. Inject up to 10 units prior to meal per sliding scale. 09/21/17   Timmothy Euler, MD  Insulin Glargine (LANTUS SOLOSTAR) 100 UNIT/ML Solostar Pen  Inject 40 Units into the skin daily. Patient taking differently: Inject 45-48 Units into the skin daily.  05/10/18   [name], DO  Linagliptin-Metformin HCl (JENTADUETO) 2.02-999 MG TABS Take 1 tablet by mouth 2 (two) times daily with a meal. 09/21/17   Timmothy Euler, MD  lisinopril (PRINIVIL,ZESTRIL) 5 MG tablet Take 1 tablet (5 mg total) by mouth at bedtime. 05/22/18   [name], DO  loratadine (CLARITIN) 10 MG tablet Take 10 mg by mouth daily as needed for allergies.    [provider]  nitroGLYCERIN (NITROSTAT) 0.4 MG SL tablet Place 1 tablet (0.4 mg total) under the tongue every 5 (five) minutes as needed. For chest pain. 08/13/12   Reece Packer, NP  omeprazole (PRILOSEC) 40 MG capsule TAKE 1 CAPSULE BY MOUTH TWICE DAILY Patient taking differently: TAKE 1 CAPSULE BY MOUTH at bedtime 04/09/18   Ronnie Doss M, DO  polyethylene glycol powder (GLYCOLAX/MIRALAX) powder Mix 1  capful in 8 ounces of water and drink daily for constipation. Patient taking differently: Take 17 g by mouth daily as needed for mild constipation or moderate constipation. Mix 1 capful in 8 ounces of water and drink daily for constipation. 05/22/18   [name], DO  potassium chloride (K-DUR) 10 MEQ tablet Take 1 tablet (10 mEq total) by mouth daily. 10/07/17   Julianne Rice, MD  sertraline (ZOLOFT) 100 MG tablet Take 1 tablet (100 mg total) by mouth daily. 09/21/17   Timmothy Euler, MD  traZODone (DESYREL) 150 MG tablet TAKE 1 TABLET BY MOUTH ONCE DAILY AT BEDTIME 08/11/18   Ronnie Doss M, DO  metoprolol tartrate (LOPRESSOR) 25 MG tablet Take 25 mg by mouth 2 (two) times daily.    12/27/11  [provider]    Family History Family History  Problem Relation Age of Onset  . Stroke Mother   . Alcohol abuse Father   . Lung cancer Unknown   . Heart disease Unknown   . Other Unknown        not real familiar with family history  . Colon cancer Neg Hx   .  Colon polyps Neg Hx     Social History Social History   Tobacco Use  . Smoking status: Current Every Day Smoker    Packs/day: 1.00    Years: 30.00    [place] years: 30.00    Types: Cigarettes    Start date: 10/09/1981  . Smokeless tobacco: Never Used  . Tobacco comment: 1/2 [place] daily 11/01/17  Substance Use Topics  . 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  . Drug use: No     Allergies   Patient has no known allergies.   Review of Systems Review of Systems  Genitourinary: Positive for decreased urine volume and dysuria.  Skin: Positive for rash.  All other systems reviewed and are negative.    Physical Exam Updated Vital Signs BP (!) 154/85 (BP Location: Right Arm)   Pulse 86   Temp (!) 97.4 F (36.3 C) (Oral)   Resp 17   Ht 6\' 2"  (1.88 m)   Wt 94.3 kg   SpO2 98%   BMI 26.71 kg/m   Physical Exam  Constitutional: He is oriented to person, place, and time. He appears well-developed and well-nourished. No distress.  HENT:  Head: Normocephalic and atraumatic.  Right Ear: Hearing normal.  Left Ear: Hearing normal.  Nose: Nose normal.  Mouth/Throat: Oropharynx is clear and moist and mucous membranes are normal.  Eyes: Pupils are equal, round, and reactive to light. Conjunctivae and EOM are normal.  Neck: Normal range of motion. Neck supple.  Cardiovascular: Regular rhythm, S1 normal and S2 normal. Exam reveals no gallop and no friction rub.  No murmur heard. Pulmonary/Chest: Effort normal and breath sounds normal. No respiratory distress. He exhibits no tenderness.  Abdominal: Soft. Normal appearance and bowel sounds are normal. There is no hepatosplenomegaly. There is no tenderness. There is no rebound, no guarding, no tenderness at McBurney's point and negative Murphy's sign. No hernia.  Genitourinary: Penis normal. Prostate is enlarged and  tender. Uncircumcised.  Musculoskeletal: Normal range of motion.  Neurological: He is alert and oriented to person, place, and time. He has normal strength. No cranial nerve deficit or sensory deficit. Coordination normal. GCS eye subscore is 4. GCS verbal subscore  is 5. GCS motor subscore is 6.  Skin: Skin is warm, dry and intact. No rash noted. No cyanosis.  5 clustered small scabs over mons pubis in pubic hair region with slight surrounding erythema, no vesicles, no induration or fluctuance  Psychiatric: He has a normal mood and affect. His speech is normal and behavior is normal. Thought content normal.  Nursing note and vitals reviewed.    ED Treatments / Results  Labs (all labs ordered are listed, but only abnormal results are displayed) Labs Reviewed  URINALYSIS, ROUTINE W REFLEX MICROSCOPIC    EKG None  Radiology No results found.  Procedures Procedures (including critical care time)  Medications Ordered in ED Medications  sulfamethoxazole-trimethoprim (BACTRIM DS,SEPTRA DS) 800-160 MG per tablet 1 tablet (has no administration in time range)  phenazopyridine (PYRIDIUM) tablet 200 mg (has no administration in time range)     Initial Impression / Assessment and Plan / ED Course  I have reviewed the triage vital signs and the nursing notes.  Pertinent labs & imaging results that were available during my care of the patient were reviewed by me and considered in my medical decision making (see chart for details).     Patient reports urinary retention.  Bladder scan reveals less than 200 mL's of urine.  Patient seen 5 days ago with similar complaints.  He has been on Cipro since then.  Urine culture from previous visit reviewed, no growth.  Patient continues to feel like he cannot pass his urine, however, he clearly has been urinating because his bladder is not full.  He may be having some bladder spasms, however, a rectal examination did reveal an enlarged, tender and  boggy prostate.  Patient will require treatment for prostatitis.  We will change from Cipro to Bactrim.  This will also cover the possibility of folliculitis noted on exam.  Cannot rule out herpes type skin infection, however, lesions are entirely scabbed and crusted over, no possibility of obtaining herpes culture.    When seen 5 days ago, it was recommended that he follow-up with urology.  This was not done..  Once again recommended to call and follow-up with urology.  Final Clinical Impressions(s) / ED Diagnoses   Final diagnoses:  Acute prostatitis  Folliculitis    ED Discharge Orders    None       Orpah Greek, MD 08/11/18 2346

## 2018-08-11 NOTE — ED Notes (Signed)
Pt c/o having urinary retention all day;

## 2018-08-12 ENCOUNTER — Telehealth: Payer: Self-pay | Admitting: Family Medicine

## 2018-08-12 DIAGNOSIS — N410 Acute prostatitis: Secondary | ICD-10-CM | POA: Diagnosis not present

## 2018-08-12 NOTE — ED Notes (Signed)
Pt states at discharge he was able to urinate without difficulty

## 2018-08-13 ENCOUNTER — Encounter: Payer: Self-pay | Admitting: Family Medicine

## 2018-08-13 ENCOUNTER — Ambulatory Visit (INDEPENDENT_AMBULATORY_CARE_PROVIDER_SITE_OTHER): Payer: Medicare HMO | Admitting: Family Medicine

## 2018-08-13 VITALS — BP 139/74 | HR 84 | Temp 97.0°F | Ht 74.0 in | Wt 203.0 lb

## 2018-08-13 DIAGNOSIS — R3989 Other symptoms and signs involving the genitourinary system: Secondary | ICD-10-CM

## 2018-08-13 DIAGNOSIS — R300 Dysuria: Secondary | ICD-10-CM | POA: Diagnosis not present

## 2018-08-13 DIAGNOSIS — R05 Cough: Secondary | ICD-10-CM

## 2018-08-13 DIAGNOSIS — R059 Cough, unspecified: Secondary | ICD-10-CM

## 2018-08-13 MED ORDER — ALBUTEROL SULFATE HFA 108 (90 BASE) MCG/ACT IN AERS: 2.0000 | INHALATION_SPRAY | Freq: Four times a day (QID) | RESPIRATORY_TRACT | 2 refills | Status: DC | PRN

## 2018-08-13 MED ORDER — LIDOCAINE 5 % EX OINT: 1.0000 "application " | TOPICAL_OINTMENT | Freq: Four times a day (QID) | CUTANEOUS | 0 refills | Status: DC | PRN

## 2018-08-13 NOTE — Progress Notes (Signed)
Subjective: CC: Hematuria PCP: [name], DO HAL:PFXTK D Doolin is a 52 y.o. male presenting to clinic today for:  1. Hematuria/ dysuria Patient is here for emergency department follow-up.  He was seen on 10/29 and again on 11/03 for dysuria.  He was diagnosed and treated for acute prostatitis with Cipro.  During both visits, it was recommended that he follow-up with his urologist as he has had history of urinary retention, requiring Foley catheter in the past.  He had bladder ultrasounds during both visits and neither revealed urinary retention.  His renal function panel from 08/06/2018 demonstrated stable kidney function, he has known CKD.  No leukocytosis or anemia noted.  His urinalysis showed greater than 500 glucose, 100 protein and small hemoglobin within the sample but urine culture grew no bacteria.  He follows up today and notes that he continues to have some discomfort with urination but urine output has improved.  He "peed all day yesterday".  He continues to see some blood in urine but it seems to be lightening up over the last 24 hours.  He is almost done with the Cipro and has not yet started the Bactrim and Flomax which were sent during the last emergency department visit.  He was unaware that these were ready for pickup.  He continues to have pain in the groin area and states that he has sores in that area.  He does not recall seeing vesicles.  He denies being sexually active.  No history of herpes.   ROS: Per HPI  No Known Allergies Past Medical History:  Diagnosis Date  . Anxiety   . Asthma   . COPD (chronic obstructive pulmonary disease) ([place])   . Coronary artery disease    s/p BMS to Ramus 10/2010;  Cath 01/22/12 prox 30-40% LAD, LCx ramus w/ hazy 70-80% in-stent restenosis, EF 60% treated medically  . Coronary artery disease    s/p BMS to Ramus 10/2010;  Cath 01/22/12 prox 30-40% LAD, LCx ramus w/ hazy 70-80% in-stent restenosis, EF 60% treated medically   .  Depression   . Diabetes mellitus    Type 2  . Gastroparesis 05/2013  . GERD (gastroesophageal reflux disease)   . HTN (hypertension)   . Hyperlipidemia   . Hyperlipidemia   . Major depression, chronic 08/13/2012  . Melanoma ([place]) 2007   surgery at [facility], Followed by [facility]  . Myocardial infarction ([place]) 2012  . Obesity   . Tobacco abuse   . Tubular adenoma   . Urinary retention     Current Outpatient Medications:  .  albuterol (PROVENTIL HFA;VENTOLIN HFA) 108 (90 Base) MCG/ACT inhaler, Inhale 2 puffs into the lungs every 6 (six) hours as needed for wheezing., Disp: 1 Inhaler, Rfl: 2 .  albuterol (PROVENTIL) (2.5 MG/3ML) 0.083% nebulizer solution, Take 3 mLs (2.5 mg total) by nebulization every 6 (six) hours as needed for wheezing or shortness of breath., Disp: 75 mL, Rfl: 3 .  aspirin EC 81 MG tablet, Take 81 mg by mouth daily., Disp: , Rfl:  .  atorvastatin (LIPITOR) 80 MG tablet, Take 1 tablet (80 mg total) by mouth daily., Disp: 90 tablet, Rfl: 1 .  busPIRone (BUSPAR) 7.5 MG tablet, TAKE 1 TABLET BY MOUTH TWICE DAILY, Disp: 60 tablet, Rfl: 1 .  ciprofloxacin (CIPRO) 500 MG tablet, Take 1 tablet (500 mg total) by mouth 2 (two) times daily., Disp: 14 tablet, Rfl: 0 .  fenofibrate (TRICOR) 145 MG tablet, Take 1 tablet (145 mg total)  by mouth daily., Disp: 90 tablet, Rfl: 3 .  furosemide (LASIX) 20 MG tablet, Take 1 tablet (20 mg total) by mouth daily., Disp: 10 tablet, Rfl: 0 .  gabapentin (NEURONTIN) 800 MG tablet, Take 1 tablet (800 mg total) by mouth 3 (three) times daily., Disp: 90 tablet, Rfl: 11 .  ibuprofen (ADVIL,MOTRIN) 800 MG tablet, Take 1 tablet (800 mg total) by mouth every 6 (six) hours as needed for moderate pain., Disp: 20 tablet, Rfl: 0 .  insulin aspart (NOVOLOG FLEXPEN) 100 UNIT/ML FlexPen, Inject 10 Units into the skin 3 (three) times daily with meals. Inject up to 10 units prior to meal per sliding scale., Disp: 15 mL, Rfl: 2 .  Insulin Glargine (LANTUS SOLOSTAR)  100 UNIT/ML Solostar Pen, Inject 40 Units into the skin daily. (Patient taking differently: Inject 45-48 Units into the skin daily. ), Disp: 5 pen, Rfl: PRN .  Linagliptin-Metformin HCl (JENTADUETO) 2.02-999 MG TABS, Take 1 tablet by mouth 2 (two) times daily with a meal., Disp: 180 tablet, Rfl: 3 .  lisinopril (PRINIVIL,ZESTRIL) 5 MG tablet, Take 1 tablet (5 mg total) by mouth at bedtime., Disp: 90 tablet, Rfl: 1 .  loratadine (CLARITIN) 10 MG tablet, Take 10 mg by mouth daily as needed for allergies., Disp: , Rfl:  .  nitroGLYCERIN (NITROSTAT) 0.4 MG SL tablet, Place 1 tablet (0.4 mg total) under the tongue every 5 (five) minutes as needed. For chest pain., Disp: 25 tablet, Rfl: 3 .  omeprazole (PRILOSEC) 40 MG capsule, TAKE 1 CAPSULE BY MOUTH TWICE DAILY (Patient taking differently: TAKE 1 CAPSULE BY MOUTH at bedtime), Disp: 180 capsule, Rfl: 1 .  polyethylene glycol powder (GLYCOLAX/MIRALAX) powder, Mix 1 capful in 8 ounces of water and drink daily for constipation. (Patient taking differently: Take 17 g by mouth daily as needed for mild constipation or moderate constipation. Mix 1 capful in 8 ounces of water and drink daily for constipation.), Disp: 250 g, Rfl: 1 .  potassium chloride (K-DUR) 10 MEQ tablet, Take 1 tablet (10 mEq total) by mouth daily., Disp: 10 tablet, Rfl: 0 .  sertraline (ZOLOFT) 100 MG tablet, Take 1 tablet (100 mg total) by mouth daily., Disp: 90 tablet, Rfl: 3 .  sulfamethoxazole-trimethoprim (BACTRIM DS,SEPTRA DS) 800-160 MG tablet, Take 1 tablet by mouth 2 (two) times daily., Disp: 28 tablet, Rfl: 0 .  tamsulosin (FLOMAX) 0.4 MG CAPS capsule, Take 1 capsule (0.4 mg total) by mouth daily after breakfast., Disp: 30 capsule, Rfl: 0 .  traZODone (DESYREL) 150 MG tablet, TAKE 1 TABLET BY MOUTH ONCE DAILY AT BEDTIME, Disp: 90 tablet, Rfl: 1 Social History   Socioeconomic History  . Marital status: Divorced    Spouse name: rayann Rikard  . Number of children: 0  . Years of  education: Not on file  . Highest education level: Not on file  Occupational History  . Occupation: disabled  Social Needs  . Financial resource strain: Not on file  . Food insecurity:    Worry: Not on file    Inability: Not on file  . Transportation needs:    Medical: Not on file    Non-medical: Not on file  Tobacco Use  . Smoking status: Current Every Day Smoker    Packs/day: 1.00    Years: 30.00    [place] years: 30.00    Types: Cigarettes    Start date: 10/09/1981  . Smokeless tobacco: Never Used  . Tobacco comment: 1/2 [place] daily 11/01/17  Substance and Sexual Activity  .  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  . Drug use: No  . Sexual activity: Yes  Lifestyle  . Physical activity:    Days per week: Not on file    Minutes per session: Not on file  . Stress: Not on file  Relationships  . Social connections:    Talks on phone: Not on file    Gets together: Not on file    Attends religious service: Not on file    Active member of club or organization: Not on file    Attends meetings of clubs or organizations: Not on file    Relationship status: Not on file  . Intimate partner violence:    Fear of current or ex partner: Not on file    Emotionally abused: Not on file    Physically abused: Not on file    Forced sexual activity: Not on file  Other Topics Concern  . Not on file  Social History Narrative   Lives w/ wife   Family History  Problem Relation Age of Onset  . Stroke Mother   . Alcohol abuse Father   . Lung cancer Unknown   . Heart disease Unknown   . Other Unknown        not real familiar with family history  . Colon cancer Neg Hx   . Colon polyps Neg Hx     Objective: Office vital signs reviewed. BP 139/74   Pulse 84   Temp (!) 97 F (36.1 C) (Oral)   Ht 6\' 2"  (1.88 m)   Wt 203 lb (92.1 kg)   BMI 26.06 kg/m   Physical  Examination:  General: Awake, alert, chronically ill appearing. No acute distress Skin: 4 healing lesions noted along the lower abdomen/superior mons.  There is mild surrounding erythema and scab formation.  No palpable induration or fluctuance.  They are tender to palpation.  Assessment/ Plan: 52 y.o. male   1. Genital sore I question if these were herpetic lesions that are now healing.  I have prescribed him topical lidocaine to apply to the affected areas every 6 hours if needed.  At this time, they do not appear to be infected with a secondary bacterial infection but the Septra would cover for skin flora if needed.  I encouraged him to pick up the Septra and Flomax and follow-up with his urologist as recommended.  2. Dysuria I reviewed both his emergency department notes, labs and cultures.  He is currently being treated with antibiotics.  Patient needs to pick up the Septra and Flomax.  I reviewed his urine culture which was unremarkable for bacterial growth.  I recommended that he follow-up with his urologist as soon as possible to have direct visualization of the bladder since he is having blood in urine, though this does seem to be improving some.  I advised him not to pick up the ibuprofen given renal disease.  Instead, may use Tylenol if needed. - Basic Metabolic Panel - CBC  3. Cough Chronic. Needs refills on his albuterol. - albuterol (PROVENTIL HFA;VENTOLIN HFA) 108 (90 Base) MCG/ACT inhaler; Inhale 2 puffs into the lungs every 6 (six) hours as needed for wheezing.  Dispense: 1 Inhaler; Refill: 2   Orders Placed This Encounter  Procedures  . Basic Metabolic Panel  . CBC   Meds ordered this encounter  Medications  .  lidocaine (XYLOCAINE) 5 % ointment    Sig: Apply 1 application topically 4 (four) times daily as needed. For pain    Dispense:  35.44 g    Refill:  0  . albuterol (PROVENTIL HFA;VENTOLIN HFA) 108 (90 Base) MCG/ACT inhaler    Sig: Inhale 2 puffs into the lungs  every 6 (six) hours as needed for wheezing.    Dispense:  1 Inhaler    Refill:  [place], [place] [phone]

## 2018-08-13 NOTE — Patient Instructions (Signed)
The emergency doctor sent in prescriptions for tamsulosin and an antibiotic called Septra.  Please pick these medications up and start them today.  I am also sending in a topical medication to applied to the area on your skin that is hurting.  Call your urologist today to schedule an appointment.  We discussed that your urine showed some blood but no evidence of infection.  I am worried that something is going on in your bladder.   Hematuria, Adult Hematuria is blood in your urine. It can be caused by a bladder infection, kidney infection, prostate infection, kidney stone, or cancer of your urinary tract. Infections can usually be treated with medicine, and a kidney stone usually will pass through your urine. If neither of these is the cause of your hematuria, further workup to find out the reason may be needed. It is very important that you tell your health care provider about any blood you see in your urine, even if the blood stops without treatment or happens without causing pain. Blood in your urine that happens and then stops and then happens again can be a symptom of a very serious condition. Also, pain is not a symptom in the initial stages of many urinary cancers. Follow these instructions at home:  Drink lots of fluid, 3-4 quarts a day. If you have been diagnosed with an infection, cranberry juice is especially recommended, in addition to large amounts of water.  Avoid caffeine, tea, and carbonated beverages because they tend to irritate the bladder.  Avoid alcohol because it may irritate the prostate.  Take all medicines as directed by your health care provider.  If you were prescribed an antibiotic medicine, finish it all even if you start to feel better.  If you have been diagnosed with a kidney stone, follow your health care provider's instructions regarding straining your urine to catch the stone.  Empty your bladder often. Avoid holding urine for long periods of time.  After a  bowel movement, women should cleanse front to back. Use each tissue only once.  Empty your bladder before and after sexual intercourse if you are a male. Contact a health care provider if:  You develop back pain.  You have a fever.  You have a feeling of sickness in your stomach (nausea) or vomiting.  Your symptoms are not better in 3 days. Return sooner if you are getting worse. Get help right away if:  You develop severe vomiting and are unable to keep the medicine down.  You develop severe back or abdominal pain despite taking your medicines.  You begin passing a large amount of blood or clots in your urine.  You feel extremely weak or faint, or you pass out. This information is not intended to replace advice given to you by your health care provider. Make sure you discuss any questions you have with your health care provider. Document Released: 09/25/2005 Document Revised: 03/02/2016 Document Reviewed: 05/26/2013 Elsevier Interactive Patient Education  2017 [employer].

## 2018-08-14 LAB — CBC
Hematocrit: 40.9 % (ref 37.5–51.0)
Hemoglobin: 13.5 g/dL (ref 13.0–17.7)
MCH: 31.3 pg (ref 26.6–33.0)
MCHC: 33 g/dL (ref 31.5–35.7)
MCV: 95 fL (ref 79–97)
PLATELETS: 400 10*3/uL (ref 150–450)
RBC: 4.31 x10E6/uL (ref 4.14–5.80)
RDW: 12.7 % (ref 12.3–15.4)
WBC: 7.9 10*3/uL (ref 3.4–10.8)

## 2018-08-14 LAB — BASIC METABOLIC PANEL
BUN/Creatinine Ratio: 8 — ABNORMAL LOW (ref 9–20)
BUN: 13 mg/dL (ref 6–24)
CO2: 25 mmol/L (ref 20–29)
CREATININE: 1.62 mg/dL — AB (ref 0.76–1.27)
Calcium: 9.6 mg/dL (ref 8.7–10.2)
Chloride: 96 mmol/L (ref 96–106)
GFR calc Af Amer: 55 mL/min/{1.73_m2} — ABNORMAL LOW (ref >59)
GFR, EST NON AFRICAN AMERICAN: 48 mL/min/{1.73_m2} — AB (ref >59)
Glucose: 291 mg/dL — ABNORMAL HIGH (ref 65–99)
Potassium: 4.4 mmol/L (ref 3.5–5.2)
SODIUM: 135 mmol/L (ref 134–144)

## 2018-08-22 ENCOUNTER — Encounter: Payer: Self-pay | Admitting: Family Medicine

## 2018-08-22 ENCOUNTER — Encounter: Payer: Self-pay | Admitting: *Deleted

## 2018-08-22 ENCOUNTER — Ambulatory Visit (INDEPENDENT_AMBULATORY_CARE_PROVIDER_SITE_OTHER): Payer: Medicare HMO | Admitting: *Deleted

## 2018-08-22 ENCOUNTER — Ambulatory Visit (INDEPENDENT_AMBULATORY_CARE_PROVIDER_SITE_OTHER): Payer: Medicare HMO | Admitting: Family Medicine

## 2018-08-22 ENCOUNTER — Ambulatory Visit: Payer: Medicare HMO | Admitting: Family Medicine

## 2018-08-22 VITALS — BP 132/67 | HR 89 | Temp 98.7°F | Ht 72.5 in | Wt 201.0 lb

## 2018-08-22 VITALS — BP 132/67 | HR 89 | Ht 72.5 in | Wt 201.0 lb

## 2018-08-22 DIAGNOSIS — L84 Corns and callosities: Secondary | ICD-10-CM

## 2018-08-22 DIAGNOSIS — E1142 Type 2 diabetes mellitus with diabetic polyneuropathy: Secondary | ICD-10-CM | POA: Diagnosis not present

## 2018-08-22 DIAGNOSIS — R7989 Other specified abnormal findings of blood chemistry: Secondary | ICD-10-CM

## 2018-08-22 DIAGNOSIS — Z794 Long term (current) use of insulin: Secondary | ICD-10-CM | POA: Diagnosis not present

## 2018-08-22 DIAGNOSIS — Z0000 Encounter for general adult medical examination without abnormal findings: Secondary | ICD-10-CM | POA: Diagnosis not present

## 2018-08-22 LAB — BAYER DCA HB A1C WAIVED: HB A1C: 8.3 % — AB (ref <7.0)

## 2018-08-22 NOTE — Patient Instructions (Signed)
Stop Lisinopril and come back for blood work in 2 weeks to recheck your kidney function.  Do not take any NSAID medications (ibuprofen, aleve, etc).  Make sure you are drinking plenty of water.

## 2018-08-22 NOTE — Patient Instructions (Signed)
  Dustin Cruz , Thank you for taking time to come for your Medicare Wellness Visit. I appreciate your ongoing commitment to your health goals. Please review the following plan we discussed and let me know if I can assist you in the future.   These are the goals we discussed: Goals    . DIET - INCREASE WATER INTAKE    . Exercise 150 minutes per week (moderate activity)     Walk for 30 minutes a day    . Have 3 meals a day       This is a list of the screening recommended for you and due dates:  Health Maintenance  Topic Date Due  . Eye exam for diabetics  09/13/2016  . Complete foot exam   04/26/2018  . Tetanus Vaccine  05/23/2019*  . Hemoglobin A1C  11/22/2018  . Colon Cancer Screening  04/26/2023  . Flu Shot  Completed  . Pneumococcal vaccine  Completed  . HIV Screening  Addressed  *Topic was postponed. The date shown is not the original due date.   Look over Advanced Directives and bring a signed, notarized copy to our office.   Call the [facility] department to ask about Medicare Extra Help

## 2018-08-22 NOTE — Progress Notes (Signed)
Subjective:   Dustin Cruz is a 52 y.o. male who presents for a subsequent Medicare Annual Wellness Visit. He also has a f/u with his PCP, Dr Lajuana Ripple, at 9:30 this morning.  Patient Care Team: [name], DO as PCP - General (Family Medicine) Danie Binder, MD (Gastroenterology) Gala Romney Cristopher Estimable, MD as Consulting Physician (Gastroenterology)   Hospitalizations, surgeries, and ER visits in previous 12 months ED visit 11/3 and 10/29 for acute prostatitis and an additional ED visit on 06/17/18.  01/07/18 [facility] colonoscopy and endoscopy 04/24/18 Admission for planned repeat colonoscopy due to polyps  Review of Systems    Patient reports that his overall health is worse compared to last year.  Cardiac Risk Factors include: advanced age (>37men, >91 women);diabetes mellitus;dyslipidemia;hypertension;male gender;sedentary lifestyle;smoking/ tobacco exposure  Genitourinary: Continues to have some difficulty with urination. Difficult to start stream and scant once it does. He does have some dysuria as well. No improvement with flomax. Still on bactrim for prostatitis  Neuro: neuropathy/numbness in both feet  Psych: Some sadness/depression over the past two weeks. He had an aunt that he was very close to pass away in December a few years back. With the anniversary of her passing and the Holidays coming up he is feeling down. He says that it does have some impact on his daily life but that he is still doing the things that he needs to do. 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 He did state that they mostly call when they need something.   GI: GERD. Diarrhea prior to colonoscopy. Colon polyps with ongoing f/u. Small bowel biopsy recommended.  Other systems negative    Current Medications (verified) Outpatient Encounter Medications as of 08/22/2018  Medication Sig  . sulfamethoxazole-trimethoprim (BACTRIM DS,SEPTRA DS)  800-160 MG tablet Take 1 tablet by mouth 2 (two) times daily.  [name] albuterol (PROVENTIL HFA;VENTOLIN HFA) 108 (90 Base) MCG/ACT inhaler Inhale 2 puffs into the lungs every 6 (six) hours as needed for wheezing.  [name] albuterol (PROVENTIL) (2.5 MG/3ML) 0.083% nebulizer solution Take 3 mLs (2.5 mg total) by nebulization every 6 (six) hours as needed for wheezing or shortness of breath.  [name] aspirin EC 81 MG tablet Take 81 mg by mouth daily.  [name] atorvastatin (LIPITOR) 80 MG tablet Take 1 tablet (80 mg total) by mouth daily.  . busPIRone (BUSPAR) 7.5 MG tablet TAKE 1 TABLET BY MOUTH TWICE DAILY  . fenofibrate (TRICOR) 145 MG tablet Take 1 tablet (145 mg total) by mouth daily.  . furosemide (LASIX) 20 MG tablet Take 1 tablet (20 mg total) by mouth daily.  [name] gabapentin (NEURONTIN) 800 MG tablet Take 1 tablet (800 mg total) by mouth 3 (three) times daily.  [name] ibuprofen (ADVIL,MOTRIN) 800 MG tablet Take 1 tablet (800 mg total) by mouth every 6 (six) hours as needed for moderate pain.  [name] insulin aspart (NOVOLOG FLEXPEN) 100 UNIT/ML FlexPen Inject 10 Units into the skin 3 (three) times daily with meals. Inject up to 10 units prior to meal per sliding scale.  . Insulin Glargine (LANTUS SOLOSTAR) 100 UNIT/ML Solostar Pen Inject 40 Units into the skin daily. (Patient taking differently: Inject 45-48 Units into the skin daily. )  . lidocaine (XYLOCAINE) 5 % ointment Apply 1 application topically 4 (four) times daily as needed. For pain  . Linagliptin-Metformin HCl (JENTADUETO) 2.02-999 MG TABS Take 1 tablet by mouth 2 (two) times daily with a meal.  .  lisinopril (PRINIVIL,ZESTRIL) 5 MG tablet Take 1 tablet (5 mg total) by mouth at bedtime.  [name] loratadine (CLARITIN) 10 MG tablet Take 10 mg by mouth daily as needed for allergies.  . nitroGLYCERIN (NITROSTAT) 0.4 MG SL tablet Place 1 tablet (0.4 mg total) under the tongue every 5 (five) minutes as needed. For chest pain.  [name] omeprazole (PRILOSEC) 40 MG capsule TAKE 1 CAPSULE BY  MOUTH TWICE DAILY (Patient taking differently: TAKE 1 CAPSULE BY MOUTH at bedtime)  . polyethylene glycol powder (GLYCOLAX/MIRALAX) powder Mix 1 capful in 8 ounces of water and drink daily for constipation. (Patient taking differently: Take 17 g by mouth daily as needed for mild constipation or moderate constipation. Mix 1 capful in 8 ounces of water and drink daily for constipation.)  . potassium chloride (K-DUR) 10 MEQ tablet Take 1 tablet (10 mEq total) by mouth daily.  . sertraline (ZOLOFT) 100 MG tablet Take 1 tablet (100 mg total) by mouth daily.  . tamsulosin (FLOMAX) 0.4 MG CAPS capsule Take 1 capsule (0.4 mg total) by mouth daily after breakfast.  . traZODone (DESYREL) 150 MG tablet TAKE 1 TABLET BY MOUTH ONCE DAILY AT BEDTIME  . [DISCONTINUED] ciprofloxacin (CIPRO) 500 MG tablet Take 1 tablet (500 mg total) by mouth 2 (two) times daily.  . [DISCONTINUED] metoprolol tartrate (LOPRESSOR) 25 MG tablet Take 25 mg by mouth 2 (two) times daily.     No facility-administered encounter medications on file as of 08/22/2018.     Allergies (verified) Patient has no known allergies.   History: Past Medical History:  Diagnosis Date  . Anxiety   . Asthma   . COPD (chronic obstructive pulmonary disease) ([place])   . Coronary artery disease    s/p BMS to Ramus 10/2010;  Cath 01/22/12 prox 30-40% LAD, LCx ramus w/ hazy 70-80% in-stent restenosis, EF 60% treated medically  . Coronary artery disease    s/p BMS to Ramus 10/2010;  Cath 01/22/12 prox 30-40% LAD, LCx ramus w/ hazy 70-80% in-stent restenosis, EF 60% treated medically   . Depression   . Diabetes mellitus    Type 2  . Gastroparesis 05/2013  . GERD (gastroesophageal reflux disease)   . HTN (hypertension)   . Hyperlipidemia   . Hyperlipidemia   . Major depression, chronic 08/13/2012  . Melanoma ([place]) 2007   surgery at [facility], Followed by [facility]  . Myocardial infarction ([place]) 2012  . Obesity   . Tobacco abuse   . Tubular adenoma   .  Urinary retention    Past Surgical History:  Procedure Laterality Date  . BIOPSY  01/07/2018   Procedure: BIOPSY;  Surgeon: Daneil Dolin, MD;  [facility];  Service: Endoscopy;;  duodenal and gastric biopsy  . BIOPSY  04/25/2018   Procedure: BIOPSY;  Surgeon: Daneil Dolin, MD;  Location: [facility];  Service: Endoscopy;;  ascending and descending and sigmoid biopsies  . CARDIAC CATHETERIZATION     with stent  . COLONOSCOPY N/A 01/27/2013   JOI:NOMVEH and colonic polyps. Tubular adenomas, poor bowel prep, one-year follow-up surveillance colonoscopy recommended  . COLONOSCOPY WITH PROPOFOL N/A 04/25/2018   Procedure: COLONOSCOPY WITH PROPOFOL;  Surgeon: Daneil Dolin, MD;  Location: [facility];  Service: Endoscopy;  Laterality: N/A;  11:00am - pt to be prepped INPT and labs to be done as well  . CORONARY ANGIOPLASTY WITH STENT PLACEMENT    . ESOPHAGOGASTRODUODENOSCOPY  04/24/2012   [facility]-mild erosive reflux esophagitis,dilated w/63F Venia Minks, small HH, minimal  chronic gastric/bulbar erosions(No H pylori)  . ESOPHAGOGASTRODUODENOSCOPY (EGD) WITH PROPOFOL N/A 01/07/2018   Dr. Gala Romney: Erythematous mucosa in the stomach, retained gastric contents, incomplete exam.  Biopsied showed mild chronic gastritis but no H. pylori.  Duodenal biopsy was negative for celiac disease.  [name] FLEXIBLE SIGMOIDOSCOPY N/A 01/07/2018   Dr. Gala Romney: Incomplete colonoscopy due to inadequate bowel prep  . LEFT HEART CATHETERIZATION WITH CORONARY ANGIOGRAM N/A 01/22/2012   Procedure: LEFT HEART CATHETERIZATION WITH CORONARY ANGIOGRAM;  Surgeon: Jolaine Artist, MD;  Location: [facility] CATH LAB;  Service: Cardiovascular;  Laterality: N/A;  . melanoma surgery  2007   [place], removed lymph nodes under arm as well,Left abd   Family History  Problem Relation Age of Onset  . Stroke Mother   . Alcohol abuse Father   . Lung cancer Unknown   . Heart disease Unknown   . Other Unknown        not real familiar with family  history  . Colon cancer Neg Hx   . Colon polyps Neg Hx    Social History   Socioeconomic History  . Marital status: Divorced    Spouse name: rayann Dyar  . Number of children: 0  . Years of education: Not on file  . Highest education level: Not on file  Occupational History  . Occupation: disabled  Social Needs  . Financial resource strain: Somewhat hard  . Food insecurity:    Worry: Sometimes true    Inability: Never true  . Transportation needs:    Medical: No    Non-medical: Not on file  Tobacco Use  . Smoking status: Current Every Day Smoker    Packs/day: 1.00    Years: 31.00    [place] years: 31.00    Types: Cigarettes    Start date: 10/09/1981  . Smokeless tobacco: Never Used  . Tobacco comment: 1/2 [place] daily 11/01/17  Substance and Sexual Activity  . 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  . Drug use: No  . Sexual activity: Yes  Lifestyle  . Physical activity:    Days per week: 7 days    Minutes per session: 30 min  . Stress: Rather much  Relationships  . Social connections:    Talks on phone: More than three times a week    Gets together: More than three times a week    Attends religious service: Never    Active member of club or organization: No    Attends meetings of clubs or organizations: Never    Relationship status: Divorced  Other Topics Concern  . Not on file  Social History Narrative   Lives at home in an apartment alone. He is divorced and does not have any children. He has two brothers but is estranged from one. He only talks with the other one infrequently. He does have contact with his ex wife and they have a good relationship. They help each other out.      Clinical Intake:  Pre-visit preparation completed: No  Pain : No/denies pain Pain Score: 0-No pain(numbness in both feet) Pain Type: Neuropathic pain Pain  Location: Foot Pain Orientation: Left, Right Pain Descriptors / Indicators: Numbness     Nutritional Status: BMI 25 -29 Overweight(Eats one or two meals a day. States that he does not have a good appetite. He does gets some of his food  from [address]. He does fix quick meals at home. Drinks some water but not much. Does drink caffeine free, diet soda. ) Diabetes: Yes CBG done?: No Did pt. bring in CBG monitor from home?: No  How often do you need to have someone help you when you read instructions, pamphlets, or other written materials from your doctor or pharmacy?: 5 - Always What is the last grade level you completed in school?: 12th  Interpreter Needed?: No  Information entered by :: [name], RN  He has meals and gets food from the food pantry at [facility] in [place].   Activities of Daily Living In your present state of health, do you have any difficulty performing the following activities: 08/22/2018 04/24/2018  Hearing? N -  Vision? N -  Comment Needs eye exam. Went to [facility] but they didn't take his insurance. -  Difficulty concentrating or making decisions? N -  Walking or climbing stairs? N -  Dressing or bathing? N -  Doing errands, shopping? N Y  Conservation officer, nature and eating ? N -  Using the Toilet? N -  In the past six months, have you accidently leaked urine? N -  Do you have problems with loss of bowel control? N -  Managing your Medications? N -  Managing your Finances? N -  Housekeeping or managing your Housekeeping? N -  Some recent data might be hidden     Exercise Current Exercise Habits: Home exercise routine, Type of exercise: walking, Time (Minutes): 30, Frequency (Times/Week): 7, Weekly Exercise (Minutes/Week): 210, Intensity: Mild, Exercise limited by: neurologic condition(s)   Depression Screen PHQ 2/9 Scores 08/22/2018 08/13/2018 05/22/2018 03/19/2018 02/18/2018 09/21/2017 08/28/2017  PHQ - 2 Score 5 0 0 0 0 1 0  PHQ- 9 Score 14 0 - - - - -      Fall Risk Fall Risk  08/22/2018 08/13/2018 03/19/2018 02/18/2018 09/21/2017  Falls in the past year? 1 0 No No No  Comment No steps into apartment. Can see at night when he gets up because of street light.  - - - -  Number falls in past yr: 1 - - - -  Injury with Fall? 0 - - - -  Risk for fall due to : History of fall(s);Medication side effect;Other (Comment) - - - -  Risk for fall due to: Comment neuropathy - - - -  Follow up Falls prevention discussed - - - -     Objective:    Today's Vitals   08/22/18 0841  BP: 132/67  Pulse: 89  Weight: 201 lb (91.2 kg)  Height: 6' 0.5" (1.842 m)  PainSc: 0-No pain   Body mass index is 26.89 kg/m.  Advanced Directives 08/22/2018 08/06/2018 06/17/2018 04/25/2018 04/24/2018 01/07/2018 12/31/2017  Does Patient Have a Medical Advance Directive? No No No No No No No  Would patient like information on creating a medical advance directive? Yes (MAU/Ambulatory/Procedural Areas - Information given) - - No - Patient declined No - Patient declined No - Patient declined No - Patient declined  Pre-existing out of facility DNR order (yellow form or pink MOST form) - - - - - - -  Some encounter information is confidential and restricted. Go to Review Flowsheets activity to see all data.   Patient will discuss advanced directives with his ex wife. Plans that she will be his power of attorney.   Hearing/Vision  No hearing or vision deficits noted during visit.  Cognitive Function: MMSE - Mini Mental  State Exam 08/08/2017  Not completed: (No Data)  Orientation to time 4  Orientation to Place 4  Registration 3  Attention/ Calculation 0  Attention/Calculation-comments not attempted  Recall 2  Language- name 2 objects 2  Language- repeat 0  Language- follow 3 step command 3  Language- read & follow direction 1  Write a sentence 0  Copy design 0  Total score 19     6CIT Screen 08/22/2018  What Year? 0 points  What month? 0 points  What time? 0 points   Count back from 20 0 points  Months in reverse 4 points  Repeat phrase 0 points  Total Score 4   Normal Cognitive Function Screening: Yes    Immunizations and Health Maintenance Immunization History  Administered Date(s) Administered  . Influenza Split 07/05/2012  . Influenza,inj,Quad PF,6+ Mos 09/24/2013, 07/28/2014, 07/09/2015, 07/22/2016, 08/03/2017, 07/23/2018  . Pneumococcal Polysaccharide-23 07/05/2012   Health Maintenance Due  Topic Date Due  . OPHTHALMOLOGY EXAM  09/13/2016  . FOOT EXAM  04/26/2018   Health Maintenance  Topic Date Due  . OPHTHALMOLOGY EXAM  09/13/2016  . FOOT EXAM  04/26/2018  . TETANUS/TDAP  05/23/2019 (Originally 04/04/2017)  . HEMOGLOBIN A1C  11/22/2018  . COLONOSCOPY  04/26/2023  . INFLUENZA VACCINE  Completed  . PNEUMOCOCCAL POLYSACCHARIDE VACCINE AGE 91-64 HIGH RISK  Completed  . HIV Screening  Addressed        Assessment:   This is a routine wellness examination for Dustin Cruz.    Plan:    Goals    . DIET - INCREASE WATER INTAKE    . Exercise 150 minutes per week (moderate activity)     Walk for 30 minutes a day    . Have 3 meals a day        Health Maintenance Recommendations: Diabetic Foot Exam Diabetic Eye Exam  Additional Screening Recommendations: Lung: Low Dose CT Chest recommended if Age 74-80 years, 30 [place]-year currently smoking OR have quit w/in 15years. Patient does qualify. Hepatitis C Screening recommended: no  Keep f/u with [name], DO and any other specialty appointments you may have Diabetic foot exam with PCP today. Would like form for diabetic shoes.  Schedule diabetic eye exam and have them send a copy to our office Continue current medications Move carefully and change positions slowly to avoid falls. Use a cane if necessary.  Aim for at least 150 minutes of moderate activity a week.  Stay connected with friends and family Recommend [facility] at the [facility] to help with  the loss of his Aunt  I have personally reviewed and noted the following in the patient's chart:   . Medical and social history . Use of alcohol, tobacco or illicit drugs  . Current medications and supplements . Functional ability and status . Nutritional status . Physical activity . Advanced directives . List of other physicians . Hospitalizations, surgeries, and ER visits in previous 12 months . Vitals . Screenings to include cognitive, depression, and falls . Referrals and appointments  In addition, I have reviewed and discussed with patient certain preventive protocols, quality metrics, and best practice recommendations. A written personalized care plan for preventive services as well as general preventive health recommendations were provided to patient.     [name], RN   08/22/2018

## 2018-08-22 NOTE — Progress Notes (Signed)
Subjective: CC:DM2 HPI: Dustin Cruz is a 52 y.o. male presenting to clinic today for:  1. 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 High at home: 200s; Low at home: 83 (he reports having been symptomatic at 98, feeling shaky).  He feels like he has been watching his carbohydrates.  He reports using 50 units of Lantus every night. Novolog (5 units with meals) he states that he lowered this because he was having hypoglycemic episodes with the 10 units.  Takes lisinopril 5 mg as directed.  Side effects: none.  He would like to get diabetic shoes.  Last eye exam: he thinks <1 year; he has not yet scheduled an appointment for an eye exam.  He has an appointment with my eye doctor, which his insurance accepts.   Last foot exam: 04/2018 Last A1c: 02/2018 was >10 Nephropathy screen indicated?: on ACE-I now. Last flu, zoster and/or pneumovax: UTD  No dizziness, LOC, polyuria, polydipsia, unintended weight loss/gain, foot ulcerations, shortness of breath or chest pain.  He reports chronic LE numbness/tingling in extremities.  Denies any history of foot ulcers.   Past Medical History:  Diagnosis Date  . Anxiety   . Asthma   . COPD (chronic obstructive pulmonary disease) ([place])   . Coronary artery disease    s/p BMS to [facility] 10/2010;  Cath 01/22/12 prox 30-40% LAD, LCx ramus w/ hazy 70-80% in-stent restenosis, EF 60% treated medically  . Coronary artery disease    s/p BMS to [facility] 10/2010;  Cath 01/22/12 prox 30-40% LAD, LCx ramus w/ hazy 70-80% in-stent restenosis, EF 60% treated medically   . Depression   . Diabetes mellitus    Type 2  . Gastroparesis 05/2013  . GERD (gastroesophageal reflux disease)   . HTN (hypertension)   . Hyperlipidemia   . Hyperlipidemia   . Major depression, chronic 08/13/2012  . Melanoma ([place]) 2007   surgery at [facility], Followed by [facility]  . Myocardial infarction ([place]) 2012  . Obesity   . Tobacco abuse   . Tubular  adenoma   . Urinary retention    No Known Allergies  Current Outpatient Medications:  .  albuterol (PROVENTIL HFA;VENTOLIN HFA) 108 (90 Base) MCG/ACT inhaler, Inhale 2 puffs into the lungs every 6 (six) hours as needed for wheezing., Disp: 1 Inhaler, Rfl: 2 .  albuterol (PROVENTIL) (2.5 MG/3ML) 0.083% nebulizer solution, Take 3 mLs (2.5 mg total) by nebulization every 6 (six) hours as needed for wheezing or shortness of breath., Disp: 75 mL, Rfl: 3 .  aspirin EC 81 MG tablet, Take 81 mg by mouth daily., Disp: , Rfl:  .  atorvastatin (LIPITOR) 80 MG tablet, Take 1 tablet (80 mg total) by mouth daily., Disp: 90 tablet, Rfl: 1 .  busPIRone (BUSPAR) 7.5 MG tablet, TAKE 1 TABLET BY MOUTH TWICE DAILY, Disp: 60 tablet, Rfl: 1 .  fenofibrate (TRICOR) 145 MG tablet, Take 1 tablet (145 mg total) by mouth daily., Disp: 90 tablet, Rfl: 3 .  furosemide (LASIX) 20 MG tablet, Take 1 tablet (20 mg total) by mouth daily., Disp: 10 tablet, Rfl: 0 .  gabapentin (NEURONTIN) 800 MG tablet, Take 1 tablet (800 mg total) by mouth 3 (three) times daily., Disp: 90 tablet, Rfl: 11 .  ibuprofen (ADVIL,MOTRIN) 800 MG tablet, Take 1 tablet (800 mg total) by mouth every 6 (six) hours as needed for moderate pain., Disp: 20 tablet, Rfl: 0 .  insulin aspart (NOVOLOG FLEXPEN) 100 UNIT/ML FlexPen,  Inject 10 Units into the skin 3 (three) times daily with meals. Inject up to 10 units prior to meal per sliding scale., Disp: 15 mL, Rfl: 2 .  Insulin Glargine (LANTUS SOLOSTAR) 100 UNIT/ML Solostar Pen, Inject 40 Units into the skin daily. (Patient taking differently: Inject 45-48 Units into the skin daily. ), Disp: 5 pen, Rfl: PRN .  lidocaine (XYLOCAINE) 5 % ointment, Apply 1 application topically 4 (four) times daily as needed. For pain, Disp: 35.44 g, Rfl: 0 .  Linagliptin-Metformin HCl (JENTADUETO) 2.02-999 MG TABS, Take 1 tablet by mouth 2 (two) times daily with a meal., Disp: 180 tablet, Rfl: 3 .  lisinopril (PRINIVIL,ZESTRIL) 5 MG  tablet, Take 1 tablet (5 mg total) by mouth at bedtime., Disp: 90 tablet, Rfl: 1 .  loratadine (CLARITIN) 10 MG tablet, Take 10 mg by mouth daily as needed for allergies., Disp: , Rfl:  .  nitroGLYCERIN (NITROSTAT) 0.4 MG SL tablet, Place 1 tablet (0.4 mg total) under the tongue every 5 (five) minutes as needed. For chest pain., Disp: 25 tablet, Rfl: 3 .  omeprazole (PRILOSEC) 40 MG capsule, TAKE 1 CAPSULE BY MOUTH TWICE DAILY (Patient taking differently: TAKE 1 CAPSULE BY MOUTH at bedtime), Disp: 180 capsule, Rfl: 1 .  polyethylene glycol powder (GLYCOLAX/MIRALAX) powder, Mix 1 capful in 8 ounces of water and drink daily for constipation. (Patient taking differently: Take 17 g by mouth daily as needed for mild constipation or moderate constipation. Mix 1 capful in 8 ounces of water and drink daily for constipation.), Disp: 250 g, Rfl: 1 .  potassium chloride (K-DUR) 10 MEQ tablet, Take 1 tablet (10 mEq total) by mouth daily., Disp: 10 tablet, Rfl: 0 .  sertraline (ZOLOFT) 100 MG tablet, Take 1 tablet (100 mg total) by mouth daily., Disp: 90 tablet, Rfl: 3 .  sulfamethoxazole-trimethoprim (BACTRIM DS,SEPTRA DS) 800-160 MG tablet, Take 1 tablet by mouth 2 (two) times daily., Disp: 28 tablet, Rfl: 0 .  tamsulosin (FLOMAX) 0.4 MG CAPS capsule, Take 1 capsule (0.4 mg total) by mouth daily after breakfast., Disp: 30 capsule, Rfl: 0 .  traZODone (DESYREL) 150 MG tablet, TAKE 1 TABLET BY MOUTH ONCE DAILY AT BEDTIME, Disp: 90 tablet, Rfl: 1 Social History   Socioeconomic History  . Marital status: Divorced    Spouse name: rayann Miracle  . Number of children: 0  . Years of education: Not on file  . Highest education level: Not on file  Occupational History  . Occupation: disabled  Social Needs  . Financial resource strain: Somewhat hard  . Food insecurity:    Worry: Sometimes true    Inability: Never true  . Transportation needs:    Medical: No    Non-medical: Not on file  Tobacco Use  . Smoking  status: Current Every Day Smoker    Packs/day: 1.00    Years: 31.00    [place] years: 31.00    Types: Cigarettes    Start date: 10/09/1981  . Smokeless tobacco: Never Used  . Tobacco comment: 1/2 [place] daily 11/01/17  Substance and Sexual Activity  . 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  . Drug use: No  . Sexual activity: Yes  Lifestyle  . Physical activity:    Days per week: 7 days    Minutes per session: 30 min  . Stress: Rather much  Relationships  .  Social connections:    Talks on phone: More than three times a week    Gets together: More than three times a week    Attends religious service: Never    Active member of club or organization: No    Attends meetings of clubs or organizations: Never    Relationship status: Divorced  . Intimate partner violence:    Fear of current or ex partner: No    Emotionally abused: No    Physically abused: No    Forced sexual activity: No  Other Topics Concern  . Not on file  Social History Narrative   Lives at home in an apartment alone. He is divorced and does not have any children. He has two brothers but is estranged from one. He only talks with the other one infrequently. He does have contact with his ex wife and they have a good relationship. They help each other out.    Family History  Problem Relation Age of Onset  . Stroke Mother   . Alcohol abuse Father   . Lung cancer Unknown   . Heart disease Unknown   . Other Unknown        not real familiar with family history  . Colon cancer Neg Hx   . Colon polyps Neg Hx     Health Maintenance: DM eye, Urine micro  Objective: Office vital signs reviewed. BP 132/67   Pulse 89   Temp 98.7 F (37.1 C) (Oral)   Ht 6' 0.5" (1.842 m)   Wt 201 lb (91.2 kg)   BMI 26.89 kg/m   Physical Examination:  General: Awake, alert, no acute distress HEENT: Moist  mucous membranes Cardio: Regular rate and rhythm.  S1, S2 heard.  No murmurs appreciated.. Pulmonary: Clear to auscultation bilaterally.  No wheezes, rhonchi rales.  Normal work of breathing on room air. Extremities: Warm, no edema.  No ulcerations.  He does have a small callus on the first MTP joint of the left foot.  There is no evidence of ulceration.  1+ pedal pulses appreciated.  Diabetic Foot Exam - Simple   Simple Foot Form Diabetic Foot exam was performed with the following findings:  Yes 08/22/2018  3:15 PM  Visual Inspection No deformities, no ulcerations, no other skin breakdown bilaterally:  Yes See comments:  Yes Sensation Testing See comments:  Yes Pulse Check See comments:  Yes Comments Mild callus formation noted at the first MTP on the left.  No ulcerations appreciated.  He has reduced monofilament testing to bilateral feet.  Vibratory sense reduced bilaterally.  Pedal pulses +1 bilaterally.  Feet are cool to touch.  No gross discoloration or evidence of infection noted.     Results for orders placed or performed in visit on 08/22/18 (from the past 24 hour(s))  Bayer DCA Hb A1c Waived     Status: Abnormal   Collection Time: 08/22/18  9:39 AM  Result Value Ref Range   HB A1C (BAYER DCA - WAIVED) 8.3 (H) <7.0 %   Narrative   Performed at:  [address] [place], [place]  [id number] Lab Director: Colletta [facility], Phone:  [phone]    Assessment/ Plan: 52 y.o. male   1. Type 2 diabetes mellitus with diabetic polyneuropathy, with long-term current use of insulin ([place]) Patient with uncontrolled type 2 diabetes.  He has previously been controlled on his regimen.  We discussed diet modification.  May need to increase insulin by 2 units and recheck.  We discussed this and he will continue to monitor blood sugars closely.  If he continues to have blood sugars into the 200s or above 140s fasting, low threshold to increase dose.  Written  prescription for diabetic shoes provided today. - Bayer DCA Hb A1c Waived  2. Foot callus As above.  No evidence of ulceration at this time.  3. Elevated serum creatinine Rise in creatinine seems to have started around the summertime.  Initially, this was thought to be secondary to acute illness but upon recheck, creatinine continues to rise.  I have asked that he hold on his lisinopril since this was also started this summer.  He will return in 2 weeks for recheck.  Push oral fluids.  Avoid NSAIDs. - Basic Metabolic Panel; Future  Orders Placed This Encounter  Procedures  . Bayer DCA Hb A1c Waived  . Basic Metabolic Panel    Standing Status:   Future    Standing Expiration Date:   08/23/2019   [name], DO [place] [phone]

## 2018-08-23 ENCOUNTER — Telehealth: Payer: Self-pay | Admitting: Gastroenterology

## 2018-08-23 ENCOUNTER — Encounter: Payer: Self-pay | Admitting: Gastroenterology

## 2018-08-23 ENCOUNTER — Ambulatory Visit: Payer: Medicare HMO | Admitting: Gastroenterology

## 2018-08-23 ENCOUNTER — Other Ambulatory Visit: Payer: Self-pay | Admitting: *Deleted

## 2018-08-23 MED ORDER — GABAPENTIN 800 MG PO TABS: 800.0000 mg | ORAL_TABLET | Freq: Three times a day (TID) | ORAL | 0 refills | Status: DC

## 2018-08-23 NOTE — Telephone Encounter (Signed)
PATIENT WAS A NO SHOW AND LETTER SENT  °

## 2018-08-24 DIAGNOSIS — L84 Corns and callosities: Secondary | ICD-10-CM | POA: Diagnosis not present

## 2018-08-24 DIAGNOSIS — Z794 Long term (current) use of insulin: Secondary | ICD-10-CM | POA: Diagnosis not present

## 2018-08-24 DIAGNOSIS — E1142 Type 2 diabetes mellitus with diabetic polyneuropathy: Secondary | ICD-10-CM | POA: Diagnosis not present

## 2018-09-03 DIAGNOSIS — L84 Corns and callosities: Secondary | ICD-10-CM | POA: Diagnosis not present

## 2018-09-03 DIAGNOSIS — E1142 Type 2 diabetes mellitus with diabetic polyneuropathy: Secondary | ICD-10-CM | POA: Diagnosis not present

## 2018-09-03 DIAGNOSIS — Z794 Long term (current) use of insulin: Secondary | ICD-10-CM | POA: Diagnosis not present

## 2018-09-10 ENCOUNTER — Telehealth: Payer: Self-pay

## 2018-09-10 NOTE — Telephone Encounter (Signed)
Insurance denied Lidocaine ointment  Formulary are lidocaine muscosal jelly and Lidocaine Prilocaine topical cream

## 2018-09-10 NOTE — Telephone Encounter (Signed)
I think his discomfort resolved per our discussion last visit.

## 2018-09-24 ENCOUNTER — Telehealth: Payer: Self-pay | Admitting: Family Medicine

## 2018-09-24 NOTE — Telephone Encounter (Signed)
Pt aware that shoe claim is in process with his insurance at [employer]. Explained that if this was sent to [place] his insurance will see duplicate claims and deny them. I also explained that I have seen this with diabetic shoes taking awhile to get approved at other suppliers as well.

## 2018-10-10 ENCOUNTER — Other Ambulatory Visit: Payer: Self-pay | Admitting: *Deleted

## 2018-10-10 MED ORDER — LINAGLIPTIN-METFORMIN HCL 2.5-1000 MG PO TABS: 1.0000 | ORAL_TABLET | Freq: Two times a day (BID) | ORAL | 0 refills | Status: DC

## 2018-11-06 ENCOUNTER — Telehealth: Payer: Self-pay | Admitting: *Deleted

## 2018-11-06 ENCOUNTER — Other Ambulatory Visit: Payer: Self-pay | Admitting: Family Medicine

## 2018-11-06 NOTE — Telephone Encounter (Signed)
TC to [employer] Pt's diabetic shoes to [facility] needs an approved referral from PCP Initiated referral, [employer] is out of network for patient. Informed [employer] that this pharmacy was the closest place for the patient to receive this service. Information faxed to [phone] ref # [id number]

## 2018-11-07 NOTE — Telephone Encounter (Signed)
OV 11/22/18

## 2018-11-11 ENCOUNTER — Encounter: Payer: Self-pay | Admitting: Family

## 2018-11-11 ENCOUNTER — Ambulatory Visit (INDEPENDENT_AMBULATORY_CARE_PROVIDER_SITE_OTHER): Payer: Medicare Other | Admitting: Family

## 2018-11-11 VITALS — BP 133/77 | HR 97 | Temp 97.5°F | Ht 72.5 in | Wt 197.4 lb

## 2018-11-11 DIAGNOSIS — Z794 Long term (current) use of insulin: Secondary | ICD-10-CM

## 2018-11-11 DIAGNOSIS — E1142 Type 2 diabetes mellitus with diabetic polyneuropathy: Secondary | ICD-10-CM

## 2018-11-11 DIAGNOSIS — E1149 Type 2 diabetes mellitus with other diabetic neurological complication: Secondary | ICD-10-CM | POA: Diagnosis not present

## 2018-11-11 NOTE — Progress Notes (Signed)
   Subjective:    Patient ID: Dustin Cruz, male    DOB: May 05, 1966, 53 y.o.   MRN: [id number]  Chief Complaint  Patient presents with  . swollen, painful feet    HPI PT presents to the office today for complaints with bilateral burning, tingling pain his feet. He has diabetic neuropathy has takes gabapentin 800 mg 3-4 times a day with mild relief.   He reports his pain is a constant 9 out 10. He states he had a script for diabetic shoes, but the pharmacy he gave to script did not do diabetic shoes. He is requesting another prescription.  His blood glucose is uncontrolled and his last A1C on 08/22/18 was 8.3. He states his blood glucose is running 200's.   Review of Systems  All other systems reviewed and are negative.      Objective:   Physical Exam Vitals signs reviewed.  Constitutional:      General: He is not in acute distress.    Appearance: He is well-developed.  Neck:     Musculoskeletal: Normal range of motion and neck supple.     Thyroid: No thyromegaly.  Cardiovascular:     Rate and Rhythm: Normal rate and regular rhythm.     Heart sounds: Normal heart sounds. No murmur.  Pulmonary:     Effort: Pulmonary effort is normal. No respiratory distress.     Breath sounds: Normal breath sounds. No wheezing.  Abdominal:     General: Bowel sounds are normal. There is no distension.     Palpations: Abdomen is soft.     Tenderness: There is no abdominal tenderness.  Musculoskeletal: Normal range of motion.        General: No tenderness.  Skin:    General: Skin is warm and dry.     Findings: No erythema or rash.  Neurological:     Mental Status: He is alert and oriented to person, place, and time.     Cranial Nerves: No cranial nerve deficit.     Deep Tendon Reflexes: Reflexes are normal and symmetric.  Psychiatric:        Behavior: Behavior normal.        Thought Content: Thought content normal.        Judgment: Judgment normal.     See diabetic foot note.   BP  133/77   Pulse 97   Temp (!) 97.5 F (36.4 C) (Oral)   Ht 6' 0.5" (1.842 m)   Wt 197 lb 6.4 oz (89.5 kg)   BMI 26.40 kg/m      Assessment & Plan:  Jayln Madeira Kaster comes in today with chief complaint of swollen, painful feet   Diagnosis and orders addressed:  1. Type 2 diabetes mellitus with diabetic polyneuropathy, with long-term current use of insulin ([place])  2. Other diabetic neurological complication associated with type 2 diabetes mellitus ([place])   RX given for Diabetic shoes Keep follow up with PCP for chronic follow up  Strict low carb and continue medications  Continue Gabapentin  Diabetic foot care discussed Evelina Dun, FNP

## 2018-11-11 NOTE — Patient Instructions (Signed)
Diabetic Neuropathy Diabetic neuropathy refers to nerve damage that is caused by diabetes (diabetes mellitus). Over time, people with diabetes can develop nerve damage throughout the body. There are several types of diabetic neuropathy:  Peripheral neuropathy. This is the most common type of diabetic neuropathy. It causes damage to nerves that carry signals between the spinal cord and other parts of the body (peripheral nerves). This usually affects nerves in the feet and legs first, and may eventually affect the hands and arms. The damage affects the ability to sense touch or temperature.  Autonomic neuropathy. This type causes damage to nerves that control involuntary functions (autonomic nerves). These nerves carry signals that control: ? Heartbeat. ? Body temperature. ? Blood pressure. ? Urination. ? Digestion. ? Sweating. ? Sexual function. ? Response to changing blood sugar (glucose) levels.  Focal neuropathy. This type of nerve damage affects one area of the body, such as an arm, a leg, or the face. The injury may involve one nerve or a small group of nerves. Focal neuropathy can be painful and unpredictable, and occurs most often in older adults with diabetes. This often develops suddenly, but usually improves over time and does not cause long-term problems.  Proximal neuropathy. This type of nerve damage affects the nerves of the thighs, hips, buttocks, or legs. It causes severe pain, weakness, and muscle death (atrophy), usually in the thigh muscles. It is more common among older men and people who have type 2 diabetes. The length of recovery time may vary. What are the causes? Peripheral, autonomic, and focal neuropathies are caused by diabetes that is not well controlled with treatment. The cause of proximal neuropathy is not known, but it may be caused by inflammation related to uncontrolled blood glucose levels. What are the signs or symptoms? Peripheral neuropathy Peripheral  neuropathy develops slowly over time. When the nerves of the feet and legs no longer work, you may experience:  Burning, stabbing, or aching pain in the legs or feet.  Pain or cramping in the legs or feet.  Loss of feeling (numbness) and inability to feel pressure or pain in the feet. This can lead to: ? Thick calluses or sores on areas of constant pressure. ? Ulcers. ? Reduced ability to feel temperature changes.  Foot deformities.  Muscle weakness.  Loss of balance or coordination. Autonomic neuropathy The symptoms of autonomic neuropathy vary depending on which nerves are affected. Symptoms may include:  Problems with digestion, such as: ? Nausea or vomiting. ? Poor appetite. ? Bloating. ? Diarrhea or constipation. ? Trouble swallowing. ? Losing weight without trying to.  Problems with the heart, blood and lungs, such as: ? Dizziness, especially when standing up. ? Fainting. ? Shortness of breath. ? Irregular heartbeat.  Bladder problems, such as: ? Trouble starting or stopping urination. ? Leaking urine. ? Trouble emptying the bladder. ? Urinary tract infections (UTIs).  Problems with other body functions, such as: ? Sweat. You may sweat too much or too little. ? Temperature. You might get hot easily. Or, you might feel cold more than usual. ? Sexual function. Men may not be able to get or maintain an erection. Women may have vaginal dryness and difficulty with arousal. Focal neuropathy Symptoms affect only one area of the body. Common symptoms include:  Numbness.  Tingling.  Burning pain.  Prickling feeling.  Very sensitive skin.  Weakness.  Inability to move (paralysis).  Muscle twitching.  Muscles getting smaller (wasting).  Poor coordination.  Double or blurred vision. Proximal   neuropathy  Sudden, severe pain in the hip, thigh, or buttocks. Pain may spread from the back into the legs (sciatica).  Pain and numbness in the arms and  legs.  Tingling.  Loss of bladder control or bowel control.  Weakness and wasting of thigh muscles.  Difficulty getting up from a seated position.  Abdominal swelling.  Unexplained weight loss. How is this diagnosed? Diagnosis usually involves reviewing your medical history and any symptoms you have. Diagnosis varies depending on the type of neuropathy your health care provider suspects. Peripheral neuropathy Your health care provider will check areas that are affected by your nervous system (neurologic exam), such as your reflexes, how you move, and what you can feel. You may have other tests, such as:  Blood tests.  Removal and examination of fluid that surrounds the spinal cord (lumbar puncture).  CT scan.  MRI.  A test to check the nerves that control muscles (electromyogram, EMG).  Tests of how quickly messages pass through your nerves (nerve conduction velocity tests).  Removal of a small piece of nerve to be examined under a microscope (biopsy). Autonomic neuropathy You may have tests, such as:  Tests to measure your blood pressure and heart rate. This may include monitoring you while you are safely secured to an exam table that moves you from a lying position to an upright position (table tilt test).  Breathing tests to check your lungs.  Tests to check how food moves through the digestive system (gastric emptying tests).  Blood, sweat, or urine tests.  Ultrasound of your bladder.  Spinal fluid tests. Focal neuropathy This condition may be diagnosed with:  A neurologic exam.  CT scan.  MRI.  EMG.  Nerve conduction velocity tests. Proximal neuropathy There is no test to diagnose this type of neuropathy. You may have tests to rule out other possible causes of this type of neuropathy. Tests may include:  X-rays of your spine and lumbar region.  Lumbar puncture.  MRI. How is this treated? The goal of treatment is to keep nerve damage from getting  worse. The most important part of treatment is keeping your blood glucose level and your A1C level within your target range by following your diabetes management plan. Over time, maintaining lower blood glucose levels helps lessen symptoms. In some cases, you may need prescription pain medicine. Follow these instructions at home:  Lifestyle   Do not use any products that contain nicotine or tobacco, such as cigarettes and e-cigarettes. If you need help quitting, ask your health care provider.  Be physically active every day. Include strength training and balance exercises.  Follow a healthy meal plan.  Work with your health care provider to manage your blood pressure. General instructions  Follow your diabetes management plan as directed. ? Check your blood glucose levels as directed by your health care provider. ? Keep your blood glucose in your target range as directed by your health care provider. ? Have your A1C level checked at least two times a year, or as often as told by your health care provider.  Take over the counter and prescription medicines only as told by your health care provider. This includes insulin and diabetes medicine.  Do not drive or use heavy machinery while taking prescription pain medicines.  Check your skin and feet every day for cuts, bruises, redness, blisters, or sores.  Keep all follow up visits as told by your health care provider. This is important. Contact a health care provider if:  by your health care provider.  ? Keep your blood glucose in your target range as directed by your health care provider.  ? Have your A1C level checked at least two times a year, or as often as told by your health care provider.  · Take over the counter and prescription medicines only as told by your health care provider. This includes insulin and diabetes medicine.  · Do not drive or use heavy machinery while taking prescription pain medicines.  · Check your skin and feet every day for cuts, bruises, redness, blisters, or sores.  · Keep all follow up visits as told by your health care provider. This is important.  Contact a health care provider if:  · You have burning, stabbing, or aching pain in your legs or feet.  · You are unable to feel pressure or pain in your feet.  · You develop problems with digestion, such as:  ? Nausea.  ? Vomiting.  ? Bloating.  ? Constipation.  ? Diarrhea.  ? Abdominal pain.  · You have difficulty with urination, such as inability:  ? To control when you urinate (incontinence).  ? To completely empty the bladder (retention).  · You have palpitations.  · You feel dizzy, weak, or faint when you  stand up.  Get help right away if:  · You cannot urinate.  · You have sudden weakness or loss of coordination.  · You have trouble speaking.  · You have pain or pressure in your chest.  · You have an irregular heart beat.  · You have sudden inability to move a part of your body.  Summary  · Diabetic neuropathy refers to nerve damage that is caused by diabetes. It can affect nerves throughout the entire body, causing numbness and pain in the arms, legs, digestive tract, heart, and other body systems.  · Keep your blood glucose level and your blood pressure in your target range, as directed by your health care provider. This can help prevent neuropathy from getting worse.  · Check your skin and feet every day for cuts, bruises, redness, blisters, or sores.  · Do not use any products that contain nicotine or tobacco, such as cigarettes and e-cigarettes. If you need help quitting, ask your health care provider.  This information is not intended to replace advice given to you by your health care provider. Make sure you discuss any questions you have with your health care provider.  Document Released: 12/04/2001 Document Revised: 11/07/2017 Document Reviewed: 10/30/2016  Elsevier Interactive Patient Education © 2019 [employer]

## 2018-11-17 ENCOUNTER — Encounter (HOSPITAL_COMMUNITY): Payer: Self-pay | Admitting: Emergency Medicine

## 2018-11-17 ENCOUNTER — Emergency Department (HOSPITAL_COMMUNITY)
Admission: EM | Admit: 2018-11-17 | Discharge: 2018-11-17 | Disposition: A | Discharge status: Home / Self Care | Payer: Medicare Other | Attending: Emergency Medicine | Admitting: Emergency Medicine

## 2018-11-17 ENCOUNTER — Other Ambulatory Visit: Payer: Self-pay

## 2018-11-17 DIAGNOSIS — I10 Essential (primary) hypertension: Secondary | ICD-10-CM | POA: Insufficient documentation

## 2018-11-17 DIAGNOSIS — I2510 Atherosclerotic heart disease of native coronary artery without angina pectoris: Secondary | ICD-10-CM | POA: Insufficient documentation

## 2018-11-17 DIAGNOSIS — J449 Chronic obstructive pulmonary disease, unspecified: Secondary | ICD-10-CM | POA: Insufficient documentation

## 2018-11-17 DIAGNOSIS — E11319 Type 2 diabetes mellitus with unspecified diabetic retinopathy without macular edema: Secondary | ICD-10-CM | POA: Diagnosis not present

## 2018-11-17 DIAGNOSIS — E1143 Type 2 diabetes mellitus with diabetic autonomic (poly)neuropathy: Secondary | ICD-10-CM | POA: Insufficient documentation

## 2018-11-17 DIAGNOSIS — Z7982 Long term (current) use of aspirin: Secondary | ICD-10-CM | POA: Diagnosis not present

## 2018-11-17 DIAGNOSIS — K0889 Other specified disorders of teeth and supporting structures: Secondary | ICD-10-CM | POA: Insufficient documentation

## 2018-11-17 DIAGNOSIS — Z79899 Other long term (current) drug therapy: Secondary | ICD-10-CM | POA: Diagnosis not present

## 2018-11-17 DIAGNOSIS — F17210 Nicotine dependence, cigarettes, uncomplicated: Secondary | ICD-10-CM | POA: Diagnosis not present

## 2018-11-17 DIAGNOSIS — Z794 Long term (current) use of insulin: Secondary | ICD-10-CM | POA: Insufficient documentation

## 2018-11-17 NOTE — Discharge Instructions (Signed)
Please follow up with a dentist.

## 2018-11-17 NOTE — ED Triage Notes (Signed)
Front tooth loose and painful   Here for eval

## 2018-11-17 NOTE — ED Provider Notes (Signed)
[facility] EMERGENCY DEPARTMENT Provider Note   CSN: [id number] Arrival date & time: 11/17/18  2035     History   Chief Complaint Chief Complaint  Patient presents with  . Dental Pain    HPI Dustin Cruz is a 53 y.o. male who presents with dental pain. PMH significant for asthma/COPD, insulin dependent DM, CAD, HTN, HLD, tobacco abuse.  Patient states that he has had a loose tooth over the bottom left side for about 1 month.  It is been gradually worsening.  Nothing makes it better or worse.  He states that he wants it pulled tonight.  He has not had any fevers or difficulty swallowing or facial swelling.  He has pulled his own teeth multiple times in the past but states that he is on a baby aspirin so he is worried about doing it himself.  He does not have a Pharmacist, community.  HPI  Past Medical History:  Diagnosis Date  . Anxiety   . Asthma   . COPD (chronic obstructive pulmonary disease) ([place])   . Coronary artery disease    s/p BMS to Ramus 10/2010;  Cath 01/22/12 prox 30-40% LAD, LCx ramus w/ hazy 70-80% in-stent restenosis, EF 60% treated medically  . Coronary artery disease    s/p BMS to Ramus 10/2010;  Cath 01/22/12 prox 30-40% LAD, LCx ramus w/ hazy 70-80% in-stent restenosis, EF 60% treated medically   . Depression   . Diabetes mellitus    Type 2  . Gastroparesis 05/2013  . GERD (gastroesophageal reflux disease)   . HTN (hypertension)   . Hyperlipidemia   . Hyperlipidemia   . Major depression, chronic 08/13/2012  . Melanoma ([place]) 2007   surgery at [facility], Followed by [facility]  . Myocardial infarction ([place]) 2012  . Obesity   . Tobacco abuse   . Tubular adenoma   . Urinary retention     Patient Active Problem List   Diagnosis Date Noted  . Microalbuminuria due to type 2 diabetes mellitus ([place]) 05/22/2018  . Hyperglycemia 04/24/2018  . Abdominal pain, epigastric 11/29/2017  . Diarrhea 11/29/2017  . History of colonic polyps 11/29/2017  . Diffuse abdominal pain  11/29/2017  . Diabetic retinopathy ([place]) 09/21/2015  . Carotid bruit 09/21/2015  . Viral gastroenteritis 06/03/2015  . Hypertriglyceridemia 05/18/2015  . Vitamin D deficiency 12/29/2013  . Nausea alone 04/24/2013  . Frequency of urination 04/14/2013  . Fever, unspecified 04/14/2013  . Hepatic steatosis 12/28/2012  . Peripheral neuropathy 11/25/2012  . Melanoma of skin, site unspecified 11/25/2012  . Hyperlipidemia   . Depression   . Hypertension associated with diabetes ([place])   . GERD (gastroesophageal reflux disease) 04/03/2012  . Tobacco abuse 03/14/2012  . Diabetes mellitus, type 2 ([place])   . 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 BIOPSY  01/07/2018   Procedure: BIOPSY;  Surgeon: Daneil Dolin, MD;  [facility];  Service: Endoscopy;;  duodenal and gastric biopsy  . BIOPSY  04/25/2018   Procedure: BIOPSY;  Surgeon: Daneil Dolin, MD;  Location: [facility];  Service: Endoscopy;;  ascending and descending and sigmoid biopsies  . CARDIAC CATHETERIZATION     with stent  . COLONOSCOPY N/A 01/27/2013   EUM:PNTIRW and colonic polyps. Tubular adenomas, poor bowel prep, one-year follow-up surveillance colonoscopy recommended  . COLONOSCOPY WITH PROPOFOL N/A 04/25/2018   Procedure: COLONOSCOPY WITH PROPOFOL;  Surgeon: Daneil Dolin, MD;  Location: [facility];  Service: Endoscopy;  Laterality: N/A;  11:00am - pt to be prepped INPT and labs to be done as well  . CORONARY ANGIOPLASTY WITH STENT PLACEMENT    . ESOPHAGOGASTRODUODENOSCOPY  04/24/2012   [place]-mild erosive reflux esophagitis,dilated w/108F Venia Minks, small HH, minimal chronic gastric/bulbar erosions(No H pylori)  . ESOPHAGOGASTRODUODENOSCOPY (EGD) WITH PROPOFOL N/A 01/07/2018   Dr. Gala Romney: Erythematous mucosa in the stomach, retained gastric contents, incomplete exam.  Biopsied showed mild chronic gastritis but no H. pylori.  Duodenal biopsy  was negative for celiac disease.  [name] FLEXIBLE SIGMOIDOSCOPY N/A 01/07/2018   Dr. Gala Romney: Incomplete colonoscopy due to inadequate bowel prep  . LEFT HEART CATHETERIZATION WITH CORONARY ANGIOGRAM N/A 01/22/2012   Procedure: LEFT HEART CATHETERIZATION WITH CORONARY ANGIOGRAM;  Surgeon: Jolaine Artist, MD;  Location[facility] C[facility] LAB;  Service: Cardiovascular;  Laterality: N/A;  . melanoma surgery  2007   [place], removed lymph nodes under arm as well,Left abd        Home Medications    Prior to Admission medications   Medication Sig Start Date End Date Taking? Authorizing Provider  albuterol (PROVENTIL HFA;VENTOLIN HFA) 108 (90 Base) MCG/ACT inhaler Inhale 2 puffs into the lungs every 6 (six) hours as needed for wheezing. 08/13/18   [name], DO  albuterol (PROVENTIL) (2.5 MG/3ML) 0.083% nebulizer solution Take 3 mLs (2.5 mg total) by nebulization every 6 (six) hours as needed for wheezing or shortness of breath. 10/15/17   Timmothy Euler, MD  aspirin EC 81 MG tablet Take 81 mg by mouth daily.    [provider]  atorvastatin (LIPITOR) 80 MG tablet Take 1 tablet (80 mg total) by mouth daily. 07/11/18   [name], DO  busPIRone (BUSPAR) 7.5 MG tablet TAKE 1 TABLET BY MOUTH TWICE DAILY 11/07/18   Ronnie Doss M, DO  fenofibrate (TRICOR) 145 MG tablet Take 1 tablet (145 mg total) by mouth daily. 04/17/18   [name], DO  furosemide (LASIX) 20 MG tablet Take 1 tablet (20 mg total) by mouth daily. 10/07/17   Julianne Rice, MD  gabapentin (NEURONTIN) 800 MG tablet TAKE 1 TABLET BY MOUTH THREE TIMES DAILY 11/07/18   Ronnie Doss M, DO  ibuprofen (ADVIL,MOTRIN) 800 MG tablet Take 1 tablet (800 mg total) by mouth every 6 (six) hours as needed for moderate pain. 08/11/18   Pollina, Gwenyth Allegra, MD  insulin aspart (NOVOLOG FLEXPEN) 100 UNIT/ML FlexPen Inject 10 Units into the skin 3 (three) times daily with meals. Inject up to 10 units prior to meal per sliding  scale. 09/21/17   Timmothy Euler, MD  Insulin Glargine (LANTUS SOLOSTAR) 100 UNIT/ML Solostar Pen Inject 40 Units into the skin daily. Patient taking differently: Inject 45-48 Units into the skin daily.  05/10/18   [name], DO  lidocaine (XYLOCAINE) 5 % ointment Apply 1 application topically 4 (four) times daily as needed. For pain 08/13/18   Ronnie Doss M, DO  linaGLIPtin-metFORMIN HCl (JENTADUETO) 2.02-999 MG TABS Take 1 tablet by mouth 2 (two) times daily with a meal. 10/10/18   Gottschalk, Ashly M, DO  lisinopril (PRINIVIL,ZESTRIL) 5 MG tablet TAKE 1 TABLET BY MOUTH AT BEDTIME 11/07/18   Gottschalk, Ashly M, DO  loratadine (CLARITIN) 10 MG tablet Take 10 mg by mouth daily as needed for allergies.    [provider]  nitroGLYCERIN (NITROSTAT) 0.4 MG SL tablet Place 1 tablet (0.4 mg total) under the tongue every 5 (five) minutes as needed. For chest pain. 08/13/12  Reece Packer, NP  omeprazole (PRILOSEC) 40 MG capsule TAKE 1 CAPSULE BY MOUTH TWICE DAILY Patient taking differently: TAKE 1 CAPSULE BY MOUTH at bedtime 04/09/18   Ronnie Doss M, DO  polyethylene glycol powder (GLYCOLAX/MIRALAX) powder Mix 1 capful in 8 ounces of water and drink daily for constipation. Patient taking differently: Take 17 g by mouth daily as needed for mild constipation or moderate constipation. Mix 1 capful in 8 ounces of water and drink daily for constipation. 05/22/18   [name], DO  potassium chloride (K-DUR) 10 MEQ tablet Take 1 tablet (10 mEq total) by mouth daily. 10/07/17   Julianne Rice, MD  sertraline (ZOLOFT) 100 MG tablet Take 1 tablet (100 mg total) by mouth daily. 09/21/17   Timmothy Euler, MD  tamsulosin (FLOMAX) 0.4 MG CAPS capsule Take 1 capsule (0.4 mg total) by mouth daily after breakfast. 08/11/18   Orpah Greek, MD  traZODone (DESYREL) 150 MG tablet TAKE 1 TABLET BY MOUTH ONCE DAILY AT BEDTIME 08/11/18   Ronnie Doss M, DO  metoprolol  tartrate (LOPRESSOR) 25 MG tablet Take 25 mg by mouth 2 (two) times daily.    12/27/11  [provider]    Family History Family History  Problem Relation Age of Onset  . Stroke Mother   . Alcohol abuse Father   . Lung cancer Other   . Heart disease Other   . Other Other        not real familiar with family history  . Colon cancer Neg Hx   . Colon polyps Neg Hx     Social History Social History   Tobacco Use  . Smoking status: Current Every Day Smoker    Packs/day: 1.00    Years: 31.00    [place] years: 31.00    Types: Cigarettes    Start date: 10/09/1981  . Smokeless tobacco: Never Used  . Tobacco comment: 1/2 [place] daily 11/01/17  Substance Use Topics  . 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  . Drug use: No     Allergies   Patient has no known allergies.   Review of Systems Review of Systems  Constitutional: Negative for fever.  HENT: Positive for dental problem.      Physical Exam Updated Vital Signs BP (!) 156/85 (BP Location: Right Arm)   Pulse 85   Temp 97.8 F (36.6 C) (Oral)   Resp 18   Ht 6\' 2"  (1.88 m)   Wt 90.7 kg   SpO2 98%   BMI 25.68 kg/m   Physical Exam Vitals signs and nursing note reviewed.  Constitutional:      General: He is not in acute distress.    Appearance: He is well-developed.     Comments: Chronically ill-appearing.  Possibly intoxicated  HENT:     Head: Normocephalic and atraumatic.     Jaw: No trismus.     Mouth/Throat:     Lips: Pink.     Dentition: Abnormal dentition (Loose bottom left incisor.  No surrounding erythema or signs of infection.). Gingival swelling and dental caries present. No dental tenderness or dental abscesses.  Eyes:     General: No scleral icterus.       Right eye: No discharge.        Left eye: No discharge.     Conjunctiva/sclera: Conjunctivae normal.  Pupils: Pupils are equal, round, and reactive to light.  Neck:     Musculoskeletal: Normal range of motion.  Cardiovascular:     Rate and Rhythm: Normal rate.  Pulmonary:     Effort: Pulmonary effort is normal. No respiratory distress.  Abdominal:     General: There is no distension.  Skin:    General: Skin is warm and dry.  Neurological:     Mental Status: He is alert and oriented to person, place, and time.  Psychiatric:        Behavior: Behavior normal.      ED Treatments / Results  Labs (all labs ordered are listed, but only abnormal results are displayed) Labs Reviewed - No data to display  EKG None  Radiology No results found.  Procedures Procedures (including critical care time)  Medications Ordered in ED Medications - No data to display   Initial Impression / Assessment and Plan / ED Course  I have reviewed the triage vital signs and the nursing notes.  Pertinent labs & imaging results that were available during my care of the patient were reviewed by me and considered in my medical decision making (see chart for details).  53 year old male presents with a spot on left incisor for 1 month.  He is requesting that I pulled his tooth tonight.  Patient is mildly hypertensive but otherwise vital signs are normal.  Advised him that we do not pull teeth.  Not see any signs of infection on exam therefore will forego any antibiotics.  He was given dental resources.  Final Clinical Impressions(s) / ED Diagnoses   Final diagnoses:  Loosening of tooth    ED Discharge Orders    None       Recardo Evangelist, PA-C 11/17/18 2113    Fredia Sorrow, MD 11/26/18 [phone]

## 2018-11-22 ENCOUNTER — Encounter: Payer: Self-pay | Admitting: Family Medicine

## 2018-11-22 ENCOUNTER — Ambulatory Visit (INDEPENDENT_AMBULATORY_CARE_PROVIDER_SITE_OTHER): Payer: Medicare Other | Admitting: Family Medicine

## 2018-11-22 VITALS — BP 129/68 | HR 89 | Temp 98.4°F | Ht 74.0 in | Wt 201.0 lb

## 2018-11-22 DIAGNOSIS — E1159 Type 2 diabetes mellitus with other circulatory complications: Secondary | ICD-10-CM | POA: Diagnosis not present

## 2018-11-22 DIAGNOSIS — K089 Disorder of teeth and supporting structures, unspecified: Secondary | ICD-10-CM | POA: Diagnosis not present

## 2018-11-22 DIAGNOSIS — I10 Essential (primary) hypertension: Secondary | ICD-10-CM

## 2018-11-22 DIAGNOSIS — Z794 Long term (current) use of insulin: Secondary | ICD-10-CM | POA: Diagnosis not present

## 2018-11-22 DIAGNOSIS — R809 Proteinuria, unspecified: Secondary | ICD-10-CM | POA: Diagnosis not present

## 2018-11-22 DIAGNOSIS — E1142 Type 2 diabetes mellitus with diabetic polyneuropathy: Secondary | ICD-10-CM | POA: Diagnosis not present

## 2018-11-22 DIAGNOSIS — E1129 Type 2 diabetes mellitus with other diabetic kidney complication: Secondary | ICD-10-CM | POA: Diagnosis not present

## 2018-11-22 DIAGNOSIS — Z720 Tobacco use: Secondary | ICD-10-CM | POA: Diagnosis not present

## 2018-11-22 DIAGNOSIS — I152 Hypertension secondary to endocrine disorders: Secondary | ICD-10-CM

## 2018-11-22 LAB — BAYER DCA HB A1C WAIVED: HB A1C (BAYER DCA - WAIVED): 9.5 % — ABNORMAL HIGH (ref <7.0)

## 2018-11-22 MED ORDER — BUSPIRONE HCL 7.5 MG PO TABS: 7.5000 mg | ORAL_TABLET | Freq: Two times a day (BID) | ORAL | 2 refills | Status: DC

## 2018-11-22 MED ORDER — INSULIN GLARGINE 100 UNIT/ML SOLOSTAR PEN: 65.0000 [IU] | PEN_INJECTOR | Freq: Every day | SUBCUTANEOUS | 3 refills | Status: DC

## 2018-11-22 NOTE — Progress Notes (Signed)
Subjective: CC:DM2 HPI: Dustin Cruz is a 53 y.o. male presenting to clinic today for:  1. Type 2 Diabetes w/ HTN, HLD, microalbuminuria and CKD:  Patient here for follow-up on diabetes. High at home: 200s; Low at home: 170 (he is eating peanut butter sandwiches before bed).  He is not watching his diet.  His GF was recently hospitalized.  He reports using 65 units of Lantus every night. Novolog (5 units with meals).  He is taking the Janetdueto BID.  Takes lisinopril 5 mg as directed.  Side effects: none.  He got his diabetic shoes and notes improvement in foot symptoms.  Continues to take Gabapentin 800mg  TID.  He continues to smoke 1/2 ppd.  No hemoptysis.  Occasional SOB.  He has to use an inhaler every once in a while.  Last eye exam: >1 year with my eye doctor.  Has not yet scheduled Last foot exam: 04/2018 Last A1c:  Lab Results  Component Value Date   HGBA1C 8.3 (H) 08/22/2018   Nephropathy screen indicated?: on ACE-I. Last flu, zoster and/or pneumovax: UTD  No dizziness, LOC, polyuria, polydipsia, unintended weight loss/gain, foot ulcerations, shortness of breath or chest pain.  He reports chronic LE numbness/tingling in extremities.   2.  Poor dentition Patient reports that previously, he was able to secure a referral to dentistry in [facility] for dental extraction.  They had to "gas him".  He is wondering if we can help him get this again.  He is unable to afford dental extractions otherwise.   Past Medical History:  Diagnosis Date  . Anxiety   . Asthma   . COPD (chronic obstructive pulmonary disease) ([place])   . Coronary artery disease    s/p BMS to Ramus 10/2010;  Cath 01/22/12 prox 30-40% LAD, LCx ramus w/ hazy 70-80% in-stent restenosis, EF 60% treated medically  . Coronary artery disease    s/p BMS to Ramus 10/2010;  Cath 01/22/12 prox 30-40% LAD, LCx ramus w/ hazy 70-80% in-stent restenosis, EF 60% treated medically   . Depression   . Diabetes mellitus    Type 2  .  Gastroparesis 05/2013  . GERD (gastroesophageal reflux disease)   . HTN (hypertension)   . Hyperlipidemia   . Hyperlipidemia   . Major depression, chronic 08/13/2012  . Melanoma ([place]) 2007   surgery at [facility], Followed by [facility]  . Myocardial infarction ([place]) 2012  . Obesity   . Tobacco abuse   . Tubular adenoma   . Urinary retention    No Known Allergies  Current Outpatient Medications:  .  albuterol (PROVENTIL HFA;VENTOLIN HFA) 108 (90 Base) MCG/ACT inhaler, Inhale 2 puffs into the lungs every 6 (six) hours as needed for wheezing., Disp: 1 Inhaler, Rfl: 2 .  albuterol (PROVENTIL) (2.5 MG/3ML) 0.083% nebulizer solution, Take 3 mLs (2.5 mg total) by nebulization every 6 (six) hours as needed for wheezing or shortness of breath., Disp: 75 mL, Rfl: 3 .  aspirin EC 81 MG tablet, Take 81 mg by mouth daily., Disp: , Rfl:  .  atorvastatin (LIPITOR) 80 MG tablet, Take 1 tablet (80 mg total) by mouth daily., Disp: 90 tablet, Rfl: 1 .  busPIRone (BUSPAR) 7.5 MG tablet, TAKE 1 TABLET BY MOUTH TWICE DAILY, Disp: 60 tablet, Rfl: 0 .  fenofibrate (TRICOR) 145 MG tablet, Take 1 tablet (145 mg total) by mouth daily., Disp: 90 tablet, Rfl: 3 .  furosemide (LASIX) 20 MG tablet, Take 1 tablet (20 mg total) by mouth daily.,  Disp: 10 tablet, Rfl: 0 .  gabapentin (NEURONTIN) 800 MG tablet, TAKE 1 TABLET BY MOUTH THREE TIMES DAILY, Disp: 270 tablet, Rfl: 0 .  ibuprofen (ADVIL,MOTRIN) 800 MG tablet, Take 1 tablet (800 mg total) by mouth every 6 (six) hours as needed for moderate pain., Disp: 20 tablet, Rfl: 0 .  insulin aspart (NOVOLOG FLEXPEN) 100 UNIT/ML FlexPen, Inject 10 Units into the skin 3 (three) times daily with meals. Inject up to 10 units prior to meal per sliding scale., Disp: 15 mL, Rfl: 2 .  Insulin Glargine (LANTUS SOLOSTAR) 100 UNIT/ML Solostar Pen, Inject 40 Units into the skin daily. (Patient taking differently: Inject 45-48 Units into the skin daily. ), Disp: 5 pen, Rfl: PRN .  lidocaine  (XYLOCAINE) 5 % ointment, Apply 1 application topically 4 (four) times daily as needed. For pain, Disp: 35.44 g, Rfl: 0 .  linaGLIPtin-metFORMIN HCl (JENTADUETO) 2.02-999 MG TABS, Take 1 tablet by mouth 2 (two) times daily with a meal., Disp: 180 tablet, Rfl: 0 .  lisinopril (PRINIVIL,ZESTRIL) 5 MG tablet, TAKE 1 TABLET BY MOUTH AT BEDTIME, Disp: 90 tablet, Rfl: 0 .  loratadine (CLARITIN) 10 MG tablet, Take 10 mg by mouth daily as needed for allergies., Disp: , Rfl:  .  nitroGLYCERIN (NITROSTAT) 0.4 MG SL tablet, Place 1 tablet (0.4 mg total) under the tongue every 5 (five) minutes as needed. For chest pain., Disp: 25 tablet, Rfl: 3 .  omeprazole (PRILOSEC) 40 MG capsule, TAKE 1 CAPSULE BY MOUTH TWICE DAILY (Patient taking differently: TAKE 1 CAPSULE BY MOUTH at bedtime), Disp: 180 capsule, Rfl: 1 .  polyethylene glycol powder (GLYCOLAX/MIRALAX) powder, Mix 1 capful in 8 ounces of water and drink daily for constipation. (Patient taking differently: Take 17 g by mouth daily as needed for mild constipation or moderate constipation. Mix 1 capful in 8 ounces of water and drink daily for constipation.), Disp: 250 g, Rfl: 1 .  potassium chloride (K-DUR) 10 MEQ tablet, Take 1 tablet (10 mEq total) by mouth daily., Disp: 10 tablet, Rfl: 0 .  sertraline (ZOLOFT) 100 MG tablet, Take 1 tablet (100 mg total) by mouth daily., Disp: 90 tablet, Rfl: 3 .  tamsulosin (FLOMAX) 0.4 MG CAPS capsule, Take 1 capsule (0.4 mg total) by mouth daily after breakfast., Disp: 30 capsule, Rfl: 0 .  traZODone (DESYREL) 150 MG tablet, TAKE 1 TABLET BY MOUTH ONCE DAILY AT BEDTIME, Disp: 90 tablet, Rfl: 1 Social History   Socioeconomic History  . Marital status: Divorced    Spouse name: rayann Narula  . Number of children: 0  . Years of education: Not on file  . Highest education level: Not on file  Occupational History  . Occupation: disabled  Social Needs  . Financial resource strain: Somewhat hard  . Food insecurity:     Worry: Sometimes true    Inability: Never true  . Transportation needs:    Medical: No    Non-medical: Not on file  Tobacco Use  . Smoking status: Current Every Day Smoker    Packs/day: 1.00    Years: 31.00    [place] years: 31.00    Types: Cigarettes    Start date: 10/09/1981  . Smokeless tobacco: Never Used  . Tobacco comment: 1/2 [place] daily 11/01/17  Substance and Sexual Activity  . 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  [name] Drug  use: No  . Sexual activity: Yes  Lifestyle  . Physical activity:    Days per week: 7 days    Minutes per session: 30 min  . Stress: Rather much  Relationships  . Social connections:    Talks on phone: More than three times a week    Gets together: More than three times a week    Attends religious service: Never    Active member of club or organization: No    Attends meetings of clubs or organizations: Never    Relationship status: Divorced  . Intimate partner violence:    Fear of current or ex partner: No    Emotionally abused: No    Physically abused: No    Forced sexual activity: No  Other Topics Concern  . Not on file  Social History Narrative   Lives at home in an apartment alone. He is divorced and does not have any children. He has two brothers but is estranged from one. He only talks with the other one infrequently. He does have contact with his ex wife and they have a good relationship. They help each other out.    Family History  Problem Relation Age of Onset  . Stroke Mother   . Alcohol abuse Father   . Lung cancer Other   . Heart disease Other   . Other Other        not real familiar with family history  . Colon cancer Neg Hx   . Colon polyps Neg Hx     Health Maintenance: DM eye, Urine micro  Objective: Office vital signs reviewed. BP 129/68   Pulse 89   Temp 98.4 F (36.9 C) (Oral)   Ht 6\' 2"   (1.88 m)   Wt 201 lb (91.2 kg)   BMI 25.81 kg/m   Physical Examination:  General: Awake, alert, no acute distress HEENT: sclera injected bilaterally. Moist mucous membranes; poor dentition/ caries noted/ several teeth missing Cardio: Regular rate and rhythm.  S1, S2 heard.  No murmurs appreciated.. Pulmonary: Mild expiratory wheezes noted.  Normal work of breathing on room air. Psych: mood stable, speech normal.  Depression screen [facility] 2/9 11/22/2018 11/11/2018 08/22/2018  Decreased Interest 0 0 3  Down, Depressed, Hopeless 0 0 2  PHQ - 2 Score 0 0 5  Altered sleeping 0 - 3  Tired, decreased energy 0 - 3  Change in appetite 0 - 3  Feeling bad or failure about yourself  0 - 0  Trouble concentrating 0 - 0  Moving slowly or fidgety/restless 0 - 0  Suicidal thoughts 0 - 0  PHQ-9 Score 0 - 14  Difficult doing work/chores - - Somewhat difficult  Some recent data might be hidden   GAD 7 : Generalized Anxiety Score 11/22/2018  Nervous, Anxious, on Edge 0  Control/stop worrying 0  Worry too much - different things 0  Trouble relaxing 0  Restless 0  Easily annoyed or irritable 0  Afraid - awful might happen 0  Total GAD 7 Score 0  Anxiety Difficulty Not difficult at all    Assessment/ Plan: 53 y.o. male   1. Type 2 diabetes mellitus with diabetic polyneuropathy, with long-term current use of insulin ([place]) Uncontrolled with A1c worsened from last check.  A1c today was 9.5.  I have changed his Lantus directions and advised him to increase by 1 unit every 2 days for fasting blood sugars greater than 150.  He will follow-up in 3 months, sooner if needed.  Additionally, he was advised to reduce carbohydrate intake as he has not been modifying his diet as directed previously - Bayer DCA Hb A1c Waived - Basic Metabolic Panel  2. Microalbuminuria due to type 2 diabetes mellitus ([place]) On ACE-I - Basic Metabolic Panel  3. Hypertension associated with diabetes ([place]) Controlled - Basic  Metabolic Panel  4. Tobacco use Actively working on cessation.  Down to 1/2 [place]/day.  5. Poor dentition - Referral to Chronic Care Management Services    Orders Placed This Encounter  Procedures  . Bayer DCA Hb A1c Waived  . Basic Metabolic Panel   [name], DO [place] [phone]

## 2018-11-22 NOTE — Patient Instructions (Signed)
I am rechecking your kidney function today.  If you continue to have impairment, we are going to have to change some of your medications around, specifically the Gabapentin and Metformin.  I will call you Monday with instructions.  Your sugar is horribly uncontrolled right now.  Your A1c was 9.5.  This increases your risk of strokes and heart attacks.  This smoking makes this even worse.    Continue increasing your Lantus by 1 unit every 2 days for fasting blood sugar higher than 150.  See me again in 3 months for recheck.  Get your diabetic eye exam done.

## 2018-11-23 LAB — BASIC METABOLIC PANEL
BUN / CREAT RATIO: 10 (ref 9–20)
BUN: 16 mg/dL (ref 6–24)
CO2: 24 mmol/L (ref 20–29)
Calcium: 9.5 mg/dL (ref 8.7–10.2)
Chloride: 97 mmol/L (ref 96–106)
Creatinine, Ser: 1.54 mg/dL — ABNORMAL HIGH (ref 0.76–1.27)
GFR calc Af Amer: 59 mL/min/{1.73_m2} — ABNORMAL LOW (ref >59)
GFR, EST NON AFRICAN AMERICAN: 51 mL/min/{1.73_m2} — AB (ref >59)
Glucose: 289 mg/dL — ABNORMAL HIGH (ref 65–99)
Potassium: 5.8 mmol/L — ABNORMAL HIGH (ref 3.5–5.2)
Sodium: 135 mmol/L (ref 134–144)

## 2018-11-25 ENCOUNTER — Other Ambulatory Visit: Payer: Self-pay | Admitting: Family Medicine

## 2018-11-25 DIAGNOSIS — E1122 Type 2 diabetes mellitus with diabetic chronic kidney disease: Secondary | ICD-10-CM

## 2018-11-25 DIAGNOSIS — Z794 Long term (current) use of insulin: Principal | ICD-10-CM

## 2018-11-25 DIAGNOSIS — N183 Chronic kidney disease, stage 3 (moderate): Principal | ICD-10-CM

## 2018-11-25 MED ORDER — GABAPENTIN 600 MG PO TABS: 600.0000 mg | ORAL_TABLET | Freq: Three times a day (TID) | ORAL | 1 refills | Status: DC

## 2018-11-26 ENCOUNTER — Ambulatory Visit: Payer: Self-pay | Admitting: *Deleted

## 2018-11-26 ENCOUNTER — Ambulatory Visit: Payer: Self-pay | Admitting: Licensed Clinical Social Worker

## 2018-11-26 DIAGNOSIS — I152 Hypertension secondary to endocrine disorders: Secondary | ICD-10-CM

## 2018-11-26 DIAGNOSIS — E1159 Type 2 diabetes mellitus with other circulatory complications: Secondary | ICD-10-CM

## 2018-11-26 DIAGNOSIS — E785 Hyperlipidemia, unspecified: Secondary | ICD-10-CM

## 2018-11-26 DIAGNOSIS — I10 Essential (primary) hypertension: Secondary | ICD-10-CM

## 2018-11-26 DIAGNOSIS — Z794 Long term (current) use of insulin: Secondary | ICD-10-CM

## 2018-11-26 DIAGNOSIS — R7989 Other specified abnormal findings of blood chemistry: Secondary | ICD-10-CM

## 2018-11-26 DIAGNOSIS — E1142 Type 2 diabetes mellitus with diabetic polyneuropathy: Secondary | ICD-10-CM

## 2018-11-26 DIAGNOSIS — K089 Disorder of teeth and supporting structures, unspecified: Secondary | ICD-10-CM

## 2018-11-26 DIAGNOSIS — F329 Major depressive disorder, single episode, unspecified: Secondary | ICD-10-CM

## 2018-11-26 DIAGNOSIS — F32A Depression, unspecified: Secondary | ICD-10-CM

## 2018-11-26 NOTE — Chronic Care Management (AMB) (Signed)
Care Management   Note  11/26/2018 Name: Dustin Cruz MRN: [id number] DOB: Sep 05, 1966  Subjective: "I need help with my mouth"  Objective:  Lab Results  Component Value Date   HGBA1C 9.5 (H) 11/22/2018   HGBA1C 8.3 (H) 08/22/2018   HGBA1C 7.3 (H) 05/22/2018   Lab Results  Component Value Date   MICROALBUR negative 05/17/2015   LDLCALC 52 01/15/2017   CREATININE 1.54 (H) 11/22/2018   Assessment: Dustin Cruz is a 53 year old male primary care patient of Dr Lajuana Cruz who was referred for CCM services due to hypertension, uncontrolled diabetes, COPD, chronic kidney disease, and depression  The primary purpose of this call by our LCSW was to explain and offer CCM services and provide Dustin Cruz with information on community resources that he may find useful. I was consulted because Dustin Cruz continues to complain of dental pain and loosening of teeth.  Goals Addressed            This Visit's Progress   . "I need help with this pain in my mouth" (pt-stated)       Current Barriers:  [name] Knowledge Deficits related to acute and long term management of dental health including pain management . Film/video editor. - LCSW is actively working with patient in an effort to provide support and resources related to financial constraints preventing proper dental care  Dustin. Cruz was evaluated at the ED on 11/17/18. The following was noted in the physical exam:   "Dentition: Abnormal dentition(Loose bottom left incisor. No surrounding erythema or signs of infection.).Gingival swellingand dental cariespresent. No dental tendernessor dental abscesses."  The following was noted in the ED discharge plan:  "53 year old male presents with a spot on left incisor for 1 month. He is requesting that I pulled his tooth tonight. Patient is mildly hypertensive but otherwise vital signs are normal. Advised him that we do not pull teeth. Not see any signs of infection on exam therefore will forego any  antibiotics. He was given dental resources."  Patient has not followed up with a dentist and his primary concern at this time is pain management until he is able to see a dentist.  Nurse Case Manager Clinical Goal(s):  [name] Over the next 30 days, patient will verbalize basic understanding of dental disease process and self health management plan as evidenced by participation in development of plan of care and verbalization of understanding of long term plan for management of dental health . Over the next 30 days, patient will demonstrate improved health management independence as evidenced by outreach to primary care provider office or dentist office for management of dental care needs and pain management  Interventions:  . Evaluation of current treatment plan related to dental health concerns and patient's adherence to plan as established by provider.  Reviewed medications with patient and discussed safe use of over the counter analgesics -discussed his OTC pain control options. He is unable to take NSAIDS due to stage 3 CKD and I advised him to continue to avoid those.  Recommended 2 extra strength Tylenol which he reported had not helped previously. I also recommended an OTC topical pain reliever like Anbesol or Orajel. I also advised him to read the directions carefully and cautioned him against overuse.   [name] with LCSW regarding financial barriers preventing appropriate dental care   Patient Self Care Activities:  . Self administers medications as prescribed . Performs IADL's independently . Performs ADL's independently   Plan:  . Patient will  adhere to prescribed pain management regimen . Patient will follow up with dental care provider - We were able to provide him with the name of a local dentist, Dr Kermit Balo on [address] in [place] [phone]) and she is listed as taking Medicaid insurance. He stated that he would call her this evening to schedule an appointment for  tomorrow. .  Patient will reach back out to the CCM Team if he is unable to see Dr Kermit Balo tomorrow.   [name] RNCM will follow up with patient by phone over the next 24-48 hours to assess patient progress, pain management, and ongoing plans for dental care   *initial goal documentation     . Improved knowledge of DM disease management needs       Current Barriers:  [name] Knowledge Deficits related to DM disease process and self health management plan . Film/video editor.   Nurse Case Manager Clinical Goal(s):  [name] Over the next 60 days, patient will verbalize basic understanding of DM disease process and self health management plan as evidenced by participation in development of long term plan for self health management of DM  Interventions:  . Evaluation of current treatment plan related to DM and patient's adherence to plan as established by provider. . Provided education to patient re: diabetes care as related to current dental health concerns   Patient Self Care Activities:  . Self administers medications as prescribed . Performs ADL's independently . Performs IADL's independently  Plan:  . Patient will work with [facility] to address knowledge deficits and long term care planning needs related to DM . RNCM will follow up with patient by phone and in person regarding chronic DM disease management needs   *initial goal documentation     . Improved knowledge of self care activities related to CKD Stage 3       Current Barriers:  [name] Knowledge Deficits related to CKD disease process and self health management plan* . Film/video editor.   .  Nurse Case Manager Clinical Goal(s):  [name] Over the next 60 days, patient will verbalize basic understanding of CKD disease process and self health management plan as evidenced by participation in establishment of long term plan of care   Interventions:  . Evaluation of current treatment plan related to CKD Stage 3 and patient's adherence to plan as  established by provider. . Reviewed medications with patient and discussed safe use of over the counter analgesics for patient with CKD Stage 3  Patient Self Care Activities:  . Self administers medications as prescribed . Performs ADL's independently . Performs IADL's independently  Plan:  . Patient will participate with RNCM in chronic care management activities as related to long term management of CKD 3 . RNCM will follow up with patient by phone over the next 48 hours and will establish routine follow up to assist with long term planning and education related to CKD . Patient will continue to work with LCSW to address financial constraints and mental health barriers that may be contributing to decreased ability to participate in self care activites related to CKD  *initial goal documentation        Follow Up Plan:  RNCM will reach out to the patient over the next 2 days to follow up on dental pain and to setup an appointment for a face to face or telephone visit within the next two weeks.   Patient stated understanding and agreement to plan.  [name], RN-BC, BSN Nurse Case Manager [facility] [phone]

## 2018-11-26 NOTE — Chronic Care Management (AMB) (Signed)
Chronic Care Management    Clinical Social Work General Note  11/26/2018 Name: Dustin Cruz MRN: [id number] DOB: 07/02/1966  Referred by: PCP, Mervin Hack for psychosocial assessment and dental needs information  Mr. Parlato was given information about Chronic Care Management services today including:  1. CCM service includes personalized support from designated clinical staff supervised by his physician, including individualized plan of care and coordination with other care providers 2. 24/7 contact phone numbers for assistance for urgent and routine care needs. 3. Service will only be billed when office clinical staff spend 20 minutes or more in a month to coordinate care. 4. Only one practitioner may furnish and bill the service in a calendar month. 5. The patient may stop CCM services at any time (effective at the end of the month) by phone call to the office staff. 6. The patient will be responsible for cost sharing (co-pay) of up to 20% of the service fee (after annual deductible is met).  Patient agreed to services and verbal consent obtained.   Review of patient status, including review of consultants reports, relevant laboratory and other test results, and collaboration with appropriate care team members and the patient's provider was performed as part of comprehensive patient evaluation and provision of chronic care management services.    Last CCM Appointment: 11/26/2018  Depression screen [facility] 2/9 11/22/2018 11/11/2018 08/22/2018  Decreased Interest 0 0 3  Down, Depressed, Hopeless 0 0 2  PHQ - 2 Score 0 0 5  Altered sleeping 0 - 3  Tired, decreased energy 0 - 3  Change in appetite 0 - 3  Feeling bad or failure about yourself  0 - 0  Trouble concentrating 0 - 0  Moving slowly or fidgety/restless 0 - 0  Suicidal thoughts 0 - 0  PHQ-9 Score 0 - 14  Difficult doing work/chores - - Somewhat difficult  Some recent data might be hidden     GAD 7 : Generalized Anxiety  Score 11/22/2018  Nervous, Anxious, on Edge 0  Control/stop worrying 0  Worry too much - different things 0  Trouble relaxing 0  Restless 0  Easily annoyed or irritable 0  Afraid - awful might happen 0  Total GAD 7 Score 0  Anxiety Difficulty Not difficult at all   Client states he has ongoing dental pain. He said he may need several teeth extracted.  Client said some of his teeth are broken. He spoke of financial challenges in getting dental work done [facility] talked with client about [facility] in [place] as a dental agency. LCSW also talked with client about [facility] in [place], [place].  Client said he has trouble sleeping due to dental pain.    Current Barriers:   . Dental pain issues . Financial Challenges  Clinical Social Work Clinical Goal(s):  [name]  Over the next 30 days, client will work with SW to address concerns related to dental care needs of client   Interventions: . LCSW referred client to RN CM for nursing needs  . Provided patient with information about CCM program services . Patient interviewed related to psychosocial needs of client.  [name] LCSW and RN CM talked with client about local dentist and gave client phone number of local dentist  Patient Self Care Activities:  . Self administers medications as prescribed . Attends all scheduled provider appointments . Performs ADL's independently  Plan:  . Client to call LCSW as needed to discuss psychosocial needs of client  . LCSW  to call client in next 2 weeks to discuss current client needs . Client to communicate with RN CM to discuss nursing needs of client . Client to attend scheduled medical appointments . Client to call local dentist to see if client can get a scheduled appointment with local dentist . Client to communicate with RN CM to discuss nursing needs of client  *initial goal documentation   Follow Up Plan: LCSW to call client in next 2 weeks to discuss current client needs.  Norva Riffle.Jaeda Bruso MSW, LCSW Licensed Clinical Social Worker [facility] Care Management [phone]

## 2018-11-26 NOTE — Patient Instructions (Addendum)
Visit Information  Goals    . "I need help with this pain in my mouth" (pt-stated)     Current Barriers:  [name] Knowledge Deficits related to acute and long term management of dental health including pain management . Film/video editor. - LCSW is actively working with patient in an effort to provide support and resources related to financial constraints preventing proper dental care  Mr. Capistran was evaluated at the ED on 11/17/18. The following was noted in the physical exam:   "Dentition: Abnormal dentition(Loose bottom left incisor. No surrounding erythema or signs of infection.).Gingival swellingand dental cariespresent. No dental tendernessor dental abscesses."  The following was noted in the ED discharge plan:  "53 year old male presents with a spot on left incisor for 1 month. He is requesting that I pulled his tooth tonight. Patient is mildly hypertensive but otherwise vital signs are normal. Advised him that we do not pull teeth. Not see any signs of infection on exam therefore will forego any antibiotics. He was given dental resources."  Patient has not followed up with a dentist and his primary concern at this time is pain management until he is able to see a dentist.  Nurse Case Manager Clinical Goal(s):  [name] Over the next 30 days, patient will verbalize basic understanding of dental disease process and self health management plan as evidenced by participation in development of plan of care and verbalization of understanding of long term plan for management of dental health . Over the next 30 days, patient will demonstrate improved health management independence as evidenced by outreach to primary care provider office or dentist office for management of dental care needs and pain management  Interventions:  . Evaluation of current treatment plan related to dental health concerns and patient's adherence to plan as established by provider.  Reviewed medications with patient and  discussed safe use of over the counter analgesics -discussed his OTC pain control options. He is unable to take NSAIDS due to stage 3 CKD and I advised him to continue to avoid those.  Recommended 2 extra strength Tylenol which he reported had not helped previously. I also recommended an OTC topical pain reliever like Anbesol or Orajel. I also advised him to read the directions carefully and cautioned him against overuse.   [name] with LCSW regarding financial barriers preventing appropriate dental care   Patient Self Care Activities:  . Self administers medications as prescribed . Performs IADL's independently . Performs ADL's independently   Plan:  . Patient will adhere to prescribed pain management regimen . Patient will follow up with dental care provider - We were able to provide him with the name of a local dentist, Dr Kermit Balo on [address] in [place] [phone]) and she is listed as taking Medicaid insurance. He stated that he would call her this evening to schedule an appointment for tomorrow. .  Patient will reach back out to the CCM Team if he is unable to see Dr Kermit Balo tomorrow.   [name] RNCM will follow up with patient by phone over the next 24-48 hours to assess patient progress, pain management, and ongoing plans for dental care   *initial goal documentation     . Improved knowledge of DM disease management needs     Current Barriers:  [name] Knowledge Deficits related to DM disease process and self health management plan . Film/video editor.   Nurse Case Manager Clinical Goal(s):  [name] Over the next 60 days, patient will verbalize basic understanding of DM  disease process and self health management plan as evidenced by participation in development of long term plan for self health management of DM  Interventions:  . Evaluation of current treatment plan related to DM and patient's adherence to plan as established by provider. . Provided education to patient re: diabetes care  as related to current dental health concerns   Patient Self Care Activities:  . Self administers medications as prescribed . Performs ADL's independently . Performs IADL's independently  Plan:  . Patient will work with [facility] to address knowledge deficits and long term care planning needs related to DM . RNCM will follow up with patient by phone and in person regarding chronic DM disease management needs   *initial goal documentation     . Improved knowledge of self care activities related to CKD Stage 3     Current Barriers:  [name] Knowledge Deficits related to CKD disease process and self health management plan* . Film/video editor.   .  Nurse Case Manager Clinical Goal(s):  [name] Over the next 60 days, patient will verbalize basic understanding of CKD disease process and self health management plan as evidenced by participation in establishment of long term plan of care   Interventions:  . Evaluation of current treatment plan related to CKD Stage 3 and patient's adherence to plan as established by provider. . Reviewed medications with patient and discussed safe use of over the counter analgesics for patient with CKD Stage 3  Patient Self Care Activities:  . Self administers medications as prescribed . Performs ADL's independently . Performs IADL's independently  Plan:  . Patient will participate with RNCM in chronic care management activities as related to long term management of CKD 3 . RNCM will follow up with patient by phone over the next 48 hours and will establish routine follow up to assist with long term planning and education related to CKD . Patient will continue to work with LCSW to address financial constraints and mental health barriers that may be contributing to decreased ability to participate in self care activites related to CKD  *initial goal documentation        Education or Materials Provided:  . Verbal instruction re: dental care and pain  management  Mr. Kichline was given information about Chronic Care Management services today including:  1. CCM service includes personalized support from designated clinical staff supervised by his physician, including individualized plan of care and coordination with other care providers 2. 24/7 contact phone numbers for assistance for urgent and routine care needs. 3. Service will only be billed when office clinical staff spend 20 minutes or more in a month to coordinate care. 4. Only one practitioner may furnish and bill the service in a calendar month. 5. The patient may stop CCM services at any time (effective at the end of the month) by phone call to the office staff. 6. The patient will be responsible for cost sharing (co-pay) of up to 20% of the service fee (after annual deductible is met).  Patient agreed to services and verbal consent obtained.   The patient verbalized understanding of instructions provided today and declined a print copy of patient instruction materials.   Telephone follow up appointment with CCM team member scheduled for: next 48 hours  SIGNATURE

## 2018-11-26 NOTE — Patient Instructions (Signed)
Licensed Clinical Social Worker Visit Information  Materials provided: No  Mr. Kowal was given information about Chronic Care Management services today including:  1. CCM service includes personalized support from designated clinical staff supervised by his physician, including individualized plan of care and coordination with other care providers 2. 24/7 contact phone numbers for assistance for urgent and routine care needs. 3. Service will only be billed when office clinical staff spend 20 minutes or more in a month to coordinate care. 4. Only one practitioner may furnish and bill the service in a calendar month. 5. The patient may stop CCM services at any time (effective at the end of the month) by phone call to the office staff. 6. The patient will be responsible for cost sharing (co-pay) of up to 20% of the service fee (after annual deductible is met).  Patient agreed to services and verbal consent obtained.   Goals We Discussed Today:  Client states he has ongoing dental pain. He said he may need several teeth extracted.  Client said some of his teeth are broken. He spoke of financial challenges in getting dental work done [facility] talked with client about [facility] in [place] as a dental agency. LCSW also talked with client about [facility] in [place], [place].  Client said he has trouble sleeping due to dental pain.    Current Barriers:   Dental pain issues  Financial Challenges  Clinical Social Work Clinical Goal(s):    Over the next 30 days, client will work with LCSW to address concerns related to dental care needs of client   Interventions:  LCSW referred client to RN CM for nursing needs   Provided patient with information about CCM program services  Patient interviewed related to psychosocial needs of client.   LCSW and RN CM talked with client about local dentist and gave client phone number of local dentist  LCSW encouraged client to  communicate with RN CM to discuss nursing needs of client  Patient Self Care Activities:   Self administers medications as prescribed  Attends all scheduled provider appointments  Performs ADL's independently  Plan:   Client to call LCSW as needed to discuss psychosocial needs of client   LCSW to call client in next 2 weeks to discuss current client needs  Client to communicate with RN CM to discuss nursing needs of client  Client to attend scheduled medical appointments  Client to call local dentist to see if client can get a scheduled appointment with local dentist  *initial goal documentation   Follow Up Plan: LCSW to call client in next 2 weeks to discuss current client needs.  The patient verbalized understanding of instructions provided today and declined a print copy of patient instruction materials.   Norva Riffle.Bairon Klemann MSW, LCSW Licensed Clinical Social Worker [facility] Care Management [phone]

## 2018-11-27 ENCOUNTER — Ambulatory Visit: Payer: Self-pay | Admitting: *Deleted

## 2018-11-27 DIAGNOSIS — N183 Chronic kidney disease, stage 3 unspecified: Secondary | ICD-10-CM

## 2018-11-27 DIAGNOSIS — E1122 Type 2 diabetes mellitus with diabetic chronic kidney disease: Secondary | ICD-10-CM

## 2018-11-27 DIAGNOSIS — K089 Disorder of teeth and supporting structures, unspecified: Secondary | ICD-10-CM

## 2018-11-27 DIAGNOSIS — E1142 Type 2 diabetes mellitus with diabetic polyneuropathy: Secondary | ICD-10-CM

## 2018-11-27 DIAGNOSIS — Z794 Long term (current) use of insulin: Secondary | ICD-10-CM

## 2018-11-27 NOTE — Chronic Care Management (AMB) (Signed)
Chronic Care Management   Telephone Follow Up Note   11/27/2018 Name: Dustin Cruz MRN: [id number] DOB: 1966-08-29  Referred by: [name], DO Reason for referral : Chronic Care Management   Subjective: "I still need to see a dentist for this mouth and tooth pain" and "I started having pain/tingling in my shoulder and arm yesterday while I was splitting wood. I was worried that it might be stroke"  Patient reports that he developed pain and tingling in his neck and right shoulder/arm while splitting wood yesterday. Although he questions if it may have been a stroke, he did not seek medical attention. He denies having any weakness, change in gait, slurred speech, cognitive impairment, nausea, chest pain or any other s/s of a stroke or MI. The pain/tingling was not present when he woke up this morning but did returned around 10:00 am. He does have increased pain with head and arm movement.  He has not used any OTC treatments.   Objective: Lab Results  Component Value Date   HGBA1C 9.5 (H) 11/22/2018   HGBA1C 8.3 (H) 08/22/2018   HGBA1C 7.3 (H) 05/22/2018   Lab Results  Component Value Date   MICROALBUR negative 05/17/2015   LDLCALC 52 01/15/2017   CREATININE 1.54 (H) 11/22/2018   BP Readings from Last 3 Encounters:  11/22/18 129/68  11/17/18 (!) 156/85  11/11/18 133/77   Active Ambulatory Problems    Diagnosis Date Noted  . Arteriosclerotic cardiovascular disease (ASCVD) 01/06/2011  . Diabetes mellitus, type 2 ([place])   . Tobacco abuse 03/14/2012  . GERD (gastroesophageal reflux disease) 04/03/2012  . Hyperlipidemia   . Depression   . Hypertension associated with diabetes ([place])   . Peripheral neuropathy 11/25/2012  . Melanoma of skin, site unspecified 11/25/2012  . Hepatic steatosis 12/28/2012  . Frequency of urination 04/14/2013  . Fever, unspecified 04/14/2013  . Nausea alone 04/24/2013  . Vitamin D deficiency 12/29/2013  . Hypertriglyceridemia 05/18/2015  .  Viral gastroenteritis 06/03/2015  . Diabetic retinopathy ([place]) 09/21/2015  . Carotid bruit 09/21/2015  . Abdominal pain, epigastric 11/29/2017  . Diarrhea 11/29/2017  . History of colonic polyps 11/29/2017  . Diffuse abdominal pain 11/29/2017  . Hyperglycemia 04/24/2018  . Microalbuminuria due to type 2 diabetes mellitus ([place]) 05/22/2018  . CKD stage 3 due to type 2 diabetes mellitus ([place]) 11/28/2018    Past Medical History:  Diagnosis Date  . Asthma   . COPD (chronic obstructive pulmonary disease) ([place])   . Coronary artery disease   . Coronary artery disease   . Diabetes mellitus   . Gastroparesis 05/2013  . HTN (hypertension)   . Major depression, chronic 08/13/2012  . Melanoma ([facility]) 2007  . Myocardial infarction ([facility]) 2012  . Obesity   . Tubular adenoma   . Urinary retention      Assessment: Dustin Cruz is a 53 year old male primary care patient of Dr Lajuana Ripple who was referred for CCM services due to multiple chronic medical conditions that include uncontrolled diabetes, hypertension, poor dentition, COPD, CKD, and depression.   Goals Addressed            This Visit's Progress   . "I need help with this pain in my mouth" (pt-stated)       Current Barriers:  [name] Knowledge Deficits related to acute and long term management of dental health including pain management . Film/video editor. - LCSW is actively working with patient in an effort to provide support and resources related to  financial constraints preventing proper dental care  Dustin. Cruz was evaluated at the ED on 11/17/18. The following was noted in the physical exam:   "Dentition: Abnormal dentition(Loose bottom left incisor. No surrounding erythema or signs of infection.).Gingival swellingand dental cariespresent. No dental tendernessor dental abscesses."  The following was noted in the ED discharge plan:  "53 year old male presents with a spot on left incisor for 1 month. He is requesting that I pulled his  tooth tonight. Patient is mildly hypertensive but otherwise vital signs are normal. Advised him that we do not pull teeth. Not see any signs of infection on exam therefore will forego any antibiotics. He was given dental resources."  Patient has not followed up with a dentist and his primary concern at this time is pain management until he is able to see a dentist.  Nurse Case Manager Clinical Goal(s):  [name] Over the next 30 days, patient will verbalize basic understanding of dental disease process and self health management plan as evidenced by participation in development of plan of care and verbalization of understanding of long term plan for management of dental health . Over the next 30 days, patient will demonstrate improved health management independence as evidenced by outreach to primary care provider office or dentist office for management of dental care needs and pain management  Interventions:  . Evaluation of current treatment plan related to dental health concerns and patient's adherence to plan as established by provider.  Reviewed medications with patient and discussed safe use of over the counter analgesics -discussed his OTC pain control options. He is unable to take NSAIDS due to stage 3 CKD and I advised him to continue to avoid those.  Recommended 2 extra strength Tylenol which he reported had not helped previously. I also recommended an OTC topical pain reliever like Anbesol or Orajel. I also advised him to read the directions carefully and cautioned him against overuse.   [name] with LCSW regarding financial barriers preventing appropriate dental care . Provided telephone numbers of local dentists that accept adult Medicaid   Patient Self Care Activities:  . Self administers medications as prescribed . Performs IADL's independently . Performs ADL's independently   Plan:  . Patient will adhere to prescribed pain management regimen . Patient provided telephone  number for [facility] in [facility] [phone]. He was unable to reach Dr Kermit Balo in [place] but will call [place] this evening to schedule an appointment tomorrow  . RNCM will follow up with patient by phone over the next 24-48 hours to assess patient progress, pain management, and ongoing plans for dental care   Please see past updates related to this goal by clicking on the "Past Updates" button in the selected goal      . "I need help with this pain/tingling in my shoulder and arm" (pt-stated)       Current Barriers:  [name] Knowledge Deficits related to appropriate ways to manage pain . Knowledge Deficits related to cause of pain/tingling . Film/video editor.   . Non adherence to recommended medication regimen   Nurse Case Manager Clinical Goal(s):  [name] Over the next 7 days, patient will demonstrate improved adherence to recommended treatment plan for neck/shoulder/arm pain/tingling as evidenced by an improvment or resolution in pain. . Over the next 7 days, patient will demonstrate improved health management independence as evidenced byability to explain the association between recurrent shoulder/arm pain and activities that exacerbate the pain.   Interventions:  .  Advised patient to call me to schedule an appointment tomorrow if the pain worsens in the morning as it has the past two days  . Advised patient that an appointment with his PCP is needed if pain worsens or persists . Advised patient to apply ice/heat, whichever feels better, to affected area for 20 minutes at a time 3 to 5 times a day . Advised patient to take extra strength Tylenol for pain  . Advised patient to use muscle rubs or patches, like Salanpas, over the affected area as directed on packaging . Questioned patient about his current activities when the pain started and any associated symptoms  Patient Self Care Activities:  . Performs ADL's independently . Performs IADL's independently . Calls  provider office for new concerns or questions  Plan:  . Patient will call me in the morning with current pain status . RNCM will reach out to patient by phone over the next 24 to 48 hours if he does not call me  *initial goal documentation          Plan CCM team will follow via telephone over the next 7 days   [name], RN-BC, BSN Nurse Case Manager [place] [phone]

## 2018-11-28 ENCOUNTER — Encounter: Payer: Self-pay | Admitting: *Deleted

## 2018-11-28 DIAGNOSIS — N183 Chronic kidney disease, stage 3 (moderate): Secondary | ICD-10-CM

## 2018-11-28 DIAGNOSIS — E1122 Type 2 diabetes mellitus with diabetic chronic kidney disease: Secondary | ICD-10-CM | POA: Insufficient documentation

## 2018-12-05 ENCOUNTER — Ambulatory Visit (INDEPENDENT_AMBULATORY_CARE_PROVIDER_SITE_OTHER): Payer: Medicare Other | Admitting: Licensed Clinical Social Worker

## 2018-12-05 ENCOUNTER — Telehealth: Payer: Medicare Other

## 2018-12-05 ENCOUNTER — Ambulatory Visit: Payer: Medicare Other | Admitting: *Deleted

## 2018-12-05 DIAGNOSIS — F329 Major depressive disorder, single episode, unspecified: Secondary | ICD-10-CM | POA: Diagnosis not present

## 2018-12-05 DIAGNOSIS — I10 Essential (primary) hypertension: Secondary | ICD-10-CM

## 2018-12-05 DIAGNOSIS — Z794 Long term (current) use of insulin: Secondary | ICD-10-CM

## 2018-12-05 DIAGNOSIS — K089 Disorder of teeth and supporting structures, unspecified: Secondary | ICD-10-CM

## 2018-12-05 DIAGNOSIS — E1159 Type 2 diabetes mellitus with other circulatory complications: Secondary | ICD-10-CM | POA: Diagnosis not present

## 2018-12-05 DIAGNOSIS — F32A Depression, unspecified: Secondary | ICD-10-CM

## 2018-12-05 DIAGNOSIS — E1142 Type 2 diabetes mellitus with diabetic polyneuropathy: Secondary | ICD-10-CM | POA: Diagnosis not present

## 2018-12-05 DIAGNOSIS — I152 Hypertension secondary to endocrine disorders: Secondary | ICD-10-CM

## 2018-12-05 NOTE — Chronic Care Management (AMB) (Signed)
  Care Management Note   Dustin Cruz is a 53 y.o. year old male who sees [name], DO for primary care. Dr. Lajuana Ripple asked the CCM team to consult the patient for assistance with chronic disease management related to dental needs of client and nursing needs of client.   Review of patient status, including review of consultants reports, relevant laboratory and other test results, and collaboration with appropriate care team members and the patient's provider was performed as part of comprehensive patient evaluation and provision of chronic care management services. Telephone outreach to patient today to introduce CCM services.   I reached out to [place] by phone today.      Current Barriers:   Dental pain issues  Financial Challenges  Clinical Social Work Clinical Goal(s):    Over the next 30 days, client will work with SW to address concerns related to dental care needs of client   Interventions:  LCSW previously referred client to RN CM for nursing needs   Provided patient with information regarding dentist, Dr. Costella Hatcher, [phone], for patient to contact to schedule client dental appointment  Patient interviewed related to psychosocial needs of client.   LCSW talked with client about transport needs of client  Patient Self Care Activities:   Self administers medications as prescribed  Attends all scheduled provider appointments  Performs ADL's independently  Plan:   Client to call LCSW as needed to discuss psychosocial needs of client   LCSW to call client in next 2 weeks to discuss current client needs  Client to communicate with RN CM to discuss nursing needs of client  Client to attend scheduled medical appointments  Client to call dentist , Dr. Costella Hatcher, at [phone], to schedule client dental appointment   *initial goal documentation   Follow Up Plan: LCSW to call client in next 2 weeks to discuss current client  needs.  Norva Riffle.Dejae Bernet MSW, LCSW Licensed Clinical Social Worker [facility] [phone]

## 2018-12-05 NOTE — Patient Instructions (Signed)
Licensed Clinical Water engineer Provided:  No  Goals we discussed today:      Current Barriers:  Dental pain issues  Financial Challenges  Clinical Social Work Clinical Goal(s):  Over the next30days, client will work with SW to address concerns related to dental care needs of client  Interventions:  LCSW previously referred client to RN CM for nursing needs  Provided patient with information regarding dentist, Dr. Costella Hatcher, [phone], for patient to contact to schedule client dental appointment  Patient interviewedrelated to psychosocial needs of client.   LCSW talked with client about transport needs of client  Patient Self Care Activities:  Self administers medications as prescribed  Attends all scheduled provider appointments  Performs ADL's independently  Plan:  Client to call LCSW as needed to discuss psychosocial needs of client   LCSW to call client in next 2 weeks to discuss current client needs  Client to communicate with RN CM to discuss nursing needs of client  Client to attend scheduled medical appointments  Client to call dentist , Dr. Costella Hatcher, at [phone], to schedule client dental appointment   *initial goal documentation  Follow Up Plan:LCSW to call client in next 2 weeks to discuss current client needs.  Patient verbalized understanding of patient instructions and declined a print copy of patient instructions  Norva Riffle.Autry Droege MSW, LCSW Licensed Clinical Social Worker [facility] Care [facility] [phone]

## 2018-12-06 ENCOUNTER — Ambulatory Visit: Payer: Medicare Other

## 2018-12-06 DIAGNOSIS — E1142 Type 2 diabetes mellitus with diabetic polyneuropathy: Secondary | ICD-10-CM

## 2018-12-06 DIAGNOSIS — Z794 Long term (current) use of insulin: Secondary | ICD-10-CM

## 2018-12-06 DIAGNOSIS — K089 Disorder of teeth and supporting structures, unspecified: Secondary | ICD-10-CM

## 2018-12-06 NOTE — Chronic Care Management (AMB) (Signed)
  Chronic Care Management   Telephone Note  12/06/2018 Name: Dustin Cruz MRN: [id number] DOB: 09/23/1966  Attempted to contact patient by telephone to follow up on his oral/dental pain and if he has been able to schedule an appointment with a dentist. I have given him the telephone number for two local adult dentists that are listed as taking Medicare and an additional dentist has been recommended by Theadore Nan, LCSW. As of the last CCM contact yesterday, he had not scheduled an appointment.   I also need to setup a telephone or face to face visit with him to further discuss his ongoing chronic medical conditions and to establish patient goals centered about self management of these chronic conditions.    Follow up plan: RNCM will reach back out by phone over the next 5 days.  [name], RN-BC, BSN Nurse Case Manager [place] [phone]

## 2018-12-09 ENCOUNTER — Ambulatory Visit: Payer: Self-pay | Admitting: Licensed Clinical Social Worker

## 2018-12-09 ENCOUNTER — Ambulatory Visit (INDEPENDENT_AMBULATORY_CARE_PROVIDER_SITE_OTHER): Payer: Medicare Other | Admitting: Licensed Clinical Social Worker

## 2018-12-09 DIAGNOSIS — I10 Essential (primary) hypertension: Secondary | ICD-10-CM

## 2018-12-09 DIAGNOSIS — K089 Disorder of teeth and supporting structures, unspecified: Secondary | ICD-10-CM

## 2018-12-09 DIAGNOSIS — E1122 Type 2 diabetes mellitus with diabetic chronic kidney disease: Secondary | ICD-10-CM | POA: Diagnosis not present

## 2018-12-09 DIAGNOSIS — F32A Depression, unspecified: Secondary | ICD-10-CM

## 2018-12-09 DIAGNOSIS — E1159 Type 2 diabetes mellitus with other circulatory complications: Secondary | ICD-10-CM

## 2018-12-09 DIAGNOSIS — Z794 Long term (current) use of insulin: Secondary | ICD-10-CM

## 2018-12-09 DIAGNOSIS — N183 Chronic kidney disease, stage 3 unspecified: Secondary | ICD-10-CM

## 2018-12-09 DIAGNOSIS — F329 Major depressive disorder, single episode, unspecified: Secondary | ICD-10-CM

## 2018-12-09 NOTE — Patient Instructions (Addendum)
Licensed Clinical Water engineer Provided:  No  Goals we discussed today:    Current Barriers:  Dental pain issues  Financial Challenges  Clinical Social Work Clinical Goal(s):  Over the next30days, client will work with SW to address concerns related to dental care needs of client  Interventions:  LCSWpreviously referred client to RN CM for nursing needs  Provided patientwith information regarding dentist, Dustin Cruz, [phone], for patient to contact to schedule client dental appointment  Patient interviewedrelated to psychosocial needs of client.  Encouraged patient to call Dustin Cruz to schedule client dental appointment   Patient Self Care Activities:  Self administers medications as prescribed  Attends all scheduled provider appointments  Performs ADL's independently  Plan:  Client to call LCSW as needed to discuss psychosocial needs of client   LCSW to call client in next 2 weeks to discuss current client needs  Client to communicate with RN CM to discuss nursing needs of client  Client to calldentist , Dustin Cruz, at [phone], to schedule client dental appointment  *initial goal documentation  Follow Up Plan:LCSW to call client in next 2 weeks to discuss current client needs.  The patient verbalized understanding of instructions provided today and declined a print copy of patient instruction materials.   Dustin Cruz.Dustin Cruz MSW, LCSW Licensed Clinical Social Worker [facility] [phone]

## 2018-12-09 NOTE — Progress Notes (Signed)
Encounter opened in error.  Ashby Moskal S.Bronislaw Switzer MSW, LCSW Licensed Clinical Social Worker [facility] [phone] 

## 2018-12-09 NOTE — Chronic Care Management (AMB) (Signed)
  Care Management Note   Dustin Cruz is a 53 y.o. year old male who sees Dustin Norlander, DO for primary care. Dustin Cruz asked the CCM team to consult the patient for assistance with chronic disease management related to psychosocial assessment and dental care needs.   I reached out to Dustin Cruz by phone today.   [name] was given information about Chronic Care Management services today including:  1. CCM service includes personalized support from designated clinical staff supervised by his physician, including individualized plan of care and coordination with other care providers 2. 24/7 contact phone numbers for assistance for urgent and routine care needs. 3. Service will only be billed when office clinical staff spend 20 minutes or more in a month to coordinate care. 4. Only one practitioner may furnish and bill the service in a calendar month. 5. The patient may stop CCM services at any time (effective at the end of the month) by phone call to the office staff. 6. The patient will be responsible for cost sharing (co-pay) of up to 20% of the service fee (after annual deductible is met). Patient agreed to services and verbal consent obtained.    Review of patient status, including review of consultants reports, relevant laboratory and other test results, and collaboration with appropriate care team members and the patient's provider was performed as part of comprehensive patient evaluation and provision of chronic care management services.    LCSW has spoken with client regarding client dental history. Client cannot remember dentist he had seen in the past. LCSW was not able to locate note regarding past dental care for client. LCSW has called dental practice of Dr. Costella Cruz in [place], [place].  Representative at that dental practice said that client would have to have a referral from a dentist for Dr. Luvenia Cruz to see patient for dental care.  LCSW has shared this information with  Dustin Sicilian, RN. Dustin Sicilian, RN, also trying to determine past dentist that saw client.       Current Barriers:  Dental pain issues  Financial Challenges  Clinical Social Work Clinical Goal(s):  Over the next30days, client will work with SW to address concerns related to dental care needs of client Interventions:  LCSW previously referred client to RN CM for nursing needs  Provided patient with information regarding dentist, Dr. Costella Cruz, [phone], for patient to contact to schedule client dental appointment  Patient interviewedrelated to psychosocial needs of client.   Encouraged patient to call Dr. Costella Cruz to schedule client dental appointment   Patient Self Care Activities:  Self administers medications as prescribed  Attends all scheduled provider appointments  Performs ADL's independently  Plan:  Client to call LCSW as needed to discuss psychosocial needs of client   LCSW to call client in next 2 weeks to discuss current client needs  Client to communicate with RN CM to discuss nursing needs of client  Client to call dentist , Dr. Costella Cruz, at [phone], to schedule client dental appointment   *initial goal documentation  Follow Up Plan:LCSW to call client in next 2 weeks to discuss current client needs.  Dustin Cruz MSW, LCSW Licensed Clinical Social Worker [facility] [phone]

## 2018-12-10 NOTE — Progress Notes (Signed)
Mr Yom was scheduled for a [facility] telephone call as well as an LCSW telephone call. He Theadore Nan, LCSW was able to talk with him and he did not have any additional nursing needs at that time.   Plan CCM team will continue to follow up as planned over the next week.   [name], RN-BC, BSN Nurse Case Manager [place] [phone]

## 2018-12-13 NOTE — Patient Instructions (Signed)
Visit Information  Goals Addressed            This Visit's Progress   . "I need help with this pain in my mouth" (pt-stated)       Current Barriers:  [name] Knowledge Deficits related to acute and long term management of dental health including pain management . Film/video editor. - LCSW is actively working with patient in an effort to provide support and resources related to financial constraints preventing proper dental care  Dustin Cruz was evaluated at the ED on 11/17/18. The following was noted in the physical exam:   "Dentition: Abnormal dentition(Loose bottom left incisor. No surrounding erythema or signs of infection.).Gingival swellingand dental cariespresent. No dental tendernessor dental abscesses."  The following was noted in the ED discharge plan:  "53 year old male presents with a spot on left incisor for 1 month. He is requesting that I pulled his tooth tonight. Patient is mildly hypertensive but otherwise vital signs are normal. Advised him that we do not pull teeth. Not see any signs of infection on exam therefore will forego any antibiotics. He was given dental resources."  Patient has not followed up with a dentist and his primary concern at this time is pain management until he is able to see a dentist.  Nurse Case Manager Clinical Goal(s):  [name] Over the next 30 days, patient will verbalize basic understanding of dental disease process and self health management plan as evidenced by participation in development of plan of care and verbalization of understanding of long term plan for management of dental health . Over the next 30 days, patient will demonstrate improved health management independence as evidenced by outreach to primary care provider office or dentist office for management of dental care needs and pain management  Interventions:  . Evaluation of current treatment plan related to dental health concerns and patient's adherence to plan as established by  provider.  Reviewed medications with patient and discussed safe use of over the counter analgesics -discussed his OTC pain control options. He is unable to take NSAIDS due to stage 3 CKD and I advised him to continue to avoid those.  Recommended 2 extra strength Tylenol which he reported had not helped previously. I also recommended an OTC topical pain reliever like Anbesol or Orajel. I also advised him to read the directions carefully and cautioned him against overuse.   Dustin Cruz with LCSW regarding financial barriers preventing appropriate dental care . Provided telephone numbers of local dentists that accept adult Medicaid   Patient Self Care Activities:  . Self administers medications as prescribed . Performs IADL's independently . Performs ADL's independently   Plan:  . Patient will adhere to prescribed pain management regimen . Patient provided telephone number for [facility] in [facility] [phone]. He was unable to reach Dr Dustin Cruz in [place] but will call [place] this evening to schedule an appointment tomorrow  . RNCM will follow up with patient by phone over the next 24-48 hours to assess patient progress, pain management, and ongoing plans for dental care   Please see past updates related to this goal by clicking on the "Past Updates" button in the selected goal      . "I need help with this pain/tingling in my shoulder and arm" (pt-stated)       Current Barriers:  [name] Knowledge Deficits related to appropriate ways to manage pain . Knowledge Deficits related to cause of pain/tingling . Film/video editor.   [name]  Non adherence to recommended medication regimen   Nurse Case Manager Clinical Goal(s):  [name] Over the next 7 days, patient will demonstrate improved adherence to prescribed treatment plan for neck/shoulder/arm pain/tingling as evidenced by an improvment or resolution in pain. . Over the next 7 days, patient will demonstrate improved health  management independence as evidenced byability to explain the association between recurrent shoulder/arm pain and activities that exacerbate the pain.   Interventions:  . Advised patient to call me to schedule an appointment tomorrow if the pain worsens in the morning as it has the past two days  . Advised patient that an appointment with his PCP is needed if pain worsens or persists . Advised patient to apply ice/heat, whichever feels better, to affected area for 20 minutes at a time 3 to 5 times a day . Advised patient to take extra strength Tylenol for pain  . Advised patient to use muscle rubs or patches, like Salanpas, over the affected area as directed on packaging . Questioned patient about his current activities when the pain started and any associated symptoms  Patient Self Care Activities:  . Performs ADL's independently . Performs IADL's independently . Calls provider office for new concerns or questions  Plan:  . Patient will call me in the morning with current pain status . RNCM will reach out to patient by phone over the next 24 to 48 hours if he does not call me  *initial goal documentation         The patient verbalized understanding of instructions provided today and declined a print copy of patient instruction materials.   The CM team will reach out to the patient again over the next 7 days.   Dustin Sicilian, RN-BC, BSN Nurse Case Manager [place] [phone]

## 2018-12-18 ENCOUNTER — Ambulatory Visit: Payer: Self-pay | Admitting: Licensed Clinical Social Worker

## 2018-12-18 DIAGNOSIS — E1142 Type 2 diabetes mellitus with diabetic polyneuropathy: Secondary | ICD-10-CM

## 2018-12-18 DIAGNOSIS — K089 Disorder of teeth and supporting structures, unspecified: Secondary | ICD-10-CM

## 2018-12-18 DIAGNOSIS — Z794 Long term (current) use of insulin: Secondary | ICD-10-CM

## 2018-12-18 DIAGNOSIS — I10 Essential (primary) hypertension: Principal | ICD-10-CM

## 2018-12-18 DIAGNOSIS — E1159 Type 2 diabetes mellitus with other circulatory complications: Secondary | ICD-10-CM

## 2018-12-18 DIAGNOSIS — F32A Depression, unspecified: Secondary | ICD-10-CM

## 2018-12-18 DIAGNOSIS — F329 Major depressive disorder, single episode, unspecified: Secondary | ICD-10-CM

## 2018-12-18 DIAGNOSIS — Z720 Tobacco use: Secondary | ICD-10-CM

## 2018-12-18 NOTE — Patient Instructions (Signed)
Licensed Clinical Social Worker Visit Information  Goals we discussed today:  Goals Addressed            This Visit's Progress   . Client said he had dental care issues and needs to see dentist for care (pt-stated)       Barriers:  Dental pain issues Financial Challenges  Clinical Social Work Goal: Over next 30 days , client  will work with LCSW to address concerns related to dental care needs of client  Interventions: LCSW referred client to RN CM for nursing needs Provided patient with information regarding dentist, Dr. Costella Hatcher, [phone], for patient to contact to schedule client dental appointment Patient interviewed related to psychosocial needs of client . Encouraged patient to call Dr. Costella Hatcher to schedule client dental appointment.   Patient Self Care Activities: Self administers medications as prescribed Attends all scheduled provider appointments Performs ADL's independently  Plan: Client to call LCSW as needed to discuss psychosocial needs of client LCSW to call client in next 3 weeks to discuss current client needs including dental needs of client Client to communicate with RN CM to discuss nursing needs of client.  Client to call dentist, Dr. Costella Hatcher , at [phone], to schedule client dental appointment  *Initial goal documentation       Materials Provided: No  Follow Up Plan: LCSW to call client in next 3 weeks to discuss current client needs including dental needs of client  The patient verbalized understanding of instructions provided today and declined a print copy of patient instruction materials.   Norva Riffle.Jayko Voorhees MSW, LCSW Licensed Clinical Social Worker [facility] [phone]

## 2018-12-18 NOTE — Chronic Care Management (AMB) (Signed)
  Care Management Note   Dustin Cruz is a 53 y.o. year old male who is a primary care patient of [name], DO. The CM team was consult for assistance with chronic disease management and care coordination.   I reached out to [place] by phone today.   Review of patient status, including review of consultants reports, relevant laboratory and other test results, and collaboration with appropriate care team members and the patient's provider was performed as part of comprehensive patient evaluation and provision of chronic care management services.   Social Determinants of Health:  Client is at risk for Tobacco exposure, at risk for stress, and at risk for food insecurity.  Goals Addressed            This Visit's Progress   . Client said he had dental care issues and needs to see dentist for care (pt-stated)       Barriers:    Dental pain issues   Financial Challenges  Clinical Social Work Goal:  Over next 30 days , client  will work with LCSW to address concerns related to dental care needs of client  Interventions:   LCSW referred client to RN CM for nursing needs     LCSW recommended client call [facility] to schedule client dental appointment    Provided patient with information regarding dentist, Dr. Costella Hatcher, [phone], for patient to contact to schedule client dental appointment     Patient interviewed related to psychosocial needs of client .    Encouraged patient to call Dr. Costella Hatcher to schedule client dental appointment.   Encouraged client to communicate with RN CM regarding client nursing needs  Patient Self Care Activities:    Self administers medications as prescribed    Attends all scheduled provider appointments    Performs ADL's independently  Plan:   Client to call LCSW as needed to discuss psychosocial needs of client    LCSW to call client in next 3 weeks to discuss current client needs  including dental needs of client    Client to communicate with RN CM to discuss nursing needs of client.     Client to call dentist, Dr. Costella Hatcher , at [phone], to schedule client dental appointment  Client to attend scheduled medical appointments  *Initial goal documentation     Note:  Client reported to LCSW that client had called [facility] in [facility], [place] and that practice was not taking new dental patients currently. Client said he also called dentist, Dr. Kermit Balo ,in [place], [place] and that practice of Dr. Kermit Balo, dentist, was not taking new dental patients at present  Follow Up Plan: LCSW to call client in next 3 weeks to discuss current client needs including dental needs of client  Norva Riffle.Zosia Lucchese MSW, LCSW Licensed Clinical Social Worker [facility] [phone]

## 2018-12-23 ENCOUNTER — Other Ambulatory Visit: Payer: Self-pay

## 2018-12-23 ENCOUNTER — Encounter: Payer: Self-pay | Admitting: Family Medicine

## 2018-12-23 ENCOUNTER — Ambulatory Visit (INDEPENDENT_AMBULATORY_CARE_PROVIDER_SITE_OTHER): Payer: Medicare Other

## 2018-12-23 ENCOUNTER — Ambulatory Visit (INDEPENDENT_AMBULATORY_CARE_PROVIDER_SITE_OTHER): Payer: Medicare Other | Admitting: Family Medicine

## 2018-12-23 VITALS — BP 124/75 | HR 97 | Temp 98.7°F | Ht 74.0 in | Wt 201.0 lb

## 2018-12-23 DIAGNOSIS — M79641 Pain in right hand: Secondary | ICD-10-CM | POA: Diagnosis not present

## 2018-12-23 DIAGNOSIS — J441 Chronic obstructive pulmonary disease with (acute) exacerbation: Secondary | ICD-10-CM | POA: Diagnosis not present

## 2018-12-23 DIAGNOSIS — S62336A Displaced fracture of neck of fifth metacarpal bone, right hand, initial encounter for closed fracture: Secondary | ICD-10-CM | POA: Diagnosis not present

## 2018-12-23 DIAGNOSIS — S62398A Other fracture of other metacarpal bone, initial encounter for closed fracture: Secondary | ICD-10-CM | POA: Diagnosis not present

## 2018-12-23 MED ORDER — DOXYCYCLINE HYCLATE 100 MG PO TABS: 100.0000 mg | ORAL_TABLET | Freq: Two times a day (BID) | ORAL | 0 refills | Status: AC

## 2018-12-23 MED ORDER — BENZONATATE 100 MG PO CAPS: 100.0000 mg | ORAL_CAPSULE | Freq: Three times a day (TID) | ORAL | 0 refills | Status: DC | PRN

## 2018-12-23 MED ORDER — PREDNISONE 20 MG PO TABS: 40.0000 mg | ORAL_TABLET | Freq: Every day | ORAL | 0 refills | Status: AC

## 2018-12-23 NOTE — Patient Instructions (Signed)
I have sent in Doxycycline (take with food. This is an antibiotic).  I sent Prednisone (for wheezes and shortness of breath).  Keep an eye on your sugar.  I sent in Tesslon perles for cough.    I will call you with the xray result for your hand.  Use your albuterol inhaler every 6 hours for the next 2 days.  Then use if only if needed every 6 hours.   Chronic Obstructive Pulmonary Disease Exacerbation Chronic obstructive pulmonary disease (COPD) is a long-term (chronic) lung problem. In COPD, the flow of air from the lungs is limited. COPD exacerbations are times that breathing gets worse and you need more than your normal treatment. Without treatment, they can be life threatening. If they happen often, your lungs can become more damaged. If your COPD gets worse, your doctor may treat you with:  Medicines.  Oxygen.  Different ways to clear your airway, such as using a mask. Follow these instructions at home: Medicines  Take over-the-counter and prescription medicines only as told by your doctor.  If you take an antibiotic or steroid medicine, do not stop taking the medicine even if you start to feel better.  Keep up with shots (vaccinations) as told by your doctor. Be sure to get a yearly (annual) flu shot. Lifestyle  Do not smoke. If you need help quitting, ask your doctor.  Eat healthy foods.  Exercise regularly.  Get plenty of sleep.  Avoid tobacco smoke and other things that can bother your lungs.  Wash your hands often with soap and water. This will help keep you from getting an infection. If you cannot use soap and water, use hand sanitizer.  During flu season, avoid areas that are crowded with people. General instructions  Drink enough fluid to keep your pee (urine) clear or pale yellow. Do not do this if your doctor has told you not to.  Use a cool mist machine (vaporizer).  If you use oxygen or a machine that turns medicine into a mist (nebulizer), continue to use  it as told.  Follow all instructions for rehabilitation. These are steps you can take to make your body work better.  Keep all follow-up visits as told by your doctor. This is important. Contact a doctor if:  Your COPD symptoms get worse than normal. Get help right away if:  You are short of breath and it gets worse.  You have trouble talking.  You have chest pain.  You cough up blood.  You have a fever.  You keep throwing up (vomiting).  You feel weak or you pass out (faint).  You feel confused.  You are not able to sleep because of your symptoms.  You are not able to do daily activities. Summary  COPD exacerbations are times that breathing gets worse and you need more treatment than normal.  COPD exacerbations can be very serious and may cause your lungs to become more damaged.  Do not smoke. If you need help quitting, ask your doctor.  Stay up-to-date on your shots. Get a flu shot every year. This information is not intended to replace advice given to you by your health care provider. Make sure you discuss any questions you have with your health care provider. Document Released: 09/14/2011 Document Revised: 10/30/2016 Document Reviewed: 10/30/2016 Elsevier Interactive Patient Education  2019 [employer].
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
## 2018-12-26 ENCOUNTER — Telehealth: Payer: Medicare Other | Admitting: *Deleted

## 2019-01-06 ENCOUNTER — Ambulatory Visit (INDEPENDENT_AMBULATORY_CARE_PROVIDER_SITE_OTHER): Payer: Medicare Other

## 2019-01-06 ENCOUNTER — Ambulatory Visit (INDEPENDENT_AMBULATORY_CARE_PROVIDER_SITE_OTHER): Payer: Medicare Other | Admitting: Family Medicine

## 2019-01-06 ENCOUNTER — Encounter: Payer: Self-pay | Admitting: Family Medicine

## 2019-01-06 ENCOUNTER — Other Ambulatory Visit: Payer: Self-pay

## 2019-01-06 VITALS — BP 128/79 | HR 84 | Temp 96.9°F | Ht 74.0 in | Wt 196.0 lb

## 2019-01-06 DIAGNOSIS — R200 Anesthesia of skin: Secondary | ICD-10-CM

## 2019-01-06 DIAGNOSIS — R29898 Other symptoms and signs involving the musculoskeletal system: Secondary | ICD-10-CM | POA: Diagnosis not present

## 2019-01-06 DIAGNOSIS — L739 Follicular disorder, unspecified: Secondary | ICD-10-CM

## 2019-01-06 DIAGNOSIS — M545 Low back pain: Secondary | ICD-10-CM | POA: Diagnosis not present

## 2019-01-06 MED ORDER — SULFAMETHOXAZOLE-TRIMETHOPRIM 800-160 MG PO TABS: 1.0000 | ORAL_TABLET | Freq: Two times a day (BID) | ORAL | 0 refills | Status: AC

## 2019-01-06 NOTE — Patient Instructions (Addendum)
Antibiotic sent to pharmacy.  Take 1 tablet twice daily for 10 days.   Folliculitis  Folliculitis is inflammation of the hair follicles. Folliculitis most commonly occurs on the scalp, thighs, legs, back, and buttocks. However, it can occur anywhere on the body. What are the causes? This condition may be caused by:  A bacterial infection (common).  A fungal infection.  A viral infection.  Coming into contact with certain chemicals, especially oils and tars.  Shaving or waxing.  Applying greasy ointments or creams to your skin often. Long-lasting folliculitis and folliculitis that keeps coming back can be caused by bacteria that live in the nostrils. What increases the risk? This condition is more likely to develop in people with:  A weakened immune system.  Diabetes.  Obesity. What are the signs or symptoms? Symptoms of this condition include:  Redness.  Soreness.  Swelling.  Itching.  Small white or yellow, pus-filled, itchy spots (pustules) that appear over a reddened area. If there is an infection that goes deep into the follicle, these may develop into a boil (furuncle).  A group of closely packed boils (carbuncle). These tend to form in hairy, sweaty areas of the body. How is this diagnosed? This condition is diagnosed with a skin exam. To find what is causing the condition, your health care provider may take a sample of one of the pustules or boils for testing. How is this treated? This condition may be treated by:  Applying warm compresses to the affected areas.  Taking an antibiotic medicine or applying an antibiotic medicine to the skin.  Applying or bathing with an antiseptic solution.  Taking an over-the-counter medicine to help with itching.  Having a procedure to drain any pustules or boils. This may be done if a pustule or boil contains a lot of pus or fluid.  Laser hair removal. This may be done to treat long-lasting folliculitis. Follow these  instructions at home:  If directed, apply heat to the affected area as often as told by your health care provider. Use the heat source that your health care provider recommends, such as a moist heat [place] or a heating pad. ? Place a towel between your skin and the heat source. ? Leave the heat on for 20-30 minutes. ? Remove the heat if your skin turns bright red. This is especially important if you are unable to feel pain, heat, or cold. You may have a greater risk of getting burned.  If you were prescribed an antibiotic medicine, use it as told by your health care provider. Do not stop using the antibiotic even if you start to feel better.  Take over-the-counter and prescription medicines only as told by your health care provider.  Do not shave irritated skin.  Keep all follow-up visits as told by your health care provider. This is important. Get help right away if:  You have more redness, swelling, or pain in the affected area.  Red streaks are spreading from the affected area.  You have a fever. This information is not intended to replace advice given to you by your health care provider. Make sure you discuss any questions you have with your health care provider.   Lumbosacral Radiculopathy Lumbosacral radiculopathy is a condition that involves the spinal nerves and nerve roots in the low back and bottom of the spine. The condition develops when these nerves and nerve roots move out of place or become inflamed and cause symptoms. What are the causes? This condition may be  caused by:  Pressure from a disk that bulges out of place (herniated disk). A disk is a plate of soft cartilage that separates bones in the spine.  Disk changes that occur with age (disk degeneration).  A narrowing of the bones of the lower back (spinal stenosis).  A tumor.  An infection.  An injury that places sudden pressure on the disks that cushion the bones of your lower spine. What increases the  risk? You are more likely to develop this condition if:  You are a male who is 42-55 years old.  You are a male who is 65-32 years old.  You use improper technique when lifting things.  You are overweight or live a sedentary lifestyle.  Your work requires frequent lifting.  You smoke.  You do repetitive activities that strain the spine. What are the signs or symptoms? Symptoms of this condition include:  Pain that goes down from your back into your legs (sciatica), usually on one side of the body. This is the most common symptom. The pain may be worse with sitting, coughing, or sneezing.  Pain and numbness in your legs.  Muscle weakness.  Tingling.  Loss of bladder control or bowel control. How is this diagnosed? This condition may be diagnosed based on:  Your symptoms and medical history.  A physical exam. If the pain is lasting, you may have tests, such as:  MRI scan.  X-ray.  CT scan.  A type of X-ray used to examine the spinal canal after injecting a dye into your spine (myelogram).  A test to measure how electrical impulses move through a nerve (nerve conduction study). How is this treated? Treatment may depend on the cause of the condition and may include:  Working with a physical therapist.  Taking pain medicine.  Applying heat and ice to affected areas.  Doing stretches to improve flexibility.  Doing exercises to strengthen back muscles.  Having chiropractic spinal manipulation.  Using transcutaneous electrical nerve stimulation (TENS) therapy.  Getting a steroid injection in the spine. In some cases, no treatment is needed. If the condition is long-lasting (chronic), or if symptoms are severe, treatment may involve surgery or lifestyle changes, such as following a weight-loss plan. Follow these instructions at home: Activity  Avoid bending and other activities that make the problem worse.  Maintain a proper position when standing or  sitting: ? When standing, keep your upper back and neck straight, with your shoulders pulled back. Avoid slouching. ? When sitting, keep your back straight and relax your shoulders. Do not round your shoulders or pull them backward.  Do not sit or stand in one place for long periods of time.  Take brief periods of rest throughout the day. This will reduce your pain. It is usually better to rest by lying down or standing, not sitting.  When you are resting for longer periods, mix in some mild activity or stretching between periods of rest. This will help to prevent stiffness and pain.  Get regular exercise. Ask your health care provider what activities are safe for you. If you were shown how to do any exercises or stretches, do them as directed by your health care provider.  Do not lift anything that is heavier than 10 lb (4.5 kg) or the limit that you are told by your health care provider. Always use proper lifting technique, which includes: ? Bending your knees. ? Keeping the load close to your body. ? Avoiding twisting. Managing pain  If directed,  put ice on the affected area: ? Put ice in a plastic bag. ? Place a towel between your skin and the bag. ? Leave the ice on for 20 minutes, 2-3 times a day.  If directed, apply heat to the affected area as often as told by your health care provider. Use the heat source that your health care provider recommends, such as a moist heat [place] or a heating pad. ? Place a towel between your skin and the heat source. ? Leave the heat on for 20-30 minutes. ? Remove the heat if your skin turns bright red. This is especially important if you are unable to feel pain, heat, or cold. You may have a greater risk of getting burned.  Take over-the-counter and prescription medicines only as told by your health care provider. General instructions  Sleep on a firm mattress in a comfortable position. Try lying on your side with your knees slightly bent. If you  lie on your back, put a pillow under your knees.  Do not drive or use heavy machinery while taking prescription pain medicine.  If your health care provider prescribed a diet or exercise program, follow it as directed.  Keep all follow-up visits as told by your health care provider. This is important. Contact a health care provider if:  Your pain does not improve over time, even when taking pain medicines. Get help right away if:  You develop severe pain.  Your pain suddenly gets worse.  You develop increasing weakness in your legs.  You lose the ability to control your bladder or bowel.  You have difficulty walking or balancing.  You have a fever. Summary  Lumbosacral radiculopathy is a condition that occurs when the spinal nerves and nerve roots in the lower part of the spine move out of place or become inflamed and cause symptoms.  Symptoms include pain, numbness, and tingling that go down from your back into your legs (sciatica), muscle weakness, and loss of bladder control or bowel control.  If directed, apply ice or heat to the affected area as told by your health care provider.  Follow instructions about activity, rest, and proper lifting technique. This information is not intended to replace advice given to you by your health care provider. Make sure you discuss any questions you have with your health care provider. Document Released: 09/25/2005 Document Revised: 09/13/2017 Document Reviewed: 09/13/2017 Elsevier Interactive Patient Education  2019 [place] Released: 12/04/2001 Document Revised: 04/14/2016 Document Reviewed: 07/16/2015 Elsevier Interactive Patient Education  2019 [employer].
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
## 2019-01-07 ENCOUNTER — Ambulatory Visit: Payer: Self-pay | Admitting: Licensed Clinical Social Worker

## 2019-01-07 DIAGNOSIS — F32A Depression, unspecified: Secondary | ICD-10-CM

## 2019-01-07 DIAGNOSIS — K089 Disorder of teeth and supporting structures, unspecified: Secondary | ICD-10-CM

## 2019-01-07 DIAGNOSIS — J441 Chronic obstructive pulmonary disease with (acute) exacerbation: Secondary | ICD-10-CM

## 2019-01-07 DIAGNOSIS — F329 Major depressive disorder, single episode, unspecified: Secondary | ICD-10-CM

## 2019-01-07 DIAGNOSIS — Z794 Long term (current) use of insulin: Secondary | ICD-10-CM

## 2019-01-07 DIAGNOSIS — E1159 Type 2 diabetes mellitus with other circulatory complications: Secondary | ICD-10-CM

## 2019-01-07 DIAGNOSIS — Z720 Tobacco use: Secondary | ICD-10-CM

## 2019-01-07 DIAGNOSIS — E1142 Type 2 diabetes mellitus with diabetic polyneuropathy: Secondary | ICD-10-CM

## 2019-01-07 DIAGNOSIS — I10 Essential (primary) hypertension: Principal | ICD-10-CM

## 2019-01-07 NOTE — Patient Instructions (Signed)
Licensed Clinical Social Worker Visit Information  Goals we discussed today:  Goal:           This Visit's Progress   . Client said he had dental care issues and needs to see dentist for care (pt-stated)        Current Barriers:  . Financial constraints  . Dental Issues  Clinical Social Work Clinical Goal(s):  [name] Over the next 30 days, client will work with SW to address concerns related to dental needs of client  Interventions: . Provided patient with information about CCM program services  . LCSW encouraged client to talk with RNCM to address nursing needs of client. . Previously provided client with information regarding Dr. Ara Kussmaul for patient to contact to schedule client dental appointment . Encouraged client to call Dr. Rexene Agent, to schedule client dental appointment . Talked with client about current client needs  Patient Self Care Activities:  . Self administers medications as prescribed . Attends all scheduled provider appointments . Performs ADL's independently  Plan:   Client to call LCSW as needed to discuss psychosocial needs of client LCSW to call client in next 3 weeks to discuss current client needs including dental needs of client Client to communicate with RN CM to discuss nursing needs of client.  Client to call dentist, Dr. Costella Hatcher , at [phone], to schedule client dental appointment Client to attend medical appointments as scheduled  Initial goal documentation     Materials Provided: No  Follow Up Plan: LCSW to call client in next 3 weeks to discuss current client needs including dental needs of client  The patient verbalized understanding of instructions provided today and declined a print copy of patient instruction materials.   Norva Riffle.Diana Armijo MSW, LCSW Licensed Clinical Social Worker [facility] [phone]

## 2019-01-07 NOTE — Chronic Care Management (AMB) (Signed)
  Care Management Note   Dustin Cruz is a 53 y.o. year old male who is a primary care patient of Dustin Norlander, DO. The CM team was consulted for assistance with chronic disease management and care coordination.   I reached out to [place] by phone today.  Review of patient status, including review of consultants reports, relevant laboratory and other test results, and collaboration with appropriate care team members and the patient's provider was performed as part of comprehensive patient evaluation and provision of chronic care management services.   GAD 7 : Generalized Anxiety Score 11/22/2018  Nervous, Anxious, on Edge 0  Control/stop worrying 0  Worry too much - different things 0  Trouble relaxing 0  Restless 0  Easily annoyed or irritable 0  Afraid - awful might happen 0  Total GAD 7 Score 0  Anxiety Difficulty Not difficult at all     Office Visit from 01/06/2019 in [place]  PHQ-9 Total Score  0     Social Determinants of Health: Risk for food insecurity; risk for tobacco exposure; risk for Stress.  Goals    . Client said he had dental care issues and needs to see dentist for care (pt-stated)     Barriers:  Dental pain issues Financial Challenges  Clinical Social Work Goal: Over next 30 days , client  will work with LCSW to address concerns related to dental care needs of client  Interventions: LCSW referred client to RN CM for nursing needs Provided patient with information regarding dentist, Dr. Costella Hatcher, [phone], for patient to contact to schedule client dental appointment Patient interviewed related to psychosocial needs of client . Encouraged patient to call Dr. Costella Hatcher to schedule client dental appointment.   Patient Self Care Activities: Self administers medications as prescribed Attends all scheduled provider appointments Performs ADL's independently  Plan: Client to call LCSW as needed to discuss  psychosocial needs of client LCSW to call client in next 3 weeks to discuss current client needs including dental needs of client Client to communicate with RN CM to discuss nursing needs of client.  Client to call dentist, Dr. Costella Hatcher , at [phone], to schedule client dental appointment Client to attend medical appointments as scheduled  Initial goal documentation     Follow Up Plan: LCSW to call client in 3 weeks to assess psychosocial needs of client and to talk with client about dental needs of client.   Norva Riffle.[name] MSW, LCSW Licensed Clinical Social Worker [facility] [phone]

## 2019-01-08 ENCOUNTER — Other Ambulatory Visit: Payer: Self-pay | Admitting: Family Medicine

## 2019-01-08 DIAGNOSIS — E785 Hyperlipidemia, unspecified: Secondary | ICD-10-CM

## 2019-01-08 NOTE — Telephone Encounter (Signed)
Last lipid 01/15/17

## 2019-01-10 ENCOUNTER — Other Ambulatory Visit: Payer: Self-pay | Admitting: Family Medicine

## 2019-01-10 DIAGNOSIS — E785 Hyperlipidemia, unspecified: Secondary | ICD-10-CM

## 2019-01-10 NOTE — Telephone Encounter (Signed)
This was just filled 01/08/2019

## 2019-01-14 ENCOUNTER — Other Ambulatory Visit: Payer: Self-pay

## 2019-01-14 ENCOUNTER — Ambulatory Visit: Payer: Medicare Other | Admitting: *Deleted

## 2019-01-14 ENCOUNTER — Other Ambulatory Visit: Payer: Self-pay | Admitting: Family Medicine

## 2019-01-14 DIAGNOSIS — R059 Cough, unspecified: Secondary | ICD-10-CM

## 2019-01-14 DIAGNOSIS — R05 Cough: Secondary | ICD-10-CM

## 2019-01-14 DIAGNOSIS — R200 Anesthesia of skin: Secondary | ICD-10-CM

## 2019-01-14 DIAGNOSIS — R29898 Other symptoms and signs involving the musculoskeletal system: Secondary | ICD-10-CM

## 2019-01-16 MED ORDER — ALBUTEROL SULFATE HFA 108 (90 BASE) MCG/ACT IN AERS: 2.0000 | INHALATION_SPRAY | Freq: Four times a day (QID) | RESPIRATORY_TRACT | 2 refills | Status: DC | PRN

## 2019-01-16 NOTE — Patient Instructions (Signed)
Visit Information  Goals Addressed            This Visit's Progress   . COMPLETED: "I need help with this pain in my mouth" (pt-stated)       Current Barriers:  [name] Knowledge Deficits related to acute and long term management of dental health including pain management . Film/video editor. - LCSW is actively working with patient in an effort to provide support and resources related to financial constraints preventing proper dental care  Dustin Cruz was evaluated at the ED on 11/17/18. The following was noted in the physical exam:   "Dentition: Abnormal dentition(Loose bottom left incisor. No surrounding erythema or signs of infection.).Gingival swellingand dental cariespresent. No dental tendernessor dental abscesses."  The following was noted in the ED discharge plan:  "53 year old male presents with a spot on left incisor for 1 month. He is requesting that I pulled his tooth tonight. Patient is mildly hypertensive but otherwise vital signs are normal. Advised him that we do not pull teeth. Not see any signs of infection on exam therefore will forego any antibiotics. He was given dental resources."  Patient has not followed up with a dentist and his primary concern at this time is pain management until he is able to see a dentist.  Nurse Case Manager Clinical Goal(s):  [name] Over the next 30 days, patient will verbalize basic understanding of dental disease process and self health management plan as evidenced by participation in development of plan of care and verbalization of understanding of long term plan for management of dental health . Over the next 30 days, patient will demonstrate improved health management independence as evidenced by outreach to primary care provider office or dentist office for management of dental care needs and pain management  Interventions:  . Evaluation of current treatment plan related to dental health concerns and patient's adherence to plan as  established by provider.  Reviewed medications with patient and discussed safe use of over the counter analgesics -discussed his OTC pain control options. He is unable to take NSAIDS due to stage 3 CKD and I advised him to continue to avoid those.  Recommended 2 extra strength Tylenol which he reported had not helped previously. I also recommended an OTC topical pain reliever like Anbesol or Orajel. I also advised him to read the directions carefully and cautioned him against overuse.   [name] with LCSW regarding financial barriers preventing appropriate dental care . Provided telephone numbers of local dentists that accept adult Medicaid   Patient Self Care Activities:  . Self administers medications as prescribed . Performs IADL's independently . Performs ADL's independently   Plan:  . Patient will adhere to prescribed pain management regimen . Patient provided telephone number for [facility] in [facility] [phone]. He was unable to reach Dr Kermit Balo in [place] but will call [facility] this evening to schedule an appointment tomorrow  . RNCM will follow up with patient by phone over the next 24-48 hours to assess patient progress, pain management, and ongoing plans for dental care   Please see past updates related to this goal by clicking on the "Past Updates" button in the selected goal   01/16/19 Patient was provided with several resources but failed to follow up with them. He reports that his tooth is loose but no longer hurts and is not infected. He will follow up as needed. I encouraged him to still follow up with a dentist.     .  COMPLETED: "I need help with this pain/tingling in my shoulder and arm" (pt-stated)       Current Barriers:  [name] Knowledge Deficits related to appropriate ways to manage pain . Knowledge Deficits related to cause of pain/tingling . Film/video editor.   . Non adherence to recommended medication regimen   Nurse Case Manager Clinical  Goal(s):  [name] Over the next 7 days, patient will demonstrate improved adherence to prescribed treatment plan for neck/shoulder/arm pain/tingling as evidenced by an improvment or resolution in pain. . Over the next 7 days, patient will demonstrate improved health management independence as evidenced byability to explain the association between recurrent shoulder/arm pain and activities that exacerbate the pain.   Interventions:  . Advised patient to call me to schedule an appointment tomorrow if the pain worsens in the morning as it has the past two days  . Advised patient that an appointment with his PCP is needed if pain worsens or persists . Advised patient to apply ice/heat, whichever feels better, to affected area for 20 minutes at a time 3 to 5 times a day . Advised patient to take extra strength Tylenol for pain  . Advised patient to use muscle rubs or patches, like Salanpas, over the affected area as directed on packaging . Questioned patient about his current activities when the pain started and any associated symptoms  Patient Self Care Activities:  . Performs ADL's independently . Performs IADL's independently . Calls provider office for new concerns or questions  Plan:  . Patient will call me in the morning with current pain status . RNCM will reach out to patient by phone over the next 24 to 48 hours if he does not call me  *initial goal documentation      . "I want to find out why my left leg/foot is weak and numb" (pt-stated)       Current Barriers:  [name] Knowledge Deficits related to cause of and treatment for left leg weakness/numbness. . Film/video editor.  . Transportation barriers . Non-adherence to scheduled provider appointments   Nurse Case Manager Clinical Goal(s):  [name] Over the next 7 days, patient will verbalize understanding of plan for assessing and treatment left leg/foot numbness and weakness. . Over the next 30 days, patient will attend all scheduled  medical appointments: Referral to orthopedist  Interventions:  . Evaluation of current treatment plan related to left leg/foot numbness and weakness. and patient's adherence to plan as established by provider. . Advised patient to keep orthopedic appointment when it is rescheduled for [place] . Discussed plans with patient for ongoing care management follow up and provided patient with direct contact information for care management team  . Recent office note and lumbar imaging report reviewed.  . Discussed with patient his reason for cancelling ortho appt was due to location. Requests [place].  . Will reorder referral with [place] as preferred location  Patient Self Care Activities:  . Self administers medications as prescribed . Calls pharmacy for medication refills . Performs ADL's independently  Initial goal documentation       . Improved knowledge of DM disease management needs       Current Barriers:  [name] Knowledge Deficits related to DM disease process and self health management plan . Film/video editor.   Nurse Case Manager Clinical Goal(s):  [name] Over the next 60 days, patient will verbalize basic understanding of DM disease process and self health management plan as evidenced by participation in development of long term plan for self  health management of DM  . Over the next 60 days, patient will continue to check and record blood sugar readings and report any readings outside of provider recommended range  Interventions:  . Evaluation of current treatment plan related to DM and patient's adherence to plan as established by provider. . Provided education to patient re: diabetes care as related to current dental health concerns   Patient Self Care Activities:  . Self administers medications as prescribed . Performs ADL's independently . Performs IADL's independently  Please see past updates related to this goal by clicking on the "Past Updates" button in the  selected goal      . Improved knowledge of self care activities related to CKD Stage 3       Current Barriers:  [name] Knowledge Deficits related to CKD disease process and self health management plan* . Film/video editor.   .  Nurse Case Manager Clinical Goal(s):  [name] Over the next 60 days, patient will verbalize basic understanding of CKD disease process and self health management plan as evidenced by participation in establishment of long term plan of care  . Over the next 60 days, patient will keep all scheduled medical appointments   Interventions:  . Evaluation of current treatment plan related to CKD Stage 3 and patient's adherence to plan as established by provider. . Reviewed medications with patient and discussed safe use of over the counter analgesics for patient with CKD Stage 3   Patient Self Care Activities:  . Self administers medications as prescribed . Performs ADL's independently . Performs IADL's independently  Please see past updates related to this goal by clicking on the "Past Updates" button in the selected goal         The patient verbalized understanding of instructions provided today and declined a print copy of patient instruction materials.   The CM team will reach out to the patient again over the next 7 days.    [name], RN-BC, BSN Nurse Case Manager [place] [phone]

## 2019-01-16 NOTE — Progress Notes (Signed)
Chronic Care Management   Follow Up Note   01/15/2019 Name: ZEPLIN ALESHIRE MRN: [id number] DOB: 05-Jul-1966  Referred by: [name], DO Reason for referral : Chronic Care Management (RNCM telephone f/u)   TARYLL REICHENBERGER is a 53 y.o. year old male who is a primary care patient of [name], DO. The CCM team was consulted for assistance with chronic disease management and care coordination needs.    Review of patient status, including review of consultants reports, relevant laboratory and other test results, and collaboration with appropriate care team members and the patient's provider was performed as part of comprehensive patient evaluation and provision of chronic care management services.    I spoke with patient via telephone and the main focus was on his left leg numbness/weakness. He was seen my Dr Lajuana Ripple on 01/06/19 and referred to ortho in [place]. Patient prefers [place]. Will reorder for [place] location.   Goals Addressed    . "I want to find out why my left leg/foot is weak and numb" (pt-stated)       Current Barriers:  [name] Knowledge Deficits related to cause of and treatment for left leg weakness/numbness. . Film/video editor.  . Transportation barriers . Non-adherence to scheduled provider appointments   Nurse Case Manager Clinical Goal(s):  [name] Over the next 7 days, patient will verbalize understanding of plan for assessing and treatment left leg/foot numbness and weakness. . Over the next 30 days, patient will attend all scheduled medical appointments: Referral to orthopedist  Interventions:  . Evaluation of current treatment plan related to left leg/foot numbness and weakness. and patient's adherence to plan as established by provider. . Advised patient to keep orthopedic appointment when it is rescheduled for [place] . Discussed plans with patient for ongoing care management follow up and provided patient with direct contact information for  care management team  . Recent office note and lumbar imaging report reviewed.  . Discussed with patient his reason for cancelling ortho appt was due to location. Requests [place].  . Will reorder referral with [place] as preferred location  Patient Self Care Activities:  . Self administers medications as prescribed . Calls pharmacy for medication refills . Performs ADL's independently      . Improved knowledge of DM disease management needs       Current Barriers:  [name] Knowledge Deficits related to DM disease process and self health management plan . Film/video editor.   Nurse Case Manager Clinical Goal(s):  [name] Over the next 60 days, patient will verbalize basic understanding of DM disease process and self health management plan as evidenced by participation in development of long term plan for self health management of DM  . Over the next 60 days, patient will continue to check and record blood sugar readings and report any readings outside of provider recommended range  Interventions:  . Evaluation of current treatment plan related to DM and patient's adherence to plan as established by provider. . Provided education to patient re: diabetes care as related to current dental health concerns   Patient Self Care Activities:  . Self administers medications as prescribed . Performs ADL's independently . Performs IADL's independently     . Improved knowledge of self care activities related to CKD Stage 3       Current Barriers:  [name] Knowledge Deficits related to CKD disease process and self health management plan* . Film/video editor.   .  Nurse Case Manager Clinical Goal(s):  [name] Over the  next 60 days, patient will verbalize basic understanding of CKD disease process and self health management plan as evidenced by participation in establishment of long term plan of care  . Over the next 60 days, patient will keep all scheduled medical appointments   Interventions:  .  Evaluation of current treatment plan related to CKD Stage 3 and patient's adherence to plan as established by provider. . Reviewed medications with patient and discussed safe use of over the counter analgesics for patient with CKD Stage 3   Patient Self Care Activities:  . Self administers medications as prescribed . Performs ADL's independently . Performs IADL's independently       Orders Placed This Encounter  Procedures  . Ambulatory referral to Orthopedic Surgery    Referral Priority:   Routine    Referral Type:   Surgical    Referral Reason:   Specialty Services Required    Requested Specialty:   Orthopedic Surgery    Number of Visits Requested:   1    Follow up Plan The CM team will reach out to the patient again over the next 7 days.    [name], RN-BC, BSN Nurse Case Manager [place] [phone]

## 2019-01-28 ENCOUNTER — Ambulatory Visit: Payer: Self-pay | Admitting: *Deleted

## 2019-01-28 ENCOUNTER — Ambulatory Visit (INDEPENDENT_AMBULATORY_CARE_PROVIDER_SITE_OTHER): Payer: Medicare Other | Admitting: Licensed Clinical Social Worker

## 2019-01-28 DIAGNOSIS — I10 Essential (primary) hypertension: Secondary | ICD-10-CM | POA: Diagnosis not present

## 2019-01-28 DIAGNOSIS — F32A Depression, unspecified: Secondary | ICD-10-CM

## 2019-01-28 DIAGNOSIS — Z794 Long term (current) use of insulin: Principal | ICD-10-CM

## 2019-01-28 DIAGNOSIS — E1159 Type 2 diabetes mellitus with other circulatory complications: Secondary | ICD-10-CM

## 2019-01-28 DIAGNOSIS — E1142 Type 2 diabetes mellitus with diabetic polyneuropathy: Secondary | ICD-10-CM

## 2019-01-28 DIAGNOSIS — G63 Polyneuropathy in diseases classified elsewhere: Secondary | ICD-10-CM

## 2019-01-28 DIAGNOSIS — E1122 Type 2 diabetes mellitus with diabetic chronic kidney disease: Secondary | ICD-10-CM

## 2019-01-28 DIAGNOSIS — N183 Chronic kidney disease, stage 3 (moderate): Secondary | ICD-10-CM

## 2019-01-28 DIAGNOSIS — K089 Disorder of teeth and supporting structures, unspecified: Secondary | ICD-10-CM

## 2019-01-28 DIAGNOSIS — J441 Chronic obstructive pulmonary disease with (acute) exacerbation: Secondary | ICD-10-CM

## 2019-01-28 DIAGNOSIS — F329 Major depressive disorder, single episode, unspecified: Secondary | ICD-10-CM

## 2019-01-28 DIAGNOSIS — Z720 Tobacco use: Secondary | ICD-10-CM

## 2019-01-28 NOTE — Chronic Care Management (AMB) (Signed)
Chronic Care Management   RN Case Management Follow Up Note   01/28/2019 Name: Dustin Cruz MRN: [id number] DOB: 09-29-1966  Referred by: [name], DO Reason for referral : Chronic Care Management (RNCM follow up on neuropathy and dysuria)   Dustin Cruz is a 53 yDustino. year old male who is a primary care patient of [name], DO. The CCM team was consulted for assistance with chronic disease management and care coordination needs.    I spoke with Dustin Cruz by telephone today to discuss his acute complaint of dysuria during his earlier telephone call with Dustin Halsted" Forrest, LCSW. Chronic care needs were also follow up on.   Review of patient status, including review of consultants reports, relevant laboratory and other test results, and collaboration with appropriate care team members and the patient's provider was performed as part of comprehensive patient evaluation and provision of chronic care management services.    Goals Addressed      Patient Stated   . "I have some burning when I pee and sometimes I have a yellow drainage" (pt-stated)       Patient complains of dysuria x 1 month. He relayed to Dustin Cruz during their earlier telephone conversation that he was recently scheduled for an adult circumcision surgical consult due to complications with foreskin. Dustin Cruz questioned if this could be related to his urinary discomfort. Dustin Cruz states that he has some burning with urination. When questioned about penile discharge, he did report having some episodes of yellow discharge. He seemed hesitant to answer open ended questions so I did not review his sexual health history over the phone. He is scheduled for an appointment with Dr Lajuana Ripple for tomorrow.    Current Barriers:  Dustin Cruz Kitchen Knowledge Deficits related to cause and treatment of dysuria and discharge.  Nurse Case Manager Clinical Goal(s):  Dustin Cruz Kitchen Over the next 7 days, patient will verbalize understanding of plan for  treatment of dysuria and discharge. . Over the next 3 days, patient will attend all scheduled medical appointments: Dr Lajuana Ripple 01/29/2019  Interventions:  . Advised patient to come in for exam and urine specimen on 01/29/2019. Dustin Cruz Kitchen Collaborated with LCSW regarding reported symptoms during their telephone call earlier today.  . Discussed plans with patient for ongoing care management follow up and provided patient with direct contact information for care management team  Patient Self Care Activities:  . Self administers medications as prescribed . Attends all scheduled provider appointments . Calls pharmacy for medication refills . Performs ADL's independently . Performs IADL's independently . Calls provider office for new concerns or questions      . "I want to find out why my left leg/foot is weak and numb" (pt-stated)       Scheduled with Dr Aline Brochure in [place] but I don't think he kept the appointment. I will need to find out. He has taken Neurontin 800mg  TID in the past and has mentioned to me on several occasions that he would like to go back to that dose because it worked better than 600mg  TID.   Current Barriers:  Dustin Cruz Kitchen Knowledge Deficits related to cause of and treatment for left leg weakness/numbness. . Film/video editor.  . Transportation barriers . Non-adherence to scheduled provider appointments   Nurse Case Manager Clinical Goal(s):  Dustin Cruz Kitchen Over the next 30 days, patient will work with RN Case manager to address needs related to foot numbness/pain. . Over the next 30 days, patient will attend all scheduled medical appointments: 1.  Referral to Dr Aline Brochure in [place], 2. Dr Lajuana Ripple: 01/29/2019  Interventions:  . Evaluation of current treatment plan related to left leg/foot numbness and weakness. and patient's adherence to plan as established by provider. . Advised patient of referral to Dr Aline Brochure in [place] . Discussed plans with patient for ongoing care management  follow up and provided patient with direct contact information for care management team  . Reviewed medications and discussed Neurontin 600mg . Patient would like to talk with PCP about increasing back to 800mg  TID.  Dustin Cruz Kitchen Appt with PCP scheduled for 01/29/2019  Patient Self Care Activities:  . Self administers medications as prescribed . Calls pharmacy for medication refills . Performs ADL's independently           . Improved knowledge of DM disease management needs       Current Barriers:  Dustin Cruz Kitchen Knowledge Deficits related to DM disease process and self health management plan . Film/video editor.   Nurse Case Manager Clinical Goal(s):  Dustin Cruz Kitchen Over the next 60 days, patient will verbalize basic understanding of DM disease process and self health management plan as evidenced by participation in development of long term plan for self health management of DM  . Over the next 60 days, patient will continue to check and record blood sugar readings and report any readings outside of provider recommended range  Interventions:  . Evaluation of current treatment plan related to DM and patient's adherence to plan as established by provider. . Provided education to patient re: diabetes care as related to current dental health concerns  . Again recommended scheduling an appointment with a dental provider  . Decrease intake of sugary drinks  Patient Self Care Activities:  . Self administers medications as prescribed . Performs ADL's independently . Performs IADL's independently     . Improved knowledge of self care activities related to CKD Stage 3       Current Barriers:  Dustin Cruz Kitchen Knowledge Deficits related to CKD disease process and self health management plan* . Film/video editor.   .  Nurse Case Manager Clinical Goal(s):  Dustin Cruz Kitchen Over the next 60 days, patient will verbalize basic understanding of CKD disease process and self health management plan as evidenced by participation in establishment of long  term plan of care  . Over the next 60 days, patient will keep all scheduled medical appointments   Interventions:  . Evaluation of current treatment plan related to CKD Stage 3 and patient's adherence to plan as established by provider. . Reviewed medications with patient and discussed safe use of over the counter analgesics for patient with CKD Stage 3  . Recommended to decrease soda intake. Drink water instead and follow provider recommended fluid restrictions.    Patient Self Care Activities:  . Self administers medications as prescribed . Performs ADL's independently . Performs IADL's independently        Follow Up Plan The CM team will reach out to the patient again over the next 30 days.   [name], RN-BC, BSN Nurse Case Manager [place] [phone]

## 2019-01-28 NOTE — Patient Instructions (Signed)
Licensed Clinical Social Worker Visit Information  Goals we discussed today:  Goals Addressed            This Visit's Progress   . Client said he had dental care issues and needs to see dentist for care (pt-stated)       Barriers:  Dental pain issues Financial Challenges  Clinical Social Work Goal: Over next 30 days , client  will work with LCSW to address concerns related to dental care needs of client  Interventions: LCSW encouraged client to communicate with RNCM regarding nursing needs of client Previously provided patient with information regarding dentist, Dr. Costella Hatcher, [phone], for patient to contact to schedul eclient dental appointment Encouraged patient to call Dr. Costella Hatcher to schedule client dental appointment.  Talked with client about current dental needs of client  Patient Self Care Activities: Self administers medications as prescribed Attends all scheduled provider appointments Performs ADL's independently  Plan: Client to call LCSW as needed to discuss psychosocial needs of client LCSW to call client in next 3 weeks to discuss current client needs including dental needs of client Client to communicate with RN CM to discuss nursing needs of client.  Client to call dentist, Dr. Costella Hatcher , at [phone], to schedule client dental appointment      Materials Provided: No  Follow Up Plan: LCSW to call client in next 3 weeks to discuss current client needs including dental needs of client  The patient verbalized understanding of instructions provided today and declined a print copy of patient instruction materials.   Dustin Cruz.Dustin Cruz MSW, LCSW Licensed Clinical Social Worker [facility] Care Management [phone]

## 2019-01-28 NOTE — Chronic Care Management (AMB) (Signed)
  Care Management Note   Dustin Cruz is a 53 y.o. year old male who is a primary care patient of [name], DO. The CM team was consulted for assistance with chronic disease management and care coordination.   I reached out to Verlee Rossetti, significant other, caregiver for client, by phone today.   Review of patient status, including review of consultants reports, relevant laboratory and other test results, and collaboration with appropriate care team members and the patient's provider was performed as part of comprehensive patient evaluation and provision of chronic care management services.   Social Determinants of Health: Risk of tobacco use; risk for Stress; Risk for food insecurity  GAD 7 : Generalized Anxiety Score 11/22/2018  Nervous, Anxious, on Edge 0  Control/stop worrying 0  Worry too much - different things 0  Trouble relaxing 0  Restless 0  Easily annoyed or irritable 0  Afraid - awful might happen 0  Total GAD 7 Score 0  Anxiety Difficulty Not difficult at all    Goals Addressed            This Visit's Progress   . Client said he had dental care issues and needs to see dentist for care (pt-stated)       Barriers:  Dental pain issues Financial Challenges  Clinical Social Work Goal: Over next 30 days , client  will work with LCSW to address concerns related to dental care needs of client  Interventions: LCSW encouraged client to communicate with RNCM regarding nursing needs of client Previously provided patient with information regarding dentist, Dr. Costella Hatcher, [phone], for patient to contact to schedule client dental appointment Encouraged patient to call Dr. Costella Hatcher to schedule client dental appointment.  Talked with client about current dental needs of client  Patient Self Care Activities: Self administers medications as prescribed Attends all scheduled provider appointments Performs ADL's independently  Plan:  Client to call LCSW as needed to discuss psychosocial needs of client LCSW to call client in next 3 weeks to discuss current client needs including dental needs of client Client to communicate with RN CM to discuss nursing needs of client.  Client to call dentist, Dr. Costella Hatcher , at [phone], to schedule client dental appointment     Follow Up Plan: LCSW to call client in next 3 weeks to discuss current client needs including dental needs of client.  Norva Riffle.Dejae Bernet MSW, LCSW Licensed Clinical Social Worker [facility] [phone]

## 2019-01-28 NOTE — Patient Instructions (Signed)
Visit Information  Goals Addressed      Patient Stated   . "I have some burning when I pee and sometimes I have a yellow drainage" (pt-stated)       Current Barriers:  [name] Knowledge Deficits related to cause and treatment of dysuria and discharge.  Nurse Case Manager Clinical Goal(s):  [name] Over the next 7 days, patient will verbalize understanding of plan for treatment of dysuria and discharge. . Over the next 3 days, patient will attend all scheduled medical appointments: Dr Lajuana Ripple 01/29/2019  Interventions:  . Advised patient to come in for exam and urine specimen on 01/29/2019. [name] Collaborated with LCSW regarding reported symptoms during their telephone call earlier today.  . Discussed plans with patient for ongoing care management follow up and provided patient with direct contact information for care management team  Patient Self Care Activities:  . Self administers medications as prescribed . Attends all scheduled provider appointments . Calls pharmacy for medication refills . Performs ADL's independently . Performs IADL's independently . Calls provider office for new concerns or questions  Initial goal documentation      . "I want to find out why my left leg/foot is weak and numb" (pt-stated)       Current Barriers:  [name] Knowledge Deficits related to cause of and treatment for left leg weakness/numbness. . Film/video editor.  . Transportation barriers . Non-adherence to scheduled provider appointments   Nurse Case Manager Clinical Goal(s):  [name] Over the next 30 days, patient will work with RN Case manager to address needs related to foot numbness/pain. . Over the next 30 days, patient will attend all scheduled medical appointments: 1. Referral to Dr Aline Brochure in [place], 2. Dr Lajuana Ripple: 01/29/2019  Interventions:  . Evaluation of current treatment plan related to left leg/foot numbness and weakness. and patient's adherence to plan as established by  provider. . Advised patient of referral to Dr Aline Brochure in [place] . Discussed plans with patient for ongoing care management follow up and provided patient with direct contact information for care management team  . Reviewed medications and discussed Neurontin 600mg . Patient would like to talk with PCP about increasing back to 800mg  TID.  [name] Appt with PCP scheduled for 01/29/2019  Patient Self Care Activities:  . Self administers medications as prescribed . Calls pharmacy for medication refills . Performs ADL's independently  Please see past updates related to this goal by clicking on the "Past Updates" button in the selected goal          Other   . Improved knowledge of DM disease management needs       Current Barriers:  [name] Knowledge Deficits related to DM disease process and self health management plan . Film/video editor.   Nurse Case Manager Clinical Goal(s):  [name] Over the next 60 days, patient will verbalize basic understanding of DM disease process and self health management plan as evidenced by participation in development of long term plan for self health management of DM  . Over the next 60 days, patient will continue to check and record blood sugar readings and report any readings outside of provider recommended range  Interventions:  . Evaluation of current treatment plan related to DM and patient's adherence to plan as established by provider. . Provided education to patient re: diabetes care as related to current dental health concerns  . Again recommended scheduling an appointment with a dental provider  . Decrease intake of sugary drinks  Patient Self Care Activities:  .  Self administers medications as prescribed . Performs ADL's independently . Performs IADL's independently  Please see past updates related to this goal by clicking on the "Past Updates" button in the selected goal      . Improved knowledge of self care activities related to CKD Stage 3        Current Barriers:  [name] Knowledge Deficits related to CKD disease process and self health management plan* . Film/video editor.   .  Nurse Case Manager Clinical Goal(s):  [name] Over the next 60 days, patient will verbalize basic understanding of CKD disease process and self health management plan as evidenced by participation in establishment of long term plan of care  . Over the next 60 days, patient will keep all scheduled medical appointments   Interventions:  . Evaluation of current treatment plan related to CKD Stage 3 and patient's adherence to plan as established by provider. . Reviewed medications with patient and discussed safe use of over the counter analgesics for patient with CKD Stage 3  . Recommended to decrease soda intake. Drink water instead and follow provider recommended fluid restrictions.    Patient Self Care Activities:  . Self administers medications as prescribed . Performs ADL's independently . Performs IADL's independently  Please see past updates related to this goal by clicking on the "Past Updates" button in the selected goal         The patient verbalized understanding of instructions provided today and declined a print copy of patient instruction materials.   The CM team will reach out to the patient again over the next 30 days.    [name], RN-BC, BSN Nurse Case Manager [place] [phone]

## 2019-01-29 ENCOUNTER — Ambulatory Visit (INDEPENDENT_AMBULATORY_CARE_PROVIDER_SITE_OTHER): Payer: Medicare Other | Admitting: Family Medicine

## 2019-01-29 ENCOUNTER — Encounter: Payer: Self-pay | Admitting: Family Medicine

## 2019-01-29 ENCOUNTER — Other Ambulatory Visit: Payer: Self-pay

## 2019-01-29 VITALS — BP 131/75 | HR 82 | Temp 96.9°F | Ht 74.0 in | Wt 198.0 lb

## 2019-01-29 DIAGNOSIS — R369 Urethral discharge, unspecified: Secondary | ICD-10-CM

## 2019-01-29 DIAGNOSIS — R300 Dysuria: Secondary | ICD-10-CM | POA: Diagnosis not present

## 2019-01-29 DIAGNOSIS — M150 Primary generalized (osteo)arthritis: Secondary | ICD-10-CM

## 2019-01-29 DIAGNOSIS — N3000 Acute cystitis without hematuria: Secondary | ICD-10-CM | POA: Diagnosis not present

## 2019-01-29 DIAGNOSIS — M159 Polyosteoarthritis, unspecified: Secondary | ICD-10-CM

## 2019-01-29 DIAGNOSIS — N472 Paraphimosis: Secondary | ICD-10-CM | POA: Diagnosis not present

## 2019-01-29 LAB — URINALYSIS, COMPLETE
Bilirubin, UA: NEGATIVE
Ketones, UA: NEGATIVE
Nitrite, UA: NEGATIVE
Specific Gravity, UA: 1.025 (ref 1.005–1.030)
Urobilinogen, Ur: 0.2 mg/dL (ref 0.2–1.0)
pH, UA: 5.5 (ref 5.0–7.5)

## 2019-01-29 LAB — MICROSCOPIC EXAMINATION: WBC, UA: >30 /hpf — ABNORMAL HIGH (ref 0–5)

## 2019-01-29 MED ORDER — DICLOFENAC SODIUM 1 % TD GEL: 4.0000 g | Freq: Four times a day (QID) | TRANSDERMAL | 0 refills | Status: DC

## 2019-01-29 MED ORDER — CEPHALEXIN 500 MG PO CAPS: 500.0000 mg | ORAL_CAPSULE | Freq: Two times a day (BID) | ORAL | 0 refills | Status: AC

## 2019-01-29 MED ORDER — TRIAMCINOLONE ACETONIDE 0.025 % EX CREA: 1.0000 "application " | TOPICAL_CREAM | Freq: Two times a day (BID) | CUTANEOUS | 0 refills | Status: DC

## 2019-01-29 NOTE — Patient Instructions (Addendum)
I have placed a new referral to the specialist for circumcision.   I have ordered a cream for you to apply to the painful areas of your penis twice daily for 1 week I have ordered an antibiotic to cover for urine infection. We tested you for STDs today as well.  Paraphimosis Paraphimosis is a serious condition that happens when the fold of skin that stretches over the tip of the penis (foreskin) becomes stuck when it is pulled back. This condition blocks blood from flowing away from the penis tip, and that causes swelling that gets worse and worse. Paraphimosis needs to be treated right away. What are the causes? This condition may be caused by:  Leaving the foreskin pulled back after a procedure, such as after the placement of a catheter.  A foreskin that is tighter than normal.  A foreskin that has been pulled back for too long.  A forceful pulling back of the foreskin.  Infection under the foreskin.  Trauma to the area, such as a hard hit to the tip of the penis.  Having sex or masturbating.  Hair or clothing that gets wrapped around the penis. What increases the risk? You are more likely to develop this condition if you:  Have not had all of your foreskin removed.  Have had frequent urological procedures.  Have poor hygiene.  Need help with daily hygiene from a caregiver. What are the signs or symptoms? Symptoms of this condition include:  A foreskin that cannot be returned to its normal position.  Pain, often at the tip of the penis.  Swelling of the penis.  Trouble urinating.  Skin that is red or bluish at the tip of the penis. How is this diagnosed? This condition may be diagnosed based on your symptoms and a physical exam. How is this treated? This condition may be treated with:  A procedure in which your health care provider manually moves the foreskin back into position over the tip of the penis. Swelling may first be reduced by: ? Applying ice to the  area. ? Wrapping a bandage tightly around the penis. ? Using needles to drain any pus or blood that is causing the swelling. ? Applying a gauze soaked with certain medicines or solutions. ? Applying granulated sugar to the area. ? Injecting medicine into the foreskin to reduce swelling.  Medicine to relieve pain. Medicine may be given by mouth, through an IV, or by an injection to the base of the penis (nerve block).  A procedure in which a small incision is made in the tightened foreskin to free it and allow it to be pulled back into place (dorsal slit procedure).  Surgery to remove the foreskin (circumcision). This may be done if the foreskin cannot be moved back into place. Most of the time, treatment can be done in a clinic or a health care provider's office. Follow these instructions at home: Lifestyle  Follow instructions from your health care provider about avoiding sexual activity.  Wear loose undergarments to avoid applying pressure to the area. General instructions  Take over-the-counter and prescription medicines only as told by your health care provider.  Apply any creams to the affected area only as told by your health care provider.  Follow instructions from your health care provider about how to take care of your wound. Make sure you: ? Wash your hands with soap and water before and after you change your bandage (dressing) if you were given one. If soap and water  are not available, use hand sanitizer. ? Ask when you should remove your dressing. ? Ask whether the area can get wet.  Keep all follow-up visits as told by your health care provider. This is important. Contact a health care provider if:  The treated area of skin does not heal or it becomes more irritated, red, or bloody.  Urination is difficult or painful.  Pain in the penis continues, even after you take medicine for pain.  You develop a fever. Summary  Paraphimosis is a serious condition that  happens when the fold of skin that stretches over the tip of the penis (foreskin) becomes stuck when it is pulled back. Paraphimosis needs to be treated right away.  Take over-the-counter and prescription medicines only as told by your health care provider.  Apply any creams to the affected area only as told by your health care provider.  Contact a health care provider if urination is difficult or painful, or if pain in the penis continues even after you take medicine for pain.  Keep all follow-up visits as told by your health care provider. This is important. This information is not intended to replace advice given to you by your health care provider. Make sure you discuss any questions you have with your health care provider. Document Released: 07/23/2009 Document Revised: 03/06/2018 Document Reviewed: 03/06/2018 Elsevier Interactive Patient Education  2019 [employer].

## 2019-01-29 NOTE — Progress Notes (Signed)
Subjective: CC: dysuria/ urethral discharge PCP: [name], DO Dustin Cruz is a 53 y.o. male presenting to clinic today for:  1. Dysuria/ urethral discharge Patient reports a 1 month history of burning on the outside of his penis when he urinates and yellow/milky discharge from the urethra.  He denies any hematuria, abdominal pain, nausea, vomiting, fevers, new back pain.  He has not had intercourse in greater than 1 year.  He was diagnosed with paraphimosis a year ago during which time he had very similar symptoms.  He was referred to urologist for consideration of circumcision at that time but he states that he missed the appointment and never rescheduled.  2.  Osteoarthritis/neuropathy Patient reports ongoing lower extremity pain and neuropathy.  He notes that symptoms were controlled with the increased dose of Neurontin but this was reduced to 600 mg 3 times daily for impaired renal function.  He is wondering if there is anything else he can do, as he is using over-the-counter BenGay with little improvement in symptoms.  ROS: Per HPI  No Known Allergies Past Medical History:  Diagnosis Date  . Anxiety   . Asthma   . CKD stage 3 due to type 2 diabetes mellitus ([place])   . COPD (chronic obstructive pulmonary disease) ([place])   . Coronary artery disease    s/p BMS to Ramus 10/2010;  Cath 01/22/12 prox 30-40% LAD, LCx ramus w/ hazy 70-80% in-stent restenosis, EF 60% treated medically  . Coronary artery disease    s/p BMS to [facility] 10/2010;  Cath 01/22/12 prox 30-40% LAD, LCx ramus w/ hazy 70-80% in-stent restenosis, EF 60% treated medically   . Depression   . Diabetes mellitus    Type 2  . Gastroparesis 05/2013  . GERD (gastroesophageal reflux disease)   . HTN (hypertension)   . Hyperlipidemia   . Hyperlipidemia   . Major depression, chronic 08/13/2012  . Melanoma ([place]) 2007   surgery at [facility], Followed by [facility]  . Myocardial infarction ([place]) 2012  . Obesity   .  Tobacco abuse   . Tubular adenoma   . Urinary retention     Current Outpatient Medications:  .  albuterol (PROVENTIL HFA;VENTOLIN HFA) 108 (90 Base) MCG/ACT inhaler, Inhale 2 puffs into the lungs every 6 (six) hours as needed for wheezing., Disp: 1 Inhaler, Rfl: 2 .  albuterol (PROVENTIL) (2.5 MG/3ML) 0.083% nebulizer solution, Take 3 mLs (2.5 mg total) by nebulization every 6 (six) hours as needed for wheezing or shortness of breath., Disp: 75 mL, Rfl: 3 .  aspirin EC 81 MG tablet, Take 81 mg by mouth daily., Disp: , Rfl:  .  atorvastatin (LIPITOR) 80 MG tablet, Take 1 tablet by mouth once daily, Disp: 90 tablet, Rfl: 0 .  busPIRone (BUSPAR) 7.5 MG tablet, Take 1 tablet (7.5 mg total) by mouth 2 (two) times daily., Disp: 60 tablet, Rfl: 2 .  fenofibrate (TRICOR) 145 MG tablet, Take 1 tablet (145 mg total) by mouth daily., Disp: 90 tablet, Rfl: 3 .  furosemide (LASIX) 20 MG tablet, Take 1 tablet (20 mg total) by mouth daily., Disp: 10 tablet, Rfl: 0 .  gabapentin (NEURONTIN) 600 MG tablet, Take 1 tablet (600 mg total) by mouth 3 (three) times daily., Disp: 270 tablet, Rfl: 1 .  insulin aspart (NOVOLOG FLEXPEN) 100 UNIT/ML FlexPen, Inject 10 Units into the skin 3 (three) times daily with meals. Inject up to 10 units prior to meal per sliding scale., Disp: 15 mL, Rfl: 2 .  Insulin Glargine (LANTUS SOLOSTAR) 100 UNIT/ML Solostar Pen, Inject 65-70 Units into the skin daily., Disp: 21 mL, Rfl: 3 .  lidocaine (XYLOCAINE) 5 % ointment, Apply 1 application topically 4 (four) times daily as needed. For pain, Disp: 35.44 g, Rfl: 0 .  linaGLIPtin-metFORMIN HCl (JENTADUETO) 2.02-999 MG TABS, Take 1 tablet by mouth 2 (two) times daily with a meal., Disp: 180 tablet, Rfl: 0 .  lisinopril (PRINIVIL,ZESTRIL) 5 MG tablet, TAKE 1 TABLET BY MOUTH AT BEDTIME, Disp: 90 tablet, Rfl: 0 .  loratadine (CLARITIN) 10 MG tablet, Take 10 mg by mouth daily as needed for allergies., Disp: , Rfl:  .  nitroGLYCERIN (NITROSTAT)  0.4 MG SL tablet, Place 1 tablet (0.4 mg total) under the tongue every 5 (five) minutes as needed. For chest pain., Disp: 25 tablet, Rfl: 3 .  omeprazole (PRILOSEC) 40 MG capsule, TAKE 1 CAPSULE BY MOUTH TWICE DAILY (Patient taking differently: TAKE 1 CAPSULE BY MOUTH at bedtime), Disp: 180 capsule, Rfl: 1 .  polyethylene glycol powder (GLYCOLAX/MIRALAX) powder, Mix 1 capful in 8 ounces of water and drink daily for constipation. (Patient taking differently: Take 17 g by mouth daily as needed for mild constipation or moderate constipation. Mix 1 capful in 8 ounces of water and drink daily for constipation.), Disp: 250 g, Rfl: 1 .  sertraline (ZOLOFT) 100 MG tablet, Take 1 tablet (100 mg total) by mouth daily., Disp: 90 tablet, Rfl: 3 .  tamsulosin (FLOMAX) 0.4 MG CAPS capsule, Take 1 capsule (0.4 mg total) by mouth daily after breakfast., Disp: 30 capsule, Rfl: 0 .  traZODone (DESYREL) 150 MG tablet, TAKE 1 TABLET BY MOUTH ONCE DAILY AT BEDTIME, Disp: 90 tablet, Rfl: 0 Social History   Socioeconomic History  . Marital status: Divorced    Spouse name: rayann Keltner  . Number of children: 0  . Years of education: Not on file  . Highest education level: Not on file  Occupational History  . Occupation: disabled  Social Needs  . Financial resource strain: Somewhat hard  . Food insecurity:    Worry: Sometimes true    Inability: Never true  . Transportation needs:    Medical: No    Non-medical: Not on file  Tobacco Use  . Smoking status: Current Every Day Smoker    Packs/day: 1.00    Years: 31.00    Howze years: 31.00    Types: Cigarettes    Start date: 10/09/1981  . Smokeless tobacco: Never Used  . Tobacco comment: 1/2 [place] daily 11/01/17  Substance and Sexual Activity  . 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  . Drug use: No  . Sexual activity: Yes  Lifestyle  .  Physical activity:    Days per week: 7 days    Minutes per session: 30 min  . Stress: Rather much  Relationships  . Social connections:    Talks on phone: More than three times a week    Gets together: More than three times a week    Attends religious service: Never    Active member of club or organization: No    Attends meetings of clubs or organizations: Never    Relationship status: Divorced  . Intimate partner violence:    Fear of current or ex partner: No    Emotionally abused: No    Physically abused: No  Forced sexual activity: No  Other Topics Concern  . Not on file  Social History Narrative   Lives at home in an apartment alone. He is divorced and does not have any children. He has two brothers but is estranged from one. He only talks with the other one infrequently. He does have contact with his ex wife and they have a good relationship. They help each other out.    Family History  Problem Relation Age of Onset  . Stroke Mother   . Alcohol abuse Father   . Lung cancer Other   . Heart disease Other   . Other Other        not real familiar with family history  . Colon cancer Neg Hx   . Colon polyps Neg Hx     Objective: Office vital signs reviewed. BP 131/75   Pulse 82   Temp (!) 96.9 F (36.1 C) (Oral)   Ht 6\' 2"  (1.88 m)   Wt 198 lb (89.8 kg)   BMI 25.42 kg/m   Physical Examination:  General: Awake, alert, No acute distress GU: Mildly edematous foreskin noted.  He has tenderness to palpation.  No skin breakdown, increased warmth or significant discoloration noted.  He has quite a bit of milky discharge noted.  Attempt to retract foreskin fully was not made. MSK: Normal gait and station.  No gross joint deformity or swelling noted. Assessment/ Plan: 53 y.o. male   1. Paraphimosis Symptoms are likely related to ongoing paraphimosis.  I have added triamcinolone to apply twice daily to the affected area for 1 week.  I have renewed his referral to urology  and highly encouraged him to see them for consideration of circumcision - Ambulatory referral to Urology - triamcinolone (KENALOG) 0.025 % cream; Apply 1 application topically 2 (two) times daily. x1 week  Dispense: 30 g; Refill: 0  2. Acute cystitis without hematuria His urinalysis showed greater than 30 white blood cells and many bacteria.  I am empirically treating him with oral antibiotics which would also cover for soft tissue infection.  Urine culture added.  Check GC CT to rule out STI - Urinalysis, Complete - GC/Chlamydia Probe Amp - cephALEXin (KEFLEX) 500 MG capsule; Take 1 capsule (500 mg total) by mouth 2 (two) times daily for 7 days.  Dispense: 14 capsule; Refill: 0 - Urine Culture  3. Urethral discharge As above - Urinalysis, Complete - GC/Chlamydia Probe Amp - cephALEXin (KEFLEX) 500 MG capsule; Take 1 capsule (500 mg total) by mouth 2 (two) times daily for 7 days.  Dispense: 14 capsule; Refill: 0  4. Primary osteoarthritis involving multiple joints We discussed that he is on maximum dose of Neurontin for creatinine clearance of 70 mL/min.  I have added Voltaren gel in efforts to improve joint pain. - diclofenac sodium (VOLTAREN) 1 % GEL; Apply 4 g topically 4 (four) times daily.  Dispense: 400 g; Refill: 0   No orders of the defined types were placed in this encounter.  No orders of the defined types were placed in this encounter.    [name], DO [place] [phone]

## 2019-01-31 ENCOUNTER — Other Ambulatory Visit: Payer: Self-pay | Admitting: Family Medicine

## 2019-01-31 LAB — GC/CHLAMYDIA PROBE AMP
Chlamydia trachomatis, NAA: NEGATIVE
Neisseria Gonorrhoeae by PCR: NEGATIVE

## 2019-01-31 LAB — URINE CULTURE

## 2019-01-31 MED ORDER — CIPROFLOXACIN HCL 250 MG PO TABS: 250.0000 mg | ORAL_TABLET | Freq: Two times a day (BID) | ORAL | 0 refills | Status: DC

## 2019-02-04 ENCOUNTER — Telehealth: Payer: Medicare Other | Admitting: *Deleted

## 2019-02-15 ENCOUNTER — Other Ambulatory Visit: Payer: Self-pay | Admitting: Family Medicine

## 2019-02-18 ENCOUNTER — Ambulatory Visit (INDEPENDENT_AMBULATORY_CARE_PROVIDER_SITE_OTHER): Payer: Medicare Other | Admitting: Licensed Clinical Social Worker

## 2019-02-18 DIAGNOSIS — Z794 Long term (current) use of insulin: Secondary | ICD-10-CM

## 2019-02-18 DIAGNOSIS — E1159 Type 2 diabetes mellitus with other circulatory complications: Secondary | ICD-10-CM

## 2019-02-18 DIAGNOSIS — E1142 Type 2 diabetes mellitus with diabetic polyneuropathy: Secondary | ICD-10-CM | POA: Diagnosis not present

## 2019-02-18 DIAGNOSIS — F329 Major depressive disorder, single episode, unspecified: Secondary | ICD-10-CM | POA: Diagnosis not present

## 2019-02-18 DIAGNOSIS — I10 Essential (primary) hypertension: Secondary | ICD-10-CM

## 2019-02-18 DIAGNOSIS — J441 Chronic obstructive pulmonary disease with (acute) exacerbation: Secondary | ICD-10-CM | POA: Diagnosis not present

## 2019-02-18 DIAGNOSIS — Z720 Tobacco use: Secondary | ICD-10-CM

## 2019-02-18 DIAGNOSIS — I152 Hypertension secondary to endocrine disorders: Secondary | ICD-10-CM

## 2019-02-18 DIAGNOSIS — K089 Disorder of teeth and supporting structures, unspecified: Secondary | ICD-10-CM

## 2019-02-18 DIAGNOSIS — F32A Depression, unspecified: Secondary | ICD-10-CM

## 2019-02-18 NOTE — Chronic Care Management (AMB) (Signed)
  Care Management Note   Dustin Cruz is a 53 y.o. year old male who is a primary care patient of [name], DO. The CM team was consulted for assistance with chronic disease management and care coordination.   I reached out to [place] by phone today.   Review of patient status, including review of consultants reports, relevant laboratory and other test results, and collaboration with appropriate care team members and the patient's provider was performed as part of comprehensive patient evaluation and provision of chronic care management services.   Social Determinants of Health:Risk for tobacco exposure; risk for stress; risk for food insecurity     Office Visit from 01/29/2019 in [place]  PHQ-9 Total Score  0     Goals Addressed            This Visit's Progress   . Client said he had dental care issues and needs to see dentist for care (pt-stated)       Barriers:  Dental pain issues Financial Challenges Occasional transport challenges (client does not have a car to drive)  Clinical Social Work Goal: Over next 30 days , client  will work with LCSW to address concerns related to dental care needs of client  Interventions: LCSW encouraged client to communicate with RNCM regarding nursing needs of client Previously provided patient with information regarding dentist, Dr. Costella Hatcher, [phone], for patient to contact to schedule client dental appointment Encouraged patient to call Dr. Costella Hatcher to schedule client dental appointment.  Talked with client about current dental needs of client Talked with client about pain issues faced by client  Patient Self Care Activities: Self administers medications as prescribed Attends all scheduled provider appointments Performs ADL's independently  Plan: Client to call LCSW as needed to discuss psychosocial needs of client LCSW to call client in next 3 weeks to discuss current client needs  including dental needs of client Client to communicate with RN CM to discuss nursing needs of client.  Client to call dentist, Dr. Costella Hatcher , at [phone], to schedule client dental appointment     Client spoke of pain in left leg.He said he is taking medications as prescribed. He has prescribed medications. Client said he is hoping to get dental appointment with dentist in [place], [place] in next month.  Client said he did not have transport challenges at present. He said he is eating well. He said he is having difficulty sleeping.  He said he thinks that stress may be causing him to be having difficulty in getting enough sleep at night. He said he sleeps short periods of time and then wakes up at night.   Follow Up Plan: LCSW to call client in next 3 weeks to discuss client needs at that time and also to discuss dental needs of client at that time  Norva Riffle.Shown Dissinger MSW, LCSW Licensed Clinical Social Worker [facility] [phone]

## 2019-02-18 NOTE — Patient Instructions (Signed)
Licensed Clinical Social Worker Visit Information  Goals we discussed today:  Goals Addressed            This Visit's Progress   . Client said he had dental care issues and needs to see dentist for care (pt-stated)       Barriers:  Dental pain issues Financial Challenges Occasional transport challenges (client does not have a car)  Clinical Social Work Goal: Over next 30 days , client  will work with LCSW to address concerns related to dental care needs of client  Interventions: LCSW encouraged client to communicate with RNCM regarding nursing needs of client Previously provided patient with information regarding dentist, Dr. Costella Hatcher, [phone], for patient to contact to schedul eclient dental appointment Encouraged patient to call Dr. Costella Hatcher to schedule client dental appointment.  Talked with client about current dental needs of client Talked with client about current pain issues of client. .  Patient Self Care Activities: Self administers medications as prescribed Attends all scheduled provider appointments Performs ADL's independently  Plan: Client to call LCSW as needed to discuss psychosocial needs of client LCSW to call client in next 3 weeks to discuss current client needs including dental needs of client Client to communicate with RN CM to discuss nursing needs of client.  Client to call dentist, Dr. Costella Hatcher , at [phone], to schedule client dental appointment     Materials Provided: No  Follow Up Plan: LCSW to call client in next 3 weeks to discuss current client needs including dental needs of client  The patient verbalized understanding of instructions provided today and declined a print copy of patient instruction materials.   Norva Riffle.Schylar Wuebker MSW, LCSW Licensed Clinical Social Worker [facility] [phone]

## 2019-02-25 ENCOUNTER — Encounter: Payer: Self-pay | Admitting: Orthopaedic Surgery

## 2019-03-05 ENCOUNTER — Telehealth: Payer: Self-pay | Admitting: Family Medicine

## 2019-03-05 NOTE — Telephone Encounter (Signed)
LMOVM for pt to call manufacturer or [employer]

## 2019-03-07 DIAGNOSIS — R8279 Other abnormal findings on microbiological examination of urine: Secondary | ICD-10-CM | POA: Diagnosis not present

## 2019-03-07 DIAGNOSIS — N471 Phimosis: Secondary | ICD-10-CM | POA: Diagnosis not present

## 2019-03-07 DIAGNOSIS — N3000 Acute cystitis without hematuria: Secondary | ICD-10-CM | POA: Diagnosis not present

## 2019-03-11 ENCOUNTER — Telehealth: Payer: Self-pay | Admitting: Family Medicine

## 2019-03-11 ENCOUNTER — Ambulatory Visit (INDEPENDENT_AMBULATORY_CARE_PROVIDER_SITE_OTHER): Payer: Medicare Other | Admitting: Licensed Clinical Social Worker

## 2019-03-11 DIAGNOSIS — F329 Major depressive disorder, single episode, unspecified: Secondary | ICD-10-CM | POA: Diagnosis not present

## 2019-03-11 DIAGNOSIS — Z794 Long term (current) use of insulin: Secondary | ICD-10-CM

## 2019-03-11 DIAGNOSIS — E1142 Type 2 diabetes mellitus with diabetic polyneuropathy: Secondary | ICD-10-CM | POA: Diagnosis not present

## 2019-03-11 DIAGNOSIS — J441 Chronic obstructive pulmonary disease with (acute) exacerbation: Secondary | ICD-10-CM

## 2019-03-11 DIAGNOSIS — E1159 Type 2 diabetes mellitus with other circulatory complications: Secondary | ICD-10-CM

## 2019-03-11 DIAGNOSIS — I10 Essential (primary) hypertension: Secondary | ICD-10-CM | POA: Diagnosis not present

## 2019-03-11 DIAGNOSIS — F32A Depression, unspecified: Secondary | ICD-10-CM

## 2019-03-11 DIAGNOSIS — K089 Disorder of teeth and supporting structures, unspecified: Secondary | ICD-10-CM

## 2019-03-11 DIAGNOSIS — Z720 Tobacco use: Secondary | ICD-10-CM

## 2019-03-11 NOTE — Chronic Care Management (AMB) (Signed)
  Care Management Note   Dustin Cruz is a 53 y.o. year old male who is a primary care patient of [name], DO. The CM team was consulted for assistance with chronic disease management and care coordination.   I reached out to [place] by phone today.   Review of patient status, including review of consultants reports, relevant laboratory and other test results, and collaboration with appropriate care team members and the patient's provider was performed as part of comprehensive patient evaluation and provision of chronic care management services.   Social Determinants of Health:Risk for Stress; Risk for tobacco exposure; risk for food insecurity    Office Visit from 01/29/2019 in [place]  PHQ-9 Total Score  0     Goals Addressed            This Visit's Progress   . Client said he had dental care issues and needs to see dentist for care (pt-stated)       Barriers:  Dental pain issues Financial Challenges  Clinical Social Work Goal: Over next 30 days , client  will work with LCSW to address concerns related to dental care needs of client  Interventions: LCSW encouraged client to communicate with RNCM regarding nursing needs of client Previously provided patient with information regarding dentist, Dr. Costella Hatcher, [phone], for patient to contact to schedul eclient dental appointment Encouraged patient to call Dr. Costella Hatcher to schedule client dental appointment.  Talked with client about current dental needs of client and dental resources for client in the area Talked with client about upcoming client medical appointments  Patient Self Care Activities: Self administers medications as prescribed Attends all scheduled provider appointments Performs ADL's independently  Plan: Client to call LCSW as needed to discuss psychosocial needs of client LCSW to call client in next 3 weeks to discuss current client needs including dental  needs of client Client to communicate with RN CM to discuss nursing needs of client.  Client to call dentist, Dr. Costella Hatcher , at [phone], to schedule client dental appointment       Client spoke of current dental issues. He said he was managing related to dental issues. He said that he hopes to schedule dental appointment with dentist in [place] has talked with client for several months regarding dental appointment for client. Client said he plans to contact [facility] related to A1C blood draw for client. LCSW reminded client of CCM program support with RNCM and LCSW. Client has LCSW phone number to call LCSW related to social work needs of client.  Follow Up Plan: LCSW to call client in next 3 weeks to talk with client about client needs including dental needs of client  Norva Riffle.Leola Fiore MSW, LCSW Licensed Clinical Social Worker [facility] [phone]

## 2019-03-11 NOTE — Patient Instructions (Signed)
Licensed Clinical Social Worker Visit Information  Goals we discussed today:  Goals Addressed            This Visit's Progress   . Client said he had dental care issues and needs to see dentist for care (pt-stated)       Barriers:  Dental pain issues Financial Challenges  Clinical Social Work Goal: Over next 30 days , client  will work with LCSW to address concerns related to dental care needs of client  Interventions: LCSW encouraged client to communicate with RNCM regarding nursing needs of client Previously provided patient with information regarding dentist, Dr. Costella Hatcher, [phone], for patient to contact to schedule client dental appointment Encouraged patient to call Dr. Costella Hatcher to schedule client dental appointment.  Talked with client about current dental needs of client and dental resources for client in the area Talked with client about upcoming client medical appointments  Patient Self Care Activities: Self administers medications as prescribed Attends all scheduled provider appointments Performs ADL's independently  Plan: Client to call LCSW as needed to discuss psychosocial needs of client LCSW to call client in next 3 weeks to discuss current client needs including dental needs of client Client to communicate with RN CM to discuss nursing needs of client.  Client to call dentist, Dr. Costella Hatcher , at [phone], to schedule client dental appointment        Materials Provided: No  Follow Up Plan: LCSW to call client in next 3 weeks to discuss current client needs including dental needs of client  The patient verbalized understanding of instructions provided today and declined a print copy of patient instruction materials.   Norva Riffle.Ja Pistole MSW, LCSW Licensed Clinical Social Worker [facility] [phone]

## 2019-03-17 ENCOUNTER — Other Ambulatory Visit: Payer: Self-pay

## 2019-03-17 ENCOUNTER — Encounter (HOSPITAL_COMMUNITY): Payer: Self-pay | Admitting: *Deleted

## 2019-03-17 ENCOUNTER — Observation Stay (HOSPITAL_COMMUNITY)
Admission: EM | Admit: 2019-03-17 | Discharge: 2019-03-17 | Discharge status: Against Medical Advice | Payer: Medicare Other | Attending: Internal Medicine | Admitting: Internal Medicine

## 2019-03-17 ENCOUNTER — Emergency Department (HOSPITAL_COMMUNITY): Payer: Medicare Other

## 2019-03-17 DIAGNOSIS — R55 Syncope and collapse: Secondary | ICD-10-CM | POA: Diagnosis not present

## 2019-03-17 DIAGNOSIS — Z85820 Personal history of malignant melanoma of skin: Secondary | ICD-10-CM | POA: Diagnosis not present

## 2019-03-17 DIAGNOSIS — Z720 Tobacco use: Secondary | ICD-10-CM

## 2019-03-17 DIAGNOSIS — Z1159 Encounter for screening for other viral diseases: Secondary | ICD-10-CM | POA: Diagnosis not present

## 2019-03-17 DIAGNOSIS — N179 Acute kidney failure, unspecified: Secondary | ICD-10-CM | POA: Diagnosis present

## 2019-03-17 DIAGNOSIS — R079 Chest pain, unspecified: Secondary | ICD-10-CM

## 2019-03-17 DIAGNOSIS — R739 Hyperglycemia, unspecified: Secondary | ICD-10-CM

## 2019-03-17 DIAGNOSIS — Z794 Long term (current) use of insulin: Secondary | ICD-10-CM | POA: Insufficient documentation

## 2019-03-17 DIAGNOSIS — N1831 Chronic kidney disease, stage 3a: Secondary | ICD-10-CM

## 2019-03-17 DIAGNOSIS — Z7951 Long term (current) use of inhaled steroids: Secondary | ICD-10-CM | POA: Diagnosis not present

## 2019-03-17 DIAGNOSIS — E1143 Type 2 diabetes mellitus with diabetic autonomic (poly)neuropathy: Secondary | ICD-10-CM | POA: Diagnosis not present

## 2019-03-17 DIAGNOSIS — E11319 Type 2 diabetes mellitus with unspecified diabetic retinopathy without macular edema: Secondary | ICD-10-CM | POA: Insufficient documentation

## 2019-03-17 DIAGNOSIS — Z8249 Family history of ischemic heart disease and other diseases of the circulatory system: Secondary | ICD-10-CM | POA: Insufficient documentation

## 2019-03-17 DIAGNOSIS — I129 Hypertensive chronic kidney disease with stage 1 through stage 4 chronic kidney disease, or unspecified chronic kidney disease: Secondary | ICD-10-CM | POA: Diagnosis not present

## 2019-03-17 DIAGNOSIS — I700 Atherosclerosis of aorta: Secondary | ICD-10-CM | POA: Insufficient documentation

## 2019-03-17 DIAGNOSIS — E1122 Type 2 diabetes mellitus with diabetic chronic kidney disease: Secondary | ICD-10-CM | POA: Diagnosis not present

## 2019-03-17 DIAGNOSIS — M542 Cervicalgia: Secondary | ICD-10-CM | POA: Diagnosis not present

## 2019-03-17 DIAGNOSIS — K760 Fatty (change of) liver, not elsewhere classified: Secondary | ICD-10-CM | POA: Diagnosis not present

## 2019-03-17 DIAGNOSIS — F329 Major depressive disorder, single episode, unspecified: Secondary | ICD-10-CM | POA: Diagnosis present

## 2019-03-17 DIAGNOSIS — K219 Gastro-esophageal reflux disease without esophagitis: Secondary | ICD-10-CM | POA: Insufficient documentation

## 2019-03-17 DIAGNOSIS — I447 Left bundle-branch block, unspecified: Secondary | ICD-10-CM | POA: Insufficient documentation

## 2019-03-17 DIAGNOSIS — E1165 Type 2 diabetes mellitus with hyperglycemia: Secondary | ICD-10-CM | POA: Diagnosis not present

## 2019-03-17 DIAGNOSIS — F32A Depression, unspecified: Secondary | ICD-10-CM | POA: Diagnosis present

## 2019-03-17 DIAGNOSIS — N183 Chronic kidney disease, stage 3 unspecified: Secondary | ICD-10-CM | POA: Diagnosis present

## 2019-03-17 DIAGNOSIS — E785 Hyperlipidemia, unspecified: Secondary | ICD-10-CM | POA: Diagnosis not present

## 2019-03-17 DIAGNOSIS — I252 Old myocardial infarction: Secondary | ICD-10-CM | POA: Insufficient documentation

## 2019-03-17 DIAGNOSIS — I2510 Atherosclerotic heart disease of native coronary artery without angina pectoris: Secondary | ICD-10-CM | POA: Insufficient documentation

## 2019-03-17 DIAGNOSIS — Z791 Long term (current) use of non-steroidal anti-inflammatories (NSAID): Secondary | ICD-10-CM | POA: Insufficient documentation

## 2019-03-17 DIAGNOSIS — K3184 Gastroparesis: Secondary | ICD-10-CM | POA: Insufficient documentation

## 2019-03-17 DIAGNOSIS — R008 Other abnormalities of heart beat: Secondary | ICD-10-CM | POA: Insufficient documentation

## 2019-03-17 DIAGNOSIS — Z7982 Long term (current) use of aspirin: Secondary | ICD-10-CM | POA: Diagnosis not present

## 2019-03-17 DIAGNOSIS — J449 Chronic obstructive pulmonary disease, unspecified: Secondary | ICD-10-CM | POA: Insufficient documentation

## 2019-03-17 DIAGNOSIS — Z6823 Body mass index (BMI) 23.0-23.9, adult: Secondary | ICD-10-CM | POA: Insufficient documentation

## 2019-03-17 DIAGNOSIS — E782 Mixed hyperlipidemia: Secondary | ICD-10-CM | POA: Diagnosis present

## 2019-03-17 DIAGNOSIS — E559 Vitamin D deficiency, unspecified: Secondary | ICD-10-CM | POA: Diagnosis not present

## 2019-03-17 DIAGNOSIS — Z951 Presence of aortocoronary bypass graft: Secondary | ICD-10-CM | POA: Insufficient documentation

## 2019-03-17 DIAGNOSIS — D735 Infarction of spleen: Secondary | ICD-10-CM | POA: Diagnosis not present

## 2019-03-17 DIAGNOSIS — E119 Type 2 diabetes mellitus without complications: Secondary | ICD-10-CM

## 2019-03-17 DIAGNOSIS — R0689 Other abnormalities of breathing: Secondary | ICD-10-CM | POA: Diagnosis not present

## 2019-03-17 DIAGNOSIS — E1159 Type 2 diabetes mellitus with other circulatory complications: Secondary | ICD-10-CM | POA: Diagnosis present

## 2019-03-17 DIAGNOSIS — R0789 Other chest pain: Secondary | ICD-10-CM | POA: Diagnosis not present

## 2019-03-17 DIAGNOSIS — F17210 Nicotine dependence, cigarettes, uncomplicated: Secondary | ICD-10-CM | POA: Insufficient documentation

## 2019-03-17 DIAGNOSIS — E669 Obesity, unspecified: Secondary | ICD-10-CM | POA: Insufficient documentation

## 2019-03-17 DIAGNOSIS — F419 Anxiety disorder, unspecified: Secondary | ICD-10-CM | POA: Insufficient documentation

## 2019-03-17 DIAGNOSIS — Z79899 Other long term (current) drug therapy: Secondary | ICD-10-CM | POA: Insufficient documentation

## 2019-03-17 DIAGNOSIS — Z955 Presence of coronary angioplasty implant and graft: Secondary | ICD-10-CM | POA: Insufficient documentation

## 2019-03-17 LAB — COMPREHENSIVE METABOLIC PANEL
ALT: 24 U/L (ref 0–44)
AST: 19 U/L (ref 15–41)
Albumin: 4.1 g/dL (ref 3.5–5.0)
Alkaline Phosphatase: 36 U/L — ABNORMAL LOW (ref 38–126)
Anion gap: 11 (ref 5–15)
BUN: 26 mg/dL — ABNORMAL HIGH (ref 6–20)
CO2: 26 mmol/L (ref 22–32)
Calcium: 9.6 mg/dL (ref 8.9–10.3)
Chloride: 94 mmol/L — ABNORMAL LOW (ref 98–111)
Creatinine, Ser: 2.09 mg/dL — ABNORMAL HIGH (ref 0.61–1.24)
GFR calc Af Amer: 41 mL/min — ABNORMAL LOW (ref >60)
GFR calc non Af Amer: 35 mL/min — ABNORMAL LOW (ref >60)
Glucose, Bld: 390 mg/dL — ABNORMAL HIGH (ref 70–99)
Potassium: 4.7 mmol/L (ref 3.5–5.1)
Sodium: 131 mmol/L — ABNORMAL LOW (ref 135–145)
Total Bilirubin: 0.7 mg/dL (ref 0.3–1.2)
Total Protein: 7.3 g/dL (ref 6.5–8.1)

## 2019-03-17 LAB — CBG MONITORING, ED
Glucose-Capillary: 419 mg/dL — ABNORMAL HIGH (ref 70–99)
Glucose-Capillary: 232 mg/dL — ABNORMAL HIGH (ref 70–99)

## 2019-03-17 LAB — CBC
HCT: 43.1 % (ref 39.0–52.0)
Hemoglobin: 14.5 g/dL (ref 13.0–17.0)
MCH: 31.9 pg (ref 26.0–34.0)
MCHC: 33.6 g/dL (ref 30.0–36.0)
MCV: 94.7 fL (ref 80.0–100.0)
Platelets: 328 10*3/uL (ref 150–400)
RBC: 4.55 MIL/uL (ref 4.22–5.81)
RDW: 12.9 % (ref 11.5–15.5)
WBC: 10.8 10*3/uL — ABNORMAL HIGH (ref 4.0–10.5)
nRBC: 0 % (ref 0.0–0.2)

## 2019-03-17 LAB — SARS CORONAVIRUS 2 BY RT PCR (HOSPITAL ORDER, PERFORMED IN ~~LOC~~ HOSPITAL LAB): SARS Coronavirus 2: NEGATIVE

## 2019-03-17 LAB — TROPONIN I: Troponin I: <0.03 ng/mL (ref <0.03)

## 2019-03-17 LAB — I-STAT CREATININE, ED: Creatinine, Ser: 2 mg/dL — ABNORMAL HIGH (ref 0.61–1.24)

## 2019-03-17 LAB — CK: Total CK: 400 U/L — ABNORMAL HIGH (ref 49–397)

## 2019-03-17 MED ORDER — SODIUM CHLORIDE 0.9 % IV SOLN
INTRAVENOUS | Status: DC
  Administered 2019-03-17: 17:00:00 via INTRAVENOUS

## 2019-03-17 MED ORDER — SODIUM CHLORIDE 0.9 % IV BOLUS
1000.0000 mL | Freq: Once | INTRAVENOUS | Status: AC
  Administered 2019-03-17: 1000 mL via INTRAVENOUS

## 2019-03-17 MED ORDER — ASPIRIN 81 MG PO CHEW
162.0000 mg | CHEWABLE_TABLET | Freq: Once | ORAL | Status: AC
  Administered 2019-03-17: 15:00:00 162 mg via ORAL
  Filled 2019-03-17: qty 2

## 2019-03-17 MED ORDER — IOHEXOL 350 MG/ML SOLN
100.0000 mL | Freq: Once | INTRAVENOUS | Status: AC | PRN
  Administered 2019-03-17: 75 mL via INTRAVENOUS

## 2019-03-17 MED ORDER — SODIUM CHLORIDE 0.9% FLUSH: 3.0000 mL | Freq: Two times a day (BID) | INTRAVENOUS | Status: DC

## 2019-03-17 MED ORDER — SODIUM CHLORIDE 0.9 % IV BOLUS: 500.0000 mL | Freq: Once | INTRAVENOUS | Status: DC

## 2019-03-17 MED ORDER — INSULIN ASPART 100 UNIT/ML ~~LOC~~ SOLN
0.0000 [IU] | Freq: Three times a day (TID) | SUBCUTANEOUS | Status: DC
  Administered 2019-03-17: 3 [IU] via SUBCUTANEOUS
  Filled 2019-03-17: qty 1

## 2019-03-17 NOTE — ED Provider Notes (Signed)
[facility] EMERGENCY DEPARTMENT Provider Note   CSN: [id number] Arrival date & time: 03/17/19  1314    History   Chief Complaint Chief Complaint  Patient presents with  . Chest Pain    HPI Dustin Cruz is a 53 y.o. male.     HPI   Patient is a 53 year old male with past medical history of type 2 diabetes mellitus, CKD stage III, COPD, coronary artery disease status post BMS to ramus in 2012 with residual 70 to 80% stenosis, hyperlipidemia, hypertension, major depressive disorder presenting for near syncope and chest pain.  Patient reports that his symptoms began around 12:30 PM.  He reports he was sitting in his truck after assisting his friend and moving some branches when he experienced sensation of not being able to keep his head up.  Patient reports he tried to get up and walk but felt that he was going to syncopized.  He then sat down and experienced some central and nonradiating chest pain.  Patient reports that he did not fully lose consciousness.  Felt short of breath at the time but denies this currently.  He reports he is chest pain-free currently and just has some discomfort to the right side to back of his neck.  Currently he denies headache, visual disturbance, weakness or numbness of extremities.  He does report sensation of numbness in bilateral lower extremities.  No back pain.  Denies fever, chills, cough, or shortness of breath.  Patient reports that he thought he was hypoglycemic and ate a cookie.  He reports that he took his antihyperglycemic pill this morning consisting of metformin and linagliptin, but he does not take insulin as he feels it drops his sugar too much.  Patient ports that his CBG this morning was 175.  He did take his 81 mg of aspirin this morning.  SBP in the field was 90.  Patient was given 500 mg bolus by EMS.  No other interventions.  Past Medical History:  Diagnosis Date  . Anxiety   . Asthma   . CKD stage 3 due to type 2 diabetes mellitus ([place])    . COPD (chronic obstructive pulmonary disease) ([place])   . Coronary artery disease    s/p BMS to Ramus 10/2010;  Cath 01/22/12 prox 30-40% LAD, LCx ramus w/ hazy 70-80% in-stent restenosis, EF 60% treated medically  . Coronary artery disease    s/p BMS to Ramus 10/2010;  Cath 01/22/12 prox 30-40% LAD, LCx ramus w/ hazy 70-80% in-stent restenosis, EF 60% treated medically   . Depression   . Diabetes mellitus    Type 2  . Gastroparesis 05/2013  . GERD (gastroesophageal reflux disease)   . HTN (hypertension)   . Hyperlipidemia   . Hyperlipidemia   . Major depression, chronic 08/13/2012  . Melanoma ([place]) 2007   surgery at [facility], Followed by [place]  . Myocardial infarction ([place]) 2012  . Obesity   . Tobacco abuse   . Tubular adenoma   . Urinary retention     Patient Active Problem List   Diagnosis Date Noted  . CKD stage 3 due to type 2 diabetes mellitus ([place]) 11/28/2018  . Microalbuminuria due to type 2 diabetes mellitus ([place]) 05/22/2018  . Hyperglycemia 04/24/2018  . Abdominal pain, epigastric 11/29/2017  . Diarrhea 11/29/2017  . History of colonic polyps 11/29/2017  . Diffuse abdominal pain 11/29/2017  . Diabetic retinopathy ([place]) 09/21/2015  . Carotid bruit 09/21/2015  . Viral gastroenteritis 06/03/2015  . Hypertriglyceridemia 05/18/2015  .  Vitamin D deficiency 12/29/2013  . Nausea alone 04/24/2013  . Frequency of urination 04/14/2013  . Fever, unspecified 04/14/2013  . Hepatic steatosis 12/28/2012  . Peripheral neuropathy 11/25/2012  . Melanoma of skin, site unspecified 11/25/2012  . Hyperlipidemia   . Depression   . Hypertension associated with diabetes ([place])   . GERD (gastroesophageal reflux disease) 04/03/2012  . Tobacco abuse 03/14/2012  . Diabetes mellitus, type 2 ([place])   . 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 BIOPSY  01/07/2018   Procedure: BIOPSY;  Surgeon: Daneil Dolin, MD;   [facility];  Service: Endoscopy;;  duodenal and gastric biopsy  . BIOPSY  04/25/2018   Procedure: BIOPSY;  Surgeon: Daneil Dolin, MD;  Location: [facility];  Service: Endoscopy;;  ascending and descending and sigmoid biopsies  . CARDIAC CATHETERIZATION     with stent  . COLONOSCOPY N/A 01/27/2013   YWV:PXTGGY and colonic polyps. Tubular adenomas, poor bowel prep, one-year follow-up surveillance colonoscopy recommended  . COLONOSCOPY WITH PROPOFOL N/A 04/25/2018   Procedure: COLONOSCOPY WITH PROPOFOL;  Surgeon: Daneil Dolin, MD;  Location: [facility];  Service: Endoscopy;  Laterality: N/A;  11:00am - pt to be prepped INPT and labs to be done as well  . CORONARY ANGIOPLASTY WITH STENT PLACEMENT    . ESOPHAGOGASTRODUODENOSCOPY  04/24/2012   [place]-mild erosive reflux esophagitis,dilated w/56F Venia Minks, small HH, minimal chronic gastric/bulbar erosions(No H pylori)  . ESOPHAGOGASTRODUODENOSCOPY (EGD) WITH PROPOFOL N/A 01/07/2018   Dr. Gala Romney: Erythematous mucosa in the stomach, retained gastric contents, incomplete exam.  Biopsied showed mild chronic gastritis but no H. pylori.  Duodenal biopsy was negative for celiac disease.  [name] FLEXIBLE SIGMOIDOSCOPY N/A 01/07/2018   Dr. Gala Romney: Incomplete colonoscopy due to inadequate bowel prep  . LEFT HEART CATHETERIZATION WITH CORONARY ANGIOGRAM N/A 01/22/2012   Procedure: LEFT HEART CATHETERIZATION WITH CORONARY ANGIOGRAM;  Surgeon: Jolaine Artist, MD;  Location: [facility] CATH LAB;  Service: Cardiovascular;  Laterality: N/A;  . melanoma surgery  2007   [place], removed lymph nodes under arm as well,Left abd        Home Medications    Prior to Admission medications   Medication Sig Start Date End Date Taking? Authorizing Provider  albuterol (PROVENTIL HFA;VENTOLIN HFA) 108 (90 Base) MCG/ACT inhaler Inhale 2 puffs into the lungs every 6 (six) hours as needed for wheezing. 01/16/19   [name], DO  albuterol (PROVENTIL) (2.5 MG/3ML)  0.083% nebulizer solution Take 3 mLs (2.5 mg total) by nebulization every 6 (six) hours as needed for wheezing or shortness of breath. 10/15/17   Timmothy Euler, MD  aspirin EC 81 MG tablet Take 81 mg by mouth daily.    [provider]  atorvastatin (LIPITOR) 80 MG tablet Take 1 tablet by mouth once daily 01/08/19   Ronnie Doss M, DO  busPIRone (BUSPAR) 7.5 MG tablet Take 1 tablet (7.5 mg total) by mouth 2 (two) times daily. 11/22/18   [name], DO  ciprofloxacin (CIPRO) 250 MG tablet Take 1 tablet (250 mg total) by mouth 2 (two) times daily. 01/31/19   [name], DO  diclofenac sodium (VOLTAREN) 1 % GEL Apply 4 g topically 4 (four) times daily. 01/29/19   [name], DO  fenofibrate (TRICOR) 145 MG tablet Take 1 tablet (145 mg total) by mouth daily. 04/17/18   [name], DO  furosemide (LASIX) 20 MG tablet Take 1  tablet (20 mg total) by mouth daily. 10/07/17   Julianne Rice, MD  gabapentin (NEURONTIN) 600 MG tablet Take 1 tablet (600 mg total) by mouth 3 (three) times daily. 11/25/18   [name], DO  insulin aspart (NOVOLOG FLEXPEN) 100 UNIT/ML FlexPen Inject 10 Units into the skin 3 (three) times daily with meals. Inject up to 10 units prior to meal per sliding scale. 09/21/17   Timmothy Euler, MD  Insulin Glargine (LANTUS SOLOSTAR) 100 UNIT/ML Solostar Pen Inject 65-70 Units into the skin daily. 11/22/18   [name], DO  lidocaine (XYLOCAINE) 5 % ointment Apply 1 application topically 4 (four) times daily as needed. For pain 08/13/18   Ronnie Doss M, DO  linaGLIPtin-metFORMIN HCl (JENTADUETO) 2.02-999 MG TABS Take 1 tablet by mouth 2 (two) times daily with a meal. 10/10/18   Gottschalk, Leatrice Jewels M, DO  lisinopril (ZESTRIL) 5 MG tablet TAKE 1 TABLET BY MOUTH AT BEDTIME 02/17/19   Ronnie Doss M, DO  loratadine (CLARITIN) 10 MG tablet Take 10 mg by mouth daily as needed for allergies.    [provider]   nitroGLYCERIN (NITROSTAT) 0.4 MG SL tablet Place 1 tablet (0.4 mg total) under the tongue every 5 (five) minutes as needed. For chest pain. 08/13/12   Reece Packer, NP  omeprazole (PRILOSEC) 40 MG capsule TAKE 1 CAPSULE BY MOUTH TWICE DAILY Patient taking differently: TAKE 1 CAPSULE BY MOUTH at bedtime 04/09/18   Ronnie Doss M, DO  polyethylene glycol powder (GLYCOLAX/MIRALAX) powder Mix 1 capful in 8 ounces of water and drink daily for constipation. Patient taking differently: Take 17 g by mouth daily as needed for mild constipation or moderate constipation. Mix 1 capful in 8 ounces of water and drink daily for constipation. 05/22/18   [name], DO  sertraline (ZOLOFT) 100 MG tablet Take 1 tablet (100 mg total) by mouth daily. 09/21/17   Timmothy Euler, MD  tamsulosin (FLOMAX) 0.4 MG CAPS capsule Take 1 capsule (0.4 mg total) by mouth daily after breakfast. 08/11/18   Pollina, Gwenyth Allegra, MD  traZODone (DESYREL) 150 MG tablet TAKE 1 TABLET BY MOUTH ONCE DAILY AT BEDTIME 01/14/19   Ronnie Doss M, DO  triamcinolone (KENALOG) 0.025 % cream Apply 1 application topically 2 (two) times daily. x1 week 01/29/19   Ronnie Doss M, DO  metoprolol tartrate (LOPRESSOR) 25 MG tablet Take 25 mg by mouth 2 (two) times daily.    12/27/11  [provider]    Family History Family History  Problem Relation Age of Onset  . Stroke Mother   . Alcohol abuse Father   . Lung cancer Other   . Heart disease Other   . Other Other        not real familiar with family history  . Colon cancer Neg Hx   . Colon polyps Neg Hx     Social History Social History   Tobacco Use  . Smoking status: Current Every Day Smoker    Packs/day: 1.00    Years: 31.00    Donathan years: 31.00    Types: Cigarettes    Start date: 10/09/1981  . Smokeless tobacco: Never Used  . Tobacco comment: 1/2 [place] daily 11/01/17  Substance Use Topics  . 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  . Drug use: No  Allergies   Patient has no known allergies.   Review of Systems Review of Systems  Constitutional: Negative for chills and fever.  HENT: Negative for congestion, rhinorrhea, sinus pain and sore throat.   Eyes: Negative for visual disturbance.  Respiratory: Positive for chest tightness. Negative for cough and shortness of breath.   Cardiovascular: Positive for palpitations. Negative for chest pain and leg swelling.  Gastrointestinal: Negative for abdominal pain, nausea and vomiting.  Genitourinary: Negative for dysuria and flank pain.  Musculoskeletal: Negative for back pain and myalgias.  Skin: Negative for rash.  Neurological: Positive for light-headedness. Negative for dizziness and syncope.       +Presyncope     Physical Exam Updated Vital Signs BP 105/66 (BP Location: Left Arm)   Pulse 86   Temp 97.9 F (36.6 C) (Oral)   Ht 6\' 2"  (1.88 m)   Wt 83.9 kg   SpO2 99%   BMI 23.75 kg/m   Physical Exam Vitals signs and nursing note reviewed.  Constitutional:      General: He is not in acute distress.    Appearance: He is well-developed.  HENT:     Head: Normocephalic and atraumatic.  Eyes:     Conjunctiva/sclera: Conjunctivae normal.     Pupils: Pupils are equal, round, and reactive to light.  Neck:     Musculoskeletal: Normal range of motion and neck supple.  Cardiovascular:     Rate and Rhythm: Normal rate and regular rhythm.     Pulses:          Radial pulses are 2+ on the right side and 2+ on the left side.       Dorsalis pedis pulses are 1+ on the right side and 1+ on the left side.     Heart sounds: S1 normal and S2 normal. Heart sounds are distant. No murmur.  Pulmonary:     Effort: Pulmonary effort is normal.     Breath sounds: Normal breath sounds. No wheezing or rales.  Abdominal:     General: There is no distension.      Palpations: Abdomen is soft.     Tenderness: There is no abdominal tenderness. There is no guarding.  Musculoskeletal: Normal range of motion.        General: No deformity.  Lymphadenopathy:     Cervical: No cervical adenopathy.  Skin:    General: Skin is warm and dry.     Findings: No erythema or rash.  Neurological:     Mental Status: He is alert.     Comments: Cranial nerves grossly intact. Patient moves extremities symmetrically and with good coordination.  Psychiatric:        Behavior: Behavior normal.        Thought Content: Thought content normal.        Judgment: Judgment normal.      ED Treatments / Results  Labs (all labs ordered are listed, but only abnormal results are displayed) Labs Reviewed  CBC - Abnormal; Notable for the following components:      Result Value   WBC 10.8 (*)    All other components within normal limits  COMPREHENSIVE METABOLIC PANEL - Abnormal; Notable for the following components:   Sodium 131 (*)    Chloride 94 (*)    Glucose, Bld 390 (*)    BUN 26 (*)    Creatinine, Ser 2.09 (*)    Alkaline Phosphatase 36 (*)    GFR calc non Af Amer 35 (*)  GFR calc Af Amer 41 (*)    All other components within normal limits  CBG MONITORING, ED - Abnormal; Notable for the following components:   Glucose-Capillary 419 (*)    All other components within normal limits  I-STAT CREATININE, ED - Abnormal; Notable for the following components:   Creatinine, Ser 2.00 (*)    All other components within normal limits  SARS CORONAVIRUS 2 (HOSPITAL ORDER, [place] [facility])  TROPONIN I  CK    EKG EKG Interpretation  Date/Time:  Monday March 17 2019 13:28:16 EDT Ventricular Rate:  83 PR Interval:    QRS Duration: 148 QT Interval:  416 QTC Calculation: 489 R Axis:   13 Text Interpretation:  Sinus rhythm Prolonged PR interval Left bundle branch block When compared with ECG of 10/07/2017 No significant change was found Confirmed by  Francine Graven [id number]) on 03/17/2019 1:40:57 PM   Radiology Dg Chest Portable 1 View  Result Date: 03/17/2019 CLINICAL DATA:  Chest pain.Patient was working outside. Became lightheaded and feeling hot. Chest and neck pain EXAM: PORTABLE CHEST 1 VIEW COMPARISON:  03/17/2019 FINDINGS: Normal heart size. No pleural effusion or edema. No airspace opacities. The lungs are hyperinflated with coarsened interstitial markings of emphysema. Visualized osseous structures are unremarkable. IMPRESSION: 1. No acute cardiopulmonary abnormalities. Electronically Signed   By: Kerby Moors M.D.   On: 03/17/2019 14:53   Ct Angio Chest/abd/pel For Dissection W And/or W/wo  Result Date: 03/17/2019 CLINICAL DATA:  Chest pain and abdominal pain EXAM: CT ANGIOGRAPHY CHEST, ABDOMEN AND PELVIS TECHNIQUE: Initially, axial CT images were obtained through the chest without intravenous contrast material administration. Multidetector CT imaging through the chest, abdomen and pelvis was performed using the standard protocol during bolus administration of intravenous contrast. Multiplanar reconstructed images and MIPs were obtained and reviewed to evaluate the vascular anatomy. CONTRAST:  52mL OMNIPAQUE IOHEXOL 350 MG/ML SOLN COMPARISON:  CT abdomen and pelvis August 06, 2018. FINDINGS: CTA CHEST FINDINGS Cardiovascular: There is no intramural hematoma in the aorta on noncontrast enhanced study. There is no demonstrable thoracic aortic aneurysm or dissection. Visualized great vessels appear normal except for slight calcification at the origin of the right subclavian artery. There is no demonstrable pulmonary embolus. There is a minimal pericardial effusion which may be within the physiologic range. There is no pericardial thickening. There are foci of coronary artery calcification. There is slight aortic atherosclerosis near the aortic valve. Mediastinum/Nodes: Thyroid appears unremarkable. There is no demonstrable thoracic adenopathy.  No esophageal lesions are evident. Lungs/Pleura: There is slight bibasilar atelectatic change. Lungs elsewhere are clear. No pleural effusion or pleural thickening evident. Musculoskeletal: No blastic or lytic bone lesions. No evident fracture or dislocation. No chest wall lesions are evident. Review of the MIP images confirms the above findings. CTA ABDOMEN AND PELVIS FINDINGS VASCULAR Aorta: There is no abdominal aortic aneurysm or dissection. There are foci of aortic atherosclerotic calcification inferior to the renal arteries with mild plaque in this area. There is no hemodynamically significant obstructive disease in the aorta. Celiac: There is mild atherosclerotic plaque at the origin of the celiac artery without hemodynamically significant obstruction. No other atherosclerotic plaque is noted in the celiac artery branches. In particular, this splenic artery appears unremarkable. No aneurysm or dissection involving the celiac artery or its branches. SMA: The superior mesenteric artery and its branches appear widely patent. No aneurysm or dissection evident. Renals: There is a single renal artery on the left. There is a single renal artery  arising from the aorta which almost immediately branches into 2 adjacent renal arteries. There is no appreciable vessel narrowing or obstructive disease in either renal artery or branches. No fibromuscular dysplasia evident. No aneurysm or dissection. IMA: So inferior mesenteric artery and its branches appear patent. No aneurysm or dissection involving these branches. Inflow: There is moderate calcification in each common iliac artery, slightly more on the left than on the right. No hemodynamically significant obstruction is evident in either common iliac artery. There are areas of patchy atherosclerotic plaque in the left external iliac artery without hemodynamically significant obstruction. There is moderate atherosclerotic plaque in each internal iliac artery without  hemodynamically significant obstruction. There is mild plaque in each common femoral artery without hemodynamically significant obstruction. The proximal superficial femoral and profunda femoral arteries appear widely patent. No aneurysm or dissection involving pelvic arterial vascular structures. Veins: No obvious venous abnormality within the limitations of this arterial phase study. Review of the MIP images confirms the above findings. NON-VASCULAR Hepatobiliary: There is hepatic steatosis. No focal liver lesions are evident. Gallbladder wall is not appreciably thickened. There is no biliary duct dilatation. Pancreas: No pancreatic mass or inflammatory focus. Spleen: There are peripheral splenic infarcts throughout the spleen. Spleen otherwise appears unremarkable. Spleen appears normal in size and contour. Adrenals/Urinary Tract: Adrenals bilaterally appear unremarkable. Kidneys bilaterally show no evident mass or hydronephrosis on either side. There is no evident renal or ureteral calculus on either side. Urinary bladder is midline with wall thickness within normal limits. Stomach/Bowel: There is no appreciable bowel wall or mesenteric thickening. There is no evident bowel obstruction. There is no free air or portal venous air. Terminal ileum appears normal. Lymphatic: There is no demonstrable adenopathy in the abdomen or pelvis. Reproductive: Prostate and seminal vesicles appear normal in size and contour. No pelvic mass evident. Other: Appendix appears normal. There is no abscess or ascites in the abdomen or pelvis. Musculoskeletal: There is degenerative change in the lumbar spine. There are no blastic or lytic bone lesions. There is no intramuscular or abdominal wall lesion. Review of the MIP images confirms the above findings. IMPRESSION: CT angiogram chest: 1. No thoracic aortic aneurysm or dissection. There is slight aortic atherosclerotic calcification in the ascending aorta near the aortic valve. There  is slight atherosclerotic calcification at the origin of the right subclavian artery. There are foci of coronary artery calcification. 2.  No evident pulmonary embolus. 3.  Slight bibasilar atelectasis.  Lungs elsewhere clear. 4.  No evident thoracic adenopathy. CT angiogram abdomen; CT angiogram pelvis: 1. Multiple splenic infarcts. Etiology for these infarcts is uncertain. Splenic artery appears patent without appreciable obstructive disease. There is modest atherosclerotic irregularity at the origin of the celiac artery. There is only minimal aortic atherosclerosis proximal to the level of the splenic artery. Consider cardiac etiology for these infarcts. Note that no apparent cardiac lesion to account for these findings is seen by CT. 2. There is distal abdominal aortic atherosclerosis without hemodynamically significant obstruction. There are multiple foci of pelvic arterial vascular calcification without hemodynamically significant obstructions. No mesenteric atherosclerosis beyond slight atherosclerosis at the origin of the celiac artery. No aneurysm or dissection involving aorta, major mesenteric, and major pelvic arterial vessels. No fibromuscular dysplasia. 3.      Hepatic steatosis. 4. No bowel obstruction or evidence of bowel ischemia. No abscess in the abdomen or pelvis. Appendix region appears normal. 5. No evident renal or ureteral calculus. No hydronephrosis. Urinary bladder wall thickness is within normal limits. Electronically  Signed   By: Lowella Grip III M.D.   On: 03/17/2019 14:55    Procedures Procedures (including critical care time)  Medications Ordered in ED Medications - No data to display   Initial Impression / Assessment and Plan / ED Course  I have reviewed the triage vital signs and the nursing notes.  Pertinent labs & imaging results that were available during my care of the patient were reviewed by me and considered in my medical decision making (see chart for  details).  Clinical Course as of Mar 16 1638  Mon Mar 17, 2019  1350 WBC(!): 10.8 [AM]  1453 Indicative of likely prerenal AKI.   Creatinine(!): 2.00 [AM]  6415 Spoke with Dr. Olevia Bowens who will admit patient. Appreciate his involvement.    [AM]    Clinical Course User Index [AM] Tamala Julian      This is a 53 year old male with multiple high risk chronic medical conditions including type 2 diabetes, coronary artery disease (see below for most recent cath report from April 2013), CKD presenting for near syncope and chest pain.  Differential diagnosis includes ACS, thoracic aortic dissection, pulmonary embolism, volume depletion, vasovagal syncope.  Left main: Normal  AXE:[facility] 30-40% Mild diffuse plaquing throughout. Small bridging section in midsection. Large branching diagonal that came off ostium of LAD. Mild plaque in ostium.  LCX: Gave off small to moderate sized ramus and two small OMs. In ostium of ramus there was a BMS with 70-80% hazy in-stent restenosis. Otherwise mild plaquing in the LCX  RCA: Dominant vessel with PDA and PL.Two 30% lesions in midsection. Mild plaque in PDA.  LV-gram done in the RAO projection: Ejection fraction = 60% No regional wall motion abnormalities.  Work-up demonstrating elevated creatinine today at 2.09.  Up from patient's baseline see below.  Patient is hyperglycemia of 390.  Patient has hypo-natremia of 131, likely secondary to hyperglycemia.  Troponin is negative.  EKG showing old left bundle branch block but no new acute changes.  Chest x-ray showed no widened mediastinum, infiltrate or other cardiopulmonary abnormality, reviewed by me.  Patient's dissection study demonstrates no evidence of dissection or pulmonary embolism.  He does have multiple splenic infarct, possible embolic etiology but not clearly identified.  Anticoagulation not initiated given no active chest pain, unclear chronicity of the splenic infarcts.  Will discuss with  hospital medicine.  Dr. Olevia Bowens to admit for further workup and management.   Final Clinical Impressions(s) / ED Diagnoses   Final diagnoses:  Near syncope  Splenic infarct  Chest pain, unspecified type    ED Discharge Orders    None       Tamala Julian 03/17/19 1639    Isla Pence, MD 03/17/19 1651    Isla Pence, MD 03/17/19 [phone]

## 2019-03-17 NOTE — ED Triage Notes (Signed)
Pt working outside, pt became lightheaded and feeling hot.  CP and neck pain.  Diaphoresis. Pt ate a cookie thinking his sugar was dropping.  [facility] got cbg 426, 500 bolus given due to SBP was 90.  Pt denies cp at present but right sided neck pain 7/10.

## 2019-03-17 NOTE — Discharge Instructions (Signed)
Try to stop smoking.  Return for any concerns.  F/u with pcp.

## 2019-03-18 ENCOUNTER — Telehealth: Payer: Self-pay | Admitting: Cardiovascular Disease

## 2019-03-18 ENCOUNTER — Encounter: Payer: Self-pay | Admitting: Orthopedic Surgery

## 2019-03-18 ENCOUNTER — Other Ambulatory Visit: Payer: Self-pay

## 2019-03-18 NOTE — Telephone Encounter (Signed)

## 2019-03-19 ENCOUNTER — Ambulatory Visit (INDEPENDENT_AMBULATORY_CARE_PROVIDER_SITE_OTHER): Payer: Medicare Other | Admitting: Family Medicine

## 2019-03-19 ENCOUNTER — Other Ambulatory Visit: Payer: Self-pay

## 2019-03-19 ENCOUNTER — Inpatient Hospital Stay (HOSPITAL_COMMUNITY)
Admission: EM | Admit: 2019-03-19 | Discharge: 2019-03-21 | DRG: 816 | Disposition: A | Discharge status: Home / Self Care | Payer: Medicare Other | Attending: Family Medicine | Admitting: Family Medicine

## 2019-03-19 ENCOUNTER — Encounter (HOSPITAL_COMMUNITY): Payer: Self-pay

## 2019-03-19 VITALS — BP 126/74 | HR 88 | Temp 98.0°F | Ht 74.0 in | Wt 195.0 lb

## 2019-03-19 DIAGNOSIS — F419 Anxiety disorder, unspecified: Secondary | ICD-10-CM | POA: Diagnosis present

## 2019-03-19 DIAGNOSIS — R059 Cough, unspecified: Secondary | ICD-10-CM

## 2019-03-19 DIAGNOSIS — Z955 Presence of coronary angioplasty implant and graft: Secondary | ICD-10-CM

## 2019-03-19 DIAGNOSIS — D735 Infarction of spleen: Secondary | ICD-10-CM | POA: Diagnosis not present

## 2019-03-19 DIAGNOSIS — I252 Old myocardial infarction: Secondary | ICD-10-CM

## 2019-03-19 DIAGNOSIS — R0789 Other chest pain: Secondary | ICD-10-CM | POA: Diagnosis not present

## 2019-03-19 DIAGNOSIS — I10 Essential (primary) hypertension: Secondary | ICD-10-CM

## 2019-03-19 DIAGNOSIS — K219 Gastro-esophageal reflux disease without esophagitis: Secondary | ICD-10-CM | POA: Diagnosis not present

## 2019-03-19 DIAGNOSIS — I2510 Atherosclerotic heart disease of native coronary artery without angina pectoris: Secondary | ICD-10-CM | POA: Diagnosis not present

## 2019-03-19 DIAGNOSIS — I447 Left bundle-branch block, unspecified: Secondary | ICD-10-CM | POA: Diagnosis not present

## 2019-03-19 DIAGNOSIS — Z20828 Contact with and (suspected) exposure to other viral communicable diseases: Secondary | ICD-10-CM | POA: Diagnosis present

## 2019-03-19 DIAGNOSIS — Z9119 Patient's noncompliance with other medical treatment and regimen: Secondary | ICD-10-CM

## 2019-03-19 DIAGNOSIS — Z720 Tobacco use: Secondary | ICD-10-CM | POA: Diagnosis present

## 2019-03-19 DIAGNOSIS — J449 Chronic obstructive pulmonary disease, unspecified: Secondary | ICD-10-CM | POA: Diagnosis present

## 2019-03-19 DIAGNOSIS — Z823 Family history of stroke: Secondary | ICD-10-CM | POA: Diagnosis not present

## 2019-03-19 DIAGNOSIS — N183 Chronic kidney disease, stage 3 (moderate): Secondary | ICD-10-CM

## 2019-03-19 DIAGNOSIS — E1122 Type 2 diabetes mellitus with diabetic chronic kidney disease: Secondary | ICD-10-CM

## 2019-03-19 DIAGNOSIS — Z801 Family history of malignant neoplasm of trachea, bronchus and lung: Secondary | ICD-10-CM

## 2019-03-19 DIAGNOSIS — E1159 Type 2 diabetes mellitus with other circulatory complications: Secondary | ICD-10-CM | POA: Diagnosis not present

## 2019-03-19 DIAGNOSIS — F329 Major depressive disorder, single episode, unspecified: Secondary | ICD-10-CM | POA: Diagnosis present

## 2019-03-19 DIAGNOSIS — E1142 Type 2 diabetes mellitus with diabetic polyneuropathy: Secondary | ICD-10-CM

## 2019-03-19 DIAGNOSIS — I129 Hypertensive chronic kidney disease with stage 1 through stage 4 chronic kidney disease, or unspecified chronic kidney disease: Secondary | ICD-10-CM | POA: Diagnosis present

## 2019-03-19 DIAGNOSIS — Z7982 Long term (current) use of aspirin: Secondary | ICD-10-CM

## 2019-03-19 DIAGNOSIS — E11319 Type 2 diabetes mellitus with unspecified diabetic retinopathy without macular edema: Secondary | ICD-10-CM | POA: Diagnosis not present

## 2019-03-19 DIAGNOSIS — Z79899 Other long term (current) drug therapy: Secondary | ICD-10-CM

## 2019-03-19 DIAGNOSIS — E119 Type 2 diabetes mellitus without complications: Secondary | ICD-10-CM

## 2019-03-19 DIAGNOSIS — R531 Weakness: Secondary | ICD-10-CM

## 2019-03-19 DIAGNOSIS — Z811 Family history of alcohol abuse and dependence: Secondary | ICD-10-CM

## 2019-03-19 DIAGNOSIS — F17200 Nicotine dependence, unspecified, uncomplicated: Secondary | ICD-10-CM | POA: Diagnosis present

## 2019-03-19 DIAGNOSIS — R55 Syncope and collapse: Secondary | ICD-10-CM | POA: Diagnosis not present

## 2019-03-19 DIAGNOSIS — E785 Hyperlipidemia, unspecified: Secondary | ICD-10-CM | POA: Diagnosis present

## 2019-03-19 DIAGNOSIS — Z85820 Personal history of malignant melanoma of skin: Secondary | ICD-10-CM

## 2019-03-19 DIAGNOSIS — E782 Mixed hyperlipidemia: Secondary | ICD-10-CM | POA: Diagnosis present

## 2019-03-19 DIAGNOSIS — Z794 Long term (current) use of insulin: Secondary | ICD-10-CM

## 2019-03-19 DIAGNOSIS — R079 Chest pain, unspecified: Secondary | ICD-10-CM

## 2019-03-19 DIAGNOSIS — R05 Cough: Secondary | ICD-10-CM

## 2019-03-19 DIAGNOSIS — F17210 Nicotine dependence, cigarettes, uncomplicated: Secondary | ICD-10-CM | POA: Diagnosis present

## 2019-03-19 DIAGNOSIS — Z8719 Personal history of other diseases of the digestive system: Secondary | ICD-10-CM

## 2019-03-19 DIAGNOSIS — N1831 Chronic kidney disease, stage 3a: Secondary | ICD-10-CM

## 2019-03-19 LAB — COMPREHENSIVE METABOLIC PANEL
ALT: 23 U/L (ref 0–44)
AST: 20 U/L (ref 15–41)
Albumin: 3.7 g/dL (ref 3.5–5.0)
Alkaline Phosphatase: 36 U/L — ABNORMAL LOW (ref 38–126)
Anion gap: 10 (ref 5–15)
BUN: 16 mg/dL (ref 6–20)
CO2: 23 mmol/L (ref 22–32)
Calcium: 9 mg/dL (ref 8.9–10.3)
Chloride: 101 mmol/L (ref 98–111)
Creatinine, Ser: 1.54 mg/dL — ABNORMAL HIGH (ref 0.61–1.24)
GFR calc Af Amer: 59 mL/min — ABNORMAL LOW (ref >60)
GFR calc non Af Amer: 51 mL/min — ABNORMAL LOW (ref >60)
Glucose, Bld: 322 mg/dL — ABNORMAL HIGH (ref 70–99)
Potassium: 4.3 mmol/L (ref 3.5–5.1)
Sodium: 134 mmol/L — ABNORMAL LOW (ref 135–145)
Total Bilirubin: 0.4 mg/dL (ref 0.3–1.2)
Total Protein: 6.4 g/dL — ABNORMAL LOW (ref 6.5–8.1)

## 2019-03-19 LAB — CBC WITH DIFFERENTIAL/PLATELET
Abs Immature Granulocytes: 0.02 10*3/uL (ref 0.00–0.07)
Basophils Absolute: 0.1 10*3/uL (ref 0.0–0.1)
Basophils Relative: 1 %
Eosinophils Absolute: 0.1 10*3/uL (ref 0.0–0.5)
Eosinophils Relative: 1 %
HCT: 39.7 % (ref 39.0–52.0)
Hemoglobin: 13 g/dL (ref 13.0–17.0)
Immature Granulocytes: 0 %
Lymphocytes Relative: 28 %
Lymphs Abs: 2.2 10*3/uL (ref 0.7–4.0)
MCH: 31.6 pg (ref 26.0–34.0)
MCHC: 32.7 g/dL (ref 30.0–36.0)
MCV: 96.6 fL (ref 80.0–100.0)
Monocytes Absolute: 0.8 10*3/uL (ref 0.1–1.0)
Monocytes Relative: 10 %
Neutro Abs: 4.9 10*3/uL (ref 1.7–7.7)
Neutrophils Relative %: 60 %
Platelets: 333 10*3/uL (ref 150–400)
RBC: 4.11 MIL/uL — ABNORMAL LOW (ref 4.22–5.81)
RDW: 12.9 % (ref 11.5–15.5)
WBC: 8.1 10*3/uL (ref 4.0–10.5)
nRBC: 0 % (ref 0.0–0.2)

## 2019-03-19 LAB — CK: Total CK: 245 U/L (ref 49–397)

## 2019-03-19 LAB — GLUCOSE, CAPILLARY: Glucose-Capillary: 319 mg/dL — ABNORMAL HIGH (ref 70–99)

## 2019-03-19 MED ORDER — ATORVASTATIN CALCIUM 40 MG PO TABS
80.0000 mg | ORAL_TABLET | Freq: Every morning | ORAL | Status: DC
  Administered 2019-03-20 – 2019-03-21 (×2): 80 mg via ORAL
  Filled 2019-03-19 (×2): qty 2

## 2019-03-19 MED ORDER — TAMSULOSIN HCL 0.4 MG PO CAPS
0.4000 mg | ORAL_CAPSULE | Freq: Every day | ORAL | Status: DC
  Administered 2019-03-20 – 2019-03-21 (×2): 0.4 mg via ORAL
  Filled 2019-03-19 (×2): qty 1

## 2019-03-19 MED ORDER — ONDANSETRON HCL 4 MG PO TABS: 4.0000 mg | ORAL_TABLET | Freq: Four times a day (QID) | ORAL | Status: DC | PRN

## 2019-03-19 MED ORDER — SERTRALINE HCL 50 MG PO TABS
100.0000 mg | ORAL_TABLET | Freq: Every day | ORAL | Status: DC
  Administered 2019-03-20 – 2019-03-21 (×2): 100 mg via ORAL
  Filled 2019-03-19 (×2): qty 2

## 2019-03-19 MED ORDER — NICOTINE 14 MG/24HR TD PT24
14.0000 mg | MEDICATED_PATCH | Freq: Once | TRANSDERMAL | Status: AC
  Administered 2019-03-19: 19:00:00 14 mg via TRANSDERMAL
  Filled 2019-03-19: qty 1

## 2019-03-19 MED ORDER — ALBUTEROL SULFATE (2.5 MG/3ML) 0.083% IN NEBU: 2.5000 mg | INHALATION_SOLUTION | Freq: Four times a day (QID) | RESPIRATORY_TRACT | Status: DC | PRN

## 2019-03-19 MED ORDER — PANTOPRAZOLE SODIUM 40 MG PO TBEC
40.0000 mg | DELAYED_RELEASE_TABLET | Freq: Every day | ORAL | Status: DC
  Administered 2019-03-20 – 2019-03-21 (×2): 40 mg via ORAL
  Filled 2019-03-19 (×2): qty 1

## 2019-03-19 MED ORDER — BUSPIRONE HCL 5 MG PO TABS
7.5000 mg | ORAL_TABLET | Freq: Two times a day (BID) | ORAL | Status: DC
  Administered 2019-03-19 – 2019-03-21 (×4): 7.5 mg via ORAL
  Filled 2019-03-19 (×4): qty 2

## 2019-03-19 MED ORDER — POLYETHYLENE GLYCOL 3350 17 G PO PACK: 17.0000 g | PACK | Freq: Every day | ORAL | Status: DC | PRN

## 2019-03-19 MED ORDER — ACETAMINOPHEN 650 MG RE SUPP: 650.0000 mg | Freq: Four times a day (QID) | RECTAL | Status: DC | PRN

## 2019-03-19 MED ORDER — TRAZODONE HCL 50 MG PO TABS
150.0000 mg | ORAL_TABLET | Freq: Every day | ORAL | Status: DC
  Administered 2019-03-19 – 2019-03-20 (×2): 150 mg via ORAL
  Filled 2019-03-19 (×2): qty 3

## 2019-03-19 MED ORDER — INSULIN GLARGINE 100 UNIT/ML ~~LOC~~ SOLN
20.0000 [IU] | Freq: Every day | SUBCUTANEOUS | Status: DC
  Administered 2019-03-19 – 2019-03-20 (×2): 20 [IU] via SUBCUTANEOUS
  Filled 2019-03-19 (×3): qty 0.2

## 2019-03-19 MED ORDER — GABAPENTIN 600 MG PO TABS
600.0000 mg | ORAL_TABLET | Freq: Three times a day (TID) | ORAL | Status: DC
  Filled 2019-03-19 (×5): qty 1

## 2019-03-19 MED ORDER — FENOFIBRATE 160 MG PO TABS
160.0000 mg | ORAL_TABLET | Freq: Every day | ORAL | Status: DC
  Administered 2019-03-20 – 2019-03-21 (×2): 160 mg via ORAL
  Filled 2019-03-19 (×2): qty 1

## 2019-03-19 MED ORDER — ONDANSETRON HCL 4 MG/2ML IJ SOLN: 4.0000 mg | Freq: Four times a day (QID) | INTRAMUSCULAR | Status: DC | PRN

## 2019-03-19 MED ORDER — INSULIN ASPART 100 UNIT/ML ~~LOC~~ SOLN
0.0000 [IU] | SUBCUTANEOUS | Status: DC
  Administered 2019-03-19: 7 [IU] via SUBCUTANEOUS
  Administered 2019-03-20: 3 [IU] via SUBCUTANEOUS
  Administered 2019-03-20 (×2): 2 [IU] via SUBCUTANEOUS
  Administered 2019-03-20: 5 [IU] via SUBCUTANEOUS
  Administered 2019-03-21 (×3): 1 [IU] via SUBCUTANEOUS

## 2019-03-19 MED ORDER — HEPARIN (PORCINE) 25000 UT/250ML-% IV SOLN
1800.0000 [IU]/h | INTRAVENOUS | Status: DC
  Administered 2019-03-19: 1050 [IU]/h via INTRAVENOUS
  Administered 2019-03-20: 1350 [IU]/h via INTRAVENOUS
  Filled 2019-03-19 (×3): qty 250

## 2019-03-19 MED ORDER — HEPARIN BOLUS VIA INFUSION
4000.0000 [IU] | Freq: Once | INTRAVENOUS | Status: AC
  Administered 2019-03-19: 4000 [IU] via INTRAVENOUS
  Filled 2019-03-19: qty 4000

## 2019-03-19 MED ORDER — ACETAMINOPHEN 325 MG PO TABS: 650.0000 mg | ORAL_TABLET | Freq: Four times a day (QID) | ORAL | Status: DC | PRN

## 2019-03-19 NOTE — ED Provider Notes (Signed)
[facility] Provider Note   CSN: [id number] Arrival date & time: 03/19/19  1531    History   Chief Complaint Chief Complaint  Patient presents with  . Loss of Consciousness    HPI Dustin Cruz is a 53 y.o. male presenting for evaluation of weakness and loss of consciousness.  Patient states 2 days ago he had chest pain and had a near syncopal event.  He was seen at the hospital that time, was found to have splenic infarcts.  He was recommended that he be admitted to the hospital, but he then left AMA.  He saw his primary care doctor today due to worsening weakness.  It was recommended that he come back to the ER, and he is agreeable for admission at this time.  Patient states since 2 days ago, he has not had recurrence of chest pain or syncope, however he has had increased weakness.  He denies recent fevers, chills, cough, abdominal pain, urinary symptoms, abnormal bowel movements.  Patient with significant medical history including type 2 diabetes, CKD stage III, COPD, CAD status post BMS to ramus in 2012 with residual 70 to 80% stenosis, hyperlipidemia, hypertension, MDD.  Visit from 2 days ago reviewed.  At that time, patient had worsening kidney function and elevated CK.  Due to his risk factors, near syncopal event, and unknown etiology of his splenic infarcts, it was recommended that he be admitted.      HPI  Past Medical History:  Diagnosis Date  . Anxiety   . Asthma   . CKD stage 3 due to type 2 diabetes mellitus ([place])   . COPD (chronic obstructive pulmonary disease) ([place])   . Coronary artery disease    s/p BMS to Ramus 10/2010;  Cath 01/22/12 prox 30-40% LAD, LCx ramus w/ hazy 70-80% in-stent restenosis, EF 60% treated medically  . Coronary artery disease    s/p BMS to Ramus 10/2010;  Cath 01/22/12 prox 30-40% LAD, LCx ramus w/ hazy 70-80% in-stent restenosis, EF 60% treated medically   . Depression   . Diabetes mellitus    Type 2  . Gastroparesis  05/2013  . GERD (gastroesophageal reflux disease)   . HTN (hypertension)   . Hyperlipidemia   . Hyperlipidemia   . Major depression, chronic 08/13/2012  . Melanoma ([place]) 2007   surgery at [facility], Followed by [facility]  . Myocardial infarction ([place]) 2012  . Obesity   . Tobacco abuse   . Tubular adenoma   . Urinary retention     Patient Active Problem List   Diagnosis Date Noted  . Near syncope 03/17/2019  . AKI (acute kidney injury) ([place]) 03/17/2019  . 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 CKD stage 3 due to type 2 diabetes mellitus ([place]) 11/28/2018  . Microalbuminuria due to type 2 diabetes mellitus ([place]) 05/22/2018  . Hyperglycemia 04/24/2018  . Abdominal pain, epigastric 11/29/2017  . Diarrhea 11/29/2017  . History of colonic polyps 11/29/2017  . Diffuse abdominal pain 11/29/2017  . Diabetic retinopathy ([place]) 09/21/2015  . Carotid bruit 09/21/2015  . Viral gastroenteritis 06/03/2015  . Hypertriglyceridemia 05/18/2015  . Vitamin D deficiency 12/29/2013  . Nausea alone 04/24/2013  . Frequency of urination 04/14/2013  . Fever, unspecified 04/14/2013  . Hepatic steatosis 12/28/2012  . Peripheral neuropathy 11/25/2012  . Melanoma of skin, site unspecified 11/25/2012  . Hyperlipidemia   . Depression   . Hypertension associated with diabetes ([place])   . GERD (gastroesophageal reflux disease) 04/03/2012  . Tobacco abuse  03/14/2012  . Diabetes mellitus, type 2 ([place])   . 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 BIOPSY  01/07/2018   Procedure: BIOPSY;  Surgeon: Daneil Dolin, MD;  Location: [facility];  Service: Endoscopy;;  duodenal and gastric biopsy  . BIOPSY  04/25/2018   Procedure: BIOPSY;  Surgeon: Daneil Dolin, MD;  Location: [facility];  Service: Endoscopy;;  ascending and descending and sigmoid biopsies  . CARDIAC CATHETERIZATION     with stent  . COLONOSCOPY N/A 01/27/2013   [id number]  and colonic polyps. Tubular adenomas, poor bowel prep, one-year follow-up surveillance colonoscopy recommended  . COLONOSCOPY WITH PROPOFOL N/A 04/25/2018   Procedure: COLONOSCOPY WITH PROPOFOL;  Surgeon: Daneil Dolin, MD;  Location: [facility];  Service: Endoscopy;  Laterality: N/A;  11:00am - pt to be prepped INPT and labs to be done as well  . CORONARY ANGIOPLASTY WITH STENT PLACEMENT    . ESOPHAGOGASTRODUODENOSCOPY  04/24/2012   [place]-mild erosive reflux esophagitis,dilated w/59F Venia Minks, small HH, minimal chronic gastric/bulbar erosions(No H pylori)  . ESOPHAGOGASTRODUODENOSCOPY (EGD) WITH PROPOFOL N/A 01/07/2018   Dr. Gala Romney: Erythematous mucosa in the stomach, retained gastric contents, incomplete exam.  Biopsied showed mild chronic gastritis but no H. pylori.  Duodenal biopsy was negative for celiac disease.  [name] FLEXIBLE SIGMOIDOSCOPY N/A 01/07/2018   Dr. Gala Romney: Incomplete colonoscopy due to inadequate bowel prep  . LEFT HEART CATHETERIZATION WITH CORONARY ANGIOGRAM N/A 01/22/2012   Procedure: LEFT HEART CATHETERIZATION WITH CORONARY ANGIOGRAM;  Surgeon: Jolaine Artist, MD;  Location: [facility] CATH LAB;  Service: Cardiovascular;  Laterality: N/A;  . melanoma surgery  2007   [place], removed lymph nodes under arm as well,Left abd        Home Medications    Prior to Admission medications   Medication Sig Start Date End Date Taking? Authorizing Provider  albuterol (PROVENTIL HFA;VENTOLIN HFA) 108 (90 Base) MCG/ACT inhaler Inhale 2 puffs into the lungs every 6 (six) hours as needed for wheezing. 01/16/19  Yes Gottschalk, Ashly M, DO  albuterol (PROVENTIL) (2.5 MG/3ML) 0.083% nebulizer solution Take 3 mLs (2.5 mg total) by nebulization every 6 (six) hours as needed for wheezing or shortness of breath. 10/15/17  Yes Timmothy Euler, MD  aspirin EC 81 MG tablet Take 81 mg by mouth every evening.    Yes [provider]  atorvastatin (LIPITOR) 80 MG tablet Take 1 tablet by mouth once daily  Patient taking differently: Take 80 mg by mouth every morning.  01/08/19  Yes Gottschalk, Ashly M, DO  busPIRone (BUSPAR) 7.5 MG tablet Take 1 tablet (7.5 mg total) by mouth 2 (two) times daily. 11/22/18  Yes Gottschalk, Ashly M, DO  fenofibrate (TRICOR) 145 MG tablet Take 1 tablet (145 mg total) by mouth daily. 04/17/18  Yes Gottschalk, Leatrice Jewels M, DO  furosemide (LASIX) 20 MG tablet Take 1 tablet (20 mg total) by mouth daily. 10/07/17  Yes Julianne Rice, MD  gabapentin (NEURONTIN) 600 MG tablet Take 1 tablet (600 mg total) by mouth 3 (three) times daily. Patient taking differently: Take 1,200 mg by mouth 2 (two) times daily.  11/25/18  Yes Gottschalk, Leatrice Jewels M, DO  Insulin Glargine (LANTUS SOLOSTAR) 100 UNIT/ML Solostar Pen Inject 65-70 Units into the skin daily. Patient taking differently: Inject 60 Units into the skin every evening.  11/22/18  Yes Gottschalk, Ashly M, DO  lisinopril (ZESTRIL) 5 MG tablet TAKE 1 TABLET BY MOUTH AT BEDTIME  Patient taking differently: Take 5 mg by mouth at bedtime.  02/17/19  Yes Gottschalk, Leatrice Jewels M, DO  loratadine (CLARITIN) 10 MG tablet Take 10 mg by mouth daily as needed for allergies.   Yes [provider]  nitroGLYCERIN (NITROSTAT) 0.4 MG SL tablet Place 1 tablet (0.4 mg total) under the tongue every 5 (five) minutes as needed. For chest pain. 08/13/12  Yes Reece Packer, NP  omeprazole (PRILOSEC) 40 MG capsule TAKE 1 CAPSULE BY MOUTH TWICE DAILY Patient taking differently: TAKE 1 CAPSULE BY MOUTH at bedtime 04/09/18  Yes Gottschalk, Ashly M, DO  sertraline (ZOLOFT) 100 MG tablet Take 1 tablet (100 mg total) by mouth daily. 09/21/17  Yes Timmothy Euler, MD  tamsulosin (FLOMAX) 0.4 MG CAPS capsule Take 1 capsule (0.4 mg total) by mouth daily after breakfast. 08/11/18  Yes Pollina, Gwenyth Allegra, MD  traZODone (DESYREL) 150 MG tablet TAKE 1 TABLET BY MOUTH ONCE DAILY AT BEDTIME Patient taking differently: Take 150 mg by mouth at bedtime.  01/14/19  Yes  Gottschalk, Ashly M, DO  insulin aspart (NOVOLOG FLEXPEN) 100 UNIT/ML FlexPen Inject 10 Units into the skin 3 (three) times daily with meals. Inject up to 10 units prior to meal per sliding scale. Patient not taking: Reported on 03/17/2019 09/21/17   Timmothy Euler, MD  linaGLIPtin-metFORMIN HCl (JENTADUETO) 2.02-999 MG TABS Take 1 tablet by mouth 2 (two) times daily with a meal. Patient not taking: Reported on 03/17/2019 10/10/18   [name], DO  metoprolol tartrate (LOPRESSOR) 25 MG tablet Take 25 mg by mouth 2 (two) times daily.    12/27/11  [provider]    Family History Family History  Problem Relation Age of Onset  . Stroke Mother   . Alcohol abuse Father   . Lung cancer Other   . Heart disease Other   . Other Other        not real familiar with family history  . Colon cancer Neg Hx   . Colon polyps Neg Hx     Social History Social History   Tobacco Use  . Smoking status: Current Every Day Smoker    Packs/day: 1.00    Years: 31.00    Brecheen years: 31.00    Types: Cigarettes    Start date: 10/09/1981  . Smokeless tobacco: Never Used  . Tobacco comment: 1/2 [place] daily 11/01/17  Substance Use Topics  . 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  . Drug use: No     Allergies   Patient has no known allergies.   Review of Systems Review of Systems  Cardiovascular: Positive for chest pain.  Neurological: Positive for syncope (near syncope) and weakness.  All other systems reviewed and are negative.    Physical Exam Updated Vital Signs BP (!) 144/78 (BP Location: Right Arm)   Pulse 88   Temp 98.1 F (36.7 C) (Oral)   Resp 17   Ht 6\' 2"  (1.88 m)   Wt 88.5 kg   SpO2 97%   BMI 25.04 kg/m   Physical Exam Vitals signs and nursing note reviewed.  Constitutional:      General: He is not in acute distress.    Appearance:  He is well-developed.     Comments: Appears nontoxic  HENT:     Head: Normocephalic and atraumatic.  Eyes:  Extraocular Movements: Extraocular movements intact.     Conjunctiva/sclera: Conjunctivae normal.     Pupils: Pupils are equal, round, and reactive to light.  Neck:     Musculoskeletal: Normal range of motion and neck supple.  Cardiovascular:     Rate and Rhythm: Normal rate and regular rhythm.     Pulses: Normal pulses.  Pulmonary:     Effort: Pulmonary effort is normal. No respiratory distress.     Breath sounds: Normal breath sounds. No wheezing.  Abdominal:     General: There is no distension.     Palpations: Abdomen is soft. There is no mass.     Tenderness: There is no abdominal tenderness. There is no guarding or rebound.  Musculoskeletal: Normal range of motion.  Skin:    General: Skin is warm and dry.     Capillary Refill: Capillary refill takes less than 2 seconds.  Neurological:     Mental Status: He is alert and oriented to person, place, and time.      ED Treatments / Results  Labs (all labs ordered are listed, but only abnormal results are displayed) Labs Reviewed  CBC WITH DIFFERENTIAL/PLATELET - Abnormal; Notable for the following components:      Result Value   RBC 4.11 (*)    All other components within normal limits  COMPREHENSIVE METABOLIC PANEL - Abnormal; Notable for the following components:   Sodium 134 (*)    Glucose, Bld 322 (*)    Creatinine, Ser 1.54 (*)    Total Protein 6.4 (*)    Alkaline Phosphatase 36 (*)    GFR calc non Af Amer 51 (*)    GFR calc Af Amer 59 (*)    All other components within normal limits  CK    EKG None  Radiology No results found.  Procedures Procedures (including critical care time)  Medications Ordered in ED Medications - No data to display   Initial Impression / Assessment and Plan / ED Course  I have reviewed the triage vital signs and the nursing notes.  Pertinent labs & imaging results  that were available during my care of the patient were reviewed by me and considered in my medical decision making (see chart for details).        Pt presenting for cp and near syncope 2 days ago. At that time he was found to have splenic infarct, unknown etiology. Pt high risk for embolic/vascular disease due to medical history. At that time it was recommended he be admitted, but he left ama. On exam today, pt is nontoxic. Will order basic labs to reassess ck and kidney fnx. Will plan for admission for further workup.   Labs reassuring ck normalized and SCr improved. Case discussed with attending, Dr. Roderic Palau agrees to plan. Will call for admission.   Discussed with Dr. Theresa Duty from [facility], pt to be admitted.    Final Clinical Impressions(s) / ED Diagnoses   Final diagnoses:  Splenic infarct  Weakness  Chest pain, unspecified type  Near syncope    ED Discharge Orders    None       Franchot Heidelberg, PA-C 03/19/19 Virl Cagey, MD 03/20/19 2228

## 2019-03-19 NOTE — Patient Instructions (Signed)
I honestly think you do need further work-up in the emergency department.  Very concerned about the fact that you have been having losses of consciousness, your kidneys appear to be in failure and you have these infarcts in your spleen.  We have contacted any [facility] emergency department to let them know that you are willing for admission now.  Please go straight to the emergency department

## 2019-03-19 NOTE — Progress Notes (Signed)
ANTICOAGULATION CONSULT NOTE - Initial Consult  Pharmacy Consult for Heparin Indication: splenic infarcts  No Known Allergies  Patient Measurements: Height: 6\' 2"  (188 cm) Weight: 195 lb (88.5 kg) IBW/kg (Calculated) : 82.2 Heparin Dosing Weight: 88.5 kg  Vital Signs: Temp: 97.8 F (36.6 C) (06/10 2134) Temp Source: Oral (06/10 2104) BP: 138/82 (06/10 2134) Pulse Rate: 64 (06/10 2134)  Labs: Recent Labs    03/17/19 1412 03/17/19 1417 03/19/19 1622  HGB  --  14.5 13.0  HCT  --  43.1 39.7  PLT  --  328 333  CREATININE 2.00* 2.09* 1.54*  CKTOTAL  --  400* 245  TROPONINI  --  <0.03  --     Estimated Creatinine Clearance: 64.5 mL/min (A) (by C-G formula based on SCr of 1.54 mg/dL (H)).   Medical History: Past Medical History:  Diagnosis Date  . Anxiety   . Asthma   . CKD stage 3 due to type 2 diabetes mellitus ([place])   . COPD (chronic obstructive pulmonary disease) ([place])   . Coronary artery disease    s/p BMS to Ramus 10/2010;  Cath 01/22/12 prox 30-40% LAD, LCx ramus w/ hazy 70-80% in-stent restenosis, EF 60% treated medically  . Coronary artery disease    s/p BMS to Ramus 10/2010;  Cath 01/22/12 prox 30-40% LAD, LCx ramus w/ hazy 70-80% in-stent restenosis, EF 60% treated medically   . Depression   . Diabetes mellitus    Type 2  . Gastroparesis 05/2013  . GERD (gastroesophageal reflux disease)   . HTN (hypertension)   . Hyperlipidemia   . Hyperlipidemia   . Major depression, chronic 08/13/2012  . Melanoma ([place]) 2007   surgery at [facility], Followed by [facility]  . Myocardial infarction ([place]) 2012  . Obesity   . Tobacco abuse   . Tubular adenoma   . Urinary retention     Medications:  Medications Prior to Admission  Medication Sig Dispense Refill Last Dose  . albuterol (PROVENTIL HFA;VENTOLIN HFA) 108 (90 Base) MCG/ACT inhaler Inhale 2 puffs into the lungs every 6 (six) hours as needed for wheezing. 1 Inhaler 2 unknown  . albuterol (PROVENTIL) (2.5 MG/3ML)  0.083% nebulizer solution Take 3 mLs (2.5 mg total) by nebulization every 6 (six) hours as needed for wheezing or shortness of breath. 75 mL 3 Past Week at Unknown time  . aspirin EC 81 MG tablet Take 81 mg by mouth every evening.    03/18/2019 at Unknown time  . atorvastatin (LIPITOR) 80 MG tablet Take 1 tablet by mouth once daily (Patient taking differently: Take 80 mg by mouth every morning. ) 90 tablet 0 03/19/2019 at Unknown time  . busPIRone (BUSPAR) 7.5 MG tablet Take 1 tablet (7.5 mg total) by mouth 2 (two) times daily. 60 tablet 2 03/19/2019 at Unknown time  . fenofibrate (TRICOR) 145 MG tablet Take 1 tablet (145 mg total) by mouth daily. 90 tablet 3 03/19/2019 at Unknown time  . furosemide (LASIX) 20 MG tablet Take 1 tablet (20 mg total) by mouth daily. 10 tablet 0 03/19/2019 at Unknown time  . gabapentin (NEURONTIN) 600 MG tablet Take 1 tablet (600 mg total) by mouth 3 (three) times daily. (Patient taking differently: Take 1,200 mg by mouth 2 (two) times daily. ) 270 tablet 1 03/19/2019 at Unknown time  . Insulin Glargine (LANTUS SOLOSTAR) 100 UNIT/ML Solostar Pen Inject 65-70 Units into the skin daily. (Patient taking differently: Inject 60 Units into the skin every evening. ) 21 mL 3 03/18/2019  at Unknown time  . lisinopril (ZESTRIL) 5 MG tablet TAKE 1 TABLET BY MOUTH AT BEDTIME (Patient taking differently: Take 5 mg by mouth at bedtime. ) 90 tablet 0 Past Week at Unknown time  . loratadine (CLARITIN) 10 MG tablet Take 10 mg by mouth daily as needed for allergies.   Past Month at Unknown time  . nitroGLYCERIN (NITROSTAT) 0.4 MG SL tablet Place 1 tablet (0.4 mg total) under the tongue every 5 (five) minutes as needed. For chest pain. 25 tablet 3 unknown  . omeprazole (PRILOSEC) 40 MG capsule TAKE 1 CAPSULE BY MOUTH TWICE DAILY (Patient taking differently: TAKE 1 CAPSULE BY MOUTH at bedtime) 180 capsule 1 03/18/2019 at Unknown time  . sertraline (ZOLOFT) 100 MG tablet Take 1 tablet (100 mg total) by  mouth daily. 90 tablet 3 03/19/2019 at Unknown time  . tamsulosin (FLOMAX) 0.4 MG CAPS capsule Take 1 capsule (0.4 mg total) by mouth daily after breakfast. 30 capsule 0 03/19/2019 at Unknown time  . traZODone (DESYREL) 150 MG tablet TAKE 1 TABLET BY MOUTH ONCE DAILY AT BEDTIME (Patient taking differently: Take 150 mg by mouth at bedtime. ) 90 tablet 0 03/18/2019 at Unknown time  . insulin aspart (NOVOLOG FLEXPEN) 100 UNIT/ML FlexPen Inject 10 Units into the skin 3 (three) times daily with meals. Inject up to 10 units prior to meal per sliding scale. (Patient not taking: Reported on 03/17/2019) 15 mL 2 Not Taking  . linaGLIPtin-metFORMIN HCl (JENTADUETO) 2.02-999 MG TABS Take 1 tablet by mouth 2 (two) times daily with a meal. (Patient not taking: Reported on 03/17/2019) 180 tablet 0 Not Taking   Scheduled:  . [START ON 03/20/2019] atorvastatin  80 mg Oral q morning - 10a  . busPIRone  7.5 mg Oral BID  . [START ON 03/20/2019] fenofibrate  160 mg Oral Daily  . gabapentin  600 mg Oral TID  . heparin  4,000 Units Intravenous Once  . insulin aspart  0-9 Units Subcutaneous Q4H  . insulin glargine  20 Units Subcutaneous Daily  . nicotine  14 mg Transdermal Once  . pantoprazole  40 mg Oral Daily  . [START ON 03/20/2019] sertraline  100 mg Oral Daily  . [START ON 03/20/2019] tamsulosin  0.4 mg Oral QPC breakfast  . traZODone  150 mg Oral QHS   Infusions:  . heparin     PRN: acetaminophen **OR** acetaminophen, albuterol, ondansetron **OR** ondansetron (ZOFRAN) IV, polyethylene glycol  Assessment:  Dustin Cruz is a 53 y.o. male with medical history significant for DM2, Depression, CKD3, CAD, COPD, presented to the Ed 2 days ago with c/o central chest pain that started while he was sitting in his truck, he subsequently felt like he was going to pass out. Chest pain radiated to his back. He reports associated difficulty breathing. Chest and Abdominal Ct was done with Contrast, negative for PE but showed multiple  splenic infarcts. He also had an AKI and mildly elevated CK- 400  Goal of Therapy:  Heparin level 0.3-0.7 units/ml Monitor platelets by anticoagulation protocol: Yes   Plan:  Give 4000 units bolus x 1 Start heparin infusion at 1050 units/hr Check anti-Xa level in 6 hours and daily while on heparin Continue to monitor H&H and platelets  Wyline Mood 03/19/2019,11:49 PM

## 2019-03-19 NOTE — Progress Notes (Signed)
Subjective: CC: Emergency department follow-up PCP: [name], DO BOF:BPZWC D Hassinger is a 53 y.o. male presenting to clinic today for:  1.  Syncope/splenic infarcts Patient was recently evaluated emergency department after having a near syncopal episode with associated chest pain.  His work-up was notable for splenic infarcts of unknown chronicity on CT.  His renal function was noted to be in decline from previous check.  Given his significant cardiovascular history and uncontrolled diabetes, amongst other findings there were plans for admission to the hospital.  However patient signed out AMA.  When we discussed this further today he notes that he was becoming irritated within the emergency department as he did not think he was getting adequate communication with regards to what he was being admitted for and therefore chose to leave AMA.  He has had no syncopal episode since his evaluation but does state that he has had worsening fatigue.   ROS: Per HPI  No Known Allergies Past Medical History:  Diagnosis Date  . Anxiety   . Asthma   . CKD stage 3 due to type 2 diabetes mellitus ([place])   . COPD (chronic obstructive pulmonary disease) ([place])   . Coronary artery disease    s/p BMS to Ramus 10/2010;  Cath 01/22/12 prox 30-40% LAD, LCx ramus w/ hazy 70-80% in-stent restenosis, EF 60% treated medically  . Coronary artery disease    s/p BMS to Ramus 10/2010;  Cath 01/22/12 prox 30-40% LAD, LCx ramus w/ hazy 70-80% in-stent restenosis, EF 60% treated medically   . Depression   . Diabetes mellitus    Type 2  . Gastroparesis 05/2013  . GERD (gastroesophageal reflux disease)   . HTN (hypertension)   . Hyperlipidemia   . Hyperlipidemia   . Major depression, chronic 08/13/2012  . Melanoma ([place]) 2007   surgery at [facility], Followed by [facility]  . Myocardial infarction ([place]) 2012  . Obesity   . Tobacco abuse   . Tubular adenoma   . Urinary retention     Current Outpatient  Medications:  .  albuterol (PROVENTIL HFA;VENTOLIN HFA) 108 (90 Base) MCG/ACT inhaler, Inhale 2 puffs into the lungs every 6 (six) hours as needed for wheezing., Disp: 1 Inhaler, Rfl: 2 .  albuterol (PROVENTIL) (2.5 MG/3ML) 0.083% nebulizer solution, Take 3 mLs (2.5 mg total) by nebulization every 6 (six) hours as needed for wheezing or shortness of breath., Disp: 75 mL, Rfl: 3 .  aspirin EC 81 MG tablet, Take 81 mg by mouth every evening. , Disp: , Rfl:  .  atorvastatin (LIPITOR) 80 MG tablet, Take 1 tablet by mouth once daily (Patient taking differently: Take 80 mg by mouth every morning. ), Disp: 90 tablet, Rfl: 0 .  busPIRone (BUSPAR) 7.5 MG tablet, Take 1 tablet (7.5 mg total) by mouth 2 (two) times daily., Disp: 60 tablet, Rfl: 2 .  fenofibrate (TRICOR) 145 MG tablet, Take 1 tablet (145 mg total) by mouth daily., Disp: 90 tablet, Rfl: 3 .  furosemide (LASIX) 20 MG tablet, Take 1 tablet (20 mg total) by mouth daily., Disp: 10 tablet, Rfl: 0 .  gabapentin (NEURONTIN) 600 MG tablet, Take 1 tablet (600 mg total) by mouth 3 (three) times daily. (Patient taking differently: Take 1,200 mg by mouth 2 (two) times daily. ), Disp: 270 tablet, Rfl: 1 .  Insulin Glargine (LANTUS SOLOSTAR) 100 UNIT/ML Solostar Pen, Inject 65-70 Units into the skin daily. (Patient taking differently: Inject 60 Units into the skin every evening. ), Disp:  21 mL, Rfl: 3 .  lisinopril (ZESTRIL) 5 MG tablet, TAKE 1 TABLET BY MOUTH AT BEDTIME (Patient taking differently: Take 5 mg by mouth at bedtime. ), Disp: 90 tablet, Rfl: 0 .  loratadine (CLARITIN) 10 MG tablet, Take 10 mg by mouth daily as needed for allergies., Disp: , Rfl:  .  omeprazole (PRILOSEC) 40 MG capsule, TAKE 1 CAPSULE BY MOUTH TWICE DAILY (Patient taking differently: TAKE 1 CAPSULE BY MOUTH at bedtime), Disp: 180 capsule, Rfl: 1 .  sertraline (ZOLOFT) 100 MG tablet, Take 1 tablet (100 mg total) by mouth daily., Disp: 90 tablet, Rfl: 3 .  tamsulosin (FLOMAX) 0.4 MG  CAPS capsule, Take 1 capsule (0.4 mg total) by mouth daily after breakfast., Disp: 30 capsule, Rfl: 0 .  traZODone (DESYREL) 150 MG tablet, TAKE 1 TABLET BY MOUTH ONCE DAILY AT BEDTIME (Patient taking differently: Take 150 mg by mouth at bedtime. ), Disp: 90 tablet, Rfl: 0 .  insulin aspart (NOVOLOG FLEXPEN) 100 UNIT/ML FlexPen, Inject 10 Units into the skin 3 (three) times daily with meals. Inject up to 10 units prior to meal per sliding scale. (Patient not taking: Reported on 03/17/2019), Disp: 15 mL, Rfl: 2 .  linaGLIPtin-metFORMIN HCl (JENTADUETO) 2.02-999 MG TABS, Take 1 tablet by mouth 2 (two) times daily with a meal. (Patient not taking: Reported on 03/17/2019), Disp: 180 tablet, Rfl: 0 .  nitroGLYCERIN (NITROSTAT) 0.4 MG SL tablet, Place 1 tablet (0.4 mg total) under the tongue every 5 (five) minutes as needed. For chest pain., Disp: 25 tablet, Rfl: 3 Social History   Socioeconomic History  . Marital status: Divorced    Spouse name: rayann Longino  . Number of children: 0  . Years of education: Not on file  . Highest education level: Not on file  Occupational History  . Occupation: disabled  Social Needs  . Financial resource strain: Somewhat hard  . Food insecurity:    Worry: Sometimes true    Inability: Never true  . Transportation needs:    Medical: No    Non-medical: Not on file  Tobacco Use  . Smoking status: Current Every Day Smoker    Packs/day: 1.00    Years: 31.00    [place] years: 31.00    Types: Cigarettes    Start date: 10/09/1981  . Smokeless tobacco: Never Used  . Tobacco comment: 1/2 [place] daily 11/01/17  Substance and Sexual Activity  . 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  . Drug use: No  . Sexual activity: Yes  Lifestyle  . Physical activity:    Days per week: 7 days    Minutes per session: 30 min  . Stress: Rather much  Relationships   . Social connections:    Talks on phone: More than three times a week    Gets together: More than three times a week    Attends religious service: Never    Active member of club or organization: No    Attends meetings of clubs or organizations: Never    Relationship status: Divorced  . Intimate partner violence:    Fear of current or ex partner: No    Emotionally abused: No    Physically abused: No    Forced sexual activity: No  Other Topics Concern  . Not on file  Social History Narrative   Lives at home in an  apartment alone. He is divorced and does not have any children. He has two brothers but is estranged from one. He only talks with the other one infrequently. He does have contact with his ex wife and they have a good relationship. They help each other out.    Family History  Problem Relation Age of Onset  . Stroke Mother   . Alcohol abuse Father   . Lung cancer Other   . Heart disease Other   . Other Other        not real familiar with family history  . Colon cancer Neg Hx   . Colon polyps Neg Hx     Objective: Office vital signs reviewed. BP 126/74   Pulse 88   Temp 98 F (36.7 C) (Oral)   Ht 6\' 2"  (1.88 m)   Wt 195 lb (88.5 kg)   BMI 25.04 kg/m   Physical Examination:  General: Awake, chronically ill appearing, diaphoretic HEENT: Sclera appear slightly jaundiced Pulmonary: Bibasilar wheezes appreciated.  Air movement fair.  Normal work of breathing on room air Cardio: Regular rate and rhythm.  No murmurs appreciated   Assessment/ Plan: 53 y.o. male   I reviewed his recent emergency department note and I am very concerned about his recent syncopal episode with associated acute on chronic renal failure and splenic infarcts.  I reiterated that these can be signs and symptoms of life-threatening illness and he certainly warrants further evaluation in the hospital.  We have contacted the [facility] emergency department triage nurse and informed her of patient's  needs.  He is on his way now.  Hopefully he can be directly admitted given work-up just a couple of days ago.  1. Arteriosclerotic cardiovascular disease (ASCVD)  2. Hypertension associated with diabetes ([place])  3. Near syncope  4. CKD stage 3 due to type 2 diabetes mellitus (H[facility])  5. Splenic infarction  6. Type 2 diabetes mellitus with diabetic polyneuropathy, with long-term current use of insulin ([place])   Orders Placed This Encounter  Procedures  . Basic Metabolic Panel  . CBC  . Bayer DCA Hb A1c Waived  . Ambulatory referral to Cardiology    Referral Priority:   Urgent    Referral Type:   Consultation    Referral Reason:   Specialty Services Required    Requested Specialty:   Cardiology    Number of Visits Requested:   1   No orders of the defined types were placed in this encounter.    [name], DO [place] [phone]

## 2019-03-19 NOTE — H&P (Addendum)
History and Physical    Dustin Cruz ZWC:[id number] DOB: 1966-06-03 DOA: 03/19/2019  PCP: [name], DO   Patient coming from: Home  I have personally briefly reviewed patient's old medical records in [place]  Chief Complaint: Chest Pain., weakness  HPI: Dustin Cruz is a 53 y.o. male with medical history significant for DM2, Depression, CKD3, CAD, COPD, presented to the Ed 2 days ago with c/o central chest pain that started while he was sitting in his truck, he subsequently felt like he was going to pass out. Chest pain radiated to his back. He reports associated difficulty breathing. Chest and Abdominal Ct was done with Contrast, negative for PE but showed multiple splenic infarcts. He also had an AKI and mildly elevated CK- 400,  Admission was recommended by patient left AMA. He saw his PCP today who recommended he come back to the ED.  Neither his Chest pain or the other symptoms have recurred since that day, and he denies ever having abdominal pain. Over the past 2 days he has had increasing weakness all over. He report tingling sensation in his bilat feet, but no focal weakness of his extremities.  ED Course: Stable vitals. Improved Cr 1.54 today from 2 two days ago. CK 245. EKG sinus with old LBBB. Hospitalist to admit for splenic infarct workup.  Review of Systems: As per HPI all other systems reviewed and negative.  Past Medical History:  Diagnosis Date  . Anxiety   . Asthma   . CKD stage 3 due to type 2 diabetes mellitus ([place])   . COPD (chronic obstructive pulmonary disease) ([place])   . Coronary artery disease    s/p BMS to Ramus 10/2010;  Cath 01/22/12 prox 30-40% LAD, LCx ramus w/ hazy 70-80% in-stent restenosis, EF 60% treated medically  . Coronary artery disease    s/p BMS to Ramus 10/2010;  Cath 01/22/12 prox 30-40% LAD, LCx ramus w/ hazy 70-80% in-stent restenosis, EF 60% treated medically   . Depression   . Diabetes mellitus    Type 2  . Gastroparesis  05/2013  . GERD (gastroesophageal reflux disease)   . HTN (hypertension)   . Hyperlipidemia   . Hyperlipidemia   . Major depression, chronic 08/13/2012  . Melanoma ([place]) 2007   surgery at [facility], Followed by [facility]  . Myocardial infarction ([place]) 2012  . Obesity   . Tobacco abuse   . Tubular adenoma   . Urinary retention     Past Surgical History:  Procedure Laterality Date  . BIOPSY  01/07/2018   Procedure: BIOPSY;  Surgeon: Daneil Dolin, MD;  Location: [facility] ENDO SU[facility];  Service: Endoscopy;;  duodenal and gastric biopsy  . BIOPSY  04/25/2018   Procedure: BIOPSY;  Surgeon: Daneil Dolin, MD;  Location: [facility];  Service: Endoscopy;;  ascending and descending and sigmoid biopsies  . CARDIAC CATHETERIZATION     with stent  . COLONOSCOPY N/A 01/27/2013   MPN:TIRWER and colonic polyps. Tubular adenomas, poor bowel prep, one-year follow-up surveillance colonoscopy recommended  . COLONOSCOPY WITH PROPOFOL N/A 04/25/2018   Procedure: COLONOSCOPY WITH PROPOFOL;  Surgeon: Daneil Dolin, MD;  Location: [facility];  Service: Endoscopy;  Laterality: N/A;  11:00am - pt to be prepped INPT and labs to be done as well  . CORONARY ANGIOPLASTY WITH STENT PLACEMENT    . ESOPHAGOGASTRODUODENOSCOPY  04/24/2012   [place]-mild erosive reflux esophagitis,dilated w/54F Venia Minks, small HH, minimal chronic gastric/bulbar erosions(No H pylori)  .  ESOPHAGOGASTRODUODENOSCOPY (EGD) WITH PROPOFOL N/A 01/07/2018   Dr. Gala Romney: Erythematous mucosa in the stomach, retained gastric contents, incomplete exam.  Biopsied showed mild chronic gastritis but no H. pylori.  Duodenal biopsy was negative for celiac disease.  [name] FLEXIBLE SIGMOIDOSCOPY N/A 01/07/2018   Dr. Gala Romney: Incomplete colonoscopy due to inadequate bowel prep  . LEFT HEART CATHETERIZATION WITH CORONARY ANGIOGRAM N/A 01/22/2012   Procedure: LEFT HEART CATHETERIZATION WITH CORONARY ANGIOGRAM;  Surgeon: Jolaine Artist, MD;  Location: [facility] CATH LAB;  Service:  Cardiovascular;  Laterality: N/A;  . melanoma surgery  2007   [place], removed lymph nodes under arm as well,Left abd     reports that he has been smoking cigarettes. He started smoking about 37 years ago. He has a 31.00 Juliana-year smoking history. He has never used smokeless tobacco. He reports that he does not drink alcohol or use drugs.  No Known Allergies  Family History  Problem Relation Age of Onset  . Stroke Mother   . Alcohol abuse Father   . Lung cancer Other   . Heart disease Other   . Other Other        not real familiar with family history  . Colon cancer Neg Hx   . Colon polyps Neg Hx     Prior to Admission medications   Medication Sig Start Date End Date Taking? Authorizing Provider  albuterol (PROVENTIL HFA;VENTOLIN HFA) 108 (90 Base) MCG/ACT inhaler Inhale 2 puffs into the lungs every 6 (six) hours as needed for wheezing. 01/16/19  Yes Gottschalk, Ashly M, DO  albuterol (PROVENTIL) (2.5 MG/3ML) 0.083% nebulizer solution Take 3 mLs (2.5 mg total) by nebulization every 6 (six) hours as needed for wheezing or shortness of breath. 10/15/17  Yes Timmothy Euler, MD  aspirin EC 81 MG tablet Take 81 mg by mouth every evening.    Yes [provider]  atorvastatin (LIPITOR) 80 MG tablet Take 1 tablet by mouth once daily Patient taking differently: Take 80 mg by mouth every morning.  01/08/19  Yes Gottschalk, Ashly M, DO  busPIRone (BUSPAR) 7.5 MG tablet Take 1 tablet (7.5 mg total) by mouth 2 (two) times daily. 11/22/18  Yes Gottschalk, Ashly M, DO  fenofibrate (TRICOR) 145 MG tablet Take 1 tablet (145 mg total) by mouth daily. 04/17/18  Yes Gottschalk, Leatrice Jewels M, DO  furosemide (LASIX) 20 MG tablet Take 1 tablet (20 mg total) by mouth daily. 10/07/17  Yes Julianne Rice, MD  gabapentin (NEURONTIN) 600 MG tablet Take 1 tablet (600 mg total) by mouth 3 (three) times daily. Patient taking differently: Take 1,200 mg by mouth 2 (two) times daily.  11/25/18  Yes Gottschalk, Leatrice Jewels M,  DO  Insulin Glargine (LANTUS SOLOSTAR) 100 UNIT/ML Solostar Pen Inject 65-70 Units into the skin daily. Patient taking differently: Inject 60 Units into the skin every evening.  11/22/18  Yes Gottschalk, Ashly M, DO  lisinopril (ZESTRIL) 5 MG tablet TAKE 1 TABLET BY MOUTH AT BEDTIME Patient taking differently: Take 5 mg by mouth at bedtime.  02/17/19  Yes Gottschalk, Leatrice Jewels M, DO  loratadine (CLARITIN) 10 MG tablet Take 10 mg by mouth daily as needed for allergies.   Yes [provider]  nitroGLYCERIN (NITROSTAT) 0.4 MG SL tablet Place 1 tablet (0.4 mg total) under the tongue every 5 (five) minutes as needed. For chest pain. 08/13/12  Yes Reece Packer, NP  omeprazole (PRILOSEC) 40 MG capsule TAKE 1 CAPSULE BY MOUTH TWICE DAILY Patient taking differently: TAKE 1 CAPSULE BY MOUTH  at bedtime 04/09/18  Yes Gottschalk, Ashly M, DO  sertraline (ZOLOFT) 100 MG tablet Take 1 tablet (100 mg total) by mouth daily. 09/21/17  Yes Timmothy Euler, MD  tamsulosin (FLOMAX) 0.4 MG CAPS capsule Take 1 capsule (0.4 mg total) by mouth daily after breakfast. 08/11/18  Yes Pollina, Gwenyth Allegra, MD  traZODone (DESYREL) 150 MG tablet TAKE 1 TABLET BY MOUTH ONCE DAILY AT BEDTIME Patient taking differently: Take 150 mg by mouth at bedtime.  01/14/19  Yes Gottschalk, Ashly M, DO  insulin aspart (NOVOLOG FLEXPEN) 100 UNIT/ML FlexPen Inject 10 Units into the skin 3 (three) times daily with meals. Inject up to 10 units prior to meal per sliding scale. Patient not taking: Reported on 03/17/2019 09/21/17   Timmothy Euler, MD  linaGLIPtin-metFORMIN HCl (JENTADUETO) 2.02-999 MG TABS Take 1 tablet by mouth 2 (two) times daily with a meal. Patient not taking: Reported on 03/17/2019 10/10/18   [name], DO  metoprolol tartrate (LOPRESSOR) 25 MG tablet Take 25 mg by mouth 2 (two) times daily.    12/27/11  [provider]    Physical Exam: Vitals:   03/19/19 1547 03/19/19 1923 03/19/19 2104 03/19/19 2134   BP:  (!) 152/84 132/75 138/82  Pulse:  92 65 64  Resp:  17 18 17   Temp:  97.9 F (36.6 C) 97.8 F (36.6 C) 97.8 F (36.6 C)  TempSrc:  Oral Oral   SpO2:  100% 100% 100%  Weight: 88.5 kg     Height: 6\' 2"  (1.88 m)       Constitutional: NAD, calm, comfortable Vitals:   03/19/19 1547 03/19/19 1923 03/19/19 2104 03/19/19 2134  BP:  (!) 152/84 132/75 138/82  Pulse:  92 65 64  Resp:  17 18 17   Temp:  97.9 F (36.6 C) 97.8 F (36.6 C) 97.8 F (36.6 C)  TempSrc:  Oral Oral   SpO2:  100% 100% 100%  Weight: 88.5 kg     Height: 6\' 2"  (1.88 m)      Eyes: PERRL, lids and conjunctivae normal ENMT: Mucous membranes are moist. Posterior pharynx clear of any exudate or lesions. Neck: normal, supple, no masses, no thyromegaly Respiratory: clear to auscultation bilaterally, no wheezing, no crackles. Normal respiratory effort. No accessory muscle use.  Cardiovascular: Regular rate and rhythm, no murmurs / rubs / gallops. No extremity edema. 2+ pedal pulses.  Abdomen: no tenderness, no masses palpated. No hepatosplenomegaly. Bowel sounds positive.  Musculoskeletal: no clubbing / cyanosis. No joint deformity upper and lower extremities. Good ROM, no contractures. Normal muscle tone.  Skin: no rashes, lesions, ulcers. No induration Neurologic: CN 2-12 grossly intact. Sensation impaired bilat feet to lower legs. Strength 5/5 in all 4.  Psychiatric: Normal judgment and insight. Alert and oriented x 3. Normal mood.   Labs on Admission: I have personally reviewed following labs and imaging studies  CBC: Recent Labs  Lab 03/17/19 1417 03/19/19 1622  WBC 10.8* 8.1  NEUTROABS  --  4.9  HGB 14.5 13.0  HCT 43.1 39.7  MCV 94.7 96.6  PLT 328 245   Basic Metabolic Panel: Recent Labs  Lab 03/17/19 1412 03/17/19 1417 03/19/19 1622  NA  --  131* 134*  K  --  4.7 4.3  CL  --  94* 101  CO2  --  26 23  GLUCOSE  --  390* 322*  BUN  --  26* 16  CREATININE 2.00* 2.09* 1.54*  CALCIUM  --  9.6  9.0  Liver Function Tests: Recent Labs  Lab 03/17/19 1417 03/19/19 1622  AST 19 20  ALT 24 23  ALKPHOS 36* 36*  BILITOT 0.7 0.4  PROT 7.3 6.4*  ALBUMIN 4.1 3.7   Cardiac Enzymes: Recent Labs  Lab 03/17/19 1417 03/19/19 1622  CKTOTAL 400* 245  TROPONINI <0.03  --    CBG: Recent Labs  Lab 03/17/19 1326 03/17/19 1757  GLUCAP 419* 232*    Radiological Exams on Admission: No results found.   CT angiogram abdomen; CT angiogram pelvis:  1. Multiple splenic infarcts. Etiology for these infarcts is uncertain. Splenic artery appears patent without appreciable obstructive disease. There is modest atherosclerotic irregularity at the origin of the celiac artery. There is only minimal aortic atherosclerosis proximal to the level of the splenic artery. Consider cardiac etiology for these infarcts. Note that no apparent cardiac lesion to account for these findings is seen by CT.  2. There is distal abdominal aortic atherosclerosis without hemodynamically significant obstruction. There are multiple foci of pelvic arterial vascular calcification without hemodynamically significant obstructions. No mesenteric atherosclerosis beyond slight atherosclerosis at the origin of the celiac artery. No aneurysm or dissection involving aorta, major mesenteric, and major pelvic arterial vessels. No fibromuscular dysplasia.  3.      Hepatic steatosis.  4. No bowel obstruction or evidence of bowel ischemia. No abscess in the abdomen or pelvis. Appendix region appears normal.  5. No evident renal or ureteral calculus. No hydronephrosis. Urinary bladder wall thickness is within normal limits.   CT angiogram chest:  1. No thoracic aortic aneurysm or dissection. There is slight aortic atherosclerotic calcification in the ascending aorta near the aortic valve. There is slight atherosclerotic calcification at the origin of the right subclavian artery. There are foci of coronary  artery calcification.  2.  No evident pulmonary embolus.  3.  Slight bibasilar atelectasis.  Lungs elsewhere clear.  4.  No evident thoracic adenopathy.  EKG: Independently reviewed. Sinus. Old LBBB. No ST or T wave changes.  Assessment/Plan Active Problems:   Splenic infarct    Splenic Infarct- Ct abd W contrast - Multiple splenic infarcts. Etiology for these infarcts is uncertain. Splenic artery appears patent without appreciable obstructive disease. Consider Cardiac etiology for these infarcts. - Will start Heparin Gtt per Pharm - Cardiology consult, patient may benefit from TEE - NPO midnight - Will get Hypercoagulable panel prior to initiating anticoagulation.  Chest Pain with near syncope- 2 days ago, since resolved. Symptoms likely related to splenic infarct. Chest CT negative for PE or other acute abnormality. - Trend Trop - F/u TEE, deferred TTE. - Magnesium  DM2 with retinopathy and neuropathy-  - SSI - Cont gabapentin - Cont home lantus at reduced dose 20u daily  COPD, Asthma- Stable - Cont home inhalers.  Tobacco Abuse- 1/2 to 1 PPD. - Nicotine patch  Depression, Anxiety-  - Cont Home Buspar, Sertraline, Trazodone   DVT prophylaxis: heparin Code Status: Full Family Communication: None at bedside. Called patients significant order as patient requested to inform her of patients hospitalization and the plan. She requests that she be updated also as patient sometimes does not understand details about his health.  Disposition Plan: Per rounding team Consults called: Cardiology Admission status: Inpt, Tele I certify that at the point of admission it is my clinical judgment that the patient will require inpatient hospital care spanning beyond 2 midnights from the point of admission due to high intensity of service, high risk for further deterioration and high frequency of  surveillance required. The following factors support the patient status of inpatient:  Splenic infarcts requiring Anticoagulation and possibly TEE.   Bethena Roys MD [facility]  03/19/2019, 11:15 PM

## 2019-03-19 NOTE — ED Triage Notes (Signed)
Pt was seen at the ED on 03/17/19 for LOC. Pt was suppose to be admitted for kidney failure and infarcts in his spleen but signed out AMA. Pt willing to be admitted now for further workup.

## 2019-03-20 ENCOUNTER — Encounter (HOSPITAL_COMMUNITY): Payer: Self-pay | Admitting: Anesthesiology

## 2019-03-20 ENCOUNTER — Telehealth: Payer: Medicare Other | Admitting: Cardiovascular Disease

## 2019-03-20 ENCOUNTER — Encounter (HOSPITAL_COMMUNITY): Payer: Self-pay | Admitting: *Deleted

## 2019-03-20 DIAGNOSIS — D735 Infarction of spleen: Principal | ICD-10-CM

## 2019-03-20 DIAGNOSIS — I10 Essential (primary) hypertension: Secondary | ICD-10-CM | POA: Diagnosis present

## 2019-03-20 LAB — GLUCOSE, CAPILLARY
Glucose-Capillary: 131 mg/dL — ABNORMAL HIGH (ref 70–99)
Glucose-Capillary: 157 mg/dL — ABNORMAL HIGH (ref 70–99)
Glucose-Capillary: 170 mg/dL — ABNORMAL HIGH (ref 70–99)
Glucose-Capillary: 193 mg/dL — ABNORMAL HIGH (ref 70–99)
Glucose-Capillary: 240 mg/dL — ABNORMAL HIGH (ref 70–99)
Glucose-Capillary: 263 mg/dL — ABNORMAL HIGH (ref 70–99)

## 2019-03-20 LAB — CBC
HCT: 38.2 % — ABNORMAL LOW (ref 39.0–52.0)
Hemoglobin: 12.6 g/dL — ABNORMAL LOW (ref 13.0–17.0)
MCH: 32 pg (ref 26.0–34.0)
MCHC: 33 g/dL (ref 30.0–36.0)
MCV: 97 fL (ref 80.0–100.0)
Platelets: 322 10*3/uL (ref 150–400)
RBC: 3.94 MIL/uL — ABNORMAL LOW (ref 4.22–5.81)
RDW: 13 % (ref 11.5–15.5)
WBC: 6.9 10*3/uL (ref 4.0–10.5)
nRBC: 0 % (ref 0.0–0.2)

## 2019-03-20 LAB — HEPARIN LEVEL (UNFRACTIONATED)
Heparin Unfractionated: <0.1 [IU]/mL — ABNORMAL LOW (ref 0.30–0.70)
Heparin Unfractionated: <0.1 IU/mL — ABNORMAL LOW (ref 0.30–0.70)

## 2019-03-20 LAB — TROPONIN I
Troponin I: <0.03 ng/mL (ref <0.03)
Troponin I: <0.03 ng/mL

## 2019-03-20 LAB — PROTIME-INR
INR: 0.9 (ref 0.8–1.2)
Prothrombin Time: 12 seconds (ref 11.4–15.2)

## 2019-03-20 LAB — ANTITHROMBIN III: AntiThromb III Func: 100 % (ref 75–120)

## 2019-03-20 LAB — APTT: aPTT: 26 s (ref 24–36)

## 2019-03-20 LAB — MAGNESIUM: Magnesium: 1.8 mg/dL (ref 1.7–2.4)

## 2019-03-20 MED ORDER — HEPARIN BOLUS VIA INFUSION
2500.0000 [IU] | Freq: Once | INTRAVENOUS | Status: AC
  Administered 2019-03-20: 22:00:00 2500 [IU] via INTRAVENOUS
  Filled 2019-03-20: qty 2500

## 2019-03-20 MED ORDER — INSULIN GLARGINE 100 UNIT/ML ~~LOC~~ SOLN
20.0000 [IU] | Freq: Every day | SUBCUTANEOUS | Status: DC
  Filled 2019-03-20 (×3): qty 0.2

## 2019-03-20 MED ORDER — HEPARIN BOLUS VIA INFUSION
2500.0000 [IU] | Freq: Once | INTRAVENOUS | Status: AC
  Administered 2019-03-20: 2500 [IU] via INTRAVENOUS
  Filled 2019-03-20: qty 2500

## 2019-03-20 MED ORDER — GABAPENTIN 300 MG PO CAPS
600.0000 mg | ORAL_CAPSULE | Freq: Three times a day (TID) | ORAL | Status: DC
  Administered 2019-03-20 – 2019-03-21 (×5): 600 mg via ORAL
  Filled 2019-03-20 (×5): qty 2

## 2019-03-20 NOTE — Progress Notes (Signed)
ANTICOAGULATION CONSULT NOTE -   Pharmacy Consult for Heparin Indication: splenic infarcts  No Known Allergies  Patient Measurements: Height: 6\' 2"  (188 cm) Weight: 195 lb (88.5 kg) IBW/kg (Calculated) : 82.2 Heparin Dosing Weight: 88.5 kg  Vital Signs: Temp: 98.1 F (36.7 C) (06/11 0528) BP: 118/64 (06/11 0528) Pulse Rate: 79 (06/11 0528)  Labs: Recent Labs    03/17/19 1412  03/17/19 1417 03/19/19 1622 03/19/19 2325 03/19/19 2328 03/20/19 0540 03/20/19 0639  HGB  --    < > 14.5 13.0  --   --  12.6*  --   HCT  --   --  43.1 39.7  --   --  38.2*  --   PLT  --   --  328 333  --   --  322  --   APTT  --   --   --   --   --  26  --   --   LABPROT  --   --   --   --   --  12.0  --   --   INR  --   --   --   --   --  0.9  --   --   HEPARINUNFRC  --   --   --   --   --   --   --  <0.10*  CREATININE 2.00*  --  2.09* 1.54*  --   --   --   --   CKTOTAL  --   --  400* 245  --   --   --   --   TROPONINI  --   --  <0.03  --  <0.03  --  <0.03  --    < > = values in this interval not displayed.    Estimated Creatinine Clearance: 64.5 mL/min (A) (by C-G formula based on SCr of 1.54 mg/dL (H)).   Medical History: Past Medical History:  Diagnosis Date  . Anxiety   . Asthma   . CKD stage 3 due to type 2 diabetes mellitus ([place])   . COPD (chronic obstructive pulmonary disease) ([place])   . Coronary artery disease    s/p BMS to [facility] 10/2010;  Cath 01/22/12 prox 30-40% LAD, LCx ramus w/ hazy 70-80% in-stent restenosis, EF 60% treated medically  . Coronary artery disease    s/p BMS to [facility] 10/2010;  Cath 01/22/12 prox 30-40% LAD, LCx ramus w/ hazy 70-80% in-stent restenosis, EF 60% treated medically   . Depression   . Diabetes mellitus    Type 2  . Gastroparesis 05/2013  . GERD (gastroesophageal reflux disease)   . HTN (hypertension)   . Hyperlipidemia   . Hyperlipidemia   . Major depression, chronic 08/13/2012  . Melanoma ([place]) 2007   surgery at [facility], Followed by [facility]  .  Myocardial infarction ([place]) 2012  . Obesity   . Tobacco abuse   . Tubular adenoma   . Urinary retention     Medications:  Medications Prior to Admission  Medication Sig Dispense Refill Last Dose  . albuterol (PROVENTIL HFA;VENTOLIN HFA) 108 (90 Base) MCG/ACT inhaler Inhale 2 puffs into the lungs every 6 (six) hours as needed for wheezing. 1 Inhaler 2 unknown  . albuterol (PROVENTIL) (2.5 MG/3ML) 0.083% nebulizer solution Take 3 mLs (2.5 mg total) by nebulization every 6 (six) hours as needed for wheezing or shortness of breath. 75 mL 3 Past Week at Unknown time  . aspirin EC 81 MG  tablet Take 81 mg by mouth every evening.    03/18/2019 at Unknown time  . atorvastatin (LIPITOR) 80 MG tablet Take 1 tablet by mouth once daily (Patient taking differently: Take 80 mg by mouth every morning. ) 90 tablet 0 03/19/2019 at Unknown time  . busPIRone (BUSPAR) 7.5 MG tablet Take 1 tablet (7.5 mg total) by mouth 2 (two) times daily. 60 tablet 2 03/19/2019 at Unknown time  . fenofibrate (TRICOR) 145 MG tablet Take 1 tablet (145 mg total) by mouth daily. 90 tablet 3 03/19/2019 at Unknown time  . furosemide (LASIX) 20 MG tablet Take 1 tablet (20 mg total) by mouth daily. 10 tablet 0 03/19/2019 at Unknown time  . gabapentin (NEURONTIN) 600 MG tablet Take 1 tablet (600 mg total) by mouth 3 (three) times daily. (Patient taking differently: Take 1,200 mg by mouth 2 (two) times daily. ) 270 tablet 1 03/19/2019 at Unknown time  . Insulin Glargine (LANTUS SOLOSTAR) 100 UNIT/ML Solostar Pen Inject 65-70 Units into the skin daily. (Patient taking differently: Inject 60 Units into the skin every evening. ) 21 mL 3 03/18/2019 at Unknown time  . lisinopril (ZESTRIL) 5 MG tablet TAKE 1 TABLET BY MOUTH AT BEDTIME (Patient taking differently: Take 5 mg by mouth at bedtime. ) 90 tablet 0 Past Week at Unknown time  . loratadine (CLARITIN) 10 MG tablet Take 10 mg by mouth daily as needed for allergies.   Past Month at Unknown time  .  nitroGLYCERIN (NITROSTAT) 0.4 MG SL tablet Place 1 tablet (0.4 mg total) under the tongue every 5 (five) minutes as needed. For chest pain. 25 tablet 3 unknown  . omeprazole (PRILOSEC) 40 MG capsule TAKE 1 CAPSULE BY MOUTH TWICE DAILY (Patient taking differently: TAKE 1 CAPSULE BY MOUTH at bedtime) 180 capsule 1 03/18/2019 at Unknown time  . sertraline (ZOLOFT) 100 MG tablet Take 1 tablet (100 mg total) by mouth daily. 90 tablet 3 03/19/2019 at Unknown time  . tamsulosin (FLOMAX) 0.4 MG CAPS capsule Take 1 capsule (0.4 mg total) by mouth daily after breakfast. 30 capsule 0 03/19/2019 at Unknown time  . traZODone (DESYREL) 150 MG tablet TAKE 1 TABLET BY MOUTH ONCE DAILY AT BEDTIME (Patient taking differently: Take 150 mg by mouth at bedtime. ) 90 tablet 0 03/18/2019 at Unknown time  . insulin aspart (NOVOLOG FLEXPEN) 100 UNIT/ML FlexPen Inject 10 Units into the skin 3 (three) times daily with meals. Inject up to 10 units prior to meal per sliding scale. (Patient not taking: Reported on 03/17/2019) 15 mL 2   . linaGLIPtin-metFORMIN HCl (JENTADUETO) 2.02-999 MG TABS Take 1 tablet by mouth 2 (two) times daily with a meal. (Patient not taking: Reported on 03/17/2019) 180 tablet 0    Scheduled:  . atorvastatin  80 mg Oral q morning - 10a  . busPIRone  7.5 mg Oral BID  . fenofibrate  160 mg Oral Daily  . gabapentin  600 mg Oral TID  . insulin aspart  0-9 Units Subcutaneous Q4H  . insulin glargine  20 Units Subcutaneous Daily  . nicotine  14 mg Transdermal Once  . pantoprazole  40 mg Oral Daily  . sertraline  100 mg Oral Daily  . tamsulosin  0.4 mg Oral QPC breakfast  . traZODone  150 mg Oral QHS   Infusions:  . heparin 1,050 Units/hr (03/19/19 2350)   PRN: acetaminophen **OR** acetaminophen, albuterol, ondansetron **OR** ondansetron (ZOFRAN) IV, polyethylene glycol  Assessment:  Dustin Cruz is a 53 y.o. male  with medical history significant for DM2, Depression, CKD3, CAD, COPD, presented to the [facility] 2 days  ago with c/o central chest pain that started while he was sitting in his truck, he subsequently felt like he was going to pass out. Chest pain radiated to his back. He reports associated difficulty breathing. Chest and Abdominal Ct was done with Contrast, negative for PE but showed multiple splenic infarcts. He also had an AKI and mildly elevated CK- 400  Goal of Therapy:  Heparin level 0.3-0.7 units/ml Monitor platelets by anticoagulation protocol: Yes   Plan:  Rebolus 2500 units x 1 Increase heparin infusion to 1350 units/hr Check anti-Xa level in 6 hours and daily while on heparin Continue to monitor H&H and platelets.  Margot Ables, PharmD Clinical Pharmacist 03/20/2019 10:14 AM

## 2019-03-20 NOTE — Progress Notes (Addendum)
    [facility] has been requested to perform a transesophageal echocardiogram on Dustin Cruz for splenic infarcts. After careful review of history and examination, the risks and benefits of transesophageal echocardiogram have been explained including risks of esophageal damage, perforation (1:10,000 risk), bleeding, pharyngeal hematoma as well as other potential complications associated with conscious sedation including aspiration, arrhythmia, respiratory failure and death. Alternatives to treatment were discussed, questions were answered. Patient is willing to proceed.   TEE was initially requested for today but TTE has not yet been performed. Reviewed with Dr. Domenic Polite and will plan for TTE today and schedule TEE for 03/21/2019 if transthoracic echocardiogram unrevealing. Scheduled for 03/21/2019 at 10:30AM.   Called the patient's significant other Dustin Cruz) at his request to review the procedure as well. She was thankful for the update.   Dustin Heritage, PA-C  03/20/2019 11:06 AM    Attending note:  TEE requested by Dr. Denton Brick to evaluate for possible cardiac source of embolus in light of multiple splenic infarcts by CT imaging.  He has not yet had a transthoracic echocardiogram which is ordered and pending.  Currently the imaging studies are being delayed until his SARS coronavirus 2 test returns (sonographer was given these instructions by their supervisor in the echocardiography department).  We will tentatively schedule the TEE for tomorrow.  Satira Sark, M.D., F.A.C.C.

## 2019-03-20 NOTE — Progress Notes (Signed)
Patient Demographics:    Dustin Cruz, is a 53 y.o. male, DOB - 1965/12/08, EAV:[id number]  Admit date - 03/19/2019   Admitting Physician Bethena Roys, MD  Outpatient Primary MD for the patient is [name], DO  LOS - 1   Chief Complaint  Patient presents with   Loss of Consciousness        Subjective:    Dustin Cruz today has no fevers, no emesis,  No chest pain, no shortness of breath  Assessment  & Plan :    Principal Problem:   Splenic infarct Active Problems:   Diabetes mellitus, type 2 ([place])   Essential HTN (hypertension)   Arteriosclerotic cardiovascular disease (ASCVD)   Tobacco abuse   Hyperlipidemia  Brief summary 53 year old with past medical history relevant for CKD 3, COPD, CAD, DM, depressive disorder who was admitted with splenic infarcts on 03/19/2019 currently on IV heparin pending further investigations   1)DVT/splenic infarcts--- unknown etiology,, hypercoagulability work-up ordered and pending discussed with cardiology team, continue IV heparin, TTE pending, will most likely need TEE in a.m.  2)CKD-III--- creatinine currently around 1.5 which is around patient's baseline  3)CAD--- currently chest pain-free, no ACS symptoms prior angioplasty with stenting, currently on IV heparin, continue Lipitor 80 mg, fenofibrate cardiology input appreciated  4)DM2-A1c was 9.5 in February 2020 reflecting uncontrolled diabetes, continue Lantus 20 units daily, however hold Lantus in a.m. to allow for possible TEE as patient will be n.p.o. use Novolog/Humalog Sliding scale insulin with Accu-Cheks/Fingersticks as ordered   5) tobacco abuse--- cessation advised, continue nicotine patch  6) COPD--stable, no acute exacerbation, continue bronchodilators  7)depression/anxiety--- stable, continue trazodone BuSpar and Zoloft,  Disposition/Need for in-Hospital Stay- patient unable  to be discharged at this time due to DVT/splenic infarcts of unknown etiology, currently on IV heparin awaiting further investigation including possible TEE  Code Status : Full   Family Communication:   Girlfriend/significant other   Disposition Plan  : To be determined  Consults  : cardiology  DVT Prophylaxis  : IV heparin  Lab Results  Component Value Date   PLT 322 03/20/2019    Inpatient Medications  Scheduled Meds:  atorvastatin  80 mg Oral q morning - 10a   busPIRone  7.5 mg Oral BID   fenofibrate  160 mg Oral Daily   gabapentin  600 mg Oral TID   insulin aspart  0-9 Units Subcutaneous Q4H   insulin glargine  20 Units Subcutaneous Daily   nicotine  14 mg Transdermal Once   pantoprazole  40 mg Oral Daily   sertraline  100 mg Oral Daily   tamsulosin  0.4 mg Oral QPC breakfast   traZODone  150 mg Oral QHS   Continuous Infusions:  heparin 1,350 Units/hr (03/20/19 1504)   PRN Meds:.acetaminophen **OR** acetaminophen, albuterol, ondansetron **OR** ondansetron (ZOFRAN) IV, polyethylene glycol    Anti-infectives (From admission, onward)   None        Objective:   Vitals:   03/19/19 2104 03/19/19 2134 03/20/19 0528 03/20/19 1245  BP: 132/75 138/82 118/64 128/85  Pulse: 65 64 79 83  Resp: 18 17 16 16   Temp: 97.8 F (36.6 C) 97.8 F (36.6 C) 98.1 F (36.7 C) 98 F (36.7 C)  TempSrc:  Oral   Oral  SpO2: 100% 100% 96% 100%  Weight:      Height:        Wt Readings from Last 3 Encounters:  03/19/19 88.5 kg  03/19/19 88.5 kg  03/17/19 83.9 kg     Intake/Output Summary (Last 24 hours) at 03/20/2019 1603 Last data filed at 03/20/2019 1504 Gross per 24 hour  Intake 437.67 ml  Output --  Net 437.67 ml    Physical Exam Patient is examined daily including today on 03/20/2019 , exams remain the same as of yesterday except that has changed   Gen:- Awake Alert,  In no apparent distress  HEENT:- [place].AT, No sclera icterus Neck-Supple Neck,No JVD,.   Lungs-  CTAB , fair symmetrical air movement CV- S1, S2 normal, regular  Abd-  +ve B.Sounds, Abd Soft, No tenderness,    Extremity/Skin:- No  edema, pedal pulses present  Psych-affect is appropriate, oriented x3 Neuro-no new focal deficits, no tremors   Data Review:   Micro Results Recent Results (from the past 240 hour(s))  SARS Coronavirus 2 (CEPHEID - Performed in [place] hospital lab), Hosp Order     Status: None   Collection Time: 03/17/19  4:04 PM   Specimen: Nasopharyngeal Swab  Result Value Ref Range Status   SARS Coronavirus 2 NEGATIVE NEGATIVE Final    Comment: (NOTE) If result is NEGATIVE SARS-CoV-2 target nucleic acids are NOT DETECTED. The SARS-CoV-2 RNA is generally detectable in upper and lower  respiratory specimens during the acute phase of infection. The lowest  concentration of SARS-CoV-2 viral copies this assay can detect is 250  copies / mL. A negative result does not preclude SARS-CoV-2 infection  and should not be used as the sole basis for treatment or other  patient management decisions.  A negative result may occur with  improper specimen collection / handling, submission of specimen other  than nasopharyngeal swab, presence of viral mutation(s) within the  areas targeted by this assay, and inadequate number of viral copies  (<250 copies / mL). A negative result must be combined with clinical  observations, patient history, and epidemiological information. If result is POSITIVE SARS-CoV-2 target nucleic acids are DETECTED. The SARS-CoV-2 RNA is generally detectable in upper and lower  respiratory specimens dur ing the acute phase of infection.  Positive  results are indicative of active infection with SARS-CoV-2.  Clinical  correlation with patient history and other diagnostic information is  necessary to determine patient infection status.  Positive results do  not rule out bacterial infection or co-infection with other viruses. If result is  PRESUMPTIVE POSTIVE SARS-CoV-2 nucleic acids MAY BE PRESENT.   A presumptive positive result was obtained on the submitted specimen  and confirmed on repeat testing.  While 2019 novel coronavirus  (SARS-CoV-2) nucleic acids may be present in the submitted sample  additional confirmatory testing may be necessary for epidemiological  and / or clinical management purposes  to differentiate between  SARS-CoV-2 and other Sarbecovirus currently known to infect humans.  If clinically indicated additional testing with an alternate test  methodology [phone]) is advised. The SARS-CoV-2 RNA is generally  detectable in upper and lower respiratory sp ecimens during the acute  phase of infection. The expected result is Negative. Fact Sheet for Patients:  StrictlyIdeas.no Fact Sheet for Healthcare Providers: BankingDealers.co.za This test is not yet approved or cleared by the [place] FDA and has been authorized for detection and/or diagnosis of SARS-CoV-2 by FDA under an Emergency Use Authorization (  EUA).  This EUA will remain in effect (meaning this test can be used) for the duration of the COVID-19 declaration under Section 564(b)(1) of the Act, 21 U.S.C. section 360bbb-3(b)(1), unless the authorization is terminated or revoked sooner. Performed at [facility], [address], [place], [place] [address]     Radiology Reports Dg Chest Portable 1 View  Result Date: 03/17/2019 CLINICAL DATA:  Chest pain.Patient was working outside. Became lightheaded and feeling hot. Chest and neck pain EXAM: PORTABLE CHEST 1 VIEW COMPARISON:  03/17/2019 FINDINGS: Normal heart size. No pleural effusion or edema. No airspace opacities. The lungs are hyperinflated with coarsened interstitial markings of emphysema. Visualized osseous structures are unremarkable. IMPRESSION: 1. No acute cardiopulmonary abnormalities. Electronically Signed   By: Kerby Moors M.D.   On:  03/17/2019 14:53   Ct Angio Chest/abd/pel For Dissection W And/or W/wo  Result Date: 03/17/2019 CLINICAL DATA:  Chest pain and abdominal pain EXAM: CT ANGIOGRAPHY CHEST, ABDOMEN AND PELVIS TECHNIQUE: Initially, axial CT images were obtained through the chest without intravenous contrast material administration. Multidetector CT imaging through the chest, abdomen and pelvis was performed using the standard protocol during bolus administration of intravenous contrast. Multiplanar reconstructed images and MIPs were obtained and reviewed to evaluate the vascular anatomy. CONTRAST:  17mL OMNIPAQUE IOHEXOL 350 MG/ML SOLN COMPARISON:  CT abdomen and pelvis August 06, 2018. FINDINGS: CTA CHEST FINDINGS Cardiovascular: There is no intramural hematoma in the aorta on noncontrast enhanced study. There is no demonstrable thoracic aortic aneurysm or dissection. Visualized great vessels appear normal except for slight calcification at the origin of the right subclavian artery. There is no demonstrable pulmonary embolus. There is a minimal pericardial effusion which may be within the physiologic range. There is no pericardial thickening. There are foci of coronary artery calcification. There is slight aortic atherosclerosis near the aortic valve. Mediastinum/Nodes: Thyroid appears unremarkable. There is no demonstrable thoracic adenopathy. No esophageal lesions are evident. Lungs/Pleura: There is slight bibasilar atelectatic change. Lungs elsewhere are clear. No pleural effusion or pleural thickening evident. Musculoskeletal: No blastic or lytic bone lesions. No evident fracture or dislocation. No chest wall lesions are evident. Review of the MIP images confirms the above findings. CTA ABDOMEN AND PELVIS FINDINGS VASCULAR Aorta: There is no abdominal aortic aneurysm or dissection. There are foci of aortic atherosclerotic calcification inferior to the renal arteries with mild plaque in this area. There is no hemodynamically  significant obstructive disease in the aorta. Celiac: There is mild atherosclerotic plaque at the origin of the celiac artery without hemodynamically significant obstruction. No other atherosclerotic plaque is noted in the celiac artery branches. In particular, this splenic artery appears unremarkable. No aneurysm or dissection involving the celiac artery or its branches. SMA: The superior mesenteric artery and its branches appear widely patent. No aneurysm or dissection evident. Renals: There is a single renal artery on the left. There is a single renal artery arising from the aorta which almost immediately branches into 2 adjacent renal arteries. There is no appreciable vessel narrowing or obstructive disease in either renal artery or branches. No fibromuscular dysplasia evident. No aneurysm or dissection. IMA: So inferior mesenteric artery and its branches appear patent. No aneurysm or dissection involving these branches. Inflow: There is moderate calcification in each common iliac artery, slightly more on the left than on the right. No hemodynamically significant obstruction is evident in either common iliac artery. There are areas of patchy atherosclerotic plaque in the left external iliac artery without hemodynamically significant obstruction. There  is moderate atherosclerotic plaque in each internal iliac artery without hemodynamically significant obstruction. There is mild plaque in each common femoral artery without hemodynamically significant obstruction. The proximal superficial femoral and profunda femoral arteries appear widely patent. No aneurysm or dissection involving pelvic arterial vascular structures. Veins: No obvious venous abnormality within the limitations of this arterial phase study. Review of the MIP images confirms the above findings. NON-VASCULAR Hepatobiliary: There is hepatic steatosis. No focal liver lesions are evident. Gallbladder wall is not appreciably thickened. There is no  biliary duct dilatation. Pancreas: No pancreatic mass or inflammatory focus. Spleen: There are peripheral splenic infarcts throughout the spleen. Spleen otherwise appears unremarkable. Spleen appears normal in size and contour. Adrenals/Urinary Tract: Adrenals bilaterally appear unremarkable. Kidneys bilaterally show no evident mass or hydronephrosis on either side. There is no evident renal or ureteral calculus on either side. Urinary bladder is midline with wall thickness within normal limits. Stomach/Bowel: There is no appreciable bowel wall or mesenteric thickening. There is no evident bowel obstruction. There is no free air or portal venous air. Terminal ileum appears normal. Lymphatic: There is no demonstrable adenopathy in the abdomen or pelvis. Reproductive: Prostate and seminal vesicles appear normal in size and contour. No pelvic mass evident. Other: Appendix appears normal. There is no abscess or ascites in the abdomen or pelvis. Musculoskeletal: There is degenerative change in the lumbar spine. There are no blastic or lytic bone lesions. There is no intramuscular or abdominal wall lesion. Review of the MIP images confirms the above findings. IMPRESSION: CT angiogram chest: 1. No thoracic aortic aneurysm or dissection. There is slight aortic atherosclerotic calcification in the ascending aorta near the aortic valve. There is slight atherosclerotic calcification at the origin of the right subclavian artery. There are foci of coronary artery calcification. 2.  No evident pulmonary embolus. 3.  Slight bibasilar atelectasis.  Lungs elsewhere clear. 4.  No evident thoracic adenopathy. CT angiogram abdomen; CT angiogram pelvis: 1. Multiple splenic infarcts. Etiology for these infarcts is uncertain. Splenic artery appears patent without appreciable obstructive disease. There is modest atherosclerotic irregularity at the origin of the celiac artery. There is only minimal aortic atherosclerosis proximal to the  level of the splenic artery. Consider cardiac etiology for these infarcts. Note that no apparent cardiac lesion to account for these findings is seen by CT. 2. There is distal abdominal aortic atherosclerosis without hemodynamically significant obstruction. There are multiple foci of pelvic arterial vascular calcification without hemodynamically significant obstructions. No mesenteric atherosclerosis beyond slight atherosclerosis at the origin of the celiac artery. No aneurysm or dissection involving aorta, major mesenteric, and major pelvic arterial vessels. No fibromuscular dysplasia. 3.      Hepatic steatosis. 4. No bowel obstruction or evidence of bowel ischemia. No abscess in the abdomen or pelvis. Appendix region appears normal. 5. No evident renal or ureteral calculus. No hydronephrosis. Urinary bladder wall thickness is within normal limits. Electronically Signed   By: Lowella Grip III M.D.   On: 03/17/2019 14:55     CBC Recent Labs  Lab 03/17/19 1417 03/19/19 1622 03/20/19 0540  WBC 10.8* 8.1 6.9  HGB 14.5 13.0 12.6*  HCT 43.1 39.7 38.2*  PLT 328 333 322  MCV 94.7 96.6 97.0  MCH 31.9 31.6 32.0  MCHC 33.6 32.7 33.0  RDW 12.9 12.9 13.0  LYMPHSABS  --  2.2  --   MONOABS  --  0.8  --   EOSABS  --  0.1  --   BASOSABS  --  0.1  --     Chemistries  Recent Labs  Lab 03/17/19 1412 03/17/19 1417 03/19/19 1622 03/19/19 2325  NA  --  131* 134*  --   K  --  4.7 4.3  --   CL  --  94* 101  --   CO2  --  26 23  --   GLUCOSE  --  390* 322*  --   BUN  --  26* 16  --   CREATININE 2.00* 2.09* 1.54*  --   CALCIUM  --  9.6 9.0  --   MG  --   --   --  1.8  AST  --  19 20  --   ALT  --  24 23  --   ALKPHOS  --  36* 36*  --   BILITOT  --  0.7 0.4  --    ------------------------------------------------------------------------------------------------------------------ No results for input(s): CHOL, HDL, LDLCALC, TRIG, CHOLHDL, LDLDIRECT in the last 72 hours.  Lab Results  Component  Value Date   HGBA1C 9.5 (H) 11/22/2018   ------------------------------------------------------------------------------------------------------------------ No results for input(s): TSH, T4TOTAL, T3FREE, THYROIDAB in the last 72 hours.  Invalid input(s): FREET3 ------------------------------------------------------------------------------------------------------------------ No results for input(s): VITAMINB12, FOLATE, FERRITIN, TIBC, IRON, RETICCTPCT in the last 72 hours.  Coagulation profile Recent Labs  Lab 03/19/19 2328  INR 0.9    No results for input(s): DDIMER in the last 72 hours.  Cardiac Enzymes Recent Labs  Lab 03/17/19 1417 03/19/19 2325 03/20/19 0540  TROPONINI <0.03 <0.03 <0.03   ------------------------------------------------------------------------------------------------------------------    Component Value Date/Time   BNP 230.0 (H) 10/07/2017 1257    Dustin Cruz M.D on 03/20/2019 at 4:03 PM  Go to [email or web address] - for contact info  [facility] - Office  [phone]

## 2019-03-20 NOTE — Progress Notes (Signed)
ANTICOAGULATION CONSULT NOTE -   Pharmacy Consult for Heparin Indication: splenic infarcts  No Known Allergies  Patient Measurements: Height: 6\' 2"  (188 cm) Weight: 195 lb (88.5 kg) IBW/kg (Calculated) : 82.2 Heparin Dosing Weight: 88.5 kg  Vital Signs: Temp: 97.8 F (36.6 C) (06/11 2005) Temp Source: Oral (06/11 2005) BP: 143/84 (06/11 2005) Pulse Rate: 76 (06/11 2005)  Labs: Recent Labs    03/19/19 1622 03/19/19 2325 03/19/19 2328 03/20/19 0540 03/20/19 0639 03/20/19 1647  HGB 13.0  --   --  12.6*  --   --   HCT 39.7  --   --  38.2*  --   --   PLT 333  --   --  322  --   --   APTT  --   --  26  --   --   --   LABPROT  --   --  12.0  --   --   --   INR  --   --  0.9  --   --   --   HEPARINUNFRC  --   --   --   --  <0.10* <0.10*  CREATININE 1.54*  --   --   --   --   --   CKTOTAL 245  --   --   --   --   --   TROPONINI  --  <0.03  --  <0.03  --   --     Estimated Creatinine Clearance: 64.5 mL/min (A) (by C-G formula based on SCr of 1.54 mg/dL (H)).   Medical History: Past Medical History:  Diagnosis Date  . Anxiety   . Asthma   . CKD stage 3 due to type 2 diabetes mellitus ([place])   . COPD (chronic obstructive pulmonary disease) ([place])   . Coronary artery disease    s/p BMS to [facility] 10/2010;  Cath 01/22/12 prox 30-40% LAD, LCx ramus w/ hazy 70-80% in-stent restenosis, EF 60% treated medically  . Coronary artery disease    s/p BMS to [facility] 10/2010;  Cath 01/22/12 prox 30-40% LAD, LCx ramus w/ hazy 70-80% in-stent restenosis, EF 60% treated medically   . Depression   . Diabetes mellitus    Type 2  . Gastroparesis 05/2013  . GERD (gastroesophageal reflux disease)   . HTN (hypertension)   . Hyperlipidemia   . Hyperlipidemia   . Major depression, chronic 08/13/2012  . Melanoma ([place]) 2007   surgery at [facility], Followed by [facility]  . Myocardial infarction ([place]) 2012  . Obesity   . Tobacco abuse   . Tubular adenoma   . Urinary retention     Medications:   Medications Prior to Admission  Medication Sig Dispense Refill Last Dose  . albuterol (PROVENTIL HFA;VENTOLIN HFA) 108 (90 Base) MCG/ACT inhaler Inhale 2 puffs into the lungs every 6 (six) hours as needed for wheezing. 1 Inhaler 2 unknown  . albuterol (PROVENTIL) (2.5 MG/3ML) 0.083% nebulizer solution Take 3 mLs (2.5 mg total) by nebulization every 6 (six) hours as needed for wheezing or shortness of breath. 75 mL 3 Past Week at Unknown time  . aspirin EC 81 MG tablet Take 81 mg by mouth every evening.    03/18/2019 at Unknown time  . atorvastatin (LIPITOR) 80 MG tablet Take 1 tablet by mouth once daily (Patient taking differently: Take 80 mg by mouth every morning. ) 90 tablet 0 03/19/2019 at Unknown time  . busPIRone (BUSPAR) 7.5 MG tablet Take 1 tablet (7.5  mg total) by mouth 2 (two) times daily. 60 tablet 2 03/19/2019 at Unknown time  . fenofibrate (TRICOR) 145 MG tablet Take 1 tablet (145 mg total) by mouth daily. 90 tablet 3 03/19/2019 at Unknown time  . furosemide (LASIX) 20 MG tablet Take 1 tablet (20 mg total) by mouth daily. 10 tablet 0 03/19/2019 at Unknown time  . gabapentin (NEURONTIN) 600 MG tablet Take 1 tablet (600 mg total) by mouth 3 (three) times daily. (Patient taking differently: Take 1,200 mg by mouth 2 (two) times daily. ) 270 tablet 1 03/19/2019 at Unknown time  . Insulin Glargine (LANTUS SOLOSTAR) 100 UNIT/ML Solostar Pen Inject 65-70 Units into the skin daily. (Patient taking differently: Inject 60 Units into the skin every evening. ) 21 mL 3 03/18/2019 at Unknown time  . lisinopril (ZESTRIL) 5 MG tablet TAKE 1 TABLET BY MOUTH AT BEDTIME (Patient taking differently: Take 5 mg by mouth at bedtime. ) 90 tablet 0 Past Week at Unknown time  . loratadine (CLARITIN) 10 MG tablet Take 10 mg by mouth daily as needed for allergies.   Past Month at Unknown time  . nitroGLYCERIN (NITROSTAT) 0.4 MG SL tablet Place 1 tablet (0.4 mg total) under the tongue every 5 (five) minutes as needed. For  chest pain. 25 tablet 3 unknown  . omeprazole (PRILOSEC) 40 MG capsule TAKE 1 CAPSULE BY MOUTH TWICE DAILY (Patient taking differently: TAKE 1 CAPSULE BY MOUTH at bedtime) 180 capsule 1 03/18/2019 at Unknown time  . sertraline (ZOLOFT) 100 MG tablet Take 1 tablet (100 mg total) by mouth daily. 90 tablet 3 03/19/2019 at Unknown time  . tamsulosin (FLOMAX) 0.4 MG CAPS capsule Take 1 capsule (0.4 mg total) by mouth daily after breakfast. 30 capsule 0 03/19/2019 at Unknown time  . traZODone (DESYREL) 150 MG tablet TAKE 1 TABLET BY MOUTH ONCE DAILY AT BEDTIME (Patient taking differently: Take 150 mg by mouth at bedtime. ) 90 tablet 0 03/18/2019 at Unknown time  . insulin aspart (NOVOLOG FLEXPEN) 100 UNIT/ML FlexPen Inject 10 Units into the skin 3 (three) times daily with meals. Inject up to 10 units prior to meal per sliding scale. (Patient not taking: Reported on 03/17/2019) 15 mL 2   . linaGLIPtin-metFORMIN HCl (JENTADUETO) 2.02-999 MG TABS Take 1 tablet by mouth 2 (two) times daily with a meal. (Patient not taking: Reported on 03/17/2019) 180 tablet 0    Scheduled:  . atorvastatin  80 mg Oral q morning - 10a  . busPIRone  7.5 mg Oral BID  . fenofibrate  160 mg Oral Daily  . gabapentin  600 mg Oral TID  . insulin aspart  0-9 Units Subcutaneous Q4H  . [START ON 03/22/2019] insulin glargine  20 Units Subcutaneous Daily  . pantoprazole  40 mg Oral Daily  . sertraline  100 mg Oral Daily  . tamsulosin  0.4 mg Oral QPC breakfast  . traZODone  150 mg Oral QHS   Infusions:  . heparin 1,350 Units/hr (03/20/19 1826)   PRN: acetaminophen **OR** acetaminophen, albuterol, ondansetron **OR** ondansetron (ZOFRAN) IV, polyethylene glycol  Assessment:  Dustin Cruz is a 53 y.o. male with medical history significant for DM2, Depression, CKD3, CAD, COPD, presented to the Ed 2 days ago with c/o central chest pain that started while he was sitting in his truck, he subsequently felt like he was going to pass out. Chest pain  radiated to his back. He reports associated difficulty breathing. Chest and Abdominal Ct was done with Contrast, negative for  PE but showed multiple splenic infarcts. He also had an AKI and mildly elevated CK- 400  Goal of Therapy:  Heparin level 0.3-0.7 units/ml Monitor platelets by anticoagulation protocol: Yes   Plan:  Rebolus 2500 units x 1 Increase heparin infusion to 1650 units/hr Check anti-Xa level in 6 hours and daily while on heparin Continue to monitor H&H and platelets.  Margot Ables, PharmD Clinical Pharmacist 03/20/2019 10:02 PM

## 2019-03-21 ENCOUNTER — Other Ambulatory Visit (HOSPITAL_COMMUNITY): Payer: Medicare Other

## 2019-03-21 ENCOUNTER — Inpatient Hospital Stay (HOSPITAL_COMMUNITY): Payer: Medicare Other

## 2019-03-21 ENCOUNTER — Encounter (HOSPITAL_COMMUNITY)
Admission: EM | Disposition: A | Discharge status: Home / Self Care | Payer: Self-pay | Source: Home / Self Care | Attending: Family Medicine

## 2019-03-21 DIAGNOSIS — R079 Chest pain, unspecified: Secondary | ICD-10-CM

## 2019-03-21 LAB — BASIC METABOLIC PANEL
Anion gap: 6 (ref 5–15)
BUN: 20 mg/dL (ref 6–20)
CO2: 28 mmol/L (ref 22–32)
Calcium: 9.3 mg/dL (ref 8.9–10.3)
Chloride: 107 mmol/L (ref 98–111)
Creatinine, Ser: 1.62 mg/dL — ABNORMAL HIGH (ref 0.61–1.24)
GFR calc Af Amer: 55 mL/min — ABNORMAL LOW (ref >60)
GFR calc non Af Amer: 48 mL/min — ABNORMAL LOW (ref >60)
Glucose, Bld: 89 mg/dL (ref 70–99)
Potassium: 4.4 mmol/L (ref 3.5–5.1)
Sodium: 141 mmol/L (ref 135–145)

## 2019-03-21 LAB — CBC
HCT: 38.8 % — ABNORMAL LOW (ref 39.0–52.0)
Hemoglobin: 12.7 g/dL — ABNORMAL LOW (ref 13.0–17.0)
MCH: 31.7 pg (ref 26.0–34.0)
MCHC: 32.7 g/dL (ref 30.0–36.0)
MCV: 96.8 fL (ref 80.0–100.0)
Platelets: 335 10*3/uL (ref 150–400)
RBC: 4.01 MIL/uL — ABNORMAL LOW (ref 4.22–5.81)
RDW: 13.1 % (ref 11.5–15.5)
WBC: 7.6 10*3/uL (ref 4.0–10.5)
nRBC: 0 % (ref 0.0–0.2)

## 2019-03-21 LAB — PROTEIN S, TOTAL: Protein S Ag, Total: 87 % (ref 60–150)

## 2019-03-21 LAB — ECHOCARDIOGRAM COMPLETE BUBBLE STUDY
Height: 74 in
Weight: 3120 oz

## 2019-03-21 LAB — PROTEIN C ACTIVITY: Protein C Activity: 184 % — ABNORMAL HIGH (ref 73–180)

## 2019-03-21 LAB — HEPARIN LEVEL (UNFRACTIONATED): Heparin Unfractionated: 0.22 IU/mL — ABNORMAL LOW (ref 0.30–0.70)

## 2019-03-21 LAB — NOVEL CORONAVIRUS, NAA (HOSP ORDER, SEND-OUT TO REF LAB; TAT 18-24 HRS): SARS-CoV-2, NAA: NOT DETECTED

## 2019-03-21 LAB — HOMOCYSTEINE: Homocysteine: 27.6 umol/L — ABNORMAL HIGH (ref 0.0–14.5)

## 2019-03-21 LAB — GLUCOSE, CAPILLARY
Glucose-Capillary: 133 mg/dL — ABNORMAL HIGH (ref 70–99)
Glucose-Capillary: 86 mg/dL (ref 70–99)
Glucose-Capillary: 149 mg/dL — ABNORMAL HIGH (ref 70–99)

## 2019-03-21 LAB — SURGICAL PCR SCREEN
MRSA, PCR: NEGATIVE
Staphylococcus aureus: NEGATIVE

## 2019-03-21 SURGERY — ECHOCARDIOGRAM, TRANSESOPHAGEAL
Anesthesia: Monitor Anesthesia Care

## 2019-03-21 MED ORDER — ALBUTEROL SULFATE (2.5 MG/3ML) 0.083% IN NEBU: 2.5000 mg | INHALATION_SOLUTION | Freq: Four times a day (QID) | RESPIRATORY_TRACT | 3 refills | Status: DC | PRN

## 2019-03-21 MED ORDER — APIXABAN 5 MG PO TABS
10.0000 mg | ORAL_TABLET | Freq: Two times a day (BID) | ORAL | Status: DC
  Administered 2019-03-21: 10 mg via ORAL
  Filled 2019-03-21: qty 2

## 2019-03-21 MED ORDER — TAMSULOSIN HCL 0.4 MG PO CAPS: 0.4000 mg | ORAL_CAPSULE | Freq: Every day | ORAL | 1 refills | Status: DC

## 2019-03-21 MED ORDER — LANTUS SOLOSTAR 100 UNIT/ML ~~LOC~~ SOPN: 60.0000 [IU] | PEN_INJECTOR | Freq: Every evening | SUBCUTANEOUS | 2 refills | Status: DC

## 2019-03-21 MED ORDER — APIXABAN 5 MG PO TABS: 5.0000 mg | ORAL_TABLET | Freq: Two times a day (BID) | ORAL | Status: DC

## 2019-03-21 MED ORDER — ASPIRIN EC 81 MG PO TBEC: 81.0000 mg | DELAYED_RELEASE_TABLET | Freq: Every day | ORAL | 5 refills | Status: AC

## 2019-03-21 MED ORDER — GABAPENTIN 600 MG PO TABS: 1200.0000 mg | ORAL_TABLET | Freq: Two times a day (BID) | ORAL | 2 refills | Status: DC

## 2019-03-21 MED ORDER — ACETAMINOPHEN 325 MG PO TABS: 650.0000 mg | ORAL_TABLET | Freq: Four times a day (QID) | ORAL | 2 refills | Status: AC | PRN

## 2019-03-21 MED ORDER — METFORMIN HCL 1000 MG PO TABS: 1000.0000 mg | ORAL_TABLET | Freq: Two times a day (BID) | ORAL | 11 refills | Status: DC

## 2019-03-21 MED ORDER — ALBUTEROL SULFATE HFA 108 (90 BASE) MCG/ACT IN AERS: 2.0000 | INHALATION_SPRAY | Freq: Four times a day (QID) | RESPIRATORY_TRACT | 2 refills | Status: DC | PRN

## 2019-03-21 MED ORDER — APIXABAN 5 MG PO TABS: 5.0000 mg | ORAL_TABLET | Freq: Two times a day (BID) | ORAL | 5 refills | Status: DC

## 2019-03-21 MED ORDER — ELIQUIS 5 MG VTE STARTER PACK: ORAL_TABLET | ORAL | 0 refills | Status: DC

## 2019-03-21 MED ORDER — HEPARIN BOLUS VIA INFUSION
2000.0000 [IU] | Freq: Once | INTRAVENOUS | Status: AC
  Administered 2019-03-21: 2000 [IU] via INTRAVENOUS
  Filled 2019-03-21: qty 2000

## 2019-03-21 MED ORDER — PROPOFOL 10 MG/ML IV BOLUS
INTRAVENOUS | Status: AC
  Filled 2019-03-21: qty 20

## 2019-03-21 NOTE — Discharge Instructions (Signed)
Information on my medicine - ELIQUIS (apixaban)  This medication education was reviewed with me or my healthcare representative as part of my discharge preparation.  The pharmacist that spoke with me during my hospital stay was:  Ramond Craver, [facility]  Why was Eliquis prescribed for you? Eliquis was prescribed to treat blood clots that may have been found in the veins of your legs (deep vein thrombosis) or in your lungs (pulmonary embolism) and to reduce the risk of them occurring again.  What do You need to know about Eliquis ? The starting dose is 10 mg (two 5 mg tablets) taken TWICE daily for the FIRST SEVEN (7) DAYS, then on (enter date)  03/28/19  the dose is reduced to ONE 5 mg tablet taken TWICE daily.  Eliquis may be taken with or without food.   Try to take the dose about the same time in the morning and in the evening. If you have difficulty swallowing the tablet whole please discuss with your pharmacist how to take the medication safely.  Take Eliquis exactly as prescribed and DO NOT stop taking Eliquis without talking to the doctor who prescribed the medication.  Stopping may increase your risk of developing a new blood clot.  Refill your prescription before you run out.  After discharge, you should have regular check-up appointments with your healthcare provider that is prescribing your Eliquis.    What do you do if you miss a dose? If a dose of ELIQUIS is not taken at the scheduled time, take it as soon as possible on the same day and twice-daily administration should be resumed. The dose should not be doubled to make up for a missed dose.  Important Safety Information A possible side effect of Eliquis is bleeding. You should call your healthcare provider right away if you experience any of the following: ? Bleeding from an injury or your nose that does not stop. ? Unusual colored urine (red or dark brown) or unusual colored stools (red or black). ? Unusual bruising for  unknown reasons. ? A serious fall or if you hit your head (even if there is no bleeding).  Some medicines may interact with Eliquis and might increase your risk of bleeding or clotting while on Eliquis. To help avoid this, consult your healthcare provider or pharmacist prior to using any new prescription or non-prescription medications, including herbals, vitamins, non-steroidal anti-inflammatory drugs (NSAIDs) and supplements.  This website has more information on Eliquis (apixaban): http://www.eliquis.com/eliquis/home

## 2019-03-21 NOTE — Progress Notes (Addendum)
Transthoracic echocardiogram not performed yesterday as coronavirus results had not been resulted.  Today it is found to be negative. There was no intramural hematoma in the aorta on noncontrast enhanced CT study.   We will cancel TEE and proceed with transthoracic echocardiogram today.

## 2019-03-21 NOTE — Progress Notes (Signed)
*  PRELIMINARY RESULTS* Echocardiogram 2D Echocardiogram WITH BUBBLE STUDY has been performed.  Leavy Cella 03/21/2019, 1:03 PM

## 2019-03-21 NOTE — Progress Notes (Signed)
ANTICOAGULATION CONSULT NOTE - Follow Up Consult  Pharmacy Consult for heparin Indication: splenic infarcts  Labs: Recent Labs    03/19/19 1622 03/19/19 2325 03/19/19 2328 03/20/19 0540 03/20/19 0639 03/20/19 1647 03/21/19 0606  HGB 13.0  --   --  12.6*  --   --  12.7*  HCT 39.7  --   --  38.2*  --   --  38.8*  PLT 333  --   --  322  --   --  335  APTT  --   --  26  --   --   --   --   LABPROT  --   --  12.0  --   --   --   --   INR  --   --  0.9  --   --   --   --   HEPARINUNFRC  --   --   --   --  <0.10* <0.10* 0.22*  CREATININE 1.54*  --   --   --   --   --  1.62*  CKTOTAL 245  --   --   --   --   --   --   TROPONINI  --  <0.03  --  <0.03  --   --   --     Assessment: 52yo male subtherapeutic on heparin after rate increase though closer to goal; no gtt issues or signs of bleeding per RN.  Goal of Therapy:  Heparin level 0.3-0.7 units/ml   Plan:  Will give small heparin bolus of 2000 units and increase heparin gtt by 2 units/kg/hr to 1800 units/hr and check level in 6 hours.    Wynona Neat, PharmD, BCPS  03/21/2019,7:05 AM

## 2019-03-21 NOTE — Discharge Summary (Signed)
KAMRON VANWYHE, is a 53 y.o. male  DOB November 07, 1965  MRN [id number].  Admission date:  03/19/2019  Admitting Physician  Bethena Roys, MD  Discharge Date:  03/21/2019   Primary MD  [name], DO  Recommendations for primary care physician for things to follow:   1) you are taking Eliquis/apixaban which is a blood thinner so Avoid ibuprofen/Advil/Aleve/Motrin/Goody Powders/Naproxen/BC powders/Meloxicam/Diclofenac/Indomethacin and other Nonsteroidal anti-inflammatory medications as these will make you more likely to bleed and can cause stomach ulcers, can also cause Kidney problems.   2) please call the cardiology office next week and make appointment with Dr. Bronson Ing your cardiologist--- you will need to be seen in the cardiology office within the next 2 to 3 weeks for recheck  3) Please call if blood in your urine blood in your stool, dark stools or any other concerns about bleeding   Admission Diagnosis  Weakness [R53.1] Splenic infarct [D73.5] Near syncope [R55] Chest pain, unspecified type [R07.9]   Discharge Diagnosis  Weakness [R53.1] Splenic infarct [D73.5] Near syncope [R55] Chest pain, unspecified type [R07.9]    Principal Problem:   Splenic infarct Active Problems:   Diabetes mellitus, type 2 ([place])   Essential HTN (hypertension)   Arteriosclerotic cardiovascular disease (ASCVD)   Tobacco abuse   Hyperlipidemia      Past Medical History:  Diagnosis Date   Anxiety    Asthma    CKD stage 3 due to type 2 diabetes mellitus ([place])    COPD (chronic obstructive pulmonary disease) ([place])    Coronary artery disease    s/p BMS to Ramus 10/2010;  Cath 01/22/12 prox 30-40% LAD, LCx ramus w/ hazy 70-80% in-stent restenosis, EF 60% treated medically   Coronary artery disease    s/p BMS to Ramus 10/2010;  Cath 01/22/12 prox 30-40% LAD, LCx ramus w/ hazy 70-80% in-stent restenosis,  EF 60% treated medically    Depression    Diabetes mellitus    Type 2   Gastroparesis 05/2013   GERD (gastroesophageal reflux disease)    HTN (hypertension)    Hyperlipidemia    Hyperlipidemia    Major depression, chronic 08/13/2012   Melanoma ([place]) 2007   surgery at [facility], Followed by [place]   Myocardial infarction [facility]) 2012   Obesity    Tobacco abuse    Tubular adenoma    Urinary retention     Past Surgical History:  Procedure Laterality Date   BIOPSY  01/07/2018   Procedure: BIOPSY;  Surgeon: Daneil Dolin, MD;  [facility];  Service: Endoscopy;;  duodenal and gastric biopsy   BIOPSY  04/25/2018   Procedure: BIOPSY;  Surgeon: Daneil Dolin, MD;  [facility];  Service: Endoscopy;;  ascending and descending and sigmoid biopsies   CARDIAC CATHETERIZATION     with stent   COLONOSCOPY N/A 01/27/2013   GGY:IRSWNI and colonic polyps. Tubular adenomas, poor bowel prep, one-year follow-up surveillance colonoscopy recommended   COLONOSCOPY WITH PROPOFOL N/A 04/25/2018   Procedure: COLONOSCOPY WITH PROPOFOL;  Surgeon: Daneil Dolin, MD;  Location: [facility];  Service: Endoscopy;  Laterality: N/A;  11:00am - pt to be prepped INPT and labs to be done as well   CORONARY ANGIOPLASTY WITH STENT PLACEMENT     ESOPHAGOGASTRODUODENOSCOPY  04/24/2012   [place]-mild erosive reflux esophagitis,dilated w/87F Venia Minks, small HH, minimal chronic gastric/bulbar erosions(No H pylori)   ESOPHAGOGASTRODUODENOSCOPY (EGD) WITH PROPOFOL N/A 01/07/2018   Dr. Gala Romney: Erythematous mucosa in the stomach, retained gastric contents, incomplete exam.  Biopsied showed mild chronic gastritis but no H. pylori.  Duodenal biopsy was negative for celiac disease.   FLEXIBLE SIGMOIDOSCOPY N/A 01/07/2018   Dr. Gala Romney: Incomplete colonoscopy due to inadequate bowel prep   LEFT HEART CATHETERIZATION WITH CORONARY ANGIOGRAM N/A 01/22/2012   Procedure: LEFT HEART CATHETERIZATION  WITH CORONARY ANGIOGRAM;  Surgeon: Jolaine Artist, MD;  Location: [facility] CATH LAB;  Service: Cardiovascular;  Laterality: N/A;   melanoma surgery  2007   [facility], removed lymph nodes under arm as well,Left abd      HPI  from the history and physical done on the day of admission:    HPI: EARVIN BLAZIER is a 53 y.o. male with medical history significant for DM2, Depression, CKD3, CAD, COPD, presented to the Ed 2 days ago with c/o central chest pain that started while he was sitting in his truck, he subsequently felt like he was going to pass out. Chest pain radiated to his back. He reports associated difficulty breathing. Chest and Abdominal Ct was done with Contrast, negative for PE but showed multiple splenic infarcts. He also had an AKI and mildly elevated CK- 400,  Admission was recommended by patient left AMA. He saw his PCP today who recommended he come back to the ED.  Neither his Chest pain or the other symptoms have recurred since that day, and he denies ever having abdominal pain. Over the past 2 days he has had increasing weakness all over. He report tingling sensation in his bilat feet, but no focal weakness of his extremities.  ED Course: Stable vitals. Improved Cr 1.54 today from 2 two days ago. CK 245. EKG sinus with old LBBB. Hospitalist to admit for splenic infarct workup.    Hospital Course:    Brief summary 53 year old with past medical history relevant for CKD 3, COPD, CAD, DM, depressive disorder who was admitted with splenic infarcts on 03/19/2019   A/p 1)DVT/splenic infarcts--- unknown etiology,, hypercoagulability work-up ordered and pending discussed with cardiology team,  TTE  preserved EF of 60 to 65%, no significant valvular abnormalities or intracardiac clot, Dr. Bronson Ing would like to do TEE as outpatient , patient was transitioned from IV heparin to p.o. Eliquis, continue Eliquis as ordered.... Hypercoagulability work-up shows normal Antithrombin III, protein S is  normal, protein C total is pending, antiphospholipid syndrome and homocystine levels are pending, factor V Leyden is pending, prothrombin gene mutation pending  2)CKD-III--- creatinine currently around 1.5 to 1.6 which is around patient's baseline  3)CAD--- currently chest pain-free, no ACS symptoms, prior angioplasty with stenting, continue aspirin,, continue Lipitor 80 mg, fenofibrate , cardiology input appreciated  4)DM2-A1c was 9.5 in February 2020 reflecting uncontrolled diabetes,  patient admits to noncompliance with short-acting insulin, okay to discharge home on insulin regimen and metformin.. Need to be compliant advised   5)Tobacco Abuse---  smoking cessation advised, okay to use nicotine patch  6) COPD--stable, no acute exacerbation, continue bronchodilators  7)depression/anxiety--- stable, continue trazodone BuSpar and Zoloft,  Code Status : Full  Family Communication:   Girlfriend/significant other--discussed with Ms. Donney Rankins   Disposition Plan  : To be determined  Consults  : Cardiology  Discharge Condition: Stable , chest pain-free  Follow UP  Follow-up Information    Erma Heritage, PA-C Follow up on 04/10/2019.   Specialties: Physician Assistant, Cardiology Why: 3:30   Contact information: [address] [place] [address] [phone]           Diet and Activity recommendation:  As advised  Discharge Instructions    Discharge Instructions    Call MD for:  difficulty breathing, headache or visual disturbances   Complete by: As directed    Call MD for:  persistant dizziness or light-headedness   Complete by: As directed    Call MD for:  persistant nausea and vomiting   Complete by: As directed    Call MD for:  temperature >100.4   Complete by: As directed    Diet - low sodium heart healthy   Complete by: As directed    Diet Carb Modified   Complete by: As directed    Discharge instructions   Complete by: As directed     1) you are taking Eliquis/apixaban which is a blood thinner so Avoid ibuprofen/Advil/Aleve/Motrin/Goody Powders/Naproxen/BC powders/Meloxicam/Diclofenac/Indomethacin and other Nonsteroidal anti-inflammatory medications as these will make you more likely to bleed and can cause stomach ulcers, can also cause Kidney problems.   2) please call the cardiology office next week and make appointment with Dr. Bronson Ing your cardiologist--- you will need to be seen in the cardiology office within the next 2 to 3 weeks for recheck  3) Please call if blood in your urine blood in your stool, dark stools or any other concerns about bleeding   Increase activity slowly   Complete by: As directed         Discharge Medications     Allergies as of 03/21/2019   No Known Allergies     Medication List    STOP taking these medications   linaGLIPtin-metFORMIN HCl 2.02-999 MG Tabs Commonly known as: Jentadueto     TAKE these medications   acetaminophen 325 MG tablet Commonly known as: TYLENOL Take 2 tablets (650 mg total) by mouth every 6 (six) hours as needed for mild pain, moderate pain or fever (or Fever >/= 101).   albuterol (2.5 MG/3ML) 0.083% nebulizer solution Commonly known as: PROVENTIL Take 3 mLs (2.5 mg total) by nebulization every 6 (six) hours as needed for wheezing or shortness of breath. Dx---44.1 What changed: additional instructions   albuterol 108 (90 Base) MCG/ACT inhaler Commonly known as: VENTOLIN HFA Inhale 2 puffs into the lungs every 6 (six) hours as needed for wheezing. Dx--44.1 What changed: additional instructions   aspirin EC 81 MG tablet Take 1 tablet (81 mg total) by mouth daily with breakfast. What changed: when to take this   atorvastatin 80 MG tablet Commonly known as: LIPITOR Take 1 tablet by mouth once daily What changed: when to take this   busPIRone 7.5 MG tablet Commonly known as: BUSPAR Take 1 tablet (7.5 mg total) by mouth 2 (two) times daily.    Eliquis DVT/PE Starter [place] 5 MG Tabs Take as directed on package: start with two-5mg  tablets twice daily for 7 days. On day 8, switch to one-5mg  tablet twice daily.   apixaban 5 MG Tabs tablet Commonly known as: Eliquis Take 1 tablet (5 mg total) by mouth 2 (two) times daily. Start around July 10th after you  complete  the initial starter [place] Prescription Start taking on: April 18, 2019   fenofibrate 145 MG tablet Commonly known as: Tricor Take 1 tablet (145 mg total) by mouth daily.   furosemide 20 MG tablet Commonly known as: LASIX Take 1 tablet (20 mg total) by mouth daily.   gabapentin 600 MG tablet Commonly known as: Neurontin Take 2 tablets (1,200 mg total) by mouth 2 (two) times daily.   insulin aspart 100 UNIT/ML FlexPen Commonly known as: NovoLOG FlexPen Inject 10 Units into the skin 3 (three) times daily with meals. Inject up to 10 units prior to meal per sliding scale.   Lantus SoloStar 100 UNIT/ML Solostar Pen Generic drug: Insulin Glargine Inject 60 Units into the skin every evening.   lisinopril 5 MG tablet Commonly known as: ZESTRIL TAKE 1 TABLET BY MOUTH AT BEDTIME   loratadine 10 MG tablet Commonly known as: CLARITIN Take 10 mg by mouth daily as needed for allergies.   metFORMIN 1000 MG tablet Commonly known as: Glucophage Take 1 tablet (1,000 mg total) by mouth 2 (two) times daily with a meal. Start taking on: March 22, 2019   nitroGLYCERIN 0.4 MG SL tablet Commonly known as: NITROSTAT Place 1 tablet (0.4 mg total) under the tongue every 5 (five) minutes as needed. For chest pain.   omeprazole 40 MG capsule Commonly known as: PRILOSEC TAKE 1 CAPSULE BY MOUTH TWICE DAILY What changed:   how much to take  how to take this  when to take this   sertraline 100 MG tablet Commonly known as: ZOLOFT Take 1 tablet (100 mg total) by mouth daily.   tamsulosin 0.4 MG Caps capsule Commonly known as: FLOMAX Take 1 capsule (0.4 mg total) by mouth daily  after breakfast.   traZODone 150 MG tablet Commonly known as: DESYREL TAKE 1 TABLET BY MOUTH ONCE DAILY AT BEDTIME       Major procedures and Radiology Reports - PLEASE review detailed and final reports for all details, in brief -  Dg Chest Portable 1 View  Result Date: 03/17/2019 CLINICAL DATA:  Chest pain.Patient was working outside. Became lightheaded and feeling hot. Chest and neck pain EXAM: PORTABLE CHEST 1 VIEW COMPARISON:  03/17/2019 FINDINGS: Normal heart size. No pleural effusion or edema. No airspace opacities. The lungs are hyperinflated with coarsened interstitial markings of emphysema. Visualized osseous structures are unremarkable. IMPRESSION: 1. No acute cardiopulmonary abnormalities. Electronically Signed   By: Kerby Moors M.D.   On: 03/17/2019 14:53   Ct Angio Chest/abd/pel For Dissection W And/or W/wo  Result Date: 03/17/2019 CLINICAL DATA:  Chest pain and abdominal pain EXAM: CT ANGIOGRAPHY CHEST, ABDOMEN AND PELVIS TECHNIQUE: Initially, axial CT images were obtained through the chest without intravenous contrast material administration. Multidetector CT imaging through the chest, abdomen and pelvis was performed using the standard protocol during bolus administration of intravenous contrast. Multiplanar reconstructed images and MIPs were obtained and reviewed to evaluate the vascular anatomy. CONTRAST:  55mL OMNIPAQUE IOHEXOL 350 MG/ML SOLN COMPARISON:  CT abdomen and pelvis August 06, 2018. FINDINGS: CTA CHEST FINDINGS Cardiovascular: There is no intramural hematoma in the aorta on noncontrast enhanced study. There is no demonstrable thoracic aortic aneurysm or dissection. Visualized great vessels appear normal except for slight calcification at the origin of the right subclavian artery. There is no demonstrable pulmonary embolus. There is a minimal pericardial effusion which may be within the physiologic range. There is no pericardial thickening. There are foci of coronary  artery calcification. There is  slight aortic atherosclerosis near the aortic valve. Mediastinum/Nodes: Thyroid appears unremarkable. There is no demonstrable thoracic adenopathy. No esophageal lesions are evident. Lungs/Pleura: There is slight bibasilar atelectatic change. Lungs elsewhere are clear. No pleural effusion or pleural thickening evident. Musculoskeletal: No blastic or lytic bone lesions. No evident fracture or dislocation. No chest wall lesions are evident. Review of the MIP images confirms the above findings. CTA ABDOMEN AND PELVIS FINDINGS VASCULAR Aorta: There is no abdominal aortic aneurysm or dissection. There are foci of aortic atherosclerotic calcification inferior to the renal arteries with mild plaque in this area. There is no hemodynamically significant obstructive disease in the aorta. Celiac: There is mild atherosclerotic plaque at the origin of the celiac artery without hemodynamically significant obstruction. No other atherosclerotic plaque is noted in the celiac artery branches. In particular, this splenic artery appears unremarkable. No aneurysm or dissection involving the celiac artery or its branches. SMA: The superior mesenteric artery and its branches appear widely patent. No aneurysm or dissection evident. Renals: There is a single renal artery on the left. There is a single renal artery arising from the aorta which almost immediately branches into 2 adjacent renal arteries. There is no appreciable vessel narrowing or obstructive disease in either renal artery or branches. No fibromuscular dysplasia evident. No aneurysm or dissection. IMA: So inferior mesenteric artery and its branches appear patent. No aneurysm or dissection involving these branches. Inflow: There is moderate calcification in each common iliac artery, slightly more on the left than on the right. No hemodynamically significant obstruction is evident in either common iliac artery. There are areas of patchy  atherosclerotic plaque in the left external iliac artery without hemodynamically significant obstruction. There is moderate atherosclerotic plaque in each internal iliac artery without hemodynamically significant obstruction. There is mild plaque in each common femoral artery without hemodynamically significant obstruction. The proximal superficial femoral and profunda femoral arteries appear widely patent. No aneurysm or dissection involving pelvic arterial vascular structures. Veins: No obvious venous abnormality within the limitations of this arterial phase study. Review of the MIP images confirms the above findings. NON-VASCULAR Hepatobiliary: There is hepatic steatosis. No focal liver lesions are evident. Gallbladder wall is not appreciably thickened. There is no biliary duct dilatation. Pancreas: No pancreatic mass or inflammatory focus. Spleen: There are peripheral splenic infarcts throughout the spleen. Spleen otherwise appears unremarkable. Spleen appears normal in size and contour. Adrenals/Urinary Tract: Adrenals bilaterally appear unremarkable. Kidneys bilaterally show no evident mass or hydronephrosis on either side. There is no evident renal or ureteral calculus on either side. Urinary bladder is midline with wall thickness within normal limits. Stomach/Bowel: There is no appreciable bowel wall or mesenteric thickening. There is no evident bowel obstruction. There is no free air or portal venous air. Terminal ileum appears normal. Lymphatic: There is no demonstrable adenopathy in the abdomen or pelvis. Reproductive: Prostate and seminal vesicles appear normal in size and contour. No pelvic mass evident. Other: Appendix appears normal. There is no abscess or ascites in the abdomen or pelvis. Musculoskeletal: There is degenerative change in the lumbar spine. There are no blastic or lytic bone lesions. There is no intramuscular or abdominal wall lesion. Review of the MIP images confirms the above  findings. IMPRESSION: CT angiogram chest: 1. No thoracic aortic aneurysm or dissection. There is slight aortic atherosclerotic calcification in the ascending aorta near the aortic valve. There is slight atherosclerotic calcification at the origin of the right subclavian artery. There are foci of coronary artery calcification. 2.  No  evident pulmonary embolus. 3.  Slight bibasilar atelectasis.  Lungs elsewhere clear. 4.  No evident thoracic adenopathy. CT angiogram abdomen; CT angiogram pelvis: 1. Multiple splenic infarcts. Etiology for these infarcts is uncertain. Splenic artery appears patent without appreciable obstructive disease. There is modest atherosclerotic irregularity at the origin of the celiac artery. There is only minimal aortic atherosclerosis proximal to the level of the splenic artery. Consider cardiac etiology for these infarcts. Note that no apparent cardiac lesion to account for these findings is seen by CT. 2. There is distal abdominal aortic atherosclerosis without hemodynamically significant obstruction. There are multiple foci of pelvic arterial vascular calcification without hemodynamically significant obstructions. No mesenteric atherosclerosis beyond slight atherosclerosis at the origin of the celiac artery. No aneurysm or dissection involving aorta, major mesenteric, and major pelvic arterial vessels. No fibromuscular dysplasia. 3.      Hepatic steatosis. 4. No bowel obstruction or evidence of bowel ischemia. No abscess in the abdomen or pelvis. Appendix region appears normal. 5. No evident renal or ureteral calculus. No hydronephrosis. Urinary bladder wall thickness is within normal limits. Electronically Signed   By: Lowella Grip III M.D.   On: 03/17/2019 14:55    Micro Results    Recent Results (from the past 240 hour(s))  SARS Coronavirus 2 (CEPHEID - Performed in [facility] hospital lab), Hosp Order     Status: None   Collection Time: 03/17/19  4:04 PM   Specimen:  Nasopharyngeal Swab  Result Value Ref Range Status   SARS Coronavirus 2 NEGATIVE NEGATIVE Final    Comment: (NOTE) If result is NEGATIVE SARS-CoV-2 target nucleic acids are NOT DETECTED. The SARS-CoV-2 RNA is generally detectable in upper and lower  respiratory specimens during the acute phase of infection. The lowest  concentration of SARS-CoV-2 viral copies this assay can detect is 250  copies / mL. A negative result does not preclude SARS-CoV-2 infection  and should not be used as the sole basis for treatment or other  patient management decisions.  A negative result may occur with  improper specimen collection / handling, submission of specimen other  than nasopharyngeal swab, presence of viral mutation(s) within the  areas targeted by this assay, and inadequate number of viral copies  (<250 copies / mL). A negative result must be combined with clinical  observations, patient history, and epidemiological information. If result is POSITIVE SARS-CoV-2 target nucleic acids are DETECTED. The SARS-CoV-2 RNA is generally detectable in upper and lower  respiratory specimens dur ing the acute phase of infection.  Positive  results are indicative of active infection with SARS-CoV-2.  Clinical  correlation with patient history and other diagnostic information is  necessary to determine patient infection status.  Positive results do  not rule out bacterial infection or co-infection with other viruses. If result is PRESUMPTIVE POSTIVE SARS-CoV-2 nucleic acids MAY BE PRESENT.   A presumptive positive result was obtained on the submitted specimen  and confirmed on repeat testing.  While 2019 novel coronavirus  (SARS-CoV-2) nucleic acids may be present in the submitted sample  additional confirmatory testing may be necessary for epidemiological  and / or clinical management purposes  to differentiate between  SARS-CoV-2 and other Sarbecovirus currently known to infect humans.  If clinically  indicated additional testing with an alternate test  methodology [phone]) is advised. The SARS-CoV-2 RNA is generally  detectable in upper and lower respiratory sp ecimens during the acute  phase of infection. The expected result is Negative. Fact Sheet for Patients:  StrictlyIdeas.no Fact Sheet for Healthcare Providers: BankingDealers.co.za This test is not yet approved or cleared by the [place] FDA and has been authorized for detection and/or diagnosis of SARS-CoV-2 by FDA under an Emergency Use Authorization (EUA).  This EUA will remain in effect (meaning this test can be used) for the duration of the COVID-19 declaration under Section 564(b)(1) of the Act, 21 U.S.C. section 360bbb-3(b)(1), unless the authorization is terminated or revoked sooner. Performed at [facility], [address] [place], [place] [address]   Novel Coronavirus,NAA,(SEND-OUT TO REF LAB - TAT 24-48 hrs); Hosp Order     Status: None   Collection Time: 03/19/19  7:11 PM   Specimen: Nasopharyngeal Swab; Respiratory  Result Value Ref Range Status   SARS-CoV-2, NAA NOT DETECTED NOT DETECTED Final    Comment: (NOTE) Testing was performed using the cobas(R) SARS-CoV-2 test. This test was developed and its performance characteristics determined by [employer]. This test has not been FDA cleared or approved. This test has been authorized by FDA under an Emergency Use Authorization (EUA). This test is only authorized for the duration of time the declaration that circumstances exist justifying the authorization of the emergency use of in vitro diagnostic tests for detection of SARS-CoV-2 virus and/or diagnosis of COVID-19 infection under section 564(b)(1) of the Act, 21 U.S.C. 409BDZ-3(G)(9), unless the authorization is terminated or revoked sooner. When diagnostic testing is negative, the possibility of a false negative result should be considered in the  context of a patient's recent exposures and the presence of clinical signs and symptoms consistent with COVID-19. An individual without symptoms of COVID-19 and who is not shedding SARS-CoV-2 virus would expect to have  a negative (not detected) result in this assay. Performed At: [facility] [place], [place] [id number] Rush Farmer MD DQ:[id number]    [place]  Final    Comment: Performed at [facility], [address] [place], [place] [address]  Surgical pcr screen     Status: None   Collection Time: 03/21/19  3:04 AM   Specimen: Nasal Mucosa; Nasal Swab  Result Value Ref Range Status   MRSA, PCR NEGATIVE NEGATIVE Final   Staphylococcus aureus NEGATIVE NEGATIVE Final    Comment: (NOTE) The Xpert SA Assay (FDA approved for NASAL specimens in patients 61 years of age and older), is one component of a comprehensive surveillance program. It is not intended to diagnose infection nor to guide or monitor treatment. Performed at [facility], [address] [place], [place] [address]        Today   Subjective    Dustin Cruz today has no new complaints,  eating or drinking well, no bleeding concerns, no chest pains no palpitations no shortness of breath          Patient has been seen and examined prior to discharge   Objective   Blood pressure 105/62, pulse 72, temperature 97.9 F (36.6 C), resp. rate 16, height 6\' 2"  (1.88 m), weight 88.5 kg, SpO2 94 %.   Intake/Output Summary (Last 24 hours) at 03/21/2019 1548 Last data filed at 03/21/2019 0300 Gross per 24 hour  Intake 175.78 ml  Output --  Net 175.78 ml    Exam Gen:- Awake Alert,  In no apparent distress  HEENT:- [place].AT, No sclera icterus Neck-Supple Neck,No JVD,.  Lungs-  CTAB , fair symmetrical air movement CV- S1, S2 normal, regular  Abd-  +ve B.Sounds, Abd Soft, No tenderness,    Extremity/Skin:- No  edema, pedal pulses present  Psych-affect is appropriate,  oriented x3 Neuro-no new focal deficits, no tremors   Data Review   CBC w Diff:  Lab Results  Component Value Date   WBC 7.6 03/21/2019   HGB 12.7 (L) 03/21/2019   HGB 13.5 08/13/2018   HCT 38.8 (L) 03/21/2019   HCT 40.9 08/13/2018   PLT 335 03/21/2019   PLT 400 08/13/2018   LYMPHOPCT 28 03/19/2019   MONOPCT 10 03/19/2019   EOSPCT 1 03/19/2019   BASOPCT 1 03/19/2019    CMP:  Lab Results  Component Value Date   NA 141 03/21/2019   NA 135 11/22/2018   K 4.4 03/21/2019   CL 107 03/21/2019   CO2 28 03/21/2019   BUN 20 03/21/2019   BUN 16 11/22/2018   CREATININE 1.62 (H) 03/21/2019   CREATININE 0.97 05/07/2013   PROT 6.4 (L) 03/19/2019   PROT 6.3 08/28/2017   ALBUMIN 3.7 03/19/2019   ALBUMIN 4.2 08/28/2017   BILITOT 0.4 03/19/2019   BILITOT 0.4 08/28/2017   ALKPHOS 36 (L) 03/19/2019   AST 20 03/19/2019   ALT 23 03/19/2019  .   Total Discharge time is about 33 minutes  Roxan Hockey M.D on 03/21/2019 at 3:48 PM  Go to [email or web address] -  for contact info  [facility] - Office  [phone]

## 2019-03-21 NOTE — Progress Notes (Addendum)
Iv removed and discharge instructions reviewed.  Follow up with Clyda Hurdle for July 2. Girlfriend to give ride home.  Eliquis coupon given

## 2019-03-21 NOTE — Care Management Important Message (Signed)
Important Message  Patient Details  Name: Dustin Cruz MRN: [id number] Date of Birth: 04/24/1966   Medicare Important Message Given:  Yes(given to nurse to place at bedside due to contact precautiions)    Tommy Medal 03/21/2019, 1:56 PM

## 2019-03-21 NOTE — Progress Notes (Signed)
ANTICOAGULATION CONSULT NOTE -   Pharmacy Consult for apixaban Indication: splenic infarcts/DVT  No Known Allergies  Patient Measurements: Height: 6\' 2"  (188 cm) Weight: 195 lb (88.5 kg) IBW/kg (Calculated) : 82.2 Heparin Dosing Weight: 88.5 kg  Vital Signs: Temp: 97.9 F (36.6 C) (06/12 0507) BP: 105/62 (06/12 0510) Pulse Rate: 72 (06/12 0510)  Labs: Recent Labs    03/19/19 1622 03/19/19 2325 03/19/19 2328 03/20/19 0540 03/20/19 0639 03/20/19 1647 03/21/19 0606  HGB 13.0  --   --  12.6*  --   --  12.7*  HCT 39.7  --   --  38.2*  --   --  38.8*  PLT 333  --   --  322  --   --  335  APTT  --   --  26  --   --   --   --   LABPROT  --   --  12.0  --   --   --   --   INR  --   --  0.9  --   --   --   --   HEPARINUNFRC  --   --   --   --  <0.10* <0.10* 0.22*  CREATININE 1.54*  --   --   --   --   --  1.62*  CKTOTAL 245  --   --   --   --   --   --   TROPONINI  --  <0.03  --  <0.03  --   --   --     Estimated Creatinine Clearance: 61.3 mL/min (A) (by C-G formula based on SCr of 1.62 mg/dL (H)).   Medical History: Past Medical History:  Diagnosis Date  . Anxiety   . Asthma   . CKD stage 3 due to type 2 diabetes mellitus ([place])   . COPD (chronic obstructive pulmonary disease) ([place])   . Coronary artery disease    s/p BMS to Ramus 10/2010;  Cath 01/22/12 prox 30-40% LAD, LCx ramus w/ hazy 70-80% in-stent restenosis, EF 60% treated medically  . Coronary artery disease    s/p BMS to Ramus 10/2010;  Cath 01/22/12 prox 30-40% LAD, LCx ramus w/ hazy 70-80% in-stent restenosis, EF 60% treated medically   . Depression   . Diabetes mellitus    Type 2  . Gastroparesis 05/2013  . GERD (gastroesophageal reflux disease)   . HTN (hypertension)   . Hyperlipidemia   . Hyperlipidemia   . Major depression, chronic 08/13/2012  . Melanoma ([place]) 2007   surgery at [facility], Followed by [facility]  . Myocardial infarction ([place]) 2012  . Obesity   . Tobacco abuse   . Tubular adenoma   .  Urinary retention     Medications:  Medications Prior to Admission  Medication Sig Dispense Refill Last Dose  . albuterol (PROVENTIL HFA;VENTOLIN HFA) 108 (90 Base) MCG/ACT inhaler Inhale 2 puffs into the lungs every 6 (six) hours as needed for wheezing. 1 Inhaler 2 unknown  . albuterol (PROVENTIL) (2.5 MG/3ML) 0.083% nebulizer solution Take 3 mLs (2.5 mg total) by nebulization every 6 (six) hours as needed for wheezing or shortness of breath. 75 mL 3 Past Week at Unknown time  . aspirin EC 81 MG tablet Take 81 mg by mouth every evening.    03/18/2019 at Unknown time  . atorvastatin (LIPITOR) 80 MG tablet Take 1 tablet by mouth once daily (Patient taking differently: Take 80 mg by mouth every morning. ) 90  tablet 0 03/19/2019 at Unknown time  . busPIRone (BUSPAR) 7.5 MG tablet Take 1 tablet (7.5 mg total) by mouth 2 (two) times daily. 60 tablet 2 03/19/2019 at Unknown time  . fenofibrate (TRICOR) 145 MG tablet Take 1 tablet (145 mg total) by mouth daily. 90 tablet 3 03/19/2019 at Unknown time  . furosemide (LASIX) 20 MG tablet Take 1 tablet (20 mg total) by mouth daily. 10 tablet 0 03/19/2019 at Unknown time  . gabapentin (NEURONTIN) 600 MG tablet Take 1 tablet (600 mg total) by mouth 3 (three) times daily. (Patient taking differently: Take 1,200 mg by mouth 2 (two) times daily. ) 270 tablet 1 03/19/2019 at Unknown time  . Insulin Glargine (LANTUS SOLOSTAR) 100 UNIT/ML Solostar Pen Inject 65-70 Units into the skin daily. (Patient taking differently: Inject 60 Units into the skin every evening. ) 21 mL 3 03/18/2019 at Unknown time  . lisinopril (ZESTRIL) 5 MG tablet TAKE 1 TABLET BY MOUTH AT BEDTIME (Patient taking differently: Take 5 mg by mouth at bedtime. ) 90 tablet 0 Past Week at Unknown time  . loratadine (CLARITIN) 10 MG tablet Take 10 mg by mouth daily as needed for allergies.   Past Month at Unknown time  . nitroGLYCERIN (NITROSTAT) 0.4 MG SL tablet Place 1 tablet (0.4 mg total) under the tongue  every 5 (five) minutes as needed. For chest pain. 25 tablet 3 unknown  . omeprazole (PRILOSEC) 40 MG capsule TAKE 1 CAPSULE BY MOUTH TWICE DAILY (Patient taking differently: TAKE 1 CAPSULE BY MOUTH at bedtime) 180 capsule 1 03/18/2019 at Unknown time  . sertraline (ZOLOFT) 100 MG tablet Take 1 tablet (100 mg total) by mouth daily. 90 tablet 3 03/19/2019 at Unknown time  . tamsulosin (FLOMAX) 0.4 MG CAPS capsule Take 1 capsule (0.4 mg total) by mouth daily after breakfast. 30 capsule 0 03/19/2019 at Unknown time  . traZODone (DESYREL) 150 MG tablet TAKE 1 TABLET BY MOUTH ONCE DAILY AT BEDTIME (Patient taking differently: Take 150 mg by mouth at bedtime. ) 90 tablet 0 03/18/2019 at Unknown time  . insulin aspart (NOVOLOG FLEXPEN) 100 UNIT/ML FlexPen Inject 10 Units into the skin 3 (three) times daily with meals. Inject up to 10 units prior to meal per sliding scale. (Patient not taking: Reported on 03/17/2019) 15 mL 2   . linaGLIPtin-metFORMIN HCl (JENTADUETO) 2.02-999 MG TABS Take 1 tablet by mouth 2 (two) times daily with a meal. (Patient not taking: Reported on 03/17/2019) 180 tablet 0    Scheduled:  . atorvastatin  80 mg Oral q morning - 10a  . busPIRone  7.5 mg Oral BID  . fenofibrate  160 mg Oral Daily  . gabapentin  600 mg Oral TID  . insulin aspart  0-9 Units Subcutaneous Q4H  . [START ON 03/22/2019] insulin glargine  20 Units Subcutaneous Daily  . pantoprazole  40 mg Oral Daily  . sertraline  100 mg Oral Daily  . tamsulosin  0.4 mg Oral QPC breakfast  . traZODone  150 mg Oral QHS   Infusions:  . heparin 1,800 Units/hr (03/21/19 0811)   PRN: acetaminophen **OR** acetaminophen, albuterol, ondansetron **OR** ondansetron (ZOFRAN) IV, polyethylene glycol  Assessment:  Dustin Cruz is a 53 y.o. male with medical history significant for DM2, Depression, CKD3, CAD, COPD, presented to the Ed 2 days ago with c/o central chest pain that started while he was sitting in his truck, he subsequently felt like  he was going to pass out. Chest pain radiated to  his back. He reports associated difficulty breathing. Chest and Abdominal Ct was done with Contrast, negative for PE but showed multiple splenic infarcts.  Transitioning from heparin to Eliquis  Goal of Therapy:   Monitor platelets by anticoagulation protocol: Yes   Plan:  Stop heparin infusion Start apixaban 10 mg twice daily x 7 days followed by apixaban 5 mg twice daily.  Margot Ables, PharmD Clinical Pharmacist 03/21/2019 10:55 AM

## 2019-03-22 LAB — ANTIPHOSPHOLIPID SYNDROME EVAL, BLD
Anticardiolipin IgA: <9 APL U/mL (ref 0–11)
Anticardiolipin IgG: <9 GPL U/mL (ref 0–14)
Anticardiolipin IgM: <9 MPL U/mL (ref 0–12)
DRVVT: 33.6 s (ref 0.0–47.0)
PTT Lupus Anticoagulant: 29.6 s (ref 0.0–51.9)
Phosphatydalserine, IgA: 1 APS IgA (ref 0–20)
Phosphatydalserine, IgG: 3 GPS IgG (ref 0–11)
Phosphatydalserine, IgM: 4 MPS IgM (ref 0–25)

## 2019-03-22 LAB — PROTEIN C, TOTAL: Protein C, Total: 142 % (ref 60–150)

## 2019-03-26 LAB — FACTOR 5 LEIDEN

## 2019-03-26 LAB — PROTHROMBIN GENE MUTATION

## 2019-03-28 ENCOUNTER — Other Ambulatory Visit: Payer: Self-pay

## 2019-03-28 ENCOUNTER — Ambulatory Visit (INDEPENDENT_AMBULATORY_CARE_PROVIDER_SITE_OTHER): Payer: Medicare Other | Admitting: Family Medicine

## 2019-03-28 DIAGNOSIS — R5383 Other fatigue: Secondary | ICD-10-CM | POA: Diagnosis not present

## 2019-03-28 DIAGNOSIS — D735 Infarction of spleen: Secondary | ICD-10-CM | POA: Diagnosis not present

## 2019-03-28 DIAGNOSIS — E7211 Homocystinuria: Secondary | ICD-10-CM

## 2019-03-28 DIAGNOSIS — R7989 Other specified abnormal findings of blood chemistry: Secondary | ICD-10-CM

## 2019-03-28 DIAGNOSIS — Z09 Encounter for follow-up examination after completed treatment for conditions other than malignant neoplasm: Secondary | ICD-10-CM

## 2019-03-28 NOTE — Progress Notes (Signed)
Telephone visit  Subjective: CC: Hospital discharge follow-up PCP: [name], DO XTG:GYIRS D Weitz is a 53 y.o. male calls for telephone consult today. Patient provides verbal consent for consult held via phone.  Location of patient: Home Location of provider: Working remotely from home Others present for call: Girlfriend  1.  Hospital discharge follow-up, splenic infarcts Patient was admitted to the hospital for splenic infarcts, DVT of unknown etiology.  Hypercoagulability work-up was ordered and was notable for elevated homocystine and protein C.  TTE showed preserved EF of 60 to 65% with no valvular abnormalities.  There are plans for TEE with Dr. Bronson Ing on an outpatient basis.  Patient was initially treated with heparin but subsequently transitioned over to Eliquis.  He is continuing to take the starter Severe as directed.  There are refills of the 5 mg Eliquis twice daily at the pharmacy and he understands that he should transition over to this whenever he completes his starter [place].  He denies any abnormal bleeding including melena, hematochezia, hematuria or bleeding with brushing teeth.  He does report fatigue.  ROS: Per HPI  No Known Allergies Past Medical History:  Diagnosis Date  . Anxiety   . Asthma   . CKD stage 3 due to type 2 diabetes mellitus ([place])   . COPD (chronic obstructive pulmonary disease) ([place])   . Coronary artery disease    s/p BMS to Ramus 10/2010;  Cath 01/22/12 prox 30-40% LAD, LCx ramus w/ hazy 70-80% in-stent restenosis, EF 60% treated medically  . Coronary artery disease    s/p BMS to Ramus 10/2010;  Cath 01/22/12 prox 30-40% LAD, LCx ramus w/ hazy 70-80% in-stent restenosis, EF 60% treated medically   . Depression   . Diabetes mellitus    Type 2  . Gastroparesis 05/2013  . GERD (gastroesophageal reflux disease)   . HTN (hypertension)   . Hyperlipidemia   . Hyperlipidemia   . Major depression, chronic 08/13/2012  . Melanoma ([place]) 2007    surgery at [facility], Followed by [facility]  . Myocardial infarction ([place]) 2012  . Obesity   . Tobacco abuse   . Tubular adenoma   . Urinary retention     Current Outpatient Medications:  .  acetaminophen (TYLENOL) 325 MG tablet, Take 2 tablets (650 mg total) by mouth every 6 (six) hours as needed for mild pain, moderate pain or fever (or Fever >/= 101)., Disp: 30 tablet, Rfl: 2 .  albuterol (PROVENTIL) (2.5 MG/3ML) 0.083% nebulizer solution, Take 3 mLs (2.5 mg total) by nebulization every 6 (six) hours as needed for wheezing or shortness of breath. Dx---44.1, Disp: 75 mL, Rfl: 3 .  albuterol (VENTOLIN HFA) 108 (90 Base) MCG/ACT inhaler, Inhale 2 puffs into the lungs every 6 (six) hours as needed for wheezing. Dx--44.1, Disp: 1 Inhaler, Rfl: 2 .  [START ON 04/18/2019] apixaban (ELIQUIS) 5 MG TABS tablet, Take 1 tablet (5 mg total) by mouth 2 (two) times daily. Start around July 10th after you complete  the initial starter [place] Prescription, Disp: 60 tablet, Rfl: 5 .  aspirin EC 81 MG tablet, Take 1 tablet (81 mg total) by mouth daily with breakfast., Disp: 30 tablet, Rfl: 5 .  atorvastatin (LIPITOR) 80 MG tablet, Take 1 tablet by mouth once daily (Patient taking differently: Take 80 mg by mouth every morning. ), Disp: 90 tablet, Rfl: 0 .  busPIRone (BUSPAR) 7.5 MG tablet, Take 1 tablet (7.5 mg total) by mouth 2 (two) times daily., Disp: 60 tablet, Rfl:  2 .  Eliquis DVT/PE Starter [place] (ELIQUIS STARTER [place]) 5 MG TABS, Take as directed on package: start with two-5mg  tablets twice daily for 7 days. On day 8, switch to one-5mg  tablet twice daily., Disp: 1 each, Rfl: 0 .  fenofibrate (TRICOR) 145 MG tablet, Take 1 tablet (145 mg total) by mouth daily., Disp: 90 tablet, Rfl: 3 .  furosemide (LASIX) 20 MG tablet, Take 1 tablet (20 mg total) by mouth daily., Disp: 10 tablet, Rfl: 0 .  gabapentin (NEURONTIN) 600 MG tablet, Take 2 tablets (1,200 mg total) by mouth 2 (two) times daily., Disp: 60 tablet, Rfl:  2 .  insulin aspart (NOVOLOG FLEXPEN) 100 UNIT/ML FlexPen, Inject 10 Units into the skin 3 (three) times daily with meals. Inject up to 10 units prior to meal per sliding scale. (Patient not taking: Reported on 03/17/2019), Disp: 15 mL, Rfl: 2 .  Insulin Glargine (LANTUS SOLOSTAR) 100 UNIT/ML Solostar Pen, Inject 60 Units into the skin every evening., Disp: 3 mL, Rfl: 2 .  lisinopril (ZESTRIL) 5 MG tablet, TAKE 1 TABLET BY MOUTH AT BEDTIME (Patient taking differently: Take 5 mg by mouth at bedtime. ), Disp: 90 tablet, Rfl: 0 .  loratadine (CLARITIN) 10 MG tablet, Take 10 mg by mouth daily as needed for allergies., Disp: , Rfl:  .  metFORMIN (GLUCOPHAGE) 1000 MG tablet, Take 1 tablet (1,000 mg total) by mouth 2 (two) times daily with a meal., Disp: 60 tablet, Rfl: 11 .  nitroGLYCERIN (NITROSTAT) 0.4 MG SL tablet, Place 1 tablet (0.4 mg total) under the tongue every 5 (five) minutes as needed. For chest pain., Disp: 25 tablet, Rfl: 3 .  omeprazole (PRILOSEC) 40 MG capsule, TAKE 1 CAPSULE BY MOUTH TWICE DAILY (Patient taking differently: TAKE 1 CAPSULE BY MOUTH at bedtime), Disp: 180 capsule, Rfl: 1 .  sertraline (ZOLOFT) 100 MG tablet, Take 1 tablet (100 mg total) by mouth daily., Disp: 90 tablet, Rfl: 3 .  tamsulosin (FLOMAX) 0.4 MG CAPS capsule, Take 1 capsule (0.4 mg total) by mouth daily after breakfast., Disp: 30 capsule, Rfl: 1 .  traZODone (DESYREL) 150 MG tablet, TAKE 1 TABLET BY MOUTH ONCE DAILY AT BEDTIME (Patient taking differently: Take 150 mg by mouth at bedtime. ), Disp: 90 tablet, Rfl: 0  Estimated Creatinine Clearance: 61.3 mL/min (A) (by C-G formula based on SCr of 1.62 mg/dL (H)).  Assessment/ Plan: 53 y.o. male   1. Splenic infarct Uncertain etiology.  I reviewed his hospital discharge summary and laboratory results.  He did have an elevated homocystine and protein C.  I am unsure as to how to interpret this.  I will place a referral to hematology to see if they can be of assistance.   Continue Eliquis for now.  He has refills of this medication.  He may need an alternative medication like warfarin pending hematology's evaluation.  He has follow-up with cardiology in about 2 weeks.  I instructed him to keep this appointment.  We will try to arrange hematology evaluation near the time he seen cardiology if possible. - Ambulatory referral to Hematology  2. 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 Hospital discharge follow-up  4. Fatigue, unspecified type I reviewed his laboratory results.  No evidence of anemia.  Could consider obtaining thyroid function if he has ongoing fatigue.   Start time: 10:19am End time: 10:28am  Total time spent on patient care (including telephone call/ virtual visit): 19 minutes  [place] [place]  [facility] [phone]

## 2019-04-01 ENCOUNTER — Telehealth: Payer: Self-pay

## 2019-04-02 ENCOUNTER — Other Ambulatory Visit: Payer: Self-pay

## 2019-04-02 ENCOUNTER — Telehealth: Payer: Self-pay | Admitting: Family Medicine

## 2019-04-02 ENCOUNTER — Ambulatory Visit: Payer: Self-pay | Admitting: Licensed Clinical Social Worker

## 2019-04-02 ENCOUNTER — Encounter (HOSPITAL_COMMUNITY): Payer: Self-pay

## 2019-04-02 ENCOUNTER — Emergency Department (HOSPITAL_COMMUNITY): Payer: Medicare Other

## 2019-04-02 ENCOUNTER — Emergency Department (HOSPITAL_COMMUNITY)
Admission: EM | Admit: 2019-04-02 | Discharge: 2019-04-02 | Disposition: A | Discharge status: Home / Self Care | Payer: Medicare Other | Attending: Emergency Medicine | Admitting: Emergency Medicine

## 2019-04-02 DIAGNOSIS — J45909 Unspecified asthma, uncomplicated: Secondary | ICD-10-CM | POA: Insufficient documentation

## 2019-04-02 DIAGNOSIS — I491 Atrial premature depolarization: Secondary | ICD-10-CM | POA: Diagnosis not present

## 2019-04-02 DIAGNOSIS — K029 Dental caries, unspecified: Secondary | ICD-10-CM | POA: Insufficient documentation

## 2019-04-02 DIAGNOSIS — F17210 Nicotine dependence, cigarettes, uncomplicated: Secondary | ICD-10-CM | POA: Diagnosis not present

## 2019-04-02 DIAGNOSIS — I447 Left bundle-branch block, unspecified: Secondary | ICD-10-CM | POA: Diagnosis not present

## 2019-04-02 DIAGNOSIS — I2510 Atherosclerotic heart disease of native coronary artery without angina pectoris: Secondary | ICD-10-CM | POA: Diagnosis not present

## 2019-04-02 DIAGNOSIS — I129 Hypertensive chronic kidney disease with stage 1 through stage 4 chronic kidney disease, or unspecified chronic kidney disease: Secondary | ICD-10-CM | POA: Diagnosis not present

## 2019-04-02 DIAGNOSIS — F329 Major depressive disorder, single episode, unspecified: Secondary | ICD-10-CM

## 2019-04-02 DIAGNOSIS — E1122 Type 2 diabetes mellitus with diabetic chronic kidney disease: Secondary | ICD-10-CM | POA: Insufficient documentation

## 2019-04-02 DIAGNOSIS — Z79899 Other long term (current) drug therapy: Secondary | ICD-10-CM | POA: Diagnosis not present

## 2019-04-02 DIAGNOSIS — Z7901 Long term (current) use of anticoagulants: Secondary | ICD-10-CM | POA: Insufficient documentation

## 2019-04-02 DIAGNOSIS — I440 Atrioventricular block, first degree: Secondary | ICD-10-CM | POA: Diagnosis not present

## 2019-04-02 DIAGNOSIS — K0889 Other specified disorders of teeth and supporting structures: Secondary | ICD-10-CM | POA: Diagnosis not present

## 2019-04-02 DIAGNOSIS — E1159 Type 2 diabetes mellitus with other circulatory complications: Secondary | ICD-10-CM

## 2019-04-02 DIAGNOSIS — H70012 Subperiosteal abscess of mastoid, left ear: Secondary | ICD-10-CM | POA: Diagnosis not present

## 2019-04-02 DIAGNOSIS — I252 Old myocardial infarction: Secondary | ICD-10-CM | POA: Diagnosis not present

## 2019-04-02 DIAGNOSIS — Z720 Tobacco use: Secondary | ICD-10-CM

## 2019-04-02 DIAGNOSIS — Z7982 Long term (current) use of aspirin: Secondary | ICD-10-CM | POA: Insufficient documentation

## 2019-04-02 DIAGNOSIS — N183 Chronic kidney disease, stage 3 (moderate): Secondary | ICD-10-CM | POA: Insufficient documentation

## 2019-04-02 DIAGNOSIS — F32A Depression, unspecified: Secondary | ICD-10-CM

## 2019-04-02 DIAGNOSIS — J449 Chronic obstructive pulmonary disease, unspecified: Secondary | ICD-10-CM | POA: Insufficient documentation

## 2019-04-02 DIAGNOSIS — Z794 Long term (current) use of insulin: Secondary | ICD-10-CM | POA: Insufficient documentation

## 2019-04-02 DIAGNOSIS — I152 Hypertension secondary to endocrine disorders: Secondary | ICD-10-CM

## 2019-04-02 DIAGNOSIS — K089 Disorder of teeth and supporting structures, unspecified: Secondary | ICD-10-CM

## 2019-04-02 DIAGNOSIS — K047 Periapical abscess without sinus: Secondary | ICD-10-CM | POA: Diagnosis not present

## 2019-04-02 DIAGNOSIS — J441 Chronic obstructive pulmonary disease with (acute) exacerbation: Secondary | ICD-10-CM

## 2019-04-02 DIAGNOSIS — R1084 Generalized abdominal pain: Secondary | ICD-10-CM | POA: Diagnosis not present

## 2019-04-02 LAB — CBC WITH DIFFERENTIAL/PLATELET
Abs Immature Granulocytes: 0.01 10*3/uL (ref 0.00–0.07)
Basophils Absolute: 0.1 10*3/uL (ref 0.0–0.1)
Basophils Relative: 1 %
Eosinophils Absolute: 0.1 10*3/uL (ref 0.0–0.5)
Eosinophils Relative: 1 %
HCT: 43 % (ref 39.0–52.0)
Hemoglobin: 14.2 g/dL (ref 13.0–17.0)
Immature Granulocytes: 0 %
Lymphocytes Relative: 25 %
Lymphs Abs: 1.8 10*3/uL (ref 0.7–4.0)
MCH: 31.8 pg (ref 26.0–34.0)
MCHC: 33 g/dL (ref 30.0–36.0)
MCV: 96.2 fL (ref 80.0–100.0)
Monocytes Absolute: 0.7 10*3/uL (ref 0.1–1.0)
Monocytes Relative: 10 %
Neutro Abs: 4.4 10*3/uL (ref 1.7–7.7)
Neutrophils Relative %: 63 %
Platelets: 420 10*3/uL — ABNORMAL HIGH (ref 150–400)
RBC: 4.47 MIL/uL (ref 4.22–5.81)
RDW: 13.1 % (ref 11.5–15.5)
WBC: 7 10*3/uL (ref 4.0–10.5)
nRBC: 0 % (ref 0.0–0.2)

## 2019-04-02 LAB — COMPREHENSIVE METABOLIC PANEL
ALT: 22 U/L (ref 0–44)
AST: 15 U/L (ref 15–41)
Albumin: 3.6 g/dL (ref 3.5–5.0)
Alkaline Phosphatase: 55 U/L (ref 38–126)
Anion gap: 11 (ref 5–15)
BUN: 17 mg/dL (ref 6–20)
CO2: 27 mmol/L (ref 22–32)
Calcium: 9.3 mg/dL (ref 8.9–10.3)
Chloride: 96 mmol/L — ABNORMAL LOW (ref 98–111)
Creatinine, Ser: 1.23 mg/dL (ref 0.61–1.24)
GFR calc Af Amer: >60 mL/min (ref >60)
GFR calc non Af Amer: >60 mL/min (ref >60)
Glucose, Bld: 180 mg/dL — ABNORMAL HIGH (ref 70–99)
Potassium: 4.7 mmol/L (ref 3.5–5.1)
Sodium: 134 mmol/L — ABNORMAL LOW (ref 135–145)
Total Bilirubin: 0.7 mg/dL (ref 0.3–1.2)
Total Protein: 7.1 g/dL (ref 6.5–8.1)

## 2019-04-02 LAB — I-STAT CREATININE, ED: Creatinine, Ser: 1.3 mg/dL — ABNORMAL HIGH (ref 0.61–1.24)

## 2019-04-02 LAB — CBG MONITORING, ED: Glucose-Capillary: 166 mg/dL — ABNORMAL HIGH (ref 70–99)

## 2019-04-02 MED ORDER — TRAMADOL HCL 50 MG PO TABS: 50.0000 mg | ORAL_TABLET | Freq: Four times a day (QID) | ORAL | 0 refills | Status: DC | PRN

## 2019-04-02 MED ORDER — IOHEXOL 300 MG/ML  SOLN
75.0000 mL | Freq: Once | INTRAMUSCULAR | Status: AC | PRN
  Administered 2019-04-02: 75 mL via INTRAVENOUS

## 2019-04-02 MED ORDER — CLINDAMYCIN PHOSPHATE 600 MG/50ML IV SOLN
600.0000 mg | Freq: Once | INTRAVENOUS | Status: AC
  Administered 2019-04-02: 600 mg via INTRAVENOUS
  Filled 2019-04-02: qty 50

## 2019-04-02 MED ORDER — CLINDAMYCIN HCL 150 MG PO CAPS: ORAL_CAPSULE | ORAL | 0 refills | Status: DC

## 2019-04-02 NOTE — ED Triage Notes (Signed)
Pt here for generalized weakness since yesterday. Is also having left lower tooth ache and ear pain. NAD.

## 2019-04-02 NOTE — Discharge Instructions (Addendum)
Your CT scan confirms a dental abscess.  It is extremely important that you contact your dentist tomorrow, June 25 so that this abscess can have dental expertise.  Please use clindamycin 2 tablets 2 times daily with food.  Use 2 extra strength Tylenol with breakfast, lunch, dinner, and at bedtime.  Use Ultram every 6 hours as needed for more severe pain. This medication may cause drowsiness. Please do not drink, drive, or participate in activity that requires concentration while taking this medication.

## 2019-04-02 NOTE — ED Provider Notes (Signed)
[facility] EMERGENCY DEPARTMENT Provider Note   CSN: [id number] Arrival date & time: 04/02/19  1509     History   Chief Complaint No chief complaint on file.   HPI Dustin Cruz is a 53 y.o. male.     HPI   Patient is a 53 year old male with a past medical history of COPD, coronary artery disease status post BMS stenting, type 2 diabetes mellitus on insulin, hypercoagulability on Eliquis for recent diagnosis of splenic infarcts presenting for dental pain and infection.  Patient reports that he had a toothache for several days and 1 of his front teeth.  He and his fiance began to try to remove the tooth with pliers yesterday.  He reports that subsequently he developed swelling around the tooth and underneath his jaw.  Denies any purulent drainage.  Denies any fevers, chills, difficulty breathing or difficulty swallowing, nausea or vomiting.  He does report he has felt generally weak today.  His glucose has been slightly elevated but not significantly elevated at home.  Past Medical History:  Diagnosis Date  . Anxiety   . Asthma   . CKD stage 3 due to type 2 diabetes mellitus ([place])   . COPD (chronic obstructive pulmonary disease) ([place])   . Coronary artery disease    s/p BMS to Ramus 10/2010;  Cath 01/22/12 prox 30-40% LAD, LCx ramus w/ hazy 70-80% in-stent restenosis, EF 60% treated medically  . Coronary artery disease    s/p BMS to Ramus 10/2010;  Cath 01/22/12 prox 30-40% LAD, LCx ramus w/ hazy 70-80% in-stent restenosis, EF 60% treated medically   . Depression   . Diabetes mellitus    Type 2  . Gastroparesis 05/2013  . GERD (gastroesophageal reflux disease)   . HTN (hypertension)   . Hyperlipidemia   . Hyperlipidemia   . Major depression, chronic 08/13/2012  . Melanoma ([place]) 2007   surgery at [facility], Followed by [place]  . Myocardial infarction ([place]) 2012  . Obesity   . Tobacco abuse   . Tubular adenoma   . Urinary retention     Patient Active Problem List   Diagnosis Date Noted  . Essential HTN (hypertension) 03/20/2019  . 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 Near syncope 03/17/2019  . AKI (acute kidney injury) ([place]) 03/17/2019  . 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 CKD stage 3 due to type 2 diabetes mellitus ([place]) 11/28/2018  . Microalbuminuria due to type 2 diabetes mellitus ([place]) 05/22/2018  . Hyperglycemia 04/24/2018  . Abdominal pain, epigastric 11/29/2017  . Diarrhea 11/29/2017  . History of colonic polyps 11/29/2017  . Diffuse abdominal pain 11/29/2017  . Diabetic retinopathy ([place]) 09/21/2015  . Carotid bruit 09/21/2015  . Viral gastroenteritis 06/03/2015  . Hypertriglyceridemia 05/18/2015  . Vitamin D deficiency 12/29/2013  . Nausea alone 04/24/2013  . Frequency of urination 04/14/2013  . Fever, unspecified 04/14/2013  . Hepatic steatosis 12/28/2012  . Peripheral neuropathy 11/25/2012  . Melanoma of skin, site unspecified 11/25/2012  . Hyperlipidemia   . Depression   . Hypertension associated with diabetes ([place])   . GERD (gastroesophageal reflux disease) 04/03/2012  . Tobacco abuse 03/14/2012  . Diabetes mellitus, type 2 ([place])   . 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 BIOPSY  01/07/2018   Procedure: BIOPSY;  Surgeon: Daneil Dolin, MD;  Location[facility] END[facility];  Service: Endoscopy;;  duodenal and gastric biopsy  . BIOPSY  04/25/2018   Procedure: BIOPSY;  Surgeon:  Daneil Dolin, MD;  Location: [facility] ENDO SU[facility];  Service: Endoscopy;;  ascending and descending and sigmoid biopsies  . CARDIAC CATHETERIZATION     with stent  . COLONOSCOPY N/A 01/27/2013   KGU:RKYHCW and colonic polyps. Tubular adenomas, poor bowel prep, one-year follow-up surveillance colonoscopy recommended  . COLONOSCOPY WITH PROPOFOL N/A 04/25/2018   Procedure: COLONOSCOPY WITH PROPOFOL;  Surgeon: Daneil Dolin, MD;  Location: [facility] ENDO SU[facility];  Service: Endoscopy;  Laterality: N/A;  11:00am  - pt to be prepped INPT and labs to be done as well  . CORONARY ANGIOPLASTY WITH STENT PLACEMENT    . ESOPHAGOGASTRODUODENOSCOPY  04/24/2012   [place]ourk-mild erosive reflux esophagitis,dilated w/17F Venia Minks, small HH, minimal chronic gastric/bulbar erosions(No H pylori)  . ESOPHAGOGASTRODUODENOSCOPY (EGD) WITH PROPOFOL N/A 01/07/2018   Dr. Gala Romney: Erythematous mucosa in the stomach, retained gastric contents, incomplete exam.  Biopsied showed mild chronic gastritis but no H. pylori.  Duodenal biopsy was negative for celiac disease.  [name] FLEXIBLE SIGMOIDOSCOPY N/A 01/07/2018   Dr. Gala Romney: Incomplete colonoscopy due to inadequate bowel prep  . LEFT HEART CATHETERIZATION WITH CORONARY ANGIOGRAM N/A 01/22/2012   Procedure: LEFT HEART CATHETERIZATION WITH CORONARY ANGIOGRAM;  Surgeon: Jolaine Artist, MD;  Location[facility] C[facility] LAB;  Service: Cardiovascular;  Laterality: N/A;  . melanoma surgery  2007   [place], removed lymph nodes under arm as well,Left abd        Home Medications    Prior to Admission medications   Medication Sig Start Date End Date Taking? Authorizing Provider  aspirin EC 81 MG tablet Take 1 tablet (81 mg total) by mouth daily with breakfast. 03/21/19  Yes Emokpae, Courage, MD  atorvastatin (LIPITOR) 80 MG tablet Take 1 tablet by mouth once daily Patient taking differently: Take 80 mg by mouth every morning.  01/08/19  Yes Ronnie Doss M, DO  acetaminophen (TYLENOL) 325 MG tablet Take 2 tablets (650 mg total) by mouth every 6 (six) hours as needed for mild pain, moderate pain or fever (or Fever >/= 101). 03/21/19   Roxan Hockey, MD  albuterol (PROVENTIL) (2.5 MG/3ML) 0.083% nebulizer solution Take 3 mLs (2.5 mg total) by nebulization every 6 (six) hours as needed for wheezing or shortness of breath. Dx---44.1 03/21/19   Roxan Hockey, MD  albuterol (VENTOLIN HFA) 108 (90 Base) MCG/ACT inhaler Inhale 2 puffs into the lungs every 6 (six) hours as needed for wheezing. Dx--44.1 03/21/19    Roxan Hockey, MD  apixaban (ELIQUIS) 5 MG TABS tablet Take 1 tablet (5 mg total) by mouth 2 (two) times daily. Start around July 10th after you complete  the initial starter Myren Prescription 04/18/19   Roxan Hockey, MD  busPIRone (BUSPAR) 7.5 MG tablet Take 1 tablet (7.5 mg total) by mouth 2 (two) times daily. 11/22/18   [name], DO  Eliquis DVT/PE Starter [place] (ELIQUIS STARTER [place]) 5 MG TABS Take as directed on package: start with two-5mg  tablets twice daily for 7 days. On day 8, switch to one-5mg  tablet twice daily. 03/21/19   Roxan Hockey, MD  fenofibrate (TRICOR) 145 MG tablet Take 1 tablet (145 mg total) by mouth daily. 04/17/18   [name], DO  furosemide (LASIX) 20 MG tablet Take 1 tablet (20 mg total) by mouth daily. 10/07/17   Julianne Rice, MD  gabapentin (NEURONTIN) 600 MG tablet Take 2 tablets (1,200 mg total) by mouth 2 (two) times daily. 03/21/19   Emokpae, Courage, MD  insulin aspart (NOVOLOG FLEXPEN) 100 UNIT/ML FlexPen Inject 10  Units into the skin 3 (three) times daily with meals. Inject up to 10 units prior to meal per sliding scale. Patient not taking: Reported on 03/17/2019 09/21/17   Timmothy Euler, MD  Insulin Glargine (LANTUS SOLOSTAR) 100 UNIT/ML Solostar Pen Inject 60 Units into the skin every evening. 03/21/19   Roxan Hockey, MD  lisinopril (ZESTRIL) 5 MG tablet TAKE 1 TABLET BY MOUTH AT BEDTIME Patient taking differently: Take 5 mg by mouth at bedtime.  02/17/19   [name], DO  loratadine (CLARITIN) 10 MG tablet Take 10 mg by mouth daily as needed for allergies.    [provider]  metFORMIN (GLUCOPHAGE) 1000 MG tablet Take 1 tablet (1,000 mg total) by mouth 2 (two) times daily with a meal. 03/22/19 03/21/20  Emokpae, Courage, MD  nitroGLYCERIN (NITROSTAT) 0.4 MG SL tablet Place 1 tablet (0.4 mg total) under the tongue every 5 (five) minutes as needed. For chest pain. 08/13/12   Reece Packer, NP  omeprazole  (PRILOSEC) 40 MG capsule TAKE 1 CAPSULE BY MOUTH TWICE DAILY Patient taking differently: TAKE 1 CAPSULE BY MOUTH at bedtime 04/09/18   Ronnie Doss M, DO  sertraline (ZOLOFT) 100 MG tablet Take 1 tablet (100 mg total) by mouth daily. 09/21/17   Timmothy Euler, MD  tamsulosin (FLOMAX) 0.4 MG CAPS capsule Take 1 capsule (0.4 mg total) by mouth daily after breakfast. 03/21/19   Denton Brick, Courage, MD  traZODone (DESYREL) 150 MG tablet TAKE 1 TABLET BY MOUTH ONCE DAILY AT BEDTIME Patient taking differently: Take 150 mg by mouth at bedtime.  01/14/19   [name], DO  metoprolol tartrate (LOPRESSOR) 25 MG tablet Take 25 mg by mouth 2 (two) times daily.    12/27/11  [provider]    Family History Family History  Problem Relation Age of Onset  . Stroke Mother   . Alcohol abuse Father   . Lung cancer Other   . Heart disease Other   . Other Other        not real familiar with family history  . Colon cancer Neg Hx   . Colon polyps Neg Hx     Social History Social History   Tobacco Use  . Smoking status: Current Every Day Smoker    Packs/day: 1.00    Years: 31.00    [place] years: 31.00    Types: Cigarettes    Start date: 10/09/1981  . Smokeless tobacco: Never Used  . Tobacco comment: 1/2 [place] daily 11/01/17  Substance Use Topics  . 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  . Drug use: No     Allergies   Patient has no known allergies.   Review of Systems Review of Systems  Constitutional: Negative for chills and fever.  HENT: Positive for dental problem and facial swelling. Negative for congestion, sore throat, trouble swallowing and voice change.   Eyes: Negative for visual disturbance.  Respiratory: Negative for cough, chest tightness and shortness of breath.   Cardiovascular: Negative for chest pain.  Gastrointestinal: Negative for  abdominal pain, nausea and vomiting.  Genitourinary: Negative for dysuria and flank pain.  Musculoskeletal: Negative for back pain and myalgias.  Skin: Negative for rash.  Neurological: Negative for dizziness, syncope and light-headedness.     Physical Exam Updated Vital Signs BP 124/75   Pulse 95   Temp 97.9  F (36.6 C)   Resp 12   Wt 88.5 kg   SpO2 97%   BMI 25.05 kg/m   Physical Exam Vitals signs and nursing note reviewed.  Constitutional:      General: He is not in acute distress.    Appearance: He is well-developed.  HENT:     Head: Normocephalic and atraumatic.     Right Ear: Tympanic membrane normal.     Left Ear: Tympanic membrane normal.     Mouth/Throat:     Mouth: Mucous membranes are moist.     Pharynx: No oropharyngeal exudate or posterior oropharyngeal erythema.      Comments: Dental cavities and several missing teeth, poor oral dentition noted. Pain along tooth as depicted in image. No abscess noted. Midline uvula. No trismus. OP clear and moist. No oropharyngeal erythema or edema. Neck supple with no tenderness.  Patient does have some mild swelling in the submandibular region.  No neck induration. Eyes:     Conjunctiva/sclera: Conjunctivae normal.     Pupils: Pupils are equal, round, and reactive to light.  Neck:     Musculoskeletal: Normal range of motion and neck supple.  Cardiovascular:     Rate and Rhythm: Normal rate and regular rhythm.     Heart sounds: S1 normal and S2 normal. No murmur.  Pulmonary:     Effort: Pulmonary effort is normal.     Breath sounds: Normal breath sounds. No wheezing or rales.  Abdominal:     General: There is no distension.     Palpations: Abdomen is soft.     Tenderness: There is no abdominal tenderness. There is no guarding.  Musculoskeletal: Normal range of motion.        General: No deformity.  Lymphadenopathy:     Cervical: No cervical adenopathy.  Skin:    General: Skin is warm and dry.     Findings: No  erythema or rash.  Neurological:     Mental Status: He is alert.     Comments: Cranial nerves grossly intact. Patient moves extremities symmetrically and with good coordination.  Psychiatric:        Behavior: Behavior normal.        Thought Content: Thought content normal.        Judgment: Judgment normal.      ED Treatments / Results  Labs (all labs ordered are listed, but only abnormal results are displayed) Labs Reviewed  CBC WITH DIFFERENTIAL/PLATELET - Abnormal; Notable for the following components:      Result Value   Platelets 420 (*)    All other components within normal limits  COMPREHENSIVE METABOLIC PANEL - Abnormal; Notable for the following components:   Sodium 134 (*)    Chloride 96 (*)    Glucose, Bld 180 (*)    All other components within normal limits  CBG MONITORING, ED - Abnormal; Notable for the following components:   Glucose-Capillary 166 (*)    All other components within normal limits  I-STAT CREATININE, ED - Abnormal; Notable for the following components:   Creatinine, Ser 1.30 (*)    All other components within normal limits    EKG    Radiology No results found.  Procedures Procedures (including critical care time)  Medications Ordered in ED Medications  clindamycin (CLEOCIN) IVPB 600 mg (600 mg Intravenous New Bag/Given 04/02/19 1626)     Initial Impression / Assessment and Plan / ED Course  I have reviewed the triage vital signs and the nursing notes.  Pertinent labs & imaging results that were available during my care of the patient were reviewed by me and considered in my medical decision making (see chart for details).        This is a medically complex 53 year old male with past medical history significant for CAD, type 2 diabetes mellitus, hypercoagulability with recent diagnosis of splenic infarct now on Eliquis presenting for dental pain and facial swelling.  Given the rapidity of swelling, do have concerns for underlying  dental abscess.  Given that he does have some mild swelling in the submandibular region, will obtain CT neck with contrast to rule out deep space infection or Ludwig's angina.  Work-up thus far demonstrating normal WBC count.  He has slight thrombocytopenia at 420, new.  Creatinine at baseline at 1.30.  He is not significantly hyperglycemic.  CT soft tissue neck pending at time of signout.  Care signed out to Lily Kocher, PA-C at 4:56 PM to follow results of CT and make final disposition.   This is a shared visit with Dr. [name]. Patient was independently evaluated by this attending physician. Attending physician consulted in evaluation and management.  Final Clinical Impressions(s) / ED Diagnoses   Final diagnoses:  Dental infection    ED Discharge Orders    None       Tamala Julian 04/02/19 1715    [name], MD 04/03/19 1719

## 2019-04-02 NOTE — Chronic Care Management (AMB) (Signed)
  Chronic Care Management    Clinical Social Work CCM Outreach Note  04/02/2019 Name: Dustin Cruz MRN: [id number] DOB: 10-02-1966  Dustin Cruz is a 53 y.o. year old male who is a primary care patient of Dustin Norlander, DO . The CCM team was consulted for assistance with assessment of psychosocial needs.   LCSW reached out to Dustin Cruz other, Dustin Cruz today by phone   Social Determinants of Health:Risk for Stress; Risk for Food Insecurity; Risk for tobacco exposure    Office Visit from 03/19/2019 in [place]  PHQ-9 Total Score  0     Goals        . Client said he had dental care issues and needs to see dentist for care (pt-stated)     Barriers:  Dental pain issues Financial Challenges  Clinical Social Work Goal: Over next 30 days , client  will work with LCSW to address concerns related to dental care needs of client  Interventions: LCSW encouraged client to communicate with RNCM regarding nursing needs of client Previously provided patient with information regarding dentist, Dr. Costella Cruz, [phone], for patient to contact to schedule client dental appointment Encouraged patient to call Dr. Costella Cruz to schedule client dental appointment.  Talked with client about current dental needs of client and dental resources for client in the area   Patient Self Care Activities: Self administers medications as prescribed Attends all scheduled provider appointments Performs ADL's independently  Plan: Client to call LCSW as needed to discuss psychosocial needs of client LCSW to call client in next 3 weeks to discuss current client needs including dental needs of client Client to communicate with RN CM to discuss nursing needs of client.  Client to call dentist, Dr. Costella Cruz , at [phone], to schedule client dental appointment     Client has been hospitlalized recently and is now back home. He has reported that he is  not sleeping well, has a decreased appetite , is fatigued and has some mobility issues. He is also talking about tooth pain issue. He has his prescribed medications and is taking medications as prescribed.  Client has support from his significant other , Dustin Cruz. Client said he is not receiving home health services at this time. Client said he has a medical appointment on 04/08/2019 in [place]. Client takes Eliquis as prescribed for blood clots (stomach and legs).  Client does not have a fever currently. LCSW encouraged client to call Dustin Cruz Triage Nurse , as needed , to discuss nursing needs of client.   Follow Up Plan: LCSW to call client in next 3 weeks to discuss client needs , including dental needs of client  Dustin Cruz.Dustin Cruz MSW, LCSW Licensed Clinical Social Worker [facility] Care Management [phone]

## 2019-04-02 NOTE — Telephone Encounter (Signed)
Pt is having worsening fatigue and abdominal pain Pt notified to go to ED Verbalizes understanding

## 2019-04-02 NOTE — Patient Instructions (Signed)
Licensed Clinical Social Worker Visit Information  Goals we discussed today:  Goals        . Client said he had dental care issues and needs to see dentist for care (pt-stated)     Barriers:  Dental pain issues Financial Challenges  Clinical Social Work Goal: Over next 30 days , client  will work with LCSW to address concerns related to dental care needs of client  Interventions: LCSW encouraged client to communicate with RNCM regarding nursing needs of client Previously provided patient with information regarding dentist, Dr. Costella Hatcher, [phone], for patient to contact to schedul eclient dental appointment Encouraged patient to call Dr. Costella Hatcher to schedule client dental appointment.  Talked with client about current dental needs of client and dental resources for client in the area  Patient Self Care Activities: Self administers medications as prescribed Attends all scheduled provider appointments Performs ADL's independently  Plan: Client to call LCSW as needed to discuss psychosocial needs of client LCSW to call client in next 3 weeks to discuss current client needs including dental needs of client Client to communicate with RN CM to discuss nursing needs of client.  Client to call dentist, Dr. Costella Hatcher , at [phone], to schedule client dental appointment        Materials Provided: No  Follow Up Plan: LCSW to call client in next 3 weeks to discuss current needs of client  Including dental needs of client  The patient verbalized understanding of instructions provided today and declined a print copy of patient instruction materials.   Norva Riffle.Harvy Riera MSW, LCSW Licensed Clinical Social Worker [facility] Care Management [phone]

## 2019-04-02 NOTE — ED Provider Notes (Signed)
CONTINUING CARE FROM A.MURRAY, PA-C.  Patient is a 53 year old male who presented to the emergency department with complaint of facial swelling and pain and tooth ache.  It is of note that the patient is a diabetic, he is also on Eliquis for splenic infarcts.  The patient had tooth ache for several days, and on yesterday June 23 the patient attempted to remove the tooth with pliers.  He has now developed swelling around the tooth, and underneath his jaw.  Patient also complains of some generalized weakness.  Work-up is in progress.  CT scan is pending.  CT scan reveals poor dentition.  There is a 14 mm subperiosteal abscess associated with the left lateral maxillary incisor.    I have discussed the findings of the CT scans with the patient in terms which he understands.  Antibiotics have been started.  Medication for pain ordered.  I strongly encouraged the patient to see a dentist tomorrow or soon as possible.  Patient assures me he understands the importance of seeing the dentist and the danger of not proceeding aggressively with treatment for this abscess area.  Patient will return to the emergency department if any changes in his condition, worsening of his symptoms, problems, or concerns.   Lily Kocher, PA-C 04/04/19 1259    [name], MD 04/07/19 1534

## 2019-04-02 NOTE — ED Notes (Addendum)
Charted in error.

## 2019-04-07 ENCOUNTER — Telehealth: Payer: Self-pay | Admitting: Family Medicine

## 2019-04-07 ENCOUNTER — Encounter (HOSPITAL_COMMUNITY): Payer: Self-pay | Admitting: *Deleted

## 2019-04-07 NOTE — Telephone Encounter (Signed)
Patient wants to know what he should do about getting his tooth pulled he has called everywhere and nobody will see him. He went to ER on 06/24 and found a 14 mm subperiosteal abscess associated with the left lateral maxillary incisor. Patient aware that we can not give permission for his girl friend to go with him tomorrow. He is aware that our office is also under the same restriction.

## 2019-04-07 NOTE — Telephone Encounter (Signed)
Thank you.  Yes, this is an unfortunate time for loved ones wanting to accompany their family members.

## 2019-04-08 ENCOUNTER — Inpatient Hospital Stay (HOSPITAL_COMMUNITY): Payer: Medicare Other | Attending: Hematology | Admitting: Hematology

## 2019-04-08 ENCOUNTER — Inpatient Hospital Stay (HOSPITAL_COMMUNITY): Payer: Medicare Other

## 2019-04-08 ENCOUNTER — Encounter (HOSPITAL_COMMUNITY): Payer: Self-pay | Admitting: Hematology

## 2019-04-08 ENCOUNTER — Other Ambulatory Visit: Payer: Self-pay

## 2019-04-08 VITALS — BP 120/50 | HR 82 | Temp 97.5°F | Resp 18 | Wt 194.5 lb

## 2019-04-08 DIAGNOSIS — J45909 Unspecified asthma, uncomplicated: Secondary | ICD-10-CM | POA: Insufficient documentation

## 2019-04-08 DIAGNOSIS — E785 Hyperlipidemia, unspecified: Secondary | ICD-10-CM | POA: Insufficient documentation

## 2019-04-08 DIAGNOSIS — F17210 Nicotine dependence, cigarettes, uncomplicated: Secondary | ICD-10-CM

## 2019-04-08 DIAGNOSIS — Z7901 Long term (current) use of anticoagulants: Secondary | ICD-10-CM | POA: Diagnosis not present

## 2019-04-08 DIAGNOSIS — Z79899 Other long term (current) drug therapy: Secondary | ICD-10-CM | POA: Insufficient documentation

## 2019-04-08 DIAGNOSIS — D735 Infarction of spleen: Secondary | ICD-10-CM | POA: Diagnosis not present

## 2019-04-08 DIAGNOSIS — F329 Major depressive disorder, single episode, unspecified: Secondary | ICD-10-CM | POA: Insufficient documentation

## 2019-04-08 DIAGNOSIS — E1122 Type 2 diabetes mellitus with diabetic chronic kidney disease: Secondary | ICD-10-CM | POA: Diagnosis not present

## 2019-04-08 DIAGNOSIS — N183 Chronic kidney disease, stage 3 (moderate): Secondary | ICD-10-CM

## 2019-04-08 DIAGNOSIS — E8889 Other specified metabolic disorders: Secondary | ICD-10-CM | POA: Insufficient documentation

## 2019-04-08 DIAGNOSIS — K029 Dental caries, unspecified: Secondary | ICD-10-CM | POA: Diagnosis not present

## 2019-04-08 DIAGNOSIS — J9811 Atelectasis: Secondary | ICD-10-CM | POA: Diagnosis not present

## 2019-04-08 DIAGNOSIS — K760 Fatty (change of) liver, not elsewhere classified: Secondary | ICD-10-CM | POA: Diagnosis not present

## 2019-04-08 DIAGNOSIS — R339 Retention of urine, unspecified: Secondary | ICD-10-CM | POA: Diagnosis not present

## 2019-04-08 DIAGNOSIS — E669 Obesity, unspecified: Secondary | ICD-10-CM | POA: Insufficient documentation

## 2019-04-08 DIAGNOSIS — I252 Old myocardial infarction: Secondary | ICD-10-CM | POA: Diagnosis not present

## 2019-04-08 DIAGNOSIS — I2510 Atherosclerotic heart disease of native coronary artery without angina pectoris: Secondary | ICD-10-CM | POA: Insufficient documentation

## 2019-04-08 DIAGNOSIS — Z7984 Long term (current) use of oral hypoglycemic drugs: Secondary | ICD-10-CM | POA: Diagnosis not present

## 2019-04-08 DIAGNOSIS — I129 Hypertensive chronic kidney disease with stage 1 through stage 4 chronic kidney disease, or unspecified chronic kidney disease: Secondary | ICD-10-CM | POA: Insufficient documentation

## 2019-04-08 DIAGNOSIS — Z85820 Personal history of malignant melanoma of skin: Secondary | ICD-10-CM

## 2019-04-08 DIAGNOSIS — K219 Gastro-esophageal reflux disease without esophagitis: Secondary | ICD-10-CM | POA: Insufficient documentation

## 2019-04-08 LAB — LACTATE DEHYDROGENASE: LDH: 131 U/L (ref 98–192)

## 2019-04-08 NOTE — Progress Notes (Signed)
Faxed medical records request to [facility] [phone].  Requested records from 2006-2007 for Dr. Ronnette Hila.

## 2019-04-08 NOTE — Patient Instructions (Addendum)
[place] at [facility] Discharge Instructions  You were seen today by Dr. Delton Coombes. He went over your history, family history and how you've been feeling lately.  He will see you back in 2 weeks for labs and follow up.   Thank you for choosing [place] at [facility] to provide your oncology and hematology care.  To afford each patient quality time with our provider, please arrive at least 15 minutes before your scheduled appointment time.   If you have a lab appointment with the [facility] please come in thru the  Main Entrance and check in at the main information desk  You need to re-schedule your appointment should you arrive 10 or more minutes late.  We strive to give you quality time with our providers, and arriving late affects you and other patients whose appointments are after yours.  Also, if you no show three or more times for appointments you may be dismissed from the clinic at the providers discretion.     Again, thank you for choosing [facility].  Our hope is that these requests will decrease the amount of time that you wait before being seen by our physicians.       _____________________________________________________________  Should you have questions after your visit to [facility], please contact our office at [phone] between the hours of 8:00 a.m. and 4:30 p.m.  Voicemails left after 4:00 p.m. will not be returned until the following business day.  For prescription refill requests, have your pharmacy contact our office and allow 72 hours.    Cancer Center Support Programs:   > Cancer Support Group  2nd Tuesday of the month 1pm-2pm, Journey Room

## 2019-04-08 NOTE — Assessment & Plan Note (Addendum)
1.  Multiple splenic infarcts: - Patient came to the ER with abdominal pain. - CT angiogram CAP for dissection on 03/17/2019 shows multiple splenic infarcts.  Etiology is uncertain.  Splenic artery was patent without obstructive disease.  There is only minimal aortic atherosclerosis proximal to the level of splenic artery.  Hepatic steatosis.  Spleen was normal in size and contour. - Patient started on anticoagulation, currently on Eliquis.  Never had a history of prior thrombosis.  No family history of thromboembolism. - Hypercoagulable work-up including protein C, protein S, Antithrombin III, factor V Leiden and prothrombin gene mutation were negative. - Homocystine was moderately elevated at 27.6. - Patient having toothache for the past few weeks.  A CT of the soft tissues of the neck on 04/02/2019 shows 14 mm subperiosteal abscess associated with left lateral maxillary incisor.  As a result he apparently lost about 5 to 10 pounds as he could not eat.  Otherwise he denies any fevers or night sweats. -No bleeding issues while on anticoagulation with Eliquis.  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 -He is scheduled for a TEE as an outpatient by cardiology. 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  At this time I would recommend long-term anticoagulation. -I would check Jak 2 V617F and a factor VIII antigen levels to complete the work-up.  Anticardiolipin antibodies and lupus anticoagulant were also negative.  2.  Malignant melanoma: - Patient reportedly had melanoma in the left upper quadrant resected along with left axillary lymph node dissection, done at [facility] in the early 2000's. -We will request records of it. -Recent CT CAP did not reveal any evidence of metastatic disease.  3.  Smoking history: - He smoked 1 and half Freundlich per day for 45 years.  Since the recent hospitalization, he cut it back  to half Shadd per day. -Recent CT of the chest did not report any lung nodules.

## 2019-04-08 NOTE — Telephone Encounter (Signed)
Oh, I thought he had an appointment with Dustin Cruz for the tooth.  He needs to have the abscess drained.  That is the treatment.  I can prescribe antibiotics if the ED didn't already.  I will cc Zigmund Daniel to see if she has any resources he can try re: dentistry.  He may have to go back to the ED to see if he can get a dental consult there.

## 2019-04-08 NOTE — Progress Notes (Signed)
CONSULT NOTE  Patient Care Team: [name], DO as PCP - General (Family Medicine) Danie Binder, MD (Gastroenterology) Gala Romney Cristopher Estimable, MD as Consulting Physician (Gastroenterology) Shea Evans Norva Riffle, LCSW as Social Worker (Licensed Clinical Social Worker) Ilean China, RN as Case Manager  CHIEF COMPLAINTS/PURPOSE OF CONSULTATION:  Multiple splenic infarcts.  HISTORY OF PRESENTING ILLNESS:  Dustin Cruz 53 y.o. male is seen in consultation today for further work-up and management of multiple splenic infarcts.  He recently developed abdominal pain and went to the ER.  CT CAP angiogram for dissection on 03/17/2019 showed multiple splenic infarcts, etiology uncertain.  He was started on blood thinner and currently on Eliquis.  He also had a history of MI in 2012, status post stent placement.  He is also on aspirin.  Never had a history of thrombosis.  No family history.  He mentions history of malignant melanoma resected from the left upper abdomen in early 2000's at [facility].  Lymph node dissection in the left axillary area was also done.  He has been tolerating Eliquis very well.  He also reports toothache for the last few weeks.  His CT recently showed 14 mm subperiosteal abscess.  He reports 5 to 10 pound weight loss because he is not able to eat well.  He apparently could not find a dentist to help with it.  He will be evaluated by cardiology for outpatient TEE.  Does not report any skin rashes or joint pains.  He lives at home with his girlfriend.  He does not have any children.  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  He is a current active smoker, smoking half Dahm cigarettes per day since he was discharged from the hospital.  Prior to that he smoked 1 and half Rashid per day for 45 years.  He quit drinking alcohol about 15 years ago.  MEDICAL HISTORY:  Past Medical History:  Diagnosis Date  . Anxiety   . Asthma   . CKD stage 3 due to type 2 diabetes  mellitus ([place])   . COPD (chronic obstructive pulmonary disease) ([place])   . Coronary artery disease    s/p BMS to Ramus 10/2010;  Cath 01/22/12 prox 30-40% LAD, LCx ramus w/ hazy 70-80% in-stent restenosis, EF 60% treated medically  . Coronary artery disease    s/p BMS to Ramus 10/2010;  Cath 01/22/12 prox 30-40% LAD, LCx ramus w/ hazy 70-80% in-stent restenosis, EF 60% treated medically   . Depression   . Diabetes mellitus    Type 2  . Gastroparesis 05/2013  . GERD (gastroesophageal reflux disease)   . HTN (hypertension)   . Hyperlipidemia   . Hyperlipidemia   . Major depression, chronic 08/13/2012  . Melanoma ([place]) 2007   surgery at [facility], Followed by [place]  . Myocardial infarction ([place]) 2012  . Obesity   . Tobacco abuse   . Tubular adenoma   . Urinary retention     SURGICAL HISTORY: Past Surgical History:  Procedure Laterality Date  . BIOPSY  01/07/2018   Procedure: BIOPSY;  Surgeon: Daneil Dolin, MD;  Location[facility];  Service: Endoscopy;;  duodenal and gastric biopsy  . BIOPSY  04/25/2018   Procedure: BIOPSY;  Surgeon: Daneil Dolin, MD;  Location[facility];  Service: Endoscopy;;  ascending and descending and sigmoid biopsies  . CARDIAC CATHETERIZATION     with stent  . COLONOSCOPY N/A 01/27/2013   XBL:TJQZES and colonic  polyps. Tubular adenomas, poor bowel prep, one-year follow-up surveillance colonoscopy recommended  . COLONOSCOPY WITH PROPOFOL N/A 04/25/2018   Procedure: COLONOSCOPY WITH PROPOFOL;  Surgeon: Daneil Dolin, MD;  Location: [facility];  Service: Endoscopy;  Laterality: N/A;  11:00am - pt to be prepped INPT and labs to be done as well  . CORONARY ANGIOPLASTY WITH STENT PLACEMENT    . ESOPHAGOGASTRODUODENOSCOPY  04/24/2012   Rourk-mild erosive reflux esophagitis,dilated w/6F Venia Minks, small HH, minimal chronic gastric/bulbar erosions(No H pylori)  . ESOPHAGOGASTRODUODENOSCOPY (EGD) WITH PROPOFOL N/A 01/07/2018   Dr. Gala Romney: Erythematous mucosa  in the stomach, retained gastric contents, incomplete exam.  Biopsied showed mild chronic gastritis but no H. pylori.  Duodenal biopsy was negative for celiac disease.  [name] FLEXIBLE SIGMOIDOSCOPY N/A 01/07/2018   Dr. Gala Romney: Incomplete colonoscopy due to inadequate bowel prep  . LEFT HEART CATHETERIZATION WITH CORONARY ANGIOGRAM N/A 01/22/2012   Procedure: LEFT HEART CATHETERIZATION WITH CORONARY ANGIOGRAM;  Surgeon: Jolaine Artist, MD;  Location: [facility] CATH LAB;  Service: Cardiovascular;  Laterality: N/A;  . melanoma surgery  2007   [place], removed lymph nodes under arm as well,Left abd    SOCIAL HISTORY: Social History   Socioeconomic History  . Marital status: Divorced    Spouse name: rayann Stracener  . Number of children: 0  . Years of education: Not on file  . Highest education level: Not on file  Occupational History  . Occupation: disabled  Social Needs  . Financial resource strain: Somewhat hard  . Food insecurity    Worry: Sometimes true    Inability: Never true  . Transportation needs    Medical: No    Non-medical: Not on file  Tobacco Use  . Smoking status: Current Every Day Smoker    Packs/day: 1.00    Years: 31.00    [place] years: 31.00    Types: Cigarettes    Start date: 10/09/1981  . Smokeless tobacco: Never Used  . Tobacco comment: 1/2 Guerrier daily 11/01/17  Substance and Sexual Activity  . 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  . Drug use: No  . Sexual activity: Yes  Lifestyle  . Physical activity    Days per week: 7 days    Minutes per session: 30 min  . Stress: Rather much  Relationships  . Social connections    Talks on phone: More than three times a week    Gets together: More than three times a week    Attends religious service: Never    Active member of club or organization: No    Attends meetings of clubs or organizations: Never     Relationship status: Divorced  . Intimate partner violence    Fear of current or ex partner: No    Emotionally abused: No    Physically abused: No    Forced sexual activity: No  Other Topics Concern  . Not on file  Social History Narrative   Lives at home in an apartment alone. He is divorced and does not have any children. He has two brothers but is estranged from one. He only talks with the other one infrequently. He does have contact with his ex wife and they have a good relationship. They help each other out.     FAMILY HISTORY: Family History  Problem Relation Age of Onset  . Stroke Mother   .  Alcohol abuse Father   . Heart disease Other   . Other Other        not real familiar with family history  . Lung cancer Other   . Colon cancer Neg Hx   . Colon polyps Neg Hx     ALLERGIES:  has No Known Allergies.  MEDICATIONS:  Current Outpatient Medications  Medication Sig Dispense Refill  . acetaminophen (TYLENOL) 325 MG tablet Take 2 tablets (650 mg total) by mouth every 6 (six) hours as needed for mild pain, moderate pain or fever (or Fever >/= 101). 30 tablet 2  . albuterol (PROVENTIL) (2.5 MG/3ML) 0.083% nebulizer solution Take 3 mLs (2.5 mg total) by nebulization every 6 (six) hours as needed for wheezing or shortness of breath. Dx---44.1 75 mL 3  . albuterol (VENTOLIN HFA) 108 (90 Base) MCG/ACT inhaler Inhale 2 puffs into the lungs every 6 (six) hours as needed for wheezing. Dx--44.1 1 Inhaler 2  . aspirin EC 81 MG tablet Take 1 tablet (81 mg total) by mouth daily with breakfast. 30 tablet 5  . atorvastatin (LIPITOR) 80 MG tablet Take 1 tablet by mouth once daily (Patient taking differently: Take 80 mg by mouth every morning. ) 90 tablet 0  . busPIRone (BUSPAR) 7.5 MG tablet Take 1 tablet (7.5 mg total) by mouth 2 (two) times daily. 60 tablet 2  . clindamycin (CLEOCIN) 150 MG capsule 2 po bid with food 28 capsule 0  . Eliquis DVT/PE Starter [place] (ELIQUIS STARTER [place]) 5 MG  TABS Take as directed on package: start with two-5mg  tablets twice daily for 7 days. On day 8, switch to one-5mg  tablet twice daily. (Patient taking differently: Take 5-10 mg by mouth See admin instructions. Take as directed on package: start with two-5mg  tablets twice daily for 7 days. On day 8, switch to one-5mg  tablet twice daily.) 1 each 0  . fenofibrate (TRICOR) 145 MG tablet Take 1 tablet (145 mg total) by mouth daily. (Patient taking differently: Take 145 mg by mouth every evening. ) 90 tablet 3  . gabapentin (NEURONTIN) 600 MG tablet Take 2 tablets (1,200 mg total) by mouth 2 (two) times daily. 60 tablet 2  . insulin aspart (NOVOLOG FLEXPEN) 100 UNIT/ML FlexPen Inject 10 Units into the skin 3 (three) times daily with meals. Inject up to 10 units prior to meal per sliding scale. 15 mL 2  . Insulin Glargine (LANTUS SOLOSTAR) 100 UNIT/ML Solostar Pen Inject 60 Units into the skin every evening. 3 mL 2  . lisinopril (ZESTRIL) 5 MG tablet TAKE 1 TABLET BY MOUTH AT BEDTIME (Patient taking differently: Take 5 mg by mouth at bedtime. ) 90 tablet 0  . metFORMIN (GLUCOPHAGE) 1000 MG tablet Take 1 tablet (1,000 mg total) by mouth 2 (two) times daily with a meal. 60 tablet 11  . nitroGLYCERIN (NITROSTAT) 0.4 MG SL tablet Place 1 tablet (0.4 mg total) under the tongue every 5 (five) minutes as needed. For chest pain. 25 tablet 3  . sertraline (ZOLOFT) 100 MG tablet Take 1 tablet (100 mg total) by mouth daily. 90 tablet 3  . tamsulosin (FLOMAX) 0.4 MG CAPS capsule Take 1 capsule (0.4 mg total) by mouth daily after breakfast. 30 capsule 1  . traMADol (ULTRAM) 50 MG tablet Take 1 tablet (50 mg total) by mouth every 6 (six) hours as needed. 15 tablet 0  . traZODone (DESYREL) 150 MG tablet TAKE 1 TABLET BY MOUTH ONCE DAILY AT BEDTIME (Patient taking differently: Take 150 mg by  mouth at bedtime. ) 90 tablet 0  . [START ON 04/18/2019] apixaban (ELIQUIS) 5 MG TABS tablet Take 1 tablet (5 mg total) by mouth 2 (two)  times daily. Start around July 10th after you complete  the initial starter [place] Prescription 60 tablet 5  . omeprazole (PRILOSEC) 40 MG capsule TAKE 1 CAPSULE BY MOUTH TWICE DAILY (Patient not taking: Reported on 04/02/2019) 180 capsule 1   No current facility-administered medications for this visit.     REVIEW OF SYSTEMS:   Constitutional: Denies fevers, chills or abnormal night sweats Eyes: Denies blurriness of vision, double vision or watery eyes Ears, nose, mouth, throat, and face: Denies mucositis or sore throat Respiratory: Denies cough, dyspnea or wheezes Cardiovascular: Denies palpitation, chest discomfort or lower extremity swelling Gastrointestinal:  Denies nausea, heartburn or change in bowel habits Skin: Denies abnormal skin rashes Lymphatics: Denies new lymphadenopathy or easy bruising Neurological:Denies numbness, tingling or new weaknesses Behavioral/Psych: Mood is stable, no new changes  All other systems were reviewed with the patient and are negative.  PHYSICAL EXAMINATION: ECOG PERFORMANCE STATUS: 1 - Symptomatic but completely ambulatory  Vitals:   04/08/19 1313  BP: (!) 120/50  Pulse: 82  Resp: 18  Temp: (!) 97.5 F (36.4 C)  SpO2: 100%   Filed Weights   04/08/19 1313  Weight: 194 lb 8 oz (88.2 kg)    GENERAL:alert, no distress and comfortable SKIN: skin color, texture, turgor are normal, no rashes or significant lesions EYES: normal, conjunctiva are pink and non-injected, sclera clear OROPHARYNX:no exudate, no erythema and lips, buccal mucosa, and tongue normal  NECK: supple, thyroid normal size, non-tender, without nodularity LYMPH:  no palpable lymphadenopathy in the cervical, axillary or inguinal LUNGS: clear to auscultation and percussion with normal breathing effort HEART: regular rate & rhythm and no murmurs and no lower extremity edema ABDOMEN: Soft nontender with no palpable hepatosplenomegaly.  There is a scar on the left upper quadrant from  melanoma resection. Musculoskeletal:no cyanosis of digits and no clubbing  PSYCH: alert & oriented x 3 with fluent speech NEURO: no focal motor/sensory deficits  LABORATORY DATA:  I have reviewed the data as listed Recent Results (from the past 2160 hour(s))  Urinalysis, Complete     Status: Abnormal   Collection Time: 01/29/19  9:11 AM  Result Value Ref Range   Specific Gravity, UA 1.025 1.005 - 1.030   pH, UA 5.5 5.0 - 7.5   Color, UA Yellow Yellow   Appearance Ur Cloudy (A) Clear   Leukocytes,UA Trace (A) Negative   Protein,UA 3+ (A) Negative/Trace   Glucose, UA 3+ (A) Negative   Ketones, UA Negative Negative   RBC, UA 2+ (A) Negative   Bilirubin, UA Negative Negative   Urobilinogen, Ur 0.2 0.2 - 1.0 mg/dL   Nitrite, UA Negative Negative   Microscopic Examination See below:   Microscopic Examination     Status: Abnormal   Collection Time: 01/29/19  9:11 AM   URINE  Result Value Ref Range   WBC, UA >30 (H) 0 - 5 /hpf   RBC 0-2 0 - 2 /hpf   Epithelial Cells (non renal) 0-10 0 - 10 /hpf   Bacteria, UA Many (A) None seen/Few  GC/Chlamydia Probe Amp     Status: None   Collection Time: 01/29/19  9:12 AM   Specimen: Urethra; Urine   URINE  Result Value Ref Range   Chlamydia trachomatis, NAA Negative Negative   Neisseria Gonorrhoeae by PCR Negative Negative  Urine Culture  Status: Abnormal   Collection Time: 01/29/19  9:44 AM   Specimen: Urine   UR  Result Value Ref Range   Urine Culture, Routine Final report (A)    Organism ID, Bacteria Comment (A)     Comment: Staphylococcus capitis 25,000-50,000 colony forming units per mL Based on resistance to oxacillin this isolate would be resistant to all currently available beta-lactam antimicrobial agents, with the exception of the newer cephalosporins with anti-MRSA activity, such as Ceftaroline    Antimicrobial Susceptibility Comment     Comment:       ** S = Susceptible; I = Intermediate; R = Resistant **                     P = Positive; N = Negative             MICS are expressed in micrograms per mL    Antibiotic                 RSLT#1    RSLT#2    RSLT#3    RSLT#4 Ciprofloxacin                  S Gentamicin                     S Levofloxacin                   S Nitrofurantoin                 S Oxacillin                      R Penicillin                     R Rifampin                       I Tetracycline                   R Trimethoprim/Sulfa             R Vancomycin                     S   CBG monitoring, ED     Status: Abnormal   Collection Time: 03/17/19  1:26 PM  Result Value Ref Range   Glucose-Capillary 419 (H) 70 - 99 mg/dL  I-Stat Creatinine, ED (not at [facility])     Status: Abnormal   Collection Time: 03/17/19  2:12 PM  Result Value Ref Range   Creatinine, Ser 2.00 (H) 0.61 - 1.24 mg/dL  CBC     Status: Abnormal   Collection Time: 03/17/19  2:17 PM  Result Value Ref Range   WBC 10.8 (H) 4.0 - 10.5 K/uL   RBC 4.55 4.22 - 5.81 MIL/uL   Hemoglobin 14.5 13.0 - 17.0 g/dL   HCT 43.1 39.0 - 52.0 %   MCV 94.7 80.0 - 100.0 fL   MCH 31.9 26.0 - 34.0 pg   MCHC 33.6 30.0 - 36.0 g/dL   RDW 12.9 11.5 - 15.5 %   Platelets 328 150 - 400 K/uL   nRBC 0.0 0.0 - 0.2 %    Comment: Performed at [facility], [address] [place], [place] [address]  Troponin I - ONCE - STAT     Status: None   Collection Time: 03/17/19  2:17 PM  Result  Value Ref Range   Troponin I <0.03 <0.03 ng/mL    Comment: Performed at [facility], [address], [place], [place] [address]  Comprehensive metabolic panel     Status: Abnormal   Collection Time: 03/17/19  2:17 PM  Result Value Ref Range   Sodium 131 (L) 135 - 145 mmol/L   Potassium 4.7 3.5 - 5.1 mmol/L   Chloride 94 (L) 98 - 111 mmol/L   CO2 26 22 - 32 mmol/L   Glucose, Bld 390 (H) 70 - 99 mg/dL   BUN 26 (H) 6 - 20 mg/dL   Creatinine, Ser 2.09 (H) 0.61 - 1.24 mg/dL   Calcium 9.6 8.9 - 10.3 mg/dL   Total Protein 7.3 6.5 - 8.1 g/dL   Albumin 4.1 3.5 - 5.0 g/dL    AST 19 15 - 41 U/L   ALT 24 0 - 44 U/L   Alkaline Phosphatase 36 (L) 38 - 126 U/L   Total Bilirubin 0.7 0.3 - 1.2 mg/dL   GFR calc non Af Amer 35 (L) >60 mL/min   GFR calc Af Amer 41 (L) >60 mL/min   Anion gap 11 5 - 15    Comment: Performed at [facility], [address] [place], [place] [address]  CK     Status: Abnormal   Collection Time: 03/17/19  2:17 PM  Result Value Ref Range   Total CK 400 (H) 49 - 397 U/L    Comment: Performed at [facility], [address], [place], [place] [address]  SARS Coronavirus 2 (CEPHEID - Performed in [place] hospital lab), Hosp Order     Status: None   Collection Time: 03/17/19  4:04 PM   Specimen: Nasopharyngeal Swab  Result Value Ref Range   SARS Coronavirus 2 NEGATIVE NEGATIVE    Comment: (NOTE) If result is NEGATIVE SARS-CoV-2 target nucleic acids are NOT DETECTED. The SARS-CoV-2 RNA is generally detectable in upper and lower  respiratory specimens during the acute phase of infection. The lowest  concentration of SARS-CoV-2 viral copies this assay can detect is 250  copies / mL. A negative result does not preclude SARS-CoV-2 infection  and should not be used as the sole basis for treatment or other  patient management decisions.  A negative result may occur with  improper specimen collection / handling, submission of specimen other  than nasopharyngeal swab, presence of viral mutation(s) within the  areas targeted by this assay, and inadequate number of viral copies  (<250 copies / mL). A negative result must be combined with clinical  observations, patient history, and epidemiological information. If result is POSITIVE SARS-CoV-2 target nucleic acids are DETECTED. The SARS-CoV-2 RNA is generally detectable in upper and lower  respiratory specimens dur ing the acute phase of infection.  Positive  results are indicative of active infection with SARS-CoV-2.  Clinical  correlation with patient history and other diagnostic information  is  necessary to determine patient infection status.  Positive results do  not rule out bacterial infection or co-infection with other viruses. If result is PRESUMPTIVE POSTIVE SARS-CoV-2 nucleic acids MAY BE PRESENT.   A presumptive positive result was obtained on the submitted specimen  and confirmed on repeat testing.  While 2019 novel coronavirus  (SARS-CoV-2) nucleic acids may be present in the submitted sample  additional confirmatory testing may be necessary for epidemiological  and / or clinical management purposes  to differentiate between  SARS-CoV-2 and other Sarbecovirus currently known to infect humans.  If clinically indicated  additional testing with an alternate test  methodology [phone]) is advised. The SARS-CoV-2 RNA is generally  detectable in upper and lower respiratory sp ecimens during the acute  phase of infection. The expected result is Negative. Fact Sheet for Patients:  StrictlyIdeas.no Fact Sheet for Healthcare Providers: BankingDealers.co.za This test is not yet approved or cleared by the [place] FDA and has been authorized for detection and/or diagnosis of SARS-CoV-2 by FDA under an Emergency Use Authorization (EUA).  This EUA will remain in effect (meaning this test can be used) for the duration of the COVID-19 declaration under Section 564(b)(1) of the Act, 21 U.S.C. section 360bbb-3(b)(1), unless the authorization is terminated or revoked sooner. Performed at [facility], [address] [place], [place] [address]   POC CBG, ED     Status: Abnormal   Collection Time: 03/17/19  5:57 PM  Result Value Ref Range   Glucose-Capillary 232 (H) 70 - 99 mg/dL  CBC with Differential     Status: Abnormal   Collection Time: 03/19/19  4:22 PM  Result Value Ref Range   WBC 8.1 4.0 - 10.5 K/uL   RBC 4.11 (L) 4.22 - 5.81 MIL/uL   Hemoglobin 13.0 13.0 - 17.0 g/dL   HCT 39.7 39.0 - 52.0 %   MCV 96.6 80.0 - 100.0  fL   MCH 31.6 26.0 - 34.0 pg   MCHC 32.7 30.0 - 36.0 g/dL   RDW 12.9 11.5 - 15.5 %   Platelets 333 150 - 400 K/uL   nRBC 0.0 0.0 - 0.2 %   Neutrophils Relative % 60 %   Neutro Abs 4.9 1.7 - 7.7 K/uL   Lymphocytes Relative 28 %   Lymphs Abs 2.2 0.7 - 4.0 K/uL   Monocytes Relative 10 %   Monocytes Absolute 0.8 0.1 - 1.0 K/uL   Eosinophils Relative 1 %   Eosinophils Absolute 0.1 0.0 - 0.5 K/uL   Basophils Relative 1 %   Basophils Absolute 0.1 0.0 - 0.1 K/uL   Immature Granulocytes 0 %   Abs Immature Granulocytes 0.02 0.00 - 0.07 K/uL    Comment: Performed at [facility], [address], [place], [place] [address]  Comprehensive metabolic panel     Status: Abnormal   Collection Time: 03/19/19  4:22 PM  Result Value Ref Range   Sodium 134 (L) 135 - 145 mmol/L   Potassium 4.3 3.5 - 5.1 mmol/L   Chloride 101 98 - 111 mmol/L   CO2 23 22 - 32 mmol/L   Glucose, Bld 322 (H) 70 - 99 mg/dL   BUN 16 6 - 20 mg/dL   Creatinine, Ser 1.54 (H) 0.61 - 1.24 mg/dL   Calcium 9.0 8.9 - 10.3 mg/dL   Total Protein 6.4 (L) 6.5 - 8.1 g/dL   Albumin 3.7 3.5 - 5.0 g/dL   AST 20 15 - 41 U/L   ALT 23 0 - 44 U/L   Alkaline Phosphatase 36 (L) 38 - 126 U/L   Total Bilirubin 0.4 0.3 - 1.2 mg/dL   GFR calc non Af Amer 51 (L) >60 mL/min   GFR calc Af Amer 59 (L) >60 mL/min   Anion gap 10 5 - 15    Comment: Performed at [facility], [address] [place], [place] [address]  CK     Status: None   Collection Time: 03/19/19  4:22 PM  Result Value Ref Range   Total CK 245 49 - 397 U/L    Comment: Performed at [facility], [address], [place], [place] [address]  Novel Coronavirus,NAA,(SEND-OUT TO REF LAB - TAT 24-48 hrs); Hosp Order     Status: None   Collection Time: 03/19/19  7:11 PM   Specimen: Nasopharyngeal Swab; Respiratory  Result Value Ref Range   SARS-CoV-2, NAA NOT DETECTED NOT DETECTED    Comment: (NOTE) Testing was performed using the cobas(R) SARS-CoV-2 test. This test was developed and  its performance characteristics determined by [employer]. This test has not been FDA cleared or approved. This test has been authorized by FDA under an Emergency Use Authorization (EUA). This test is only authorized for the duration of time the declaration that circumstances exist justifying the authorization of the emergency use of in vitro diagnostic tests for detection of SARS-CoV-2 virus and/or diagnosis of COVID-19 infection under section 564(b)(1) of the Act, 21 U.S.C. 093OIZ-1(I)(4), unless the authorization is terminated or revoked sooner. When diagnostic testing is negative, the possibility of a false negative result should be considered in the context of a patient's recent exposures and the presence of clinical signs and symptoms consistent with COVID-19. An individual without symptoms of COVID-19 and who is not shedding SARS-CoV-2 virus would expect to have  a negative (not detected) result in this assay. Performed At: [facility] [address] [place], [place] [id number] Rush Farmer MD [id number]    Coronavirus Source NASOPHARYNGEAL     Comment: Performed at [facility], [address], [place], [place] [address]  Glucose, capillary     Status: Abnormal   Collection Time: 03/19/19 11:14 PM  Result Value Ref Range   Glucose-Capillary 319 (H) 70 - 99 mg/dL  Troponin I - Now Then Q6H     Status: None   Collection Time: 03/19/19 11:25 PM  Result Value Ref Range   Troponin I <0.03 <0.03 ng/mL    Comment: Performed at [facility], [address] [place], [place] [address]  Magnesium     Status: None   Collection Time: 03/19/19 11:25 PM  Result Value Ref Range   Magnesium 1.8 1.7 - 2.4 mg/dL    Comment: Performed at [facility], [address] [place], [place] [address]  Protein C, total     Status: None   Collection Time: 03/19/19 11:25 PM  Result Value Ref Range   Protein C, Total 142 60 - 150 %    Comment: (NOTE) Performed At: [facility] [place], [place] [id number] Rush Farmer MD [id number]   Protein C activity     Status: Abnormal   Collection Time: 03/19/19 11:25 PM  Result Value Ref Range   Protein C Activity 184 (H) 73 - 180 %    Comment: (NOTE) Elevated protein C activity is of no known clinical significance. Performed At: [place] [facility] [place], [place] [id number] Rush Farmer MD [id number]   Protein S, total     Status: None   Collection Time: 03/19/19 11:25 PM  Result Value Ref Range   Protein S Ag, Total 87 60 - 150 %    Comment: (NOTE) This test was developed and its performance characteristics determined by [facility]. It has not been cleared or approved by the Food and Drug Administration. Performed At: [facility] [place], [place] [id number] Rush Farmer MD FO:[id number]   Factor 5 leiden     Status: None   Collection Time: 03/19/19 11:25 PM  Result Value Ref Range   Recommendations-F5LEID: Comment     Comment: (NOTE)  Result:  Negative (no mutation found) Factor V Leiden is a specific mutation (R506Q) in the factor V gene that is associated with an increased risk of venous thrombosis. Factor V Leiden is more resistant to inactivation by activated protein C.  As a result, factor V persists in the circulation leading to a mild hyper- coagulable state.  The Leiden mutation accounts for 90% - 95% of APC resistance.  Factor V Leiden has been reported in patients with deep vein thrombosis, pulmonary embolus, central retinal vein occlusion, cerebral sinus thrombosis and hepatic vein thrombosis. Other risk factors to be considered in the workup for venous thrombosis include the G20210A mutation in the factor II (prothrombin) gene, protein S and C deficiency, and antithrombin deficiencies. Anticardiolipin antibody and lupus anticoagulant analysis may be appropriate for certain patients, as well as homocysteine  levels. Contact your local [facility] for information on how to order additi onal testing if desired. **Genetic counselors are available for health care providers to**  discuss results at [phone]-GENE [phone]). Methodology: DNA analysis of the Factor V gene was performed by allele-specific PCR. The diagnostic sensitivity and specificity is >99% for both. Molecular-based testing is highly accurate, but as in any laboratory test, diagnostic errors may occur. All test results must be combined with clinical information for the most accurate interpretation. This test was developed and its performance characteristics determined by [facility]. It has not been cleared or approved by the Food and Drug Administration. References: Voelkerding K (1996).  Clin Lab Med [phone]. Allison Quarry, PhD, [facility] Ruben Reason, PhD, [facility] Annetta Maw, M.S., PhD, [facility] Alfredo Bach, PhD, [facility] Norva Riffle, PhD, [facility] Earlean Polka PhD, [facility] Performed At: [facility] [address] [place], [place] [id number] Katina Degree MDPhD IR:[id number]   Antiphospholipid syndrome eval, bld     Status: None   Collection Time: 03/19/19 11:25 PM  Result Value Ref Range   Anticardiolipin IgA <9 0 - 11 APL U/mL    Comment: (NOTE)                          Negative:              <12                          Indeterminate:     12 - 20                          Low-Med Positive: >20 - 80                          High Positive:         >80 Performed At: [facility] [place], [place] [id number] Rush Farmer MD AT:[id number]    Anticardiolipin IgG <9 0 - 14 GPL U/mL    Comment: (NOTE)                          Negative:              <15                          Indeterminate:     15 - 20  Low-Med Positive: >20 - 80                          High Positive:         >80    Anticardiolipin IgM <9 0 - 12 MPL U/mL    Comment: (NOTE)                           Negative:              <13                          Indeterminate:     13 - 20                          Low-Med Positive: >20 - 80                          High Positive:         >80    PTT Lupus Anticoagulant 29.6 0.0 - 51.9 sec   DRVVT 33.6 0.0 - 47.0 sec   Phosphatydalserine, IgG 3 0 - 11 GPS IgG   Phosphatydalserine, IgM 4 0 - 25 MPS IgM   Phosphatydalserine, IgA 1 0 - 20 APS IgA   Lupus Anticoag Interp Comment:     Comment: No lupus anticoagulant was detected.  Homocysteine     Status: Abnormal   Collection Time: 03/19/19 11:25 PM  Result Value Ref Range   Homocysteine 27.6 (H) 0.0 - 14.5 umol/L    Comment: (NOTE)              **Please note reference interval change** Performed At: [facility] [place], [place] [id number] Rush Farmer MD YB:[id number]   APTT     Status: None   Collection Time: 03/19/19 11:28 PM  Result Value Ref Range   aPTT 26 24 - 36 seconds    Comment: Performed at [facility], [address] [place], [place] [address]  Protime-INR     Status: None   Collection Time: 03/19/19 11:28 PM  Result Value Ref Range   Prothrombin Time 12.0 11.4 - 15.2 seconds   INR 0.9 0.8 - 1.2    Comment: (NOTE) INR goal varies based on device and disease states. Performed at [facility], [address] [place], [place] [address]   Glucose, capillary     Status: Abnormal   Collection Time: 03/20/19  4:04 AM  Result Value Ref Range   Glucose-Capillary 157 (H) 70 - 99 mg/dL  CBC     Status: Abnormal   Collection Time: 03/20/19  5:40 AM  Result Value Ref Range   WBC 6.9 4.0 - 10.5 K/uL   RBC 3.94 (L) 4.22 - 5.81 MIL/uL   Hemoglobin 12.6 (L) 13.0 - 17.0 g/dL   HCT 38.2 (L) 39.0 - 52.0 %   MCV 97.0 80.0 - 100.0 fL   MCH 32.0 26.0 - 34.0 pg   MCHC 33.0 30.0 - 36.0 g/dL   RDW 13.0 11.5 - 15.5 %   Platelets 322 150 - 400 K/uL   nRBC 0.0 0.0 - 0.2 %    Comment: Performed at [facility], [address] [place], [place] [address]  Troponin I -  Now Then Q6H     Status: None  Collection Time: 03/20/19  5:40 AM  Result Value Ref Range   Troponin I <0.03 <0.03 ng/mL    Comment: Performed at [facility], [address] [place], [place] [address]  Prothrombin gene mutation     Status: None   Collection Time: 03/20/19  5:40 AM  Result Value Ref Range   Recommendations-PTGENE: Comment     Comment: (NOTE) NEGATIVE No mutation identified. Comment: A point mutation (G20210A) in the factor II (prothrombin) gene is the second most common cause of inherited thrombophilia. The incidence of this mutation in the [place] Caucasian population is about 2% and in the [place] American population it is approximately 0.5%. This mutation is rare in the [place] and Native American population. Being heterozygous for a prothrombin mutation increases the risk for developing venous thrombosis about 2 to 3 times above the general population risk. Being homozygous for the prothrombin gene mutation increases the relative risk for venous thrombosis further, although it is not yet known how much further the risk is increased. In women heterozygous for the prothrombin gene mutation, the use of estrogen containing oral contraceptives increases the relative risk of venous thrombosis about 16 times and the risk of developing cerebral thrombosis is also significantly increased. In pregnancy the pr othrombin gene mutation increases risk for venous thrombosis and may increase risk for stillbirth, placental abruption, pre-eclampsia and fetal growth restriction. If the patient possesses two or more congenital or acquired thrombophilic risk factors, the risk for thrombosis may rise to more than the sum of the risk ratios for the individual mutations. This assay detects only the prothrombin G20210A mutation and does not measure genetic abnormalities elsewhere in the genome. Other thrombotic risk factors may be pursued through systematic clinical laboratory analysis.  These factors include the R506Q (Leiden) mutation in the Factor V gene, plasma homocysteine levels, as well as testing for deficiencies of antithrombin III, protein C and protein S. Genetic Counselors are available for health care providers to discuss results at [phone] [phone]). Methodology: DNA analysis of the Factor II gene was performed by PCR amplification followed by restriction analysis. The di agnostic sensitivity is >99% for both. All the tests must be combined with clinical information for the most accurate interpretation. Molecular-based testing is highly accurate, but as in any laboratory test, diagnostic errors may occur. This test was developed and its performance characteristics determined by [facility]. It has not been cleared or approved by the Food and Drug Administration. Poort SR, et al. Blood. 1996; 96:7893-8101. Varga EA. Circulation. 2004; 751:W25-E52. Mervin Hack, et [place]; 19:700-703. Allison Quarry, PhD, [facility] Ruben Reason, PhD, [facility] Annetta Maw, M.S., PhD, [facility] Alfredo Bach, PhD, [facility] Norva Riffle, PhD, [facility] Earlean Polka, PhD, [facility] Performed At: [facility] [address] [place], [place] [id number] Katina Degree MDPhD IR:[id number]   Antithrombin III     Status: None   Collection Time: 03/20/19  5:40 AM  Result Value Ref Range   AntiThromb III Func 100 75 - 120 %    Comment: Performed at [facility], [place] [address] [place], [place] [address]  Heparin level (unfractionated)     Status: Abnormal   Collection Time: 03/20/19  6:39 AM  Result Value Ref Range   Heparin Unfractionated <0.10 (L) 0.30 - 0.70 IU/mL    Comment: (NOTE) If heparin results are below expected values, and patient dosage has  been confirmed, suggest follow up testing of antithrombin III levels. Performed at [facility]  [address] [place], [place] [address]   Glucose, capillary     Status: Abnormal    Collection Time: 03/20/19  7:46 AM  Result Value Ref Range   Glucose-Capillary 170 (H) 70 - 99 mg/dL  Glucose, capillary     Status: Abnormal   Collection Time: 03/20/19 10:58 AM  Result Value Ref Range   Glucose-Capillary 240 (H) 70 - 99 mg/dL  Glucose, capillary     Status: Abnormal   Collection Time: 03/20/19  4:08 PM  Result Value Ref Range   Glucose-Capillary 193 (H) 70 - 99 mg/dL  Heparin level (unfractionated)     Status: Abnormal   Collection Time: 03/20/19  4:47 PM  Result Value Ref Range   Heparin Unfractionated <0.10 (L) 0.30 - 0.70 IU/mL    Comment: (NOTE) If heparin results are below expected values, and patient dosage has  been confirmed, suggest follow up testing of antithrombin III levels. Performed at [facility], [address] [place], [place] [address]   Glucose, capillary     Status: Abnormal   Collection Time: 03/20/19  8:08 PM  Result Value Ref Range   Glucose-Capillary 263 (H) 70 - 99 mg/dL  Glucose, capillary     Status: Abnormal   Collection Time: 03/20/19 11:47 PM  Result Value Ref Range   Glucose-Capillary 131 (H) 70 - 99 mg/dL  Surgical pcr screen     Status: None   Collection Time: 03/21/19  3:04 AM   Specimen: Nasal Mucosa; Nasal Swab  Result Value Ref Range   MRSA, PCR NEGATIVE NEGATIVE   Staphylococcus aureus NEGATIVE NEGATIVE    Comment: (NOTE) The Xpert SA Assay (FDA approved for NASAL specimens in patients 94 years of age and older), is one component of a comprehensive surveillance program. It is not intended to diagnose infection nor to guide or monitor treatment. Performed at [facility], [address], [place], [place] [address]   Glucose, capillary     Status: Abnormal   Collection Time: 03/21/19  4:13 AM  Result Value Ref Range   Glucose-Capillary 133 (H) 70 - 99 mg/dL  CBC     Status: Abnormal   Collection Time: 03/21/19  6:06 AM  Result Value Ref Range   WBC 7.6 4.0 - 10.5 K/uL   RBC 4.01 (L) 4.22 - 5.81 MIL/uL    Hemoglobin 12.7 (L) 13.0 - 17.0 g/dL   HCT 38.8 (L) 39.0 - 52.0 %   MCV 96.8 80.0 - 100.0 fL   MCH 31.7 26.0 - 34.0 pg   MCHC 32.7 30.0 - 36.0 g/dL   RDW 13.1 11.5 - 15.5 %   Platelets 335 150 - 400 K/uL   nRBC 0.0 0.0 - 0.2 %    Comment: Performed at [facility], [address], [place], [place] [address]  Heparin level (unfractionated)     Status: Abnormal   Collection Time: 03/21/19  6:06 AM  Result Value Ref Range   Heparin Unfractionated 0.22 (L) 0.30 - 0.70 IU/mL    Comment: (NOTE) If heparin results are below expected values, and patient dosage has  been confirmed, suggest follow up testing of antithrombin III levels. Performed at [facility], [address] [place], [place] [address]   Basic metabolic panel     Status: Abnormal   Collection Time: 03/21/19  6:06 AM  Result Value Ref Range   Sodium 141 135 - 145 mmol/L    Comment: DELTA CHECK NOTED   Potassium 4.4 3.5 - 5.1 mmol/L  Chloride 107 98 - 111 mmol/L   CO2 28 22 - 32 mmol/L   Glucose, Bld 89 70 - 99 mg/dL   BUN 20 6 - 20 mg/dL   Creatinine, Ser 1.62 (H) 0.61 - 1.24 mg/dL   Calcium 9.3 8.9 - 10.3 mg/dL   GFR calc non Af Amer 48 (L) >60 mL/min   GFR calc Af Amer 55 (L) >60 mL/min   Anion gap 6 5 - 15    Comment: Performed at [facility], [address] [place], [place] [address]  Glucose, capillary     Status: None   Collection Time: 03/21/19  7:58 AM  Result Value Ref Range   Glucose-Capillary 86 70 - 99 mg/dL  Glucose, capillary     Status: Abnormal   Collection Time: 03/21/19 11:48 AM  Result Value Ref Range   Glucose-Capillary 149 (H) 70 - 99 mg/dL  ECHOCARDIOGRAM COMPLETE BUBBLE STUDY     Status: None   Collection Time: 03/21/19  1:03 PM  Result Value Ref Range   Weight 3,120 oz   Height 74 in   BP 105/62 mmHg  CBC with Differential     Status: Abnormal   Collection Time: 04/02/19  4:12 PM  Result Value Ref Range   WBC 7.0 4.0 - 10.5 K/uL   RBC 4.47 4.22 - 5.81 MIL/uL   Hemoglobin 14.2 13.0 -  17.0 g/dL   HCT 43.0 39.0 - 52.0 %   MCV 96.2 80.0 - 100.0 fL   MCH 31.8 26.0 - 34.0 pg   MCHC 33.0 30.0 - 36.0 g/dL   RDW 13.1 11.5 - 15.5 %   Platelets 420 (H) 150 - 400 K/uL   nRBC 0.0 0.0 - 0.2 %   Neutrophils Relative % 63 %   Neutro Abs 4.4 1.7 - 7.7 K/uL   Lymphocytes Relative 25 %   Lymphs Abs 1.8 0.7 - 4.0 K/uL   Monocytes Relative 10 %   Monocytes Absolute 0.7 0.1 - 1.0 K/uL   Eosinophils Relative 1 %   Eosinophils Absolute 0.1 0.0 - 0.5 K/uL   Basophils Relative 1 %   Basophils Absolute 0.1 0.0 - 0.1 K/uL   Immature Granulocytes 0 %   Abs Immature Granulocytes 0.01 0.00 - 0.07 K/uL    Comment: Performed at [facility], [address] [place], [place] [address]  Comprehensive metabolic panel     Status: Abnormal   Collection Time: 04/02/19  4:12 PM  Result Value Ref Range   Sodium 134 (L) 135 - 145 mmol/L   Potassium 4.7 3.5 - 5.1 mmol/L   Chloride 96 (L) 98 - 111 mmol/L   CO2 27 22 - 32 mmol/L   Glucose, Bld 180 (H) 70 - 99 mg/dL   BUN 17 6 - 20 mg/dL   Creatinine, Ser 1.23 0.61 - 1.24 mg/dL   Calcium 9.3 8.9 - 10.3 mg/dL   Total Protein 7.1 6.5 - 8.1 g/dL   Albumin 3.6 3.5 - 5.0 g/dL   AST 15 15 - 41 U/L   ALT 22 0 - 44 U/L   Alkaline Phosphatase 55 38 - 126 U/L   Total Bilirubin 0.7 0.3 - 1.2 mg/dL   GFR calc non Af Amer >60 >60 mL/min   GFR calc Af Amer >60 >60 mL/min   Anion gap 11 5 - 15    Comment: Performed at [facility], [address], [place], [place] [address]  POC CBG, ED     Status: Abnormal  Collection Time: 04/02/19  4:24 PM  Result Value Ref Range   Glucose-Capillary 166 (H) 70 - 99 mg/dL  I-Stat Creatinine, ED (not at [facility])     Status: Abnormal   Collection Time: 04/02/19  4:28 PM  Result Value Ref Range   Creatinine, Ser 1.30 (H) 0.61 - 1.24 mg/dL    RADIOGRAPHIC STUDIES: I have personally reviewed the radiological images as listed and agreed with the findings in the report. Ct Soft Tissue Neck W Contrast  Result Date:  04/02/2019 CLINICAL DATA:  Dental caries. Left lower toothache, ear pain, and jaw swelling. EXAM: CT NECK WITH CONTRAST TECHNIQUE: Multidetector CT imaging of the neck was performed using the standard protocol following the bolus administration of intravenous contrast. CONTRAST:  78mL OMNIPAQUE IOHEXOL 300 MG/ML  SOLN COMPARISON:  None. FINDINGS: Pharynx and larynx: Symmetric pharyngeal soft tissues without evidence of mass or swelling. Patent airway. No retropharyngeal fluid. Unremarkable larynx. Salivary glands: No inflammation, mass, or stone. Thyroid: Unremarkable. Lymph nodes: Slight asymmetric prominence of subcentimeter short axis lymph nodes in the left neck compared to the right, likely reactive. Vascular: Mild atherosclerosis at the carotid bifurcations. Limited intracranial: Unremarkable. Visualized orbits: Unremarkable. Mastoids and visualized paranasal sinuses: Clear. Skeleton: Poor dentition with multiple dental caries and periapical lucencies. There is periapical lucency associated with the left lateral maxillary incisor with disruption of the buccal cortex and an overlying 14 x 6 x 12 mm fluid collection with surrounding soft tissue thickening/inflammation. Upper chest: Clear lung apices. Other: None. IMPRESSION: Poor dentition. 14 mm subperiosteal abscess associated with the left lateral maxillary incisor. Electronically Signed   By: Logan Bores M.D.   On: 04/02/2019 17:42   Dg Chest Portable 1 View  Result Date: 03/17/2019 CLINICAL DATA:  Chest pain.Patient was working outside. Became lightheaded and feeling hot. Chest and neck pain EXAM: PORTABLE CHEST 1 VIEW COMPARISON:  03/17/2019 FINDINGS: Normal heart size. No pleural effusion or edema. No airspace opacities. The lungs are hyperinflated with coarsened interstitial markings of emphysema. Visualized osseous structures are unremarkable. IMPRESSION: 1. No acute cardiopulmonary abnormalities. Electronically Signed   By: Kerby Moors M.D.    On: 03/17/2019 14:53   Ct Angio Chest/abd/pel For Dissection W And/or W/wo  Result Date: 03/17/2019 CLINICAL DATA:  Chest pain and abdominal pain EXAM: CT ANGIOGRAPHY CHEST, ABDOMEN AND PELVIS TECHNIQUE: Initially, axial CT images were obtained through the chest without intravenous contrast material administration. Multidetector CT imaging through the chest, abdomen and pelvis was performed using the standard protocol during bolus administration of intravenous contrast. Multiplanar reconstructed images and MIPs were obtained and reviewed to evaluate the vascular anatomy. CONTRAST:  5mL OMNIPAQUE IOHEXOL 350 MG/ML SOLN COMPARISON:  CT abdomen and pelvis August 06, 2018. FINDINGS: CTA CHEST FINDINGS Cardiovascular: There is no intramural hematoma in the aorta on noncontrast enhanced study. There is no demonstrable thoracic aortic aneurysm or dissection. Visualized great vessels appear normal except for slight calcification at the origin of the right subclavian artery. There is no demonstrable pulmonary embolus. There is a minimal pericardial effusion which may be within the physiologic range. There is no pericardial thickening. There are foci of coronary artery calcification. There is slight aortic atherosclerosis near the aortic valve. Mediastinum/Nodes: Thyroid appears unremarkable. There is no demonstrable thoracic adenopathy. No esophageal lesions are evident. Lungs/Pleura: There is slight bibasilar atelectatic change. Lungs elsewhere are clear. No pleural effusion or pleural thickening evident. Musculoskeletal: No blastic or lytic bone lesions. No evident fracture or dislocation. No chest wall lesions are  evident. Review of the MIP images confirms the above findings. CTA ABDOMEN AND PELVIS FINDINGS VASCULAR Aorta: There is no abdominal aortic aneurysm or dissection. There are foci of aortic atherosclerotic calcification inferior to the renal arteries with mild plaque in this area. There is no hemodynamically  significant obstructive disease in the aorta. Celiac: There is mild atherosclerotic plaque at the origin of the celiac artery without hemodynamically significant obstruction. No other atherosclerotic plaque is noted in the celiac artery branches. In particular, this splenic artery appears unremarkable. No aneurysm or dissection involving the celiac artery or its branches. SMA: The superior mesenteric artery and its branches appear widely patent. No aneurysm or dissection evident. Renals: There is a single renal artery on the left. There is a single renal artery arising from the aorta which almost immediately branches into 2 adjacent renal arteries. There is no appreciable vessel narrowing or obstructive disease in either renal artery or branches. No fibromuscular dysplasia evident. No aneurysm or dissection. IMA: So inferior mesenteric artery and its branches appear patent. No aneurysm or dissection involving these branches. Inflow: There is moderate calcification in each common iliac artery, slightly more on the left than on the right. No hemodynamically significant obstruction is evident in either common iliac artery. There are areas of patchy atherosclerotic plaque in the left external iliac artery without hemodynamically significant obstruction. There is moderate atherosclerotic plaque in each internal iliac artery without hemodynamically significant obstruction. There is mild plaque in each common femoral artery without hemodynamically significant obstruction. The proximal superficial femoral and profunda femoral arteries appear widely patent. No aneurysm or dissection involving pelvic arterial vascular structures. Veins: No obvious venous abnormality within the limitations of this arterial phase study. Review of the MIP images confirms the above findings. NON-VASCULAR Hepatobiliary: There is hepatic steatosis. No focal liver lesions are evident. Gallbladder wall is not appreciably thickened. There is no  biliary duct dilatation. Pancreas: No pancreatic mass or inflammatory focus. Spleen: There are peripheral splenic infarcts throughout the spleen. Spleen otherwise appears unremarkable. Spleen appears normal in size and contour. Adrenals/Urinary Tract: Adrenals bilaterally appear unremarkable. Kidneys bilaterally show no evident mass or hydronephrosis on either side. There is no evident renal or ureteral calculus on either side. Urinary bladder is midline with wall thickness within normal limits. Stomach/Bowel: There is no appreciable bowel wall or mesenteric thickening. There is no evident bowel obstruction. There is no free air or portal venous air. Terminal ileum appears normal. Lymphatic: There is no demonstrable adenopathy in the abdomen or pelvis. Reproductive: Prostate and seminal vesicles appear normal in size and contour. No pelvic mass evident. Other: Appendix appears normal. There is no abscess or ascites in the abdomen or pelvis. Musculoskeletal: There is degenerative change in the lumbar spine. There are no blastic or lytic bone lesions. There is no intramuscular or abdominal wall lesion. Review of the MIP images confirms the above findings. IMPRESSION: CT angiogram chest: 1. No thoracic aortic aneurysm or dissection. There is slight aortic atherosclerotic calcification in the ascending aorta near the aortic valve. There is slight atherosclerotic calcification at the origin of the right subclavian artery. There are foci of coronary artery calcification. 2.  No evident pulmonary embolus. 3.  Slight bibasilar atelectasis.  Lungs elsewhere clear. 4.  No evident thoracic adenopathy. CT angiogram abdomen; CT angiogram pelvis: 1. Multiple splenic infarcts. Etiology for these infarcts is uncertain. Splenic artery appears patent without appreciable obstructive disease. There is modest atherosclerotic irregularity at the origin of the celiac artery. There is only  minimal aortic atherosclerosis proximal to the  level of the splenic artery. Consider cardiac etiology for these infarcts. Note that no apparent cardiac lesion to account for these findings is seen by CT. 2. There is distal abdominal aortic atherosclerosis without hemodynamically significant obstruction. There are multiple foci of pelvic arterial vascular calcification without hemodynamically significant obstructions. No mesenteric atherosclerosis beyond slight atherosclerosis at the origin of the celiac artery. No aneurysm or dissection involving aorta, major mesenteric, and major pelvic arterial vessels. No fibromuscular dysplasia. 3.      Hepatic steatosis. 4. No bowel obstruction or evidence of bowel ischemia. No abscess in the abdomen or pelvis. Appendix region appears normal. 5. No evident renal or ureteral calculus. No hydronephrosis. Urinary bladder wall thickness is within normal limits. Electronically Signed   By: Lowella Grip III M.D.   On: 03/17/2019 14:55    ASSESSMENT & PLAN:  Splenic infarct 1.  Multiple splenic infarcts: - Patient came to the ER with abdominal pain. - CT angiogram CAP for dissection on 03/17/2019 shows multiple splenic infarcts.  Etiology is uncertain.  Splenic artery was patent without obstructive disease.  There is only minimal aortic atherosclerosis proximal to the level of splenic artery.  Hepatic steatosis.  Spleen was normal in size and contour. - Patient started on anticoagulation, currently on Eliquis.  Never had a history of prior thrombosis.  No family history of thromboembolism. - Hypercoagulable work-up including protein C, protein S, Antithrombin III, factor V Leiden and prothrombin gene mutation were negative. - Homocystine was moderately elevated at 27.6. - Patient having toothache for the past few weeks.  A CT of the soft tissues of the neck on 04/02/2019 shows 14 mm subperiosteal abscess associated with left lateral maxillary incisor.  As a result he apparently lost about 5 to 10 pounds as he could  not eat.  Otherwise he denies any fevers or night sweats. -No bleeding issues while on anticoagulation with Eliquis.  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 -He is scheduled for a TEE as an outpatient by cardiology. 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  At this time I would recommend long-term anticoagulation. -I would check Jak 2 V617F and a factor VIII antigen levels to complete the work-up.  Anticardiolipin antibodies and lupus anticoagulant were also negative.  2.  Malignant melanoma: - Patient reportedly had melanoma in the left upper quadrant resected along with left axillary lymph node dissection, done at [facility] in the early 2000's. -We will request records of it. -Recent CT CAP did not reveal any evidence of metastatic disease.  3.  Smoking history: - He smoked 1 and half Susi per day for 45 years.  Since the recent hospitalization, he cut it back to half Lundy per day. -Recent CT of the chest did not report any lung nodules.   All questions were answered. The patient knows to call the clinic with any problems, questions or concerns.    Derek Jack, MD 04/08/19 2:16 PM

## 2019-04-09 LAB — FACTOR VIII (NCBH)

## 2019-04-09 NOTE — Telephone Encounter (Signed)
Spoke with pt regarding dentist appt Pt given number to contact for dental appt

## 2019-04-10 ENCOUNTER — Ambulatory Visit: Payer: Medicare Other | Admitting: Student

## 2019-04-14 ENCOUNTER — Telehealth: Payer: Medicare Other

## 2019-04-14 LAB — JAK2 GENOTYPR

## 2019-04-15 ENCOUNTER — Other Ambulatory Visit: Payer: Self-pay | Admitting: Family Medicine

## 2019-04-15 DIAGNOSIS — E785 Hyperlipidemia, unspecified: Secondary | ICD-10-CM

## 2019-04-15 NOTE — Telephone Encounter (Signed)
Refill request for Trazodone last seen 03/28/19 Last rx'd 01/14/19 #90 no refills

## 2019-04-18 ENCOUNTER — Telehealth: Payer: Self-pay

## 2019-04-22 ENCOUNTER — Encounter (HOSPITAL_COMMUNITY): Payer: Self-pay | Admitting: Hematology

## 2019-04-22 ENCOUNTER — Other Ambulatory Visit: Payer: Self-pay

## 2019-04-22 ENCOUNTER — Inpatient Hospital Stay (HOSPITAL_COMMUNITY): Payer: Medicare Other | Attending: Hematology | Admitting: Hematology

## 2019-04-22 DIAGNOSIS — I129 Hypertensive chronic kidney disease with stage 1 through stage 4 chronic kidney disease, or unspecified chronic kidney disease: Secondary | ICD-10-CM

## 2019-04-22 DIAGNOSIS — N183 Chronic kidney disease, stage 3 (moderate): Secondary | ICD-10-CM

## 2019-04-22 DIAGNOSIS — I2510 Atherosclerotic heart disease of native coronary artery without angina pectoris: Secondary | ICD-10-CM | POA: Insufficient documentation

## 2019-04-22 DIAGNOSIS — Z7901 Long term (current) use of anticoagulants: Secondary | ICD-10-CM | POA: Insufficient documentation

## 2019-04-22 DIAGNOSIS — E7211 Homocystinuria: Secondary | ICD-10-CM | POA: Insufficient documentation

## 2019-04-22 DIAGNOSIS — D735 Infarction of spleen: Secondary | ICD-10-CM

## 2019-04-22 DIAGNOSIS — Z79899 Other long term (current) drug therapy: Secondary | ICD-10-CM | POA: Diagnosis not present

## 2019-04-22 DIAGNOSIS — E785 Hyperlipidemia, unspecified: Secondary | ICD-10-CM | POA: Diagnosis not present

## 2019-04-22 DIAGNOSIS — K760 Fatty (change of) liver, not elsewhere classified: Secondary | ICD-10-CM | POA: Diagnosis not present

## 2019-04-22 DIAGNOSIS — R200 Anesthesia of skin: Secondary | ICD-10-CM | POA: Diagnosis not present

## 2019-04-22 DIAGNOSIS — F17210 Nicotine dependence, cigarettes, uncomplicated: Secondary | ICD-10-CM | POA: Insufficient documentation

## 2019-04-22 DIAGNOSIS — K219 Gastro-esophageal reflux disease without esophagitis: Secondary | ICD-10-CM | POA: Diagnosis not present

## 2019-04-22 DIAGNOSIS — I700 Atherosclerosis of aorta: Secondary | ICD-10-CM | POA: Diagnosis not present

## 2019-04-22 DIAGNOSIS — J449 Chronic obstructive pulmonary disease, unspecified: Secondary | ICD-10-CM | POA: Diagnosis not present

## 2019-04-22 DIAGNOSIS — K0889 Other specified disorders of teeth and supporting structures: Secondary | ICD-10-CM | POA: Diagnosis not present

## 2019-04-22 DIAGNOSIS — E669 Obesity, unspecified: Secondary | ICD-10-CM | POA: Diagnosis not present

## 2019-04-22 DIAGNOSIS — Z85820 Personal history of malignant melanoma of skin: Secondary | ICD-10-CM | POA: Insufficient documentation

## 2019-04-22 DIAGNOSIS — F329 Major depressive disorder, single episode, unspecified: Secondary | ICD-10-CM | POA: Insufficient documentation

## 2019-04-22 DIAGNOSIS — Z794 Long term (current) use of insulin: Secondary | ICD-10-CM | POA: Insufficient documentation

## 2019-04-22 DIAGNOSIS — I252 Old myocardial infarction: Secondary | ICD-10-CM | POA: Insufficient documentation

## 2019-04-22 NOTE — Assessment & Plan Note (Signed)
1.  Multiple splenic infarcts: - Patient came to the ER with abdominal pain. - CT angiogram CAP for dissection on 03/17/2019 shows multiple splenic infarcts.  Etiology is uncertain.  Splenic artery was patent without obstructive disease.  There is only minimal aortic atherosclerosis proximal to the level of splenic artery.  Hepatic steatosis.  Spleen was normal in size and contour. - Patient started on anticoagulation, currently on Eliquis.  Never had a history of prior thrombosis.  No family history of thromboembolism. - Hypercoagulable work-up including protein C, protein S, Antithrombin III, factor V Leiden and prothrombin gene mutation were negative. - Homocystine was moderately elevated at 27.6. - Patient having toothache for the past few weeks.  A CT of the soft tissues of the neck on 04/02/2019 shows 14 mm subperiosteal abscess associated with left lateral maxillary incisor.  As a result he apparently lost about 5 to 10 pounds as he could not eat.  Otherwise he denies any fevers or night sweats. -No bleeding issues while on anticoagulation with Eliquis.  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  At this time I have recommended long-term anticoagulation. -I have checked Jak 2 mutation testing, LDH and factor VIII which were within normal limits. - He was told to follow-up with cardiology for a TEE. -I will reevaluate him in 3 to 4 months.  2.  T2 N0 M0 malignant melanoma: - Diagnosed in 2006 with malignant melanoma, 1.3 mm in thickness, Clark level IV.  He underwent wide excision of abdominal wall and sentinel lymph node mapping.  Final pathology is negative for residual tumor other than melanoma in situ at the left upper quadrant abdominal incision site. -He follows up with Dr. Pecolia Ades at [facility].  Recent CT scans did not show any evidence of metastatic  disease.  3.  Smoking history: - He smoked 1 and half Flannery per day for 45 years.  Since the recent hospitalization, he cut it back to half Killmer per day. -Recent CT of the chest did not report any lung nodules.

## 2019-04-22 NOTE — Progress Notes (Signed)
Dustin Cruz, [place] [id number]   CLINIC:  Medical Oncology/Hematology  PCP:  [name], [place] [id number] [phone]   REASON FOR VISIT:  Follow-up for multiple splenic infarcts.  CURRENT THERAPY: Eliquis twice daily.   INTERVAL HISTORY:  Dustin Cruz 53 y.o. male seen for follow-up of multiple splenic infarcts and history of malignant melanoma and smoking history.  He has done hypercoagulable work-up while he was hospitalized.  I have done some blood work at last visit.  He denies any bleeding issues with Eliquis.  Shortness of breath on exertion is present which is stable.  Numbness in the left foot and leg is stable.  Denies any fevers, night sweats or weight loss.    REVIEW OF SYSTEMS:  Review of Systems  Respiratory: Positive for shortness of breath.   Neurological: Positive for numbness.  All other systems reviewed and are negative.    PAST MEDICAL/SURGICAL HISTORY:  Past Medical History:  Diagnosis Date  . Anxiety   . Asthma   . CKD stage 3 due to type 2 diabetes mellitus (Dustin Cruz)   . COPD (chronic obstructive pulmonary disease) (Dustin Cruz)   . Coronary artery disease    s/p BMS to Ramus 10/2010;  Cath 01/22/12 prox 30-40% LAD, LCx ramus w/ hazy 70-80% in-stent restenosis, EF 60% treated medically  . Coronary artery disease    s/p BMS to Ramus 10/2010;  Cath 01/22/12 prox 30-40% LAD, LCx ramus w/ hazy 70-80% in-stent restenosis, EF 60% treated medically   . Depression   . Diabetes mellitus    Type 2  . Gastroparesis 05/2013  . GERD (gastroesophageal reflux disease)   . HTN (hypertension)   . Hyperlipidemia   . Hyperlipidemia   . Major depression, chronic 08/13/2012  . Melanoma ([place]) 2007   surgery at [facility], Followed by [facility]  . Myocardial infarction ([place]) 2012  . Obesity   . Tobacco abuse   . Tubular adenoma   . Urinary retention    Past Surgical History:  Procedure Laterality Date  . BIOPSY   01/07/2018   Procedure: BIOPSY;  Surgeon: Daneil Dolin, MD;  Location[facility];  Service: Endoscopy;;  duodenal and gastric biopsy  . BIOPSY  04/25/2018   Procedure: BIOPSY;  Surgeon: Daneil Dolin, MD;  Location: [facility];  Service: Endoscopy;;  ascending and descending and sigmoid biopsies  . CARDIAC CATHETERIZATION     with stent  . COLONOSCOPY N/A 01/27/2013   MPN:TIRWER and colonic polyps. Tubular adenomas, poor bowel prep, one-year follow-up surveillance colonoscopy recommended  . COLONOSCOPY WITH PROPOFOL N/A 04/25/2018   Procedure: COLONOSCOPY WITH PROPOFOL;  Surgeon: Daneil Dolin, MD;  Location: [facility];  Service: Endoscopy;  Laterality: N/A;  11:00am - pt to be prepped INPT and labs to be done as well  . CORONARY ANGIOPLASTY WITH STENT PLACEMENT    . ESOPHAGOGASTRODUODENOSCOPY  04/24/2012   Dustin Cruz-mild erosive reflux esophagitis,dilated w/22F Venia Minks, small HH, minimal chronic gastric/bulbar erosions(No H pylori)  . ESOPHAGOGASTRODUODENOSCOPY (EGD) WITH PROPOFOL N/A 01/07/2018   Dr. Gala Romney: Erythematous mucosa in the stomach, retained gastric contents, incomplete exam.  Biopsied showed mild chronic gastritis but no H. pylori.  Duodenal biopsy was negative for celiac disease.  Dustin Cruz FLEXIBLE SIGMOIDOSCOPY N/A 01/07/2018   Dr. Gala Romney: Incomplete colonoscopy due to inadequate bowel prep  . LEFT HEART CATHETERIZATION WITH CORONARY ANGIOGRAM N/A 01/22/2012   Procedure: LEFT HEART CATHETERIZATION WITH CORONARY ANGIOGRAM;  Surgeon: Jolaine Artist, MD;  Location: [facility] CATH LAB;  Service: Cardiovascular;  Laterality: N/A;  . melanoma surgery  2007   [place], removed lymph nodes under arm as well,Left abd     SOCIAL HISTORY:  Social History   Socioeconomic History  . Marital status: Divorced    Spouse name: Dustin Cruz  . Number of children: 0  . Years of education: Not on file  . Highest education level: Not on file  Occupational History  . Occupation: disabled  Social Needs   . Financial resource strain: Somewhat hard  . Food insecurity    Worry: Sometimes true    Inability: Never true  . Transportation needs    Medical: No    Non-medical: Not on file  Tobacco Use  . Smoking status: Current Every Day Smoker    Packs/day: 1.00    Years: 31.00    [place] years: 31.00    Types: Cigarettes    Start date: 10/09/1981  . Smokeless tobacco: Never Used  . Tobacco comment: 1/2 Landino daily 11/01/17  Substance and Sexual Activity  . 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  . Drug use: No  . Sexual activity: Yes  Lifestyle  . Physical activity    Days per week: 7 days    Minutes per session: 30 min  . Stress: Rather much  Relationships  . Social connections    Talks on phone: More than three times a week    Gets together: More than three times a week    Attends religious service: Never    Active member of club or organization: No    Attends meetings of clubs or organizations: Never    Relationship status: Divorced  . Intimate partner violence    Fear of current or ex partner: No    Emotionally abused: No    Physically abused: No    Forced sexual activity: No  Other Topics Concern  . Not on file  Social History Narrative   Lives at home in an apartment alone. He is divorced and does not have any children. He has two brothers but is estranged from one. He only talks with the other one infrequently. He does have contact with his ex wife and they have a good relationship. They help each other out.     FAMILY HISTORY:  Family History  Problem Relation Age of Onset  . Stroke Mother   . Alcohol abuse Father   . Heart disease Other   . Other Other        not real familiar with family history  . Lung cancer Other   . Colon cancer Neg Hx   . Colon polyps Neg Hx     CURRENT MEDICATIONS:  Outpatient Encounter Medications as of  04/22/2019  Medication Sig Note  . acetaminophen (TYLENOL) 325 MG tablet Take 2 tablets (650 mg total) by mouth every 6 (six) hours as needed for mild pain, moderate pain or fever (or Fever >/= 101).   Dustin Cruz albuterol (PROVENTIL) (2.5 MG/3ML) 0.083% nebulizer solution Take 3 mLs (2.5 mg total) by nebulization every 6 (six) hours as needed for wheezing or shortness of breath. Dx---44.1   . albuterol (VENTOLIN HFA) 108 (90 Base) MCG/ACT inhaler Inhale 2 puffs into the lungs every 6 (six) hours as needed for wheezing. Dx--44.1   . apixaban (ELIQUIS)  5 MG TABS tablet Take 1 tablet (5 mg total) by mouth 2 (two) times daily. Start around July 10th after you complete  the initial starter [place] Prescription   . aspirin EC 81 MG tablet Take 1 tablet (81 mg total) by mouth daily with breakfast.   . atorvastatin (LIPITOR) 80 MG tablet Take 1 tablet by mouth once daily   . busPIRone (BUSPAR) 7.5 MG tablet Take 1 tablet (7.5 mg total) by mouth 2 (two) times daily.   . clindamycin (CLEOCIN) 150 MG capsule 2 po bid with food   . Eliquis DVT/PE Starter Arenz (ELIQUIS STARTER Baity) 5 MG TABS Take as directed on package: start with two-5mg  tablets twice daily for 7 days. On day 8, switch to one-5mg  tablet twice daily. (Patient taking differently: Take 5-10 mg by mouth See admin instructions. Take as directed on package: start with two-5mg  tablets twice daily for 7 days. On day 8, switch to one-5mg  tablet twice daily.) 04/02/2019: 10mg  taken this morning per the significant other/gf   . fenofibrate (TRICOR) 145 MG tablet Take 1 tablet by mouth once daily   . gabapentin (NEURONTIN) 600 MG tablet Take 2 tablets (1,200 mg total) by mouth 2 (two) times daily.   . insulin aspart (NOVOLOG FLEXPEN) 100 UNIT/ML FlexPen Inject 10 Units into the skin 3 (three) times daily with meals. Inject up to 10 units prior to meal per sliding scale.   . Insulin Glargine (LANTUS SOLOSTAR) 100 UNIT/ML Solostar Pen Inject 60 Units into the skin every  evening.   Dustin Cruz lisinopril (ZESTRIL) 5 MG tablet TAKE 1 TABLET BY MOUTH AT BEDTIME (Patient taking differently: Take 5 mg by mouth at bedtime. )   . metFORMIN (GLUCOPHAGE) 1000 MG tablet Take 1 tablet (1,000 mg total) by mouth 2 (two) times daily with a meal.   . nitroGLYCERIN (NITROSTAT) 0.4 MG SL tablet Place 1 tablet (0.4 mg total) under the tongue every 5 (five) minutes as needed. For chest pain.   Dustin Cruz omeprazole (PRILOSEC) 40 MG capsule TAKE 1 CAPSULE BY MOUTH TWICE DAILY   . sertraline (ZOLOFT) 100 MG tablet Take 1 tablet (100 mg total) by mouth daily.   . tamsulosin (FLOMAX) 0.4 MG CAPS capsule Take 1 capsule (0.4 mg total) by mouth daily after breakfast.   . traMADol (ULTRAM) 50 MG tablet Take 1 tablet (50 mg total) by mouth every 6 (six) hours as needed.   . traZODone (DESYREL) 150 MG tablet TAKE 1 TABLET BY MOUTH ONCE DAILY AT BEDTIME   . [DISCONTINUED] metoprolol tartrate (LOPRESSOR) 25 MG tablet Take 25 mg by mouth 2 (two) times daily.      No facility-administered encounter medications on file as of 04/22/2019.     ALLERGIES:  No Known Allergies   PHYSICAL EXAM:  ECOG Performance status: 1  Vitals:   04/22/19 1500  BP: 118/74  Pulse: 84  Resp: 16  Temp: 98.2 F (36.8 C)  SpO2: 98%   Filed Weights   04/22/19 1500  Weight: 196 lb (88.9 kg)    Physical Exam Vitals signs reviewed.  Constitutional:      Appearance: Normal appearance.  Cardiovascular:     Rate and Rhythm: Normal rate and regular rhythm.     Heart sounds: Normal heart sounds.  Pulmonary:     Effort: Pulmonary effort is normal.     Breath sounds: Normal breath sounds.  Abdominal:     General: There is no distension.     Palpations: Abdomen is soft. There  is no mass.  Musculoskeletal:        General: No swelling.  Skin:    General: Skin is warm.  Neurological:     General: No focal deficit present.     Mental Status: He is alert and oriented to person, place, and time.  Psychiatric:        Mood  and Affect: Mood normal.        Behavior: Behavior normal.      LABORATORY DATA:  I have reviewed the labs as listed.  CBC    Component Value Date/Time   WBC 7.0 04/02/2019 1612   RBC 4.47 04/02/2019 1612   HGB 14.2 04/02/2019 1612   HGB 13.5 08/13/2018 1132   HCT 43.0 04/02/2019 1612   HCT 40.9 08/13/2018 1132   PLT 420 (H) 04/02/2019 1612   PLT 400 08/13/2018 1132   MCV 96.2 04/02/2019 1612   MCV 95 08/13/2018 1132   MCH 31.8 04/02/2019 1612   MCHC 33.0 04/02/2019 1612   RDW 13.1 04/02/2019 1612   RDW 12.7 08/13/2018 1132   LYMPHSABS 1.8 04/02/2019 1612   LYMPHSABS 2.2 08/28/2017 1634   MONOABS 0.7 04/02/2019 1612   EOSABS 0.1 04/02/2019 1612   EOSABS 0.1 08/28/2017 1634   BASOSABS 0.1 04/02/2019 1612   BASOSABS 0.1 08/28/2017 1634   CMP Latest Ref Rng & Units 04/02/2019 04/02/2019 03/21/2019  Glucose 70 - 99 mg/dL - 180(H) 89  BUN 6 - 20 mg/dL - 17 20  Creatinine 0.61 - 1.24 mg/dL 1.30(H) 1.23 1.62(H)  Sodium 135 - 145 mmol/L - 134(L) 141  Potassium 3.5 - 5.1 mmol/L - 4.7 4.4  Chloride 98 - 111 mmol/L - 96(L) 107  CO2 22 - 32 mmol/L - 27 28  Calcium 8.9 - 10.3 mg/dL - 9.3 9.3  Total Protein 6.5 - 8.1 g/dL - 7.1 -  Total Bilirubin 0.3 - 1.2 mg/dL - 0.7 -  Alkaline Phos 38 - 126 U/L - 55 -  AST 15 - 41 U/L - 15 -  ALT 0 - 44 U/L - 22 -       DIAGNOSTIC IMAGING:  I have independently reviewed the scans and discussed with the patient.     ASSESSMENT & PLAN:   Splenic infarct 1.  Multiple splenic infarcts: - Patient came to the ER with abdominal pain. - CT angiogram CAP for dissection on 03/17/2019 shows multiple splenic infarcts.  Etiology is uncertain.  Splenic artery was patent without obstructive disease.  There is only minimal aortic atherosclerosis proximal to the level of splenic artery.  Hepatic steatosis.  Spleen was normal in size and contour. - Patient started on anticoagulation, currently on Eliquis.  Never had a history of prior thrombosis.  No  family history of thromboembolism. - Hypercoagulable work-up including protein C, protein S, Antithrombin III, factor V Leiden and prothrombin gene mutation were negative. - Homocystine was moderately elevated at 27.6. - Patient having toothache for the past few weeks.  A CT of the soft tissues of the neck on 04/02/2019 shows 14 mm subperiosteal abscess associated with left lateral maxillary incisor.  As a result he apparently lost about 5 to 10 pounds as he could not eat.  Otherwise he denies any fevers or night sweats. -No bleeding issues while on anticoagulation with Eliquis.  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  At this time I have recommended  long-term anticoagulation. -I have checked Jak 2 mutation testing, LDH and factor VIII which were within normal limits. - He was told to follow-up with cardiology for a TEE. -I will reevaluate him in 3 to 4 months.  2.  T2 N0 M0 malignant melanoma: - Diagnosed in 2006 with malignant melanoma, 1.3 mm in thickness, Clark level IV.  He underwent wide excision of abdominal wall and sentinel lymph node mapping.  Final pathology is negative for residual tumor other than melanoma in situ at the left upper quadrant abdominal incision site. -He follows up with Dr. Pecolia Ades at [facility].  Recent CT scans did not show any evidence of metastatic disease.  3.  Smoking history: - He smoked 1 and half Pro per day for 45 years.  Since the recent hospitalization, he cut it back to half Limberg per day. -Recent CT of the chest did not report any lung nodules.   Total time spent is 25 minutes with more than 50% of the time spent face-to-face discussing lab results, surveillance plan and coordination of care.  Orders placed this encounter:  No orders of the defined types were placed in this encounter.     Derek Jack,  MD [place] [phone]

## 2019-04-22 NOTE — Patient Instructions (Signed)
[place] at [facility] Discharge Instructions  You were seen today by Dr. Delton Coombes. He went over your recent scan results. He will see you back in 4 months for labs and follow up.   Thank you for choosing [place] at [facility] to provide your oncology and hematology care.  To afford each patient quality time with our provider, please arrive at least 15 minutes before your scheduled appointment time.   If you have a lab appointment with the [facility] please come in thru the  Main Entrance and check in at the main information desk  You need to re-schedule your appointment should you arrive 10 or more minutes late.  We strive to give you quality time with our providers, and arriving late affects you and other patients whose appointments are after yours.  Also, if you no show three or more times for appointments you may be dismissed from the clinic at the providers discretion.     Again, thank you for choosing [facility].  Our hope is that these requests will decrease the amount of time that you wait before being seen by our physicians.       _____________________________________________________________  Should you have questions after your visit to [facility], please contact our office at [phone] between the hours of 8:00 a.m. and 4:30 p.m.  Voicemails left after 4:00 p.m. will not be returned until the following business day.  For prescription refill requests, have your pharmacy contact our office and allow 72 hours.    Cancer Center Support Programs:   > Cancer Support Group  2nd Tuesday of the month 1pm-2pm, Journey Room

## 2019-04-23 ENCOUNTER — Telehealth: Payer: Self-pay | Admitting: Family Medicine

## 2019-04-23 NOTE — Telephone Encounter (Signed)
Patient states he talked to [facility] about dentist and name and lost the paper. Please call patient at home [phone].

## 2019-04-24 ENCOUNTER — Telehealth: Payer: Self-pay | Admitting: *Deleted

## 2019-04-24 NOTE — Telephone Encounter (Signed)
Left message

## 2019-04-24 NOTE — Telephone Encounter (Signed)
Spoke with pt regarding

## 2019-04-25 ENCOUNTER — Ambulatory Visit (INDEPENDENT_AMBULATORY_CARE_PROVIDER_SITE_OTHER): Payer: Medicare Other | Admitting: Licensed Clinical Social Worker

## 2019-04-25 DIAGNOSIS — K089 Disorder of teeth and supporting structures, unspecified: Secondary | ICD-10-CM

## 2019-04-25 DIAGNOSIS — F329 Major depressive disorder, single episode, unspecified: Secondary | ICD-10-CM | POA: Diagnosis not present

## 2019-04-25 DIAGNOSIS — I10 Essential (primary) hypertension: Secondary | ICD-10-CM | POA: Diagnosis not present

## 2019-04-25 DIAGNOSIS — N183 Chronic kidney disease, stage 3 unspecified: Secondary | ICD-10-CM

## 2019-04-25 DIAGNOSIS — J441 Chronic obstructive pulmonary disease with (acute) exacerbation: Secondary | ICD-10-CM

## 2019-04-25 DIAGNOSIS — E1159 Type 2 diabetes mellitus with other circulatory complications: Secondary | ICD-10-CM

## 2019-04-25 DIAGNOSIS — E1122 Type 2 diabetes mellitus with diabetic chronic kidney disease: Secondary | ICD-10-CM

## 2019-04-25 DIAGNOSIS — Z720 Tobacco use: Secondary | ICD-10-CM

## 2019-04-25 DIAGNOSIS — Z794 Long term (current) use of insulin: Secondary | ICD-10-CM

## 2019-04-25 DIAGNOSIS — F32A Depression, unspecified: Secondary | ICD-10-CM

## 2019-04-25 NOTE — Patient Instructions (Addendum)
Licensed Clinical Social Worker Visit Information  Goals we discussed today:   Goals    . Client said he had dental care issues and needs to see dentist for care (pt-stated)     Barriers:  Dental pain issues Financial Challenges  Clinical Social Work Goal: Over next 30 days , client  will work with LCSW to address concerns related to dental care needs of client  Interventions: LCSW encouraged client to communicate with RNCM regarding nursing needs of client Talked with client about current dental needs of client and dental resources for client in the area  Talked with client about financial needs of client.  Patient Self Care Activities: Self administers medications as prescribed Attends all scheduled provider appointments Performs ADL's independently  Plan: Client to call LCSW as needed to discuss psychosocial needs of client LCSW to call client in next 3 weeks to discuss current client needs including dental needs of client Client to communicate with RN CM to discuss nursing needs of client.          Materials Provided: No  Follow Up Plan: LCSW to call client in next 3 weeks to talk with client about social work needs of client and about dental needs of client  The patient verbalized understanding of instructions provided today and declined a print copy of patient instruction materials.   Norva Riffle.Raffaella Edison MSW, LCSW Licensed Clinical Social Worker [facility] [phone]

## 2019-04-25 NOTE — Chronic Care Management (AMB) (Signed)
  Care Management Note   Dustin Cruz is a 53 y.o. year old male who is a primary care patient of [name], DO. The CM team was consulted for assistance with chronic disease management and care coordination.   I reached out to [place] by phone today.   Review of patient status, including review of consultants reports, relevant laboratory and other test results, and collaboration with appropriate care team members and the patient's provider was performed as part of comprehensive patient evaluation and provision of chronic care management services.   Social Determinants of Health:risk for Stress; risk for financial strain; risk for tobacco use; risk for social isolation; risk for food insecurity    Office Visit from 03/19/2019 in [place]  PHQ-9 Total Score  0      Goals    . Client said he had dental care issues and needs to see dentist for care (pt-stated)     Barriers:  Dental pain issues Financial Challenges  Clinical Social Work Goal: Over next 30 days , client  will work with LCSW to address concerns related to dental care needs of client  Interventions: LCSW encouraged client to communicate with RNCM regarding nursing needs of client Talked with client about current dental needs of client and dental resources for client in the area Talked with client about financial needs of client.  Patient Self Care Activities: Self administers medications as prescribed Attends all scheduled provider appointments Performs ADL's independently  Plan: Client to call LCSW as needed to discuss psychosocial needs of client LCSW to call client in next 3 weeks to discuss current client needs including dental needs of client Client to communicate with RN CM to discuss nursing needs of client.         .   Client had dental extraction in [place] last month.  He said that mouth pain is better.He has prescribed medications and is taking  medications as prescribed.  He said he is now driving to appointments and to do errands as needed. Client spoke of stomach issues and leg issues.  He said he is waiting to hear from medical provider office when his scheduled appointment will be to address these issues related to his stomach and legs. . He said he is not having any dental pain at present, He said he is eating well. He said he has support from his girlfriend.   Follow Up Plan: LCSW to call client in next 3 weeks to discuss client current needs, including dental needs of client.  Norva Riffle.Rontrell Moquin MSW, LCSW Licensed Clinical Social Worker [facility] [phone]

## 2019-05-08 ENCOUNTER — Ambulatory Visit (HOSPITAL_COMMUNITY): Payer: Medicare Other | Admitting: Hematology

## 2019-05-09 ENCOUNTER — Ambulatory Visit: Payer: Medicare Other | Admitting: Student

## 2019-05-12 ENCOUNTER — Other Ambulatory Visit: Payer: Self-pay | Admitting: Family Medicine

## 2019-05-12 DIAGNOSIS — E785 Hyperlipidemia, unspecified: Secondary | ICD-10-CM

## 2019-05-14 ENCOUNTER — Ambulatory Visit (INDEPENDENT_AMBULATORY_CARE_PROVIDER_SITE_OTHER): Payer: Medicare Other | Admitting: Physician Assistant

## 2019-05-14 ENCOUNTER — Other Ambulatory Visit: Payer: Self-pay

## 2019-05-14 ENCOUNTER — Encounter: Payer: Self-pay | Admitting: Physician Assistant

## 2019-05-14 ENCOUNTER — Other Ambulatory Visit: Payer: Self-pay | Admitting: Family Medicine

## 2019-05-14 VITALS — BP 137/73 | HR 90 | Temp 98.1°F | Ht 74.0 in | Wt 192.8 lb

## 2019-05-14 DIAGNOSIS — N472 Paraphimosis: Secondary | ICD-10-CM | POA: Diagnosis not present

## 2019-05-14 DIAGNOSIS — R339 Retention of urine, unspecified: Secondary | ICD-10-CM | POA: Diagnosis not present

## 2019-05-14 LAB — URINALYSIS, COMPLETE
Bilirubin, UA: NEGATIVE
Ketones, UA: NEGATIVE
Nitrite, UA: NEGATIVE
Specific Gravity, UA: 1.01 (ref 1.005–1.030)
Urobilinogen, Ur: 0.2 mg/dL (ref 0.2–1.0)
pH, UA: 5 (ref 5.0–7.5)

## 2019-05-14 LAB — MICROSCOPIC EXAMINATION
Epithelial Cells (non renal): >10 /hpf — AB (ref 0–10)
Renal Epithel, UA: NONE SEEN /hpf
WBC, UA: >30 /hpf — AB (ref 0–5)

## 2019-05-14 MED ORDER — BUSPIRONE HCL 7.5 MG PO TABS: 7.5000 mg | ORAL_TABLET | Freq: Two times a day (BID) | ORAL | 2 refills | Status: DC

## 2019-05-14 MED ORDER — CEPHALEXIN 500 MG PO CAPS: 500.0000 mg | ORAL_CAPSULE | Freq: Three times a day (TID) | ORAL | 0 refills | Status: DC

## 2019-05-15 ENCOUNTER — Encounter: Payer: Self-pay | Admitting: Physician Assistant

## 2019-05-15 LAB — URINE CULTURE

## 2019-05-15 NOTE — Progress Notes (Signed)
BP 137/73   Pulse 90   Temp 98.1 F (36.7 C) (Oral)   Ht 6\' 2"  (1.88 m)   Wt 192 lb 12.8 oz (87.5 kg)   BMI 24.75 kg/m    Subjective:    Patient ID: Dustin Cruz, male    DOB: 1966/04/04, 53 y.o.   MRN: [id number]  HPI: Dustin Cruz is a 53 y.o. male presenting on 05/14/2019 for trouble urinating  This patient has had episodes of this over the past few weeks.  I have gone back and reviewed his notes.  And he was treated for paraphimosis and infection.  And it seemed like it did get better.  He was not able to get to urology because of COVID restrictions.  And so now he is having problems again.  He is uncircumcised.  He states at this point is having difficulty with urination and burning.  There is some discharge at the head of the penis.  Past Medical History:  Diagnosis Date  . Anxiety   . Asthma   . CKD stage 3 due to type 2 diabetes mellitus ([place])   . COPD (chronic obstructive pulmonary disease) ([place])   . Coronary artery disease    s/p BMS to [facility] 10/2010;  Cath 01/22/12 prox 30-40% LAD, LCx ramus w/ hazy 70-80% in-stent restenosis, EF 60% treated medically  . Coronary artery disease    s/p BMS to [facility] 10/2010;  Cath 01/22/12 prox 30-40% LAD, LCx ramus w/ hazy 70-80% in-stent restenosis, EF 60% treated medically   . Depression   . Diabetes mellitus    Type 2  . Gastroparesis 05/2013  . GERD (gastroesophageal reflux disease)   . HTN (hypertension)   . Hyperlipidemia   . Hyperlipidemia   . Major depression, chronic 08/13/2012  . Melanoma ([place]) 2007   surgery at [facility], Followed by [facility]  . Myocardial infarction ([place]) 2012  . Obesity   . Tobacco abuse   . Tubular adenoma   . Urinary retention    Relevant past medical, surgical, family and social history reviewed and updated as indicated. Interim medical history since our last visit reviewed. Allergies and medications reviewed and updated. DATA REVIEWED: CHART IN EPIC  Family History reviewed for pertinent findings.  Review of Systems  Constitutional: Negative.  Negative for appetite change and fatigue.  Eyes: Negative for pain and visual disturbance.  Respiratory: Negative.  Negative for cough, chest tightness, shortness of breath and wheezing.   Cardiovascular: Negative.  Negative for chest pain, palpitations and leg swelling.  Gastrointestinal: Negative.  Negative for abdominal pain, diarrhea, nausea and vomiting.  Genitourinary: Positive for decreased urine volume, difficulty urinating, discharge, dysuria, penile pain and penile swelling. Negative for genital sores, scrotal swelling, testicular pain and urgency.  Skin: Negative.  Negative for color change and rash.  Neurological: Negative.  Negative for weakness, numbness and headaches.  Psychiatric/Behavioral: Negative.     Allergies as of 05/14/2019   No Known Allergies     Medication List       Accurate as of May 14, 2019 11:59 PM. If you have any questions, ask your nurse or doctor.        STOP taking these medications   clindamycin 150 MG capsule Commonly known as: CLEOCIN Stopped by: Terald Sleeper, PA-C     TAKE these medications   acetaminophen 325 MG tablet Commonly known as: TYLENOL Take 2 tablets (650 mg total) by mouth every 6 (six) hours as needed for mild pain,  moderate pain or fever (or Fever >/= 101).   albuterol (2.5 MG/3ML) 0.083% nebulizer solution Commonly known as: PROVENTIL Take 3 mLs (2.5 mg total) by nebulization every 6 (six) hours as needed for wheezing or shortness of breath. Dx---44.1   albuterol 108 (90 Base) MCG/ACT inhaler Commonly known as: VENTOLIN HFA Inhale 2 puffs into the lungs every 6 (six) hours as needed for wheezing. Dx--44.1   apixaban 5 MG Tabs tablet Commonly known as: Eliquis Take 1 tablet (5 mg total) by mouth 2 (two) times daily. Start around July 10th after you complete  the initial starter [place] Prescription   aspirin EC 81 MG tablet Take 1 tablet (81 mg total) by mouth daily with  breakfast.   atorvastatin 80 MG tablet Commonly known as: LIPITOR Take 1 tablet by mouth once daily   busPIRone 7.5 MG tablet Commonly known as: BUSPAR Take 1 tablet (7.5 mg total) by mouth 2 (two) times daily.   cephALEXin 500 MG capsule Commonly known as: KEFLEX Take 1 capsule (500 mg total) by mouth 3 (three) times daily. Started by: Terald Sleeper, PA-C   fenofibrate 145 MG tablet Commonly known as: TRICOR Take 1 tablet by mouth once daily   gabapentin 600 MG tablet Commonly known as: Neurontin Take 2 tablets (1,200 mg total) by mouth 2 (two) times daily.   insulin aspart 100 UNIT/ML FlexPen Commonly known as: NovoLOG FlexPen Inject 10 Units into the skin 3 (three) times daily with meals. Inject up to 10 units prior to meal per sliding scale.   Lantus SoloStar 100 UNIT/ML Solostar Pen Generic drug: Insulin Glargine Inject 60 Units into the skin every evening.   lisinopril 5 MG tablet Commonly known as: ZESTRIL TAKE 1 TABLET BY MOUTH AT BEDTIME   metFORMIN 1000 MG tablet Commonly known as: Glucophage Take 1 tablet (1,000 mg total) by mouth 2 (two) times daily with a meal.   nitroGLYCERIN 0.4 MG SL tablet Commonly known as: NITROSTAT Place 1 tablet (0.4 mg total) under the tongue every 5 (five) minutes as needed. For chest pain.   omeprazole 40 MG capsule Commonly known as: PRILOSEC TAKE 1 CAPSULE BY MOUTH TWICE DAILY   sertraline 100 MG tablet Commonly known as: ZOLOFT Take 1 tablet (100 mg total) by mouth daily.   tamsulosin 0.4 MG Caps capsule Commonly known as: FLOMAX Take 1 capsule (0.4 mg total) by mouth daily after breakfast.   traMADol 50 MG tablet Commonly known as: ULTRAM Take 1 tablet (50 mg total) by mouth every 6 (six) hours as needed.   traZODone 150 MG tablet Commonly known as: DESYREL TAKE 1 TABLET BY MOUTH ONCE DAILY AT BEDTIME          Objective:    BP 137/73   Pulse 90   Temp 98.1 F (36.7 C) (Oral)   Ht 6\' 2"  (1.88 m)   Wt  192 lb 12.8 oz (87.5 kg)   BMI 24.75 kg/m   No Known Allergies  Wt Readings from Last 3 Encounters:  05/14/19 192 lb 12.8 oz (87.5 kg)  04/22/19 196 lb (88.9 kg)  04/08/19 194 lb 8 oz (88.2 kg)    Physical Exam Vitals signs and nursing note reviewed.  Constitutional:      General: He is not in acute distress.    Appearance: He is well-developed.  HENT:     Head: Normocephalic and atraumatic.  Eyes:     Conjunctiva/sclera: Conjunctivae normal.     Pupils: Pupils are equal, round,  and reactive to light.  Cardiovascular:     Rate and Rhythm: Normal rate and regular rhythm.  Pulmonary:     Effort: Pulmonary effort is normal. No respiratory distress.  Genitourinary:    Penis: Uncircumcised. Paraphimosis, erythema and discharge present.   Skin:    General: Skin is warm and dry.  Psychiatric:        Behavior: Behavior normal.     Results for orders placed or performed in visit on 05/14/19  Microscopic Examination   URINE  Result Value Ref Range   WBC, UA >30 (A) 0 - 5 /hpf   RBC 11-30 (A) 0 - 2 /hpf   Epithelial Cells (non renal) >10 (A) 0 - 10 /hpf   Renal Epithel, UA None seen None seen /hpf   Mucus, UA Present Not Estab.   Bacteria, UA Few None seen/Few  Urinalysis, Complete  Result Value Ref Range   Specific Gravity, UA 1.010 1.005 - 1.030   pH, UA 5.0 5.0 - 7.5   Color, UA Yellow Yellow   Appearance Ur Cloudy (A) Clear   Leukocytes,UA 1+ (A) Negative   Protein,UA 2+ (A) Negative/Trace   Glucose, UA 3+ (A) Negative   Ketones, UA Negative Negative   RBC, UA 2+ (A) Negative   Bilirubin, UA Negative Negative   Urobilinogen, Ur 0.2 0.2 - 1.0 mg/dL   Nitrite, UA Negative Negative   Microscopic Examination See below:       Assessment & Plan:   1. Unable to void - Urine Culture - Urinalysis, Complete - Ambulatory referral to Urology - Microscopic Examination  2. Paraphimosis - cephALEXin (KEFLEX) 500 MG capsule; Take 1 capsule (500 mg total) by mouth 3  (three) times daily.  Dispense: 30 capsule; Refill: 0 - Ambulatory referral to Urology - Microscopic Examination   Continue all other maintenance medications as listed above.  Follow up plan: Return in about 6 weeks (around 06/25/2019) for PCP, [facility].  Educational handout given for paraphimosis  Terald Sleeper PA-C [place] [address]  [place], [place] [phone]   05/15/2019, 10:34 PM

## 2019-05-16 ENCOUNTER — Ambulatory Visit: Payer: Self-pay | Admitting: Licensed Clinical Social Worker

## 2019-05-16 ENCOUNTER — Telehealth: Payer: Self-pay | Admitting: *Deleted

## 2019-05-16 DIAGNOSIS — E1159 Type 2 diabetes mellitus with other circulatory complications: Secondary | ICD-10-CM

## 2019-05-16 DIAGNOSIS — E1122 Type 2 diabetes mellitus with diabetic chronic kidney disease: Secondary | ICD-10-CM

## 2019-05-16 DIAGNOSIS — R8279 Other abnormal findings on microbiological examination of urine: Secondary | ICD-10-CM | POA: Diagnosis not present

## 2019-05-16 DIAGNOSIS — K089 Disorder of teeth and supporting structures, unspecified: Secondary | ICD-10-CM

## 2019-05-16 DIAGNOSIS — N471 Phimosis: Secondary | ICD-10-CM | POA: Diagnosis not present

## 2019-05-16 DIAGNOSIS — F32A Depression, unspecified: Secondary | ICD-10-CM

## 2019-05-16 DIAGNOSIS — R81 Glycosuria: Secondary | ICD-10-CM | POA: Diagnosis not present

## 2019-05-16 DIAGNOSIS — Z720 Tobacco use: Secondary | ICD-10-CM

## 2019-05-16 DIAGNOSIS — F329 Major depressive disorder, single episode, unspecified: Secondary | ICD-10-CM

## 2019-05-16 DIAGNOSIS — J441 Chronic obstructive pulmonary disease with (acute) exacerbation: Secondary | ICD-10-CM

## 2019-05-16 NOTE — Chronic Care Management (AMB) (Signed)
  Care Management Note   Dustin Cruz is a 53 y.o. year old male who is a primary care patient of [name], DO. The CM team was consulted for assistance with chronic disease management and care coordination.   I reached out to [place] by phone today.     Review of patient status, including review of consultants reports, relevant laboratory and other test results, and collaboration with appropriate care team members and the patient's provider was performed as part of comprehensive patient evaluation and provision of chronic care management services.   Social Determinants of Health:risk of financial strain; risk of tobacco use; risk of social isolation;risk of stress    Office Visit from 03/19/2019 in [facility]  PHQ-9 Total Score  0     GAD 7 : Generalized Anxiety Score 11/22/2018  Nervous, Anxious, on Edge 0  Control/stop worrying 0  Worry too much - different things 0  Trouble relaxing 0  Restless 0  Easily annoyed or irritable 0  Afraid - awful might happen 0  Total GAD 7 Score 0  Anxiety Difficulty Not difficult at all    .  Client had dental extraction in [place] last month.  He said that mouth pain is better.He has prescribed medications and is taking medications as prescribed.  He said he is now driving to appointments and to do errands as needed. Client spoke of stomach issues and leg issues.  He said he is waiting to hear from medical provider office when his scheduled appointment will be to address these issues related to his stomach and legs. . He said he is not having any dental pain at present, He said he is eating well. He said he has support from his girlfriend. Client spoke of pain in urinationClient has appointment on 05/16/2019 with urologist in [place], [place]. Again, client said he has no dental pain at present. Client said he has appointment on 06/04/2019 with cardiologist in [place], [place].    Follow Up: LCSW to call client  in next 3 weeks to assess the psychosocial needs of client at that time.   Norva Riffle.Raivyn Kabler MSW, LCSW Licensed Clinical Social Worker [facility] [phone]

## 2019-05-16 NOTE — Telephone Encounter (Signed)
Aware of lab results  

## 2019-05-16 NOTE — Patient Instructions (Addendum)
Licensed Clinical Water engineer Provided: No  .  Client had dental extraction in [place] last month. He said that mouth pain is better.He has prescribed medications and is taking medications as prescribed. He said he is now driving to appointments and to do errands as needed. Client spoke of stomach issues and leg issues. He said he is waiting to hear from medical provider office when his scheduled appointment will be to address these issues related to his stomach and legs. . He said he is not having any dental pain at present, He said he is eating well. He said he has support from his girlfriend. Client spoke of pain in urinationClient has appointment on 05/16/2019 with urologist in [place], [place]. Again, client said he has no dental pain at present. Client said he has appointment on 06/04/2019 with cardiologist in [place], [place].    Follow Up Plan: LCSW to call client in next 3 weeks to assess the psychosocial needs of  client at that time  The patient verbalized understanding of instructions provided today and declined a print copy of patient instruction materials.   Norva Riffle.Genella Bas MSW, LCSW Licensed Clinical Social Worker [facility] [phone]

## 2019-05-21 ENCOUNTER — Other Ambulatory Visit: Payer: Self-pay | Admitting: Family Medicine

## 2019-05-21 NOTE — Telephone Encounter (Signed)
What is the name of the medication? Albuterol 0.0083%  Have you contacted your pharmacy to request a refill? YES  Which pharmacy would you like this sent to? [facility]   Patient notified that their request is being sent to the clinical staff for review and that they should receive a call once it is complete. If they do not receive a call within 24 hours they can check with their pharmacy or our office.

## 2019-05-22 ENCOUNTER — Ambulatory Visit: Payer: Self-pay | Admitting: Licensed Clinical Social Worker

## 2019-05-22 DIAGNOSIS — F329 Major depressive disorder, single episode, unspecified: Secondary | ICD-10-CM

## 2019-05-22 DIAGNOSIS — N183 Chronic kidney disease, stage 3 unspecified: Secondary | ICD-10-CM

## 2019-05-22 DIAGNOSIS — E1159 Type 2 diabetes mellitus with other circulatory complications: Secondary | ICD-10-CM

## 2019-05-22 DIAGNOSIS — Z720 Tobacco use: Secondary | ICD-10-CM

## 2019-05-22 DIAGNOSIS — F32A Depression, unspecified: Secondary | ICD-10-CM

## 2019-05-22 DIAGNOSIS — E1122 Type 2 diabetes mellitus with diabetic chronic kidney disease: Secondary | ICD-10-CM

## 2019-05-22 DIAGNOSIS — K089 Disorder of teeth and supporting structures, unspecified: Secondary | ICD-10-CM

## 2019-05-22 DIAGNOSIS — Z794 Long term (current) use of insulin: Secondary | ICD-10-CM

## 2019-05-22 DIAGNOSIS — J441 Chronic obstructive pulmonary disease with (acute) exacerbation: Secondary | ICD-10-CM

## 2019-05-22 MED ORDER — ALBUTEROL SULFATE (2.5 MG/3ML) 0.083% IN NEBU: 2.5000 mg | INHALATION_SOLUTION | Freq: Four times a day (QID) | RESPIRATORY_TRACT | 0 refills | Status: DC | PRN

## 2019-05-22 NOTE — Patient Instructions (Addendum)
Licensed Clinical Water engineer Provided: No  Client and LCSW have been talking about housing options for client. Client said he is interested in finding a residence, perhaps an apartment, in [place], [place].  [place] and client have been talking about apartment options for client in [place], [place]. Client said he is no longer having dental issues of concern. He has prescribed medications and is taking medications as prescribed. He is trying to attend scheduled client medical appointments. LCSW encouraged client to call RNCM as needed to discuss nursing needs of client.   Follow Up Plan:LCSW to call client in next 3 weeks to talk with client about housing needs of client and talk with him about social work needs of client   The patient verbalized understanding of instructions provided today and declined a print copy of patient instruction materials.   Norva Riffle.Armstead Heiland MSW, LCSW Licensed Clinical Social Worker [facility] [phone]

## 2019-05-22 NOTE — Chronic Care Management (AMB) (Signed)
  Care Management Note   Dustin Cruz is a 53 y.o. year old male who is a primary care patient of [name], DO. The CM team was consulted for assistance with chronic disease management and care coordination.   I reached out to Dustin Cruz by phone today.   Review of patient status, including review of consultants reports, relevant laboratory and other test results, and collaboration with appropriate care team members and the patient's provider was performed as part of comprehensive patient evaluation and provision of chronic care management services.   Social Determinants of Health: risk for social isolation; risk for tobacco use; risk for stress; risk for financial strain    Office Visit from 03/19/2019 in [place]  PHQ-9 Total Score  0     GAD 7 : Generalized Anxiety Score 11/22/2018  Nervous, Anxious, on Edge 0  Control/stop worrying 0  Worry too much - different things 0  Trouble relaxing 0  Restless 0  Easily annoyed or irritable 0  Afraid - awful might happen 0  Total GAD 7 Score 0  Anxiety Difficulty Not difficult at all   Client and LCSW have been talking about housing options for client. Client said he is interested in finding a residence, perhaps an apartment, in [place], [place].  [place] and client have been talking about apartment options for client in [place], [place]. Client said he is no longer having dental issues of concern. He has prescribed medications and is taking medications as prescribed. He is trying to attend scheduled client medical appointments. LCSW encouraged client to call RNCM as needed to discuss nursing needs of client.   Follow Up Plan: LCSW to call client in next 3 weeks to talk with client about housing needs of client and about social work needs of client  Dustin Cruz.Dustin Cruz MSW, LCSW Licensed Clinical Social Worker [facility] [phone]

## 2019-05-22 NOTE — Telephone Encounter (Signed)
LMOVM refill sent to pharmacy 

## 2019-05-23 ENCOUNTER — Ambulatory Visit: Payer: Self-pay | Admitting: *Deleted

## 2019-05-23 ENCOUNTER — Telehealth: Payer: Self-pay

## 2019-05-23 DIAGNOSIS — E1142 Type 2 diabetes mellitus with diabetic polyneuropathy: Secondary | ICD-10-CM

## 2019-05-23 DIAGNOSIS — Z599 Problem related to housing and economic circumstances, unspecified: Secondary | ICD-10-CM | POA: Insufficient documentation

## 2019-05-23 DIAGNOSIS — I10 Essential (primary) hypertension: Secondary | ICD-10-CM

## 2019-05-23 NOTE — Chronic Care Management (AMB) (Signed)
  Chronic Care Management   Care Coordination Note  05/23/2019 Name: Dustin Cruz MRN: [id number] DOB: [birth year]  Assisted LCSW with referral placement to Care Guide requesting help with affordable housing resources.    Follow up plan: The care management team will reach out to the patient again over the next 30 days.   [name] BSN, RN-BC Embedded Chronic Care Manager [facility] [place] [facility] Management Direct Dial: [phone]

## 2019-05-23 NOTE — Telephone Encounter (Signed)
05/22/2019 Emailed community resources for low income housing to [employer] per request from [employer], [employer]. Spoke with Theadore Nan 8/14 he has contacted patient with housing resources.  8/14 Spoke with patient he has no other needs right now.  Closing referral. Ambrose Mantle [phone]

## 2019-06-02 ENCOUNTER — Other Ambulatory Visit: Payer: Self-pay | Admitting: Family Medicine

## 2019-06-04 ENCOUNTER — Ambulatory Visit: Payer: Medicare Other | Admitting: Student

## 2019-06-05 ENCOUNTER — Other Ambulatory Visit: Payer: Self-pay | Admitting: Family Medicine

## 2019-06-06 ENCOUNTER — Other Ambulatory Visit: Payer: Self-pay | Admitting: *Deleted

## 2019-06-06 ENCOUNTER — Ambulatory Visit (INDEPENDENT_AMBULATORY_CARE_PROVIDER_SITE_OTHER): Payer: Medicare Other | Admitting: Licensed Clinical Social Worker

## 2019-06-06 ENCOUNTER — Telehealth: Payer: Self-pay | Admitting: *Deleted

## 2019-06-06 DIAGNOSIS — I10 Essential (primary) hypertension: Secondary | ICD-10-CM

## 2019-06-06 DIAGNOSIS — E1142 Type 2 diabetes mellitus with diabetic polyneuropathy: Secondary | ICD-10-CM | POA: Diagnosis not present

## 2019-06-06 DIAGNOSIS — F32A Depression, unspecified: Secondary | ICD-10-CM

## 2019-06-06 DIAGNOSIS — J441 Chronic obstructive pulmonary disease with (acute) exacerbation: Secondary | ICD-10-CM

## 2019-06-06 DIAGNOSIS — Z599 Problem related to housing and economic circumstances, unspecified: Secondary | ICD-10-CM

## 2019-06-06 DIAGNOSIS — E785 Hyperlipidemia, unspecified: Secondary | ICD-10-CM

## 2019-06-06 DIAGNOSIS — F329 Major depressive disorder, single episode, unspecified: Secondary | ICD-10-CM

## 2019-06-06 DIAGNOSIS — K089 Disorder of teeth and supporting structures, unspecified: Secondary | ICD-10-CM

## 2019-06-06 DIAGNOSIS — Z720 Tobacco use: Secondary | ICD-10-CM

## 2019-06-06 DIAGNOSIS — Z794 Long term (current) use of insulin: Secondary | ICD-10-CM | POA: Diagnosis not present

## 2019-06-06 DIAGNOSIS — R059 Cough, unspecified: Secondary | ICD-10-CM

## 2019-06-06 DIAGNOSIS — R05 Cough: Secondary | ICD-10-CM

## 2019-06-06 MED ORDER — METFORMIN HCL 1000 MG PO TABS: 1000.0000 mg | ORAL_TABLET | Freq: Two times a day (BID) | ORAL | 0 refills | Status: DC

## 2019-06-06 MED ORDER — LISINOPRIL 5 MG PO TABS: 5.0000 mg | ORAL_TABLET | Freq: Every day | ORAL | 0 refills | Status: DC

## 2019-06-06 MED ORDER — SERTRALINE HCL 100 MG PO TABS: 100.0000 mg | ORAL_TABLET | Freq: Every day | ORAL | 0 refills | Status: DC

## 2019-06-06 MED ORDER — NOVOLOG FLEXPEN 100 UNIT/ML ~~LOC~~ SOPN: 10.0000 [IU] | PEN_INJECTOR | Freq: Three times a day (TID) | SUBCUTANEOUS | 2 refills | Status: DC

## 2019-06-06 MED ORDER — TRAZODONE HCL 150 MG PO TABS: 150.0000 mg | ORAL_TABLET | Freq: Every day | ORAL | 0 refills | Status: DC

## 2019-06-06 MED ORDER — FENOFIBRATE 145 MG PO TABS: 145.0000 mg | ORAL_TABLET | Freq: Every day | ORAL | 0 refills | Status: DC

## 2019-06-06 MED ORDER — GABAPENTIN 600 MG PO TABS: 1200.0000 mg | ORAL_TABLET | Freq: Two times a day (BID) | ORAL | 0 refills | Status: DC

## 2019-06-06 MED ORDER — APIXABAN 5 MG PO TABS: 5.0000 mg | ORAL_TABLET | Freq: Two times a day (BID) | ORAL | 0 refills | Status: DC

## 2019-06-06 MED ORDER — LANTUS SOLOSTAR 100 UNIT/ML ~~LOC~~ SOPN: 60.0000 [IU] | PEN_INJECTOR | Freq: Every evening | SUBCUTANEOUS | 2 refills | Status: DC

## 2019-06-06 MED ORDER — ALBUTEROL SULFATE (2.5 MG/3ML) 0.083% IN NEBU: 2.5000 mg | INHALATION_SOLUTION | Freq: Four times a day (QID) | RESPIRATORY_TRACT | 0 refills | Status: DC | PRN

## 2019-06-06 MED ORDER — ATORVASTATIN CALCIUM 80 MG PO TABS: 80.0000 mg | ORAL_TABLET | Freq: Every day | ORAL | 0 refills | Status: DC

## 2019-06-06 MED ORDER — ALBUTEROL SULFATE HFA 108 (90 BASE) MCG/ACT IN AERS: 2.0000 | INHALATION_SPRAY | Freq: Four times a day (QID) | RESPIRATORY_TRACT | 1 refills | Status: DC | PRN

## 2019-06-06 MED ORDER — BUSPIRONE HCL 7.5 MG PO TABS: 7.5000 mg | ORAL_TABLET | Freq: Two times a day (BID) | ORAL | 0 refills | Status: DC

## 2019-06-06 NOTE — Progress Notes (Signed)
DR Lajuana Ripple - pt has a call today with CCM-Scott and is having trouble getting his medications straight and also having trouble getting pharm changed since he moved to [place], [place].  I mentioned that [facility] in [place] will deliver and pre- [place] and he would like that.  Can we sign over these RX for him   ( he does not have transportation )

## 2019-06-06 NOTE — Patient Instructions (Addendum)
Licensed Clinical Social Worker Visit Information  Goals we discussed today:  Goals        . Client said he had dental care issues and needs to see dentist for care (pt-stated)     Barriers:  Dental pain issues Financial Challenges  Clinical Social Work Goal: Over next 30 days , client  will work with LCSW to address concerns related to dental care needs of client  Interventions: LCSW encouraged client to communicate with RNCM regarding nursing needs of client .Talked with Dustin Cruz about his housing needs Talked with Dustin Cruz about his medication procurement needs  Talked with client about current dental needs of client and dental resources for client in the area  Patient Self Care Activities: Self administers medications as prescribed Attends all scheduled provider appointments Performs ADL's independently  Plan: Client to call LCSW as needed to discuss psychosocial needs of client LCSW to call client in next 3 weeks to discuss current client needs including dental needs of client Client to communicate with RN CM to discuss nursing needs of client.           Materials Provided: No  Follow Up Plan: LCSW to call client in next 3 weeks to discuss current client needs including dental needs of client.  The patient verbalized understanding of instructions provided today and declined a print copy of patient instruction materials.   Dustin Cruz MSW, LCSW Licensed Clinical Social Worker [facility] [phone]

## 2019-06-06 NOTE — Progress Notes (Signed)
Yes.  That is fine to do.
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
## 2019-06-06 NOTE — Progress Notes (Signed)
Yes, he should be able to have [facility] call his current pharmacy for transfer.  Should not need new rx.
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
## 2019-06-06 NOTE — Progress Notes (Signed)
Sent meds.

## 2019-06-06 NOTE — Progress Notes (Signed)
Dr Lajuana Ripple - he does not have active rx from [facility] = he is out of several meds. Can we sign these over?

## 2019-06-09 ENCOUNTER — Other Ambulatory Visit: Payer: Self-pay

## 2019-06-10 ENCOUNTER — Encounter: Payer: Self-pay | Admitting: Family Medicine

## 2019-06-10 ENCOUNTER — Ambulatory Visit (INDEPENDENT_AMBULATORY_CARE_PROVIDER_SITE_OTHER): Payer: Medicare Other | Admitting: Family Medicine

## 2019-06-10 VITALS — BP 137/79 | HR 96 | Temp 98.0°F | Ht 74.0 in | Wt 183.0 lb

## 2019-06-10 DIAGNOSIS — R200 Anesthesia of skin: Secondary | ICD-10-CM | POA: Diagnosis not present

## 2019-06-10 DIAGNOSIS — R300 Dysuria: Secondary | ICD-10-CM

## 2019-06-10 DIAGNOSIS — R202 Paresthesia of skin: Secondary | ICD-10-CM | POA: Diagnosis not present

## 2019-06-10 DIAGNOSIS — Z7409 Other reduced mobility: Secondary | ICD-10-CM | POA: Diagnosis not present

## 2019-06-10 DIAGNOSIS — M47817 Spondylosis without myelopathy or radiculopathy, lumbosacral region: Secondary | ICD-10-CM

## 2019-06-10 LAB — MICROSCOPIC EXAMINATION
Epithelial Cells (non renal): >10 /hpf — AB (ref 0–10)
Renal Epithel, UA: NONE SEEN /hpf

## 2019-06-10 LAB — URINALYSIS, COMPLETE
Bilirubin, UA: NEGATIVE
Ketones, UA: NEGATIVE
Nitrite, UA: NEGATIVE
Specific Gravity, UA: <1.005 — ABNORMAL LOW (ref 1.005–1.030)
Urobilinogen, Ur: 0.2 mg/dL (ref 0.2–1.0)
pH, UA: 5 (ref 5.0–7.5)

## 2019-06-10 MED ORDER — CEFTRIAXONE SODIUM 1 G IJ SOLR
1.0000 g | Freq: Once | INTRAMUSCULAR | Status: AC
  Administered 2019-06-10: 1 g via INTRAMUSCULAR

## 2019-06-10 MED ORDER — FLUCONAZOLE 150 MG PO TABS: 150.0000 mg | ORAL_TABLET | Freq: Once | ORAL | 0 refills | Status: AC

## 2019-06-10 MED ORDER — SULFAMETHOXAZOLE-TRIMETHOPRIM 800-160 MG PO TABS: 1.0000 | ORAL_TABLET | Freq: Two times a day (BID) | ORAL | 0 refills | Status: AC

## 2019-06-10 NOTE — Progress Notes (Signed)
Subjective: CC: Left leg numb PCP: [name], DO [id number] Dustin Cruz is a 53 y.o. male presenting to clinic today for:  1.  Numbness Patient reports a 4-day history of progressive left leg numbness.  He points the anterior aspect of the leg down into the foot as the source of numbness and tingling.  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  He denies any saddle anesthesia, fecal incontinence but does report some decreased urinary output and dysuria.  He has history of previous urinary obstruction.  He reports weakness in the left lower extremity and feels that he has to drag the leg secondary to pain.  He is on gabapentin for known neuropathy.  He has history of diabetes.    ROS: Per HPI  No Known Allergies Past Medical History:  Diagnosis Date   Anxiety    Asthma    CKD stage 3 due to type 2 diabetes mellitus (HCC)    COPD (chronic obstructive pulmonary disease) ([facility])    Coronary artery disease    s/p BMS to [facility] 10/2010;  Cath 01/22/12 prox 30-40% LAD, LCx ramus w/ hazy 70-80% in-stent restenosis, EF 60% treated medically   Coronary artery disease    s/p BMS to [facility] 10/2010;  Cath 01/22/12 prox 30-40% LAD, LCx ramus w/ hazy 70-80% in-stent restenosis, EF 60% treated medically    Depression    Diabetes mellitus    Type 2   Gastroparesis 05/2013   GERD (gastroesophageal reflux disease)    HTN (hypertension)    Hyperlipidemia    Hyperlipidemia    Major depression, chronic 08/13/2012   Melanoma ([place]) 2007   surgery at [facility], Followed by [facility]   Myocardial infarction [facility]) 2012   Obesity    Tobacco abuse    Tubular adenoma    Urinary retention     Current Outpatient Medications:    acetaminophen (TYLENOL) 325 MG tablet, Take 2 tablets (650 mg total) by mouth every 6 (six) hours as needed for mild pain, moderate pain or fever (or Fever >/= 101)., Disp: 30 tablet, Rfl: 2   albuterol (PROVENTIL) (2.5 MG/3ML) 0.083% nebulizer solution, Take 3  mLs (2.5 mg total) by nebulization every 6 (six) hours as needed for wheezing or shortness of breath. Dx---44.1, Disp: 540 mL, Rfl: 0   albuterol (VENTOLIN HFA) 108 (90 Base) MCG/ACT inhaler, Inhale 2 puffs into the lungs every 6 (six) hours as needed for wheezing. Dx--44.1, Disp: 18 g, Rfl: 1   apixaban (ELIQUIS) 5 MG TABS tablet, Take 1 tablet (5 mg total) by mouth 2 (two) times daily., Disp: 180 tablet, Rfl: 0   aspirin EC 81 MG tablet, Take 1 tablet (81 mg total) by mouth daily with breakfast., Disp: 30 tablet, Rfl: 5   atorvastatin (LIPITOR) 80 MG tablet, Take 1 tablet (80 mg total) by mouth daily., Disp: 90 tablet, Rfl: 0   busPIRone (BUSPAR) 7.5 MG tablet, Take 1 tablet (7.5 mg total) by mouth 2 (two) times daily., Disp: 180 tablet, Rfl: 0   cephALEXin (KEFLEX) 500 MG capsule, Take 1 capsule (500 mg total) by mouth 3 (three) times daily., Disp: 30 capsule, Rfl: 0   fenofibrate (TRICOR) 145 MG tablet, Take 1 tablet (145 mg total) by mouth daily., Disp: 90 tablet, Rfl: 0   gabapentin (NEURONTIN) 600 MG tablet, Take 2 tablets (1,200 mg total) by mouth 2 (two) times daily., Disp: 180 tablet, Rfl: 0   insulin aspart (NOVOLOG FLEXPEN) 100 UNIT/ML FlexPen, Inject 10  Units into the skin 3 (three) times daily with meals. Inject up to 10 units prior to meal per sliding scale., Disp: 15 mL, Rfl: 2   Insulin Glargine (LANTUS SOLOSTAR) 100 UNIT/ML Solostar Pen, Inject 60 Units into the skin every evening., Disp: 3 mL, Rfl: 2   lisinopril (ZESTRIL) 5 MG tablet, Take 1 tablet (5 mg total) by mouth at bedtime., Disp: 90 tablet, Rfl: 0   metFORMIN (GLUCOPHAGE) 1000 MG tablet, Take 1 tablet (1,000 mg total) by mouth 2 (two) times daily with a meal., Disp: 180 tablet, Rfl: 0   nitroGLYCERIN (NITROSTAT) 0.4 MG SL tablet, Place 1 tablet (0.4 mg total) under the tongue every 5 (five) minutes as needed. For chest pain., Disp: 25 tablet, Rfl: 3   omeprazole (PRILOSEC) 40 MG capsule, TAKE 1 CAPSULE BY  MOUTH TWICE DAILY, Disp: 180 capsule, Rfl: 1   sertraline (ZOLOFT) 100 MG tablet, Take 1 tablet (100 mg total) by mouth daily., Disp: 90 tablet, Rfl: 0   traZODone (DESYREL) 150 MG tablet, Take 1 tablet (150 mg total) by mouth at bedtime., Disp: 90 tablet, Rfl: 0 Social History   Socioeconomic History   Marital status: Divorced    Spouse name: rayann Marina   Number of children: 0   Years of education: Not on file   Highest education level: Not on file  Occupational History   Occupation: disabled  Social Designer, fashion/clothing strain: Somewhat hard   Food insecurity    Worry: Sometimes true    Inability: Never true   Transportation needs    Medical: No    Non-medical: Not on file  Tobacco Use   Smoking status: Current Every Day Smoker    Packs/day: 1.00    Years: 31.00    Kirschbaum years: 31.00    Types: Cigarettes    Start date: 10/09/1981   Smokeless tobacco: Never Used   Tobacco comment: 1/2 [place] daily 11/01/17  Substance and Sexual Activity   Alcohol use: No    Alcohol/week: 0.0 standard drinks    Comment: 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   Drug use: No   Sexual activity: Yes  Lifestyle   Physical activity    Days per week: 7 days    Minutes per session: 30 min   Stress: Rather much  Relationships   Social connections    Talks on phone: More than three times a week    Gets together: More than three times a week    Attends religious service: Never    Active member of club or organization: No    Attends meetings of clubs or organizations: Never    Relationship status: Divorced   Intimate partner violence    Fear of current or ex partner: No    Emotionally abused: No    Physically abused: No    Forced sexual activity: No  Other Topics Concern   Not on file  Social History Narrative   Lives at home in an apartment alone. He is divorced and does not have any children. He has two  brothers but is estranged from one. He only talks with the other one infrequently. He does have contact with his ex wife and they have a good relationship. They help each other out.    Family History  Problem Relation Age of Onset   Stroke Mother    Alcohol abuse Father    Heart disease Other  Other Other        not real familiar with family history   Lung cancer Other    Colon cancer Neg Hx    Colon polyps Neg Hx     Objective: Office vital signs reviewed. BP 137/79    Pulse 96    Temp 98 F (36.7 C) (Temporal)    Ht 6\' 2"  (1.88 m)    Wt 183 lb (83 kg)    BMI 23.50 kg/m   Physical Examination:  General: Awake, alert, chronically ill appearing, No acute distress GU: no CVA TTP Extremities: warm, well perfused, No edema, cyanosis or clubbing; +2 pulses bilaterally MSK: antlagic gait and station; dragging LLE. 4/5 LLE strength Skin: dry; intact; no rashes or lesions Neuro: decreased light touch sensation in L5 dermatomal pattern on Left. Right LE sensation in tact.  Results for orders placed or performed in visit on 06/10/19 (from the past 24 hour(s))  Urinalysis, Complete     Status: Abnormal   Collection Time: 06/10/19  3:49 PM  Result Value Ref Range   Specific Gravity, UA <1.005 (L) 1.005 - 1.030   pH, UA 5.0 5.0 - 7.5   Color, UA Amber (A) Yellow   Appearance Ur Cloudy (A) Clear   Leukocytes,UA Trace (A) Negative   Protein,UA 2+ (A) Negative/Trace   Glucose, UA 3+ (A) Negative   Ketones, UA Negative Negative   RBC, UA 2+ (A) Negative   Bilirubin, UA Negative Negative   Urobilinogen, Ur 0.2 0.2 - 1.0 mg/dL   Nitrite, UA Negative Negative   Microscopic Examination See below:    Narrative   Performed at:  [facility] [address], [place], [place]  [id number] Lab Director: Colletta Maryland [facility], Phone:  [phone]  Microscopic Examination     Status: Abnormal   Collection Time: 06/10/19  3:49 PM   URINE  Result Value Ref Range   WBC, UA 11-30  (A) 0 - 5 /hpf   RBC 3-10 (A) 0 - 2 /hpf   Epithelial Cells (non renal) >10 (A) 0 - 10 /hpf   Renal Epithel, UA None seen None seen /hpf   Bacteria, UA Few (A) None seen/Few   Yeast, UA Present None seen   Narrative   Performed at:  [facility] [address], [place], [place]  [id number] Lab Director: Colletta Maryland [facility], Phone:  [phone]     Assessment/ Plan: 53 y.o. male   1. Dysuria Urinalysis notable for increased white blood cells, red blood cells, bacteria and yeast.  I have placed him on Septra twice daily for the next 7 days.  He was given a dose of Rocephin here in office as he recently completed Keflex.  I have sent this for urine culture.  We discussed red flag signs and symptoms warranting further evaluation the emergency department. - Urinalysis, Complete - Urine culture - cefTRIAXone (ROCEPHIN) injection 1 g  2. Numbness and tingling of left leg I question nerve impingement given decreased sensation and weakness.  We will proceed with MRI lumbar spine as he has had an x-ray previously which showed degenerative facet disease in the L5 on S1.  We discussed red flag signs and symptoms warranting further evaluation emergency department patient voiced good understanding.  We will plan referral pending MRI - MR Lumbar Spine Wo Contrast; Future - For home use only DME Cane  3. Facet joint disease of lumbosacral region - MR Lumbar Spine Wo Contrast; Future - For home  use only DME Cane  4. Impaired mobility I have ordered a cane and sent it to [facility] to assist with ambulation while we are working his symptoms up. - For home use only DME Cane   No orders of the defined types were placed in this encounter.  No orders of the defined types were placed in this encounter.    [name], DO [facility] [phone]

## 2019-06-10 NOTE — Patient Instructions (Signed)
I am ordering an MRI of your back.  We will place referral to the specialist pending this result.  You were given a dose of antibiotics today.  Start the pills tomorrow.   Urinary Tract Infection, Adult A urinary tract infection (UTI) is an infection of any part of the urinary tract. The urinary tract includes:  The kidneys.  The ureters.  The bladder.  The urethra. These organs make, store, and get rid of pee (urine) in the body. What are the causes? This is caused by germs (bacteria) in your genital area. These germs grow and cause swelling (inflammation) of your urinary tract. What increases the risk? You are more likely to develop this condition if:  You have a small, thin tube (catheter) to drain pee.  You cannot control when you pee or poop (incontinence).  You are male, and: ? You use these methods to prevent pregnancy: ? A medicine that kills sperm (spermicide). ? A device that blocks sperm (diaphragm). ? You have low levels of a male hormone (estrogen). ? You are pregnant.  You have genes that add to your risk.  You are sexually active.  You take antibiotic medicines.  You have trouble peeing because of: ? A prostate that is bigger than normal, if you are male. ? A blockage in the part of your body that drains pee from the bladder (urethra). ? A kidney stone. ? A nerve condition that affects your bladder (neurogenic bladder). ? Not getting enough to drink. ? Not peeing often enough.  You have other conditions, such as: ? Diabetes. ? A weak disease-fighting system (immune system). ? Sickle cell disease. ? Gout. ? Injury of the spine. What are the signs or symptoms? Symptoms of this condition include:  Needing to pee right away (urgently).  Peeing often.  Peeing small amounts often.  Pain or burning when peeing.  Blood in the pee.  Pee that smells bad or not like normal.  Trouble peeing.  Pee that is cloudy.  Fluid coming from the  vagina, if you are male.  Pain in the belly or lower back. Other symptoms include:  Throwing up (vomiting).  No urge to eat.  Feeling mixed up (confused).  Being tired and grouchy (irritable).  A fever.  Watery poop (diarrhea). How is this treated? This condition may be treated with:  Antibiotic medicine.  Other medicines.  Drinking enough water. Follow these instructions at home:  Medicines  Take over-the-counter and prescription medicines only as told by your doctor.  If you were prescribed an antibiotic medicine, take it as told by your doctor. Do not stop taking it even if you start to feel better. General instructions  Make sure you: ? Pee until your bladder is empty. ? Do not hold pee for a long time. ? Empty your bladder after sex. ? Wipe from front to back after pooping if you are a male. Use each tissue one time when you wipe.  Drink enough fluid to keep your pee pale yellow.  Keep all follow-up visits as told by your doctor. This is important. Contact a doctor if:  You do not get better after 1-2 days.  Your symptoms go away and then come back. Get help right away if:  You have very bad back pain.  You have very bad pain in your lower belly.  You have a fever.  You are sick to your stomach (nauseous).  You are throwing up. Summary  A urinary tract infection (UTI) is  an infection of any part of the urinary tract.  This condition is caused by germs in your genital area.  There are many risk factors for a UTI. These include having a small, thin tube to drain pee and not being able to control when you pee or poop.  Treatment includes antibiotic medicines for germs.  Drink enough fluid to keep your pee pale yellow. This information is not intended to replace advice given to you by your health care provider. Make sure you discuss any questions you have with your health care provider. Document Released: 03/13/2008 Document Revised:  09/12/2018 Document Reviewed: 04/04/2018 Elsevier Patient Education  2020 [employer].

## 2019-06-12 DIAGNOSIS — Z7409 Other reduced mobility: Secondary | ICD-10-CM | POA: Diagnosis not present

## 2019-06-12 DIAGNOSIS — R202 Paresthesia of skin: Secondary | ICD-10-CM | POA: Diagnosis not present

## 2019-06-12 DIAGNOSIS — R200 Anesthesia of skin: Secondary | ICD-10-CM | POA: Diagnosis not present

## 2019-06-17 ENCOUNTER — Ambulatory Visit (HOSPITAL_COMMUNITY): Payer: Medicare Other

## 2019-06-18 ENCOUNTER — Other Ambulatory Visit: Payer: Self-pay | Admitting: Urology

## 2019-06-23 ENCOUNTER — Other Ambulatory Visit: Payer: Self-pay

## 2019-06-23 ENCOUNTER — Ambulatory Visit (HOSPITAL_COMMUNITY)
Admission: RE | Admit: 2019-06-23 | Discharge: 2019-06-23 | Disposition: A | Discharge status: Home / Self Care | Payer: Medicare Other | Source: Ambulatory Visit | Attending: Family Medicine | Admitting: Family Medicine

## 2019-06-23 DIAGNOSIS — R202 Paresthesia of skin: Secondary | ICD-10-CM | POA: Insufficient documentation

## 2019-06-23 DIAGNOSIS — M47817 Spondylosis without myelopathy or radiculopathy, lumbosacral region: Secondary | ICD-10-CM | POA: Insufficient documentation

## 2019-06-23 DIAGNOSIS — M545 Low back pain: Secondary | ICD-10-CM | POA: Diagnosis not present

## 2019-06-23 DIAGNOSIS — R200 Anesthesia of skin: Secondary | ICD-10-CM | POA: Diagnosis not present

## 2019-06-24 ENCOUNTER — Other Ambulatory Visit: Payer: Self-pay | Admitting: Family Medicine

## 2019-06-24 DIAGNOSIS — M47817 Spondylosis without myelopathy or radiculopathy, lumbosacral region: Secondary | ICD-10-CM

## 2019-06-24 DIAGNOSIS — R200 Anesthesia of skin: Secondary | ICD-10-CM

## 2019-06-27 ENCOUNTER — Other Ambulatory Visit: Payer: Self-pay

## 2019-06-27 ENCOUNTER — Ambulatory Visit: Payer: Medicare Other | Admitting: Cardiology

## 2019-06-27 NOTE — Progress Notes (Deleted)
Cardiology Office Note   Date:  06/27/2019   ID:  Dustin Cruz, DOB 20-Dec-1965, MRN [id number]  PCP:  [name], DO  Cardiologist:  Dr. Jacinta Shoe    No chief complaint on file.     History of Present Illness: Dustin Cruz is a 53 y.o. male who presents for past due follow up last seen 11/01/17.   He has a hx of coronary artery disease and was found to have critical stenosis of a small ramus in 2011 and underwent bare metal stent placement. Repeat catheterization in April 2013 showed in-stent stenosis of 70-80%. Medical therapy was advised that due to the small size of the vessel.  He also has HTN, diabetes mellitus, GERD s/p esophageal dilatation, and hyperlipidemia.  He has chronic LBBB,   Carotid Dopplers on 10/21/15 showed mild atherosclerotic plaque bilaterally with no significant stenosis.  Echo 03/21/19 with normal EF 60-65%. Moderate concentric LVH.  Septal dyssynergy due to LBBB.  Today ***  Past Medical History:  Diagnosis Date  . Anxiety   . Asthma   . CKD stage 3 due to type 2 diabetes mellitus ([place])   . COPD (chronic obstructive pulmonary disease) ([place])   . Coronary artery disease    s/p BMS to Ramus 10/2010;  Cath 01/22/12 prox 30-40% LAD, LCx ramus w/ hazy 70-80% in-stent restenosis, EF 60% treated medically  . Coronary artery disease    s/p BMS to Ramus 10/2010;  Cath 01/22/12 prox 30-40% LAD, LCx ramus w/ hazy 70-80% in-stent restenosis, EF 60% treated medically   . Depression   . Diabetes mellitus    Type 2  . Gastroparesis 05/2013  . GERD (gastroesophageal reflux disease)   . HTN (hypertension)   . Hyperlipidemia   . Hyperlipidemia   . Major depression, chronic 08/13/2012  . Melanoma ([place]) 2007   surgery at [facility], Followed by [facility]  . Myocardial infarction ([place]) 2012  . Obesity   . Tobacco abuse   . Tubular adenoma   . Urinary retention     Past Surgical History:  Procedure Laterality Date  . BIOPSY  01/07/2018   Procedure: BIOPSY;   Surgeon: Daneil Dolin, MD;  Location: [facility];  Service: Endoscopy;;  duodenal and gastric biopsy  . BIOPSY  04/25/2018   Procedure: BIOPSY;  Surgeon: Daneil Dolin, MD;  Location: [facility];  Service: Endoscopy;;  ascending and descending and sigmoid biopsies  . CARDIAC CATHETERIZATION     with stent  . COLONOSCOPY N/A 01/27/2013   [id number] and colonic polyps. Tubular adenomas, poor bowel prep, one-year follow-up surveillance colonoscopy recommended  . COLONOSCOPY WITH PROPOFOL N/A 04/25/2018   Procedure: COLONOSCOPY WITH PROPOFOL;  Surgeon: Daneil Dolin, MD;  Location: [facility];  Service: Endoscopy;  Laterality: N/A;  11:00am - pt to be prepped INPT and labs to be done as well  . CORONARY ANGIOPLASTY WITH STENT PLACEMENT    . ESOPHAGOGASTRODUODENOSCOPY  04/24/2012   [place]k-mild erosive reflux esophagitis,dilated w/24F Venia Minks, small HH, minimal chronic gastric/bulbar erosions(No H pylori)  . ESOPHAGOGASTRODUODENOSCOPY (EGD) WITH PROPOFOL N/A 01/07/2018   Dr. Gala Romney: Erythematous mucosa in the stomach, retained gastric contents, incomplete exam.  Biopsied showed mild chronic gastritis but no H. pylori.  Duodenal biopsy was negative for celiac disease.  [name] FLEXIBLE SIGMOIDOSCOPY N/A 01/07/2018   Dr. Gala Romney: Incomplete colonoscopy due to inadequate bowel prep  . LEFT HEART CATHETERIZATION WITH CORONARY ANGIOGRAM N/A 01/22/2012   Procedure: LEFT HEART CATHETERIZATION WITH CORONARY ANGIOGRAM;  Surgeon: Jolaine Artist, MD;  Location: [facility] CATH LAB;  Service: Cardiovascular;  Laterality: N/A;  . melanoma surgery  2007   [place], removed lymph nodes under arm as well,Left abd     Current Outpatient Medications  Medication Sig Dispense Refill  . acetaminophen (TYLENOL) 325 MG tablet Take 2 tablets (650 mg total) by mouth every 6 (six) hours as needed for mild pain, moderate pain or fever (or Fever >/= 101). 30 tablet 2  . albuterol (PROVENTIL) (2.5 MG/3ML) 0.083% nebulizer solution Take  3 mLs (2.5 mg total) by nebulization every 6 (six) hours as needed for wheezing or shortness of breath. Dx---44.1 540 mL 0  . albuterol (VENTOLIN HFA) 108 (90 Base) MCG/ACT inhaler Inhale 2 puffs into the lungs every 6 (six) hours as needed for wheezing. Dx--44.1 18 g 1  . apixaban (ELIQUIS) 5 MG TABS tablet Take 1 tablet (5 mg total) by mouth 2 (two) times daily. 180 tablet 0  . aspirin EC 81 MG tablet Take 1 tablet (81 mg total) by mouth daily with breakfast. 30 tablet 5  . atorvastatin (LIPITOR) 80 MG tablet Take 1 tablet (80 mg total) by mouth daily. 90 tablet 0  . busPIRone (BUSPAR) 7.5 MG tablet Take 1 tablet (7.5 mg total) by mouth 2 (two) times daily. 180 tablet 0  . fenofibrate (TRICOR) 145 MG tablet Take 1 tablet (145 mg total) by mouth daily. 90 tablet 0  . gabapentin (NEURONTIN) 600 MG tablet Take 2 tablets (1,200 mg total) by mouth 2 (two) times daily. 180 tablet 0  . insulin aspart (NOVOLOG FLEXPEN) 100 UNIT/ML FlexPen Inject 10 Units into the skin 3 (three) times daily with meals. Inject up to 10 units prior to meal per sliding scale. 15 mL 2  . Insulin Glargine (LANTUS SOLOSTAR) 100 UNIT/ML Solostar Pen Inject 60 Units into the skin every evening. 3 mL 2  . lisinopril (ZESTRIL) 5 MG tablet Take 1 tablet (5 mg total) by mouth at bedtime. 90 tablet 0  . metFORMIN (GLUCOPHAGE) 1000 MG tablet Take 1 tablet (1,000 mg total) by mouth 2 (two) times daily with a meal. 180 tablet 0  . nitroGLYCERIN (NITROSTAT) 0.4 MG SL tablet Place 1 tablet (0.4 mg total) under the tongue every 5 (five) minutes as needed. For chest pain. 25 tablet 3  . omeprazole (PRILOSEC) 40 MG capsule TAKE 1 CAPSULE BY MOUTH TWICE DAILY 180 capsule 1  . sertraline (ZOLOFT) 100 MG tablet Take 1 tablet (100 mg total) by mouth daily. 90 tablet 0  . traZODone (DESYREL) 150 MG tablet Take 1 tablet (150 mg total) by mouth at bedtime. 90 tablet 0   No current facility-administered medications for this visit.     Allergies:    Patient has no known allergies.    Social History:  The patient  reports that he has been smoking cigarettes. He started smoking about 37 years ago. He has a 31.00 Tholl-year smoking history. He has never used smokeless tobacco. He reports that he does not drink alcohol or use drugs.   Family History:  The patient's ***family history includes Alcohol abuse in his father; Heart disease in an other family member; Lung cancer in an other family member; Other in an other family member; Stroke in his mother.    ROS:  General:no colds or fevers, no weight changes Skin:no rashes or ulcers HEENT:no blurred vision, no congestion CV:see HPI PUL:see HPI GI:no diarrhea constipation or melena, no indigestion GU:no hematuria, no dysuria MS:no joint  pain, no claudication Neuro:no syncope, no lightheadedness Endo:no diabetes, no thyroid disease Wt Readings from Last 3 Encounters:  06/10/19 183 lb (83 kg)  05/14/19 192 lb 12.8 oz (87.5 kg)  04/22/19 196 lb (88.9 kg)     PHYSICAL EXAM: VS:  There were no vitals taken for this visit. , BMI There is no height or weight on file to calculate BMI. General:Pleasant affect, NAD Skin:Warm and dry, brisk capillary refill HEENT:normocephalic, sclera clear, mucus membranes moist Neck:supple, no JVD, no bruits  Heart:S1S2 RRR without murmur, gallup, rub or click Lungs:clear without rales, rhonchi, or wheezes [id number], non tender, + BS, do not palpate liver spleen or masses Ext:no lower ext edema, 2+ pedal pulses, 2+ radial pulses Neuro:alert and oriented, MAE, follows commands, + facial symmetry    EKG:  EKG is ordered today. The ekg ordered today demonstrates ***   Recent Labs: 03/19/2019: Magnesium 1.8 04/02/2019: ALT 22; BUN 17; Creatinine, Ser 1.30; Hemoglobin 14.2; Platelets 420; Potassium 4.7; Sodium 134    Lipid Panel    Component Value Date/Time   CHOL 166 01/15/2017 1036   CHOL 277 (H) 03/19/2013 1131   TRIG 376 (H) 01/15/2017 1036    TRIG 292 (H) 03/19/2013 1131   HDL 39 (L) 01/15/2017 1036   HDL 44 03/19/2013 1131   CHOLHDL 4.3 01/15/2017 1036   CHOLHDL 3.7 05/07/2013 0955   VLDL 39 05/07/2013 0955   LDLCALC 52 01/15/2017 1036   LDLCALC 175 (H) 03/19/2013 1131       Other studies Reviewed: Additional studies/ records that were reviewed today include: ***.  Cath (April 2013):  Left main: Normal  [id number] 30-40% Mild diffuse plaquing throughout. Small bridging section in midsection. Large branching diagonal that came off ostium of LAD. Mild plaque in ostium.  LCX: Gave off small to moderate sized ramus and two small OMs. In ostium of ramus there was a BMS with 70-80% hazy in-stent restenosis. Otherwise mild plaquing in the LCX  RCA: Dominant vessel with PDA and PL.Two 30% lesions in midsection. Mild plaque in PDA.  LV-gram done in the RAO projection: Ejection fraction = 60% No regional wall motion abnormalities.  Echo 03/21/2019 IMPRESSIONS    1. The left ventricle has normal systolic function with an ejection fraction of 60-65%. The cavity size was normal. There is moderate concentric left ventricular hypertrophy. Left ventricular diastolic parameters were normal.  2. Septal dysynergy due to LBBB.  3. The right ventricle has normal systolic function. The cavity was normal. There is no increase in right ventricular wall thickness.  4. The mitral valve is grossly normal.  5. The tricuspid valve is grossly normal.  6. The aortic valve was not well visualized.  7. The aortic root is normal in size and structure.  8. The inferior vena cava was dilated in size with >50% respiratory variability.  9. No atrial level shunt detected by color flow Doppler. Agitated saline contrast was given intravenously to evaluate for intracardiac shunting. Saline contrast bubble study was negative, with no evidence of any interatrial shunt.  FINDINGS  Left Ventricle: The left ventricle has normal systolic function, with an  ejection fraction of 60-65%. The cavity size was normal. There is moderate concentric left ventricular hypertrophy. Left ventricular diastolic parameters were normal. Septal  dysynergy due to LBBB.  Right Ventricle: The right ventricle has normal systolic function. The cavity was normal. There is no increase in right ventricular wall thickness.  Left Atrium: Left atrial size was normal in size.  Right  Atrium: Right atrial size was normal in size. Right atrial pressure is estimated at 3 mmHg.  Interatrial Septum: No atrial level shunt detected by color flow Doppler. Agitated saline contrast was given intravenously to evaluate for intracardiac shunting. Saline contrast bubble study was negative, with no evidence of any interatrial shunt.  Pericardium: There is no evidence of pericardial effusion.  Mitral Valve: The mitral valve is grossly normal. Mitral valve regurgitation is mild by color flow Doppler.  Tricuspid Valve: The tricuspid valve is grossly normal. Tricuspid valve regurgitation was not visualized by color flow Doppler.  Aortic Valve: The aortic valve was not well visualized Aortic valve regurgitation was not visualized by color flow Doppler.  Pulmonic Valve: The pulmonic valve was not well visualized. Pulmonic valve regurgitation is not visualized by color flow Doppler.  Aorta: The aortic root is normal in size and structure.  Venous: The inferior vena cava is dilated in size with greater than 50% respiratory variability.  ASSESSMENT AND PLAN:  1.  ***   Current medicines are reviewed with the patient today.  The patient Has no concerns regarding medicines.  The following changes have been made:  See above Labs/ tests ordered today include:see above  Disposition:   FU:  see above  Signed, Cecilie Kicks, NP  06/27/2019 8:22 AM    [address], [place], [place], [place]  Phone: [phone]; Fax: [phone]

## 2019-06-30 ENCOUNTER — Other Ambulatory Visit: Payer: Self-pay

## 2019-06-30 ENCOUNTER — Ambulatory Visit (INDEPENDENT_AMBULATORY_CARE_PROVIDER_SITE_OTHER): Payer: Medicare Other | Admitting: Licensed Clinical Social Worker

## 2019-06-30 ENCOUNTER — Ambulatory Visit (INDEPENDENT_AMBULATORY_CARE_PROVIDER_SITE_OTHER): Payer: Medicare Other | Admitting: Family Medicine

## 2019-06-30 VITALS — BP 138/80 | HR 67 | Temp 97.7°F | Wt 188.0 lb

## 2019-06-30 DIAGNOSIS — E1165 Type 2 diabetes mellitus with hyperglycemia: Secondary | ICD-10-CM | POA: Diagnosis not present

## 2019-06-30 DIAGNOSIS — Z794 Long term (current) use of insulin: Secondary | ICD-10-CM | POA: Diagnosis not present

## 2019-06-30 DIAGNOSIS — K089 Disorder of teeth and supporting structures, unspecified: Secondary | ICD-10-CM

## 2019-06-30 DIAGNOSIS — N183 Chronic kidney disease, stage 3 (moderate): Secondary | ICD-10-CM | POA: Diagnosis not present

## 2019-06-30 DIAGNOSIS — E1122 Type 2 diabetes mellitus with diabetic chronic kidney disease: Secondary | ICD-10-CM | POA: Diagnosis not present

## 2019-06-30 DIAGNOSIS — E1142 Type 2 diabetes mellitus with diabetic polyneuropathy: Secondary | ICD-10-CM

## 2019-06-30 DIAGNOSIS — F329 Major depressive disorder, single episode, unspecified: Secondary | ICD-10-CM

## 2019-06-30 DIAGNOSIS — J441 Chronic obstructive pulmonary disease with (acute) exacerbation: Secondary | ICD-10-CM | POA: Diagnosis not present

## 2019-06-30 DIAGNOSIS — G63 Polyneuropathy in diseases classified elsewhere: Secondary | ICD-10-CM

## 2019-06-30 DIAGNOSIS — I10 Essential (primary) hypertension: Secondary | ICD-10-CM

## 2019-06-30 DIAGNOSIS — Z720 Tobacco use: Secondary | ICD-10-CM

## 2019-06-30 DIAGNOSIS — F32A Depression, unspecified: Secondary | ICD-10-CM

## 2019-06-30 LAB — BAYER DCA HB A1C WAIVED: HB A1C (BAYER DCA - WAIVED): >14 % — ABNORMAL HIGH (ref <7.0)

## 2019-06-30 MED ORDER — GABAPENTIN 600 MG PO TABS: 600.0000 mg | ORAL_TABLET | Freq: Three times a day (TID) | ORAL | 0 refills | Status: DC

## 2019-06-30 MED ORDER — TIZANIDINE HCL 4 MG PO TABS: 2.0000 mg | ORAL_TABLET | Freq: Three times a day (TID) | ORAL | 0 refills | Status: DC | PRN

## 2019-06-30 NOTE — Progress Notes (Signed)
Subjective: CC: low back pain, LLE weakness PCP: Dustin Norlander, DO [id number] Dustin Cruz is a 53 y.o. male presenting to clinic today for:  1.  Low back pain/neuropathy Patient with longstanding history of diabetic neuropathy in bilateral feet.  He was previously treated with higher dose of Neurontin but after his kidneys were found to be impaired his dose was reduced to 600 mg 3 times daily.  He notes that he has been taking this twice daily but has not felt that his neuropathy is especially controlled with it.  He would like to go back to 3 times daily dosing.  He goes on to state that he has been having low back pain.  He had an MRI recently but this did not show any significant degenerative changes that were contributing to his left lower extremity weakness.  He is using a cane and reports that he is ambulating well with it.  He would like to see an orthopedic at some point but for now is focusing on his upcoming urologic evaluation.  2.  Uncontrolled type 2 diabetes Patient reports that he has been eating carbohydrates quite a bit lately.  He has not been following any specific diet despite uncontrolled diabetes noted on last visit.  He is taking his metformin twice daily as directed.  He is also taking his Lantus and NovoLog.  No chest pain, shortness of breath.  He has neuropathy as above.   ROS: Per HPI  No Known Allergies Past Medical History:  Diagnosis Date  . Anxiety   . Asthma   . CKD stage 3 due to type 2 diabetes mellitus ([place])   . COPD (chronic obstructive pulmonary disease) ([place])   . Coronary artery disease    s/p BMS to Ramus 10/2010;  Cath 01/22/12 prox 30-40% LAD, LCx ramus w/ hazy 70-80% in-stent restenosis, EF 60% treated medically  . Coronary artery disease    s/p BMS to Ramus 10/2010;  Cath 01/22/12 prox 30-40% LAD, LCx ramus w/ hazy 70-80% in-stent restenosis, EF 60% treated medically   . Depression   . Diabetes mellitus    Type 2  . Gastroparesis 05/2013  . GERD  (gastroesophageal reflux disease)   . HTN (hypertension)   . Hyperlipidemia   . Hyperlipidemia   . Major depression, chronic 08/13/2012  . Melanoma ([place]) 2007   surgery at [facility], Followed by [facility]  . Myocardial infarction ([place]) 2012  . Obesity   . Tobacco abuse   . Tubular adenoma   . Urinary retention     Current Outpatient Medications:  .  acetaminophen (TYLENOL) 325 MG tablet, Take 2 tablets (650 mg total) by mouth every 6 (six) hours as needed for mild pain, moderate pain or fever (or Fever >/= 101)., Disp: 30 tablet, Rfl: 2 .  albuterol (PROVENTIL) (2.5 MG/3ML) 0.083% nebulizer solution, Take 3 mLs (2.5 mg total) by nebulization every 6 (six) hours as needed for wheezing or shortness of breath. Dx---44.1, Disp: 540 mL, Rfl: 0 .  albuterol (VENTOLIN HFA) 108 (90 Base) MCG/ACT inhaler, Inhale 2 puffs into the lungs every 6 (six) hours as needed for wheezing. Dx--44.1, Disp: 18 g, Rfl: 1 .  apixaban (ELIQUIS) 5 MG TABS tablet, Take 1 tablet (5 mg total) by mouth 2 (two) times daily., Disp: 180 tablet, Rfl: 0 .  aspirin EC 81 MG tablet, Take 1 tablet (81 mg total) by mouth daily with breakfast., Disp: 30 tablet, Rfl: 5 .  atorvastatin (LIPITOR) 80 MG tablet, Take  1 tablet (80 mg total) by mouth daily., Disp: 90 tablet, Rfl: 0 .  busPIRone (BUSPAR) 7.5 MG tablet, Take 1 tablet (7.5 mg total) by mouth 2 (two) times daily., Disp: 180 tablet, Rfl: 0 .  fenofibrate (TRICOR) 145 MG tablet, Take 1 tablet (145 mg total) by mouth daily., Disp: 90 tablet, Rfl: 0 .  gabapentin (NEURONTIN) 600 MG tablet, Take 1 tablet (600 mg total) by mouth 3 (three) times daily., Disp: 270 tablet, Rfl: 0 .  insulin aspart (NOVOLOG FLEXPEN) 100 UNIT/ML FlexPen, Inject 10 Units into the skin 3 (three) times daily with meals. Inject up to 10 units prior to meal per sliding scale., Disp: 15 mL, Rfl: 2 .  Insulin Glargine (LANTUS SOLOSTAR) 100 UNIT/ML Solostar Pen, Inject 60 Units into the skin every evening., Disp:  3 mL, Rfl: 2 .  lisinopril (ZESTRIL) 5 MG tablet, Take 1 tablet (5 mg total) by mouth at bedtime., Disp: 90 tablet, Rfl: 0 .  metFORMIN (GLUCOPHAGE) 1000 MG tablet, Take 1 tablet (1,000 mg total) by mouth 2 (two) times daily with a meal., Disp: 180 tablet, Rfl: 0 .  nitroGLYCERIN (NITROSTAT) 0.4 MG SL tablet, Place 1 tablet (0.4 mg total) under the tongue every 5 (five) minutes as needed. For chest pain., Disp: 25 tablet, Rfl: 3 .  omeprazole (PRILOSEC) 40 MG capsule, TAKE 1 CAPSULE BY MOUTH TWICE DAILY, Disp: 180 capsule, Rfl: 1 .  sertraline (ZOLOFT) 100 MG tablet, Take 1 tablet (100 mg total) by mouth daily., Disp: 90 tablet, Rfl: 0 .  tiZANidine (ZANAFLEX) 4 MG tablet, Take 0.5-1 tablets (2-4 mg total) by mouth every 8 (eight) hours as needed for muscle spasms., Disp: 30 tablet, Rfl: 0 .  traZODone (DESYREL) 150 MG tablet, Take 1 tablet (150 mg total) by mouth at bedtime., Disp: 90 tablet, Rfl: 0 Social History   Socioeconomic History  . Marital status: Divorced    Spouse name: [name]  . Number of children: 0  . Years of education: Not on file  . Highest education level: Not on file  Occupational History  . Occupation: disabled  Social Needs  . Financial resource strain: Somewhat hard  . Food insecurity    Worry: Sometimes true    Inability: Never true  . Transportation needs    Medical: No    Non-medical: Not on file  Tobacco Use  . Smoking status: Current Every Day Smoker    Packs/day: 1.00    Years: 31.00    [place] years: 31.00    Types: Cigarettes    Start date: 10/09/1981  . Smokeless tobacco: Never Used  . Tobacco comment: 1/2 [place] daily 11/01/17  Substance and Sexual Activity  . 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  . Drug use: No  . Sexual activity: Yes  Lifestyle  . Physical activity    Days per week: 7 days    Minutes per session:  30 min  . Stress: Rather much  Relationships  . Social connections    Talks on phone: More than three times a week    Gets together: More than three times a week    Attends religious service: Never    Active member of club or organization: No    Attends meetings of clubs or organizations: Never    Relationship status: Divorced  . Intimate partner violence  Fear of current or ex partner: No    Emotionally abused: No    Physically abused: No    Forced sexual activity: No  Other Topics Concern  . Not on file  Social History Narrative   Lives at home in an apartment alone. He is divorced and does not have any children. He has two brothers but is estranged from one. He only talks with the other one infrequently. He does have contact with his ex wife and they have a good relationship. They help each other out.    Family History  Problem Relation Age of Onset  . Stroke Mother   . Alcohol abuse Father   . Heart disease Other   . Other Other        not real familiar with family history  . Lung cancer Other   . Colon cancer Neg Hx   . Colon polyps Neg Hx     Objective: Office vital signs reviewed. BP 138/80   Pulse 67   Temp 97.7 F (36.5 C)   Wt 188 lb (85.3 kg)   BMI 24.14 kg/m   Physical Examination:  General: Awake, alert, chronically ill appearing male. No acute distress Cardio: regular rate Pulm: normal work of breathing on room air MSK: Ambulating with use of a cane.  He is not having a significant left lower extremity drag is noted at previous visit.  Tone is normal.  Assessment/ Plan: 53 y.o. male   1. Uncontrolled type 2 diabetes mellitus with hyperglycemia (HCC) Check A1c. Questionable compliance w/ insulin/ DM meds.   - Bayer DCA Hb A1c Waived  2. CKD stage 3 due to type 2 diabetes mellitus ([place]) Most recent Cr was WNL.   - Basic Metabolic Panel  3. Polyneuropathy associated with underlying disease ([place]) Gabapentin renewed at 600 mg 3 times daily.  We  will repeat BMP today and determine whether or not he would be a candidate for increasing back to 800 mg.   Orders Placed This Encounter  Procedures  . Bayer DCA Hb A1c Waived  . Basic Metabolic Panel   Meds ordered this encounter  Medications  . gabapentin (NEURONTIN) 600 MG tablet    Sig: Take 1 tablet (600 mg total) by mouth 3 (three) times daily.    Dispense:  270 tablet    Refill:  0    Pre- Piontek for pt  . tiZANidine (ZANAFLEX) 4 MG tablet    Sig: Take 0.5-1 tablets (2-4 mg total) by mouth every 8 (eight) hours as needed for muscle spasms.    Dispense:  30 tablet    Refill:  [place], DO [place] [phone]

## 2019-06-30 NOTE — Patient Instructions (Signed)
Licensed Clinical Social Worker Visit Information  Goals we discussed today:  Goals        . Client said he had dental care issues and needs to see dentist for care (pt-stated)     Barriers:  Dental pain issues Financial Challenges  Clinical Social Work Goal: Over next 30 days , client  will work with LCSW to address concerns related to dental care needs of client  Interventions: LCSW encouraged client to communicate with RNCM regarding nursing needs of client Provided patient with information regarding dentist, Dr. Costella Hatcher, [phone], for patient to contact to schedul eclient dental appointment if needed by client Talked with client about current dental needs of client and dental resources for client in the area Talked with client about current housing situation of client.  Patient Self Care Activities: Self administers medications as prescribed Attends all scheduled provider appointments Performs ADL's independently  Plan: Client to call LCSW as needed to discuss psychosocial needs of client LCSW to call client in next 3 weeks to discuss current client needs including dental needs of client Client to communicate with RN CM to discuss nursing needs of client.  Client to call dentist, Dr. Costella Hatcher , at [phone], to schedule client dental appointment            Materials Provided: No  Follow Up Plan: LCSW to call client in next 3 weeks to talk about dental needs of client and to talk with client about housing needs of client  The patient verbalized understanding of instructions provided today and declined a print copy of patient instruction materials.   Norva Riffle.Tonilynn Bieker MSW, LCSW Licensed Clinical Social Worker [facility] [phone]

## 2019-06-30 NOTE — Chronic Care Management (AMB) (Signed)
Care Management Note   Dustin Cruz is a 53 y.o. year old male who is a primary care patient of [name], DO. The CM team was consulted for assistance with chronic disease management and care coordination.   I reached out to Sanderson by phone today.     Review of patient status, including review of consultants reports, relevant laboratory and other test results, and collaboration with appropriate care team members and the patient's provider was performed as part of comprehensive patient evaluation and provision of chronic care management services.   Social Determinants of Health: risk of tobacco use; risk for food insecurity; risk for stress    Office Visit from 06/10/2019 in [place]  PHQ-9 Total Score  0     GAD 7 : Generalized Anxiety Score 11/22/2018  Nervous, Anxious, on Edge 0  Control/stop worrying 0  Worry too much - different things 0  Trouble relaxing 0  Restless 0  Easily annoyed or irritable 0  Afraid - awful might happen 0  Total GAD 7 Score 0  Anxiety Difficulty Not difficult at all   Medications   New medications from outside sources are available for reconciliation   acetaminophen (TYLENOL) 325 MG tablet    albuterol (PROVENTIL) (2.5 MG/3ML) 0.083% nebulizer solution    albuterol (VENTOLIN HFA) 108 (90 Base) MCG/ACT inhaler    apixaban (ELIQUIS) 5 MG TABS tablet    aspirin EC 81 MG tablet    atorvastatin (LIPITOR) 80 MG tablet    busPIRone (BUSPAR) 7.5 MG tablet    fenofibrate (TRICOR) 145 MG tablet    gabapentin (NEURONTIN) 600 MG tablet    insulin aspart (NOVOLOG FLEXPEN) 100 UNIT/ML FlexPen    Insulin Glargine (LANTUS SOLOSTAR) 100 UNIT/ML Solostar Pen    lisinopril (ZESTRIL) 5 MG tablet    metFORMIN (GLUCOPHAGE) 1000 MG tablet    nitroGLYCERIN (NITROSTAT) 0.4 MG SL tablet    omeprazole (PRILOSEC) 40 MG capsule    sertraline (ZOLOFT) 100 MG tablet    traZODone (DESYREL) 150 MG tablet    metoprolol tartrate  (LOPRESSOR) 25 MG tablet         . Client said he had dental care issues and needs to see dentist for care (pt-stated)     Barriers:  Dental pain issues Financial Challenges  Clinical Social Work Goal: Over next 30 days , client  will work with LCSW to address concerns related to dental care needs of client  Interventions: LCSW encouraged client to communicate with RNCM regarding nursing needs of client Talked with client about current dental needs of client and dental resources for client in the area Provided client with name of dentist, Dr. Costella Hatcher, at [phone] for resource information Talked with client about housing situation of client.  Patient Self Care Activities: Self administers medications as prescribed Attends all scheduled provider appointments Performs ADL's independently  Plan: Client to call LCSW as needed to discuss psychosocial needs of client LCSW to call client in next 3 weeks to discuss current client needs including dental needs of client Client to communicate with RN CM to discuss nursing needs of client.  Client to call dentist, Dr. Costella Hatcher , at [phone], to schedule client dental appointment         Client spoke of leg pain issues. He said he had an MRI test recently. He spoke of occasional numbness in his left leg. He has appointment today with Dr. Lajuana Ripple at [facility]. He said he  will talk with Dr. Lajuana Ripple at appointment today about his medication needs. He said he is eating adequately. Client and LCSW spoke of client dental needs. LCSW gave client the name of dentist, Dr. Costella Hatcher and also gave [name] phone number for Dr. Jackquline Berlin said he is residing now with friends and he will have friends to help provide him with transport support as needed at this time. LCSW encouraged [name] to call LCSW as needed to discuss his social work needs.   Follow Up Plan: LCSW to call client in next 3 weeks to talk with client about dental needs of  client and about other social work needs of client  Norva Riffle.[name] MSW, LCSW Licensed Clinical Social Worker [facility] Care Management [phone]

## 2019-07-01 ENCOUNTER — Encounter (HOSPITAL_COMMUNITY): Payer: Self-pay | Admitting: Physician Assistant

## 2019-07-01 ENCOUNTER — Other Ambulatory Visit (HOSPITAL_COMMUNITY)
Admission: RE | Admit: 2019-07-01 | Discharge: 2019-07-01 | Disposition: A | Discharge status: Home / Self Care | Payer: Medicare Other | Source: Ambulatory Visit | Attending: Urology | Admitting: Urology

## 2019-07-01 DIAGNOSIS — Z20828 Contact with and (suspected) exposure to other viral communicable diseases: Secondary | ICD-10-CM | POA: Diagnosis not present

## 2019-07-01 DIAGNOSIS — Z01812 Encounter for preprocedural laboratory examination: Secondary | ICD-10-CM | POA: Diagnosis not present

## 2019-07-01 LAB — BASIC METABOLIC PANEL
BUN/Creatinine Ratio: 10 (ref 9–20)
BUN: 14 mg/dL (ref 6–24)
CO2: 22 mmol/L (ref 20–29)
Calcium: 9.1 mg/dL (ref 8.7–10.2)
Chloride: 87 mmol/L — ABNORMAL LOW (ref 96–106)
Creatinine, Ser: 1.35 mg/dL — ABNORMAL HIGH (ref 0.76–1.27)
GFR calc Af Amer: 68 mL/min/{1.73_m2} (ref >59)
GFR calc non Af Amer: 59 mL/min/{1.73_m2} — ABNORMAL LOW (ref >59)
Glucose: 763 mg/dL (ref 65–99)
Potassium: 4.8 mmol/L (ref 3.5–5.2)
Sodium: 124 mmol/L — ABNORMAL LOW (ref 134–144)

## 2019-07-01 LAB — SARS CORONAVIRUS 2 (TAT 6-24 HRS): SARS Coronavirus 2: NEGATIVE

## 2019-07-01 NOTE — Progress Notes (Signed)
Reviewed chart for pre-op interview.  Noted in epic pt is on eliquis since 06/ 2020 discharge from hospital.  And has history CAD s/p PCI with BMS to [place], last cardiac visit 11-01-2017.  Per discharge note 06/ 2020 pt was to follow up with his cardiologist, dr Bronson Ing 2-3 wks after discharge.  Pt has not followed up as advised.  Reviewed chart with anesthesia, Konrad Felix PA,  Pt needs cardiac visit prior to surgery and clearance for eliquis.  Called and lvm for selita, or scheduler for dr winter, of anesthesia recommendation.  Pt eliquis is followed by [place] [facility], dr Delton Coombes.

## 2019-07-02 ENCOUNTER — Telehealth: Payer: Self-pay | Admitting: Cardiovascular Disease

## 2019-07-02 ENCOUNTER — Encounter (HOSPITAL_COMMUNITY): Payer: Self-pay | Admitting: *Deleted

## 2019-07-02 NOTE — Telephone Encounter (Signed)
Left detailed message on Dustin Cruz's VM

## 2019-07-02 NOTE — Progress Notes (Signed)
Patient is scheduled for a circumcision next week and the office of Dr. Lovena Neighbours called requesting orders for patient to come off Eliquis prior to the procedure.    I reported the above to Dr. Delton Coombes and orders received for patient to stop Eliquis 48 hours prior to the scheduled procedure.    I called Dr. Jackson Latino office and left a detailed message with the above and if they needed anything further to please give [place] a call back.

## 2019-07-02 NOTE — Telephone Encounter (Signed)
Per notes from RN in [place] yesterday pt need cardiac clearance - has upcoming appt in October in [place] canceled September appt - does this pt need OV prior to surgery with starting Eliquis in June?

## 2019-07-02 NOTE — Telephone Encounter (Signed)
Eliquis is not managed by cardiology; it is managed by hematology.  He has a scheduled visit on October 7 and does have a history of coronary artery disease.  I would recommend he keep this appointment at which point his preoperative cardiac risk can be assessed.

## 2019-07-02 NOTE — Telephone Encounter (Signed)
Needs clearance - stated we gave clearance back in August but [facility]le has stated patient is now on Eliquis and needs clearance updated

## 2019-07-03 ENCOUNTER — Ambulatory Visit: Payer: Medicare Other | Admitting: Orthopaedic Surgery

## 2019-07-03 ENCOUNTER — Other Ambulatory Visit: Payer: Self-pay

## 2019-07-04 ENCOUNTER — Ambulatory Visit (HOSPITAL_BASED_OUTPATIENT_CLINIC_OR_DEPARTMENT_OTHER): Admission: RE | Admit: 2019-07-04 | Payer: Medicare Other | Source: Home / Self Care | Admitting: Urology

## 2019-07-04 ENCOUNTER — Other Ambulatory Visit: Payer: Self-pay | Admitting: Family Medicine

## 2019-07-04 ENCOUNTER — Encounter (HOSPITAL_BASED_OUTPATIENT_CLINIC_OR_DEPARTMENT_OTHER): Admission: RE | Payer: Self-pay | Source: Home / Self Care

## 2019-07-04 SURGERY — CIRCUMCISION, ADULT
Anesthesia: General

## 2019-07-04 NOTE — Telephone Encounter (Signed)
Patient aware.  Circumcision canceled for today

## 2019-07-04 NOTE — Telephone Encounter (Signed)
Tramadol cannot be refilled, as I have never rx'd this medication. What referral is he talking about?

## 2019-07-07 ENCOUNTER — Other Ambulatory Visit: Payer: Self-pay | Admitting: Family Medicine

## 2019-07-07 NOTE — Telephone Encounter (Signed)
Did patient see ortho 9/24?  I just sent this rx in last week.  Has he used it all?

## 2019-07-07 NOTE — Telephone Encounter (Signed)
Patient states that he has not seen Ortho.  Patient states that he does not need a refill on this medication at this time.

## 2019-07-11 ENCOUNTER — Other Ambulatory Visit: Payer: Self-pay | Admitting: Family Medicine

## 2019-07-11 DIAGNOSIS — E785 Hyperlipidemia, unspecified: Secondary | ICD-10-CM

## 2019-07-11 NOTE — Telephone Encounter (Signed)
What is the name of the medication? Trazodone 150 mg, Atorvastatin 80 mg  Have you contacted your pharmacy to request a refill? no  Which pharmacy would you like this sent to? [name]   Patient notified that their request is being sent to the clinical staff for review and that they should receive a call once it is complete. If they do not receive a call within 24 hours they can check with their pharmacy or our office.

## 2019-07-14 ENCOUNTER — Other Ambulatory Visit: Payer: Self-pay | Admitting: Physician Assistant

## 2019-07-14 MED ORDER — ATORVASTATIN CALCIUM 80 MG PO TABS: 80.0000 mg | ORAL_TABLET | Freq: Every day | ORAL | 0 refills | Status: DC

## 2019-07-14 MED ORDER — TRAZODONE HCL 150 MG PO TABS: 150.0000 mg | ORAL_TABLET | Freq: Every day | ORAL | 0 refills | Status: DC

## 2019-07-16 ENCOUNTER — Encounter: Payer: Self-pay | Admitting: Student

## 2019-07-16 ENCOUNTER — Ambulatory Visit (HOSPITAL_COMMUNITY)
Admission: RE | Admit: 2019-07-16 | Discharge: 2019-07-16 | Disposition: A | Discharge status: Home / Self Care | Payer: Medicare Other | Source: Ambulatory Visit | Attending: Student | Admitting: Student

## 2019-07-16 ENCOUNTER — Ambulatory Visit (INDEPENDENT_AMBULATORY_CARE_PROVIDER_SITE_OTHER): Payer: Medicare Other | Admitting: Student

## 2019-07-16 ENCOUNTER — Other Ambulatory Visit: Payer: Self-pay

## 2019-07-16 VITALS — BP 143/81 | Temp 98.0°F | Ht 74.0 in | Wt 190.0 lb

## 2019-07-16 DIAGNOSIS — M79605 Pain in left leg: Secondary | ICD-10-CM | POA: Insufficient documentation

## 2019-07-16 DIAGNOSIS — I2510 Atherosclerotic heart disease of native coronary artery without angina pectoris: Secondary | ICD-10-CM | POA: Diagnosis not present

## 2019-07-16 DIAGNOSIS — D735 Infarction of spleen: Secondary | ICD-10-CM

## 2019-07-16 DIAGNOSIS — Z86718 Personal history of other venous thrombosis and embolism: Secondary | ICD-10-CM

## 2019-07-16 DIAGNOSIS — E785 Hyperlipidemia, unspecified: Secondary | ICD-10-CM

## 2019-07-16 DIAGNOSIS — I10 Essential (primary) hypertension: Secondary | ICD-10-CM | POA: Diagnosis not present

## 2019-07-16 DIAGNOSIS — Z01818 Encounter for other preprocedural examination: Secondary | ICD-10-CM

## 2019-07-16 MED ORDER — APIXABAN 5 MG PO TABS: 5.0000 mg | ORAL_TABLET | Freq: Two times a day (BID) | ORAL | 11 refills | Status: DC

## 2019-07-16 NOTE — Patient Instructions (Signed)
Medication Instructions:  Your physician recommends that you continue on your current medications as directed. Please refer to the Current Medication list given to you today.  If you need a refill on your cardiac medications before your next appointment, please call your pharmacy.   Lab work: NONE   If you have labs (blood work) drawn today and your tests are completely normal, you will receive your results only by: [name] MyChart Message (if you have MyChart) OR . A paper copy in the mail If you have any lab test that is abnormal or we need to change your treatment, we will call you to review the results.  Testing/Procedures: Your physician has requested that you have a lower or upper extremity venous duplex. This test is an ultrasound of the veins in the legs or arms. It looks at venous blood flow that carries blood from the heart to the legs or arms. Allow one hour for a Lower Venous exam. Allow thirty minutes for an Upper Venous exam. There are no restrictions or special instructions.    Follow-Up: At [facility], you and your health needs are our priority.  As part of our continuing mission to provide you with exceptional heart care, we have created designated Provider Care Teams.  These Care Teams include your primary Cardiologist (physician) and Advanced Practice Providers (APPs -  Physician Assistants and Nurse Practitioners) who all work together to provide you with the care you need, when you need it. You will need a follow up appointment in 6 months.  Please call our office 2 months in advance to schedule this appointment.  You may see [name], MD or one of the following Advanced Practice Providers on your designated Care Team:   Bernerd Pho, PA-C [facility]) . Ermalinda Barrios, PA-C ([place])  Any Other Special Instructions Will Be Listed Below (If Applicable). Thank you for choosing [facility]!

## 2019-07-16 NOTE — Progress Notes (Signed)
Cardiology Office Note    Date:  07/16/2019   ID:  ADGER CANTERA, DOB 03/13/1966, MRN [id number]  PCP:  [name], DO  Cardiologist: [name], MD    Chief Complaint  Patient presents with  . Follow-up    Overdue Visit; Need for Cardiac Clearance    History of Present Illness:    Dustin Cruz is a 53 y.o. male with past medical history of CAD (s/p BMS to RI in 2012, 70-80% ISR by repeat cath in 2013 with medical management recommended due to small vessel size), HTN, HLD, and Type 2 DM who presents to the office today for preoperative cardiac clearance.  He was last examined by Dr. Bronson Ing in 10/2017 and he denied any recent chest pain or dyspnea on exertion. He was to undergo a urologic procedure in the future and an echocardiogram was recommended for preoperative risk stratification. This showed a preserved EF of 55 % and he was cleared to proceed from a cardiac perspective  In the interim, he was admitted to [facility] in 03/2019 and to have splenic infarcts by CT imaging and the etiology of the infarcts were unclear. Also found to have a DVT by review of notes (imaging report unavailable). A repeat echocardiogram was obtained and showed a preserved EF of 60 to 65% with moderate LVH but no evidence of atrial shunting. TEE was not performed and it was mentioned in notes to have this performed as an outpatient but was never obtained. He was transitioned from Heparin to Eliquis at the time of discharge.   He has been followed by Hematology and has remained on Eliquis. By review of notes from 07/02/2019 the office had been in touch with the patient regarding holding Eliquis prior to his circumcision and he was advised to hold Eliquis 48 hours prior to the procedure.  In talking with the patient today, he denies any recent chest pain or dyspnea on exertion. Was previously very active at baseline prior to his admission earlier this year but says he has decreased his activity  due to left leg pain. He is able to climb a flight of stairs and do yard work without any anginal symptoms. No recent orthopnea, PND or lower extremity edema.  He tells me today that he has been experiencing left leg pain for the past few months which resembles when he was diagnosed with a DVT. By review of his medications he is listed as taking Eliquis 5 mg twice daily but says he stopped this after the starter [place] and was not sure if he was supposed to continue on it. Therefore, he has not been on anticoagulation for over 2 months.   Past Medical History:  Diagnosis Date  . Anxiety   . Asthma   . CKD stage 3 due to type 2 diabetes mellitus ([place])   . COPD (chronic obstructive pulmonary disease) ([place])   . Coronary artery disease    s/p BMS to Ramus 10/2010;  Cath 01/22/12 prox 30-40% LAD, LCx ramus w/ hazy 70-80% in-stent restenosis, EF 60% treated medically  . Coronary artery disease    s/p BMS to Ramus 10/2010;  Cath 01/22/12 prox 30-40% LAD, LCx ramus w/ hazy 70-80% in-stent restenosis, EF 60% treated medically   . Depression   . Diabetes mellitus    Type 2  . Gastroparesis 05/2013  . GERD (gastroesophageal reflux disease)   . HTN (hypertension)   . Hyperlipidemia   . Hyperlipidemia   . Major  depression, chronic 08/13/2012  . Melanoma (HCC) 2007   surgery at [facility], Followed by [facility]  . Myocardial infarction (HCC) 2012  . Obesity   . Tobacco abuse   . Tubular adenoma   . Urinary retention     Past Surgical History:  Procedure Laterality Date  . BIOPSY  01/07/2018   Procedure: BIOPSY;  Surgeon: Rourk, Robert M, MD;  Location: [facility];  Service: Endoscopy;;  duodenal and gastric biopsy  . BIOPSY  04/25/2018   Procedure: BIOPSY;  Surgeon: Rourk, Robert M, MD;  Location: [facility];  Service: Endoscopy;;  ascending and descending and sigmoid biopsies  . CARDIAC CATHETERIZATION     with stent  . COLONOSCOPY N/A 01/27/2013   RMR:Rectal and colonic polyps. Tubular adenomas,  poor bowel prep, one-year follow-up surveillance colonoscopy recommended  . COLONOSCOPY WITH PROPOFOL N/A 04/25/2018   Procedure: COLONOSCOPY WITH PROPOFOL;  Surgeon: Rourk, Robert M, MD;  Location: [facility];  Service: Endoscopy;  Laterality: N/A;  11:00am - pt to be prepped INPT and labs to be done as well  . CORONARY ANGIOPLASTY WITH STENT PLACEMENT    . ESOPHAGOGASTRODUODENOSCOPY  04/24/2012   Rourk-mild erosive reflux esophagitis,dilated w/56F [place], small HH, minimal chronic gastric/bulbar erosions(No H pylori)  . ESOPHAGOGASTRODUODENOSCOPY (EGD) WITH PROPOFOL N/A 01/07/2018   Dr. Rourk: Erythematous mucosa in the stomach, retained gastric contents, incomplete exam.  Biopsied showed mild chronic gastritis but no H. pylori.  Duodenal biopsy was negative for celiac disease.  . FLEXIBLE SIGMOIDOSCOPY N/A 01/07/2018   Dr. Rourk: Incomplete colonoscopy due to inadequate bowel prep  . LEFT HEART CATHETERIZATION WITH CORONARY ANGIOGRAM N/A 01/22/2012   Procedure: LEFT HEART CATHETERIZATION WITH CORONARY ANGIOGRAM;  Surgeon: Daniel R Bensimhon, MD;  Location: [facility] CATH [facility];  Service: Cardiovascular;  Laterality: N/A;  . melanoma surgery  2007   [facility], removed lymph nodes under arm as well,Left abd    Current Medications: Outpatient Medications Prior to Visit  Medication Sig Dispense Refill  . acetaminophen (TYLENOL) 325 MG tablet Take 2 tablets (650 mg total) by mouth every 6 (six) hours as needed for mild pain, moderate pain or fever (or Fever >/= 101). 30 tablet 2  . albuterol (PROVENTIL) (2.5 MG/3ML) 0.083% nebulizer solution Take 3 mLs (2.5 mg total) by nebulization every 6 (six) hours as needed for wheezing or shortness of breath. Dx---44.1 540 mL 0  . albuterol (VENTOLIN HFA) 108 (90 Base) MCG/ACT inhaler Inhale 2 puffs into the lungs every 6 (six) hours as needed for wheezing. Dx--44.1 18 g 1  . aspirin EC 81 MG tablet Take 1 tablet (81 mg total) by mouth daily with breakfast. 30 tablet 5  .  atorvastatin (LIPITOR) 80 MG tablet Take 1 tablet (80 mg total) by mouth daily. 90 tablet 0  . busPIRone (BUSPAR) 7.5 MG tablet TAKE 1 TABLET BY MOUTH TWICE DAILY 60 tablet 2  . fenofibrate (TRICOR) 145 MG tablet Take 1 tablet (145 mg total) by mouth daily. 90 tablet 0  . gabapentin (NEURONTIN) 600 MG tablet Take 1 tablet (600 mg total) by mouth 3 (three) times daily. 270 tablet 0  . insulin aspart (NOVOLOG FLEXPEN) 100 UNIT/ML FlexPen Inject 10 Units into the skin 3 (three) times daily with meals. Inject up to 10 units prior to meal per sliding scale. 15 mL 2  . Insulin Glargine (LANTUS SOLOSTAR) 100 UNIT/ML Solostar Pen Inject 60 Units into the skin every evening. 3 mL 2  . lisinopril (ZESTRIL) 5 MG tablet Take   1 tablet (5 mg total) by mouth at bedtime. 90 tablet 0  . metFORMIN (GLUCOPHAGE) 1000 MG tablet Take 1 tablet (1,000 mg total) by mouth 2 (two) times daily with a meal. 180 tablet 0  . nitroGLYCERIN (NITROSTAT) 0.4 MG SL tablet Place 1 tablet (0.4 mg total) under the tongue every 5 (five) minutes as needed. For chest pain. 25 tablet 3  . omeprazole (PRILOSEC) 40 MG capsule TAKE 1 CAPSULE BY MOUTH TWICE DAILY 180 capsule 1  . sertraline (ZOLOFT) 100 MG tablet Take 1 tablet (100 mg total) by mouth daily. 90 tablet 0  . tiZANidine (ZANAFLEX) 4 MG tablet Take 0.5-1 tablets (2-4 mg total) by mouth every 8 (eight) hours as needed for muscle spasms. 30 tablet 0  . traZODone (DESYREL) 150 MG tablet Take 1 tablet (150 mg total) by mouth at bedtime. 90 tablet 0  . apixaban (ELIQUIS) 5 MG TABS tablet Take 1 tablet (5 mg total) by mouth 2 (two) times daily. 180 tablet 0   No facility-administered medications prior to visit.      Allergies:   Patient has no known allergies.   Social History   Socioeconomic History  . Marital status: Divorced    Spouse name: rayann Fate  . Number of children: 0  . Years of education: Not on file  . Highest education level: Not on file  Occupational History  .  Occupation: disabled  Social Needs  . Financial resource strain: Somewhat hard  . Food insecurity    Worry: Sometimes true    Inability: Never true  . Transportation needs    Medical: No    Non-medical: Not on file  Tobacco Use  . Smoking status: Current Every Day Smoker    Packs/day: 1.00    Years: 31.00    Kyllo years: 31.00    Types: Cigarettes    Start date: 10/09/1981  . Smokeless tobacco: Never Used  . Tobacco comment: 1/2 [place] daily 11/01/17  Substance and Sexual Activity  . 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  . Drug use: No  . Sexual activity: Yes  Lifestyle  . Physical activity    Days per week: 7 days    Minutes per session: 30 min  . Stress: Rather much  Relationships  . Social connections    Talks on phone: More than three times a week    Gets together: More than three times a week    Attends religious service: Never    Active member of club or organization: No    Attends meetings of clubs or organizations: Never    Relationship status: Divorced  Other Topics Concern  . Not on file  Social History Narrative   Lives at home in an apartment alone. He is divorced and does not have any children. He has two brothers but is estranged from one. He only talks with the other one infrequently. He does have contact with his ex wife and they have a good relationship. They help each other out.      Family History:  The patient's family history includes Alcohol abuse in his father; Heart disease in an other family member; Lung cancer in an other family member; Other in an other family member; Stroke in his mother.   Review of Systems:   Please see the history of present illness.  General:  No chills, fever, night sweats or weight changes. Positive for left leg pain.  Cardiovascular:  No chest pain, dyspnea on exertion, edema, orthopnea,  palpitations, paroxysmal nocturnal dyspnea. Dermatological: No rash, lesions/masses Respiratory: No cough, dyspnea Urologic: No hematuria, dysuria Abdominal:   No nausea, vomiting, diarrhea, bright red blood per rectum, melena, or hematemesis Neurologic:  No visual changes, wkns, changes in mental status. All other systems reviewed and are otherwise negative except as noted above.   Physical Exam:    VS:  BP (!) 143/81   Temp 98 F (36.7 C)   Ht 6' 2" (1.88 m)   Wt 190 lb (86.2 kg)   SpO2 96%   BMI 24.39 kg/m    General: Well developed, well nourished,male appearing in no acute distress. Head: Normocephalic, atraumatic, sclera non-icteric, no xanthomas, nares are without discharge.  Neck: No carotid bruits. JVD not elevated.  Lungs: Respirations regular and unlabored, without wheezes or rales.  Heart: Regular rate and rhythm. No S3 or S4.  No murmur, no rubs, or gallops appreciated. Abdomen: Soft, non-tender, non-distended with normoactive bowel sounds. No hepatomegaly. No rebound/guarding. No obvious abdominal masses. Msk:  Strength and tone appear normal for age. No joint deformities or effusions. Extremities: No clubbing or cyanosis. No lower extremity edema.  Distal pedal pulses are 2+ bilaterally. Neuro: Alert and oriented X 3. Moves all extremities spontaneously. No focal deficits noted. Psych:  Responds to questions appropriately with a normal affect. Skin: No rashes or lesions noted  Wt Readings from Last 3 Encounters:  07/16/19 190 lb (86.2 kg)  06/30/19 188 lb (85.3 kg)  06/10/19 183 lb (83 kg)     Studies/Labs Reviewed:   EKG:  EKG is not ordered today. EKG from 04/02/2019 is reviewed which shows NSR, HR 88 with known LBBB and PVC's.    Recent Labs: 03/19/2019: Magnesium 1.8 04/02/2019: ALT 22; Hemoglobin 14.2; Platelets 420 06/30/2019: BUN 14; Creatinine, Ser 1.35; Potassium 4.8; Sodium 124   Lipid Panel    Component Value Date/Time   CHOL 166 01/15/2017 1036    CHOL 277 (H) 03/19/2013 1131   TRIG 376 (H) 01/15/2017 1036   TRIG 292 (H) 03/19/2013 1131   HDL 39 (L) 01/15/2017 1036   HDL 44 03/19/2013 1131   CHOLHDL 4.3 01/15/2017 1036   CHOLHDL 3.7 05/07/2013 0955   VLDL 39 05/07/2013 0955   LDLCALC 52 01/15/2017 1036   LDLCALC 175 (H) 03/19/2013 1131    Additional studies/ records that were reviewed today include:   Echocardiogram: 03/2019 IMPRESSIONS    1. The left ventricle has normal systolic function with an ejection fraction of 60-65%. The cavity size was normal. There is moderate concentric left ventricular hypertrophy. Left ventricular diastolic parameters were normal.  2. Septal dysynergy due to LBBB.  3. The right ventricle has normal systolic function. The cavity was normal. There is no increase in right ventricular wall thickness.  4. The mitral valve is grossly normal.  5. The tricuspid valve is grossly normal.  6. The aortic valve was not well visualized.  7. The aortic root is normal in size and structure.  8. The inferior vena cava was dilated in size with >50% respiratory variability.  9. No atrial level shunt detected by color flow Doppler. Agitated saline contrast was given intravenously to evaluate for intracardiac shunting. Saline contrast bubble study was negative, with no evidence of any interatrial shunt.  Assessment:    1. Coronary artery disease involving native coronary artery of native  heart without angina pectoris   2. Preoperative clearance   3. History of DVT (deep vein thrombosis)   4. Left leg pain   5. Essential hypertension   6. Hyperlipidemia LDL goal <70   7. Splenic infarct      Plan:   In order of problems listed above:  1. CAD - s/p BMS to RI in 2012, 70-80% ISR by repeat cath in 2013 with medical management recommended due to small vessel size. He denies any recent chest pain or dyspnea on exertion.   - Recent echocardiogram showed his EF actually improved to 60 to 65%.   - Continue  current medication regimen at this time with ASA and statin therapy.   2. Preoperative Cardiac Clearance for Circumcision - He is able to perform 4 METS of activity without any anginal symptoms and RCRI risk is overall low at 0.9% risk of a major cardiac event. Recent echo showed EF has improved and is at 60-65%. No further cardiac testing indicated at this time. Will plan to obtain lower extremity dopplers as outlined below. We reviewed if he has a recurrent DVT, his surgery would again need to be delayed. If no evidence of recurrent DVT, he has already been cleared by Hematology to hold Eliquis for 48 hours by review of notes.   3. History of DVT/ Left Leg Pain - He was supposed to continue on Eliquis following completion of the starter [place] but did not pick up his refills that were sent in by his PCP. Given his left leg pain, will see if a lower extremity doppler can be obtained today to rule out a DVT. I did send in refills of his Eliquis and informed him of the urgency to start this. If he is positive for a DVT, will need to reorder the starter kit dose.    ADDENDUM: Lower extremity dopplers negative for DVT. Will plan to resume Eliquis at 5 mg twice daily as recommended by Hematology. Cleared to proceed with surgery from a cardiac perspective. Will forward today's note to patient's MD.   4. HTN - BP is elevated at 133/81 during today's visit but he reports it has overall well controlled when checked at home. Continue Lisinopril 5 mg daily. This can be further titrated in the future if additional BP control is needed.   5. HLD - followed by PCP. Goal LDL is less than 70 with known CAD. Remains on Atorvastatin 59m daily.   6. History of Splenic Infarcts - Was initially to undergo a TEE as an inpatient but this was not performed during admission. I reviewed with Dr. KBronson Ingtoday and he did not feel the patient needs a TEE this far out from his event as it would not influence his management  plan.  Will plan to restart Eliquis as outlined above.   Medication Adjustments/Labs and Tests Ordered: Current medicines are reviewed at length with the patient today.  Concerns regarding medicines are outlined above.  Medication changes, Labs and Tests ordered today are listed in the Patient Instructions below. Patient Instructions  Medication Instructions:  Your physician recommends that you continue on your current medications as directed. Please refer to the Current Medication list given to you today.  If you need a refill on your cardiac medications before your next appointment, please call your pharmacy.   Lab work: NONE   If you have labs (blood work) drawn today and your tests are completely normal, you will receive your results only by: .[name] KitchenMyChart  Message (if you have MyChart) OR . A paper copy in the mail If you have any lab test that is abnormal or we need to change your treatment, we will call you to review the results.  Testing/Procedures: Your physician has requested that you have a lower or upper extremity venous duplex. This test is an ultrasound of the veins in the legs or arms. It looks at venous blood flow that carries blood from the heart to the legs or arms. Allow one hour for a Lower Venous exam. Allow thirty minutes for an Upper Venous exam. There are no restrictions or special instructions.    Follow-Up: At [facility], you and your health needs are our priority.  As part of our continuing mission to provide you with exceptional heart care, we have created designated Provider Care Teams.  These Care Teams include your primary Cardiologist (physician) and Advanced Practice Providers (APPs -  Physician Assistants and Nurse Practitioners) who all work together to provide you with the care you need, when you need it. You will need a follow up appointment in 6 months.  Please call our office 2 months in advance to schedule this appointment.  You may see Suresh  Koneswaran, MD or one of the following Advanced Practice Providers on your designated Care Team:   Brittany Strader, PA-C ([place] Office) . Michele Lenze, PA-C ([place] Office)  Any Other Special Instructions Will Be Listed Below (If Applicable). Thank you for choosing [facility]!        Signed, Brittany M Strader, PA-C  07/16/2019 7:46 PM    [facility] [address] [place], [place] [address] Phone: [phone] Fax: [phone]   

## 2019-07-21 ENCOUNTER — Ambulatory Visit (INDEPENDENT_AMBULATORY_CARE_PROVIDER_SITE_OTHER): Payer: Medicare Other | Admitting: Licensed Clinical Social Worker

## 2019-07-21 DIAGNOSIS — F32A Depression, unspecified: Secondary | ICD-10-CM

## 2019-07-21 DIAGNOSIS — I10 Essential (primary) hypertension: Secondary | ICD-10-CM

## 2019-07-21 DIAGNOSIS — F329 Major depressive disorder, single episode, unspecified: Secondary | ICD-10-CM

## 2019-07-21 DIAGNOSIS — K089 Disorder of teeth and supporting structures, unspecified: Secondary | ICD-10-CM

## 2019-07-21 DIAGNOSIS — E1142 Type 2 diabetes mellitus with diabetic polyneuropathy: Secondary | ICD-10-CM | POA: Diagnosis not present

## 2019-07-21 DIAGNOSIS — Z794 Long term (current) use of insulin: Secondary | ICD-10-CM

## 2019-07-21 DIAGNOSIS — J441 Chronic obstructive pulmonary disease with (acute) exacerbation: Secondary | ICD-10-CM | POA: Diagnosis not present

## 2019-07-21 DIAGNOSIS — Z720 Tobacco use: Secondary | ICD-10-CM

## 2019-07-21 NOTE — Patient Instructions (Addendum)
Licensed Clinical Social Worker Visit Information  Goals we discussed today:  Goals        . Client said he had dental care issues and needs to see dentist for care (pt-stated)     Barriers:  Dental pain issues Financial Challenges  Clinical Social Work Goal: Over next 30 days , client  will work with LCSW to address concerns related to dental care needs of client  Interventions: LCSW encouraged client to communicate with RNCM regarding nursing needs of client Talked with client about current dental needs of client and dental resources for client in the area Talked with client about financial needs of client. Talked with client about client's recent appointment with cardiologist Talked with client about ambulation challenges of client (he sometimes uses a cane to walk) Talked with client about weakness in lower extremities (he said has some swelling in left leg and in lower extremities. Talked with client about pain issues of client Talked with client about transport needs of client  Patient Self Care Activities: Self administers medications as prescribed Attends all scheduled provider appointments Performs ADL's independently  Plan: Client to call LCSW as needed to discuss psychosocial needs of client LCSW to call client in next 3 weeks to discuss current client needs including dental needs of client Client to communicate with RN CM to discuss nursing needs of client.  Client to call dentist, Dr. Costella Hatcher , at [phone], to schedule client dental appointment           Materials Provided: No  Follow Up Plan:  LCSW to call client in next 3 weeks to discuss current client needs including dental needs of client  The patient verbalized understanding of instructions provided today and declined a print copy of patient instruction materials.   Norva Riffle.Karie Skowron MSW, LCSW Licensed Clinical Social Worker [facility]  [facility] [phone]

## 2019-07-21 NOTE — Chronic Care Management (AMB) (Signed)
Care Management Note   Dustin Cruz is a 53 y.o. year old male who is a primary care patient of [name], DO. The CM team was consulted for assistance with chronic disease management and care coordination.   I reached out to [place] by phone today.   Review of patient status, including review of consultants reports, relevant laboratory and other test results, and collaboration with appropriate care team members and the patient's provider was performed as part of comprehensive patient evaluation and provision of chronic care management services.   Social Determinants of health: risk of social isolation; risk of tobacco use; risk of financial strain; risk of stress; risk of food insecurity    Office Visit from 06/10/2019 in [place]  PHQ-9 Total Score  0     GAD 7 : Generalized Anxiety Score 11/22/2018  Nervous, Anxious, on Edge 0  Control/stop worrying 0  Worry too much - different things 0  Trouble relaxing 0  Restless 0  Easily annoyed or irritable 0  Afraid - awful might happen 0  Total GAD 7 Score 0  Anxiety Difficulty Not difficult at all   Medications   New medications from outside sources are available for reconciliation   acetaminophen (TYLENOL) 325 MG tablet    albuterol (PROVENTIL) (2.5 MG/3ML) 0.083% nebulizer solution    albuterol (VENTOLIN HFA) 108 (90 Base) MCG/ACT inhaler    apixaban (ELIQUIS) 5 MG TABS tablet    aspirin EC 81 MG tablet    atorvastatin (LIPITOR) 80 MG tablet    busPIRone (BUSPAR) 7.5 MG tablet    fenofibrate (TRICOR) 145 MG tablet    gabapentin (NEURONTIN) 600 MG tablet    insulin aspart (NOVOLOG FLEXPEN) 100 UNIT/ML FlexPen    Insulin Glargine (LANTUS SOLOSTAR) 100 UNIT/ML Solostar Pen    lisinopril (ZESTRIL) 5 MG tablet    metFORMIN (GLUCOPHAGE) 1000 MG tablet    nitroGLYCERIN (NITROSTAT) 0.4 MG SL tablet    omeprazole (PRILOSEC) 40 MG capsule    sertraline (ZOLOFT) 100 MG tablet    tiZANidine  (ZANAFLEX) 4 MG tablet    traZODone (DESYREL) 150 MG tablet    metoprolol tartrate (LOPRESSOR) 25 MG tablet     Goals        . Client said he had dental care issues and needs to see dentist for care (pt-stated)     Barriers:  Dental pain issues Financial Challenges  Clinical Social Work Goal: Over next 30 days , client  will work with LCSW to address concerns related to dental care needs of client  Interventions: LCSW encouraged client to communicate with RNCM regarding nursing needs of client Talked with client about current dental needs of client and dental resources for client in the area Talked with client about financial needs of client. Talked with client about client's recent appointment with cardiologist Talked with client about ambulation challenges of client (he sometimes uses a cane to walk) Talked with client about weakness in lower extremities (he said has some swelling in left leg and in lower extremities. Talked with client about pain issues of client Talked with client about transport needs of client  Patient Self Care Activities: Self administers medications as prescribed Attends all scheduled provider appointments Performs ADL's independently  Plan: Client to call LCSW as needed to discuss psychosocial needs of client LCSW to call client in next 3 weeks to discuss current client needs including dental needs of client Client to communicate with RN CM to  discuss nursing needs of client.  Client to call dentist, Dr. Costella Hatcher , at [phone], to schedule client dental appointment         Follow Up Plan: LCSW to call client in next 3 weeks to discuss current client needs, including dental needs of client  Norva Riffle.Arshia Rondon MSW, LCSW Licensed Clinical Social Worker [facility] [phone]

## 2019-07-22 ENCOUNTER — Telehealth: Payer: Self-pay | Admitting: *Deleted

## 2019-07-22 ENCOUNTER — Ambulatory Visit: Payer: Self-pay | Admitting: *Deleted

## 2019-07-22 NOTE — Telephone Encounter (Signed)
07/22/2019 Spoke with Mr Reddy by telephone at [facility] request. He was prescribe Zanaflex on 06/30/19 and they did help with pain/muscle spasms. He ran out yesterday and is complaining of increased pain/spasms today. He is requesting a refill.   Please advise and have clinical staff update patient.   [name], BSN, RN-BC Embedded Chronic Care Manager [facility] [place] [facility] Direct Dial: [phone]

## 2019-07-23 ENCOUNTER — Other Ambulatory Visit: Payer: Self-pay | Admitting: Family Medicine

## 2019-07-23 ENCOUNTER — Ambulatory Visit: Payer: Self-pay | Admitting: Licensed Clinical Social Worker

## 2019-07-23 DIAGNOSIS — K089 Disorder of teeth and supporting structures, unspecified: Secondary | ICD-10-CM

## 2019-07-23 DIAGNOSIS — F32A Depression, unspecified: Secondary | ICD-10-CM

## 2019-07-23 DIAGNOSIS — I10 Essential (primary) hypertension: Secondary | ICD-10-CM

## 2019-07-23 DIAGNOSIS — Z720 Tobacco use: Secondary | ICD-10-CM

## 2019-07-23 DIAGNOSIS — Z794 Long term (current) use of insulin: Secondary | ICD-10-CM

## 2019-07-23 DIAGNOSIS — F329 Major depressive disorder, single episode, unspecified: Secondary | ICD-10-CM

## 2019-07-23 DIAGNOSIS — J441 Chronic obstructive pulmonary disease with (acute) exacerbation: Secondary | ICD-10-CM

## 2019-07-23 DIAGNOSIS — E1142 Type 2 diabetes mellitus with diabetic polyneuropathy: Secondary | ICD-10-CM

## 2019-07-23 MED ORDER — TIZANIDINE HCL 4 MG PO TABS: 2.0000 mg | ORAL_TABLET | Freq: Three times a day (TID) | ORAL | 1 refills | Status: DC | PRN

## 2019-07-23 NOTE — Patient Instructions (Addendum)
Licensed Clinical Social Worker Visit Information  Materials Provided: No  LCSW spoke with client about current pain issues of client. He spoke of muscle spasm issues.  LCSW contacted [name] RNCM and updated RN, [name] regarding client symptoms.   Follow Up Plan:  LCSW to call client as scheduled to assess social work needs of client at that time  The patient verbalized understanding of instructions provided today and declined a print copy of patient instruction materials.   Norva Riffle.Annaliz Aven MSW, LCSW Licensed Clinical Social Worker [facility] [phone]

## 2019-07-23 NOTE — Telephone Encounter (Signed)
Refilled

## 2019-07-23 NOTE — Chronic Care Management (AMB) (Signed)
  Care Management Note   Dustin Cruz is a 53 y.o. year old male who is a primary care patient of Dustin Norlander, DO. The CM team was consulted for assistance with chronic disease management and care coordination.   I reached out to Dustin Cruz by phone today.   Review of patient status, including review of consultants reports, relevant laboratory and other test results, and collaboration with appropriate care team members and the patient's provider was performed as part of comprehensive patient evaluation and provision of chronic care management services.   Social Determinants of Health: risk for tobacco use; risk for stress; risk for food insecurity    Office Visit from 06/10/2019 in [place]  PHQ-9 Total Score  0     GAD 7 : Generalized Anxiety Score 11/22/2018  Nervous, Anxious, on Edge 0  Control/stop worrying 0  Worry too much - different things 0  Trouble relaxing 0  Restless 0  Easily annoyed or irritable 0  Afraid - awful might happen 0  Total GAD 7 Score 0  Anxiety Difficulty Not difficult at all   Medications   New medications from outside sources are available for reconciliation   acetaminophen (TYLENOL) 325 MG tablet    albuterol (PROVENTIL) (2.5 MG/3ML) 0.083% nebulizer solution    albuterol (VENTOLIN HFA) 108 (90 Base) MCG/ACT inhaler    apixaban (ELIQUIS) 5 MG TABS tablet    aspirin EC 81 MG tablet    atorvastatin (LIPITOR) 80 MG tablet    busPIRone (BUSPAR) 7.5 MG tablet    fenofibrate (TRICOR) 145 MG tablet    gabapentin (NEURONTIN) 600 MG tablet    insulin aspart (NOVOLOG FLEXPEN) 100 UNIT/ML FlexPen    Insulin Glargine (LANTUS SOLOSTAR) 100 UNIT/ML Solostar Pen    lisinopril (ZESTRIL) 5 MG tablet    metFORMIN (GLUCOPHAGE) 1000 MG tablet    nitroGLYCERIN (NITROSTAT) 0.4 MG SL tablet    omeprazole (PRILOSEC) 40 MG capsule    sertraline (ZOLOFT) 100 MG tablet    tiZANidine (ZANAFLEX) 4 MG tablet    traZODone (DESYREL) 150 MG  tablet    LCSW spoke with client about current pain issues of client. He spoke of muscle spasm issues.  LCSW contacted Dustin Cruz RNCM and updated RN, Dustin Cruz regarding client symptoms.   Follow Up Plan: LCSW to call client as scheduled to assess social work needs of client  Dustin Cruz.Dustin Cruz MSW, LCSW Licensed Clinical Social Worker [facility] [phone]

## 2019-08-11 ENCOUNTER — Ambulatory Visit: Payer: Self-pay | Admitting: Licensed Clinical Social Worker

## 2019-08-11 DIAGNOSIS — F329 Major depressive disorder, single episode, unspecified: Secondary | ICD-10-CM

## 2019-08-11 DIAGNOSIS — F32A Depression, unspecified: Secondary | ICD-10-CM

## 2019-08-11 DIAGNOSIS — Z720 Tobacco use: Secondary | ICD-10-CM

## 2019-08-11 DIAGNOSIS — I10 Essential (primary) hypertension: Secondary | ICD-10-CM

## 2019-08-11 DIAGNOSIS — E1142 Type 2 diabetes mellitus with diabetic polyneuropathy: Secondary | ICD-10-CM

## 2019-08-11 DIAGNOSIS — J441 Chronic obstructive pulmonary disease with (acute) exacerbation: Secondary | ICD-10-CM

## 2019-08-11 DIAGNOSIS — K089 Disorder of teeth and supporting structures, unspecified: Secondary | ICD-10-CM

## 2019-08-11 NOTE — Chronic Care Management (AMB) (Signed)
Care Management Note   Dustin Cruz is a 53 y.o. year old male who is a primary care patient of [name], DO. The CM team was consulted for assistance with chronic disease management and care coordination.   I reached out to [place] by phone today.   Review of patient status, including review of consultants reports, relevant laboratory and other test results, and collaboration with appropriate care team members and the patient's provider was performed as part of comprehensive patient evaluation and provision of chronic care management services.   Social determinants of health: risk of social isolation; risk of tobacco use; risk of financial strain; risk of stress; risk of food insecurity    Office Visit from 06/10/2019 in [place]  PHQ-9 Total Score  0      GAD 7 : Generalized Anxiety Score 11/22/2018  Nervous, Anxious, on Edge 0  Control/stop worrying 0  Worry too much - different things 0  Trouble relaxing 0  Restless 0  Easily annoyed or irritable 0  Afraid - awful might happen 0  Total GAD 7 Score 0  Anxiety Difficulty Not difficult at all    Medications   New medications from outside sources are available for reconciliation   acetaminophen (TYLENOL) 325 MG tablet    albuterol (PROVENTIL) (2.5 MG/3ML) 0.083% nebulizer solution    albuterol (VENTOLIN HFA) 108 (90 Base) MCG/ACT inhaler    apixaban (ELIQUIS) 5 MG TABS tablet    aspirin EC 81 MG tablet    atorvastatin (LIPITOR) 80 MG tablet    busPIRone (BUSPAR) 7.5 MG tablet    fenofibrate (TRICOR) 145 MG tablet    gabapentin (NEURONTIN) 600 MG tablet    insulin aspart (NOVOLOG FLEXPEN) 100 UNIT/ML FlexPen    Insulin Glargine (LANTUS SOLOSTAR) 100 UNIT/ML Solostar Pen    lisinopril (ZESTRIL) 5 MG tablet    metFORMIN (GLUCOPHAGE) 1000 MG tablet    nitroGLYCERIN (NITROSTAT) 0.4 MG SL tablet    omeprazole (PRILOSEC) 40 MG capsule    sertraline (ZOLOFT) 100 MG tablet    tiZANidine  (ZANAFLEX) 4 MG tablet    traZODone (DESYREL) 150 MG tablet    metoprolol tartrate (LOPRESSOR) 25 MG tablet    Goals    . Client said he had dental care issues and needs to see dentist for care (pt-stated)     Barriers:  Dental pain issues Financial Challenges  Clinical Social Work Goal: Over next 30 days , client  will work with LCSW to address concerns related to dental care needs of client  Interventions: LCSW encouraged client to communicate with RNCM regarding nursing needs of client Talked with client about current dental needs of client and dental resources for client in the area Talked with client about financial needs of client. Talked with client about pain issues of client (client spoke of pain issues in his legs and client said he is taking a muscle relaxer) Talked with client about transport needs of client Provided counseling support for client  Patient Self Care Activities: Self administers medications as prescribed Attends all scheduled provider appointments Performs ADL's independently  Plan: Client to call LCSW as needed to discuss psychosocial needs of client LCSW to call client in next 3 weeks to discuss current client needs including dental needs of client Client to communicate with RN CM to discuss nursing needs of client.  Client to call dentist, Dr. Costella Hatcher , at [phone], to schedule client dental appointment  Follow Up Plan: LCSW to call client in the next 3 weeks to talk with client about his current social work needs, including the dental needs of client at that time  Norva Riffle.Aurelio Mccamy MSW, LCSW Licensed Clinical Social Worker [facility] [phone]

## 2019-08-11 NOTE — Patient Instructions (Addendum)
Licensed Clinical Social Worker Visit Information  Goals we discussed today:  Goals    . Client said he had dental care issues and needs to see dentist for care (pt-stated)     Barriers:  Dental pain issues Financial Challenges  Clinical Social Work Goal: Over next 30 days , client  will work with LCSW to address concerns related to dental care needs of client  Interventions: LCSW encouraged client to communicate with RNCM regarding nursing needs of client Talked with client about current dental needs of client and dental resources for client in the area Talked with client about financial needs of client. Talked with client about pain issues of client (client spoke of pain issues in his legs and client said he is taking a muscle relaxer) Talked with client about transport needs of client Provided counseling support for client  Patient Self Care Activities: Self administers medications as prescribed Attends all scheduled provider appointments Performs ADL's independently  Plan: Client to call LCSW as needed to discuss psychosocial needs of client LCSW to call client in next 3 weeks to discuss current client needs including dental needs of client Client to communicate with RN CM to discuss nursing needs of client.            Materials Provided: No   Follow Up Plan: LCSW to call client in next 3 weeks to discuss current client needs including dental needs of client  The patient verbalized understanding of instructions provided today and declined a print copy of patient instruction materials.   Norva Riffle.Gwynn Chalker MSW, LCSW Licensed Clinical Social Worker [facility] [phone]

## 2019-08-18 ENCOUNTER — Telehealth: Payer: Self-pay | Admitting: Family Medicine

## 2019-08-18 NOTE — Telephone Encounter (Signed)
I haven't reached out to him this week but I will schedule a call for tomorrow.   [name], BSN, RN-BC Embedded Chronic Care Manager [facility] [place] [facility] Direct Dial: [phone]

## 2019-08-20 ENCOUNTER — Other Ambulatory Visit: Payer: Self-pay | Admitting: Family Medicine

## 2019-08-21 ENCOUNTER — Ambulatory Visit: Payer: Self-pay | Admitting: Licensed Clinical Social Worker

## 2019-08-21 ENCOUNTER — Ambulatory Visit: Payer: Medicare Other | Admitting: Family Medicine

## 2019-08-21 DIAGNOSIS — F32A Depression, unspecified: Secondary | ICD-10-CM

## 2019-08-21 DIAGNOSIS — E1142 Type 2 diabetes mellitus with diabetic polyneuropathy: Secondary | ICD-10-CM

## 2019-08-21 DIAGNOSIS — J441 Chronic obstructive pulmonary disease with (acute) exacerbation: Secondary | ICD-10-CM

## 2019-08-21 DIAGNOSIS — I10 Essential (primary) hypertension: Secondary | ICD-10-CM

## 2019-08-21 DIAGNOSIS — Z720 Tobacco use: Secondary | ICD-10-CM

## 2019-08-21 DIAGNOSIS — F329 Major depressive disorder, single episode, unspecified: Secondary | ICD-10-CM

## 2019-08-21 DIAGNOSIS — Z794 Long term (current) use of insulin: Secondary | ICD-10-CM

## 2019-08-21 DIAGNOSIS — K089 Disorder of teeth and supporting structures, unspecified: Secondary | ICD-10-CM

## 2019-08-21 NOTE — Chronic Care Management (AMB) (Signed)
Care Management Note   Dustin Cruz is a 53 y.o. year old male who is a primary care patient of Dustin Norlander, DO. The CM team was consulted for assistance with chronic disease management and care coordination.   I reached out to Dustin Cruz by phone today.     Review of patient status, including review of consultants reports, relevant laboratory and other test results, and collaboration with appropriate care team members and the patient's provider was performed as part of comprehensive patient evaluation and provision of chronic care management services.   Social determinants of health: risk of social isolation; risk of tobacco use; risk of financial strain; risk of stress; risk of food insecurity    Office Visit from 06/10/2019 in [place]  PHQ-9 Total Score  0     GAD 7 : Generalized Anxiety Score 11/22/2018  Nervous, Anxious, on Edge 0  Control/stop worrying 0  Worry too much - different things 0  Trouble relaxing 0  Restless 0  Easily annoyed or irritable 0  Afraid - awful might happen 0  Total GAD 7 Score 0  Anxiety Difficulty Not difficult at all   Medications   New medications from outside sources are available for reconciliation   acetaminophen (TYLENOL) 325 MG tablet    albuterol (PROVENTIL) (2.5 MG/3ML) 0.083% nebulizer solution    albuterol (VENTOLIN HFA) 108 (90 Base) MCG/ACT inhaler    apixaban (ELIQUIS) 5 MG TABS tablet    aspirin EC 81 MG tablet    atorvastatin (LIPITOR) 80 MG tablet    busPIRone (BUSPAR) 7.5 MG tablet    fenofibrate (TRICOR) 145 MG tablet    gabapentin (NEURONTIN) 600 MG tablet    insulin aspart (NOVOLOG FLEXPEN) 100 UNIT/ML FlexPen    Insulin Glargine (LANTUS SOLOSTAR) 100 UNIT/ML Solostar Pen    lisinopril (ZESTRIL) 5 MG tablet    metFORMIN (GLUCOPHAGE) 1000 MG tablet    nitroGLYCERIN (NITROSTAT) 0.4 MG SL tablet    omeprazole (PRILOSEC) 40 MG capsule    sertraline (ZOLOFT) 100 MG tablet    tiZANidine  (ZANAFLEX) 4 MG tablet    traZODone (DESYREL) 150 MG tablet    metoprolol tartrate (LOPRESSOR) 25 MG tablet    Goals        . Client said he had dental care issues and needs to see dentist for care (pt-stated)     Barriers:  Dental pain issues Financial Challenges  Clinical Social Work Goal: Over next 30 days , client  will work with LCSW to address concerns related to dental care needs of client  Interventions: LCSW previously encouraged client to communicate with RNCM regarding nursing needs of client Talked with client about current dental needs of client. Talked with client about financial needs of client. Talked with client about housing needs of client Talked with client about transport needs of client  Patient Self Care Activities: Self administers medications as prescribed Attends all scheduled provider appointments Performs ADL's independently  Plan: Client to call LCSW as needed to discuss psychosocial needs of client LCSW to call client in next 3 weeks to discuss current client needs including dental needs of client Client to communicate with RN CM to discuss nursing needs of client.          Follow Up Plan: LCSW to call client in next 3 weeks to discuss current client needs, including dental needs of client  Dustin Cruz.Dustin Cruz MSW, LCSW Licensed Clinical Social Worker [facility]  [phone] 

## 2019-08-21 NOTE — Patient Instructions (Signed)
Licensed Clinical Social Worker Visit Information  Goals we discussed today:   .         [name] Client said he had dental care issues and needs to see dentist for care (pt-stated)     Barriers:  Dental pain issues Financial Challenges  Clinical Social Work Goal: Over next 30 days , client  will work with LCSW to address concerns related to dental care needs of client  Interventions: LCSW previously encouraged client to communicate with RNCM regarding nursing needs of client Talked with client about current dental needs of client  Talked with client about financial needs of client. Talked with client about pain issues of client Talked with client about housing needs of client Talked with client about transport needs of client  Patient Self Care Activities: Self administers medications as prescribed Attends all scheduled provider appointments Performs ADL's independently  Plan: Client to call LCSW as needed to discuss psychosocial needs of client LCSW to call client in next 3 weeks to discuss current client needs including dental needs of client Client to communicate with RN CM to discuss nursing needs of client.         Materials Provided: No  Follow Up Plan: LCSW to call client in the next 3 weeks to discuss current client needs, including dental needs of client  The patient verbalized understanding of instructions provided today and declined a print copy of patient instruction materials.   Norva Riffle.Annah Jasko MSW, LCSW Licensed Clinical Social Worker [facility] [phone]

## 2019-08-22 ENCOUNTER — Ambulatory Visit (INDEPENDENT_AMBULATORY_CARE_PROVIDER_SITE_OTHER): Payer: Medicare Other | Admitting: Licensed Clinical Social Worker

## 2019-08-22 DIAGNOSIS — F32A Depression, unspecified: Secondary | ICD-10-CM

## 2019-08-22 DIAGNOSIS — F329 Major depressive disorder, single episode, unspecified: Secondary | ICD-10-CM

## 2019-08-22 DIAGNOSIS — Z794 Long term (current) use of insulin: Secondary | ICD-10-CM

## 2019-08-22 DIAGNOSIS — I10 Essential (primary) hypertension: Secondary | ICD-10-CM

## 2019-08-22 DIAGNOSIS — K089 Disorder of teeth and supporting structures, unspecified: Secondary | ICD-10-CM

## 2019-08-22 DIAGNOSIS — J441 Chronic obstructive pulmonary disease with (acute) exacerbation: Secondary | ICD-10-CM

## 2019-08-22 DIAGNOSIS — Z720 Tobacco use: Secondary | ICD-10-CM

## 2019-08-22 DIAGNOSIS — E1142 Type 2 diabetes mellitus with diabetic polyneuropathy: Secondary | ICD-10-CM

## 2019-08-22 NOTE — Chronic Care Management (AMB) (Signed)
  Chronic Care Management    Clinical Social Work Note  08/22/2019 Name: Dustin Cruz MRN: [id number] DOB: 02/26/1966  LCSW met face to face on 08/22/2019 at [facility] with Dustin Cruz who is a 53 y.o. year old male primary care patient of [name], DO. [name]   Review of patient status, including review of consultants reports, other relevant assessments, and collaboration with appropriate care team members and the patient's provider was performed as part of comprehensive patient evaluation and provision of chronic care management services.    SDOH (Social Determinants of Health) risk of stress; risk of food insecurity; risk of tobacco use; risk financial strain; risk social isolation   Medications   New medications from outside sources are available for reconciliation   acetaminophen (TYLENOL) 325 MG tablet    albuterol (PROVENTIL) (2.5 MG/3ML) 0.083% nebulizer solution    albuterol (VENTOLIN HFA) 108 (90 Base) MCG/ACT inhaler    apixaban (ELIQUIS) 5 MG TABS tablet    aspirin EC 81 MG tablet    atorvastatin (LIPITOR) 80 MG tablet    busPIRone (BUSPAR) 7.5 MG tablet    fenofibrate (TRICOR) 145 MG tablet    gabapentin (NEURONTIN) 600 MG tablet    insulin aspart (NOVOLOG FLEXPEN) 100 UNIT/ML FlexPen    Insulin Glargine (LANTUS SOLOSTAR) 100 UNIT/ML Solostar Pen    lisinopril (ZESTRIL) 5 MG tablet    metFORMIN (GLUCOPHAGE) 1000 MG tablet    nitroGLYCERIN (NITROSTAT) 0.4 MG SL tablet    omeprazole (PRILOSEC) 40 MG capsule    sertraline (ZOLOFT) 100 MG tablet    tiZANidine (ZANAFLEX) 4 MG tablet    traZODone (DESYREL) 150 MG tablet    metoprolol tartrate (LOPRESSOR) 25 MG tablet    LCSW and Dustin Cruz completed client application to [employer] in [place], [place]. Dustin Cruz was appreciative of assistance and said he would take completed housing application to [employer] today to turn in this application for client housing  Follow Up Plan: LCSW to call  client as scheduled to assess social work needs of client  Dustin Cruz.Dustin Cruz MSW, LCSW Licensed Clinical Social Worker [facility] [phone]

## 2019-08-22 NOTE — Patient Instructions (Addendum)
Licensed Clinical Social Worker Visit Information  Materials Provided: No  LCSW met face to face on 08/22/2019 at [facility] with Dustin Cruz who is a 53 y.o. year old male primary care patient of [name], DO. Marlinda Mike and Maia Petties Pala completed client application to [facility] in [place], [place]. Dustin Cruz was appreciative of assistance and said he would take completed housing application to [employer] today to turn in this application for client housing   Follow Up Plan: LCSW to call client as scheduled to assess social work needs of client  The patient verbalized understanding of instructions provided today and declined a print copy of patient instruction materials.   Norva Riffle.Gauri Galvao MSW, LCSW Licensed Clinical Social Worker [facility] [phone]

## 2019-08-26 ENCOUNTER — Other Ambulatory Visit: Payer: Self-pay | Admitting: *Deleted

## 2019-08-26 ENCOUNTER — Ambulatory Visit (HOSPITAL_COMMUNITY): Payer: Medicare Other | Admitting: Hematology

## 2019-08-26 MED ORDER — GABAPENTIN 600 MG PO TABS: 600.0000 mg | ORAL_TABLET | Freq: Three times a day (TID) | ORAL | 0 refills | Status: DC

## 2019-09-15 ENCOUNTER — Other Ambulatory Visit: Payer: Self-pay

## 2019-09-15 ENCOUNTER — Ambulatory Visit (INDEPENDENT_AMBULATORY_CARE_PROVIDER_SITE_OTHER): Payer: Medicare Other | Admitting: Licensed Clinical Social Worker

## 2019-09-15 DIAGNOSIS — F32A Depression, unspecified: Secondary | ICD-10-CM

## 2019-09-15 DIAGNOSIS — Z794 Long term (current) use of insulin: Secondary | ICD-10-CM

## 2019-09-15 DIAGNOSIS — R059 Cough, unspecified: Secondary | ICD-10-CM

## 2019-09-15 DIAGNOSIS — Z720 Tobacco use: Secondary | ICD-10-CM

## 2019-09-15 DIAGNOSIS — J441 Chronic obstructive pulmonary disease with (acute) exacerbation: Secondary | ICD-10-CM | POA: Diagnosis not present

## 2019-09-15 DIAGNOSIS — F329 Major depressive disorder, single episode, unspecified: Secondary | ICD-10-CM

## 2019-09-15 DIAGNOSIS — I10 Essential (primary) hypertension: Secondary | ICD-10-CM | POA: Diagnosis not present

## 2019-09-15 DIAGNOSIS — R05 Cough: Secondary | ICD-10-CM

## 2019-09-15 DIAGNOSIS — E1142 Type 2 diabetes mellitus with diabetic polyneuropathy: Secondary | ICD-10-CM | POA: Diagnosis not present

## 2019-09-15 DIAGNOSIS — K089 Disorder of teeth and supporting structures, unspecified: Secondary | ICD-10-CM

## 2019-09-15 MED ORDER — TIZANIDINE HCL 4 MG PO TABS: ORAL_TABLET | ORAL | 0 refills | Status: DC

## 2019-09-15 MED ORDER — ALBUTEROL SULFATE HFA 108 (90 BASE) MCG/ACT IN AERS: 2.0000 | INHALATION_SPRAY | Freq: Four times a day (QID) | RESPIRATORY_TRACT | 2 refills | Status: DC | PRN

## 2019-09-15 NOTE — Patient Instructions (Addendum)
Licensed Clinical Social Worker Visit Information  Goals we discussed today:  Goals    . Client said he had dental care issues and needs to see dentist for care (pt-stated)     Barriers:  Dental pain issues Financial Challenges  Clinical Social Work Goal: Over next 30 days , client  will work with LCSW to address concerns related to dental care needs of client  Interventions: Talked with client about current dental needs of client and dental resources for client in the area Talked previously with client about financial needs of client. LCSWpreviouslyencouraged client to communicate with RNCM regarding nursing needs of client Talked with client about housing needs of client Talked with client about transport needs of client Collaborated with Triage Nurse, Mary Sella, at [facility] about client's nursing needs  Patient Self Care Activities: Self administers medications as prescribed Attends all scheduled provider appointments Performs ADL's independently  Plan: Client to call LCSW as needed to discuss psychosocial needs of client LCSW to call client in next 3 weeks to discuss current client needs including dental needs of client Client to communicate with RN CM to discuss nursing needs of client.             Materials Provided: No  Follow Up Plan: LCSW to call client in next 3 weeks to discuss current client needs Including dental needs of client  The patient verbalized understanding of instructions provided today and declined a print copy of patient instruction materials.   Norva Riffle.Dung Salinger MSW, LCSW Licensed Clinical Social Worker [facility] [phone]

## 2019-09-15 NOTE — Chronic Care Management (AMB) (Signed)
Care Management Note   Dustin Cruz is a 53 y.o. year old male who is a primary care patient of [name], DO. The CM team was consulted for assistance with chronic disease management and care coordination.   I reached out to [place] by phone today.     Review of patient status, including review of consultants reports, relevant laboratory and other test results, and collaboration with appropriate care team members and the patient's provider was performed as part of comprehensive patient evaluation and provision of chronic care management services.   Social determinants of health: risk of social isolation; risk of tobacco use; risk of financial strain; risk of stress; risk of food insecurity    Office Visit from 06/10/2019 in [place]  PHQ-9 Total Score  0     GAD 7 : Generalized Anxiety Score 11/22/2018  Nervous, Anxious, on Edge 0  Control/stop worrying 0  Worry too much - different things 0  Trouble relaxing 0  Restless 0  Easily annoyed or irritable 0  Afraid - awful might happen 0  Total GAD 7 Score 0  Anxiety Difficulty Not difficult at all   Medications   New medications from outside sources are available for reconciliation   acetaminophen (TYLENOL) 325 MG tablet    albuterol (PROVENTIL) (2.5 MG/3ML) 0.083% nebulizer solution    albuterol (VENTOLIN HFA) 108 (90 Base) MCG/ACT inhaler    apixaban (ELIQUIS) 5 MG TABS tablet    aspirin EC 81 MG tablet    atorvastatin (LIPITOR) 80 MG tablet    busPIRone (BUSPAR) 7.5 MG tablet    fenofibrate (TRICOR) 145 MG tablet    gabapentin (NEURONTIN) 600 MG tablet    insulin aspart (NOVOLOG FLEXPEN) 100 UNIT/ML FlexPen    Insulin Glargine (LANTUS SOLOSTAR) 100 UNIT/ML Solostar Pen    lisinopril (ZESTRIL) 5 MG tablet    metFORMIN (GLUCOPHAGE) 1000 MG tablet    nitroGLYCERIN (NITROSTAT) 0.4 MG SL tablet    omeprazole (PRILOSEC) 40 MG capsule    sertraline (ZOLOFT) 100 MG tablet    tiZANidine  (ZANAFLEX) 4 MG tablet    traZODone (DESYREL) 150 MG tablet    metoprolol tartrate (LOPRESSOR) 25 MG tablet    Goals    . Client said he had dental care issues and needs to see dentist for care (pt-stated)     Barriers:  Dental pain issues of client with chronic diagnoses of HTN, Type 2 DM, Depression Financial Challenges  Clinical Social Work Goal: Over next 30 days , client  will work with LCSW to address concerns related to dental care needs of client  Interventions:   Talked with client about current dental needs of client and dental resources for client in the area Talked previously with client about financial needs of client. LCSW previously encouraged client to communicate with RNCM regarding nursing needs of client Talked with client about housing needs of client Talked with client about transport needs of client Collaborated with Triage Nurse, Mary Sella, at [facility] about client's nursing needs  Patient Self Care Activities: Self administers medications as prescribed Attends all scheduled provider appointments Performs ADL's independently  Plan: Client to call LCSW as needed to discuss psychosocial needs of client LCSW to call client in next 3 weeks to discuss current client needs including dental needs of client Client to communicate with RN CM to discuss nursing needs of client.  Client to call dentist, Dr. Costella Hatcher , at [phone], to schedule client dental appointment  Follow Up Plan: LCSW to call client in next 3 weeks to discuss current client needs, including dental needs of client  Norva Riffle.Handsome Anglin MSW, LCSW Licensed Clinical Social Worker [facility] [phone]

## 2019-09-22 ENCOUNTER — Other Ambulatory Visit: Payer: Self-pay

## 2019-09-23 ENCOUNTER — Ambulatory Visit: Payer: Medicare Other | Admitting: Family Medicine

## 2019-09-25 ENCOUNTER — Encounter: Payer: Self-pay | Admitting: Family Medicine

## 2019-10-01 ENCOUNTER — Telehealth: Payer: Self-pay

## 2019-10-01 ENCOUNTER — Ambulatory Visit: Payer: Self-pay | Admitting: Licensed Clinical Social Worker

## 2019-10-01 DIAGNOSIS — I10 Essential (primary) hypertension: Secondary | ICD-10-CM

## 2019-10-01 DIAGNOSIS — F32A Depression, unspecified: Secondary | ICD-10-CM

## 2019-10-01 DIAGNOSIS — K219 Gastro-esophageal reflux disease without esophagitis: Secondary | ICD-10-CM

## 2019-10-01 DIAGNOSIS — F329 Major depressive disorder, single episode, unspecified: Secondary | ICD-10-CM

## 2019-10-01 DIAGNOSIS — E785 Hyperlipidemia, unspecified: Secondary | ICD-10-CM

## 2019-10-01 DIAGNOSIS — E1142 Type 2 diabetes mellitus with diabetic polyneuropathy: Secondary | ICD-10-CM

## 2019-10-01 DIAGNOSIS — Z794 Long term (current) use of insulin: Secondary | ICD-10-CM

## 2019-10-01 NOTE — Chronic Care Management (AMB) (Signed)
  Care Cruz Note   Dustin Cruz is a 53 y.o. year old male who is a primary care patient of Dustin Norlander, DO. The CM team was consulted for assistance with chronic disease Cruz and care coordination.   I reached out to Dustin Cruz by phone today.   Review of patient status, including review of consultants reports, relevant laboratory and other test results, and collaboration with appropriate care team members and the patient's provider was performed as part of comprehensive patient evaluation and provision of chronic care Cruz services.   Social determinants of health: risk of tobacco use; risk of depression; risk of housing needs    Office Visit from 06/10/2019 in [place]  PHQ-9 Total Score  0     GAD 7 : Generalized Anxiety Score 11/22/2018  Nervous, Anxious, on Edge 0  Control/stop worrying 0  Worry too much - different things 0  Trouble relaxing 0  Restless 0  Easily annoyed or irritable 0  Afraid - awful might happen 0  Total GAD 7 Score 0  Anxiety Difficulty Not difficult at all   Medications   (very important)  New medications from outside sources are available for reconciliation  acetaminophen (TYLENOL) 325 MG tablet albuterol (PROVENTIL) (2.5 MG/3ML) 0.083% nebulizer solution albuterol (VENTOLIN HFA) 108 (90 Base) MCG/ACT inhaler apixaban (ELIQUIS) 5 MG TABS tablet aspirin EC 81 MG tablet atorvastatin (LIPITOR) 80 MG tablet busPIRone (BUSPAR) 7.5 MG tablet fenofibrate (TRICOR) 145 MG tablet gabapentin (NEURONTIN) 600 MG tablet insulin aspart (NOVOLOG FLEXPEN) 100 UNIT/ML FlexPen Insulin Glargine (LANTUS SOLOSTAR) 100 UNIT/ML Solostar Pen lisinopril (ZESTRIL) 5 MG tablet metFORMIN (GLUCOPHAGE) 1000 MG tablet nitroGLYCERIN (NITROSTAT) 0.4 MG SL tablet omeprazole (PRILOSEC) 40 MG capsule sertraline (ZOLOFT) 100 MG tablet tiZANidine (ZANAFLEX) 4 MG tablet traZODone (DESYREL) 150 MG tablet  Goals Addressed              This Visit's Progress   . Client said he had dental care issues and needs to see dentist for care (pt-stated)       Barriers:  Dental pain issues in patient with Chronic Diagnoses of HTN, Depression, GERD, DM Type 2, and HLD  Financial Challenges  Clinical Social Work Goal: Over next 30 days , client  will work with LCSW to address concerns related to dental care needs of client  Interventions: Talked with client about financial needs of client. Talked with client about client's upcoming appointments Talked with Dustin Cruz about housing options for client (he has placed a apartment application with apartment complex) Talked with client about dental needs of client Provided counseling support for client Talked with client about sleeping challenges of client Encouraged Dustin Cruz to use [facility] to assist with medical transport needs of client Collaborated with Dustin Cruz Triage Nurse, Dustin Cruz, to discuss nursing needs of client   Patient Self Care Activities: Self administers medications as prescribed Attends all scheduled provider appointments Performs ADL's independently  Plan: Client to call LCSW as needed to discuss psychosocial needs of client LCSW to call client in next 4 weeks to discuss current client needs including dental needs of client Client to communicate with RN CM to discuss nursing needs of client.           Follow Up Plan: LCSW to call client in next 4 weeks to discuss current client needs, including dental needs of client  Dustin Cruz.Dustin Cruz MSW, LCSW Licensed Clinical Social Worker Dustin Cruz/Dustin Cruz [phone]

## 2019-10-01 NOTE — Patient Instructions (Addendum)
Licensed Clinical Social Worker Visit Information  Goals we discussed today:  Goals Addressed            This Visit's Progress   . Client said he had dental care issues and needs to see dentist for care (pt-stated)       Barriers:  Dental pain issues in patient with Chronic Diagnoses of HTN, Depression, GERD, DM Type 2, and HLD  Financial Challenges  Clinical Social Work Goal: Over next 30 days , client  will work with LCSW to address concerns related to dental care needs of client  Interventions:  Talked with client about financial needs of client. Talked with client about client's upcoming appointments Talked with Vraj about housing options for client (he has placed a apartment application with apartment complex) Talked with client about dental needs of client Provided counseling support for client Talked with client about sleeping challenges of client Encouraged Cebert to use [facility] to assist with medical transport needs of client Collaborated with [facility] Triage Nurse, [name], to discuss nursing needs of client   Patient Self Care Activities: Self administers medications as prescribed Attends all scheduled provider appointments Performs ADL's independently  Plan: Client to call LCSW as needed to discuss psychosocial needs of client LCSW to call client in next 4 weeks to discuss current client needs including dental needs of client Client to communicate with RN CM to discuss nursing needs of client.         Materials Provided: No  Follow Up Plan: LCSW to call client in next 4 weeks to discuss current client needs including dental needs of client  The patient verbalized understanding of instructions provided today and declined a print copy of patient instruction materials.   Norva Riffle.Ashante Snelling MSW, LCSW Licensed Clinical Social Worker [facility] [phone]

## 2019-10-01 NOTE — Telephone Encounter (Signed)
Dustin Cruz, our clinical social worker, had a telephone visit with patient today.  The patient has an appointment with you on 10/29/2019.  The patient asked [name] to make you aware that he is having some problems with numbness in his feet, insomnia and depression.  He is still taking his Buspar and Zoloft.  Patient is aware that you are out of the office until next week and wanted to wait until you come back for these issues to be addressed. He reports he plans to speak with you about these problems at his upcoming office visit. Patient reported to [name] that he has no suicidal or homicidal ideations.

## 2019-10-07 ENCOUNTER — Other Ambulatory Visit: Payer: Self-pay | Admitting: Family Medicine

## 2019-10-13 ENCOUNTER — Telehealth: Payer: Self-pay | Admitting: *Deleted

## 2019-10-13 ENCOUNTER — Other Ambulatory Visit: Payer: Self-pay

## 2019-10-13 ENCOUNTER — Ambulatory Visit (INDEPENDENT_AMBULATORY_CARE_PROVIDER_SITE_OTHER): Payer: Medicare Other | Admitting: Licensed Clinical Social Worker

## 2019-10-13 ENCOUNTER — Other Ambulatory Visit: Payer: Self-pay | Admitting: Family Medicine

## 2019-10-13 ENCOUNTER — Ambulatory Visit: Payer: Medicare Other | Admitting: *Deleted

## 2019-10-13 ENCOUNTER — Ambulatory Visit (INDEPENDENT_AMBULATORY_CARE_PROVIDER_SITE_OTHER): Payer: Medicare Other

## 2019-10-13 DIAGNOSIS — K219 Gastro-esophageal reflux disease without esophagitis: Secondary | ICD-10-CM

## 2019-10-13 DIAGNOSIS — E1142 Type 2 diabetes mellitus with diabetic polyneuropathy: Secondary | ICD-10-CM

## 2019-10-13 DIAGNOSIS — F329 Major depressive disorder, single episode, unspecified: Secondary | ICD-10-CM

## 2019-10-13 DIAGNOSIS — E785 Hyperlipidemia, unspecified: Secondary | ICD-10-CM

## 2019-10-13 DIAGNOSIS — M7918 Myalgia, other site: Secondary | ICD-10-CM

## 2019-10-13 DIAGNOSIS — F32A Depression, unspecified: Secondary | ICD-10-CM

## 2019-10-13 DIAGNOSIS — I10 Essential (primary) hypertension: Secondary | ICD-10-CM

## 2019-10-13 DIAGNOSIS — Z23 Encounter for immunization: Secondary | ICD-10-CM | POA: Diagnosis not present

## 2019-10-13 MED ORDER — TIZANIDINE HCL 4 MG PO TABS: ORAL_TABLET | ORAL | 2 refills | Status: DC

## 2019-10-13 NOTE — Patient Instructions (Signed)
Visit Information  Goals Addressed            This Visit's Progress     Patient Stated   . Medication Refill (pt-stated)       Medication Refill request in patient with DM and chronic muscle spasms.   Current Barriers:  . Need for refill on Zanaflex  Nurse Case Manager Clinical Goal(s):  [name] Over the next 3 days, patient will have Zanaflex refilled if provider deems request appropriate.   Interventions:  . Consulted by LCSW . Chart reviewed . Mediations reviewed o Patient taking Zanaflex for skeletal muscle spasms o Has DM so unable to take NSAIDs longterm for pain . Refill request written and sent to Dr Lajuana Ripple for review  Patient Self Care Activities:  . Performs ADL's independently . Performs IADL's independently  Initial goal documentation      The care management team will reach out to the patient again over the next 30 days.   [name], BSN, RN-BC Embedded Chronic Care Manager [facility] Direct Dial: [phone]

## 2019-10-13 NOTE — Patient Instructions (Signed)
Licensed Clinical Social Worker Visit Information  Goals we discussed today:  Goals Addressed            This Visit's Progress   . Client said he had dental care issues and needs to see dentist for care (pt-stated)       Barriers:  Dental pain issues in patient with Chronic Diagnoses of HTN, Depression, GERD, DM Type 2, and HLD  Financial Challenges  Clinical Social Work Goal: Over next 30 days , client  will work with LCSW to address concerns related to dental care needs of client  Interventions: LCSW encouraged client to communicate with RNCM regarding nursing needs of client Talked with client about current dental needs of client and dental resources for client in the area Talked with client about financial needs of client. Talked with client about housing needs of client Talked with client about sleeping challenges of client Talked with client about pain issues of client Collaborated with RNCM to discuss nursing needs of client  Patient Self Care Activities: Self administers medications as prescribed Attends all scheduled provider appointments Performs ADL's independently  Plan: Client to call LCSW as needed to discuss psychosocial needs of client LCSW to call client in next 4 weeks to discuss current client needs including dental needs of client Client to communicate with RN CM to discuss nursing needs of client.          Materials Provided: No  Follow Up Plan: LCSW to call client in next 4 weeks to discuss current client needs including dental needs of client  The patient verbalized understanding of instructions provided today and declined a print copy of patient instruction materials.   Norva Riffle.Adalae Baysinger MSW, LCSW Licensed Clinical Social Worker [facility] [phone]

## 2019-10-13 NOTE — Telephone Encounter (Signed)
done

## 2019-10-13 NOTE — Chronic Care Management (AMB) (Signed)
  Care Management Note   Dustin Cruz is a 54 y.o. year old male who is a primary care patient of [name], DO. The CM team was consulted for assistance with chronic disease management and care coordination.   I reached out to [place] by phone today.    Review of patient status, including review of consultants reports, relevant laboratory and other test results, and collaboration with appropriate care team members and the patient's provider was performed as part of comprehensive patient evaluation and provision of chronic care management services.   Social determinants of health: risk of tobacco use; risk of depression; risk of housing needs    Office Visit from 06/10/2019 in [place]  PHQ-9 Total Score  0     GAD 7 : Generalized Anxiety Score 11/22/2018  Nervous, Anxious, on Edge 0  Control/stop worrying 0  Worry too much - different things 0  Trouble relaxing 0  Restless 0  Easily annoyed or irritable 0  Afraid - awful might happen 0  Total GAD 7 Score 0  Anxiety Difficulty Not difficult at all   Medications   acetaminophen (TYLENOL) 325 MG tablet albuterol (PROVENTIL) (2.5 MG/3ML) 0.083% nebulizer solution albuterol (VENTOLIN HFA) 108 (90 Base) MCG/ACT inhaler apixaban (ELIQUIS) 5 MG TABS tablet aspirin EC 81 MG tablet atorvastatin (LIPITOR) 80 MG tablet busPIRone (BUSPAR) 7.5 MG tablet fenofibrate (TRICOR) 145 MG tablet gabapentin (NEURONTIN) 600 MG tablet insulin aspart (NOVOLOG FLEXPEN) 100 UNIT/ML FlexPen Insulin Glargine (LANTUS SOLOSTAR) 100 UNIT/ML Solostar Pen lisinopril (ZESTRIL) 5 MG tablet metFORMIN (GLUCOPHAGE) 1000 MG tablet nitroGLYCERIN (NITROSTAT) 0.4 MG SL tablet omeprazole (PRILOSEC) 40 MG capsule sertraline (ZOLOFT) 100 MG tablet tiZANidine (ZANAFLEX) 4 MG tablet traZODone (DESYREL) 150 MG tablet  Goals Addressed            This Visit's Progress   . Client said he had dental care issues and needs to see  dentist for care (pt-stated)       Barriers:  Dental pain issues in patient with Chronic Diagnoses of HTN, Depression, GERD, DM Type 2, and HLD  Financial Challenges  Clinical Social Work Goal: Over next 30 days , client  will work with LCSW to address concerns related to dental care needs of client  Interventions: LCSW encouraged client to communicate with RNCM regarding nursing needs of client Talked with client about current dental needs of client and dental resources for client in the area Talked with client about financial needs of client. Talked with client about housing needs of client Talked with client about sleeping challenges of client Talked with client about pain issues of client Collaborated with RNCM to discuss nursing needs of client  Patient Self Care Activities: Self administers medications as prescribed Attends all scheduled provider appointments Performs ADL's independently  Plan: Client to call LCSW as needed to discuss psychosocial needs of client LCSW to call client in next 4 weeks to discuss current client needs including dental needs of client Client to communicate with RN CM to discuss nursing needs of client.        Follow Up Plan: LCSW to call client in next 4 weeks to discuss the current needs of client including the dental needs of client at that time  Dustin Cruz MSW, LCSW Licensed Clinical Social Worker [facility] [phone]

## 2019-10-13 NOTE — Telephone Encounter (Signed)
Patient requesting refill on Zanaflex to [facility] in [place].

## 2019-10-13 NOTE — Chronic Care Management (AMB) (Signed)
Chronic Care Management   Care Coordination   10/13/2019 Name: Dustin Cruz MRN: [id number] DOB: 1965-11-20  Referred by: [name], DO Reason for referral : Chronic Care Management (Care Coordination)   Dustin Cruz is a 54 y.o. year old male who is a primary care patient of [name], DO. The CCM team was consulted for assistance with chronic disease management and care coordination needs.    Review of patient status, including review of consultants reports, relevant laboratory and other test results, and collaboration with appropriate care team members and the patient's provider was performed as part of comprehensive patient evaluation and provision of chronic care management services.  I was consulted by Theadore Nan, LCSW after he spoke with Mr Dustin Cruz this morning. Mr Ramus is out of Zanaflex and is requesting a refill from his PCP.     Outpatient Encounter Medications as of 10/13/2019  Medication Sig  . acetaminophen (TYLENOL) 325 MG tablet Take 2 tablets (650 mg total) by mouth every 6 (six) hours as needed for mild pain, moderate pain or fever (or Fever >/= 101).  [name] albuterol (PROVENTIL) (2.5 MG/3ML) 0.083% nebulizer solution Take 3 mLs (2.5 mg total) by nebulization every 6 (six) hours as needed for wheezing or shortness of breath. Dx---44.1  . albuterol (VENTOLIN HFA) 108 (90 Base) MCG/ACT inhaler Inhale 2 puffs into the lungs every 6 (six) hours as needed for wheezing. Dx--44.1  . apixaban (ELIQUIS) 5 MG TABS tablet Take 1 tablet (5 mg total) by mouth 2 (two) times daily.  [name] aspirin EC 81 MG tablet Take 1 tablet (81 mg total) by mouth daily with breakfast.  . atorvastatin (LIPITOR) 80 MG tablet Take 1 tablet (80 mg total) by mouth daily.  . busPIRone (BUSPAR) 7.5 MG tablet TAKE 1 TABLET BY MOUTH TWICE DAILY  . fenofibrate (TRICOR) 145 MG tablet Take 1 tablet (145 mg total) by mouth daily.  [name] gabapentin (NEURONTIN) 600 MG tablet Take 1 tablet (600 mg total) by  mouth 3 (three) times daily.  . insulin aspart (NOVOLOG FLEXPEN) 100 UNIT/ML FlexPen Inject 10 Units into the skin 3 (three) times daily with meals. Inject up to 10 units prior to meal per sliding scale.  . Insulin Glargine (LANTUS SOLOSTAR) 100 UNIT/ML Solostar Pen Inject 60 Units into the skin every evening.  [name] lisinopril (ZESTRIL) 5 MG tablet Take 1 tablet (5 mg total) by mouth at bedtime.  . metFORMIN (GLUCOPHAGE) 1000 MG tablet Take 1 tablet (1,000 mg total) by mouth 2 (two) times daily with a meal.  . nitroGLYCERIN (NITROSTAT) 0.4 MG SL tablet Place 1 tablet (0.4 mg total) under the tongue every 5 (five) minutes as needed. For chest pain.  [name] omeprazole (PRILOSEC) 40 MG capsule TAKE 1 CAPSULE BY MOUTH TWICE DAILY  . sertraline (ZOLOFT) 100 MG tablet Take 1 tablet (100 mg total) by mouth daily.  [name] tiZANidine (ZANAFLEX) 4 MG tablet TAKE 1/2 TO 1 TABLET(2 TO 4 MG) BY MOUTH EVERY 8 HOURS AS NEEDED FOR MUSCLE SPASMS  . traZODone (DESYREL) 150 MG tablet TAKE 1 TABLET BY MOUTH AT BEDTIME  . [DISCONTINUED] metoprolol tartrate (LOPRESSOR) 25 MG tablet Take 25 mg by mouth 2 (two) times daily.     No facility-administered encounter medications on file as of 10/13/2019.    RN Care Plan   . Medication Refill (pt-stated)       Medication Refill request in patient with DM and chronic muscle spasms.   Current Barriers:  .  Need for refill on Zanaflex  Nurse Case Manager Clinical Goal(s):  [name] Over the next 3 days, patient will have Zanaflex refilled if provider deems request appropriate.   Interventions:  . Consulted by LCSW . Chart reviewed . Mediations reviewed o Patient taking Zanaflex for skeletal muscle spasms o Has DM so unable to take NSAIDs longterm for pain . Refill request written and sent to Dr Lajuana Ripple for review  Patient Self Care Activities:  . Performs ADL's independently . Performs IADL's independently  Initial goal documentation         Follow-up Plan The care management  team will reach out to the patient again over the next 30 days.   [name], BSN, RN-BC Embedded Chronic Care Manager [facility] Direct Dial: [phone]

## 2019-10-14 NOTE — Addendum Note (Signed)
Addended by: Clerance Lav on: 10/14/2019 04:15 PM   Modules accepted: Orders

## 2019-10-17 ENCOUNTER — Telehealth: Payer: Self-pay

## 2019-10-17 NOTE — Telephone Encounter (Signed)
Copied from [place] [id number]. Topic: Referral - Status >> Oct 17, 2019  [id number] PM Simone Curia D wrote: [id number] Spoke with patient about transportation and housing.  He stated that he had just purchased a new car and had transportation but still needed information about Medicaid transportation as a back up. Emailed information to be mailed out to [employer], Glass blower/designer at [employer].  Ambrose Mantle [phone]

## 2019-10-27 ENCOUNTER — Telehealth: Payer: Self-pay

## 2019-10-28 ENCOUNTER — Other Ambulatory Visit: Payer: Self-pay

## 2019-10-29 ENCOUNTER — Ambulatory Visit (INDEPENDENT_AMBULATORY_CARE_PROVIDER_SITE_OTHER): Payer: Medicare Other | Admitting: Family Medicine

## 2019-10-29 ENCOUNTER — Telehealth: Payer: Self-pay | Admitting: Family Medicine

## 2019-10-29 VITALS — BP 132/75 | HR 104 | Temp 97.3°F | Ht 74.0 in | Wt 195.0 lb

## 2019-10-29 DIAGNOSIS — Z794 Long term (current) use of insulin: Secondary | ICD-10-CM | POA: Diagnosis not present

## 2019-10-29 DIAGNOSIS — E1142 Type 2 diabetes mellitus with diabetic polyneuropathy: Secondary | ICD-10-CM | POA: Diagnosis not present

## 2019-10-29 DIAGNOSIS — L84 Corns and callosities: Secondary | ICD-10-CM

## 2019-10-29 LAB — BAYER DCA HB A1C WAIVED: HB A1C (BAYER DCA - WAIVED): >14 % — ABNORMAL HIGH (ref <7.0)

## 2019-10-29 NOTE — Patient Instructions (Addendum)
Your sugar is very poorly controlled.  I am putting in a referral to the endocrinologist to help with your diabetes.   Diabetes Mellitus and Nutrition, Adult When you have diabetes (diabetes mellitus), it is very important to have healthy eating habits because your blood sugar (glucose) levels are greatly affected by what you eat and drink. Eating healthy foods in the appropriate amounts, at about the same times every day, can help you:  Control your blood glucose.  Lower your risk of heart disease.  Improve your blood pressure.  Reach or maintain a healthy weight. Every person with diabetes is different, and each person has different needs for a meal plan. Your health care provider may recommend that you work with a diet and nutrition specialist (dietitian) to make a meal plan that is best for you. Your meal plan may vary depending on factors such as:  The calories you need.  The medicines you take.  Your weight.  Your blood glucose, blood pressure, and cholesterol levels.  Your activity level.  Other health conditions you have, such as heart or kidney disease. How do carbohydrates affect me? Carbohydrates, also called carbs, affect your blood glucose level more than any other type of food. Eating carbs naturally raises the amount of glucose in your blood. Carb counting is a method for keeping track of how many carbs you eat. Counting carbs is important to keep your blood glucose at a healthy level, especially if you use insulin or take certain oral diabetes medicines. It is important to know how many carbs you can safely have in each meal. This is different for every person. Your dietitian can help you calculate how many carbs you should have at each meal and for each snack. Foods that contain carbs include:  Bread, cereal, rice, pasta, and crackers.  Potatoes and corn.  Peas, beans, and lentils.  Milk and yogurt.  Fruit and juice.  Desserts, such as cakes, cookies, ice  cream, and candy. How does alcohol affect me? Alcohol can cause a sudden decrease in blood glucose (hypoglycemia), especially if you use insulin or take certain oral diabetes medicines. Hypoglycemia can be a life-threatening condition. Symptoms of hypoglycemia (sleepiness, dizziness, and confusion) are similar to symptoms of having too much alcohol. If your health care provider says that alcohol is safe for you, follow these guidelines:  Limit alcohol intake to no more than 1 drink per day for nonpregnant women and 2 drinks per day for men. One drink equals 12 oz of beer, 5 oz of wine, or 1 oz of hard liquor.  Do not drink on an empty stomach.  Keep yourself hydrated with water, diet soda, or unsweetened iced tea.  Keep in mind that regular soda, juice, and other mixers may contain a lot of sugar and must be counted as carbs. What are tips for following this plan?  Reading food labels  Start by checking the serving size on the "Nutrition Facts" label of packaged foods and drinks. The amount of calories, carbs, fats, and other nutrients listed on the label is based on one serving of the item. Many items contain more than one serving per package.  Check the total grams (g) of carbs in one serving. You can calculate the number of servings of carbs in one serving by dividing the total carbs by 15. For example, if a food has 30 g of total carbs, it would be equal to 2 servings of carbs.  Check the number of grams (g) of  saturated and trans fats in one serving. Choose foods that have low or no amount of these fats.  Check the number of milligrams (mg) of salt (sodium) in one serving. Most people should limit total sodium intake to less than 2,300 mg per day.  Always check the nutrition information of foods labeled as "low-fat" or "nonfat". These foods may be higher in added sugar or refined carbs and should be avoided.  Talk to your dietitian to identify your daily goals for nutrients listed on  the label. Shopping  Avoid buying canned, premade, or processed foods. These foods tend to be high in fat, sodium, and added sugar.  Shop around the outside edge of the grocery store. This includes fresh fruits and vegetables, bulk grains, fresh meats, and fresh dairy. Cooking  Use low-heat cooking methods, such as baking, instead of high-heat cooking methods like deep frying.  Cook using healthy oils, such as olive, canola, or sunflower oil.  Avoid cooking with butter, cream, or high-fat meats. Meal planning  Eat meals and snacks regularly, preferably at the same times every day. Avoid going long periods of time without eating.  Eat foods high in fiber, such as fresh fruits, vegetables, beans, and whole grains. Talk to your dietitian about how many servings of carbs you can eat at each meal.  Eat 4-6 ounces (oz) of lean protein each day, such as lean meat, chicken, fish, eggs, or tofu. One oz of lean protein is equal to: ? 1 oz of meat, chicken, or fish. ? 1 egg. ?  cup of tofu.  Eat some foods each day that contain healthy fats, such as avocado, nuts, seeds, and fish. Lifestyle  Check your blood glucose regularly.  Exercise regularly as told by your health care provider. This may include: ? 150 minutes of moderate-intensity or vigorous-intensity exercise each week. This could be brisk walking, biking, or water aerobics. ? Stretching and doing strength exercises, such as yoga or weightlifting, at least 2 times a week.  Take medicines as told by your health care provider.  Do not use any products that contain nicotine or tobacco, such as cigarettes and e-cigarettes. If you need help quitting, ask your health care provider.  Work with a Social worker or diabetes educator to identify strategies to manage stress and any emotional and social challenges. Questions to ask a health care provider  Do I need to meet with a diabetes educator?  Do I need to meet with a dietitian?  What  number can I call if I have questions?  When are the best times to check my blood glucose? Where to find more information:  American Diabetes Association: diabetes.org  Academy of Nutrition and Dietetics: www.eatright.[employer] of Diabetes and Digestive and Kidney Diseases (NIH): [employer] Summary  A healthy meal plan will help you control your blood glucose and maintain a healthy lifestyle.  Working with a diet and nutrition specialist (dietitian) can help you make a meal plan that is best for you.  Keep in mind that carbohydrates (carbs) and alcohol have immediate effects on your blood glucose levels. It is important to count carbs and to use alcohol carefully. This information is not intended to replace advice given to you by your health care provider. Make sure you discuss any questions you have with your health care provider. Document Revised: 09/07/2017 Document Reviewed: 10/30/2016 Elsevier Patient Education  2020 [employer].

## 2019-10-29 NOTE — Telephone Encounter (Signed)
Changed pharmacy to [facility] in chart.

## 2019-10-29 NOTE — Progress Notes (Signed)
Subjective: CC: f/u DM2, wants diabetic shoes PCP: [name], DO [id number] D Dustin Cruz is a 54 y.o. male presenting to clinic today for:  1.  Uncontrolled type 2 diabetes w/ polyneuropathy Patient reports that he has been pretty much eating what he wants. He is taking his metformin twice daily as directed.  He is also taking his Lantus 70u and NovoLog 10u (1-2 times daily).  No chest pain, shortness of breath.  He has neuropathy in both feet but right is worse than left. No history of diabetic ulcer.  He does note his feet are often cold.  ROS: Per HPI  No Known Allergies Past Medical History:  Diagnosis Date  . Anxiety   . Asthma   . CKD stage 3 due to type 2 diabetes mellitus ([place])   . COPD (chronic obstructive pulmonary disease) ([place])   . Coronary artery disease    s/p BMS to Ramus 10/2010;  Cath 01/22/12 prox 30-40% LAD, LCx ramus w/ hazy 70-80% in-stent restenosis, EF 60% treated medically  . Coronary artery disease    s/p BMS to Ramus 10/2010;  Cath 01/22/12 prox 30-40% LAD, LCx ramus w/ hazy 70-80% in-stent restenosis, EF 60% treated medically   . Depression   . Diabetes mellitus    Type 2  . Gastroparesis 05/2013  . GERD (gastroesophageal reflux disease)   . HTN (hypertension)   . Hyperlipidemia   . Hyperlipidemia   . Major depression, chronic 08/13/2012  . Melanoma ([place]) 2007   surgery at [facility], Followed by [facility]  . Myocardial infarction ([place]) 2012  . Obesity   . Tobacco abuse   . Tubular adenoma   . Urinary retention     Current Outpatient Medications:  .  acetaminophen (TYLENOL) 325 MG tablet, Take 2 tablets (650 mg total) by mouth every 6 (six) hours as needed for mild pain, moderate pain or fever (or Fever >/= 101)., Disp: 30 tablet, Rfl: 2 .  albuterol (PROVENTIL) (2.5 MG/3ML) 0.083% nebulizer solution, Take 3 mLs (2.5 mg total) by nebulization every 6 (six) hours as needed for wheezing or shortness of breath. Dx---44.1, Disp: 540 mL, Rfl: 0 .   albuterol (VENTOLIN HFA) 108 (90 Base) MCG/ACT inhaler, Inhale 2 puffs into the lungs every 6 (six) hours as needed for wheezing. Dx--44.1, Disp: 18 g, Rfl: 2 .  apixaban (ELIQUIS) 5 MG TABS tablet, Take 1 tablet (5 mg total) by mouth 2 (two) times daily., Disp: 60 tablet, Rfl: 11 .  aspirin EC 81 MG tablet, Take 1 tablet (81 mg total) by mouth daily with breakfast., Disp: 30 tablet, Rfl: 5 .  atorvastatin (LIPITOR) 80 MG tablet, Take 1 tablet (80 mg total) by mouth daily., Disp: 90 tablet, Rfl: 0 .  busPIRone (BUSPAR) 7.5 MG tablet, TAKE 1 TABLET BY MOUTH TWICE DAILY, Disp: 60 tablet, Rfl: 2 .  fenofibrate (TRICOR) 145 MG tablet, Take 1 tablet (145 mg total) by mouth daily., Disp: 90 tablet, Rfl: 0 .  gabapentin (NEURONTIN) 600 MG tablet, Take 1 tablet (600 mg total) by mouth 3 (three) times daily., Disp: 270 tablet, Rfl: 0 .  insulin aspart (NOVOLOG FLEXPEN) 100 UNIT/ML FlexPen, Inject 10 Units into the skin 3 (three) times daily with meals. Inject up to 10 units prior to meal per sliding scale., Disp: 15 mL, Rfl: 2 .  Insulin Glargine (LANTUS SOLOSTAR) 100 UNIT/ML Solostar Pen, Inject 60 Units into the skin every evening., Disp: 3 mL, Rfl: 2 .  lisinopril (ZESTRIL) 5 MG tablet,  Take 1 tablet (5 mg total) by mouth at bedtime., Disp: 90 tablet, Rfl: 0 .  metFORMIN (GLUCOPHAGE) 1000 MG tablet, Take 1 tablet (1,000 mg total) by mouth 2 (two) times daily with a meal., Disp: 180 tablet, Rfl: 0 .  nitroGLYCERIN (NITROSTAT) 0.4 MG SL tablet, Place 1 tablet (0.4 mg total) under the tongue every 5 (five) minutes as needed. For chest pain., Disp: 25 tablet, Rfl: 3 .  omeprazole (PRILOSEC) 40 MG capsule, TAKE 1 CAPSULE BY MOUTH TWICE DAILY, Disp: 180 capsule, Rfl: 1 .  sertraline (ZOLOFT) 100 MG tablet, Take 1 tablet (100 mg total) by mouth daily., Disp: 90 tablet, Rfl: 0 .  tiZANidine (ZANAFLEX) 4 MG tablet, TAKE 1/2 TO 1 TABLET(2 TO 4 MG) BY MOUTH EVERY 8 HOURS AS NEEDED FOR MUSCLE SPASMS, Disp: 30 tablet,  Rfl: 2 .  traZODone (DESYREL) 150 MG tablet, TAKE 1 TABLET BY MOUTH AT BEDTIME, Disp: 90 tablet, Rfl: 0 Social History   Socioeconomic History  . Marital status: Divorced    Spouse name: rayann Turri  . Number of children: 0  . Years of education: Not on file  . Highest education level: Not on file  Occupational History  . Occupation: disabled  Tobacco Use  . Smoking status: Current Every Day Smoker    Packs/day: 1.00    Years: 31.00    Ursin years: 31.00    Types: Cigarettes    Start date: 10/09/1981  . Smokeless tobacco: Never Used  . Tobacco comment: 1/2 [place] daily 11/01/17  Substance and Sexual Activity  . 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  . Drug use: No  . Sexual activity: Yes  Other Topics Concern  . Not on file  Social History Narrative   Lives at home in an apartment alone. He is divorced and does not have any children. He has two brothers but is estranged from one. He only talks with the other one infrequently. He does have contact with his ex wife and they have a good relationship. They help each other out.    Social Determinants of Health   Financial Resource Strain:   . Difficulty of Paying Living Expenses: Not on file  Food Insecurity:   . Worried About Charity fundraiser in the Last Year: Not on file  . Ran Out of Food in the Last Year: Not on file  Transportation Needs:   . Lack of Transportation (Medical): Not on file  . Lack of Transportation (Non-Medical): Not on file  Physical Activity:   . Days of Exercise per Week: Not on file  . Minutes of Exercise per Session: Not on file  Stress:   . Feeling of Stress : Not on file  Social Connections:   . Frequency of Communication with Friends and Family: Not on file  . Frequency of Social Gatherings with Friends and Family: Not on file  . Attends Religious Services: Not on file    . Active Member of Clubs or Organizations: Not on file  . Attends Archivist Meetings: Not on file  . Marital Status: Not on file  Intimate Partner Violence:   . Fear of Current or Ex-Partner: Not on file  . Emotionally Abused: Not on file  . Physically Abused: Not on file  . Sexually Abused: Not on file   Family History  Problem Relation  Age of Onset  . Stroke Mother   . Alcohol abuse Father   . Heart disease Other   . Other Other        not real familiar with family history  . Lung cancer Other   . Colon cancer Neg Hx   . Colon polyps Neg Hx     Objective: Office vital signs reviewed. BP 132/75   Pulse (!) 104   Temp (!) 97.3 F (36.3 C) (Temporal)   Ht 6\' 2"  (1.88 m)   Wt 195 lb (88.5 kg)   SpO2 96%   BMI 25.04 kg/m   Physical Examination:  General: Awake, alert, chronically ill appearing male. No acute distress HEENT: sclera injected. Cardio: regular rate Pulm: normal work of breathing on room air MSK: Ambulating independently today Neuro: see DM foot below. Has vibratory sensation on left but not right.  Diabetic Foot Exam - Simple   Simple Foot Form Diabetic Foot exam was performed with the following findings: Yes 10/29/2019  2:00 PM  Visual Inspection See comments: Yes Sensation Testing See comments: Yes Pulse Check See comments: Yes Comments +1 pedal pulses bilaterally.  Feet are cold and hairless.  He has 0 monofilament sensation bilaterally.  He has onychomycotic changes to bilateral feet.  There is what appears to be a preulcerative callus along the medial left ankle as well as the first MCP on the right on the plantar surface.  No skin breakdown except for the flakiness of the skin related to fungus.     Assessment/ Plan: 54 y.o. male   1. Type 2 diabetes mellitus with diabetic polyneuropathy, with long-term current use of insulin (HCC) Grossly uncontrolled.  I discussed with him that we will seek help from endocrinology given ongoing  issues with blood sugar.  I suspect much of this is due to noncompliance but will await their recommendations with regards to insulin.  Perhaps he would be better off doing a mixed insulin. - Bayer DCA Hb A1c Waived - PR DIABETIC DELUXE SHOE - Ambulatory referral to Endocrinology - Basic Metabolic Panel  2. Pre-ulcerative calluses Diabetic shoe request sent to [place] per patient's request. - [place] - Ambulatory referral to Endocrinology   Orders Placed This Encounter  Procedures  . Bayer DCA Hb A1c Waived  . Basic Metabolic Panel  . Ambulatory referral to Endocrinology    Referral Priority:   Routine    Referral Type:   Consultation    Referral Reason:   Specialty Services Required    Number of Visits Requested:   1  . PR DIABETIC DELUXE SHOE    E 11.42, L84   No orders of the defined types were placed in this encounter.    [name], DO [place] [phone]

## 2019-10-30 ENCOUNTER — Other Ambulatory Visit: Payer: Self-pay | Admitting: Family

## 2019-10-30 ENCOUNTER — Telehealth: Payer: Self-pay | Admitting: *Deleted

## 2019-10-30 ENCOUNTER — Ambulatory Visit: Payer: Self-pay | Admitting: *Deleted

## 2019-10-30 DIAGNOSIS — F32A Depression, unspecified: Secondary | ICD-10-CM

## 2019-10-30 DIAGNOSIS — F329 Major depressive disorder, single episode, unspecified: Secondary | ICD-10-CM

## 2019-10-30 DIAGNOSIS — E1142 Type 2 diabetes mellitus with diabetic polyneuropathy: Secondary | ICD-10-CM

## 2019-10-30 DIAGNOSIS — Z794 Long term (current) use of insulin: Secondary | ICD-10-CM

## 2019-10-30 LAB — BASIC METABOLIC PANEL
BUN/Creatinine Ratio: 10 (ref 9–20)
BUN: 15 mg/dL (ref 6–24)
CO2: 23 mmol/L (ref 20–29)
Calcium: 9.8 mg/dL (ref 8.7–10.2)
Chloride: 86 mmol/L — ABNORMAL LOW (ref 96–106)
Creatinine, Ser: 1.5 mg/dL — ABNORMAL HIGH (ref 0.76–1.27)
GFR calc Af Amer: 60 mL/min/{1.73_m2} (ref >59)
GFR calc non Af Amer: 52 mL/min/{1.73_m2} — ABNORMAL LOW (ref >59)
Glucose: 458 mg/dL (ref 65–99)
Potassium: 4.6 mmol/L (ref 3.5–5.2)
Sodium: 127 mmol/L — ABNORMAL LOW (ref 134–144)

## 2019-10-30 NOTE — Telephone Encounter (Signed)
Patient aware of recent lab results.  He has a appointment scheduled with endocrinology soon.[name] He found the battery for his testing machine and now it works.  He was advised to take medications correctly and be mindful of his diet.

## 2019-10-30 NOTE — Chronic Care Management (AMB) (Signed)
  Chronic Care Management   Care Coordination Note  10/30/2019 Name: Dustin Cruz MRN: [id number] DOB: 06/11/1966  RN Care Plan           This Visit's Progress   . Housing Assistance       Need for housing assistance in a patient with depression, diabetes, HTN, and ASCVD.  Current Barriers:  [name] Knowledge Deficits related to housing assistance . Lacks caregiver support.  . Film/video editor.   Nurse Case Manager Clinical Goal(s):  [name] Over the next 30 days, patient will work with LCSW/CCM team to apply for affordable housing  Interventions:  . Consulted by PCP regarding patient's need for affordable housing . Collaborated with Theadore Nan, LCSW who has helped him fill out housing applications in the recent past . LCSW was waiting on patient to call back once he picked up another application . LCSW will follow-up with patient regarding housing assistance  Patient Self Care Activities:  . Performs ADL's independently . Performs IADL's independently  Initial goal documentation         Follow up plan: The care management team will reach out to the patient again over the next 30 days.   [name], BSN, RN-BC Embedded Chronic Care Manager [facility] [place] [facility] Direct Dial: [phone]

## 2019-10-30 NOTE — Progress Notes (Signed)
Aware. 

## 2019-10-30 NOTE — Progress Notes (Signed)
Left message to please call our office. 

## 2019-10-30 NOTE — Patient Instructions (Signed)
Visit Information  Goals Addressed            This Visit's Progress   . Housing Assistance       Need for housing assistance in a patient with depression, diabetes, HTN, and ASCVD.  Current Barriers:  [name] Knowledge Deficits related to housing assistance . Lacks caregiver support.  . Film/video editor.   Nurse Case Manager Clinical Goal(s):  [name] Over the next 30 days, patient will work with LCSW/CCM team to apply for affordable housing  Interventions:  . Consulted by PCP regarding patient's need for affordable housing . Collaborated with Theadore Nan, LCSW who has helped him fill out housing applications in the recent past . LCSW was waiting on patient to call back once he picked up another application . LCSW will follow-up with patient regarding housing assistance  Patient Self Care Activities:  . Performs ADL's independently . Performs IADL's independently  Initial goal documentation        The care management team will reach out to the patient again over the next 30 days.    [name], BSN, RN-BC Embedded Chronic Care Manager [facility] [place] [facility] Direct Dial: [phone]

## 2019-10-30 NOTE — Progress Notes (Signed)
Pt's glucose is very elevated. Does patient had a glucose monitor at home? If so please have him recheck this and see what his level is. I see he is noncompliant with his medications and diet. I have reviewed his visit with Dr. Lajuana Ripple and see she placed a referral to Endocrinologists. Please remind him the importance of taking his medications every day.

## 2019-11-04 ENCOUNTER — Ambulatory Visit: Payer: Medicare Other | Admitting: *Deleted

## 2019-11-04 DIAGNOSIS — E1142 Type 2 diabetes mellitus with diabetic polyneuropathy: Secondary | ICD-10-CM

## 2019-11-04 DIAGNOSIS — Z794 Long term (current) use of insulin: Secondary | ICD-10-CM

## 2019-11-04 NOTE — Patient Instructions (Signed)
Visit Information  Goals Addressed            This Visit's Progress   . Diabetes Management        Current Barriers:  . Chronic Disease Management support and education needs related to diabetes . Financial constraints . Housing   Nurse Case Manager Clinical Goal(s):  [name] Over the next 14 days, patient will keep appointment with endocrinologist . Over the next 30 days, patient will check his blood sugar daily as directed and as needed . Over the next 90 days, patient will take his medication as prescribed  Interventions:  . Chart reviewed including recent office notes and labs . Verified upcoming new patient appointment with Dr Dorris Fetch for 11/12/2019 . Reached out to patient by phone to discuss blood sugar management but was unsuccessful . RN will reach out to patient again over the next 15 days  Patient Self Care Activities:  . Performs ADL's independently . Performs IADL's independently  Initial goal documentation        A HIPPA compliant phone message was left for the patient providing contact information and requesting a return call.  The care management team will reach out to the patient again over the next 15 days.   [name], BSN, RN-BC Embedded Chronic Care Manager [facility] Direct Dial: [phone]   The patient verbalized understanding of instructions provided today and declined a print copy of patient instruction materials.

## 2019-11-04 NOTE — Chronic Care Management (AMB) (Signed)
Chronic Care Management   Follow Up Note   11/04/2019 Name: Dustin Cruz MRN: [id number] DOB: 1966/03/26  Referred by: [name], DO Reason for referral : Chronic Care Management (RN follow up)   Dustin Cruz is a 54 y.o. year old male who is a primary care patient of [name], DO. The CCM team was consulted for assistance with chronic disease management and care coordination needs.    I reached out to Dustin Cruz by telephone today but was unsuccessful. Voicemail message left requesting return phone call.   Objective:  Outpatient Encounter Medications as of 11/04/2019  Medication Sig  . acetaminophen (TYLENOL) 325 MG tablet Take 2 tablets (650 mg total) by mouth every 6 (six) hours as needed for mild pain, moderate pain or fever (or Fever >/= 101).  [name] albuterol (PROVENTIL) (2.5 MG/3ML) 0.083% nebulizer solution Take 3 mLs (2.5 mg total) by nebulization every 6 (six) hours as needed for wheezing or shortness of breath. Dx---44.1  . albuterol (VENTOLIN HFA) 108 (90 Base) MCG/ACT inhaler Inhale 2 puffs into the lungs every 6 (six) hours as needed for wheezing. Dx--44.1  . apixaban (ELIQUIS) 5 MG TABS tablet Take 1 tablet (5 mg total) by mouth 2 (two) times daily.  [name] aspirin EC 81 MG tablet Take 1 tablet (81 mg total) by mouth daily with breakfast.  . atorvastatin (LIPITOR) 80 MG tablet Take 1 tablet (80 mg total) by mouth daily.  . busPIRone (BUSPAR) 7.5 MG tablet TAKE 1 TABLET BY MOUTH TWICE DAILY  . fenofibrate (TRICOR) 145 MG tablet Take 1 tablet (145 mg total) by mouth daily.  [name] gabapentin (NEURONTIN) 600 MG tablet Take 1 tablet (600 mg total) by mouth 3 (three) times daily.  . insulin aspart (NOVOLOG FLEXPEN) 100 UNIT/ML FlexPen Inject 10 Units into the skin 3 (three) times daily with meals. Inject up to 10 units prior to meal per sliding scale.  . Insulin Glargine (LANTUS SOLOSTAR) 100 UNIT/ML Solostar Pen Inject 60 Units into the skin every evening.  [name] lisinopril  (ZESTRIL) 5 MG tablet Take 1 tablet (5 mg total) by mouth at bedtime.  . metFORMIN (GLUCOPHAGE) 1000 MG tablet Take 1 tablet (1,000 mg total) by mouth 2 (two) times daily with a meal.  . nitroGLYCERIN (NITROSTAT) 0.4 MG SL tablet Place 1 tablet (0.4 mg total) under the tongue every 5 (five) minutes as needed. For chest pain.  [name] omeprazole (PRILOSEC) 40 MG capsule TAKE 1 CAPSULE BY MOUTH TWICE DAILY  . sertraline (ZOLOFT) 100 MG tablet Take 1 tablet (100 mg total) by mouth daily.  [name] tiZANidine (ZANAFLEX) 4 MG tablet TAKE 1/2 TO 1 TABLET(2 TO 4 MG) BY MOUTH EVERY 8 HOURS AS NEEDED FOR MUSCLE SPASMS  . traZODone (DESYREL) 150 MG tablet TAKE 1 TABLET BY MOUTH AT BEDTIME  . [DISCONTINUED] metoprolol tartrate (LOPRESSOR) 25 MG tablet Take 25 mg by mouth 2 (two) times daily.     No facility-administered encounter medications on file as of 11/04/2019.    Lab Results  Component Value Date   HGBA1C >14.0 (H) 10/29/2019   HGBA1C >14.0 (H) 06/30/2019   HGBA1C 9.5 (H) 11/22/2018   Lab Results  Component Value Date   MICROALBUR negative 05/17/2015   LDLCALC 52 01/15/2017   CREATININE 1.50 (H) 10/29/2019    RN Care Plan           This Visit's Progress   . Diabetes Management        Current Barriers:  .  Chronic Disease Management support and education needs related to diabetes . Financial constraints . Housing   Nurse Case Manager Clinical Goal(s):  [name] Over the next 14 days, patient will keep appointment with endocrinologist . Over the next 30 days, patient will check his blood sugar daily as directed and as needed . Over the next 90 days, patient will take his medication as prescribed  Interventions:  . Chart reviewed including recent office notes and labs . Verified upcoming new patient appointment with Dr Dorris Fetch for 11/12/2019 . Reached out to patient by phone to discuss blood sugar management but was unsuccessful . RN will reach out to patient again over the next 15 days  Patient Self  Care Activities:  . Performs ADL's independently . Performs IADL's independently  Initial goal documentation         Plan:  A HIPPA compliant phone message was left for the patient providing contact information and requesting a return call.  The care management team will reach out to the patient again over the next 15 days.    [name], BSN, RN-BC Embedded Chronic Care Manager [facility] [place] Management Direct Dial: [phone]

## 2019-11-05 ENCOUNTER — Telehealth: Payer: Self-pay

## 2019-11-05 NOTE — Telephone Encounter (Signed)
11/05/2019 Attempted to call unable to leave message voicemail did not pick up.  Will call again. Laurell Roof [phone]

## 2019-11-06 ENCOUNTER — Telehealth: Payer: Self-pay | Admitting: *Deleted

## 2019-11-06 ENCOUNTER — Telehealth: Payer: Self-pay

## 2019-11-06 ENCOUNTER — Other Ambulatory Visit: Payer: Self-pay | Admitting: Physician Assistant

## 2019-11-06 NOTE — Telephone Encounter (Signed)
11/06/2019  Patient was contacted by Beatty this morning and while he had her on the phone, he said that he needed three medications, Buspar, Zanaflex, and Neurontin, refilled and sent to [facility].   He previously used [facility]. It looks like Buspar and Zanaflex may have two remaining refills at [facility] but Neurontin does not have any refills. Please review and refill as appropriate.   [name], BSN, RN-BC Embedded Chronic Care Manager [facility] [place] Management Direct Dial: [phone]

## 2019-11-06 NOTE — Telephone Encounter (Signed)
gabapentin (NEURONTIN) 600 MG tablet 08/26/2019 0 ordered       Dose, Frequency: 600 mg, 3 times daily     Dose, Route, Frequency: 600 mg, Oral, 3 times daily Start: 08/26/2019 Ord/Sold: 08/26/2019 (O) Pharmacy: [facility], [place] - [address] AT [address] Report Taking:  Long-term:  Med Dose History     Summary: Take 1 tablet (600 mg total) by mouth 3 (three) times daily., Starting Tue 08/26/2019, Normal     Patient Sig: Take 1 tablet (600 mg total) by mouth 3 (three) times daily.     Authorized by: [name]     Dispense: 270 tablet     Note to Pharmacy: Pre- [facility] for pt     There is a note at the bottom of the script to pre-[facility].

## 2019-11-06 NOTE — Telephone Encounter (Signed)
11/06/2019 Spoke with patient he received mailed transportation resources and has transportation set-up for appointments. Patient needs refills for Zanaflex 4mg , Buspar 7.5mg  and Neurontin 600mg . Also he has changed his pharmacy to [facility], [place]. [address] [place] [address], [phone]. Ambrose Mantle [phone]

## 2019-11-06 NOTE — Telephone Encounter (Signed)
Thank you Millie. I created a telephone message and sent it to the clinical staff at [facility] to review and act on as appropriate.  [name], BSN, RN-BC Embedded Chronic Care Manager [facility] / [place] Management Direct Dial: [phone]

## 2019-11-06 NOTE — Telephone Encounter (Signed)
gabapentin (NEURONTIN) 600 MG tablet [id number]    Order Details Dose: 600 mg Route: Oral Frequency: 3 times daily  Dispense Quantity: 270 tablet Refills: 0   Note to Pharmacy: Pre- [facility] for pt  Indications of Use: Diabetic Neuropathy       Sig: Take 1 tablet (600 mg total) by mouth 3 (three) times daily.       Start Date: 08/26/19 End Date: --  Written Date: 08/26/19 Expiration Date: 08/25/20  Original Order:  gabapentin (NEURONTIN) 600 MG tablet [id number]  Providers   He should not need a refill until about 11/23/19.  Did the pharmacy fill a 90 day supply?

## 2019-11-06 NOTE — Telephone Encounter (Signed)
Patient is needing sent to [facility] so they will prepackage

## 2019-11-07 ENCOUNTER — Ambulatory Visit: Payer: Self-pay | Admitting: Licensed Clinical Social Worker

## 2019-11-07 DIAGNOSIS — E785 Hyperlipidemia, unspecified: Secondary | ICD-10-CM

## 2019-11-07 DIAGNOSIS — N189 Chronic kidney disease, unspecified: Secondary | ICD-10-CM | POA: Diagnosis not present

## 2019-11-07 DIAGNOSIS — I10 Essential (primary) hypertension: Secondary | ICD-10-CM

## 2019-11-07 DIAGNOSIS — I129 Hypertensive chronic kidney disease with stage 1 through stage 4 chronic kidney disease, or unspecified chronic kidney disease: Secondary | ICD-10-CM | POA: Diagnosis not present

## 2019-11-07 DIAGNOSIS — Z794 Long term (current) use of insulin: Secondary | ICD-10-CM | POA: Diagnosis not present

## 2019-11-07 DIAGNOSIS — I517 Cardiomegaly: Secondary | ICD-10-CM | POA: Diagnosis not present

## 2019-11-07 DIAGNOSIS — E871 Hypo-osmolality and hyponatremia: Secondary | ICD-10-CM | POA: Diagnosis not present

## 2019-11-07 DIAGNOSIS — F329 Major depressive disorder, single episode, unspecified: Secondary | ICD-10-CM | POA: Diagnosis not present

## 2019-11-07 DIAGNOSIS — E1142 Type 2 diabetes mellitus with diabetic polyneuropathy: Secondary | ICD-10-CM | POA: Diagnosis not present

## 2019-11-07 DIAGNOSIS — K219 Gastro-esophageal reflux disease without esophagitis: Secondary | ICD-10-CM

## 2019-11-07 DIAGNOSIS — F32A Depression, unspecified: Secondary | ICD-10-CM

## 2019-11-07 DIAGNOSIS — R809 Proteinuria, unspecified: Secondary | ICD-10-CM | POA: Diagnosis not present

## 2019-11-07 NOTE — Patient Instructions (Addendum)
Licensed Clinical Social Worker Visit Information  Goals we discussed today:  Goals        . Client said he had dental care issues and needs to see dentist for care (pt-stated)     Barriers:  Dental pain issues in patient with Chronic Diagnoses of HTN, Depression, GERD, DM Type 2, and HLD  Financial Challenges  Clinical Social Work Goal: Over next 30 days , client  will work with LCSW to address concerns related to dental care needs of client  Interventions: LCSW encouraged client to communicate with RNCM regarding nursing needs of client Talked with client about current dental needs of client and dental resources for client in the area Talked with client about housing needs of client Talked with client about sleeping challenges of client Talked with client about pain issues of client Talked with Dustin Cruz about his upcoming appointments Talked with client about transport needs of client Talked with client about [facility] Dual Complete program  Patient Self Care Activities: Self administers medications as prescribed Attends all scheduled provider appointments Performs ADL's independently  Plan: Client to call LCSW as needed to discuss psychosocial needs of client LCSW to call client in next 4 weeks to discuss current client needs including dental needs of client Client to communicate with RN CM to discuss nursing needs of client.  Client to call dentist, Dr. Costella Hatcher , at [phone], to schedule client dental appointment    .       Materials Provided: No  Follow Up Plan:  LCSW to call client in next 4 weeks to discuss current client needs including the dental needs of client  The patient verbalized understanding of instructions provided today and declined a print copy of patient instruction materials.   Dustin Cruz MSW, LCSW Licensed Clinical Social Worker [facility] Care Management [phone]

## 2019-11-07 NOTE — Chronic Care Management (AMB) (Signed)
  Care Management Note   Dustin Cruz is a 54 y.o. year old male who is a primary care patient of [name], DO. The CM team was consulted for assistance with chronic disease management and care coordination.   I reached out to Se[place] by phone today.   Review of patient status, including review of consultants reports, relevant laboratory and other test results, and collaboration with appropriate care team members and the patient's provider was performed as part of comprehensive patient evaluation and provision of chronic care management services.  Social determinants of health : risk of tobacco use; risk of depression; risk of housing needs    Office Visit from 10/29/2019 in [facility]  PHQ-9 Total Score  0     GAD 7 : Generalized Anxiety Score 11/22/2018  Nervous, Anxious, on Edge 0  Control/stop worrying 0  Worry too much - different things 0  Trouble relaxing 0  Restless 0  Easily annoyed or irritable 0  Afraid - awful might happen 0  Total GAD 7 Score 0  Anxiety Difficulty Not difficult at all    Medications   (very important)  New medications from outside sources are available for reconciliation  acetaminophen (TYLENOL) 325 MG tablet albuterol (PROVENTIL) (2.5 MG/3ML) 0.083% nebulizer solution albuterol (VENTOLIN HFA) 108 (90 Base) MCG/ACT inhaler apixaban (ELIQUIS) 5 MG TABS tablet aspirin EC 81 MG tablet atorvastatin (LIPITOR) 80 MG tablet busPIRone (BUSPAR) 7.5 MG tablet fenofibrate (TRICOR) 145 MG tablet gabapentin (NEURONTIN) 600 MG tablet insulin aspart (NOVOLOG FLEXPEN) 100 UNIT/ML FlexPen Insulin Glargine (LANTUS SOLOSTAR) 100 UNIT/ML Solostar Pen lisinopril (ZESTRIL) 5 MG tablet metFORMIN (GLUCOPHAGE) 1000 MG tablet nitroGLYCERIN (NITROSTAT) 0.4 MG SL tablet omeprazole (PRILOSEC) 40 MG capsule sertraline (ZOLOFT) 100 MG tablet tiZANidine (ZANAFLEX) 4 MG tablet traZODone (DESYREL) 150 MG tablet  GOAL:  . Client said  he had dental care issues and needs to see dentist for care (pt-stated)     Barriers:  Dental pain issues in patient with Chronic Diagnoses of HTN, Depression, GERD, DM Type 2, and HLD  Financial Challenges  Clinical Social Work Goal: Over next 30 days , client  will work with LCSW to address concerns related to dental care needs of client  Interventions: LCSW encouraged client to communicate with RNCM regarding nursing needs of client Talked with client about current dental needs of client and dental resources for client in the area Talked with client about financial needs of client. Talked with client about housing needs of client Talked with client about sleeping challenges of client Talked with client about pain issues of client Talked with Dustin Cruz about his upcoming appointments Talked with client about transport needs of client Talked with client about [facility] Dual Complete program  Patient Self Care Activities: Self administers medications as prescribed Attends all scheduled provider appointments Performs ADL's independently  Plan: Client to call LCSW as needed to discuss psychosocial needs of client LCSW to call client in next 4 weeks to discuss current client needs including dental needs of client Client to communicate with RN CM to discuss nursing needs of client.  Client to call dentist, Dr. Costella Hatcher , at [phone], to schedule client dental appointment      Follow Up Plan: LCSW to call client in next 4 weeks to discuss current needs of client including dental needs of client  Dustin Cruz.Dustin Cruz MSW, LCSW Licensed Clinical Social Worker [facility] Care Management [phone]

## 2019-11-10 ENCOUNTER — Other Ambulatory Visit: Payer: Self-pay

## 2019-11-10 ENCOUNTER — Other Ambulatory Visit: Payer: Medicare Other

## 2019-11-11 ENCOUNTER — Other Ambulatory Visit: Payer: Self-pay | Admitting: Family Medicine

## 2019-11-11 DIAGNOSIS — R05 Cough: Secondary | ICD-10-CM

## 2019-11-11 DIAGNOSIS — R059 Cough, unspecified: Secondary | ICD-10-CM

## 2019-11-12 ENCOUNTER — Other Ambulatory Visit: Payer: Self-pay

## 2019-11-12 ENCOUNTER — Encounter: Payer: Medicare Other | Attending: Family Medicine | Admitting: Nutrition

## 2019-11-12 ENCOUNTER — Other Ambulatory Visit: Payer: Medicare Other

## 2019-11-12 ENCOUNTER — Encounter: Payer: Self-pay | Admitting: "Endocrinology

## 2019-11-12 ENCOUNTER — Ambulatory Visit (INDEPENDENT_AMBULATORY_CARE_PROVIDER_SITE_OTHER): Payer: Medicare Other | Admitting: "Endocrinology

## 2019-11-12 VITALS — BP 136/82 | HR 94 | Ht 74.0 in | Wt 183.0 lb

## 2019-11-12 DIAGNOSIS — R809 Proteinuria, unspecified: Secondary | ICD-10-CM | POA: Diagnosis not present

## 2019-11-12 DIAGNOSIS — N183 Chronic kidney disease, stage 3 unspecified: Secondary | ICD-10-CM | POA: Insufficient documentation

## 2019-11-12 DIAGNOSIS — Z79899 Other long term (current) drug therapy: Secondary | ICD-10-CM | POA: Diagnosis not present

## 2019-11-12 DIAGNOSIS — E1121 Type 2 diabetes mellitus with diabetic nephropathy: Secondary | ICD-10-CM | POA: Diagnosis not present

## 2019-11-12 DIAGNOSIS — Z794 Long term (current) use of insulin: Secondary | ICD-10-CM

## 2019-11-12 DIAGNOSIS — F17200 Nicotine dependence, unspecified, uncomplicated: Secondary | ICD-10-CM

## 2019-11-12 DIAGNOSIS — N1831 Chronic kidney disease, stage 3a: Secondary | ICD-10-CM | POA: Diagnosis not present

## 2019-11-12 DIAGNOSIS — I10 Essential (primary) hypertension: Secondary | ICD-10-CM

## 2019-11-12 DIAGNOSIS — E1122 Type 2 diabetes mellitus with diabetic chronic kidney disease: Secondary | ICD-10-CM | POA: Diagnosis not present

## 2019-11-12 DIAGNOSIS — M1000 Idiopathic gout, unspecified site: Secondary | ICD-10-CM | POA: Diagnosis not present

## 2019-11-12 DIAGNOSIS — D631 Anemia in chronic kidney disease: Secondary | ICD-10-CM | POA: Diagnosis not present

## 2019-11-12 DIAGNOSIS — E1165 Type 2 diabetes mellitus with hyperglycemia: Secondary | ICD-10-CM | POA: Diagnosis not present

## 2019-11-12 DIAGNOSIS — E782 Mixed hyperlipidemia: Secondary | ICD-10-CM | POA: Diagnosis not present

## 2019-11-12 DIAGNOSIS — E559 Vitamin D deficiency, unspecified: Secondary | ICD-10-CM | POA: Diagnosis not present

## 2019-11-12 DIAGNOSIS — IMO0002 Reserved for concepts with insufficient information to code with codable children: Secondary | ICD-10-CM

## 2019-11-12 DIAGNOSIS — E118 Type 2 diabetes mellitus with unspecified complications: Secondary | ICD-10-CM | POA: Diagnosis not present

## 2019-11-12 DIAGNOSIS — R634 Abnormal weight loss: Secondary | ICD-10-CM | POA: Insufficient documentation

## 2019-11-12 MED ORDER — ACCU-CHEK GUIDE W/DEVICE KIT: 1.0000 | PACK | 0 refills | Status: DC

## 2019-11-12 MED ORDER — ACCU-CHEK GUIDE VI STRP: ORAL_STRIP | 2 refills | Status: DC

## 2019-11-12 MED ORDER — METFORMIN HCL 500 MG PO TABS: 500.0000 mg | ORAL_TABLET | Freq: Two times a day (BID) | ORAL | 1 refills | Status: DC

## 2019-11-12 MED ORDER — NOVOLOG FLEXPEN 100 UNIT/ML ~~LOC~~ SOPN: 10.0000 [IU] | PEN_INJECTOR | Freq: Three times a day (TID) | SUBCUTANEOUS | 2 refills | Status: DC

## 2019-11-12 NOTE — Progress Notes (Signed)
Endocrinology Consult Note       11/12/2019, 4:45 PM   Subjective:    Patient ID: Dustin Cruz, male    DOB: 1966-02-05.  Dustin Cruz is being seen in consultation for management of currently uncontrolled symptomatic diabetes requested by  [name], DO.   Past Medical History:  Diagnosis Date  . Anxiety   . Asthma   . CKD stage 3 due to type 2 diabetes mellitus ([place])   . COPD (chronic obstructive pulmonary disease) ([place])   . Coronary artery disease    s/p BMS to Ramus 10/2010;  Cath 01/22/12 prox 30-40% LAD, LCx ramus w/ hazy 70-80% in-stent restenosis, EF 60% treated medically  . Coronary artery disease    s/p BMS to Ramus 10/2010;  Cath 01/22/12 prox 30-40% LAD, LCx ramus w/ hazy 70-80% in-stent restenosis, EF 60% treated medically   . Depression   . Diabetes mellitus    Type 2  . Gastroparesis 05/2013  . GERD (gastroesophageal reflux disease)   . HTN (hypertension)   . Hyperlipidemia   . Hyperlipidemia   . Major depression, chronic 08/13/2012  . Melanoma ([place]) 2007   surgery at [facility], Followed by [facility]  . Myocardial infarction ([place]) 2012  . Obesity   . Tobacco abuse   . Tubular adenoma   . Urinary retention     Past Surgical History:  Procedure Laterality Date  . BIOPSY  01/07/2018   Procedure: BIOPSY;  Surgeon: Daneil Dolin, MD;  Location[facility];  Service: Endoscopy;;  duodenal and gastric biopsy  . BIOPSY  04/25/2018   Procedure: BIOPSY;  Surgeon: Daneil Dolin, MD;  Location: [facility];  Service: Endoscopy;;  ascending and descending and sigmoid biopsies  . CARDIAC CATHETERIZATION     with stent  . COLONOSCOPY N/A 01/27/2013   PPI:RJJOAC and colonic polyps. Tubular adenomas, poor bowel prep, one-year follow-up surveillance colonoscopy recommended  . COLONOSCOPY WITH PROPOFOL N/A 04/25/2018   Procedure: COLONOSCOPY WITH PROPOFOL;  Surgeon: Daneil Dolin, MD;   Location: [facility];  Service: Endoscopy;  Laterality: N/A;  11:00am - pt to be prepped INPT and labs to be done as well  . CORONARY ANGIOPLASTY WITH STENT PLACEMENT    . ESOPHAGOGASTRODUODENOSCOPY  04/24/2012   [place]-mild erosive reflux esophagitis,dilated w/107F Venia Minks, small HH, minimal chronic gastric/bulbar erosions(No H pylori)  . ESOPHAGOGASTRODUODENOSCOPY (EGD) WITH PROPOFOL N/A 01/07/2018   Dr. Gala Romney: Erythematous mucosa in the stomach, retained gastric contents, incomplete exam.  Biopsied showed mild chronic gastritis but no H. pylori.  Duodenal biopsy was negative for celiac disease.  [name] FLEXIBLE SIGMOIDOSCOPY N/A 01/07/2018   Dr. Gala Romney: Incomplete colonoscopy due to inadequate bowel prep  . LEFT HEART CATHETERIZATION WITH CORONARY ANGIOGRAM N/A 01/22/2012   Procedure: LEFT HEART CATHETERIZATION WITH CORONARY ANGIOGRAM;  Surgeon: Jolaine Artist, MD;  Location: [facility] CATH LAB;  Service: Cardiovascular;  Laterality: N/A;  . melanoma surgery  2007   [place], removed lymph nodes under arm as well,Left abd    Social History   Socioeconomic History  . Marital status: Divorced    Spouse name: rayann Vanderwoude  .  Number of children: 0  . Years of education: Not on file  . Highest education level: Not on file  Occupational History  . Occupation: disabled  Tobacco Use  . Smoking status: Current Every Day Smoker    Packs/day: 1.00    Years: 31.00    [place] years: 31.00    Types: Cigarettes    Start date: 10/09/1981  . Smokeless tobacco: Never Used  . Tobacco comment: 1/2 [place] daily 11/01/17  Substance and Sexual Activity  . 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  . Drug use: No  . Sexual activity: Yes  Other Topics Concern  . Not on file  Social History Narrative   Lives at home in an apartment alone. He is divorced and does not have any children. He has two  brothers but is estranged from one. He only talks with the other one infrequently. He does have contact with his ex wife and they have a good relationship. They help each other out.    Social Determinants of Health   Financial Resource Strain:   . Difficulty of Paying Living Expenses: Not on file  Food Insecurity:   . Worried About Charity fundraiser in the Last Year: Not on file  . Ran Out of Food in the Last Year: Not on file  Transportation Needs:   . Lack of Transportation (Medical): Not on file  . Lack of Transportation (Non-Medical): Not on file  Physical Activity:   . Days of Exercise per Week: Not on file  . Minutes of Exercise per Session: Not on file  Stress:   . Feeling of Stress : Not on file  Social Connections:   . Frequency of Communication with Friends and Family: Not on file  . Frequency of Social Gatherings with Friends and Family: Not on file  . Attends Religious Services: Not on file  . Active Member of Clubs or Organizations: Not on file  . Attends Archivist Meetings: Not on file  . Marital Status: Not on file    Family History  Problem Relation Age of Onset  . Stroke Mother   . Alcohol abuse Father   . Heart disease Other   . Other Other        not real familiar with family history  . Lung cancer Other   . Colon cancer Neg Hx   . Colon polyps Neg Hx     Outpatient Encounter Medications as of 11/12/2019  Medication Sig  . acetaminophen (TYLENOL) 325 MG tablet Take 2 tablets (650 mg total) by mouth every 6 (six) hours as needed for mild pain, moderate pain or fever (or Fever >/= 101).  [name] albuterol (PROVENTIL) (2.5 MG/3ML) 0.083% nebulizer solution Take 3 mLs (2.5 mg total) by nebulization every 6 (six) hours as needed for wheezing or shortness of breath. Dx---44.1  . albuterol (VENTOLIN HFA) 108 (90 Base) MCG/ACT inhaler 2 PUFFS INTO THE LUNGS EVERY 6 HOURS AS NEEDED FOR WHEEZING.  [name] apixaban (ELIQUIS) 5 MG TABS tablet Take 1 tablet (5 mg  total) by mouth 2 (two) times daily.  [name] aspirin EC 81 MG tablet Take 1 tablet (81 mg total) by mouth daily with breakfast.  . atorvastatin (LIPITOR) 80 MG tablet Take 1 tablet (80 mg total) by mouth daily.  . Blood Glucose Monitoring Suppl (ACCU-CHEK GUIDE) w/Device KIT 1  Piece by Does not apply route as directed.  . busPIRone (BUSPAR) 7.5 MG tablet TAKE 1 TABLET BY MOUTH TWICE DAILY  . fenofibrate (TRICOR) 145 MG tablet Take 1 tablet (145 mg total) by mouth daily.  [name] gabapentin (NEURONTIN) 600 MG tablet Take 1 tablet (600 mg total) by mouth 3 (three) times daily.  [name] glucose blood (ACCU-CHEK GUIDE) test strip Use as instructed  . insulin aspart (NOVOLOG FLEXPEN) 100 UNIT/ML FlexPen Inject 10 Units into the skin 3 (three) times daily before meals.  . Insulin Glargine (LANTUS SOLOSTAR) 100 UNIT/ML Solostar Pen Inject 60 Units into the skin every evening.  [name] lisinopril (ZESTRIL) 5 MG tablet Take 1 tablet (5 mg total) by mouth at bedtime.  . metFORMIN (GLUCOPHAGE) 500 MG tablet Take 1 tablet (500 mg total) by mouth 2 (two) times daily with a meal.  . nitroGLYCERIN (NITROSTAT) 0.4 MG SL tablet Place 1 tablet (0.4 mg total) under the tongue every 5 (five) minutes as needed. For chest pain.  [name] omeprazole (PRILOSEC) 40 MG capsule TAKE 1 CAPSULE BY MOUTH TWICE DAILY  . sertraline (ZOLOFT) 100 MG tablet Take 1 tablet (100 mg total) by mouth daily.  [name] tiZANidine (ZANAFLEX) 4 MG tablet TAKE 1/2 TO 1 TABLET(2 TO 4 MG) BY MOUTH EVERY 8 HOURS AS NEEDED FOR MUSCLE SPASMS  . traZODone (DESYREL) 150 MG tablet TAKE 1 TABLET BY MOUTH AT BEDTIME  . [DISCONTINUED] insulin aspart (NOVOLOG FLEXPEN) 100 UNIT/ML FlexPen Inject 10 Units into the skin 3 (three) times daily with meals. Inject up to 10 units prior to meal per sliding scale.  . [DISCONTINUED] metFORMIN (GLUCOPHAGE) 1000 MG tablet Take 1 tablet (1,000 mg total) by mouth 2 (two) times daily with a meal.  . [DISCONTINUED] metoprolol tartrate (LOPRESSOR) 25 MG  tablet Take 25 mg by mouth 2 (two) times daily.     No facility-administered encounter medications on file as of 11/12/2019.    ALLERGIES: No Known Allergies  VACCINATION STATUS: Immunization History  Administered Date(s) Administered  . Influenza Split 07/05/2012  . Influenza,inj,Quad PF,6+ Mos 09/24/2013, 07/28/2014, 07/09/2015, 07/22/2016, 08/03/2017, 07/23/2018, 10/13/2019  . Pneumococcal Polysaccharide-23 07/05/2012    Diabetes He presents for his initial diabetic visit. He has type 2 diabetes mellitus. Onset time: He was diagnosed at approximate age of 1 years. His disease course has been worsening. There are no hypoglycemic associated symptoms. Pertinent negatives for hypoglycemia include no confusion, headaches, pallor or seizures. Associated symptoms include blurred vision, polydipsia and polyuria. Pertinent negatives for diabetes include no chest pain, no fatigue, no polyphagia and no weakness. There are no hypoglycemic complications. Symptoms are worsening. Diabetic complications include heart disease and nephropathy. Risk factors for coronary artery disease include diabetes mellitus, dyslipidemia, family history, tobacco exposure, sedentary lifestyle, hypertension and male sex. Current diabetic treatments: He is currently on Lantus 60 units nightly, NovoLog 10 units 3 times daily AC.  He is also on Metformin 1000 mg p.o. twice daily. His weight is fluctuating minimally. He is following a generally unhealthy diet. When asked about meal planning, he reported none. He has not had a previous visit with a dietitian. He rarely participates in exercise. Blood glucose monitoring compliance is poor. (He does not monitor blood glucose.  He does not have a meter.  Here with recent A1c of >14%.) An ACE inhibitor/angiotensin II receptor blocker is being taken.  Hyperlipidemia This is a chronic problem. The current episode started more than 1 year ago. The problem is uncontrolled. Recent lipid tests  were  reviewed and are high. Exacerbating diseases include chronic renal disease and diabetes. Pertinent negatives include no chest pain, myalgias or shortness of breath. Current antihyperlipidemic treatment includes statins. Risk factors for coronary artery disease include diabetes mellitus, dyslipidemia, family history, hypertension, male sex and a sedentary lifestyle.  Hypertension This is a chronic problem. The current episode started more than 1 year ago. The problem is controlled. Associated symptoms include blurred vision. Pertinent negatives include no chest pain, headaches, neck pain, palpitations or shortness of breath. Risk factors for coronary artery disease include diabetes mellitus, dyslipidemia, male gender, smoking/tobacco exposure and sedentary lifestyle. Past treatments include ACE inhibitors. Hypertensive end-organ damage includes kidney disease and CAD/MI. Identifiable causes of hypertension include chronic renal disease.     Review of Systems  Constitutional: Negative for chills, fatigue, fever and unexpected weight change.  HENT: Negative for dental problem, mouth sores and trouble swallowing.   Eyes: Positive for blurred vision. Negative for visual disturbance.  Respiratory: Negative for cough, choking, chest tightness, shortness of breath and wheezing.   Cardiovascular: Negative for chest pain, palpitations and leg swelling.  Gastrointestinal: Negative for abdominal distention, abdominal pain, constipation, diarrhea, nausea and vomiting.  Endocrine: Positive for polydipsia and polyuria. Negative for polyphagia.  Genitourinary: Negative for dysuria, flank pain, hematuria and urgency.  Musculoskeletal: Negative for back pain, gait problem, myalgias and neck pain.  Skin: Negative for pallor, rash and wound.  Neurological: Negative for seizures, syncope, weakness, numbness and headaches.  Psychiatric/Behavioral: Negative for confusion and dysphoric mood.    Objective:     Vitals with BMI 11/12/2019 10/29/2019 07/16/2019  Height '6\' 2"'$  '6\' 2"'$  '6\' 2"'$   Weight 183 lbs 195 lbs 190 lbs  BMI 23.49 18.29 93.71  Systolic 696 789 381  Diastolic 82 75 81  Pulse 94 104 -  Some encounter information is confidential and restricted. Go to Review Flowsheets activity to see all data.    BP 136/82   Pulse 94   Ht '6\' 2"'$  (1.88 m)   Wt 183 lb (83 kg)   BMI 23.50 kg/m   Wt Readings from Last 3 Encounters:  11/12/19 183 lb (83 kg)  10/29/19 195 lb (88.5 kg)  07/16/19 190 lb (86.2 kg)     Physical Exam Constitutional:      General: He is not in acute distress.    Appearance: He is well-developed.  HENT:     Head: Normocephalic and atraumatic.  Neck:     Thyroid: No thyromegaly.     Trachea: No tracheal deviation.  Cardiovascular:     Rate and Rhythm: Normal rate.     Pulses:          Dorsalis pedis pulses are 1+ on the right side and 1+ on the left side.       Posterior tibial pulses are 1+ on the right side and 1+ on the left side.     Heart sounds: S1 normal and S2 normal. No murmur. No gallop.   Pulmonary:     Effort: Pulmonary effort is normal. No respiratory distress.     Breath sounds: No wheezing.  Abdominal:     General: There is no distension.     Tenderness: There is no abdominal tenderness. There is no guarding.  Musculoskeletal:     Right shoulder: No swelling or deformity.     Cervical back: Normal range of motion and neck supple.  Skin:    General: Skin is warm and dry.     Findings: No rash.  Nails: There is no clubbing.  Neurological:     Mental Status: He is alert and oriented to person, place, and time.     Cranial Nerves: No cranial nerve deficit.     Sensory: No sensory deficit.     Gait: Gait normal.     Deep Tendon Reflexes: Reflexes are normal and symmetric.  Psychiatric:        Speech: Speech normal.        Behavior: Behavior is cooperative.     Comments: Reluctant, unconcerned affect.  Patient seems to be distracted by social  stress.   CMP ( most recent) CMP     Component Value Date/Time   NA 127 (L) 10/29/2019 1408   K 4.6 10/29/2019 1408   CL 86 (L) 10/29/2019 1408   CO2 23 10/29/2019 1408   GLUCOSE 458 (HH) 10/29/2019 1408   GLUCOSE 180 (H) 04/02/2019 1612   BUN 15 10/29/2019 1408   CREATININE 1.50 (H) 10/29/2019 1408   CREATININE 0.97 05/07/2013 0955   CALCIUM 9.8 10/29/2019 1408   PROT 7.1 04/02/2019 1612   PROT 6.3 08/28/2017 1634   ALBUMIN 3.6 04/02/2019 1612   ALBUMIN 4.2 08/28/2017 1634   AST 15 04/02/2019 1612   ALT 22 04/02/2019 1612   ALKPHOS 55 04/02/2019 1612   BILITOT 0.7 04/02/2019 1612   BILITOT 0.4 08/28/2017 1634   GFRNONAA 52 (L) 10/29/2019 1408   GFRNONAA 77 03/19/2013 1131   GFRAA 60 10/29/2019 1408   GFRAA 89 03/19/2013 1131     Diabetic Labs (most recent): Lab Results  Component Value Date   HGBA1C >14.0 (H) 10/29/2019   HGBA1C >14.0 (H) 06/30/2019   HGBA1C 9.5 (H) 11/22/2018     Lipid Panel ( most recent) Lipid Panel     Component Value Date/Time   CHOL 166 01/15/2017 1036   CHOL 277 (H) 03/19/2013 1131   TRIG 376 (H) 01/15/2017 1036   TRIG 292 (H) 03/19/2013 1131   HDL 39 (L) 01/15/2017 1036   HDL 44 03/19/2013 1131   CHOLHDL 4.3 01/15/2017 1036   CHOLHDL 3.7 05/07/2013 0955   VLDL 39 05/07/2013 0955   LDLCALC 52 01/15/2017 1036   LDLCALC 175 (H) 03/19/2013 1131   LABVLDL 75 (H) 01/15/2017 1036      Lab Results  Component Value Date   TSH 0.882 12/10/2013   TSH 1.622 08/12/2012   TSH 2.584 01/22/2012   FREET4 1.44 08/12/2012       Assessment & Plan:   1. Type 2 diabetes mellitus with stage 3a chronic kidney disease, with long-term current use of insulin ([place])   - Dustin Cruz has currently uncontrolled symptomatic type 2 DM since  54 years of age,  with most recent A1c of >14 %. Recent labs reviewed. - I had a long discussion with him about the progressive nature of diabetes and the pathology behind its complications. -his diabetes is  complicated by coronary artery disease, CKD, chronic heavy smoking and he remains at a high risk for more acute and chronic complications which include CAD, CVA, CKD, retinopathy, and neuropathy. These are all discussed in detail with him.  - I have counseled him on diet   management  by adopting a carbohydrate restricted/protein rich diet. Patient is encouraged to switch to  unprocessed or minimally processed     complex starch and increased protein intake (animal or plant source), fruits, and vegetables. -  he is advised to stick to a routine mealtimes to eat 3  meals  a day and avoid unnecessary snacks ( to snack only to correct hypoglycemia).   - he admits that there is a room for improvement in his food and drink choices. - Suggestion is made for him to avoid simple carbohydrates  from his diet including Cakes, Sweet Desserts, Ice Cream, Soda (diet and regular), Sweet Tea, Candies, Chips, Cookies, Store Bought Juices, Alcohol in Excess of  1-2 drinks a day, Artificial Sweeteners,  Coffee Creamer, and "Sugar-free" Products. This will help patient to have more stable blood glucose profile and potentially avoid unintended weight gain.  - he will be scheduled with Jearld Fenton, RDN, CDE for diabetes education.  - I have approached him with the following individualized plan to manage  his diabetes and patient agrees:   -During his current and prevailing glycemic burden, this patient is a candidate for intensive treatment with basal/bolus insulin.  However, he has nonexistent social support, patient is dealing with social stress, unable to execute this treatment plan.  -Patient will need to be assessed for placement in skilled care facility or full-time aide at home. -Discussed and prescribed a meter for him.  I did not make any changes to his medications except for lowering his Metformin to 500 mg p.o. twice daily.  -Kept on his current dose of Lantus 60 units nightly, NovoLog 10 units 3 times daily  AC for premeal blood glucose readings above 90 mg per DL.  -He is warned not to take insulin without monitoring blood glucose properly.  -He is encouraged to call clinic for blood glucose readings less than 70 or greater than 300 mg per DL.  -He is approached to start strict monitoring of blood glucose 4 times a day-daily before meals and at bedtime. - he is not a candidate for full dose Metformin, SGLT2 S, incretin therapy.  -His chronic , heavy /active smoking puts him at risk for pancreatitis with incretin therapy.  - Specific targets for  A1c;  LDL, HDL,  and Triglycerides were discussed with the patient.  2) Blood Pressure /Hypertension:  his blood pressure is  controlled to target.   he is advised to continue his current medications including lisinopril 5 mg p.o. daily with breakfast . 3) Lipids/Hyperlipidemia:   Review of his recent lipid panel showed  controlled  LDL at 52 .  he  is advised to continue    atorvastatin 80 mg daily at bedtime.  Side effects and precautions discussed with him.  4)  Weight/Diet:  Body mass index is 23.5 kg/m.  -   he is not a candidate for weight loss.   Exercise, and detailed carbohydrates information provided  -  detailed on discharge instructions.  5) Chronic Care/Health Maintenance:  -he  is on ACEI/ARB and Statin medications and  is encouraged to initiate and continue to follow up with Ophthalmology, Dentist,  Podiatrist at least yearly or according to recommendations, and advised to  quit smoking. I have recommended yearly flu vaccine and pneumonia vaccine at least every 5 years; moderate intensity exercise for up to 150 minutes weekly; and  sleep for at least 7 hours a day.   The patient was counseled on the dangers of tobacco use, and was advised to quit.  Reviewed strategies to maximize success, including removing cigarettes and smoking materials from environment.  - he is  advised to maintain close follow up with [name], DO for  primary care needs, as well as his other providers for optimal and coordinated care.   -  Time spent in this patient care: 65 min, of which > 50% was spent in  counseling  him about his currently uncontrolled type 2 diabetes, hypertension, hyperlipidemia and the rest reviewing his blood glucose logs , discussing his hypoglycemia and hyperglycemia episodes, reviewing his current and  previous labs / studies  ( including abstraction from other facilities) and medications  doses and developing a  long term treatment plan based on the latest standards of care/ guidelines; and documenting his care.    Please refer to Patient Instructions for Blood Glucose Monitoring and Insulin/Medications Dosing Guide"  in media tab for additional information. Please  also refer to " Patient Self Inventory" in the Media  tab for reviewed elements of pertinent patient history.  Dustin Cruz participated in the discussions, expressed understanding, and voiced agreement with the above plans.  All questions were answered to his satisfaction. he is encouraged to contact clinic should he have any questions or concerns prior to his return visit.   Follow up plan: - Return in about 10 days (around 11/22/2019), or office, for Follow up with Meter and Logs Only - no Labs.  Glade Lloyd, MD [facility] [address] [place], [place] [address] Phone: [phone]  Fax: [phone]    11/12/2019, 4:45 PM  This note was partially dictated with voice recognition software. Similar sounding words can be transcribed inadequately or may not  be corrected upon review.

## 2019-11-12 NOTE — Patient Instructions (Signed)
                                     Advice for Weight Management  -For most of us the best way to lose weight is by diet management. Generally speaking, diet management means consuming less calories intentionally which over time brings about progressive weight loss.  This can be achieved more effectively by restricting carbohydrate consumption to the minimum possible.  So, it is critically important to know your numbers: how much calorie you are consuming and how much calorie you need. More importantly, our carbohydrates sources should be unprocessed or minimally processed complex starch food items.   Sometimes, it is important to balance nutrition by increasing protein intake (animal or plant source), fruits, and vegetables.  -Sticking to a routine mealtime to eat 3 meals a day and avoiding unnecessary snacks is shown to have a big role in weight control. Under normal circumstances, the only time we lose real weight is when we are hungry, so allow hunger to take place- hunger means no food between meal times, only water.  It is not advisable to starve.   -It is better to avoid simple carbohydrates including: Cakes, Sweet Desserts, Ice Cream, Soda (diet and regular), Sweet Tea, Candies, Chips, Cookies, Store Bought Juices, Alcohol in Excess of  1-2 drinks a day, Artificial Sweeteners, Doughnuts, Coffee Creamers, "Sugar-free" Products, etc, etc.  This is not a complete list.....    -Consulting with certified diabetes educators is proven to provide you with the most accurate and current information on diet.  Also, you may be  interested in discussing diet options/exchanges , we can schedule a visit with Penny Crumpton, RDN, CDE for individualized nutrition education.  -Exercise: If you are able: 30 -60 minutes a day ,4 days a week, or 150 minutes a week.  The longer the better.  Combine stretch, strength, and aerobic activities.  If you were told in the past that you  have high risk for cardiovascular diseases, you may seek evaluation by your heart doctor prior to initiating moderate to intense exercise programs.                                  Additional Care Considerations for Diabetes   -Diabetes  is a chronic disease.  The most important care consideration is regular follow-up with your diabetes care provider with the goal being avoiding or delaying its complications and to take advantage of advances in medications and technology.    -Type 2 diabetes is known to coexist with other important comorbidities such as high blood pressure and high cholesterol.  It is critical to control not only the diabetes but also the high blood pressure and high cholesterol to minimize and delay the risk of complications including coronary artery disease, stroke, amputations, blindness, etc.    - Studies showed that people with diabetes will benefit from a class of medications known as ACE inhibitors and statins.  Unless there are specific reasons not to be on these medications, the standard of care is to consider getting one from these groups of medications at an optimal doses.  These medications are generally considered safe and proven to help protect the heart and the kidneys.    - People with diabetes are encouraged to initiate and maintain regular follow-up with eye doctors, foot doctors, dentists ,   and if necessary heart and kidney doctors.     - It is highly recommended that people with diabetes quit smoking or stay away from smoking, and get yearly  flu vaccine and pneumonia vaccine at least every 5 years.  One other important lifestyle recommendation is to ensure adequate sleep - at least 6-7 hours of uninterrupted sleep at night.  -Exercise: If you are able: 30 -60 minutes a day, 4 days a week, or 150 minutes a week.  The longer the better.  Combine stretch, strength, and aerobic activities.  If you were told in the past that you have high risk for cardiovascular  diseases, you may seek evaluation by your heart doctor prior to initiating moderate to intense exercise programs.     COVID-19 Vaccine Information can be found at: [email or web address] For questions related to vaccine distribution or appointments, please email [email or web address] or call [phone].        

## 2019-11-12 NOTE — Progress Notes (Signed)
Walk in from Dr. Nida, Endocrinology. A1C > 14%.   Domestic problems causing stress and elevated blood sugars. LIves with girlfriend and looking to move out of her apartment. Will address safety questions next visit. .Hasn't met with a CDE/RDN before. Willing to make changes with diet and medications to help improve his DM. Smokes. Admits to not eating meals on time.  Lantus 60 units at night and Novolog 10 units with meals plus sliding scale. Metformin 500 mg BID. Needs medication compliance Elevated Creatine level noted and taking Metformin. BP meds were adjusted recently due to elevated Creatine per patient. Has lost 12 lbs recently. Most likely due to uncontrolled hyperglycemia.   Lab Results  Component Value Date   HGBA1C >14.0 (H) 10/29/2019   CMP Latest Ref Rng & Units 10/29/2019 06/30/2019 04/02/2019  Glucose 65 - 99 mg/dL 458(HH) 763(HH) -  BUN 6 - 24 mg/dL 15 14 -  Creatinine 0.76 - 1.27 mg/dL 1.50(H) 1.35(H) 1.30(H)  Sodium 134 - 144 mmol/L 127(L) 124(L) -  Potassium 3.5 - 5.2 mmol/L 4.6 4.8 -  Chloride 96 - 106 mmol/L 86(L) 87(L) -  CO2 20 - 29 mmol/L 23 22 -  Calcium 8.7 - 10.2 mg/dL 9.8 9.1 -  Total Protein 6.5 - 8.1 g/dL - - -  Total Bilirubin 0.3 - 1.2 mg/dL - - -  Alkaline Phos 38 - 126 U/L - - -  AST 15 - 41 U/L - - -  ALT 0 - 44 U/L - - -   Education provided: Nutrition and Diabetes education provided on My Plate, CHO counting, meal planning, portion sizes, timing of meals, avoiding snacks between meals unless having a low blood sugar, target ranges for A1C and blood sugars, signs/symptoms and treatment of hyper/hypoglycemia, monitoring blood sugars, taking medications as prescribed, benefits of exercising 30 minutes per day and prevention of complications of DM.  Goals Eat 3 balanced meals on time daily.  Do not skip meals Drink only water Avoid snacks Test blood sugars 4 times a day. Take medications as prescribed. Increase fresh fruits and vegetables. Cut  out processed foods.  Follow up in 1 month.  

## 2019-11-14 ENCOUNTER — Other Ambulatory Visit (HOSPITAL_COMMUNITY): Payer: Self-pay | Admitting: Nephrology

## 2019-11-14 DIAGNOSIS — N189 Chronic kidney disease, unspecified: Secondary | ICD-10-CM

## 2019-11-17 ENCOUNTER — Telehealth: Payer: Self-pay | Admitting: Family Medicine

## 2019-11-17 NOTE — Telephone Encounter (Signed)
Re-faxed.

## 2019-11-17 NOTE — Telephone Encounter (Signed)
This was done 10/29/2019.  Barnett Applebaum, can you check on this?

## 2019-11-19 ENCOUNTER — Ambulatory Visit (INDEPENDENT_AMBULATORY_CARE_PROVIDER_SITE_OTHER): Payer: Medicare Other | Admitting: *Deleted

## 2019-11-19 DIAGNOSIS — N1831 Chronic kidney disease, stage 3a: Secondary | ICD-10-CM

## 2019-11-19 DIAGNOSIS — E1121 Type 2 diabetes mellitus with diabetic nephropathy: Secondary | ICD-10-CM | POA: Diagnosis not present

## 2019-11-19 DIAGNOSIS — E785 Hyperlipidemia, unspecified: Secondary | ICD-10-CM | POA: Diagnosis not present

## 2019-11-19 DIAGNOSIS — G63 Polyneuropathy in diseases classified elsewhere: Secondary | ICD-10-CM

## 2019-11-19 DIAGNOSIS — I10 Essential (primary) hypertension: Secondary | ICD-10-CM | POA: Diagnosis not present

## 2019-11-19 DIAGNOSIS — F329 Major depressive disorder, single episode, unspecified: Secondary | ICD-10-CM | POA: Diagnosis not present

## 2019-11-19 NOTE — Chronic Care Management (AMB) (Signed)
Chronic Care Management   Follow Up Note   11/19/2019 Name: Dustin Cruz MRN: [id number] DOB: [birth year]  Referred by: [name], DO Reason for referral : Chronic Care Management (RN follow up)   Dustin Cruz is a 54 y.o. year old male who is a primary care patient of [name], DO. The CCM team was consulted for assistance with chronic disease management and care coordination needs.    Review of patient status, including review of consultants reports, relevant laboratory and other test results, and collaboration with appropriate care team members and the patient's provider was performed as part of comprehensive patient evaluation and provision of chronic care management services.    Outpatient Encounter Medications as of 11/19/2019  Medication Sig  . acetaminophen (TYLENOL) 325 MG tablet Take 2 tablets (650 mg total) by mouth every 6 (six) hours as needed for mild pain, moderate pain or fever (or Fever >/= 101).  [name] albuterol (PROVENTIL) (2.5 MG/3ML) 0.083% nebulizer solution Take 3 mLs (2.5 mg total) by nebulization every 6 (six) hours as needed for wheezing or shortness of breath. Dx---44.1  . albuterol (VENTOLIN HFA) 108 (90 Base) MCG/ACT inhaler 2 PUFFS INTO THE LUNGS EVERY 6 HOURS AS NEEDED FOR WHEEZING.  [name] apixaban (ELIQUIS) 5 MG TABS tablet Take 1 tablet (5 mg total) by mouth 2 (two) times daily.  [name] aspirin EC 81 MG tablet Take 1 tablet (81 mg total) by mouth daily with breakfast.  . atorvastatin (LIPITOR) 80 MG tablet Take 1 tablet (80 mg total) by mouth daily.  . Blood Glucose Monitoring Suppl (ACCU-CHEK GUIDE) w/Device KIT 1 Piece by Does not apply route as directed.  . busPIRone (BUSPAR) 7.5 MG tablet TAKE 1 TABLET BY MOUTH TWICE DAILY  . fenofibrate (TRICOR) 145 MG tablet Take 1 tablet (145 mg total) by mouth daily.  [name] gabapentin (NEURONTIN) 600 MG tablet Take 1 tablet (600 mg total) by mouth 3 (three) times daily.  [name] glucose blood (ACCU-CHEK GUIDE) test strip  Use as instructed  . insulin aspart (NOVOLOG FLEXPEN) 100 UNIT/ML FlexPen Inject 10 Units into the skin 3 (three) times daily before meals.  . Insulin Glargine (LANTUS SOLOSTAR) 100 UNIT/ML Solostar Pen Inject 60 Units into the skin every evening.  [name] lisinopril (ZESTRIL) 5 MG tablet Take 1 tablet (5 mg total) by mouth at bedtime.  . metFORMIN (GLUCOPHAGE) 500 MG tablet Take 1 tablet (500 mg total) by mouth 2 (two) times daily with a meal.  . nitroGLYCERIN (NITROSTAT) 0.4 MG SL tablet Place 1 tablet (0.4 mg total) under the tongue every 5 (five) minutes as needed. For chest pain.  [name] omeprazole (PRILOSEC) 40 MG capsule TAKE 1 CAPSULE BY MOUTH TWICE DAILY  . sertraline (ZOLOFT) 100 MG tablet Take 1 tablet (100 mg total) by mouth daily.  [name] tiZANidine (ZANAFLEX) 4 MG tablet TAKE 1/2 TO 1 TABLET(2 TO 4 MG) BY MOUTH EVERY 8 HOURS AS NEEDED FOR MUSCLE SPASMS  . traZODone (DESYREL) 150 MG tablet TAKE 1 TABLET BY MOUTH AT BEDTIME  . [DISCONTINUED] metoprolol tartrate (LOPRESSOR) 25 MG tablet Take 25 mg by mouth 2 (two) times daily.     No facility-administered encounter medications on file as of 11/19/2019.     Goals Addressed            This Visit's Progress     Patient Stated   . Leg Pain and Weakness (pt-stated)       Current Barriers:  [name] Knowledge Deficits related to  cause of and treatment for left leg weakness/numbness. . Film/video editor.  . Transportation barriers . Non-adherence to scheduled provider appointments   Nurse Case Manager Clinical Goal(s):  [name] Over the next 30 days, patient will work with RN CCM to discuss lower extremity pain/weakness.   Interventions:  . Evaluation of current treatment plan related to left leg/foot numbness and weakness. and patient's adherence to plan as established by provider. . Reviewed medications and discussed Neurontin 65m. He doesn't feel that this is helping.  . Discussed HPI o Patient reports leg weakness with ambulation and pain and  restlessness when resting at night. Unable to get comfortable and it affects his sleep even with Trazodone. . Chart reviewed. Normal Vascular studies 07/2019.  .[name] KitchenRN will collaborate with PCP regarding pain and and restlessness.  . Patient advised to reach out to PCP if symptoms worsen  Patient Self Care Activities:  . Self administers medications as prescribed . Calls pharmacy for medication refills . Performs ADL's independently  Please see past updates related to this goal by clicking on the "Past Updates" button in the selected goal        . Diabetes Management        Current Barriers:  . Chronic Disease Management support and education needs related to diabetes . Financial constraints . Housing   Nurse Case Manager Clinical Goal(s):  .[name] KitchenOver the next 14 days, patient will keep appointment with endocrinologist . Over the next 30 days, patient will check his blood sugar daily as directed and as needed . Over the next 90 days, patient will take his medication as prescribed  Interventions:  . Chart reviewed including recent office notes and labs . Talked with patient by telephone . Medications reviewed and discussed . Discussed blood sugar readings and encouraged patient to check and record blood sugar 4 times a day and PRN . Advised to reach out Dr NDorris Fetchwith any readings outside of recommended range . Verified upcoming new patient appointment with Dr NDorris Fetchfor 11/26/2019 . Discussed diabetic shoes. [facility]  o [place]  Patient Self Care Activities:  . Performs ADL's independently . Performs IADL's independently  Please see past updates related to this goal by clicking on the "Past Updates" button in the selected goal      . Improved knowledge of self care activities related to CKD Stage 3       Current Barriers:  .[name] KitchenKnowledge Deficits related to CKD disease  process and self health management plan* . FFilm/video editor   .  Nurse Case Manager Clinical Goal(s):  .[name] KitchenOver the next 60 days, patient will verbalize basic understanding of CKD disease process and self health management plan as evidenced by participation in establishment of long term plan of care   . Over the next 60 days, patient will keep all scheduled medical appointments   Interventions:  . Evaluation of current treatment plan related to CKD Stage 3 and patient's adherence to plan as established by provider. . Reviewed medications with patient and discussed safe use of over the counter analgesics for patient with CKD Stage 3   . Recommended to decrease soda intake. Drink water instead and follow provider recommended fluid restrictions.  . Chart reviewed including recent office notes. Nephrologist recommended that he may need to d/c metformin due to GFR . Reviewed upcoming appointments: Nephrology 12/26/2019  Patient Self Care  Activities:  . Self administers medications as prescribed . Performs ADL's independently . Performs IADL's independently  Please see past updates related to this goal by clicking on the "Past Updates" button in the selected goal          Follow-up Plan:   The care management team will reach out to the patient again over the next 30 days.    [name], BSN, RN-BC Embedded Chronic Care Manager [facility] Direct Dial: [phone]

## 2019-11-19 NOTE — Patient Instructions (Signed)
Visit Information  Goals Addressed            This Visit's Progress     Patient Stated   . COMPLETED: "I have some burning when I pee and sometimes I have a yellow drainage" (pt-stated)       Current Barriers:  [name] Knowledge Deficits related to cause and treatment of dysuria and discharge.  Nurse Case Manager Clinical Goal(s):  [name] Over the next 7 days, patient will verbalize understanding of plan for treatment of dysuria and discharge. . Over the next 3 days, patient will attend all scheduled medical appointments: Dr Lajuana Ripple 01/29/2019  Interventions:  . Advised patient to come in for exam and urine specimen on 01/29/2019. [name] Collaborated with LCSW regarding reported symptoms during their telephone call earlier today.  . Discussed plans with patient for ongoing care management follow up and provided patient with direct contact information for care management team  Patient Self Care Activities:  . Self administers medications as prescribed . Attends all scheduled provider appointments . Calls pharmacy for medication refills . Performs ADL's independently . Performs IADL's independently . Calls provider office for new concerns or questions  Initial goal documentation      . Leg Pain and Weakness (pt-stated)       Current Barriers:  [name] Knowledge Deficits related to cause of and treatment for left leg weakness/numbness. . Film/video editor.  . Transportation barriers . Non-adherence to scheduled provider appointments   Nurse Case Manager Clinical Goal(s):  [name] Over the next 30 days, patient will work with RN CCM to discuss lower extremity pain/weakness.   Interventions:  . Evaluation of current treatment plan related to left leg/foot numbness and weakness. and patient's adherence to plan as established by provider. . Reviewed medications and discussed Neurontin 600mg . He doesn't feel that this is helping.  . Discussed HPI o Patient reports leg weakness with ambulation and  pain and restlessness when resting at night. Unable to get comfortable and it affects his sleep even with Trazodone. . Chart reviewed. Normal Vascular studies 07/2019.  [name] RN will collaborate with PCP regarding pain and and restlessness.  . Patient advised to reach out to PCP if symptoms worsen  Patient Self Care Activities:  . Self administers medications as prescribed . Calls pharmacy for medication refills . Performs ADL's independently  Please see past updates related to this goal by clicking on the "Past Updates" button in the selected goal          Other   . Diabetes Management        Current Barriers:  . Chronic Disease Management support and education needs related to diabetes . Financial constraints . Housing   Nurse Case Manager Clinical Goal(s):  [name] Over the next 14 days, patient will keep appointment with endocrinologist . Over the next 30 days, patient will check his blood sugar daily as directed and as needed . Over the next 90 days, patient will take his medication as prescribed  Interventions:  . Chart reviewed including recent office notes and labs . Talked with patient by telephone . Medications reviewed and discussed . Discussed blood sugar readings and encouraged patient to check and record blood sugar 4 times a day and PRN . Advised to reach out Dr Dorris Fetch with any readings outside of recommended range . Verified upcoming new patient appointment with Dr Dorris Fetch for 11/26/2019 . Discussed diabetic shoes. Advised that order was [facility] to [facility] on 11/17/2019.  o RN will reach out to  [place] to follow-up on diabetic shoe order  Patient Self Care Activities:  . Performs ADL's independently . Performs IADL's independently  Please see past updates related to this goal by clicking on the "Past Updates" button in the selected goal      . Improved knowledge of self care activities related to CKD Stage 3       Current Barriers:  [name] Knowledge Deficits  related to CKD disease process and self health management plan* . Film/video editor.   .  Nurse Case Manager Clinical Goal(s):  [name] Over the next 60 days, patient will verbalize basic understanding of CKD disease process and self health management plan as evidenced by participation in establishment of long term plan of care   . Over the next 60 days, patient will keep all scheduled medical appointments   Interventions:  . Evaluation of current treatment plan related to CKD Stage 3 and patient's adherence to plan as established by provider. . Reviewed medications with patient and discussed safe use of over the counter analgesics for patient with CKD Stage 3   . Recommended to decrease soda intake. Drink water instead and follow provider recommended fluid restrictions.  . Chart reviewed including recent office notes. Nephrologist recommended that he may need to d/c metformin due to GFR . Reviewed upcoming appointments: Nephrology 12/26/2019  Patient Self Care Activities:  . Self administers medications as prescribed . Performs ADL's independently . Performs IADL's independently  Please see past updates related to this goal by clicking on the "Past Updates" button in the selected goal         The care management team will reach out to the patient again over the next 30 days.   [name], BSN, RN-BC Embedded Chronic Care Manager [facility] Direct Dial: [phone]   The patient verbalized understanding of instructions provided today and declined a print copy of patient instruction materials.

## 2019-11-21 ENCOUNTER — Other Ambulatory Visit: Payer: Self-pay | Admitting: Family Medicine

## 2019-11-21 DIAGNOSIS — G2581 Restless legs syndrome: Secondary | ICD-10-CM

## 2019-11-21 DIAGNOSIS — G63 Polyneuropathy in diseases classified elsewhere: Secondary | ICD-10-CM

## 2019-11-24 ENCOUNTER — Encounter: Payer: Self-pay | Admitting: Nutrition

## 2019-11-24 NOTE — Patient Instructions (Addendum)
  Goals Eat 3 balanced meals on time daily.  Do not skip meals Drink only water Avoid snacks Test blood sugars 4 times a day. Take medications as prescribed. Increase fresh fruits and vegetables. Cut out processed foods.

## 2019-11-26 ENCOUNTER — Ambulatory Visit: Payer: Medicare Other | Admitting: Nutrition

## 2019-11-26 ENCOUNTER — Other Ambulatory Visit: Payer: Self-pay

## 2019-11-26 ENCOUNTER — Ambulatory Visit: Payer: Medicare Other | Admitting: "Endocrinology

## 2019-11-26 ENCOUNTER — Ambulatory Visit (HOSPITAL_COMMUNITY)
Admission: RE | Admit: 2019-11-26 | Discharge: 2019-11-26 | Disposition: A | Discharge status: Home / Self Care | Payer: Medicare Other | Source: Ambulatory Visit | Attending: Nephrology | Admitting: Nephrology

## 2019-11-26 DIAGNOSIS — N189 Chronic kidney disease, unspecified: Secondary | ICD-10-CM | POA: Insufficient documentation

## 2019-11-29 DIAGNOSIS — E1142 Type 2 diabetes mellitus with diabetic polyneuropathy: Secondary | ICD-10-CM | POA: Diagnosis not present

## 2019-12-02 ENCOUNTER — Ambulatory Visit: Payer: Self-pay | Admitting: Licensed Clinical Social Worker

## 2019-12-02 DIAGNOSIS — F329 Major depressive disorder, single episode, unspecified: Secondary | ICD-10-CM | POA: Diagnosis not present

## 2019-12-02 DIAGNOSIS — N1831 Chronic kidney disease, stage 3a: Secondary | ICD-10-CM

## 2019-12-02 DIAGNOSIS — E1122 Type 2 diabetes mellitus with diabetic chronic kidney disease: Secondary | ICD-10-CM

## 2019-12-02 DIAGNOSIS — F32A Depression, unspecified: Secondary | ICD-10-CM

## 2019-12-02 DIAGNOSIS — I10 Essential (primary) hypertension: Secondary | ICD-10-CM

## 2019-12-02 DIAGNOSIS — Z794 Long term (current) use of insulin: Secondary | ICD-10-CM

## 2019-12-02 DIAGNOSIS — E1121 Type 2 diabetes mellitus with diabetic nephropathy: Secondary | ICD-10-CM

## 2019-12-02 DIAGNOSIS — E785 Hyperlipidemia, unspecified: Secondary | ICD-10-CM | POA: Diagnosis not present

## 2019-12-02 DIAGNOSIS — K219 Gastro-esophageal reflux disease without esophagitis: Secondary | ICD-10-CM

## 2019-12-02 NOTE — Chronic Care Management (AMB) (Signed)
  Care Management Note   Dustin Cruz is a 54 y.o. year old male who is a primary care patient of Dustin Norlander, DO. The CM team was consulted for assistance with chronic disease management and care coordination.   I reached out to Dustin Cruz by phone today.    Review of patient status, including review of consultants reports, relevant laboratory and other test results, and collaboration with appropriate care team members and the patient's provider was performed as part of comprehensive patient evaluation and provision of chronic care management services.  Social determinants of health: risk of tobacco use; risk of depression    Office Visit from 10/29/2019 in [place]  PHQ-9 Total Score  0     GAD 7 : Generalized Anxiety Score 11/22/2018  Nervous, Anxious, on Edge 0  Control/stop worrying 0  Worry too much - different things 0  Trouble relaxing 0  Restless 0  Easily annoyed or irritable 0  Afraid - awful might happen 0  Total GAD 7 Score 0  Anxiety Difficulty Not difficult at all   Medications   (very important)  New medications from outside sources are available for reconciliation  acetaminophen (TYLENOL) 325 MG tablet albuterol (PROVENTIL) (2.5 MG/3ML) 0.083% nebulizer solution albuterol (VENTOLIN HFA) 108 (90 Base) MCG/ACT inhaler apixaban (ELIQUIS) 5 MG TABS tablet aspirin EC 81 MG tablet atorvastatin (LIPITOR) 80 MG tablet Blood Glucose Monitoring Suppl (ACCU-CHEK GUIDE) w/Device KIT busPIRone (BUSPAR) 7.5 MG tablet fenofibrate (TRICOR) 145 MG tablet gabapentin (NEURONTIN) 600 MG tablet glucose blood (ACCU-CHEK GUIDE) test strip insulin aspart (NOVOLOG FLEXPEN) 100 UNIT/ML FlexPen Insulin Glargine (LANTUS SOLOSTAR) 100 UNIT/ML Solostar Pen lisinopril (ZESTRIL) 5 MG tablet metFORMIN (GLUCOPHAGE) 500 MG tablet nitroGLYCERIN (NITROSTAT) 0.4 MG SL tablet omeprazole (PRILOSEC) 40 MG capsule sertraline (ZOLOFT) 100 MG tablet tiZANidine  (ZANAFLEX) 4 MG tablet traZODone (DESYREL) 150 MG tablet  Goals    . Client said he had dental care issues and needs to see dentist for care (pt-stated)     Barriers:  Dental pain issues in patient with Chronic Diagnoses of HTN, Depression, GERD, DM Type 2, and HLD  Financial Challenges  Clinical Social Work Goal: Over next 30 days , client  will work with LCSW to address concerns related to dental care needs of client  Interventions: LCSW encouraged client to communicate with RNCM regarding nursing needs of client Talked with client about current dental needs of client and dental resources for client in the area Talked with client about housing needs of client Talked with client about sleeping challenges of client Talked with client about pain issues of client Talked with Dustin Cruz about his upcoming appointments Talked with client about transport needs of client Talked with client about [facility]  Patient Self Care Activities: Self administers medications as prescribed Attends all scheduled provider appointments Performs ADL's independently  Plan: Client to call LCSW as needed to discuss psychosocial needs of client LCSW to call client in next 4 weeks to discuss current client needs including dental needs of client Client to communicate with RN CM to discuss nursing needs of client.       Follow Up Plan: LCSW to call client in next 4 weeks to assess current client needs, including the dental needs of client  Dustin Cruz.Dustin Cruz MSW, LCSW Licensed Clinical Social Worker [facility] Care Management [phone]

## 2019-12-02 NOTE — Patient Instructions (Addendum)
Licensed Clinical Social Worker Visit Information  Goals we discussed today:  Goals    . Client said he had dental care issues and needs to see dentist for care (pt-stated)     Barriers:  Dental pain issues in patient with Chronic Diagnoses of HTN, Depression, GERD, DM Type 2, and HLD  Financial Challenges  Clinical Social Work Goal: Over next 30 days , client  will work with LCSW to address concerns related to dental care needs of client  Interventions: LCSW encouraged client to communicate with RNCM regarding nursing needs of client Talked with client about current dental needs of client and dental resources for client in the area Talked with client about housing needs of client Talked with client about sleeping challenges of client Talked with client about pain issues of client Talked with [place] about his upcoming appointments Talked with client about transport needs of client Talked with client about [facility] Dual Complete program  Patient Self Care Activities: Self administers medications as prescribed Attends all scheduled provider appointments Performs ADL's independently  Plan: Client to call LCSW as needed to discuss psychosocial needs of client LCSW to call client in next 4 weeks to discuss current client needs including dental needs of client Client to communicate with RN CM to discuss nursing needs of client.        Materials Provided: No  Follow Up Plan:  LCSW to call client in next 4 weeks to discuss current client needs  including dental needs of client   The patient verbalized understanding of instructions provided today and declined a print copy of patient instruction materials.   Norva Riffle.Gwynn Crossley MSW, LCSW Licensed Clinical Social Worker [facility] [phone]

## 2019-12-06 ENCOUNTER — Other Ambulatory Visit: Payer: Self-pay | Admitting: Family Medicine

## 2019-12-10 ENCOUNTER — Other Ambulatory Visit: Payer: Self-pay | Admitting: Family Medicine

## 2019-12-10 DIAGNOSIS — R059 Cough, unspecified: Secondary | ICD-10-CM

## 2019-12-10 DIAGNOSIS — R05 Cough: Secondary | ICD-10-CM

## 2019-12-10 NOTE — Telephone Encounter (Signed)
Last office visit 10/29/2019 Last refill 10/13/2019, #30, 2 refills

## 2019-12-18 ENCOUNTER — Encounter: Payer: Self-pay | Admitting: Family Medicine

## 2019-12-18 ENCOUNTER — Ambulatory Visit (INDEPENDENT_AMBULATORY_CARE_PROVIDER_SITE_OTHER): Payer: Medicare Other | Admitting: Family Medicine

## 2019-12-18 DIAGNOSIS — L239 Allergic contact dermatitis, unspecified cause: Secondary | ICD-10-CM | POA: Diagnosis not present

## 2019-12-18 DIAGNOSIS — R21 Rash and other nonspecific skin eruption: Secondary | ICD-10-CM

## 2019-12-18 MED ORDER — HYDROXYZINE PAMOATE 25 MG PO CAPS: 25.0000 mg | ORAL_CAPSULE | Freq: Three times a day (TID) | ORAL | 0 refills | Status: DC | PRN

## 2019-12-18 MED ORDER — TRIAMCINOLONE ACETONIDE 0.1 % EX CREA: 1.0000 "application " | TOPICAL_CREAM | Freq: Two times a day (BID) | CUTANEOUS | 0 refills | Status: DC

## 2019-12-18 NOTE — Progress Notes (Signed)
Virtual Visit via telephone Note Due to COVID-19 pandemic this visit was conducted virtually. This visit type was conducted due to national recommendations for restrictions regarding the COVID-19 Pandemic (e.g. social distancing, sheltering in place) in an effort to limit this patient's exposure and mitigate transmission in our community. All issues noted in this document were discussed and addressed.  A physical exam was not performed with this format.   I connected with Dustin Cruz on 12/18/2019 at 1445 by telephone and verified that I am speaking with the correct person using two identifiers. Dustin Cruz is currently located at home and no one is currently with them during visit. The provider, Monia Pouch, FNP is located in their office at time of visit.  I discussed the limitations, risks, security and privacy concerns of performing an evaluation and management service by telephone and the availability of in person appointments. I also discussed with the patient that there may be a patient responsible charge related to this service. The patient expressed understanding and agreed to proceed.  Subjective:  Patient ID: Dustin Cruz, male    DOB: July 10, 1966, 54 y.o.   MRN: [id number]  Chief Complaint:  Rash   HPI: Dustin Cruz is a 54 y.o. male presenting on 12/18/2019 for Rash   Patient reports red, raised, pruritic rash to bilateral arms.  Patient states this started yesterday evening.  He has tried Benadryl without relief of symptoms.  He denies any facial swelling, throat swelling, tongue swelling, voice changes, shortness of breath, or palpitations.  No known exposures.  No new household or hygiene products.  No new pets in the home.  He describes the rash as round, raised, reddened areas.  No drainage or openings to the rash.  Rash This is a new problem. The current episode started yesterday. The problem is unchanged. The affected locations include the left arm and right arm. The rash  is characterized by itchiness and redness. It is unknown if there was an exposure to a precipitant. Pertinent negatives include no anorexia, congestion, cough, diarrhea, eye pain, facial edema, fatigue, fever, joint pain, nail changes, rhinorrhea, shortness of breath, sore throat or vomiting. Past treatments include antihistamine. The treatment provided no relief.     Relevant past medical, surgical, family, and social history reviewed and updated as indicated.  Allergies and medications reviewed and updated.   Past Medical History:  Diagnosis Date  . Anxiety   . Asthma   . CKD stage 3 due to type 2 diabetes mellitus ([place])   . COPD (chronic obstructive pulmonary disease) ([place])   . Coronary artery disease    s/p BMS to Ramus 10/2010;  Cath 01/22/12 prox 30-40% LAD, LCx ramus w/ hazy 70-80% in-stent restenosis, EF 60% treated medically  . Coronary artery disease    s/p BMS to Ramus 10/2010;  Cath 01/22/12 prox 30-40% LAD, LCx ramus w/ hazy 70-80% in-stent restenosis, EF 60% treated medically   . Depression   . Diabetes mellitus    Type 2  . Gastroparesis 05/2013  . GERD (gastroesophageal reflux disease)   . HTN (hypertension)   . Hyperlipidemia   . Hyperlipidemia   . Major depression, chronic 08/13/2012  . Melanoma ([place]) 2007   surgery at [facility], Followed by [facility]  . Myocardial infarction ([place]) 2012  . Obesity   . Tobacco abuse   . Tubular adenoma   . Urinary retention     Past Surgical History:  Procedure Laterality Date  . BIOPSY  01/07/2018   Procedure: BIOPSY;  Surgeon: Daneil Dolin, MD;  [facility];  Service: Endoscopy;;  duodenal and gastric biopsy  . BIOPSY  04/25/2018   Procedure: BIOPSY;  Surgeon: Daneil Dolin, MD;  Location: [facility];  Service: Endoscopy;;  ascending and descending and sigmoid biopsies  . CARDIAC CATHETERIZATION     with stent  . COLONOSCOPY N/A 01/27/2013   TFT:DDUKGU and colonic polyps. Tubular adenomas, poor bowel prep,  one-year follow-up surveillance colonoscopy recommended  . COLONOSCOPY WITH PROPOFOL N/A 04/25/2018   Procedure: COLONOSCOPY WITH PROPOFOL;  Surgeon: Daneil Dolin, MD;  Location: [facility];  Service: Endoscopy;  Laterality: N/A;  11:00am - pt to be prepped INPT and labs to be done as well  . CORONARY ANGIOPLASTY WITH STENT PLACEMENT    . ESOPHAGOGASTRODUODENOSCOPY  04/24/2012   [place]-mild erosive reflux esophagitis,dilated w/74F Venia Minks, small HH, minimal chronic gastric/bulbar erosions(No H pylori)  . ESOPHAGOGASTRODUODENOSCOPY (EGD) WITH PROPOFOL N/A 01/07/2018   Dr. Gala Romney: Erythematous mucosa in the stomach, retained gastric contents, incomplete exam.  Biopsied showed mild chronic gastritis but no H. pylori.  Duodenal biopsy was negative for celiac disease.  [name] FLEXIBLE SIGMOIDOSCOPY N/A 01/07/2018   Dr. Gala Romney: Incomplete colonoscopy due to inadequate bowel prep  . LEFT HEART CATHETERIZATION WITH CORONARY ANGIOGRAM N/A 01/22/2012   Procedure: LEFT HEART CATHETERIZATION WITH CORONARY ANGIOGRAM;  Surgeon: Jolaine Artist, MD;  Location: [facility] CATH LAB;  Service: Cardiovascular;  Laterality: N/A;  . melanoma surgery  2007   [place], removed lymph nodes under arm as well,Left abd    Social History   Socioeconomic History  . Marital status: Divorced    Spouse name: rayann Aoun  . Number of children: 0  . Years of education: Not on file  . Highest education level: Not on file  Occupational History  . Occupation: disabled  Tobacco Use  . Smoking status: Current Every Day Smoker    Packs/day: 1.00    Years: 31.00    Tawil years: 31.00    Types: Cigarettes    Start date: 10/09/1981  . Smokeless tobacco: Never Used  . Tobacco comment: 1/2 [place] daily 11/01/17  Substance and Sexual Activity  . 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  . Drug use: No  . Sexual activity:  Yes  Other Topics Concern  . Not on file  Social History Narrative   Lives at home in an apartment alone. He is divorced and does not have any children. He has two brothers but is estranged from one. He only talks with the other one infrequently. He does have contact with his ex wife and they have a good relationship. They help each other out.    Social Determinants of Health   Financial Resource Strain:   . Difficulty of Paying Living Expenses:   Food Insecurity:   . Worried About Charity fundraiser in the Last Year:   . Arboriculturist in the Last Year:   Transportation Needs:   . Film/video editor (Medical):   [name] Lack of Transportation (Non-Medical):   Physical Activity:   . Days of Exercise per Week:   . Minutes of Exercise per Session:   Stress:   . Feeling of Stress :   Social Connections:   . Frequency of Communication with Friends and Family:   .  Frequency of Social Gatherings with Friends and Family:   . Attends Religious Services:   . Active Member of Clubs or Organizations:   . Attends Archivist Meetings:   [name] Marital Status:   Intimate Partner Violence:   . Fear of Current or Ex-Partner:   . Emotionally Abused:   [name] Physically Abused:   . Sexually Abused:     Outpatient Encounter Medications as of 12/18/2019  Medication Sig  . acetaminophen (TYLENOL) 325 MG tablet Take 2 tablets (650 mg total) by mouth every 6 (six) hours as needed for mild pain, moderate pain or fever (or Fever >/= 101).  [name] albuterol (PROVENTIL) (2.5 MG/3ML) 0.083% nebulizer solution Take 3 mLs (2.5 mg total) by nebulization every 6 (six) hours as needed for wheezing or shortness of breath. Dx---44.1  . albuterol (VENTOLIN HFA) 108 (90 Base) MCG/ACT inhaler 2 PUFFS INTO THE LUNGS EVERY 6 HOURS AS NEEDED FOR WHEEZING.  [name] apixaban (ELIQUIS) 5 MG TABS tablet Take 1 tablet (5 mg total) by mouth 2 (two) times daily.  [name] aspirin EC 81 MG tablet Take 1 tablet (81 mg total) by mouth daily  with breakfast.  . atorvastatin (LIPITOR) 80 MG tablet Take 1 tablet (80 mg total) by mouth daily.  . Blood Glucose Monitoring Suppl (ACCU-CHEK GUIDE) w/Device KIT 1 Piece by Does not apply route as directed.  . busPIRone (BUSPAR) 7.5 MG tablet TAKE 1 TABLET BY MOUTH TWICE DAILY  . fenofibrate (TRICOR) 145 MG tablet Take 1 tablet (145 mg total) by mouth daily.  [name] gabapentin (NEURONTIN) 600 MG tablet TAKE 1 TABLET(600 MG) BY MOUTH THREE TIMES DAILY  . glucose blood (ACCU-CHEK GUIDE) test strip Use as instructed  . hydrOXYzine (VISTARIL) 25 MG capsule Take 1 capsule (25 mg total) by mouth 3 (three) times daily as needed.  . insulin aspart (NOVOLOG FLEXPEN) 100 UNIT/ML FlexPen Inject 10 Units into the skin 3 (three) times daily before meals.  . Insulin Glargine (LANTUS SOLOSTAR) 100 UNIT/ML Solostar Pen Inject 60 Units into the skin every evening.  [name] lisinopril (ZESTRIL) 5 MG tablet TAKE 1 TABLET AT BEDTIME  . metFORMIN (GLUCOPHAGE) 500 MG tablet Take 1 tablet (500 mg total) by mouth 2 (two) times daily with a meal.  . nitroGLYCERIN (NITROSTAT) 0.4 MG SL tablet Place 1 tablet (0.4 mg total) under the tongue every 5 (five) minutes as needed. For chest pain.  [name] omeprazole (PRILOSEC) 40 MG capsule TAKE 1 CAPSULE BY MOUTH TWICE DAILY  . sertraline (ZOLOFT) 100 MG tablet Take 1 tablet (100 mg total) by mouth daily.  [name] tiZANidine (ZANAFLEX) 4 MG tablet TAKE 1/2 TO 1 TABLET BY MOUTH EVERY 8 HOURS AS NEEDED FOR MUSCLE SPASMS.  [name] traZODone (DESYREL) 150 MG tablet TAKE 1 TABLET BY MOUTH AT BEDTIME  . triamcinolone cream (KENALOG) 0.1 % Apply 1 application topically 2 (two) times daily.  . [DISCONTINUED] metoprolol tartrate (LOPRESSOR) 25 MG tablet Take 25 mg by mouth 2 (two) times daily.     No facility-administered encounter medications on file as of 12/18/2019.    No Known Allergies  Review of Systems  Constitutional: Negative for activity change, appetite change, chills, diaphoresis, fatigue, fever  and unexpected weight change.  HENT: Negative.  Negative for congestion, drooling, facial swelling, rhinorrhea, sore throat, trouble swallowing and voice change.   Eyes: Negative.  Negative for photophobia, pain, discharge, redness, itching and visual disturbance.  Respiratory: Negative for apnea, cough, choking, chest tightness, shortness of breath, wheezing and stridor.  Cardiovascular: Negative for chest pain, palpitations and leg swelling.  Gastrointestinal: Negative for abdominal pain, anorexia, blood in stool, constipation, diarrhea, nausea and vomiting.  Endocrine: Negative.   Genitourinary: Negative for decreased urine volume, difficulty urinating, dysuria, frequency and urgency.  Musculoskeletal: Negative for arthralgias, joint pain and myalgias.  Skin: Positive for color change and rash. Negative for nail changes, pallor and wound.  Allergic/Immunologic: Negative.   Neurological: Negative for dizziness, tremors, seizures, syncope, facial asymmetry, speech difficulty, weakness, light-headedness, numbness and headaches.  Hematological: Negative.   Psychiatric/Behavioral: Negative for confusion, hallucinations, sleep disturbance and suicidal ideas.  All other systems reviewed and are negative.        Observations/Objective: No vital signs or physical exam, this was a telephone or virtual health encounter.  Pt alert and oriented, answers all questions appropriately, and able to speak in full sentences.    Assessment and Plan: Dustin Cruz was seen today for rash.  Diagnoses and all orders for this visit:  Rash in adult Reported symptoms consistent with contact dermatitis due to unknown cause.  No red flags concerning for angioedema or anaphylaxis.  Patient is diabetic so will place on topical triamcinolone cream and oral Vistaril as needed for pruritus.  Patient aware to take Claritin or Zyrtec nightly.  Patient aware of red flags that require emergent evaluation and treatment.   Patient aware to report any new, worsening, or persistent symptoms. -     hydrOXYzine (VISTARIL) 25 MG capsule; Take 1 capsule (25 mg total) by mouth 3 (three) times daily as needed. -     triamcinolone cream (KENALOG) 0.1 %; Apply 1 application topically 2 (two) times daily.     Follow Up Instructions: Return if symptoms worsen or fail to improve.    I discussed the assessment and treatment plan with the patient. The patient was provided an opportunity to ask questions and all were answered. The patient agreed with the plan and demonstrated an understanding of the instructions.   The patient was advised to call back or seek an in-person evaluation if the symptoms worsen or if the condition fails to improve as anticipated.  The above assessment and management plan was discussed with the patient. The patient verbalized understanding of and has agreed to the management plan. Patient is aware to call the clinic if they develop any new symptoms or if symptoms persist or worsen. Patient is aware when to return to the clinic for a follow-up visit. Patient educated on when it is appropriate to go to the emergency department.    I provided 15 minutes of non-face-to-face time during this encounter. The call started at 1445. The call ended at 1500. The other time was used for coordination of care.    Monia Pouch, FNP-C [facility] [address] [place], [place] [address] [phone] 12/18/2019

## 2019-12-21 DIAGNOSIS — E0865 Diabetes mellitus due to underlying condition with hyperglycemia: Secondary | ICD-10-CM | POA: Diagnosis not present

## 2019-12-21 DIAGNOSIS — L500 Allergic urticaria: Secondary | ICD-10-CM | POA: Diagnosis not present

## 2019-12-21 DIAGNOSIS — T7840XA Allergy, unspecified, initial encounter: Secondary | ICD-10-CM | POA: Diagnosis not present

## 2019-12-27 ENCOUNTER — Other Ambulatory Visit: Payer: Self-pay

## 2019-12-27 ENCOUNTER — Encounter (HOSPITAL_COMMUNITY): Payer: Self-pay | Admitting: Emergency Medicine

## 2019-12-27 ENCOUNTER — Emergency Department (HOSPITAL_COMMUNITY): Payer: Medicare Other

## 2019-12-27 ENCOUNTER — Emergency Department (HOSPITAL_COMMUNITY)
Admission: EM | Admit: 2019-12-27 | Discharge: 2019-12-27 | Disposition: A | Discharge status: Home / Self Care | Payer: Medicare Other | Attending: Emergency Medicine | Admitting: Emergency Medicine

## 2019-12-27 DIAGNOSIS — J441 Chronic obstructive pulmonary disease with (acute) exacerbation: Secondary | ICD-10-CM | POA: Insufficient documentation

## 2019-12-27 DIAGNOSIS — Z7982 Long term (current) use of aspirin: Secondary | ICD-10-CM | POA: Diagnosis not present

## 2019-12-27 DIAGNOSIS — E1165 Type 2 diabetes mellitus with hyperglycemia: Secondary | ICD-10-CM | POA: Diagnosis not present

## 2019-12-27 DIAGNOSIS — I130 Hypertensive heart and chronic kidney disease with heart failure and stage 1 through stage 4 chronic kidney disease, or unspecified chronic kidney disease: Secondary | ICD-10-CM | POA: Diagnosis not present

## 2019-12-27 DIAGNOSIS — N183 Chronic kidney disease, stage 3 unspecified: Secondary | ICD-10-CM | POA: Diagnosis not present

## 2019-12-27 DIAGNOSIS — Z794 Long term (current) use of insulin: Secondary | ICD-10-CM | POA: Diagnosis not present

## 2019-12-27 DIAGNOSIS — R739 Hyperglycemia, unspecified: Secondary | ICD-10-CM

## 2019-12-27 DIAGNOSIS — Z20822 Contact with and (suspected) exposure to covid-19: Secondary | ICD-10-CM | POA: Insufficient documentation

## 2019-12-27 DIAGNOSIS — R05 Cough: Secondary | ICD-10-CM | POA: Diagnosis not present

## 2019-12-27 DIAGNOSIS — I2510 Atherosclerotic heart disease of native coronary artery without angina pectoris: Secondary | ICD-10-CM | POA: Diagnosis not present

## 2019-12-27 DIAGNOSIS — Z79899 Other long term (current) drug therapy: Secondary | ICD-10-CM | POA: Diagnosis not present

## 2019-12-27 DIAGNOSIS — I509 Heart failure, unspecified: Secondary | ICD-10-CM | POA: Diagnosis not present

## 2019-12-27 DIAGNOSIS — R0602 Shortness of breath: Secondary | ICD-10-CM | POA: Diagnosis not present

## 2019-12-27 DIAGNOSIS — Z955 Presence of coronary angioplasty implant and graft: Secondary | ICD-10-CM | POA: Insufficient documentation

## 2019-12-27 DIAGNOSIS — F17210 Nicotine dependence, cigarettes, uncomplicated: Secondary | ICD-10-CM | POA: Diagnosis not present

## 2019-12-27 LAB — BASIC METABOLIC PANEL
Anion gap: 10 (ref 5–15)
BUN: 15 mg/dL (ref 6–20)
CO2: 25 mmol/L (ref 22–32)
Calcium: 8.7 mg/dL — ABNORMAL LOW (ref 8.9–10.3)
Chloride: 94 mmol/L — ABNORMAL LOW (ref 98–111)
Creatinine, Ser: 1.13 mg/dL (ref 0.61–1.24)
GFR calc Af Amer: >60 mL/min (ref >60)
GFR calc non Af Amer: >60 mL/min (ref >60)
Glucose, Bld: 484 mg/dL — ABNORMAL HIGH (ref 70–99)
Potassium: 4.3 mmol/L (ref 3.5–5.1)
Sodium: 129 mmol/L — ABNORMAL LOW (ref 135–145)

## 2019-12-27 LAB — TROPONIN I (HIGH SENSITIVITY): Troponin I (High Sensitivity): 17 ng/L (ref <18)

## 2019-12-27 LAB — CBC WITH DIFFERENTIAL/PLATELET
Abs Immature Granulocytes: 0.01 10*3/uL (ref 0.00–0.07)
Basophils Absolute: 0.1 10*3/uL (ref 0.0–0.1)
Basophils Relative: 1 %
Eosinophils Absolute: 0.1 10*3/uL (ref 0.0–0.5)
Eosinophils Relative: 1 %
HCT: 50.7 % (ref 39.0–52.0)
Hemoglobin: 17.4 g/dL — ABNORMAL HIGH (ref 13.0–17.0)
Immature Granulocytes: 0 %
Lymphocytes Relative: 18 %
Lymphs Abs: 1.3 10*3/uL (ref 0.7–4.0)
MCH: 32.5 pg (ref 26.0–34.0)
MCHC: 34.3 g/dL (ref 30.0–36.0)
MCV: 94.6 fL (ref 80.0–100.0)
Monocytes Absolute: 0.7 10*3/uL (ref 0.1–1.0)
Monocytes Relative: 9 %
Neutro Abs: 5.4 10*3/uL (ref 1.7–7.7)
Neutrophils Relative %: 71 %
Platelets: 328 10*3/uL (ref 150–400)
RBC: 5.36 MIL/uL (ref 4.22–5.81)
RDW: 12.8 % (ref 11.5–15.5)
WBC: 7.5 10*3/uL (ref 4.0–10.5)
nRBC: 0 % (ref 0.0–0.2)

## 2019-12-27 LAB — RESPIRATORY PANEL BY RT PCR (FLU A&B, COVID)
Influenza A by PCR: NEGATIVE
Influenza B by PCR: NEGATIVE
SARS Coronavirus 2 by RT PCR: NEGATIVE

## 2019-12-27 LAB — POC SARS CORONAVIRUS 2 AG -  ED: SARS Coronavirus 2 Ag: NEGATIVE

## 2019-12-27 LAB — CBG MONITORING, ED
Glucose-Capillary: 489 mg/dL — ABNORMAL HIGH (ref 70–99)
Glucose-Capillary: 367 mg/dL — ABNORMAL HIGH (ref 70–99)

## 2019-12-27 LAB — BRAIN NATRIURETIC PEPTIDE: B Natriuretic Peptide: 423 pg/mL — ABNORMAL HIGH (ref 0.0–100.0)

## 2019-12-27 MED ORDER — ALBUTEROL SULFATE (2.5 MG/3ML) 0.083% IN NEBU
2.5000 mg | INHALATION_SOLUTION | Freq: Once | RESPIRATORY_TRACT | Status: AC
  Administered 2019-12-27: 2.5 mg via RESPIRATORY_TRACT
  Filled 2019-12-27: qty 3

## 2019-12-27 MED ORDER — INSULIN ASPART 100 UNIT/ML ~~LOC~~ SOLN
8.0000 [IU] | Freq: Once | SUBCUTANEOUS | Status: AC
  Administered 2019-12-27: 8 [IU] via SUBCUTANEOUS
  Filled 2019-12-27: qty 1

## 2019-12-27 MED ORDER — FUROSEMIDE 10 MG/ML IJ SOLN
40.0000 mg | Freq: Once | INTRAMUSCULAR | Status: AC
  Administered 2019-12-27: 40 mg via INTRAVENOUS
  Filled 2019-12-27: qty 4

## 2019-12-27 MED ORDER — IPRATROPIUM-ALBUTEROL 0.5-2.5 (3) MG/3ML IN SOLN
3.0000 mL | Freq: Once | RESPIRATORY_TRACT | Status: AC
  Administered 2019-12-27: 3 mL via RESPIRATORY_TRACT
  Filled 2019-12-27: qty 3

## 2019-12-27 MED ORDER — INSULIN GLARGINE 100 UNIT/ML ~~LOC~~ SOLN
60.0000 [IU] | Freq: Once | SUBCUTANEOUS | Status: AC
  Administered 2019-12-27: 60 [IU] via SUBCUTANEOUS
  Filled 2019-12-27: qty 1
  Filled 2019-12-27: qty 0.6

## 2019-12-27 MED ORDER — ALBUTEROL SULFATE HFA 108 (90 BASE) MCG/ACT IN AERS
4.0000 | INHALATION_SPRAY | Freq: Once | RESPIRATORY_TRACT | Status: AC
  Administered 2019-12-27: 4 via RESPIRATORY_TRACT
  Filled 2019-12-27: qty 6.7

## 2019-12-27 MED ORDER — SODIUM CHLORIDE 0.9 % IV BOLUS
1000.0000 mL | Freq: Once | INTRAVENOUS | Status: AC
  Administered 2019-12-27: 1000 mL via INTRAVENOUS

## 2019-12-27 NOTE — Discharge Instructions (Addendum)
Continue using your home nebulizer treatments as needed if you have a return of shortness of breath.  Keep a close watch on your blood glucose levels as you are glucose level was very elevated here.  You have received your evening dose of your Lantus.  Make sure you are not missing your doses of your insulins to ensure your glucose level returns to levels.  Get rechecked for any worsening symptoms.  You may want to avoid excessive exposure to the wood smoke in your home until your symptoms are under better control.

## 2019-12-27 NOTE — ED Provider Notes (Signed)
[facility] EMERGENCY DEPARTMENT Provider Note   CSN: [id number] Arrival date & time: 12/27/19  1540     History Chief Complaint  Patient presents with  . Shortness of Breath    Dustin Cruz is a 54 y.o. male with a history of COPD, diabetes, CAD, CHF GERD and chronic kidney disease presenting with a several day history of generalized fatigue and has developed shortness of breath with nonproductive cough and wheezing x2 days.  He denies fevers or chills, also denies peripheral edema, orthopnea.  He has no chest pain.  He has used his home albuterol without significant improvement in his breathing.  He does state he has been using wood heat this week and suspects this may have exacerbated his COPD.  He endorses missing all of his home medications today including his insulins as he just did not feel well enough to take his medications.  He denies nausea or vomiting, no abdominal pain, no dysuria or increased urinary frequency.  He has had increased thirst.  He has not checked his CBG today.    The history is provided by the patient.       Past Medical History:  Diagnosis Date  . Anxiety   . Asthma   . CKD stage 3 due to type 2 diabetes mellitus ([place])   . COPD (chronic obstructive pulmonary disease) ([place])   . Coronary artery disease    s/p BMS to [place] 10/2010;  Cath 01/22/12 prox 30-40% LAD, LCx ramus w/ hazy 70-80% in-stent restenosis, EF 60% treated medically  . Coronary artery disease    s/p BMS to [place] 10/2010;  Cath 01/22/12 prox 30-40% LAD, LCx ramus w/ hazy 70-80% in-stent restenosis, EF 60% treated medically   . Depression   . Diabetes mellitus    Type 2  . Gastroparesis 05/2013  . GERD (gastroesophageal reflux disease)   . HTN (hypertension)   . Hyperlipidemia   . Hyperlipidemia   . Major depression, chronic 08/13/2012  . Melanoma ([place]) 2007   surgery at [facility], Followed by [place]  . Myocardial infarction ([place]) 2012  . Obesity   . Tobacco abuse   . Tubular adenoma    . Urinary retention     Patient Active Problem List   Diagnosis Date Noted  . Pre-ulcerative calluses 10/29/2019  . Musculoskeletal pain 10/13/2019  . Housing problems 05/23/2019  . Essential hypertension, benign 03/20/2019  . 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 Near syncope 03/17/2019  . AKI (acute kidney injury) ([place]) 03/17/2019  . 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 CKD stage 3 due to type 2 diabetes mellitus ([place]) 11/28/2018  . Microalbuminuria due to type 2 diabetes mellitus ([place]) 05/22/2018  . Hyperglycemia 04/24/2018  . Abdominal pain, epigastric 11/29/2017  . Diarrhea 11/29/2017  . History of colonic polyps 11/29/2017  . Diffuse abdominal pain 11/29/2017  . Diabetic retinopathy ([place]) 09/21/2015  . Carotid bruit 09/21/2015  . Viral gastroenteritis 06/03/2015  . Hypertriglyceridemia 05/18/2015  . Vitamin D deficiency 12/29/2013  . Nausea alone 04/24/2013  . Frequency of urination 04/14/2013  . Fever, unspecified 04/14/2013  . Hepatic steatosis 12/28/2012  . Peripheral neuropathy 11/25/2012  . Melanoma of skin, site unspecified 11/25/2012  . Mixed hyperlipidemia   . Depression   . Hypertension associated with diabetes ([place])   . GERD (gastroesophageal reflux disease) 04/03/2012  . Current smoker 03/14/2012  . Type 2 diabetes mellitus with stage 3a chronic kidney disease, with long-term current use of insulin ([place])   . Arteriosclerotic cardiovascular disease (ASCVD)  01/06/2011    Past Surgical History:  Procedure Laterality Date  . BIOPSY  01/07/2018   Procedure: BIOPSY;  Surgeon: Daneil Dolin, MD;  Location: [facility] ENDO SUITE;  Service: Endoscopy;;  duodenal and gastric biopsy  . BIOPSY  04/25/2018   Procedure: BIOPSY;  Surgeon: Daneil Dolin, MD;  Location: [facility] ENDO SUITE;  Service: Endoscopy;;  ascending and descending and sigmoid biopsies  . CARDIAC CATHETERIZATION     with stent  . COLONOSCOPY N/A 01/27/2013   BPZ:WCHENI and colonic polyps. Tubular adenomas, poor bowel  prep, one-year follow-up surveillance colonoscopy recommended  . COLONOSCOPY WITH PROPOFOL N/A 04/25/2018   Procedure: COLONOSCOPY WITH PROPOFOL;  Surgeon: Daneil Dolin, MD;  Location: [facility] ENDO SUITE;  Service: Endoscopy;  Laterality: N/A;  11:00am - pt to be prepped INPT and labs to be done as well  . CORONARY ANGIOPLASTY WITH STENT PLACEMENT    . ESOPHAGOGASTRODUODENOSCOPY  04/24/2012   [place]-mild erosive reflux esophagitis,dilated w/4F Venia Minks, small HH, minimal chronic gastric/bulbar erosions(No H pylori)  . ESOPHAGOGASTRODUODENOSCOPY (EGD) WITH PROPOFOL N/A 01/07/2018   Dr. Gala Romney: Erythematous mucosa in the stomach, retained gastric contents, incomplete exam.  Biopsied showed mild chronic gastritis but no H. pylori.  Duodenal biopsy was negative for celiac disease.  [name] FLEXIBLE SIGMOIDOSCOPY N/A 01/07/2018   Dr. Gala Romney: Incomplete colonoscopy due to inadequate bowel prep  . LEFT HEART CATHETERIZATION WITH CORONARY ANGIOGRAM N/A 01/22/2012   Procedure: LEFT HEART CATHETERIZATION WITH CORONARY ANGIOGRAM;  Surgeon: Jolaine Artist, MD;  Location: [facility] CATH LAB;  Service: Cardiovascular;  Laterality: N/A;  . melanoma surgery  2007   [place], removed lymph nodes under arm as well,Left abd       Family History  Problem Relation Age of Onset  . Stroke Mother   . Alcohol abuse Father   . Heart disease Other   . Other Other        not real familiar with family history  . Lung cancer Other   . Colon cancer Neg Hx   . Colon polyps Neg Hx     Social History   Tobacco Use  . Smoking status: Current Every Day Smoker    Packs/day: 1.00    Years: 31.00    [place] years: 31.00    Types: Cigarettes    Start date: 10/09/1981  . Smokeless tobacco: Never Used  . Tobacco comment: 1/2 [place] daily 11/01/17  Substance Use Topics  . 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  . Drug  use: No    Home Medications Prior to Admission medications   Medication Sig Start Date End Date Taking? Authorizing Provider  acetaminophen (TYLENOL) 325 MG tablet Take 2 tablets (650 mg total) by mouth every 6 (six) hours as needed for mild pain, moderate pain or fever (or Fever >/= 101). 03/21/19  Yes Emokpae, Courage, MD  albuterol (PROVENTIL) (2.5 MG/3ML) 0.083% nebulizer solution Take 3 mLs (2.5 mg total) by nebulization every 6 (six) hours as needed for wheezing or shortness of breath. Dx---44.1 06/06/19  Yes Gottschalk, Ashly M, DO  albuterol (VENTOLIN HFA) 108 (90 Base) MCG/ACT inhaler 2 PUFFS INTO THE LUNGS EVERY 6 HOURS AS NEEDED FOR WHEEZING. Patient taking differently: Inhale 2 puffs into the lungs every 6 (six) hours as needed for wheezing.  12/10/19  Yes Gottschalk, Leatrice Jewels M, DO  apixaban (ELIQUIS) 5 MG TABS  tablet Take 1 tablet (5 mg total) by mouth 2 (two) times daily. 07/16/19  Yes Strader, Fransisco Hertz, PA-C  aspirin EC 81 MG tablet Take 1 tablet (81 mg total) by mouth daily with breakfast. 03/21/19  Yes Emokpae, Courage, MD  Blood Glucose Monitoring Suppl (ACCU-CHEK GUIDE) w/Device KIT 1 Piece by Does not apply route as directed. 11/12/19  Yes Nida, Marella Chimes, MD  busPIRone (BUSPAR) 7.5 MG tablet TAKE 1 TABLET BY MOUTH TWICE DAILY 10/07/19  Yes Ronnie Doss M, DO  famotidine (PEPCID) 20 MG tablet Take 20 mg by mouth 2 (two) times daily. 12/22/19  Yes [provider]  gabapentin (NEURONTIN) 600 MG tablet TAKE 1 TABLET(600 MG) BY MOUTH THREE TIMES DAILY Patient taking differently: Take 600 mg by mouth 3 (three) times daily.  12/08/19  Yes Ronnie Doss M, DO  hydrOXYzine (VISTARIL) 25 MG capsule Take 1 capsule (25 mg total) by mouth 3 (three) times daily as needed. 12/18/19  Yes Rakes, Connye Burkitt, FNP  insulin aspart (NOVOLOG FLEXPEN) 100 UNIT/ML FlexPen Inject 10 Units into the skin 3 (three) times daily before meals. 11/12/19  Yes Cassandria Anger, MD  Insulin Glargine  (LANTUS SOLOSTAR) 100 UNIT/ML Solostar Pen Inject 60 Units into the skin every evening. 06/06/19  Yes Gottschalk, Ashly M, DO  lisinopril (ZESTRIL) 5 MG tablet TAKE 1 TABLET AT BEDTIME 12/08/19  Yes Gottschalk, Ashly M, DO  metFORMIN (GLUCOPHAGE) 500 MG tablet Take 1 tablet (500 mg total) by mouth 2 (two) times daily with a meal. 11/12/19  Yes Nida, Marella Chimes, MD  nitroGLYCERIN (NITROSTAT) 0.4 MG SL tablet Place 1 tablet (0.4 mg total) under the tongue every 5 (five) minutes as needed. For chest pain. 08/13/12  Yes Reece Packer, NP  tiZANidine (ZANAFLEX) 4 MG tablet TAKE 1/2 TO 1 TABLET BY MOUTH EVERY 8 HOURS AS NEEDED FOR MUSCLE SPASMS. 12/10/19  Yes Gottschalk, Ashly M, DO  traZODone (DESYREL) 150 MG tablet TAKE 1 TABLET BY MOUTH AT BEDTIME 10/07/19  Yes Gottschalk, Ashly M, DO  triamcinolone cream (KENALOG) 0.1 % Apply 1 application topically 2 (two) times daily. 12/18/19  Yes Rakes, Connye Burkitt, FNP  glucose blood (ACCU-CHEK GUIDE) test strip Use as instructed 11/12/19   Cassandria Anger, MD  metoprolol tartrate (LOPRESSOR) 25 MG tablet Take 25 mg by mouth 2 (two) times daily.    12/27/11  [provider]    Allergies    Patient has no known allergies.  Review of Systems   Review of Systems  Constitutional: Positive for fatigue. Negative for chills and fever.  HENT: Negative for congestion.   Eyes: Negative.   Respiratory: Positive for cough, chest tightness, shortness of breath and wheezing.   Cardiovascular: Negative for chest pain, palpitations and leg swelling.  Gastrointestinal: Negative for abdominal pain, nausea and vomiting.  Endocrine: Positive for polydipsia. Negative for polyuria.  Genitourinary: Negative.   Musculoskeletal: Negative for arthralgias, joint swelling and neck pain.  Skin: Negative.  Negative for rash and wound.  Neurological: Positive for weakness. Negative for dizziness, light-headedness, numbness and headaches.  Psychiatric/Behavioral: Negative.      Physical Exam Updated Vital Signs BP 122/68   Pulse 87   Temp 99.6 F (37.6 C) (Oral)   Resp 16   Ht '6\' 2"'  (1.88 m)   Wt 81.5 kg   SpO2 98%   BMI 23.07 kg/m   Physical Exam Vitals and nursing note reviewed.  Constitutional:      Appearance: He is well-developed.  HENT:  Head: Normocephalic and atraumatic.  Eyes:     Conjunctiva/sclera: Conjunctivae normal.  Cardiovascular:     Rate and Rhythm: Normal rate and regular rhythm.     Heart sounds: Normal heart sounds.  Pulmonary:     Effort: Pulmonary effort is normal.     Breath sounds: Decreased breath sounds present. No wheezing, rhonchi or rales.     Comments: Decreased breath sounds throughout all lung fields.  Prolonged expirations.  No wheezing on initial exam. Abdominal:     General: Bowel sounds are normal.     Palpations: Abdomen is soft.     Tenderness: There is no abdominal tenderness.  Musculoskeletal:        General: Normal range of motion.     Cervical back: Normal range of motion.  Skin:    General: Skin is warm and dry.  Neurological:     General: No focal deficit present.     Mental Status: He is alert.     ED Results / Procedures / Treatments   Labs (all labs ordered are listed, but only abnormal results are displayed) Labs Reviewed  CBC WITH DIFFERENTIAL/PLATELET - Abnormal; Notable for the following components:      Result Value   Hemoglobin 17.4 (*)    All other components within normal limits  BASIC METABOLIC PANEL - Abnormal; Notable for the following components:   Sodium 129 (*)    Chloride 94 (*)    Glucose, Bld 484 (*)    Calcium 8.7 (*)    All other components within normal limits  BRAIN NATRIURETIC PEPTIDE - Abnormal; Notable for the following components:   B Natriuretic Peptide 423.0 (*)    All other components within normal limits  CBG MONITORING, ED - Abnormal; Notable for the following components:   Glucose-Capillary 489 (*)    All other components within normal limits   CBG MONITORING, ED - Abnormal; Notable for the following components:   Glucose-Capillary 367 (*)    All other components within normal limits  RESPIRATORY PANEL BY RT PCR (FLU A&B, COVID)  POC SARS CORONAVIRUS 2 AG -  ED  TROPONIN I (HIGH SENSITIVITY)    EKG None  Radiology DG Chest Portable 1 View  Result Date: 12/27/2019 CLINICAL DATA:  Shortness of breath, cough EXAM: PORTABLE CHEST 1 VIEW COMPARISON:  Chest radiograph 03/17/2019 FINDINGS: Heart size within normal limits. No evidence of airspace consolidation within the lungs. No evidence of pleural effusion or pneumothorax. No acute bony abnormality. IMPRESSION: No evidence of acute cardiopulmonary abnormality. Electronically Signed   By: Kellie Simmering DO   On: 12/27/2019 16:56    Procedures Procedures (including critical care time)  Medications Ordered in ED Medications  albuterol (VENTOLIN HFA) 108 (90 Base) MCG/ACT inhaler 4 puff (4 puffs Inhalation Given 12/27/19 1700)  sodium chloride 0.9 % bolus 1,000 mL (0 mLs Intravenous Stopped 12/27/19 1849)  insulin aspart (novoLOG) injection 8 Units (8 Units Subcutaneous Given 12/27/19 1700)  furosemide (LASIX) injection 40 mg (40 mg Intravenous Given 12/27/19 1918)  ipratropium-albuterol (DUONEB) 0.5-2.5 (3) MG/3ML nebulizer solution 3 mL (3 mLs Nebulization Given 12/27/19 2026)  albuterol (PROVENTIL) (2.5 MG/3ML) 0.083% nebulizer solution 2.5 mg (2.5 mg Nebulization Given 12/27/19 2025)  insulin glargine (LANTUS) injection 60 Units (60 Units Subcutaneous Given 12/27/19 2255)    ED Course  I have reviewed the triage vital signs and the nursing notes.  Pertinent labs & imaging results that were available during my care of the patient were reviewed by me  and considered in my medical decision making (see chart for details).    MDM Rules/Calculators/A&P                      Pt with hyperglycemia with sob and wheezing, pt denies peripheral edema, orthopnea but has had wheezing.  He was  given IV fluids and insulin to tx  His hyperglycemia, given albuterol mdi while awaiting covid POC which was negative.  No improvement in breathing, although he remains without WOB, no accessory muscle use and O2 sat over 95% on room air.    2:18 AM Lab review suggesting CHF with elevation in bnp, cxr without pulmonary edema.  Re- exam, lungs more rhonchorous.  Had received 500 cc NS which was stopped.  Given Lasix 40 IV along with full neb tx now that Covid is negative. Plan to reassess after this tx to determine dispo status.   Full lab unable to be given as respiratory therapist insisted the 2-hour Covid test must be completed first.  This was completed and is negative.  In the interim he had been given 4 more puffs of his albuterol MDI after which he did have some expiratory wheezing but he was moving air throughout all lung fields much more freely than on first exam.  After the confirmatory Covid test was resulted and negative he was given a nebulizer treatment including Atrovent and albuterol.  After this treatment his wheezing was completely resolved, he had no rhonchi.  He was ambulated in the department and his pulse ox remained at 98% and he felt like he was at his baseline with his breathing.  He was discharged home.  We discussed his elevated blood glucose levels.  He had not taken any of his medications today.  He was given his evening dose of his Lantus prior to discharge home and advised that he must closely watch his CBGs over the next several days and not miss any of his doses of his insulin.  He does not have DKA, no anion gap.  His CBG had improved to 367 by time of discharge.  He was felt stable for discharge home at this time.  Return precautions were outlined. Final Clinical Impression(s) / ED Diagnoses Final diagnoses:  COPD exacerbation ([place])  Hyperglycemia    Rx / DC Orders ED Discharge Orders    None       Landis Martins 12/28/19 0211    Milton Ferguson,  MD 12/30/19 1029

## 2019-12-27 NOTE — ED Notes (Signed)
ED Provider at bedside. 

## 2019-12-27 NOTE — ED Triage Notes (Signed)
Patient complains of shortness of breath x 2 days with cough. States history of heart failure and COPD. Patient complains of cough.

## 2019-12-27 NOTE — ED Notes (Signed)
Pt ambulated on Pulse Ox. Pt maintained 98% O2 while ambulating.

## 2019-12-30 ENCOUNTER — Ambulatory Visit: Payer: Medicare Other | Admitting: Nutrition

## 2019-12-30 ENCOUNTER — Ambulatory Visit (INDEPENDENT_AMBULATORY_CARE_PROVIDER_SITE_OTHER): Payer: Medicare Other | Admitting: Licensed Clinical Social Worker

## 2019-12-30 DIAGNOSIS — N1831 Chronic kidney disease, stage 3a: Secondary | ICD-10-CM | POA: Diagnosis not present

## 2019-12-30 DIAGNOSIS — I10 Essential (primary) hypertension: Secondary | ICD-10-CM | POA: Diagnosis not present

## 2019-12-30 DIAGNOSIS — E1121 Type 2 diabetes mellitus with diabetic nephropathy: Secondary | ICD-10-CM | POA: Diagnosis not present

## 2019-12-30 DIAGNOSIS — Z794 Long term (current) use of insulin: Secondary | ICD-10-CM

## 2019-12-30 DIAGNOSIS — E785 Hyperlipidemia, unspecified: Secondary | ICD-10-CM

## 2019-12-30 DIAGNOSIS — K219 Gastro-esophageal reflux disease without esophagitis: Secondary | ICD-10-CM

## 2019-12-30 DIAGNOSIS — F329 Major depressive disorder, single episode, unspecified: Secondary | ICD-10-CM

## 2019-12-30 DIAGNOSIS — F32A Depression, unspecified: Secondary | ICD-10-CM

## 2019-12-30 NOTE — Chronic Care Management (AMB) (Signed)
  Care Management Note   Dustin Cruz is a 54 y.o. year old male who is a primary care patient of Dustin Norlander, DO. The CM team was consulted for assistance with chronic disease management and care coordination.   I reached out to Dustin Cruz via face to face appointment today at [facility].   Review of patient status, including review of consultants reports, relevant laboratory and other test results, and collaboration with appropriate care team members and the patient's provider was performed as part of comprehensive patient evaluation and provision of chronic care management services.   Social determinants of health:risk of tobacco use; risk of depression; risk of housing needs    Office Visit from 10/29/2019 in [place]  PHQ-9 Total Score  0     GAD 7 : Generalized Anxiety Score 11/22/2018  Nervous, Anxious, on Edge 0  Control/stop worrying 0  Worry too much - different things 0  Trouble relaxing 0  Restless 0  Easily annoyed or irritable 0  Afraid - awful might happen 0  Total GAD 7 Score 0  Anxiety Difficulty Not difficult at all   Medications   acetaminophen (TYLENOL) 325 MG tablet albuterol (PROVENTIL) (2.5 MG/3ML) 0.083% nebulizer solution albuterol (VENTOLIN HFA) 108 (90 Base) MCG/ACT inhaler apixaban (ELIQUIS) 5 MG TABS tablet aspirin EC 81 MG tablet Blood Glucose Monitoring Suppl (ACCU-CHEK GUIDE) w/Device KIT busPIRone (BUSPAR) 7.5 MG tablet famotidine (PEPCID) 20 MG tablet gabapentin (NEURONTIN) 600 MG tablet glucose blood (ACCU-CHEK GUIDE) test strip hydrOXYzine (VISTARIL) 25 MG capsule insulin aspart (NOVOLOG FLEXPEN) 100 UNIT/ML FlexPen Insulin Glargine (LANTUS SOLOSTAR) 100 UNIT/ML Solostar Pen lisinopril (ZESTRIL) 5 MG tablet metFORMIN (GLUCOPHAGE) 500 MG tablet nitroGLYCERIN (NITROSTAT) 0.4 MG SL tablet tiZANidine (ZANAFLEX) 4 MG tablet traZODone (DESYREL) 150 MG tablet triamcinolone cream (KENALOG) 0.1 %  Goals    .  Client said he had dental care issues and needs to see dentist for care (pt-stated)     Barriers:  Dental pain issues in patient with Chronic Diagnoses of HTN, Depression, GERD, DM Type 2, and HLD  Financial Challenges  Clinical Social Work Goal: Over next 30 days , client  will work with LCSW to address concerns related to dental care needs of client  Interventions  Talked with client about housing needs of client Talked with client about transport needs of client LCSW assisted client in completing Housing Application to [facility] in [place], [place]  Patient Self Care Activities: Self administers medications as prescribed Attends all scheduled provider appointments Performs ADL's independently  Plan: Client to call LCSW as needed to discuss psychosocial needs of client LCSW to call client in next 4 weeks to discuss current client needs including dental needs of client Client to communicate with RN CM to discuss nursing needs of client.      Follow Up Plan: LCSW to call client in next 4 weeks to discuss current client needs including the dental needs of client  Dustin Cruz.Dustin Cruz MSW, LCSW Licensed Clinical Social Worker [facility] Care Management [phone]

## 2019-12-30 NOTE — Patient Instructions (Addendum)
Licensed Clinical Social Worker Visit Information  Goals we discussed today:   Goals    . Client said he had dental care issues and needs to see dentist for care (pt-stated)     Barriers:  Dental pain issues in patient with Chronic Diagnoses of HTN, Depression, GERD, DM Type 2, and HLD  Financial Challenges  Clinical Social Work Goal: Over next 30 days , client  will work with LCSW to address concerns related to dental care needs of client  Interventions:  Talked with client about housing needs of client Talked with client about transport needs of client LCSW assisted client in completing Housing Application to [facility] in [place], [place]  Patient Self Care Activities: Self administers medications as prescribed Attends all scheduled provider appointments Performs ADL's independently  Plan: Client to call LCSW as needed to discuss psychosocial needs of client LCSW to call client in next 4 weeks to discuss current client needs including dental needs of client Client to communicate with RN CM to discuss nursing needs of client.         Materials Provided: No  Follow Up Plan: LCSW to call client in next 4 weeks to discuss current client needs including dental needs of client  The patient verbalized understanding of instructions provided today and declined a print copy of patient instruction materials.   Norva Riffle.Tabari Volkert MSW, LCSW Licensed Clinical Social Worker [facility] [phone]

## 2020-01-05 ENCOUNTER — Other Ambulatory Visit: Payer: Self-pay | Admitting: Family Medicine

## 2020-01-08 ENCOUNTER — Other Ambulatory Visit: Payer: Self-pay | Admitting: Family Medicine

## 2020-01-08 DIAGNOSIS — R059 Cough, unspecified: Secondary | ICD-10-CM

## 2020-01-08 DIAGNOSIS — R05 Cough: Secondary | ICD-10-CM

## 2020-01-22 ENCOUNTER — Other Ambulatory Visit: Payer: Self-pay | Admitting: Family Medicine

## 2020-01-22 DIAGNOSIS — R059 Cough, unspecified: Secondary | ICD-10-CM

## 2020-01-22 DIAGNOSIS — R05 Cough: Secondary | ICD-10-CM

## 2020-02-03 ENCOUNTER — Other Ambulatory Visit: Payer: Self-pay | Admitting: Family Medicine

## 2020-02-03 MED ORDER — TRAZODONE HCL 150 MG PO TABS: 150.0000 mg | ORAL_TABLET | Freq: Every day | ORAL | 1 refills | Status: DC

## 2020-02-03 NOTE — Addendum Note (Signed)
Addended by: Antonietta Barcelona D on: 02/03/2020 03:03 PM   Modules accepted: Orders

## 2020-02-14 ENCOUNTER — Other Ambulatory Visit: Payer: Self-pay | Admitting: Family Medicine

## 2020-02-14 DIAGNOSIS — R059 Cough, unspecified: Secondary | ICD-10-CM

## 2020-02-14 DIAGNOSIS — R05 Cough: Secondary | ICD-10-CM

## 2020-02-16 MED ORDER — BUSPIRONE HCL 7.5 MG PO TABS: ORAL_TABLET | ORAL | 0 refills | Status: DC

## 2020-02-16 MED ORDER — ALBUTEROL SULFATE (2.5 MG/3ML) 0.083% IN NEBU: 2.5000 mg | INHALATION_SOLUTION | Freq: Four times a day (QID) | RESPIRATORY_TRACT | 0 refills | Status: AC | PRN

## 2020-02-16 NOTE — Telephone Encounter (Signed)
Gottschalk. NTBS 30 days given 01/08/20

## 2020-02-16 NOTE — Addendum Note (Signed)
Addended by: Christia Reading on: 02/16/2020 03:34 PM   Modules accepted: Orders

## 2020-02-16 NOTE — Telephone Encounter (Signed)
One month refill given Appointment scheduled

## 2020-02-17 ENCOUNTER — Other Ambulatory Visit: Payer: Self-pay | Admitting: Family Medicine

## 2020-02-17 DIAGNOSIS — R05 Cough: Secondary | ICD-10-CM

## 2020-02-17 DIAGNOSIS — R059 Cough, unspecified: Secondary | ICD-10-CM

## 2020-02-26 ENCOUNTER — Other Ambulatory Visit: Payer: Self-pay | Admitting: Family Medicine

## 2020-02-26 DIAGNOSIS — R059 Cough, unspecified: Secondary | ICD-10-CM

## 2020-02-26 IMAGING — CT CT NECK WITH CONTRAST
3 of 4 series · 14 of 33 positions shown, 17 images · IV contrast (omnipaque)
Comparison: None.

CLINICAL DATA: Dental caries. Left lower toothache, ear pain, and
jaw swelling.

EXAM:
CT NECK WITH CONTRAST
TECHNIQUE: Multidetector CT imaging of the neck was performed using the
standard protocol following the bolus administration of intravenous
contrast.
CONTRAST:  75mL OMNIPAQUE IOHEXOL 300 MG/ML  SOLN

[Series 4: cor neck · coronal · 0.60mm/px · 3 of 126 slices shown]
[im 44/126  bone]
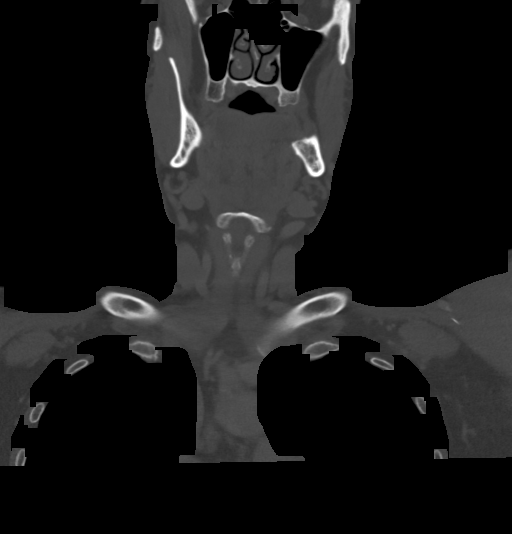
[im 57/126  bone]
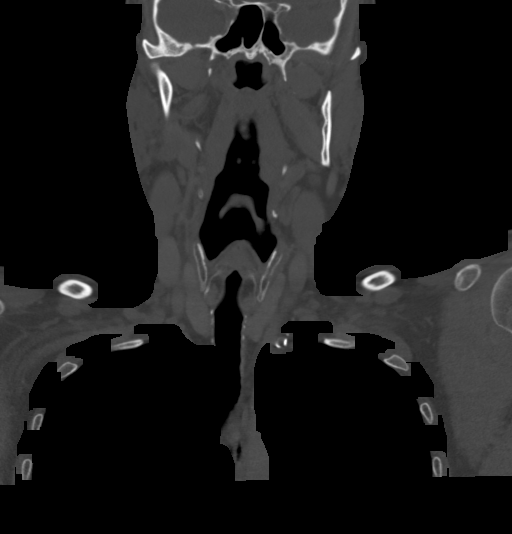
[im 69/126  bone]
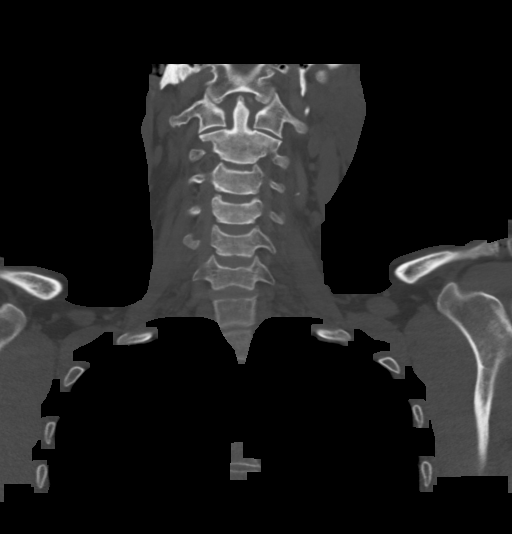

[Series 5: sag neck · sagittal · 0.52mm/px · 5 of 105 slices shown, 6 images]
[im 35/105  bone]
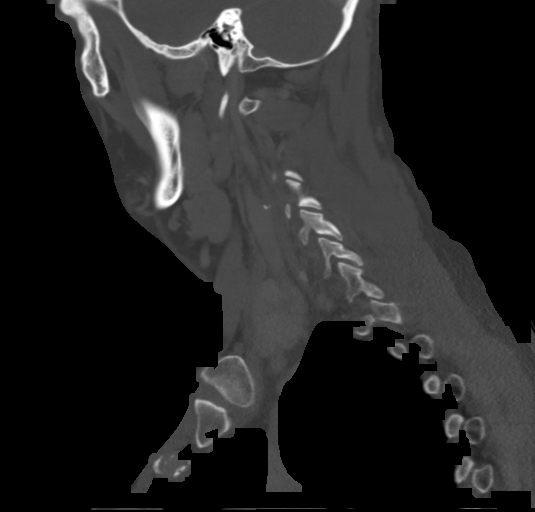
[im 44/105  bone]
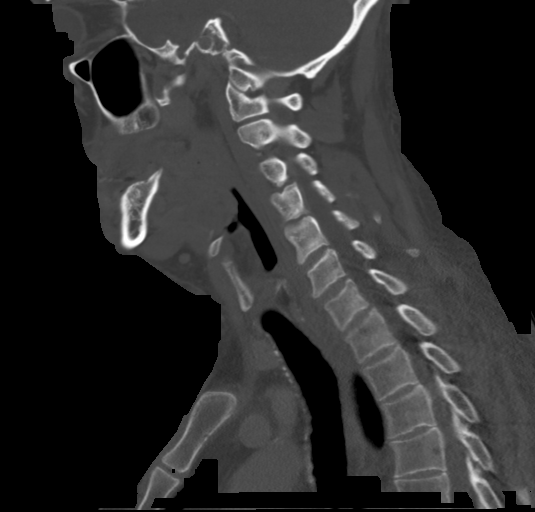
[im 53/105  soft-tissue]
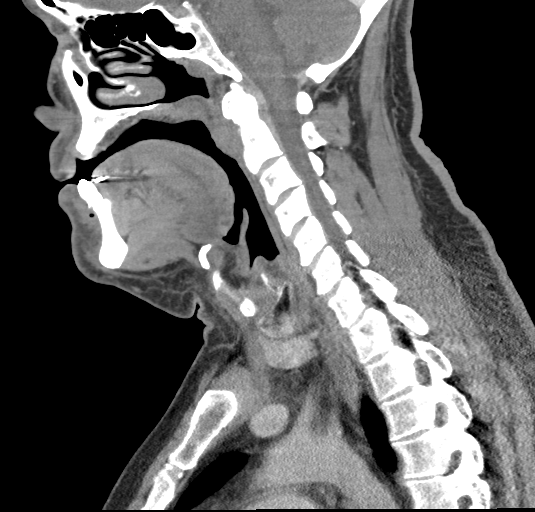
[im 53/105  bone]
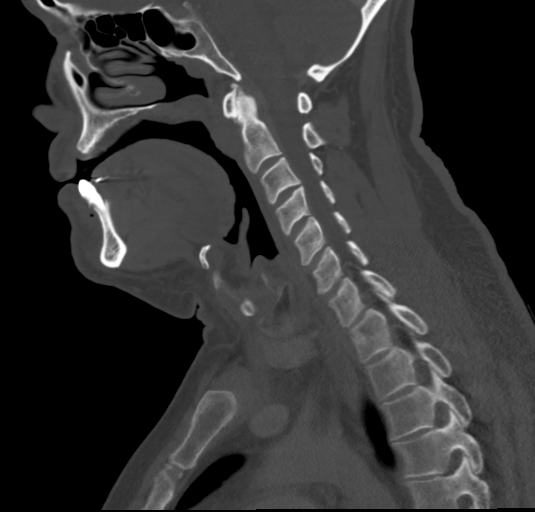
[im 61/105  bone]
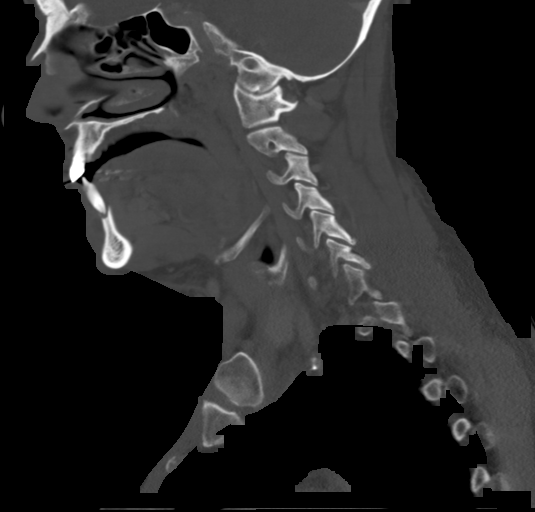
[im 70/105  bone]
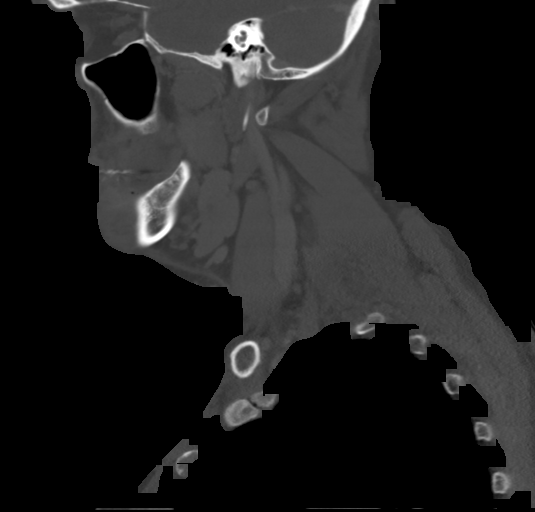

[Series 6: ax oropharynx · axial · 0.50mm/px · z∈[+1154,+1359]mm · 6 of 155 slices shown, 8 images]
[im 23/155  soft-tissue]
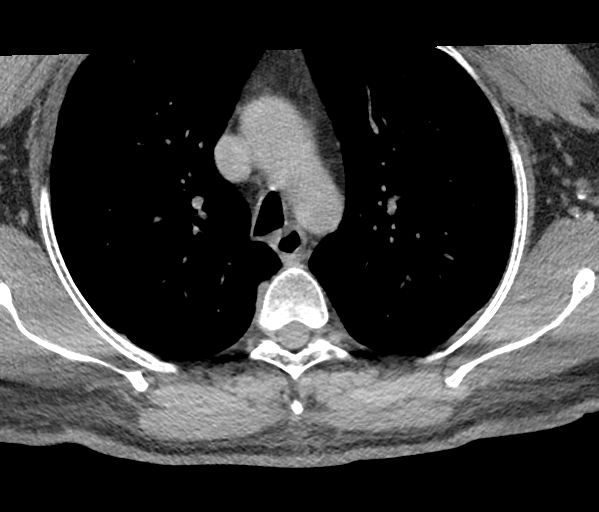
[im 23/155  bone]
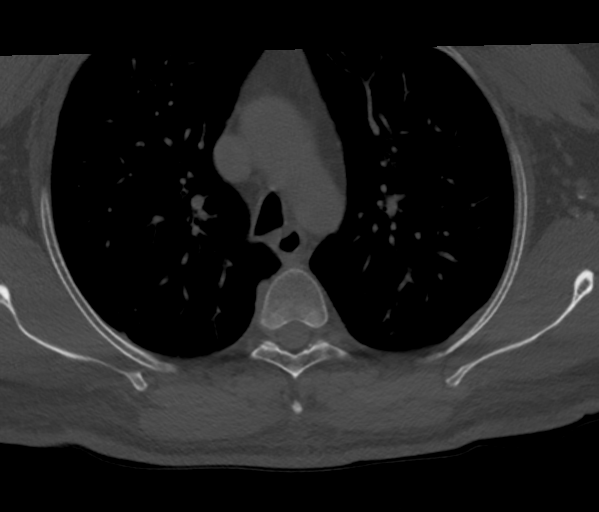
[im 45/155  bone]
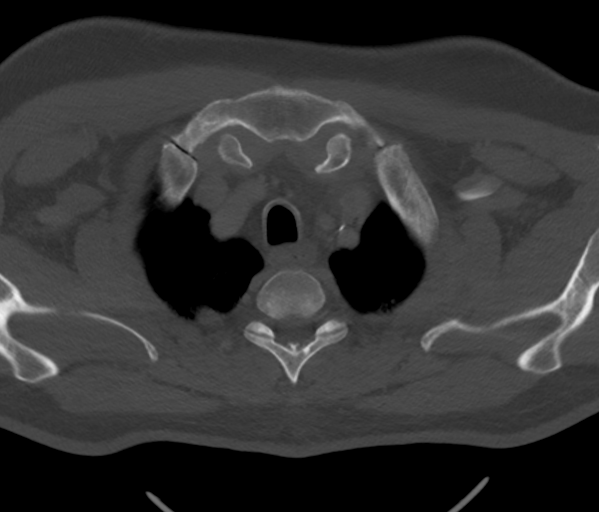
[im 67/155  bone]
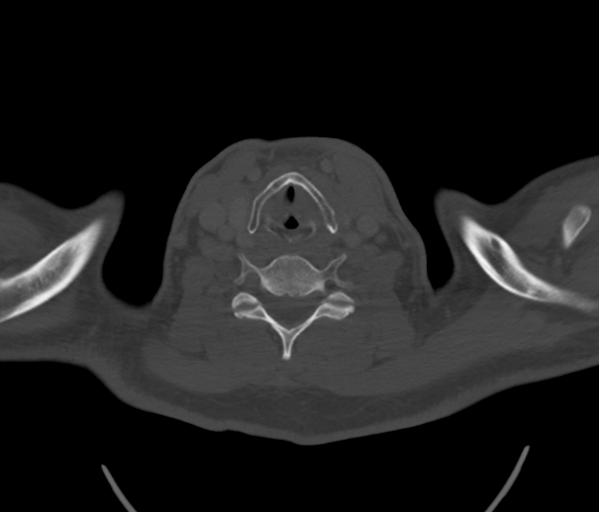
[im 89/155  bone]
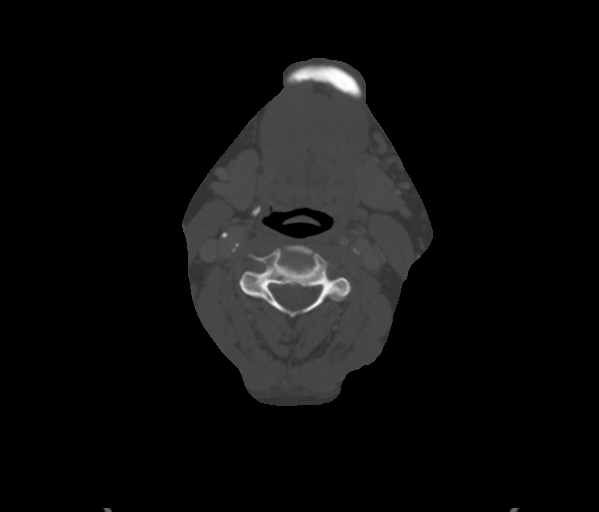
[im 111/155  soft-tissue]
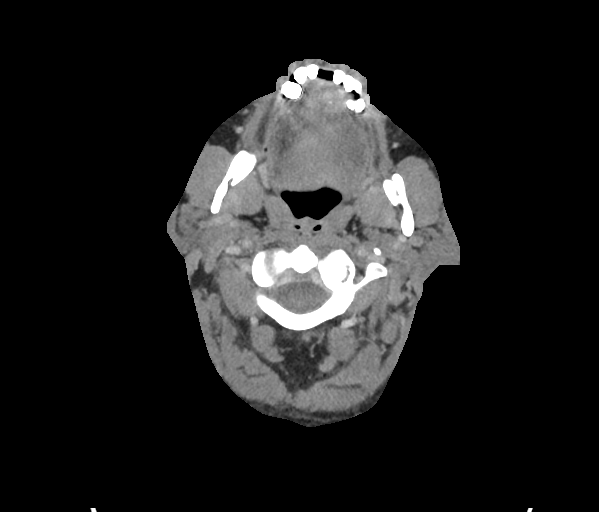
[im 111/155  bone]
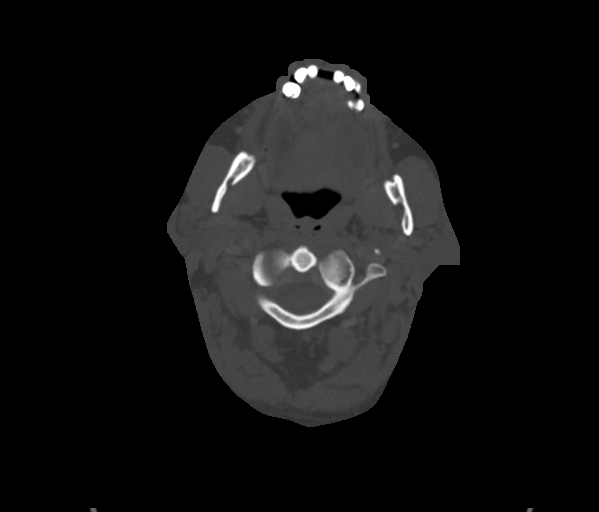
[im 133/155  bone]
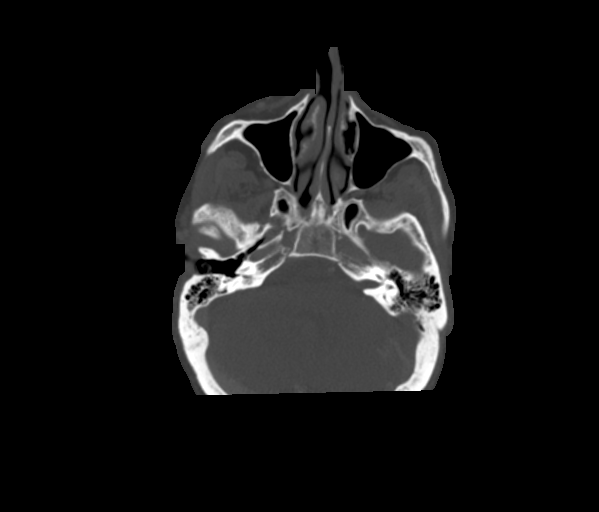

[14 of 33 positions shown; findings below may reference images not displayed]

FINDINGS: Pharynx and larynx: Symmetric pharyngeal soft tissues without
evidence of mass or swelling. Patent airway. No retropharyngeal
fluid. Unremarkable larynx.

Salivary glands: No inflammation, mass, or stone.

Thyroid: Unremarkable.

Lymph nodes: Slight asymmetric prominence of subcentimeter short
axis lymph nodes in the left neck compared to the right, likely
reactive.

Vascular: Mild atherosclerosis at the carotid bifurcations.

Limited intracranial: Unremarkable.

Visualized orbits: Unremarkable.

Mastoids and visualized paranasal sinuses: Clear.

Skeleton: Poor dentition with multiple dental caries and periapical
lucencies. There is periapical lucency associated with the left
lateral maxillary incisor with disruption of the buccal cortex and
an overlying 14 x 6 x 12 mm fluid collection with surrounding soft
tissue thickening/inflammation.

Upper chest: Clear lung apices.

Other: None.
IMPRESSION: Poor dentition. 14 mm subperiosteal abscess associated with the left
lateral maxillary incisor.

## 2020-02-26 NOTE — Telephone Encounter (Signed)
LM 02/26/20 - aware to call for appt

## 2020-02-26 NOTE — Telephone Encounter (Signed)
Gottschalk. NTBS 30 days given 02/03/20 last DM visit Jan 2020

## 2020-02-27 ENCOUNTER — Other Ambulatory Visit: Payer: Self-pay | Admitting: Family Medicine

## 2020-02-27 ENCOUNTER — Telehealth: Payer: Self-pay | Admitting: Family Medicine

## 2020-02-27 DIAGNOSIS — R059 Cough, unspecified: Secondary | ICD-10-CM

## 2020-02-27 MED ORDER — GABAPENTIN 600 MG PO TABS: 600.0000 mg | ORAL_TABLET | Freq: Three times a day (TID) | ORAL | 0 refills | Status: DC

## 2020-02-27 MED ORDER — ALBUTEROL SULFATE HFA 108 (90 BASE) MCG/ACT IN AERS: INHALATION_SPRAY | RESPIRATORY_TRACT | 0 refills | Status: DC

## 2020-02-27 NOTE — Telephone Encounter (Signed)
Patient aware, script is ready. 

## 2020-02-27 NOTE — Telephone Encounter (Signed)
Sent.  Please make sure keeps appt.  We need to talk about controller inhalers given frequency of Albuterol need

## 2020-02-27 NOTE — Telephone Encounter (Signed)
  Prescription Request  02/27/2020  What is the name of the medication or equipment? albuterol (VENTOLIN HFA) 108 (90 Base) MCG/ACT inhaler [Pharmacy Med Name: ALBUTEROL HFA 90 MCG INHALER] gabapentin (NEURONTIN) 600 MG tablet  Have you contacted your pharmacy to request a refill? (if applicable) yes  Which pharmacy would you like this sent to? [facility] pt has appt with Dr. Darnell Level on 03/17/20   Patient notified that their request is being sent to the clinical staff for review and that they should receive a response within 2 business days.

## 2020-02-27 NOTE — Telephone Encounter (Signed)
Has appointment scheduled. Advise on refill request.

## 2020-03-08 ENCOUNTER — Other Ambulatory Visit: Payer: Self-pay | Admitting: Family Medicine

## 2020-03-08 DIAGNOSIS — R059 Cough, unspecified: Secondary | ICD-10-CM

## 2020-03-17 ENCOUNTER — Ambulatory Visit (INDEPENDENT_AMBULATORY_CARE_PROVIDER_SITE_OTHER): Payer: Medicare HMO | Admitting: Family Medicine

## 2020-03-17 ENCOUNTER — Other Ambulatory Visit: Payer: Self-pay

## 2020-03-17 ENCOUNTER — Encounter: Payer: Self-pay | Admitting: Family Medicine

## 2020-03-17 VITALS — BP 127/75 | HR 85 | Temp 97.9°F | Ht 74.0 in | Wt 191.4 lb

## 2020-03-17 DIAGNOSIS — E1121 Type 2 diabetes mellitus with diabetic nephropathy: Secondary | ICD-10-CM

## 2020-03-17 DIAGNOSIS — Z794 Long term (current) use of insulin: Secondary | ICD-10-CM | POA: Diagnosis not present

## 2020-03-17 DIAGNOSIS — N1831 Chronic kidney disease, stage 3a: Secondary | ICD-10-CM | POA: Diagnosis not present

## 2020-03-17 DIAGNOSIS — J410 Simple chronic bronchitis: Secondary | ICD-10-CM

## 2020-03-17 DIAGNOSIS — R600 Localized edema: Secondary | ICD-10-CM | POA: Diagnosis not present

## 2020-03-17 LAB — BAYER DCA HB A1C WAIVED: HB A1C (BAYER DCA - WAIVED): >14 % — ABNORMAL HIGH (ref <7.0)

## 2020-03-17 MED ORDER — BUSPIRONE HCL 7.5 MG PO TABS: ORAL_TABLET | ORAL | 5 refills | Status: DC

## 2020-03-17 MED ORDER — TIZANIDINE HCL 4 MG PO TABS: ORAL_TABLET | ORAL | 3 refills | Status: DC

## 2020-03-17 MED ORDER — NOVOLOG FLEXPEN 100 UNIT/ML ~~LOC~~ SOPN: 10.0000 [IU] | PEN_INJECTOR | Freq: Three times a day (TID) | SUBCUTANEOUS | 2 refills | Status: DC

## 2020-03-17 MED ORDER — GABAPENTIN 600 MG PO TABS: 600.0000 mg | ORAL_TABLET | Freq: Three times a day (TID) | ORAL | 5 refills | Status: DC

## 2020-03-17 MED ORDER — LANTUS SOLOSTAR 100 UNIT/ML ~~LOC~~ SOPN: 60.0000 [IU] | PEN_INJECTOR | Freq: Every evening | SUBCUTANEOUS | 2 refills | Status: DC

## 2020-03-17 MED ORDER — DULERA 100-5 MCG/ACT IN AERO: 2.0000 | INHALATION_SPRAY | Freq: Two times a day (BID) | RESPIRATORY_TRACT | 0 refills | Status: DC

## 2020-03-17 NOTE — Patient Instructions (Signed)
Watch salt intake.  This includes ALL FROZEN FOODS HAVE SALT. You need to cook from scratch   DASH Eating Plan DASH stands for "Dietary Approaches to Stop Hypertension." The DASH eating plan is a healthy eating plan that has been shown to reduce high blood pressure (hypertension). It may also reduce your risk for type 2 diabetes, heart disease, and stroke. The DASH eating plan may also help with weight loss. What are tips for following this plan?  General guidelines  Avoid eating more than 2,300 mg (milligrams) of salt (sodium) a day. If you have hypertension, you may need to reduce your sodium intake to 1,500 mg a day.  Limit alcohol intake to no more than 1 drink a day for nonpregnant women and 2 drinks a day for men. One drink equals 12 oz of beer, 5 oz of wine, or 1 oz of hard liquor.  Work with your health care provider to maintain a healthy body weight or to lose weight. Ask what an ideal weight is for you.  Get at least 30 minutes of exercise that causes your heart to beat faster (aerobic exercise) most days of the week. Activities may include walking, swimming, or biking.  Work with your health care provider or diet and nutrition specialist (dietitian) to adjust your eating plan to your individual calorie needs. Reading food labels   Check food labels for the amount of sodium per serving. Choose foods with less than 5 percent of the Daily Value of sodium. Generally, foods with less than 300 mg of sodium per serving fit into this eating plan.  To find whole grains, look for the word "whole" as the first word in the ingredient list. Shopping  Buy products labeled as "low-sodium" or "no salt added."  Buy fresh foods. Avoid canned foods and premade or frozen meals. Cooking  Avoid adding salt when cooking. Use salt-free seasonings or herbs instead of table salt or sea salt. Check with your health care provider or pharmacist before using salt substitutes.  Do not fry foods. Cook  foods using healthy methods such as baking, boiling, grilling, and broiling instead.  Cook with heart-healthy oils, such as olive, canola, soybean, or sunflower oil. Meal planning  Eat a balanced diet that includes: ? 5 or more servings of fruits and vegetables each day. At each meal, try to fill half of your plate with fruits and vegetables. ? Up to 6-8 servings of whole grains each day. ? Less than 6 oz of lean meat, poultry, or fish each day. A 3-oz serving of meat is about the same size as a deck of cards. One egg equals 1 oz. ? 2 servings of low-fat dairy each day. ? A serving of nuts, seeds, or beans 5 times each week. ? Heart-healthy fats. Healthy fats called Omega-3 fatty acids are found in foods such as flaxseeds and coldwater fish, like sardines, salmon, and mackerel.  Limit how much you eat of the following: ? Canned or prepackaged foods. ? Food that is high in trans fat, such as fried foods. ? Food that is high in saturated fat, such as fatty meat. ? Sweets, desserts, sugary drinks, and other foods with added sugar. ? Full-fat dairy products.  Do not salt foods before eating.  Try to eat at least 2 vegetarian meals each week.  Eat more home-cooked food and less restaurant, buffet, and fast food.  When eating at a restaurant, ask that your food be prepared with less salt or no salt, if  possible. What foods are recommended? The items listed may not be a complete list. Talk with your dietitian about what dietary choices are best for you. Grains Whole-grain or whole-wheat bread. Whole-grain or whole-wheat pasta. Brown rice. [place]. Bulgur. Whole-grain and low-sodium cereals. Pita bread. Low-fat, low-sodium crackers. Whole-wheat flour tortillas. Vegetables Fresh or frozen vegetables (raw, steamed, roasted, or grilled). Low-sodium or reduced-sodium tomato and vegetable juice. Low-sodium or reduced-sodium tomato sauce and tomato paste. Low-sodium or reduced-sodium canned  vegetables. Fruits All fresh, dried, or frozen fruit. Canned fruit in natural juice (without added sugar). Meat and other protein foods Skinless chicken or [place]. Ground chicken or [place]. Pork with fat trimmed off. Fish and seafood. Egg whites. Dried beans, peas, or lentils. Unsalted nuts, nut butters, and seeds. Unsalted canned beans. Lean cuts of beef with fat trimmed off. Low-sodium, lean deli meat. Dairy Low-fat (1%) or fat-free (skim) milk. Fat-free, low-fat, or reduced-fat cheeses. Nonfat, low-sodium ricotta or cottage cheese. Low-fat or nonfat yogurt. Low-fat, low-sodium cheese. Fats and oils Soft margarine without trans fats. Vegetable oil. Low-fat, reduced-fat, or light mayonnaise and salad dressings (reduced-sodium). Canola, safflower, olive, soybean, and sunflower oils. Avocado. Seasoning and other foods Herbs. Spices. Seasoning mixes without salt. Unsalted popcorn and pretzels. Fat-free sweets. What foods are not recommended? The items listed may not be a complete list. Talk with your dietitian about what dietary choices are best for you. Grains Baked goods made with fat, such as croissants, muffins, or some breads. Dry pasta or rice meal packs. Vegetables Creamed or fried vegetables. Vegetables in a cheese sauce. Regular canned vegetables (not low-sodium or reduced-sodium). Regular canned tomato sauce and paste (not low-sodium or reduced-sodium). Regular tomato and vegetable juice (not low-sodium or reduced-sodium). Angie Fava. Olives. Fruits Canned fruit in a light or heavy syrup. Fried fruit. Fruit in cream or butter sauce. Meat and other protein foods Fatty cuts of meat. Ribs. Fried meat. Berniece Salines. Sausage. Bologna and other processed lunch meats. Salami. Fatback. Hotdogs. Bratwurst. Salted nuts and seeds. Canned beans with added salt. Canned or smoked fish. Whole eggs or egg yolks. Chicken or [place] with skin. Dairy Whole or 2% milk, cream, and half-and-half. Whole or full-fat  cream cheese. Whole-fat or sweetened yogurt. Full-fat cheese. Nondairy creamers. Whipped toppings. Processed cheese and cheese spreads. Fats and oils Butter. Stick margarine. Lard. Shortening. Ghee. Bacon fat. Tropical oils, such as coconut, palm kernel, or palm oil. Seasoning and other foods Salted popcorn and pretzels. Onion salt, garlic salt, seasoned salt, table salt, and sea salt. Worcestershire sauce. Tartar sauce. Barbecue sauce. Teriyaki sauce. Soy sauce, including reduced-sodium. Steak sauce. Canned and packaged gravies. Fish sauce. Oyster sauce. Cocktail sauce. Horseradish that you find on the shelf. Ketchup. Mustard. Meat flavorings and tenderizers. Bouillon cubes. Hot sauce and Tabasco sauce. Premade or packaged marinades. Premade or packaged taco seasonings. Relishes. Regular salad dressings. Where to find more information:  National Heart, Lung, and [place]: https://wilson-eaton.com/  American Heart Association: www.heart.org Summary  The DASH eating plan is a healthy eating plan that has been shown to reduce high blood pressure (hypertension). It may also reduce your risk for type 2 diabetes, heart disease, and stroke.  With the DASH eating plan, you should limit salt (sodium) intake to 2,300 mg a day. If you have hypertension, you may need to reduce your sodium intake to 1,500 mg a day.  When on the DASH eating plan, aim to eat more fresh fruits and vegetables, whole grains, lean proteins, low-fat dairy, and heart-healthy fats.  Work with your health care provider or diet and nutrition specialist (dietitian) to adjust your eating plan to your individual calorie needs. This information is not intended to replace advice given to you by your health care provider. Make sure you discuss any questions you have with your health care provider. Document Revised: 09/07/2017 Document Reviewed: 09/18/2016 Elsevier Patient Education  2020 [employer].

## 2020-03-17 NOTE — Progress Notes (Signed)
Subjective: CC: f/u DM2, COPD PCP: [name], DO CXK:GYJEH D [facility] is a 54 y.o. male presenting to clinic today for:  1.  Uncontrolled type 2 diabetes w/ polyneuropathy Patient reports compliance with insulins but notes that he only eats twice a day therefore only injects the NovoLog twice a day.  He has not followed up with Dr. Dorris Fetch.  Denies any visual disturbance, chest pain, shortness of breath.  He has neuropathy that sometimes feels uncontrolled despite use of gabapentin 3 times daily.  He does report lower extremity edema that seems to be worse over the last 2 to 3 weeks.  He eats "baked foods".  However, after further discussion it appears that he has been eating frozen preprepared foods like fish sticks.  2.  COPD Patient reports daily use of albuterol and is asking for a nebulizer machine today.  He is not on any controller medication.  He does not recall ever being on a controller medicine.  He reports chronic cough.  ROS: Per HPI  No Known Allergies Past Medical History:  Diagnosis Date  . Anxiety   . Asthma   . CKD stage 3 due to type 2 diabetes mellitus ([place])   . COPD (chronic obstructive pulmonary disease) ([place])   . Coronary artery disease    s/p BMS to [facility] 10/2010;  Cath 01/22/12 prox 30-40% LAD, LCx ramus w/ hazy 70-80% in-stent restenosis, EF 60% treated medically  . Coronary artery disease    s/p BMS to [facility] 10/2010;  Cath 01/22/12 prox 30-40% LAD, LCx ramus w/ hazy 70-80% in-stent restenosis, EF 60% treated medically   . Depression   . Diabetes mellitus    Type 2  . Gastroparesis 05/2013  . GERD (gastroesophageal reflux disease)   . HTN (hypertension)   . Hyperlipidemia   . Hyperlipidemia   . Major depression, chronic 08/13/2012  . Melanoma ([place]) 2007   surgery at [facility], Followed by [facility]  . Myocardial infarction ([place]) 2012  . Obesity   . Tobacco abuse   . Tubular adenoma   . Urinary retention     Current Outpatient Medications:  .   acetaminophen (TYLENOL) 325 MG tablet, Take 2 tablets (650 mg total) by mouth every 6 (six) hours as needed for mild pain, moderate pain or fever (or Fever >/= 101)., Disp: 30 tablet, Rfl: 2 .  albuterol (PROVENTIL) (2.5 MG/3ML) 0.083% nebulizer solution, Take 3 mLs (2.5 mg total) by nebulization every 6 (six) hours as needed for wheezing or shortness of breath. Dx---44.1, Disp: 540 mL, Rfl: 0 .  albuterol (VENTOLIN HFA) 108 (90 Base) MCG/ACT inhaler, 2 PUFFS INTO THE LUNGS EVERY 6 HOURS AS NEEDED FOR WHEEZING. Needs to be seen for further refills., Disp: 8.5 g, Rfl: 0 .  apixaban (ELIQUIS) 5 MG TABS tablet, Take 1 tablet (5 mg total) by mouth 2 (two) times daily., Disp: 60 tablet, Rfl: 11 .  aspirin EC 81 MG tablet, Take 1 tablet (81 mg total) by mouth daily with breakfast., Disp: 30 tablet, Rfl: 5 .  Blood Glucose Monitoring Suppl (ACCU-CHEK GUIDE) w/Device KIT, 1 Piece by Does not apply route as directed., Disp: 1 kit, Rfl: 0 .  busPIRone (BUSPAR) 7.5 MG tablet, TAKE (1) TABLET TWICE DAILY., Disp: 60 tablet, Rfl: 0 .  famotidine (PEPCID) 20 MG tablet, Take 20 mg by mouth 2 (two) times daily., Disp: , Rfl:  .  gabapentin (NEURONTIN) 600 MG tablet, Take 1 tablet (600 mg total) by mouth 3 (three) times  daily. (Needs to be seen before next refill), Disp: 90 tablet, Rfl: 0 .  glucose blood (ACCU-CHEK GUIDE) test strip, Use as instructed, Disp: 150 each, Rfl: 2 .  hydrOXYzine (VISTARIL) 25 MG capsule, Take 1 capsule (25 mg total) by mouth 3 (three) times daily as needed., Disp: 30 capsule, Rfl: 0 .  insulin aspart (NOVOLOG FLEXPEN) 100 UNIT/ML FlexPen, Inject 10 Units into the skin 3 (three) times daily before meals., Disp: 5 pen, Rfl: 2 .  Insulin Glargine (LANTUS SOLOSTAR) 100 UNIT/ML Solostar Pen, Inject 60 Units into the skin every evening., Disp: 3 mL, Rfl: 2 .  lisinopril (ZESTRIL) 5 MG tablet, Take 1 tablet (5 mg total) by mouth at bedtime. Needs to be seen for further refills., Disp: 90 tablet,  Rfl: 0 .  metFORMIN (GLUCOPHAGE) 500 MG tablet, Take 1 tablet (500 mg total) by mouth 2 (two) times daily with a meal., Disp: 180 tablet, Rfl: 1 .  nitroGLYCERIN (NITROSTAT) 0.4 MG SL tablet, Place 1 tablet (0.4 mg total) under the tongue every 5 (five) minutes as needed. For chest pain., Disp: 25 tablet, Rfl: 3 .  tiZANidine (ZANAFLEX) 4 MG tablet, TAKE 1/2 TO 1 TABLET BY MOUTH EVERY 8 HOURS AS NEEDED FOR MUSCLE SPASMS., Disp: 30 tablet, Rfl: 3 .  traZODone (DESYREL) 150 MG tablet, Take 1 tablet (150 mg total) by mouth at bedtime., Disp: 90 tablet, Rfl: 1 .  triamcinolone cream (KENALOG) 0.1 %, Apply 1 application topically 2 (two) times daily., Disp: 30 g, Rfl: 0 Social History   Socioeconomic History  . Marital status: Divorced    Spouse name: rayann Shingledecker  . Number of children: 0  . Years of education: Not on file  . Highest education level: Not on file  Occupational History  . Occupation: disabled  Tobacco Use  . Smoking status: Current Every Day Smoker    Packs/day: 1.00    Years: 31.00    [place] years: 31.00    Types: Cigarettes    Start date: 10/09/1981  . Smokeless tobacco: Never Used  . Tobacco comment: 1/2 [place] daily 11/01/17  Substance and Sexual Activity  . 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  . Drug use: No  . Sexual activity: Yes  Other Topics Concern  . Not on file  Social History Narrative   Lives at home in an apartment alone. He is divorced and does not have any children. He has two brothers but is estranged from one. He only talks with the other one infrequently. He does have contact with his ex wife and they have a good relationship. They help each other out.    Social Determinants of Health   Financial Resource Strain:   . Difficulty of Paying Living Expenses:   Food Insecurity:   . Worried About Charity fundraiser in the Last  Year:   . Arboriculturist in the Last Year:   Transportation Needs:   . Film/video editor (Medical):   [name] Lack of Transportation (Non-Medical):   Physical Activity:   . Days of Exercise per Week:   . Minutes of Exercise per Session:   Stress:   . Feeling of Stress :   Social Connections:   . Frequency of Communication with Friends and Family:   . Frequency of Social Gatherings with Friends and Family:   .  Attends Religious Services:   . Active Member of Clubs or Organizations:   . Attends Archivist Meetings:   [name] Marital Status:   Intimate Partner Violence:   . Fear of Current or Ex-Partner:   . Emotionally Abused:   [name] Physically Abused:   . Sexually Abused:    Family History  Problem Relation Age of Onset  . Stroke Mother   . Alcohol abuse Father   . Heart disease Other   . Other Other        not real familiar with family history  . Lung cancer Other   . Colon cancer Neg Hx   . Colon polyps Neg Hx     Objective: Office vital signs reviewed. BP 127/75   Pulse 85   Temp 97.9 F (36.6 C)   Ht '6\' 2"'  (1.88 m)   Wt 191 lb 6.4 oz (86.8 kg)   SpO2 95%   BMI 24.57 kg/m   Physical Examination:  General: Awake, alert, chronically ill appearing male. No acute distress HEENT: sclera injected. Cardio: Irregularly irregular with rate control.  No appreciable murmurs. Pulm: Globally decreased breath sounds with fair air movement.  Normal work of breathing on room air MSK: Ambulating independently   Assessment/ Plan: 54 y.o. male   1. Type 2 diabetes mellitus with stage 3a chronic kidney disease, with long-term current use of insulin ([place]) Not controlled.  I will CC his endocrinologist with recent lab result.  His insulins have been renewed. - Bayer DCA Hb A1c Waived  2. Leg edema Likely due to dietary issues.  I reinforced that frozen foods, especially prepackaged, prepared frozen foods often have quite a bit of salt to preserve.  Recommended fresh fruits,  fresh vegetables and soft preparation of meals. - TSH - CMP14+EGFR - CBC  3. Simple chronic bronchitis ([place]) Trial of Dulera.  DME sent to [facility] as per requested. - For home use only DME Nebulizer machine   Orders Placed This Encounter  Procedures  . For home use only DME Nebulizer machine    Order Specific Question:   Patient needs a nebulizer to treat with the following condition    Answer:   COPD (chronic obstructive pulmonary disease) ([place]) [220254]    Order Specific Question:   Length of Need    Answer:   Lifetime  . Bayer DCA Hb A1c Waived  . TSH  . CMP14+EGFR  . CBC   Meds ordered this encounter  Medications  . mometasone-formoterol (DULERA) 100-5 MCG/ACT AERO    Sig: Inhale 2 puffs into the lungs in the morning and at bedtime.    Dispense:  8.8 g    Refill:  0  . busPIRone (BUSPAR) 7.5 MG tablet    Sig: TAKE (1) TABLET TWICE DAILY.    Dispense:  60 tablet    Refill:  5  . gabapentin (NEURONTIN) 600 MG tablet    Sig: Take 1 tablet (600 mg total) by mouth 3 (three) times daily.    Dispense:  90 tablet    Refill:  5  . insulin aspart (NOVOLOG FLEXPEN) 100 UNIT/ML FlexPen    Sig: Inject 10 Units into the skin 3 (three) times daily before meals.    Dispense:  5 pen    Refill:  2  . insulin glargine (LANTUS SOLOSTAR) 100 UNIT/ML Solostar Pen    Sig: Inject 60 Units into the skin every evening.    Dispense:  3 mL    Refill:  2  .  tiZANidine (ZANAFLEX) 4 MG tablet    Sig: TAKE 1/2 TO 1 TABLET BY MOUTH EVERY 8 HOURS AS NEEDED FOR MUSCLE SPASMS.    Dispense:  30 tablet    Refill:  [place], [place] [phone]

## 2020-03-18 ENCOUNTER — Other Ambulatory Visit: Payer: Self-pay | Admitting: *Deleted

## 2020-03-18 LAB — CBC
Hematocrit: 45.5 % (ref 37.5–51.0)
Hemoglobin: 15.2 g/dL (ref 13.0–17.7)
MCH: 32.1 pg (ref 26.6–33.0)
MCHC: 33.4 g/dL (ref 31.5–35.7)
MCV: 96 fL (ref 79–97)
Platelets: 331 10*3/uL (ref 150–450)
RBC: 4.74 x10E6/uL (ref 4.14–5.80)
RDW: 12.3 % (ref 11.6–15.4)
WBC: 7.8 10*3/uL (ref 3.4–10.8)

## 2020-03-18 LAB — CMP14+EGFR
ALT: 20 IU/L (ref 0–44)
AST: 15 IU/L (ref 0–40)
Albumin/Globulin Ratio: 1.5 (ref 1.2–2.2)
Albumin: 3.1 g/dL — ABNORMAL LOW (ref 3.8–4.9)
Alkaline Phosphatase: 89 IU/L (ref 48–121)
BUN/Creatinine Ratio: 13 (ref 9–20)
BUN: 16 mg/dL (ref 6–24)
Bilirubin Total: <0.2 mg/dL (ref 0.0–1.2)
CO2: 23 mmol/L (ref 20–29)
Calcium: 9 mg/dL (ref 8.7–10.2)
Chloride: 87 mmol/L — ABNORMAL LOW (ref 96–106)
Creatinine, Ser: 1.23 mg/dL (ref 0.76–1.27)
GFR calc Af Amer: 76 mL/min/{1.73_m2} (ref >59)
GFR calc non Af Amer: 66 mL/min/{1.73_m2} (ref >59)
Globulin, Total: 2.1 g/dL (ref 1.5–4.5)
Glucose: 669 mg/dL (ref 65–99)
Potassium: 4.5 mmol/L (ref 3.5–5.2)
Sodium: 125 mmol/L — ABNORMAL LOW (ref 134–144)
Total Protein: 5.2 g/dL — ABNORMAL LOW (ref 6.0–8.5)

## 2020-03-18 LAB — TSH: TSH: 0.689 u[IU]/mL (ref 0.450–4.500)

## 2020-03-18 MED ORDER — LANTUS SOLOSTAR 100 UNIT/ML ~~LOC~~ SOPN: 60.0000 [IU] | PEN_INJECTOR | Freq: Every evening | SUBCUTANEOUS | 2 refills | Status: DC

## 2020-03-18 NOTE — Telephone Encounter (Signed)
Fax from [facility] request for 18 ml = 6 pens = 30 days Updated Rx sent to pharmacy

## 2020-03-19 ENCOUNTER — Telehealth: Payer: Self-pay | Admitting: Family Medicine

## 2020-03-24 ENCOUNTER — Ambulatory Visit: Payer: Medicare Other | Admitting: Family Medicine

## 2020-03-29 ENCOUNTER — Telehealth: Payer: Self-pay | Admitting: *Deleted

## 2020-03-29 MED ORDER — NOVOLOG FLEXPEN 100 UNIT/ML ~~LOC~~ SOPN: 10.0000 [IU] | PEN_INJECTOR | Freq: Three times a day (TID) | SUBCUTANEOUS | 2 refills | Status: DC

## 2020-03-29 NOTE — Telephone Encounter (Signed)
Form came in today from [facility] Review:  Last fill 03/17/20 Insulin Aspart 100 U/ml Pen (Novolog)  Per alternatives given as options to give, Name brand Novolog insulin and name brand novolog flexpen was listed.  I have updated original order to state name brand and kept directions the same and re-sent it in.

## 2020-03-30 ENCOUNTER — Other Ambulatory Visit: Payer: Self-pay | Admitting: Family Medicine

## 2020-03-30 MED ORDER — ATORVASTATIN CALCIUM 80 MG PO TABS: 80.0000 mg | ORAL_TABLET | Freq: Every day | ORAL | 3 refills | Status: DC

## 2020-04-07 ENCOUNTER — Other Ambulatory Visit: Payer: Self-pay | Admitting: Family Medicine

## 2020-04-07 DIAGNOSIS — R059 Cough, unspecified: Secondary | ICD-10-CM

## 2020-05-19 ENCOUNTER — Ambulatory Visit: Payer: Medicare HMO

## 2020-05-26 ENCOUNTER — Ambulatory Visit: Payer: Medicare HMO

## 2020-05-26 ENCOUNTER — Other Ambulatory Visit: Payer: Self-pay

## 2020-05-26 DIAGNOSIS — Z23 Encounter for immunization: Secondary | ICD-10-CM

## 2020-05-26 NOTE — Progress Notes (Signed)
   Covid-19 Vaccination Clinic  Name:  Dustin Cruz    MRN: [id number] DOB: [birth year]  05/26/2020  Dustin Cruz was observed post Covid-19 immunization for 15 minutes without incident. He was provided with Vaccine Information Sheet and instruction to access the V-Safe system.   Dustin Cruz was instructed to call 911 with any severe reactions post vaccine: [name] Difficulty breathing  . Swelling of face and throat  . A fast heartbeat  . A bad rash all over body  . Dizziness and weakness   Immunizations Administered    Name Date Dose VIS Date Route   Moderna COVID-19 Vaccine 05/26/2020 10:57 AM 0.5 mL 09/2019 Intramuscular   Manufacturer: Moderna   Lot: [id number]   [place]: [id number]

## 2020-05-30 ENCOUNTER — Emergency Department (HOSPITAL_COMMUNITY)
Admission: EM | Admit: 2020-05-30 | Discharge: 2020-05-30 | Disposition: A | Discharge status: Home / Self Care | Payer: Medicare HMO | Attending: Emergency Medicine | Admitting: Emergency Medicine

## 2020-05-30 ENCOUNTER — Encounter (HOSPITAL_COMMUNITY): Payer: Self-pay | Admitting: *Deleted

## 2020-05-30 ENCOUNTER — Other Ambulatory Visit: Payer: Self-pay

## 2020-05-30 ENCOUNTER — Emergency Department (HOSPITAL_COMMUNITY): Payer: Medicare HMO

## 2020-05-30 DIAGNOSIS — E1165 Type 2 diabetes mellitus with hyperglycemia: Secondary | ICD-10-CM | POA: Insufficient documentation

## 2020-05-30 DIAGNOSIS — I129 Hypertensive chronic kidney disease with stage 1 through stage 4 chronic kidney disease, or unspecified chronic kidney disease: Secondary | ICD-10-CM | POA: Diagnosis not present

## 2020-05-30 DIAGNOSIS — Z79899 Other long term (current) drug therapy: Secondary | ICD-10-CM | POA: Diagnosis not present

## 2020-05-30 DIAGNOSIS — E1122 Type 2 diabetes mellitus with diabetic chronic kidney disease: Secondary | ICD-10-CM | POA: Diagnosis not present

## 2020-05-30 DIAGNOSIS — J449 Chronic obstructive pulmonary disease, unspecified: Secondary | ICD-10-CM | POA: Diagnosis not present

## 2020-05-30 DIAGNOSIS — M79604 Pain in right leg: Secondary | ICD-10-CM | POA: Diagnosis not present

## 2020-05-30 DIAGNOSIS — E11319 Type 2 diabetes mellitus with unspecified diabetic retinopathy without macular edema: Secondary | ICD-10-CM | POA: Insufficient documentation

## 2020-05-30 DIAGNOSIS — I2510 Atherosclerotic heart disease of native coronary artery without angina pectoris: Secondary | ICD-10-CM | POA: Diagnosis not present

## 2020-05-30 DIAGNOSIS — Z7951 Long term (current) use of inhaled steroids: Secondary | ICD-10-CM | POA: Insufficient documentation

## 2020-05-30 DIAGNOSIS — R531 Weakness: Secondary | ICD-10-CM | POA: Diagnosis not present

## 2020-05-30 DIAGNOSIS — N183 Chronic kidney disease, stage 3 unspecified: Secondary | ICD-10-CM | POA: Diagnosis not present

## 2020-05-30 DIAGNOSIS — I700 Atherosclerosis of aorta: Secondary | ICD-10-CM | POA: Diagnosis not present

## 2020-05-30 DIAGNOSIS — R7309 Other abnormal glucose: Secondary | ICD-10-CM

## 2020-05-30 DIAGNOSIS — Z794 Long term (current) use of insulin: Secondary | ICD-10-CM | POA: Insufficient documentation

## 2020-05-30 DIAGNOSIS — M79605 Pain in left leg: Secondary | ICD-10-CM | POA: Diagnosis not present

## 2020-05-30 DIAGNOSIS — F17210 Nicotine dependence, cigarettes, uncomplicated: Secondary | ICD-10-CM | POA: Diagnosis not present

## 2020-05-30 DIAGNOSIS — M6281 Muscle weakness (generalized): Secondary | ICD-10-CM | POA: Diagnosis present

## 2020-05-30 LAB — CBC WITH DIFFERENTIAL/PLATELET
Abs Immature Granulocytes: 0.02 10*3/uL (ref 0.00–0.07)
Basophils Absolute: 0.1 10*3/uL (ref 0.0–0.1)
Basophils Relative: 1 %
Eosinophils Absolute: 0.1 10*3/uL (ref 0.0–0.5)
Eosinophils Relative: 1 %
HCT: 49.8 % (ref 39.0–52.0)
Hemoglobin: 17.4 g/dL — ABNORMAL HIGH (ref 13.0–17.0)
Immature Granulocytes: 0 %
Lymphocytes Relative: 22 %
Lymphs Abs: 1.8 10*3/uL (ref 0.7–4.0)
MCH: 32.6 pg (ref 26.0–34.0)
MCHC: 34.9 g/dL (ref 30.0–36.0)
MCV: 93.4 fL (ref 80.0–100.0)
Monocytes Absolute: 0.8 10*3/uL (ref 0.1–1.0)
Monocytes Relative: 9 %
Neutro Abs: 5.6 10*3/uL (ref 1.7–7.7)
Neutrophils Relative %: 67 %
Platelets: 297 10*3/uL (ref 150–400)
RBC: 5.33 MIL/uL (ref 4.22–5.81)
RDW: 12.4 % (ref 11.5–15.5)
WBC: 8.3 10*3/uL (ref 4.0–10.5)
nRBC: 0 % (ref 0.0–0.2)

## 2020-05-30 LAB — COMPREHENSIVE METABOLIC PANEL
ALT: 19 U/L (ref 0–44)
AST: 19 U/L (ref 15–41)
Albumin: 3.1 g/dL — ABNORMAL LOW (ref 3.5–5.0)
Alkaline Phosphatase: 82 U/L (ref 38–126)
Anion gap: 12 (ref 5–15)
BUN: 19 mg/dL (ref 6–20)
CO2: 24 mmol/L (ref 22–32)
Calcium: 8.8 mg/dL — ABNORMAL LOW (ref 8.9–10.3)
Chloride: 87 mmol/L — ABNORMAL LOW (ref 98–111)
Creatinine, Ser: 1.21 mg/dL (ref 0.61–1.24)
GFR calc Af Amer: >60 mL/min (ref >60)
GFR calc non Af Amer: >60 mL/min (ref >60)
Glucose, Bld: 744 mg/dL (ref 70–99)
Potassium: 4.3 mmol/L (ref 3.5–5.1)
Sodium: 123 mmol/L — ABNORMAL LOW (ref 135–145)
Total Bilirubin: 0.5 mg/dL (ref 0.3–1.2)
Total Protein: 6.4 g/dL — ABNORMAL LOW (ref 6.5–8.1)

## 2020-05-30 LAB — BASIC METABOLIC PANEL
Anion gap: 9 (ref 5–15)
BUN: 16 mg/dL (ref 6–20)
CO2: 26 mmol/L (ref 22–32)
Calcium: 8.5 mg/dL — ABNORMAL LOW (ref 8.9–10.3)
Chloride: 96 mmol/L — ABNORMAL LOW (ref 98–111)
Creatinine, Ser: 1.08 mg/dL (ref 0.61–1.24)
GFR calc Af Amer: >60 mL/min (ref >60)
GFR calc non Af Amer: >60 mL/min (ref >60)
Glucose, Bld: 335 mg/dL — ABNORMAL HIGH (ref 70–99)
Potassium: 3.6 mmol/L (ref 3.5–5.1)
Sodium: 131 mmol/L — ABNORMAL LOW (ref 135–145)

## 2020-05-30 LAB — CBG MONITORING, ED: Glucose-Capillary: 392 mg/dL — ABNORMAL HIGH (ref 70–99)

## 2020-05-30 MED ORDER — INSULIN ASPART 100 UNIT/ML ~~LOC~~ SOLN
SUBCUTANEOUS | Status: AC
  Filled 2020-05-30: qty 1

## 2020-05-30 MED ORDER — SODIUM CHLORIDE 0.9 % IV BOLUS
1000.0000 mL | Freq: Once | INTRAVENOUS | Status: AC
  Administered 2020-05-30: 1000 mL via INTRAVENOUS

## 2020-05-30 MED ORDER — INSULIN ASPART 100 UNIT/ML IV SOLN
10.0000 [IU] | Freq: Once | INTRAVENOUS | Status: AC
  Administered 2020-05-30: 10 [IU] via INTRAVENOUS

## 2020-05-30 MED ORDER — INSULIN ASPART 100 UNIT/ML IV SOLN
20.0000 [IU] | Freq: Once | INTRAVENOUS | Status: AC
  Administered 2020-05-30: 20 [IU] via SUBCUTANEOUS

## 2020-05-30 MED ORDER — IOHEXOL 350 MG/ML SOLN
80.0000 mL | Freq: Once | INTRAVENOUS | Status: AC | PRN
  Administered 2020-05-30: 80 mL via INTRAVENOUS

## 2020-05-30 NOTE — ED Notes (Signed)
CRITICAL VALUE ALERT  Critical Value:  Glucose 744  Date & Time Notied:  05/30/20 @ 6759  Provider Notified: DR. Roderic Palau  Orders Received/Actions taken: no/na

## 2020-05-30 NOTE — ED Provider Notes (Signed)
[facility] EMERGENCY DEPARTMENT Provider Note   CSN: [id number] Arrival date & time: 05/30/20  1326     History Chief Complaint  Patient presents with   Extremity Weakness    CHAEL URENDA is a 54 y.o. male.  Patient complains of numbness in his lower extremities.  Patient has diabetes is poorly controlled  The history is provided by the patient. No language interpreter was used.  Extremity Weakness This is a chronic problem. The current episode started more than 1 week ago. The problem occurs constantly. The problem has not changed since onset.Pertinent negatives include no chest pain, no abdominal pain and no headaches. Nothing aggravates the symptoms. Nothing relieves the symptoms. He has tried nothing for the symptoms. The treatment provided no relief.       Past Medical History:  Diagnosis Date   Anxiety    Asthma    CKD stage 3 due to type 2 diabetes mellitus ([place])    COPD (chronic obstructive pulmonary disease) ([place])    Coronary artery disease    s/p BMS to Ramus 10/2010;  Cath 01/22/12 prox 30-40% LAD, LCx ramus w/ hazy 70-80% in-stent restenosis, EF 60% treated medically   Coronary artery disease    s/p BMS to Ramus 10/2010;  Cath 01/22/12 prox 30-40% LAD, LCx ramus w/ hazy 70-80% in-stent restenosis, EF 60% treated medically    Depression    Diabetes mellitus    Type 2   Gastroparesis 05/2013   GERD (gastroesophageal reflux disease)    HTN (hypertension)    Hyperlipidemia    Hyperlipidemia    Major depression, chronic 08/13/2012   Melanoma ([place]) 2007   surgery at [facility], Followed by [facility]   Myocardial infarction [facility]) 2012   Obesity    Tobacco abuse    Tubular adenoma    Urinary retention     Patient Active Problem List   Diagnosis Date Noted   Pre-ulcerative calluses 10/29/2019   Musculoskeletal pain 10/13/2019   Housing problems 05/23/2019   Essential hypertension, benign 03/20/2019   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   Near  syncope 03/17/2019   AKI (acute kidney injury) ([place]) 03/17/2019   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   CKD stage 3 due to type 2 diabetes mellitus ([place]) 11/28/2018   Microalbuminuria due to type 2 diabetes mellitus ([place]) 05/22/2018   Hyperglycemia 04/24/2018   Abdominal pain, epigastric 11/29/2017   Diarrhea 11/29/2017   History of colonic polyps 11/29/2017   Diffuse abdominal pain 11/29/2017   Diabetic retinopathy ([place]) 09/21/2015   Carotid bruit 09/21/2015   Viral gastroenteritis 06/03/2015   Hypertriglyceridemia 05/18/2015   Vitamin D deficiency 12/29/2013   Nausea alone 04/24/2013   Frequency of urination 04/14/2013   Fever, unspecified 04/14/2013   Hepatic steatosis 12/28/2012   Peripheral neuropathy 11/25/2012   Melanoma of skin, site unspecified 11/25/2012   Mixed hyperlipidemia    Depression    Hypertension associated with diabetes ([place])    GERD (gastroesophageal reflux disease) 04/03/2012   Current smoker 03/14/2012   Type 2 diabetes mellitus with stage 3a chronic kidney disease, with long-term current use of insulin ([place])    Arteriosclerotic cardiovascular disease (ASCVD) 01/06/2011    Past Surgical History:  Procedure Laterality Date   BIOPSY  01/07/2018   Procedure: BIOPSY;  Surgeon: Daneil Dolin, MD;  Location: [facility] ENDO SUITE;  Service: Endoscopy;;  duodenal and gastric biopsy   BIOPSY  04/25/2018   Procedure: BIOPSY;  Surgeon: Daneil Dolin, MD;  Location: [facility];  Service: Endoscopy;;  ascending and descending and sigmoid biopsies   CARDIAC CATHETERIZATION     with stent   COLONOSCOPY N/A 01/27/2013   LDJ:TTSVXB and colonic polyps. Tubular adenomas, poor bowel prep, one-year follow-up surveillance colonoscopy recommended   COLONOSCOPY WITH PROPOFOL N/A 04/25/2018   Procedure: COLONOSCOPY WITH PROPOFOL;  Surgeon: Daneil Dolin, MD;  Location: [facility];  Service: Endoscopy;  Laterality: N/A;  11:00am - pt to be prepped INPT  and labs to be done as well   CORONARY ANGIOPLASTY WITH STENT PLACEMENT     ESOPHAGOGASTRODUODENOSCOPY  04/24/2012   [place]ourk-mild erosive reflux esophagitis,dilated w/36F Venia Minks, small HH, minimal chronic gastric/bulbar erosions(No H pylori)   ESOPHAGOGASTRODUODENOSCOPY (EGD) WITH PROPOFOL N/A 01/07/2018   Dr. Gala Romney: Erythematous mucosa in the stomach, retained gastric contents, incomplete exam.  Biopsied showed mild chronic gastritis but no H. pylori.  Duodenal biopsy was negative for celiac disease.   FLEXIBLE SIGMOIDOSCOPY N/A 01/07/2018   Dr. Gala Romney: Incomplete colonoscopy due to inadequate bowel prep   LEFT HEART CATHETERIZATION WITH CORONARY ANGIOGRAM N/A 01/22/2012   Procedure: LEFT HEART CATHETERIZATION WITH CORONARY ANGIOGRAM;  Surgeon: Jolaine Artist, MD;  Location: [facility] CATH LAB;  Service: Cardiovascular;  Laterality: N/A;   melanoma surgery  2007   [place], removed lymph nodes under arm as well,Left abd       Family History  Problem Relation Age of Onset   Stroke Mother    Alcohol abuse Father    Heart disease Other    Other Other        not real familiar with family history   Lung cancer Other    Colon cancer Neg Hx    Colon polyps Neg Hx     Social History   Tobacco Use   Smoking status: Current Every Day Smoker    Packs/day: 1.00    Years: 31.00    [place] years: 31.00    Types: Cigarettes    Start date: 10/09/1981   Smokeless tobacco: Never Used   Tobacco comment: 1/2 [place] daily 11/01/17  Vaping Use   Vaping Use: Never used  Substance Use Topics   Alcohol use: No    Alcohol/week: 0.0 standard drinks    Comment: 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   Drug use: No    Home Medications Prior to Admission medications   Medication Sig Start Date End Date Taking? Authorizing Provider  acetaminophen (TYLENOL) 325 MG tablet Take 2 tablets (650 mg total) by mouth every 6 (six) hours as needed  for mild pain, moderate pain or fever (or Fever >/= 101). 03/21/19  Yes Emokpae, Courage, MD  albuterol (PROVENTIL) (2.5 MG/3ML) 0.083% nebulizer solution Take 3 mLs (2.5 mg total) by nebulization every 6 (six) hours as needed for wheezing or shortness of breath. Dx---44.1 02/16/20  Yes Gottschalk, Ashly M, DO  albuterol (VENTOLIN HFA) 108 (90 Base) MCG/ACT inhaler 2 PUFFS INTO THE LUNGS EVERY 6 HOURS AS NEEDED FOR WHEEZING. 04/08/20  Yes Gottschalk, Ashly M, DO  apixaban (ELIQUIS) 5 MG TABS tablet Take 1 tablet (5 mg total) by mouth 2 (two) times daily. 07/16/19  Yes Strader, Fransisco Hertz, PA-C  aspirin EC 81 MG tablet Take 1 tablet (81 mg total) by mouth daily with breakfast. 03/21/19  Yes Emokpae, Courage, MD  atorvastatin (LIPITOR) 80 MG tablet Take 1 tablet (80 mg total) by mouth daily. 03/30/20  Yes Gottschalk, Leatrice Jewels M, DO  Blood Glucose Monitoring Suppl (ACCU-CHEK GUIDE)  w/Device KIT 1 Piece by Does not apply route as directed. 11/12/19  Yes Nida, Marella Chimes, MD  busPIRone (BUSPAR) 7.5 MG tablet TAKE (1) TABLET TWICE DAILY. 03/17/20  Yes Gottschalk, Ashly M, DO  famotidine (PEPCID) 20 MG tablet Take 20 mg by mouth 2 (two) times daily. 12/22/19  Yes [provider]  gabapentin (NEURONTIN) 600 MG tablet Take 1 tablet (600 mg total) by mouth 3 (three) times daily. 03/17/20  Yes Ronnie Doss M, DO  glucose blood (ACCU-CHEK GUIDE) test strip Use as instructed 11/12/19  Yes Nida, Marella Chimes, MD  hydrOXYzine (VISTARIL) 25 MG capsule Take 1 capsule (25 mg total) by mouth 3 (three) times daily as needed. 12/18/19  Yes Rakes, Connye Burkitt, FNP  insulin glargine (LANTUS SOLOSTAR) 100 UNIT/ML Solostar Pen Inject 60 Units into the skin every evening. 03/18/20  Yes Gottschalk, Ashly M, DO  lisinopril (ZESTRIL) 5 MG tablet TAKE 1 TABLET BY MOUTH AT BEDTIME. 04/08/20  Yes Gottschalk, Ashly M, DO  metFORMIN (GLUCOPHAGE) 500 MG tablet Take 1 tablet (500 mg total) by mouth 2 (two) times daily with a meal. 11/12/19  Yes  Nida, Marella Chimes, MD  mometasone-formoterol (DULERA) 100-5 MCG/ACT AERO Inhale 2 puffs into the lungs in the morning and at bedtime. 03/17/20  Yes Gottschalk, Ashly M, DO  NOVOLOG FLEXPEN 100 UNIT/ML FlexPen Inject 10 Units into the skin 3 (three) times daily before meals. 03/29/20  Yes Gottschalk, Ashly M, DO  tiZANidine (ZANAFLEX) 4 MG tablet TAKE 1/2 TO 1 TABLET BY MOUTH EVERY 8 HOURS AS NEEDED FOR MUSCLE SPASMS. 03/17/20  Yes Gottschalk, Ashly M, DO  traZODone (DESYREL) 150 MG tablet Take 1 tablet (150 mg total) by mouth at bedtime. 02/03/20  Yes Ronnie Doss M, DO  triamcinolone cream (KENALOG) 0.1 % Apply 1 application topically 2 (two) times daily. 12/18/19  Yes Rakes, Connye Burkitt, FNP  nitroGLYCERIN (NITROSTAT) 0.4 MG SL tablet Place 1 tablet (0.4 mg total) under the tongue every 5 (five) minutes as needed. For chest pain. 08/13/12   Reece Packer, NP  metoprolol tartrate (LOPRESSOR) 25 MG tablet Take 25 mg by mouth 2 (two) times daily.    12/27/11  [provider]    Allergies    Patient has no known allergies.  Review of Systems   Review of Systems  Constitutional: Negative for appetite change and fatigue.  HENT: Negative for congestion, ear discharge and sinus pressure.   Eyes: Negative for discharge.  Respiratory: Negative for cough.   Cardiovascular: Negative for chest pain.  Gastrointestinal: Negative for abdominal pain and diarrhea.  Genitourinary: Negative for frequency and hematuria.  Musculoskeletal: Positive for extremity weakness. Negative for back pain.       Numbness and weakness to legs  Skin: Negative for rash.  Neurological: Negative for seizures and headaches.  Psychiatric/Behavioral: Negative for hallucinations.    Physical Exam Updated Vital Signs BP (!) 147/98 (BP Location: Left Arm)    Pulse 69    Temp 98 F (36.7 C) (Oral)    Resp 18    Ht _0  (1.88 m)    Wt 83.9 kg    SpO2 96%    BMI 23.75 kg/m   Physical Exam Vitals and nursing note  reviewed.  Constitutional:      Appearance: He is well-developed.  HENT:     Head: Normocephalic.     Nose: Nose normal.  Eyes:     General: No scleral icterus.    Conjunctiva/sclera: Conjunctivae normal.  Neck:  Thyroid: No thyromegaly.  Cardiovascular:     Rate and Rhythm: Normal rate and regular rhythm.     Heart sounds: No murmur heard.  No friction rub. No gallop.   Pulmonary:     Breath sounds: No stridor. No wheezing or rales.  Chest:     Chest wall: No tenderness.  Abdominal:     General: There is no distension.     Tenderness: There is no abdominal tenderness. There is no rebound.  Musculoskeletal:        General: Normal range of motion.     Cervical back: Neck supple.     Comments: Mild swelling distally in both legs.  Patient also has decreased sensation  Lymphadenopathy:     Cervical: No cervical adenopathy.  Skin:    Findings: No erythema or rash.  Neurological:     Mental Status: He is alert and oriented to person, place, and time.     Motor: No abnormal muscle tone.     Coordination: Coordination normal.  Psychiatric:        Behavior: Behavior normal.     ED Results / Procedures / Treatments   Labs (all labs ordered are listed, but only abnormal results are displayed) Labs Reviewed  CBC WITH DIFFERENTIAL/PLATELET - Abnormal; Notable for the following components:      Result Value   Hemoglobin 17.4 (*)    All other components within normal limits  COMPREHENSIVE METABOLIC PANEL - Abnormal; Notable for the following components:   Sodium 123 (*)    Chloride 87 (*)    Glucose, Bld 744 (*)    Calcium 8.8 (*)    Total Protein 6.4 (*)    Albumin 3.1 (*)    All other components within normal limits  BASIC METABOLIC PANEL - Abnormal; Notable for the following components:   Sodium 131 (*)    Chloride 96 (*)    Glucose, Bld 335 (*)    Calcium 8.5 (*)    All other components within normal limits  CBG MONITORING, ED - Abnormal; Notable for the  following components:   Glucose-Capillary 392 (*)    All other components within normal limits    EKG None  Radiology CT Angio Chest/Abd/Pel for Dissection W and/or Wo Contrast  Result Date: 05/30/2020 CLINICAL DATA:  Bilateral lower extremity pain and weakness EXAM: CT ANGIOGRAPHY CHEST, ABDOMEN AND PELVIS TECHNIQUE: Non-contrast CT of the chest was initially obtained. Multidetector CT imaging through the chest, abdomen and pelvis was performed using the standard protocol during bolus administration of intravenous contrast. Multiplanar reconstructed images and MIPs were obtained and reviewed to evaluate the vascular anatomy. CONTRAST:  85m OMNIPAQUE IOHEXOL 350 MG/ML SOLN COMPARISON:  03/17/2019 FINDINGS: CTA CHEST FINDINGS Cardiovascular: The thoracic aorta is unremarkable with no evidence of aneurysm or dissection. No pericardial effusion. The heart is not enlarged. Mediastinum/Nodes: No enlarged mediastinal, hilar, or axillary lymph nodes. Thyroid gland, trachea, and esophagus demonstrate no significant findings. Lungs/Pleura: No acute airspace disease, effusion, or pneumothorax. Central airways are patent. Musculoskeletal: No acute or destructive bony lesions. Reconstructed images demonstrate no additional findings. Review of the MIP images confirms the above findings. CTA ABDOMEN AND PELVIS FINDINGS VASCULAR Aorta: Normal caliber aorta without aneurysm, dissection, vasculitis or significant stenosis. Mild atherosclerosis. Celiac: Patent without evidence of aneurysm, dissection, vasculitis or significant stenosis. SMA: Patent without evidence of aneurysm, dissection, vasculitis or significant stenosis. Renals: Both renal arteries are patent without evidence of aneurysm, dissection, vasculitis, fibromuscular dysplasia or significant stenosis. IMA: Patent  without evidence of aneurysm, dissection, vasculitis or significant stenosis. Inflow: Patent without evidence of aneurysm, dissection, vasculitis or  significant stenosis. Mild atherosclerosis of the bilateral iliac bifurcations. Veins: No obvious venous abnormality within the limitations of this arterial phase study. Review of the MIP images confirms the above findings. NON-VASCULAR Hepatobiliary: No focal liver abnormality is seen. No gallstones, gallbladder wall thickening, or biliary dilatation. Pancreas: Unremarkable. No pancreatic ductal dilatation or surrounding inflammatory changes. Spleen: Normal in size without focal abnormality. Adrenals/Urinary Tract: Adrenal glands are unremarkable. Kidneys are normal, without renal calculi, focal lesion, or hydronephrosis. Bladder is unremarkable. Stomach/Bowel: No bowel obstruction or ileus. Normal appendix right lower quadrant. No bowel wall thickening or inflammatory change. Lymphatic: No pathologic adenopathy. Reproductive: Prostate is unremarkable. Other: No free fluid or free gas. No abdominal wall hernia. Musculoskeletal: No acute or destructive bony lesions. Reconstructed images demonstrate no additional findings. Review of the MIP images confirms the above findings. IMPRESSION: 1. No evidence of thoracic or abdominal aortic aneurysm or dissection. 2. Mild atherosclerosis of the aorta and bilateral iliac bifurcations, without flow limiting stenosis. (ICD10-I70.0). 3. No acute intrathoracic, intra-abdominal, or intrapelvic process. Electronically Signed   By: Sharlet Salina M.D.   On: 05/30/2020 18:27    Procedures Procedures (including critical care time)  Medications Ordered in ED Medications  sodium chloride 0.9 % bolus 1,000 mL (1,000 mLs Intravenous New Bag/Given 05/30/20 1747)  sodium chloride 0.9 % bolus 1,000 mL (1,000 mLs Intravenous New Bag/Given 05/30/20 1747)  insulin aspart (novoLOG) injection 10 Units (10 Units Intravenous Given 05/30/20 1806)  insulin aspart (novoLOG) injection 20 Units (20 Units Subcutaneous Given 05/30/20 1808)  iohexol (OMNIPAQUE) 350 MG/ML injection 80 mL (80 mLs  Intravenous Contrast Given 05/30/20 1755)    ED Course  I have reviewed the triage vital signs and the nursing notes.  Pertinent labs & imaging results that were available during my care of the patient were reviewed by me and considered in my medical decision making (see chart for details). CRITICAL CARE Performed by: Bethann Berkshire Total critical care time: 40  minutes Critical care time was exclusive of separately billable procedures and treating other patients. Critical care was necessary to treat or prevent imminent or life-threatening deterioration. Critical care was time spent personally by me on the following activities: development of treatment plan with patient and/or surrogate as well as nursing, discussions with consultants, evaluation of patient's response to treatment, examination of patient, obtaining history from patient or surrogate, ordering and performing treatments and interventions, ordering and review of laboratory studies, ordering and review of radiographic studies, pulse oximetry and re-evaluation of patient's condition.    MDM Rules/Calculators/A&P                          Peripheral neuropathy with poorly controlled diabetes.  Patient improved with fluids and insulin and will follow up with his PCP      This patient presents to the ED for concern of leg numbness this involves an extensive number of treatment options, and is a complaint that carries with it a high risk of complications and morbidity.  The differential diagnosis includes peripheral vascular disease poorly controlled diabetes   Lab Tests:  I Ordered, reviewed, and interpreted labs, which included CBC and chemistries that showed sodium 123 low.  Glucose 744 and mild elevated hemoglobin Medicines ordered:    I ordered medication insulin and normal saline for hyperglycemia  Imaging Studies ordered:   I ordered imaging  studies which included CT angio chest and abdomen  I independently  visualized and interpreted imaging which showed no acute disease  Additional history obtained:   Additional history obtained from records  Previous records obtained and reviewed.  Consultations Obtained:     Reevaluation:  After the interventions stated above, I reevaluated the patient and found improved  Critical Interventions:     Final Clinical Impression(s) / ED Diagnoses Final diagnoses:  Elevated glucose    Rx / DC Orders ED Discharge Orders    None       Milton Ferguson, MD 05/31/20 1106

## 2020-05-30 NOTE — ED Triage Notes (Signed)
Pt c/o left leg weakness, pain in bilateral feet that started a few weeks ago, worse over the past week,

## 2020-05-30 NOTE — ED Notes (Signed)
Pt in bed, pt moving all extremities, pt reports some decreased sensation in his L leg.

## 2020-05-30 NOTE — Discharge Instructions (Addendum)
Follow-up with your doctor this week for recheck of your glucose and better glucose control

## 2020-06-01 ENCOUNTER — Other Ambulatory Visit: Payer: Self-pay | Admitting: Family Medicine

## 2020-06-01 DIAGNOSIS — R059 Cough, unspecified: Secondary | ICD-10-CM

## 2020-06-06 DIAGNOSIS — H10023 Other mucopurulent conjunctivitis, bilateral: Secondary | ICD-10-CM | POA: Diagnosis not present

## 2020-06-06 DIAGNOSIS — J0100 Acute maxillary sinusitis, unspecified: Secondary | ICD-10-CM | POA: Diagnosis not present

## 2020-06-09 ENCOUNTER — Other Ambulatory Visit: Payer: Self-pay | Admitting: Family Medicine

## 2020-06-10 IMAGING — US US EXTREM LOW VENOUS*L*
1 series · 13 of 24 positions shown · non-contrast
Comparison: None.

CLINICAL DATA: Pain



[Series 1: us extrem low venous*left* · 0.08mm/px · 13 of 50 slices shown]
[im 1/50]
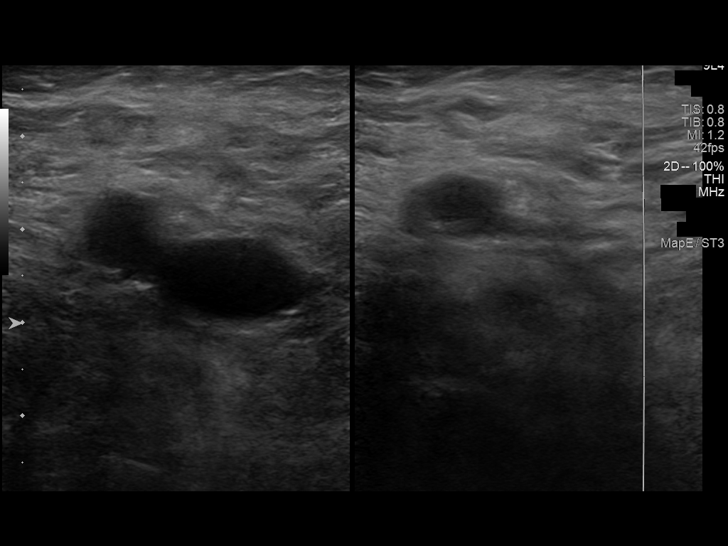
[im 5/50]
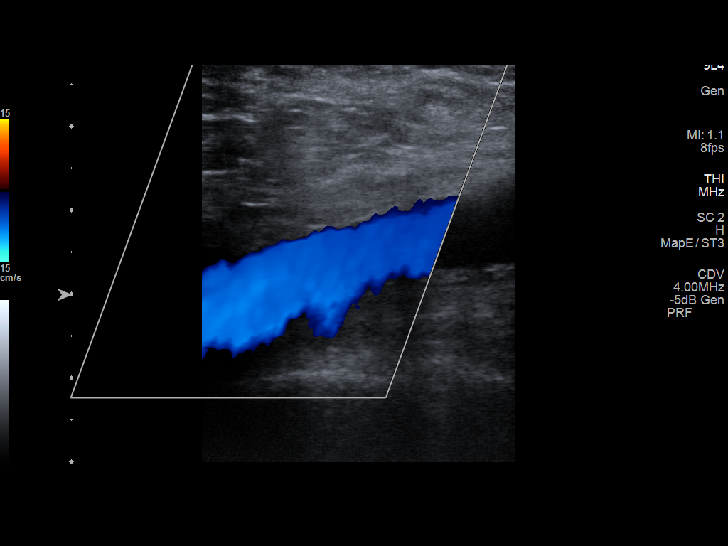
[im 9/50]
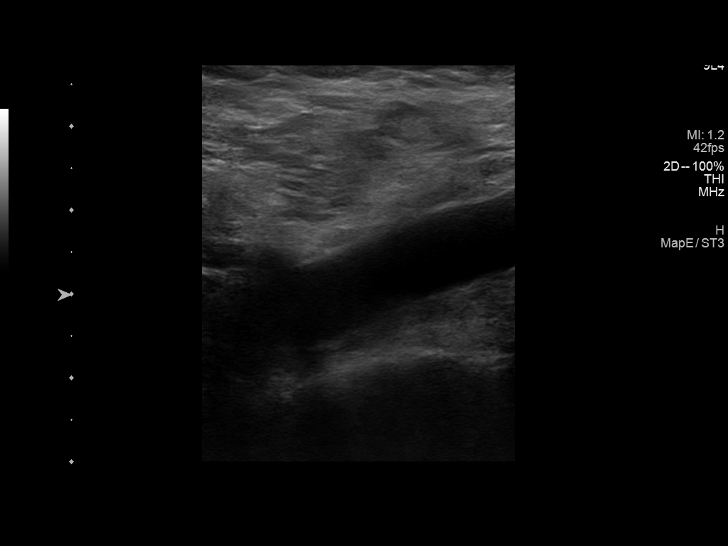
[im 13/50]
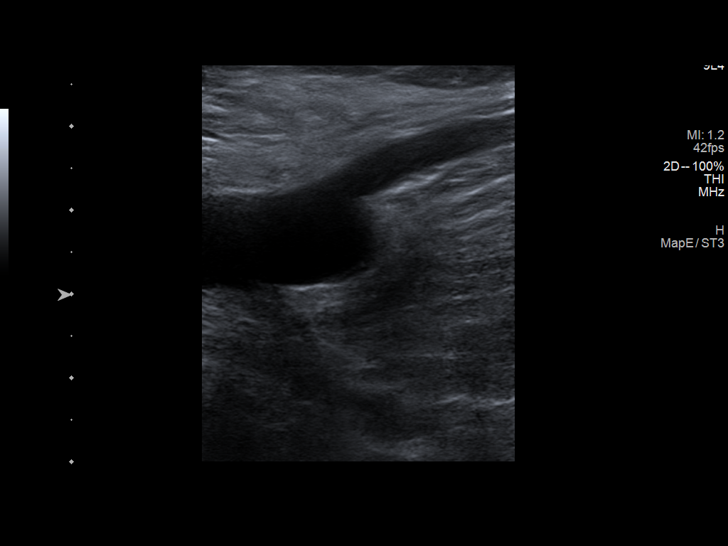
[im 18/50]
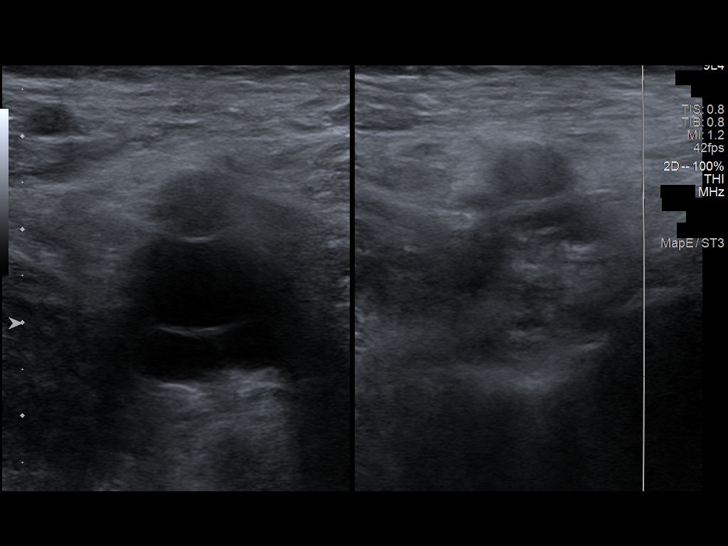
[im 22/50]
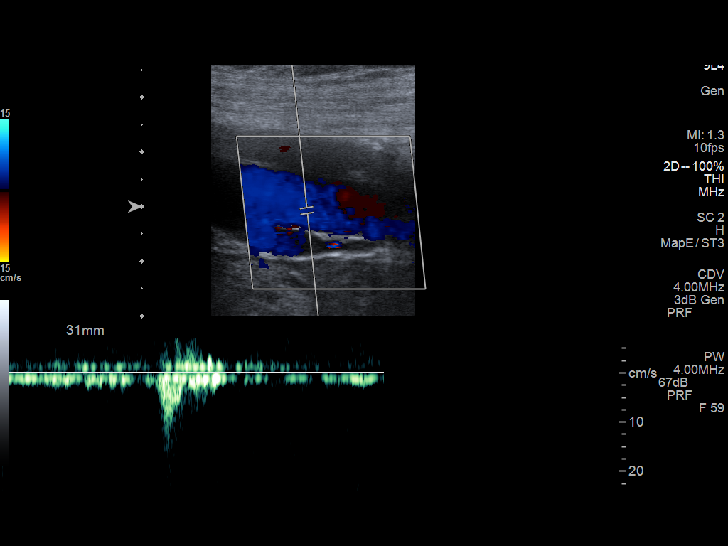
[im 26/50]
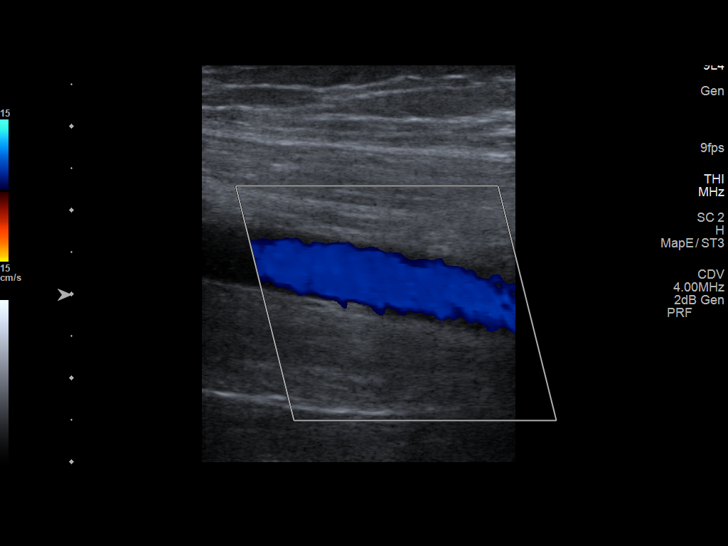
[im 28/50]
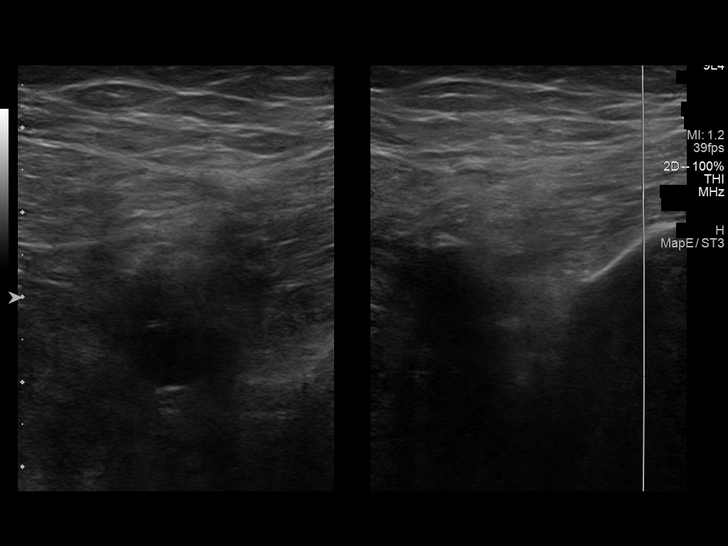
[im 32/50]
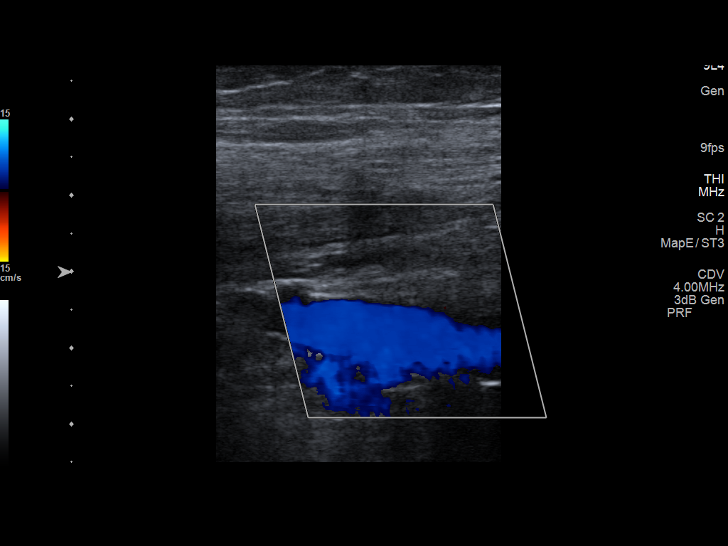
[im 37/50]
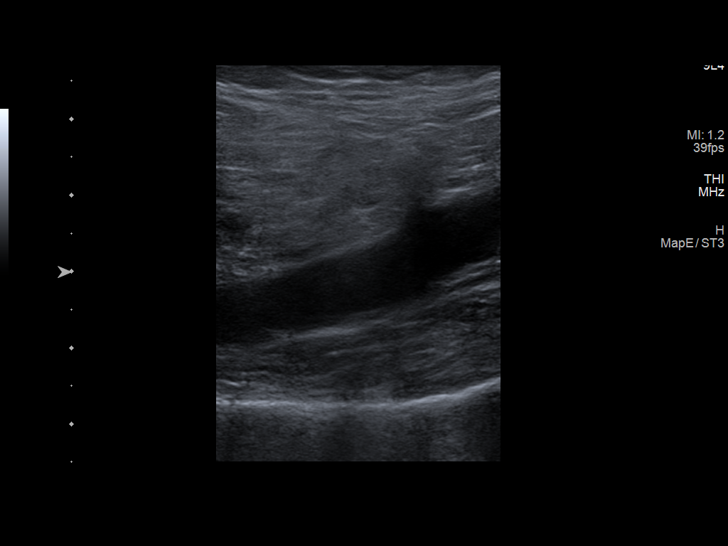
[im 41/50]
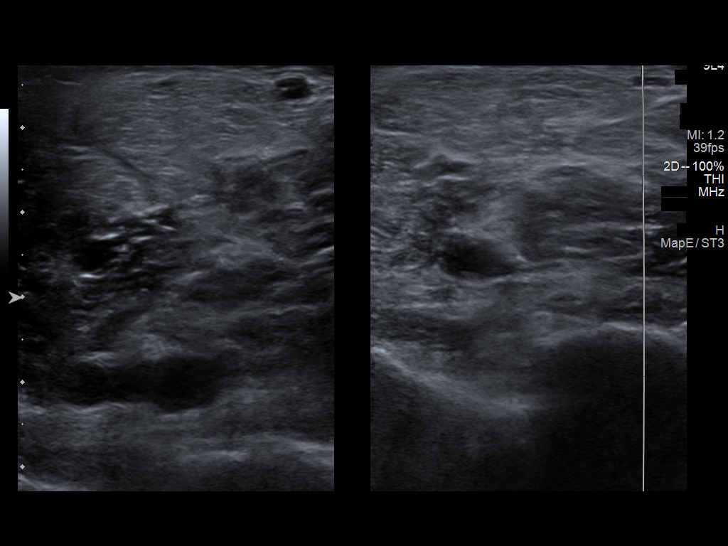
[im 45/50]
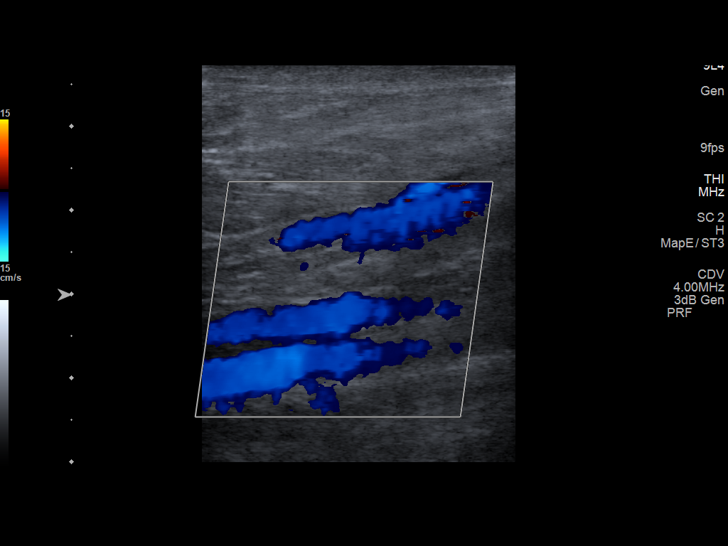
[im 50/50]
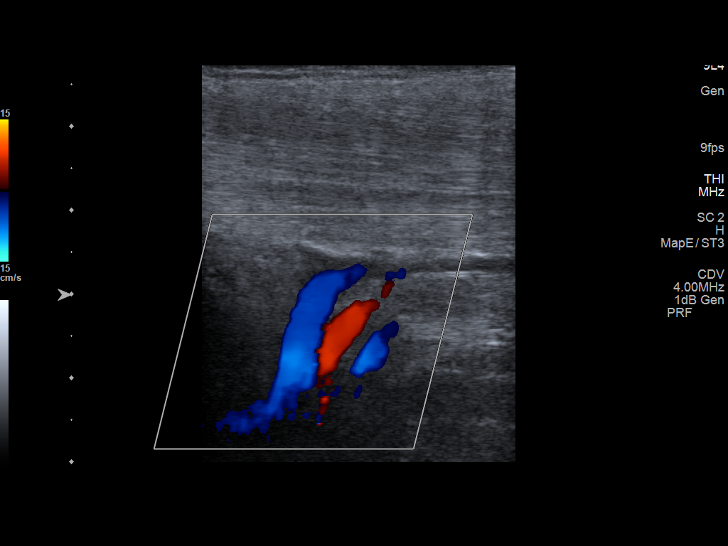

[13 of 24 positions shown; findings below may reference images not displayed]

FINDINGS: Contralateral Common Femoral Vein: Respiratory phasicity is normal
and symmetric with the symptomatic side. No evidence of thrombus.
Normal compressibility.

Common Femoral Vein: No evidence of thrombus. Normal
compressibility, respiratory phasicity and response to augmentation.

Saphenofemoral Junction: No evidence of thrombus. Normal
compressibility and flow on color Doppler imaging.

Profunda Femoral Vein: No evidence of thrombus. Normal
compressibility and flow on color Doppler imaging.

Femoral Vein: No evidence of thrombus. Normal compressibility,
respiratory phasicity and response to augmentation.

Popliteal Vein: No evidence of thrombus. Normal compressibility,
respiratory phasicity and response to augmentation.

Calf Veins: No evidence of thrombus. Normal compressibility and flow
on color Doppler imaging.

Superficial Great Saphenous Vein: No evidence of thrombus. Normal
compressibility.

Venous Reflux:  Not evaluated on this exam.

Other Findings:  None.
IMPRESSION: No evidence of deep venous thrombosis.

## 2020-06-18 ENCOUNTER — Other Ambulatory Visit: Payer: Self-pay | Admitting: Family Medicine

## 2020-06-18 DIAGNOSIS — R059 Cough, unspecified: Secondary | ICD-10-CM

## 2020-06-23 ENCOUNTER — Other Ambulatory Visit: Payer: Self-pay

## 2020-06-23 ENCOUNTER — Ambulatory Visit: Payer: Medicare HMO

## 2020-06-23 DIAGNOSIS — Z23 Encounter for immunization: Secondary | ICD-10-CM

## 2020-06-23 DIAGNOSIS — R600 Localized edema: Secondary | ICD-10-CM | POA: Diagnosis not present

## 2020-06-23 NOTE — Progress Notes (Signed)
   Covid-19 Vaccination Clinic  Name:  Dustin Cruz    MRN: [id number] DOB: [birth year]  06/23/2020  [name] was observed post Covid-19 immunization for 15 minutes without incident. He was provided with Vaccine Information Sheet and instruction to access the V-Safe system.   [name] was instructed to call 911 with any severe reactions post vaccine: [name] Difficulty breathing  . Swelling of face and throat  . A fast heartbeat  . A bad rash all over body  . Dizziness and weakness   Immunizations Administered    Name Date Dose VIS Date Route   Moderna COVID-19 Vaccine 06/23/2020 11:08 AM 0.5 mL 09/2019 Intramuscular   Manufacturer: Moderna   Lot: [id number]   [place]: [id number]

## 2020-06-24 ENCOUNTER — Telehealth: Payer: Self-pay | Admitting: Family Medicine

## 2020-06-24 NOTE — Telephone Encounter (Signed)
Called patient states his feet and legs are swollen apt made.

## 2020-06-25 ENCOUNTER — Other Ambulatory Visit: Payer: Self-pay

## 2020-06-25 ENCOUNTER — Encounter: Payer: Self-pay | Admitting: Family Medicine

## 2020-06-25 ENCOUNTER — Ambulatory Visit (INDEPENDENT_AMBULATORY_CARE_PROVIDER_SITE_OTHER): Payer: Medicare HMO | Admitting: Family Medicine

## 2020-06-25 VITALS — BP 127/81 | HR 85 | Temp 98.1°F | Resp 20 | Ht 74.0 in | Wt 196.0 lb

## 2020-06-25 DIAGNOSIS — R600 Localized edema: Secondary | ICD-10-CM

## 2020-06-25 DIAGNOSIS — I10 Essential (primary) hypertension: Secondary | ICD-10-CM

## 2020-06-25 MED ORDER — FUROSEMIDE 40 MG PO TABS: 40.0000 mg | ORAL_TABLET | Freq: Every day | ORAL | 5 refills | Status: DC

## 2020-06-25 NOTE — Progress Notes (Signed)
Subjective:  Patient ID: Dustin Cruz, male    DOB: 1965-12-08  Age: 54 y.o. MRN: [id number]  CC: No chief complaint on file.   HPI Dustin Cruz presents for swelling for over a year in both lower extremities.  He has a history of chronic DVT and is taking Eliquis for that.  The swelling has worsened over the last several days.  He went to urgent care couple days ago and got a 3-day supply of Lasix to tide him over until he could come here.  He has some baseline shortness of breath and somewhat increased as well from usual.  He has moderate pain from the tightness in his legs. Depression screen [facility] 2/9 06/25/2020 03/17/2020 10/29/2019  Decreased Interest 0 0 0  Down, Depressed, Hopeless 0 0 0  PHQ - 2 Score 0 0 0  Altered sleeping - - 0  Tired, decreased energy - - 0  Change in appetite - - 0  Feeling bad or failure about yourself  - - 0  Trouble concentrating - - 0  Moving slowly or fidgety/restless - - 0  Suicidal thoughts - - 0  PHQ-9 Score - - 0  Difficult doing work/chores - - -  Some recent data might be hidden    History Dustin Cruz has a past medical history of Anxiety, Asthma, CKD stage 3 due to type 2 diabetes mellitus ([place]), COPD (chronic obstructive pulmonary disease) ([place]), Coronary artery disease, Coronary artery disease, Depression, Diabetes mellitus, Gastroparesis (05/2013), GERD (gastroesophageal reflux disease), HTN (hypertension), Hyperlipidemia, Hyperlipidemia, Major depression, chronic (08/13/2012), Melanoma ([place]) (2007), Myocardial infarction ([place]) (2012), Obesity, Tobacco abuse, Tubular adenoma, and Urinary retention.   He has a past surgical history that includes melanoma surgery (2007); Cardiac catheterization; Esophagogastroduodenoscopy (04/24/2012); Colonoscopy (N/A, 01/27/2013); Coronary angioplasty with stent; left heart catheterization with coronary angiogram (N/A, 01/22/2012); Esophagogastroduodenoscopy (egd) with propofol (N/A, 01/07/2018); biopsy (01/07/2018); Flexible  sigmoidoscopy (N/A, 01/07/2018); Colonoscopy with propofol (N/A, 04/25/2018); and biopsy (04/25/2018).   His family history includes Alcohol abuse in his father; Heart disease in an other family member; Lung cancer in an other family member; Other in an other family member; Stroke in his mother.He reports that he has been smoking cigarettes. He started smoking about 38 years ago. He has a 31.00 [place]-year smoking history. He has never used smokeless tobacco. He reports that he does not drink alcohol and does not use drugs.    ROS Review of Systems  Constitutional: Negative for fever.  Respiratory: Positive for shortness of breath.   Cardiovascular: Positive for leg swelling. Negative for chest pain.  Musculoskeletal: Negative for arthralgias.  Skin: Negative for rash.    Objective:  BP 127/81   Pulse 85   Temp 98.1 F (36.7 C) (Temporal)   Resp 20   Ht '6\' 2"'  (1.88 m)   Wt 196 lb (88.9 kg)   SpO2 98%   BMI 25.16 kg/m   BP Readings from Last 3 Encounters:  06/25/20 127/81  05/30/20 137/90  03/17/20 127/75    Wt Readings from Last 3 Encounters:  06/25/20 196 lb (88.9 kg)  05/30/20 185 lb (83.9 kg)  03/17/20 191 lb 6.4 oz (86.8 kg)     Physical Exam Musculoskeletal:        General: Normal range of motion.     Right lower leg: Edema (3+) present.     Left lower leg: Edema (3+) present.       Assessment & Plan:   Diagnoses and all orders for  this visit:  Edema of both lower legs -     US Venous Img Lower Bilateral (DVT); Future -     CBC with Differential/Platelet -     CMP14+EGFR -     Brain natriuretic peptide  Essential hypertension, benign  Other orders -     furosemide (LASIX) 40 MG tablet; Take 1 tablet (40 mg total) by mouth daily. For swelling       I have discontinued Dustin Cruz's furosemide. I am also having him start on furosemide. Additionally, I am having him maintain his nitroGLYCERIN, acetaminophen, aspirin EC, apixaban, metFORMIN,  Accu-Chek Guide, Accu-Chek Guide, hydrOXYzine, triamcinolone cream, famotidine, traZODone, albuterol, Dulera, busPIRone, gabapentin, Lantus SoloStar, NovoLOG FlexPen, atorvastatin, lisinopril, tiZANidine, and albuterol.  Allergies as of 06/25/2020   No Known Allergies     Medication List       Accurate as of June 25, 2020  8:27 PM. If you have any questions, ask your nurse or doctor.        Accu-Chek Guide test strip Generic drug: glucose blood Use as instructed   Accu-Chek Guide w/Device Kit 1 Piece by Does not apply route as directed.   acetaminophen 325 MG tablet Commonly known as: TYLENOL Take 2 tablets (650 mg total) by mouth every 6 (six) hours as needed for mild pain, moderate pain or fever (or Fever >/= 101).   albuterol (2.5 MG/3ML) 0.083% nebulizer solution Commonly known as: PROVENTIL Take 3 mLs (2.5 mg total) by nebulization every 6 (six) hours as needed for wheezing or shortness of breath. Dx---44.1   albuterol 108 (90 Base) MCG/ACT inhaler Commonly known as: VENTOLIN HFA 2 PUFFS INTO THE LUNGS EVERY 6 HOURS AS NEEDED FOR WHEEZING.   apixaban 5 MG Tabs tablet Commonly known as: Eliquis Take 1 tablet (5 mg total) by mouth 2 (two) times daily.   aspirin EC 81 MG tablet Take 1 tablet (81 mg total) by mouth daily with breakfast.   atorvastatin 80 MG tablet Commonly known as: LIPITOR Take 1 tablet (80 mg total) by mouth daily.   busPIRone 7.5 MG tablet Commonly known as: BUSPAR TAKE (1) TABLET TWICE DAILY.   Dulera 100-5 MCG/ACT Aero Generic drug: mometasone-formoterol Inhale 2 puffs into the lungs in the morning and at bedtime.   famotidine 20 MG tablet Commonly known as: PEPCID Take 20 mg by mouth 2 (two) times daily.   furosemide 40 MG tablet Commonly known as: LASIX Take 1 tablet (40 mg total) by mouth daily. For swelling What changed:   medication strength  how much to take  additional instructions Changed by: Claretta Fraise, MD     gabapentin 600 MG tablet Commonly known as: NEURONTIN Take 1 tablet (600 mg total) by mouth 3 (three) times daily.   hydrOXYzine 25 MG capsule Commonly known as: Vistaril Take 1 capsule (25 mg total) by mouth 3 (three) times daily as needed.   Lantus SoloStar 100 UNIT/ML Solostar Pen Generic drug: insulin glargine Inject 60 Units into the skin every evening.   lisinopril 5 MG tablet Commonly known as: ZESTRIL TAKE 1 TABLET BY MOUTH AT BEDTIME.   metFORMIN 500 MG tablet Commonly known as: Glucophage Take 1 tablet (500 mg total) by mouth 2 (two) times daily with a meal.   nitroGLYCERIN 0.4 MG SL tablet Commonly known as: NITROSTAT Place 1 tablet (0.4 mg total) under the tongue every 5 (five) minutes as needed. For chest pain.   NovoLOG FlexPen 100 UNIT/ML FlexPen Generic drug: insulin aspart Inject  10 Units into the skin 3 (three) times daily before meals.   tiZANidine 4 MG tablet Commonly known as: ZANAFLEX TAKE 1/2 TO 1 TABLET BY MOUTH EVERY 8 HOURS AS NEEDED FOR MUSCLE SPASMS.   traZODone 150 MG tablet Commonly known as: DESYREL Take 1 tablet (150 mg total) by mouth at bedtime.   triamcinolone cream 0.1 % Commonly known as: KENALOG Apply 1 application topically 2 (two) times daily.        Follow-up: Return in about 2 weeks (around 07/09/2020).  Claretta Fraise, M.D.

## 2020-06-26 LAB — CMP14+EGFR
ALT: 33 IU/L (ref 0–44)
AST: 21 IU/L (ref 0–40)
Albumin/Globulin Ratio: 1.1 — ABNORMAL LOW (ref 1.2–2.2)
Albumin: 3.1 g/dL — ABNORMAL LOW (ref 3.8–4.9)
Alkaline Phosphatase: 149 IU/L — ABNORMAL HIGH (ref 44–121)
BUN/Creatinine Ratio: 10 (ref 9–20)
BUN: 12 mg/dL (ref 6–24)
Bilirubin Total: 0.6 mg/dL (ref 0.0–1.2)
CO2: 26 mmol/L (ref 20–29)
Calcium: 9.1 mg/dL (ref 8.7–10.2)
Chloride: 87 mmol/L — ABNORMAL LOW (ref 96–106)
Creatinine, Ser: 1.21 mg/dL (ref 0.76–1.27)
GFR calc Af Amer: 77 mL/min/{1.73_m2} (ref >59)
GFR calc non Af Amer: 67 mL/min/{1.73_m2} (ref >59)
Globulin, Total: 2.8 g/dL (ref 1.5–4.5)
Glucose: 455 mg/dL (ref 65–99)
Potassium: 4.9 mmol/L (ref 3.5–5.2)
Sodium: 131 mmol/L — ABNORMAL LOW (ref 134–144)
Total Protein: 5.9 g/dL — ABNORMAL LOW (ref 6.0–8.5)

## 2020-06-26 LAB — BRAIN NATRIURETIC PEPTIDE: BNP: 1263.8 pg/mL — ABNORMAL HIGH (ref 0.0–100.0)

## 2020-06-26 LAB — CBC WITH DIFFERENTIAL/PLATELET
Basophils Absolute: 0.1 10*3/uL (ref 0.0–0.2)
Basos: 1 %
EOS (ABSOLUTE): 0.1 10*3/uL (ref 0.0–0.4)
Eos: 1 %
Hematocrit: 46.2 % (ref 37.5–51.0)
Hemoglobin: 15.4 g/dL (ref 13.0–17.7)
Immature Grans (Abs): 0 10*3/uL (ref 0.0–0.1)
Immature Granulocytes: 0 %
Lymphocytes Absolute: 1.3 10*3/uL (ref 0.7–3.1)
Lymphs: 15 %
MCH: 31.4 pg (ref 26.6–33.0)
MCHC: 33.3 g/dL (ref 31.5–35.7)
MCV: 94 fL (ref 79–97)
Monocytes Absolute: 0.8 10*3/uL (ref 0.1–0.9)
Monocytes: 10 %
Neutrophils Absolute: 6.1 10*3/uL (ref 1.4–7.0)
Neutrophils: 73 %
Platelets: 384 10*3/uL (ref 150–450)
RBC: 4.9 x10E6/uL (ref 4.14–5.80)
RDW: 12.1 % (ref 11.6–15.4)
WBC: 8.5 10*3/uL (ref 3.4–10.8)

## 2020-06-28 ENCOUNTER — Telehealth: Payer: Self-pay | Admitting: Family Medicine

## 2020-06-28 ENCOUNTER — Other Ambulatory Visit: Payer: Self-pay | Admitting: Family Medicine

## 2020-06-28 ENCOUNTER — Other Ambulatory Visit: Payer: Self-pay

## 2020-06-28 DIAGNOSIS — J441 Chronic obstructive pulmonary disease with (acute) exacerbation: Secondary | ICD-10-CM

## 2020-06-28 DIAGNOSIS — J410 Simple chronic bronchitis: Secondary | ICD-10-CM

## 2020-06-28 DIAGNOSIS — R059 Cough, unspecified: Secondary | ICD-10-CM

## 2020-06-28 DIAGNOSIS — R739 Hyperglycemia, unspecified: Secondary | ICD-10-CM

## 2020-06-28 MED ORDER — ALBUTEROL SULFATE HFA 108 (90 BASE) MCG/ACT IN AERS: INHALATION_SPRAY | RESPIRATORY_TRACT | 3 refills | Status: DC

## 2020-06-28 MED ORDER — LISINOPRIL 10 MG PO TABS
10.0000 mg | ORAL_TABLET | Freq: Every day | ORAL | 1 refills | Status: DC
Start: 2020-06-28 — End: 2020-07-01

## 2020-06-28 NOTE — Telephone Encounter (Signed)
He has an albuterol inhaler just sent 9/10.  Does not need nebulizer to use puffer.

## 2020-06-28 NOTE — Telephone Encounter (Signed)
Pt says that he does not have nebulizer machine or hose. He just has the solution. Use [facility] in [place]

## 2020-06-29 ENCOUNTER — Other Ambulatory Visit: Payer: Self-pay

## 2020-06-29 ENCOUNTER — Other Ambulatory Visit: Payer: Medicare HMO

## 2020-06-29 ENCOUNTER — Emergency Department (HOSPITAL_COMMUNITY): Payer: Medicare HMO

## 2020-06-29 ENCOUNTER — Inpatient Hospital Stay (HOSPITAL_COMMUNITY)
Admission: EM | Admit: 2020-06-29 | Discharge: 2020-06-30 | DRG: 291 | Discharge status: Against Medical Advice | Payer: Medicare HMO | Attending: Internal Medicine | Admitting: Internal Medicine

## 2020-06-29 ENCOUNTER — Encounter (HOSPITAL_COMMUNITY): Payer: Self-pay | Admitting: Emergency Medicine

## 2020-06-29 DIAGNOSIS — J90 Pleural effusion, not elsewhere classified: Secondary | ICD-10-CM | POA: Diagnosis not present

## 2020-06-29 DIAGNOSIS — E871 Hypo-osmolality and hyponatremia: Secondary | ICD-10-CM | POA: Diagnosis present

## 2020-06-29 DIAGNOSIS — F17210 Nicotine dependence, cigarettes, uncomplicated: Secondary | ICD-10-CM | POA: Diagnosis present

## 2020-06-29 DIAGNOSIS — E785 Hyperlipidemia, unspecified: Secondary | ICD-10-CM | POA: Diagnosis present

## 2020-06-29 DIAGNOSIS — I110 Hypertensive heart disease with heart failure: Secondary | ICD-10-CM | POA: Diagnosis not present

## 2020-06-29 DIAGNOSIS — I2510 Atherosclerotic heart disease of native coronary artery without angina pectoris: Secondary | ICD-10-CM | POA: Diagnosis not present

## 2020-06-29 DIAGNOSIS — T82855A Stenosis of coronary artery stent, initial encounter: Secondary | ICD-10-CM | POA: Diagnosis present

## 2020-06-29 DIAGNOSIS — Z7901 Long term (current) use of anticoagulants: Secondary | ICD-10-CM

## 2020-06-29 DIAGNOSIS — E876 Hypokalemia: Secondary | ICD-10-CM | POA: Diagnosis present

## 2020-06-29 DIAGNOSIS — Y831 Surgical operation with implant of artificial internal device as the cause of abnormal reaction of the patient, or of later complication, without mention of misadventure at the time of the procedure: Secondary | ICD-10-CM | POA: Diagnosis present

## 2020-06-29 DIAGNOSIS — K219 Gastro-esophageal reflux disease without esophagitis: Secondary | ICD-10-CM | POA: Diagnosis not present

## 2020-06-29 DIAGNOSIS — I5033 Acute on chronic diastolic (congestive) heart failure: Secondary | ICD-10-CM | POA: Diagnosis present

## 2020-06-29 DIAGNOSIS — R739 Hyperglycemia, unspecified: Secondary | ICD-10-CM | POA: Diagnosis not present

## 2020-06-29 DIAGNOSIS — I10 Essential (primary) hypertension: Secondary | ICD-10-CM | POA: Diagnosis present

## 2020-06-29 DIAGNOSIS — Z20822 Contact with and (suspected) exposure to covid-19: Secondary | ICD-10-CM | POA: Diagnosis not present

## 2020-06-29 DIAGNOSIS — I499 Cardiac arrhythmia, unspecified: Secondary | ICD-10-CM | POA: Diagnosis not present

## 2020-06-29 DIAGNOSIS — Z7982 Long term (current) use of aspirin: Secondary | ICD-10-CM

## 2020-06-29 DIAGNOSIS — E1143 Type 2 diabetes mellitus with diabetic autonomic (poly)neuropathy: Secondary | ICD-10-CM | POA: Diagnosis present

## 2020-06-29 DIAGNOSIS — E1122 Type 2 diabetes mellitus with diabetic chronic kidney disease: Secondary | ICD-10-CM

## 2020-06-29 DIAGNOSIS — Z801 Family history of malignant neoplasm of trachea, bronchus and lung: Secondary | ICD-10-CM

## 2020-06-29 DIAGNOSIS — Z794 Long term (current) use of insulin: Secondary | ICD-10-CM

## 2020-06-29 DIAGNOSIS — Z86718 Personal history of other venous thrombosis and embolism: Secondary | ICD-10-CM | POA: Diagnosis not present

## 2020-06-29 DIAGNOSIS — R0602 Shortness of breath: Secondary | ICD-10-CM | POA: Diagnosis not present

## 2020-06-29 DIAGNOSIS — Z743 Need for continuous supervision: Secondary | ICD-10-CM | POA: Diagnosis not present

## 2020-06-29 DIAGNOSIS — N183 Chronic kidney disease, stage 3 unspecified: Secondary | ICD-10-CM | POA: Diagnosis present

## 2020-06-29 DIAGNOSIS — Z79899 Other long term (current) drug therapy: Secondary | ICD-10-CM

## 2020-06-29 DIAGNOSIS — Z823 Family history of stroke: Secondary | ICD-10-CM

## 2020-06-29 DIAGNOSIS — F419 Anxiety disorder, unspecified: Secondary | ICD-10-CM | POA: Diagnosis present

## 2020-06-29 DIAGNOSIS — E1121 Type 2 diabetes mellitus with diabetic nephropathy: Secondary | ICD-10-CM

## 2020-06-29 DIAGNOSIS — K3184 Gastroparesis: Secondary | ICD-10-CM | POA: Diagnosis present

## 2020-06-29 DIAGNOSIS — E1165 Type 2 diabetes mellitus with hyperglycemia: Secondary | ICD-10-CM | POA: Diagnosis present

## 2020-06-29 DIAGNOSIS — F32A Depression, unspecified: Secondary | ICD-10-CM | POA: Diagnosis present

## 2020-06-29 DIAGNOSIS — N1831 Chronic kidney disease, stage 3a: Secondary | ICD-10-CM | POA: Diagnosis present

## 2020-06-29 DIAGNOSIS — I509 Heart failure, unspecified: Secondary | ICD-10-CM

## 2020-06-29 DIAGNOSIS — R069 Unspecified abnormalities of breathing: Secondary | ICD-10-CM | POA: Diagnosis not present

## 2020-06-29 DIAGNOSIS — F329 Major depressive disorder, single episode, unspecified: Secondary | ICD-10-CM | POA: Diagnosis present

## 2020-06-29 DIAGNOSIS — Z85820 Personal history of malignant melanoma of skin: Secondary | ICD-10-CM

## 2020-06-29 DIAGNOSIS — I130 Hypertensive heart and chronic kidney disease with heart failure and stage 1 through stage 4 chronic kidney disease, or unspecified chronic kidney disease: Principal | ICD-10-CM | POA: Diagnosis present

## 2020-06-29 DIAGNOSIS — J449 Chronic obstructive pulmonary disease, unspecified: Secondary | ICD-10-CM | POA: Diagnosis not present

## 2020-06-29 DIAGNOSIS — I252 Old myocardial infarction: Secondary | ICD-10-CM | POA: Diagnosis not present

## 2020-06-29 DIAGNOSIS — Z7951 Long term (current) use of inhaled steroids: Secondary | ICD-10-CM

## 2020-06-29 DIAGNOSIS — R6889 Other general symptoms and signs: Secondary | ICD-10-CM | POA: Diagnosis not present

## 2020-06-29 LAB — CBC
HCT: 45.8 % (ref 39.0–52.0)
Hemoglobin: 15.4 g/dL (ref 13.0–17.0)
MCH: 32 pg (ref 26.0–34.0)
MCHC: 33.6 g/dL (ref 30.0–36.0)
MCV: 95 fL (ref 80.0–100.0)
Platelets: 420 10*3/uL — ABNORMAL HIGH (ref 150–400)
RBC: 4.82 MIL/uL (ref 4.22–5.81)
RDW: 13.1 % (ref 11.5–15.5)
WBC: 8.7 10*3/uL (ref 4.0–10.5)
nRBC: 0 % (ref 0.0–0.2)

## 2020-06-29 LAB — BASIC METABOLIC PANEL
Anion gap: 9 (ref 5–15)
BUN: 16 mg/dL (ref 6–20)
CO2: 27 mmol/L (ref 22–32)
Calcium: 8.5 mg/dL — ABNORMAL LOW (ref 8.9–10.3)
Chloride: 90 mmol/L — ABNORMAL LOW (ref 98–111)
Creatinine, Ser: 1.14 mg/dL (ref 0.61–1.24)
GFR calc Af Amer: >60 mL/min (ref >60)
GFR calc non Af Amer: >60 mL/min (ref >60)
Glucose, Bld: 587 mg/dL (ref 70–99)
Potassium: 3.9 mmol/L (ref 3.5–5.1)
Sodium: 126 mmol/L — ABNORMAL LOW (ref 135–145)

## 2020-06-29 LAB — TROPONIN I (HIGH SENSITIVITY)
Troponin I (High Sensitivity): 37 ng/L — ABNORMAL HIGH (ref <18)
Troponin I (High Sensitivity): 46 ng/L — ABNORMAL HIGH (ref <18)

## 2020-06-29 LAB — BRAIN NATRIURETIC PEPTIDE: B Natriuretic Peptide: 1602 pg/mL — ABNORMAL HIGH (ref 0.0–100.0)

## 2020-06-29 LAB — SARS CORONAVIRUS 2 BY RT PCR (HOSPITAL ORDER, PERFORMED IN ~~LOC~~ HOSPITAL LAB): SARS Coronavirus 2: NEGATIVE

## 2020-06-29 LAB — MAGNESIUM: Magnesium: 1.8 mg/dL (ref 1.7–2.4)

## 2020-06-29 LAB — BAYER DCA HB A1C WAIVED: HB A1C (BAYER DCA - WAIVED): >14 % — ABNORMAL HIGH (ref <7.0)

## 2020-06-29 MED ORDER — TRAZODONE HCL 50 MG PO TABS
150.0000 mg | ORAL_TABLET | Freq: Every day | ORAL | Status: DC
  Administered 2020-06-29: 150 mg via ORAL
  Filled 2020-06-29: qty 3

## 2020-06-29 MED ORDER — ALBUTEROL SULFATE HFA 108 (90 BASE) MCG/ACT IN AERS: 1.0000 | INHALATION_SPRAY | RESPIRATORY_TRACT | Status: DC | PRN

## 2020-06-29 MED ORDER — ACETAMINOPHEN 650 MG RE SUPP: 650.0000 mg | Freq: Four times a day (QID) | RECTAL | Status: DC | PRN

## 2020-06-29 MED ORDER — FUROSEMIDE 10 MG/ML IJ SOLN
40.0000 mg | Freq: Two times a day (BID) | INTRAMUSCULAR | Status: DC
  Administered 2020-06-29: 40 mg via INTRAVENOUS
  Filled 2020-06-29: qty 4

## 2020-06-29 MED ORDER — TIZANIDINE HCL 4 MG PO TABS: 2.0000 mg | ORAL_TABLET | Freq: Three times a day (TID) | ORAL | Status: DC | PRN

## 2020-06-29 MED ORDER — NITROGLYCERIN 0.4 MG SL SUBL: 0.4000 mg | SUBLINGUAL_TABLET | SUBLINGUAL | Status: DC | PRN

## 2020-06-29 MED ORDER — LISINOPRIL 10 MG PO TABS
10.0000 mg | ORAL_TABLET | Freq: Every day | ORAL | Status: DC
  Administered 2020-06-29: 10 mg via ORAL
  Filled 2020-06-29: qty 1

## 2020-06-29 MED ORDER — INSULIN ASPART 100 UNIT/ML ~~LOC~~ SOLN: 0.0000 [IU] | Freq: Three times a day (TID) | SUBCUTANEOUS | Status: DC

## 2020-06-29 MED ORDER — HYDROXYZINE HCL 25 MG PO TABS: 25.0000 mg | ORAL_TABLET | Freq: Three times a day (TID) | ORAL | Status: DC | PRN

## 2020-06-29 MED ORDER — FUROSEMIDE 10 MG/ML IJ SOLN
80.0000 mg | Freq: Once | INTRAMUSCULAR | Status: AC
  Administered 2020-06-29: 80 mg via INTRAVENOUS
  Filled 2020-06-29: qty 8

## 2020-06-29 MED ORDER — ATORVASTATIN CALCIUM 40 MG PO TABS
80.0000 mg | ORAL_TABLET | Freq: Every day | ORAL | Status: DC
  Administered 2020-06-30: 80 mg via ORAL
  Filled 2020-06-29: qty 2

## 2020-06-29 MED ORDER — FAMOTIDINE 20 MG PO TABS
20.0000 mg | ORAL_TABLET | Freq: Two times a day (BID) | ORAL | Status: DC
  Administered 2020-06-29 – 2020-06-30 (×2): 20 mg via ORAL
  Filled 2020-06-29 (×2): qty 1

## 2020-06-29 MED ORDER — ONDANSETRON HCL 4 MG/2ML IJ SOLN: 4.0000 mg | Freq: Four times a day (QID) | INTRAMUSCULAR | Status: DC | PRN

## 2020-06-29 MED ORDER — APIXABAN 5 MG PO TABS
5.0000 mg | ORAL_TABLET | Freq: Two times a day (BID) | ORAL | Status: DC
  Administered 2020-06-29 – 2020-06-30 (×2): 5 mg via ORAL
  Filled 2020-06-29 (×2): qty 1

## 2020-06-29 MED ORDER — INSULIN GLARGINE 100 UNIT/ML ~~LOC~~ SOLN
10.0000 [IU] | Freq: Every day | SUBCUTANEOUS | Status: DC
  Administered 2020-06-29: 10 [IU] via SUBCUTANEOUS
  Filled 2020-06-29 (×2): qty 0.1

## 2020-06-29 MED ORDER — ONDANSETRON HCL 4 MG PO TABS: 4.0000 mg | ORAL_TABLET | Freq: Four times a day (QID) | ORAL | Status: DC | PRN

## 2020-06-29 MED ORDER — POTASSIUM CHLORIDE CRYS ER 20 MEQ PO TBCR
40.0000 meq | EXTENDED_RELEASE_TABLET | Freq: Once | ORAL | Status: AC
  Administered 2020-06-29: 40 meq via ORAL
  Filled 2020-06-29: qty 2

## 2020-06-29 MED ORDER — MAGNESIUM SULFATE 2 GM/50ML IV SOLN
2.0000 g | Freq: Once | INTRAVENOUS | Status: AC
  Administered 2020-06-29: 2 g via INTRAVENOUS
  Filled 2020-06-29: qty 50

## 2020-06-29 MED ORDER — ACETAMINOPHEN 325 MG PO TABS: 650.0000 mg | ORAL_TABLET | Freq: Four times a day (QID) | ORAL | Status: DC | PRN

## 2020-06-29 MED ORDER — MOMETASONE FURO-FORMOTEROL FUM 100-5 MCG/ACT IN AERO
2.0000 | INHALATION_SPRAY | Freq: Two times a day (BID) | RESPIRATORY_TRACT | Status: DC
  Filled 2020-06-29: qty 8.8

## 2020-06-29 MED ORDER — GABAPENTIN 300 MG PO CAPS
600.0000 mg | ORAL_CAPSULE | Freq: Three times a day (TID) | ORAL | Status: DC
  Administered 2020-06-29: 600 mg via ORAL
  Filled 2020-06-29 (×2): qty 2

## 2020-06-29 MED ORDER — ASPIRIN EC 81 MG PO TBEC
81.0000 mg | DELAYED_RELEASE_TABLET | Freq: Every day | ORAL | Status: DC
  Administered 2020-06-30: 81 mg via ORAL
  Filled 2020-06-29: qty 1

## 2020-06-29 MED ORDER — BUSPIRONE HCL 5 MG PO TABS
7.5000 mg | ORAL_TABLET | Freq: Two times a day (BID) | ORAL | Status: DC
  Administered 2020-06-29 – 2020-06-30 (×2): 7.5 mg via ORAL
  Filled 2020-06-29 (×2): qty 2

## 2020-06-29 NOTE — H&P (Signed)
History and Physical    Dustin Cruz CNO:[id number] DOB: 11/07/1965 DOA: 06/29/2020  PCP: [name], DO   Patient coming from: home   Chief Complaint: dyspnea  HPI: Dustin Cruz is a 54 y.o. male with medical history significant of asthma, chronic kidney disease stage III, type 2 diabetes mellitus, COPD, coronary artery disease, dyslipidemia and hypertension. Patient presented with acute onset of dyspnea, this morning he felt shortness of breath that progressed throughout the course of the day to the point where he became symptomatic with minimal efforts.  Dyspnea now is moderate to severe in intensity, there is no improving factors, it is worse with exertion, it has been associated with PND, orthopnea, lower extremity edema, cough and wheezing.  Patient has been taking furosemide on a daily basis, he seems to be nonvery compliant with a salt restricted diet.  He can't recall any specific out of the ordinary event over the last 48 hours, that might have triggered his decompensation.  ED Course: He was found volume overloaded, received intravenous furosemide along with potassium and magnesium.  He was referred for further admission and evaluation.  Review of Systems:  1. General: No fevers, no chills, no weight gain or weight loss 2. ENT: No runny nose or sore throat, no hearing disturbances 3. Pulmonary:positive dyspnea, cough, and wheezing as mentioned in HPI. no  hemoptysis 4. Cardiovascular: No angina, or claudication, but positive lower extremity edema, pnd and orthopnea 5. Gastrointestinal: No nausea or vomiting, no diarrhea or constipation 6. Hematology: No easy bruisability or frequent infections 7. Urology: No dysuria, hematuria or increased urinary frequency 8. Dermatology: No rashes. 9. Neurology: No seizures or paresthesias 10. Musculoskeletal: No joint pain or deformities  Past Medical History:  Diagnosis Date  . Anxiety   . Asthma   . CKD stage 3 due to type 2  diabetes mellitus ([place])   . COPD (chronic obstructive pulmonary disease) ([place])   . Coronary artery disease    s/p BMS to [facility] 10/2010;  Cath 01/22/12 prox 30-40% LAD, LCx ramus w/ hazy 70-80% in-stent restenosis, EF 60% treated medically  . Coronary artery disease    s/p BMS to [facility] 10/2010;  Cath 01/22/12 prox 30-40% LAD, LCx ramus w/ hazy 70-80% in-stent restenosis, EF 60% treated medically   . Depression   . Diabetes mellitus    Type 2  . Gastroparesis 05/2013  . GERD (gastroesophageal reflux disease)   . HTN (hypertension)   . Hyperlipidemia   . Hyperlipidemia   . Major depression, chronic 08/13/2012  . Melanoma ([place]) 2007   surgery at [facility], Followed by [place]  . Myocardial infarction ([place]) 2012  . Obesity   . Tobacco abuse   . Tubular adenoma   . Urinary retention     Past Surgical History:  Procedure Laterality Date  . BIOPSY  01/07/2018   Procedure: BIOPSY;  Surgeon: Daneil Dolin, MD;  Location[facility];  Service: Endoscopy;;  duodenal and gastric biopsy  . BIOPSY  04/25/2018   Procedure: BIOPSY;  Surgeon: Daneil Dolin, MD;  Location: [facility];  Service: Endoscopy;;  ascending and descending and sigmoid biopsies  . CARDIAC CATHETERIZATION     with stent  . COLONOSCOPY N/A 01/27/2013   QHU:TMLYYT and colonic polyps. Tubular adenomas, poor bowel prep, one-year follow-up surveillance colonoscopy recommended  . COLONOSCOPY WITH PROPOFOL N/A 04/25/2018   Procedure: COLONOSCOPY WITH PROPOFOL;  Surgeon: Daneil Dolin, MD;  Location: [facility];  Service: Endoscopy;  Laterality: N/A;  11:00am - pt to be prepped INPT and labs to be done as well  . CORONARY ANGIOPLASTY WITH STENT PLACEMENT    . ESOPHAGOGASTRODUODENOSCOPY  04/24/2012   [place]-mild erosive reflux esophagitis,dilated w/72F Venia Minks, small HH, minimal chronic gastric/bulbar erosions(No H pylori)  . ESOPHAGOGASTRODUODENOSCOPY (EGD) WITH PROPOFOL N/A 01/07/2018   Dr. Gala Romney: Erythematous mucosa in the  stomach, retained gastric contents, incomplete exam.  Biopsied showed mild chronic gastritis but no H. pylori.  Duodenal biopsy was negative for celiac disease.  [name] FLEXIBLE SIGMOIDOSCOPY N/A 01/07/2018   Dr. Gala Romney: Incomplete colonoscopy due to inadequate bowel prep  . LEFT HEART CATHETERIZATION WITH CORONARY ANGIOGRAM N/A 01/22/2012   Procedure: LEFT HEART CATHETERIZATION WITH CORONARY ANGIOGRAM;  Surgeon: Jolaine Artist, MD;  Location: [facility] CATH LAB;  Service: Cardiovascular;  Laterality: N/A;  . melanoma surgery  2007   [place], removed lymph nodes under arm as well,Left abd     reports that he has been smoking cigarettes. He started smoking about 38 years ago. He has a 31.00 [place]-year smoking history. He has never used smokeless tobacco. He reports that he does not drink alcohol and does not use drugs.  No Known Allergies  Family History  Problem Relation Age of Onset  . Stroke Mother   . Alcohol abuse Father   . Heart disease Other   . Other Other        not real familiar with family history  . Lung cancer Other   . Colon cancer Neg Hx   . Colon polyps Neg Hx      Prior to Admission medications   Medication Sig Start Date End Date Taking? Authorizing Provider  acetaminophen (TYLENOL) 325 MG tablet Take 2 tablets (650 mg total) by mouth every 6 (six) hours as needed for mild pain, moderate pain or fever (or Fever >/= 101). 03/21/19   Roxan Hockey, MD  albuterol (PROVENTIL) (2.5 MG/3ML) 0.083% nebulizer solution Take 3 mLs (2.5 mg total) by nebulization every 6 (six) hours as needed for wheezing or shortness of breath. Dx---44.1 02/16/20   [name], DO  albuterol (VENTOLIN HFA) 108 (90 Base) MCG/ACT inhaler 2 puffs every 6 hours prn 06/28/20   Claretta Fraise, MD  apixaban (ELIQUIS) 5 MG TABS tablet Take 1 tablet (5 mg total) by mouth 2 (two) times daily. 07/16/19   Strader, Fransisco Hertz, PA-C  aspirin EC 81 MG tablet Take 1 tablet (81 mg total) by mouth daily with breakfast.  03/21/19   Roxan Hockey, MD  atorvastatin (LIPITOR) 80 MG tablet Take 1 tablet (80 mg total) by mouth daily. 03/30/20   [name], DO  Blood Glucose Monitoring Suppl (ACCU-CHEK GUIDE) w/Device KIT 1 Piece by Does not apply route as directed. 11/12/19   Cassandria Anger, MD  busPIRone (BUSPAR) 7.5 MG tablet TAKE (1) TABLET TWICE DAILY. 03/17/20   Ronnie Doss M, DO  famotidine (PEPCID) 20 MG tablet Take 20 mg by mouth 2 (two) times daily. 12/22/19   [provider]  furosemide (LASIX) 40 MG tablet Take 1 tablet (40 mg total) by mouth daily. For swelling 06/25/20   Claretta Fraise, MD  gabapentin (NEURONTIN) 600 MG tablet Take 1 tablet (600 mg total) by mouth 3 (three) times daily. 03/17/20   Ronnie Doss M, DO  glucose blood (ACCU-CHEK GUIDE) test strip Use as instructed 11/12/19   Cassandria Anger, MD  hydrOXYzine (VISTARIL) 25 MG capsule Take 1 capsule (25 mg total) by mouth 3 (three) times  daily as needed. 12/18/19   Baruch Gouty, FNP  insulin glargine (LANTUS SOLOSTAR) 100 UNIT/ML Solostar Pen Inject 60 Units into the skin every evening. 03/18/20   [name], DO  lisinopril (ZESTRIL) 10 MG tablet Take 1 tablet (10 mg total) by mouth at bedtime. 06/28/20   Claretta Fraise, MD  metFORMIN (GLUCOPHAGE) 500 MG tablet Take 1 tablet (500 mg total) by mouth 2 (two) times daily with a meal. 11/12/19   Nida, Marella Chimes, MD  mometasone-formoterol (DULERA) 100-5 MCG/ACT AERO Inhale 2 puffs into the lungs in the morning and at bedtime. 03/17/20   [name], DO  nitroGLYCERIN (NITROSTAT) 0.4 MG SL tablet Place 1 tablet (0.4 mg total) under the tongue every 5 (five) minutes as needed. For chest pain. 08/13/12   Reece Packer, NP  NOVOLOG FLEXPEN 100 UNIT/ML FlexPen Inject 10 Units into the skin 3 (three) times daily before meals. 03/29/20   Ronnie Doss M, DO  tiZANidine (ZANAFLEX) 4 MG tablet TAKE 1/2 TO 1 TABLET BY MOUTH EVERY 8 HOURS AS NEEDED FOR MUSCLE  SPASMS. 06/10/20   Ronnie Doss M, DO  traZODone (DESYREL) 150 MG tablet Take 1 tablet (150 mg total) by mouth at bedtime. 02/03/20   [name], DO  triamcinolone cream (KENALOG) 0.1 % Apply 1 application topically 2 (two) times daily. 12/18/19   Baruch Gouty, FNP  metoprolol tartrate (LOPRESSOR) 25 MG tablet Take 25 mg by mouth 2 (two) times daily.    12/27/11  [provider]    Physical Exam: Vitals:   06/29/20 1856 06/29/20 1857 06/29/20 1930  BP: 129/83  (!) 154/105  Pulse: 88  85  Resp: 18  18  Temp: 97.7 F (36.5 C)    TempSrc: Oral    SpO2: 98% 98% 98%  Weight: 86.2 kg    Height: '6\' 2"'  (1.88 m)      Vitals:   06/29/20 1856 06/29/20 1857 06/29/20 1930  BP: 129/83  (!) 154/105  Pulse: 88  85  Resp: 18  18  Temp: 97.7 F (36.5 C)    TempSrc: Oral    SpO2: 98% 98% 98%  Weight: 86.2 kg    Height: '6\' 2"'  (1.88 m)     General: deconditioned and ill looking appearing  Neurology: Awake and alert, non focal Head and Neck. Head normocephalic. Neck supple with no adenopathy or thyromegaly.   E ENT: mild pallor, no icterus, oral mucosa moist Cardiovascular: positive JVD. S1-S2 present, rhythmic, no gallops, rubs, or murmurs. +++ pitting bilateral lower extremity edema. Pulmonary: positive breath sounds bilaterally, bilateral diffuse rales, predominantly at the lower 2/3 no wheezing or, rhonchi.  Gastrointestinal. Abdomen distended but soft and non tender Skin. No rashes Musculoskeletal: no joint deformities    Labs on Admission: I have personally reviewed following labs and imaging studies  CBC: Recent Labs  Lab 06/25/20 1454 06/29/20 1934  WBC 8.5 8.7  NEUTROABS 6.1  --   HGB 15.4 15.4  HCT 46.2 45.8  MCV 94 95.0  PLT 384 837*   Basic Metabolic Panel: Recent Labs  Lab 06/25/20 1454 06/29/20 1934  NA 131* 126*  K 4.9 3.9  CL 87* 90*  CO2 26 27  GLUCOSE 455* 587*  BUN 12 16  CREATININE 1.21 1.14  CALCIUM 9.1 8.5*  MG  --  1.8    GFR: Estimated Creatinine Clearance: 85.1 mL/min (by C-G formula based on SCr of 1.14 mg/dL). Liver Function Tests: Recent Labs  Lab 06/25/20  1454  AST 21  ALT 33  ALKPHOS 149*  BILITOT 0.6  PROT 5.9*  ALBUMIN 3.1*   No results for input(s): LIPASE, AMYLASE in the last 168 hours. No results for input(s): AMMONIA in the last 168 hours. Coagulation Profile: No results for input(s): INR, PROTIME in the last 168 hours. Cardiac Enzymes: No results for input(s): CKTOTAL, CKMB, CKMBINDEX, TROPONINI in the last 168 hours. BNP (last 3 results) No results for input(s): PROBNP in the last 8760 hours. HbA1C: Recent Labs    06/29/20 1244  HGBA1C >14.0*   CBG: No results for input(s): GLUCAP in the last 168 hours. Lipid Profile: No results for input(s): CHOL, HDL, LDLCALC, TRIG, CHOLHDL, LDLDIRECT in the last 72 hours. Thyroid Function Tests: No results for input(s): TSH, T4TOTAL, FREET4, T3FREE, THYROIDAB in the last 72 hours. Anemia Panel: No results for input(s): VITAMINB12, FOLATE, FERRITIN, TIBC, IRON, RETICCTPCT in the last 72 hours. Urine analysis:    Component Value Date/Time   COLORURINE YELLOW 08/06/2018 1555   APPEARANCEUR Cloudy (A) 06/10/2019 1549   LABSPEC 1.022 08/06/2018 1555   PHURINE 5.0 08/06/2018 1555   GLUCOSEU 3+ (A) 06/10/2019 1549   HGBUR SMALL (A) 08/06/2018 1555   BILIRUBINUR Negative 06/10/2019 1549   KETONESUR NEGATIVE 08/06/2018 1555   PROTEINUR 2+ (A) 06/10/2019 1549   PROTEINUR 100 (A) 08/06/2018 1555   UROBILINOGEN 0.2 01/26/2014 1618   NITRITE Negative 06/10/2019 1549   NITRITE NEGATIVE 08/06/2018 1555   LEUKOCYTESUR Trace (A) 06/10/2019 1549    Radiological Exams on Admission: DG Chest 2 View  Result Date: 06/29/2020 CLINICAL DATA:  Shortness of breath EXAM: CHEST - 2 VIEW COMPARISON:  12/27/2019 FINDINGS: Frontal and lateral views of the chest demonstrate a stable cardiac silhouette. There are small bilateral pleural effusions.  Increased central vascular congestion, with diffuse interstitial prominence and Kerley B-lines consistent with interstitial edema. No pneumothorax. IMPRESSION: 1. Findings consistent with interstitial edema and mild fluid overload. Electronically Signed   By: Randa Ngo M.D.   On: 06/29/2020 19:27    EKG: Independently reviewed.  90 bpm, normal axis, left bundle branch block, sinus rhythm with frequent PVCs, no significant ST segment or T wave changes.  Assessment/Plan Principal Problem:   CHF (congestive heart failure) (HCC) Active Problems:   Arteriosclerotic cardiovascular disease (ASCVD)   Type 2 diabetes mellitus with stage 3a chronic kidney disease, with long-term current use of insulin (HCC)   GERD (gastroesophageal reflux disease)   Depression   CKD stage 3 due to type 2 diabetes mellitus ([place])   Essential hypertension, benign    54 year old male with past medical history for ischemic heart disease, type II [place] mellitus and chronic kidney disease who presents with cute onset of rapidly worsening dyspnea, associated with lower extremity edema, PND and orthopnea.  On his initial physical examination temperature 97.4, blood pressure 147/98, heart rate 69, respiratory rate 18, oxygen saturation 96% on room air.  He had positive JVD, bilateral rails on lung examination, heart S1-S2, present, rhythmic, abdomen distended but nontender, positive bilateral lower extremity pitting edema. Sodium 126, potassium 3.9, chloride 90, bicarb 27, glucose 587, 916, creatinine 1.14, BNP 1602, troponin I 37, white count 8.7, hemoglobin 15.4, hematocrit 45.8, platelets 420.  SARS COVID-19 negative.  His chest radiograph had bilateral interstitial infiltrates, cephalization of the vasculature, and small left pleural effusion.  Patient will be admitted to the hospital with a working diagnosis of decompensated heart failure.  1.  Acute decompensation of heart failure/acute cardiogenic pulmonary edema.  Patient will be admitted to the telemetry ward, continue aggressive diuresis with intravenous furosemide, 40 mg twice daily.  Strict ins and outs and daily weights. Further work-up with echocardiography.  Close monitoring of blood pressure.  2.  COPD.  No signs of acute exacerbation, continue albuterol and Dulera.  3.  Type 2 diabetes mellitus/dyslipidemia.  We will resume long-acting insulin, start with 10 units of glargine and insulin sliding scale.  Hold on Metformin for now. Continue statin therapy. 4 diabetic neuropathy continue gabapentin.  3.  Hypertension.  Continue blood pressure control with lisinopril.  Patient likely will need  beta-blockade after compensation of his heart failure.  4.  Depression/anxiety.  Continue trazodone, tizanidine, hydroxyzine, buspirone.  5.  Chronic kidney disease stage II/III a.  Hypokalemia.  Continue aggressive diuresis, patient has received 40 mEq of potassium chloride in the emergency department.  6. History of chronic DVT. Continue anticoagulation with apixaban.   Status is: Inpatient  Remains inpatient appropriate because:IV treatments appropriate due to intensity of illness or inability to take PO   Dispo: The patient is from: Home              Anticipated d/c is to: Home              Anticipated d/c date is: 2 days              Patient currently is not medically stable to d/c.    DVT prophylaxis: apixaban   Code Status:   full  Family Communication:  No family at the bedside     Consults called:   none   Admission status:  Inpatient.    Jocelyne Reinertsen Gerome Apley MD [facility]   06/29/2020, 9:00 PM

## 2020-06-29 NOTE — ED Provider Notes (Signed)
[facility] EMERGENCY DEPARTMENT Provider Note   CSN: [id number] Arrival date & time: 06/29/20  1850     History Chief Complaint  Patient presents with  . Shortness of Breath    Dustin Cruz is a 54 y.o. male.  He has a history of COPD and has cardiac disease.  He is also on diuretics.  Unclear if he is got heart failure.  Complaining of shortness of breath dyspnea on exertion today associated with some chest pressure.  He is also noticed increased peripheral edema.  Nonproductive cough.  Continues to smoke.  Denies any fevers or chills.  Has had his Covid vaccination.  The history is provided by the patient.  Shortness of Breath Severity:  Moderate Onset quality:  Gradual Duration:  1 day Timing:  Constant Progression:  Unchanged Chronicity:  New Context: activity   Relieved by:  Nothing Worsened by:  Activity Ineffective treatments:  Inhaler Associated symptoms: chest pain   Associated symptoms: no abdominal pain, no cough, no fever, no headaches, no neck pain, no rash, no sore throat, no sputum production and no syncope   Risk factors: tobacco use        Past Medical History:  Diagnosis Date  . Anxiety   . Asthma   . CKD stage 3 due to type 2 diabetes mellitus ([place])   . COPD (chronic obstructive pulmonary disease) ([place])   . Coronary artery disease    s/p BMS to [place] 10/2010;  Cath 01/22/12 prox 30-40% LAD, LCx ramus w/ hazy 70-80% in-stent restenosis, EF 60% treated medically  . Coronary artery disease    s/p BMS to [place] 10/2010;  Cath 01/22/12 prox 30-40% LAD, LCx ramus w/ hazy 70-80% in-stent restenosis, EF 60% treated medically   . Depression   . Diabetes mellitus    Type 2  . Gastroparesis 05/2013  . GERD (gastroesophageal reflux disease)   . HTN (hypertension)   . Hyperlipidemia   . Hyperlipidemia   . Major depression, chronic 08/13/2012  . Melanoma ([place]) 2007   surgery at [facility], Followed by [place]  . Myocardial infarction ([place]) 2012  . Obesity   .  Tobacco abuse   . Tubular adenoma   . Urinary retention     Patient Active Problem List   Diagnosis Date Noted  . Pre-ulcerative calluses 10/29/2019  . Musculoskeletal pain 10/13/2019  . Housing problems 05/23/2019  . Essential hypertension, benign 03/20/2019  . 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 Near syncope 03/17/2019  . AKI (acute kidney injury) ([place]) 03/17/2019  . 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 CKD stage 3 due to type 2 diabetes mellitus ([place]) 11/28/2018  . Microalbuminuria due to type 2 diabetes mellitus ([place]) 05/22/2018  . Hyperglycemia 04/24/2018  . Abdominal pain, epigastric 11/29/2017  . Diarrhea 11/29/2017  . History of colonic polyps 11/29/2017  . Diffuse abdominal pain 11/29/2017  . Diabetic retinopathy ([place]) 09/21/2015  . Carotid bruit 09/21/2015  . Viral gastroenteritis 06/03/2015  . Hypertriglyceridemia 05/18/2015  . Vitamin D deficiency 12/29/2013  . Nausea alone 04/24/2013  . Frequency of urination 04/14/2013  . Fever, unspecified 04/14/2013  . Hepatic steatosis 12/28/2012  . Peripheral neuropathy 11/25/2012  . Melanoma of skin, site unspecified 11/25/2012  . Mixed hyperlipidemia   . Depression   . Hypertension associated with diabetes ([place])   . GERD (gastroesophageal reflux disease) 04/03/2012  . Current smoker 03/14/2012  . Type 2 diabetes mellitus with stage 3a chronic kidney disease, with long-term current use of insulin ([place])   .  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 BIOPSY  01/07/2018   Procedure: BIOPSY;  Surgeon: Daneil Dolin, MD;  Location: [facility];  Service: Endoscopy;;  duodenal and gastric biopsy  . BIOPSY  04/25/2018   Procedure: BIOPSY;  Surgeon: Daneil Dolin, MD;  Location: [facility];  Service: Endoscopy;;  ascending and descending and sigmoid biopsies  . CARDIAC CATHETERIZATION     with stent  . COLONOSCOPY N/A 01/27/2013   KKX:FGHWEX and colonic  polyps. Tubular adenomas, poor bowel prep, one-year follow-up surveillance colonoscopy recommended  . COLONOSCOPY WITH PROPOFOL N/A 04/25/2018   Procedure: COLONOSCOPY WITH PROPOFOL;  Surgeon: Daneil Dolin, MD;  Location: [facility];  Service: Endoscopy;  Laterality: N/A;  11:00am - pt to be prepped INPT and labs to be done as well  . CORONARY ANGIOPLASTY WITH STENT PLACEMENT    . ESOPHAGOGASTRODUODENOSCOPY  04/24/2012   [place]-mild erosive reflux esophagitis,dilated w/66F Venia Minks, small HH, minimal chronic gastric/bulbar erosions(No H pylori)  . ESOPHAGOGASTRODUODENOSCOPY (EGD) WITH PROPOFOL N/A 01/07/2018   Dr. Gala Romney: Erythematous mucosa in the stomach, retained gastric contents, incomplete exam.  Biopsied showed mild chronic gastritis but no H. pylori.  Duodenal biopsy was negative for celiac disease.  [name] FLEXIBLE SIGMOIDOSCOPY N/A 01/07/2018   Dr. Gala Romney: Incomplete colonoscopy due to inadequate bowel prep  . LEFT HEART CATHETERIZATION WITH CORONARY ANGIOGRAM N/A 01/22/2012   Procedure: LEFT HEART CATHETERIZATION WITH CORONARY ANGIOGRAM;  Surgeon: Jolaine Artist, MD;  Location: [facility] CATH LAB;  Service: Cardiovascular;  Laterality: N/A;  . melanoma surgery  2007   [place], removed lymph nodes under arm as well,Left abd       Family History  Problem Relation Age of Onset  . Stroke Mother   . Alcohol abuse Father   . Heart disease Other   . Other Other        not real familiar with family history  . Lung cancer Other   . Colon cancer Neg Hx   . Colon polyps Neg Hx     Social History   Tobacco Use  . Smoking status: Current Every Day Smoker    Packs/day: 1.00    Years: 31.00    [place] years: 31.00    Types: Cigarettes    Start date: 10/09/1981  . Smokeless tobacco: Never Used  . Tobacco comment: 1/2 [place] daily 11/01/17  Vaping Use  . Vaping Use: Never used  Substance Use Topics  . 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  . Drug use: No    Home Medications Prior to Admission medications   Medication Sig Start Date End Date Taking? Authorizing Provider  acetaminophen (TYLENOL) 325 MG tablet Take 2 tablets (650 mg total) by mouth every 6 (six) hours as needed for mild pain, moderate pain or fever (or Fever >/= 101). 03/21/19   Roxan Hockey, MD  albuterol (PROVENTIL) (2.5 MG/3ML) 0.083% nebulizer solution Take 3 mLs (2.5 mg total) by nebulization every 6 (six) hours as needed for wheezing or shortness of breath. Dx---44.1 02/16/20   [name], DO  albuterol (VENTOLIN HFA) 108 (90 Base) MCG/ACT inhaler 2 puffs every 6 hours prn 06/28/20   Claretta Fraise, MD  apixaban (ELIQUIS) 5 MG TABS tablet Take 1 tablet (5 mg total) by mouth 2 (two) times  daily. 07/16/19   Strader, Fransisco Hertz, PA-C  aspirin EC 81 MG tablet Take 1 tablet (81 mg total) by mouth daily with breakfast. 03/21/19   Roxan Hockey, MD  atorvastatin (LIPITOR) 80 MG tablet Take 1 tablet (80 mg total) by mouth daily. 03/30/20   [name], DO  Blood Glucose Monitoring Suppl (ACCU-CHEK GUIDE) w/Device KIT 1 Piece by Does not apply route as directed. 11/12/19   Cassandria Anger, MD  busPIRone (BUSPAR) 7.5 MG tablet TAKE (1) TABLET TWICE DAILY. 03/17/20   Ronnie Doss M, DO  famotidine (PEPCID) 20 MG tablet Take 20 mg by mouth 2 (two) times daily. 12/22/19   [provider]  furosemide (LASIX) 40 MG tablet Take 1 tablet (40 mg total) by mouth daily. For swelling 06/25/20   Claretta Fraise, MD  gabapentin (NEURONTIN) 600 MG tablet Take 1 tablet (600 mg total) by mouth 3 (three) times daily. 03/17/20   Ronnie Doss M, DO  glucose blood (ACCU-CHEK GUIDE) test strip Use as instructed 11/12/19   Cassandria Anger, MD  hydrOXYzine (VISTARIL) 25 MG capsule Take 1 capsule (25 mg total) by mouth 3 (three) times daily as needed. 12/18/19   Baruch Gouty, FNP    insulin glargine (LANTUS SOLOSTAR) 100 UNIT/ML Solostar Pen Inject 60 Units into the skin every evening. 03/18/20   [name], DO  lisinopril (ZESTRIL) 10 MG tablet Take 1 tablet (10 mg total) by mouth at bedtime. 06/28/20   Claretta Fraise, MD  metFORMIN (GLUCOPHAGE) 500 MG tablet Take 1 tablet (500 mg total) by mouth 2 (two) times daily with a meal. 11/12/19   Nida, Marella Chimes, MD  mometasone-formoterol (DULERA) 100-5 MCG/ACT AERO Inhale 2 puffs into the lungs in the morning and at bedtime. 03/17/20   [name], DO  nitroGLYCERIN (NITROSTAT) 0.4 MG SL tablet Place 1 tablet (0.4 mg total) under the tongue every 5 (five) minutes as needed. For chest pain. 08/13/12   Reece Packer, NP  NOVOLOG FLEXPEN 100 UNIT/ML FlexPen Inject 10 Units into the skin 3 (three) times daily before meals. 03/29/20   Ronnie Doss M, DO  tiZANidine (ZANAFLEX) 4 MG tablet TAKE 1/2 TO 1 TABLET BY MOUTH EVERY 8 HOURS AS NEEDED FOR MUSCLE SPASMS. 06/10/20   Ronnie Doss M, DO  traZODone (DESYREL) 150 MG tablet Take 1 tablet (150 mg total) by mouth at bedtime. 02/03/20   [name], DO  triamcinolone cream (KENALOG) 0.1 % Apply 1 application topically 2 (two) times daily. 12/18/19   Baruch Gouty, FNP  metoprolol tartrate (LOPRESSOR) 25 MG tablet Take 25 mg by mouth 2 (two) times daily.    12/27/11  [provider]    Allergies    Patient has no known allergies.  Review of Systems   Review of Systems  Constitutional: Negative for fever.  HENT: Negative for sore throat.   Eyes: Negative for visual disturbance.  Respiratory: Positive for shortness of breath. Negative for cough and sputum production.   Cardiovascular: Positive for chest pain. Negative for syncope.  Gastrointestinal: Negative for abdominal pain.  Genitourinary: Negative for dysuria.  Musculoskeletal: Negative for neck pain.  Skin: Negative for rash.  Neurological: Negative for headaches.    Physical  Exam Updated Vital Signs BP 129/83 (BP Location: Right Arm)   Pulse 88   Temp 97.7 F (36.5 C) (Oral)   Resp 18   Ht _0  (1.88 m)   Wt 86.2 kg   SpO2 98%  BMI 24.39 kg/m   Physical Exam Vitals and nursing note reviewed.  Constitutional:      Appearance: He is well-developed.  HENT:     Head: Normocephalic and atraumatic.  Eyes:     Conjunctiva/sclera: Conjunctivae normal.  Cardiovascular:     Rate and Rhythm: Normal rate and regular rhythm.     Heart sounds: No murmur heard.   Pulmonary:     Effort: Accessory muscle usage present. No respiratory distress.     Breath sounds: Normal breath sounds.  Abdominal:     Palpations: Abdomen is soft.     Tenderness: There is no abdominal tenderness.  Musculoskeletal:     Cervical back: Neck supple.     Right lower leg: No tenderness. Edema present.     Left lower leg: No tenderness. Edema present.  Skin:    General: Skin is warm and dry.     Capillary Refill: Capillary refill takes less than 2 seconds.  Neurological:     General: No focal deficit present.     Mental Status: He is alert.     ED Results / Procedures / Treatments   Labs (all labs ordered are listed, but only abnormal results are displayed) Labs Reviewed  BASIC METABOLIC PANEL - Abnormal; Notable for the following components:      Result Value   Sodium 126 (*)    Chloride 90 (*)    Glucose, Bld 587 (*)    Calcium 8.5 (*)    All other components within normal limits  CBC - Abnormal; Notable for the following components:   Platelets 420 (*)    All other components within normal limits  BRAIN NATRIURETIC PEPTIDE - Abnormal; Notable for the following components:   B Natriuretic Peptide 1,602.0 (*)    All other components within normal limits  BASIC METABOLIC PANEL - Abnormal; Notable for the following components:   Sodium 132 (*)    Chloride 91 (*)    CO2 33 (*)    Glucose, Bld 479 (*)    Calcium 8.6 (*)    All other components within normal limits   TROPONIN I (HIGH SENSITIVITY) - Abnormal; Notable for the following components:   Troponin I (High Sensitivity) 37 (*)    All other components within normal limits  TROPONIN I (HIGH SENSITIVITY) - Abnormal; Notable for the following components:   Troponin I (High Sensitivity) 46 (*)    All other components within normal limits  SARS CORONAVIRUS 2 BY RT PCR (HOSPITAL ORDER, [facility])  MAGNESIUM  CBC  HIV ANTIBODY (ROUTINE TESTING W REFLEX)  HEMOGLOBIN A1C    EKG EKG Interpretation  Date/Time:  Tuesday June 29 2020 19:01:29 EDT Ventricular Rate:  90 PR Interval:  260 QRS Duration: 144 QT Interval:  398 QTC Calculation: 486 R Axis:   94 Text Interpretation: Sinus rhythm with 1st degree A-V block with frequent Premature ventricular complexes Rightward axis Non-specific intra-ventricular conduction block Cannot rule out Anteroseptal infarct , age undetermined T wave abnormality, consider inferolateral ischemia Abnormal ECG increased ectopy compared with prior 3/21 Confirmed by Aletta Edouard [phone]) on 06/29/2020 7:09:58 PM   Radiology DG Chest 2 View  Result Date: 06/29/2020 CLINICAL DATA:  Shortness of breath EXAM: CHEST - 2 VIEW COMPARISON:  12/27/2019 FINDINGS: Frontal and lateral views of the chest demonstrate a stable cardiac silhouette. There are small bilateral pleural effusions. Increased central vascular congestion, with diffuse interstitial prominence and Kerley B-lines consistent with interstitial edema. No pneumothorax.  IMPRESSION: 1. Findings consistent with interstitial edema and mild fluid overload. Electronically Signed   By: Randa Ngo M.D.   On: 06/29/2020 19:27    Procedures Procedures (including critical care time)  Medications Ordered in ED Medications  furosemide (LASIX) injection 80 mg (has no administration in time range)    ED Course  I have reviewed the triage vital signs and the nursing notes.  Pertinent labs &  imaging results that were available during my care of the patient were reviewed by me and considered in my medical decision making (see chart for details).  Clinical Course as of Jun 30 1445  Tue Jun 29, 2020  2012 Last echo done in 2020 with normal EF.   [MB]    Clinical Course User Index [MB] Hayden Rasmussen, MD   MDM Rules/Calculators/A&P                         Landers Prajapati Pfeffer was evaluated in Emergency Department on 06/29/2020 for the symptoms described in the history of present illness. He was evaluated in the context of the global COVID-19 pandemic, which necessitated consideration that the patient might be at risk for infection with the SARS-CoV-2 virus that causes COVID-19. Institutional protocols and algorithms that pertain to the evaluation of patients at risk for COVID-19 are in a state of rapid change based on information released by regulatory bodies including the CDC and federal and state organizations. These policies and algorithms were followed during the patient's care in the ED.  This patient complains of dyspnea on exertion, peripheral edema; this involves an extensive number of treatment Options and is a complaint that carries with it a high risk of complications and Morbidity. The differential includes CHF, COPD, ACS, valvular disorder, anemia, Covid  I ordered, reviewed and interpreted labs, which included CBC with normal white count normal hemoglobin's, chemistries fairly normal other than elevated glucose, troponin elevated and the need to be trended, BNP markedly elevated, Covid testing negative I ordered medication IV Lasix for diuresis I ordered imaging studies which included chest x-ray and I independently    visualized and interpreted imaging which showed signs of fluid overload Previous records obtained and reviewed in epic, I do not see that he has had a prior diagnosis of CHF. I consulted [facility] hospitalist Dr. Josph Macho and discussed lab and imaging  findings  Critical Interventions: None  After the interventions stated above, I reevaluated the patient and found patient to be improved but still symptomatic. I recommended you be admitted to the hospital for further management of his fluid overload and patient is in agreement with plan.  Final Clinical Impression(s) / ED Diagnoses Final diagnoses:  Acute on chronic diastolic CHF (congestive heart failure) ([place])    Rx / DC Orders ED Discharge Orders    None       Hayden Rasmussen, MD 06/30/20 1450

## 2020-06-29 NOTE — ED Notes (Signed)
Date and time results received: 06/29/20 2021  Test: glucose Critical Value: 587  Name of Provider Notified: Melina Copa, MD  Orders Received? Or Actions Taken?: acknowledged

## 2020-06-29 NOTE — ED Triage Notes (Signed)
Pt c/o shortness of breath and sats 98% on RA. Patient states it got progressively worse today and has a history of COPD. Pt states he has been vaccinated for Covid.

## 2020-06-30 ENCOUNTER — Other Ambulatory Visit: Payer: Self-pay | Admitting: *Deleted

## 2020-06-30 ENCOUNTER — Other Ambulatory Visit (HOSPITAL_COMMUNITY): Payer: Self-pay | Admitting: *Deleted

## 2020-06-30 ENCOUNTER — Other Ambulatory Visit (HOSPITAL_COMMUNITY): Payer: Medicare HMO

## 2020-06-30 ENCOUNTER — Telehealth: Payer: Self-pay | Admitting: Family Medicine

## 2020-06-30 DIAGNOSIS — I10 Essential (primary) hypertension: Secondary | ICD-10-CM

## 2020-06-30 DIAGNOSIS — I5033 Acute on chronic diastolic (congestive) heart failure: Secondary | ICD-10-CM

## 2020-06-30 LAB — CBC
HCT: 41.9 % (ref 39.0–52.0)
Hemoglobin: 14.1 g/dL (ref 13.0–17.0)
MCH: 32.1 pg (ref 26.0–34.0)
MCHC: 33.7 g/dL (ref 30.0–36.0)
MCV: 95.4 fL (ref 80.0–100.0)
Platelets: 392 10*3/uL (ref 150–400)
RBC: 4.39 MIL/uL (ref 4.22–5.81)
RDW: 12.9 % (ref 11.5–15.5)
WBC: 8.9 10*3/uL (ref 4.0–10.5)
nRBC: 0 % (ref 0.0–0.2)

## 2020-06-30 LAB — BASIC METABOLIC PANEL
Anion gap: 8 (ref 5–15)
BUN: 16 mg/dL (ref 6–20)
CO2: 33 mmol/L — ABNORMAL HIGH (ref 22–32)
Calcium: 8.6 mg/dL — ABNORMAL LOW (ref 8.9–10.3)
Chloride: 91 mmol/L — ABNORMAL LOW (ref 98–111)
Creatinine, Ser: 1.2 mg/dL (ref 0.61–1.24)
GFR calc Af Amer: >60 mL/min (ref >60)
GFR calc non Af Amer: >60 mL/min (ref >60)
Glucose, Bld: 479 mg/dL — ABNORMAL HIGH (ref 70–99)
Potassium: 4 mmol/L (ref 3.5–5.1)
Sodium: 132 mmol/L — ABNORMAL LOW (ref 135–145)

## 2020-06-30 LAB — HIV ANTIBODY (ROUTINE TESTING W REFLEX): HIV Screen 4th Generation wRfx: NONREACTIVE

## 2020-06-30 MED ORDER — ACCU-CHEK GUIDE W/DEVICE KIT: 1.0000 | PACK | 0 refills | Status: DC

## 2020-06-30 MED ORDER — ACCU-CHEK GUIDE VI STRP: ORAL_STRIP | 2 refills | Status: DC

## 2020-06-30 NOTE — Discharge Instructions (Signed)

## 2020-06-30 NOTE — ED Notes (Signed)
Pt states he is leaving [facility]. MD notified. Iv taken out. Pt ambulatory to waiting room to meet his ride. Explained risk of leaving AMA.

## 2020-06-30 NOTE — Telephone Encounter (Signed)
About labs, he was in hospital yesterday and will follow up with cardiologis

## 2020-06-30 NOTE — Discharge Summary (Addendum)
Physician Discharge Summary  PANTELIS ELGERSMA YTK:[id number] DOB: 10/17/1965 DOA: 06/29/2020  PCP: [name], DO  Admit date: 06/29/2020 Discharge date: 06/30/2020  Admitted From: Home Disposition:  AGAINST MEDICAL ADVICE    Discharge Condition: AGAINST MEDICAL ADVICE    Brief/Interim Summary:  54 y.o. male with medical history significant of asthma, chronic kidney disease stage III, type 2 diabetes mellitus, COPD, coronary artery disease, dyslipidemia and hypertension. Patient presented with acute onset of dyspnea, this morning, 06/29/20,  he felt shortness of breath that progressed throughout the course of the day to the point where he became symptomatic with minimal efforts.   It has been associated with PND, orthopnea, lower extremity edema, cough and wheezing.  The patient was admitted for acute on chronic diastolic CHF.  He was started on IV lasix.  He began feeling better.  However, on the morning of 06/30/20, the patient wanted to leave AMA.  He refused to speak to the physician prior to his leaving.  He refused to sign AMA and left after he was explained the risks of leaving by nursing staff.  Discharge Diagnoses:   1.  Acute on Chronic Diastolic CHF   -initially started on IV lasix -received 2 doses of IV lasix -left AMA on 06/30/20 -11/15/17 Echo--EF 50-55%, no WMA  2.  COPD.   -No signs of acute exacerbation, continue albuterol and Dulera.  3.  Type 2 diabetes mellitus, uncontrolled with hyperglycemia   -06/29/20 A1C-->14.0 -initially started on lower dose Lantus and novolog sliding scale -initially held metformin  3.  Hypertension.   -initially restarted on home dose lisinopril prior to leaving AMA  4.  Depression/anxiety.   -Continue trazodone, tizanidine, hydroxyzine, buspirone.  5.  Chronic kidney disease stage IIIa -baseline creatinine 1.0-1.3  6. History of chronic DVT.  -Continued anticoagulation with apixaban.   Hyponatremia -due to CHF  decompensation -improving with diuresis  Discharge Instructions   Allergies as of 06/30/2020   No Known Allergies     Medication List    STOP taking these medications   hydrOXYzine 25 MG capsule Commonly known as: Vistaril   NovoLOG FlexPen 100 UNIT/ML FlexPen Generic drug: insulin aspart   tiZANidine 4 MG tablet Commonly known as: ZANAFLEX   triamcinolone cream 0.1 % Commonly known as: KENALOG     TAKE these medications   Accu-Chek Guide test strip Generic drug: glucose blood Use as instructed   Accu-Chek Guide w/Device Kit 1 Piece by Does not apply route as directed.   acetaminophen 325 MG tablet Commonly known as: TYLENOL Take 2 tablets (650 mg total) by mouth every 6 (six) hours as needed for mild pain, moderate pain or fever (or Fever >/= 101).   albuterol (2.5 MG/3ML) 0.083% nebulizer solution Commonly known as: PROVENTIL Take 3 mLs (2.5 mg total) by nebulization every 6 (six) hours as needed for wheezing or shortness of breath. Dx---44.1   albuterol 108 (90 Base) MCG/ACT inhaler Commonly known as: VENTOLIN HFA 2 puffs every 6 hours prn   apixaban 5 MG Tabs tablet Commonly known as: Eliquis Take 1 tablet (5 mg total) by mouth 2 (two) times daily.   aspirin EC 81 MG tablet Take 1 tablet (81 mg total) by mouth daily with breakfast.   atorvastatin 80 MG tablet Commonly known as: LIPITOR Take 1 tablet (80 mg total) by mouth daily.   busPIRone 7.5 MG tablet Commonly known as: BUSPAR TAKE (1) TABLET TWICE DAILY.   Dulera 100-5 MCG/ACT Aero Generic drug: mometasone-formoterol Inhale  2 puffs into the lungs in the morning and at bedtime.   famotidine 20 MG tablet Commonly known as: PEPCID Take 20 mg by mouth 2 (two) times daily.   furosemide 40 MG tablet Commonly known as: LASIX Take 1 tablet (40 mg total) by mouth daily. For swelling   gabapentin 600 MG tablet Commonly known as: NEURONTIN Take 1 tablet (600 mg total) by mouth 3 (three) times  daily.   Lantus SoloStar 100 UNIT/ML Solostar Pen Generic drug: insulin glargine Inject 60 Units into the skin every evening.   lisinopril 10 MG tablet Commonly known as: ZESTRIL Take 1 tablet (10 mg total) by mouth at bedtime.   metFORMIN 500 MG tablet Commonly known as: Glucophage Take 1 tablet (500 mg total) by mouth 2 (two) times daily with a meal.   nitroGLYCERIN 0.4 MG SL tablet Commonly known as: NITROSTAT Place 1 tablet (0.4 mg total) under the tongue every 5 (five) minutes as needed. For chest pain.   traZODone 150 MG tablet Commonly known as: DESYREL Take 1 tablet (150 mg total) by mouth at bedtime.       No Known Allergies  Consultations:  none   Procedures/Studies: DG Chest 2 View  Result Date: 06/29/2020 CLINICAL DATA:  Shortness of breath EXAM: CHEST - 2 VIEW COMPARISON:  12/27/2019 FINDINGS: Frontal and lateral views of the chest demonstrate a stable cardiac silhouette. There are small bilateral pleural effusions. Increased central vascular congestion, with diffuse interstitial prominence and Kerley B-lines consistent with interstitial edema. No pneumothorax. IMPRESSION: 1. Findings consistent with interstitial edema and mild fluid overload. Electronically Signed   By: Randa Ngo M.D.   On: 06/29/2020 19:27        Discharge Exam: Vitals:   06/30/20 0730 06/30/20 0800  BP: 123/78 (!) 133/103  Pulse:    Resp: 14 (!) 23  Temp:    SpO2:     Vitals:   06/30/20 0630 06/30/20 0700 06/30/20 0730 06/30/20 0800  BP: 114/73 (!) 134/98 123/78 (!) 133/103  Pulse:      Resp: _0 (!) 23  Temp:      TempSrc:      SpO2:      Weight:      Height:           The results of significant diagnostics from this hospitalization (including imaging, microbiology, ancillary and laboratory) are listed below for reference.    Significant Diagnostic Studies: DG Chest 2 View  Result Date: 06/29/2020 CLINICAL DATA:  Shortness of breath EXAM: CHEST - 2 VIEW  COMPARISON:  12/27/2019 FINDINGS: Frontal and lateral views of the chest demonstrate a stable cardiac silhouette. There are small bilateral pleural effusions. Increased central vascular congestion, with diffuse interstitial prominence and Kerley B-lines consistent with interstitial edema. No pneumothorax. IMPRESSION: 1. Findings consistent with interstitial edema and mild fluid overload. Electronically Signed   By: Randa Ngo M.D.   On: 06/29/2020 19:27     Microbiology: Recent Results (from the past 240 hour(s))  SARS Coronavirus 2 by RT PCR (hospital order, performed in [facility] hospital lab) Nasopharyngeal Nasopharyngeal Swab     Status: None   Collection Time: 06/29/20  8:07 PM   Specimen: Nasopharyngeal Swab  Result Value Ref Range Status   SARS Coronavirus 2 NEGATIVE NEGATIVE Final    Comment: (NOTE) SARS-CoV-2 target nucleic acids are NOT DETECTED.  The SARS-CoV-2 RNA is generally detectable in upper and lower respiratory specimens during the acute phase of infection. The lowest  concentration of SARS-CoV-2 viral copies this assay can detect is 250 copies / mL. A negative result does not preclude SARS-CoV-2 infection and should not be used as the sole basis for treatment or other patient management decisions.  A negative result may occur with improper specimen collection / handling, submission of specimen other than nasopharyngeal swab, presence of viral mutation(s) within the areas targeted by this assay, and inadequate number of viral copies (<250 copies / mL). A negative result must be combined with clinical observations, patient history, and epidemiological information.  Fact Sheet for Patients:   StrictlyIdeas.no  Fact Sheet for Healthcare Providers: BankingDealers.co.za  This test is not yet approved or  cleared by the [place] FDA and has been authorized for detection and/or diagnosis of SARS-CoV-2 by FDA under  an Emergency Use Authorization (EUA).  This EUA will remain in effect (meaning this test can be used) for the duration of the COVID-19 declaration under Section 564(b)(1) of the Act, 21 U.S.C. section 360bbb-3(b)(1), unless the authorization is terminated or revoked sooner.  Performed at [facility], [address] [place], [place] [address]      Labs: Basic Metabolic Panel: Recent Labs  Lab 06/25/20 1454 06/25/20 1454 06/29/20 1934 06/30/20 0458  NA 131*  --  126* 132*  K 4.9   < > 3.9 4.0  CL 87*  --  90* 91*  CO2 26  --  27 33*  GLUCOSE 455*  --  587* 479*  BUN 12  --  16 16  CREATININE 1.21  --  1.14 1.20  CALCIUM 9.1  --  8.5* 8.6*  MG  --   --  1.8  --    < > = values in this interval not displayed.   Liver Function Tests: Recent Labs  Lab 06/25/20 1454  AST 21  ALT 33  ALKPHOS 149*  BILITOT 0.6  PROT 5.9*  ALBUMIN 3.1*   No results for input(s): LIPASE, AMYLASE in the last 168 hours. No results for input(s): AMMONIA in the last 168 hours. CBC: Recent Labs  Lab 06/25/20 1454 06/29/20 1934 06/30/20 0458  WBC 8.5 8.7 8.9  NEUTROABS 6.1  --   --   HGB 15.4 15.4 14.1  HCT 46.2 45.8 41.9  MCV 94 95.0 95.4  PLT 384 420* 392   Cardiac Enzymes: No results for input(s): CKTOTAL, CKMB, CKMBINDEX, TROPONINI in the last 168 hours. BNP: Invalid input(s): POCBNP CBG: No results for input(s): GLUCAP in the last 168 hours.  Time coordinating discharge:  36 minutes  Signed:  Orson Eva, DO [facility] Pager: [phone] 06/30/2020, 8:50 AM

## 2020-06-30 NOTE — Progress Notes (Signed)
Pt aware that he needs appt asap with cardiologist

## 2020-06-30 NOTE — Telephone Encounter (Signed)
Pt returned missed call from [facility]. Wants to be called back. Says whoever called let him a message but it was hard to understand.   (I did not see a note of who called or why so I couldn't help him)

## 2020-07-01 ENCOUNTER — Telehealth: Payer: Self-pay | Admitting: Family Medicine

## 2020-07-01 DIAGNOSIS — R059 Cough, unspecified: Secondary | ICD-10-CM

## 2020-07-01 LAB — HEMOGLOBIN A1C
Hgb A1c MFr Bld: >15.5 % — ABNORMAL HIGH (ref 4.8–5.6)
Mean Plasma Glucose: >398 mg/dL

## 2020-07-01 MED ORDER — FUROSEMIDE 40 MG PO TABS
40.0000 mg | ORAL_TABLET | Freq: Every day | ORAL | 4 refills | Status: DC
Start: 2020-07-01 — End: 2020-09-01

## 2020-07-01 MED ORDER — GABAPENTIN 600 MG PO TABS
600.0000 mg | ORAL_TABLET | Freq: Three times a day (TID) | ORAL | 2 refills | Status: DC
Start: 2020-07-01 — End: 2020-10-07

## 2020-07-01 MED ORDER — BUSPIRONE HCL 7.5 MG PO TABS: ORAL_TABLET | ORAL | 2 refills | Status: DC

## 2020-07-01 MED ORDER — ACCU-CHEK GUIDE VI STRP: ORAL_STRIP | 2 refills | Status: DC

## 2020-07-01 MED ORDER — ATORVASTATIN CALCIUM 80 MG PO TABS: 80.0000 mg | ORAL_TABLET | Freq: Every day | ORAL | 2 refills | Status: AC

## 2020-07-01 MED ORDER — ALBUTEROL SULFATE HFA 108 (90 BASE) MCG/ACT IN AERS: INHALATION_SPRAY | RESPIRATORY_TRACT | 3 refills | Status: DC

## 2020-07-01 MED ORDER — LANTUS SOLOSTAR 100 UNIT/ML ~~LOC~~ SOPN
60.0000 [IU] | PEN_INJECTOR | Freq: Every evening | SUBCUTANEOUS | 2 refills | Status: DC
Start: 2020-07-01 — End: 2020-11-18

## 2020-07-01 MED ORDER — LISINOPRIL 10 MG PO TABS: 10.0000 mg | ORAL_TABLET | Freq: Every day | ORAL | 1 refills | Status: AC

## 2020-07-01 NOTE — Telephone Encounter (Signed)
Remaining refills on meds sent to [place], pt aware

## 2020-07-07 NOTE — Chronic Care Management (AMB) (Signed)
  Chronic Care Management   Outreach Note  07/22/2019 Name: Dustin Cruz MRN: [id number] DOB: 06-Aug-1966  Referred by: [name], DO Reason for referral : Chronic Care Management (RN Follow up)   An unsuccessful telephone outreach was attempted today. The patient was referred to the case management team for assistance with care management and care coordination.   Follow Up Plan: The care management team will reach out to the patient again over the next 45 days.   [name], BSN, RN-BC Embedded Chronic Care Manager [facility] [place] Management Direct Dial: [phone]

## 2020-07-12 ENCOUNTER — Emergency Department (HOSPITAL_COMMUNITY): Payer: 59

## 2020-07-12 ENCOUNTER — Other Ambulatory Visit: Payer: Self-pay

## 2020-07-12 ENCOUNTER — Emergency Department (HOSPITAL_COMMUNITY)
Admission: EM | Admit: 2020-07-12 | Discharge: 2020-07-12 | Discharge status: Against Medical Advice | Payer: 59 | Attending: Emergency Medicine | Admitting: Emergency Medicine

## 2020-07-12 ENCOUNTER — Encounter (HOSPITAL_COMMUNITY): Payer: Self-pay | Admitting: *Deleted

## 2020-07-12 DIAGNOSIS — R0602 Shortness of breath: Secondary | ICD-10-CM | POA: Diagnosis present

## 2020-07-12 DIAGNOSIS — Z5321 Procedure and treatment not carried out due to patient leaving prior to being seen by health care provider: Secondary | ICD-10-CM | POA: Insufficient documentation

## 2020-07-12 NOTE — ED Notes (Signed)
Pt did not notify anyone he was leaving

## 2020-07-12 NOTE — ED Triage Notes (Signed)
Pt c/o sob that started today and has gotten progressively worse throughout the day' pt states he needs O2, his O2 is 100% while in triage

## 2020-07-19 ENCOUNTER — Ambulatory Visit (HOSPITAL_COMMUNITY)
Admission: RE | Admit: 2020-07-19 | Discharge: 2020-07-19 | Disposition: A | Discharge status: Home / Self Care | Payer: 59 | Source: Ambulatory Visit | Attending: Family Medicine | Admitting: Family Medicine

## 2020-07-19 ENCOUNTER — Other Ambulatory Visit: Payer: Self-pay

## 2020-07-19 DIAGNOSIS — R600 Localized edema: Secondary | ICD-10-CM | POA: Diagnosis present

## 2020-07-20 ENCOUNTER — Encounter: Payer: Self-pay | Admitting: Family Medicine

## 2020-07-20 ENCOUNTER — Ambulatory Visit (INDEPENDENT_AMBULATORY_CARE_PROVIDER_SITE_OTHER): Payer: 59 | Admitting: Family Medicine

## 2020-07-20 VITALS — BP 120/79 | HR 70 | Temp 97.1°F | Ht 74.0 in | Wt 220.4 lb

## 2020-07-20 DIAGNOSIS — R600 Localized edema: Secondary | ICD-10-CM

## 2020-07-20 DIAGNOSIS — N183 Chronic kidney disease, stage 3 unspecified: Secondary | ICD-10-CM | POA: Diagnosis not present

## 2020-07-20 DIAGNOSIS — E1122 Type 2 diabetes mellitus with diabetic chronic kidney disease: Secondary | ICD-10-CM

## 2020-07-20 DIAGNOSIS — R590 Localized enlarged lymph nodes: Secondary | ICD-10-CM | POA: Diagnosis not present

## 2020-07-20 MED ORDER — DULERA 200-5 MCG/ACT IN AERO: 2.0000 | INHALATION_SPRAY | Freq: Two times a day (BID) | RESPIRATORY_TRACT | 12 refills | Status: DC

## 2020-07-20 NOTE — Progress Notes (Signed)
Subjective: CC: Lower extremity edema PCP: [name], DO Dustin Cruz is a 54 y.o. male presenting to clinic today for:  1.  Edema Patient reports ongoing lower extremity edema.  He reports it is quite difficult to walk secondary to edema.  Edema is as high as his thighs.  He has been compliant with the Lasix 40 mg daily but he does not feel that this is causing enough urine output.  He reports chronic shortness of breath and does need refills on his inhalers.  He has not been compliant with them.  He has not seen his endocrinologist for follow-up on his uncontrolled diabetes.  Last A1c was over 15.  He has been noncompliant with his sugar medicines.  He was seen in the emergency department for acute on chronic heart failure but left AMA.  He has not tried to schedule appointment with his cardiologist but does feel at this point he would benefit from inpatient diuresis.  He denies any salt intake including frozen foods, prepackaged foods and canned goods.   ROS: Per HPI  No Known Allergies Past Medical History:  Diagnosis Date   Anxiety    Asthma    CKD stage 3 due to type 2 diabetes mellitus (HCC)    COPD (chronic obstructive pulmonary disease) ([place])    Coronary artery disease    s/p BMS to [facility] 10/2010;  Cath 01/22/12 prox 30-40% LAD, LCx ramus w/ hazy 70-80% in-stent restenosis, EF 60% treated medically   Coronary artery disease    s/p BMS to [facility] 10/2010;  Cath 01/22/12 prox 30-40% LAD, LCx ramus w/ hazy 70-80% in-stent restenosis, EF 60% treated medically    Depression    Diabetes mellitus    Type 2   Gastroparesis 05/2013   GERD (gastroesophageal reflux disease)    HTN (hypertension)    Hyperlipidemia    Hyperlipidemia    Major depression, chronic 08/13/2012   Melanoma ([place]) 2007   surgery at [facility], Followed by [facility]   Myocardial infarction [facility]) 2012   Obesity    Tobacco abuse    Tubular adenoma    Urinary retention     Current  Outpatient Medications:    acetaminophen (TYLENOL) 325 MG tablet, Take 2 tablets (650 mg total) by mouth every 6 (six) hours as needed for mild pain, moderate pain or fever (or Fever >/= 101)., Disp: 30 tablet, Rfl: 2   albuterol (PROVENTIL) (2.5 MG/3ML) 0.083% nebulizer solution, Take 3 mLs (2.5 mg total) by nebulization every 6 (six) hours as needed for wheezing or shortness of breath. Dx---44.1, Disp: 540 mL, Rfl: 0   albuterol (VENTOLIN HFA) 108 (90 Base) MCG/ACT inhaler, 2 puffs every 6 hours prn, Disp: 18 g, Rfl: 3   apixaban (ELIQUIS) 5 MG TABS tablet, Take 1 tablet (5 mg total) by mouth 2 (two) times daily., Disp: 60 tablet, Rfl: 11   aspirin EC 81 MG tablet, Take 1 tablet (81 mg total) by mouth daily with breakfast., Disp: 30 tablet, Rfl: 5   atorvastatin (LIPITOR) 80 MG tablet, Take 1 tablet (80 mg total) by mouth daily., Disp: 90 tablet, Rfl: 2   Blood Glucose Monitoring Suppl (ACCU-CHEK GUIDE) w/Device KIT, 1 Piece by Does not apply route as directed., Disp: 1 kit, Rfl: 0   busPIRone (BUSPAR) 7.5 MG tablet, TAKE (1) TABLET TWICE DAILY., Disp: 60 tablet, Rfl: 2   famotidine (PEPCID) 20 MG tablet, Take 20 mg by mouth 2 (two) times daily., Disp: , Rfl:  furosemide (LASIX) 40 MG tablet, Take 1 tablet (40 mg total) by mouth daily. For swelling, Disp: 30 tablet, Rfl: 4   gabapentin (NEURONTIN) 600 MG tablet, Take 1 tablet (600 mg total) by mouth 3 (three) times daily., Disp: 90 tablet, Rfl: 2   glucose blood (ACCU-CHEK GUIDE) test strip, Use as instructed, Disp: 150 each, Rfl: 2   insulin glargine (LANTUS SOLOSTAR) 100 UNIT/ML Solostar Pen, Inject 60 Units into the skin every evening., Disp: 18 mL, Rfl: 2   lisinopril (ZESTRIL) 10 MG tablet, Take 1 tablet (10 mg total) by mouth at bedtime., Disp: 90 tablet, Rfl: 1   metFORMIN (GLUCOPHAGE) 500 MG tablet, Take 1 tablet (500 mg total) by mouth 2 (two) times daily with a meal., Disp: 180 tablet, Rfl: 1   mometasone-formoterol  (DULERA) 100-5 MCG/ACT AERO, Inhale 2 puffs into the lungs in the morning and at bedtime., Disp: 8.8 g, Rfl: 0   nitroGLYCERIN (NITROSTAT) 0.4 MG SL tablet, Place 1 tablet (0.4 mg total) under the tongue every 5 (five) minutes as needed. For chest pain., Disp: 25 tablet, Rfl: 3   traZODone (DESYREL) 150 MG tablet, Take 1 tablet (150 mg total) by mouth at bedtime., Disp: 90 tablet, Rfl: 1 Social History   Socioeconomic History   Marital status: Divorced    Spouse name: rayann Schobert   Number of children: 0   Years of education: Not on file   Highest education level: Not on file  Occupational History   Occupation: disabled  Tobacco Use   Smoking status: Current Every Day Smoker    Packs/day: 1.00    Years: 31.00    [name] years: 31.00    Types: Cigarettes    Start date: 10/09/1981   Smokeless tobacco: Never Used   Tobacco comment: 1/2 [place] daily 11/01/17  Vaping Use   Vaping Use: Never used  Substance and Sexual Activity   Alcohol use: No    Alcohol/week: 0.0 standard drinks    Comment: 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   Drug use: No   Sexual activity: Yes  Other Topics Concern   Not on file  Social History Narrative   Lives at home in an apartment alone. He is divorced and does not have any children. He has two brothers but is estranged from one. He only talks with the other one infrequently. He does have contact with his ex wife and they have a good relationship. They help each other out.    Social Determinants of Health   Financial Resource Strain:    Difficulty of Paying Living Expenses: Not on file  Food Insecurity:    Worried About Charity fundraiser in the Last Year: Not on file   [employer] of Food in the Last Year: Not on file  Transportation Needs:    Lack of Transportation (Medical): Not on file   Lack of Transportation (Non-Medical): Not on file  Physical Activity:    Days of  Exercise per Week: Not on file   Minutes of Exercise per Session: Not on file  Stress:    Feeling of Stress : Not on file  Social Connections:    Frequency of Communication with Friends and Family: Not on file   Frequency of Social Gatherings with Friends and Family: Not on file   Attends Religious Services: Not on file   Active Member of Clubs or Organizations: Not on file   Attends Club  or Organization Meetings: Not on file   Marital Status: Not on file  Intimate Partner Violence:    Fear of Current or Ex-Partner: Not on file   Emotionally Abused: Not on file   Physically Abused: Not on file   Sexually Abused: Not on file   Family History  Problem Relation Age of Onset   Stroke Mother    Alcohol abuse Father    Heart disease Other    Other Other        not real familiar with family history   Lung cancer Other    Colon cancer Neg Hx    Colon polyps Neg Hx     Objective: Office vital signs reviewed. BP 120/79    Pulse 70    Temp (!) 97.1 F (36.2 C)    Ht _0  (1.88 m)    Wt 220 lb 6.4 oz (100 kg)    SpO2 95%    BMI 28.30 kg/m   Physical Examination:  General: Awake, alert, chronically ill appearing No acute distress Cardio: regular rate and rhythm, S1S2 heard, no murmurs appreciated Pulm: clear to auscultation bilaterally, no wheezes, rhonchi or rales; normal work of breathing on room air Extremities: warm, 2+pitting edema to thighs bilaterally PIR:JJOACZYS gait  Assessment/ Plan: 55 y.o. male   1. Leg edema Concern for acute on chronic heart failure.  However, I reviewed his 2020 echocardiogram which did show preserved systolic function with a EF of 60 to 65%.  He has known CAD however.  He has been totally noncompliant with his diabetes medications and so I do wonder if he may be has had an event.  Additionally, has known CKD.  I am going to recheck a renal function panel, CBC, BMP, TSH and magnesium.  I have reached out to Stormy Fabian, [facility]  with his [facility] heart care to see if we can coordinate an appointment for reevaluation.  In the meantime, I am going to have him increase his Lasix to 60 mg daily.  He will come in 3 days for repeat BMP. Hopefully this will improve his urine output.  Avoid salt.  Elevate lower extremities.  I reviewed his venous ultrasound which showed no evidence of DVT.  However, there was lymphadenopathy in the pelvis.  We will plan to repeat imaging with a CT abd/ pelvis to rule out malignancy in about 1 month. - Renal Function Panel - CBC - Brain natriuretic peptide - TSH - Magnesium  2. CKD stage 3 due to type 2 diabetes mellitus (HCC) - Renal Function Panel - CBC - Magnesium  3. Pelvic lymphadenopathy Needs CT abd/ pelvis in 4-5 weeks.    No orders of the defined types were placed in this encounter.  No orders of the defined types were placed in this encounter.    [name], DO [facility] [phone]

## 2020-07-21 LAB — RENAL FUNCTION PANEL
Albumin: 2.9 g/dL — ABNORMAL LOW (ref 3.8–4.9)
BUN/Creatinine Ratio: 11 (ref 9–20)
BUN: 13 mg/dL (ref 6–24)
CO2: 28 mmol/L (ref 20–29)
Calcium: 8.6 mg/dL — ABNORMAL LOW (ref 8.7–10.2)
Chloride: 90 mmol/L — ABNORMAL LOW (ref 96–106)
Creatinine, Ser: 1.2 mg/dL (ref 0.76–1.27)
GFR calc Af Amer: 78 mL/min/{1.73_m2} (ref >59)
GFR calc non Af Amer: 68 mL/min/{1.73_m2} (ref >59)
Glucose: 645 mg/dL (ref 65–99)
Phosphorus: 3.9 mg/dL (ref 2.8–4.1)
Potassium: 4.2 mmol/L (ref 3.5–5.2)
Sodium: 129 mmol/L — ABNORMAL LOW (ref 134–144)

## 2020-07-21 LAB — CBC
Hematocrit: 42.3 % (ref 37.5–51.0)
Hemoglobin: 13.7 g/dL (ref 13.0–17.7)
MCH: 31.6 pg (ref 26.6–33.0)
MCHC: 32.4 g/dL (ref 31.5–35.7)
MCV: 98 fL — ABNORMAL HIGH (ref 79–97)
Platelets: 328 10*3/uL (ref 150–450)
RBC: 4.33 x10E6/uL (ref 4.14–5.80)
RDW: 12.5 % (ref 11.6–15.4)
WBC: 6.7 10*3/uL (ref 3.4–10.8)

## 2020-07-21 LAB — BRAIN NATRIURETIC PEPTIDE: BNP: 1630.9 pg/mL — ABNORMAL HIGH (ref 0.0–100.0)

## 2020-07-21 LAB — MAGNESIUM: Magnesium: 1.8 mg/dL (ref 1.6–2.3)

## 2020-07-21 LAB — TSH: TSH: 1.06 u[IU]/mL (ref 0.450–4.500)

## 2020-07-23 ENCOUNTER — Encounter: Payer: Self-pay | Admitting: Student

## 2020-07-23 ENCOUNTER — Other Ambulatory Visit: Payer: Self-pay

## 2020-07-23 ENCOUNTER — Inpatient Hospital Stay (HOSPITAL_COMMUNITY)
Admission: EM | Admit: 2020-07-23 | Discharge: 2020-07-24 | DRG: 292 | Discharge status: Against Medical Advice | Payer: 59 | Source: Ambulatory Visit | Attending: Family Medicine | Admitting: Family Medicine

## 2020-07-23 ENCOUNTER — Encounter (HOSPITAL_COMMUNITY): Payer: Self-pay | Admitting: Family Medicine

## 2020-07-23 ENCOUNTER — Emergency Department (HOSPITAL_COMMUNITY): Payer: 59

## 2020-07-23 ENCOUNTER — Ambulatory Visit (INDEPENDENT_AMBULATORY_CARE_PROVIDER_SITE_OTHER): Payer: 59 | Admitting: Student

## 2020-07-23 VITALS — BP 138/80 | HR 104 | Ht 74.0 in | Wt 221.0 lb

## 2020-07-23 DIAGNOSIS — E782 Mixed hyperlipidemia: Secondary | ICD-10-CM | POA: Diagnosis present

## 2020-07-23 DIAGNOSIS — E1129 Type 2 diabetes mellitus with other diabetic kidney complication: Secondary | ICD-10-CM | POA: Diagnosis not present

## 2020-07-23 DIAGNOSIS — E1169 Type 2 diabetes mellitus with other specified complication: Secondary | ICD-10-CM | POA: Diagnosis present

## 2020-07-23 DIAGNOSIS — K219 Gastro-esophageal reflux disease without esophagitis: Secondary | ICD-10-CM | POA: Diagnosis present

## 2020-07-23 DIAGNOSIS — R635 Abnormal weight gain: Secondary | ICD-10-CM | POA: Diagnosis present

## 2020-07-23 DIAGNOSIS — I10 Essential (primary) hypertension: Secondary | ICD-10-CM | POA: Diagnosis present

## 2020-07-23 DIAGNOSIS — Z20822 Contact with and (suspected) exposure to covid-19: Secondary | ICD-10-CM | POA: Diagnosis present

## 2020-07-23 DIAGNOSIS — I5033 Acute on chronic diastolic (congestive) heart failure: Secondary | ICD-10-CM | POA: Diagnosis present

## 2020-07-23 DIAGNOSIS — N1831 Chronic kidney disease, stage 3a: Secondary | ICD-10-CM

## 2020-07-23 DIAGNOSIS — I252 Old myocardial infarction: Secondary | ICD-10-CM

## 2020-07-23 DIAGNOSIS — G629 Polyneuropathy, unspecified: Secondary | ICD-10-CM

## 2020-07-23 DIAGNOSIS — N179 Acute kidney failure, unspecified: Secondary | ICD-10-CM | POA: Diagnosis present

## 2020-07-23 DIAGNOSIS — Z5329 Procedure and treatment not carried out because of patient's decision for other reasons: Secondary | ICD-10-CM | POA: Diagnosis present

## 2020-07-23 DIAGNOSIS — I5031 Acute diastolic (congestive) heart failure: Secondary | ICD-10-CM | POA: Diagnosis present

## 2020-07-23 DIAGNOSIS — E781 Pure hyperglyceridemia: Secondary | ICD-10-CM

## 2020-07-23 DIAGNOSIS — E1122 Type 2 diabetes mellitus with diabetic chronic kidney disease: Secondary | ICD-10-CM | POA: Diagnosis present

## 2020-07-23 DIAGNOSIS — I2510 Atherosclerotic heart disease of native coronary artery without angina pectoris: Secondary | ICD-10-CM | POA: Diagnosis present

## 2020-07-23 DIAGNOSIS — R350 Frequency of micturition: Secondary | ICD-10-CM | POA: Diagnosis not present

## 2020-07-23 DIAGNOSIS — Z79899 Other long term (current) drug therapy: Secondary | ICD-10-CM

## 2020-07-23 DIAGNOSIS — E1165 Type 2 diabetes mellitus with hyperglycemia: Secondary | ICD-10-CM | POA: Diagnosis present

## 2020-07-23 DIAGNOSIS — E785 Hyperlipidemia, unspecified: Secondary | ICD-10-CM | POA: Diagnosis not present

## 2020-07-23 DIAGNOSIS — Z9119 Patient's noncompliance with other medical treatment and regimen: Secondary | ICD-10-CM | POA: Diagnosis not present

## 2020-07-23 DIAGNOSIS — E1143 Type 2 diabetes mellitus with diabetic autonomic (poly)neuropathy: Secondary | ICD-10-CM | POA: Diagnosis present

## 2020-07-23 DIAGNOSIS — E11319 Type 2 diabetes mellitus with unspecified diabetic retinopathy without macular edema: Secondary | ICD-10-CM | POA: Diagnosis present

## 2020-07-23 DIAGNOSIS — I152 Hypertension secondary to endocrine disorders: Secondary | ICD-10-CM | POA: Diagnosis present

## 2020-07-23 DIAGNOSIS — Z8249 Family history of ischemic heart disease and other diseases of the circulatory system: Secondary | ICD-10-CM

## 2020-07-23 DIAGNOSIS — E1159 Type 2 diabetes mellitus with other circulatory complications: Secondary | ICD-10-CM | POA: Diagnosis not present

## 2020-07-23 DIAGNOSIS — Z7984 Long term (current) use of oral hypoglycemic drugs: Secondary | ICD-10-CM

## 2020-07-23 DIAGNOSIS — Z85820 Personal history of malignant melanoma of skin: Secondary | ICD-10-CM

## 2020-07-23 DIAGNOSIS — F17210 Nicotine dependence, cigarettes, uncomplicated: Secondary | ICD-10-CM | POA: Diagnosis present

## 2020-07-23 DIAGNOSIS — G63 Polyneuropathy in diseases classified elsewhere: Secondary | ICD-10-CM | POA: Diagnosis not present

## 2020-07-23 DIAGNOSIS — R739 Hyperglycemia, unspecified: Secondary | ICD-10-CM | POA: Diagnosis present

## 2020-07-23 DIAGNOSIS — N1832 Chronic kidney disease, stage 3b: Secondary | ICD-10-CM | POA: Diagnosis present

## 2020-07-23 DIAGNOSIS — J449 Chronic obstructive pulmonary disease, unspecified: Secondary | ICD-10-CM | POA: Diagnosis present

## 2020-07-23 DIAGNOSIS — Z7982 Long term (current) use of aspirin: Secondary | ICD-10-CM

## 2020-07-23 DIAGNOSIS — R601 Generalized edema: Secondary | ICD-10-CM

## 2020-07-23 DIAGNOSIS — K3184 Gastroparesis: Secondary | ICD-10-CM | POA: Diagnosis present

## 2020-07-23 DIAGNOSIS — Z91199 Patient's noncompliance with other medical treatment and regimen due to unspecified reason: Secondary | ICD-10-CM

## 2020-07-23 DIAGNOSIS — Z7901 Long term (current) use of anticoagulants: Secondary | ICD-10-CM

## 2020-07-23 DIAGNOSIS — Z794 Long term (current) use of insulin: Secondary | ICD-10-CM

## 2020-07-23 DIAGNOSIS — Z86718 Personal history of other venous thrombosis and embolism: Secondary | ICD-10-CM

## 2020-07-23 DIAGNOSIS — R809 Proteinuria, unspecified: Secondary | ICD-10-CM

## 2020-07-23 DIAGNOSIS — Z955 Presence of coronary angioplasty implant and graft: Secondary | ICD-10-CM

## 2020-07-23 LAB — CBC WITH DIFFERENTIAL/PLATELET
Abs Immature Granulocytes: 0.02 10*3/uL (ref 0.00–0.07)
Basophils Absolute: 0.1 10*3/uL (ref 0.0–0.1)
Basophils Relative: 1 %
Eosinophils Absolute: 0 10*3/uL (ref 0.0–0.5)
Eosinophils Relative: 1 %
HCT: 39.9 % (ref 39.0–52.0)
Hemoglobin: 13.4 g/dL (ref 13.0–17.0)
Immature Granulocytes: 0 %
Lymphocytes Relative: 13 %
Lymphs Abs: 1.2 10*3/uL (ref 0.7–4.0)
MCH: 32.1 pg (ref 26.0–34.0)
MCHC: 33.6 g/dL (ref 30.0–36.0)
MCV: 95.7 fL (ref 80.0–100.0)
Monocytes Absolute: 0.8 10*3/uL (ref 0.1–1.0)
Monocytes Relative: 10 %
Neutro Abs: 6.6 10*3/uL (ref 1.7–7.7)
Neutrophils Relative %: 75 %
Platelets: 341 10*3/uL (ref 150–400)
RBC: 4.17 MIL/uL — ABNORMAL LOW (ref 4.22–5.81)
RDW: 14.2 % (ref 11.5–15.5)
WBC: 8.8 10*3/uL (ref 4.0–10.5)
nRBC: 0 % (ref 0.0–0.2)

## 2020-07-23 LAB — RESPIRATORY PANEL BY RT PCR (FLU A&B, COVID)
Influenza A by PCR: NEGATIVE
Influenza B by PCR: NEGATIVE
SARS Coronavirus 2 by RT PCR: NEGATIVE

## 2020-07-23 LAB — BASIC METABOLIC PANEL
Anion gap: 10 (ref 5–15)
BUN: 24 mg/dL — ABNORMAL HIGH (ref 6–20)
CO2: 26 mmol/L (ref 22–32)
Calcium: 8.4 mg/dL — ABNORMAL LOW (ref 8.9–10.3)
Chloride: 90 mmol/L — ABNORMAL LOW (ref 98–111)
Creatinine, Ser: 1.29 mg/dL — ABNORMAL HIGH (ref 0.61–1.24)
GFR, Estimated: >60 mL/min (ref >60)
Glucose, Bld: 609 mg/dL (ref 70–99)
Potassium: 4.1 mmol/L (ref 3.5–5.1)
Sodium: 126 mmol/L — ABNORMAL LOW (ref 135–145)

## 2020-07-23 LAB — CBG MONITORING, ED
Glucose-Capillary: 356 mg/dL — ABNORMAL HIGH (ref 70–99)
Glucose-Capillary: 129 mg/dL — ABNORMAL HIGH (ref 70–99)
Glucose-Capillary: 102 mg/dL — ABNORMAL HIGH (ref 70–99)

## 2020-07-23 LAB — BRAIN NATRIURETIC PEPTIDE: B Natriuretic Peptide: 1465 pg/mL — ABNORMAL HIGH (ref 0.0–100.0)

## 2020-07-23 LAB — TROPONIN I (HIGH SENSITIVITY): Troponin I (High Sensitivity): 75 ng/L — ABNORMAL HIGH (ref <18)

## 2020-07-23 LAB — TSH: TSH: 1.008 u[IU]/mL (ref 0.350–4.500)

## 2020-07-23 LAB — VITAMIN D 25 HYDROXY (VIT D DEFICIENCY, FRACTURES): Vit D, 25-Hydroxy: 15.96 ng/mL — ABNORMAL LOW (ref 30–100)

## 2020-07-23 MED ORDER — POTASSIUM CHLORIDE CRYS ER 20 MEQ PO TBCR
30.0000 meq | EXTENDED_RELEASE_TABLET | Freq: Two times a day (BID) | ORAL | Status: DC
  Administered 2020-07-24 (×2): 30 meq via ORAL
  Filled 2020-07-23 (×3): qty 2

## 2020-07-23 MED ORDER — GABAPENTIN 300 MG PO CAPS: 600.0000 mg | ORAL_CAPSULE | Freq: Three times a day (TID) | ORAL | Status: DC | PRN

## 2020-07-23 MED ORDER — SODIUM CHLORIDE 0.9 % IV SOLN: 250.0000 mL | INTRAVENOUS | Status: DC | PRN

## 2020-07-23 MED ORDER — NITROGLYCERIN 0.4 MG SL SUBL: 0.4000 mg | SUBLINGUAL_TABLET | SUBLINGUAL | Status: DC | PRN

## 2020-07-23 MED ORDER — INSULIN ASPART 100 UNIT/ML ~~LOC~~ SOLN
25.0000 [IU] | Freq: Once | SUBCUTANEOUS | Status: AC
  Administered 2020-07-23: 25 [IU] via SUBCUTANEOUS
  Filled 2020-07-23: qty 1

## 2020-07-23 MED ORDER — BUSPIRONE HCL 5 MG PO TABS
7.5000 mg | ORAL_TABLET | Freq: Two times a day (BID) | ORAL | Status: DC
  Administered 2020-07-23 – 2020-07-24 (×3): 7.5 mg via ORAL
  Filled 2020-07-23 (×3): qty 2

## 2020-07-23 MED ORDER — INSULIN ASPART 100 UNIT/ML ~~LOC~~ SOLN: 0.0000 [IU] | Freq: Every day | SUBCUTANEOUS | Status: DC

## 2020-07-23 MED ORDER — SODIUM CHLORIDE 0.9% FLUSH: 3.0000 mL | INTRAVENOUS | Status: DC | PRN

## 2020-07-23 MED ORDER — ATORVASTATIN CALCIUM 40 MG PO TABS
80.0000 mg | ORAL_TABLET | Freq: Every evening | ORAL | Status: DC
  Administered 2020-07-23: 80 mg via ORAL
  Filled 2020-07-23: qty 2

## 2020-07-23 MED ORDER — ALBUTEROL SULFATE (2.5 MG/3ML) 0.083% IN NEBU: 2.5000 mg | INHALATION_SOLUTION | Freq: Four times a day (QID) | RESPIRATORY_TRACT | Status: DC | PRN

## 2020-07-23 MED ORDER — NICOTINE 21 MG/24HR TD PT24
21.0000 mg | MEDICATED_PATCH | Freq: Every day | TRANSDERMAL | Status: DC
Start: 2020-07-23 — End: 2020-07-24
  Administered 2020-07-23 – 2020-07-24 (×2): 21 mg via TRANSDERMAL
  Filled 2020-07-23 (×2): qty 1

## 2020-07-23 MED ORDER — APIXABAN 5 MG PO TABS
5.0000 mg | ORAL_TABLET | Freq: Two times a day (BID) | ORAL | Status: DC
  Administered 2020-07-23 – 2020-07-24 (×2): 5 mg via ORAL
  Filled 2020-07-23 (×2): qty 1

## 2020-07-23 MED ORDER — ASPIRIN EC 81 MG PO TBEC
81.0000 mg | DELAYED_RELEASE_TABLET | Freq: Every day | ORAL | Status: DC
  Administered 2020-07-24: 81 mg via ORAL
  Filled 2020-07-23: qty 1

## 2020-07-23 MED ORDER — INSULIN ASPART 100 UNIT/ML ~~LOC~~ SOLN
16.0000 [IU] | Freq: Three times a day (TID) | SUBCUTANEOUS | Status: DC
  Administered 2020-07-23: 16 [IU] via SUBCUTANEOUS
  Filled 2020-07-23: qty 1

## 2020-07-23 MED ORDER — LISINOPRIL 10 MG PO TABS: 10.0000 mg | ORAL_TABLET | Freq: Every day | ORAL | Status: DC

## 2020-07-23 MED ORDER — INSULIN ASPART 100 UNIT/ML ~~LOC~~ SOLN
0.0000 [IU] | Freq: Three times a day (TID) | SUBCUTANEOUS | Status: DC
  Administered 2020-07-23: 3 [IU] via SUBCUTANEOUS
  Administered 2020-07-24: 7 [IU] via SUBCUTANEOUS
  Filled 2020-07-23 (×2): qty 1

## 2020-07-23 MED ORDER — FUROSEMIDE 10 MG/ML IJ SOLN
40.0000 mg | Freq: Two times a day (BID) | INTRAMUSCULAR | Status: DC
  Administered 2020-07-23 – 2020-07-24 (×2): 40 mg via INTRAVENOUS
  Filled 2020-07-23 (×2): qty 4

## 2020-07-23 MED ORDER — FUROSEMIDE 10 MG/ML IJ SOLN
60.0000 mg | Freq: Once | INTRAMUSCULAR | Status: AC
  Administered 2020-07-23: 60 mg via INTRAVENOUS
  Filled 2020-07-23: qty 6

## 2020-07-23 MED ORDER — ONDANSETRON HCL 4 MG/2ML IJ SOLN: 4.0000 mg | Freq: Four times a day (QID) | INTRAMUSCULAR | Status: DC | PRN

## 2020-07-23 MED ORDER — INSULIN GLARGINE 100 UNIT/ML ~~LOC~~ SOLN
60.0000 [IU] | Freq: Every day | SUBCUTANEOUS | Status: DC
  Administered 2020-07-23: 60 [IU] via SUBCUTANEOUS
  Filled 2020-07-23 (×3): qty 0.6

## 2020-07-23 MED ORDER — ACETAMINOPHEN 325 MG PO TABS: 650.0000 mg | ORAL_TABLET | ORAL | Status: DC | PRN

## 2020-07-23 MED ORDER — TRAZODONE HCL 50 MG PO TABS
150.0000 mg | ORAL_TABLET | Freq: Every day | ORAL | Status: DC
  Administered 2020-07-24: 150 mg via ORAL
  Filled 2020-07-23: qty 3

## 2020-07-23 MED ORDER — SODIUM CHLORIDE 0.9% FLUSH
3.0000 mL | Freq: Two times a day (BID) | INTRAVENOUS | Status: DC
  Administered 2020-07-24: 3 mL via INTRAVENOUS

## 2020-07-23 MED ORDER — MOMETASONE FURO-FORMOTEROL FUM 200-5 MCG/ACT IN AERO
2.0000 | INHALATION_SPRAY | Freq: Two times a day (BID) | RESPIRATORY_TRACT | Status: DC
  Administered 2020-07-23 – 2020-07-24 (×2): 2 via RESPIRATORY_TRACT
  Filled 2020-07-23 (×2): qty 8.8

## 2020-07-23 MED ORDER — ALBUTEROL SULFATE HFA 108 (90 BASE) MCG/ACT IN AERS
2.0000 | INHALATION_SPRAY | RESPIRATORY_TRACT | Status: DC | PRN
  Administered 2020-07-23: 2 via RESPIRATORY_TRACT
  Filled 2020-07-23: qty 6.7

## 2020-07-23 NOTE — ED Notes (Signed)
Pt given dinner tray.

## 2020-07-23 NOTE — ED Provider Notes (Signed)
Emergency Department Provider Note   I have reviewed the triage vital signs and the nursing notes.   HISTORY  Chief Complaint Congestive Heart Failure   HPI Dustin Cruz is a 54 y.o. male with PMH reviewed below including CHF and CKD presents to the emergency department from his cardiologist office for CHF exacerbation.  Patient has had progressively worsening fluid retention in his legs and now lower abdomen despite escalating doses of Lasix as an outpatient.  He has been compliant with his medications as well as fluid restriction.  He has developed some associated dyspnea and orthopnea.  Was seen by his PCP and cardiology recently including in the office today.  He is up at least 30 pounds and was sent to the ED for IV diuresis and admission.  I spoke with the cardiology team caring for him in the office today and they will follow along in the inpatient setting. Patient denies any chest pain or fever symptoms.   Past Medical History:  Diagnosis Date  . Anxiety   . Asthma   . CKD stage 3 due to type 2 diabetes mellitus ([place])   . COPD (chronic obstructive pulmonary disease) ([place])   . Coronary artery disease    s/p BMS to [facility] 10/2010;  Cath 01/22/12 prox 30-40% LAD, LCx ramus w/ hazy 70-80% in-stent restenosis, EF 60% treated medically  . Coronary artery disease    s/p BMS to [facility] 10/2010;  Cath 01/22/12 prox 30-40% LAD, LCx ramus w/ hazy 70-80% in-stent restenosis, EF 60% treated medically   . Depression   . Diabetes mellitus    Type 2  . Gastroparesis 05/2013  . GERD (gastroesophageal reflux disease)   . HTN (hypertension)   . Hyperlipidemia   . Hyperlipidemia   . Major depression, chronic 08/13/2012  . Melanoma ([place]) 2007   surgery at [facility], Followed by [place]  . Myocardial infarction ([place]) 2012  . Obesity   . Tobacco abuse   . Tubular adenoma   . Urinary retention     Patient Active Problem List   Diagnosis Date Noted  . Acute diastolic CHF (congestive heart  failure) ([place]) 07/23/2020  . Abnormal weight gain 07/23/2020  . Noncompliance 07/23/2020  . Pelvic lymphadenopathy 07/20/2020  . Acute on chronic diastolic CHF (congestive heart failure) ([place]) 06/30/2020  . CHF (congestive heart failure) ([place]) 06/29/2020  . Pre-ulcerative calluses 10/29/2019  . Musculoskeletal pain 10/13/2019  . Housing problems 05/23/2019  . Essential hypertension, benign 03/20/2019  . 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 Near syncope 03/17/2019  . AKI (acute kidney injury) ([place]) 03/17/2019  . 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 CKD stage 3 due to type 2 diabetes mellitus ([place]) 11/28/2018  . Microalbuminuria due to type 2 diabetes mellitus ([place]) 05/22/2018  . Hyperglycemia 04/24/2018  . Abdominal pain, epigastric 11/29/2017  . Diarrhea 11/29/2017  . History of colonic polyps 11/29/2017  . Diffuse abdominal pain 11/29/2017  . Diabetic retinopathy ([place]) 09/21/2015  . Carotid bruit 09/21/2015  . Viral gastroenteritis 06/03/2015  . Hypertriglyceridemia 05/18/2015  . Vitamin D deficiency 12/29/2013  . Nausea alone 04/24/2013  . Frequency of urination 04/14/2013  . Fever, unspecified 04/14/2013  . Hepatic steatosis 12/28/2012  . Peripheral neuropathy 11/25/2012  . Melanoma of skin, site unspecified 11/25/2012  . Mixed hyperlipidemia   . Depression   . Hypertension associated with diabetes ([place])   . GERD (gastroesophageal reflux disease) 04/03/2012  . Current smoker 03/14/2012  . Type 2 diabetes mellitus with stage 3a chronic kidney  disease, with Tynia Wiers-term current use of insulin ([place])   . 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 BIOPSY  01/07/2018   Procedure: BIOPSY;  Surgeon: Daneil Dolin, MD;  [facility];  Service: Endoscopy;;  duodenal and gastric biopsy  . BIOPSY  04/25/2018   Procedure: BIOPSY;  Surgeon: Daneil Dolin, MD;  Location: [facility];  Service: Endoscopy;;   ascending and descending and sigmoid biopsies  . CARDIAC CATHETERIZATION     with stent  . COLONOSCOPY N/A 01/27/2013   YOV:ZCHYIF and colonic polyps. Tubular adenomas, poor bowel prep, one-year follow-up surveillance colonoscopy recommended  . COLONOSCOPY WITH PROPOFOL N/A 04/25/2018   Procedure: COLONOSCOPY WITH PROPOFOL;  Surgeon: Daneil Dolin, MD;  Location: [facility];  Service: Endoscopy;  Laterality: N/A;  11:00am - pt to be prepped INPT and labs to be done as well  . CORONARY ANGIOPLASTY WITH STENT PLACEMENT    . ESOPHAGOGASTRODUODENOSCOPY  04/24/2012   [place]k-mild erosive reflux esophagitis,dilated w/37F Venia Minks, small HH, minimal chronic gastric/bulbar erosions(No H pylori)  . ESOPHAGOGASTRODUODENOSCOPY (EGD) WITH PROPOFOL N/A 01/07/2018   Dr. Gala Romney: Erythematous mucosa in the stomach, retained gastric contents, incomplete exam.  Biopsied showed mild chronic gastritis but no H. pylori.  Duodenal biopsy was negative for celiac disease.  [name] FLEXIBLE SIGMOIDOSCOPY N/A 01/07/2018   Dr. Gala Romney: Incomplete colonoscopy due to inadequate bowel prep  . LEFT HEART CATHETERIZATION WITH CORONARY ANGIOGRAM N/A 01/22/2012   Procedure: LEFT HEART CATHETERIZATION WITH CORONARY ANGIOGRAM;  Surgeon: Jolaine Artist, MD;  Location: [facility];  Service: Cardiovascular;  Laterality: N/A;  . melanoma surgery  2007   [place], removed lymph nodes under arm as well,Left abd    Allergies Patient has no known allergies.  Family History  Problem Relation Age of Onset  . Stroke Mother   . Alcohol abuse Father   . Heart disease Other   . Other Other        not real familiar with family history  . Lung cancer Other   . Colon cancer Neg Hx   . Colon polyps Neg Hx     Social History Social History   Tobacco Use  . Smoking status: Current Every Day Smoker    Packs/day: 1.00    Years: 31.00    Bier years: 31.00    Types: Cigarettes    Start date: 10/09/1981  . Smokeless tobacco: Never Used  . Tobacco  comment: 1/2 Velis daily 11/01/17  Vaping Use  . Vaping Use: Never used  Substance Use Topics  . 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  . Drug use: No    Review of Systems  Constitutional: No fever/chills Eyes: No visual changes. ENT: No sore throat. Cardiovascular: Denies chest pain. Positive orthopnea and fluid retention.  Respiratory: Positive shortness of breath. Gastrointestinal: No abdominal pain.  No nausea, no vomiting.  No diarrhea.  No constipation. Genitourinary: Negative for dysuria. Musculoskeletal: Negative for back pain. Skin: Negative for rash. Neurological: Negative for headaches, focal weakness or numbness.  10-point ROS otherwise negative.  ____________________________________________   PHYSICAL EXAM:  VITAL SIGNS: ED Triage Vitals  Enc Vitals Group     BP 07/23/20 1330 (!) 136/92     Pulse Rate 07/23/20 1330 (!) 102     Resp 07/23/20  1330 19     Temp --      Temp src --      SpO2 07/23/20 1330 95 %     Weight 07/23/20 1323 222 lb (100.7 kg)     Height 07/23/20 1318 6\' 2"  (1.88 m)   Constitutional: Alert and oriented. Well appearing and in no acute distress. Eyes: Conjunctivae are normal.  Head: Atraumatic. Nose: No congestion/rhinnorhea. Mouth/Throat: Mucous membranes are moist.  Neck: No stridor. Cardiovascular: Tachycardia. Good peripheral circulation. Grossly normal heart sounds.   Respiratory: Normal respiratory effort.  No retractions. Lungs with rales at the bases.  Gastrointestinal: Soft and nontender. Positive distention with lower pitting edema.  Musculoskeletal: Patient with bilateral pitting edema which is significant and rising up to the level of the lower abdomen.  Neurologic:  Normal speech and language. No gross focal neurologic deficits are appreciated.  Skin:  Skin is warm, dry and intact. No rash  noted.   ____________________________________________   LABS (all labs ordered are listed, but only abnormal results are displayed)  Labs Reviewed  BASIC METABOLIC PANEL - Abnormal; Notable for the following components:      Result Value   Sodium 126 (*)    Chloride 90 (*)    Glucose, Bld 609 (*)    BUN 24 (*)    Creatinine, Ser 1.29 (*)    Calcium 8.4 (*)    All other components within normal limits  BRAIN NATRIURETIC PEPTIDE - Abnormal; Notable for the following components:   B Natriuretic Peptide 1,465.0 (*)    All other components within normal limits  CBC WITH DIFFERENTIAL/PLATELET - Abnormal; Notable for the following components:   RBC 4.17 (*)    All other components within normal limits  VITAMIN D 25 HYDROXY (VIT D DEFICIENCY, FRACTURES) - Abnormal; Notable for the following components:   Vit D, 25-Hydroxy 15.96 (*)    All other components within normal limits  BASIC METABOLIC PANEL - Abnormal; Notable for the following components:   Chloride 94 (*)    BUN 26 (*)    Creatinine, Ser 1.40 (*)    Calcium 8.5 (*)    GFR, Estimated 56 (*)    All other components within normal limits  BRAIN NATRIURETIC PEPTIDE - Abnormal; Notable for the following components:   B Natriuretic Peptide 1,220.0 (*)    All other components within normal limits  LIPID PANEL - Abnormal; Notable for the following components:   Cholesterol 236 (*)    LDL Cholesterol 143 (*)    All other components within normal limits  CBG MONITORING, ED - Abnormal; Notable for the following components:   Glucose-Capillary 356 (*)    All other components within normal limits  CBG MONITORING, ED - Abnormal; Notable for the following components:   Glucose-Capillary 129 (*)    All other components within normal limits  CBG MONITORING, ED - Abnormal; Notable for the following components:   Glucose-Capillary 102 (*)    All other components within normal limits  CBG MONITORING, ED - Abnormal; Notable for the  following components:   Glucose-Capillary 230 (*)    All other components within normal limits  TROPONIN I (HIGH SENSITIVITY) - Abnormal; Notable for the following components:   Troponin I (High Sensitivity) 75 (*)    All other components within normal limits  RESPIRATORY PANEL BY RT PCR (FLU A&B, COVID)  TSH  RAPID URINE DRUG SCREEN, HOSP PERFORMED  MAGNESIUM  CBG MONITORING, ED   ____________________________________________  RADIOLOGY  CXR  reviewed.  ____________________________________________   PROCEDURES  Procedure(s) performed:   Procedures  CRITICAL CARE Performed by: Margette Fast Total critical care time: 35 minutes Critical care time was exclusive of separately billable procedures and treating other patients. Critical care was necessary to treat or prevent imminent or life-threatening deterioration. Critical care was time spent personally by me on the following activities: development of treatment plan with patient and/or surrogate as well as nursing, discussions with consultants, evaluation of patient's response to treatment, examination of patient, obtaining history from patient or surrogate, ordering and performing treatments and interventions, ordering and review of laboratory studies, ordering and review of radiographic studies, pulse oximetry and re-evaluation of patient's condition.  Nanda Quinton, MD Emergency Medicine  ____________________________________________   INITIAL IMPRESSION / ASSESSMENT AND PLAN / ED COURSE  Pertinent labs & imaging results that were available during my care of the patient were reviewed by me and considered in my medical decision making (see chart for details).   Patient presents to the emergency department with essentially anasarca.  He is up at least 30 pounds from his dry weight despite escalating Lasix dosing as an outpatient.  He is in no acute respiratory distress and not hypoxic but is not responding to outpatient PO lasix.  CXR with no pulmonary edema. Plan for lasix here, repeat labs, and admit.   Labs and imaging reviewed. Plan for admit.   Discussed patient's case with [facility] to request admission. Patient and family (if present) updated with plan. Care transferred to [facility] service.  I reviewed all nursing notes, vitals, pertinent old records, EKGs, labs, imaging (as available).  ____________________________________________  FINAL CLINICAL IMPRESSION(S) / ED DIAGNOSES  Final diagnoses:  Anasarca    MEDICATIONS GIVEN DURING THIS VISIT:  Medications  furosemide (LASIX) injection 60 mg (60 mg Intravenous Given 07/23/20 1346)  insulin aspart (novoLOG) injection 25 Units (25 Units Subcutaneous Given 07/23/20 1548)    Note:  This document was prepared using Dragon voice recognition software and may include unintentional dictation errors.  Nanda Quinton, MD, [facility] Emergency Medicine    Ande Therrell, [facility], MD 07/26/20 [id number]

## 2020-07-23 NOTE — Progress Notes (Signed)
Cardiology Office Note    Date:  07/23/2020   ID:  Dustin Cruz, DOB 05-06-1966, MRN [id number]  PCP:  [name], DO  Cardiologist: [name], MD (Inactive)  --> Will need to establish with Dr. Domenic Polite or Dr. Harl Bowie  Chief Complaint  Patient presents with  . Follow-up    Worsening edema and weight gain    History of Present Illness:    Dustin Cruz is a 54 y.o. male with past medical history of CAD (s/p BMS to RI in 2012, 70-80% ISR by repeat cath in 2013 with medical management recommended due to small vessel size), HTN, HLD, Type 2 DM and history of DVT (occurring in 03/2019) who presents to the office today for overdue follow-up and due to worsening lower extremity edema.  He was last examined by myself in 07/2019 and denied any recent chest pain or dyspnea on exertion at that time. He was planning to undergo surgery for circumcision and was cleared to proceed from a cardiac perspective. He had only been on Eliquis for 1 month following his prior DVT and follow-up dopplers were obtained and negative for a DVT. Eliquis was resumed and he was informed to follow-up with Hematology.   In the interm, he was admitted to [facility] in 06/2020 for an acute CHF exacerbation as he reported worsening dyspnea, orthopnea and edema. BNP was elevated to 1602. HS Troponin values were flat at 37 and 46. Creatinine was stable at 1.14 but glucose was > 500. He was started on IV Lasix but left AMA the morning after admission.   Was evaluated by his PCP on 10/12 and was noted to be significantly volume overloaded. Was noncompliant with his cardiac and diabetic medications at that time. His Lasix was increased to 51m daily and Cardiology follow-up was arranged. Repeat labs showed BNP remained significantly elevated at 1630. Creatinine was stable at 1.20 with Na+ low at 129. Glucose was elevated to 645 and he was referred back to Endocrinology.   In talking with the patient today, he  reports worsening dyspnea on exertion, orthopnea and lower extremity edema for the past several weeks. He says that he was compliant with Lasix 40 mg daily and increased this to 60 mg daily earlier this week as instructed but has not experienced improvement in his symptoms. He feels like he does not have significant urinary output. Reports associated abdominal distention and scrotal edema. Denies any exertional chest pain or palpitations.   Past Medical History:  Diagnosis Date  . Anxiety   . Asthma   . CKD stage 3 due to type 2 diabetes mellitus ([place]   . COPD (chronic obstructive pulmonary disease) ([place]   . Coronary artery disease    s/p BMS to Ramus 10/2010;  Cath 01/22/12 prox 30-40% LAD, LCx ramus w/ hazy 70-80% in-stent restenosis, EF 60% treated medically  . Coronary artery disease    s/p BMS to Ramus 10/2010;  Cath 01/22/12 prox 30-40% LAD, LCx ramus w/ hazy 70-80% in-stent restenosis, EF 60% treated medically   . Depression   . Diabetes mellitus    Type 2  . Gastroparesis 05/2013  . GERD (gastroesophageal reflux disease)   . HTN (hypertension)   . Hyperlipidemia   . Hyperlipidemia   . Major depression, chronic 08/13/2012  . [place]   [facility] [place]  . Myocardial infarction ([place] 2012  . Obesity   . Tobacco abuse   . Tubular adenoma   .  Urinary retention     Past Surgical History:  Procedure Laterality Date  . BIOPSY  01/07/2018   Procedure: BIOPSY;  Surgeon: Daneil Dolin, MD;  [facility];  Service: Endoscopy;;  duodenal and gastric biopsy  . BIOPSY  04/25/2018   Procedure: BIOPSY;  Surgeon: Daneil Dolin, MD;  Location: [facility];  Service: Endoscopy;;  ascending and descending and sigmoid biopsies  . CARDIAC CATHETERIZATION     with stent  . COLONOSCOPY N/A 01/27/2013   PTW:SFKCLE and colonic polyps. Tubular adenomas, poor bowel prep, one-year follow-up surveillance colonoscopy recommended  . COLONOSCOPY WITH  PROPOFOL N/A 04/25/2018   Procedure: COLONOSCOPY WITH PROPOFOL;  Surgeon: Daneil Dolin, MD;  Location: [facility];  Service: Endoscopy;  Laterality: N/A;  11:00am - pt to be prepped INPT and labs to be done as well  . CORONARY ANGIOPLASTY WITH STENT PLACEMENT    . ESOPHAGOGASTRODUODENOSCOPY  04/24/2012   [place]-mild erosive reflux esophagitis,dilated w/5F Venia Minks, small HH, minimal chronic gastric/bulbar erosions(No H pylori)  . ESOPHAGOGASTRODUODENOSCOPY (EGD) WITH PROPOFOL N/A 01/07/2018   Dr. Gala Romney: Erythematous mucosa in the stomach, retained gastric contents, incomplete exam.  Biopsied showed mild chronic gastritis but no H. pylori.  Duodenal biopsy was negative for celiac disease.  [name] FLEXIBLE SIGMOIDOSCOPY N/A 01/07/2018   Dr. Gala Romney: Incomplete colonoscopy due to inadequate bowel prep  . LEFT HEART CATHETERIZATION WITH CORONARY ANGIOGRAM N/A 01/22/2012   Procedure: LEFT HEART CATHETERIZATION WITH CORONARY ANGIOGRAM;  Surgeon: Jolaine Artist, MD;  Location: [facility] CATH LAB;  Service: Cardiovascular;  Laterality: N/A;  . melanoma surgery  2007   [place], removed lymph nodes under arm as well,Left abd    Current Medications: No facility-administered medications prior to visit.   Outpatient Medications Prior to Visit  Medication Sig Dispense Refill  . acetaminophen (TYLENOL) 325 MG tablet Take 2 tablets (650 mg total) by mouth every 6 (six) hours as needed for mild pain, moderate pain or fever (or Fever >/= 101). 30 tablet 2  . albuterol (PROVENTIL) (2.5 MG/3ML) 0.083% nebulizer solution Take 3 mLs (2.5 mg total) by nebulization every 6 (six) hours as needed for wheezing or shortness of breath. Dx---44.1 540 mL 0  . albuterol (VENTOLIN HFA) 108 (90 Base) MCG/ACT inhaler 2 puffs every 6 hours prn 18 g 3  . apixaban (ELIQUIS) 5 MG TABS tablet Take 1 tablet (5 mg total) by mouth 2 (two) times daily. 60 tablet 11  . aspirin EC 81 MG tablet Take 1 tablet (81 mg total) by mouth daily with breakfast.  30 tablet 5  . atorvastatin (LIPITOR) 80 MG tablet Take 1 tablet (80 mg total) by mouth daily. 90 tablet 2  . Blood Glucose Monitoring Suppl (ACCU-CHEK GUIDE) w/Device KIT 1 Piece by Does not apply route as directed. 1 kit 0  . busPIRone (BUSPAR) 7.5 MG tablet TAKE (1) TABLET TWICE DAILY. 60 tablet 2  . famotidine (PEPCID) 20 MG tablet Take 20 mg by mouth 2 (two) times daily.    . furosemide (LASIX) 40 MG tablet Take 1 tablet (40 mg total) by mouth daily. For swelling 30 tablet 4  . gabapentin (NEURONTIN) 600 MG tablet Take 1 tablet (600 mg total) by mouth 3 (three) times daily. 90 tablet 2  . glucose blood (ACCU-CHEK GUIDE) test strip Use as instructed 150 each 2  . insulin glargine (LANTUS SOLOSTAR) 100 UNIT/ML Solostar Pen Inject 60 Units into the skin every evening. 18 mL 2  . lisinopril (ZESTRIL) 10  MG tablet Take 1 tablet (10 mg total) by mouth at bedtime. 90 tablet 1  . metFORMIN (GLUCOPHAGE) 500 MG tablet Take 1 tablet (500 mg total) by mouth 2 (two) times daily with a meal. 180 tablet 1  . mometasone-formoterol (DULERA) 200-5 MCG/ACT AERO Inhale 2 puffs into the lungs in the morning and at bedtime. 1 each 12  . nitroGLYCERIN (NITROSTAT) 0.4 MG SL tablet Place 1 tablet (0.4 mg total) under the tongue every 5 (five) minutes as needed. For chest pain. 25 tablet 3  . traZODone (DESYREL) 150 MG tablet Take 1 tablet (150 mg total) by mouth at bedtime. 90 tablet 1     Allergies:   Patient has no known allergies.   Social History   Socioeconomic History  . Marital status: Divorced    Spouse name: rayann Lovins  . Number of children: 0  . Years of education: Not on file  . Highest education level: Not on file  Occupational History  . Occupation: disabled  Tobacco Use  . Smoking status: Current Every Day Smoker    Packs/day: 1.00    Years: 31.00    [place] years: 31.00    Types: Cigarettes    Start date: 10/09/1981  . Smokeless tobacco: Never Used  . Tobacco comment: 1/2 Persing daily  11/01/17  Vaping Use  . Vaping Use: Never used  Substance and Sexual Activity  . 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  . Drug use: No  . Sexual activity: Yes  Other Topics Concern  . Not on file  Social History Narrative   Lives at home in an apartment alone. He is divorced and does not have any children. He has two brothers but is estranged from one. He only talks with the other one infrequently. He does have contact with his ex wife and they have a good relationship. They help each other out.    Social Determinants of Health   Financial Resource Strain:   . Difficulty of Paying Living Expenses: Not on file  Food Insecurity:   . Worried About Charity fundraiser in the Last Year: Not on file  . Ran Out of Food in the Last Year: Not on file  Transportation Needs:   . Lack of Transportation (Medical): Not on file  . Lack of Transportation (Non-Medical): Not on file  Physical Activity:   . Days of Exercise per Week: Not on file  . Minutes of Exercise per Session: Not on file  Stress:   . Feeling of Stress : Not on file  Social Connections:   . Frequency of Communication with Friends and Family: Not on file  . Frequency of Social Gatherings with Friends and Family: Not on file  . Attends Religious Services: Not on file  . Active Member of Clubs or Organizations: Not on file  . Attends Archivist Meetings: Not on file  . Marital Status: Not on file     Family History:  The patient's family history includes Alcohol abuse in his father; Heart disease in an other family member; Lung cancer in an other family member; Other in an other family member; Stroke in his mother.   Review of Systems:   Please see the history of present illness.     General:  No chills, fever, night sweats or weight changes.  Cardiovascular:  No  chest pain, palpitations,  paroxysmal nocturnal dyspnea. Positive for dyspnea on exertion, edema and orthopnea.  Dermatological: No rash, lesions/masses Respiratory: No cough, Positive for dyspnea. Urologic: No hematuria, dysuria Abdominal:   No nausea, vomiting, diarrhea, bright red blood per rectum, melena, or hematemesis Neurologic:  No visual changes, wkns, changes in mental status. All other systems reviewed and are otherwise negative except as noted above.   Physical Exam:    VS:  BP 138/80   Pulse (!) 104   Ht '6\' 2"'  (1.88 m)   Wt 221 lb (100.2 kg)   SpO2 96%   BMI 28.37 kg/m    General: Well developed, well nourished,male appearing in no acute distress. Head: Normocephalic, atraumatic. Neck: No carotid bruits. JVD elevated to 9 cm.  Lungs: Respirations regular and unlabored, mild rales along bases bilaterally Heart: Regular rate and rhythm. No S3 or S4.  No murmur, no rubs, or gallops appreciated. Abdomen: No obvious abdominal masses. Appears distended.  Msk:  Strength and tone appear normal for age. No obvious joint deformities or effusions. Extremities: No clubbing or cyanosis. 2+ pitting edema bilaterally.  Distal pedal pulses are 2+ bilaterally. Neuro: Alert and oriented X 3. Moves all extremities spontaneously. No focal deficits noted. Psych:  Responds to questions appropriately with a normal affect. Skin: No rashes or lesions noted  Wt Readings from Last 3 Encounters:  07/23/20 222 lb (100.7 kg)  07/23/20 221 lb (100.2 kg)  07/20/20 220 lb 6.4 oz (100 kg)     Studies/Labs Reviewed:   EKG:  EKG is not ordered today.   Recent Labs: 06/25/2020: ALT 33 07/20/2020: Magnesium 1.8; TSH 1.060 07/23/2020: B Natriuretic Peptide 1,465.0; BUN 24; Creatinine, Ser 1.29; Hemoglobin 13.4; Platelets 341; Potassium 4.1; Sodium 126   Lipid Panel    Component Value Date/Time   CHOL 166 01/15/2017 1036   CHOL 277 (H) 03/19/2013 1131   TRIG 376 (H) 01/15/2017 1036   TRIG 292 (H) 03/19/2013 1131   HDL  39 (L) 01/15/2017 1036   HDL 44 03/19/2013 1131   CHOLHDL 4.3 01/15/2017 1036   CHOLHDL 3.7 05/07/2013 0955   VLDL 39 05/07/2013 0955   LDLCALC 52 01/15/2017 1036   LDLCALC 175 (H) 03/19/2013 1131    Additional studies/ records that were reviewed today include:   Echocardiogram: 03/2019 IMPRESSIONS    1. The left ventricle has normal systolic function with an ejection  fraction of 60-65%. The cavity size was normal. There is moderate  concentric left ventricular hypertrophy. Left ventricular diastolic  parameters were normal.  2. Septal dysynergy due to LBBB.  3. The right ventricle has normal systolic function. The cavity was  normal. There is no increase in right ventricular wall thickness.  4. The mitral valve is grossly normal.  5. The tricuspid valve is grossly normal.  6. The aortic valve was not well visualized.  7. The aortic root is normal in size and structure.  8. The inferior vena cava was dilated in size with >50% respiratory  variability.  9. No atrial level shunt detected by color flow Doppler. Agitated saline  contrast was given intravenously to evaluate for intracardiac shunting.  Saline contrast bubble study was negative, with no evidence of any  interatrial shunt.   Assessment:    1. Acute on chronic diastolic CHF (congestive heart failure) ([place])   2. Coronary artery disease involving native coronary artery of native heart without angina pectoris   3. Essential hypertension   4. Hyperlipidemia LDL goal <70  5. Uncontrolled type 2 diabetes mellitus with hyperglycemia ([place])   6. History of DVT (deep vein thrombosis)      Plan:   In order of problems listed above:  1. Acute on Chronic Diastolic CHF - He was hospitalized for an acute CHF exacerbation in 06/2020 but left AMA. Reports worsening dyspnea on exertion, orthopnea, lower extremity edema, mild abdominal bloating and scrotal edema since. He reports compliance with Lasix 40 mg daily and  this was titrated to 60 mg daily on Tuesday without improvement in his symptoms and his weight has increased by an additional 2 pounds since earlier this week. Reports a baseline of 180 - 190 lbs, now at 222 lbs on the office scales today.  - Reviewed options with the patient and admission for diuresis has been recommended. Reviewed with Dr. Harrington Challenger (DOD) as well. I informed him that he will likely be in the hospital for several days given his volume overload. Would recommend starting IV Lasix 40 mg twice daily initially and can titrate pending reassessment of renal function. Creatinine was stable at 1.20 on 07/20/2020. Would also obtain an updated echocardiogram for reassessment of his EF given his worsening symptoms and frequent PVC's by recent EKG.  2. CAD  - He is s/p BMS to RI in 2012 with 70-80% ISR by repeat cath in 2013 with medical management recommended due to small vessel size.  - He reports worsening dyspnea as outlined above but denies any recent exertional chest pain. Obtain a repeat echocardiogram as outlined above. - Continue ASA 81 mg daily and Atorvastatin 80 mg daily.   3. HTN - BP is well controlled at 138/80 during today's visit. Continue current medication regimen with Lisinopril 10 mg daily.    4. HLD - Would recheck FLP. Remains on Atorvastatin 24m daily with goal LDL less than 70 with known CAD.   5. IDDM - Hgb A1c > 14 when checked in 06/2020 and glucose was at 645 on 07/20/2020. Appreciate Hospitalist's assistance with management during admission.   6. History of DVT - He denies any evidence of active bleeding. Remains on Eliquis 532mBID for anticoagulation.    Medication Adjustments/Labs and Tests Ordered: Current medicines are reviewed at length with the patient today.  Concerns regarding medicines are outlined above.  Medication changes, Labs and Tests ordered today are listed in the Patient Instructions below.  Patient sent directly to the Emergency Department  from the office.   Signed, BrErma HeritagePA-C  07/23/2020 3:53 PM    [place]. Ma[address]e[place] [id number]: [phone]: [phone]

## 2020-07-23 NOTE — H&P (Signed)
History and Physical  [facility]  Dustin Cruz XAJ:[id number] DOB: 03/02/1966 DOA: 07/23/2020  PCP: Dustin Norlander, DO  Patient coming from: Home   I have personally briefly reviewed patient's old medical records in [place]  Chief Complaint: swollen legs   HPI: Dustin Cruz is a 54 y.o. male with medical history significant for coronary artery disease, diastolic heart failure, chronic tobacco abuse, uncontrolled type 2 diabetes mellitus with diabetic neuropathy, GERD, hypertension, COPD, stage IIIb CKD, dyslipidemia and poor compliance has been dealing with increasing edema in the legs for the past month.  He is having increasing shortness of breath as well.  He was recently hospitalized last month for diastolic CHF but discharged [place] after 1 night.  He has been working with his primary care provider who recently increased his Lasix to 60 mg daily but still having poor urine output.  He reports that over the past 1 month he has had increasing weight gain of up to 30 pounds.  He is having increasing edema in the feet up to the lower abdomen and scrotum.  He was referred to cardiology by his primary care provider and saw them in the office today.  They were very concerned about his weight gain and fluid overload and sent him to the emergency department for admission for diuresis.  The patient denies chest pain.  He is not caring for his diabetes at all with a hemoglobin A1c greater than 15.5 and blood sugar on arrival 645.  He has been referred to outpatient endocrinology for follow-up.  Patient is smoking heavily.  He is glucose was noted to be 129 potassium 4.1, BNP 1465, high-sensitivity troponin 75, WBC 8.8, hemoglobin 13.4, platelets 341.  His chest x-ray was significant for chronic changes but no acute findings.  He was given 60 mg IV Lasix in the emergency department and admission was requested.  Review of Systems: As per HPI otherwise 10 point review of  systems negative.    Past Medical History:  Diagnosis Date  . Anxiety   . Asthma   . CKD stage 3 due to type 2 diabetes mellitus ([place])   . COPD (chronic obstructive pulmonary disease) ([place])   . Coronary artery disease    s/p BMS to [place] 10/2010;  Cath 01/22/12 prox 30-40% LAD, LCx ramus w/ hazy 70-80% in-stent restenosis, EF 60% treated medically  . Coronary artery disease    s/p BMS to [facility] 10/2010;  Cath 01/22/12 prox 30-40% LAD, LCx ramus w/ hazy 70-80% in-stent restenosis, EF 60% treated medically   . Depression   . Diabetes mellitus    Type 2  . Gastroparesis 05/2013  . GERD (gastroesophageal reflux disease)   . HTN (hypertension)   . Hyperlipidemia   . Hyperlipidemia   . Major depression, chronic 08/13/2012  . Melanoma ([place]) 2007   surgery at [facility], Followed by [place]  . Myocardial infarction ([place]) 2012  . Obesity   . Tobacco abuse   . Tubular adenoma   . Urinary retention     Past Surgical History:  Procedure Laterality Date  . BIOPSY  01/07/2018   Procedure: BIOPSY;  Surgeon: Daneil Dolin, MD;  Location: [facility];  Service: Endoscopy;;  duodenal and gastric biopsy  . BIOPSY  04/25/2018   Procedure: BIOPSY;  Surgeon: Daneil Dolin, MD;  Location[facility];  Service: Endoscopy;;  ascending and descending and sigmoid biopsies  . CARDIAC CATHETERIZATION  with stent  . COLONOSCOPY N/A 01/27/2013   WYO:VZCHYI and colonic polyps. Tubular adenomas, poor bowel prep, one-year follow-up surveillance colonoscopy recommended  . COLONOSCOPY WITH PROPOFOL N/A 04/25/2018   Procedure: COLONOSCOPY WITH PROPOFOL;  Surgeon: Daneil Dolin, MD;  Location: [facility];  Service: Endoscopy;  Laterality: N/A;  11:00am - pt to be prepped INPT and labs to be done as well  . CORONARY ANGIOPLASTY WITH STENT PLACEMENT    . ESOPHAGOGASTRODUODENOSCOPY  04/24/2012   [place]-mild erosive reflux esophagitis,dilated w/42F Venia Minks, small HH, minimal chronic gastric/bulbar erosions(No H  pylori)  . ESOPHAGOGASTRODUODENOSCOPY (EGD) WITH PROPOFOL N/A 01/07/2018   Dr. Gala Romney: Erythematous mucosa in the stomach, retained gastric contents, incomplete exam.  Biopsied showed mild chronic gastritis but no H. pylori.  Duodenal biopsy was negative for celiac disease.  [name] FLEXIBLE SIGMOIDOSCOPY N/A 01/07/2018   Dr. Gala Romney: Incomplete colonoscopy due to inadequate bowel prep  . LEFT HEART CATHETERIZATION WITH CORONARY ANGIOGRAM N/A 01/22/2012   Procedure: LEFT HEART CATHETERIZATION WITH CORONARY ANGIOGRAM;  Surgeon: Jolaine Artist, MD;  Location: [facility] CATH LAB;  Service: Cardiovascular;  Laterality: N/A;  . melanoma surgery  2007   [place], removed lymph nodes under arm as well,Left abd     reports that he has been smoking cigarettes. He started smoking about 38 years ago. He has a 31.00 [place]-year smoking history. He has never used smokeless tobacco. He reports that he does not drink alcohol and does not use drugs.  No Known Allergies  Family History  Problem Relation Age of Onset  . Stroke Mother   . Alcohol abuse Father   . Heart disease Other   . Other Other        not real familiar with family history  . Lung cancer Other   . Colon cancer Neg Hx   . Colon polyps Neg Hx      Prior to Admission medications   Medication Sig Start Date End Date Taking? Authorizing Provider  acetaminophen (TYLENOL) 325 MG tablet Take 2 tablets (650 mg total) by mouth every 6 (six) hours as needed for mild pain, moderate pain or fever (or Fever >/= 101). 03/21/19  Yes Emokpae, Courage, MD  albuterol (PROVENTIL) (2.5 MG/3ML) 0.083% nebulizer solution Take 3 mLs (2.5 mg total) by nebulization every 6 (six) hours as needed for wheezing or shortness of breath. Dx---44.1 02/16/20  Yes Gottschalk, Leatrice Jewels M, DO  albuterol (VENTOLIN HFA) 108 (90 Base) MCG/ACT inhaler 2 puffs every 6 hours prn 07/01/20  Yes Gottschalk, Ashly M, DO  apixaban (ELIQUIS) 5 MG TABS tablet Take 1 tablet (5 mg total) by mouth 2 (two) times  daily. 07/16/19  Yes Strader, Fransisco Hertz, PA-C  aspirin EC 81 MG tablet Take 1 tablet (81 mg total) by mouth daily with breakfast. 03/21/19  Yes Emokpae, Courage, MD  atorvastatin (LIPITOR) 80 MG tablet Take 1 tablet (80 mg total) by mouth daily. 07/01/20  Yes Gottschalk, Ashly M, DO  busPIRone (BUSPAR) 7.5 MG tablet TAKE (1) TABLET TWICE DAILY. 07/01/20  Yes Gottschalk, Leatrice Jewels M, DO  famotidine (PEPCID) 20 MG tablet Take 20 mg by mouth 2 (two) times daily. 12/22/19  Yes [provider]  furosemide (LASIX) 40 MG tablet Take 1 tablet (40 mg total) by mouth daily. For swelling 07/01/20  Yes Gottschalk, Ashly M, DO  gabapentin (NEURONTIN) 600 MG tablet Take 1 tablet (600 mg total) by mouth 3 (three) times daily. 07/01/20  Yes Gottschalk, Leatrice Jewels M, DO  insulin glargine (LANTUS SOLOSTAR) 100  UNIT/ML Solostar Pen Inject 60 Units into the skin every evening. 07/01/20  Yes Gottschalk, Ashly M, DO  lisinopril (ZESTRIL) 10 MG tablet Take 1 tablet (10 mg total) by mouth at bedtime. 07/01/20  Yes Ronnie Doss M, DO  metFORMIN (GLUCOPHAGE) 500 MG tablet Take 1 tablet (500 mg total) by mouth 2 (two) times daily with a meal. 11/12/19  Yes Nida, Marella Chimes, MD  mometasone-formoterol (DULERA) 200-5 MCG/ACT AERO Inhale 2 puffs into the lungs in the morning and at bedtime. 07/20/20  Yes Gottschalk, Leatrice Jewels M, DO  nitroGLYCERIN (NITROSTAT) 0.4 MG SL tablet Place 1 tablet (0.4 mg total) under the tongue every 5 (five) minutes as needed. For chest pain. 08/13/12  Yes Reece Packer, NP  traZODone (DESYREL) 150 MG tablet Take 1 tablet (150 mg total) by mouth at bedtime. 02/03/20  Yes Gottschalk, Leatrice Jewels M, DO  Blood Glucose Monitoring Suppl (ACCU-CHEK GUIDE) w/Device KIT 1 Piece by Does not apply route as directed. 06/30/20   Claretta Fraise, MD  glucose blood (ACCU-CHEK GUIDE) test strip Use as instructed 07/01/20   Ronnie Doss M, DO  metoprolol tartrate (LOPRESSOR) 25 MG tablet Take 25 mg by mouth 2 (two) times  daily.    12/27/11  [provider]    Physical Exam: Vitals:   07/23/20 1318 07/23/20 1323 07/23/20 1330 07/23/20 1400  BP:   (!) 136/92 133/79  Pulse:   (!) 102 84  Resp:   19 20  SpO2:   95% 93%  Weight:  100.7 kg    Height: '6\' 2"'  (1.88 m)       Constitutional: Patient appears chronically ill and volume overloaded.  He has scrotal edema and pitting edema from the feet to the upper abdomen.  He appears much older than stated age. Eyes: PERRL, lids and conjunctivae normal.  ENMT: Mucous membranes are moist. Posterior pharynx clear of any exudate or lesions. poor dentition.  Neck: normal, supple, no masses, no thyromegaly Respiratory: Fine bibasilar crackles. Normal respiratory effort. No accessory muscle use.  Cardiovascular: Normal S1-S2 sounds, regular rate and rhythm, no murmurs / rubs / gallops. No extremity edema. 2+ pedal pulses. No carotid bruits.  Abdomen: no tenderness, no masses palpated. No hepatosplenomegaly.  Distended mildly with subcutaneous edema, bowel sounds positive.  Musculoskeletal: 2+ pitting edema bilateral lower extremities from feet up to the groin, insensate feet bilateral lower extremities, warm feet with weak pedal pulses, no clubbing / cyanosis. No joint deformity upper and lower extremities. Good ROM, no contractures. Normal muscle tone.  Skin: Yeast intertrigo in groin,  No induration.  Neurologic: CN 2-12 grossly intact. Sensation intact, DTR normal. Strength 5/5 in all 4.  Psychiatric: Normal judgment and insight. Alert and oriented x 3. Normal mood.   Labs on Admission: I have personally reviewed following labs and imaging studies  CBC: Recent Labs  Lab 07/20/20 1132 07/23/20 1344  WBC 6.7 8.8  NEUTROABS  --  6.6  HGB 13.7 13.4  HCT 42.3 39.9  MCV 98* 95.7  PLT 328 937   Basic Metabolic Panel: Recent Labs  Lab 07/20/20 1132 07/23/20 1344  NA 129* 126*  K 4.2 4.1  CL 90* 90*  CO2 28 26  GLUCOSE 645* 609*  BUN 13 24*    CREATININE 1.20 1.29*  CALCIUM 8.6* 8.4*  MG 1.8  --   PHOS 3.9  --    GFR: Estimated Creatinine Clearance: 82 mL/min (A) (by C-G formula based on SCr of 1.29 mg/dL (H)). Liver Function Tests:  Recent Labs  Lab 07/20/20 1132  ALBUMIN 2.9*   No results for input(s): LIPASE, AMYLASE in the last 168 hours. No results for input(s): AMMONIA in the last 168 hours. Coagulation Profile: No results for input(s): INR, PROTIME in the last 168 hours. Cardiac Enzymes: No results for input(s): CKTOTAL, CKMB, CKMBINDEX, TROPONINI in the last 168 hours. BNP (last 3 results) No results for input(s): PROBNP in the last 8760 hours. HbA1C: No results for input(s): HGBA1C in the last 72 hours. CBG: No results for input(s): GLUCAP in the last 168 hours. Lipid Profile: No results for input(s): CHOL, HDL, LDLCALC, TRIG, CHOLHDL, LDLDIRECT in the last 72 hours. Thyroid Function Tests: No results for input(s): TSH, T4TOTAL, FREET4, T3FREE, THYROIDAB in the last 72 hours. Anemia Panel: No results for input(s): VITAMINB12, FOLATE, FERRITIN, TIBC, IRON, RETICCTPCT in the last 72 hours. Urine analysis:    Component Value Date/Time   COLORURINE YELLOW 08/06/2018 1555   APPEARANCEUR Cloudy (A) 06/10/2019 1549   LABSPEC 1.022 08/06/2018 1555   PHURINE 5.0 08/06/2018 1555   GLUCOSEU 3+ (A) 06/10/2019 1549   HGBUR SMALL (A) 08/06/2018 1555   BILIRUBINUR Negative 06/10/2019 1549   KETONESUR NEGATIVE 08/06/2018 1555   PROTEINUR 2+ (A) 06/10/2019 1549   PROTEINUR 100 (A) 08/06/2018 1555   UROBILINOGEN 0.2 01/26/2014 1618   NITRITE Negative 06/10/2019 1549   NITRITE NEGATIVE 08/06/2018 1555   LEUKOCYTESUR Trace (A) 06/10/2019 1549    Radiological Exams on Admission: DG Chest Portable 1 View  Result Date: 07/23/2020 CLINICAL DATA:  Short of breath with lower extremity swelling for several days. History of COPD. EXAM: PORTABLE CHEST 1 VIEW COMPARISON:  06/29/2020. FINDINGS: The cardiac silhouette is  normal in size. No mediastinal or hilar masses. No evidence of adenopathy. Lungs are hyperexpanded. There are prominent bronchovascular markings with mild interstitial prominence latter most evident the lung bases. Interstitial thickening is decreased from the previous chest radiograph. No areas of lung consolidation. No convincing pleural effusion and no pneumothorax. Skeletal structures are grossly intact. IMPRESSION: 1. No acute cardiopulmonary disease. 2. Chronic lung changes with lung hyperexpansion consistent with COPD. Electronically Signed   By: Lajean Manes M.D.   On: 07/23/2020 13:51    Assessment/Plan Principal Problem:   Acute diastolic CHF (congestive heart failure) (HCC) Active Problems:   Arteriosclerotic cardiovascular disease (ASCVD)   Type 2 diabetes mellitus with stage 3a chronic kidney disease, with long-term current use of insulin (HCC)   GERD (gastroesophageal reflux disease)   Mixed hyperlipidemia   Hypertension associated with diabetes ([place])   Peripheral neuropathy   Frequency of urination   Hypertriglyceridemia   Diabetic retinopathy ([place])   Hyperglycemia   Microalbuminuria due to type 2 diabetes mellitus ([place])   Essential hypertension, benign   Acute on chronic diastolic CHF (congestive heart failure) (HCC)   Abnormal weight gain   Noncompliance    1. Acute heart failure-acute diastolic heart failure-patient is massively volume overloaded and will need IV diuresis with Lasix.  Continue 40 mg IV every 12 hours as renal function allows, monitor intake and output and daily weights closely, monitor electrolytes closely.  Obtain 2D echocardiogram to reassess.  Telemetry monitoring. 2. Essential hypertension-resume lisinopril 10 mg daily. 3. Uncontrolled type 2 diabetes mellitus with neurological complications-poorly controlled as evidenced by hemoglobin A1c greater than 15%.  Patient is noncompliant with any of his diabetic treatment regimen.  He will need intensive  counseling and outpatient referral to diabetes education center.  He has been referred to endocrinology  by his PCP.  He has been started on basal bolus insulin therapy.  Monitor CBG closely.  CBG 5 times per day and SSI coverage ordered. 4. Diabetic dyslipidemia-she has been resumed on home atorvastatin 80 mg daily.  Glycemic control is the key to improving lipids. 5. Coronary artery disease-resume home statin therapy and aspirin daily.  Negligible high-sensitivity troponin and no symptoms of chest pain. 6. Tobacco abuse-patient counseled on smoking cessation, nicotine patch 21 mg ordered for cravings. 7. Hyperglycemia-we will attempt to control with subcutaneous insulin initially as patient is in acute heart failure and trying to avoid giving excess volume fluid.  25 units of NovoLog ordered, basal bolus and SSI coverage ordered.  If this is ineffective would start IV insulin. 8. GERD-Protonix ordered for GI protection. 9. COPD-resume home bronchodilator therapy and nebulizer treatments ordered as well. 10. Noncompliance-patient remains high risk for leaving AMA due to past hospitalizations. 11. History of DVT - he remains on apixaban for full anticoagulation and follow-up with his PCP and hematologist arranged.  DVT prophylaxis: Apixaban Code Status: Full Family Communication:   Disposition Plan: Home Consults called: Heart care Admission status: INP  Mariena Meares MD [facility] How to contact the [facility] Attending or Consulting provider [place] or covering provider during after hours [place], for this patient?  1. Check the care team in [facility] and look for a) attending/consulting [facility] provider listed and b) the [facility] team listed 2. Log into [email or web address] and use [place]'s universal password to access. If you do not have the password, please contact the hospital operator. 3. Locate the [facility] provider you are looking for under [facility] and page to a number that you can be directly  reached. 4. If you still have difficulty reaching the provider, please page the [facility] (Director on Call) for the Hospitalists listed on amion for assistance.    If 7PM-7AM, please contact night-coverage [email or web address] Password [email or web address]  07/23/2020, 3:10 PM

## 2020-07-23 NOTE — ED Triage Notes (Signed)
Pt sent from Cardiology. History of diastolic HF. BNP 1600. Has gained 30 lbs in the last week. Cardiologist has increased Lasix. Pt now has scrotal swelling and third spacing.

## 2020-07-24 ENCOUNTER — Inpatient Hospital Stay (HOSPITAL_COMMUNITY): Payer: 59

## 2020-07-24 DIAGNOSIS — I5031 Acute diastolic (congestive) heart failure: Secondary | ICD-10-CM

## 2020-07-24 LAB — BASIC METABOLIC PANEL
Anion gap: 11 (ref 5–15)
BUN: 26 mg/dL — ABNORMAL HIGH (ref 6–20)
CO2: 30 mmol/L (ref 22–32)
Calcium: 8.5 mg/dL — ABNORMAL LOW (ref 8.9–10.3)
Chloride: 94 mmol/L — ABNORMAL LOW (ref 98–111)
Creatinine, Ser: 1.4 mg/dL — ABNORMAL HIGH (ref 0.61–1.24)
GFR, Estimated: 56 mL/min — ABNORMAL LOW (ref >60)
Glucose, Bld: 91 mg/dL (ref 70–99)
Potassium: 3.6 mmol/L (ref 3.5–5.1)
Sodium: 135 mmol/L (ref 135–145)

## 2020-07-24 LAB — LIPID PANEL
Cholesterol: 236 mg/dL — ABNORMAL HIGH (ref 0–200)
HDL: 80 mg/dL (ref >40)
LDL Cholesterol: 143 mg/dL — ABNORMAL HIGH (ref 0–99)
Total CHOL/HDL Ratio: 3 RATIO
Triglycerides: 67 mg/dL (ref <150)
VLDL: 13 mg/dL (ref 0–40)

## 2020-07-24 LAB — ECHOCARDIOGRAM COMPLETE
Area-P 1/2: 3.85 cm2
Height: 74 in
S' Lateral: 2.78 cm
Weight: 3552 oz

## 2020-07-24 LAB — MAGNESIUM: Magnesium: 2 mg/dL (ref 1.7–2.4)

## 2020-07-24 LAB — CBG MONITORING, ED
Glucose-Capillary: 80 mg/dL (ref 70–99)
Glucose-Capillary: 230 mg/dL — ABNORMAL HIGH (ref 70–99)

## 2020-07-24 LAB — RAPID URINE DRUG SCREEN, HOSP PERFORMED
Amphetamines: NOT DETECTED
Barbiturates: NOT DETECTED
Benzodiazepines: NOT DETECTED
Cocaine: NOT DETECTED
Opiates: NOT DETECTED
Tetrahydrocannabinol: NOT DETECTED

## 2020-07-24 LAB — BRAIN NATRIURETIC PEPTIDE: B Natriuretic Peptide: 1220 pg/mL — ABNORMAL HIGH (ref 0.0–100.0)

## 2020-07-24 MED ORDER — VITAMIN D (ERGOCALCIFEROL) 1.25 MG (50000 UNIT) PO CAPS
50000.0000 [IU] | ORAL_CAPSULE | ORAL | Status: DC
  Filled 2020-07-24: qty 1

## 2020-07-24 MED ORDER — INSULIN GLARGINE 100 UNIT/ML ~~LOC~~ SOLN
45.0000 [IU] | Freq: Every day | SUBCUTANEOUS | Status: DC
  Filled 2020-07-24 (×2): qty 0.45

## 2020-07-24 MED ORDER — GABAPENTIN 300 MG PO CAPS
300.0000 mg | ORAL_CAPSULE | Freq: Three times a day (TID) | ORAL | Status: DC | PRN
  Administered 2020-07-24: 300 mg via ORAL
  Filled 2020-07-24: qty 1

## 2020-07-24 MED ORDER — INSULIN ASPART 100 UNIT/ML ~~LOC~~ SOLN
12.0000 [IU] | Freq: Three times a day (TID) | SUBCUTANEOUS | Status: DC
Start: 2020-07-24 — End: 2020-07-24

## 2020-07-24 NOTE — Progress Notes (Signed)
PROGRESS NOTE   Dustin Cruz  URK:[id number] DOB: Apr 22, 1966 DOA: 07/23/2020 PCP: [name], DO   Chief Complaint  Patient presents with  . Congestive Heart Failure    Brief Admission History:  54 y.o. male with medical history significant for coronary artery disease, diastolic heart failure, chronic tobacco abuse, uncontrolled type 2 diabetes mellitus with diabetic neuropathy, GERD, hypertension, COPD, stage IIIb CKD, dyslipidemia and poor compliance has been dealing with increasing edema in the legs for the past month.  He is having increasing shortness of breath as well.  He was massively volume overloaded and admitted for acute diastolic heart failure.   Assessment & Plan:   Principal Problem:   Acute diastolic CHF (congestive heart failure) (HCC) Active Problems:   Arteriosclerotic cardiovascular disease (ASCVD)   Type 2 diabetes mellitus with stage 3a chronic kidney disease, with long-term current use of insulin (HCC)   GERD (gastroesophageal reflux disease)   Mixed hyperlipidemia   Hypertension associated with diabetes ([place])   Peripheral neuropathy   Frequency of urination   Hypertriglyceridemia   Diabetic retinopathy (HCC)   Hyperglycemia   Microalbuminuria due to type 2 diabetes mellitus ([place])   Essential hypertension, benign   Acute on chronic diastolic CHF (congestive heart failure) (HCC)   Abnormal weight gain   Noncompliance   1. Acute heart failure-acute diastolic heart failure-patient remains massively volume overloaded and will need ongoing IV diuresis with Lasix.  Continue 40 mg IV every 12 hours as renal function allows, monitor intake and output and daily weights closely, monitor electrolytes closely.  Obtain 2D echocardiogram to reassess.  Telemetry monitoring. 2. Essential hypertension-temporarily held lisinopril due to soft BPs and AKI.  3. Uncontrolled type 2 diabetes mellitus with neurological complications-poorly controlled as evidenced by  hemoglobin A1c greater than 15%.  Patient is noncompliant with any of his diabetic treatment regimen.  He will need intensive counseling and outpatient referral to diabetes education center.  He has been referred to endocrinology by his PCP.  He has been started on basal bolus insulin therapy.  Monitor CBG closely.  CBG 5 times per day and SSI coverage ordered.  Pt is HIGH RISK for acute and chronic complications of poorly controlled diabetes mellitus.  4. AKI on stage 3b CKD - follow creatinine closely with IV lasix diuresis.   5. Diabetic dyslipidemia-resumed on home atorvastatin 80 mg daily.  Glycemic control is the key to improving lipids. 6. Coronary artery disease-continue home statin therapy and aspirin daily. 7. Tobacco abuse-patient counseled on smoking cessation, nicotine patch 21 mg ordered for cravings. 8. Hyperglycemia-Improving. 9. GERD-Protonix ordered for GI protection. 10. COPD-resume home bronchodilator therapy and nebulizer treatments ordered as well. 11. Noncompliance-patient remains high risk for leaving AMA due to past hospitalizations. 12. History of DVT - he remains on apixaban for full anticoagulation and follow-up with his PCP and hematologist arranged.  DVT prophylaxis: Apixaban Code Status: Full Family Communication:   Disposition Plan: Home Consults called: Heart care Admission status: INP Status is: Inpatient  Remains inpatient appropriate because:IV treatments appropriate due to intensity of illness or inability to take PO and Inpatient level of care appropriate due to severity of illness  Dispo:  Patient From: Home  Planned Disposition: Home  Expected discharge date: 07/27/20  Medically stable for discharge: No  Consultants:   Cardiology   Procedures:     Antimicrobials:     Subjective: Pt reports that he is urinating well on IV lasix.  He continues to have swollen legs  and thighs and his scrotum remains swollen.   Objective: Vitals:    07/24/20 0800 07/24/20 0830 07/24/20 0845 07/24/20 0900  BP: 124/74 130/80  132/78  Pulse:      Resp: (!) 26 12 12 15   SpO2:      Weight:      Height:        Intake/Output Summary (Last 24 hours) at 07/24/2020 1053 Last data filed at 07/24/2020 0616 Gross per 24 hour  Intake --  Output 3100 ml  Net -3100 ml   Filed Weights   07/23/20 1323  Weight: 100.7 kg   Examination:  General exam: Appears calm and comfortable  Respiratory system: bibasilar crackles. Respiratory effort normal. Cardiovascular system: S1 & S2 heard, RRR. No JVD, murmurs, rubs, gallops or clicks. No pedal edema. Gastrointestinal system: Abdomen is nondistended, soft and nontender. No organomegaly or masses felt. Normal bowel sounds heard. Central nervous system: Alert and oriented. No focal neurological deficits. Extremities: 2+ pitting edema BLEs feet up to scrotum.  Symmetric 5 x 5 power. Skin: yeast intertrigo.  Psychiatry: Judgement and insight appear normal. Mood & affect appropriate.   Data Reviewed: I have personally reviewed following labs and imaging studies  CBC: Recent Labs  Lab 07/20/20 1132 07/23/20 1344  WBC 6.7 8.8  NEUTROABS  --  6.6  HGB 13.7 13.4  HCT 42.3 39.9  MCV 98* 95.7  PLT 328 478    Basic Metabolic Panel: Recent Labs  Lab 07/20/20 1132 07/23/20 1344 07/24/20 0413  NA 129* 126* 135  K 4.2 4.1 3.6  CL 90* 90* 94*  CO2 28 26 30   GLUCOSE 645* 609* 91  BUN 13 24* 26*  CREATININE 1.20 1.29* 1.40*  CALCIUM 8.6* 8.4* 8.5*  MG 1.8  --  2.0  PHOS 3.9  --   --     GFR: Estimated Creatinine Clearance: 75.6 mL/min (A) (by C-G formula based on SCr of 1.4 mg/dL (H)).  Liver Function Tests: Recent Labs  Lab 07/20/20 1132  ALBUMIN 2.9*    CBG: Recent Labs  Lab 07/23/20 1715 07/23/20 1937 07/23/20 2309 07/24/20 0722  GLUCAP 356* 129* 102* 80    Recent Results (from the past 240 hour(s))  Respiratory Panel by RT PCR (Flu A&B, Covid) - Nasopharyngeal Swab      Status: None   Collection Time: 07/23/20  1:25 PM   Specimen: Nasopharyngeal Swab  Result Value Ref Range Status   SARS Coronavirus 2 by RT PCR NEGATIVE NEGATIVE Final    Comment: (NOTE) SARS-CoV-2 target nucleic acids are NOT DETECTED.  The SARS-CoV-2 RNA is generally detectable in upper respiratoy specimens during the acute phase of infection. The lowest concentration of SARS-CoV-2 viral copies this assay can detect is 131 copies/mL. A negative result does not preclude SARS-Cov-2 infection and should not be used as the sole basis for treatment or other patient management decisions. A negative result may occur with  improper specimen collection/handling, submission of specimen other than nasopharyngeal swab, presence of viral mutation(s) within the areas targeted by this assay, and inadequate number of viral copies (<131 copies/mL). A negative result must be combined with clinical observations, patient history, and epidemiological information. The expected result is Negative.  Fact Sheet for Patients:  PinkCheek.be  Fact Sheet for Healthcare Providers:  GravelBags.it  This test is no t yet approved or cleared by the [place] FDA and  has been authorized for detection and/or diagnosis of SARS-CoV-2 by FDA under an Emergency Use Authorization (EUA).  This EUA will remain  in effect (meaning this test can be used) for the duration of the COVID-19 declaration under Section 564(b)(1) of the Act, 21 U.S.C. section 360bbb-3(b)(1), unless the authorization is terminated or revoked sooner.     Influenza A by PCR NEGATIVE NEGATIVE Final   Influenza B by PCR NEGATIVE NEGATIVE Final    Comment: (NOTE) The Xpert Xpress SARS-CoV-2/FLU/RSV assay is intended as an aid in  the diagnosis of influenza from Nasopharyngeal swab specimens and  should not be used as a sole basis for treatment. Nasal washings and  aspirates are  unacceptable for Xpert Xpress SARS-CoV-2/FLU/RSV  testing.  Fact Sheet for Patients: PinkCheek.be  Fact Sheet for Healthcare Providers: GravelBags.it  This test is not yet approved or cleared by the [place] FDA and  has been authorized for detection and/or diagnosis of SARS-CoV-2 by  FDA under an Emergency Use Authorization (EUA). This EUA will remain  in effect (meaning this test can be used) for the duration of the  Covid-19 declaration under Section 564(b)(1) of the Act, 21  U.S.C. section 360bbb-3(b)(1), unless the authorization is  terminated or revoked. Performed at [facility], [address], [place], [place] [address]      Radiology Studies: DG Chest Portable 1 View  Result Date: 07/23/2020 CLINICAL DATA:  Short of breath with lower extremity swelling for several days. History of COPD. EXAM: PORTABLE CHEST 1 VIEW COMPARISON:  06/29/2020. FINDINGS: The cardiac silhouette is normal in size. No mediastinal or hilar masses. No evidence of adenopathy. Lungs are hyperexpanded. There are prominent bronchovascular markings with mild interstitial prominence latter most evident the lung bases. Interstitial thickening is decreased from the previous chest radiograph. No areas of lung consolidation. No convincing pleural effusion and no pneumothorax. Skeletal structures are grossly intact. IMPRESSION: 1. No acute cardiopulmonary disease. 2. Chronic lung changes with lung hyperexpansion consistent with COPD. Electronically Signed   By: Lajean Manes M.D.   On: 07/23/2020 13:51   Scheduled Meds: . apixaban  5 mg Oral BID  . aspirin EC  81 mg Oral Q breakfast  . atorvastatin  80 mg Oral QPM  . busPIRone  7.5 mg Oral BID  . furosemide  40 mg Intravenous Q12H  . insulin aspart  0-20 Units Subcutaneous TID WC  . insulin aspart  0-5 Units Subcutaneous QHS  . insulin aspart  12 Units Subcutaneous TID WC  . insulin glargine  45  Units Subcutaneous QHS  . mometasone-formoterol  2 puff Inhalation BID  . nicotine  21 mg Transdermal Daily  . potassium chloride  30 mEq Oral BID  . sodium chloride flush  3 mL Intravenous Q12H  . traZODone  150 mg Oral QHS  . Vitamin D (Ergocalciferol)  50,000 Units Oral Q7 days   Continuous Infusions: . sodium chloride       LOS: 1 day   Time spent: 34 mins   Daisee Centner Wynetta Emery, MD How to contact the [facility] Attending or Consulting provider [place] or covering provider during after hours [place], for this patient?  1. Check the care team in [facility] and look for a) attending/consulting TRH provider listed and b) the [facility] team listed 2. Log into [email or web address] and use [place]'s universal password to access. If you do not have the password, please contact the hospital operator. 3. Locate the [facility] provider you are looking for under [facility] and page to a number that you can be directly reached. 4. If you still have  difficulty reaching the provider, please page the [facility] (Director on Call) for the Hospitalists listed on amion for assistance.  07/24/2020, 10:53 AM

## 2020-07-24 NOTE — Progress Notes (Signed)
*  PRELIMINARY RESULTS* Echocardiogram 2D Echocardiogram has been performed.  Leavy Cella 07/24/2020, 11:08 AM

## 2020-07-24 NOTE — ED Notes (Addendum)
Entered room and introduced self to patient. This RN explained role in plan of care at this time. Found patient to be sitting on edge of bed, stating "I'm sick of this. I'm ready to go home. I haven't been able to walk or move. I feel like I'm suffocating. No body comes when I need them. I'll just to go another hospital where they can pull the fluid off faster and I don't have to stay in the hospital." This RN educated on plan of care and the disease process and current treatment options. At patient request, pt and this RN up and ambulatory in hallways as tolerated. Pt has an even and steady gait on ambulation.   On arrival back to room, this RN provided a linen change at this time. Educated on hourly rounding and verbalized understanding at this time. Pt in agreement with admission and plan of care at this time.

## 2020-07-24 NOTE — Discharge Summary (Addendum)
Discharge Summary  07/24/2020 2:23 PM  Patient discharged against medical advice   Pt is once again discharging against medical advice.  He requires more days in the hospital for IV diuresis and glycemic control and I explained this to him in detail and he says he will not stay any longer.  He has diuresed about 3L of fluid but remains very volume overloaded.  His blood sugars are better.   Unfortunately he could not be convinced to stay.  He is HIGH RISK for acute and chronic complications of poorly controlled diabetes mellitus and heart failure. This was explained to him and he verbalized understanding. I asked him to follow up with cardiology outpatient. Follow up with his PCP who has been managing his oral lasix.  He was also referred by his PCP to [facility] for diabetes management.     Principal Problem:   Acute diastolic CHF (congestive heart failure) (HCC) Active Problems:   Arteriosclerotic cardiovascular disease (ASCVD)   Type 2 diabetes mellitus with stage 3a chronic kidney disease, with long-term current use of insulin (HCC)   GERD (gastroesophageal reflux disease)   Mixed hyperlipidemia   Hypertension associated with diabetes ([place])   Peripheral neuropathy   Frequency of urination   Hypertriglyceridemia   Diabetic retinopathy ([place])   Hyperglycemia   Microalbuminuria due to type 2 diabetes mellitus ([place])   Essential hypertension, benign   Acute on chronic diastolic CHF (congestive heart failure) ([place])   Abnormal weight gain   Noncompliance   Murvin Natal MD  How to contact the [facility] Attending or Consulting provider 7A - 7P or covering provider during after hours 7P -7A, for this patient?  1. Check the care team in [facility] and look for a) attending/consulting TRH provider listed and b) the [facility] team listed 2. Log into [email or web address] and use [place]'s universal password to access. If you do not have the password, please contact the hospital operator. 3. Locate the [facility] provider you  are looking for under [facility] and page to a number that you can be directly reached. 4. If you still have difficulty reaching the provider, please page the [facility] (Director on Call) for the Hospitalists listed on amion for assistance.

## 2020-07-24 NOTE — ED Notes (Signed)
This RN rounded on patient, pt requesting to ambulate hallways. This RN and pt up and ambulatory with an even and steady gait.  Pt ambulated back to room at this time, provided with a recliner for comfort per request, educated on lunch hours and is in agreement with care at this time.

## 2020-07-24 NOTE — ED Notes (Signed)
This RN to bedside after being notified that nurse aid that patient is requesting to leave. On arrival to room, found patient to have removed cardiac monitor and getting dressed. Pt states to this RN "I just can't do this anymore. I can't be in the hospital. I feel like I can't breath." This RN educated on plan of care and that patient has not been discharge and will require to leave AMA. Pt states "That's fine. I can't be here anymore." Pt is understanding of the risks and complications at this time. Dr. Wynetta Emery notified at this time.  AMA form signed by patient and witnessed per policy.

## 2020-07-24 NOTE — ED Notes (Addendum)
Pt provided with lunch tray and drink at this time. At this time, patient tolerating PO intake without distress.

## 2020-08-10 ENCOUNTER — Other Ambulatory Visit: Payer: Self-pay | Admitting: Family Medicine

## 2020-08-10 DIAGNOSIS — R590 Localized enlarged lymph nodes: Secondary | ICD-10-CM

## 2020-08-13 ENCOUNTER — Other Ambulatory Visit: Payer: Self-pay

## 2020-08-13 ENCOUNTER — Other Ambulatory Visit: Payer: 59

## 2020-08-17 ENCOUNTER — Ambulatory Visit: Payer: 59 | Admitting: Family Medicine

## 2020-08-19 ENCOUNTER — Encounter: Payer: Self-pay | Admitting: Family Medicine

## 2020-08-24 ENCOUNTER — Telehealth: Payer: Self-pay

## 2020-08-24 NOTE — Telephone Encounter (Signed)
Spoke with patient, appointment scheduled for follow up with Dr. Lajuana Ripple on 09/07/20.  Patient is currently living in a hotel sponsored by [id number] and looking for housing.

## 2020-08-31 ENCOUNTER — Other Ambulatory Visit: Payer: Self-pay | Admitting: Family Medicine

## 2020-09-01 ENCOUNTER — Encounter (HOSPITAL_COMMUNITY): Payer: Self-pay | Admitting: Emergency Medicine

## 2020-09-01 ENCOUNTER — Other Ambulatory Visit: Payer: Self-pay

## 2020-09-01 ENCOUNTER — Emergency Department (HOSPITAL_COMMUNITY)
Admission: EM | Admit: 2020-09-01 | Discharge: 2020-09-01 | Disposition: A | Discharge status: Home / Self Care | Payer: 59 | Attending: Emergency Medicine | Admitting: Emergency Medicine

## 2020-09-01 DIAGNOSIS — R609 Edema, unspecified: Secondary | ICD-10-CM | POA: Insufficient documentation

## 2020-09-01 DIAGNOSIS — E1129 Type 2 diabetes mellitus with other diabetic kidney complication: Secondary | ICD-10-CM | POA: Diagnosis not present

## 2020-09-01 DIAGNOSIS — I252 Old myocardial infarction: Secondary | ICD-10-CM | POA: Insufficient documentation

## 2020-09-01 DIAGNOSIS — Z7984 Long term (current) use of oral hypoglycemic drugs: Secondary | ICD-10-CM | POA: Diagnosis not present

## 2020-09-01 DIAGNOSIS — M62831 Muscle spasm of calf: Secondary | ICD-10-CM | POA: Insufficient documentation

## 2020-09-01 DIAGNOSIS — M79662 Pain in left lower leg: Secondary | ICD-10-CM | POA: Insufficient documentation

## 2020-09-01 DIAGNOSIS — E1165 Type 2 diabetes mellitus with hyperglycemia: Secondary | ICD-10-CM | POA: Insufficient documentation

## 2020-09-01 DIAGNOSIS — N183 Chronic kidney disease, stage 3 unspecified: Secondary | ICD-10-CM | POA: Insufficient documentation

## 2020-09-01 DIAGNOSIS — I130 Hypertensive heart and chronic kidney disease with heart failure and stage 1 through stage 4 chronic kidney disease, or unspecified chronic kidney disease: Secondary | ICD-10-CM | POA: Diagnosis not present

## 2020-09-01 DIAGNOSIS — E1143 Type 2 diabetes mellitus with diabetic autonomic (poly)neuropathy: Secondary | ICD-10-CM | POA: Diagnosis not present

## 2020-09-01 DIAGNOSIS — Z955 Presence of coronary angioplasty implant and graft: Secondary | ICD-10-CM | POA: Insufficient documentation

## 2020-09-01 DIAGNOSIS — Z794 Long term (current) use of insulin: Secondary | ICD-10-CM | POA: Insufficient documentation

## 2020-09-01 DIAGNOSIS — R739 Hyperglycemia, unspecified: Secondary | ICD-10-CM

## 2020-09-01 DIAGNOSIS — F17210 Nicotine dependence, cigarettes, uncomplicated: Secondary | ICD-10-CM | POA: Diagnosis not present

## 2020-09-01 DIAGNOSIS — R600 Localized edema: Secondary | ICD-10-CM

## 2020-09-01 DIAGNOSIS — E1122 Type 2 diabetes mellitus with diabetic chronic kidney disease: Secondary | ICD-10-CM | POA: Diagnosis not present

## 2020-09-01 DIAGNOSIS — Z79899 Other long term (current) drug therapy: Secondary | ICD-10-CM | POA: Diagnosis not present

## 2020-09-01 DIAGNOSIS — J449 Chronic obstructive pulmonary disease, unspecified: Secondary | ICD-10-CM | POA: Insufficient documentation

## 2020-09-01 DIAGNOSIS — I2510 Atherosclerotic heart disease of native coronary artery without angina pectoris: Secondary | ICD-10-CM | POA: Diagnosis not present

## 2020-09-01 DIAGNOSIS — I5031 Acute diastolic (congestive) heart failure: Secondary | ICD-10-CM | POA: Insufficient documentation

## 2020-09-01 DIAGNOSIS — M79661 Pain in right lower leg: Secondary | ICD-10-CM | POA: Diagnosis present

## 2020-09-01 DIAGNOSIS — M62838 Other muscle spasm: Secondary | ICD-10-CM

## 2020-09-01 DIAGNOSIS — Z85820 Personal history of malignant melanoma of skin: Secondary | ICD-10-CM | POA: Diagnosis not present

## 2020-09-01 LAB — COMPREHENSIVE METABOLIC PANEL
ALT: 33 U/L (ref 0–44)
AST: 28 U/L (ref 15–41)
Albumin: 2.6 g/dL — ABNORMAL LOW (ref 3.5–5.0)
Alkaline Phosphatase: 155 U/L — ABNORMAL HIGH (ref 38–126)
Anion gap: 9 (ref 5–15)
BUN: 20 mg/dL (ref 6–20)
CO2: 24 mmol/L (ref 22–32)
Calcium: 8.5 mg/dL — ABNORMAL LOW (ref 8.9–10.3)
Chloride: 93 mmol/L — ABNORMAL LOW (ref 98–111)
Creatinine, Ser: 1.42 mg/dL — ABNORMAL HIGH (ref 0.61–1.24)
GFR, Estimated: 58 mL/min — ABNORMAL LOW (ref >60)
Glucose, Bld: 587 mg/dL (ref 70–99)
Potassium: 4.5 mmol/L (ref 3.5–5.1)
Sodium: 126 mmol/L — ABNORMAL LOW (ref 135–145)
Total Bilirubin: 0.5 mg/dL (ref 0.3–1.2)
Total Protein: 6 g/dL — ABNORMAL LOW (ref 6.5–8.1)

## 2020-09-01 LAB — CBC WITH DIFFERENTIAL/PLATELET
Abs Immature Granulocytes: 0.03 10*3/uL (ref 0.00–0.07)
Basophils Absolute: 0.1 10*3/uL (ref 0.0–0.1)
Basophils Relative: 1 %
Eosinophils Absolute: 0.1 10*3/uL (ref 0.0–0.5)
Eosinophils Relative: 1 %
HCT: 39.6 % (ref 39.0–52.0)
Hemoglobin: 12.7 g/dL — ABNORMAL LOW (ref 13.0–17.0)
Immature Granulocytes: 0 %
Lymphocytes Relative: 21 %
Lymphs Abs: 1.5 10*3/uL (ref 0.7–4.0)
MCH: 31.5 pg (ref 26.0–34.0)
MCHC: 32.1 g/dL (ref 30.0–36.0)
MCV: 98.3 fL (ref 80.0–100.0)
Monocytes Absolute: 0.8 10*3/uL (ref 0.1–1.0)
Monocytes Relative: 11 %
Neutro Abs: 4.6 10*3/uL (ref 1.7–7.7)
Neutrophils Relative %: 66 %
Platelets: 300 10*3/uL (ref 150–400)
RBC: 4.03 MIL/uL — ABNORMAL LOW (ref 4.22–5.81)
RDW: 14.1 % (ref 11.5–15.5)
WBC: 7.2 10*3/uL (ref 4.0–10.5)
nRBC: 0.7 % — ABNORMAL HIGH (ref 0.0–0.2)

## 2020-09-01 MED ORDER — METHOCARBAMOL 500 MG PO TABS
500.0000 mg | ORAL_TABLET | Freq: Once | ORAL | Status: AC
  Administered 2020-09-01: 500 mg via ORAL
  Filled 2020-09-01: qty 1

## 2020-09-01 MED ORDER — INSULIN ASPART 100 UNIT/ML ~~LOC~~ SOLN: 10.0000 [IU] | Freq: Once | SUBCUTANEOUS | Status: DC

## 2020-09-01 MED ORDER — FUROSEMIDE 40 MG PO TABS: 40.0000 mg | ORAL_TABLET | Freq: Two times a day (BID) | ORAL | 0 refills | Status: DC

## 2020-09-01 NOTE — ED Notes (Signed)
Date and time results received: 09/01/20 0443 (use smartphrase ".now" to insert current time)  Test: Glucose Critical Value: 587  Name of Provider Notified: [place]  Orders Received? Or Actions Taken?:

## 2020-09-01 NOTE — ED Triage Notes (Signed)
Pt c/o bilateral leg pain and swelling. Pt states he has been out of his lasix for 2 days due to no refills.

## 2020-09-01 NOTE — ED Notes (Signed)
Pt refused his insulin. When asked why, pt stated, "I'm ready to go home. I have insulin at home and I can just take it when I get home." I informed pt that we would give it to him while he was here but he refused. Dr. Christy Gentles aware.

## 2020-09-01 NOTE — ED Provider Notes (Signed)
[facility] EMERGENCY DEPARTMENT Provider Note   CSN: [id number] Arrival date & time: 09/01/20  4034     History Chief Complaint  Patient presents with  . Leg Pain    Dustin Cruz is a 54 y.o. male.  The history is provided by the patient.  Leg Pain Location:  Leg Leg location:  L lower leg and R lower leg Pain details:    Quality:  Aching   Severity:  Moderate   Onset quality:  Gradual   Timing:  Constant   Progression:  Worsening Chronicity:  New Relieved by: rest. Exacerbated by: movement. Associated symptoms: back pain   Associated symptoms: no fever   Patient with history of CAD, COPD, chronic kidney disease, diabetes, CHF presents with lower extremity pain or edema.  Patient reports he ran out of his Lasix over 2 days ago.  He has noticed increasing swelling to his legs and pain.  He reports the leg pain now radiates into his back.  No fevers or vomiting.  No chest pain.  He reports chronic shortness of breath that is unchanged.  No new orthopnea.  He reports spasms in his back and is requesting a "shot" No new leg weakness.  He is ambulatory with a cane.  No incontinence    Past Medical History:  Diagnosis Date  . Anxiety   . Asthma   . CKD stage 3 due to type 2 diabetes mellitus ([place])   . COPD (chronic obstructive pulmonary disease) ([place])   . Coronary artery disease    s/p BMS to Ramus 10/2010;  Cath 01/22/12 prox 30-40% LAD, LCx ramus w/ hazy 70-80% in-stent restenosis, EF 60% treated medically  . Coronary artery disease    s/p BMS to Ramus 10/2010;  Cath 01/22/12 prox 30-40% LAD, LCx ramus w/ hazy 70-80% in-stent restenosis, EF 60% treated medically   . Depression   . Diabetes mellitus    Type 2  . Gastroparesis 05/2013  . GERD (gastroesophageal reflux disease)   . HTN (hypertension)   . Hyperlipidemia   . Hyperlipidemia   . Major depression, chronic 08/13/2012  . Melanoma ([place]) 2007   surgery at [facility], Followed by [place]  . Myocardial infarction  ([place]) 2012  . Obesity   . Tobacco abuse   . Tubular adenoma   . Urinary retention     Patient Active Problem List   Diagnosis Date Noted  . Acute diastolic CHF (congestive heart failure) ([place]) 07/23/2020  . Abnormal weight gain 07/23/2020  . Noncompliance 07/23/2020  . Pelvic lymphadenopathy 07/20/2020  . Acute on chronic diastolic CHF (congestive heart failure) ([place]) 06/30/2020  . CHF (congestive heart failure) ([place]) 06/29/2020  . Pre-ulcerative calluses 10/29/2019  . Musculoskeletal pain 10/13/2019  . Housing problems 05/23/2019  . Essential hypertension, benign 03/20/2019  . 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 Near syncope 03/17/2019  . AKI (acute kidney injury) ([place]) 03/17/2019  . 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 CKD stage 3 due to type 2 diabetes mellitus ([place]) 11/28/2018  . Microalbuminuria due to type 2 diabetes mellitus ([place]) 05/22/2018  . Hyperglycemia 04/24/2018  . Abdominal pain, epigastric 11/29/2017  . Diarrhea 11/29/2017  . History of colonic polyps 11/29/2017  . Diffuse abdominal pain 11/29/2017  . Diabetic retinopathy ([place]) 09/21/2015  . Carotid bruit 09/21/2015  . Viral gastroenteritis 06/03/2015  . Hypertriglyceridemia 05/18/2015  . Vitamin D deficiency 12/29/2013  . Nausea alone 04/24/2013  . Frequency of urination 04/14/2013  . Fever, unspecified 04/14/2013  . Hepatic steatosis 12/28/2012  .  Peripheral neuropathy 11/25/2012  . Melanoma of skin, site unspecified 11/25/2012  . Mixed hyperlipidemia   . Depression   . Hypertension associated with diabetes (HCC)   . GERD (gastroesophageal reflux disease) 04/03/2012  . Current smoker 03/14/2012  . Type 2 diabetes mellitus with stage 3a chronic kidney disease, with long-term current use of insulin (HCC)   . 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 BIOPSY  01/07/2018   Procedure: BIOPSY;  Surgeon: Corbin Ade, MD;  Location: [facility] ENDO  SU[facility];  Service: Endoscopy;;  duodenal and gastric biopsy  . BIOPSY  04/25/2018   Procedure: BIOPSY;  Surgeon: Corbin Ade, MD;  Location: [facility] END[facility] SU[facility];  Service: Endoscopy;;  ascending and descending and sigmoid biopsies  . CARDIAC CATHETERIZATION     with stent  . COLONOSCOPY N/A 01/27/2013   FXO:VANVBT and colonic polyps. Tubular adenomas, poor bowel prep, one-year follow-up surveillance colonoscopy recommended  . COLONOSCOPY WITH PROPOFOL N/A 04/25/2018   Procedure: COLONOSCOPY WITH PROPOFOL;  Surgeon: Corbin Ade, MD;  Location: [facility] END[facility] SU[facility];  Service: Endoscopy;  Laterality: N/A;  11:00am - pt to be prepped INPT and labs to be done as well  . CORONARY ANGIOPLASTY WITH STENT PLACEMENT    . ESOPHAGOGASTRODUODENOSCOPY  04/24/2012   [place]k-mild erosive reflux esophagitis,dilated w/71F Elease Hashimoto, small HH, minimal chronic gastric/bulbar erosions(No H pylori)  . ESOPHAGOGASTRODUODENOSCOPY (EGD) WITH PROPOFOL N/A 01/07/2018   Dr. Jena Gauss: Erythematous mucosa in the stomach, retained gastric contents, incomplete exam.  Biopsied showed mild chronic gastritis but no H. pylori.  Duodenal biopsy was negative for celiac disease.  [name] FLEXIBLE SIGMOIDOSCOPY N/A 01/07/2018   Dr. Jena Gauss: Incomplete colonoscopy due to inadequate bowel prep  . LEFT HEART CATHETERIZATION WITH CORONARY ANGIOGRAM N/A 01/22/2012   Procedure: LEFT HEART CATHETERIZATION WITH CORONARY ANGIOGRAM;  Surgeon: Dolores Patty, MD;  Location: [facility] CATH LAB;  Service: Cardiovascular;  Laterality: N/A;  . melanoma surgery  2007   [facility], removed lymph nodes under arm as well,Left abd       Family History  Problem Relation Age of Onset  . Stroke Mother   . Alcohol abuse Father   . Heart disease Other   . Other Other        not real familiar with family history  . Lung cancer Other   . Colon cancer Neg Hx   . Colon polyps Neg Hx     Social History   Tobacco Use  . Smoking status: Current Every Day Smoker    Packs/day: 1.00     Years: 31.00    Foulk years: 31.00    Types: Cigarettes    Start date: 10/09/1981  . Smokeless tobacco: Never Used  . Tobacco comment: 1/2 [place] daily 11/01/17  Vaping Use  . Vaping Use: Never used  Substance Use Topics  . 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  . Drug use: No    Home Medications Prior to Admission medications   Medication Sig Start Date End Date Taking? Authorizing Provider  acetaminophen (TYLENOL) 325 MG tablet Take 2 tablets (650 mg total) by mouth every 6 (six) hours as needed for mild pain, moderate pain or fever (or Fever >/= 101). 03/21/19   Shon Hale, MD  albuterol (PROVENTIL) (2.5 MG/3ML) 0.083% nebulizer solution Take 3 mLs (2.5 mg  total) by nebulization every 6 (six) hours as needed for wheezing or shortness of breath. Dx---44.1 02/16/20   [name], DO  albuterol (VENTOLIN HFA) 108 (90 Base) MCG/ACT inhaler 2 puffs every 6 hours prn 07/01/20   Ronnie Doss M, DO  apixaban (ELIQUIS) 5 MG TABS tablet Take 1 tablet (5 mg total) by mouth 2 (two) times daily. 07/16/19   Strader, Fransisco Hertz, PA-C  aspirin EC 81 MG tablet Take 1 tablet (81 mg total) by mouth daily with breakfast. 03/21/19   Roxan Hockey, MD  atorvastatin (LIPITOR) 80 MG tablet Take 1 tablet (80 mg total) by mouth daily. 07/01/20   [name], DO  Blood Glucose Monitoring Suppl (ACCU-CHEK GUIDE) w/Device KIT 1 Piece by Does not apply route as directed. 06/30/20   Claretta Fraise, MD  busPIRone (BUSPAR) 7.5 MG tablet TAKE (1) TABLET TWICE DAILY. 07/01/20   [name], DO  famotidine (PEPCID) 20 MG tablet Take 20 mg by mouth 2 (two) times daily. 12/22/19   [provider]  furosemide (LASIX) 40 MG tablet Take 1 tablet (40 mg total) by mouth daily. For swelling 07/01/20   Ronnie Doss M, DO  gabapentin (NEURONTIN) 600 MG tablet Take 1  tablet (600 mg total) by mouth 3 (three) times daily. 07/01/20   [name], DO  glucose blood (ACCU-CHEK GUIDE) test strip Use as instructed 07/01/20   Ronnie Doss M, DO  insulin glargine (LANTUS SOLOSTAR) 100 UNIT/ML Solostar Pen Inject 60 Units into the skin every evening. 07/01/20   Ronnie Doss M, DO  lisinopril (ZESTRIL) 10 MG tablet Take 1 tablet (10 mg total) by mouth at bedtime. 07/01/20   [name], DO  metFORMIN (GLUCOPHAGE) 500 MG tablet Take 1 tablet (500 mg total) by mouth 2 (two) times daily with a meal. 11/12/19   Nida, Marella Chimes, MD  mometasone-formoterol (DULERA) 200-5 MCG/ACT AERO Inhale 2 puffs into the lungs in the morning and at bedtime. 07/20/20   [name], DO  nitroGLYCERIN (NITROSTAT) 0.4 MG SL tablet Place 1 tablet (0.4 mg total) under the tongue every 5 (five) minutes as needed. For chest pain. 08/13/12   Reece Packer, NP  tiZANidine (ZANAFLEX) 4 MG tablet Take 4-8 mg by mouth every 6 (six) hours as needed for muscle spasms.    [provider]  traZODone (DESYREL) 150 MG tablet TAKE 1 TABLET BY MOUTH ONCE DAILY AT BEDTIME 08/31/20   Ronnie Doss M, DO  metoprolol tartrate (LOPRESSOR) 25 MG tablet Take 25 mg by mouth 2 (two) times daily.    12/27/11  [provider]    Allergies    Patient has no known allergies.  Review of Systems   Review of Systems  Constitutional: Negative for fever.  Respiratory:       Chronic SOB   Cardiovascular: Positive for leg swelling.  Gastrointestinal: Negative for abdominal pain.  Musculoskeletal: Positive for back pain.  Neurological: Negative for weakness.  All other systems reviewed and are negative.   Physical Exam Updated Vital Signs BP 137/89   Pulse 85   Temp 97.8 F (36.6 C) (Oral)   Resp 20   Ht 1.88 m ($Remov'6\' 2"'gOBBgN$ )   Wt 95.6 kg   SpO2 100%   BMI 27.07 kg/m   Physical Exam CONSTITUTIONAL: Chronically ill-appearing, no acute distress HEAD:  Normocephalic/atraumatic EYES: EOMI/PERRL ENMT: Poor dentition NECK: supple no meningeal signs, minimal JVD CV: S1/S2 noted, no murmurs/rubs/gallops noted LUNGS: Lungs are clear to auscultation  bilaterally, no apparent distress ABDOMEN: soft, nontender, no rebound or guarding GU:no cva tenderness NEURO: Awake/alert, equal motor 5/5 strength noted with the following: hip flexion/knee flexion/extension, foot dorsi/plantar flexion, great toe extension intact bilaterally, no sensory deficit in any dermatome.   EXTREMITIES: pulses normalx2, full ROM, symmetric pitting edema to bilateral lower extremities.  No significant erythema noted SKIN: warm, color normal PSYCH: no abnormalities of mood noted, alert and oriented to situation   ED Results / Procedures / Treatments   Labs (all labs ordered are listed, but only abnormal results are displayed) Labs Reviewed  CBC WITH DIFFERENTIAL/PLATELET - Abnormal; Notable for the following components:      Result Value   RBC 4.03 (*)    Hemoglobin 12.7 (*)    nRBC 0.7 (*)    All other components within normal limits  COMPREHENSIVE METABOLIC PANEL - Abnormal; Notable for the following components:   Sodium 126 (*)    Chloride 93 (*)    Glucose, Bld 587 (*)    Creatinine, Ser 1.42 (*)    Calcium 8.5 (*)    Total Protein 6.0 (*)    Albumin 2.6 (*)    Alkaline Phosphatase 155 (*)    GFR, Estimated 58 (*)    All other components within normal limits    EKG None  Radiology No results found.  Procedures Procedures    Medications Ordered in ED Medications  methocarbamol (ROBAXIN) tablet 500 mg (500 mg Oral Given 09/01/20 0405)    ED Course  I have reviewed the triage vital signs and the nursing notes.  Pertinent labs  results that were available during my care of the patient were reviewed by me and considered in my medical decision making (see chart for details).    MDM Rules/Calculators/A&P                          3:56 AM Patient  with extensive history including coronary disease, CHF, diabetes, CKD with questionable medication compliance presents with lower extremity edema.  He reports running out of Lasix in the past 2 days.  Is had increasing pain and swelling in the legs.  He is also requesting medications for back spasms.  Patient is in no acute distress.  He has had no significant weight gain compared to prior office visits We will check his labs, then refill Lasix 4:59 AM Patient found to have hyperglycemia without anion gap.  Patient's renal insufficiency is at baseline.  I was planning to give patient insulin and monitor him to ensure his glucose is improving.  Patient refuses this medication as he is demanding discharge home.  He plans to go straight home and take his insulin.  He reports he sometimes does not take his medications at night for fear of hypoglycemia  Course of Lasix has been ordered, and he has close PCP follow-up within a month Final Clinical Impression(s) / ED Diagnoses Final diagnoses:  Hyperglycemia  Muscle spasm  Peripheral edema    Rx / DC Orders ED Discharge Orders         Ordered    furosemide (LASIX) 40 MG tablet  2 times daily        09/01/20 0448           Ripley Fraise, MD 09/01/20 0500

## 2020-09-02 ENCOUNTER — Emergency Department (HOSPITAL_COMMUNITY): Payer: 59

## 2020-09-02 ENCOUNTER — Emergency Department (HOSPITAL_COMMUNITY)
Admission: EM | Admit: 2020-09-02 | Discharge: 2020-09-02 | Disposition: A | Discharge status: Home / Self Care | Payer: 59 | Attending: Emergency Medicine | Admitting: Emergency Medicine

## 2020-09-02 ENCOUNTER — Encounter (HOSPITAL_COMMUNITY): Payer: Self-pay

## 2020-09-02 ENCOUNTER — Other Ambulatory Visit: Payer: Self-pay

## 2020-09-02 DIAGNOSIS — N183 Chronic kidney disease, stage 3 unspecified: Secondary | ICD-10-CM | POA: Diagnosis not present

## 2020-09-02 DIAGNOSIS — Z7982 Long term (current) use of aspirin: Secondary | ICD-10-CM | POA: Insufficient documentation

## 2020-09-02 DIAGNOSIS — I872 Venous insufficiency (chronic) (peripheral): Secondary | ICD-10-CM | POA: Insufficient documentation

## 2020-09-02 DIAGNOSIS — Z79899 Other long term (current) drug therapy: Secondary | ICD-10-CM | POA: Diagnosis not present

## 2020-09-02 DIAGNOSIS — E1165 Type 2 diabetes mellitus with hyperglycemia: Secondary | ICD-10-CM | POA: Diagnosis not present

## 2020-09-02 DIAGNOSIS — R739 Hyperglycemia, unspecified: Secondary | ICD-10-CM

## 2020-09-02 DIAGNOSIS — R2243 Localized swelling, mass and lump, lower limb, bilateral: Secondary | ICD-10-CM | POA: Diagnosis not present

## 2020-09-02 DIAGNOSIS — I5031 Acute diastolic (congestive) heart failure: Secondary | ICD-10-CM | POA: Insufficient documentation

## 2020-09-02 DIAGNOSIS — I130 Hypertensive heart and chronic kidney disease with heart failure and stage 1 through stage 4 chronic kidney disease, or unspecified chronic kidney disease: Secondary | ICD-10-CM | POA: Diagnosis not present

## 2020-09-02 DIAGNOSIS — E1122 Type 2 diabetes mellitus with diabetic chronic kidney disease: Secondary | ICD-10-CM | POA: Diagnosis not present

## 2020-09-02 DIAGNOSIS — R600 Localized edema: Secondary | ICD-10-CM

## 2020-09-02 DIAGNOSIS — F17210 Nicotine dependence, cigarettes, uncomplicated: Secondary | ICD-10-CM | POA: Insufficient documentation

## 2020-09-02 DIAGNOSIS — R0602 Shortness of breath: Secondary | ICD-10-CM | POA: Diagnosis present

## 2020-09-02 DIAGNOSIS — Z7984 Long term (current) use of oral hypoglycemic drugs: Secondary | ICD-10-CM | POA: Diagnosis not present

## 2020-09-02 LAB — COMPREHENSIVE METABOLIC PANEL
ALT: 35 U/L (ref 0–44)
AST: 30 U/L (ref 15–41)
Albumin: 2.9 g/dL — ABNORMAL LOW (ref 3.5–5.0)
Alkaline Phosphatase: 169 U/L — ABNORMAL HIGH (ref 38–126)
Anion gap: 7 (ref 5–15)
BUN: 22 mg/dL — ABNORMAL HIGH (ref 6–20)
CO2: 27 mmol/L (ref 22–32)
Calcium: 8.6 mg/dL — ABNORMAL LOW (ref 8.9–10.3)
Chloride: 93 mmol/L — ABNORMAL LOW (ref 98–111)
Creatinine, Ser: 1.54 mg/dL — ABNORMAL HIGH (ref 0.61–1.24)
GFR, Estimated: 53 mL/min — ABNORMAL LOW (ref >60)
Glucose, Bld: 511 mg/dL (ref 70–99)
Potassium: 4.6 mmol/L (ref 3.5–5.1)
Sodium: 127 mmol/L — ABNORMAL LOW (ref 135–145)
Total Bilirubin: 0.5 mg/dL (ref 0.3–1.2)
Total Protein: 6.6 g/dL (ref 6.5–8.1)

## 2020-09-02 LAB — CBC WITH DIFFERENTIAL/PLATELET
Abs Immature Granulocytes: 0.03 10*3/uL (ref 0.00–0.07)
Basophils Absolute: 0.1 10*3/uL (ref 0.0–0.1)
Basophils Relative: 1 %
Eosinophils Absolute: 0.1 10*3/uL (ref 0.0–0.5)
Eosinophils Relative: 1 %
HCT: 43.8 % (ref 39.0–52.0)
Hemoglobin: 14.2 g/dL (ref 13.0–17.0)
Immature Granulocytes: 0 %
Lymphocytes Relative: 18 %
Lymphs Abs: 1.4 10*3/uL (ref 0.7–4.0)
MCH: 31.7 pg (ref 26.0–34.0)
MCHC: 32.4 g/dL (ref 30.0–36.0)
MCV: 97.8 fL (ref 80.0–100.0)
Monocytes Absolute: 0.7 10*3/uL (ref 0.1–1.0)
Monocytes Relative: 9 %
Neutro Abs: 5.7 10*3/uL (ref 1.7–7.7)
Neutrophils Relative %: 71 %
Platelets: 330 10*3/uL (ref 150–400)
RBC: 4.48 MIL/uL (ref 4.22–5.81)
RDW: 14.7 % (ref 11.5–15.5)
WBC: 8 10*3/uL (ref 4.0–10.5)
nRBC: 0 % (ref 0.0–0.2)

## 2020-09-02 LAB — BRAIN NATRIURETIC PEPTIDE: B Natriuretic Peptide: 1666 pg/mL — ABNORMAL HIGH (ref 0.0–100.0)

## 2020-09-02 LAB — CBG MONITORING, ED: Glucose-Capillary: 470 mg/dL — ABNORMAL HIGH (ref 70–99)

## 2020-09-02 MED ORDER — FUROSEMIDE 40 MG PO TABS
40.0000 mg | ORAL_TABLET | Freq: Once | ORAL | Status: AC
  Administered 2020-09-02: 40 mg via ORAL
  Filled 2020-09-02: qty 1

## 2020-09-02 MED ORDER — INSULIN ASPART 100 UNIT/ML ~~LOC~~ SOLN
20.0000 [IU] | Freq: Once | SUBCUTANEOUS | Status: AC
  Administered 2020-09-02: 20 [IU] via SUBCUTANEOUS
  Filled 2020-09-02: qty 1

## 2020-09-02 NOTE — ED Notes (Signed)
Here last night   Has gotten his lasix filled and has taken  Here for eval

## 2020-09-02 NOTE — ED Notes (Signed)
Lab in to draw

## 2020-09-02 NOTE — ED Notes (Signed)
Pt reports he must leave to go home   PA in to discuss with pt

## 2020-09-02 NOTE — ED Notes (Signed)
Date and time results received: 09/02/20 2106  Test: glucose Critical Value: 511  Name of Provider Notified: Nyoka Cowden, [place]  Orders Received? Or Actions Taken?: acknowledged

## 2020-09-02 NOTE — ED Notes (Signed)
PA in to assess   Awaiting rad and labs

## 2020-09-02 NOTE — Discharge Instructions (Addendum)
Your chest x-ray showed emphysema which is chronic and related to your history of tobacco use, but no other new disease.  Your lab work was notable for high blood sugar as well as fluid overload.  Please avoid sodas such as the [facility] that you had here tonight given the high sugar concentration.    Please take all your medications, as directed.  Please wear your compression stockings on a daily basis and make a concerted effort to elevate your legs whenever possible.  I also encourage you to monitor your ins and outs and check your weights regularly.  You will need to follow-up with your primary care provider regarding today's encounter and for ongoing evaluation and management.  You also have significantly elevated blood sugars and need to be taking your insulin, as directed.  You were given 20 units of insulin subcu here in the ED tonight.  Please continue to check your blood sugars regularly at home.  Your leg pain is likely related to your congestive heart failure and fluid overload.  This is the importance of taking your furosemide, as directed.  As for the rash on your lower extremities, this is a condition called venous stasis dermatitis.  Please read the attachment.  Please have a low threshold for return to the ED should you develop any new or worsening symptoms.

## 2020-09-02 NOTE — ED Provider Notes (Addendum)
[facility] EMERGENCY DEPARTMENT Provider Note   CSN: [id number] Arrival date & time: 09/02/20  1858     History Chief Complaint  Patient presents with  . Shortness of Breath    Dustin Cruz is a 54 y.o. male with PMH of stage III CKD, type II DM, asthma, COPD, HTN, HLD, CAD s/p MI, and CHF on furosemide 40 mg twice daily who presents to the ED with complaints of lower extremity edema with associated shortness of breath.  I reviewed patient's medical record and he was evaluated yesterday in the ER for similar complaints.  He had reported that he had recently run out of his Lasix medications and was having pain involving his leg swelling bilaterally.  He denies any orthopnea and was also complaining of back pain and requesting analgesics.  Patient ultimately was found to be hyperglycemic, but denied insulin administration in the ED and instead made to discharge home.  On my examination, patient was already getting restless after being in the ER for less than 1 hour and going to ambulate around the ED.  He has multiple complaints.  He states that his primary concern is bilateral lower leg swelling and "pressure" discomfort.  He also is complaining of a protuberant abdomen.  He admits that both of them are chronic issues.  He is dyspneic, particular with exertion.  He told me that he had taken insulin this morning upon return home, but then had a carrot cake with his Thanksgiving meal which she suspects is contributing to his elevated CBG here in the ED.  He is not endorsing any blurred vision, nausea, abdominal pain, or excessive fatigue.  Instead, patient is complaining of shortness of breath related to his protuberant abdomen which he admits is chronic.  His primary concern is still his lower extremity swelling and edema.  He states that his jeans feel "tight".  He has not taken any of his evening furosemide.  He denies any recent illness or infection, fevers or chills, cough, chest pain, room  spinning dizziness, numbness or weakness, or other symptoms.  Patient does state that he tries to elevate his legs periodically, but admits that he is poorly compliant with his compression stockings.  He did not wear them today.  He does not check his weights regularly and does not monitor fluid intake.  Patient states that he was given Robaxin for his spasms during last ED encounter which helped him tremendously.    HPI     Past Medical History:  Diagnosis Date  . Anxiety   . Asthma   . CKD stage 3 due to type 2 diabetes mellitus ([place])   . COPD (chronic obstructive pulmonary disease) ([place])   . Coronary artery disease    s/p BMS to [place] 10/2010;  Cath 01/22/12 prox 30-40% LAD, LCx ramus w/ hazy 70-80% in-stent restenosis, EF 60% treated medically  . Coronary artery disease    s/p BMS to [facility] 10/2010;  Cath 01/22/12 prox 30-40% LAD, LCx ramus w/ hazy 70-80% in-stent restenosis, EF 60% treated medically   . Depression   . Diabetes mellitus    Type 2  . Gastroparesis 05/2013  . GERD (gastroesophageal reflux disease)   . HTN (hypertension)   . Hyperlipidemia   . Hyperlipidemia   . Major depression, chronic 08/13/2012  . Melanoma ([place]) 2007   surgery at [facility], Followed by [place]  . Myocardial infarction ([place]) 2012  . Obesity   . Tobacco abuse   . Tubular adenoma   .  Urinary retention     Patient Active Problem List   Diagnosis Date Noted  . Acute diastolic CHF (congestive heart failure) ([place]) 07/23/2020  . Abnormal weight gain 07/23/2020  . Noncompliance 07/23/2020  . Pelvic lymphadenopathy 07/20/2020  . Acute on chronic diastolic CHF (congestive heart failure) ([place]) 06/30/2020  . CHF (congestive heart failure) ([place]) 06/29/2020  . Pre-ulcerative calluses 10/29/2019  . Musculoskeletal pain 10/13/2019  . Housing problems 05/23/2019  . Essential hypertension, benign 03/20/2019  . 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 Near syncope 03/17/2019  . AKI (acute kidney injury) ([place])  03/17/2019  . 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 CKD stage 3 due to type 2 diabetes mellitus ([place]) 11/28/2018  . Microalbuminuria due to type 2 diabetes mellitus ([place]) 05/22/2018  . Hyperglycemia 04/24/2018  . Abdominal pain, epigastric 11/29/2017  . Diarrhea 11/29/2017  . History of colonic polyps 11/29/2017  . Diffuse abdominal pain 11/29/2017  . Diabetic retinopathy ([place]) 09/21/2015  . Carotid bruit 09/21/2015  . Viral gastroenteritis 06/03/2015  . Hypertriglyceridemia 05/18/2015  . Vitamin D deficiency 12/29/2013  . Nausea alone 04/24/2013  . Frequency of urination 04/14/2013  . Fever, unspecified 04/14/2013  . Hepatic steatosis 12/28/2012  . Peripheral neuropathy 11/25/2012  . Melanoma of skin, site unspecified 11/25/2012  . Mixed hyperlipidemia   . Depression   . Hypertension associated with diabetes ([place])   . GERD (gastroesophageal reflux disease) 04/03/2012  . Current smoker 03/14/2012  . Type 2 diabetes mellitus with stage 3a chronic kidney disease, with long-term current use of insulin ([place])   . 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 BIOPSY  01/07/2018   Procedure: BIOPSY;  Surgeon: Daneil Dolin, MD;  Location: [facility] ENDO SUITE;  Service: Endoscopy;;  duodenal and gastric biopsy  . BIOPSY  04/25/2018   Procedure: BIOPSY;  Surgeon: Daneil Dolin, MD;  Location: [facility] ENDO SU[facility];  Service: Endoscopy;;  ascending and descending and sigmoid biopsies  . CARDIAC CATHETERIZATION     with stent  . COLONOSCOPY N/A 01/27/2013   WER:XVQMGQ and colonic polyps. Tubular adenomas, poor bowel prep, one-year follow-up surveillance colonoscopy recommended  . COLONOSCOPY WITH PROPOFOL N/A 04/25/2018   Procedure: COLONOSCOPY WITH PROPOFOL;  Surgeon: Daneil Dolin, MD;  Location: [facility] ENDO SUITE;  Service: Endoscopy;  Laterality: N/A;  11:00am - pt to be prepped INPT and labs to be done as well  . CORONARY ANGIOPLASTY  WITH STENT PLACEMENT    . ESOPHAGOGASTRODUODENOSCOPY  04/24/2012   [place]k-mild erosive reflux esophagitis,dilated w/8F Venia Minks, small HH, minimal chronic gastric/bulbar erosions(No H pylori)  . ESOPHAGOGASTRODUODENOSCOPY (EGD) WITH PROPOFOL N/A 01/07/2018   Dr. Gala Romney: Erythematous mucosa in the stomach, retained gastric contents, incomplete exam.  Biopsied showed mild chronic gastritis but no H. pylori.  Duodenal biopsy was negative for celiac disease.  [name] FLEXIBLE SIGMOIDOSCOPY N/A 01/07/2018   Dr. Gala Romney: Incomplete colonoscopy due to inadequate bowel prep  . LEFT HEART CATHETERIZATION WITH CORONARY ANGIOGRAM N/A 01/22/2012   Procedure: LEFT HEART CATHETERIZATION WITH CORONARY ANGIOGRAM;  Surgeon: Jolaine Artist, MD;  Location: [facility] CATH LAB;  Service: Cardiovascular;  Laterality: N/A;  . melanoma surgery  2007   [place], removed lymph nodes under arm as well,Left abd       Family History  Problem Relation Age of Onset  . Stroke Mother   . Alcohol abuse Father   . Heart disease Other   . Other Other        not  real familiar with family history  . Lung cancer Other   . Colon cancer Neg Hx   . Colon polyps Neg Hx     Social History   Tobacco Use  . Smoking status: Current Every Day Smoker    Packs/day: 1.00    Years: 31.00    [place] years: 31.00    Types: Cigarettes    Start date: 10/09/1981  . Smokeless tobacco: Never Used  . Tobacco comment: 1/2 [place] daily 11/01/17  Vaping Use  . Vaping Use: Never used  Substance Use Topics  . 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  . Drug use: No    Home Medications Prior to Admission medications   Medication Sig Start Date End Date Taking? Authorizing Provider  acetaminophen (TYLENOL) 325 MG tablet Take 2 tablets (650 mg total) by mouth every 6 (six) hours as needed for mild pain, moderate pain or fever (or Fever >/=  101). 03/21/19   Roxan Hockey, MD  albuterol (PROVENTIL) (2.5 MG/3ML) 0.083% nebulizer solution Take 3 mLs (2.5 mg total) by nebulization every 6 (six) hours as needed for wheezing or shortness of breath. Dx---44.1 02/16/20   [name], DO  albuterol (VENTOLIN HFA) 108 (90 Base) MCG/ACT inhaler 2 puffs every 6 hours prn 07/01/20   Ronnie Doss M, DO  apixaban (ELIQUIS) 5 MG TABS tablet Take 1 tablet (5 mg total) by mouth 2 (two) times daily. 07/16/19   Strader, Fransisco Hertz, PA-C  aspirin EC 81 MG tablet Take 1 tablet (81 mg total) by mouth daily with breakfast. 03/21/19   Roxan Hockey, MD  atorvastatin (LIPITOR) 80 MG tablet Take 1 tablet (80 mg total) by mouth daily. 07/01/20   [name], DO  Blood Glucose Monitoring Suppl (ACCU-CHEK GUIDE) w/Device KIT 1 Piece by Does not apply route as directed. 06/30/20   Claretta Fraise, MD  busPIRone (BUSPAR) 7.5 MG tablet TAKE (1) TABLET TWICE DAILY. 07/01/20   Gottschalk, Ashly M, DO  ENTRESTO 24-26 MG Take 1 tablet by mouth 2 (two) times daily. 08/01/20   [provider]  famotidine (PEPCID) 20 MG tablet Take 20 mg by mouth 2 (two) times daily. 12/22/19   [provider]  furosemide (LASIX) 40 MG tablet Take 1 tablet (40 mg total) by mouth 2 (two) times daily. 09/01/20   Ripley Fraise, MD  gabapentin (NEURONTIN) 600 MG tablet Take 1 tablet (600 mg total) by mouth 3 (three) times daily. 07/01/20   [name], DO  glucose blood (ACCU-CHEK GUIDE) test strip Use as instructed 07/01/20   Ronnie Doss M, DO  insulin glargine (LANTUS SOLOSTAR) 100 UNIT/ML Solostar Pen Inject 60 Units into the skin every evening. 07/01/20   Ronnie Doss M, DO  lisinopril (ZESTRIL) 10 MG tablet Take 1 tablet (10 mg total) by mouth at bedtime. 07/01/20   [name], DO  lisinopril (ZESTRIL) 5 MG tablet Take 5 mg by mouth at bedtime. 07/08/20   [provider]  metFORMIN (GLUCOPHAGE) 500 MG tablet Take 1 tablet  (500 mg total) by mouth 2 (two) times daily with a meal. 11/12/19   Nida, Marella Chimes, MD  metoprolol succinate (TOPROL-XL) 25 MG 24 hr tablet Take 12.5 mg by mouth daily. 07/30/20   [provider]  mometasone-formoterol (DULERA) 200-5 MCG/ACT AERO Inhale 2 puffs into the lungs in the morning  and at bedtime. 07/20/20   [name], DO  nitroGLYCERIN (NITROSTAT) 0.4 MG SL tablet Place 1 tablet (0.4 mg total) under the tongue every 5 (five) minutes as needed. For chest pain. 08/13/12   Reece Packer, NP  tiZANidine (ZANAFLEX) 4 MG tablet Take 4-8 mg by mouth every 6 (six) hours as needed for muscle spasms.    [provider]  traZODone (DESYREL) 150 MG tablet TAKE 1 TABLET BY MOUTH ONCE DAILY AT BEDTIME 08/31/20   Ronnie Doss M, DO  metoprolol tartrate (LOPRESSOR) 25 MG tablet Take 25 mg by mouth 2 (two) times daily.    12/27/11  [provider]    Allergies    Patient has no known allergies.  Review of Systems   Review of Systems  All other systems reviewed and are negative.   Physical Exam Updated Vital Signs BP (!) 137/91   Pulse 69   Temp 97.6 F (36.4 C) (Oral)   Resp 16   Ht '6\' 2"'  (1.88 m)   Wt 97.1 kg   SpO2 98%   BMI 27.48 kg/m   Physical Exam Vitals and nursing note reviewed. Exam conducted with a chaperone present.  Constitutional:      General: He is not in acute distress.    Appearance: Normal appearance. He is not ill-appearing or toxic-appearing.  HENT:     Head: Normocephalic and atraumatic.  Eyes:     General: No scleral icterus.    Conjunctiva/sclera: Conjunctivae normal.  Cardiovascular:     Rate and Rhythm: Normal rate.  Pulmonary:     Breath sounds: Normal breath sounds.     Comments: Borderline tachypnea.  States in full sentences.  No acute respiratory distress.  No excessive muscle use.  No wheezing or obvious rales noted on exam.  CTA BL. Abdominal:     Comments: Protuberant abdomen. Soft. No areas of  tenderness.  No guarding.  Musculoskeletal:     Cervical back: Normal range of motion. No rigidity.     Right lower leg: Edema present.     Left lower leg: Edema present.     Comments: 2+ pitting edema in lower extremities and top of bilaterally.  Overlying skin changes consistent with venous stasis dermatitis.  Able to wiggle toes and sensation intact throughout.  Skin:    General: Skin is dry.     Capillary Refill: Capillary refill takes less than 2 seconds.     Findings: Rash present.  Neurological:     Mental Status: He is alert and oriented to person, place, and time.     GCS: GCS eye subscore is 4. GCS verbal subscore is 5. GCS motor subscore is 6.  Psychiatric:        Mood and Affect: Mood normal.        Behavior: Behavior normal.        Thought Content: Thought content normal.     ED Results / Procedures / Treatments   Labs (all labs ordered are listed, but only abnormal results are displayed) Labs Reviewed  COMPREHENSIVE METABOLIC PANEL - Abnormal; Notable for the following components:      Result Value   Sodium 127 (*)    Chloride 93 (*)    Glucose, Bld 511 (*)    BUN 22 (*)    Creatinine, Ser 1.54 (*)    Calcium 8.6 (*)    Albumin 2.9 (*)    Alkaline Phosphatase 169 (*)    GFR, Estimated 53 (*)  All other components within normal limits  BRAIN NATRIURETIC PEPTIDE - Abnormal; Notable for the following components:   B Natriuretic Peptide 1,666.0 (*)    All other components within normal limits  CBG MONITORING, ED - Abnormal; Notable for the following components:   Glucose-Capillary 470 (*)    All other components within normal limits  CBC WITH DIFFERENTIAL/PLATELET    EKG EKG Interpretation  Date/Time:  Thursday September 02 2020 20:45:14 EST Ventricular Rate:  68 PR Interval:    QRS Duration: 148 QT Interval:  436 QTC Calculation: 464 R Axis:   118 Text Interpretation: Sinus rhythm Ventricular trigeminy Prolonged PR interval Nonspecific  intraventricular conduction delay Abnormal lateral Q waves Probable anteroseptal infarct, recent No STEMI Confirmed by Octaviano Glow [phone]) on 09/02/2020 8:54:34 PM   Radiology DG Chest Portable 1 View  Result Date: 09/02/2020 CLINICAL DATA:  Shortness of breath., COPD, hypertension, asthma, history of cancer. Current smoker. EXAM: PORTABLE CHEST 1 VIEW COMPARISON:  Chest x-ray 07/23/2020, CT angio chest 05/30/2020. FINDINGS: The heart size and mediastinal contours are within normal limits. Hyperinflation and coarsened interstitial markings consistent with emphysematous changes. No focal consolidation. No pulmonary edema. No pleural effusion. No pneumothorax. No acute osseous abnormality. IMPRESSION: 1. No active disease. 2.  Emphysema (ICD10-J43.9). 3. Please evaluate patient on age and smoking history to determine if patient meets the inclusion criteria for specialized low-dose lung cancer screening chest CT. Electronically Signed   By: Iven Finn M.D.   On: 09/02/2020 20:47    Procedures Procedures (including critical care time)  Medications Ordered in ED Medications  insulin aspart (novoLOG) injection 20 Units (has no administration in time range)  furosemide (LASIX) tablet 40 mg (has no administration in time range)    ED Course  I have reviewed the triage vital signs and the nursing notes.  Pertinent labs & imaging results that were available during my care of the patient were reviewed by me and considered in my medical decision making (see chart for details).  Clinical Course as of Sep 02 2118  Thu Sep 02, 2020  2054 Initial ECG erroneously signed as A Fib.  I do not believe this is A Fib, but per repeat her appears to have 1st degree heart block with occasional PVC's.   [MT]    Clinical Course User Index [MT] Wyvonnia Dusky, MD   MDM Rules/Calculators/A&P                          Patient is presented to the ED with complaints of shortness of breath, particular  with exertion, and lower extremity swelling and discomfort.  He admits these are chronic complaints and he also admits that he is poorly compliant with his medications.  He was recently restarted on his furosemide medications, but has only had a couple of doses since discharge from the ED early yesterday morning.  He had not yet taken his evening dose.  He also admits to enjoying care cake during his Thanksgiving meal, which he suspects explains his hyperglycemia to 470.  He was inquiring as to his lower extremity rash which appears to be consistent with venous stasis dermatitis.  I reviewed patient's medical record and most recent echocardiogram obtained 07/24/2020 demonstrated LVEF 60 to 65%.  He was last evaluated by primary care provider [place] FNP 08/09/2020 when he was also complaining of abdominal and lower extremity swelling.  Initial EKG was difficult to interpret, obtain subsequent EKG.  Laboratory work-up  and chest x-ray is pending.    Labs CBC with differential: Entirely within normal limits. BNP: Elevated at 1666, relatively consistent with recent labs. CMP: Hyperglycemia to 511.  Remarkably improved when compared to recent labs.  No elevated anion gap or reduced bicarbonate.  Hyponatremia and CKD also consistent with baseline. CBG: 470.  Patient states that his leg swelling and shortness of breath is consistent with his baseline.  He is not in any acute distress and wants to go home.  He states that the woman he is staying with is closing her doors probably at 10 PM.  He is driving there and it takes approximately 45 minutes to get home.  I emphasized the importance of close outpatient follow-up given his numerous comorbidities.  I also emphasized the importance of checking his weights regularly, monitoring his ins and outs, taking his medications as directed, and elevating his legs.  I also encouraged very strict ED return precautions.  We will give him his furosemide 40 mg p.o. here in the  ED and also give him 20 units of insulin subcu.  He had a bottle of Mountain Dew at bedside which likely caused his CBG to increase from 470 to 511.  Discussed case with Dr. Langston Masker who agrees that patient is reasonable for discharge home given that this appears to be a chronic issue and he is in no apparent distress.  Patient voices understanding and is agreeable to plan.  Final Clinical Impression(s) / ED Diagnoses Final diagnoses:  Bilateral lower extremity edema  Venous stasis dermatitis of both lower extremities  Hyperglycemia    Rx / DC Orders ED Discharge Orders    None       Corena Herter, PA-C 09/02/20 2118    Corena Herter, PA-C 09/02/20 2119    Wyvonnia Dusky, MD 09/03/20 1032

## 2020-09-02 NOTE — ED Notes (Signed)
Pt refused insulin last night and has not taken diabetes meds today   BS 470

## 2020-09-02 NOTE — ED Notes (Signed)
Pt reports he must be gone by 2200 as he lives with a lady friend and she locks the doors at ten   EDP and PA informed

## 2020-09-02 NOTE — ED Triage Notes (Addendum)
SOB + LE edema. Was here yesterday for the same. States he finally got his lasix refilled and taken it today. 100% on room air. Able to talk in complete sentences

## 2020-09-03 ENCOUNTER — Other Ambulatory Visit: Payer: Self-pay | Admitting: Family Medicine

## 2020-09-06 NOTE — Telephone Encounter (Signed)
30 day supply sent in-pt has appt tomorrow with Dr Darnell Level

## 2020-09-07 ENCOUNTER — Ambulatory Visit: Payer: 59 | Admitting: Family Medicine

## 2020-09-08 ENCOUNTER — Ambulatory Visit (HOSPITAL_COMMUNITY): Payer: 59

## 2020-09-10 ENCOUNTER — Other Ambulatory Visit: Payer: Self-pay

## 2020-09-10 ENCOUNTER — Encounter (HOSPITAL_COMMUNITY): Payer: Self-pay | Admitting: *Deleted

## 2020-09-10 ENCOUNTER — Emergency Department (HOSPITAL_COMMUNITY)
Admission: EM | Admit: 2020-09-10 | Discharge: 2020-09-10 | Disposition: A | Discharge status: Home / Self Care | Payer: 59 | Attending: Emergency Medicine | Admitting: Emergency Medicine

## 2020-09-10 ENCOUNTER — Emergency Department (HOSPITAL_COMMUNITY)
Admission: EM | Admit: 2020-09-10 | Discharge: 2020-09-10 | Disposition: A | Discharge status: Home / Self Care | Payer: 59 | Source: Home / Self Care

## 2020-09-10 ENCOUNTER — Encounter (HOSPITAL_COMMUNITY): Payer: Self-pay | Admitting: Emergency Medicine

## 2020-09-10 DIAGNOSIS — N1831 Chronic kidney disease, stage 3a: Secondary | ICD-10-CM | POA: Insufficient documentation

## 2020-09-10 DIAGNOSIS — J45909 Unspecified asthma, uncomplicated: Secondary | ICD-10-CM | POA: Insufficient documentation

## 2020-09-10 DIAGNOSIS — Z955 Presence of coronary angioplasty implant and graft: Secondary | ICD-10-CM | POA: Diagnosis not present

## 2020-09-10 DIAGNOSIS — R140 Abdominal distension (gaseous): Secondary | ICD-10-CM | POA: Diagnosis not present

## 2020-09-10 DIAGNOSIS — I130 Hypertensive heart and chronic kidney disease with heart failure and stage 1 through stage 4 chronic kidney disease, or unspecified chronic kidney disease: Secondary | ICD-10-CM | POA: Insufficient documentation

## 2020-09-10 DIAGNOSIS — F17210 Nicotine dependence, cigarettes, uncomplicated: Secondary | ICD-10-CM | POA: Diagnosis not present

## 2020-09-10 DIAGNOSIS — E1129 Type 2 diabetes mellitus with other diabetic kidney complication: Secondary | ICD-10-CM | POA: Insufficient documentation

## 2020-09-10 DIAGNOSIS — E1169 Type 2 diabetes mellitus with other specified complication: Secondary | ICD-10-CM | POA: Diagnosis not present

## 2020-09-10 DIAGNOSIS — Z794 Long term (current) use of insulin: Secondary | ICD-10-CM | POA: Diagnosis not present

## 2020-09-10 DIAGNOSIS — E11319 Type 2 diabetes mellitus with unspecified diabetic retinopathy without macular edema: Secondary | ICD-10-CM | POA: Insufficient documentation

## 2020-09-10 DIAGNOSIS — Z79899 Other long term (current) drug therapy: Secondary | ICD-10-CM | POA: Insufficient documentation

## 2020-09-10 DIAGNOSIS — J449 Chronic obstructive pulmonary disease, unspecified: Secondary | ICD-10-CM | POA: Insufficient documentation

## 2020-09-10 DIAGNOSIS — Z85820 Personal history of malignant melanoma of skin: Secondary | ICD-10-CM | POA: Diagnosis not present

## 2020-09-10 DIAGNOSIS — R109 Unspecified abdominal pain: Secondary | ICD-10-CM | POA: Insufficient documentation

## 2020-09-10 DIAGNOSIS — Z86010 Personal history of colonic polyps: Secondary | ICD-10-CM | POA: Insufficient documentation

## 2020-09-10 DIAGNOSIS — Z5321 Procedure and treatment not carried out due to patient leaving prior to being seen by health care provider: Secondary | ICD-10-CM | POA: Insufficient documentation

## 2020-09-10 DIAGNOSIS — I5033 Acute on chronic diastolic (congestive) heart failure: Secondary | ICD-10-CM | POA: Insufficient documentation

## 2020-09-10 DIAGNOSIS — R82998 Other abnormal findings in urine: Secondary | ICD-10-CM | POA: Insufficient documentation

## 2020-09-10 DIAGNOSIS — Z7951 Long term (current) use of inhaled steroids: Secondary | ICD-10-CM | POA: Insufficient documentation

## 2020-09-10 DIAGNOSIS — J069 Acute upper respiratory infection, unspecified: Secondary | ICD-10-CM | POA: Insufficient documentation

## 2020-09-10 DIAGNOSIS — E1122 Type 2 diabetes mellitus with diabetic chronic kidney disease: Secondary | ICD-10-CM | POA: Diagnosis not present

## 2020-09-10 DIAGNOSIS — Z7901 Long term (current) use of anticoagulants: Secondary | ICD-10-CM | POA: Insufficient documentation

## 2020-09-10 DIAGNOSIS — E782 Mixed hyperlipidemia: Secondary | ICD-10-CM | POA: Diagnosis not present

## 2020-09-10 DIAGNOSIS — I2510 Atherosclerotic heart disease of native coronary artery without angina pectoris: Secondary | ICD-10-CM | POA: Insufficient documentation

## 2020-09-10 DIAGNOSIS — R059 Cough, unspecified: Secondary | ICD-10-CM | POA: Diagnosis present

## 2020-09-10 DIAGNOSIS — Z7982 Long term (current) use of aspirin: Secondary | ICD-10-CM | POA: Diagnosis not present

## 2020-09-10 DIAGNOSIS — E1140 Type 2 diabetes mellitus with diabetic neuropathy, unspecified: Secondary | ICD-10-CM | POA: Diagnosis not present

## 2020-09-10 LAB — COMPREHENSIVE METABOLIC PANEL
ALT: 43 U/L (ref 0–44)
AST: 44 U/L — ABNORMAL HIGH (ref 15–41)
Albumin: 3 g/dL — ABNORMAL LOW (ref 3.5–5.0)
Alkaline Phosphatase: 198 U/L — ABNORMAL HIGH (ref 38–126)
Anion gap: 10 (ref 5–15)
BUN: 19 mg/dL (ref 6–20)
CO2: 29 mmol/L (ref 22–32)
Calcium: 9 mg/dL (ref 8.9–10.3)
Chloride: 92 mmol/L — ABNORMAL LOW (ref 98–111)
Creatinine, Ser: 1.26 mg/dL — ABNORMAL HIGH (ref 0.61–1.24)
GFR, Estimated: >60 mL/min (ref >60)
Glucose, Bld: 413 mg/dL — ABNORMAL HIGH (ref 70–99)
Potassium: 3.9 mmol/L (ref 3.5–5.1)
Sodium: 131 mmol/L — ABNORMAL LOW (ref 135–145)
Total Bilirubin: 0.7 mg/dL (ref 0.3–1.2)
Total Protein: 7.4 g/dL (ref 6.5–8.1)

## 2020-09-10 LAB — CBC
HCT: 42.2 % (ref 39.0–52.0)
Hemoglobin: 13.6 g/dL (ref 13.0–17.0)
MCH: 31.4 pg (ref 26.0–34.0)
MCHC: 32.2 g/dL (ref 30.0–36.0)
MCV: 97.5 fL (ref 80.0–100.0)
Platelets: 330 10*3/uL (ref 150–400)
RBC: 4.33 MIL/uL (ref 4.22–5.81)
RDW: 15.1 % (ref 11.5–15.5)
WBC: 8.3 10*3/uL (ref 4.0–10.5)
nRBC: 0 % (ref 0.0–0.2)

## 2020-09-10 LAB — LIPASE, BLOOD: Lipase: 20 U/L (ref 11–51)

## 2020-09-10 MED ORDER — ALBUTEROL SULFATE HFA 108 (90 BASE) MCG/ACT IN AERS
2.0000 | INHALATION_SPRAY | Freq: Once | RESPIRATORY_TRACT | Status: AC
  Administered 2020-09-10: 2 via RESPIRATORY_TRACT
  Filled 2020-09-10: qty 6.7

## 2020-09-10 NOTE — ED Provider Notes (Signed)
[facility] EMERGENCY DEPARTMENT Provider Note   CSN: [id number] Arrival date & time: 09/10/20  0222     History Chief Complaint  Patient presents with  . URI    Dustin Cruz is a 54 y.o. male.  The history is provided by the patient.  URI Presenting symptoms: congestion and cough   Severity:  Mild Onset quality:  Gradual Timing:  Intermittent Progression:  Waxing and waning Chronicity:  New Relieved by:  Nothing Worsened by:  Nothing Patient reports cough congestion shortness of breath that started several hours ago.  No fevers reported    Past Medical History:  Diagnosis Date  . Anxiety   . Asthma   . CKD stage 3 due to type 2 diabetes mellitus ([place])   . COPD (chronic obstructive pulmonary disease) ([place])   . Coronary artery disease    s/p BMS to [place] 10/2010;  Cath 01/22/12 prox 30-40% LAD, LCx ramus w/ hazy 70-80% in-stent restenosis, EF 60% treated medically  . Coronary artery disease    s/p BMS to [facility] 10/2010;  Cath 01/22/12 prox 30-40% LAD, LCx ramus w/ hazy 70-80% in-stent restenosis, EF 60% treated medically   . Depression   . Diabetes mellitus    Type 2  . Gastroparesis 05/2013  . GERD (gastroesophageal reflux disease)   . HTN (hypertension)   . Hyperlipidemia   . Hyperlipidemia   . Major depression, chronic 08/13/2012  . Melanoma ([place]) 2007   surgery at [facility], Followed by [place]  . Myocardial infarction ([place]) 2012  . Obesity   . Tobacco abuse   . Tubular adenoma   . Urinary retention     Patient Active Problem List   Diagnosis Date Noted  . Acute diastolic CHF (congestive heart failure) ([place]) 07/23/2020  . Abnormal weight gain 07/23/2020  . Noncompliance 07/23/2020  . Pelvic lymphadenopathy 07/20/2020  . Acute on chronic diastolic CHF (congestive heart failure) ([place]) 06/30/2020  . CHF (congestive heart failure) ([place]) 06/29/2020  . Pre-ulcerative calluses 10/29/2019  . Musculoskeletal pain 10/13/2019  . Housing problems 05/23/2019  .  Essential hypertension, benign 03/20/2019  . 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 Near syncope 03/17/2019  . AKI (acute kidney injury) ([place]) 03/17/2019  . 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 CKD stage 3 due to type 2 diabetes mellitus ([place]) 11/28/2018  . Microalbuminuria due to type 2 diabetes mellitus ([place]) 05/22/2018  . Hyperglycemia 04/24/2018  . Abdominal pain, epigastric 11/29/2017  . Diarrhea 11/29/2017  . History of colonic polyps 11/29/2017  . Diffuse abdominal pain 11/29/2017  . Diabetic retinopathy ([place]) 09/21/2015  . Carotid bruit 09/21/2015  . Viral gastroenteritis 06/03/2015  . Hypertriglyceridemia 05/18/2015  . Vitamin D deficiency 12/29/2013  . Nausea alone 04/24/2013  . Frequency of urination 04/14/2013  . Fever, unspecified 04/14/2013  . Hepatic steatosis 12/28/2012  . Peripheral neuropathy 11/25/2012  . Melanoma of skin, site unspecified 11/25/2012  . Mixed hyperlipidemia   . Depression   . Hypertension associated with diabetes ([place])   . GERD (gastroesophageal reflux disease) 04/03/2012  . Current smoker 03/14/2012  . Type 2 diabetes mellitus with stage 3a chronic kidney disease, with long-term current use of insulin ([place])   . 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 BIOPSY  01/07/2018   Procedure: BIOPSY;  Surgeon: Daneil Dolin, MD;  Location: [facility] ENDO SUITE;  Service: Endoscopy;;  duodenal and gastric biopsy  . BIOPSY  04/25/2018   Procedure: BIOPSY;  Surgeon:  Daneil Dolin, MD;  Location: [facility] ENDO SUITE;  Service: Endoscopy;;  ascending and descending and sigmoid biopsies  . CARDIAC CATHETERIZATION     with stent  . COLONOSCOPY N/A 01/27/2013   GNF:AOZHYQ and colonic polyps. Tubular adenomas, poor bowel prep, one-year follow-up surveillance colonoscopy recommended  . COLONOSCOPY WITH PROPOFOL N/A 04/25/2018   Procedure: COLONOSCOPY WITH PROPOFOL;  Surgeon: Daneil Dolin, MD;  Location: [facility]  ENDO SUITE;  Service: Endoscopy;  Laterality: N/A;  11:00am - pt to be prepped INPT and labs to be done as well  . CORONARY ANGIOPLASTY WITH STENT PLACEMENT    . ESOPHAGOGASTRODUODENOSCOPY  04/24/2012   [place]-mild erosive reflux esophagitis,dilated w/43F Venia Minks, small HH, minimal chronic gastric/bulbar erosions(No H pylori)  . ESOPHAGOGASTRODUODENOSCOPY (EGD) WITH PROPOFOL N/A 01/07/2018   Dr. Gala Romney: Erythematous mucosa in the stomach, retained gastric contents, incomplete exam.  Biopsied showed mild chronic gastritis but no H. pylori.  Duodenal biopsy was negative for celiac disease.  [name] FLEXIBLE SIGMOIDOSCOPY N/A 01/07/2018   Dr. Gala Romney: Incomplete colonoscopy due to inadequate bowel prep  . LEFT HEART CATHETERIZATION WITH CORONARY ANGIOGRAM N/A 01/22/2012   Procedure: LEFT HEART CATHETERIZATION WITH CORONARY ANGIOGRAM;  Surgeon: Jolaine Artist, MD;  Location: [facility] CATH LAB;  Service: Cardiovascular;  Laterality: N/A;  . melanoma surgery  2007   [facility], removed lymph nodes under arm as well,Left abd       Family History  Problem Relation Age of Onset  . Stroke Mother   . Alcohol abuse Father   . Heart disease Other   . Other Other        not real familiar with family history  . Lung cancer Other   . Colon cancer Neg Hx   . Colon polyps Neg Hx     Social History   Tobacco Use  . Smoking status: Current Every Day Smoker    Packs/day: 1.00    Years: 31.00    [place] years: 31.00    Types: Cigarettes    Start date: 10/09/1981  . Smokeless tobacco: Never Used  . Tobacco comment: 1/2 [place] daily 11/01/17  Vaping Use  . Vaping Use: Never used  Substance Use Topics  . 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  . Drug use: No    Home Medications Prior to Admission medications   Medication Sig Start Date End Date Taking? Authorizing Provider  acetaminophen (TYLENOL)  325 MG tablet Take 2 tablets (650 mg total) by mouth every 6 (six) hours as needed for mild pain, moderate pain or fever (or Fever >/= 101). 03/21/19   Roxan Hockey, MD  albuterol (PROVENTIL) (2.5 MG/3ML) 0.083% nebulizer solution Take 3 mLs (2.5 mg total) by nebulization every 6 (six) hours as needed for wheezing or shortness of breath. Dx---44.1 02/16/20   [name], DO  albuterol (VENTOLIN HFA) 108 (90 Base) MCG/ACT inhaler 2 puffs every 6 hours prn 07/01/20   Ronnie Doss M, DO  apixaban (ELIQUIS) 5 MG TABS tablet Take 1 tablet (5 mg total) by mouth 2 (two) times daily. 07/16/19   Strader, Fransisco Hertz, PA-C  aspirin EC 81 MG tablet Take 1 tablet (81 mg total) by mouth daily with breakfast. 03/21/19   Roxan Hockey, MD  atorvastatin (LIPITOR) 80 MG tablet Take 1 tablet (80 mg total) by mouth daily. 07/01/20   [name], DO  Blood Glucose Monitoring Suppl (ACCU-CHEK GUIDE) w/Device KIT 1 Piece by Does not apply route as directed. 06/30/20   Claretta Fraise, MD  busPIRone (BUSPAR) 7.5 MG tablet Take 1 tablet by mouth twice daily 09/06/20   Gottschalk, Ashly M, DO  ENTRESTO 24-26 MG Take 1 tablet by mouth 2 (two) times daily. 08/01/20   [provider]  famotidine (PEPCID) 20 MG tablet Take 20 mg by mouth 2 (two) times daily. 12/22/19   [provider]  furosemide (LASIX) 40 MG tablet Take 1 tablet (40 mg total) by mouth 2 (two) times daily. 09/01/20   Ripley Fraise, MD  gabapentin (NEURONTIN) 600 MG tablet Take 1 tablet (600 mg total) by mouth 3 (three) times daily. 07/01/20   [name], DO  glucose blood (ACCU-CHEK GUIDE) test strip Use as instructed 07/01/20   Ronnie Doss M, DO  insulin glargine (LANTUS SOLOSTAR) 100 UNIT/ML Solostar Pen Inject 60 Units into the skin every evening. 07/01/20   Ronnie Doss M, DO  lisinopril (ZESTRIL) 10 MG tablet Take 1 tablet (10 mg total) by mouth at bedtime. 07/01/20   [name], DO  lisinopril  (ZESTRIL) 5 MG tablet Take 5 mg by mouth at bedtime. 07/08/20   [provider]  metFORMIN (GLUCOPHAGE) 500 MG tablet Take 1 tablet (500 mg total) by mouth 2 (two) times daily with a meal. 11/12/19   Nida, Marella Chimes, MD  metoprolol succinate (TOPROL-XL) 25 MG 24 hr tablet Take 12.5 mg by mouth daily. 07/30/20   [provider]  mometasone-formoterol (DULERA) 200-5 MCG/ACT AERO Inhale 2 puffs into the lungs in the morning and at bedtime. 07/20/20   [name], DO  nitroGLYCERIN (NITROSTAT) 0.4 MG SL tablet Place 1 tablet (0.4 mg total) under the tongue every 5 (five) minutes as needed. For chest pain. 08/13/12   Reece Packer, NP  tiZANidine (ZANAFLEX) 4 MG tablet Take 4-8 mg by mouth every 6 (six) hours as needed for muscle spasms.    [provider]  traZODone (DESYREL) 150 MG tablet TAKE 1 TABLET BY MOUTH ONCE DAILY AT BEDTIME 08/31/20   Ronnie Doss M, DO  metoprolol tartrate (LOPRESSOR) 25 MG tablet Take 25 mg by mouth 2 (two) times daily.    12/27/11  [provider]    Allergies    Patient has no known allergies.  Review of Systems   Review of Systems  HENT: Positive for congestion.   Respiratory: Positive for cough.     Physical Exam Updated Vital Signs BP 120/66 (BP Location: Left Arm)   Pulse 97   Temp 97.9 F (36.6 C) (Oral)   Resp 18   Ht 1.88 m (_0 )   Wt 97.1 kg   SpO2 97%   BMI 27.48 kg/m   Physical Exam CONSTITUTIONAL: Disheveled walk around the ER HEAD: Normocephalic/atraumatic EYES: EOMI NECK: supple no meningeal signs CV: S1/S2 noted, no murmurs/rubs/gallops noted LUNGS: Lungs are clear to auscultation bilaterally, no apparent distress ABDOMEN: soft NEURO: Pt is awake/alert/appropriate, moves all extremitiesx4.  No facial droop.  Walking around the ER EXTREMITIES:  full ROM SKIN: warm, color normal PSYCH: no abnormalities of mood noted, alert and oriented to situation  ED Results / Procedures /  Treatments   Labs (all labs ordered are listed, but only abnormal results are displayed) Labs Reviewed - No data to display  EKG None  Radiology No results found.  Procedures Procedures (including critical care time)  Medications Ordered in ED Medications  albuterol (VENTOLIN HFA) 108 (90 Base) MCG/ACT inhaler 2 puff (2 puffs Inhalation Given 09/10/20 0301)    ED Course  I have reviewed the triage vital signs and the nursing notes.      MDM Rules/Calculators/A&P                          Patient reports he came to ER just to get albuterol.  He is now feeling improved and requesting discharge Final Clinical Impression(s) / ED Diagnoses Final diagnoses:  Cough    Rx / DC Orders ED Discharge Orders    None       Ripley Fraise, MD 09/10/20 [phone]

## 2020-09-10 NOTE — ED Triage Notes (Signed)
Pt states he is here for SOB r/t allergies. States he feels like he needs a breathing treatment.

## 2020-09-10 NOTE — ED Triage Notes (Signed)
Pt with abd pain and bloating.  States urine is dark in color noted today.

## 2020-09-10 NOTE — ED Notes (Signed)
PT states "I'm not sitting in here all night take these stickers off me so I can go, I don't drive good after dark." Pt was educated on risk associated with leaving prior to being seen by medical provider and completion of plan of care. PT verbalized complete understanding and denies questions at this time. Pt had no IV and self ambulated to exit with steady gait.

## 2020-09-15 ENCOUNTER — Emergency Department (HOSPITAL_COMMUNITY)
Admission: EM | Admit: 2020-09-15 | Discharge: 2020-09-15 | Disposition: A | Discharge status: Home / Self Care | Payer: 59 | Attending: Emergency Medicine | Admitting: Emergency Medicine

## 2020-09-15 ENCOUNTER — Encounter (HOSPITAL_COMMUNITY): Payer: Self-pay | Admitting: Emergency Medicine

## 2020-09-15 ENCOUNTER — Other Ambulatory Visit: Payer: Self-pay

## 2020-09-15 ENCOUNTER — Emergency Department (HOSPITAL_COMMUNITY): Payer: 59

## 2020-09-15 DIAGNOSIS — E1122 Type 2 diabetes mellitus with diabetic chronic kidney disease: Secondary | ICD-10-CM | POA: Diagnosis not present

## 2020-09-15 DIAGNOSIS — J45909 Unspecified asthma, uncomplicated: Secondary | ICD-10-CM | POA: Insufficient documentation

## 2020-09-15 DIAGNOSIS — Z7951 Long term (current) use of inhaled steroids: Secondary | ICD-10-CM | POA: Insufficient documentation

## 2020-09-15 DIAGNOSIS — I2510 Atherosclerotic heart disease of native coronary artery without angina pectoris: Secondary | ICD-10-CM | POA: Diagnosis not present

## 2020-09-15 DIAGNOSIS — I5033 Acute on chronic diastolic (congestive) heart failure: Secondary | ICD-10-CM | POA: Diagnosis not present

## 2020-09-15 DIAGNOSIS — Z7901 Long term (current) use of anticoagulants: Secondary | ICD-10-CM | POA: Insufficient documentation

## 2020-09-15 DIAGNOSIS — F17210 Nicotine dependence, cigarettes, uncomplicated: Secondary | ICD-10-CM | POA: Diagnosis not present

## 2020-09-15 DIAGNOSIS — N289 Disorder of kidney and ureter, unspecified: Secondary | ICD-10-CM

## 2020-09-15 DIAGNOSIS — I130 Hypertensive heart and chronic kidney disease with heart failure and stage 1 through stage 4 chronic kidney disease, or unspecified chronic kidney disease: Secondary | ICD-10-CM | POA: Diagnosis not present

## 2020-09-15 DIAGNOSIS — Z7982 Long term (current) use of aspirin: Secondary | ICD-10-CM | POA: Diagnosis not present

## 2020-09-15 DIAGNOSIS — N183 Chronic kidney disease, stage 3 unspecified: Secondary | ICD-10-CM | POA: Insufficient documentation

## 2020-09-15 DIAGNOSIS — J449 Chronic obstructive pulmonary disease, unspecified: Secondary | ICD-10-CM | POA: Insufficient documentation

## 2020-09-15 DIAGNOSIS — R601 Generalized edema: Secondary | ICD-10-CM | POA: Diagnosis not present

## 2020-09-15 DIAGNOSIS — R748 Abnormal levels of other serum enzymes: Secondary | ICD-10-CM | POA: Insufficient documentation

## 2020-09-15 DIAGNOSIS — Z7984 Long term (current) use of oral hypoglycemic drugs: Secondary | ICD-10-CM | POA: Diagnosis not present

## 2020-09-15 DIAGNOSIS — Z79899 Other long term (current) drug therapy: Secondary | ICD-10-CM | POA: Diagnosis not present

## 2020-09-15 DIAGNOSIS — Z794 Long term (current) use of insulin: Secondary | ICD-10-CM | POA: Diagnosis not present

## 2020-09-15 DIAGNOSIS — M7989 Other specified soft tissue disorders: Secondary | ICD-10-CM | POA: Diagnosis present

## 2020-09-15 DIAGNOSIS — E871 Hypo-osmolality and hyponatremia: Secondary | ICD-10-CM | POA: Diagnosis not present

## 2020-09-15 LAB — CBC WITH DIFFERENTIAL/PLATELET
Abs Immature Granulocytes: 0.03 10*3/uL (ref 0.00–0.07)
Basophils Absolute: 0.1 10*3/uL (ref 0.0–0.1)
Basophils Relative: 1 %
Eosinophils Absolute: 0.1 10*3/uL (ref 0.0–0.5)
Eosinophils Relative: 1 %
HCT: 40.8 % (ref 39.0–52.0)
Hemoglobin: 13 g/dL (ref 13.0–17.0)
Immature Granulocytes: 0 %
Lymphocytes Relative: 19 %
Lymphs Abs: 1.3 10*3/uL (ref 0.7–4.0)
MCH: 31.4 pg (ref 26.0–34.0)
MCHC: 31.9 g/dL (ref 30.0–36.0)
MCV: 98.6 fL (ref 80.0–100.0)
Monocytes Absolute: 0.7 10*3/uL (ref 0.1–1.0)
Monocytes Relative: 10 %
Neutro Abs: 4.9 10*3/uL (ref 1.7–7.7)
Neutrophils Relative %: 69 %
Platelets: 403 10*3/uL — ABNORMAL HIGH (ref 150–400)
RBC: 4.14 MIL/uL — ABNORMAL LOW (ref 4.22–5.81)
RDW: 15.3 % (ref 11.5–15.5)
WBC: 7 10*3/uL (ref 4.0–10.5)
nRBC: 0 % (ref 0.0–0.2)

## 2020-09-15 LAB — URINALYSIS, ROUTINE W REFLEX MICROSCOPIC
Bilirubin Urine: NEGATIVE
Glucose, UA: >=500 mg/dL — AB
Ketones, ur: NEGATIVE mg/dL
Leukocytes,Ua: NEGATIVE
Nitrite: NEGATIVE
Protein, ur: >=300 mg/dL — AB
Specific Gravity, Urine: 1.024 (ref 1.005–1.030)
pH: 5 (ref 5.0–8.0)

## 2020-09-15 LAB — COMPREHENSIVE METABOLIC PANEL
ALT: 43 U/L (ref 0–44)
AST: 35 U/L (ref 15–41)
Albumin: 2.9 g/dL — ABNORMAL LOW (ref 3.5–5.0)
Alkaline Phosphatase: 176 U/L — ABNORMAL HIGH (ref 38–126)
Anion gap: 10 (ref 5–15)
BUN: 21 mg/dL — ABNORMAL HIGH (ref 6–20)
CO2: 30 mmol/L (ref 22–32)
Calcium: 8.8 mg/dL — ABNORMAL LOW (ref 8.9–10.3)
Chloride: 90 mmol/L — ABNORMAL LOW (ref 98–111)
Creatinine, Ser: 1.25 mg/dL — ABNORMAL HIGH (ref 0.61–1.24)
GFR, Estimated: >60 mL/min (ref >60)
Glucose, Bld: 413 mg/dL — ABNORMAL HIGH (ref 70–99)
Potassium: 4.2 mmol/L (ref 3.5–5.1)
Sodium: 130 mmol/L — ABNORMAL LOW (ref 135–145)
Total Bilirubin: 0.6 mg/dL (ref 0.3–1.2)
Total Protein: 6.8 g/dL (ref 6.5–8.1)

## 2020-09-15 LAB — BRAIN NATRIURETIC PEPTIDE: B Natriuretic Peptide: 1630 pg/mL — ABNORMAL HIGH (ref 0.0–100.0)

## 2020-09-15 MED ORDER — FUROSEMIDE 10 MG/ML IJ SOLN
40.0000 mg | Freq: Once | INTRAMUSCULAR | Status: DC
  Filled 2020-09-15: qty 4

## 2020-09-15 MED ORDER — FUROSEMIDE 80 MG PO TABS: 80.0000 mg | ORAL_TABLET | Freq: Two times a day (BID) | ORAL | 0 refills | Status: DC

## 2020-09-15 NOTE — ED Provider Notes (Signed)
 EMERGENCY DEPARTMENT Provider Note   CSN: [id number] Arrival date & time: 09/15/20  1845   History Chief Complaint  Patient presents with  . Groin Swelling    Dustin Cruz is a 54 y.o. male.  The history is provided by the patient.  He has history of hypertension, diabetes, hyperlipidemia, chronic kidney disease stage III, coronary artery disease, diastolic heart failure, COPD and comes in complaining of swelling in his legs and groin over the last several days which is getting progressively worse.  He has also noticed some exertional dyspnea.  He denies any chest pain, heaviness, tightness, pressure.  He states he has been taking his furosemide 3 times a day, but is retaining fluid in spite of that.  He has told me that he does not want to stay in the hospital.  Past Medical History:  Diagnosis Date  . Anxiety   . Asthma   . CKD stage 3 due to type 2 diabetes mellitus (HCC)   . COPD (chronic obstructive pulmonary disease) (HCC)   . Coronary artery disease    s/p BMS to Ramus 10/2010;  Cath 01/22/12 prox 30-40% LAD, LCx ramus w/ hazy 70-80% in-stent restenosis, EF 60% treated medically  . Coronary artery disease    s/p BMS to [facility] 10/2010;  Cath 01/22/12 prox 30-40% LAD, LCx ramus w/ hazy 70-80% in-stent restenosis, EF 60% treated medically   . Depression   . Diabetes mellitus    Type 2  . Gastroparesis 05/2013  . GERD (gastroesophageal reflux disease)   . HTN (hypertension)   . Hyperlipidemia   . Hyperlipidemia   . Major depression, chronic 08/13/2012  . Melanoma (HCC) 2007   surgery at [facility], Followed by [facility]  . Myocardial infarction (HCC) 2012  . Obesity   . Tobacco abuse   . Tubular adenoma   . Urinary retention     Patient Active Problem List   Diagnosis Date Noted  . Acute diastolic CHF (congestive heart failure) (HCC) 07/23/2020  . Abnormal weight gain 07/23/2020  . Noncompliance 07/23/2020  . Pelvic lymphadenopathy 07/20/2020  . Acute on  chronic diastolic CHF (congestive heart failure) (HCC) 06/30/2020  . CHF (congestive heart failure) (HCC) 06/29/2020  . Pre-ulcerative calluses 10/29/2019  . Musculoskeletal pain 10/13/2019  . Housing problems 05/23/2019  . Essential hypertension, benign 03/20/2019  . 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 Near syncope 03/17/2019  . AKI (acute kidney injury) (HCC) 03/17/2019  . 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 CKD stage 3 due to type 2 diabetes mellitus (HCC) 11/28/2018  . Microalbuminuria due to type 2 diabetes mellitus (HCC) 05/22/2018  . Hyperglycemia 04/24/2018  . Abdominal pain, epigastric 11/29/2017  . Diarrhea 11/29/2017  . History of colonic polyps 11/29/2017  . Diffuse abdominal pain 11/29/2017  . Diabetic retinopathy (HCC) 09/21/2015  . Carotid bruit 09/21/2015  . Viral gastroenteritis 06/03/2015  . Hypertriglyceridemia 05/18/2015  . Vitamin D deficiency 12/29/2013  . Nausea alone 04/24/2013  . Frequency of urination 04/14/2013  . Fever, unspecified 04/14/2013  . Hepatic steatosis 12/28/2012  . Peripheral neuropathy 11/25/2012  . Melanoma of skin, site unspecified 11/25/2012  . Mixed hyperlipidemia   . Depression   . Hypertension associated with diabetes (HCC)   . GERD (gastroesophageal reflux disease) 04/03/2012  . Current smoker 03/14/2012  . Type 2 diabetes mellitus with stage 3a chronic kidney disease, with long-term current use of insulin (HCC)   . Arteriosclerotic cardiovascular disease (ASCVD) 01/06/2011    Past Surgical History:  Procedure Laterality   Date  . BIOPSY  01/07/2018   Procedure: BIOPSY;  Surgeon: Daneil Dolin, MD;  Location: [facility];  Service: Endoscopy;;  duodenal and gastric biopsy  . BIOPSY  04/25/2018   Procedure: BIOPSY;  Surgeon: Daneil Dolin, MD;  Location: [facility];  Service: Endoscopy;;  ascending and descending and sigmoid biopsies  . CARDIAC CATHETERIZATION     with stent  . COLONOSCOPY N/A 01/27/2013   [id number] and colonic  polyps. Tubular adenomas, poor bowel prep, one-year follow-up surveillance colonoscopy recommended  . COLONOSCOPY WITH PROPOFOL N/A 04/25/2018   Procedure: COLONOSCOPY WITH PROPOFOL;  Surgeon: Daneil Dolin, MD;  Location: [facility];  Service: Endoscopy;  Laterality: N/A;  11:00am - pt to be prepped INPT and labs to be done as well  . CORONARY ANGIOPLASTY WITH STENT PLACEMENT    . ESOPHAGOGASTRODUODENOSCOPY  04/24/2012   Rourk-mild erosive reflux esophagitis,dilated w/49F Venia Minks, small HH, minimal chronic gastric/bulbar erosions(No H pylori)  . ESOPHAGOGASTRODUODENOSCOPY (EGD) WITH PROPOFOL N/A 01/07/2018   Dr. Gala Romney: Erythematous mucosa in the stomach, retained gastric contents, incomplete exam.  Biopsied showed mild chronic gastritis but no H. pylori.  Duodenal biopsy was negative for celiac disease.  [name] FLEXIBLE SIGMOIDOSCOPY N/A 01/07/2018   Dr. Gala Romney: Incomplete colonoscopy due to inadequate bowel prep  . LEFT HEART CATHETERIZATION WITH CORONARY ANGIOGRAM N/A 01/22/2012   Procedure: LEFT HEART CATHETERIZATION WITH CORONARY ANGIOGRAM;  Surgeon: Jolaine Artist, MD;  Location: [facility] CATH LAB;  Service: Cardiovascular;  Laterality: N/A;  . melanoma surgery  2007   [place], removed lymph nodes under arm as well,Left abd       Family History  Problem Relation Age of Onset  . Stroke Mother   . Alcohol abuse Father   . Heart disease Other   . Other Other        not real familiar with family history  . Lung cancer Other   . Colon cancer Neg Hx   . Colon polyps Neg Hx     Social History   Tobacco Use  . Smoking status: Current Every Day Smoker    Packs/day: 1.00    Years: 31.00    Cathy years: 31.00    Types: Cigarettes    Start date: 10/09/1981  . Smokeless tobacco: Never Used  . Tobacco comment: 1/2 Dapper daily 11/01/17  Vaping Use  . Vaping Use: Never used  Substance Use Topics  . 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  . Drug use: No    Home Medications Prior to Admission medications   Medication Sig Start Date End Date Taking? Authorizing Provider  acetaminophen (TYLENOL) 325 MG tablet Take 2 tablets (650 mg total) by mouth every 6 (six) hours as needed for mild pain, moderate pain or fever (or Fever >/= 101). 03/21/19   Roxan Hockey, MD  albuterol (PROVENTIL) (2.5 MG/3ML) 0.083% nebulizer solution Take 3 mLs (2.5 mg total) by nebulization every 6 (six) hours as needed for wheezing or shortness of breath. Dx---44.1 02/16/20   [name], DO  albuterol (VENTOLIN HFA) 108 (90 Base) MCG/ACT inhaler 2 puffs every 6 hours prn 07/01/20   Ronnie Doss M, DO  apixaban (ELIQUIS) 5 MG TABS tablet Take 1 tablet (5 mg total) by mouth 2 (two) times daily. 07/16/19   Strader, Fransisco Hertz, PA-C  aspirin EC 81 MG  tablet Take 1 tablet (81 mg total) by mouth daily with breakfast. 03/21/19   Emokpae, Courage, MD  atorvastatin (LIPITOR) 80 MG tablet Take 1 tablet (80 mg total) by mouth daily. 07/01/20   Gottschalk, Ashly M, DO  Blood Glucose Monitoring Suppl (ACCU-CHEK GUIDE) w/Device KIT 1 Piece by Does not apply route as directed. 06/30/20   Stacks, Warren, MD  busPIRone (BUSPAR) 7.5 MG tablet Take 1 tablet by mouth twice daily 09/06/20   Gottschalk, Ashly M, DO  ENTRESTO 24-26 MG Take 1 tablet by mouth 2 (two) times daily. 08/01/20   [provider]  famotidine (PEPCID) 20 MG tablet Take 20 mg by mouth 2 (two) times daily. 12/22/19   [provider]  furosemide (LASIX) 40 MG tablet Take 1 tablet (40 mg total) by mouth 2 (two) times daily. 09/01/20   Wickline, Donald, MD  gabapentin (NEURONTIN) 600 MG tablet Take 1 tablet (600 mg total) by mouth 3 (three) times daily. 07/01/20   Gottschalk, Ashly M, DO  glucose blood (ACCU-CHEK GUIDE) test strip Use as instructed 07/01/20   Gottschalk, Ashly M, DO  insulin glargine (LANTUS SOLOSTAR)  100 UNIT/ML Solostar Pen Inject 60 Units into the skin every evening. 07/01/20   Gottschalk, Ashly M, DO  lisinopril (ZESTRIL) 10 MG tablet Take 1 tablet (10 mg total) by mouth at bedtime. 07/01/20   Gottschalk, Ashly M, DO  lisinopril (ZESTRIL) 5 MG tablet Take 5 mg by mouth at bedtime. 07/08/20   [provider]  metFORMIN (GLUCOPHAGE) 500 MG tablet Take 1 tablet (500 mg total) by mouth 2 (two) times daily with a meal. 11/12/19   Nida, Gebreselassie W, MD  metoprolol succinate (TOPROL-XL) 25 MG 24 hr tablet Take 12.5 mg by mouth daily. 07/30/20   [provider]  mometasone-formoterol (DULERA) 200-5 MCG/ACT AERO Inhale 2 puffs into the lungs in the morning and at bedtime. 07/20/20   Gottschalk, Ashly M, DO  nitroGLYCERIN (NITROSTAT) 0.4 MG SL tablet Place 1 tablet (0.4 mg total) under the tongue every 5 (five) minutes as needed. For chest pain. 08/13/12   Squires, Kimberly, NP  tiZANidine (ZANAFLEX) 4 MG tablet Take 4-8 mg by mouth every 6 (six) hours as needed for muscle spasms.    [provider]  traZODone (DESYREL) 150 MG tablet TAKE 1 TABLET BY MOUTH ONCE DAILY AT BEDTIME 08/31/20   Gottschalk, Ashly M, DO  metoprolol tartrate (LOPRESSOR) 25 MG tablet Take 25 mg by mouth 2 (two) times daily.    12/27/11  [provider]    Allergies    Patient has no known allergies.  Review of Systems   Review of Systems  All other systems reviewed and are negative.   Physical Exam Updated Vital Signs BP (!) 139/102 (BP Location: Right Arm)   Pulse 98   Temp 97.8 F (36.6 C) (Oral)   Resp 20   Ht 6' 2" (1.88 m)   Wt 97.1 kg   SpO2 100%   BMI 27.48 kg/m   Physical Exam Vitals and nursing note reviewed.   54 year old male, resting comfortably and in no acute distress. Vital signs are significant for elevated blood pressure. Oxygen saturation is 100%, which is normal. Head is normocephalic and atraumatic. PERRLA, EOMI. Oropharynx is clear. Neck is nontender and  supple without adenopathy or JVD. Back is nontender and there is no CVA tenderness.  There is 1+ presacral edema. Lungs are clear without rales, wheezes, or rhonchi. Chest is nontender. Heart has regular   rate and rhythm without murmur. Abdomen is soft, flat, nontender without masses or hepatosplenomegaly and peristalsis is normoactive.  There is trace edema of the abdominal wall. Genitalia: Uncircumcised penis.  Moderate to severe scrotal and penile edema. Extremities have 2+ pretibial edema, full range of motion is present. Skin is warm and dry without rash. Neurologic: Mental status is normal, cranial nerves are intact, there are no motor or sensory deficits.  ED Results / Procedures / Treatments   Labs (all labs ordered are listed, but only abnormal results are displayed) Labs Reviewed  COMPREHENSIVE METABOLIC PANEL - Abnormal; Notable for the following components:      Result Value   Sodium 130 (*)    Chloride 90 (*)    Glucose, Bld 413 (*)    BUN 21 (*)    Creatinine, Ser 1.25 (*)    Calcium 8.8 (*)    Albumin 2.9 (*)    Alkaline Phosphatase 176 (*)    All other components within normal limits  BRAIN NATRIURETIC PEPTIDE - Abnormal; Notable for the following components:   B Natriuretic Peptide 1,630.0 (*)    All other components within normal limits  CBC WITH DIFFERENTIAL/PLATELET - Abnormal; Notable for the following components:   RBC 4.14 (*)    Platelets 403 (*)    All other components within normal limits  URINALYSIS, ROUTINE W REFLEX MICROSCOPIC    Radiology DG Chest Port 1 View  Result Date: 09/15/2020 CLINICAL DATA:  Peripheral edema EXAM: PORTABLE CHEST 1 VIEW COMPARISON:  September 11, 2020 FINDINGS: The heart size and mediastinal contours are mildly enlarged. There is prominence of the central pulmonary vasculature. Both lungs are otherwise clear. The visualized skeletal structures are unremarkable. IMPRESSION: Mild cardiomegaly and pulmonary vascular congestion  Electronically Signed   By: Bindu  Avutu M.D.   On: 09/15/2020 19:46    Procedures Procedures  Medications Ordered in ED Medications - No data to display  ED Course  I have reviewed the triage vital signs and the nursing notes.  Pertinent labs & imaging results that were available during my care of the patient were reviewed by me and considered in my medical decision making (see chart for details).  MDM Rules/Calculators/A&P Exacerbation of diastolic heart failure with anasarca.  Old records are reviewed, and he has numerous ED visits and a hospitalization for the same complaints over the last several months.  On several occasions, he has left AGAINST MEDICAL ADVICE.  Will check screening labs and give IV furosemide.  Patient refused IV and refused parenteral furosemide.  Chest x-ray is significant for findings of heart failure.  Labs shows stable renal insufficiency, hyponatremia which is essentially unchanged from prior.  Alkaline phosphatase is elevated and unchanged.  BNP is markedly elevated consistent with his clinical picture.  Since he does not want to be admitted and will not accept parenteral diuretics, I am increasing his furosemide dose to 80 mg twice a day.  He is instructed to follow-up with his cardiologist in 1 week, return to the emergency department at anytime if he changes his mind about excepting parenteral medications.  Final Clinical Impression(s) / ED Diagnoses Final diagnoses:  Anasarca  Acute on chronic diastolic heart failure (HCC)  Renal insufficiency  Elevated alkaline phosphatase level  Chronic anticoagulation  Hyponatremia    Rx / DC Orders ED Discharge Orders         Ordered    furosemide (LASIX) 80 MG tablet  2 times daily          09/15/20 2109           Glick, David, MD 09/15/20 2114  

## 2020-09-15 NOTE — ED Notes (Signed)
Patient refusing to have cardiac monitoring and EKG done. EDP Dr Roxanne Mins notified at this time.

## 2020-09-15 NOTE — ED Notes (Addendum)
Pre void scan: >200 ml Voided: 250 ml Post void bladder scan: 16 ml

## 2020-09-15 NOTE — ED Triage Notes (Signed)
Pt c/o testicular swelling that began today.

## 2020-09-15 NOTE — ED Notes (Signed)
Bladder scan >200. Pt attempting to void now.

## 2020-09-15 NOTE — Discharge Instructions (Addendum)
You did not allow me to give you any of the furosemide through an IV.  It works much better when given that way.  If you change your mind about letting us put an IV and give you the medication, please return and we will resume your care at that point.  Please make sure to stay on a low-salt diet.  It will help keep fluid from building up.  Please follow-up with your cardiologist you can monitor your response to furosemide and adjust the dose as needed.

## 2020-09-19 ENCOUNTER — Encounter (HOSPITAL_COMMUNITY): Payer: Self-pay | Admitting: Emergency Medicine

## 2020-09-19 ENCOUNTER — Emergency Department (HOSPITAL_COMMUNITY)
Admission: EM | Admit: 2020-09-19 | Discharge: 2020-09-19 | Disposition: A | Discharge status: Home / Self Care | Payer: 59 | Attending: Emergency Medicine | Admitting: Emergency Medicine

## 2020-09-19 ENCOUNTER — Other Ambulatory Visit: Payer: Self-pay

## 2020-09-19 DIAGNOSIS — E1165 Type 2 diabetes mellitus with hyperglycemia: Secondary | ICD-10-CM | POA: Insufficient documentation

## 2020-09-19 DIAGNOSIS — F17210 Nicotine dependence, cigarettes, uncomplicated: Secondary | ICD-10-CM | POA: Insufficient documentation

## 2020-09-19 DIAGNOSIS — I130 Hypertensive heart and chronic kidney disease with heart failure and stage 1 through stage 4 chronic kidney disease, or unspecified chronic kidney disease: Secondary | ICD-10-CM | POA: Diagnosis not present

## 2020-09-19 DIAGNOSIS — R600 Localized edema: Secondary | ICD-10-CM | POA: Insufficient documentation

## 2020-09-19 DIAGNOSIS — Z7901 Long term (current) use of anticoagulants: Secondary | ICD-10-CM | POA: Diagnosis not present

## 2020-09-19 DIAGNOSIS — R1909 Other intra-abdominal and pelvic swelling, mass and lump: Secondary | ICD-10-CM | POA: Diagnosis present

## 2020-09-19 DIAGNOSIS — Z7951 Long term (current) use of inhaled steroids: Secondary | ICD-10-CM | POA: Diagnosis not present

## 2020-09-19 DIAGNOSIS — Z955 Presence of coronary angioplasty implant and graft: Secondary | ICD-10-CM | POA: Insufficient documentation

## 2020-09-19 DIAGNOSIS — J449 Chronic obstructive pulmonary disease, unspecified: Secondary | ICD-10-CM | POA: Insufficient documentation

## 2020-09-19 DIAGNOSIS — Z794 Long term (current) use of insulin: Secondary | ICD-10-CM | POA: Insufficient documentation

## 2020-09-19 DIAGNOSIS — I2510 Atherosclerotic heart disease of native coronary artery without angina pectoris: Secondary | ICD-10-CM | POA: Diagnosis not present

## 2020-09-19 DIAGNOSIS — I5033 Acute on chronic diastolic (congestive) heart failure: Secondary | ICD-10-CM | POA: Insufficient documentation

## 2020-09-19 DIAGNOSIS — Z7984 Long term (current) use of oral hypoglycemic drugs: Secondary | ICD-10-CM | POA: Diagnosis not present

## 2020-09-19 DIAGNOSIS — E1122 Type 2 diabetes mellitus with diabetic chronic kidney disease: Secondary | ICD-10-CM | POA: Insufficient documentation

## 2020-09-19 DIAGNOSIS — Z79899 Other long term (current) drug therapy: Secondary | ICD-10-CM | POA: Diagnosis not present

## 2020-09-19 DIAGNOSIS — R739 Hyperglycemia, unspecified: Secondary | ICD-10-CM

## 2020-09-19 DIAGNOSIS — R609 Edema, unspecified: Secondary | ICD-10-CM

## 2020-09-19 DIAGNOSIS — N183 Chronic kidney disease, stage 3 unspecified: Secondary | ICD-10-CM | POA: Diagnosis not present

## 2020-09-19 DIAGNOSIS — Z7982 Long term (current) use of aspirin: Secondary | ICD-10-CM | POA: Insufficient documentation

## 2020-09-19 LAB — COMPREHENSIVE METABOLIC PANEL
ALT: 39 U/L (ref 0–44)
AST: 34 U/L (ref 15–41)
Albumin: 2.9 g/dL — ABNORMAL LOW (ref 3.5–5.0)
Alkaline Phosphatase: 159 U/L — ABNORMAL HIGH (ref 38–126)
Anion gap: 8 (ref 5–15)
BUN: 19 mg/dL (ref 6–20)
CO2: 33 mmol/L — ABNORMAL HIGH (ref 22–32)
Calcium: 8.9 mg/dL (ref 8.9–10.3)
Chloride: 84 mmol/L — ABNORMAL LOW (ref 98–111)
Creatinine, Ser: 1.42 mg/dL — ABNORMAL HIGH (ref 0.61–1.24)
GFR, Estimated: 58 mL/min — ABNORMAL LOW (ref >60)
Glucose, Bld: 513 mg/dL (ref 70–99)
Potassium: 4.2 mmol/L (ref 3.5–5.1)
Sodium: 125 mmol/L — ABNORMAL LOW (ref 135–145)
Total Bilirubin: 0.7 mg/dL (ref 0.3–1.2)
Total Protein: 6.9 g/dL (ref 6.5–8.1)

## 2020-09-19 LAB — CBC WITH DIFFERENTIAL/PLATELET
Abs Immature Granulocytes: 0.02 10*3/uL (ref 0.00–0.07)
Basophils Absolute: 0.1 10*3/uL (ref 0.0–0.1)
Basophils Relative: 1 %
Eosinophils Absolute: 0.1 10*3/uL (ref 0.0–0.5)
Eosinophils Relative: 1 %
HCT: 41.8 % (ref 39.0–52.0)
Hemoglobin: 13.7 g/dL (ref 13.0–17.0)
Immature Granulocytes: 0 %
Lymphocytes Relative: 19 %
Lymphs Abs: 1.4 10*3/uL (ref 0.7–4.0)
MCH: 31.6 pg (ref 26.0–34.0)
MCHC: 32.8 g/dL (ref 30.0–36.0)
MCV: 96.5 fL (ref 80.0–100.0)
Monocytes Absolute: 0.8 10*3/uL (ref 0.1–1.0)
Monocytes Relative: 11 %
Neutro Abs: 4.9 10*3/uL (ref 1.7–7.7)
Neutrophils Relative %: 68 %
Platelets: 443 10*3/uL — ABNORMAL HIGH (ref 150–400)
RBC: 4.33 MIL/uL (ref 4.22–5.81)
RDW: 14.6 % (ref 11.5–15.5)
WBC: 7.4 10*3/uL (ref 4.0–10.5)
nRBC: 0 % (ref 0.0–0.2)

## 2020-09-19 LAB — CBG MONITORING, ED: Glucose-Capillary: 383 mg/dL — ABNORMAL HIGH (ref 70–99)

## 2020-09-19 LAB — BRAIN NATRIURETIC PEPTIDE: B Natriuretic Peptide: 1435 pg/mL — ABNORMAL HIGH (ref 0.0–100.0)

## 2020-09-19 MED ORDER — FUROSEMIDE 10 MG/ML IJ SOLN
80.0000 mg | Freq: Once | INTRAMUSCULAR | Status: AC
  Administered 2020-09-19: 06:00:00 80 mg via INTRAVENOUS
  Filled 2020-09-19: qty 8

## 2020-09-19 MED ORDER — INSULIN ASPART 100 UNIT/ML IV SOLN
10.0000 [IU] | Freq: Once | INTRAVENOUS | Status: AC
  Administered 2020-09-19: 07:00:00 10 [IU] via INTRAVENOUS

## 2020-09-19 NOTE — ED Notes (Signed)
Pt refusing to [facility] ED Staff to obtain EKG at this time. EDP Notified and aware. Pt has allowed IV Access to be established, blood work drawn and aso agreed to receive IV Lasix at this time.

## 2020-09-19 NOTE — ED Notes (Signed)
ED Provider at bedside. 

## 2020-09-19 NOTE — ED Provider Notes (Signed)
8:22 AM-he has indicated to nursing staff that he is ready to go home.  CBG is improving after intravenous insulin.  Overall he appears to be at his baseline.  Vital signs on admission this morning were normal.   Daleen Bo, MD 09/19/20 [phone]

## 2020-09-19 NOTE — ED Provider Notes (Signed)
[facility] EMERGENCY DEPARTMENT Provider Note   CSN: [id number] Arrival date & time: 09/19/20  0536     History Chief Complaint  Patient presents with  . Groin Swelling    Dustin Cruz is a 54 y.o. male.  Patient is a 54 year old male with history of coronary artery disease, COPD, chronic renal insufficiency, diabetes.  Patient presents today with complaints of swelling of his penis and testicles.  This has occurred in the past 2 days. He has a history of CHF and currently takes Lasix.  Patient presents to the ER frequently with complaints of swelling.  He has been very particular in the past about how his care is administered.  He has refused recommended treatment protocols and signed out against AMA on multiple occasions.  During his last visit, he was not open to IV Lasix, however today seems okay with it.  He denies to me he is having any chest pain or worsening of his breathing.  He denies any fevers or chills.  He reports that he is able to urinate and has been urinating normally despite the swelling of his genitalia.  The history is provided by the patient.       Past Medical History:  Diagnosis Date  . Anxiety   . Asthma   . CKD stage 3 due to type 2 diabetes mellitus ([place])   . COPD (chronic obstructive pulmonary disease) ([place])   . Coronary artery disease    s/p BMS to Ramus 10/2010;  Cath 01/22/12 prox 30-40% LAD, LCx ramus w/ hazy 70-80% in-stent restenosis, EF 60% treated medically  . Coronary artery disease    s/p BMS to Ramus 10/2010;  Cath 01/22/12 prox 30-40% LAD, LCx ramus w/ hazy 70-80% in-stent restenosis, EF 60% treated medically   . Depression   . Diabetes mellitus    Type 2  . Gastroparesis 05/2013  . GERD (gastroesophageal reflux disease)   . HTN (hypertension)   . Hyperlipidemia   . Hyperlipidemia   . Major depression, chronic 08/13/2012  . Melanoma ([place]) 2007   surgery at [facility], Followed by [place]  . Myocardial infarction ([place]) 2012  . Obesity    . Tobacco abuse   . Tubular adenoma   . Urinary retention     Patient Active Problem List   Diagnosis Date Noted  . Acute diastolic CHF (congestive heart failure) ([place]) 07/23/2020  . Abnormal weight gain 07/23/2020  . Noncompliance 07/23/2020  . Pelvic lymphadenopathy 07/20/2020  . Acute on chronic diastolic CHF (congestive heart failure) ([place]) 06/30/2020  . CHF (congestive heart failure) ([place]) 06/29/2020  . Pre-ulcerative calluses 10/29/2019  . Musculoskeletal pain 10/13/2019  . Housing problems 05/23/2019  . Essential hypertension, benign 03/20/2019  . 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 Near syncope 03/17/2019  . AKI (acute kidney injury) ([place]) 03/17/2019  . 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 CKD stage 3 due to type 2 diabetes mellitus ([place]) 11/28/2018  . Microalbuminuria due to type 2 diabetes mellitus ([place]) 05/22/2018  . Hyperglycemia 04/24/2018  . Abdominal pain, epigastric 11/29/2017  . Diarrhea 11/29/2017  . History of colonic polyps 11/29/2017  . Diffuse abdominal pain 11/29/2017  . Diabetic retinopathy ([place]) 09/21/2015  . Carotid bruit 09/21/2015  . Viral gastroenteritis 06/03/2015  . Hypertriglyceridemia 05/18/2015  . Vitamin D deficiency 12/29/2013  . Nausea alone 04/24/2013  . Frequency of urination 04/14/2013  . Fever, unspecified 04/14/2013  . Hepatic steatosis 12/28/2012  . Peripheral neuropathy 11/25/2012  . Melanoma of skin, site unspecified 11/25/2012  .  Mixed hyperlipidemia   . Depression   . Hypertension associated with diabetes ([place])   . GERD (gastroesophageal reflux disease) 04/03/2012  . Current smoker 03/14/2012  . Type 2 diabetes mellitus with stage 3a chronic kidney disease, with long-term current use of insulin ([place])   . 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 BIOPSY  01/07/2018   Procedure: BIOPSY;  Surgeon: Daneil Dolin, MD;  Location: [facility] END[facility];  Service:  Endoscopy;;  duodenal and gastric biopsy  . BIOPSY  04/25/2018   Procedure: BIOPSY;  Surgeon: Daneil Dolin, MD;  Location: [facility] END[facility] SU[facility];  Service: Endoscopy;;  ascending and descending and sigmoid biopsies  . CARDIAC CATHETERIZATION     with stent  . COLONOSCOPY N/A 01/27/2013   ZDG:LOVFIE and colonic polyps. Tubular adenomas, poor bowel prep, one-year follow-up surveillance colonoscopy recommended  . COLONOSCOPY WITH PROPOFOL N/A 04/25/2018   Procedure: COLONOSCOPY WITH PROPOFOL;  Surgeon: Daneil Dolin, MD;  Location: [facility] END[facility] SU[facility];  Service: Endoscopy;  Laterality: N/A;  11:00am - pt to be prepped INPT and labs to be done as well  . CORONARY ANGIOPLASTY WITH STENT PLACEMENT    . ESOPHAGOGASTRODUODENOSCOPY  04/24/2012   [place]-mild erosive reflux esophagitis,dilated w/31F Venia Minks, small HH, minimal chronic gastric/bulbar erosions(No H pylori)  . ESOPHAGOGASTRODUODENOSCOPY (EGD) WITH PROPOFOL N/A 01/07/2018   Dr. Gala Romney: Erythematous mucosa in the stomach, retained gastric contents, incomplete exam.  Biopsied showed mild chronic gastritis but no H. pylori.  Duodenal biopsy was negative for celiac disease.  [name] FLEXIBLE SIGMOIDOSCOPY N/A 01/07/2018   Dr. Gala Romney: Incomplete colonoscopy due to inadequate bowel prep  . LEFT HEART CATHETERIZATION WITH CORONARY ANGIOGRAM N/A 01/22/2012   Procedure: LEFT HEART CATHETERIZATION WITH CORONARY ANGIOGRAM;  Surgeon: Jolaine Artist, MD;  Location[facility] CATH LAB;  Service: Cardiovascular;  Laterality: N/A;  . melanoma surgery  2007   [place], removed lymph nodes under arm as well,Left abd       Family History  Problem Relation Age of Onset  . Stroke Mother   . Alcohol abuse Father   . Heart disease Other   . Other Other        not real familiar with family history  . Lung cancer Other   . Colon cancer Neg Hx   . Colon polyps Neg Hx     Social History   Tobacco Use  . Smoking status: Current Every Day Smoker    Packs/day: 1.00    Years: 31.00     [place] years: 31.00    Types: Cigarettes    Start date: 10/09/1981  . Smokeless tobacco: Never Used  . Tobacco comment: 1/2 [place] daily 11/01/17  Vaping Use  . Vaping Use: Never used  Substance Use Topics  . 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  . Drug use: No    Home Medications Prior to Admission medications   Medication Sig Start Date End Date Taking? Authorizing Provider  acetaminophen (TYLENOL) 325 MG tablet Take 2 tablets (650 mg total) by mouth every 6 (six) hours as needed for mild pain, moderate pain or fever (or Fever >/= 101). 03/21/19   Roxan Hockey, MD  albuterol (PROVENTIL) (2.5 MG/3ML) 0.083% nebulizer solution Take 3 mLs (2.5 mg total) by nebulization every 6 (six) hours as needed for wheezing or shortness  of breath. Dx---44.1 02/16/20   [name], DO  albuterol (VENTOLIN HFA) 108 (90 Base) MCG/ACT inhaler 2 puffs every 6 hours prn 07/01/20   Ronnie Doss M, DO  apixaban (ELIQUIS) 5 MG TABS tablet Take 1 tablet (5 mg total) by mouth 2 (two) times daily. 07/16/19   Strader, Fransisco Hertz, PA-C  aspirin EC 81 MG tablet Take 1 tablet (81 mg total) by mouth daily with breakfast. 03/21/19   Roxan Hockey, MD  atorvastatin (LIPITOR) 80 MG tablet Take 1 tablet (80 mg total) by mouth daily. 07/01/20   [name], DO  Blood Glucose Monitoring Suppl (ACCU-CHEK GUIDE) w/Device KIT 1 Piece by Does not apply route as directed. 06/30/20   Claretta Fraise, MD  busPIRone (BUSPAR) 7.5 MG tablet Take 1 tablet by mouth twice daily 09/06/20   Gottschalk, Ashly M, DO  ENTRESTO 24-26 MG Take 1 tablet by mouth 2 (two) times daily. 08/01/20   [provider]  famotidine (PEPCID) 20 MG tablet Take 20 mg by mouth 2 (two) times daily. 12/22/19   [provider]  furosemide (LASIX) 80 MG tablet Take 1 tablet (80 mg total) by mouth 2 (two) times  daily. 16/1/09   Delora Fuel, MD  gabapentin (NEURONTIN) 600 MG tablet Take 1 tablet (600 mg total) by mouth 3 (three) times daily. 07/01/20   [name], DO  glucose blood (ACCU-CHEK GUIDE) test strip Use as instructed 07/01/20   Ronnie Doss M, DO  ibuprofen (ADVIL) 200 MG tablet Take by mouth.    [provider]  insulin aspart (NOVOLOG) 100 UNIT/ML FlexPen Inject into the skin. 09/21/17   [provider]  insulin glargine (LANTUS SOLOSTAR) 100 UNIT/ML Solostar Pen Inject 60 Units into the skin every evening. 07/01/20   Ronnie Doss M, DO  lisinopril (ZESTRIL) 10 MG tablet Take 1 tablet (10 mg total) by mouth at bedtime. 07/01/20   [name], DO  lisinopril (ZESTRIL) 5 MG tablet Take 5 mg by mouth at bedtime. 07/08/20   [provider]  metFORMIN (GLUCOPHAGE) 500 MG tablet Take 1 tablet (500 mg total) by mouth 2 (two) times daily with a meal. 11/12/19   Nida, Marella Chimes, MD  metoprolol succinate (TOPROL-XL) 25 MG 24 hr tablet Take 12.5 mg by mouth daily. 07/30/20   [provider]  mometasone-formoterol (DULERA) 200-5 MCG/ACT AERO Inhale 2 puffs into the lungs in the morning and at bedtime. 07/20/20   [name], DO  nitroGLYCERIN (NITROSTAT) 0.4 MG SL tablet Place 1 tablet (0.4 mg total) under the tongue every 5 (five) minutes as needed. For chest pain. 08/13/12   Reece Packer, NP  tiZANidine (ZANAFLEX) 4 MG tablet Take 4-8 mg by mouth every 6 (six) hours as needed for muscle spasms.    [provider]  traZODone (DESYREL) 150 MG tablet TAKE 1 TABLET BY MOUTH ONCE DAILY AT BEDTIME 08/31/20   Ronnie Doss M, DO  metoprolol tartrate (LOPRESSOR) 25 MG tablet Take 25 mg by mouth 2 (two) times daily.    12/27/11  [provider]    Allergies    Patient has no known allergies.  Review of Systems   Review of Systems  All other systems reviewed and are negative.   Physical Exam Updated Vital  Signs BP 116/82 (BP Location: Left Arm)   Pulse 98   Temp 97.8 F (36.6 C) (Oral)   Resp 18   Ht 6' 2" (1.88 m)   Wt 97.1 kg  SpO2 99%   BMI 27.48 kg/m   Physical Exam Vitals and nursing note reviewed.  Constitutional:      General: He is not in acute distress.    Appearance: He is well-developed and well-nourished. He is not diaphoretic.  HENT:     Head: Normocephalic and atraumatic.     Mouth/Throat:     Mouth: Oropharynx is clear and moist.  Cardiovascular:     Rate and Rhythm: Normal rate and regular rhythm.     Heart sounds: No murmur heard. No friction rub.  Pulmonary:     Effort: Pulmonary effort is normal. No respiratory distress.     Breath sounds: Normal breath sounds. No wheezing or rales.  Abdominal:     General: Bowel sounds are normal. There is no distension.     Palpations: Abdomen is soft.     Tenderness: There is no abdominal tenderness.  Genitourinary:    Comments: Patient has pitting edema to the soft tissues of the scrotum, penis, and foreskin. Musculoskeletal:        General: Normal range of motion.     Cervical back: Normal range of motion and neck supple.     Right lower leg: Edema present.     Left lower leg: Edema present.  Skin:    General: Skin is warm and dry.  Neurological:     Mental Status: He is alert and oriented to person, place, and time.     Coordination: Coordination normal.     ED Results / Procedures / Treatments   Labs (all labs ordered are listed, but only abnormal results are displayed) Labs Reviewed  COMPREHENSIVE METABOLIC PANEL  CBC WITH DIFFERENTIAL/PLATELET  BRAIN NATRIURETIC PEPTIDE    EKG None  Radiology No results found.  Procedures Procedures (including critical care time)  Medications Ordered in ED Medications  furosemide (LASIX) injection 80 mg (has no administration in time range)    ED Course  I have reviewed the triage vital signs and the nursing notes.  Pertinent labs & imaging results  that were available during my care of the patient were reviewed by me and considered in my medical decision making (see chart for details).    MDM Rules/Calculators/A&P  Patient is a 54 year old male with extensive past medical history as described in the HPI.  He presents today for evaluation of swelling of his penis and scrotum.  He has history of congestive heart failure and it appears to be related to this.  He has edema of the soft tissues of the penis, foreskin, and scrotum.  Laboratory studies have been obtained showing an elevated glucose, but no evidence for DKA.  He has baseline renal function and unremarkable blood count.  Patient to be given IV insulin along with intravenous Lasix and will be reassessed.  Care will be signed out to Dr. Eulis Foster at shift change.  He will follow up the patient's blood sugar and determine the final disposition.  Of note is that this patient has a history of noncompliance and on many occasions has left the hospital [place].  He is very particular about what studies he will allow to be performed.  Patient tells me he under no circumstances wants an EKG obtained because there is "nothing wrong with his heart" and also tells me he does not like the electrodes applied to his skin.  Patient aware that given his history, certain conditions could be undetected without appropriate test being obtained.  He understands this.  He also  tells me under no circumstances will he be admitted to the hospital.  He just wants his swelling to go away.  Final Clinical Impression(s) / ED Diagnoses Final diagnoses:  None    Rx / DC Orders ED Discharge Orders    None       Veryl Speak, MD 09/19/20 2304

## 2020-09-19 NOTE — ED Notes (Signed)
Pt refusing to keep mask on and states he "dont need that heart monitor, ain't nothing wrong with my heart!"

## 2020-09-19 NOTE — Discharge Instructions (Addendum)
Make sure you are staying on a low-salt, and low carbohydrate diet.  Avoid drinking excessive amounts of fluids.  Follow-up with your doctor for further care and treatment as soon as possible.

## 2020-09-19 NOTE — ED Triage Notes (Signed)
Pt c/o groin swelling since this morning. Pt here for same recently.

## 2020-09-19 NOTE — ED Notes (Signed)
Pt ambulatory to waiting room. Pt verbalized understanding of discharge instructions.  Pt refused discharge vital signs.

## 2020-09-19 NOTE — ED Notes (Signed)
CRITICAL VALUE ALERT  Critical Value:  Glucose 513  Date & Time Notied:  09/19/2020 0705  Provider Notified: Dr. Stark Jock  Orders Received/Actions taken: see chart

## 2020-09-22 ENCOUNTER — Other Ambulatory Visit: Payer: Self-pay | Admitting: Student

## 2020-09-22 NOTE — Telephone Encounter (Signed)
Received refill request for Eliquis.  Labs reviewed from 09/19/20:  SCr 1.42  CrCl 81.81  Hgb 13.7  Hct 41.8  Plts 443  Wt 98.4kg  Age 54.  Based on above findings Eliquis 5mg  twice daily is the appropriate dose.  Refill approved.

## 2020-09-24 ENCOUNTER — Emergency Department (HOSPITAL_COMMUNITY)
Admission: EM | Admit: 2020-09-24 | Discharge: 2020-09-24 | Disposition: A | Discharge status: Home / Self Care | Payer: 59 | Source: Home / Self Care | Attending: Emergency Medicine | Admitting: Emergency Medicine

## 2020-09-24 ENCOUNTER — Other Ambulatory Visit: Payer: Self-pay

## 2020-09-24 ENCOUNTER — Encounter (HOSPITAL_COMMUNITY): Payer: Self-pay

## 2020-09-24 DIAGNOSIS — Z7951 Long term (current) use of inhaled steroids: Secondary | ICD-10-CM | POA: Insufficient documentation

## 2020-09-24 DIAGNOSIS — R609 Edema, unspecified: Secondary | ICD-10-CM

## 2020-09-24 DIAGNOSIS — Z7901 Long term (current) use of anticoagulants: Secondary | ICD-10-CM | POA: Insufficient documentation

## 2020-09-24 DIAGNOSIS — Z794 Long term (current) use of insulin: Secondary | ICD-10-CM | POA: Insufficient documentation

## 2020-09-24 DIAGNOSIS — Z79899 Other long term (current) drug therapy: Secondary | ICD-10-CM | POA: Insufficient documentation

## 2020-09-24 DIAGNOSIS — Z7984 Long term (current) use of oral hypoglycemic drugs: Secondary | ICD-10-CM | POA: Insufficient documentation

## 2020-09-24 DIAGNOSIS — I130 Hypertensive heart and chronic kidney disease with heart failure and stage 1 through stage 4 chronic kidney disease, or unspecified chronic kidney disease: Secondary | ICD-10-CM | POA: Insufficient documentation

## 2020-09-24 DIAGNOSIS — N183 Chronic kidney disease, stage 3 unspecified: Secondary | ICD-10-CM | POA: Insufficient documentation

## 2020-09-24 DIAGNOSIS — J45909 Unspecified asthma, uncomplicated: Secondary | ICD-10-CM | POA: Insufficient documentation

## 2020-09-24 DIAGNOSIS — I5033 Acute on chronic diastolic (congestive) heart failure: Secondary | ICD-10-CM | POA: Insufficient documentation

## 2020-09-24 DIAGNOSIS — F17210 Nicotine dependence, cigarettes, uncomplicated: Secondary | ICD-10-CM | POA: Insufficient documentation

## 2020-09-24 DIAGNOSIS — I2510 Atherosclerotic heart disease of native coronary artery without angina pectoris: Secondary | ICD-10-CM | POA: Insufficient documentation

## 2020-09-24 DIAGNOSIS — E1122 Type 2 diabetes mellitus with diabetic chronic kidney disease: Secondary | ICD-10-CM | POA: Insufficient documentation

## 2020-09-24 DIAGNOSIS — R21 Rash and other nonspecific skin eruption: Secondary | ICD-10-CM

## 2020-09-24 DIAGNOSIS — J449 Chronic obstructive pulmonary disease, unspecified: Secondary | ICD-10-CM | POA: Insufficient documentation

## 2020-09-24 NOTE — Discharge Instructions (Addendum)
Your swelling will tend to improve when your protein gets better.  Try eating foods which contain lots of protein such as baked chicken.  See the attached information, to help learn about high-protein foods.  For the rash on your legs, use hydrocortisone cream, 0.5%, apply 3 times a day, which is available at any pharmacy.

## 2020-09-24 NOTE — ED Triage Notes (Signed)
Pt reports leg swelling and redness to bilateral LE. Pt reports noticing his legs yesterday, but says this is a chronic problem for him. Mild swelling noted to LE

## 2020-09-24 NOTE — ED Provider Notes (Signed)
[facility] EMERGENCY DEPARTMENT Provider Note   CSN: [id number] Arrival date & time: 09/24/20  2002     History Chief Complaint  Patient presents with  . Leg Swelling    Redness and peeling    Dustin Cruz is a 54 y.o. male.  HPI He is here for evaluation of persistent swelling in his legs, and genital region.  He is taking Lasix, 80 mg twice a day, as instructed on a recent ED evaluation.  He has not recently followed up with his cardiologist.  Is also concerned about a rash on his legs.  It has been present for years.  He denies chest pain, fever, chills, cough, shortness of breath, weakness or dizziness.  He drove his vehicle here for evaluation.  There are no other known modifying factors.    Past Medical History:  Diagnosis Date  . Anxiety   . Asthma   . CKD stage 3 due to type 2 diabetes mellitus ([place])   . COPD (chronic obstructive pulmonary disease) ([place])   . Coronary artery disease    s/p BMS to [place] 10/2010;  Cath 01/22/12 prox 30-40% LAD, LCx ramus w/ hazy 70-80% in-stent restenosis, EF 60% treated medically  . Coronary artery disease    s/p BMS to [facility] 10/2010;  Cath 01/22/12 prox 30-40% LAD, LCx ramus w/ hazy 70-80% in-stent restenosis, EF 60% treated medically   . Depression   . Diabetes mellitus    Type 2  . Gastroparesis 05/2013  . GERD (gastroesophageal reflux disease)   . HTN (hypertension)   . Hyperlipidemia   . Hyperlipidemia   . Major depression, chronic 08/13/2012  . Melanoma ([place]) 2007   surgery at [facility], Followed by [place]  . Myocardial infarction ([place]) 2012  . Obesity   . Tobacco abuse   . Tubular adenoma   . Urinary retention     Patient Active Problem List   Diagnosis Date Noted  . Acute diastolic CHF (congestive heart failure) ([place]) 07/23/2020  . Abnormal weight gain 07/23/2020  . Noncompliance 07/23/2020  . Pelvic lymphadenopathy 07/20/2020  . Acute on chronic diastolic CHF (congestive heart failure) ([place]) 06/30/2020  . CHF  (congestive heart failure) ([place]) 06/29/2020  . Pre-ulcerative calluses 10/29/2019  . Musculoskeletal pain 10/13/2019  . Housing problems 05/23/2019  . Essential hypertension, benign 03/20/2019  . 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 Near syncope 03/17/2019  . AKI (acute kidney injury) ([place]) 03/17/2019  . 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 CKD stage 3 due to type 2 diabetes mellitus ([place]) 11/28/2018  . Microalbuminuria due to type 2 diabetes mellitus ([place]) 05/22/2018  . Hyperglycemia 04/24/2018  . Abdominal pain, epigastric 11/29/2017  . Diarrhea 11/29/2017  . History of colonic polyps 11/29/2017  . Diffuse abdominal pain 11/29/2017  . Diabetic retinopathy ([place]) 09/21/2015  . Carotid bruit 09/21/2015  . Viral gastroenteritis 06/03/2015  . Hypertriglyceridemia 05/18/2015  . Vitamin D deficiency 12/29/2013  . Nausea alone 04/24/2013  . Frequency of urination 04/14/2013  . Fever, unspecified 04/14/2013  . Hepatic steatosis 12/28/2012  . Peripheral neuropathy 11/25/2012  . Melanoma of skin, site unspecified 11/25/2012  . Mixed hyperlipidemia   . Depression   . Hypertension associated with diabetes ([place])   . GERD (gastroesophageal reflux disease) 04/03/2012  . Current smoker 03/14/2012  . Type 2 diabetes mellitus with stage 3a chronic kidney disease, with long-term current use of insulin ([place])   . 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  BIOPSY  01/07/2018   Procedure: BIOPSY;  Surgeon: Daneil Dolin, MD;  Location: [facility] ENDO SU[facility];  Service: Endoscopy;;  duodenal and gastric biopsy  . BIOPSY  04/25/2018   Procedure: BIOPSY;  Surgeon: Daneil Dolin, MD;  Location: [facility] ENDO SU[facility];  Service: Endoscopy;;  ascending and descending and sigmoid biopsies  . CARDIAC CATHETERIZATION     with stent  . COLONOSCOPY N/A 01/27/2013   ERD:EYCXKG and colonic polyps. Tubular adenomas, poor bowel prep, one-year follow-up surveillance  colonoscopy recommended  . COLONOSCOPY WITH PROPOFOL N/A 04/25/2018   Procedure: COLONOSCOPY WITH PROPOFOL;  Surgeon: Daneil Dolin, MD;  Location: [facility] ENDO SU[facility];  Service: Endoscopy;  Laterality: N/A;  11:00am - pt to be prepped INPT and labs to be done as well  . CORONARY ANGIOPLASTY WITH STENT PLACEMENT    . ESOPHAGOGASTRODUODENOSCOPY  04/24/2012   [place]-mild erosive reflux esophagitis,dilated w/43F Venia Minks, small HH, minimal chronic gastric/bulbar erosions(No H pylori)  . ESOPHAGOGASTRODUODENOSCOPY (EGD) WITH PROPOFOL N/A 01/07/2018   Dr. Gala Romney: Erythematous mucosa in the stomach, retained gastric contents, incomplete exam.  Biopsied showed mild chronic gastritis but no H. pylori.  Duodenal biopsy was negative for celiac disease.  [name] FLEXIBLE SIGMOIDOSCOPY N/A 01/07/2018   Dr. Gala Romney: Incomplete colonoscopy due to inadequate bowel prep  . LEFT HEART CATHETERIZATION WITH CORONARY ANGIOGRAM N/A 01/22/2012   Procedure: LEFT HEART CATHETERIZATION WITH CORONARY ANGIOGRAM;  Surgeon: Jolaine Artist, MD;  Location: [facility] CATH LAB;  Service: Cardiovascular;  Laterality: N/A;  . melanoma surgery  2007   [place], removed lymph nodes under arm as well,Left abd       Family History  Problem Relation Age of Onset  . Stroke Mother   . Alcohol abuse Father   . Heart disease Other   . Other Other        not real familiar with family history  . Lung cancer Other   . Colon cancer Neg Hx   . Colon polyps Neg Hx     Social History   Tobacco Use  . Smoking status: Current Every Day Smoker    Packs/day: 1.00    Years: 31.00    [place] years: 31.00    Types: Cigarettes    Start date: 10/09/1981  . Smokeless tobacco: Never Used  . Tobacco comment: 1/2 [place] daily 11/01/17  Vaping Use  . Vaping Use: Never used  Substance Use Topics  . 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  . Drug  use: No    Home Medications Prior to Admission medications   Medication Sig Start Date End Date Taking? Authorizing Provider  acetaminophen (TYLENOL) 325 MG tablet Take 2 tablets (650 mg total) by mouth every 6 (six) hours as needed for mild pain, moderate pain or fever (or Fever >/= 101). 03/21/19   Roxan Hockey, MD  albuterol (PROVENTIL) (2.5 MG/3ML) 0.083% nebulizer solution Take 3 mLs (2.5 mg total) by nebulization every 6 (six) hours as needed for wheezing or shortness of breath. Dx---44.1 02/16/20   [name], DO  albuterol (VENTOLIN HFA) 108 (90 Base) MCG/ACT inhaler 2 puffs every 6 hours prn 07/01/20   Ronnie Doss M, DO  aspirin EC 81 MG tablet Take 1 tablet (81 mg total) by mouth daily with breakfast. 03/21/19   Roxan Hockey, MD  atorvastatin (LIPITOR) 80 MG tablet Take 1 tablet (80 mg  total) by mouth daily. 07/01/20   [name], DO  Blood Glucose Monitoring Suppl (ACCU-CHEK GUIDE) w/Device KIT 1 Piece by Does not apply route as directed. 06/30/20   Claretta Fraise, MD  busPIRone (BUSPAR) 7.5 MG tablet Take 1 tablet by mouth twice daily 09/06/20   Ronnie Doss M, DO  ELIQUIS 5 MG TABS tablet Take 1 tablet by mouth twice daily 09/22/20   Strader, [place] M, PA-C  ENTRESTO 24-26 MG Take 1 tablet by mouth 2 (two) times daily. 08/01/20   [provider]  famotidine (PEPCID) 20 MG tablet Take 20 mg by mouth 2 (two) times daily. 12/22/19   [provider]  furosemide (LASIX) 80 MG tablet Take 1 tablet (80 mg total) by mouth 2 (two) times daily. 99/2/42   Delora Fuel, MD  gabapentin (NEURONTIN) 600 MG tablet Take 1 tablet (600 mg total) by mouth 3 (three) times daily. 07/01/20   [name], DO  glucose blood (ACCU-CHEK GUIDE) test strip Use as instructed 07/01/20   Ronnie Doss M, DO  ibuprofen (ADVIL) 200 MG tablet Take by mouth.    [provider]  insulin aspart (NOVOLOG) 100 UNIT/ML FlexPen Inject into the skin.  09/21/17   [provider]  insulin glargine (LANTUS SOLOSTAR) 100 UNIT/ML Solostar Pen Inject 60 Units into the skin every evening. 07/01/20   Ronnie Doss M, DO  lisinopril (ZESTRIL) 10 MG tablet Take 1 tablet (10 mg total) by mouth at bedtime. 07/01/20   [name], DO  lisinopril (ZESTRIL) 5 MG tablet Take 5 mg by mouth at bedtime. 07/08/20   [provider]  metFORMIN (GLUCOPHAGE) 500 MG tablet Take 1 tablet (500 mg total) by mouth 2 (two) times daily with a meal. 11/12/19   Nida, Marella Chimes, MD  metoprolol succinate (TOPROL-XL) 25 MG 24 hr tablet Take 12.5 mg by mouth daily. 07/30/20   [provider]  mometasone-formoterol (DULERA) 200-5 MCG/ACT AERO Inhale 2 puffs into the lungs in the morning and at bedtime. 07/20/20   [name], DO  nitroGLYCERIN (NITROSTAT) 0.4 MG SL tablet Place 1 tablet (0.4 mg total) under the tongue every 5 (five) minutes as needed. For chest pain. 08/13/12   Reece Packer, NP  tiZANidine (ZANAFLEX) 4 MG tablet Take 4-8 mg by mouth every 6 (six) hours as needed for muscle spasms.    [provider]  traZODone (DESYREL) 150 MG tablet TAKE 1 TABLET BY MOUTH ONCE DAILY AT BEDTIME 08/31/20   Ronnie Doss M, DO  metoprolol tartrate (LOPRESSOR) 25 MG tablet Take 25 mg by mouth 2 (two) times daily.    12/27/11  [provider]    Allergies    Patient has no known allergies.  Review of Systems   Review of Systems  All other systems reviewed and are negative.   Physical Exam Updated Vital Signs BP (!) 135/96 (BP Location: Right Arm)   Pulse 85   Temp 97.6 F (36.4 C) (Oral)   Resp 18   Ht '6\' 2"'  (1.88 m)   Wt 97.1 kg   SpO2 100%   BMI 27.48 kg/m   Physical Exam Vitals and nursing note reviewed.  Constitutional:      General: He is not in acute distress.    Appearance: He is well-developed and well-nourished. He is not ill-appearing, toxic-appearing or diaphoretic.  HENT:     Head:  Normocephalic and atraumatic.     Right Ear: External ear normal.  Left Ear: External ear normal.  Eyes:     Extraocular Movements: EOM normal.     Conjunctiva/sclera: Conjunctivae normal.     Pupils: Pupils are equal, round, and reactive to light.  Neck:     Trachea: Phonation normal.  Cardiovascular:     Rate and Rhythm: Normal rate and regular rhythm.     Heart sounds: Normal heart sounds. No murmur heard.   Pulmonary:     Effort: Pulmonary effort is normal. No respiratory distress.     Breath sounds: Normal breath sounds. No stridor. No rhonchi.  Chest:     Chest wall: No bony tenderness.  Abdominal:     General: There is no distension.     Palpations: Abdomen is soft.  Musculoskeletal:        General: Normal range of motion.     Cervical back: Normal range of motion and neck supple.     Comments: Bilateral peripheral edema, genital region to toes.  Edema is moderate.  Skin:    General: Skin is warm, dry and intact.     Comments: Plaque-like rash of anterior shins bilaterally, with mild erythema, but no drainage or bleeding.  Neurological:     Mental Status: He is alert and oriented to person, place, and time.     Cranial Nerves: No cranial nerve deficit.     Sensory: No sensory deficit.     Motor: No abnormal muscle tone.     Coordination: Coordination normal.  Psychiatric:        Mood and Affect: Mood and affect and mood normal.        Behavior: Behavior normal.        Thought Content: Thought content normal.        Judgment: Judgment normal.     ED Results / Procedures / Treatments   Labs (all labs ordered are listed, but only abnormal results are displayed) Labs Reviewed - No data to display  EKG None  Radiology No results found.  Procedures Procedures (including critical care time)  Medications Ordered in ED Medications - No data to display  ED Course  I have reviewed the triage vital signs and the nursing notes.  Pertinent labs & imaging  results that were available during my care of the patient were reviewed by me and considered in my medical decision making (see chart for details).    MDM Rules/Calculators/A&P                           Patient Vitals for the past 24 hrs:  BP Temp Temp src Pulse Resp SpO2 Height Weight  09/24/20 2011 -- -- -- -- -- -- '6\' 2"'  (1.88 m) 97.1 kg  09/24/20 2010 (!) 135/96 97.6 F (36.4 C) Oral 85 18 100 % -- --    Time of discharge- reevaluation with update and discussion. After initial assessment and treatment, an updated evaluation reveals he is comfortable and has no further complaints.  Findings discussed and questions answered. Daleen Bo   Medical Decision Making:  This patient is presenting for evaluation of rash and swelling, which does require a range of treatment options, and is a complaint that involves a moderate risk of morbidity and mortality. The differential diagnoses include acute congestive heart failure, chronic peripheral edema, acute illness. I decided to review old records, and in summary middle-aged male, with frequent ED evaluations, presenting for leg swelling and rash.  I I did not require additional  historical information from anyone.   Critical Interventions-clinical evaluation  After These Interventions, the Patient was reevaluated and was found stable for discharge  CRITICAL CARE-no Performed by: Daleen Bo  Nursing Notes Reviewed/ Care Coordinated Applicable Imaging Reviewed Interpretation of Laboratory Data incorporated into ED treatment  The patient appears reasonably screened and/or stabilized for discharge and I doubt any other medical condition or other [facility] requiring further screening, evaluation, or treatment in the ED at this time prior to discharge.  Plan: Home Medications-use hydrocortisone cream on rash of lower legs.  Continue usual; Home Treatments-elevate legs to improve swelling, consider using compression stockings; return here if the  recommended treatment, does not improve the symptoms; Recommended follow up-PCP follow-up 1 week and as needed     Final Clinical Impression(s) / ED Diagnoses Final diagnoses:  Peripheral edema  Rash    Rx / DC Orders ED Discharge Orders    None       Daleen Bo, MD 09/25/20 1531

## 2020-09-26 ENCOUNTER — Inpatient Hospital Stay (HOSPITAL_COMMUNITY)
Admission: EM | Admit: 2020-09-26 | Discharge: 2020-09-27 | DRG: 291 | Discharge status: Against Medical Advice | Payer: 59 | Attending: Internal Medicine | Admitting: Internal Medicine

## 2020-09-26 ENCOUNTER — Encounter (HOSPITAL_COMMUNITY): Payer: Self-pay | Admitting: Emergency Medicine

## 2020-09-26 ENCOUNTER — Other Ambulatory Visit: Payer: Self-pay

## 2020-09-26 ENCOUNTER — Emergency Department (HOSPITAL_COMMUNITY): Payer: 59

## 2020-09-26 DIAGNOSIS — K219 Gastro-esophageal reflux disease without esophagitis: Secondary | ICD-10-CM | POA: Diagnosis present

## 2020-09-26 DIAGNOSIS — E1165 Type 2 diabetes mellitus with hyperglycemia: Secondary | ICD-10-CM | POA: Diagnosis not present

## 2020-09-26 DIAGNOSIS — I2510 Atherosclerotic heart disease of native coronary artery without angina pectoris: Secondary | ICD-10-CM | POA: Diagnosis present

## 2020-09-26 DIAGNOSIS — Z811 Family history of alcohol abuse and dependence: Secondary | ICD-10-CM

## 2020-09-26 DIAGNOSIS — I130 Hypertensive heart and chronic kidney disease with heart failure and stage 1 through stage 4 chronic kidney disease, or unspecified chronic kidney disease: Principal | ICD-10-CM | POA: Diagnosis present

## 2020-09-26 DIAGNOSIS — Z79899 Other long term (current) drug therapy: Secondary | ICD-10-CM

## 2020-09-26 DIAGNOSIS — I5043 Acute on chronic combined systolic (congestive) and diastolic (congestive) heart failure: Secondary | ICD-10-CM | POA: Diagnosis present

## 2020-09-26 DIAGNOSIS — D75839 Thrombocytosis, unspecified: Secondary | ICD-10-CM | POA: Diagnosis present

## 2020-09-26 DIAGNOSIS — I252 Old myocardial infarction: Secondary | ICD-10-CM

## 2020-09-26 DIAGNOSIS — N5089 Other specified disorders of the male genital organs: Secondary | ICD-10-CM | POA: Diagnosis present

## 2020-09-26 DIAGNOSIS — E785 Hyperlipidemia, unspecified: Secondary | ICD-10-CM | POA: Diagnosis present

## 2020-09-26 DIAGNOSIS — R601 Generalized edema: Secondary | ICD-10-CM

## 2020-09-26 DIAGNOSIS — R609 Edema, unspecified: Secondary | ICD-10-CM

## 2020-09-26 DIAGNOSIS — Z86010 Personal history of colonic polyps: Secondary | ICD-10-CM

## 2020-09-26 DIAGNOSIS — Z91199 Patient's noncompliance with other medical treatment and regimen due to unspecified reason: Secondary | ICD-10-CM

## 2020-09-26 DIAGNOSIS — Z801 Family history of malignant neoplasm of trachea, bronchus and lung: Secondary | ICD-10-CM

## 2020-09-26 DIAGNOSIS — Z7984 Long term (current) use of oral hypoglycemic drugs: Secondary | ICD-10-CM

## 2020-09-26 DIAGNOSIS — E1122 Type 2 diabetes mellitus with diabetic chronic kidney disease: Secondary | ICD-10-CM | POA: Diagnosis present

## 2020-09-26 DIAGNOSIS — Z9114 Patient's other noncompliance with medication regimen: Secondary | ICD-10-CM

## 2020-09-26 DIAGNOSIS — E782 Mixed hyperlipidemia: Secondary | ICD-10-CM | POA: Diagnosis present

## 2020-09-26 DIAGNOSIS — Z20822 Contact with and (suspected) exposure to covid-19: Secondary | ICD-10-CM | POA: Diagnosis present

## 2020-09-26 DIAGNOSIS — J449 Chronic obstructive pulmonary disease, unspecified: Secondary | ICD-10-CM | POA: Diagnosis present

## 2020-09-26 DIAGNOSIS — Z823 Family history of stroke: Secondary | ICD-10-CM

## 2020-09-26 DIAGNOSIS — I509 Heart failure, unspecified: Secondary | ICD-10-CM

## 2020-09-26 DIAGNOSIS — Z5329 Procedure and treatment not carried out because of patient's decision for other reasons: Secondary | ICD-10-CM | POA: Diagnosis not present

## 2020-09-26 DIAGNOSIS — Z7982 Long term (current) use of aspirin: Secondary | ICD-10-CM

## 2020-09-26 DIAGNOSIS — Z85820 Personal history of malignant melanoma of skin: Secondary | ICD-10-CM

## 2020-09-26 DIAGNOSIS — Z955 Presence of coronary angioplasty implant and graft: Secondary | ICD-10-CM

## 2020-09-26 DIAGNOSIS — F17210 Nicotine dependence, cigarettes, uncomplicated: Secondary | ICD-10-CM | POA: Diagnosis present

## 2020-09-26 DIAGNOSIS — I5023 Acute on chronic systolic (congestive) heart failure: Secondary | ICD-10-CM | POA: Diagnosis present

## 2020-09-26 DIAGNOSIS — I5030 Unspecified diastolic (congestive) heart failure: Secondary | ICD-10-CM | POA: Diagnosis not present

## 2020-09-26 DIAGNOSIS — R21 Rash and other nonspecific skin eruption: Secondary | ICD-10-CM | POA: Diagnosis present

## 2020-09-26 DIAGNOSIS — Z794 Long term (current) use of insulin: Secondary | ICD-10-CM

## 2020-09-26 DIAGNOSIS — N1831 Chronic kidney disease, stage 3a: Secondary | ICD-10-CM | POA: Diagnosis present

## 2020-09-26 DIAGNOSIS — Z9119 Patient's noncompliance with other medical treatment and regimen: Secondary | ICD-10-CM | POA: Diagnosis not present

## 2020-09-26 DIAGNOSIS — E1142 Type 2 diabetes mellitus with diabetic polyneuropathy: Secondary | ICD-10-CM | POA: Diagnosis present

## 2020-09-26 LAB — CBC WITH DIFFERENTIAL/PLATELET
Abs Immature Granulocytes: 0.03 10*3/uL (ref 0.00–0.07)
Basophils Absolute: 0.1 10*3/uL (ref 0.0–0.1)
Basophils Relative: 1 %
Eosinophils Absolute: 0.1 10*3/uL (ref 0.0–0.5)
Eosinophils Relative: 1 %
HCT: 45 % (ref 39.0–52.0)
Hemoglobin: 14.5 g/dL (ref 13.0–17.0)
Immature Granulocytes: 0 %
Lymphocytes Relative: 16 %
Lymphs Abs: 1.3 10*3/uL (ref 0.7–4.0)
MCH: 30.9 pg (ref 26.0–34.0)
MCHC: 32.2 g/dL (ref 30.0–36.0)
MCV: 95.9 fL (ref 80.0–100.0)
Monocytes Absolute: 0.8 10*3/uL (ref 0.1–1.0)
Monocytes Relative: 10 %
Neutro Abs: 6.2 10*3/uL (ref 1.7–7.7)
Neutrophils Relative %: 72 %
Platelets: 437 10*3/uL — ABNORMAL HIGH (ref 150–400)
RBC: 4.69 MIL/uL (ref 4.22–5.81)
RDW: 14.6 % (ref 11.5–15.5)
WBC: 8.5 10*3/uL (ref 4.0–10.5)
nRBC: 0 % (ref 0.0–0.2)

## 2020-09-26 LAB — COMPREHENSIVE METABOLIC PANEL
ALT: 46 U/L — ABNORMAL HIGH (ref 0–44)
AST: 35 U/L (ref 15–41)
Albumin: 3.1 g/dL — ABNORMAL LOW (ref 3.5–5.0)
Alkaline Phosphatase: 185 U/L — ABNORMAL HIGH (ref 38–126)
Anion gap: 9 (ref 5–15)
BUN: 16 mg/dL (ref 6–20)
CO2: 32 mmol/L (ref 22–32)
Calcium: 9.1 mg/dL (ref 8.9–10.3)
Chloride: 85 mmol/L — ABNORMAL LOW (ref 98–111)
Creatinine, Ser: 1.31 mg/dL — ABNORMAL HIGH (ref 0.61–1.24)
GFR, Estimated: >60 mL/min (ref >60)
Glucose, Bld: 518 mg/dL (ref 70–99)
Potassium: 4.2 mmol/L (ref 3.5–5.1)
Sodium: 126 mmol/L — ABNORMAL LOW (ref 135–145)
Total Bilirubin: 0.7 mg/dL (ref 0.3–1.2)
Total Protein: 7.7 g/dL (ref 6.5–8.1)

## 2020-09-26 LAB — BRAIN NATRIURETIC PEPTIDE: B Natriuretic Peptide: 1979 pg/mL — ABNORMAL HIGH (ref 0.0–100.0)

## 2020-09-26 LAB — CBG MONITORING, ED
Glucose-Capillary: 569 mg/dL (ref 70–99)
Glucose-Capillary: 502 mg/dL (ref 70–99)
Glucose-Capillary: 427 mg/dL — ABNORMAL HIGH (ref 70–99)

## 2020-09-26 MED ORDER — INSULIN ASPART 100 UNIT/ML ~~LOC~~ SOLN
20.0000 [IU] | Freq: Once | SUBCUTANEOUS | Status: AC
  Administered 2020-09-26: 20:00:00 20 [IU] via SUBCUTANEOUS
  Filled 2020-09-26: qty 1

## 2020-09-26 NOTE — H&P (Signed)
History and Physical  Dustin Cruz CXK:[id number] DOB: [birth year] DOA: 09/26/2020  Referring physician: Milton Ferguson, MD PCP: [name], DO  Patient coming from: Home  Chief Complaint: Scrotum swelling  HPI: Dustin Cruz is a 54 y.o. male with medical history significant for CAD s/p stent placement, COPD, Type 2 Diabetes Mellitus, history of noncompliance with medication who presents to the emergency department due to complaint of swelling of his scrotum and penis which has been going on for several days.  Patient was seen for same complaint in the ED 1 week ago (12/12) as well as for elevated blood glucose levels that was treated with IV insulin.  Patient returned to the ED on 12/17 for peripheral edema and chronic rash on legs.  Patient states that he has been compliant with his home Lasix and diet.  He denies chest pain, shortness of breath or any discomfort in urinating despite the scrotal and penile swelling.  ED Course: In the emergency department, he was hemodynamically stable.  Work-up in the ED showed thrombocytosis, hyperglycemia, ALT 46, ALP 185, albumin 3.1.  Chest x-ray showed chronic central vascular congestion without evidence of edema.  20 units of insulin subcu was given.  Hospitalist was asked to admit patient for further evaluation and management.  Review of Systems: Constitutional: Negative for chills and fever.  HENT: Negative for ear pain and sore throat.   Eyes: Negative for pain and visual disturbance.  Respiratory: Negative for cough, chest tightness and shortness of breath.   Cardiovascular: Negative for chest pain and palpitations.  Gastrointestinal: Negative for abdominal pain and vomiting.  Endocrine: Negative for polyphagia and polyuria.  Genitourinary: Positive for penile and scrotal swelling.  Negative for decreased urine volume, dysuria, enuresis Musculoskeletal: Positive for bilateral leg swelling.  Negative for arthralgias and back pain.  Skin:  Negative for color change and rash.  Allergic/Immunologic: Negative for immunocompromised state.  Neurological: Negative for tremors, syncope, speech difficulty, weakness, light-headedness and headaches.  Hematological: Does not bruise/bleed easily.  All other systems reviewed and are negative   Past Medical History:  Diagnosis Date  . Anxiety   . Asthma   . CKD stage 3 due to type 2 diabetes mellitus ([place])   . COPD (chronic obstructive pulmonary disease) ([place])   . Coronary artery disease    s/p BMS to Ramus 10/2010;  Cath 01/22/12 prox 30-40% LAD, LCx ramus w/ hazy 70-80% in-stent restenosis, EF 60% treated medically  . Coronary artery disease    s/p BMS to Ramus 10/2010;  Cath 01/22/12 prox 30-40% LAD, LCx ramus w/ hazy 70-80% in-stent restenosis, EF 60% treated medically   . Depression   . Diabetes mellitus    Type 2  . Gastroparesis 05/2013  . GERD (gastroesophageal reflux disease)   . HTN (hypertension)   . Hyperlipidemia   . Hyperlipidemia   . Major depression, chronic 08/13/2012  . Melanoma ([place]) 2007   surgery at [facility], Followed by [place]  . Myocardial infarction ([place]) 2012  . Obesity   . Tobacco abuse   . Tubular adenoma   . Urinary retention    Past Surgical History:  Procedure Laterality Date  . BIOPSY  01/07/2018   Procedure: BIOPSY;  Surgeon: Daneil Dolin, MD;  Location: [facility];  Service: Endoscopy;;  duodenal and gastric biopsy  . BIOPSY  04/25/2018   Procedure: BIOPSY;  Surgeon: Daneil Dolin, MD;  Location: [facility];  Service: Endoscopy;;  ascending and descending and sigmoid biopsies  .  CARDIAC CATHETERIZATION     with stent  . COLONOSCOPY N/A 01/27/2013   VUY:EBXIDH and colonic polyps. Tubular adenomas, poor bowel prep, one-year follow-up surveillance colonoscopy recommended  . COLONOSCOPY WITH PROPOFOL N/A 04/25/2018   Procedure: COLONOSCOPY WITH PROPOFOL;  Surgeon: Daneil Dolin, MD;  Location: [facility];  Service: Endoscopy;   Laterality: N/A;  11:00am - pt to be prepped INPT and labs to be done as well  . CORONARY ANGIOPLASTY WITH STENT PLACEMENT    . ESOPHAGOGASTRODUODENOSCOPY  04/24/2012   [place]-mild erosive reflux esophagitis,dilated w/61F Venia Minks, small HH, minimal chronic gastric/bulbar erosions(No H pylori)  . ESOPHAGOGASTRODUODENOSCOPY (EGD) WITH PROPOFOL N/A 01/07/2018   Dr. Gala Romney: Erythematous mucosa in the stomach, retained gastric contents, incomplete exam.  Biopsied showed mild chronic gastritis but no H. pylori.  Duodenal biopsy was negative for celiac disease.  [name] FLEXIBLE SIGMOIDOSCOPY N/A 01/07/2018   Dr. Gala Romney: Incomplete colonoscopy due to inadequate bowel prep  . LEFT HEART CATHETERIZATION WITH CORONARY ANGIOGRAM N/A 01/22/2012   Procedure: LEFT HEART CATHETERIZATION WITH CORONARY ANGIOGRAM;  Surgeon: Jolaine Artist, MD;  [facility] C[facility] LAB;  Service: Cardiovascular;  Laterality: N/A;  . melanoma surgery  2007   [place], removed lymph nodes under arm as well,Left abd    Social History:  reports that he has been smoking cigarettes. He started smoking about 38 years ago. He has a 31.00 [place]-year smoking history. He has never used smokeless tobacco. He reports that he does not drink alcohol and does not use drugs.   No Known Allergies  Family History  Problem Relation Age of Onset  . Stroke Mother   . Alcohol abuse Father   . Heart disease Other   . Other Other        not real familiar with family history  . Lung cancer Other   . Colon cancer Neg Hx   . Colon polyps Neg Hx     Prior to Admission medications   Medication Sig Start Date End Date Taking? Authorizing Provider  albuterol (PROVENTIL) (2.5 MG/3ML) 0.083% nebulizer solution Take 3 mLs (2.5 mg total) by nebulization every 6 (six) hours as needed for wheezing or shortness of breath. Dx---44.1 02/16/20  Yes Ronnie Doss M, DO  albuterol (VENTOLIN HFA) 108 (90 Base) MCG/ACT inhaler 2 puffs every 6 hours prn 07/01/20  Yes Gottschalk,  Ashly M, DO  aspirin EC 81 MG tablet Take 1 tablet (81 mg total) by mouth daily with breakfast. 03/21/19  Yes Emokpae, Courage, MD  atorvastatin (LIPITOR) 80 MG tablet Take 1 tablet (80 mg total) by mouth daily. 07/01/20  Yes Gottschalk, Leatrice Jewels M, DO  busPIRone (BUSPAR) 7.5 MG tablet Take 1 tablet by mouth twice daily 09/06/20  Yes Ronnie Doss M, DO  ELIQUIS 5 MG TABS tablet Take 1 tablet by mouth twice daily 09/22/20  Yes Strader, [place] M, PA-C  ENTRESTO 24-26 MG Take 1 tablet by mouth 2 (two) times daily. 08/01/20  Yes [provider]  famotidine (PEPCID) 20 MG tablet Take 20 mg by mouth 2 (two) times daily. 12/22/19  Yes [provider]  furosemide (LASIX) 80 MG tablet Take 1 tablet (80 mg total) by mouth 2 (two) times daily. 68/6/16  Yes Delora Fuel, MD  gabapentin (NEURONTIN) 600 MG tablet Take 1 tablet (600 mg total) by mouth 3 (three) times daily. 07/01/20  Yes Gottschalk, Leatrice Jewels M, DO  insulin glargine (LANTUS SOLOSTAR) 100 UNIT/ML Solostar Pen Inject 60 Units into the skin every evening. 07/01/20  Yes  Ronnie Doss M, DO  lisinopril (ZESTRIL) 10 MG tablet Take 1 tablet (10 mg total) by mouth at bedtime. 07/01/20  Yes Ronnie Doss M, DO  metoprolol succinate (TOPROL-XL) 25 MG 24 hr tablet Take 12.5 mg by mouth daily. 07/30/20  Yes [provider]  mometasone-formoterol (DULERA) 200-5 MCG/ACT AERO Inhale 2 puffs into the lungs in the morning and at bedtime. 07/20/20  Yes Gottschalk, Leatrice Jewels M, DO  nitroGLYCERIN (NITROSTAT) 0.4 MG SL tablet Place 1 tablet (0.4 mg total) under the tongue every 5 (five) minutes as needed. For chest pain. 08/13/12  Yes Reece Packer, NP  oxyCODONE (OXY IR/ROXICODONE) 5 MG immediate release tablet Take 5 mg by mouth 2 (two) times daily as needed. 09/26/20  Yes [provider]  potassium chloride (KLOR-CON) 10 MEQ tablet Take 10 mEq by mouth 2 (two) times daily. 09/16/20  Yes [provider]  tiZANidine  (ZANAFLEX) 4 MG tablet Take 4-8 mg by mouth every 6 (six) hours as needed for muscle spasms.   Yes [provider]  traZODone (DESYREL) 150 MG tablet TAKE 1 TABLET BY MOUTH ONCE DAILY AT BEDTIME 08/31/20  Yes Gottschalk, Ashly M, DO  acetaminophen (TYLENOL) 325 MG tablet Take 2 tablets (650 mg total) by mouth every 6 (six) hours as needed for mild pain, moderate pain or fever (or Fever >/= 101). 03/21/19   Roxan Hockey, MD  Blood Glucose Monitoring Suppl (ACCU-CHEK GUIDE) w/Device KIT 1 Piece by Does not apply route as directed. 06/30/20   Claretta Fraise, MD  glucose blood (ACCU-CHEK GUIDE) test strip Use as instructed 07/01/20   Ronnie Doss M, DO  metFORMIN (GLUCOPHAGE) 500 MG tablet Take 1 tablet (500 mg total) by mouth 2 (two) times daily with a meal. Patient not taking: No sig reported 11/12/19   Cassandria Anger, MD  metoprolol tartrate (LOPRESSOR) 25 MG tablet Take 25 mg by mouth 2 (two) times daily.    12/27/11  [provider]    Physical Exam: BP (!) 134/91   Pulse 75   Temp 98.2 F (36.8 C) (Oral)   Resp 19   Ht '6\' 2"'  (1.88 m)   Wt 97.1 kg   SpO2 99%   BMI 27.48 kg/m   . General: 54 y.o. year-old male well developed well nourished in no acute distress.  Alert and oriented x3. [name] HEENT: NCAT, EOMI . Neck: Supple, trachea media . Cardiovascular: Irregular rate and rhythm with no rubs or gallops.  No thyromegaly or JVD noted.  2/4 pulses in all 4 extremities. [name] Respiratory: Clear to auscultation with no wheezes or rales. Good inspiratory effort. . Abdomen: Soft nontender nondistended with normal bowel sounds x4 quadrants. . Muskuloskeletal: +2 bilateral lower extremity edema from foot to the thighs.  No cyanosis or clubbing  noted bilaterally . Neuro: CN II-XII intact, strength, sensation, reflexes . Skin: No ulcerative lesions noted or rashes . Psychiatry: Judgement and insight appear normal. Mood is appropriate for condition and setting           Labs on Admission:  Basic Metabolic Panel: Recent Labs  Lab 09/26/20 2005  NA 126*  K 4.2  CL 85*  CO2 32  GLUCOSE 518*  BUN 16  CREATININE 1.31*  CALCIUM 9.1   Liver Function Tests: Recent Labs  Lab 09/26/20 2005  AST 35  ALT 46*  ALKPHOS 185*  BILITOT 0.7  PROT 7.7  ALBUMIN 3.1*   No results for input(s): LIPASE, AMYLASE in the last 168 hours. No  results for input(s): AMMONIA in the last 168 hours. CBC: Recent Labs  Lab 09/26/20 2005  WBC 8.5  NEUTROABS 6.2  HGB 14.5  HCT 45.0  MCV 95.9  PLT 437*   Cardiac Enzymes: No results for input(s): CKTOTAL, CKMB, CKMBINDEX, TROPONINI in the last 168 hours.  BNP (last 3 results) Recent Labs    09/15/20 1953 09/19/20 0610 09/26/20 2005  BNP 1,630.0* 1,435.0* 1,979.0*    ProBNP (last 3 results) No results for input(s): PROBNP in the last 8760 hours.  CBG: Recent Labs  Lab 09/26/20 1853 09/26/20 1959 09/26/20 2115 09/27/20 0001  GLUCAP 569* 502* 427* 72    Radiological Exams on Admission: DG Chest Port 1 View  Result Date: 09/26/2020 CLINICAL DATA:  Testicular swelling, COPD, weakness EXAM: PORTABLE CHEST 1 VIEW COMPARISON:  09/15/2020 FINDINGS: Single frontal view of the chest demonstrates a stable cardiac silhouette. No acute airspace disease, effusion, or pneumothorax. Mild central vascular congestion unchanged. No acute bony abnormalities. IMPRESSION: 1. Chronic central vascular congestion without evidence of edema. Electronically Signed   By: Randa Ngo M.D.   On: 09/26/2020 20:14    EKG: I independently viewed the EKG done and my findings are as followed: Sinus arrhythmia at a rate of 83 bpm and QTc of 541m  Assessment/Plan:   Present on Admission: . Scrotum swelling . Hyperglycemia  Principal Problem:   Scrotum swelling Active Problems:   CAD (coronary artery disease)   Hyperglycemia   CHF (congestive heart failure) (HCC)   Noncompliance   Peripheral edema   Chronic peripheral  edema and scrotal swelling in the setting of CHF Enlarged left inguinal lymph node Patient has had recurrent visits to ED due to same complaints and was noted to have left AMA on several occasions. Persistent peripheral edema/scrotal swelling may be due to the noncompliance  Continue IV Lasix 40 mg twice daily (with plan to increase to 844mBID if tolerated by BP) Continue aspirin, Lipitor, Entresto, Toprol Continue total input/output, daily weights and fluid restriction Continue Cardiac diet  Echocardiogram done on 07/24/2020 showed LVEF of 60 to 70% and grade 3 diastolic dysfunction Bilateral lower extremity DVT done on 07/19/2020 showed no evidence of DVT within either lower extremity, though enlarged left inguinal lymph nodes, not seen on previous lower extremity venous Doppler ultrasound performed 07/2019, nonspecific and while potentially reactive in etiology, malignancy is not excluded on the basis of this examination.  CT scan of the abdomen and pelvis recommended  Hyperglycemia secondary to poorly controlled type 2 diabetes mellitus Subcu insulin titration essential was given in the ED Continue insulin sliding scale and hypoglycemic protocol  CAD s/p stent placement Continue aspirin, Eliquis, Lipitor, Toprol  GERD Continue famotidine  COPD (not in acute exacerbation) Continue Ventolin, Dulera  History of noncompliance with medication regimen Patient counseled on importance of being compliant with medication regimen   Echocardiogram done on 07/24/2020 IMPRESSIONS   1. Left ventricular ejection fraction, by estimation, is 60 to 65%. The  left ventricle has normal function. The left ventricle has no regional  wall motion abnormalities. There is moderate concentric left ventricular  hypertrophy. Left ventricular  diastolic parameters are consistent with Grade III diastolic dysfunction  (restrictive).  2. Right ventricular systolic function is normal. The right ventricular   size is normal.  3. The mitral valve is normal in structure. Mild mitral valve  regurgitation.  4. The aortic valve is grossly normal. There is mild calcification of the  aortic valve. There is mild thickening  of the aortic valve. Aortic valve  regurgitation is trivial.  5. The inferior vena cava is dilated in size with <50% respiratory  variability, suggesting right atrial pressure of 15 mmHg.   Comparison(s): No significant change from prior study.   DVT prophylaxis: Eliquis  Code Status: Full code  Family Communication: None at bedside  Disposition Plan:  Patient is from:                        home Anticipated DC to:                   home Anticipated DC date:                1 day  Anticipated DC barriers:           Patient was unstable to be discharged at this time due to peripheral edema and scrotal swelling which require inpatient management  Consults called: None  Admission status: Observation    Bernadette Hoit MD [facility]  09/27/2020, 12:25 AM

## 2020-09-26 NOTE — ED Provider Notes (Signed)
[facility] EMERGENCY DEPARTMENT Provider Note   CSN: [id number] Arrival date & time: 09/26/20  1756     History Chief Complaint  Patient presents with  . Groin Swelling    Dustin Cruz is a 54 y.o. male.  Patient complains of swelling to his groin.  Patient has a history of heart failure  The history is provided by the patient and medical records. No language interpreter was used.  Weakness Severity:  Moderate Onset quality:  Sudden Timing:  Constant Progression:  Worsening Chronicity:  Recurrent Context: not alcohol use   Relieved by:  Nothing Worsened by:  Nothing Ineffective treatments:  None tried Associated symptoms: no abdominal pain, no chest pain, no cough, no diarrhea, no frequency, no headaches and no seizures        Past Medical History:  Diagnosis Date  . Anxiety   . Asthma   . CKD stage 3 due to type 2 diabetes mellitus (HCC)   . COPD (chronic obstructive pulmonary disease) (HCC)   . Coronary artery disease    s/p BMS to [place] 10/2010;  Cath 01/22/12 prox 30-40% LAD, LCx ramus w/ hazy 70-80% in-stent restenosis, EF 60% treated medically  . Coronary artery disease    s/p BMS to [facility] 10/2010;  Cath 01/22/12 prox 30-40% LAD, LCx ramus w/ hazy 70-80% in-stent restenosis, EF 60% treated medically   . Depression   . Diabetes mellitus    Type 2  . Gastroparesis 05/2013  . GERD (gastroesophageal reflux disease)   . HTN (hypertension)   . Hyperlipidemia   . Hyperlipidemia   . Major depression, chronic 08/13/2012  . Melanoma (HCC) 2007   surgery at [facility], Followed by [place]  . Myocardial infarction (HCC) 2012  . Obesity   . Tobacco abuse   . Tubular adenoma   . Urinary retention     Patient Active Problem List   Diagnosis Date Noted  . Acute diastolic CHF (congestive heart failure) (HCC) 07/23/2020  . Abnormal weight gain 07/23/2020  . Noncompliance 07/23/2020  . Pelvic lymphadenopathy 07/20/2020  . Acute on chronic diastolic CHF (congestive  heart failure) (HCC) 06/30/2020  . CHF (congestive heart failure) (HCC) 06/29/2020  . Pre-ulcerative calluses 10/29/2019  . Musculoskeletal pain 10/13/2019  . Housing problems 05/23/2019  . Essential hypertension, benign 03/20/2019  . 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 Near syncope 03/17/2019  . AKI (acute kidney injury) (HCC) 03/17/2019  . 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 CKD stage 3 due to type 2 diabetes mellitus (HCC) 11/28/2018  . Microalbuminuria due to type 2 diabetes mellitus (HCC) 05/22/2018  . Hyperglycemia 04/24/2018  . Abdominal pain, epigastric 11/29/2017  . Diarrhea 11/29/2017  . History of colonic polyps 11/29/2017  . Diffuse abdominal pain 11/29/2017  . Diabetic retinopathy (HCC) 09/21/2015  . Carotid bruit 09/21/2015  . Viral gastroenteritis 06/03/2015  . Hypertriglyceridemia 05/18/2015  . Vitamin D deficiency 12/29/2013  . Nausea alone 04/24/2013  . Frequency of urination 04/14/2013  . Fever, unspecified 04/14/2013  . Hepatic steatosis 12/28/2012  . Peripheral neuropathy 11/25/2012  . Melanoma of skin, site unspecified 11/25/2012  . Mixed hyperlipidemia   . Depression   . Hypertension associated with diabetes (HCC)   . GERD (gastroesophageal reflux disease) 04/03/2012  . Current smoker 03/14/2012  . Type 2 diabetes mellitus with stage 3a chronic kidney disease, with long-term current use of insulin (HCC)   . 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 BIOPSY  01/07/2018  Procedure: BIOPSY;  Surgeon: Daneil Dolin, MD;  Location: [facility];  Service: Endoscopy;;  duodenal and gastric biopsy  . BIOPSY  04/25/2018   Procedure: BIOPSY;  Surgeon: Daneil Dolin, MD;  Location: [facility];  Service: Endoscopy;;  ascending and descending and sigmoid biopsies  . CARDIAC CATHETERIZATION     with stent  . COLONOSCOPY N/A 01/27/2013   DEY:CXKGYJ and colonic polyps. Tubular adenomas, poor  bowel prep, one-year follow-up surveillance colonoscopy recommended  . COLONOSCOPY WITH PROPOFOL N/A 04/25/2018   Procedure: COLONOSCOPY WITH PROPOFOL;  Surgeon: Daneil Dolin, MD;  Location: [facility];  Service: Endoscopy;  Laterality: N/A;  11:00am - pt to be prepped INPT and labs to be done as well  . CORONARY ANGIOPLASTY WITH STENT PLACEMENT    . ESOPHAGOGASTRODUODENOSCOPY  04/24/2012   [place]k-mild erosive reflux esophagitis,dilated w/78F Venia Minks, small HH, minimal chronic gastric/bulbar erosions(No H pylori)  . ESOPHAGOGASTRODUODENOSCOPY (EGD) WITH PROPOFOL N/A 01/07/2018   Dr. Gala Romney: Erythematous mucosa in the stomach, retained gastric contents, incomplete exam.  Biopsied showed mild chronic gastritis but no H. pylori.  Duodenal biopsy was negative for celiac disease.  [name] FLEXIBLE SIGMOIDOSCOPY N/A 01/07/2018   Dr. Gala Romney: Incomplete colonoscopy due to inadequate bowel prep  . LEFT HEART CATHETERIZATION WITH CORONARY ANGIOGRAM N/A 01/22/2012   Procedure: LEFT HEART CATHETERIZATION WITH CORONARY ANGIOGRAM;  Surgeon: Jolaine Artist, MD;  Location: [facility] CATH LAB;  Service: Cardiovascular;  Laterality: N/A;  . melanoma surgery  2007   [place], removed lymph nodes under arm as well,Left abd       Family History  Problem Relation Age of Onset  . Stroke Mother   . Alcohol abuse Father   . Heart disease Other   . Other Other        not real familiar with family history  . Lung cancer Other   . Colon cancer Neg Hx   . Colon polyps Neg Hx     Social History   Tobacco Use  . Smoking status: Current Every Day Smoker    Packs/day: 1.00    Years: 31.00    [place] years: 31.00    Types: Cigarettes    Start date: 10/09/1981  . Smokeless tobacco: Never Used  . Tobacco comment: 1/2 [place] daily 11/01/17  Vaping Use  . Vaping Use: Never used  Substance Use Topics  . 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  . Drug use: No    Home Medications Prior to Admission medications   Medication Sig Start Date End Date Taking? Authorizing Provider  albuterol (PROVENTIL) (2.5 MG/3ML) 0.083% nebulizer solution Take 3 mLs (2.5 mg total) by nebulization every 6 (six) hours as needed for wheezing or shortness of breath. Dx---44.1 02/16/20  Yes Ronnie Doss M, DO  albuterol (VENTOLIN HFA) 108 (90 Base) MCG/ACT inhaler 2 puffs every 6 hours prn 07/01/20  Yes Gottschalk, Ashly M, DO  aspirin EC 81 MG tablet Take 1 tablet (81 mg total) by mouth daily with breakfast. 03/21/19  Yes Emokpae, Courage, MD  atorvastatin (LIPITOR) 80 MG tablet Take 1 tablet (80 mg total) by mouth daily. 07/01/20  Yes Gottschalk, Leatrice Jewels M, DO  busPIRone (BUSPAR) 7.5 MG tablet Take 1 tablet by mouth twice daily 09/06/20  Yes Gottschalk, Ashly M, DO  ELIQUIS 5 MG TABS tablet Take 1 tablet by mouth  twice daily 09/22/20  Yes Strader, [place] M, PA-C  ENTRESTO 24-26 MG Take 1 tablet by mouth 2 (two) times daily. 08/01/20  Yes [provider]  famotidine (PEPCID) 20 MG tablet Take 20 mg by mouth 2 (two) times daily. 12/22/19  Yes [provider]  furosemide (LASIX) 80 MG tablet Take 1 tablet (80 mg total) by mouth 2 (two) times daily. 16/1/09  Yes Delora Fuel, MD  gabapentin (NEURONTIN) 600 MG tablet Take 1 tablet (600 mg total) by mouth 3 (three) times daily. 07/01/20  Yes Gottschalk, Leatrice Jewels M, DO  insulin glargine (LANTUS SOLOSTAR) 100 UNIT/ML Solostar Pen Inject 60 Units into the skin every evening. 07/01/20  Yes Gottschalk, Ashly M, DO  lisinopril (ZESTRIL) 10 MG tablet Take 1 tablet (10 mg total) by mouth at bedtime. 07/01/20  Yes Ronnie Doss M, DO  metoprolol succinate (TOPROL-XL) 25 MG 24 hr tablet Take 12.5 mg by mouth daily. 07/30/20  Yes [provider]  mometasone-formoterol (DULERA) 200-5 MCG/ACT AERO Inhale 2 puffs into the lungs in the morning and at bedtime. 07/20/20   Yes Gottschalk, Leatrice Jewels M, DO  nitroGLYCERIN (NITROSTAT) 0.4 MG SL tablet Place 1 tablet (0.4 mg total) under the tongue every 5 (five) minutes as needed. For chest pain. 08/13/12  Yes Reece Packer, NP  oxyCODONE (OXY IR/ROXICODONE) 5 MG immediate release tablet Take 5 mg by mouth 2 (two) times daily as needed. 09/26/20  Yes [provider]  potassium chloride (KLOR-CON) 10 MEQ tablet Take 10 mEq by mouth 2 (two) times daily. 09/16/20  Yes [provider]  tiZANidine (ZANAFLEX) 4 MG tablet Take 4-8 mg by mouth every 6 (six) hours as needed for muscle spasms.   Yes [provider]  traZODone (DESYREL) 150 MG tablet TAKE 1 TABLET BY MOUTH ONCE DAILY AT BEDTIME 08/31/20  Yes Gottschalk, Ashly M, DO  acetaminophen (TYLENOL) 325 MG tablet Take 2 tablets (650 mg total) by mouth every 6 (six) hours as needed for mild pain, moderate pain or fever (or Fever >/= 101). 03/21/19   Roxan Hockey, MD  Blood Glucose Monitoring Suppl (ACCU-CHEK GUIDE) w/Device KIT 1 Piece by Does not apply route as directed. 06/30/20   Claretta Fraise, MD  glucose blood (ACCU-CHEK GUIDE) test strip Use as instructed 07/01/20   Ronnie Doss M, DO  metFORMIN (GLUCOPHAGE) 500 MG tablet Take 1 tablet (500 mg total) by mouth 2 (two) times daily with a meal. Patient not taking: No sig reported 11/12/19   Cassandria Anger, MD  metoprolol tartrate (LOPRESSOR) 25 MG tablet Take 25 mg by mouth 2 (two) times daily.    12/27/11  [provider]    Allergies    Patient has no known allergies.  Review of Systems   Review of Systems  Constitutional: Negative for appetite change and fatigue.  HENT: Negative for congestion, ear discharge and sinus pressure.   Eyes: Negative for discharge.  Respiratory: Negative for cough.   Cardiovascular: Negative for chest pain.  Gastrointestinal: Negative for abdominal pain and diarrhea.  Genitourinary: Negative for frequency and hematuria.  Musculoskeletal:  Negative for back pain.       Edema in the legs and scrotum  Skin: Negative for rash.  Neurological: Positive for weakness. Negative for seizures and headaches.  Psychiatric/Behavioral: Negative for hallucinations.    Physical Exam Updated Vital Signs BP (!) 134/91   Pulse 75   Temp 98.2 F (36.8 C) (Oral)   Resp 19   Ht $R'6\' 2"'Cp$  (1.88 m)  Wt 97.1 kg   SpO2 99%   BMI 27.48 kg/m   Physical Exam Vitals and nursing note reviewed.  Constitutional:      Appearance: He is well-developed.  HENT:     Head: Normocephalic.     Nose: Nose normal.  Eyes:     General: No scleral icterus.    Extraocular Movements: EOM normal.     Conjunctiva/sclera: Conjunctivae normal.  Neck:     Thyroid: No thyromegaly.  Cardiovascular:     Rate and Rhythm: Normal rate and regular rhythm.     Heart sounds: No murmur heard. No friction rub. No gallop.   Pulmonary:     Breath sounds: No stridor. No wheezing or rales.  Chest:     Chest wall: No tenderness.  Abdominal:     General: There is no distension.     Tenderness: There is no abdominal tenderness. There is no rebound.  Musculoskeletal:        General: No edema. Normal range of motion.     Cervical back: Neck supple.     Comments: Patient with significant edema in the scrotum and significant edema left leg.  Patient has edema all the way up to his umbilicus  Lymphadenopathy:     Cervical: No cervical adenopathy.  Skin:    Findings: No erythema or rash.  Neurological:     Mental Status: He is alert and oriented to person, place, and time.     Motor: No abnormal muscle tone.     Coordination: Coordination normal.  Psychiatric:        Mood and Affect: Mood and affect normal.        Behavior: Behavior normal.     ED Results / Procedures / Treatments   Labs (all labs ordered are listed, but only abnormal results are displayed) Labs Reviewed  CBC WITH DIFFERENTIAL/PLATELET - Abnormal; Notable for the following components:      Result  Value   Platelets 437 (*)    All other components within normal limits  COMPREHENSIVE METABOLIC PANEL - Abnormal; Notable for the following components:   Sodium 126 (*)    Chloride 85 (*)    Glucose, Bld 518 (*)    Creatinine, Ser 1.31 (*)    Albumin 3.1 (*)    ALT 46 (*)    Alkaline Phosphatase 185 (*)    All other components within normal limits  BRAIN NATRIURETIC PEPTIDE - Abnormal; Notable for the following components:   B Natriuretic Peptide 1,979.0 (*)    All other components within normal limits  CBG MONITORING, ED - Abnormal; Notable for the following components:   Glucose-Capillary 569 (*)    All other components within normal limits  CBG MONITORING, ED - Abnormal; Notable for the following components:   Glucose-Capillary 502 (*)    All other components within normal limits  CBG MONITORING, ED - Abnormal; Notable for the following components:   Glucose-Capillary 427 (*)    All other components within normal limits    EKG None  Radiology DG Chest Port 1 View  Result Date: 09/26/2020 CLINICAL DATA:  Testicular swelling, COPD, weakness EXAM: PORTABLE CHEST 1 VIEW COMPARISON:  09/15/2020 FINDINGS: Single frontal view of the chest demonstrates a stable cardiac silhouette. No acute airspace disease, effusion, or pneumothorax. Mild central vascular congestion unchanged. No acute bony abnormalities. IMPRESSION: 1. Chronic central vascular congestion without evidence of edema. Electronically Signed   By: Randa Ngo M.D.   On: 09/26/2020 20:14  Procedures Procedures (including critical care time)  Medications Ordered in ED Medications  insulin aspart (novoLOG) injection 20 Units (20 Units Subcutaneous Given 09/26/20 2024)    ED Course  I have reviewed the triage vital signs and the nursing notes.  Pertinent labs & imaging results that were available during my care of the patient were reviewed by me and considered in my medical decision making (see chart for  details).    MDM Rules/Calculators/A&P                          Patient has anasarca BNP is close to 2000.  He will be admitted for diuresis.  Patient also has poorly controlled glucose Final Clinical Impression(s) / ED Diagnoses Final diagnoses:  Anasarca    Rx / DC Orders ED Discharge Orders    None       Milton Ferguson, MD 09/26/20 2256

## 2020-09-26 NOTE — ED Notes (Signed)
Pt refusing to keep on ekg leads, blood pressure cuff and o2 monitor.

## 2020-09-26 NOTE — ED Notes (Signed)
Date and time results received: 09/26/20 9:11 PM  Test: Glucose Critical Value: 518  Name of Provider Notified: [place] Orders Received? Or Actions Taken?: See MAR

## 2020-09-26 NOTE — ED Triage Notes (Signed)
Pt c/o testicular swelling that began Friday. Pt to the ED for evaluation.

## 2020-09-26 NOTE — ED Notes (Signed)
ED Provider at bedside. 

## 2020-09-27 ENCOUNTER — Observation Stay (HOSPITAL_COMMUNITY): Payer: 59

## 2020-09-27 DIAGNOSIS — Z86010 Personal history of colonic polyps: Secondary | ICD-10-CM | POA: Diagnosis not present

## 2020-09-27 DIAGNOSIS — I5043 Acute on chronic combined systolic (congestive) and diastolic (congestive) heart failure: Secondary | ICD-10-CM | POA: Diagnosis present

## 2020-09-27 DIAGNOSIS — I2510 Atherosclerotic heart disease of native coronary artery without angina pectoris: Secondary | ICD-10-CM | POA: Diagnosis present

## 2020-09-27 DIAGNOSIS — E1122 Type 2 diabetes mellitus with diabetic chronic kidney disease: Secondary | ICD-10-CM | POA: Diagnosis present

## 2020-09-27 DIAGNOSIS — I5023 Acute on chronic systolic (congestive) heart failure: Secondary | ICD-10-CM

## 2020-09-27 DIAGNOSIS — E1165 Type 2 diabetes mellitus with hyperglycemia: Secondary | ICD-10-CM | POA: Diagnosis present

## 2020-09-27 DIAGNOSIS — J449 Chronic obstructive pulmonary disease, unspecified: Secondary | ICD-10-CM | POA: Diagnosis present

## 2020-09-27 DIAGNOSIS — F17210 Nicotine dependence, cigarettes, uncomplicated: Secondary | ICD-10-CM | POA: Diagnosis present

## 2020-09-27 DIAGNOSIS — E785 Hyperlipidemia, unspecified: Secondary | ICD-10-CM | POA: Diagnosis present

## 2020-09-27 DIAGNOSIS — Z5329 Procedure and treatment not carried out because of patient's decision for other reasons: Secondary | ICD-10-CM | POA: Diagnosis not present

## 2020-09-27 DIAGNOSIS — N5089 Other specified disorders of the male genital organs: Secondary | ICD-10-CM | POA: Diagnosis present

## 2020-09-27 DIAGNOSIS — N1831 Chronic kidney disease, stage 3a: Secondary | ICD-10-CM | POA: Diagnosis present

## 2020-09-27 DIAGNOSIS — I252 Old myocardial infarction: Secondary | ICD-10-CM | POA: Diagnosis not present

## 2020-09-27 DIAGNOSIS — Z955 Presence of coronary angioplasty implant and graft: Secondary | ICD-10-CM | POA: Diagnosis not present

## 2020-09-27 DIAGNOSIS — R601 Generalized edema: Secondary | ICD-10-CM | POA: Diagnosis present

## 2020-09-27 DIAGNOSIS — Z9119 Patient's noncompliance with other medical treatment and regimen: Secondary | ICD-10-CM | POA: Diagnosis not present

## 2020-09-27 DIAGNOSIS — Z823 Family history of stroke: Secondary | ICD-10-CM | POA: Diagnosis not present

## 2020-09-27 DIAGNOSIS — Z20822 Contact with and (suspected) exposure to covid-19: Secondary | ICD-10-CM | POA: Diagnosis present

## 2020-09-27 DIAGNOSIS — R609 Edema, unspecified: Secondary | ICD-10-CM

## 2020-09-27 DIAGNOSIS — Z811 Family history of alcohol abuse and dependence: Secondary | ICD-10-CM | POA: Diagnosis not present

## 2020-09-27 DIAGNOSIS — Z794 Long term (current) use of insulin: Secondary | ICD-10-CM | POA: Diagnosis not present

## 2020-09-27 DIAGNOSIS — I130 Hypertensive heart and chronic kidney disease with heart failure and stage 1 through stage 4 chronic kidney disease, or unspecified chronic kidney disease: Secondary | ICD-10-CM | POA: Diagnosis not present

## 2020-09-27 DIAGNOSIS — R600 Localized edema: Secondary | ICD-10-CM

## 2020-09-27 DIAGNOSIS — K219 Gastro-esophageal reflux disease without esophagitis: Secondary | ICD-10-CM | POA: Diagnosis present

## 2020-09-27 DIAGNOSIS — Z85820 Personal history of malignant melanoma of skin: Secondary | ICD-10-CM | POA: Diagnosis not present

## 2020-09-27 DIAGNOSIS — Z9114 Patient's other noncompliance with medication regimen: Secondary | ICD-10-CM | POA: Diagnosis not present

## 2020-09-27 DIAGNOSIS — D75839 Thrombocytosis, unspecified: Secondary | ICD-10-CM | POA: Diagnosis present

## 2020-09-27 DIAGNOSIS — Z801 Family history of malignant neoplasm of trachea, bronchus and lung: Secondary | ICD-10-CM | POA: Diagnosis not present

## 2020-09-27 LAB — COMPREHENSIVE METABOLIC PANEL
ALT: 40 U/L (ref 0–44)
AST: 38 U/L (ref 15–41)
Albumin: 2.7 g/dL — ABNORMAL LOW (ref 3.5–5.0)
Alkaline Phosphatase: 157 U/L — ABNORMAL HIGH (ref 38–126)
Anion gap: 11 (ref 5–15)
BUN: 16 mg/dL (ref 6–20)
CO2: 31 mmol/L (ref 22–32)
Calcium: 8.6 mg/dL — ABNORMAL LOW (ref 8.9–10.3)
Chloride: 86 mmol/L — ABNORMAL LOW (ref 98–111)
Creatinine, Ser: 1.16 mg/dL (ref 0.61–1.24)
GFR, Estimated: >60 mL/min (ref >60)
Glucose, Bld: 116 mg/dL — ABNORMAL HIGH (ref 70–99)
Potassium: 3.7 mmol/L (ref 3.5–5.1)
Sodium: 128 mmol/L — ABNORMAL LOW (ref 135–145)
Total Bilirubin: 0.7 mg/dL (ref 0.3–1.2)
Total Protein: 6.7 g/dL (ref 6.5–8.1)

## 2020-09-27 LAB — PROTIME-INR
INR: 1.1 (ref 0.8–1.2)
Prothrombin Time: 13.4 seconds (ref 11.4–15.2)

## 2020-09-27 LAB — MAGNESIUM: Magnesium: 1.9 mg/dL (ref 1.7–2.4)

## 2020-09-27 LAB — CBG MONITORING, ED
Glucose-Capillary: 72 mg/dL (ref 70–99)
Glucose-Capillary: 87 mg/dL (ref 70–99)

## 2020-09-27 LAB — GLUCOSE, CAPILLARY
Glucose-Capillary: 341 mg/dL — ABNORMAL HIGH (ref 70–99)
Glucose-Capillary: 189 mg/dL — ABNORMAL HIGH (ref 70–99)
Glucose-Capillary: 319 mg/dL — ABNORMAL HIGH (ref 70–99)
Glucose-Capillary: 251 mg/dL — ABNORMAL HIGH (ref 70–99)
Glucose-Capillary: 220 mg/dL — ABNORMAL HIGH (ref 70–99)

## 2020-09-27 LAB — APTT: aPTT: 29 seconds (ref 24–36)

## 2020-09-27 LAB — HEMOGLOBIN A1C
Hgb A1c MFr Bld: 14 % — ABNORMAL HIGH (ref 4.8–5.6)
Mean Plasma Glucose: 355.1 mg/dL

## 2020-09-27 LAB — CBC
HCT: 42.8 % (ref 39.0–52.0)
Hemoglobin: 13.9 g/dL (ref 13.0–17.0)
MCH: 31 pg (ref 26.0–34.0)
MCHC: 32.5 g/dL (ref 30.0–36.0)
MCV: 95.5 fL (ref 80.0–100.0)
Platelets: 458 10*3/uL — ABNORMAL HIGH (ref 150–400)
RBC: 4.48 MIL/uL (ref 4.22–5.81)
RDW: 14.4 % (ref 11.5–15.5)
WBC: 9.3 10*3/uL (ref 4.0–10.5)
nRBC: 0 % (ref 0.0–0.2)

## 2020-09-27 LAB — RESP PANEL BY RT-PCR (FLU A&B, COVID) ARPGX2
Influenza A by PCR: NEGATIVE
Influenza B by PCR: NEGATIVE
SARS Coronavirus 2 by RT PCR: NEGATIVE

## 2020-09-27 LAB — PHOSPHORUS: Phosphorus: 3.8 mg/dL (ref 2.5–4.6)

## 2020-09-27 MED ORDER — FAMOTIDINE 20 MG PO TABS
20.0000 mg | ORAL_TABLET | Freq: Two times a day (BID) | ORAL | Status: DC
  Administered 2020-09-27 (×3): 20 mg via ORAL
  Filled 2020-09-27 (×3): qty 1

## 2020-09-27 MED ORDER — FUROSEMIDE 10 MG/ML IJ SOLN
60.0000 mg | Freq: Two times a day (BID) | INTRAMUSCULAR | Status: DC
  Administered 2020-09-27: 12:00:00 60 mg via INTRAVENOUS
  Filled 2020-09-27: qty 6

## 2020-09-27 MED ORDER — APIXABAN 5 MG PO TABS
5.0000 mg | ORAL_TABLET | Freq: Two times a day (BID) | ORAL | Status: DC
  Administered 2020-09-27 (×3): 5 mg via ORAL
  Filled 2020-09-27 (×3): qty 1

## 2020-09-27 MED ORDER — MOMETASONE FURO-FORMOTEROL FUM 200-5 MCG/ACT IN AERO
2.0000 | INHALATION_SPRAY | Freq: Two times a day (BID) | RESPIRATORY_TRACT | Status: DC
  Administered 2020-09-27 (×2): 2 via RESPIRATORY_TRACT
  Filled 2020-09-27: qty 8.8

## 2020-09-27 MED ORDER — INSULIN ASPART 100 UNIT/ML ~~LOC~~ SOLN: 0.0000 [IU] | Freq: Every day | SUBCUTANEOUS | Status: DC

## 2020-09-27 MED ORDER — OXYCODONE-ACETAMINOPHEN 5-325 MG PO TABS
1.0000 | ORAL_TABLET | Freq: Once | ORAL | Status: AC
  Administered 2020-09-27: 02:00:00 1 via ORAL
  Filled 2020-09-27: qty 1

## 2020-09-27 MED ORDER — INSULIN GLARGINE 100 UNIT/ML ~~LOC~~ SOLN
30.0000 [IU] | Freq: Every day | SUBCUTANEOUS | Status: DC
  Filled 2020-09-27: qty 0.3

## 2020-09-27 MED ORDER — ALBUTEROL SULFATE HFA 108 (90 BASE) MCG/ACT IN AERS: 2.0000 | INHALATION_SPRAY | Freq: Four times a day (QID) | RESPIRATORY_TRACT | Status: DC | PRN

## 2020-09-27 MED ORDER — INSULIN ASPART 100 UNIT/ML ~~LOC~~ SOLN
0.0000 [IU] | Freq: Three times a day (TID) | SUBCUTANEOUS | Status: DC
  Administered 2020-09-27: 08:00:00 11 [IU] via SUBCUTANEOUS
  Administered 2020-09-27: 12:00:00 3 [IU] via SUBCUTANEOUS
  Administered 2020-09-27: 17:00:00 11 [IU] via SUBCUTANEOUS

## 2020-09-27 MED ORDER — METOPROLOL SUCCINATE ER 25 MG PO TB24
12.5000 mg | ORAL_TABLET | Freq: Every day | ORAL | Status: DC
  Administered 2020-09-27: 08:00:00 12.5 mg via ORAL
  Filled 2020-09-27: qty 1

## 2020-09-27 MED ORDER — IOHEXOL 9 MG/ML PO SOLN
ORAL | Status: AC
  Administered 2020-09-27: 02:00:00 500 mL
  Filled 2020-09-27: qty 1000

## 2020-09-27 MED ORDER — OXYCODONE-ACETAMINOPHEN 5-325 MG PO TABS
1.0000 | ORAL_TABLET | Freq: Four times a day (QID) | ORAL | Status: DC | PRN
  Administered 2020-09-27 (×2): 1 via ORAL
  Filled 2020-09-27 (×2): qty 1

## 2020-09-27 MED ORDER — HYDROMORPHONE HCL 1 MG/ML IJ SOLN
0.5000 mg | Freq: Once | INTRAMUSCULAR | Status: AC
  Administered 2020-09-27: 17:00:00 0.5 mg via INTRAVENOUS
  Filled 2020-09-27: qty 0.5

## 2020-09-27 MED ORDER — SACUBITRIL-VALSARTAN 24-26 MG PO TABS
1.0000 | ORAL_TABLET | Freq: Two times a day (BID) | ORAL | Status: DC
  Administered 2020-09-27 (×3): 1 via ORAL
  Filled 2020-09-27 (×3): qty 1

## 2020-09-27 MED ORDER — ASPIRIN EC 81 MG PO TBEC
81.0000 mg | DELAYED_RELEASE_TABLET | Freq: Every day | ORAL | Status: DC
  Administered 2020-09-27: 08:00:00 81 mg via ORAL
  Filled 2020-09-27: qty 1

## 2020-09-27 MED ORDER — ATORVASTATIN CALCIUM 40 MG PO TABS
80.0000 mg | ORAL_TABLET | Freq: Every day | ORAL | Status: DC
  Administered 2020-09-27: 08:00:00 80 mg via ORAL
  Filled 2020-09-27: qty 2

## 2020-09-27 MED ORDER — IOHEXOL 300 MG/ML  SOLN
100.0000 mL | Freq: Once | INTRAMUSCULAR | Status: AC | PRN
  Administered 2020-09-27: 04:00:00 100 mL via INTRAVENOUS

## 2020-09-27 MED ORDER — INSULIN ASPART 100 UNIT/ML ~~LOC~~ SOLN
8.0000 [IU] | Freq: Three times a day (TID) | SUBCUTANEOUS | Status: DC
  Administered 2020-09-27: 17:00:00 8 [IU] via SUBCUTANEOUS

## 2020-09-27 NOTE — Progress Notes (Signed)
Pt requesting to leave against medical advice. Explained to pt that he is not medically cleared. AMA form explained to patient. Pt agrees to sign. IV removed. Pt walks off unit with security.  Dr Josephine Cables and charge nurse notified.

## 2020-09-27 NOTE — Progress Notes (Signed)
PROGRESS NOTE    Dustin Cruz  WCB:[id number] DOB: 05-13-1966 DOA: 09/26/2020 PCP: [name], DO   Chief Complaint  Patient presents with  . Groin Swelling    Brief Narrative:  As per H&P written by Dr. Josephine Cables on 09/26/2020  Dustin Cruz is a 54 y.o. male with medical history significant for CAD s/p stent placement, COPD, Type 2 Diabetes Mellitus, history of noncompliance with medication who presents to the emergency department due to complaint of swelling of his scrotum and penis which has been going on for several days.  Patient was seen for same complaint in the ED 1 week ago (12/12) as well as for elevated blood glucose levels that was treated with IV insulin.  Patient returned to the ED on 12/17 for peripheral edema and chronic rash on legs.  Patient states that he has been compliant with his home Lasix and diet.  He denies chest pain, shortness of breath or any discomfort in urinating despite the scrotal and penile swelling.  ED Course: In the emergency department, he was hemodynamically stable.  Work-up in the ED showed thrombocytosis, hyperglycemia, ALT 46, ALP 185, albumin 3.1.  Chest x-ray showed chronic central vascular congestion without evidence of edema.  20 units of insulin subcu was given.  Hospitalist was asked to admit patient for further evaluation and management.  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 -Still complaining of significant distention, pain, fluid buildup. -Patient educated and instructed on the importance of medication compliance and low-sodium diet. -Continue strict I's and O's and daily weights -Continue IV diuresis -Continue Entresto and Toprol.  2-type 2 diabetes chronically on insulin with hyperglycemia and poor control -Continue sliding scale insulin -Continue adjusted dose of long-acting insulin at nighttime -Modified carbohydrate diet has been  ordered. -NovoLog 3 times daily for meal coverage will be also provided.  3-hyperlipidemia -Continue statins  4-history of CAD (coronary artery disease) -No chest pain: -Continue telemetry monitoring -Continue statins, beta-blocker and aspirin  5-GERD -continue Pepcid.  6-hx of COPD -No wheezing currently. -Continue the use of Dulera -will continue As needed bronchodilators.  DVT prophylaxis: Eliquis Code Status: Full code Family Communication: No family at bedside.  Patient expressed that he will updated them by himself. Disposition:   Status is: Inpatient  Dispo: The patient is from: Home              Anticipated d/c is to: Home              Anticipated d/c date is: 48 to 72 hours.              Patient currently no medically stable for discharge; still with significant hypervolemia status/anasarca.  Continue IV diuresis and closely follow electrolytes for repletion.       Consultants:   None   Procedures:  See below for x-ray reports   Antimicrobials:  None   Subjective: No chest pain, no nausea, no vomiting.  Reports significant still ongoing swelling in his scrotum legs and abdomen.  Positive anasarca findings and increased abdominal girth.  Patient expressing pain in his lower abdomen and scrotum from swelling/distention.  Objective: Vitals:   09/27/20 0611 09/27/20 0809 09/27/20 0813 09/27/20 1206  BP: 121/78  115/73 (!) 105/57  Pulse: 89  80 96  Resp: 20  19 18   Temp: 97.9 F (36.6 C)   (!) 97.4 F (36.3 C)  TempSrc: Oral   Oral  SpO2: 99% 98% 95% 98%  Weight:      Height:        Intake/Output Summary (Last 24 hours) at 09/27/2020 1341 Last data filed at 09/27/2020 0945 Gross per 24 hour  Intake 240 ml  Output --  Net 240 ml   Filed Weights   09/26/20 1850  Weight: 97.1 kg    Examination:  General exam: Appears calm and complaining severe pain secondary to scrotal swelling and ongoing hypervolemia/anasarca.  Patient denies chest  pain, no nausea, no vomiting.  Currently requiring oxygen supplementation and expressing very little dyspnea on exertion. Respiratory system: Decreased breath sounds at the bases; no using accessory muscles.  No wheezing, normal respiratory effort.  Good O2 sat on room air. Cardiovascular system: Rate controlled, no rubs, no gallops, no JVD on exam.  3+ edema appreciated bilaterally, with ongoing significant scrotal swelling Gastrointestinal system: Abdomen is nontender to palpation; increased abdominal girth appreciated.  Positive bowel sounds.   Central nervous system: Alert and oriented. No focal neurological deficits. Extremities: No cyanosis or clubbing. Skin: No petechiae. Psychiatry: Judgement and insight appear normal. Mood & affect appropriate.     Data Reviewed: I have personally reviewed following labs and imaging studies  CBC: Recent Labs  Lab 09/26/20 2005 09/27/20 0312  WBC 8.5 9.3  NEUTROABS 6.2  --   HGB 14.5 13.9  HCT 45.0 42.8  MCV 95.9 95.5  PLT 437* 458*    Basic Metabolic Panel: Recent Labs  Lab 09/26/20 2005 09/27/20 0312  NA 126* 128*  K 4.2 3.7  CL 85* 86*  CO2 32 31  GLUCOSE 518* 116*  BUN 16 16  CREATININE 1.31* 1.16  CALCIUM 9.1 8.6*  MG  --  1.9  PHOS  --  3.8    GFR: Estimated Creatinine Clearance: 83.7 mL/min (by C-G formula based on SCr of 1.16 mg/dL).  Liver Function Tests: Recent Labs  Lab 09/26/20 2005 09/27/20 0312  AST 35 38  ALT 46* 40  ALKPHOS 185* 157*  BILITOT 0.7 0.7  PROT 7.7 6.7  ALBUMIN 3.1* 2.7*    CBG: Recent Labs  Lab 09/26/20 2115 09/27/20 0001 09/27/20 0037 09/27/20 0723 09/27/20 1132  GLUCAP 427* 72 87 341* 189*     Recent Results (from the past 240 hour(s))  Resp Panel by RT-PCR (Flu A&B, Covid) Nasopharyngeal Swab     Status: None   Collection Time: 09/26/20 11:10 PM   Specimen: Nasopharyngeal Swab; Nasopharyngeal(NP) swabs in vial transport medium  Result Value Ref Range Status   SARS  Coronavirus 2 by RT PCR NEGATIVE NEGATIVE Final    Comment: (NOTE) SARS-CoV-2 target nucleic acids are NOT DETECTED.  The SARS-CoV-2 RNA is generally detectable in upper respiratory specimens during the acute phase of infection. The lowest concentration of SARS-CoV-2 viral copies this assay can detect is 138 copies/mL. A negative result does not preclude SARS-Cov-2 infection and should not be used as the sole basis for treatment or other patient management decisions. A negative result may occur with  improper specimen collection/handling, submission of specimen other than nasopharyngeal swab, presence of viral mutation(s) within the areas targeted by this assay, and inadequate number of viral copies(<138 copies/mL). A negative result must be combined with clinical observations, patient history, and epidemiological information. The expected result is Negative.  Fact Sheet for Patients:  EntrepreneurPulse.com.au  Fact Sheet for Healthcare Providers:  IncredibleEmployment.be  This test is no t yet approved or cleared by the [place] FDA and  has  been authorized for detection and/or diagnosis of SARS-CoV-2 by FDA under an Emergency Use Authorization (EUA). This EUA will remain  in effect (meaning this test can be used) for the duration of the COVID-19 declaration under Section 564(b)(1) of the Act, 21 U.S.C.section 360bbb-3(b)(1), unless the authorization is terminated  or revoked sooner.       Influenza A by PCR NEGATIVE NEGATIVE Final   Influenza B by PCR NEGATIVE NEGATIVE Final    Comment: (NOTE) The Xpert Xpress SARS-CoV-2/FLU/RSV plus assay is intended as an aid in the diagnosis of influenza from Nasopharyngeal swab specimens and should not be used as a sole basis for treatment. Nasal washings and aspirates are unacceptable for Xpert Xpress SARS-CoV-2/FLU/RSV testing.  Fact Sheet for  Patients: EntrepreneurPulse.com.au  Fact Sheet for Healthcare Providers: IncredibleEmployment.be  This test is not yet approved or cleared by the [place] FDA and has been authorized for detection and/or diagnosis of SARS-CoV-2 by FDA under an Emergency Use Authorization (EUA). This EUA will remain in effect (meaning this test can be used) for the duration of the COVID-19 declaration under Section 564(b)(1) of the Act, 21 U.S.C. section 360bbb-3(b)(1), unless the authorization is terminated or revoked.  Performed at [facility], [address], [place], [place] [address]      Radiology Studies: CT ABDOMEN PELVIS W CONTRAST  Result Date: 09/27/2020 CLINICAL DATA:  Swelling of the scrotum and penis, seen 1 week prior for similar symptoms. EXAM: CT ABDOMEN AND PELVIS WITH CONTRAST TECHNIQUE: Multidetector CT imaging of the abdomen and pelvis was performed using the standard protocol following bolus administration of intravenous contrast. CONTRAST:  189mL OMNIPAQUE IOHEXOL 300 MG/ML  SOLN COMPARISON:  CT 05/30/2020, scrotal ultrasound 11/24/2016 (outside images, no report) FINDINGS: Lower chest: Small left and trace right pleural effusion. Small pericardial effusion. Cardiac size is top normal. Few coronary artery calcifications are present. Hepatobiliary: Heterogeneous attenuation of the liver without focal discernible liver lesion. Smooth liver surface contour. Physiologic distension of the gallbladder. Small gallstone layering dependently within the gallbladder. No frank gallbladder wall thickening or pericholecystic inflammation. No biliary ductal dilatation or intraductal gallstones. Pancreas: Partial fatty replacement of the pancreas. No pancreatic ductal dilatation or surrounding inflammatory changes. Spleen: Normal in size. No concerning splenic lesions. Adrenals/Urinary Tract: Normal adrenal glands. Kidneys are normally located with symmetric  enhancement and excretion. 1.4 cm fluid attenuation cyst seen in the interpolar left kidney. No suspicious renal lesion, urolithiasis or hydronephrosis. Circumferential bladder wall thickening, greater than expected for underdistention. Stomach/Bowel: Distal esophagus, stomach and duodenum are unremarkable. High attenuation enteric contrast material partially traverses the small bowel. No small bowel thickening or dilatation. No evidence of obstruction. Normal appendix in the right lower quadrant focal periappendiceal inflammation. No colonic dilatation or wall thickening. Vascular/Lymphatic: Atherosclerotic calcifications within the abdominal aorta and branch vessels. No aneurysm or ectasia. Enlarged centrally hypoattenuating nodes are present in the bilateral inguinal change, largest on the left measuring up to 19 mm short axis, largest on the right measuring up to 16 mm (2/90, 86). No other enlarged nodes seen in the abdomen or pelvis. Reproductive: The prostate and seminal vesicles are unremarkable. Extensive edematous changes about the mons pubis and included portions of the external genitalia encompassing only the base of the penis on this exam. No soft tissue gas, organized collection or abscess is seen. Other: Extensive severe circumferential body wall edema most pronounced towards the lower abdomen, pelvis and inguinal soft tissues and throughout the mons pubis. No organized collection or abscess is  seen. No soft tissue gas. Musculoskeletal: No acute osseous abnormality or suspicious osseous lesion. IMPRESSION: 1. Extensive edematous changes about the mons pubis and included portions of the external genitalia encompassing only the base of the penis on this exam. This could reflect normal distribution of edematous changes given additional features of anasarca with extreme edematous changes of the body wall. However, should correlate with exam findings to exclude a cellulitis or soft tissue infection. 2.  Enlarged centrally hypoattenuating nodes in the bilateral inguinal change, largest on the left measuring up to 19 mm short axis, largest on the right measuring up to 16 mm short axis. Findings are favored to be edematous or reactive though could consider clinical surveillance and reimaging if these persist post resolution of the acute symptoms. 3. Circumferential bladder wall thickening, greater than expected for underdistention. Recommend correlation with urinalysis to exclude cystitis. 4. Small left and trace right pleural effusion. Small pericardial effusion. 5. Heterogeneous attenuation of the liver without focal discernible liver lesion. Finding is nonspecific but could be seen in the setting of hepatic congestion in the setting of heart failure or intrinsic liver disease. Correlate with LFTs. 6. Cholelithiasis without evidence of acute cholecystitis. 7. Aortic Atherosclerosis (ICD10-I70.0). Electronically Signed   By: Lovena Le M.D.   On: 09/27/2020 04:08   DG Chest Port 1 View  Result Date: 09/26/2020 CLINICAL DATA:  Testicular swelling, COPD, weakness EXAM: PORTABLE CHEST 1 VIEW COMPARISON:  09/15/2020 FINDINGS: Single frontal view of the chest demonstrates a stable cardiac silhouette. No acute airspace disease, effusion, or pneumothorax. Mild central vascular congestion unchanged. No acute bony abnormalities. IMPRESSION: 1. Chronic central vascular congestion without evidence of edema. Electronically Signed   By: Randa Ngo M.D.   On: 09/26/2020 20:14    Scheduled Meds: . apixaban  5 mg Oral BID  . aspirin EC  81 mg Oral Q breakfast  . atorvastatin  80 mg Oral Daily  . famotidine  20 mg Oral BID  . furosemide  60 mg Intravenous Q12H  . insulin aspart  0-15 Units Subcutaneous TID WC  . insulin aspart  0-5 Units Subcutaneous QHS  . insulin glargine  30 Units Subcutaneous QHS  . metoprolol succinate  12.5 mg Oral Daily  . mometasone-formoterol  2 puff Inhalation BID  .  sacubitril-valsartan  1 tablet Oral BID   Continuous Infusions:   LOS: 0 days    Time spent: 30 minutes    Barton Dubois, MD [facility]   To contact the attending provider between 7A-7P or the covering provider during after hours 7P-7A, please log into the web site [email or web address] and access using universal [place] password for that web site. If you do not have the password, please call the hospital operator.  09/27/2020, 1:41 PM

## 2020-09-27 NOTE — Progress Notes (Signed)
Inpatient Diabetes Program Recommendations  AACE/ADA: New Consensus Statement on Inpatient Glycemic Control (2015)  Target Ranges:  Prepandial:   less than 140 mg/dL      Peak postprandial:   less than 180 mg/dL (1-2 hours)      Critically ill patients:  140 - 180 mg/dL   Lab Results  Component Value Date   GLUCAP 341 (H) 09/27/2020   HGBA1C 14.0 (H) 09/27/2020    Review of Glycemic Control Results for IMRE, VECCHIONE (MRN [id number]) as of 09/27/2020 11:08  Ref. Range 09/26/2020 19:59 09/26/2020 21:15 09/27/2020 00:01 09/27/2020 00:37 09/27/2020 07:23  Glucose-Capillary Latest Ref Range: 70 - 99 mg/dL 502 (HH) Novolog 20 units 427 (H) 72 87 341 (H) Novolog 11 units   Diabetes history: DM2 Outpatient Diabetes medications: Lantus 60 units q hs + Novolog 10 units tid meal coverage (sometimes takes) + Metformin 500 mg Current orders for Inpatient glycemic control: Novolog 0-15 units correction tid + hs 0-5 units  Inpatient Diabetes Program Recommendations:   -Add Lantus 30 units daily start as soon as possible (50% home insulin dose) -Novolog 8 units tid meal coverage if eats 50% Secure chat sent to Dr. Dyann Kief.  Spoke with patient via phone (DM coordinator @ [facility]). Patient states he takes his meal coverage "sometimes".  Reviewed with patient need for basal + meal coverage along with Metformin. A1c 15.5 on 06/29/20 and currently >14.  Thank you, Nani Gasser. Branda Chaudhary, RN, MSN, CDE  Diabetes Coordinator Inpatient Glycemic Control Team Team Pager [phone] (8am-5pm) 09/27/2020 11:14 AM

## 2020-09-27 NOTE — Progress Notes (Signed)
Notified Ardith Dark, NP of pt refusing levemir and novolog.

## 2020-09-28 ENCOUNTER — Other Ambulatory Visit: Payer: Self-pay

## 2020-09-28 ENCOUNTER — Encounter (HOSPITAL_COMMUNITY): Payer: Self-pay | Admitting: Emergency Medicine

## 2020-09-28 ENCOUNTER — Inpatient Hospital Stay (HOSPITAL_COMMUNITY)
Admission: EM | Admit: 2020-09-28 | Discharge: 2020-09-29 | DRG: 291 | Discharge status: Against Medical Advice | Payer: 59 | Attending: Family Medicine | Admitting: Family Medicine

## 2020-09-28 ENCOUNTER — Emergency Department (HOSPITAL_COMMUNITY): Payer: 59

## 2020-09-28 DIAGNOSIS — Z7901 Long term (current) use of anticoagulants: Secondary | ICD-10-CM | POA: Diagnosis not present

## 2020-09-28 DIAGNOSIS — J449 Chronic obstructive pulmonary disease, unspecified: Secondary | ICD-10-CM | POA: Diagnosis present

## 2020-09-28 DIAGNOSIS — N183 Chronic kidney disease, stage 3 unspecified: Secondary | ICD-10-CM | POA: Diagnosis present

## 2020-09-28 DIAGNOSIS — I5043 Acute on chronic combined systolic (congestive) and diastolic (congestive) heart failure: Secondary | ICD-10-CM | POA: Diagnosis present

## 2020-09-28 DIAGNOSIS — D75839 Thrombocytosis, unspecified: Secondary | ICD-10-CM | POA: Diagnosis present

## 2020-09-28 DIAGNOSIS — N5089 Other specified disorders of the male genital organs: Secondary | ICD-10-CM

## 2020-09-28 DIAGNOSIS — Z823 Family history of stroke: Secondary | ICD-10-CM

## 2020-09-28 DIAGNOSIS — E1122 Type 2 diabetes mellitus with diabetic chronic kidney disease: Secondary | ICD-10-CM | POA: Diagnosis present

## 2020-09-28 DIAGNOSIS — Z7982 Long term (current) use of aspirin: Secondary | ICD-10-CM

## 2020-09-28 DIAGNOSIS — Z85820 Personal history of malignant melanoma of skin: Secondary | ICD-10-CM | POA: Diagnosis not present

## 2020-09-28 DIAGNOSIS — K219 Gastro-esophageal reflux disease without esophagitis: Secondary | ICD-10-CM | POA: Diagnosis present

## 2020-09-28 DIAGNOSIS — Z20822 Contact with and (suspected) exposure to covid-19: Secondary | ICD-10-CM | POA: Diagnosis present

## 2020-09-28 DIAGNOSIS — N50819 Testicular pain, unspecified: Secondary | ICD-10-CM | POA: Diagnosis not present

## 2020-09-28 DIAGNOSIS — E11319 Type 2 diabetes mellitus with unspecified diabetic retinopathy without macular edema: Secondary | ICD-10-CM | POA: Diagnosis present

## 2020-09-28 DIAGNOSIS — E1143 Type 2 diabetes mellitus with diabetic autonomic (poly)neuropathy: Secondary | ICD-10-CM | POA: Diagnosis present

## 2020-09-28 DIAGNOSIS — R109 Unspecified abdominal pain: Secondary | ICD-10-CM

## 2020-09-28 DIAGNOSIS — I252 Old myocardial infarction: Secondary | ICD-10-CM | POA: Diagnosis not present

## 2020-09-28 DIAGNOSIS — F17210 Nicotine dependence, cigarettes, uncomplicated: Secondary | ICD-10-CM | POA: Diagnosis present

## 2020-09-28 DIAGNOSIS — E782 Mixed hyperlipidemia: Secondary | ICD-10-CM | POA: Diagnosis present

## 2020-09-28 DIAGNOSIS — R739 Hyperglycemia, unspecified: Secondary | ICD-10-CM

## 2020-09-28 DIAGNOSIS — Z811 Family history of alcohol abuse and dependence: Secondary | ICD-10-CM | POA: Diagnosis not present

## 2020-09-28 DIAGNOSIS — I509 Heart failure, unspecified: Secondary | ICD-10-CM

## 2020-09-28 DIAGNOSIS — R600 Localized edema: Secondary | ICD-10-CM

## 2020-09-28 DIAGNOSIS — I2510 Atherosclerotic heart disease of native coronary artery without angina pectoris: Secondary | ICD-10-CM | POA: Diagnosis present

## 2020-09-28 DIAGNOSIS — I10 Essential (primary) hypertension: Secondary | ICD-10-CM

## 2020-09-28 DIAGNOSIS — I130 Hypertensive heart and chronic kidney disease with heart failure and stage 1 through stage 4 chronic kidney disease, or unspecified chronic kidney disease: Secondary | ICD-10-CM | POA: Diagnosis present

## 2020-09-28 DIAGNOSIS — E871 Hypo-osmolality and hyponatremia: Secondary | ICD-10-CM | POA: Diagnosis present

## 2020-09-28 DIAGNOSIS — Z5329 Procedure and treatment not carried out because of patient's decision for other reasons: Secondary | ICD-10-CM | POA: Diagnosis present

## 2020-09-28 DIAGNOSIS — Z955 Presence of coronary angioplasty implant and graft: Secondary | ICD-10-CM

## 2020-09-28 DIAGNOSIS — E1165 Type 2 diabetes mellitus with hyperglycemia: Secondary | ICD-10-CM | POA: Diagnosis present

## 2020-09-28 DIAGNOSIS — R609 Edema, unspecified: Secondary | ICD-10-CM | POA: Diagnosis present

## 2020-09-28 DIAGNOSIS — Z7951 Long term (current) use of inhaled steroids: Secondary | ICD-10-CM | POA: Diagnosis not present

## 2020-09-28 DIAGNOSIS — Z79899 Other long term (current) drug therapy: Secondary | ICD-10-CM

## 2020-09-28 LAB — CBC WITH DIFFERENTIAL/PLATELET
Abs Immature Granulocytes: 0.03 10*3/uL (ref 0.00–0.07)
Basophils Absolute: 0.1 10*3/uL (ref 0.0–0.1)
Basophils Relative: 1 %
Eosinophils Absolute: 0 10*3/uL (ref 0.0–0.5)
Eosinophils Relative: 0 %
HCT: 42.5 % (ref 39.0–52.0)
Hemoglobin: 13.9 g/dL (ref 13.0–17.0)
Immature Granulocytes: 0 %
Lymphocytes Relative: 11 %
Lymphs Abs: 1.1 10*3/uL (ref 0.7–4.0)
MCH: 30.8 pg (ref 26.0–34.0)
MCHC: 32.7 g/dL (ref 30.0–36.0)
MCV: 94 fL (ref 80.0–100.0)
Monocytes Absolute: 1.2 10*3/uL — ABNORMAL HIGH (ref 0.1–1.0)
Monocytes Relative: 13 %
Neutro Abs: 7.2 10*3/uL (ref 1.7–7.7)
Neutrophils Relative %: 75 %
Platelets: 449 10*3/uL — ABNORMAL HIGH (ref 150–400)
RBC: 4.52 MIL/uL (ref 4.22–5.81)
RDW: 14.8 % (ref 11.5–15.5)
WBC: 9.7 10*3/uL (ref 4.0–10.5)
nRBC: 0 % (ref 0.0–0.2)

## 2020-09-28 LAB — COMPREHENSIVE METABOLIC PANEL
ALT: 44 U/L (ref 0–44)
AST: 47 U/L — ABNORMAL HIGH (ref 15–41)
Albumin: 2.8 g/dL — ABNORMAL LOW (ref 3.5–5.0)
Alkaline Phosphatase: 148 U/L — ABNORMAL HIGH (ref 38–126)
Anion gap: 11 (ref 5–15)
BUN: 31 mg/dL — ABNORMAL HIGH (ref 6–20)
CO2: 27 mmol/L (ref 22–32)
Calcium: 8.3 mg/dL — ABNORMAL LOW (ref 8.9–10.3)
Chloride: 86 mmol/L — ABNORMAL LOW (ref 98–111)
Creatinine, Ser: 1.58 mg/dL — ABNORMAL HIGH (ref 0.61–1.24)
GFR, Estimated: 51 mL/min — ABNORMAL LOW (ref >60)
Glucose, Bld: 584 mg/dL (ref 70–99)
Potassium: 4.1 mmol/L (ref 3.5–5.1)
Sodium: 124 mmol/L — ABNORMAL LOW (ref 135–145)
Total Bilirubin: 0.7 mg/dL (ref 0.3–1.2)
Total Protein: 6.7 g/dL (ref 6.5–8.1)

## 2020-09-28 LAB — CBG MONITORING, ED: Glucose-Capillary: 441 mg/dL — ABNORMAL HIGH (ref 70–99)

## 2020-09-28 LAB — RESP PANEL BY RT-PCR (FLU A&B, COVID) ARPGX2
Influenza A by PCR: NEGATIVE
Influenza B by PCR: NEGATIVE
SARS Coronavirus 2 by RT PCR: NEGATIVE

## 2020-09-28 LAB — BRAIN NATRIURETIC PEPTIDE: B Natriuretic Peptide: 2169 pg/mL — ABNORMAL HIGH (ref 0.0–100.0)

## 2020-09-28 MED ORDER — FUROSEMIDE 10 MG/ML IJ SOLN
40.0000 mg | Freq: Once | INTRAMUSCULAR | Status: AC
  Administered 2020-09-28: 21:00:00 40 mg via INTRAVENOUS
  Filled 2020-09-28: qty 4

## 2020-09-28 MED ORDER — INSULIN ASPART 100 UNIT/ML ~~LOC~~ SOLN
15.0000 [IU] | Freq: Once | SUBCUTANEOUS | Status: AC
  Administered 2020-09-28: 22:00:00 15 [IU] via SUBCUTANEOUS
  Filled 2020-09-28: qty 1

## 2020-09-28 MED ORDER — HYDROMORPHONE HCL 1 MG/ML IJ SOLN
1.0000 mg | Freq: Once | INTRAMUSCULAR | Status: AC
  Administered 2020-09-28: 22:00:00 1 mg via INTRAVENOUS
  Filled 2020-09-28: qty 1

## 2020-09-28 NOTE — ED Notes (Signed)
Pt recently admitted and left AMA.

## 2020-09-28 NOTE — ED Provider Notes (Signed)
[facility] EMERGENCY DEPARTMENT Provider Note   CSN: [id number] Arrival date & time: 09/28/20  1937     History Chief Complaint  Patient presents with   Testicle Pain    Dustin Cruz is a 54 y.o. male.  The history is provided by the patient. No language interpreter was used.  Testicle Pain This is a recurrent problem. The current episode started more than 1 week ago. The problem occurs constantly. The problem has been gradually worsening. Associated symptoms include shortness of breath. Nothing aggravates the symptoms. Nothing relieves the symptoms. He has tried nothing for the symptoms. The treatment provided moderate relief.   Pt complains of severe swelling in scrotum and penis.  Pt reports he is taking his medications but has had very little urine output.  Pt was seen here yesterday.  Pt decided he did not want to be admitted and left after hospitalist saw him.  Pt is now willing to stay.      Past Medical History:  Diagnosis Date   Anxiety    Asthma    CKD stage 3 due to type 2 diabetes mellitus ([place])    COPD (chronic obstructive pulmonary disease) ([place])    Coronary artery disease    s/p BMS to [place] 10/2010;  Cath 01/22/12 prox 30-40% LAD, LCx ramus w/ hazy 70-80% in-stent restenosis, EF 60% treated medically   Coronary artery disease    s/p BMS to [place] 10/2010;  Cath 01/22/12 prox 30-40% LAD, LCx ramus w/ hazy 70-80% in-stent restenosis, EF 60% treated medically    Depression    Diabetes mellitus    Type 2   Gastroparesis 05/2013   GERD (gastroesophageal reflux disease)    HTN (hypertension)    Hyperlipidemia    Hyperlipidemia    Major depression, chronic 08/13/2012   Melanoma ([place]) 2007   surgery at [facility], Followed by [facility]   Myocardial infarction [facility]) 2012   Obesity    Tobacco abuse    Tubular adenoma    Urinary retention     Patient Active Problem List   Diagnosis Date Noted   Peripheral edema 09/27/2020   Acute on chronic  systolic (congestive) heart failure ([place]) 09/27/2020   Scrotum swelling 62/95/2841   Acute diastolic CHF (congestive heart failure) ([place]) 07/23/2020   Abnormal weight gain 07/23/2020   Noncompliance 07/23/2020   Pelvic lymphadenopathy 07/20/2020   Acute on chronic diastolic CHF (congestive heart failure) ([place]) 06/30/2020   CHF (congestive heart failure) ([place]) 06/29/2020   Pre-ulcerative calluses 10/29/2019   Musculoskeletal pain 10/13/2019   Housing problems 05/23/2019   Essential hypertension, benign 03/20/2019   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   Near syncope 03/17/2019   AKI (acute kidney injury) ([place]) 03/17/2019   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   CKD stage 3 due to type 2 diabetes mellitus ([place]) 11/28/2018   Microalbuminuria due to type 2 diabetes mellitus ([place]) 05/22/2018   Hyperglycemia due to diabetes mellitus ([place]) 04/24/2018   Abdominal pain, epigastric 11/29/2017   Diarrhea 11/29/2017   History of colonic polyps 11/29/2017   Diffuse abdominal pain 11/29/2017   Diabetic retinopathy ([place]) 09/21/2015   Carotid bruit 09/21/2015   Viral gastroenteritis 06/03/2015   Hypertriglyceridemia 05/18/2015   Vitamin D deficiency 12/29/2013   Nausea alone 04/24/2013   Frequency of urination 04/14/2013   Fever, unspecified 04/14/2013   Hepatic steatosis 12/28/2012   Peripheral neuropathy 11/25/2012   Melanoma of skin, site unspecified 11/25/2012   Mixed hyperlipidemia    Depression    Hypertension associated with  diabetes ([place])    GERD (gastroesophageal reflux disease) 04/03/2012   Current smoker 03/14/2012   Type 2 diabetes mellitus with stage 3a chronic kidney disease, with long-term current use of insulin (HCC)    CAD (coronary artery disease) 01/06/2011    Past Surgical History:  Procedure Laterality Date   BIOPSY  01/07/2018   Procedure: BIOPSY;  Surgeon: Daneil Dolin, MD;  Location: [facility];  Service: Endoscopy;;  duodenal and  gastric biopsy   BIOPSY  04/25/2018   Procedure: BIOPSY;  Surgeon: Daneil Dolin, MD;  Location: [facility];  Service: Endoscopy;;  ascending and descending and sigmoid biopsies   CARDIAC CATHETERIZATION     with stent   COLONOSCOPY N/A 01/27/2013   LDJ:TTSVXB and colonic polyps. Tubular adenomas, poor bowel prep, one-year follow-up surveillance colonoscopy recommended   COLONOSCOPY WITH PROPOFOL N/A 04/25/2018   Procedure: COLONOSCOPY WITH PROPOFOL;  Surgeon: Daneil Dolin, MD;  Location: [facility];  Service: Endoscopy;  Laterality: N/A;  11:00am - pt to be prepped INPT and labs to be done as well   CORONARY ANGIOPLASTY WITH STENT PLACEMENT     ESOPHAGOGASTRODUODENOSCOPY  04/24/2012   [place]-mild erosive reflux esophagitis,dilated w/71F Venia Minks, small HH, minimal chronic gastric/bulbar erosions(No H pylori)   ESOPHAGOGASTRODUODENOSCOPY (EGD) WITH PROPOFOL N/A 01/07/2018   Dr. Gala Romney: Erythematous mucosa in the stomach, retained gastric contents, incomplete exam.  Biopsied showed mild chronic gastritis but no H. pylori.  Duodenal biopsy was negative for celiac disease.   FLEXIBLE SIGMOIDOSCOPY N/A 01/07/2018   Dr. Gala Romney: Incomplete colonoscopy due to inadequate bowel prep   LEFT HEART CATHETERIZATION WITH CORONARY ANGIOGRAM N/A 01/22/2012   Procedure: LEFT HEART CATHETERIZATION WITH CORONARY ANGIOGRAM;  Surgeon: Jolaine Artist, MD;  Location: [facility] CATH LAB;  Service: Cardiovascular;  Laterality: N/A;   melanoma surgery  2007   [place], removed lymph nodes under arm as well,Left abd       Family History  Problem Relation Age of Onset   Stroke Mother    Alcohol abuse Father    Heart disease Other    Other Other        not real familiar with family history   Lung cancer Other    Colon cancer Neg Hx    Colon polyps Neg Hx     Social History   Tobacco Use   Smoking status: Current Every Day Smoker    Packs/day: 1.00    Years: 31.00    [place] years: 31.00    Types:  Cigarettes    Start date: 10/09/1981   Smokeless tobacco: Never Used   Tobacco comment: 1/2 [place] daily 11/01/17  Vaping Use   Vaping Use: Never used  Substance Use Topics   Alcohol use: No    Alcohol/week: 0.0 standard drinks    Comment: 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   Drug use: No    Home Medications Prior to Admission medications   Medication Sig Start Date End Date Taking? Authorizing Provider  acetaminophen (TYLENOL) 325 MG tablet Take 2 tablets (650 mg total) by mouth every 6 (six) hours as needed for mild pain, moderate pain or fever (or Fever >/= 101). 03/21/19   Roxan Hockey, MD  albuterol (PROVENTIL) (2.5 MG/3ML) 0.083% nebulizer solution Take 3 mLs (2.5 mg total) by nebulization every 6 (six) hours as needed for wheezing or shortness of breath. Dx---44.1 02/16/20   [name], DO  albuterol (  VENTOLIN HFA) 108 (90 Base) MCG/ACT inhaler 2 puffs every 6 hours prn 07/01/20   Ronnie Doss M, DO  aspirin EC 81 MG tablet Take 1 tablet (81 mg total) by mouth daily with breakfast. 03/21/19   Roxan Hockey, MD  atorvastatin (LIPITOR) 80 MG tablet Take 1 tablet (80 mg total) by mouth daily. 07/01/20   [name], DO  Blood Glucose Monitoring Suppl (ACCU-CHEK GUIDE) w/Device KIT 1 Piece by Does not apply route as directed. 06/30/20   Claretta Fraise, MD  busPIRone (BUSPAR) 7.5 MG tablet Take 1 tablet by mouth twice daily 09/06/20   Ronnie Doss M, DO  ELIQUIS 5 MG TABS tablet Take 1 tablet by mouth twice daily 09/22/20   Strader, [place] M, PA-C  ENTRESTO 24-26 MG Take 1 tablet by mouth 2 (two) times daily. 08/01/20   [provider]  famotidine (PEPCID) 20 MG tablet Take 20 mg by mouth 2 (two) times daily. 12/22/19   [provider]  furosemide (LASIX) 80 MG tablet Take 1 tablet (80 mg total) by mouth 2 (two) times daily. 81/2/75   Delora Fuel, MD  gabapentin (NEURONTIN)  600 MG tablet Take 1 tablet (600 mg total) by mouth 3 (three) times daily. 07/01/20   [name], DO  glucose blood (ACCU-CHEK GUIDE) test strip Use as instructed 07/01/20   Ronnie Doss M, DO  insulin glargine (LANTUS SOLOSTAR) 100 UNIT/ML Solostar Pen Inject 60 Units into the skin every evening. 07/01/20   Ronnie Doss M, DO  lisinopril (ZESTRIL) 10 MG tablet Take 1 tablet (10 mg total) by mouth at bedtime. 07/01/20   [name], DO  metFORMIN (GLUCOPHAGE) 500 MG tablet Take 1 tablet (500 mg total) by mouth 2 (two) times daily with a meal. Patient not taking: No sig reported 11/12/19   Cassandria Anger, MD  metoprolol succinate (TOPROL-XL) 25 MG 24 hr tablet Take 12.5 mg by mouth daily. 07/30/20   [provider]  mometasone-formoterol (DULERA) 200-5 MCG/ACT AERO Inhale 2 puffs into the lungs in the morning and at bedtime. 07/20/20   [name], DO  nitroGLYCERIN (NITROSTAT) 0.4 MG SL tablet Place 1 tablet (0.4 mg total) under the tongue every 5 (five) minutes as needed. For chest pain. 08/13/12   Reece Packer, NP  oxyCODONE (OXY IR/ROXICODONE) 5 MG immediate release tablet Take 5 mg by mouth 2 (two) times daily as needed. 09/26/20   [provider]  potassium chloride (KLOR-CON) 10 MEQ tablet Take 10 mEq by mouth 2 (two) times daily. 09/16/20   [provider]  tiZANidine (ZANAFLEX) 4 MG tablet Take 4-8 mg by mouth every 6 (six) hours as needed for muscle spasms.    [provider]  traZODone (DESYREL) 150 MG tablet TAKE 1 TABLET BY MOUTH ONCE DAILY AT BEDTIME 08/31/20   Ronnie Doss M, DO  metoprolol tartrate (LOPRESSOR) 25 MG tablet Take 25 mg by mouth 2 (two) times daily.    12/27/11  [provider]    Allergies    Patient has no known allergies.  Review of Systems   Review of Systems  Respiratory: Positive for shortness of breath.   Genitourinary: Positive for testicular pain.  All other systems  reviewed and are negative.   Physical Exam Updated Vital Signs BP (!) 144/103    Pulse 82    Temp 97.6 F (36.4 C) (Oral)    Resp (!) 26    Ht _0  (1.88 m)    Wt  97.1 kg    SpO2 (!) 79% Comment: Pt placed on O2 at 2L via [place]   BMI 27.48 kg/m   Physical Exam Vitals and nursing note reviewed.  Constitutional:      Appearance: He is well-developed and well-nourished.  HENT:     Head: Normocephalic and atraumatic.  Eyes:     Conjunctiva/sclera: Conjunctivae normal.  Cardiovascular:     Rate and Rhythm: Normal rate and regular rhythm.     Heart sounds: No murmur heard.   Pulmonary:     Effort: Pulmonary effort is normal. No respiratory distress.     Breath sounds: Normal breath sounds.  Abdominal:     Palpations: Abdomen is soft.     Tenderness: There is no abdominal tenderness.  Genitourinary:    Comments: Penis swollen. scrotum tight and swollen   Musculoskeletal:        General: No edema.     Cervical back: Neck supple.  Skin:    General: Skin is warm and dry.  Neurological:     Mental Status: He is alert.  Psychiatric:        Mood and Affect: Mood and affect normal.     ED Results / Procedures / Treatments   Labs (all labs ordered are listed, but only abnormal results are displayed) Labs Reviewed  CBC WITH DIFFERENTIAL/PLATELET - Abnormal; Notable for the following components:      Result Value   Platelets 449 (*)    Monocytes Absolute 1.2 (*)    All other components within normal limits  COMPREHENSIVE METABOLIC PANEL - Abnormal; Notable for the following components:   Sodium 124 (*)    Chloride 86 (*)    Glucose, Bld 584 (*)    BUN 31 (*)    Creatinine, Ser 1.58 (*)    Calcium 8.3 (*)    Albumin 2.8 (*)    AST 47 (*)    Alkaline Phosphatase 148 (*)    GFR, Estimated 51 (*)    All other components within normal limits  BRAIN NATRIURETIC PEPTIDE - Abnormal; Notable for the following components:   B Natriuretic Peptide 2,169.0 (*)    All other components  within normal limits    EKG None  Radiology CT ABDOMEN PELVIS W CONTRAST  Result Date: 09/27/2020 CLINICAL DATA:  Swelling of the scrotum and penis, seen 1 week prior for similar symptoms. EXAM: CT ABDOMEN AND PELVIS WITH CONTRAST TECHNIQUE: Multidetector CT imaging of the abdomen and pelvis was performed using the standard protocol following bolus administration of intravenous contrast. CONTRAST:  122m OMNIPAQUE IOHEXOL 300 MG/ML  SOLN COMPARISON:  CT 05/30/2020, scrotal ultrasound 11/24/2016 (outside images, no report) FINDINGS: Lower chest: Small left and trace right pleural effusion. Small pericardial effusion. Cardiac size is top normal. Few coronary artery calcifications are present. Hepatobiliary: Heterogeneous attenuation of the liver without focal discernible liver lesion. Smooth liver surface contour. Physiologic distension of the gallbladder. Small gallstone layering dependently within the gallbladder. No frank gallbladder wall thickening or pericholecystic inflammation. No biliary ductal dilatation or intraductal gallstones. Pancreas: Partial fatty replacement of the pancreas. No pancreatic ductal dilatation or surrounding inflammatory changes. Spleen: Normal in size. No concerning splenic lesions. Adrenals/Urinary Tract: Normal adrenal glands. Kidneys are normally located with symmetric enhancement and excretion. 1.4 cm fluid attenuation cyst seen in the interpolar left kidney. No suspicious renal lesion, urolithiasis or hydronephrosis. Circumferential bladder wall thickening, greater than expected for underdistention. Stomach/Bowel: Distal esophagus, stomach and duodenum are unremarkable. High attenuation enteric contrast material  partially traverses the small bowel. No small bowel thickening or dilatation. No evidence of obstruction. Normal appendix in the right lower quadrant focal periappendiceal inflammation. No colonic dilatation or wall thickening. Vascular/Lymphatic: Atherosclerotic  calcifications within the abdominal aorta and branch vessels. No aneurysm or ectasia. Enlarged centrally hypoattenuating nodes are present in the bilateral inguinal change, largest on the left measuring up to 19 mm short axis, largest on the right measuring up to 16 mm (2/90, 86). No other enlarged nodes seen in the abdomen or pelvis. Reproductive: The prostate and seminal vesicles are unremarkable. Extensive edematous changes about the mons pubis and included portions of the external genitalia encompassing only the base of the penis on this exam. No soft tissue gas, organized collection or abscess is seen. Other: Extensive severe circumferential body wall edema most pronounced towards the lower abdomen, pelvis and inguinal soft tissues and throughout the mons pubis. No organized collection or abscess is seen. No soft tissue gas. Musculoskeletal: No acute osseous abnormality or suspicious osseous lesion. IMPRESSION: 1. Extensive edematous changes about the mons pubis and included portions of the external genitalia encompassing only the base of the penis on this exam. This could reflect normal distribution of edematous changes given additional features of anasarca with extreme edematous changes of the body wall. However, should correlate with exam findings to exclude a cellulitis or soft tissue infection. 2. Enlarged centrally hypoattenuating nodes in the bilateral inguinal change, largest on the left measuring up to 19 mm short axis, largest on the right measuring up to 16 mm short axis. Findings are favored to be edematous or reactive though could consider clinical surveillance and reimaging if these persist post resolution of the acute symptoms. 3. Circumferential bladder wall thickening, greater than expected for underdistention. Recommend correlation with urinalysis to exclude cystitis. 4. Small left and trace right pleural effusion. Small pericardial effusion. 5. Heterogeneous attenuation of the liver without  focal discernible liver lesion. Finding is nonspecific but could be seen in the setting of hepatic congestion in the setting of heart failure or intrinsic liver disease. Correlate with LFTs. 6. Cholelithiasis without evidence of acute cholecystitis. 7. Aortic Atherosclerosis (ICD10-I70.0). Electronically Signed   By: Lovena Le M.D.   On: 09/27/2020 04:08   DG Chest Port 1 View  Result Date: 09/28/2020 CLINICAL DATA:  Short of breath, lower extremity edema, tobacco abuse EXAM: PORTABLE CHEST 1 VIEW COMPARISON:  09/26/2020 FINDINGS: Single frontal view of the chest demonstrates a stable cardiac silhouette. Chronic scarring without airspace disease, effusion, or pneumothorax. No acute bony abnormality. IMPRESSION: 1. No acute intrathoracic process. Electronically Signed   By: Randa Ngo M.D.   On: 09/28/2020 21:13    Procedures Procedures (including critical care time)  Medications Ordered in ED Medications  HYDROmorphone (DILAUDID) injection 1 mg (has no administration in time range)  insulin aspart (novoLOG) injection 15 Units (has no administration in time range)  furosemide (LASIX) injection 40 mg (40 mg Intravenous Given 09/28/20 2058)    ED Course  I have reviewed the triage vital signs and the nursing notes.  Pertinent labs & imaging results that were available during my care of the patient were reviewed by me and considered in my medical decision making (see chart for details).    MDM Rules/Calculators/A&P                          MDM:  Pt given insulin, lasix and dilaudid for pain.  BNP is 2169, glucose  is 584.   Dr. Gilford Raid in to see. Hospitalist consulted for admission  Final Clinical Impression(s) / ED Diagnoses Final diagnoses:  Scrotal edema  Congestive heart failure, unspecified HF chronicity, unspecified heart failure type ([place])  Hyperglycemia    Rx / DC Orders ED Discharge Orders    None       [name] 09/28/20 2251    Isla Pence,  MD 09/30/20 [phone]

## 2020-09-28 NOTE — ED Notes (Signed)
Date and time results received: 09/28/20 2120 (use smartphrase ".now" to insert current time)  Test: glucose Critical Value: 584  Name of Provider Notified: [place]  Orders Received? Or Actions Taken?: no

## 2020-09-28 NOTE — ED Triage Notes (Signed)
Pt c/o scrotum swelling, he states he cannot walk and he has fallen today.

## 2020-09-28 NOTE — Discharge Summary (Signed)
Physician Discharge Summary  Dustin Cruz [id number] DOB: 05/15/1966 DOA: 09/26/2020  PCP: [name], DO  Admit date: 09/26/2020 Discharge date: 09/28/2020  Time spent: 25 minutes  Recommendations for Outpatient Follow-up:  1. Patient left AGAINST MEDICAL ADVICE.   Discharge Diagnoses:  Principal Problem:   Scrotum swelling Active Problems:   CAD (coronary artery disease)   Hyperglycemia due to diabetes mellitus (HCC)   CHF (congestive heart failure) (HCC)   Noncompliance   Peripheral edema   Acute on chronic systolic (congestive) heart failure [facility])   Discharge Condition: Patient left AGAINST MEDICAL ADVICE.  Still in not optimal conditions for discharge.  CODE STATUS: Full code.  Diet recommendation: Low-fat carbohydrate diet and low-sodium diet was discussed with patient; he left AGAINST MEDICAL ADVICE.  Filed Weights   09/26/20 1850  Weight: 97.1 kg    History of present illness:  As per H&P written by Dr. Josephine Cables on 09/26/2020 Dustin Cruz a 54 y.o.malewith medical history significant forCAD s/p stent placement, COPD,Type 2 Diabetes Mellitus, history of noncompliance with medication who presents to the emergency department due to complaint of swelling of his scrotum and penis which has been going on for several days. Patient was seen for same complaint in the ED 1 week ago (12/12) as well as for elevated blood glucose levels that was treated with IV insulin. Patient returned to the ED on 12/17 for peripheral edema and chronic rash on legs. Patient states that he has been compliant with his home Lasix and diet. He denies chest pain, shortness of breath or any discomfort in urinating despite the scrotal and penile swelling.  ED Course: In the emergency department, he washemodynamically stable. Work-up in the ED showed thrombocytosis, hyperglycemia, ALT 46, ALP 185, albumin 3.1.  Chest x-ray showed chronic central vascular congestion without  evidence of edema. 20 units of insulin subcu was given. Hospitalist was asked to admit patient for further evaluation and management.  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 -Still complaining of significant distention, pain and fluid buildup in his legs, scrotum and abd girth. -Patient educated and instructed on the importance of medication compliance and low-sodium diet. -Close monitoring of daily weight and strict I's and O's were checked. -Active IV diuresis was provided -Patient was kept on Entresto and Toprol. -Patient left AMA  2-type 2 diabetes chronically on insulin with hyperglycemia and poor control -Patient was started on Lantus scale insulin and adjusted dose of Levemir along with meal coverage. -He ended declining insulin therapy while inpatient. -Modified carbohydrate diet was ordered and emphasized.  3-hyperlipidemia -Statin was continued.  4-history of CAD (coronary artery disease) -No chest pain reported during hospitalization -Patient was kept on telemetry monitoring. -Statins, beta-blocker and aspirin were continued.  5-GERD -Pepcid was continued.  6-hx of COPD -No wheezing currently. -Patient was kept on [facility] -The plan was to continue As needed bronchodilators  Procedures: See below for x-ray report  Consultations:  None  Discharge Exam: Vitals:   09/27/20 1938 09/27/20 2006  BP:  123/73  Pulse:  90  Resp:  19  Temp:  98 F (36.7 C)  SpO2: 97% 92%    General: Patient left the hospital in the middle of the night [place]; he was a still significantly fluid overload and having ongoing symptoms from acute on chronic combined heart failure.   Discharge Instructions: Patient left AMA.  No Known Allergies    The results  of significant diagnostics from this hospitalization (including imaging, microbiology, ancillary and laboratory)  are listed below for reference.    Significant Diagnostic Studies: CT ABDOMEN PELVIS W CONTRAST  Result Date: 09/27/2020 CLINICAL DATA:  Swelling of the scrotum and penis, seen 1 week prior for similar symptoms. EXAM: CT ABDOMEN AND PELVIS WITH CONTRAST TECHNIQUE: Multidetector CT imaging of the abdomen and pelvis was performed using the standard protocol following bolus administration of intravenous contrast. CONTRAST:  157mL OMNIPAQUE IOHEXOL 300 MG/ML  SOLN COMPARISON:  CT 05/30/2020, scrotal ultrasound 11/24/2016 (outside images, no report) FINDINGS: Lower chest: Small left and trace right pleural effusion. Small pericardial effusion. Cardiac size is top normal. Few coronary artery calcifications are present. Hepatobiliary: Heterogeneous attenuation of the liver without focal discernible liver lesion. Smooth liver surface contour. Physiologic distension of the gallbladder. Small gallstone layering dependently within the gallbladder. No frank gallbladder wall thickening or pericholecystic inflammation. No biliary ductal dilatation or intraductal gallstones. Pancreas: Partial fatty replacement of the pancreas. No pancreatic ductal dilatation or surrounding inflammatory changes. Spleen: Normal in size. No concerning splenic lesions. Adrenals/Urinary Tract: Normal adrenal glands. Kidneys are normally located with symmetric enhancement and excretion. 1.4 cm fluid attenuation cyst seen in the interpolar left kidney. No suspicious renal lesion, urolithiasis or hydronephrosis. Circumferential bladder wall thickening, greater than expected for underdistention. Stomach/Bowel: Distal esophagus, stomach and duodenum are unremarkable. High attenuation enteric contrast material partially traverses the small bowel. No small bowel thickening or dilatation. No evidence of obstruction. Normal appendix in the right lower quadrant focal periappendiceal inflammation. No colonic dilatation or wall thickening.  Vascular/Lymphatic: Atherosclerotic calcifications within the abdominal aorta and branch vessels. No aneurysm or ectasia. Enlarged centrally hypoattenuating nodes are present in the bilateral inguinal change, largest on the left measuring up to 19 mm short axis, largest on the right measuring up to 16 mm (2/90, 86). No other enlarged nodes seen in the abdomen or pelvis. Reproductive: The prostate and seminal vesicles are unremarkable. Extensive edematous changes about the mons pubis and included portions of the external genitalia encompassing only the base of the penis on this exam. No soft tissue gas, organized collection or abscess is seen. Other: Extensive severe circumferential body wall edema most pronounced towards the lower abdomen, pelvis and inguinal soft tissues and throughout the mons pubis. No organized collection or abscess is seen. No soft tissue gas. Musculoskeletal: No acute osseous abnormality or suspicious osseous lesion. IMPRESSION: 1. Extensive edematous changes about the mons pubis and included portions of the external genitalia encompassing only the base of the penis on this exam. This could reflect normal distribution of edematous changes given additional features of anasarca with extreme edematous changes of the body wall. However, should correlate with exam findings to exclude a cellulitis or soft tissue infection. 2. Enlarged centrally hypoattenuating nodes in the bilateral inguinal change, largest on the left measuring up to 19 mm short axis, largest on the right measuring up to 16 mm short axis. Findings are favored to be edematous or reactive though could consider clinical surveillance and reimaging if these persist post resolution of the acute symptoms. 3. Circumferential bladder wall thickening, greater than expected for underdistention. Recommend correlation with urinalysis to exclude cystitis. 4. Small left and trace right pleural effusion. Small pericardial effusion. 5.  Heterogeneous attenuation of the liver without focal discernible liver lesion. Finding is nonspecific but could be seen in the setting of hepatic congestion in the setting of heart failure or intrinsic liver disease. Correlate with LFTs. 6. Cholelithiasis without  evidence of acute cholecystitis. 7. Aortic Atherosclerosis (ICD10-I70.0). Electronically Signed   By: Lovena Le M.D.   On: 09/27/2020 04:08   DG Chest Port 1 View  Result Date: 09/26/2020 CLINICAL DATA:  Testicular swelling, COPD, weakness EXAM: PORTABLE CHEST 1 VIEW COMPARISON:  09/15/2020 FINDINGS: Single frontal view of the chest demonstrates a stable cardiac silhouette. No acute airspace disease, effusion, or pneumothorax. Mild central vascular congestion unchanged. No acute bony abnormalities. IMPRESSION: 1. Chronic central vascular congestion without evidence of edema. Electronically Signed   By: Randa Ngo M.D.   On: 09/26/2020 20:14   DG Chest Port 1 View  Result Date: 09/15/2020 CLINICAL DATA:  Peripheral edema EXAM: PORTABLE CHEST 1 VIEW COMPARISON:  September 11, 2020 FINDINGS: The heart size and mediastinal contours are mildly enlarged. There is prominence of the central pulmonary vasculature. Both lungs are otherwise clear. The visualized skeletal structures are unremarkable. IMPRESSION: Mild cardiomegaly and pulmonary vascular congestion Electronically Signed   By: Prudencio Pair M.D.   On: 09/15/2020 19:46   DG Chest Portable 1 View  Result Date: 09/02/2020 CLINICAL DATA:  Shortness of breath., COPD, hypertension, asthma, history of cancer. Current smoker. EXAM: PORTABLE CHEST 1 VIEW COMPARISON:  Chest x-ray 07/23/2020, CT angio chest 05/30/2020. FINDINGS: The heart size and mediastinal contours are within normal limits. Hyperinflation and coarsened interstitial markings consistent with emphysematous changes. No focal consolidation. No pulmonary edema. No pleural effusion. No pneumothorax. No acute osseous abnormality.  IMPRESSION: 1. No active disease. 2.  Emphysema (ICD10-J43.9). 3. Please evaluate patient on age and smoking history to determine if patient meets the inclusion criteria for specialized low-dose lung cancer screening chest CT. Electronically Signed   By: Iven Finn M.D.   On: 09/02/2020 20:47    Microbiology: Recent Results (from the past 240 hour(s))  Resp Panel by RT-PCR (Flu A&B, Covid) Nasopharyngeal Swab     Status: None   Collection Time: 09/26/20 11:10 PM   Specimen: Nasopharyngeal Swab; Nasopharyngeal(NP) swabs in vial transport medium  Result Value Ref Range Status   SARS Coronavirus 2 by RT PCR NEGATIVE NEGATIVE Final    Comment: (NOTE) SARS-CoV-2 target nucleic acids are NOT DETECTED.  The SARS-CoV-2 RNA is generally detectable in upper respiratory specimens during the acute phase of infection. The lowest concentration of SARS-CoV-2 viral copies this assay can detect is 138 copies/mL. A negative result does not preclude SARS-Cov-2 infection and should not be used as the sole basis for treatment or other patient management decisions. A negative result may occur with  improper specimen collection/handling, submission of specimen other than nasopharyngeal swab, presence of viral mutation(s) within the areas targeted by this assay, and inadequate number of viral copies(<138 copies/mL). A negative result must be combined with clinical observations, patient history, and epidemiological information. The expected result is Negative.  Fact Sheet for Patients:  EntrepreneurPulse.com.au  Fact Sheet for Healthcare Providers:  IncredibleEmployment.be  This test is no t yet approved or cleared by the [place] FDA and  has been authorized for detection and/or diagnosis of SARS-CoV-2 by FDA under an Emergency Use Authorization (EUA). This EUA will remain  in effect (meaning this test can be used) for the duration of the COVID-19 declaration  under Section 564(b)(1) of the Act, 21 U.S.C.section 360bbb-3(b)(1), unless the authorization is terminated  or revoked sooner.       Influenza A by PCR NEGATIVE NEGATIVE Final   Influenza B by PCR NEGATIVE NEGATIVE Final    Comment: (NOTE) The Xpert Xpress  SARS-CoV-2/FLU/RSV plus assay is intended as an aid in the diagnosis of influenza from Nasopharyngeal swab specimens and should not be used as a sole basis for treatment. Nasal washings and aspirates are unacceptable for Xpert Xpress SARS-CoV-2/FLU/RSV testing.  Fact Sheet for Patients: EntrepreneurPulse.com.au  Fact Sheet for Healthcare Providers: IncredibleEmployment.be  This test is not yet approved or cleared by the [place] FDA and has been authorized for detection and/or diagnosis of SARS-CoV-2 by FDA under an Emergency Use Authorization (EUA). This EUA will remain in effect (meaning this test can be used) for the duration of the COVID-19 declaration under Section 564(b)(1) of the Act, 21 U.S.C. section 360bbb-3(b)(1), unless the authorization is terminated or revoked.  Performed at [facility], [address], [place], [place] [address]      Labs: Basic Metabolic Panel: Recent Labs  Lab 09/26/20 2005 09/27/20 0312  NA 126* 128*  K 4.2 3.7  CL 85* 86*  CO2 32 31  GLUCOSE 518* 116*  BUN 16 16  CREATININE 1.31* 1.16  CALCIUM 9.1 8.6*  MG  --  1.9  PHOS  --  3.8   Liver Function Tests: Recent Labs  Lab 09/26/20 2005 09/27/20 0312  AST 35 38  ALT 46* 40  ALKPHOS 185* 157*  BILITOT 0.7 0.7  PROT 7.7 6.7  ALBUMIN 3.1* 2.7*   CBC: Recent Labs  Lab 09/26/20 2005 09/27/20 0312  WBC 8.5 9.3  NEUTROABS 6.2  --   HGB 14.5 13.9  HCT 45.0 42.8  MCV 95.9 95.5  PLT 437* 458*   BNP (last 3 results) Recent Labs    09/15/20 1953 09/19/20 0610 09/26/20 2005  BNP 1,630.0* 1,435.0* 1,979.0*    CBG: Recent Labs  Lab 09/27/20 0723 09/27/20 1132  09/27/20 1620 09/27/20 1857 09/27/20 2008  GLUCAP 341* 189* 319* 251* 220*   Signed:  Barton Dubois MD.  [facility] 09/28/2020, 7:58 AM

## 2020-09-29 ENCOUNTER — Inpatient Hospital Stay (HOSPITAL_COMMUNITY): Payer: 59

## 2020-09-29 LAB — CBG MONITORING, ED
Glucose-Capillary: 144 mg/dL — ABNORMAL HIGH (ref 70–99)
Glucose-Capillary: 256 mg/dL — ABNORMAL HIGH (ref 70–99)
Glucose-Capillary: 246 mg/dL — ABNORMAL HIGH (ref 70–99)

## 2020-09-29 LAB — CBC
HCT: 43.7 % (ref 39.0–52.0)
Hemoglobin: 13.9 g/dL (ref 13.0–17.0)
MCH: 30.3 pg (ref 26.0–34.0)
MCHC: 31.8 g/dL (ref 30.0–36.0)
MCV: 95.4 fL (ref 80.0–100.0)
Platelets: 454 10*3/uL — ABNORMAL HIGH (ref 150–400)
RBC: 4.58 MIL/uL (ref 4.22–5.81)
RDW: 14.7 % (ref 11.5–15.5)
WBC: 8.4 10*3/uL (ref 4.0–10.5)
nRBC: 0 % (ref 0.0–0.2)

## 2020-09-29 LAB — COMPREHENSIVE METABOLIC PANEL
ALT: 44 U/L (ref 0–44)
AST: 46 U/L — ABNORMAL HIGH (ref 15–41)
Albumin: 2.7 g/dL — ABNORMAL LOW (ref 3.5–5.0)
Alkaline Phosphatase: 147 U/L — ABNORMAL HIGH (ref 38–126)
Anion gap: 10 (ref 5–15)
BUN: 29 mg/dL — ABNORMAL HIGH (ref 6–20)
CO2: 31 mmol/L (ref 22–32)
Calcium: 8.8 mg/dL — ABNORMAL LOW (ref 8.9–10.3)
Chloride: 89 mmol/L — ABNORMAL LOW (ref 98–111)
Creatinine, Ser: 1.36 mg/dL — ABNORMAL HIGH (ref 0.61–1.24)
GFR, Estimated: >60 mL/min (ref >60)
Glucose, Bld: 109 mg/dL — ABNORMAL HIGH (ref 70–99)
Potassium: 4.2 mmol/L (ref 3.5–5.1)
Sodium: 130 mmol/L — ABNORMAL LOW (ref 135–145)
Total Bilirubin: 0.7 mg/dL (ref 0.3–1.2)
Total Protein: 6.9 g/dL (ref 6.5–8.1)

## 2020-09-29 LAB — RAPID URINE DRUG SCREEN, HOSP PERFORMED
Amphetamines: NOT DETECTED
Barbiturates: NOT DETECTED
Benzodiazepines: NOT DETECTED
Cocaine: NOT DETECTED
Opiates: POSITIVE — AB
Tetrahydrocannabinol: NOT DETECTED

## 2020-09-29 LAB — PROTIME-INR
INR: 1 (ref 0.8–1.2)
Prothrombin Time: 13.1 seconds (ref 11.4–15.2)

## 2020-09-29 LAB — MAGNESIUM: Magnesium: 2.1 mg/dL (ref 1.7–2.4)

## 2020-09-29 LAB — APTT: aPTT: 31 seconds (ref 24–36)

## 2020-09-29 LAB — PHOSPHORUS: Phosphorus: 4.6 mg/dL (ref 2.5–4.6)

## 2020-09-29 MED ORDER — ASPIRIN EC 81 MG PO TBEC
81.0000 mg | DELAYED_RELEASE_TABLET | Freq: Every day | ORAL | Status: DC
  Administered 2020-09-29: 09:00:00 81 mg via ORAL
  Filled 2020-09-29: qty 1

## 2020-09-29 MED ORDER — INSULIN ASPART 100 UNIT/ML ~~LOC~~ SOLN
0.0000 [IU] | Freq: Three times a day (TID) | SUBCUTANEOUS | Status: DC
  Administered 2020-09-29: 16:00:00 3 [IU] via SUBCUTANEOUS
  Administered 2020-09-29: 11:00:00 5 [IU] via SUBCUTANEOUS
  Administered 2020-09-29: 09:00:00 1 [IU] via SUBCUTANEOUS
  Filled 2020-09-29 (×3): qty 1

## 2020-09-29 MED ORDER — FUROSEMIDE 10 MG/ML IJ SOLN
40.0000 mg | Freq: Two times a day (BID) | INTRAMUSCULAR | Status: DC
  Administered 2020-09-29 (×2): 40 mg via INTRAVENOUS
  Filled 2020-09-29 (×2): qty 4

## 2020-09-29 MED ORDER — ATORVASTATIN CALCIUM 40 MG PO TABS
80.0000 mg | ORAL_TABLET | Freq: Every day | ORAL | Status: DC
  Administered 2020-09-29: 11:00:00 80 mg via ORAL
  Filled 2020-09-29: qty 2

## 2020-09-29 MED ORDER — FAMOTIDINE 20 MG PO TABS
20.0000 mg | ORAL_TABLET | Freq: Two times a day (BID) | ORAL | Status: DC
  Administered 2020-09-29 (×3): 20 mg via ORAL
  Filled 2020-09-29 (×3): qty 1

## 2020-09-29 MED ORDER — MOMETASONE FURO-FORMOTEROL FUM 200-5 MCG/ACT IN AERO
2.0000 | INHALATION_SPRAY | Freq: Two times a day (BID) | RESPIRATORY_TRACT | Status: DC
  Administered 2020-09-29 (×2): 2 via RESPIRATORY_TRACT
  Filled 2020-09-29: qty 8.8

## 2020-09-29 MED ORDER — OXYCODONE HCL 5 MG PO TABS
5.0000 mg | ORAL_TABLET | Freq: Four times a day (QID) | ORAL | Status: DC | PRN
  Administered 2020-09-29 (×2): 5 mg via ORAL
  Filled 2020-09-29 (×2): qty 1

## 2020-09-29 MED ORDER — APIXABAN 5 MG PO TABS
5.0000 mg | ORAL_TABLET | Freq: Two times a day (BID) | ORAL | Status: DC
  Administered 2020-09-29 (×2): 5 mg via ORAL
  Filled 2020-09-29 (×2): qty 1

## 2020-09-29 MED ORDER — INSULIN ASPART 100 UNIT/ML ~~LOC~~ SOLN: 0.0000 [IU] | Freq: Every day | SUBCUTANEOUS | Status: DC

## 2020-09-29 MED ORDER — SACUBITRIL-VALSARTAN 24-26 MG PO TABS
1.0000 | ORAL_TABLET | Freq: Two times a day (BID) | ORAL | Status: DC
  Administered 2020-09-29: 22:00:00 1 via ORAL
  Filled 2020-09-29 (×6): qty 1

## 2020-09-29 MED ORDER — METOPROLOL SUCCINATE ER 25 MG PO TB24
12.5000 mg | ORAL_TABLET | Freq: Every day | ORAL | Status: DC
  Administered 2020-09-29: 11:00:00 12.5 mg via ORAL
  Filled 2020-09-29: qty 1

## 2020-09-29 NOTE — Progress Notes (Signed)
Patient Demographics:    Dustin Cruz, is a 54 y.o. male, DOB - Sep 14, 1966, [id number]  Admit date - 09/28/2020   Admitting Physician Bernadette Hoit, DO  Outpatient Primary MD for the patient is [name], DO  LOS - 1  Chief Complaint  Patient presents with  . Testicle Pain        Subjective:    Dustin Cruz today has no fevers, no emesis,  No chest pain,   -Pacing  around, complains of low abdominal, inguinal scrotal area and discomfort  Assessment  & Plan :    Principal Problem:   Acute on chronic combined systolic and diastolic CHF (congestive heart failure) (HCC) Active Problems:   Essential hypertension, benign   CAD (coronary artery disease)   GERD (gastroesophageal reflux disease)   Mixed hyperlipidemia   Hyperglycemia due to diabetes mellitus (HCC)   Scrotum swelling   Peripheral edema   Brief Summary:-  54 y.o. male with medical history significant for CAD s/p stent placement, COPD,Type 2 Diabetes Mellitus, history of noncompliance with medication who presents to the emergency department due to complaint of worsening swelling of his scrotum and penis which has been going on for several days.  Patient was recently admitted on 09/26/20 and signed out [place]  in the evening 09/27/20-- -readmitted 09/28/2020 with worsening abdominal swelling, lower extremity swelling and scrotal and penile swelling  A/p 1)-Acute on chronic diastolic heart failure: With mostly right-sided heart failure symptoms-- With significant anasarca , abd distention and scrotal swelling -Echo with EF of 60 to 65 % with grade 3 diastolic dysfunction- BNP 2169 (this was 1979 on 09/26/20 -Continue aspirin, Lipitor, Entresto, Toprol --Continue strict I's and O's and daily weights -Continue IV diuresis Get abd ultrasound  2-type 2 diabetes chronically on insulin with hyperglycemia and poor control -Continue  sliding scale insulin  3-hyperlipidemia -Continue lipitor  4-history of CAD (coronary artery disease)- S/p stent placement Continue aspirin,Eliquis,Lipitor, Toprol -No chest pain: -Continue lipitor, beta-blocker and aspirin  5-GERD -continue Pepcid.  6-hx of COPD -Continue the use of Dulera -will continue As needed bronchodilators.  7)History of noncompliance with medication regimen Patient counseled on importance of being compliant with medication regimen  8) social/ethics--- patient currently living with girlfriend, may need home health services upon discharge  Disposition/Need for in-Hospital Stay- patient unable to be discharged at this time due to --significant diastolic dysfunction/right-sided heart failure symptoms with extensive edema/anasarca/scrotal swelling requiring IV diuretics monitoring of electrolytes  Status is: Inpatient  Remains inpatient appropriate because:Please see above   Disposition: The patient is from: Home              Anticipated d/c is to: Home              Anticipated d/c date is: 2 days              Patient currently is not medically stable to d/c. Barriers: Not Clinically Stable-   Code Status :  -  Code Status: Full Code   Family Communication:   NA (patient is alert, awake and coherent)   Consults  :  na  DVT Prophylaxis  :   - SCDs  Place TED hose Start: 09/29/20 0814 SCDs Start: 09/28/20 2326 apixaban (ELIQUIS)  tablet 5 mg    Lab Results  Component Value Date   PLT 454 (H) 09/29/2020    Inpatient Medications  Scheduled Meds: . apixaban  5 mg Oral BID  . aspirin EC  81 mg Oral Q breakfast  . atorvastatin  80 mg Oral Daily  . famotidine  20 mg Oral BID  . furosemide  40 mg Intravenous BID  . insulin aspart  0-5 Units Subcutaneous QHS  . insulin aspart  0-9 Units Subcutaneous TID WC  . metoprolol succinate  12.5 mg Oral Daily  . mometasone-formoterol  2 puff Inhalation BID  . sacubitril-valsartan  1 tablet Oral BID    Continuous Infusions: PRN Meds:.oxyCODONE    Anti-infectives (From admission, onward)   None        Objective:   Vitals:   09/29/20 0405 09/29/20 0536 09/29/20 0848 09/29/20 1152  BP: 107/67 117/77 126/85 119/81  Pulse: 91 90 93 91  Resp: 18 18 18 18   Temp:      TempSrc:      SpO2: 100% 100% 98% 99%  Weight:      Height:        Wt Readings from Last 3 Encounters:  09/28/20 97.1 kg  09/26/20 97.1 kg  09/24/20 97.1 kg    No intake or output data in the 24 hours ending 09/29/20 1818  Physical Exam  Gen:- Awake Alert, chronically ill-appearing HEENT:- [place].AT, No sclera icterus Neck-Supple Neck,No JVD,.  Lungs-diminished breath sounds, no significant rales CV- S1, S2 normal, regular  Abd-  +ve B.Sounds, significant abdominal wall swelling, abdominal distention, scrotal and penile swelling, anasarca Extremity/Skin:-+3 pitting edema with scrotal and penile and abdominal wall involvement, pedal pulses present  Psych-affect is appropriate, oriented x3 Neuro-generalized weakness, no new focal deficits, no tremors   Data Review:   Micro Results Recent Results (from the past 240 hour(s))  Resp Panel by RT-PCR (Flu A&B, Covid) Nasopharyngeal Swab     Status: None   Collection Time: 09/26/20 11:10 PM   Specimen: Nasopharyngeal Swab; Nasopharyngeal(NP) swabs in vial transport medium  Result Value Ref Range Status   SARS Coronavirus 2 by RT PCR NEGATIVE NEGATIVE Final    Comment: (NOTE) SARS-CoV-2 target nucleic acids are NOT DETECTED.  The SARS-CoV-2 RNA is generally detectable in upper respiratory specimens during the acute phase of infection. The lowest concentration of SARS-CoV-2 viral copies this assay can detect is 138 copies/mL. A negative result does not preclude SARS-Cov-2 infection and should not be used as the sole basis for treatment or other patient management decisions. A negative result may occur with  improper specimen collection/handling, submission  of specimen other than nasopharyngeal swab, presence of viral mutation(s) within the areas targeted by this assay, and inadequate number of viral copies(<138 copies/mL). A negative result must be combined with clinical observations, patient history, and epidemiological information. The expected result is Negative.  Fact Sheet for Patients:  EntrepreneurPulse.com.au  Fact Sheet for Healthcare Providers:  IncredibleEmployment.be  This test is no t yet approved or cleared by the [place] FDA and  has been authorized for detection and/or diagnosis of SARS-CoV-2 by FDA under an Emergency Use Authorization (EUA). This EUA will remain  in effect (meaning this test can be used) for the duration of the COVID-19 declaration under Section 564(b)(1) of the Act, 21 U.S.C.section 360bbb-3(b)(1), unless the authorization is terminated  or revoked sooner.       Influenza A by PCR NEGATIVE NEGATIVE Final   Influenza B by PCR  NEGATIVE NEGATIVE Final    Comment: (NOTE) The Xpert Xpress SARS-CoV-2/FLU/RSV plus assay is intended as an aid in the diagnosis of influenza from Nasopharyngeal swab specimens and should not be used as a sole basis for treatment. Nasal washings and aspirates are unacceptable for Xpert Xpress SARS-CoV-2/FLU/RSV testing.  Fact Sheet for Patients: EntrepreneurPulse.com.au  Fact Sheet for Healthcare Providers: IncredibleEmployment.be  This test is not yet approved or cleared by the [place] FDA and has been authorized for detection and/or diagnosis of SARS-CoV-2 by FDA under an Emergency Use Authorization (EUA). This EUA will remain in effect (meaning this test can be used) for the duration of the COVID-19 declaration under Section 564(b)(1) of the Act, 21 U.S.C. section 360bbb-3(b)(1), unless the authorization is terminated or revoked.  Performed at [facility], [address],  [place], [place] [address]   Resp Panel by RT-PCR (Flu A&B, Covid) Nasopharyngeal Swab     Status: None   Collection Time: 09/28/20 10:15 PM   Specimen: Nasopharyngeal Swab; Nasopharyngeal(NP) swabs in vial transport medium  Result Value Ref Range Status   SARS Coronavirus 2 by RT PCR NEGATIVE NEGATIVE Final    Comment: (NOTE) SARS-CoV-2 target nucleic acids are NOT DETECTED.  The SARS-CoV-2 RNA is generally detectable in upper respiratory specimens during the acute phase of infection. The lowest concentration of SARS-CoV-2 viral copies this assay can detect is 138 copies/mL. A negative result does not preclude SARS-Cov-2 infection and should not be used as the sole basis for treatment or other patient management decisions. A negative result may occur with  improper specimen collection/handling, submission of specimen other than nasopharyngeal swab, presence of viral mutation(s) within the areas targeted by this assay, and inadequate number of viral copies(<138 copies/mL). A negative result must be combined with clinical observations, patient history, and epidemiological information. The expected result is Negative.  Fact Sheet for Patients:  EntrepreneurPulse.com.au  Fact Sheet for Healthcare Providers:  IncredibleEmployment.be  This test is no t yet approved or cleared by the [place] FDA and  has been authorized for detection and/or diagnosis of SARS-CoV-2 by FDA under an Emergency Use Authorization (EUA). This EUA will remain  in effect (meaning this test can be used) for the duration of the COVID-19 declaration under Section 564(b)(1) of the Act, 21 U.S.C.section 360bbb-3(b)(1), unless the authorization is terminated  or revoked sooner.       Influenza A by PCR NEGATIVE NEGATIVE Final   Influenza B by PCR NEGATIVE NEGATIVE Final    Comment: (NOTE) The Xpert Xpress SARS-CoV-2/FLU/RSV plus assay is intended as an aid in the diagnosis of  influenza from Nasopharyngeal swab specimens and should not be used as a sole basis for treatment. Nasal washings and aspirates are unacceptable for Xpert Xpress SARS-CoV-2/FLU/RSV testing.  Fact Sheet for Patients: EntrepreneurPulse.com.au  Fact Sheet for Healthcare Providers: IncredibleEmployment.be  This test is not yet approved or cleared by the [place] FDA and has been authorized for detection and/or diagnosis of SARS-CoV-2 by FDA under an Emergency Use Authorization (EUA). This EUA will remain in effect (meaning this test can be used) for the duration of the COVID-19 declaration under Section 564(b)(1) of the Act, 21 U.S.C. section 360bbb-3(b)(1), unless the authorization is terminated or revoked.  Performed at [facility], [address] [place], [place] [address]     Radiology Reports CT ABDOMEN PELVIS W CONTRAST  Result Date: 09/27/2020 CLINICAL DATA:  Swelling of the scrotum and penis, seen 1 week prior for similar symptoms. EXAM: CT  ABDOMEN AND PELVIS WITH CONTRAST TECHNIQUE: Multidetector CT imaging of the abdomen and pelvis was performed using the standard protocol following bolus administration of intravenous contrast. CONTRAST:  100mL OMNIPAQUE IOHEXOL 300 MG/ML  SOLN COMPARISON:  CT 05/30/2020, scrotal ultrasound 11/24/2016 (outside images, no report) FINDINGS: Lower chest: Small left and trace right pleural effusion. Small pericardial effusion. Cardiac size is top normal. Few coronary artery calcifications are present. Hepatobiliary: Heterogeneous attenuation of the liver without focal discernible liver lesion. Smooth liver surface contour. Physiologic distension of the gallbladder. Small gallstone layering dependently within the gallbladder. No frank gallbladder wall thickening or pericholecystic inflammation. No biliary ductal dilatation or intraductal gallstones. Pancreas: Partial fatty replacement of the pancreas. No pancreatic  ductal dilatation or surrounding inflammatory changes. Spleen: Normal in size. No concerning splenic lesions. Adrenals/Urinary Tract: Normal adrenal glands. Kidneys are normally located with symmetric enhancement and excretion. 1.4 cm fluid attenuation cyst seen in the interpolar left kidney. No suspicious renal lesion, urolithiasis or hydronephrosis. Circumferential bladder wall thickening, greater than expected for underdistention. Stomach/Bowel: Distal esophagus, stomach and duodenum are unremarkable. High attenuation enteric contrast material partially traverses the small bowel. No small bowel thickening or dilatation. No evidence of obstruction. Normal appendix in the right lower quadrant focal periappendiceal inflammation. No colonic dilatation or wall thickening. Vascular/Lymphatic: Atherosclerotic calcifications within the abdominal aorta and branch vessels. No aneurysm or ectasia. Enlarged centrally hypoattenuating nodes are present in the bilateral inguinal change, largest on the left measuring up to 19 mm short axis, largest on the right measuring up to 16 mm (2/90, 86). No other enlarged nodes seen in the abdomen or pelvis. Reproductive: The prostate and seminal vesicles are unremarkable. Extensive edematous changes about the mons pubis and included portions of the external genitalia encompassing only the base of the penis on this exam. No soft tissue gas, organized collection or abscess is seen. Other: Extensive severe circumferential body wall edema most pronounced towards the lower abdomen, pelvis and inguinal soft tissues and throughout the mons pubis. No organized collection or abscess is seen. No soft tissue gas. Musculoskeletal: No acute osseous abnormality or suspicious osseous lesion. IMPRESSION: 1. Extensive edematous changes about the mons pubis and included portions of the external genitalia encompassing only the base of the penis on this exam. This could reflect normal distribution of  edematous changes given additional features of anasarca with extreme edematous changes of the body wall. However, should correlate with exam findings to exclude a cellulitis or soft tissue infection. 2. Enlarged centrally hypoattenuating nodes in the bilateral inguinal change, largest on the left measuring up to 19 mm short axis, largest on the right measuring up to 16 mm short axis. Findings are favored to be edematous or reactive though could consider clinical surveillance and reimaging if these persist post resolution of the acute symptoms. 3. Circumferential bladder wall thickening, greater than expected for underdistention. Recommend correlation with urinalysis to exclude cystitis. 4. Small left and trace right pleural effusion. Small pericardial effusion. 5. Heterogeneous attenuation of the liver without focal discernible liver lesion. Finding is nonspecific but could be seen in the setting of hepatic congestion in the setting of heart failure or intrinsic liver disease. Correlate with LFTs. 6. Cholelithiasis without evidence of acute cholecystitis. 7. Aortic Atherosclerosis (ICD10-I70.0). Electronically Signed   By: Kreg ShropshirePrice  DeHay M.D.   On: 09/27/2020 04:08   DG Chest Port 1 View  Result Date: 09/28/2020 CLINICAL DATA:  Short of breath, lower extremity edema, tobacco abuse EXAM: PORTABLE CHEST 1 VIEW COMPARISON:  09/26/2020 FINDINGS: Single frontal view of the chest demonstrates a stable cardiac silhouette. Chronic scarring without airspace disease, effusion, or pneumothorax. No acute bony abnormality. IMPRESSION: 1. No acute intrathoracic process. Electronically Signed   By: Randa Ngo M.D.   On: 09/28/2020 21:13   DG Chest Port 1 View  Result Date: 09/26/2020 CLINICAL DATA:  Testicular swelling, COPD, weakness EXAM: PORTABLE CHEST 1 VIEW COMPARISON:  09/15/2020 FINDINGS: Single frontal view of the chest demonstrates a stable cardiac silhouette. No acute airspace disease, effusion, or  pneumothorax. Mild central vascular congestion unchanged. No acute bony abnormalities. IMPRESSION: 1. Chronic central vascular congestion without evidence of edema. Electronically Signed   By: Randa Ngo M.D.   On: 09/26/2020 20:14   DG Chest Port 1 View  Result Date: 09/15/2020 CLINICAL DATA:  Peripheral edema EXAM: PORTABLE CHEST 1 VIEW COMPARISON:  September 11, 2020 FINDINGS: The heart size and mediastinal contours are mildly enlarged. There is prominence of the central pulmonary vasculature. Both lungs are otherwise clear. The visualized skeletal structures are unremarkable. IMPRESSION: Mild cardiomegaly and pulmonary vascular congestion Electronically Signed   By: Prudencio Pair M.D.   On: 09/15/2020 19:46   DG Chest Portable 1 View  Result Date: 09/02/2020 CLINICAL DATA:  Shortness of breath., COPD, hypertension, asthma, history of cancer. Current smoker. EXAM: PORTABLE CHEST 1 VIEW COMPARISON:  Chest x-ray 07/23/2020, CT angio chest 05/30/2020. FINDINGS: The heart size and mediastinal contours are within normal limits. Hyperinflation and coarsened interstitial markings consistent with emphysematous changes. No focal consolidation. No pulmonary edema. No pleural effusion. No pneumothorax. No acute osseous abnormality. IMPRESSION: 1. No active disease. 2.  Emphysema (ICD10-J43.9). 3. Please evaluate patient on age and smoking history to determine if patient meets the inclusion criteria for specialized low-dose lung cancer screening chest CT. Electronically Signed   By: Iven Finn M.D.   On: 09/02/2020 20:47     CBC Recent Labs  Lab 09/26/20 2005 09/27/20 0312 09/28/20 2030 09/29/20 0355  WBC 8.5 9.3 9.7 8.4  HGB 14.5 13.9 13.9 13.9  HCT 45.0 42.8 42.5 43.7  PLT 437* 458* 449* 454*  MCV 95.9 95.5 94.0 95.4  MCH 30.9 31.0 30.8 30.3  MCHC 32.2 32.5 32.7 31.8  RDW 14.6 14.4 14.8 14.7  LYMPHSABS 1.3  --  1.1  --   MONOABS 0.8  --  1.2*  --   EOSABS 0.1  --  0.0  --   BASOSABS 0.1   --  0.1  --     Chemistries  Recent Labs  Lab 09/26/20 2005 09/27/20 0312 09/28/20 2030 09/29/20 0355  NA 126* 128* 124* 130*  K 4.2 3.7 4.1 4.2  CL 85* 86* 86* 89*  CO2 32 31 27 31   GLUCOSE 518* 116* 584* 109*  BUN 16 16 31* 29*  CREATININE 1.31* 1.16 1.58* 1.36*  CALCIUM 9.1 8.6* 8.3* 8.8*  MG  --  1.9  --  2.1  AST 35 38 47* 46*  ALT 46* 40 44 44  ALKPHOS 185* 157* 148* 147*  BILITOT 0.7 0.7 0.7 0.7   ------------------------------------------------------------------------------------------------------------------ No results for input(s): CHOL, HDL, LDLCALC, TRIG, CHOLHDL, LDLDIRECT in the last 72 hours.  Lab Results  Component Value Date   HGBA1C 14.0 (H) 09/27/2020   ------------------------------------------------------------------------------------------------------------------ No results for input(s): TSH, T4TOTAL, T3FREE, THYROIDAB in the last 72 hours.  Invalid input(s): FREET3 ------------------------------------------------------------------------------------------------------------------ No results for input(s): VITAMINB12, FOLATE, FERRITIN, TIBC, IRON, RETICCTPCT in the last 72 hours.  Coagulation profile Recent Labs  Lab 09/27/20 0312 09/29/20 0355  INR 1.1 1.0    No results for input(s): DDIMER in the last 72 hours.  Cardiac Enzymes No results for input(s): CKMB, TROPONINI, MYOGLOBIN in the last 168 hours.  Invalid input(s): CK ------------------------------------------------------------------------------------------------------------------    Component Value Date/Time   BNP 2,169.0 (H) 09/28/2020 2030    Roxan Hockey M.D on 09/29/2020 at 6:18 PM  Go to [email or web address] - for contact info  [facility] - Office  [phone]

## 2020-09-29 NOTE — Progress Notes (Signed)
Pt left against medical advice. AMA paper signed and 20g IV removed from [place]. Site bleeding reinforced with gauze. Pt would not stay for further assessment. Charge Nurse Hinton Dyer accompanied pt off unit. Rachael Fee NP notified.

## 2020-09-29 NOTE — TOC Initial Note (Signed)
Transition of Care [facility]) - Initial/Assessment Note    Patient Details  Name: Dustin Cruz MRN: [id number] Date of Birth: Aug 13, 1966  Transition of [facility]) CM/SW Contact:    Boneta Lucks, RN Phone Number: 09/29/2020, 1:13 PM  Clinical Narrative:    Patient admitted with acute on chronic CHF, patient in pain with edema all below umbilical area. Patient has a high risk for readmission. Patient still in ED. [name] contacted TOC to discuss housing. She is on patient contact list. She states he has been living with her on and off for 5 years. He was with a cane at time. Has his own vehicle and drives. Drove himself to ED.   Dustin Cruz lives in an income base apartment, she had permission for patient to stay with her a few days but that has turned into months.  Manger is now saying he can not come back to the apartment.  Patient has not family, Parents and brother deceased and a brother in prison. Patient has stayed with others and hotel at times. He has furniture and all necessity to live on his own in storage. Pays $60 storage fee. Patient is gets disability check month.  TOC advise Dustin Cruz to assist with helping patient obtain his own housing voucher. Housing Authority number will be provided to patient. [phone]  TOC to update patient and follow for DC plan.           Expected Discharge Plan: [name] Barriers to Discharge: Continued Medical Work up   Patient Goals and CMS Choice Patient states their goals for this hospitalization and ongoing recovery are:: to get better, maybe home health. CMS Medicare.gov Compare Post Acute Care list provided to:: Other (Comment Required) Dustin Cruz - friend)    Expected Discharge Plan and Services Expected Discharge Plan: [place]    Living arrangements for the past 2 months: Apartment                  Prior Living Arrangements/Services Living arrangements for the past 2 months: Apartment Lives  with:: Friends          Need for Family Participation in Patient Care: Yes (Comment) Care giver support system in place?: Yes (comment)   Criminal Activity/Legal Involvement Pertinent to Current Situation/Hospitalization: No - Comment as needed  Activities of Daily Living Home Assistive Devices/Equipment: Cane (specify quad or straight) ADL Screening (condition at time of admission) Patient's cognitive ability adequate to safely complete daily activities?: Yes Is the patient deaf or have difficulty hearing?: No Does the patient have difficulty seeing, even when wearing glasses/contacts?: No Does the patient have difficulty concentrating, remembering, or making decisions?: No Patient able to express need for assistance with ADLs?: Yes Does the patient have difficulty dressing or bathing?: No Independently performs ADLs?: Yes (appropriate for developmental age) Does the patient have difficulty walking or climbing stairs?: No Weakness of Legs: None Weakness of Arms/Hands: None  Permission Sought/Granted     Emotional Assessment    Orientation: : Oriented to Self,Oriented to Place,Oriented to  Time,Oriented to Situation Alcohol / Substance Use: Tobacco Use Psych Involvement: No (comment)  Admission diagnosis:  Acute on chronic combined systolic and diastolic CHF (congestive heart failure) ([place]) [I50.43] Patient Active Problem List   Diagnosis Date Noted  . Acute on chronic combined systolic and diastolic CHF (congestive heart failure) ([place]) 09/28/2020  . Peripheral edema 09/27/2020  . Acute on chronic systolic (congestive) heart failure ([place]) 09/27/2020  . Scrotum  swelling 09/26/2020  . Acute diastolic CHF (congestive heart failure) ([place]) 07/23/2020  . Abnormal weight gain 07/23/2020  . Noncompliance 07/23/2020  . Pelvic lymphadenopathy 07/20/2020  . Acute on chronic diastolic CHF (congestive heart failure) ([place]) 06/30/2020  . CHF (congestive heart failure) ([place]) 06/29/2020  .  Pre-ulcerative calluses 10/29/2019  . Musculoskeletal pain 10/13/2019  . Housing problems 05/23/2019  . Essential hypertension, benign 03/20/2019  . 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 Near syncope 03/17/2019  . AKI (acute kidney injury) ([place]) 03/17/2019  . 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 CKD stage 3 due to type 2 diabetes mellitus ([place]) 11/28/2018  . Microalbuminuria due to type 2 diabetes mellitus ([place]) 05/22/2018  . Hyperglycemia due to diabetes mellitus ([place]) 04/24/2018  . Abdominal pain, epigastric 11/29/2017  . Diarrhea 11/29/2017  . History of colonic polyps 11/29/2017  . Diffuse abdominal pain 11/29/2017  . Diabetic retinopathy ([place]) 09/21/2015  . Carotid bruit 09/21/2015  . Viral gastroenteritis 06/03/2015  . Hypertriglyceridemia 05/18/2015  . Vitamin D deficiency 12/29/2013  . Nausea alone 04/24/2013  . Frequency of urination 04/14/2013  . Fever, unspecified 04/14/2013  . Hepatic steatosis 12/28/2012  . Peripheral neuropathy 11/25/2012  . Melanoma of skin, site unspecified 11/25/2012  . Mixed hyperlipidemia   . Depression   . Hypertension associated with diabetes ([place])   . GERD (gastroesophageal reflux disease) 04/03/2012  . Current smoker 03/14/2012  . Type 2 diabetes mellitus with stage 3a chronic kidney disease, with long-term current use of insulin ([place])   . CAD (coronary artery disease) 01/06/2011   PCP:  [name], DO Pharmacy:   [id number] - [place], [address] [address] Phone: [phone] Fax: [phone]   Readmission Risk Interventions Readmission Risk Prevention Plan 09/29/2020  Transportation Screening Complete  Medication Review (RN Care Manager) Complete  HRI or Home Care Consult Complete  SW Recovery Care/Counseling Consult Complete  Palliative Care Screening Not Applicable  [place] Not Complete  Some recent data might be hidden

## 2020-09-29 NOTE — Progress Notes (Signed)
Inpatient Diabetes Program Recommendations  AACE/ADA: New Consensus Statement on Inpatient Glycemic Control (2015)  Target Ranges:  Prepandial:   less than 140 mg/dL      Peak postprandial:   less than 180 mg/dL (1-2 hours)      Critically ill patients:  140 - 180 mg/dL   Lab Results  Component Value Date   GLUCAP 144 (H) 09/29/2020   HGBA1C 14.0 (H) 09/27/2020    Review of Glycemic Control  Inpatient Diabetes Program Recommendations:   Spoke with patient via phone and discussed insulin management since patient left AMA on 09/27/20. I spoke with patient on 09/27/20 regarding nutrition and insulin management. Patient states he took his basal insulin while he was gone but didn't take short acting due to not eating. Patient shared he is drinking diet ginseng honey tea. Reviewed with patient honey has sugar and would review label prior to drinking drinks. -Add Lantus 30 units daily (50% home insulin dose) -Novolog 10 units tid meal coverage if eats 50% -Novolog 0-9 units correction tid + hs 0-5 units  Thank you, Bethena Roys E. Marra Fraga, RN, MSN, CDE  Diabetes Coordinator Inpatient Glycemic Control Team Team Pager [phone] (8am-5pm) 09/29/2020 9:32 AM

## 2020-09-29 NOTE — ED Notes (Signed)
Pt adamantly refuses to wear cardiac monitor. Importance explained to pt, but he continues to refuse. Pt wants to walk around in room, but he has unsteady gait. Pt is using his walker for the most part. Pt encouraged to sit down due to safety reasons so he doesn't fall but pt adamantly refuses this due to pain in his scrotum and knees. Pt given pain medication to help relieve pain so pt can possibly sit. Will continue to monitor.

## 2020-09-29 NOTE — ED Notes (Signed)
Pt walking around the room. Pt will not stay on the monitor. Pt states "all that stuff makes me itch."

## 2020-09-29 NOTE — ED Notes (Signed)
Pt put on scrotal sling. Pt resting comfortably in bed at this time.

## 2020-09-29 NOTE — ED Notes (Signed)
Dustin Cruz("Significant Other") called and wanted to speak to the Doctor.  States, "Pt lives with her, but he needs to find somewhere else to live, because he can"t take care of himself".  Nurse informed and # taken [phone]

## 2020-09-29 NOTE — ED Notes (Signed)
Informed MD of significant other's concern for pt inability to care for self and need for somewhere to live. MD to consult social work.

## 2020-09-29 NOTE — Progress Notes (Signed)
Patient left AMA.  Patient educated on importance of taking medication and allowing medication to work.  Patient also encouraged to stay by charge RN, because he previously left AMA on the December 20th.  Patient stated he knew that he left, but he wanted to go home to be with his family for Christmas.  Again encouraged patient to stay, stated if he complied with medication regimen he may be home for Christmas if his swelling reduced.  Patient refused and was adament on elaving, stating his brother was coming to get him. Mid level notified.  Patient walked to ed waiting room.

## 2020-09-29 NOTE — H&P (Signed)
History and Physical  Dustin Cruz:[id number] DOB: 1966-04-22 DOA: 09/28/2020  Referring physician: [name] PCP: [name], DO  Patient coming from: Home  Chief Complaint: Groin swelling  HPI: Dustin Cruz is a 54 y.o. male with medical history significant for CAD s/p stent placement, COPD,Type 2 Diabetes Mellitus, history of noncompliance with medication who presents to the emergency department due to complaint of worsening swelling of his scrotum and penis which has been going on for several days.  He was admitted 3 days ago (12/19) and patient signed out [place] yesterday in the evening (12/20) Patient was seen for same complaint in the ED on 12/12 as well as for elevated blood glucose levels that was treated with IV insulin. Patient returned to the ED on 12/17 for peripheral edema and chronic rash on legs. Patient states that he has been compliant with his home Lasix and diet. He denies chest pain, shortness of breath or any discomfort in urinating despite the scrotal and penile swelling.  ED Course:  In the emergency department, he was hemodynamically stable.  Work-up in the ED showed thrombocytosis, hyponatremia, BUN/creatinine 31/1.58 (baseline creatinine 1.3-1.5) and hyperglycemia.  BNP 2169 (this was 1979/on 12/19).  Albumin 2.8, AST 47, ALP 148.  Chest x-ray showed no acute intra thoracic process.  IV Lasix 40 Mg x1 was given, IV Dilaudid 1 mg was given and insulin subcu 15 units x 1 was given due to hyperglycemia.  Hospitalist was asked to admit patient for further evaluation and management.  Review of Systems: Constitutional: Negative for chills and fever.  HENT: Negative for ear pain and sore throat.   Eyes: Negative for pain and visual disturbance.  Respiratory: Negative for cough, chest tightness and shortness of breath.   Cardiovascular: Negative for chest pain and palpitations.  Gastrointestinal: Negative for abdominal pain and vomiting.  Endocrine:  Negative for polyphagia and polyuria.  Genitourinary: Positive for penile and scrotal swelling.  Negative for decreased urine volume, dysuria, enuresis Musculoskeletal: Positive for bilateral leg swelling.  Negative for arthralgias and back pain.  Skin: Negative for color change and rash.  Allergic/Immunologic: Negative for immunocompromised state.  Neurological: Negative for tremors, syncope, speech difficulty, weakness, light-headedness and headaches.  Hematological: Does not bruise/bleed easily.  All other systems reviewed and are negative   Past Medical History:  Diagnosis Date  . Anxiety   . Asthma   . CKD stage 3 due to type 2 diabetes mellitus ([place])   . COPD (chronic obstructive pulmonary disease) ([place])   . Coronary artery disease    s/p BMS to [place] 10/2010;  Cath 01/22/12 prox 30-40% LAD, LCx ramus w/ hazy 70-80% in-stent restenosis, EF 60% treated medically  . Coronary artery disease    s/p BMS to [facility]us 10/2010;  Cath 01/22/12 prox 30-40% LAD, LCx ramus w/ hazy 70-80% in-stent restenosis, EF 60% treated medically   . Depression   . Diabetes mellitus    Type 2  . Gastroparesis 05/2013  . GERD (gastroesophageal reflux disease)   . HTN (hypertension)   . Hyperlipidemia   . Hyperlipidemia   . Major depression, chronic 08/13/2012  . Melanoma ([place]) 2007   surgery at [facility], Followed by [facility]  . Myocardial infarction ([place]) 2012  . Obesity   . Tobacco abuse   . Tubular adenoma   . Urinary retention    Past Surgical History:  Procedure Laterality Date  . BIOPSY  01/07/2018   Procedure: BIOPSY;  Surgeon: Daneil Dolin,  MD;  Location[facility];  Service: Endoscopy;;  duodenal and gastric biopsy  . BIOPSY  04/25/2018   Procedure: BIOPSY;  Surgeon: Daneil Dolin, MD;  Location: [facility];  Service: Endoscopy;;  ascending and descending and sigmoid biopsies  . CARDIAC CATHETERIZATION     with stent  . COLONOSCOPY N/A 01/27/2013   [facility]:QPRFFM and colonic polyps.  Tubular adenomas, poor bowel prep, one-year follow-up surveillance colonoscopy recommended  . COLONOSCOPY WITH PROPOFOL N/A 04/25/2018   Procedure: COLONOSCOPY WITH PROPOFOL;  Surgeon: Daneil Dolin, MD;  Location: [facility];  Service: Endoscopy;  Laterality: N/A;  11:00am - pt to be prepped INPT and labs to be done as well  . CORONARY ANGIOPLASTY WITH STENT PLACEMENT    . ESOPHAGOGASTRODUODENOSCOPY  04/24/2012   [place]-mild erosive reflux esophagitis,dilated w/43F Venia Minks, small HH, minimal chronic gastric/bulbar erosions(No H pylori)  . ESOPHAGOGASTRODUODENOSCOPY (EGD) WITH PROPOFOL N/A 01/07/2018   Dr. Gala Romney: Erythematous mucosa in the stomach, retained gastric contents, incomplete exam.  Biopsied showed mild chronic gastritis but no H. pylori.  Duodenal biopsy was negative for celiac disease.  [name] FLEXIBLE SIGMOIDOSCOPY N/A 01/07/2018   Dr. Gala Romney: Incomplete colonoscopy due to inadequate bowel prep  . LEFT HEART CATHETERIZATION WITH CORONARY ANGIOGRAM N/A 01/22/2012   Procedure: LEFT HEART CATHETERIZATION WITH CORONARY ANGIOGRAM;  Surgeon: Jolaine Artist, MD;  Location: [facility] CATH LAB;  Service: Cardiovascular;  Laterality: N/A;  . melanoma surgery  2007   [place], removed lymph nodes under arm as well,Left abd    Social History:  reports that he has been smoking cigarettes. He started smoking about 39 years ago. He has a 31.00 [place]-year smoking history. He has never used smokeless tobacco. He reports that he does not drink alcohol and does not use drugs.   No Known Allergies  Family History  Problem Relation Age of Onset  . Stroke Mother   . Alcohol abuse Father   . Heart disease Other   . Other Other        not real familiar with family history  . Lung cancer Other   . Colon cancer Neg Hx   . Colon polyps Neg Hx      Prior to Admission medications   Medication Sig Start Date End Date Taking? Authorizing Provider  acetaminophen (TYLENOL) 325 MG tablet Take 2 tablets (650 mg total)  by mouth every 6 (six) hours as needed for mild pain, moderate pain or fever (or Fever >/= 101). 03/21/19   Roxan Hockey, MD  albuterol (PROVENTIL) (2.5 MG/3ML) 0.083% nebulizer solution Take 3 mLs (2.5 mg total) by nebulization every 6 (six) hours as needed for wheezing or shortness of breath. Dx---44.1 02/16/20   [name], DO  albuterol (VENTOLIN HFA) 108 (90 Base) MCG/ACT inhaler 2 puffs every 6 hours prn 07/01/20   Ronnie Doss M, DO  aspirin EC 81 MG tablet Take 1 tablet (81 mg total) by mouth daily with breakfast. 03/21/19   Roxan Hockey, MD  atorvastatin (LIPITOR) 80 MG tablet Take 1 tablet (80 mg total) by mouth daily. 07/01/20   [name], DO  Blood Glucose Monitoring Suppl (ACCU-CHEK GUIDE) w/Device KIT 1 Piece by Does not apply route as directed. 06/30/20   Claretta Fraise, MD  busPIRone (BUSPAR) 7.5 MG tablet Take 1 tablet by mouth twice daily 09/06/20   [name], DO  ELIQUIS 5 MG TABS tablet Take 1 tablet by mouth twice daily 09/22/20   Ahmed Prima Fransisco Hertz, PA-C  ENTRESTO 24-26 MG Take 1 tablet by mouth 2 (two) times daily. 08/01/20   [provider]  famotidine (PEPCID) 20 MG tablet Take 20 mg by mouth 2 (two) times daily. 12/22/19   [provider]  furosemide (LASIX) 80 MG tablet Take 1 tablet (80 mg total) by mouth 2 (two) times daily. 82/8/00   Delora Fuel, MD  gabapentin (NEURONTIN) 600 MG tablet Take 1 tablet (600 mg total) by mouth 3 (three) times daily. 07/01/20   [name], DO  glucose blood (ACCU-CHEK GUIDE) test strip Use as instructed 07/01/20   Ronnie Doss M, DO  insulin glargine (LANTUS SOLOSTAR) 100 UNIT/ML Solostar Pen Inject 60 Units into the skin every evening. 07/01/20   Ronnie Doss M, DO  lisinopril (ZESTRIL) 10 MG tablet Take 1 tablet (10 mg total) by mouth at bedtime. 07/01/20   [name], DO  metFORMIN (GLUCOPHAGE) 500 MG tablet Take 1 tablet (500 mg total) by mouth 2 (two) times  daily with a meal. Patient not taking: No sig reported 11/12/19   Cassandria Anger, MD  metoprolol succinate (TOPROL-XL) 25 MG 24 hr tablet Take 12.5 mg by mouth daily. 07/30/20   [provider]  mometasone-formoterol (DULERA) 200-5 MCG/ACT AERO Inhale 2 puffs into the lungs in the morning and at bedtime. 07/20/20   [name], DO  nitroGLYCERIN (NITROSTAT) 0.4 MG SL tablet Place 1 tablet (0.4 mg total) under the tongue every 5 (five) minutes as needed. For chest pain. 08/13/12   Reece Packer, NP  oxyCODONE (OXY IR/ROXICODONE) 5 MG immediate release tablet Take 5 mg by mouth 2 (two) times daily as needed. 09/26/20   [provider]  potassium chloride (KLOR-CON) 10 MEQ tablet Take 10 mEq by mouth 2 (two) times daily. 09/16/20   [provider]  tiZANidine (ZANAFLEX) 4 MG tablet Take 4-8 mg by mouth every 6 (six) hours as needed for muscle spasms.    [provider]  traZODone (DESYREL) 150 MG tablet TAKE 1 TABLET BY MOUTH ONCE DAILY AT BEDTIME 08/31/20   Ronnie Doss M, DO  metoprolol tartrate (LOPRESSOR) 25 MG tablet Take 25 mg by mouth 2 (two) times daily.    12/27/11  [provider]    Physical Exam: BP 138/84   Pulse 81   Temp 97.6 F (36.4 C) (Oral)   Resp 11   Ht _0  (1.88 m)   Wt 97.1 kg   SpO2 97%   BMI 27.48 kg/m   . General: 54 y.o. year-old male well developed well nourished in no acute distress.  Alert and oriented x3.  HEENT: NCAT, EOMI  Neck: Supple, trachea media . Cardiovascular: Regular rate and rhythm with no rubs or gallops.  No thyromegaly or JVD noted.  No lower extremity edema. 2/4 pulses in all 4 extremities. [name] Respiratory: Clear to auscultation with no wheezes or rales. Good inspiratory effort. . Abdomen: Soft nontender nondistended with normal bowel sounds x4 quadrants. . Muskuloskeletal: +2 bilateral lower extremity edema from feet to the thighs. No cyanosis, clubbing or edema noted  bilaterally . Neuro: CN II-XII intact, strength, sensation, reflexes . Skin: No ulcerative lesions noted or rashes . Psychiatry: Judgement and insight appear normal. Mood is appropriate for condition and setting          Labs on Admission:  Basic Metabolic Panel: Recent Labs  Lab 09/26/20 2005 09/27/20 0312 09/28/20 2030  NA 126* 128* 124*  K 4.2 3.7 4.1  CL 85* 86*  86*  CO2 32 31 27  GLUCOSE 518* 116* 584*  BUN 16 16 31*  CREATININE 1.31* 1.16 1.58*  CALCIUM 9.1 8.6* 8.3*  MG  --  1.9  --   PHOS  --  3.8  --    Liver Function Tests: Recent Labs  Lab 09/26/20 2005 09/27/20 0312 09/28/20 2030  AST 35 38 47*  ALT 46* 40 44  ALKPHOS 185* 157* 148*  BILITOT 0.7 0.7 0.7  PROT 7.7 6.7 6.7  ALBUMIN 3.1* 2.7* 2.8*   No results for input(s): LIPASE, AMYLASE in the last 168 hours. No results for input(s): AMMONIA in the last 168 hours. CBC: Recent Labs  Lab 09/26/20 2005 09/27/20 0312 09/28/20 2030  WBC 8.5 9.3 9.7  NEUTROABS 6.2  --  7.2  HGB 14.5 13.9 13.9  HCT 45.0 42.8 42.5  MCV 95.9 95.5 94.0  PLT 437* 458* 449*   Cardiac Enzymes: No results for input(s): CKTOTAL, CKMB, CKMBINDEX, TROPONINI in the last 168 hours.  BNP (last 3 results) Recent Labs    09/19/20 0610 09/26/20 2005 09/28/20 2030  BNP 1,435.0* 1,979.0* 2,169.0*    ProBNP (last 3 results) No results for input(s): PROBNP in the last 8760 hours.  CBG: Recent Labs  Lab 09/27/20 1132 09/27/20 1620 09/27/20 1857 09/27/20 2008 09/28/20 2320  GLUCAP 189* 319* 251* 220* 441*    Radiological Exams on Admission: CT ABDOMEN PELVIS W CONTRAST  Result Date: 09/27/2020 CLINICAL DATA:  Swelling of the scrotum and penis, seen 1 week prior for similar symptoms. EXAM: CT ABDOMEN AND PELVIS WITH CONTRAST TECHNIQUE: Multidetector CT imaging of the abdomen and pelvis was performed using the standard protocol following bolus administration of intravenous contrast. CONTRAST:  124m OMNIPAQUE IOHEXOL  300 MG/ML  SOLN COMPARISON:  CT 05/30/2020, scrotal ultrasound 11/24/2016 (outside images, no report) FINDINGS: Lower chest: Small left and trace right pleural effusion. Small pericardial effusion. Cardiac size is top normal. Few coronary artery calcifications are present. Hepatobiliary: Heterogeneous attenuation of the liver without focal discernible liver lesion. Smooth liver surface contour. Physiologic distension of the gallbladder. Small gallstone layering dependently within the gallbladder. No frank gallbladder wall thickening or pericholecystic inflammation. No biliary ductal dilatation or intraductal gallstones. Pancreas: Partial fatty replacement of the pancreas. No pancreatic ductal dilatation or surrounding inflammatory changes. Spleen: Normal in size. No concerning splenic lesions. Adrenals/Urinary Tract: Normal adrenal glands. Kidneys are normally located with symmetric enhancement and excretion. 1.4 cm fluid attenuation cyst seen in the interpolar left kidney. No suspicious renal lesion, urolithiasis or hydronephrosis. Circumferential bladder wall thickening, greater than expected for underdistention. Stomach/Bowel: Distal esophagus, stomach and duodenum are unremarkable. High attenuation enteric contrast material partially traverses the small bowel. No small bowel thickening or dilatation. No evidence of obstruction. Normal appendix in the right lower quadrant focal periappendiceal inflammation. No colonic dilatation or wall thickening. Vascular/Lymphatic: Atherosclerotic calcifications within the abdominal aorta and branch vessels. No aneurysm or ectasia. Enlarged centrally hypoattenuating nodes are present in the bilateral inguinal change, largest on the left measuring up to 19 mm short axis, largest on the right measuring up to 16 mm (2/90, 86). No other enlarged nodes seen in the abdomen or pelvis. Reproductive: The prostate and seminal vesicles are unremarkable. Extensive edematous changes about  the mons pubis and included portions of the external genitalia encompassing only the base of the penis on this exam. No soft tissue gas, organized collection or abscess is seen. Other: Extensive severe circumferential body wall edema most pronounced towards the lower  abdomen, pelvis and inguinal soft tissues and throughout the mons pubis. No organized collection or abscess is seen. No soft tissue gas. Musculoskeletal: No acute osseous abnormality or suspicious osseous lesion. IMPRESSION: 1. Extensive edematous changes about the mons pubis and included portions of the external genitalia encompassing only the base of the penis on this exam. This could reflect normal distribution of edematous changes given additional features of anasarca with extreme edematous changes of the body wall. However, should correlate with exam findings to exclude a cellulitis or soft tissue infection. 2. Enlarged centrally hypoattenuating nodes in the bilateral inguinal change, largest on the left measuring up to 19 mm short axis, largest on the right measuring up to 16 mm short axis. Findings are favored to be edematous or reactive though could consider clinical surveillance and reimaging if these persist post resolution of the acute symptoms. 3. Circumferential bladder wall thickening, greater than expected for underdistention. Recommend correlation with urinalysis to exclude cystitis. 4. Small left and trace right pleural effusion. Small pericardial effusion. 5. Heterogeneous attenuation of the liver without focal discernible liver lesion. Finding is nonspecific but could be seen in the setting of hepatic congestion in the setting of heart failure or intrinsic liver disease. Correlate with LFTs. 6. Cholelithiasis without evidence of acute cholecystitis. 7. Aortic Atherosclerosis (ICD10-I70.0). Electronically Signed   By: Lovena Le M.D.   On: 09/27/2020 04:08   DG Chest Port 1 View  Result Date: 09/28/2020 CLINICAL DATA:  Short of  breath, lower extremity edema, tobacco abuse EXAM: PORTABLE CHEST 1 VIEW COMPARISON:  09/26/2020 FINDINGS: Single frontal view of the chest demonstrates a stable cardiac silhouette. Chronic scarring without airspace disease, effusion, or pneumothorax. No acute bony abnormality. IMPRESSION: 1. No acute intrathoracic process. Electronically Signed   By: Randa Ngo M.D.   On: 09/28/2020 21:13    EKG: I independently viewed the EKG done and my findings are as followed: Sinus arrhythmia with prolonged QTc (501 ms)  Assessment/Plan Present on Admission: . Acute on chronic combined systolic and diastolic CHF (congestive heart failure) ([place]) . CAD (coronary artery disease) . GERD (gastroesophageal reflux disease) . Mixed hyperlipidemia . Hyperglycemia due to diabetes mellitus ([place]) . Essential hypertension, benign . Scrotum swelling . Peripheral edema  Principal Problem:   Acute on chronic combined systolic and diastolic CHF (congestive heart failure) (H[facility]) Active Problems:   CAD (coronary artery disease)   GERD (gastroesophageal reflux disease)   Mixed hyperlipidemia   Hyperglycemia due to diabetes mellitus ([place])   Essential hypertension, benign   Scrotum swelling   Peripheral edema  Acute on chronic combined systolic and diastolic CHF with superimposed scrotal swelling and anasarca Patient was admitted on 12/19 and left AMA yesterday (12/20), he returned today complaining of worsening scrotal swelling and continued fluid in legs as well as increased abdominal girth. BNP 2169 (this was 1979 on 12/19) Continue total input/output, daily weights and fluid restriction Continue IV Lasix 40 twice daily Continue aspirin, Lipitor, Entresto, Toprol Continue Cardiac diet  Echocardiogram done on 07/24/2020 showed LVEF of 60 to 65% with grade 3 diastolic dysfunction.  Hyperglycemia secondary to type 2 diabetes mellitus Continue insulin sliding scale and hypoglycemic  protocol  Hyperlipidemia Continue Lipitor  CAD S/p stent placement Continue aspirin, Eliquis, Lipitor, Toprol Continue telemetry  GERD Continue famotidine  History of COPD Continue Dulera  History of noncompliance with medication regimen Patient counseled on importance of being compliant with medication regimen  DVT prophylaxis: Eliquis  Code Status: Full code  Family Communication:  None at bedside  Disposition Plan:  Patient is from:home Anticipated DC NP:HQNE Anticipated DC date: 2-3 days  Anticipated DC barriers: Patient was unstable to be discharged at this time due to peripheral edema and scrotal swelling secondary to acute on chronic diastolic CHF which require inpatient management  Consults called: None  Admission status:  Inpatient    Bernadette Hoit MD [facility]  09/29/2020, 2:10 AM

## 2020-09-29 NOTE — Plan of Care (Signed)

## 2020-09-30 NOTE — Discharge Summary (Signed)
Pt left AMA late in the evening of 09/29/20 after my shift was over - Pt has a h/o leaving AMA   Please see Full Progress Note dated 09/29/20 AND H and P dated 09/28/20 for Details of his Hospital stay  Roxan Hockey, MD

## 2020-10-04 ENCOUNTER — Emergency Department (HOSPITAL_COMMUNITY)
Admission: EM | Admit: 2020-10-04 | Discharge: 2020-10-04 | Disposition: A | Discharge status: Home / Self Care | Payer: 59 | Attending: Emergency Medicine | Admitting: Emergency Medicine

## 2020-10-04 ENCOUNTER — Other Ambulatory Visit: Payer: Self-pay | Admitting: Family Medicine

## 2020-10-04 ENCOUNTER — Encounter (HOSPITAL_COMMUNITY): Payer: Self-pay | Admitting: Emergency Medicine

## 2020-10-04 ENCOUNTER — Other Ambulatory Visit: Payer: Self-pay

## 2020-10-04 DIAGNOSIS — Z5321 Procedure and treatment not carried out due to patient leaving prior to being seen by health care provider: Secondary | ICD-10-CM | POA: Diagnosis not present

## 2020-10-04 DIAGNOSIS — R059 Cough, unspecified: Secondary | ICD-10-CM

## 2020-10-04 DIAGNOSIS — R600 Localized edema: Secondary | ICD-10-CM | POA: Diagnosis not present

## 2020-10-04 NOTE — ED Triage Notes (Signed)
Pt c/o bilateral leg edema that began yesterday. Pt had a recent hospitalization here on 09/28/20.

## 2020-10-05 ENCOUNTER — Other Ambulatory Visit: Payer: Self-pay

## 2020-10-05 ENCOUNTER — Emergency Department (HOSPITAL_COMMUNITY)
Admission: EM | Admit: 2020-10-05 | Discharge: 2020-10-05 | Disposition: A | Discharge status: Home / Self Care | Payer: 59 | Attending: Emergency Medicine | Admitting: Emergency Medicine

## 2020-10-05 ENCOUNTER — Encounter (HOSPITAL_COMMUNITY): Payer: Self-pay | Admitting: Emergency Medicine

## 2020-10-05 DIAGNOSIS — E1165 Type 2 diabetes mellitus with hyperglycemia: Secondary | ICD-10-CM | POA: Insufficient documentation

## 2020-10-05 DIAGNOSIS — N183 Chronic kidney disease, stage 3 unspecified: Secondary | ICD-10-CM | POA: Insufficient documentation

## 2020-10-05 DIAGNOSIS — Z794 Long term (current) use of insulin: Secondary | ICD-10-CM | POA: Diagnosis not present

## 2020-10-05 DIAGNOSIS — Z79899 Other long term (current) drug therapy: Secondary | ICD-10-CM | POA: Diagnosis not present

## 2020-10-05 DIAGNOSIS — J449 Chronic obstructive pulmonary disease, unspecified: Secondary | ICD-10-CM | POA: Insufficient documentation

## 2020-10-05 DIAGNOSIS — Z7901 Long term (current) use of anticoagulants: Secondary | ICD-10-CM | POA: Insufficient documentation

## 2020-10-05 DIAGNOSIS — R059 Cough, unspecified: Secondary | ICD-10-CM

## 2020-10-05 DIAGNOSIS — I2510 Atherosclerotic heart disease of native coronary artery without angina pectoris: Secondary | ICD-10-CM | POA: Insufficient documentation

## 2020-10-05 DIAGNOSIS — I5043 Acute on chronic combined systolic (congestive) and diastolic (congestive) heart failure: Secondary | ICD-10-CM | POA: Diagnosis not present

## 2020-10-05 DIAGNOSIS — R609 Edema, unspecified: Secondary | ICD-10-CM | POA: Diagnosis not present

## 2020-10-05 DIAGNOSIS — E1122 Type 2 diabetes mellitus with diabetic chronic kidney disease: Secondary | ICD-10-CM | POA: Diagnosis not present

## 2020-10-05 DIAGNOSIS — M79605 Pain in left leg: Secondary | ICD-10-CM | POA: Diagnosis not present

## 2020-10-05 DIAGNOSIS — Z7984 Long term (current) use of oral hypoglycemic drugs: Secondary | ICD-10-CM | POA: Insufficient documentation

## 2020-10-05 DIAGNOSIS — Z7982 Long term (current) use of aspirin: Secondary | ICD-10-CM | POA: Insufficient documentation

## 2020-10-05 DIAGNOSIS — J45909 Unspecified asthma, uncomplicated: Secondary | ICD-10-CM | POA: Diagnosis not present

## 2020-10-05 DIAGNOSIS — I130 Hypertensive heart and chronic kidney disease with heart failure and stage 1 through stage 4 chronic kidney disease, or unspecified chronic kidney disease: Secondary | ICD-10-CM | POA: Diagnosis not present

## 2020-10-05 DIAGNOSIS — M79604 Pain in right leg: Secondary | ICD-10-CM | POA: Insufficient documentation

## 2020-10-05 LAB — BASIC METABOLIC PANEL
Anion gap: 9 (ref 5–15)
BUN: 19 mg/dL (ref 6–20)
CO2: 33 mmol/L — ABNORMAL HIGH (ref 22–32)
Calcium: 8.3 mg/dL — ABNORMAL LOW (ref 8.9–10.3)
Chloride: 88 mmol/L — ABNORMAL LOW (ref 98–111)
Creatinine, Ser: 1.15 mg/dL (ref 0.61–1.24)
GFR, Estimated: >60 mL/min (ref >60)
Glucose, Bld: 359 mg/dL — ABNORMAL HIGH (ref 70–99)
Potassium: 3.9 mmol/L (ref 3.5–5.1)
Sodium: 130 mmol/L — ABNORMAL LOW (ref 135–145)

## 2020-10-05 LAB — CBC
HCT: 42.1 % (ref 39.0–52.0)
Hemoglobin: 13.2 g/dL (ref 13.0–17.0)
MCH: 30.1 pg (ref 26.0–34.0)
MCHC: 31.4 g/dL (ref 30.0–36.0)
MCV: 96.1 fL (ref 80.0–100.0)
Platelets: 453 10*3/uL — ABNORMAL HIGH (ref 150–400)
RBC: 4.38 MIL/uL (ref 4.22–5.81)
RDW: 15 % (ref 11.5–15.5)
WBC: 9.6 10*3/uL (ref 4.0–10.5)
nRBC: 0 % (ref 0.0–0.2)

## 2020-10-05 LAB — BRAIN NATRIURETIC PEPTIDE: B Natriuretic Peptide: 1641 pg/mL — ABNORMAL HIGH (ref 0.0–100.0)

## 2020-10-05 MED ORDER — CEPHALEXIN 250 MG PO CAPS: 250.0000 mg | ORAL_CAPSULE | Freq: Four times a day (QID) | ORAL | 0 refills | Status: DC

## 2020-10-05 MED ORDER — INSULIN ASPART 100 UNIT/ML ~~LOC~~ SOLN
10.0000 [IU] | Freq: Once | SUBCUTANEOUS | Status: AC
  Administered 2020-10-05: 22:00:00 10 [IU] via SUBCUTANEOUS
  Filled 2020-10-05: qty 1

## 2020-10-05 MED ORDER — CEPHALEXIN 500 MG PO CAPS
500.0000 mg | ORAL_CAPSULE | Freq: Once | ORAL | Status: AC
  Administered 2020-10-05: 22:00:00 500 mg via ORAL
  Filled 2020-10-05: qty 1

## 2020-10-05 MED ORDER — MEDICAL COMPRESSION STOCKINGS MISC: 1.0000 | Freq: Every day | 1 refills | Status: DC | PRN

## 2020-10-05 MED ORDER — ALBUTEROL SULFATE HFA 108 (90 BASE) MCG/ACT IN AERS: INHALATION_SPRAY | RESPIRATORY_TRACT | 0 refills | Status: DC

## 2020-10-05 NOTE — ED Triage Notes (Signed)
Pt c/o bilateral lower extremity edema that began 2 days ago.   Pt had a recent hospitalization here on 09/28/20  Pt was here yesterday for the same and LWBS.

## 2020-10-05 NOTE — ED Notes (Signed)
Pt educated on follow up care and scripts.  Pt wheeled to front lobby.  Pt dc'ed

## 2020-10-05 NOTE — ED Provider Notes (Signed)
[facility] Provider Note   CSN: [id number] Arrival date & time: 10/05/20  1059     History Chief Complaint  Patient presents with   Fluid Retention    Dustin Cruz is a 54 y.o. male.  He has a history of CKD and peripheral edema.  He is on diuretics, unclear if he is compliant.  He is complaining of his feet being infected and a sore on his leg.  He says they are painful.  He has had multiple ED visits and admissions for same.  No fever.  No increased shortness of breath from baseline.  The history is provided by the patient.  Leg Pain Location:  Leg Injury: no   Leg location:  R leg and L leg Pain details:    Quality:  Aching   Severity:  Moderate   Onset quality:  Gradual   Timing:  Constant   Progression:  Worsening Chronicity:  Recurrent Relieved by:  None tried Worsened by:  Bearing weight and activity Ineffective treatments:  None tried Associated symptoms: decreased ROM and swelling   Associated symptoms: no fever        Past Medical History:  Diagnosis Date   Anxiety    Asthma    CKD stage 3 due to type 2 diabetes mellitus (HCC)    COPD (chronic obstructive pulmonary disease) ([place])    Coronary artery disease    s/p BMS to [place] 10/2010;  Cath 01/22/12 prox 30-40% LAD, LCx ramus w/ hazy 70-80% in-stent restenosis, EF 60% treated medically   Coronary artery disease    s/p BMS to [facility] 10/2010;  Cath 01/22/12 prox 30-40% LAD, LCx ramus w/ hazy 70-80% in-stent restenosis, EF 60% treated medically    Depression    Diabetes mellitus    Type 2   Gastroparesis 05/2013   GERD (gastroesophageal reflux disease)    HTN (hypertension)    Hyperlipidemia    Hyperlipidemia    Major depression, chronic 08/13/2012   Melanoma ([place]) 2007   surgery at [facility], Followed by [facility]   Myocardial infarction [facility]) 2012   Obesity    Tobacco abuse    Tubular adenoma    Urinary retention     Patient Active Problem List    Diagnosis Date Noted   Acute on chronic combined systolic and diastolic CHF (congestive heart failure) ([place]) 09/28/2020   Peripheral edema 09/27/2020   Acute on chronic systolic (congestive) heart failure ([place]) 09/27/2020   Scrotum swelling 23/55/7322   Acute diastolic CHF (congestive heart failure) ([place]) 07/23/2020   Abnormal weight gain 07/23/2020   Noncompliance 07/23/2020   Pelvic lymphadenopathy 07/20/2020   Acute on chronic diastolic CHF (congestive heart failure) ([place]) 06/30/2020   CHF (congestive heart failure) ([place]) 06/29/2020   Pre-ulcerative calluses 10/29/2019   Musculoskeletal pain 10/13/2019   Housing problems 05/23/2019   Essential hypertension, benign 03/20/2019   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   Near syncope 03/17/2019   AKI (acute kidney injury) ([place]) 03/17/2019   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   CKD stage 3 due to type 2 diabetes mellitus ([place]) 11/28/2018   Microalbuminuria due to type 2 diabetes mellitus ([place]) 05/22/2018   Hyperglycemia due to diabetes mellitus ([place]) 04/24/2018   Abdominal pain, epigastric 11/29/2017   Diarrhea 11/29/2017   History of colonic polyps 11/29/2017   Diffuse abdominal pain 11/29/2017   Diabetic retinopathy ([place]) 09/21/2015   Carotid bruit 09/21/2015   Viral gastroenteritis 06/03/2015   Hypertriglyceridemia 05/18/2015   Vitamin D deficiency 12/29/2013  Nausea alone 04/24/2013   Frequency of urination 04/14/2013   Fever, unspecified 04/14/2013   Hepatic steatosis 12/28/2012   Peripheral neuropathy 11/25/2012   Melanoma of skin, site unspecified 11/25/2012   Mixed hyperlipidemia    Depression    Hypertension associated with diabetes ([place])    GERD (gastroesophageal reflux disease) 04/03/2012   Current smoker 03/14/2012   Type 2 diabetes mellitus with stage 3a chronic kidney disease, with long-term current use of insulin (HCC)    CAD (coronary artery disease) 01/06/2011    Past Surgical  History:  Procedure Laterality Date   BIOPSY  01/07/2018   Procedure: BIOPSY;  Surgeon: Daneil Dolin, MD;  [facility];  Service: Endoscopy;;  duodenal and gastric biopsy   BIOPSY  04/25/2018   Procedure: BIOPSY;  Surgeon: Daneil Dolin, MD;  Location[facility];  Service: Endoscopy;;  ascending and descending and sigmoid biopsies   CARDIAC CATHETERIZATION     with stent   COLONOSCOPY N/A 01/27/2013   JME:QASTMH and colonic polyps. Tubular adenomas, poor bowel prep, one-year follow-up surveillance colonoscopy recommended   COLONOSCOPY WITH PROPOFOL N/A 04/25/2018   Procedure: COLONOSCOPY WITH PROPOFOL;  Surgeon: Daneil Dolin, MD;  Location: [facility];  Service: Endoscopy;  Laterality: N/A;  11:00am - pt to be prepped INPT and labs to be done as well   CORONARY ANGIOPLASTY WITH STENT PLACEMENT     ESOPHAGOGASTRODUODENOSCOPY  04/24/2012   [place]-mild erosive reflux esophagitis,dilated w/44F Venia Minks, small HH, minimal chronic gastric/bulbar erosions(No H pylori)   ESOPHAGOGASTRODUODENOSCOPY (EGD) WITH PROPOFOL N/A 01/07/2018   Dr. Gala Romney: Erythematous mucosa in the stomach, retained gastric contents, incomplete exam.  Biopsied showed mild chronic gastritis but no H. pylori.  Duodenal biopsy was negative for celiac disease.   FLEXIBLE SIGMOIDOSCOPY N/A 01/07/2018   Dr. Gala Romney: Incomplete colonoscopy due to inadequate bowel prep   LEFT HEART CATHETERIZATION WITH CORONARY ANGIOGRAM N/A 01/22/2012   Procedure: LEFT HEART CATHETERIZATION WITH CORONARY ANGIOGRAM;  Surgeon: Jolaine Artist, MD;  Location: [facility] CATH LAB;  Service: Cardiovascular;  Laterality: N/A;   melanoma surgery  2007   [place], removed lymph nodes under arm as well,Left abd       Family History  Problem Relation Age of Onset   Stroke Mother    Alcohol abuse Father    Heart disease Other    Other Other        not real familiar with family history   Lung cancer Other    Colon cancer Neg Hx     Colon polyps Neg Hx     Social History   Tobacco Use   Smoking status: Current Every Day Smoker    Packs/day: 1.00    Years: 31.00    [place] years: 31.00    Types: Cigarettes    Start date: 10/09/1981   Smokeless tobacco: Never Used   Tobacco comment: 1/2 [place] daily 11/01/17  Vaping Use   Vaping Use: Never used  Substance Use Topics   Alcohol use: No    Alcohol/week: 0.0 standard drinks    Comment: 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   Drug use: No    Home Medications Prior to Admission medications   Medication Sig Start Date End Date Taking? Authorizing Provider  albuterol (VENTOLIN HFA) 108 (90 Base) MCG/ACT inhaler INHALE 2 PUFFS BY MOUTH EVERY 6 HRS AS NEEDED 10/05/20  Yes Ronnie Doss M, DO  aspirin EC  81 MG tablet Take 1 tablet (81 mg total) by mouth daily with breakfast. 03/21/19  Yes Emokpae, Courage, MD  atorvastatin (LIPITOR) 80 MG tablet Take 1 tablet (80 mg total) by mouth daily. 07/01/20  Yes Gottschalk, Leatrice Jewels M, DO  busPIRone (BUSPAR) 7.5 MG tablet Take 1 tablet by mouth twice daily 09/06/20  Yes Ronnie Doss M, DO  ELIQUIS 5 MG TABS tablet Take 1 tablet by mouth twice daily 09/22/20  Yes Strader, [place] M, PA-C  ENTRESTO 24-26 MG Take 1 tablet by mouth 2 (two) times daily. 08/01/20  Yes [provider]  furosemide (LASIX) 80 MG tablet Take 1 tablet (80 mg total) by mouth 2 (two) times daily. 85/9/29  Yes Delora Fuel, MD  gabapentin (NEURONTIN) 600 MG tablet Take 1 tablet (600 mg total) by mouth 3 (three) times daily. 07/01/20  Yes Gottschalk, Ashly M, DO  insulin glargine (LANTUS SOLOSTAR) 100 UNIT/ML Solostar Pen Inject 60 Units into the skin every evening. Patient taking differently: Inject 40 Units into the skin every evening. 07/01/20  Yes Gottschalk, Ashly M, DO  lisinopril (ZESTRIL) 10 MG tablet Take 1 tablet (10 mg total) by mouth at bedtime. 07/01/20  Yes Ronnie Doss M,  DO  metFORMIN (GLUCOPHAGE) 500 MG tablet Take 1 tablet (500 mg total) by mouth 2 (two) times daily with a meal. 11/12/19  Yes Nida, Marella Chimes, MD  metoprolol succinate (TOPROL-XL) 25 MG 24 hr tablet Take 12.5 mg by mouth daily. 07/30/20  Yes [provider]  mometasone-formoterol (DULERA) 200-5 MCG/ACT AERO Inhale 2 puffs into the lungs in the morning and at bedtime. 07/20/20  Yes Gottschalk, Leatrice Jewels M, DO  nitroGLYCERIN (NITROSTAT) 0.4 MG SL tablet Place 1 tablet (0.4 mg total) under the tongue every 5 (five) minutes as needed. For chest pain. 08/13/12  Yes Reece Packer, NP  oxyCODONE (OXY IR/ROXICODONE) 5 MG immediate release tablet Take 5 mg by mouth 2 (two) times daily as needed. 09/26/20  Yes [provider]  potassium chloride (KLOR-CON) 10 MEQ tablet Take 10 mEq by mouth 2 (two) times daily. 09/16/20  Yes [provider]  tiZANidine (ZANAFLEX) 4 MG tablet Take 4-8 mg by mouth every 6 (six) hours as needed for muscle spasms.   Yes [provider]  traZODone (DESYREL) 150 MG tablet TAKE 1 TABLET BY MOUTH ONCE DAILY AT BEDTIME Patient taking differently: Take 150 mg by mouth at bedtime. 08/31/20  Yes Ronnie Doss M, DO  acetaminophen (TYLENOL) 325 MG tablet Take 2 tablets (650 mg total) by mouth every 6 (six) hours as needed for mild pain, moderate pain or fever (or Fever >/= 101). Patient not taking: No sig reported 03/21/19   Roxan Hockey, MD  albuterol (PROVENTIL) (2.5 MG/3ML) 0.083% nebulizer solution Take 3 mLs (2.5 mg total) by nebulization every 6 (six) hours as needed for wheezing or shortness of breath. Dx---44.1 02/16/20   [name], DO  Blood Glucose Monitoring Suppl (ACCU-CHEK GUIDE) w/Device KIT 1 Piece by Does not apply route as directed. 06/30/20   Claretta Fraise, MD  famotidine (PEPCID) 20 MG tablet Take 20 mg by mouth 2 (two) times daily. Patient not taking: No sig reported 12/22/19   [provider]  glucose blood  (ACCU-CHEK GUIDE) test strip Use as instructed 07/01/20   Ronnie Doss M, DO  metoprolol tartrate (LOPRESSOR) 25 MG tablet Take 25 mg by mouth 2 (two) times daily.    12/27/11  [provider]    Allergies    Patient  has no known allergies.  Review of Systems   Review of Systems  Constitutional: Negative for fever.  HENT: Negative for sore throat.   Eyes: Negative for visual disturbance.  Respiratory: Negative for shortness of breath.   Cardiovascular: Negative for chest pain.  Gastrointestinal: Negative for abdominal pain.  Genitourinary: Negative for dysuria.  Skin: Positive for rash and wound.  Neurological: Negative for headaches.    Physical Exam Updated Vital Signs BP 140/88    Pulse 88    Temp 97.8 F (36.6 C) (Oral)    Resp 16    Ht '6\' 2"'  (1.88 m)    Wt 97.1 kg    SpO2 99%    BMI 27.48 kg/m   Physical Exam Vitals and nursing note reviewed.  Constitutional:      Appearance: Normal appearance. He is well-developed and well-nourished. He is not ill-appearing.  HENT:     Head: Normocephalic and atraumatic.  Eyes:     Conjunctiva/sclera: Conjunctivae normal.  Cardiovascular:     Rate and Rhythm: Normal rate and regular rhythm.     Heart sounds: No murmur heard.   Pulmonary:     Effort: Pulmonary effort is normal. No respiratory distress.     Breath sounds: Normal breath sounds.  Abdominal:     Palpations: Abdomen is soft.     Tenderness: There is no abdominal tenderness.  Musculoskeletal:        General: Tenderness present. No deformity.     Cervical back: Neck supple.     Right lower leg: Edema present.     Left lower leg: Edema present.     Comments: Both of his legs have 3+ edema.  There is some sloughing of some skin and some scratch marks.  He is a superficial wound behind his inner right knee where there is some skin fold maceration.  Nothing looks overtly infected.  Skin:    General: Skin is warm and dry.  Neurological:     Mental Status: He  is alert.  Psychiatric:        Mood and Affect: Mood and affect normal.     ED Results / Procedures / Treatments   Labs (all labs ordered are listed, but only abnormal results are displayed) Labs Reviewed  BASIC METABOLIC PANEL - Abnormal; Notable for the following components:      Result Value   Sodium 130 (*)    Chloride 88 (*)    CO2 33 (*)    Glucose, Bld 359 (*)    Calcium 8.3 (*)    All other components within normal limits  CBC - Abnormal; Notable for the following components:   Platelets 453 (*)    All other components within normal limits  BRAIN NATRIURETIC PEPTIDE - Abnormal; Notable for the following components:   B Natriuretic Peptide 1,641.0 (*)    All other components within normal limits    EKG None  Radiology No results found.  Procedures Procedures (including critical care time)  Medications Ordered in ED Medications  cephALEXin (KEFLEX) capsule 500 mg (500 mg Oral Given 10/05/20 2158)  insulin aspart (novoLOG) injection 10 Units (10 Units Subcutaneous Given 10/05/20 2158)    ED Course  I have reviewed the triage vital signs and the nursing notes.  Pertinent labs & imaging results that were available during my care of the patient were reviewed by me and considered in my medical decision making (see chart for details).    MDM Rules/Calculators/A&P  This patient complains of infection and pain in his feet and legs; this involves an extensive number of treatment Options and is a complaint that carries with it a high risk of complications and Morbidity. The differential includes peripheral edema, cellulitis, DVT, arterial insufficiency  I ordered, reviewed and interpreted labs, which included CBC with normal white count normal hemoglobin, platelets slightly elevated likely reactive, chemistry with low sodium likely pseudohyponatremia with elevated glucose, BNP elevated although better than his baseline I ordered medication oral  antibiotics and subcu insulin Previous records obtained and reviewed in epic, frequent ED visits for similar complaints.  Sometimes he gets admitted sometimes he was discharged.  After the interventions stated above, I reevaluated the patient and found patient to be nontoxic-appearing in no distress satting 99% on room air.  He was given antibiotics although I doubt there is no infection currently.  Given insulin for his elevated glucose.  Nurses provided assistance in applying some cream to his feet.  I currently do not see any need for admission.  Offered to place a consult for home health assistance.  Patient states he is currently couch surfing and does not have a stable residence.  Recommended close follow-up with his PCP.  Return instructions discussed.   Final Clinical Impression(s) / ED Diagnoses Final diagnoses:  Peripheral edema    Rx / DC Orders ED Discharge Orders         Ordered    cephALEXin (KEFLEX) 250 MG capsule  4 times daily        10/05/20 2213    Elastic Bandages & Supports (MEDICAL COMPRESSION STOCKINGS) MISC  Daily PRN        10/05/20 2219    albuterol (VENTOLIN HFA) 108 (90 Base) MCG/ACT inhaler        10/05/20 2219           Hayden Rasmussen, MD 10/06/20 1235

## 2020-10-05 NOTE — Discharge Instructions (Addendum)
You were seen in the emergency department for swelling in your legs and breakdown of some of the skin due to the swelling.  There was no obvious sign of infection, you had no fever or elevation in your white count.  We are putting you on some antibiotics to try to keep an infection from forming.  It will be important for you to continue your Lasix and elevate your feet.  Please follow-up with your primary care doctor.  Return to the emergency department if any worsening or concerning symptoms.  Please also use support stockings

## 2020-10-05 NOTE — ED Notes (Signed)
Pt feet soaked in sterile water and clenaserd with soap.  Feet dried with a towel, and between toes.  Pt educated on following up with a podiatrist.  Pt feet dressed with new hospital socks legs lotioned with barrier cream and feet with socks placed in shoes.  Pt vrbalized understanding of care.  Stating hes doing the best he can he is homeless and needs someone to take care of his feet and wash them.  Pt not happy with care plan provider aware.

## 2020-10-06 ENCOUNTER — Other Ambulatory Visit: Payer: Self-pay | Admitting: Family Medicine

## 2020-10-06 DIAGNOSIS — R059 Cough, unspecified: Secondary | ICD-10-CM

## 2020-10-07 ENCOUNTER — Telehealth: Payer: Self-pay | Admitting: Family Medicine

## 2020-10-07 MED ORDER — GABAPENTIN 600 MG PO TABS: 600.0000 mg | ORAL_TABLET | Freq: Three times a day (TID) | ORAL | 0 refills | Status: DC

## 2020-10-07 NOTE — Telephone Encounter (Signed)
°  Prescription Request  10/07/2020  What is the name of the medication or equipment? Gabapentin   Have you contacted your pharmacy to request a refill? (if applicable) yes  Which pharmacy would you like this sent to? [facility]   *[facility]    Patient notified that their request is being sent to the clinical staff for review and that they should receive a response within 2 business days.

## 2020-10-08 ENCOUNTER — Other Ambulatory Visit: Payer: Self-pay | Admitting: Family Medicine

## 2020-10-14 ENCOUNTER — Ambulatory Visit: Payer: 59 | Admitting: Family Medicine

## 2020-10-18 ENCOUNTER — Encounter: Payer: Self-pay | Admitting: Family Medicine

## 2020-10-22 ENCOUNTER — Other Ambulatory Visit: Payer: Self-pay

## 2020-10-22 ENCOUNTER — Emergency Department (HOSPITAL_COMMUNITY): Payer: 59

## 2020-10-22 ENCOUNTER — Inpatient Hospital Stay (HOSPITAL_COMMUNITY)
Admission: EM | Admit: 2020-10-22 | Discharge: 2020-11-18 | DRG: 291 | Disposition: A | Discharge status: Skilled Nursing Facility | Payer: 59 | Attending: Family Medicine | Admitting: Family Medicine

## 2020-10-22 ENCOUNTER — Encounter (HOSPITAL_COMMUNITY): Payer: Self-pay

## 2020-10-22 DIAGNOSIS — I252 Old myocardial infarction: Secondary | ICD-10-CM

## 2020-10-22 DIAGNOSIS — Z9119 Patient's noncompliance with other medical treatment and regimen: Secondary | ICD-10-CM

## 2020-10-22 DIAGNOSIS — N1831 Chronic kidney disease, stage 3a: Secondary | ICD-10-CM | POA: Diagnosis present

## 2020-10-22 DIAGNOSIS — E1165 Type 2 diabetes mellitus with hyperglycemia: Secondary | ICD-10-CM | POA: Diagnosis present

## 2020-10-22 DIAGNOSIS — Z6826 Body mass index (BMI) 26.0-26.9, adult: Secondary | ICD-10-CM

## 2020-10-22 DIAGNOSIS — L97929 Non-pressure chronic ulcer of unspecified part of left lower leg with unspecified severity: Secondary | ICD-10-CM

## 2020-10-22 DIAGNOSIS — E11649 Type 2 diabetes mellitus with hypoglycemia without coma: Secondary | ICD-10-CM | POA: Diagnosis present

## 2020-10-22 DIAGNOSIS — I429 Cardiomyopathy, unspecified: Secondary | ICD-10-CM | POA: Diagnosis present

## 2020-10-22 DIAGNOSIS — R601 Generalized edema: Secondary | ICD-10-CM | POA: Diagnosis present

## 2020-10-22 DIAGNOSIS — Z20822 Contact with and (suspected) exposure to covid-19: Secondary | ICD-10-CM | POA: Diagnosis present

## 2020-10-22 DIAGNOSIS — L03113 Cellulitis of right upper limb: Secondary | ICD-10-CM | POA: Diagnosis present

## 2020-10-22 DIAGNOSIS — I5033 Acute on chronic diastolic (congestive) heart failure: Secondary | ICD-10-CM | POA: Diagnosis not present

## 2020-10-22 DIAGNOSIS — J449 Chronic obstructive pulmonary disease, unspecified: Secondary | ICD-10-CM | POA: Diagnosis present

## 2020-10-22 DIAGNOSIS — Z86718 Personal history of other venous thrombosis and embolism: Secondary | ICD-10-CM | POA: Diagnosis not present

## 2020-10-22 DIAGNOSIS — E1143 Type 2 diabetes mellitus with diabetic autonomic (poly)neuropathy: Secondary | ICD-10-CM | POA: Diagnosis present

## 2020-10-22 DIAGNOSIS — N183 Chronic kidney disease, stage 3 unspecified: Secondary | ICD-10-CM | POA: Diagnosis present

## 2020-10-22 DIAGNOSIS — Z79899 Other long term (current) drug therapy: Secondary | ICD-10-CM

## 2020-10-22 DIAGNOSIS — R000 Tachycardia, unspecified: Secondary | ICD-10-CM | POA: Diagnosis not present

## 2020-10-22 DIAGNOSIS — I130 Hypertensive heart and chronic kidney disease with heart failure and stage 1 through stage 4 chronic kidney disease, or unspecified chronic kidney disease: Secondary | ICD-10-CM | POA: Diagnosis present

## 2020-10-22 DIAGNOSIS — I484 Atypical atrial flutter: Secondary | ICD-10-CM | POA: Diagnosis not present

## 2020-10-22 DIAGNOSIS — Z9114 Patient's other noncompliance with medication regimen: Secondary | ICD-10-CM | POA: Diagnosis not present

## 2020-10-22 DIAGNOSIS — R9431 Abnormal electrocardiogram [ECG] [EKG]: Secondary | ICD-10-CM | POA: Diagnosis not present

## 2020-10-22 DIAGNOSIS — E1122 Type 2 diabetes mellitus with diabetic chronic kidney disease: Secondary | ICD-10-CM | POA: Diagnosis present

## 2020-10-22 DIAGNOSIS — E8809 Other disorders of plasma-protein metabolism, not elsewhere classified: Secondary | ICD-10-CM | POA: Diagnosis present

## 2020-10-22 DIAGNOSIS — U071 COVID-19: Secondary | ICD-10-CM | POA: Diagnosis not present

## 2020-10-22 DIAGNOSIS — R0902 Hypoxemia: Secondary | ICD-10-CM

## 2020-10-22 DIAGNOSIS — E11319 Type 2 diabetes mellitus with unspecified diabetic retinopathy without macular edema: Secondary | ICD-10-CM | POA: Diagnosis present

## 2020-10-22 DIAGNOSIS — I5021 Acute systolic (congestive) heart failure: Secondary | ICD-10-CM | POA: Diagnosis not present

## 2020-10-22 DIAGNOSIS — I472 Ventricular tachycardia: Secondary | ICD-10-CM | POA: Diagnosis not present

## 2020-10-22 DIAGNOSIS — Z823 Family history of stroke: Secondary | ICD-10-CM

## 2020-10-22 DIAGNOSIS — I10 Essential (primary) hypertension: Secondary | ICD-10-CM | POA: Diagnosis present

## 2020-10-22 DIAGNOSIS — I5043 Acute on chronic combined systolic (congestive) and diastolic (congestive) heart failure: Secondary | ICD-10-CM | POA: Diagnosis present

## 2020-10-22 DIAGNOSIS — E782 Mixed hyperlipidemia: Secondary | ICD-10-CM | POA: Diagnosis present

## 2020-10-22 DIAGNOSIS — I2510 Atherosclerotic heart disease of native coronary artery without angina pectoris: Secondary | ICD-10-CM | POA: Diagnosis present

## 2020-10-22 DIAGNOSIS — F17210 Nicotine dependence, cigarettes, uncomplicated: Secondary | ICD-10-CM | POA: Diagnosis present

## 2020-10-22 DIAGNOSIS — Z955 Presence of coronary angioplasty implant and graft: Secondary | ICD-10-CM

## 2020-10-22 DIAGNOSIS — Z7901 Long term (current) use of anticoagulants: Secondary | ICD-10-CM

## 2020-10-22 DIAGNOSIS — M25521 Pain in right elbow: Secondary | ICD-10-CM

## 2020-10-22 DIAGNOSIS — E46 Unspecified protein-calorie malnutrition: Secondary | ICD-10-CM | POA: Diagnosis present

## 2020-10-22 DIAGNOSIS — L97921 Non-pressure chronic ulcer of unspecified part of left lower leg limited to breakdown of skin: Secondary | ICD-10-CM | POA: Diagnosis not present

## 2020-10-22 DIAGNOSIS — Z794 Long term (current) use of insulin: Secondary | ICD-10-CM

## 2020-10-22 DIAGNOSIS — W19XXXA Unspecified fall, initial encounter: Secondary | ICD-10-CM

## 2020-10-22 DIAGNOSIS — Z5902 Unsheltered homelessness: Secondary | ICD-10-CM

## 2020-10-22 DIAGNOSIS — Z811 Family history of alcohol abuse and dependence: Secondary | ICD-10-CM

## 2020-10-22 DIAGNOSIS — Z7982 Long term (current) use of aspirin: Secondary | ICD-10-CM

## 2020-10-22 DIAGNOSIS — R059 Cough, unspecified: Secondary | ICD-10-CM

## 2020-10-22 DIAGNOSIS — Z85820 Personal history of malignant melanoma of skin: Secondary | ICD-10-CM

## 2020-10-22 LAB — COMPREHENSIVE METABOLIC PANEL
ALT: 33 U/L (ref 0–44)
AST: 32 U/L (ref 15–41)
Albumin: 2 g/dL — ABNORMAL LOW (ref 3.5–5.0)
Alkaline Phosphatase: 135 U/L — ABNORMAL HIGH (ref 38–126)
Anion gap: 10 (ref 5–15)
BUN: 16 mg/dL (ref 6–20)
CO2: 30 mmol/L (ref 22–32)
Calcium: 8.2 mg/dL — ABNORMAL LOW (ref 8.9–10.3)
Chloride: 88 mmol/L — ABNORMAL LOW (ref 98–111)
Creatinine, Ser: 1.38 mg/dL — ABNORMAL HIGH (ref 0.61–1.24)
GFR, Estimated: >60 mL/min (ref >60)
Glucose, Bld: 538 mg/dL (ref 70–99)
Potassium: 4 mmol/L (ref 3.5–5.1)
Sodium: 128 mmol/L — ABNORMAL LOW (ref 135–145)
Total Bilirubin: 0.8 mg/dL (ref 0.3–1.2)
Total Protein: 6 g/dL — ABNORMAL LOW (ref 6.5–8.1)

## 2020-10-22 LAB — CBC WITH DIFFERENTIAL/PLATELET
Abs Immature Granulocytes: 0.02 10*3/uL (ref 0.00–0.07)
Basophils Absolute: 0.1 10*3/uL (ref 0.0–0.1)
Basophils Relative: 1 %
Eosinophils Absolute: 0.1 10*3/uL (ref 0.0–0.5)
Eosinophils Relative: 1 %
HCT: 37.9 % — ABNORMAL LOW (ref 39.0–52.0)
Hemoglobin: 12.1 g/dL — ABNORMAL LOW (ref 13.0–17.0)
Immature Granulocytes: 0 %
Lymphocytes Relative: 12 %
Lymphs Abs: 0.8 10*3/uL (ref 0.7–4.0)
MCH: 30 pg (ref 26.0–34.0)
MCHC: 31.9 g/dL (ref 30.0–36.0)
MCV: 94 fL (ref 80.0–100.0)
Monocytes Absolute: 1 10*3/uL (ref 0.1–1.0)
Monocytes Relative: 15 %
Neutro Abs: 4.8 10*3/uL (ref 1.7–7.7)
Neutrophils Relative %: 71 %
Platelets: 424 10*3/uL — ABNORMAL HIGH (ref 150–400)
RBC: 4.03 MIL/uL — ABNORMAL LOW (ref 4.22–5.81)
RDW: 15.3 % (ref 11.5–15.5)
WBC: 6.6 10*3/uL (ref 4.0–10.5)
nRBC: 0 % (ref 0.0–0.2)

## 2020-10-22 LAB — RESP PANEL BY RT-PCR (FLU A&B, COVID) ARPGX2
Influenza A by PCR: NEGATIVE
Influenza B by PCR: NEGATIVE
SARS Coronavirus 2 by RT PCR: NEGATIVE

## 2020-10-22 LAB — CBG MONITORING, ED
Glucose-Capillary: 415 mg/dL — ABNORMAL HIGH (ref 70–99)
Glucose-Capillary: 206 mg/dL — ABNORMAL HIGH (ref 70–99)
Glucose-Capillary: 100 mg/dL — ABNORMAL HIGH (ref 70–99)
Glucose-Capillary: 118 mg/dL — ABNORMAL HIGH (ref 70–99)

## 2020-10-22 LAB — BRAIN NATRIURETIC PEPTIDE: B Natriuretic Peptide: 1662 pg/mL — ABNORMAL HIGH (ref 0.0–100.0)

## 2020-10-22 MED ORDER — FUROSEMIDE 10 MG/ML IJ SOLN
40.0000 mg | Freq: Once | INTRAMUSCULAR | Status: AC
  Administered 2020-10-22: 40 mg via INTRAVENOUS
  Filled 2020-10-22: qty 4

## 2020-10-22 MED ORDER — POTASSIUM CHLORIDE CRYS ER 10 MEQ PO TBCR
10.0000 meq | EXTENDED_RELEASE_TABLET | Freq: Two times a day (BID) | ORAL | Status: DC
  Administered 2020-10-22 – 2020-11-06 (×30): 10 meq via ORAL
  Filled 2020-10-22 (×31): qty 1

## 2020-10-22 MED ORDER — GABAPENTIN 300 MG PO CAPS
600.0000 mg | ORAL_CAPSULE | Freq: Three times a day (TID) | ORAL | Status: DC
  Administered 2020-10-22 – 2020-10-23 (×3): 600 mg via ORAL
  Filled 2020-10-22 (×3): qty 2

## 2020-10-22 MED ORDER — BUSPIRONE HCL 5 MG PO TABS
7.5000 mg | ORAL_TABLET | Freq: Two times a day (BID) | ORAL | Status: DC
  Administered 2020-10-22 – 2020-11-18 (×55): 7.5 mg via ORAL
  Filled 2020-10-22 (×55): qty 2

## 2020-10-22 MED ORDER — FUROSEMIDE 10 MG/ML IJ SOLN
80.0000 mg | Freq: Two times a day (BID) | INTRAMUSCULAR | Status: DC
  Administered 2020-10-22 – 2020-10-24 (×4): 80 mg via INTRAVENOUS
  Filled 2020-10-22 (×4): qty 8

## 2020-10-22 MED ORDER — APIXABAN 5 MG PO TABS
5.0000 mg | ORAL_TABLET | Freq: Two times a day (BID) | ORAL | Status: DC
  Administered 2020-10-22 – 2020-11-18 (×55): 5 mg via ORAL
  Filled 2020-10-22 (×55): qty 1

## 2020-10-22 MED ORDER — INSULIN GLARGINE 100 UNIT/ML ~~LOC~~ SOLN
40.0000 [IU] | Freq: Every evening | SUBCUTANEOUS | Status: DC
  Administered 2020-10-23 – 2020-10-24 (×2): 40 [IU] via SUBCUTANEOUS
  Filled 2020-10-22 (×3): qty 0.4

## 2020-10-22 MED ORDER — ACETAMINOPHEN 325 MG PO TABS
650.0000 mg | ORAL_TABLET | Freq: Four times a day (QID) | ORAL | Status: DC | PRN
  Administered 2020-10-24 – 2020-11-16 (×7): 650 mg via ORAL
  Filled 2020-10-22 (×7): qty 2

## 2020-10-22 MED ORDER — FAMOTIDINE 20 MG PO TABS
20.0000 mg | ORAL_TABLET | Freq: Two times a day (BID) | ORAL | Status: DC
  Administered 2020-10-22 – 2020-11-18 (×55): 20 mg via ORAL
  Filled 2020-10-22 (×56): qty 1

## 2020-10-22 MED ORDER — INSULIN ASPART 100 UNIT/ML ~~LOC~~ SOLN
4.0000 [IU] | Freq: Three times a day (TID) | SUBCUTANEOUS | Status: DC
  Administered 2020-10-23 – 2020-10-24 (×5): 4 [IU] via SUBCUTANEOUS

## 2020-10-22 MED ORDER — SODIUM CHLORIDE 0.9% FLUSH
3.0000 mL | Freq: Two times a day (BID) | INTRAVENOUS | Status: DC
  Administered 2020-10-23 – 2020-11-12 (×39): 3 mL via INTRAVENOUS

## 2020-10-22 MED ORDER — ACETAMINOPHEN 650 MG RE SUPP: 650.0000 mg | Freq: Four times a day (QID) | RECTAL | Status: DC | PRN

## 2020-10-22 MED ORDER — INSULIN ASPART 100 UNIT/ML ~~LOC~~ SOLN: 0.0000 [IU] | Freq: Every day | SUBCUTANEOUS | Status: DC

## 2020-10-22 MED ORDER — SACUBITRIL-VALSARTAN 24-26 MG PO TABS
1.0000 | ORAL_TABLET | Freq: Two times a day (BID) | ORAL | Status: DC
  Administered 2020-10-22 – 2020-10-23 (×3): 1 via ORAL
  Filled 2020-10-22 (×3): qty 1

## 2020-10-22 MED ORDER — ONDANSETRON HCL 4 MG/2ML IJ SOLN: 4.0000 mg | Freq: Four times a day (QID) | INTRAMUSCULAR | Status: DC | PRN

## 2020-10-22 MED ORDER — INSULIN ASPART 100 UNIT/ML IV SOLN
10.0000 [IU] | Freq: Once | INTRAVENOUS | Status: AC
  Administered 2020-10-22: 10 [IU] via INTRAVENOUS

## 2020-10-22 MED ORDER — SODIUM CHLORIDE 0.9 % IV SOLN: 250.0000 mL | INTRAVENOUS | Status: DC | PRN

## 2020-10-22 MED ORDER — INSULIN ASPART 100 UNIT/ML IV SOLN: 10.0000 [IU] | Freq: Once | INTRAVENOUS | Status: DC

## 2020-10-22 MED ORDER — INSULIN ASPART 100 UNIT/ML ~~LOC~~ SOLN
0.0000 [IU] | Freq: Three times a day (TID) | SUBCUTANEOUS | Status: DC
  Administered 2020-10-23: 15 [IU] via SUBCUTANEOUS
  Administered 2020-10-23: 7 [IU] via SUBCUTANEOUS
  Administered 2020-10-23: 11 [IU] via SUBCUTANEOUS
  Administered 2020-10-24: 7 [IU] via SUBCUTANEOUS

## 2020-10-22 MED ORDER — ASPIRIN EC 81 MG PO TBEC
81.0000 mg | DELAYED_RELEASE_TABLET | Freq: Every day | ORAL | Status: DC
  Administered 2020-10-23 – 2020-11-02 (×11): 81 mg via ORAL
  Filled 2020-10-22 (×11): qty 1

## 2020-10-22 MED ORDER — TRAZODONE HCL 50 MG PO TABS
150.0000 mg | ORAL_TABLET | Freq: Every day | ORAL | Status: DC
  Administered 2020-10-22 – 2020-11-17 (×27): 150 mg via ORAL
  Filled 2020-10-22 (×27): qty 3

## 2020-10-22 MED ORDER — MOMETASONE FURO-FORMOTEROL FUM 200-5 MCG/ACT IN AERO
2.0000 | INHALATION_SPRAY | Freq: Two times a day (BID) | RESPIRATORY_TRACT | Status: DC
  Administered 2020-10-22 – 2020-11-18 (×53): 2 via RESPIRATORY_TRACT
  Filled 2020-10-22 (×3): qty 8.8

## 2020-10-22 MED ORDER — INSULIN ASPART 100 UNIT/ML ~~LOC~~ SOLN
20.0000 [IU] | Freq: Once | SUBCUTANEOUS | Status: AC
  Administered 2020-10-22: 20 [IU] via SUBCUTANEOUS
  Filled 2020-10-22: qty 1

## 2020-10-22 MED ORDER — ONDANSETRON HCL 4 MG PO TABS: 4.0000 mg | ORAL_TABLET | Freq: Four times a day (QID) | ORAL | Status: DC | PRN

## 2020-10-22 MED ORDER — ATORVASTATIN CALCIUM 40 MG PO TABS
80.0000 mg | ORAL_TABLET | Freq: Every day | ORAL | Status: DC
Start: 2020-10-22 — End: 2020-11-18
  Administered 2020-10-22 – 2020-11-18 (×28): 80 mg via ORAL
  Filled 2020-10-22 (×28): qty 2

## 2020-10-22 MED ORDER — SODIUM CHLORIDE 0.9% FLUSH: 3.0000 mL | INTRAVENOUS | Status: DC | PRN

## 2020-10-22 MED ORDER — IPRATROPIUM-ALBUTEROL 0.5-2.5 (3) MG/3ML IN SOLN
3.0000 mL | Freq: Four times a day (QID) | RESPIRATORY_TRACT | Status: DC | PRN
  Administered 2020-10-27 – 2020-11-07 (×5): 3 mL via RESPIRATORY_TRACT
  Filled 2020-10-22 (×5): qty 3

## 2020-10-22 NOTE — ED Notes (Signed)
Date and time results received: 10/22/20 0947  Test: Glucose Critical Value: 538  Name of Provider Notified: [place]  Orders Received? Or Actions Taken?: Orders Received - See Orders for details

## 2020-10-22 NOTE — ED Notes (Signed)
Attempted to insert foley catheter. Unsuccessful due to severe edema to penis and unable to visualize meatus.

## 2020-10-22 NOTE — H&P (Addendum)
History and Physical    Dustin Cruz VOZ:[id number] DOB: Aug 09, 1966 DOA: 10/22/2020  PCP: [name], DO   Patient coming from: Home  Chief Complaint: Worsening lower extremity and abdominal swelling  HPI: Dustin Cruz is a 54 y.o. male with medical history significant for CAD, type 2 diabetes, diastolic heart failure, GERD, and COPD who presented to the ED with worsening lower extremity edema as well as abdominal edema and scrotal edema as well as some weakness.  He is currently homeless and lives in his car.  He states that he had run out of his Lasix as well as his diabetes medication for the last few days.  It appears that on last admission on 09/2020 that he left AMA as he was being treated for edema at that time.  He denies any fever, chills, abdominal pain, nausea, vomiting, chest pain, or other concerns.   ED Course: Patient noted to have stable vital signs, but initial glucose is over 500.  BNP is 1662 and creatinine is 1.38 with baseline near 1.1.  Chest x-ray with no significant abnormalities noted and EKG was sinus rhythm at 95 bpm.  He has been given some IV Lasix and IV insulin in the ED.  Review of Systems: Reviewed as noted above, otherwise negative.  Past Medical History:  Diagnosis Date  . Anxiety   . Asthma   . CKD stage 3 due to type 2 diabetes mellitus ([place])   . COPD (chronic obstructive pulmonary disease) ([place])   . Coronary artery disease    s/p BMS to [facility] 10/2010;  Cath 01/22/12 prox 30-40% LAD, LCx ramus w/ hazy 70-80% in-stent restenosis, EF 60% treated medically  . Coronary artery disease    s/p BMS to [facility] 10/2010;  Cath 01/22/12 prox 30-40% LAD, LCx ramus w/ hazy 70-80% in-stent restenosis, EF 60% treated medically   . Depression   . Diabetes mellitus    Type 2  . Gastroparesis 05/2013  . GERD (gastroesophageal reflux disease)   . HTN (hypertension)   . Hyperlipidemia   . Hyperlipidemia   . Major depression, chronic 08/13/2012  . Melanoma ([place]) 2007    surgery at [facility], Followed by [place]  . Myocardial infarction ([place]) 2012  . Obesity   . Tobacco abuse   . Tubular adenoma   . Urinary retention     Past Surgical History:  Procedure Laterality Date  . BIOPSY  01/07/2018   Procedure: BIOPSY;  Surgeon: Daneil Dolin, MD;  Location: [facility];  Service: Endoscopy;;  duodenal and gastric biopsy  . BIOPSY  04/25/2018   Procedure: BIOPSY;  Surgeon: Daneil Dolin, MD;  Location: [facility];  Service: Endoscopy;;  ascending and descending and sigmoid biopsies  . CARDIAC CATHETERIZATION     with stent  . COLONOSCOPY N/A 01/27/2013   QQV:ZDGLOV and colonic polyps. Tubular adenomas, poor bowel prep, one-year follow-up surveillance colonoscopy recommended  . COLONOSCOPY WITH PROPOFOL N/A 04/25/2018   Procedure: COLONOSCOPY WITH PROPOFOL;  Surgeon: Daneil Dolin, MD;  Location: [facility];  Service: Endoscopy;  Laterality: N/A;  11:00am - pt to be prepped INPT and labs to be done as well  . CORONARY ANGIOPLASTY WITH STENT PLACEMENT    . ESOPHAGOGASTRODUODENOSCOPY  04/24/2012   [place]k-mild erosive reflux esophagitis,dilated w/71F Venia Minks, small HH, minimal chronic gastric/bulbar erosions(No H pylori)  . ESOPHAGOGASTRODUODENOSCOPY (EGD) WITH PROPOFOL N/A 01/07/2018   Dr. Gala Romney: Erythematous mucosa in the stomach, retained gastric contents, incomplete exam.  Biopsied  showed mild chronic gastritis but no H. pylori.  Duodenal biopsy was negative for celiac disease.  [name] FLEXIBLE SIGMOIDOSCOPY N/A 01/07/2018   Dr. Gala Romney: Incomplete colonoscopy due to inadequate bowel prep  . LEFT HEART CATHETERIZATION WITH CORONARY ANGIOGRAM N/A 01/22/2012   Procedure: LEFT HEART CATHETERIZATION WITH CORONARY ANGIOGRAM;  Surgeon: Jolaine Artist, MD;  Location: [facility] CATH LAB;  Service: Cardiovascular;  Laterality: N/A;  . melanoma surgery  2007   [place], removed lymph nodes under arm as well,Left abd     reports that he has been smoking cigarettes. He started  smoking about 39 years ago. He has a 15.50 [place]-year smoking history. He has never used smokeless tobacco. He reports that he does not drink alcohol and does not use drugs.  No Known Allergies  Family History  Problem Relation Age of Onset  . Stroke Mother   . Alcohol abuse Father   . Heart disease Other   . Other Other        not real familiar with family history  . Lung cancer Other   . Colon cancer Neg Hx   . Colon polyps Neg Hx     Prior to Admission medications   Medication Sig Start Date End Date Taking? Authorizing Provider  albuterol (VENTOLIN HFA) 108 (90 Base) MCG/ACT inhaler INHALE 2 PUFFS BY MOUTH EVERY 6 HRS AS NEEDED 10/06/20  Yes Gottschalk, Ashly M, DO  furosemide (LASIX) 80 MG tablet Take 1 tablet (80 mg total) by mouth 2 (two) times daily. 123456  Yes Delora Fuel, MD  insulin glargine (LANTUS SOLOSTAR) 100 UNIT/ML Solostar Pen Inject 60 Units into the skin every evening. Patient taking differently: Inject 40 Units into the skin every evening. 07/01/20  Yes Gottschalk, Ashly M, DO  acetaminophen (TYLENOL) 325 MG tablet Take 2 tablets (650 mg total) by mouth every 6 (six) hours as needed for mild pain, moderate pain or fever (or Fever >/= 101). Patient not taking: No sig reported 03/21/19   Roxan Hockey, MD  albuterol (PROVENTIL) (2.5 MG/3ML) 0.083% nebulizer solution Take 3 mLs (2.5 mg total) by nebulization every 6 (six) hours as needed for wheezing or shortness of breath. Dx---44.1 Patient not taking: No sig reported 02/16/20   [name], DO  aspirin EC 81 MG tablet Take 1 tablet (81 mg total) by mouth daily with breakfast. Patient not taking: Reported on 10/22/2020 03/21/19   Roxan Hockey, MD  atorvastatin (LIPITOR) 80 MG tablet Take 1 tablet (80 mg total) by mouth daily. 07/01/20   [name], DO  busPIRone (BUSPAR) 7.5 MG tablet Take 1 tablet by mouth twice daily 09/06/20   Ronnie Doss M, DO  cephALEXin (KEFLEX) 250 MG capsule Take 1  capsule (250 mg total) by mouth 4 (four) times daily. Patient not taking: No sig reported 10/05/20   Hayden Rasmussen, MD  ELIQUIS 5 MG TABS tablet Take 1 tablet by mouth twice daily 09/22/20   Strader, [place] M, PA-C  ENTRESTO 24-26 MG Take 1 tablet by mouth 2 (two) times daily. 08/01/20   [provider]  famotidine (PEPCID) 20 MG tablet Take 20 mg by mouth 2 (two) times daily. Patient not taking: No sig reported 12/22/19   [provider]  gabapentin (NEURONTIN) 600 MG tablet Take 1 tablet (600 mg total) by mouth 3 (three) times daily. 10/07/20   [name], DO  lisinopril (ZESTRIL) 10 MG tablet Take 1 tablet (10 mg total) by mouth at bedtime. 07/01/20   Lajuana Ripple,  Ashly M, DO  metFORMIN (GLUCOPHAGE) 500 MG tablet Take 1 tablet (500 mg total) by mouth 2 (two) times daily with a meal. Patient not taking: No sig reported 11/12/19   Cassandria Anger, MD  metoprolol succinate (TOPROL-XL) 25 MG 24 hr tablet Take 12.5 mg by mouth daily. 07/30/20   [provider]  mometasone-formoterol (DULERA) 200-5 MCG/ACT AERO Inhale 2 puffs into the lungs in the morning and at bedtime. 07/20/20   [name], DO  nitroGLYCERIN (NITROSTAT) 0.4 MG SL tablet Place 1 tablet (0.4 mg total) under the tongue every 5 (five) minutes as needed. For chest pain. Patient not taking: No sig reported 08/13/12   Reece Packer, NP  oxyCODONE (OXY IR/ROXICODONE) 5 MG immediate release tablet Take 5 mg by mouth 2 (two) times daily as needed. 09/26/20   [provider]  potassium chloride (KLOR-CON) 10 MEQ tablet Take 10 mEq by mouth 2 (two) times daily. 09/16/20   [provider]  tiZANidine (ZANAFLEX) 4 MG tablet Take 4-8 mg by mouth every 6 (six) hours as needed for muscle spasms. Patient not taking: No sig reported    [provider]  traZODone (DESYREL) 150 MG tablet TAKE 1 TABLET BY MOUTH ONCE DAILY AT BEDTIME Patient taking differently: Take 150 mg  by mouth at bedtime. 08/31/20   [name], DO  metoprolol tartrate (LOPRESSOR) 25 MG tablet Take 25 mg by mouth 2 (two) times daily.    12/27/11  [provider]    Physical Exam: Vitals:   10/22/20 0930 10/22/20 1030 10/22/20 1100 10/22/20 1130  BP: 123/79 (!) 134/101 (!) 131/95 131/88  Pulse: 95 94 91 92  Resp: 13 15  18   Temp:      TempSrc:      SpO2: 98% 100% 97% 100%    Constitutional: NAD, calm, comfortable Vitals:   10/22/20 0930 10/22/20 1030 10/22/20 1100 10/22/20 1130  BP: 123/79 (!) 134/101 (!) 131/95 131/88  Pulse: 95 94 91 92  Resp: 13 15  18   Temp:      TempSrc:      SpO2: 98% 100% 97% 100%   Eyes: lids and conjunctivae normal Neck: normal, supple Respiratory: clear to auscultation bilaterally. Normal respiratory effort. No accessory muscle use.  Currently on 2 L nasal cannula oxygen. Cardiovascular: Regular rate and rhythm, no murmurs. Abdomen: Significant edema noted Musculoskeletal: Significant edema up to mid abdomen 3+ Skin: Skin breakdown on the lower extremities noted. Psychiatric: Flat affect  Labs on Admission: I have personally reviewed following labs and imaging studies  CBC: Recent Labs  Lab 10/22/20 0910  WBC 6.6  NEUTROABS 4.8  HGB 12.1*  HCT 37.9*  MCV 94.0  PLT 123456*   Basic Metabolic Panel: Recent Labs  Lab 10/22/20 0910  NA 128*  K 4.0  CL 88*  CO2 30  GLUCOSE 538*  BUN 16  CREATININE 1.38*  CALCIUM 8.2*   GFR: CrCl cannot be calculated (Unknown ideal weight.). Liver Function Tests: Recent Labs  Lab 10/22/20 0910  AST 32  ALT 33  ALKPHOS 135*  BILITOT 0.8  PROT 6.0*  ALBUMIN 2.0*   No results for input(s): LIPASE, AMYLASE in the last 168 hours. No results for input(s): AMMONIA in the last 168 hours. Coagulation Profile: No results for input(s): INR, PROTIME in the last 168 hours. Cardiac Enzymes: No results for input(s): CKTOTAL, CKMB, CKMBINDEX, TROPONINI in the last 168 hours. BNP (last 3  results) No results for input(s): PROBNP in  the last 8760 hours. HbA1C: No results for input(s): HGBA1C in the last 72 hours. CBG: Recent Labs  Lab 10/22/20 1127  GLUCAP 415*   Lipid Profile: No results for input(s): CHOL, HDL, LDLCALC, TRIG, CHOLHDL, LDLDIRECT in the last 72 hours. Thyroid Function Tests: No results for input(s): TSH, T4TOTAL, FREET4, T3FREE, THYROIDAB in the last 72 hours. Anemia Panel: No results for input(s): VITAMINB12, FOLATE, FERRITIN, TIBC, IRON, RETICCTPCT in the last 72 hours. Urine analysis:    Component Value Date/Time   COLORURINE YELLOW 09/15/2020 2055   APPEARANCEUR CLEAR 09/15/2020 2055   APPEARANCEUR Cloudy (A) 06/10/2019 1549   LABSPEC 1.024 09/15/2020 2055   PHURINE 5.0 09/15/2020 2055   GLUCOSEU >=500 (A) 09/15/2020 2055   HGBUR MODERATE (A) 09/15/2020 2055   BILIRUBINUR NEGATIVE 09/15/2020 2055   BILIRUBINUR Negative 06/10/2019 1549   KETONESUR NEGATIVE 09/15/2020 2055   PROTEINUR >=300 (A) 09/15/2020 2055   UROBILINOGEN 0.2 01/26/2014 1618   NITRITE NEGATIVE 09/15/2020 2055   LEUKOCYTESUR NEGATIVE 09/15/2020 2055    Radiological Exams on Admission: DG Chest Port 1 View  Result Date: 10/22/2020 CLINICAL DATA:  Shortness of breath EXAM: PORTABLE CHEST 1 VIEW COMPARISON:  September 28, 2020 FINDINGS: There is mild atelectatic change in the left lower lobe region. There is no edema or consolidation. Heart is upper normal in size with pulmonary vascularity normal. No adenopathy. No bone lesions. IMPRESSION: Mild left lower lobe atelectatic change. Lungs otherwise clear. Stable cardiac silhouette. Electronically Signed   By: Lowella Grip III M.D.   On: 10/22/2020 10:01    EKG: Independently reviewed.  Sinus rhythm 95 bpm with some PVCs.  Assessment/Plan Active Problems:   Acute on chronic diastolic (congestive) heart failure (HCC)    Significant volume overload with anasarca and acute on chronic diastolic CHF  exacerbation -Secondary to noncompliance with home medications -Last 2D echocardiogram performed on 07/24/2020 with LVEF 60-65% and grade 3 diastolic dysfunction -Continued here on Lasix IV 80 mg twice daily, based on home dose -Hold metoprolol -Continue Entresto -Monitor daily weights and strict I's and O's -Cardiac diet with fluid restriction -Wound care to lower extremities -Need for scrotal elevation  Hyperglycemia in the setting of type 2 diabetes -Noncompliant with home medications -To be given total of 20 units of IV insulin in the ED -Maintain on aggressive SSI and carb modified diet -Recent hemoglobin A1c of 14%  Dyslipidemia -Continue Lipitor  CAD with prior stent placement -Continue on aspirin, Eliquis, and Lipitor -Hold Toprol for now given aggressive diuresis  GERD -Famotidine  COPD -No acute bronchospasms currently noted -Continue Dulera  History of medication noncompliance and leaving AMA   DVT prophylaxis: Eliquis Code Status: Full Family Communication: None at bedside Disposition Plan: Admit for diuresis and control of hyperglycemia Consults called: None Admission status: Inpatient, telemetry  Severity of Illness: The appropriate patient status for this patient is INPATIENT. Inpatient status is judged to be reasonable and necessary in order to provide the required intensity of service to ensure the patient's safety. The patient's presenting symptoms, physical exam findings, and initial radiographic and laboratory data in the context of their chronic comorbidities is felt to place them at high risk for further clinical deterioration. Furthermore, it is not anticipated that the patient will be medically stable for discharge from the hospital within 2 midnights of admission. The following factors support the patient status of inpatient.   " The patient's presenting symptoms include swelling and weakness. " The worrisome physical exam findings include  edema. "  The initial radiographic and laboratory data are worrisome because of hyperglycemia. " The chronic co-morbidities include heart failure and diabetes.   * I certify that at the point of admission it is my clinical judgment that the patient will require inpatient hospital care spanning beyond 2 midnights from the point of admission due to high intensity of service, high risk for further deterioration and high frequency of surveillance required.*    Monesha Monreal D Laytoya Ion DO [facility]  If 7PM-7AM, please contact night-coverage [email or web address]  10/22/2020, 12:04 PM

## 2020-10-22 NOTE — ED Provider Notes (Signed)
[facility] EMERGENCY DEPARTMENT Provider Note   CSN: [id number] Arrival date & time: 10/22/20  0801     History Chief Complaint  Patient presents with  . Leg Swelling    Dustin Cruz is a 55 y.o. male.  Patient has a history of heart failure and diabetes.  He has been running out of his medicine now complains of swelling throughout his legs and abdomen weakness  The history is provided by the patient and medical records. No language interpreter was used.  Weakness Severity:  Moderate Onset quality:  Sudden Timing:  Constant Progression:  Worsening Chronicity:  New Context: not alcohol use   Relieved by:  Nothing Worsened by:  Nothing Ineffective treatments:  None tried Associated symptoms: no abdominal pain, no chest pain, no cough, no diarrhea, no frequency, no headaches and no seizures        Past Medical History:  Diagnosis Date  . Anxiety   . Asthma   . CKD stage 3 due to type 2 diabetes mellitus ([place])   . COPD (chronic obstructive pulmonary disease) ([place])   . Coronary artery disease    s/p BMS to Ramus 10/2010;  Cath 01/22/12 prox 30-40% LAD, LCx ramus w/ hazy 70-80% in-stent restenosis, EF 60% treated medically  . Coronary artery disease    s/p BMS to Ramus 10/2010;  Cath 01/22/12 prox 30-40% LAD, LCx ramus w/ hazy 70-80% in-stent restenosis, EF 60% treated medically   . Depression   . Diabetes mellitus    Type 2  . Gastroparesis 05/2013  . GERD (gastroesophageal reflux disease)   . HTN (hypertension)   . Hyperlipidemia   . Hyperlipidemia   . Major depression, chronic 08/13/2012  . Melanoma ([place]) 2007   surgery at [facility], Followed by [place]  . Myocardial infarction ([place]) 2012  . Obesity   . Tobacco abuse   . Tubular adenoma   . Urinary retention     Patient Active Problem List   Diagnosis Date Noted  . Acute on chronic diastolic (congestive) heart failure ([place]) 10/22/2020  . Acute on chronic combined systolic and diastolic CHF (congestive heart  failure) ([place]) 09/28/2020  . Peripheral edema 09/27/2020  . Acute on chronic systolic (congestive) heart failure ([place]) 09/27/2020  . Scrotum swelling 09/26/2020  . Acute diastolic CHF (congestive heart failure) ([place]) 07/23/2020  . Abnormal weight gain 07/23/2020  . Noncompliance 07/23/2020  . Pelvic lymphadenopathy 07/20/2020  . Acute on chronic diastolic CHF (congestive heart failure) ([place]) 06/30/2020  . CHF (congestive heart failure) ([place]) 06/29/2020  . Pre-ulcerative calluses 10/29/2019  . Musculoskeletal pain 10/13/2019  . Housing problems 05/23/2019  . Essential hypertension, benign 03/20/2019  . 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 Near syncope 03/17/2019  . AKI (acute kidney injury) ([place]) 03/17/2019  . 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 CKD stage 3 due to type 2 diabetes mellitus ([place]) 11/28/2018  . Microalbuminuria due to type 2 diabetes mellitus ([place]) 05/22/2018  . Hyperglycemia due to diabetes mellitus ([place]) 04/24/2018  . Abdominal pain, epigastric 11/29/2017  . Diarrhea 11/29/2017  . History of colonic polyps 11/29/2017  . Diffuse abdominal pain 11/29/2017  . Diabetic retinopathy ([place]) 09/21/2015  . Carotid bruit 09/21/2015  . Viral gastroenteritis 06/03/2015  . Hypertriglyceridemia 05/18/2015  . Vitamin D deficiency 12/29/2013  . Nausea alone 04/24/2013  . Frequency of urination 04/14/2013  . Fever, unspecified 04/14/2013  . Hepatic steatosis 12/28/2012  . Peripheral neuropathy 11/25/2012  . Melanoma of skin, site unspecified 11/25/2012  . Mixed hyperlipidemia   .  Depression   . Hypertension associated with diabetes ([place])   . GERD (gastroesophageal reflux disease) 04/03/2012  . Current smoker 03/14/2012  . Type 2 diabetes mellitus with stage 3a chronic kidney disease, with long-term current use of insulin ([place])   . CAD (coronary artery disease) 01/06/2011    Past Surgical History:  Procedure Laterality Date  . BIOPSY  01/07/2018   Procedure: BIOPSY;  Surgeon: Daneil Dolin, MD;  Location: [facility];  Service: Endoscopy;;  duodenal and gastric biopsy  . BIOPSY  04/25/2018   Procedure: BIOPSY;  Surgeon: Daneil Dolin, MD;  Location: [facility] END[facility];  Service: Endoscopy;;  ascending and descending and sigmoid biopsies  . CARDIAC CATHETERIZATION     with stent  . COLONOSCOPY N/A 01/27/2013   OMA:YOKHTX and colonic polyps. Tubular adenomas, poor bowel prep, one-year follow-up surveillance colonoscopy recommended  . COLONOSCOPY WITH PROPOFOL N/A 04/25/2018   Procedure: COLONOSCOPY WITH PROPOFOL;  Surgeon: Daneil Dolin, MD;  Location: [facility];  Service: Endoscopy;  Laterality: N/A;  11:00am - pt to be prepped INPT and labs to be done as well  . CORONARY ANGIOPLASTY WITH STENT PLACEMENT    . ESOPHAGOGASTRODUODENOSCOPY  04/24/2012   [place]-mild erosive reflux esophagitis,dilated w/53F Venia Minks, small HH, minimal chronic gastric/bulbar erosions(No H pylori)  . ESOPHAGOGASTRODUODENOSCOPY (EGD) WITH PROPOFOL N/A 01/07/2018   Dr. Gala Romney: Erythematous mucosa in the stomach, retained gastric contents, incomplete exam.  Biopsied showed mild chronic gastritis but no H. pylori.  Duodenal biopsy was negative for celiac disease.  [name] FLEXIBLE SIGMOIDOSCOPY N/A 01/07/2018   Dr. Gala Romney: Incomplete colonoscopy due to inadequate bowel prep  . LEFT HEART CATHETERIZATION WITH CORONARY ANGIOGRAM N/A 01/22/2012   Procedure: LEFT HEART CATHETERIZATION WITH CORONARY ANGIOGRAM;  Surgeon: Jolaine Artist, MD;  Location: [facility] CATH LAB;  Service: Cardiovascular;  Laterality: N/A;  . melanoma surgery  2007   [place], removed lymph nodes under arm as well,Left abd       Family History  Problem Relation Age of Onset  . Stroke Mother   . Alcohol abuse Father   . Heart disease Other   . Other Other        not real familiar with family history  . Lung cancer Other   . Colon cancer Neg Hx   . Colon polyps Neg Hx     Social History   Tobacco Use  . Smoking status: Current Every Day  Smoker    Packs/day: 0.50    Years: 31.00    Zheng years: 15.50    Types: Cigarettes    Start date: 10/09/1981  . Smokeless tobacco: Never Used  . Tobacco comment: 1/2 [place] daily 11/01/17  Vaping Use  . Vaping Use: Never used  Substance Use Topics  . 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  . Drug use: No    Home Medications Prior to Admission medications   Medication Sig Start Date End Date Taking? Authorizing Provider  albuterol (VENTOLIN HFA) 108 (90 Base) MCG/ACT inhaler INHALE 2 PUFFS BY MOUTH EVERY 6 HRS AS NEEDED 10/06/20  Yes Ronnie Doss M, DO  aspirin EC 81 MG tablet Take 1 tablet (81 mg total) by mouth daily with breakfast. 03/21/19  Yes Emokpae, Courage, MD  atorvastatin (LIPITOR) 80 MG tablet Take 1 tablet (80 mg total) by mouth daily. 07/01/20  Yes Gottschalk,  Ashly M, DO  ELIQUIS 5 MG TABS tablet Take 1 tablet by mouth twice daily 09/22/20  Yes Strader, [place] M, PA-C  furosemide (LASIX) 80 MG tablet Take 1 tablet (80 mg total) by mouth 2 (two) times daily. 16/3/84  Yes Delora Fuel, MD  gabapentin (NEURONTIN) 600 MG tablet Take 1 tablet (600 mg total) by mouth 3 (three) times daily. 10/07/20  Yes Gottschalk, Ashly M, DO  insulin glargine (LANTUS SOLOSTAR) 100 UNIT/ML Solostar Pen Inject 60 Units into the skin every evening. Patient taking differently: Inject 40 Units into the skin every evening. 07/01/20  Yes Gottschalk, Ashly M, DO  lisinopril (ZESTRIL) 10 MG tablet Take 1 tablet (10 mg total) by mouth at bedtime. 07/01/20  Yes Ronnie Doss M, DO  metoprolol succinate (TOPROL-XL) 25 MG 24 hr tablet Take 12.5 mg by mouth daily. 07/30/20  Yes [provider]  traZODone (DESYREL) 150 MG tablet TAKE 1 TABLET BY MOUTH ONCE DAILY AT BEDTIME Patient taking differently: Take 150 mg by mouth at bedtime. 08/31/20  Yes Ronnie Doss M, DO   acetaminophen (TYLENOL) 325 MG tablet Take 2 tablets (650 mg total) by mouth every 6 (six) hours as needed for mild pain, moderate pain or fever (or Fever >/= 101). Patient not taking: No sig reported 03/21/19   Roxan Hockey, MD  albuterol (PROVENTIL) (2.5 MG/3ML) 0.083% nebulizer solution Take 3 mLs (2.5 mg total) by nebulization every 6 (six) hours as needed for wheezing or shortness of breath. Dx---44.1 Patient not taking: Reported on 10/22/2020 02/16/20   [name], DO  Blood Glucose Monitoring Suppl (ACCU-CHEK GUIDE) w/Device KIT 1 Piece by Does not apply route as directed. 06/30/20   Claretta Fraise, MD  busPIRone (BUSPAR) 7.5 MG tablet Take 1 tablet by mouth twice daily 09/06/20   Ronnie Doss M, DO  cephALEXin (KEFLEX) 250 MG capsule Take 1 capsule (250 mg total) by mouth 4 (four) times daily. 10/05/20   Hayden Rasmussen, MD  Elastic Bandages & Supports (MEDICAL COMPRESSION STOCKINGS) [place] 1 Product by Does not apply route daily as needed (swelling). 10/05/20   Hayden Rasmussen, MD  ENTRESTO 24-26 MG Take 1 tablet by mouth 2 (two) times daily. 08/01/20   [provider]  famotidine (PEPCID) 20 MG tablet Take 20 mg by mouth 2 (two) times daily. Patient not taking: No sig reported 12/22/19   [provider]  glucose blood (ACCU-CHEK GUIDE) test strip Use as instructed 07/01/20   Ronnie Doss M, DO  metFORMIN (GLUCOPHAGE) 500 MG tablet Take 1 tablet (500 mg total) by mouth 2 (two) times daily with a meal. 11/12/19   Nida, Marella Chimes, MD  mometasone-formoterol (DULERA) 200-5 MCG/ACT AERO Inhale 2 puffs into the lungs in the morning and at bedtime. 07/20/20   [name], DO  nitroGLYCERIN (NITROSTAT) 0.4 MG SL tablet Place 1 tablet (0.4 mg total) under the tongue every 5 (five) minutes as needed. For chest pain. 08/13/12   Reece Packer, NP  oxyCODONE (OXY IR/ROXICODONE) 5 MG immediate release tablet Take 5 mg by mouth 2 (two) times daily as  needed. 09/26/20   [provider]  potassium chloride (KLOR-CON) 10 MEQ tablet Take 10 mEq by mouth 2 (two) times daily. 09/16/20   [provider]  tiZANidine (ZANAFLEX) 4 MG tablet Take 4-8 mg by mouth every 6 (six) hours as needed for muscle spasms.    [provider]  metoprolol tartrate (LOPRESSOR) 25 MG tablet Take 25 mg by mouth 2 (  two) times daily.    12/27/11  [provider]    Allergies    Patient has no known allergies.  Review of Systems   Review of Systems  Constitutional: Negative for appetite change and fatigue.  HENT: Negative for congestion, ear discharge and sinus pressure.   Eyes: Negative for discharge.  Respiratory: Negative for cough.   Cardiovascular: Negative for chest pain.  Gastrointestinal: Negative for abdominal pain and diarrhea.  Genitourinary: Negative for frequency and hematuria.  Musculoskeletal: Negative for back pain.       Swelling in legs and groin  Skin: Negative for rash.  Neurological: Positive for weakness. Negative for seizures and headaches.  Psychiatric/Behavioral: Negative for hallucinations.    Physical Exam Updated Vital Signs BP 123/79   Pulse 95   Temp 98.3 F (36.8 C) (Oral)   Resp 13   SpO2 98%   Physical Exam Vitals and nursing note reviewed.  Constitutional:      Appearance: He is well-developed.  HENT:     Head: Normocephalic.     Nose: Nose normal.  Eyes:     General: No scleral icterus.    Extraocular Movements: EOM normal.     Conjunctiva/sclera: Conjunctivae normal.  Neck:     Thyroid: No thyromegaly.  Cardiovascular:     Rate and Rhythm: Normal rate and regular rhythm.     Heart sounds: No murmur heard. No friction rub. No gallop.   Pulmonary:     Breath sounds: No stridor. No wheezing or rales.  Chest:     Chest wall: No tenderness.  Abdominal:     General: There is no distension.     Tenderness: There is no abdominal tenderness. There is no rebound.   Musculoskeletal:        General: No edema. Normal range of motion.     Cervical back: Neck supple.     Comments: 3+ edema all the way up to his umbilicus including his scrotum  Lymphadenopathy:     Cervical: No cervical adenopathy.  Skin:    Findings: No erythema or rash.  Neurological:     Mental Status: He is alert and oriented to person, place, and time.     Motor: No abnormal muscle tone.     Coordination: Coordination normal.  Psychiatric:        Mood and Affect: Mood and affect normal.        Behavior: Behavior normal.     ED Results / Procedures / Treatments   Labs (all labs ordered are listed, but only abnormal results are displayed) Labs Reviewed  CBC WITH DIFFERENTIAL/PLATELET - Abnormal; Notable for the following components:      Result Value   RBC 4.03 (*)    Hemoglobin 12.1 (*)    HCT 37.9 (*)    Platelets 424 (*)    All other components within normal limits  COMPREHENSIVE METABOLIC PANEL - Abnormal; Notable for the following components:   Sodium 128 (*)    Chloride 88 (*)    Glucose, Bld 538 (*)    Creatinine, Ser 1.38 (*)    Calcium 8.2 (*)    Total Protein 6.0 (*)    Albumin 2.0 (*)    Alkaline Phosphatase 135 (*)    All other components within normal limits  BRAIN NATRIURETIC PEPTIDE - Abnormal; Notable for the following components:   B Natriuretic Peptide 1,662.0 (*)    All other components within normal limits  RESP PANEL BY RT-PCR (FLU A&B, COVID)  ARPGX2    EKG EKG Interpretation  Date/Time:  Friday October 22 2020 09:19:28 EST Ventricular Rate:  95 PR Interval:    QRS Duration: 131 QT Interval:  394 QTC Calculation: 496 R Axis:   53 Text Interpretation: Sinus rhythm Ventricular premature complex Nonspecific intraventricular conduction delay Probable anterolateral infarct, age indeterm Confirmed by Milton Ferguson [id number]) on 10/22/2020 10:11:43 AM   Radiology DG Chest Port 1 View  Result Date: 10/22/2020 CLINICAL DATA:  Shortness of  breath EXAM: PORTABLE CHEST 1 VIEW COMPARISON:  September 28, 2020 FINDINGS: There is mild atelectatic change in the left lower lobe region. There is no edema or consolidation. Heart is upper normal in size with pulmonary vascularity normal. No adenopathy. No bone lesions. IMPRESSION: Mild left lower lobe atelectatic change. Lungs otherwise clear. Stable cardiac silhouette. Electronically Signed   By: Lowella Grip III M.D.   On: 10/22/2020 10:01    Procedures Procedures (including critical care time)  Medications Ordered in ED Medications  insulin aspart (novoLOG) injection 10 Units (has no administration in time range)  furosemide (LASIX) injection 40 mg (40 mg Intravenous Given 10/22/20 0922)  insulin aspart (novoLOG) injection 20 Units (20 Units Subcutaneous Given 10/22/20 1032)    ED Course  I have reviewed the triage vital signs and the nursing notes.  Pertinent labs & imaging results that were available during my care of the patient were reviewed by me and considered in my medical decision making (see chart for details). CRITICAL CARE Performed by: Milton Ferguson Total critical care time:40 minutes Critical care time was exclusive of separately billable procedures and treating other patients. Critical care was necessary to treat or prevent imminent or life-threatening deterioration. Critical care was time spent personally by me on the following activities: development of treatment plan with patient and/or surrogate as well as nursing, discussions with consultants, evaluation of patient's response to treatment, examination of patient, obtaining history from patient or surrogate, ordering and performing treatments and interventions, ordering and review of laboratory studies, ordering and review of radiographic studies, pulse oximetry and re-evaluation of patient's condition.    MDM Rules/Calculators/A&P                          Patient with anasarca and poorly controlled diabetes.   He will be admitted to medicine given Lasix and insulin Final Clinical Impression(s) / ED Diagnoses Final diagnoses:  Anasarca    Rx / DC Orders ED Discharge Orders    None       Milton Ferguson, MD 10/22/20 1035

## 2020-10-22 NOTE — ED Triage Notes (Addendum)
Pt reports swelling in lower extremity, abdomen, and scrotum for weeks. Reports he has drainage between his leg. Skin peeling on feet and legsReports he has been out of his fluid medication and insulin for several days. Reports living in his car

## 2020-10-23 ENCOUNTER — Encounter (HOSPITAL_COMMUNITY): Payer: Self-pay | Admitting: Internal Medicine

## 2020-10-23 DIAGNOSIS — I5033 Acute on chronic diastolic (congestive) heart failure: Secondary | ICD-10-CM | POA: Diagnosis not present

## 2020-10-23 LAB — CBC
HCT: 37.4 % — ABNORMAL LOW (ref 39.0–52.0)
Hemoglobin: 11.7 g/dL — ABNORMAL LOW (ref 13.0–17.0)
MCH: 29.7 pg (ref 26.0–34.0)
MCHC: 31.3 g/dL (ref 30.0–36.0)
MCV: 94.9 fL (ref 80.0–100.0)
Platelets: 479 10*3/uL — ABNORMAL HIGH (ref 150–400)
RBC: 3.94 MIL/uL — ABNORMAL LOW (ref 4.22–5.81)
RDW: 15.1 % (ref 11.5–15.5)
WBC: 9.3 10*3/uL (ref 4.0–10.5)
nRBC: 0 % (ref 0.0–0.2)

## 2020-10-23 LAB — COMPREHENSIVE METABOLIC PANEL
ALT: 29 U/L (ref 0–44)
AST: 31 U/L (ref 15–41)
Albumin: 1.7 g/dL — ABNORMAL LOW (ref 3.5–5.0)
Alkaline Phosphatase: 113 U/L (ref 38–126)
Anion gap: 8 (ref 5–15)
BUN: 18 mg/dL (ref 6–20)
CO2: 34 mmol/L — ABNORMAL HIGH (ref 22–32)
Calcium: 8 mg/dL — ABNORMAL LOW (ref 8.9–10.3)
Chloride: 89 mmol/L — ABNORMAL LOW (ref 98–111)
Creatinine, Ser: 1.48 mg/dL — ABNORMAL HIGH (ref 0.61–1.24)
GFR, Estimated: 56 mL/min — ABNORMAL LOW (ref >60)
Glucose, Bld: 285 mg/dL — ABNORMAL HIGH (ref 70–99)
Potassium: 4.3 mmol/L (ref 3.5–5.1)
Sodium: 131 mmol/L — ABNORMAL LOW (ref 135–145)
Total Bilirubin: 0.8 mg/dL (ref 0.3–1.2)
Total Protein: 5.3 g/dL — ABNORMAL LOW (ref 6.5–8.1)

## 2020-10-23 LAB — MAGNESIUM: Magnesium: 1.7 mg/dL (ref 1.7–2.4)

## 2020-10-23 LAB — GLUCOSE, CAPILLARY
Glucose-Capillary: 286 mg/dL — ABNORMAL HIGH (ref 70–99)
Glucose-Capillary: 325 mg/dL — ABNORMAL HIGH (ref 70–99)

## 2020-10-23 MED ORDER — GABAPENTIN 300 MG PO CAPS
300.0000 mg | ORAL_CAPSULE | Freq: Three times a day (TID) | ORAL | Status: DC
  Administered 2020-10-23 – 2020-11-09 (×52): 300 mg via ORAL
  Filled 2020-10-23 (×52): qty 1

## 2020-10-23 NOTE — ED Notes (Signed)
Report given to unit 300.  Receiving nurse states patient can be transported to unit.

## 2020-10-23 NOTE — Progress Notes (Signed)
PROGRESS NOTE    Dustin Cruz  [id number] DOB: 09-Dec-1965 DOA: 10/22/2020 PCP: [name], DO   Brief Narrative:  Per HPI: Dustin Cruz is a 55 y.o. male with medical history significant for CAD, type 2 diabetes, diastolic heart failure, GERD, and COPD who presented to the ED with worsening lower extremity edema as well as abdominal edema and scrotal edema as well as some weakness.  He is currently homeless and lives in his car.  He states that he had run out of his Lasix as well as his diabetes medication for the last few days.  It appears that on last admission on 09/2020 that he left AMA as he was being treated for edema at that time.  He denies any fever, chills, abdominal pain, nausea, vomiting, chest pain, or other concerns.   Assessment & Plan:   Active Problems:   Acute on chronic diastolic (congestive) heart failure (HCC)  Significant volume overload with anasarca and acute on chronic diastolic CHF exacerbation -Secondary to noncompliance with home medications -Last 2D echocardiogram performed on 07/24/2020 with LVEF 60-65% and grade 3 diastolic dysfunction -Continued here on Lasix IV 80 mg twice daily, based on home dose -Hold metoprolol -Hold Entresto -Monitor daily weights and strict I's and O's -Cardiac diet with fluid restriction -Wound care to lower extremities -Need for scrotal elevation -Repeat AM labs  Hyperglycemia in the setting of type 2 diabetes-improved -Noncompliant with home medications -Maintain on aggressive SSI and carb modified diet -Recent hemoglobin A1c of 14%  Dyslipidemia -Continue Lipitor  CAD with prior stent placement -Continue on aspirin, Eliquis, and Lipitor -Hold Toprol for now given aggressive diuresis  GERD -Famotidine  COPD -No acute bronchospasms currently noted -Continue Dulera  History of medication noncompliance and leaving AMA   DVT prophylaxis:Eliquis Code Status: Full Family Communication:None at  bedside  Disposition Plan:  Status is: Inpatient  Remains inpatient appropriate because:IV treatments appropriate due to intensity of illness or inability to take PO and Inpatient level of care appropriate due to severity of illness   Dispo: The patient is from: Home              Anticipated d/c is to: Home              Anticipated d/c date is: 3 days              Patient currently is not medically stable to d/c. Pt requires ongoing IV diuresis.   Consultants:   None  Procedures:   See below  Antimicrobials:   None   Subjective: Patient seen and evaluated today with no new acute complaints or concerns. No acute concerns or events noted overnight. Still with ongoing volume overload and scrotal edema.  Objective: Vitals:   10/22/20 2200 10/23/20 0124 10/23/20 0700 10/23/20 0756  BP: 118/79 122/61    Pulse: 94 96    Resp: 15 20    Temp:  98 F (36.7 C)    TempSrc:      SpO2: 97% 98%  94%  Weight:   116.8 kg   Height:   6\' 2"  (1.88 m)     Intake/Output Summary (Last 24 hours) at 10/23/2020 1149 Last data filed at 10/23/2020 0944 Gross per 24 hour  Intake 3 ml  Output 1800 ml  Net -1797 ml   Filed Weights   10/23/20 0700  Weight: 116.8 kg    Examination:  General exam: Appears calm and comfortable  Respiratory system: Clear to auscultation.  Respiratory effort normal. Cardiovascular system: S1 & S2 heard, RRR.  Gastrointestinal system: Abdomen with edema Central nervous system: Alert and awake Extremities: Ongoing 2+ pitting edema bilaterally; scrotal edema Skin: Superficial LE wounds noted bilaterally Psychiatry: Flat affect.    Data Reviewed: I have personally reviewed following labs and imaging studies  CBC: Recent Labs  Lab 10/22/20 0910 10/23/20 0610  WBC 6.6 9.3  NEUTROABS 4.8  --   HGB 12.1* 11.7*  HCT 37.9* 37.4*  MCV 94.0 94.9  PLT 424* 387*   Basic Metabolic Panel: Recent Labs  Lab 10/22/20 0910 10/23/20 0610  NA 128* 131*  K  4.0 4.3  CL 88* 89*  CO2 30 34*  GLUCOSE 538* 285*  BUN 16 18  CREATININE 1.38* 1.48*  CALCIUM 8.2* 8.0*  MG  --  1.7   GFR: Estimated Creatinine Clearance: 76.6 mL/min (A) (by C-G formula based on SCr of 1.48 mg/dL (H)). Liver Function Tests: Recent Labs  Lab 10/22/20 0910 10/23/20 0610  AST 32 31  ALT 33 29  ALKPHOS 135* 113  BILITOT 0.8 0.8  PROT 6.0* 5.3*  ALBUMIN 2.0* 1.7*   No results for input(s): LIPASE, AMYLASE in the last 168 hours. No results for input(s): AMMONIA in the last 168 hours. Coagulation Profile: No results for input(s): INR, PROTIME in the last 168 hours. Cardiac Enzymes: No results for input(s): CKTOTAL, CKMB, CKMBINDEX, TROPONINI in the last 168 hours. BNP (last 3 results) No results for input(s): PROBNP in the last 8760 hours. HbA1C: No results for input(s): HGBA1C in the last 72 hours. CBG: Recent Labs  Lab 10/22/20 1127 10/22/20 1317 10/22/20 1735 10/22/20 2118 10/23/20 1135  GLUCAP 415* 206* 100* 118* 286*   Lipid Profile: No results for input(s): CHOL, HDL, LDLCALC, TRIG, CHOLHDL, LDLDIRECT in the last 72 hours. Thyroid Function Tests: No results for input(s): TSH, T4TOTAL, FREET4, T3FREE, THYROIDAB in the last 72 hours. Anemia Panel: No results for input(s): VITAMINB12, FOLATE, FERRITIN, TIBC, IRON, RETICCTPCT in the last 72 hours. Sepsis Labs: No results for input(s): PROCALCITON, LATICACIDVEN in the last 168 hours.  Recent Results (from the past 240 hour(s))  Resp Panel by RT-PCR (Flu A&B, Covid) Nasopharyngeal Swab     Status: None   Collection Time: 10/22/20 10:42 AM   Specimen: Nasopharyngeal Swab; Nasopharyngeal(NP) swabs in vial transport medium  Result Value Ref Range Status   SARS Coronavirus 2 by RT PCR NEGATIVE NEGATIVE Final    Comment: (NOTE) SARS-CoV-2 target nucleic acids are NOT DETECTED.  The SARS-CoV-2 RNA is generally detectable in upper respiratory specimens during the acute phase of infection. The  lowest concentration of SARS-CoV-2 viral copies this assay can detect is 138 copies/mL. A negative result does not preclude SARS-Cov-2 infection and should not be used as the sole basis for treatment or other patient management decisions. A negative result may occur with  improper specimen collection/handling, submission of specimen other than nasopharyngeal swab, presence of viral mutation(s) within the areas targeted by this assay, and inadequate number of viral copies(<138 copies/mL). A negative result must be combined with clinical observations, patient history, and epidemiological information. The expected result is Negative.  Fact Sheet for Patients:  EntrepreneurPulse.com.au  Fact Sheet for Healthcare Providers:  IncredibleEmployment.be  This test is no t yet approved or cleared by the [place] FDA and  has been authorized for detection and/or diagnosis of SARS-CoV-2 by FDA under an Emergency Use Authorization (EUA). This EUA will remain  in effect (meaning this test can  be used) for the duration of the COVID-19 declaration under Section 564(b)(1) of the Act, 21 U.S.C.section 360bbb-3(b)(1), unless the authorization is terminated  or revoked sooner.       Influenza A by PCR NEGATIVE NEGATIVE Final   Influenza B by PCR NEGATIVE NEGATIVE Final    Comment: (NOTE) The Xpert Xpress SARS-CoV-2/FLU/RSV plus assay is intended as an aid in the diagnosis of influenza from Nasopharyngeal swab specimens and should not be used as a sole basis for treatment. Nasal washings and aspirates are unacceptable for Xpert Xpress SARS-CoV-2/FLU/RSV testing.  Fact Sheet for Patients: EntrepreneurPulse.com.au  Fact Sheet for Healthcare Providers: IncredibleEmployment.be  This test is not yet approved or cleared by the [place] FDA and has been authorized for detection and/or diagnosis of SARS-CoV-2 by FDA under  an Emergency Use Authorization (EUA). This EUA will remain in effect (meaning this test can be used) for the duration of the COVID-19 declaration under Section 564(b)(1) of the Act, 21 U.S.C. section 360bbb-3(b)(1), unless the authorization is terminated or revoked.  Performed at [facility], [address], [place], [place] [address]          Radiology Studies: [facility] Chest [facility] 1 View  Result Date: 10/22/2020 CLINICAL DATA:  Shortness of breath EXAM: PORTABLE CHEST 1 VIEW COMPARISON:  September 28, 2020 FINDINGS: There is mild atelectatic change in the left lower lobe region. There is no edema or consolidation. Heart is upper normal in size with pulmonary vascularity normal. No adenopathy. No bone lesions. IMPRESSION: Mild left lower lobe atelectatic change. Lungs otherwise clear. Stable cardiac silhouette. Electronically Signed   By: Lowella Grip III M.D.   On: 10/22/2020 10:01        Scheduled Meds: . apixaban  5 mg Oral BID  . aspirin EC  81 mg Oral Q breakfast  . atorvastatin  80 mg Oral Daily  . busPIRone  7.5 mg Oral BID  . famotidine  20 mg Oral BID  . furosemide  80 mg Intravenous Q12H  . gabapentin  300 mg Oral TID  . insulin aspart  0-20 Units Subcutaneous TID WC  . insulin aspart  0-5 Units Subcutaneous QHS  . insulin aspart  4 Units Subcutaneous TID WC  . insulin glargine  40 Units Subcutaneous QPM  . mometasone-formoterol  2 puff Inhalation BID  . potassium chloride  10 mEq Oral BID  . sodium chloride flush  3 mL Intravenous Q12H  . traZODone  150 mg Oral QHS   Continuous Infusions: . sodium chloride       LOS: 1 day    Time spent: 35 minutes    Alsie Younes Darleen Crocker, DO [facility]  If 7PM-7AM, please contact night-coverage [email or web address] 10/23/2020, 11:49 AM

## 2020-10-24 DIAGNOSIS — I5033 Acute on chronic diastolic (congestive) heart failure: Secondary | ICD-10-CM | POA: Diagnosis not present

## 2020-10-24 LAB — BASIC METABOLIC PANEL
Anion gap: 9 (ref 5–15)
BUN: 24 mg/dL — ABNORMAL HIGH (ref 6–20)
CO2: 32 mmol/L (ref 22–32)
Calcium: 8 mg/dL — ABNORMAL LOW (ref 8.9–10.3)
Chloride: 92 mmol/L — ABNORMAL LOW (ref 98–111)
Creatinine, Ser: 1.29 mg/dL — ABNORMAL HIGH (ref 0.61–1.24)
GFR, Estimated: >60 mL/min (ref >60)
Glucose, Bld: 63 mg/dL — ABNORMAL LOW (ref 70–99)
Potassium: 3.5 mmol/L (ref 3.5–5.1)
Sodium: 133 mmol/L — ABNORMAL LOW (ref 135–145)

## 2020-10-24 LAB — GLUCOSE, CAPILLARY
Glucose-Capillary: 80 mg/dL (ref 70–99)
Glucose-Capillary: 118 mg/dL — ABNORMAL HIGH (ref 70–99)
Glucose-Capillary: 209 mg/dL — ABNORMAL HIGH (ref 70–99)
Glucose-Capillary: 139 mg/dL — ABNORMAL HIGH (ref 70–99)

## 2020-10-24 LAB — CBC
HCT: 36 % — ABNORMAL LOW (ref 39.0–52.0)
Hemoglobin: 11.4 g/dL — ABNORMAL LOW (ref 13.0–17.0)
MCH: 29.5 pg (ref 26.0–34.0)
MCHC: 31.7 g/dL (ref 30.0–36.0)
MCV: 93.3 fL (ref 80.0–100.0)
Platelets: 482 10*3/uL — ABNORMAL HIGH (ref 150–400)
RBC: 3.86 MIL/uL — ABNORMAL LOW (ref 4.22–5.81)
RDW: 15.2 % (ref 11.5–15.5)
WBC: 9 10*3/uL (ref 4.0–10.5)
nRBC: 0 % (ref 0.0–0.2)

## 2020-10-24 LAB — MAGNESIUM: Magnesium: 1.9 mg/dL (ref 1.7–2.4)

## 2020-10-24 MED ORDER — FUROSEMIDE 10 MG/ML IJ SOLN
80.0000 mg | Freq: Three times a day (TID) | INTRAMUSCULAR | Status: DC
  Administered 2020-10-24 – 2020-10-26 (×6): 80 mg via INTRAVENOUS
  Filled 2020-10-24 (×6): qty 8

## 2020-10-24 MED ORDER — OXYCODONE HCL 5 MG PO TABS
5.0000 mg | ORAL_TABLET | Freq: Two times a day (BID) | ORAL | Status: DC | PRN
  Administered 2020-10-25 – 2020-11-18 (×29): 5 mg via ORAL
  Filled 2020-10-24 (×32): qty 1

## 2020-10-24 NOTE — Progress Notes (Signed)
PROGRESS NOTE    GARL SPEIGNER  BDZ:[id number] DOB: 1965-11-01 DOA: 10/22/2020 PCP: [name], DO   Brief Narrative:  Per HPI: Dustin Cruz a 55 y.o.malewith medical history significant forCAD, type 2 diabetes, diastolic heart failure, GERD, and COPD who presented to the ED with worsening lower extremity edema as well as abdominal edema and scrotal edema as well as some weakness. He is currently homeless and lives in his car. He states that he had run out of his Lasix as well as his diabetes medication for the last few days. It appears that on last admission on 09/2020 that he left AMA as he was being treated for edema at that time. He denies any fever, chills, abdominal pain, nausea, vomiting, chest pain, or other concerns.   Assessment & Plan:   Active Problems:   Acute on chronic diastolic (congestive) heart failure (HCC)   Significant volume overload with anasarca and acute on chronic diastolic CHF exacerbation -Secondary to noncompliance with home medications -Last 2D echocardiogram performed on10/16/2021 with LVEF 60-65% and grade 3 diastolic dysfunction -Continued here on Lasix IV 80 mg now three times daily, for goal diuresis of 2-3L daily -Hold metoprolol -Hold Entresto -Monitor daily weights and strict I's and O's -Cardiac diet with fluid restriction -Wound care to lower extremities -Need for scrotal elevation -Repeat AM labs  Hyperglycemia in the setting of type 2 diabetes-improved -Noncompliant with home medications -Maintain on aggressive SSI and carb modified diet -Recent hemoglobin A1c of 14%  Dyslipidemia -Continue Lipitor  CAD with prior stent placement -Continue on aspirin, Eliquis, and Lipitor -Hold Toprol for now given aggressive diuresis  GERD -Famotidine  COPD -No acute bronchospasms currently noted -Continue Dulera  History of medication noncompliance and leaving AMA   DVT prophylaxis:Eliquis Code Status:  Full Family Communication:None at bedside  Disposition Plan:  Status is: Inpatient  Remains inpatient appropriate because:IV treatments appropriate due to intensity of illness or inability to take PO and Inpatient level of care appropriate due to severity of illness   Dispo: The patient is from: Home  Anticipated d/c is to: Home  Anticipated d/c date is: 2-3 days  Patient currently is not medically stable to d/c. Pt requires ongoing IV diuresis.   Consultants:   None  Procedures:   See below  Antimicrobials:   None   Subjective: Patient seen and evaluated today with no new acute complaints or concerns. No acute concerns or events noted overnight.  Continues to diurese well, but remains significantly volume overloaded.  Objective: Vitals:   10/23/20 2141 10/24/20 0529 10/24/20 0821 10/24/20 1321  BP:  123/71  117/75  Pulse:  98  94  Resp:  18  20  Temp:  98.7 F (37.1 C)  97.9 F (36.6 C)  TempSrc:    Oral  SpO2: 97% 97% 95% 98%  Weight:      Height:        Intake/Output Summary (Last 24 hours) at 10/24/2020 1417 Last data filed at 10/23/2020 2034 Gross per 24 hour  Intake 677 ml  Output 800 ml  Net -123 ml   Filed Weights   10/23/20 0700  Weight: 116.8 kg    Examination:  General exam: Appears calm and comfortable  Respiratory system: Clear to auscultation. Respiratory effort normal. Currently on Room air Cardiovascular system: S1 & S2 heard, RRR.  Gastrointestinal system: Abdomen with edema to lower half Central nervous system: Alert and awake Extremities: bilateral 2+ edema Skin: No significant lesions noted Psychiatry:  Flat affect.    Data Reviewed: I have personally reviewed following labs and imaging studies  CBC: Recent Labs  Lab 10/22/20 0910 10/23/20 0610 10/24/20 0500  WBC 6.6 9.3 9.0  NEUTROABS 4.8  --   --   HGB 12.1* 11.7* 11.4*  HCT 37.9* 37.4* 36.0*  MCV 94.0 94.9 93.3  PLT 424*  479* 123XX123*   Basic Metabolic Panel: Recent Labs  Lab 10/22/20 0910 10/23/20 0610 10/24/20 0500  NA 128* 131* 133*  K 4.0 4.3 3.5  CL 88* 89* 92*  CO2 30 34* 32  GLUCOSE 538* 285* 63*  BUN 16 18 24*  CREATININE 1.38* 1.48* 1.29*  CALCIUM 8.2* 8.0* 8.0*  MG  --  1.7 1.9   GFR: Estimated Creatinine Clearance: 87.9 mL/min (A) (by C-G formula based on SCr of 1.29 mg/dL (H)). Liver Function Tests: Recent Labs  Lab 10/22/20 0910 10/23/20 0610  AST 32 31  ALT 33 29  ALKPHOS 135* 113  BILITOT 0.8 0.8  PROT 6.0* 5.3*  ALBUMIN 2.0* 1.7*   No results for input(s): LIPASE, AMYLASE in the last 168 hours. No results for input(s): AMMONIA in the last 168 hours. Coagulation Profile: No results for input(s): INR, PROTIME in the last 168 hours. Cardiac Enzymes: No results for input(s): CKTOTAL, CKMB, CKMBINDEX, TROPONINI in the last 168 hours. BNP (last 3 results) No results for input(s): PROBNP in the last 8760 hours. HbA1C: No results for input(s): HGBA1C in the last 72 hours. CBG: Recent Labs  Lab 10/22/20 2118 10/23/20 1135 10/23/20 1609 10/24/20 0752 10/24/20 1056  GLUCAP 118* 286* 325* 80 118*   Lipid Profile: No results for input(s): CHOL, HDL, LDLCALC, TRIG, CHOLHDL, LDLDIRECT in the last 72 hours. Thyroid Function Tests: No results for input(s): TSH, T4TOTAL, FREET4, T3FREE, THYROIDAB in the last 72 hours. Anemia Panel: No results for input(s): VITAMINB12, FOLATE, FERRITIN, TIBC, IRON, RETICCTPCT in the last 72 hours. Sepsis Labs: No results for input(s): PROCALCITON, LATICACIDVEN in the last 168 hours.  Recent Results (from the past 240 hour(s))  Resp Panel by RT-PCR (Flu A&B, Covid) Nasopharyngeal Swab     Status: None   Collection Time: 10/22/20 10:42 AM   Specimen: Nasopharyngeal Swab; Nasopharyngeal(NP) swabs in vial transport medium  Result Value Ref Range Status   SARS Coronavirus 2 by RT PCR NEGATIVE NEGATIVE Final    Comment: (NOTE) SARS-CoV-2 target  nucleic acids are NOT DETECTED.  The SARS-CoV-2 RNA is generally detectable in upper respiratory specimens during the acute phase of infection. The lowest concentration of SARS-CoV-2 viral copies this assay can detect is 138 copies/mL. A negative result does not preclude SARS-Cov-2 infection and should not be used as the sole basis for treatment or other patient management decisions. A negative result may occur with  improper specimen collection/handling, submission of specimen other than nasopharyngeal swab, presence of viral mutation(s) within the areas targeted by this assay, and inadequate number of viral copies(<138 copies/mL). A negative result must be combined with clinical observations, patient history, and epidemiological information. The expected result is Negative.  Fact Sheet for Patients:  EntrepreneurPulse.com.au  Fact Sheet for Healthcare Providers:  IncredibleEmployment.be  This test is no t yet approved or cleared by the [place] FDA and  has been authorized for detection and/or diagnosis of SARS-CoV-2 by FDA under an Emergency Use Authorization (EUA). This EUA will remain  in effect (meaning this test can be used) for the duration of the COVID-19 declaration under Section 564(b)(1) of the Act, 21  U.S.C.section 360bbb-3(b)(1), unless the authorization is terminated  or revoked sooner.       Influenza A by PCR NEGATIVE NEGATIVE Final   Influenza B by PCR NEGATIVE NEGATIVE Final    Comment: (NOTE) The Xpert Xpress SARS-CoV-2/FLU/RSV plus assay is intended as an aid in the diagnosis of influenza from Nasopharyngeal swab specimens and should not be used as a sole basis for treatment. Nasal washings and aspirates are unacceptable for Xpert Xpress SARS-CoV-2/FLU/RSV testing.  Fact Sheet for Patients: EntrepreneurPulse.com.au  Fact Sheet for Healthcare  Providers: IncredibleEmployment.be  This test is not yet approved or cleared by the [place] FDA and has been authorized for detection and/or diagnosis of SARS-CoV-2 by FDA under an Emergency Use Authorization (EUA). This EUA will remain in effect (meaning this test can be used) for the duration of the COVID-19 declaration under Section 564(b)(1) of the Act, 21 U.S.C. section 360bbb-3(b)(1), unless the authorization is terminated or revoked.  Performed at [facility], [address], [place], [place] [address]          Radiology Studies: No results found.      Scheduled Meds: . apixaban  5 mg Oral BID  . aspirin EC  81 mg Oral Q breakfast  . atorvastatin  80 mg Oral Daily  . busPIRone  7.5 mg Oral BID  . famotidine  20 mg Oral BID  . furosemide  80 mg Intravenous Q8H  . gabapentin  300 mg Oral TID  . insulin aspart  0-20 Units Subcutaneous TID WC  . insulin aspart  0-5 Units Subcutaneous QHS  . insulin aspart  4 Units Subcutaneous TID WC  . insulin glargine  40 Units Subcutaneous QPM  . mometasone-formoterol  2 puff Inhalation BID  . potassium chloride  10 mEq Oral BID  . sodium chloride flush  3 mL Intravenous Q12H  . traZODone  150 mg Oral QHS   Continuous Infusions: . sodium chloride       LOS: 2 days    Time spent: 35 minutes    Sidrah Harden Darleen Crocker, DO [facility]  If 7PM-7AM, please contact night-coverage [email or web address] 10/24/2020, 2:17 PM

## 2020-10-25 ENCOUNTER — Encounter (HOSPITAL_COMMUNITY): Payer: Self-pay | Admitting: Internal Medicine

## 2020-10-25 DIAGNOSIS — I5033 Acute on chronic diastolic (congestive) heart failure: Secondary | ICD-10-CM | POA: Diagnosis not present

## 2020-10-25 LAB — BASIC METABOLIC PANEL
Anion gap: 9 (ref 5–15)
BUN: 28 mg/dL — ABNORMAL HIGH (ref 6–20)
CO2: 33 mmol/L — ABNORMAL HIGH (ref 22–32)
Calcium: 8 mg/dL — ABNORMAL LOW (ref 8.9–10.3)
Chloride: 92 mmol/L — ABNORMAL LOW (ref 98–111)
Creatinine, Ser: 1.34 mg/dL — ABNORMAL HIGH (ref 0.61–1.24)
GFR, Estimated: >60 mL/min (ref >60)
Glucose, Bld: 38 mg/dL — CL (ref 70–99)
Potassium: 3.8 mmol/L (ref 3.5–5.1)
Sodium: 134 mmol/L — ABNORMAL LOW (ref 135–145)

## 2020-10-25 LAB — BLOOD GAS, ARTERIAL
Acid-Base Excess: 10.3 mmol/L — ABNORMAL HIGH (ref 0.0–2.0)
Bicarbonate: 33.3 mmol/L — ABNORMAL HIGH (ref 20.0–28.0)
FIO2: 21
O2 Saturation: 93.4 %
Patient temperature: 36.3
pCO2 arterial: 51.1 mmHg — ABNORMAL HIGH (ref 32.0–48.0)
pH, Arterial: 7.446 (ref 7.350–7.450)
pO2, Arterial: 68.3 mmHg — ABNORMAL LOW (ref 83.0–108.0)

## 2020-10-25 LAB — GLUCOSE, CAPILLARY
Glucose-Capillary: 115 mg/dL — ABNORMAL HIGH (ref 70–99)
Glucose-Capillary: 181 mg/dL — ABNORMAL HIGH (ref 70–99)

## 2020-10-25 LAB — MAGNESIUM: Magnesium: 2 mg/dL (ref 1.7–2.4)

## 2020-10-25 LAB — AMMONIA: Ammonia: 25 umol/L (ref 9–35)

## 2020-10-25 MED ORDER — INSULIN ASPART 100 UNIT/ML ~~LOC~~ SOLN
0.0000 [IU] | Freq: Every day | SUBCUTANEOUS | Status: DC
  Administered 2020-10-25 – 2020-10-27 (×2): 3 [IU] via SUBCUTANEOUS
  Administered 2020-10-28: 2 [IU] via SUBCUTANEOUS
  Administered 2020-10-29 – 2020-10-30 (×2): 3 [IU] via SUBCUTANEOUS
  Administered 2020-10-31 – 2020-11-01 (×2): 2 [IU] via SUBCUTANEOUS
  Administered 2020-11-05: 3 [IU] via SUBCUTANEOUS
  Administered 2020-11-06 – 2020-11-08 (×2): 2 [IU] via SUBCUTANEOUS
  Administered 2020-11-09: 4 [IU] via SUBCUTANEOUS
  Administered 2020-11-10: 2 [IU] via SUBCUTANEOUS
  Administered 2020-11-12 – 2020-11-17 (×3): 3 [IU] via SUBCUTANEOUS

## 2020-10-25 MED ORDER — INSULIN GLARGINE 100 UNIT/ML ~~LOC~~ SOLN
20.0000 [IU] | Freq: Every evening | SUBCUTANEOUS | Status: DC
  Administered 2020-10-25 – 2020-10-26 (×2): 20 [IU] via SUBCUTANEOUS
  Filled 2020-10-25 (×2): qty 0.2

## 2020-10-25 MED ORDER — DEXTROSE 50 % IV SOLN: 25.0000 g | INTRAVENOUS | Status: AC

## 2020-10-25 MED ORDER — INSULIN ASPART 100 UNIT/ML ~~LOC~~ SOLN
0.0000 [IU] | Freq: Three times a day (TID) | SUBCUTANEOUS | Status: DC
  Administered 2020-10-25: 2 [IU] via SUBCUTANEOUS
  Administered 2020-10-25: 5 [IU] via SUBCUTANEOUS
  Administered 2020-10-26: 3 [IU] via SUBCUTANEOUS
  Administered 2020-10-26: 5 [IU] via SUBCUTANEOUS
  Administered 2020-10-27 – 2020-10-28 (×3): 3 [IU] via SUBCUTANEOUS
  Administered 2020-10-28: 5 [IU] via SUBCUTANEOUS
  Administered 2020-10-28: 2 [IU] via SUBCUTANEOUS
  Administered 2020-10-29: 5 [IU] via SUBCUTANEOUS
  Administered 2020-10-29 – 2020-10-31 (×4): 3 [IU] via SUBCUTANEOUS
  Administered 2020-10-31 (×2): 2 [IU] via SUBCUTANEOUS
  Administered 2020-11-01: 5 [IU] via SUBCUTANEOUS
  Administered 2020-11-01: 3 [IU] via SUBCUTANEOUS
  Administered 2020-11-01: 1 [IU] via SUBCUTANEOUS
  Administered 2020-11-02: 7 [IU] via SUBCUTANEOUS
  Administered 2020-11-02: 2 [IU] via SUBCUTANEOUS
  Administered 2020-11-02: 5 [IU] via SUBCUTANEOUS
  Administered 2020-11-03: 2 [IU] via SUBCUTANEOUS
  Administered 2020-11-03: 5 [IU] via SUBCUTANEOUS
  Administered 2020-11-04: 2 [IU] via SUBCUTANEOUS
  Administered 2020-11-04: 1 [IU] via SUBCUTANEOUS
  Administered 2020-11-05 – 2020-11-06 (×3): 2 [IU] via SUBCUTANEOUS
  Administered 2020-11-06: 5 [IU] via SUBCUTANEOUS
  Administered 2020-11-06: 1 [IU] via SUBCUTANEOUS
  Administered 2020-11-07: 2 [IU] via SUBCUTANEOUS
  Administered 2020-11-07: 7 [IU] via SUBCUTANEOUS
  Administered 2020-11-07: 5 [IU] via SUBCUTANEOUS
  Administered 2020-11-09: 2 [IU] via SUBCUTANEOUS
  Administered 2020-11-09: 1 [IU] via SUBCUTANEOUS
  Administered 2020-11-09: 3 [IU] via SUBCUTANEOUS
  Administered 2020-11-10: 5 [IU] via SUBCUTANEOUS
  Administered 2020-11-10: 3 [IU] via SUBCUTANEOUS
  Administered 2020-11-11: 5 [IU] via SUBCUTANEOUS
  Administered 2020-11-11: 2 [IU] via SUBCUTANEOUS
  Administered 2020-11-11: 3 [IU] via SUBCUTANEOUS
  Administered 2020-11-12 – 2020-11-13 (×5): 2 [IU] via SUBCUTANEOUS
  Administered 2020-11-14: 1 [IU] via SUBCUTANEOUS
  Administered 2020-11-14: 3 [IU] via SUBCUTANEOUS
  Administered 2020-11-15: 1 [IU] via SUBCUTANEOUS
  Administered 2020-11-16 (×2): 3 [IU] via SUBCUTANEOUS
  Administered 2020-11-16: 5 [IU] via SUBCUTANEOUS
  Administered 2020-11-17: 3 [IU] via SUBCUTANEOUS
  Administered 2020-11-17: 1 [IU] via SUBCUTANEOUS
  Administered 2020-11-17: 3 [IU] via SUBCUTANEOUS
  Administered 2020-11-18 (×2): 2 [IU] via SUBCUTANEOUS

## 2020-10-25 MED ORDER — DEXTROSE 50 % IV SOLN
INTRAVENOUS | Status: AC
  Administered 2020-10-25: 50 mL
  Filled 2020-10-25: qty 50

## 2020-10-25 NOTE — Progress Notes (Signed)
CRITICAL VALUE ALERT  Critical Value:  Blood sugar 38  Date & Time Notied: 10/25/20 0720  Provider Notified: Dr. Manuella Ghazi  Orders Received/Actions taken: yes

## 2020-10-25 NOTE — Care Management Note (Signed)
RE: Dustin Cruz DOB: 06/10/1986 Date: 10/25/2020 MUST ID: [id number]  To Whom It May Concern:  Please be advised that the above name patient will require a short-term nursing home stay--anticipated 30 days or less rehabilitation and strengthening. The plan is for return home.

## 2020-10-25 NOTE — Progress Notes (Addendum)
PROGRESS NOTE    Dustin Cruz  [id number] DOB: [birth year] DOA: 10/22/2020 PCP: [name], DO   Brief Narrative:  Per HPI: Dustin Cruz a 55 y.o.malewith medical history significant forCAD, type 2 diabetes, diastolic heart failure, GERD, and COPD who presented to the ED with worsening lower extremity edema as well as abdominal edema and scrotal edema as well as some weakness. He is currently homeless and lives in his car. He states that he had run out of his Lasix as well as his diabetes medication for the last few days. It appears that on last admission on 09/2020 that he left AMA as he was being treated for edema at that time. He denies any fever, chills, abdominal pain, nausea, vomiting, chest pain, or other concerns.  Assessment & Plan:   Active Problems:   Acute on chronic diastolic (congestive) heart failure (HCC)   Significant volume overload with anasarca and acute on chronic diastolic CHF exacerbation-improving -Secondary to noncompliance with home medications -Last 2D echocardiogram performed on10/16/2021 with LVEF 60-65% and grade 3 diastolic dysfunction -Continued here on Lasix IV 80 mg now three times daily, for goal diuresis of 2-3L daily -Hold metoprolol -HoldEntresto -Monitor daily weights and strict I's and O's -Cardiac diet with fluid restriction -Wound care to lower extremities -Need for scrotal elevation -PT evaluation -Repeat AM labs  Hyperglycemia in the setting of type 2 diabetes-improved -Noncompliant with home medications -Currently with some hypoglycemia; will plan to cut long-acting medication dose in half -Decrease SSI and maintain on carb modified diet -Recent hemoglobin A1c of 14%  Dyslipidemia -Continue Lipitor  CAD with prior stent placement -Continue on aspirin, Eliquis, and Lipitor -Hold Toprol for now given aggressive diuresis  GERD -Famotidine  COPD -No acute bronchospasms currently noted -Continue  Dulera  History of medication noncompliance and leaving AMA -PT/TOC evaluation for needs on dc  Protein calorie malnutrition -Dietitian consultation   DVT prophylaxis:Eliquis Code Status:Full Family Communication: Tried calling Ms. Burnette on 1/17 with no response Disposition Plan: Status is: Inpatient  Remains inpatient appropriate because:IV treatments appropriate due to intensity of illness or inability to take PO and Inpatient level of care appropriate due to severity of illness   Dispo: The patient is from:Home Anticipated d/c is [id number] Anticipated d/c date is: 2-3 days Patient currently is not medically stable to d/c.Pt requires ongoing IV diuresis.   Consultants:  None  Procedures:  See below  Antimicrobials:  None   Nutritional Assessment:  The patient's BMI is: Body mass index is 32.41 kg/m.[name]  Subjective: Patient seen and evaluated today with some altered mentation and agitation this morning.  He was noted to be hypoglycemic.  His condition has improved dramatically after administration of D50.  He continues to have significant ongoing diuresis, but still has significant scrotal edema.  Objective: Vitals:   10/25/20 0429 10/25/20 0510 10/25/20 0643 10/25/20 0725  BP:  112/76 118/73   Pulse:  96 61   Resp:  20 (!) 22   Temp:  (!) 97.3 F (36.3 C)    TempSrc:  Oral    SpO2:  94% 95% 98%  Weight: 114.5 kg     Height:       No intake or output data in the 24 hours ending 10/25/20 0926 Filed Weights   10/23/20 0700 10/25/20 0429  Weight: 116.8 kg 114.5 kg    Examination:  General exam: Appears calm and comfortable  Respiratory system: Clear to auscultation. Respiratory effort normal. Cardiovascular system: S1 &  S2 heard, RRR.  Gastrointestinal system: Abdomen is soft Central nervous system: Alert and awake Extremities: Persistent bilateral edema with scrotal edema noted Skin: No  significant lesions noted Psychiatry: Flat affect.    Data Reviewed: I have personally reviewed following labs and imaging studies  CBC: Recent Labs  Lab 10/22/20 0910 10/23/20 0610 10/24/20 0500  WBC 6.6 9.3 9.0  NEUTROABS 4.8  --   --   HGB 12.1* 11.7* 11.4*  HCT 37.9* 37.4* 36.0*  MCV 94.0 94.9 93.3  PLT 424* 479* 341*   Basic Metabolic Panel: Recent Labs  Lab 10/22/20 0910 10/23/20 0610 10/24/20 0500 10/25/20 0410  NA 128* 131* 133* 134*  K 4.0 4.3 3.5 3.8  CL 88* 89* 92* 92*  CO2 30 34* 32 33*  GLUCOSE 538* 285* 63* 38*  BUN 16 18 24* 28*  CREATININE 1.38* 1.48* 1.29* 1.34*  CALCIUM 8.2* 8.0* 8.0* 8.0*  MG  --  1.7 1.9 2.0   GFR: Estimated Creatinine Clearance: 83.8 mL/min (A) (by C-G formula based on SCr of 1.34 mg/dL (H)). Liver Function Tests: Recent Labs  Lab 10/22/20 0910 10/23/20 0610  AST 32 31  ALT 33 29  ALKPHOS 135* 113  BILITOT 0.8 0.8  PROT 6.0* 5.3*  ALBUMIN 2.0* 1.7*   No results for input(s): LIPASE, AMYLASE in the last 168 hours. Recent Labs  Lab 10/25/20 0720  AMMONIA 25   Coagulation Profile: No results for input(s): INR, PROTIME in the last 168 hours. Cardiac Enzymes: No results for input(s): CKTOTAL, CKMB, CKMBINDEX, TROPONINI in the last 168 hours. BNP (last 3 results) No results for input(s): PROBNP in the last 8760 hours. HbA1C: No results for input(s): HGBA1C in the last 72 hours. CBG: Recent Labs  Lab 10/24/20 0752 10/24/20 1056 10/24/20 1648 10/24/20 2147 10/25/20 0723  GLUCAP 80 118* 209* 139* 115*   Lipid Profile: No results for input(s): CHOL, HDL, LDLCALC, TRIG, CHOLHDL, LDLDIRECT in the last 72 hours. Thyroid Function Tests: No results for input(s): TSH, T4TOTAL, FREET4, T3FREE, THYROIDAB in the last 72 hours. Anemia Panel: No results for input(s): VITAMINB12, FOLATE, FERRITIN, TIBC, IRON, RETICCTPCT in the last 72 hours. Sepsis Labs: No results for input(s): PROCALCITON, LATICACIDVEN in the last 168  hours.  Recent Results (from the past 240 hour(s))  Resp Panel by RT-PCR (Flu A&B, Covid) Nasopharyngeal Swab     Status: None   Collection Time: 10/22/20 10:42 AM   Specimen: Nasopharyngeal Swab; Nasopharyngeal(NP) swabs in vial transport medium  Result Value Ref Range Status   SARS Coronavirus 2 by RT PCR NEGATIVE NEGATIVE Final    Comment: (NOTE) SARS-CoV-2 target nucleic acids are NOT DETECTED.  The SARS-CoV-2 RNA is generally detectable in upper respiratory specimens during the acute phase of infection. The lowest concentration of SARS-CoV-2 viral copies this assay can detect is 138 copies/mL. A negative result does not preclude SARS-Cov-2 infection and should not be used as the sole basis for treatment or other patient management decisions. A negative result may occur with  improper specimen collection/handling, submission of specimen other than nasopharyngeal swab, presence of viral mutation(s) within the areas targeted by this assay, and inadequate number of viral copies(<138 copies/mL). A negative result must be combined with clinical observations, patient history, and epidemiological information. The expected result is Negative.  Fact Sheet for Patients:  EntrepreneurPulse.com.au  Fact Sheet for Healthcare Providers:  IncredibleEmployment.be  This test is no t yet approved or cleared by the [place] and  has been authorized  for detection and/or diagnosis of SARS-CoV-2 by FDA under an Emergency Use Authorization (EUA). This EUA will remain  in effect (meaning this test can be used) for the duration of the COVID-19 declaration under Section 564(b)(1) of the Act, 21 U.S.C.section 360bbb-3(b)(1), unless the authorization is terminated  or revoked sooner.       Influenza A by PCR NEGATIVE NEGATIVE Final   Influenza B by PCR NEGATIVE NEGATIVE Final    Comment: (NOTE) The Xpert Xpress SARS-CoV-2/FLU/RSV plus assay is intended  as an aid in the diagnosis of influenza from Nasopharyngeal swab specimens and should not be used as a sole basis for treatment. Nasal washings and aspirates are unacceptable for Xpert Xpress SARS-CoV-2/FLU/RSV testing.  Fact Sheet for Patients: EntrepreneurPulse.com.au  Fact Sheet for Healthcare Providers: IncredibleEmployment.be  This test is not yet approved or cleared by the [place] FDA and has been authorized for detection and/or diagnosis of SARS-CoV-2 by FDA under an Emergency Use Authorization (EUA). This EUA will remain in effect (meaning this test can be used) for the duration of the COVID-19 declaration under Section 564(b)(1) of the Act, 21 U.S.C. section 360bbb-3(b)(1), unless the authorization is terminated or revoked.  Performed at [facility], [address] [place], [place] [address]          Radiology Studies: No results found.      Scheduled Meds: . apixaban  5 mg Oral BID  . aspirin EC  81 mg Oral Q breakfast  . atorvastatin  80 mg Oral Daily  . busPIRone  7.5 mg Oral BID  . famotidine  20 mg Oral BID  . furosemide  80 mg Intravenous Q8H  . gabapentin  300 mg Oral TID  . insulin aspart  0-5 Units Subcutaneous QHS  . insulin aspart  0-9 Units Subcutaneous TID WC  . insulin glargine  20 Units Subcutaneous QPM  . mometasone-formoterol  2 puff Inhalation BID  . potassium chloride  10 mEq Oral BID  . sodium chloride flush  3 mL Intravenous Q12H  . traZODone  150 mg Oral QHS   Continuous Infusions: . sodium chloride       LOS: 3 days    Time spent: 35 minutes    Maigan Bittinger Darleen Crocker, DO [facility]  If 7PM-7AM, please contact night-coverage [email or web address] 10/25/2020, 9:26 AM

## 2020-10-25 NOTE — NC FL2 (Signed)
[place] LEVEL OF CARE SCREENING TOOL     IDENTIFICATION  Patient Name: Dustin Cruz Birthdate: 01-02-1966 Sex: male Admission Date (Current Location): 10/22/2020  [place] and [place] Number:  [place] [facility] [id number] [place] and Address:  [facility] [address] [place]      Provider Number: [id number]  Attending Physician Name and Address:  Rodena Goldmann, DO  Relative Name and Phone Number:  Mammie Russian (significant other) Ph: [phone]    Current Level of Care: Hospital Recommended Level of Care: [place] Prior Approval Number:    Date Approved/Denied:   PASRR Number: Pending  Discharge Plan: SNF    Current Diagnoses: Patient Active Problem List   Diagnosis Date Noted  . Acute on chronic diastolic (congestive) heart failure ([place]) 10/22/2020  . Acute on chronic combined systolic and diastolic CHF (congestive heart failure) ([place]) 09/28/2020  . Peripheral edema 09/27/2020  . Acute on chronic systolic (congestive) heart failure ([place]) 09/27/2020  . Scrotum swelling 09/26/2020  . Acute diastolic CHF (congestive heart failure) ([place]) 07/23/2020  . Abnormal weight gain 07/23/2020  . Noncompliance 07/23/2020  . Pelvic lymphadenopathy 07/20/2020  . Acute on chronic diastolic CHF (congestive heart failure) ([place]) 06/30/2020  . CHF (congestive heart failure) ([place]) 06/29/2020  . Pre-ulcerative calluses 10/29/2019  . Musculoskeletal pain 10/13/2019  . Housing problems 05/23/2019  . Essential hypertension, benign 03/20/2019  . 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 Near syncope 03/17/2019  . AKI (acute kidney injury) ([place]) 03/17/2019  . 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 CKD stage 3 due to type 2 diabetes mellitus ([place]) 11/28/2018  . Microalbuminuria due to type 2 diabetes mellitus ([place]) 05/22/2018  . Hyperglycemia due to diabetes mellitus ([place]) 04/24/2018  . Abdominal pain, epigastric 11/29/2017  . Diarrhea 11/29/2017   . History of colonic polyps 11/29/2017  . Diffuse abdominal pain 11/29/2017  . Diabetic retinopathy ([place]) 09/21/2015  . Carotid bruit 09/21/2015  . Viral gastroenteritis 06/03/2015  . Hypertriglyceridemia 05/18/2015  . Vitamin D deficiency 12/29/2013  . Nausea alone 04/24/2013  . Frequency of urination 04/14/2013  . Fever, unspecified 04/14/2013  . Hepatic steatosis 12/28/2012  . Peripheral neuropathy 11/25/2012  . Melanoma of skin, site unspecified 11/25/2012  . Mixed hyperlipidemia   . Depression   . Hypertension associated with diabetes ([place])   . GERD (gastroesophageal reflux disease) 04/03/2012  . Current smoker 03/14/2012  . Type 2 diabetes mellitus with stage 3a chronic kidney disease, with long-term current use of insulin ([place])   . CAD (coronary artery disease) 01/06/2011    Orientation RESPIRATION BLADDER Height & Weight     Self,Time,Situation,Place  Normal Continent Weight: 252 lb 6.8 oz (114.5 kg) Height:  6\' 2"  (188 cm)  BEHAVIORAL SYMPTOMS/MOOD NEUROLOGICAL BOWEL NUTRITION STATUS      Continent Diet (Heart healthy/carb modified)  AMBULATORY STATUS COMMUNICATION OF NEEDS Skin   Limited Assist Verbally Other (Comment) (Blister: left foot)                       Personal Care Assistance Level of Assistance  Bathing,Feeding,Dressing Bathing Assistance: Limited assistance Feeding assistance: Independent Dressing Assistance: Limited assistance     Functional Limitations Info  Sight,Hearing,Speech Sight Info: Adequate Hearing Info: Adequate Speech Info: Adequate    SPECIAL CARE FACTORS FREQUENCY  PT (By licensed PT)     PT Frequency: 5x's/week              Contractures Contractures Info: Not present  Additional Factors Info  Code Status,Allergies,Psychotropic,Insulin Sliding Scale Code Status Info: Full Allergies Info: NKA Psychotropic Info: BuSpar (buspirone); Neurontin (gabapentin); Desyrel (trazadone) Insulin Sliding Scale Info: See  discharge summary       Current Medications (10/25/2020):  This is the current hospital active medication list Current Facility-Administered Medications  Medication Dose Route Frequency Provider Last Rate Last Admin  . 0.9 %  sodium chloride infusion  250 mL Intravenous PRN Manuella Ghazi, Pratik D, DO      . acetaminophen (TYLENOL) tablet 650 mg  650 mg Oral Q6H PRN Heath Lark D, DO   650 mg at 10/24/20 1416   Or  . acetaminophen (TYLENOL) suppository 650 mg  650 mg Rectal Q6H PRN Manuella Ghazi, Pratik D, DO      . apixaban (ELIQUIS) tablet 5 mg  5 mg Oral BID Manuella Ghazi, Pratik D, DO   5 mg at 10/25/20 9937  . aspirin EC tablet 81 mg  81 mg Oral Q breakfast Heath Lark D, DO   81 mg at 10/25/20 0742  . atorvastatin (LIPITOR) tablet 80 mg  80 mg Oral Daily Manuella Ghazi, Pratik D, DO   80 mg at 10/25/20 0842  . busPIRone (BUSPAR) tablet 7.5 mg  7.5 mg Oral BID Manuella Ghazi, Pratik D, DO   7.5 mg at 10/25/20 0843  . famotidine (PEPCID) tablet 20 mg  20 mg Oral BID Heath Lark D, DO   20 mg at 10/25/20 0843  . furosemide (LASIX) injection 80 mg  80 mg Intravenous Q8H Shah, Pratik D, DO   80 mg at 10/25/20 0656  . gabapentin (NEURONTIN) capsule 300 mg  300 mg Oral TID Heath Lark D, DO   300 mg at 10/25/20 0842  . insulin aspart (novoLOG) injection 0-5 Units  0-5 Units Subcutaneous QHS Shah, Pratik D, DO      . insulin aspart (novoLOG) injection 0-9 Units  0-9 Units Subcutaneous TID WC Shah, Pratik D, DO      . insulin glargine (LANTUS) injection 20 Units  20 Units Subcutaneous QPM Shah, Pratik D, DO      . ipratropium-albuterol (DUONEB) 0.5-2.5 (3) MG/3ML nebulizer solution 3 mL  3 mL Nebulization Q6H PRN Manuella Ghazi, Pratik D, DO      . mometasone-formoterol (DULERA) 200-5 MCG/ACT inhaler 2 puff  2 puff Inhalation BID Manuella Ghazi, Pratik D, DO   2 puff at 10/25/20 0716  . ondansetron (ZOFRAN) tablet 4 mg  4 mg Oral Q6H PRN Manuella Ghazi, Pratik D, DO       Or  . ondansetron (ZOFRAN) injection 4 mg  4 mg Intravenous Q6H PRN Manuella Ghazi, Pratik D, DO      .  oxyCODONE (Oxy IR/ROXICODONE) immediate release tablet 5 mg  5 mg Oral BID PRN Manuella Ghazi, Pratik D, DO   5 mg at 10/25/20 0048  . potassium chloride (KLOR-CON) CR tablet 10 mEq  10 mEq Oral BID Heath Lark D, DO   10 mEq at 10/25/20 0843  . sodium chloride flush (NS) 0.9 % injection 3 mL  3 mL Intravenous Q12H Shah, Pratik D, DO   3 mL at 10/25/20 0843  . sodium chloride flush (NS) 0.9 % injection 3 mL  3 mL Intravenous PRN Manuella Ghazi, Pratik D, DO      . traZODone (DESYREL) tablet 150 mg  150 mg Oral QHS Shah, Pratik D, DO   150 mg at 10/24/20 2312     Discharge Medications: Please see discharge summary for a list of discharge medications.  Relevant Imaging Results:  Relevant Lab Results:   Additional Information SSN: [id number]  Sherie Don, LCSW

## 2020-10-25 NOTE — Evaluation (Signed)
Physical Therapy Evaluation Patient Details Name: HUNTER BACHAR MRN: [id number] DOB: 10-28-1965 Today's Date: 10/25/2020   History of Present Illness  Dustin Cruz is a 55 y.o. male with medical history significant for CAD, type 2 diabetes, diastolic heart failure, GERD, and COPD who presented to the ED with worsening lower extremity edema as well as abdominal edema and scrotal edema as well as some weakness.  He is currently homeless and lives in his car.  He states that he had run out of his Lasix as well as his diabetes medication for the last few days.  It appears that on last admission on 09/2020 that he left AMA as he was being treated for edema at that time.  He denies any fever, chills, abdominal pain, nausea, vomiting, chest pain, or other concerns.    Clinical Impression  Patient limited for functional mobility as stated below secondary to BLE weakness, fatigue and impaired activity tolerance. Patient completes bed mobility with slow, labored movements to transition to edge of bed with HOB elevated. He requires use of bedrails and assist to pull to seated/ for LE movements. Patient demonstrates impaired sitting balance requiring UE support and decreased sitting tolerance. Patient limited by being cold and with fatigue today requesting to return to supine at end of session. Patient requires assist for repositioning in bed. Patient will benefit from continued physical therapy in hospital and recommended venue below to increase strength, balance, endurance for safe ADLs and gait.     Follow Up Recommendations SNF    Equipment Recommendations  None recommended by PT    Recommendations for Other Services       Precautions / Restrictions Precautions Precautions: Fall Restrictions Weight Bearing Restrictions: No      Mobility  Bed Mobility Overal bed mobility: Needs Assistance Bed Mobility: Supine to Sit;Sit to Supine     Supine to sit: Mod assist;HOB elevated Sit to supine: Min  assist   General bed mobility comments: slow, labored movements to transition to edge of bed with HOB elevated, use of bedrails and assist to pull to seated/ for LE movements    Transfers                    Ambulation/Gait                Stairs            Wheelchair Mobility    Modified Rankin (Stroke Patients Only)       Balance Overall balance assessment: Needs assistance Sitting-balance support: Bilateral upper extremity supported Sitting balance-Leahy Scale: Fair Sitting balance - Comments: seated EOB                                     Pertinent Vitals/Pain Pain Assessment: Faces Faces Pain Scale: Hurts little more Pain Location: R side Pain Descriptors / Indicators: Sore Pain Intervention(s): Limited activity within patient's tolerance;Monitored during session;Repositioned    Home Living Family/patient expects to be discharged to:: Unsure                 Additional Comments: Stated he may be able to stay with friends    Prior Function Level of Independence: Independent with assistive device(s)         Comments: 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[employer], independent with basic ADL     Hand Dominance  Extremity/Trunk Assessment   Upper Extremity Assessment Upper Extremity Assessment: Generalized weakness    Lower Extremity Assessment Lower Extremity Assessment: Generalized weakness    Cervical / Trunk Assessment Cervical / Trunk Assessment: Normal  Communication   Communication: No difficulties  Cognition Arousal/Alertness: Awake/alert Behavior During Therapy: WFL for tasks assessed/performed Overall Cognitive Status: Within Functional Limits for tasks assessed                                        General Comments      Exercises     Assessment/Plan    PT Assessment Patient needs continued PT services  PT Problem List Decreased strength;Decreased  mobility;Decreased activity tolerance;Decreased balance;Cardiopulmonary status limiting activity       PT Treatment Interventions DME instruction;Therapeutic exercise;Gait training;Balance training;Stair training;Neuromuscular re-education;Functional mobility training;Therapeutic activities;Patient/family education    PT Goals (Current goals can be found in the Care Plan section)  Acute Rehab PT Goals Patient Stated Goal: Get stronger PT Goal Formulation: With patient Time For Goal Achievement: 11/08/20 Potential to Achieve Goals: Good    Frequency Min 3X/week   Barriers to discharge        Co-evaluation               AM-PAC PT "6 Clicks" Mobility  Outcome Measure Help needed turning from your back to your side while in a flat bed without using bedrails?: A Little Help needed moving from lying on your back to sitting on the side of a flat bed without using bedrails?: A Little Help needed moving to and from a bed to a chair (including a wheelchair)?: A Lot Help needed standing up from a chair using your arms (e.g., wheelchair or bedside chair)?: A Lot Help needed to walk in hospital room?: A Lot Help needed climbing 3-5 steps with a railing? : A Lot 6 Click Score: 14    End of Session   Activity Tolerance: Patient tolerated treatment well Patient left: in bed;with call bell/phone within reach;with bed alarm set Nurse Communication: Mobility status PT Visit Diagnosis: Unsteadiness on feet (R26.81);Other abnormalities of gait and mobility (R26.89);Muscle weakness (generalized) (M62.81)    Time: 8366-2947 PT Time Calculation (min) (ACUTE ONLY): 24 min   Charges:   PT Evaluation $PT Eval Low Complexity: 1 Low PT Treatments $Therapeutic Activity: 8-22 mins        11:33 AM, 10/25/20 Mearl Latin PT, DPT Physical Therapist at [facility]

## 2020-10-25 NOTE — Progress Notes (Signed)
Arrived to room to find Dustin Cruz sitting on side of bed. Appears to be dozing on and off. Vital signs this morning rechecked and continue to be stable. Dustin Cruz has pulled all sheets off bed and thrown in the floor. Dustin Cruz reoriented to room and safety returned to laying position. Dustin Cruz now sleeping but continues to be jumpy and kicking off gown/blankets while sleeping- seems agitated. Will talk to self while sleeping in a jumbled manner. Denies any drug/alcohol use to RN.   Dustin Cruz very irritable overnight with complaints of legs hurting/cramping, then scrotal swelling very painful, very poor sense of judgement & awareness of safety when asked to stay in bed and call for help. Dustin Cruz will not use call light system. Refuses telemetry monitoring. Will provide update to Manuella Ghazi, MD and continue to assess.

## 2020-10-25 NOTE — TOC Initial Note (Addendum)
Transition of Care [facility]) - Initial/Assessment Note   Patient Details  Name: Dustin Cruz MRN: [id number] Date of Birth: 05-23-1966  Transition of Care [facility]) CM/SW Contact:    Sherie Don, LCSW Phone Number: 10/25/2020, 11:37 AM  Clinical Narrative: Patient is a 55 year old male who was admitted for acute on chronic diastolic congestive heart failure. Patient has a history of CAD, type 2 diabetes mellitus with stage 3a chronic kidney disease, GERD, hyperlipidemia, depression, hypertension associated with diabetes, and peripheral neuropathy. Readmission checklist completed due to high readmission score.  CSW spoke with patient to complete assessment. Per patient, he sometimes resides in his car or with a friend. Patient asked about assistance finding an apartment. CSW explained TOC cannot find patient an apartment, but will provide the [employer] information on his AVS with a contact phone number. Patient's only DME is a cane. CSW asked about medications. Patient reported he is able to afford them every month, but is unsure why he ran out of medication before he could refill them again. Patient is providing his own transportation.  [employer] information added to AVS. TOC to follow.  Addendum: PT evaluation recommended SNF. CSW spoke with patient and patient is agreeable to SNF. FL2 completed; PASRR pending and requested documents have been uploaded to [place] MUST. CSW called [employer] to start insurance authorization. TOC to upload documents once patient shows up in [employer] electronic system.  Expected Discharge Plan: Home/Self Care Barriers to Discharge: Homeless with medical needs,Continued Medical Work up  Patient Goals and CMS Choice Patient states their goals for this hospitalization and ongoing recovery are:: Find housing CMS Medicare.gov Compare Post Acute Care list provided to:: Patient Choice offered to / list presented to : Patient  Expected  Discharge Plan and Services Expected Discharge Plan: Home/Self Care In-house Referral: Clinical Social Work Discharge Planning Services: NA Post Acute Care Choice: NA Living arrangements for the past 2 months: Homeless              DME Arranged: N/A DME Agency: NA HH Arranged: NA [place] Agency: NA  Prior Living Arrangements/Services Living arrangements for the past 2 months: Homeless Lives with:: Self Patient language and need for interpreter reviewed:: Yes Do you feel safe going back to the place where you live?: Yes      Need for Family Participation in Patient Care: No (Comment) Care giver support system in place?: Yes (comment) Current home services: DME Kasandra Knudsen) Criminal Activity/Legal Involvement Pertinent to Current Situation/Hospitalization: No - Comment as needed  Activities of Daily Living Home Assistive Devices/Equipment: Cane (specify quad or straight) ADL Screening (condition at time of admission) Patient's cognitive ability adequate to safely complete daily activities?: Yes Is the patient deaf or have difficulty hearing?: No Does the patient have difficulty seeing, even when wearing glasses/contacts?: No Does the patient have difficulty concentrating, remembering, or making decisions?: No Patient able to express need for assistance with ADLs?: No Does the patient have difficulty dressing or bathing?: No Independently performs ADLs?: Yes (appropriate for developmental age) Does the patient have difficulty walking or climbing stairs?: No Weakness of Legs: Both Weakness of Arms/Hands: None  Emotional Assessment Appearance:: Appears stated age Attitude/Demeanor/Rapport: Engaged Affect (typically observed): Accepting Orientation: : Oriented to Self,Oriented to Place,Oriented to  Time,Oriented to Situation Alcohol / Substance Use: Tobacco Use Psych Involvement: No (comment)  Admission diagnosis:  Anasarca [R60.1] Acute on chronic diastolic (congestive) heart failure  ([place]) [I50.33] Patient Active Problem List   Diagnosis Date  Noted  . Acute on chronic diastolic (congestive) heart failure ([place]) 10/22/2020  . Acute on chronic combined systolic and diastolic CHF (congestive heart failure) ([place]) 09/28/2020  . Peripheral edema 09/27/2020  . Acute on chronic systolic (congestive) heart failure ([place]) 09/27/2020  . Scrotum swelling 09/26/2020  . Acute diastolic CHF (congestive heart failure) ([place]) 07/23/2020  . Abnormal weight gain 07/23/2020  . Noncompliance 07/23/2020  . Pelvic lymphadenopathy 07/20/2020  . Acute on chronic diastolic CHF (congestive heart failure) ([place]) 06/30/2020  . CHF (congestive heart failure) ([place]) 06/29/2020  . Pre-ulcerative calluses 10/29/2019  . Musculoskeletal pain 10/13/2019  . Housing problems 05/23/2019  . Essential hypertension, benign 03/20/2019  . 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 Near syncope 03/17/2019  . AKI (acute kidney injury) ([place]) 03/17/2019  . 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 CKD stage 3 due to type 2 diabetes mellitus ([place]) 11/28/2018  . Microalbuminuria due to type 2 diabetes mellitus ([place]) 05/22/2018  . Hyperglycemia due to diabetes mellitus ([place]) 04/24/2018  . Abdominal pain, epigastric 11/29/2017  . Diarrhea 11/29/2017  . History of colonic polyps 11/29/2017  . Diffuse abdominal pain 11/29/2017  . Diabetic retinopathy ([place]) 09/21/2015  . Carotid bruit 09/21/2015  . Viral gastroenteritis 06/03/2015  . Hypertriglyceridemia 05/18/2015  . Vitamin D deficiency 12/29/2013  . Nausea alone 04/24/2013  . Frequency of urination 04/14/2013  . Fever, unspecified 04/14/2013  . Hepatic steatosis 12/28/2012  . Peripheral neuropathy 11/25/2012  . Melanoma of skin, site unspecified 11/25/2012  . Mixed hyperlipidemia   . Depression   . Hypertension associated with diabetes ([place])   . GERD (gastroesophageal reflux disease) 04/03/2012  . Current smoker 03/14/2012  . Type 2 diabetes mellitus with stage 3a chronic kidney  disease, with long-term current use of insulin ([place])   . CAD (coronary artery disease) 01/06/2011   PCP:  [name], DO Pharmacy:   [id number] [place], [place] [address] Phone: [phone] Fax: [phone]  [place], [place] - [address] [place] [address] [place] [address] Phone: [phone] Fax: [phone]  Readmission Risk Interventions Readmission Risk Prevention Plan 10/25/2020 09/29/2020  Transportation Screening Complete Complete  Medication Review (RN Care Manager) Complete Complete  HRI or Home Care Consult Complete Complete  SW Recovery Care/Counseling Consult Complete Complete  Palliative Care Screening Not Applicable Not Applicable  Skilled Nursing Facility Not Applicable Not Complete  Some recent data might be hidden

## 2020-10-25 NOTE — Plan of Care (Signed)
  Problem: Acute Rehab PT Goals(only PT should resolve) Goal: Pt Will Go Supine/Side To Sit Outcome: Progressing Flowsheets (Taken 10/25/2020 1134) Pt will go Supine/Side to Sit: with minimal assist Goal: Patient Will Transfer Sit To/From Stand Outcome: Progressing Flowsheets (Taken 10/25/2020 1134) Patient will transfer sit to/from stand:  with minimal assist  with moderate assist Goal: Pt Will Transfer Bed To Chair/Chair To Bed Outcome: Progressing Flowsheets (Taken 10/25/2020 1134) Pt will Transfer Bed to Chair/Chair to Bed:  with min assist  with mod assist Goal: Pt Will Ambulate Outcome: Progressing Flowsheets (Taken 10/25/2020 1134) Pt will Ambulate:  25 feet  with least restrictive assistive device  with moderate assist Goal: Pt/caregiver will Perform Home Exercise Program Outcome: Progressing Flowsheets (Taken 10/25/2020 1134) Pt/caregiver will Perform Home Exercise Program:  For increased strengthening  For improved balance  Independently  11:35 AM, 10/25/20 Mearl Latin PT, DPT Physical Therapist at [facility]

## 2020-10-26 ENCOUNTER — Inpatient Hospital Stay (HOSPITAL_COMMUNITY): Payer: 59

## 2020-10-26 DIAGNOSIS — I5033 Acute on chronic diastolic (congestive) heart failure: Secondary | ICD-10-CM | POA: Diagnosis not present

## 2020-10-26 DIAGNOSIS — R9431 Abnormal electrocardiogram [ECG] [EKG]: Secondary | ICD-10-CM

## 2020-10-26 LAB — CBC
HCT: 34.7 % — ABNORMAL LOW (ref 39.0–52.0)
Hemoglobin: 10.9 g/dL — ABNORMAL LOW (ref 13.0–17.0)
MCH: 29.4 pg (ref 26.0–34.0)
MCHC: 31.4 g/dL (ref 30.0–36.0)
MCV: 93.5 fL (ref 80.0–100.0)
Platelets: 436 10*3/uL — ABNORMAL HIGH (ref 150–400)
RBC: 3.71 MIL/uL — ABNORMAL LOW (ref 4.22–5.81)
RDW: 15.5 % (ref 11.5–15.5)
WBC: 8.7 10*3/uL (ref 4.0–10.5)
nRBC: 0 % (ref 0.0–0.2)

## 2020-10-26 LAB — GLUCOSE, CAPILLARY
Glucose-Capillary: 275 mg/dL — ABNORMAL HIGH (ref 70–99)
Glucose-Capillary: 193 mg/dL — ABNORMAL HIGH (ref 70–99)
Glucose-Capillary: 73 mg/dL (ref 70–99)
Glucose-Capillary: 294 mg/dL — ABNORMAL HIGH (ref 70–99)
Glucose-Capillary: 277 mg/dL — ABNORMAL HIGH (ref 70–99)
Glucose-Capillary: 119 mg/dL — ABNORMAL HIGH (ref 70–99)
Glucose-Capillary: 227 mg/dL — ABNORMAL HIGH (ref 70–99)
Glucose-Capillary: 290 mg/dL — ABNORMAL HIGH (ref 70–99)
Glucose-Capillary: 134 mg/dL — ABNORMAL HIGH (ref 70–99)

## 2020-10-26 LAB — TROPONIN I (HIGH SENSITIVITY)
Troponin I (High Sensitivity): 36 ng/L — ABNORMAL HIGH (ref <18)
Troponin I (High Sensitivity): 34 ng/L — ABNORMAL HIGH (ref <18)

## 2020-10-26 LAB — ECHOCARDIOGRAM COMPLETE
Area-P 1/2: 6.17 cm2
Calc EF: 35 %
Height: 74 in
S' Lateral: 4.5 cm
Single Plane A2C EF: 23.4 %
Single Plane A4C EF: 45.7 %
Weight: 4112 oz

## 2020-10-26 LAB — BASIC METABOLIC PANEL
Anion gap: 10 (ref 5–15)
BUN: 31 mg/dL — ABNORMAL HIGH (ref 6–20)
CO2: 31 mmol/L (ref 22–32)
Calcium: 8.2 mg/dL — ABNORMAL LOW (ref 8.9–10.3)
Chloride: 91 mmol/L — ABNORMAL LOW (ref 98–111)
Creatinine, Ser: 1.44 mg/dL — ABNORMAL HIGH (ref 0.61–1.24)
GFR, Estimated: 57 mL/min — ABNORMAL LOW (ref >60)
Glucose, Bld: 73 mg/dL (ref 70–99)
Potassium: 4.1 mmol/L (ref 3.5–5.1)
Sodium: 132 mmol/L — ABNORMAL LOW (ref 135–145)

## 2020-10-26 LAB — MAGNESIUM: Magnesium: 1.9 mg/dL (ref 1.7–2.4)

## 2020-10-26 MED ORDER — METOPROLOL TARTRATE 5 MG/5ML IV SOLN: 5.0000 mg | Freq: Four times a day (QID) | INTRAVENOUS | Status: DC | PRN

## 2020-10-26 MED ORDER — FUROSEMIDE 10 MG/ML IJ SOLN
80.0000 mg | Freq: Three times a day (TID) | INTRAMUSCULAR | Status: DC
  Administered 2020-10-26 – 2020-11-04 (×27): 80 mg via INTRAVENOUS
  Filled 2020-10-26 (×26): qty 8

## 2020-10-26 MED ORDER — LEVALBUTEROL HCL 0.63 MG/3ML IN NEBU
0.6300 mg | INHALATION_SOLUTION | Freq: Four times a day (QID) | RESPIRATORY_TRACT | Status: DC | PRN
  Administered 2020-10-26: 0.63 mg via RESPIRATORY_TRACT
  Filled 2020-10-26: qty 3

## 2020-10-26 MED ORDER — MELATONIN 3 MG PO TABS
6.0000 mg | ORAL_TABLET | Freq: Every day | ORAL | Status: DC
  Administered 2020-10-26 – 2020-11-17 (×24): 6 mg via ORAL
  Filled 2020-10-26 (×24): qty 2

## 2020-10-26 MED ORDER — METOPROLOL SUCCINATE ER 25 MG PO TB24
12.5000 mg | ORAL_TABLET | Freq: Every day | ORAL | Status: DC
Start: 2020-10-26 — End: 2020-10-29
  Administered 2020-10-26 – 2020-10-29 (×4): 12.5 mg via ORAL
  Filled 2020-10-26 (×4): qty 1

## 2020-10-26 NOTE — Progress Notes (Addendum)
Initial Nutrition Assessment  DOCUMENTATION CODES:   Obesity unspecified  INTERVENTION:  Provided Low Sodium diet handout/review  Recommend nursing provide: Booklet "Living Well with Heart Failure"  Provide: Heart Failure Management and Sodium and fluid intake videos for reinforcement   NUTRITION DIAGNOSIS:   Food and nutrition related knowledge deficit related to limited prior education (CHF) as evidenced by per patient/family report/ diet history.   GOAL:   (Patient will follow low sodium /CHO modifed diet)  MONITOR:   PO intake,Weight trends,I & O's,Labs,Skin  REASON FOR ASSESSMENT:  Consult Diet education  ASSESSMENT: Patient is a 55 yo male with hx of DM-2, CHF, CKD-3, COPD, Chronic depression, CAD. Patient is homeless and has been living in his car. He presents with volume overload, non-compliance. Acute on chronic CHF.   Patient eats out daily. He likes hamburgers and chicken nuggets. Provided handout - Low Sodium diet and recommended fluid intake. Currently he is on a 1.5 liter fluid restriction. His appetite is excellent and po's 100% of all meals.     Medications reviewed and include: lipitor, buspar, trazodone, pepcid, Insulin (novolog and lantus).  Labs reviewed: A1C-14%, sodium 132 (L), BUN 31, Cr 1.44 (H). Hgb 10.9 (L).   Patient estimated dry weight unknown. Weight history reviewed. He has moderate pitting edema BLE and upper body.    Intake/Output Summary (Last 24 hours) at 10/26/2020 1153 Last data filed at 10/25/2020 1700 Gross per 24 hour  Intake 560 ml  Output 650 ml  Net -90 ml   Since admission: -1.6 liters  Diet Order:   Diet Order            Diet heart healthy/carb modified Room service appropriate? Yes; Fluid consistency: Thin; Fluid restriction: 1500 mL Fluid  Diet effective now                 EDUCATION NEEDS:  Education needs have been addressed Skin:  Skin Assessment: Reviewed RN Assessment  Last BM:  1/16  Height:   Ht  Readings from Last 1 Encounters:  10/23/20 6\' 2"  (1.88 m)    Weight:   Wt Readings from Last 1 Encounters:  10/26/20 116.6 kg    Ideal Body Weight:   86 kg  BMI:  Body mass index is 33 kg/m.  Estimated Nutritional Needs:   Kcal:  2074-2281  Protein:  112-120 gr  Fluid:  1500 ml daily per MD  Colman Cater MS,RD,CSG,LDN Pager: Shea Evans

## 2020-10-26 NOTE — TOC Progression Note (Signed)
Transition of Care [facility]) - Progression Note    Patient Details  Name: Dustin Cruz MRN: [id number] Date of Birth: 1966-02-19  Transition of Care [facility]) CM/SW Contact  Boneta Lucks, RN Phone Number: 10/26/2020, 1:28 PM  Clinical Narrative:   [name] made a bed offer, Patient accepted. Uploaded documents requested by PASSR, number pending.  INS Auth updated with bed offer, MD and RN updated.     Expected Discharge Plan: Skilled Nursing Facility Barriers to Discharge: Homeless with medical needs,Continued Medical Work up  Expected Discharge Plan and Services Expected Discharge Plan: [place] In-house Referral: Clinical Social Work Discharge Planning Services: NA Post Acute Care Choice: NA Living arrangements for the past 2 months: Homeless                 DME Arranged: N/A DME Agency: NA       HH Arranged: NA [place] Agency: NA     Readmission Risk Interventions Readmission Risk Prevention Plan 10/25/2020 09/29/2020  Transportation Screening Complete Complete  Medication Review Press photographer) Complete Complete  HRI or Home Care Consult Complete Complete  SW Recovery Care/Counseling Consult Complete Complete  Palliative Care Screening Not Applicable Not Applicable  Skilled Nursing Facility Not Applicable Not Complete  Some recent data might be hidden

## 2020-10-26 NOTE — Progress Notes (Signed)
1205: Pt called out, stated SOB and "can't breathe". Arrived to room to find pt sitting up on side of bed, resp labored, skin diaphoretic but no cyanosis. Pt states got up to go to bathroom then became severely SOB. States, "I need some oxygen". SaO2 86% on room air, HR 115, b/p 116/70. Pt placed on 2 lpm [place] O2, MD Manuella Ghazi notified of current pt status and that pt placed on O2. Orders received for stat EKG and CXR. EKG completed, pt states that he still feels SOB but better since on O2. HR went up to 135 and irregular, then HR sh owing 90 on telemetry but only conducting 50 bpm on radial pulse. 1235: currently pt with HR 95, only occasional PVC's. MD Manuella Ghazi in room to evaluate pt. Other VSS.

## 2020-10-26 NOTE — Progress Notes (Signed)
PROGRESS NOTE    Dustin Cruz  GYI:[id number] DOB: Jan 21, 1966 DOA: 10/22/2020 PCP: [name], DO   Brief Narrative:  Per HPI: Dustin Cruz a 55 y.o.malewith medical history significant forCAD, type 2 diabetes, diastolic heart failure, GERD, and COPD who presented to the ED with worsening lower extremity edema as well as abdominal edema and scrotal edema as well as some weakness. He is currently homeless and lives in his car. He states that he had run out of his Lasix as well as his diabetes medication for the last few days. It appears that on last admission on 09/2020 that he left AMA as he was being treated for edema at that time. He denies any fever, chills, abdominal pain, nausea, vomiting, chest pain, or other concerns.  -Patient has been admitted with significant volume overload/anasarca in the setting of acute on chronic diastolic CHF exacerbation with poor compliance on home medications.  He was also noted to have some initial hypoglycemia that has now resolved.  Assessment & Plan:   Active Problems:   Acute on chronic diastolic (congestive) heart failure (HCC)   Significant volume overload with anasarca and acute on chronic diastolic CHF exacerbation-improving -Secondary to noncompliance with home medications -Last 2D echocardiogram performed on10/16/2021 with LVEF 60-65% and grade 3 diastolic dysfunction -Continued here on Lasix IV 80 mgnow three timesdaily, for goal diuresis of 2-3L daily, difficult to evaluate given scrotal edema with frequent urination over the floor per nursing staff.  Could not place Foley catheter -Resume home metoprolol with elevated HR as noted below -HoldEntresto, questionable why he is on this given good LVEF? -Monitor daily weights and strict I's and O's -Cardiac diet with fluid restriction -Wound care to lower extremities -Need for scrotal elevation -PT evaluation -Repeat AM labs  Mild hypoxemia -Patient became more  dyspneic with some mild hypoxemia noted on 1/18, no chest pain -Chest x-ray with cardiomegaly and pulmonary vascular congestion, but no frank edema -EKG with some noted bigeminy and tachycardia, repeat EKG in am -Plan to obtain limited 2D echocardiogram and monitor troponins -Resume home metoprolol and order IV metoprolol as needed for heart rate control and monitor on telemetry -Continue diuresis as ordered  Hyperglycemia in the setting of type 2 diabetes-improved -Noncompliant with home medications -Currently with some hypoglycemia; will plan to cut long-acting medication dose in half -Decrease SSI and maintain on carb modified diet -Recent hemoglobin A1c of 14%  Dyslipidemia -Continue Lipitor  CAD with prior stent placement -Continue on aspirin, Eliquis, and Lipitor -Resume home b-blocker -Monitor on telemetry  GERD -Famotidine  COPD -No acute bronchospasms currently noted -Continue Dulera -Duo nebs with Xopenex ordered as needed shortness of breath or wheezing  History of medication noncompliance and leaving AMA -PT/TOC evaluation for needs on dc  Protein calorie malnutrition -Dietitian consultation   DVT prophylaxis:Eliquis Code Status:Full Family Communication: Tried calling Ms. Burnette on 1/17-1/18 with no response Disposition Plan: Status is: Inpatient  Remains inpatient appropriate because:IV treatments appropriate due to intensity of illness or inability to take PO and Inpatient level of care appropriate due to severity of illness   Dispo: The patient is from:Home Anticipated d/c is to:SNF Anticipated d/c date is:2-3days Patient currently is not medically stable to d/c.Pt requires ongoing IV diuresis due to volume overload and mild hypoxemia.  Need to monitor heart rate control on telemetry as well.   Consultants:  None  Procedures:  See  below  Antimicrobials:  None   Subjective: Patient seen and evaluated today  with some worsening dyspnea and hypoxemia noted this afternoon. Otherwise, appears to be diuresing well.  Difficult to assess I/O given scrotal edema.  Objective: Vitals:   10/26/20 1213 10/26/20 1225 10/26/20 1334 10/26/20 1415  BP:  137/88 110/79   Pulse: (!) 134 (!) 50 (!) 112 62  Resp: (!) 24 (!) 24 18   Temp:   98 F (36.7 C)   TempSrc:   Oral   SpO2: 92% 100% (!) 87% 100%  Weight:      Height:        Intake/Output Summary (Last 24 hours) at 10/26/2020 1419 Last data filed at 10/25/2020 1700 Gross per 24 hour  Intake 240 ml  Output 650 ml  Net -410 ml   Filed Weights   10/25/20 0429 10/26/20 0618 10/26/20 0837  Weight: 114.5 kg 117.3 kg 116.6 kg    Examination:  General exam: Appears calm and comfortable  Respiratory system: Clear to auscultation. Respiratory effort normal.  Currently on 2 L nasal cannula oxygen Cardiovascular system: S1 & S2 heard, RRR.  Gastrointestinal system: Abdomen is soft Central nervous system: Alert and awake Extremities: Persistent bilateral lower extremity edema with scrotal edema Skin: No significant lesions noted Psychiatry: Flat affect.    Data Reviewed: I have personally reviewed following labs and imaging studies  CBC: Recent Labs  Lab 10/22/20 0910 10/23/20 0610 10/24/20 0500 10/26/20 0613  WBC 6.6 9.3 9.0 8.7  NEUTROABS 4.8  --   --   --   HGB 12.1* 11.7* 11.4* 10.9*  HCT 37.9* 37.4* 36.0* 34.7*  MCV 94.0 94.9 93.3 93.5  PLT 424* 479* 482* 937*   Basic Metabolic Panel: Recent Labs  Lab 10/22/20 0910 10/23/20 0610 10/24/20 0500 10/25/20 0410 10/26/20 0613  NA 128* 131* 133* 134* 132*  K 4.0 4.3 3.5 3.8 4.1  CL 88* 89* 92* 92* 91*  CO2 30 34* 32 33* 31  GLUCOSE 538* 285* 63* 38* 73  BUN 16 18 24* 28* 31*  CREATININE 1.38* 1.48* 1.29* 1.34* 1.44*  CALCIUM 8.2* 8.0* 8.0* 8.0* 8.2*  MG  --  1.7 1.9 2.0 1.9   GFR: Estimated  Creatinine Clearance: 78.7 mL/min (A) (by C-G formula based on SCr of 1.44 mg/dL (H)). Liver Function Tests: Recent Labs  Lab 10/22/20 0910 10/23/20 0610  AST 32 31  ALT 33 29  ALKPHOS 135* 113  BILITOT 0.8 0.8  PROT 6.0* 5.3*  ALBUMIN 2.0* 1.7*   No results for input(s): LIPASE, AMYLASE in the last 168 hours. Recent Labs  Lab 10/25/20 0720  AMMONIA 25   Coagulation Profile: No results for input(s): INR, PROTIME in the last 168 hours. Cardiac Enzymes: No results for input(s): CKTOTAL, CKMB, CKMBINDEX, TROPONINI in the last 168 hours. BNP (last 3 results) No results for input(s): PROBNP in the last 8760 hours. HbA1C: No results for input(s): HGBA1C in the last 72 hours. CBG: Recent Labs  Lab 10/25/20 1115 10/25/20 1557 10/25/20 2051 10/26/20 0724 10/26/20 1111  GLUCAP 181* 294* 277* 119* 227*   Lipid Profile: No results for input(s): CHOL, HDL, LDLCALC, TRIG, CHOLHDL, LDLDIRECT in the last 72 hours. Thyroid Function Tests: No results for input(s): TSH, T4TOTAL, FREET4, T3FREE, THYROIDAB in the last 72 hours. Anemia Panel: No results for input(s): VITAMINB12, FOLATE, FERRITIN, TIBC, IRON, RETICCTPCT in the last 72 hours. Sepsis Labs: No results for input(s): PROCALCITON, LATICACIDVEN in the last 168 hours.  Recent Results (from the past 240 hour(s))  Resp Panel by RT-PCR (Flu A&B, Covid)  Nasopharyngeal Swab     Status: None   Collection Time: 10/22/20 10:42 AM   Specimen: Nasopharyngeal Swab; Nasopharyngeal(NP) swabs in vial transport medium  Result Value Ref Range Status   SARS Coronavirus 2 by RT PCR NEGATIVE NEGATIVE Final    Comment: (NOTE) SARS-CoV-2 target nucleic acids are NOT DETECTED.  The SARS-CoV-2 RNA is generally detectable in upper respiratory specimens during the acute phase of infection. The lowest concentration of SARS-CoV-2 viral copies this assay can detect is 138 copies/mL. A negative result does not preclude SARS-Cov-2 infection and should  not be used as the sole basis for treatment or other patient management decisions. A negative result may occur with  improper specimen collection/handling, submission of specimen other than nasopharyngeal swab, presence of viral mutation(s) within the areas targeted by this assay, and inadequate number of viral copies(<138 copies/mL). A negative result must be combined with clinical observations, patient history, and epidemiological information. The expected result is Negative.  Fact Sheet for Patients:  EntrepreneurPulse.com.au  Fact Sheet for Healthcare Providers:  IncredibleEmployment.be  This test is no t yet approved or cleared by the [place] FDA and  has been authorized for detection and/or diagnosis of SARS-CoV-2 by FDA under an Emergency Use Authorization (EUA). This EUA will remain  in effect (meaning this test can be used) for the duration of the COVID-19 declaration under Section 564(b)(1) of the Act, 21 U.S.C.section 360bbb-3(b)(1), unless the authorization is terminated  or revoked sooner.       Influenza A by PCR NEGATIVE NEGATIVE Final   Influenza B by PCR NEGATIVE NEGATIVE Final    Comment: (NOTE) The Xpert Xpress SARS-CoV-2/FLU/RSV plus assay is intended as an aid in the diagnosis of influenza from Nasopharyngeal swab specimens and should not be used as a sole basis for treatment. Nasal washings and aspirates are unacceptable for Xpert Xpress SARS-CoV-2/FLU/RSV testing.  Fact Sheet for Patients: EntrepreneurPulse.com.au  Fact Sheet for Healthcare Providers: IncredibleEmployment.be  This test is not yet approved or cleared by the [place] FDA and has been authorized for detection and/or diagnosis of SARS-CoV-2 by FDA under an Emergency Use Authorization (EUA). This EUA will remain in effect (meaning this test can be used) for the duration of the COVID-19 declaration under  Section 564(b)(1) of the Act, 21 U.S.C. section 360bbb-3(b)(1), unless the authorization is terminated or revoked.  Performed at [facility], [address] [place], [place] [address]          Radiology Studies: DG Chest 1 View  Result Date: 10/26/2020 CLINICAL DATA:  Leg swelling, shortness of breath, negative COVID EXAM: CHEST  1 VIEW COMPARISON:  10/22/2020 FINDINGS: Mild cardiomegaly and pulmonary vascular prominence. No overt airspace opacity. The visualized skeletal structures are unremarkable. IMPRESSION: Mild cardiomegaly and pulmonary vascular prominence. No overt pulmonary edema or other airspace opacity. Electronically Signed   By: Eddie Candle M.D.   On: 10/26/2020 13:12        Scheduled Meds: . apixaban  5 mg Oral BID  . aspirin EC  81 mg Oral Q breakfast  . atorvastatin  80 mg Oral Daily  . busPIRone  7.5 mg Oral BID  . famotidine  20 mg Oral BID  . furosemide  80 mg Intravenous Q8H  . gabapentin  300 mg Oral TID  . insulin aspart  0-5 Units Subcutaneous QHS  . insulin aspart  0-9 Units Subcutaneous TID WC  . insulin glargine  20 Units Subcutaneous QPM  . melatonin  6 mg Oral QHS  .  mometasone-formoterol  2 puff Inhalation BID  . potassium chloride  10 mEq Oral BID  . sodium chloride flush  3 mL Intravenous Q12H  . traZODone  150 mg Oral QHS   Continuous Infusions: . sodium chloride       LOS: 4 days    Time spent: 35 minutes    Kamaree Wheatley Darleen Crocker, DO [facility]  If 7PM-7AM, please contact night-coverage [email or web address] 10/26/2020, 2:19 PM

## 2020-10-26 NOTE — Progress Notes (Signed)
*  PRELIMINARY RESULTS* Echocardiogram 2D Echocardiogram has been performed.  Dustin Cruz 10/26/2020, 3:11 PM

## 2020-10-27 DIAGNOSIS — I5033 Acute on chronic diastolic (congestive) heart failure: Secondary | ICD-10-CM | POA: Diagnosis not present

## 2020-10-27 LAB — CBC
HCT: 34 % — ABNORMAL LOW (ref 39.0–52.0)
Hemoglobin: 10.6 g/dL — ABNORMAL LOW (ref 13.0–17.0)
MCH: 29.4 pg (ref 26.0–34.0)
MCHC: 31.2 g/dL (ref 30.0–36.0)
MCV: 94.2 fL (ref 80.0–100.0)
Platelets: 448 10*3/uL — ABNORMAL HIGH (ref 150–400)
RBC: 3.61 MIL/uL — ABNORMAL LOW (ref 4.22–5.81)
RDW: 15.4 % (ref 11.5–15.5)
WBC: 7.4 10*3/uL (ref 4.0–10.5)
nRBC: 0 % (ref 0.0–0.2)

## 2020-10-27 LAB — GLUCOSE, CAPILLARY
Glucose-Capillary: 19 mg/dL — CL (ref 70–99)
Glucose-Capillary: 85 mg/dL (ref 70–99)
Glucose-Capillary: 54 mg/dL — ABNORMAL LOW (ref 70–99)
Glucose-Capillary: 63 mg/dL — ABNORMAL LOW (ref 70–99)
Glucose-Capillary: 87 mg/dL (ref 70–99)
Glucose-Capillary: 203 mg/dL — ABNORMAL HIGH (ref 70–99)
Glucose-Capillary: 227 mg/dL — ABNORMAL HIGH (ref 70–99)

## 2020-10-27 LAB — BASIC METABOLIC PANEL
Anion gap: 11 (ref 5–15)
BUN: 36 mg/dL — ABNORMAL HIGH (ref 6–20)
CO2: 31 mmol/L (ref 22–32)
Calcium: 8.5 mg/dL — ABNORMAL LOW (ref 8.9–10.3)
Chloride: 92 mmol/L — ABNORMAL LOW (ref 98–111)
Creatinine, Ser: 1.43 mg/dL — ABNORMAL HIGH (ref 0.61–1.24)
GFR, Estimated: 58 mL/min — ABNORMAL LOW (ref >60)
Glucose, Bld: 25 mg/dL — CL (ref 70–99)
Potassium: 4.2 mmol/L (ref 3.5–5.1)
Sodium: 134 mmol/L — ABNORMAL LOW (ref 135–145)

## 2020-10-27 LAB — MAGNESIUM: Magnesium: 2 mg/dL (ref 1.7–2.4)

## 2020-10-27 MED ORDER — NYSTATIN 100000 UNIT/GM EX POWD
Freq: Two times a day (BID) | CUTANEOUS | Status: DC
  Administered 2020-11-15: 1 via TOPICAL
  Filled 2020-10-27 (×5): qty 15

## 2020-10-27 MED ORDER — INSULIN GLARGINE 100 UNIT/ML ~~LOC~~ SOLN
12.0000 [IU] | Freq: Every evening | SUBCUTANEOUS | Status: DC
  Administered 2020-10-27 – 2020-10-28 (×2): 12 [IU] via SUBCUTANEOUS
  Filled 2020-10-27 (×2): qty 0.12

## 2020-10-27 MED ORDER — INSULIN GLARGINE 100 UNIT/ML ~~LOC~~ SOLN
14.0000 [IU] | Freq: Every evening | SUBCUTANEOUS | Status: DC
  Filled 2020-10-27: qty 0.14

## 2020-10-27 MED ORDER — DEXTROSE 50 % IV SOLN
INTRAVENOUS | Status: AC
  Administered 2020-10-27: 50 mL
  Filled 2020-10-27: qty 50

## 2020-10-27 NOTE — Progress Notes (Signed)
CRITICAL VALUE ALERT  Critical Value: blood glucose  25  Date & Time Notied:  10/27/20 @0652   Provider Notified: yes   Orders Received/Actions taken: nurse notified

## 2020-10-27 NOTE — Progress Notes (Signed)
Patient found sitting on the floor on his knees by Lawernce Keas, RN. Bed alarm activated. Patient states he doesn't know what he was doing. Patient appeared confused and clammy. Juice and crackers offered. CBG 29. D50 given IV per hypoglycemic protocol. Vitals signs WNL.  No complaint of pain. Dr. Josephine Cables made aware.  Educated patient on calling for assistance. Bed in lowest position, call light within reach, and bed alarm active.

## 2020-10-27 NOTE — Progress Notes (Signed)
TRH night shift.  The patient's most recent EKG and telemetry monitoring reveal a higher heart rate than seen on vital signs.  His EKG heart rate was106 bpm.  Please see actual tracing and attached below EKG result report.  He has been fluctuating between 80s and the 90s when I personally checked the telemetry monitor for couple minutes.  The staff reports earlier bradycardic measurements were from palpate a pulse only.  Vent. rate 104 BPM PR interval * ms QRS duration 138 ms QT/QTc 374/491 ms P-R-T axes * 13 3 Wide QRS rhythm Non-specific intra-ventricular conduction block Possible Anterolateral infarct , age undetermined Abnormal ECG  The patient had a wide QRS syndrome earlier, which has disappeared on the telemetry monitor after magnesium sulfate 2 g IVPB was given.His repeat potassium is 4.3 mmol/L and repeat magnesium was 2.0 mg/dL.  Hemoglobin was 10.4 g/dL.  These values are similar to previous measurements.  His sodium is now 129 mmol/L, but he is currently getting furosemide 80 mg IVP every 8 hours.  We will continue to monitor.  Tennis Must, ND.

## 2020-10-27 NOTE — Progress Notes (Signed)
TRH night shift.  The staff reports that the patient had a 16 beat V. tach run 2336.  Earlier, his potassium was 4.2 and magnesium 2.0 mg/dL.  He is currently bradycardic in the 50s and in no acute distress.  We will continue to monitor.  We will check an EKG and remeasure electrolytes if it recurs.  Tennis Must, MD.

## 2020-10-27 NOTE — Progress Notes (Signed)
PROGRESS NOTE    Dustin Cruz  [id number] DOB: 07-30-1966 DOA: 10/22/2020 PCP: [name], DO   Brief Narrative:  Per HPI: Dustin Cruz a 55 y.o.malewith medical history significant forCAD, type 2 diabetes, diastolic heart failure, GERD, and COPD who presented to the ED with worsening lower extremity edema as well as abdominal edema and scrotal edema as well as some weakness. He is currently homeless and lives in his car. He states that he had run out of his Lasix as well as his diabetes medication for the last few days. It appears that on last admission on 09/2020 that he left AMA as he was being treated for edema at that time. He denies any fever, chills, abdominal pain, nausea, vomiting, chest pain, or other concerns.  -Patient has been admitted with significant volume overload/anasarca in the setting of acute on chronic diastolic CHF exacerbation with poor compliance on home medications.  He was also noted to have some initial hypoglycemia that has now resolved.  Assessment & Plan:   Active Problems:   Acute on chronic diastolic (congestive) heart failure (HCC)   Significant volume overload with anasarca and acute on chronic diastolic CHF exacerbation-improving -Secondary to noncompliance with home medications -Last 2D echocardiogram performed on10/16/2021 with LVEF 60-65% and grade 3 diastolic dysfunction, repeated Echo 1/18 with LVEF 35-40% -Continued here on Lasix IV 80 mgnow three timesdaily, for goal diuresis of 2-3L daily, difficult to evaluate given scrotal edema with frequent urination over the floor per nursing staff.  Could not place Foley catheter -I/O NOT ACCURATELY BEING RECORDED -Resumed home metoprolol with elevated HR as noted below -Entresto was temporarily held.  -Monitor daily weights and strict I's and O's -Cardiac diet with fluid restriction -Wound care to lower extremities -PT evaluation -Repeat AM labs -Pt has been refusing to follow  diet and fluid restrictions  Filed Weights   10/25/20 0429 10/26/20 0618 10/26/20 0837  Weight: 114.5 kg 117.3 kg 116.6 kg    Mild hypoxemia -Patient became more dyspneic with some mild hypoxemia noted on 1/18, no chest pain -Chest x-ray with cardiomegaly and pulmonary vascular congestion, but no frank edema -EKG with some noted bigeminy and tachycardia, repeat EKG in am -Plan to obtain limited 2D echocardiogram and monitor troponins -Resume home metoprolol and order IV metoprolol as needed for heart rate control and monitor on telemetry -Continue diuresis as ordered  Hyperglycemia in the setting of type 2 diabetes-improved -Noncompliant with home medications -Currently with some hypoglycemia; will plan to cut long-acting medication dose in half -Decrease SSI and maintain on carb modified diet -Recent hemoglobin A1c of 14%  Dyslipidemia -Continue Lipitor  CAD with prior stent placement -Continue on aspirin, Eliquis, and Lipitor -Resume home b-blocker -Monitor on telemetry  GERD -Famotidine  COPD -No acute bronchospasms currently noted -Continue Dulera -Duo nebs with Xopenex ordered as needed shortness of breath or wheezing  History of medication noncompliance and leaving AMA -PT/TOC evaluation for needs on dc  Protein calorie malnutrition -Dietitian consultation   DVT prophylaxis:Eliquis Code Status:Full Family Communication: Tried calling Ms. Burnette with no response Disposition Plan: Status is: Inpatient  Remains inpatient appropriate because:IV treatments appropriate due to intensity of illness or inability to take PO and Inpatient level of care appropriate due to severity of illness  Dispo: The patient is from:Home Anticipated d/c is to:SNF Anticipated d/c date is:2-3days Patient currently is not medically stable to d/c.Pt requires ongoing IV diuresis due to volume overload and mild hypoxemia.  Need  to  monitor heart rate control on telemetry as well.   Consultants:  None  Procedures:  See below  Antimicrobials:  None  Subjective: Pt reports only minimal improvement in leg edema.  Had severe low blood sugar this morning.    Objective: Vitals:   10/27/20 0534 10/27/20 0843 10/27/20 1301 10/27/20 1609  BP: (!) 141/81  140/79   Pulse: 87  91   Resp: 19  19   Temp: 97.9 F (36.6 C)  98 F (36.7 C)   TempSrc:   Oral   SpO2: 100% 97% 97% 100%  Weight:      Height:        Intake/Output Summary (Last 24 hours) at 10/27/2020 1624 Last data filed at 10/27/2020 0900 Gross per 24 hour  Intake 600 ml  Output 750 ml  Net -150 ml   Filed Weights   10/25/20 0429 10/26/20 0618 10/26/20 0837  Weight: 114.5 kg 117.3 kg 116.6 kg    Examination:  General exam: Appears calm and comfortable  Respiratory system: bibasilar crackles heard. Moderate increased work of breathing.  Cardiovascular system: normal S1 & S2 heard. No murmur heard.  Gastrointestinal system: Abdomen is soft Central nervous system: Alert and awake Extremities:2+ pitting edema BLEs Skin: No significant lesions noted Psychiatry: normal affect.   Data Reviewed: I have personally reviewed following labs and imaging studies  CBC: Recent Labs  Lab 10/22/20 0910 10/23/20 0610 10/24/20 0500 10/26/20 0613 10/27/20 0357  WBC 6.6 9.3 9.0 8.7 7.4  NEUTROABS 4.8  --   --   --   --   HGB 12.1* 11.7* 11.4* 10.9* 10.6*  HCT 37.9* 37.4* 36.0* 34.7* 34.0*  MCV 94.0 94.9 93.3 93.5 94.2  PLT 424* 479* 482* 436* [id number]*   Basic Metabolic Panel: Recent Labs  Lab 10/23/20 0610 10/24/20 0500 10/25/20 0410 10/26/20 0613 10/27/20 0357  NA 131* 133* 134* 132* 134*  K 4.3 3.5 3.8 4.1 4.2  CL 89* 92* 92* 91* 92*  CO2 34* 32 33* 31 31  GLUCOSE 285* 63* 38* 73 25*  BUN 18 24* 28* 31* 36*  CREATININE 1.48* 1.29* 1.34* 1.44* 1.43*  CALCIUM 8.0* 8.0* 8.0* 8.2* 8.5*  MG 1.7 1.9 2.0 1.9 2.0   GFR: Estimated  Creatinine Clearance: 79.3 mL/min (A) (by C-G formula based on SCr of 1.43 mg/dL (H)). Liver Function Tests: Recent Labs  Lab 10/22/20 0910 10/23/20 0610  AST 32 31  ALT 33 29  ALKPHOS 135* 113  BILITOT 0.8 0.8  PROT 6.0* 5.3*  ALBUMIN 2.0* 1.7*   No results for input(s): LIPASE, AMYLASE in the last 168 hours. Recent Labs  Lab 10/25/20 0720  AMMONIA 25   Coagulation Profile: No results for input(s): INR, PROTIME in the last 168 hours. Cardiac Enzymes: No results for input(s): CKTOTAL, CKMB, CKMBINDEX, TROPONINI in the last 168 hours. BNP (last 3 results) No results for input(s): PROBNP in the last 8760 hours. HbA1C: No results for input(s): HGBA1C in the last 72 hours. CBG: Recent Labs  Lab 10/27/20 0656 10/27/20 0708 10/27/20 0739 10/27/20 1105 10/27/20 1611  GLUCAP 63* 54* 87 203* 227*   Lipid Profile: No results for input(s): CHOL, HDL, LDLCALC, TRIG, CHOLHDL, LDLDIRECT in the last 72 hours. Thyroid Function Tests: No results for input(s): TSH, T4TOTAL, FREET4, T3FREE, THYROIDAB in the last 72 hours. Anemia Panel: No results for input(s): VITAMINB12, FOLATE, FERRITIN, TIBC, IRON, RETICCTPCT in the last 72 hours. Sepsis Labs: No results for input(s): PROCALCITON, LATICACIDVEN in the last 168  hours.  Recent Results (from the past 240 hour(s))  Resp Panel by RT-PCR (Flu A&B, Covid) Nasopharyngeal Swab     Status: None   Collection Time: 10/22/20 10:42 AM   Specimen: Nasopharyngeal Swab; Nasopharyngeal(NP) swabs in vial transport medium  Result Value Ref Range Status   SARS Coronavirus 2 by RT PCR NEGATIVE NEGATIVE Final    Comment: (NOTE) SARS-CoV-2 target nucleic acids are NOT DETECTED.  The SARS-CoV-2 RNA is generally detectable in upper respiratory specimens during the acute phase of infection. The lowest concentration of SARS-CoV-2 viral copies this assay can detect is 138 copies/mL. A negative result does not preclude SARS-Cov-2 infection and should not  be used as the sole basis for treatment or other patient management decisions. A negative result may occur with  improper specimen collection/handling, submission of specimen other than nasopharyngeal swab, presence of viral mutation(s) within the areas targeted by this assay, and inadequate number of viral copies(<138 copies/mL). A negative result must be combined with clinical observations, patient history, and epidemiological information. The expected result is Negative.  Fact Sheet for Patients:  EntrepreneurPulse.com.au  Fact Sheet for Healthcare Providers:  IncredibleEmployment.be  This test is no t yet approved or cleared by the [place] FDA and  has been authorized for detection and/or diagnosis of SARS-CoV-2 by FDA under an Emergency Use Authorization (EUA). This EUA will remain  in effect (meaning this test can be used) for the duration of the COVID-19 declaration under Section 564(b)(1) of the Act, 21 U.S.C.section 360bbb-3(b)(1), unless the authorization is terminated  or revoked sooner.       Influenza A by PCR NEGATIVE NEGATIVE Final   Influenza B by PCR NEGATIVE NEGATIVE Final    Comment: (NOTE) The Xpert Xpress SARS-CoV-2/FLU/RSV plus assay is intended as an aid in the diagnosis of influenza from Nasopharyngeal swab specimens and should not be used as a sole basis for treatment. Nasal washings and aspirates are unacceptable for Xpert Xpress SARS-CoV-2/FLU/RSV testing.  Fact Sheet for Patients: EntrepreneurPulse.com.au  Fact Sheet for Healthcare Providers: IncredibleEmployment.be  This test is not yet approved or cleared by the [place] FDA and has been authorized for detection and/or diagnosis of SARS-CoV-2 by FDA under an Emergency Use Authorization (EUA). This EUA will remain in effect (meaning this test can be used) for the duration of the COVID-19 declaration under Section  564(b)(1) of the Act, 21 U.S.C. section 360bbb-3(b)(1), unless the authorization is terminated or revoked.  Performed at [facility], [address], [place], [place] [address]     Radiology Studies: DG Chest 1 View  Result Date: 10/26/2020 CLINICAL DATA:  Leg swelling, shortness of breath, negative COVID EXAM: CHEST  1 VIEW COMPARISON:  10/22/2020 FINDINGS: Mild cardiomegaly and pulmonary vascular prominence. No overt airspace opacity. The visualized skeletal structures are unremarkable. IMPRESSION: Mild cardiomegaly and pulmonary vascular prominence. No overt pulmonary edema or other airspace opacity. Electronically Signed   By: Eddie Candle M.D.   On: 10/26/2020 13:12   ECHOCARDIOGRAM COMPLETE  Result Date: 10/26/2020    ECHOCARDIOGRAM REPORT   Patient Name:   Dustin Cruz Date of Exam: 10/26/2020 Medical Rec #:  [id number]    Height:       74.0 in Accession #:    [id number]   Weight:       257.0 lb Date of Birth:  1966/02/09     BSA:          2.418 m Patient Age:    14 years  BP:           137/88 mmHg Patient Gender: M            HR:           50 bpm. Exam Location:  [facility] Procedure: 2D Echo, Cardiac Doppler and Color Doppler Indications:    Abnormal ECG R94.31  History:        Patient has prior history of Echocardiogram examinations, most                 recent 07/24/2020. CHF, CAD, COPD; Risk Factors:Hypertension,                 Diabetes, Dyslipidemia and Current Smoker.  Sonographer:    Alvino Chapel RCS Referring Phys: [id number] [place]  1. Left ventricular ejection fraction, by estimation, is 35 to 40%. The left ventricle has moderately decreased function. The left ventricle demonstrates global hypokinesis. There is mild left ventricular hypertrophy. Left ventricular diastolic parameters are indeterminate.  2. Right ventricular systolic function is mildly reduced. The right ventricular size is moderately enlarged. There is moderately elevated pulmonary artery systolic  pressure.  3. The mitral valve is normal in structure. Mild mitral valve regurgitation. No evidence of mitral stenosis.  4. The aortic valve is tricuspid. There is mild calcification of the aortic valve. There is mild thickening of the aortic valve. Aortic valve regurgitation is not visualized. No aortic stenosis is present.  5. Moderate pulmonary HTN, PASP is 54 mmHg.  6. The inferior vena cava is dilated in size with <50% respiratory variability, suggesting right atrial pressure of 15 mmHg. FINDINGS  Left Ventricle: Left ventricular ejection fraction, by estimation, is 35 to 40%. The left ventricle has moderately decreased function. The left ventricle demonstrates global hypokinesis. The left ventricular internal cavity size was normal in size. There is mild left ventricular hypertrophy. Left ventricular diastolic parameters are indeterminate. Right Ventricle: The right ventricular size is moderately enlarged. No increase in right ventricular wall thickness. Right ventricular systolic function is mildly reduced. There is moderately elevated pulmonary artery systolic pressure. The tricuspid regurgitant velocity is 3.14 m/s, and with an assumed right atrial pressure of 15 mmHg, the estimated right ventricular systolic pressure is 96.2 mmHg. Left Atrium: Left atrial size was normal in size. Right Atrium: Right atrial size was normal in size. Pericardium: Trivial pericardial effusion is present. Mitral Valve: The mitral valve is normal in structure. Mild mitral valve regurgitation. No evidence of mitral valve stenosis. Tricuspid Valve: The tricuspid valve is normal in structure. Tricuspid valve regurgitation is mild . No evidence of tricuspid stenosis. Aortic Valve: The aortic valve is tricuspid. There is mild calcification of the aortic valve. There is mild thickening of the aortic valve. There is mild aortic valve annular calcification. Aortic valve regurgitation is not visualized. No aortic stenosis  is present.  Pulmonic Valve: The pulmonic valve was not well visualized. Pulmonic valve regurgitation is not visualized. No evidence of pulmonic stenosis. Aorta: The aortic root is normal in size and structure. Pulmonary Artery: Moderate pulmonary HTN, PASP is 54 mmHg. Venous: The inferior vena cava is dilated in size with less than 50% respiratory variability, suggesting right atrial pressure of 15 mmHg. IAS/Shunts: No atrial level shunt detected by color flow Doppler.  LEFT VENTRICLE PLAX 2D LVIDd:         4.90 cm LVIDs:         4.50 cm LV PW:  1.30 cm LV IVS:        1.10 cm LVOT diam:     1.90 cm LV SV:         46 LV SV Index:   19 LVOT Area:     2.84 cm  LV Volumes (MOD) LV vol d, MOD A2C: 90.9 ml LV vol d, MOD A4C: 93.3 ml LV vol s, MOD A2C: 69.6 ml LV vol s, MOD A4C: 50.7 ml LV SV MOD A2C:     21.3 ml LV SV MOD A4C:     93.3 ml LV SV MOD BP:      32.4 ml RIGHT VENTRICLE TAPSE (M-mode): 1.4 cm LEFT ATRIUM             Index       RIGHT ATRIUM           Index LA diam:        3.00 cm 1.24 cm/m  RA Area:     20.20 cm LA Vol (A2C):   74.2 ml 30.69 ml/m RA Volume:   65.80 ml  27.21 ml/m LA Vol (A4C):   50.6 ml 20.93 ml/m LA Biplane Vol: 62.1 ml 25.68 ml/m  AORTIC VALVE LVOT Vmax:   98.30 cm/s LVOT Vmean:  66.100 cm/s LVOT VTI:    0.164 m  AORTA Ao Root diam: 3.30 cm MITRAL VALVE                TRICUSPID VALVE MV Area (PHT): 6.17 cm     TR Peak grad:   39.4 mmHg MV Decel Time: 123 msec     TR Vmax:        314.00 cm/s MV E velocity: 138.00 cm/s                             SHUNTS                             Systemic VTI:  0.16 m                             Systemic Diam: 1.90 cm Carlyle Dolly MD Electronically signed by Carlyle Dolly MD Signature Date/Time: 10/26/2020/4:43:29 PM    Final    Scheduled Meds: . apixaban  5 mg Oral BID  . aspirin EC  81 mg Oral Q breakfast  . atorvastatin  80 mg Oral Daily  . busPIRone  7.5 mg Oral BID  . famotidine  20 mg Oral BID  . furosemide  80 mg Intravenous Q8H  .  gabapentin  300 mg Oral TID  . insulin aspart  0-5 Units Subcutaneous QHS  . insulin aspart  0-9 Units Subcutaneous TID WC  . insulin glargine  14 Units Subcutaneous QPM  . melatonin  6 mg Oral QHS  . metoprolol succinate  12.5 mg Oral Daily  . mometasone-formoterol  2 puff Inhalation BID  . nystatin   Topical BID  . potassium chloride  10 mEq Oral BID  . sodium chloride flush  3 mL Intravenous Q12H  . traZODone  150 mg Oral QHS   Continuous Infusions: . sodium chloride       LOS: 5 days   Time spent: 35 minutes  Cinde Ebert Wynetta Emery, MD How to contact the [facility] Attending or Consulting provider [place] or covering provider during after hours [place],  for this patient?  1. Check the care team in [facility] and look for a) attending/consulting [facility] provider listed and b) the [facility] team listed 2. Log into [email or web address] and use [place]'s universal password to access. If you do not have the password, please contact the hospital operator. 3. Locate the [facility] provider you are looking for under [facility] and page to a number that you can be directly reached. 4. If you still have difficulty reaching the provider, please page the [facility] (Director on Call) for the Hospitalists listed on amion for assistance.  10/27/2020, 4:24 PM

## 2020-10-28 DIAGNOSIS — R601 Generalized edema: Secondary | ICD-10-CM

## 2020-10-28 DIAGNOSIS — I5021 Acute systolic (congestive) heart failure: Secondary | ICD-10-CM

## 2020-10-28 LAB — CBC
HCT: 32.9 % — ABNORMAL LOW (ref 39.0–52.0)
Hemoglobin: 10.4 g/dL — ABNORMAL LOW (ref 13.0–17.0)
MCH: 29.8 pg (ref 26.0–34.0)
MCHC: 31.6 g/dL (ref 30.0–36.0)
MCV: 94.3 fL (ref 80.0–100.0)
Platelets: 405 10*3/uL — ABNORMAL HIGH (ref 150–400)
RBC: 3.49 MIL/uL — ABNORMAL LOW (ref 4.22–5.81)
RDW: 15.5 % (ref 11.5–15.5)
WBC: 7.9 10*3/uL (ref 4.0–10.5)
nRBC: 0 % (ref 0.0–0.2)

## 2020-10-28 LAB — BASIC METABOLIC PANEL
Anion gap: 9 (ref 5–15)
BUN: 41 mg/dL — ABNORMAL HIGH (ref 6–20)
CO2: 29 mmol/L (ref 22–32)
Calcium: 8.2 mg/dL — ABNORMAL LOW (ref 8.9–10.3)
Chloride: 91 mmol/L — ABNORMAL LOW (ref 98–111)
Creatinine, Ser: 1.54 mg/dL — ABNORMAL HIGH (ref 0.61–1.24)
GFR, Estimated: 53 mL/min — ABNORMAL LOW (ref >60)
Glucose, Bld: 267 mg/dL — ABNORMAL HIGH (ref 70–99)
Potassium: 4.3 mmol/L (ref 3.5–5.1)
Sodium: 129 mmol/L — ABNORMAL LOW (ref 135–145)

## 2020-10-28 LAB — MAGNESIUM: Magnesium: 2 mg/dL (ref 1.7–2.4)

## 2020-10-28 LAB — GLUCOSE, CAPILLARY
Glucose-Capillary: 181 mg/dL — ABNORMAL HIGH (ref 70–99)
Glucose-Capillary: 284 mg/dL — ABNORMAL HIGH (ref 70–99)
Glucose-Capillary: 243 mg/dL — ABNORMAL HIGH (ref 70–99)
Glucose-Capillary: 213 mg/dL — ABNORMAL HIGH (ref 70–99)

## 2020-10-28 MED ORDER — MAGNESIUM SULFATE 2 GM/50ML IV SOLN
2.0000 g | Freq: Once | INTRAVENOUS | Status: AC
  Administered 2020-10-28: 2 g via INTRAVENOUS
  Filled 2020-10-28: qty 50

## 2020-10-28 MED ORDER — INSULIN GLARGINE 100 UNIT/ML ~~LOC~~ SOLN
15.0000 [IU] | Freq: Every evening | SUBCUTANEOUS | Status: DC
  Filled 2020-10-28 (×3): qty 0.15

## 2020-10-28 MED ORDER — ALBUMIN HUMAN 25 % IV SOLN
25.0000 g | Freq: Four times a day (QID) | INTRAVENOUS | Status: AC
  Administered 2020-10-28 – 2020-10-29 (×3): 25 g via INTRAVENOUS
  Filled 2020-10-28 (×3): qty 100

## 2020-10-28 NOTE — Progress Notes (Signed)
Inpatient Diabetes Program Recommendations  AACE/ADA: New Consensus Statement on Inpatient Glycemic Control  Target Ranges:  Prepandial:   less than 140 mg/dL      Peak postprandial:   less than 180 mg/dL (1-2 hours)      Critically ill patients:  140 - 180 mg/dL  Results for Dustin Cruz, Dustin Cruz (MRN [id number]) as of 10/28/2020 11:39  Ref. Range 10/27/2020 07:08 10/27/2020 07:39 10/27/2020 11:05 10/27/2020 16:11 10/28/2020 08:20 10/28/2020 11:18  Glucose-Capillary Latest Ref Range: 70 - 99 mg/dL 54 (L) 87 203 (H) 227 (H) 181 (H) 284 (H)    Review of Glycemic Control  Diabetes history: DM2 Outpatient Diabetes medications: Lantus 40 units QPM, Metformin 500 mg BID Current orders for Inpatient glycemic control: Lantus 12 units QPM, Novolog 0-9 units TID with meals, Novolog 0-5 units QHS  Inpatient Diabetes Program Recommendations:    Insulin: If post prandial glucose remains over 180 mg/dl, please consider ordering Novolog 3 units TID with meals for meal coverage if patient eats at least 50% of meals.  Thanks, Barnie Alderman, RN, MSN, CDE Diabetes Coordinator Inpatient Diabetes Program [phone] (Team Pager from 8am to 5pm)

## 2020-10-28 NOTE — Progress Notes (Signed)
PROGRESS NOTE    Dustin Cruz  [id number] DOB: [birth year] DOA: 10/22/2020 PCP: Dustin Norlander, DO   Brief Narrative:  Per HPI: Dustin Steffy Packis a 55 y.o.malewith medical history significant forCAD, type 2 diabetes, diastolic heart failure, GERD, and COPD who presented to the ED with worsening lower extremity edema as well as abdominal edema and scrotal edema as well as some weakness. He is currently homeless and lives in his car. He states that he had run out of his Lasix as well as his diabetes medication for the last few days. It appears that on last admission on 09/2020 that he left AMA as he was being treated for edema at that time. He denies any fever, chills, abdominal pain, nausea, vomiting, chest pain, or other concerns.  -Patient has been admitted with significant volume overload/anasarca in the setting of acute on chronic diastolic CHF exacerbation with poor compliance on home medications.  He was also noted to have some initial hypoglycemia that has now resolved.  Assessment & Plan:   Active Problems:   Acute on chronic diastolic (congestive) heart failure (HCC)   Significant volume overload with anasarca and acute on chronic systolic CHF exacerbation-improving -Secondary to noncompliance with home medications -Last 2D echocardiogram performed on10/16/2021 with LVEF 60-65% and grade 3 diastolic dysfunction, repeated Echo 1/18 with LVEF 35-40% -Continued here on Lasix IV 80 mgnow three timesdaily, for goal diuresis of 2-3L daily, difficult to evaluate given scrotal edema with frequent urination over the floor per nursing staff.  Could not place Foley catheter -Currently has male purewick catheter with a recorded 1900 cc of urine output yesterday -Resumed home metoprolol with elevated HR as noted below -Entresto currently on hold due to fluctuating creatinine -Monitor daily weights and strict I's and O's -Hypoalbuminemia may also be playing a role in overall  edema, started on albumin infusions with Lasix -Will consult cardiology due to drop in EF and need for possible further ischemic work-up -Cardiac diet with fluid restriction -Wound care to lower extremities -PT evaluation with recommendations for skilled nursing facility -Repeat AM labs  Filed Weights   10/25/20 0429 10/26/20 0618 10/26/20 0837  Weight: 114.5 kg 117.3 kg 116.6 kg    Mild hypoxemia -Patient became more dyspneic with some mild hypoxemia noted on 1/18, no chest pain -Chest x-ray with cardiomegaly and pulmonary vascular congestion, but no frank edema -EKG with some noted bigeminy and tachycardia, repeat EKG in am -Resume home metoprolol and order IV metoprolol as needed for heart rate control and monitor on telemetry -Continue diuresis as ordered  Hyperglycemia in the setting of type 2 diabetes-improved -Noncompliant with home medications -Blood sugars elevated today -Increase Lantus to 15 units, continue sliding scale -Recent hemoglobin A1c of 14%  Dyslipidemia -Continue Lipitor  CAD with prior stent placement -Continue on aspirin, Eliquis, and Lipitor -Resume home b-blocker -Monitor on telemetry  GERD -Famotidine  COPD -No acute bronchospasms currently noted -Continue Dulera -Duo nebs with Xopenex ordered as needed shortness of breath or wheezing  History of medication noncompliance and leaving AMA -PT/TOC evaluation for needs on dc  Protein calorie malnutrition -Dietitian consultation   DVT prophylaxis:Eliquis Code Status:Full Family Communication:  Updated patient's POA, Mrs. Dustin Cruz over the phone 1/20 Disposition Plan: Status is: Inpatient  Remains inpatient appropriate because:IV treatments appropriate due to intensity of illness or inability to take PO and Inpatient level of care appropriate due to severity of illness  Dispo: The patient is from:Home Anticipated d/c is to:SNF Anticipated d/c date  is:2-3days Patient currently is not medically stable to d/c.Pt requires ongoing IV diuresis due to volume overload and mild hypoxemia.  Need to monitor heart rate control on telemetry as well.   Consultants:  None  Procedures:  See below  Antimicrobials:  None  Subjective: Patient continues to complain of significant edema.  He does endorse orthopnea.  He is complaining about the fluid restriction  Objective: Vitals:   10/27/20 2333 10/27/20 2340 10/28/20 0643 10/28/20 0953  BP: 139/79 125/69 131/83   Pulse: (!) 55 (!) 52 92   Resp: 18 18 20    Temp:   98.7 F (37.1 C)   TempSrc:   Oral   SpO2: 98% 100% 99% 98%  Weight:      Height:        Intake/Output Summary (Last 24 hours) at 10/28/2020 1740 Last data filed at 10/28/2020 1100 Gross per 24 hour  Intake 173 ml  Output 2025 ml  Net -1852 ml   Filed Weights   10/25/20 0429 10/26/20 0618 10/26/20 0837  Weight: 114.5 kg 117.3 kg 116.6 kg    Examination:  General exam: Alert, awake, oriented x 3 Respiratory system: Clear to auscultation. Respiratory effort normal. Cardiovascular system:RRR. No murmurs, rubs, gallops. Gastrointestinal system: Abdomen is nondistended, soft and nontender. No organomegaly or masses felt. Normal bowel sounds heard. Central nervous system: Alert and oriented. No focal neurological deficits. Extremities: 2-3+ pitting edema/anasarca/scrotal edema Skin: No rashes, lesions or ulcers Psychiatry: Judgement and insight appear normal. Mood & affect appropriate.    Data Reviewed: I have personally reviewed following labs and imaging studies  CBC: Recent Labs  Lab 10/22/20 0910 10/23/20 0610 10/24/20 0500 10/26/20 0613 10/27/20 0357 10/28/20 0032  WBC 6.6 9.3 9.0 8.7 7.4 7.9  NEUTROABS 4.8  --   --   --   --   --   HGB 12.1* 11.7* 11.4* 10.9* 10.6* 10.4*  HCT 37.9* 37.4* 36.0* 34.7* 34.0* 32.9*  MCV 94.0 94.9 93.3 93.5 94.2 94.3  PLT 424* 479* 482* 436* 448*  981*   Basic Metabolic Panel: Recent Labs  Lab 10/24/20 0500 10/25/20 0410 10/26/20 0613 10/27/20 0357 10/28/20 0032  NA 133* 134* 132* 134* 129*  K 3.5 3.8 4.1 4.2 4.3  CL 92* 92* 91* 92* 91*  CO2 32 33* 31 31 29   GLUCOSE 63* 38* 73 25* 267*  BUN 24* 28* 31* 36* 41*  CREATININE 1.29* 1.34* 1.44* 1.43* 1.54*  CALCIUM 8.0* 8.0* 8.2* 8.5* 8.2*  MG 1.9 2.0 1.9 2.0 2.0   GFR: Estimated Creatinine Clearance: 73.6 mL/min (A) (by C-G formula based on SCr of 1.54 mg/dL (H)). Liver Function Tests: Recent Labs  Lab 10/22/20 0910 10/23/20 0610  AST 32 31  ALT 33 29  ALKPHOS 135* 113  BILITOT 0.8 0.8  PROT 6.0* 5.3*  ALBUMIN 2.0* 1.7*   No results for input(s): LIPASE, AMYLASE in the last 168 hours. Recent Labs  Lab 10/25/20 0720  AMMONIA 25   Coagulation Profile: No results for input(s): INR, PROTIME in the last 168 hours. Cardiac Enzymes: No results for input(s): CKTOTAL, CKMB, CKMBINDEX, TROPONINI in the last 168 hours. BNP (last 3 results) No results for input(s): PROBNP in the last 8760 hours. HbA1C: No results for input(s): HGBA1C in the last 72 hours. CBG: Recent Labs  Lab 10/27/20 1105 10/27/20 1611 10/28/20 0820 10/28/20 1118 10/28/20 1646  GLUCAP 203* 227* 181* 284* 243*   Lipid Profile: No results for input(s): CHOL, HDL, LDLCALC, TRIG, CHOLHDL, LDLDIRECT  in the last 72 hours. Thyroid Function Tests: No results for input(s): TSH, T4TOTAL, FREET4, T3FREE, THYROIDAB in the last 72 hours. Anemia Panel: No results for input(s): VITAMINB12, FOLATE, FERRITIN, TIBC, IRON, RETICCTPCT in the last 72 hours. Sepsis Labs: No results for input(s): PROCALCITON, LATICACIDVEN in the last 168 hours.  Recent Results (from the past 240 hour(s))  Resp Panel by RT-PCR (Flu A&B, Covid) Nasopharyngeal Swab     Status: None   Collection Time: 10/22/20 10:42 AM   Specimen: Nasopharyngeal Swab; Nasopharyngeal(NP) swabs in vial transport medium  Result Value Ref Range  Status   SARS Coronavirus 2 by RT PCR NEGATIVE NEGATIVE Final    Comment: (NOTE) SARS-CoV-2 target nucleic acids are NOT DETECTED.  The SARS-CoV-2 RNA is generally detectable in upper respiratory specimens during the acute phase of infection. The lowest concentration of SARS-CoV-2 viral copies this assay can detect is 138 copies/mL. A negative result does not preclude SARS-Cov-2 infection and should not be used as the sole basis for treatment or other patient management decisions. A negative result may occur with  improper specimen collection/handling, submission of specimen other than nasopharyngeal swab, presence of viral mutation(s) within the areas targeted by this assay, and inadequate number of viral copies(<138 copies/mL). A negative result must be combined with clinical observations, patient history, and epidemiological information. The expected result is Negative.  Fact Sheet for Patients:  EntrepreneurPulse.com.au  Fact Sheet for Healthcare Providers:  IncredibleEmployment.be  This test is no t yet approved or cleared by the [place] FDA and  has been authorized for detection and/or diagnosis of SARS-CoV-2 by FDA under an Emergency Use Authorization (EUA). This EUA will remain  in effect (meaning this test can be used) for the duration of the COVID-19 declaration under Section 564(b)(1) of the Act, 21 U.S.C.section 360bbb-3(b)(1), unless the authorization is terminated  or revoked sooner.       Influenza A by PCR NEGATIVE NEGATIVE Final   Influenza B by PCR NEGATIVE NEGATIVE Final    Comment: (NOTE) The Xpert Xpress SARS-CoV-2/FLU/RSV plus assay is intended as an aid in the diagnosis of influenza from Nasopharyngeal swab specimens and should not be used as a sole basis for treatment. Nasal washings and aspirates are unacceptable for Xpert Xpress SARS-CoV-2/FLU/RSV testing.  Fact Sheet for  Patients: EntrepreneurPulse.com.au  Fact Sheet for Healthcare Providers: IncredibleEmployment.be  This test is not yet approved or cleared by the [place] FDA and has been authorized for detection and/or diagnosis of SARS-CoV-2 by FDA under an Emergency Use Authorization (EUA). This EUA will remain in effect (meaning this test can be used) for the duration of the COVID-19 declaration under Section 564(b)(1) of the Act, 21 U.S.C. section 360bbb-3(b)(1), unless the authorization is terminated or revoked.  Performed at [facility], [address], [place], [place] [address]     Radiology Studies: No results found. Scheduled Meds: . apixaban  5 mg Oral BID  . aspirin EC  81 mg Oral Q breakfast  . atorvastatin  80 mg Oral Daily  . busPIRone  7.5 mg Oral BID  . famotidine  20 mg Oral BID  . furosemide  80 mg Intravenous Q8H  . gabapentin  300 mg Oral TID  . insulin aspart  0-5 Units Subcutaneous QHS  . insulin aspart  0-9 Units Subcutaneous TID WC  . insulin glargine  12 Units Subcutaneous QPM  . melatonin  6 mg Oral QHS  . metoprolol succinate  12.5 mg Oral Daily  . mometasone-formoterol  2 puff Inhalation BID  . nystatin   Topical BID  . potassium chloride  10 mEq Oral BID  . sodium chloride flush  3 mL Intravenous Q12H  . traZODone  150 mg Oral QHS   Continuous Infusions: . sodium chloride    . albumin human       LOS: 6 days   Time spent: 35 minutes  Kathie Dike, MD How to contact the [facility] Attending or Consulting provider [place] or covering provider during after hours [place], for this patient?  1. Check the care team in [facility] and look for a) attending/consulting TRH provider listed and b) the [facility] team listed 2. Log into [email or web address] and use [place]'s universal password to access. If you do not have the password, please contact the hospital operator. 3. Locate the [facility] provider you are looking for under [facility]  and page to a number that you can be directly reached. 4. If you still have difficulty reaching the provider, please page the [facility] (Director on Call) for the Hospitalists listed on amion for assistance.  10/28/2020, 5:40 PM

## 2020-10-29 DIAGNOSIS — I2510 Atherosclerotic heart disease of native coronary artery without angina pectoris: Secondary | ICD-10-CM | POA: Diagnosis not present

## 2020-10-29 DIAGNOSIS — N183 Chronic kidney disease, stage 3 unspecified: Secondary | ICD-10-CM

## 2020-10-29 DIAGNOSIS — Z86718 Personal history of other venous thrombosis and embolism: Secondary | ICD-10-CM

## 2020-10-29 DIAGNOSIS — I10 Essential (primary) hypertension: Secondary | ICD-10-CM | POA: Diagnosis not present

## 2020-10-29 DIAGNOSIS — I5033 Acute on chronic diastolic (congestive) heart failure: Secondary | ICD-10-CM | POA: Diagnosis not present

## 2020-10-29 DIAGNOSIS — R000 Tachycardia, unspecified: Secondary | ICD-10-CM

## 2020-10-29 DIAGNOSIS — I5043 Acute on chronic combined systolic (congestive) and diastolic (congestive) heart failure: Secondary | ICD-10-CM | POA: Diagnosis not present

## 2020-10-29 LAB — COMPREHENSIVE METABOLIC PANEL
ALT: 47 U/L — ABNORMAL HIGH (ref 0–44)
AST: 50 U/L — ABNORMAL HIGH (ref 15–41)
Albumin: 2.3 g/dL — ABNORMAL LOW (ref 3.5–5.0)
Alkaline Phosphatase: 118 U/L (ref 38–126)
Anion gap: 10 (ref 5–15)
BUN: 39 mg/dL — ABNORMAL HIGH (ref 6–20)
CO2: 30 mmol/L (ref 22–32)
Calcium: 8.4 mg/dL — ABNORMAL LOW (ref 8.9–10.3)
Chloride: 92 mmol/L — ABNORMAL LOW (ref 98–111)
Creatinine, Ser: 1.47 mg/dL — ABNORMAL HIGH (ref 0.61–1.24)
GFR, Estimated: 56 mL/min — ABNORMAL LOW (ref >60)
Glucose, Bld: 98 mg/dL (ref 70–99)
Potassium: 4.2 mmol/L (ref 3.5–5.1)
Sodium: 132 mmol/L — ABNORMAL LOW (ref 135–145)
Total Bilirubin: 0.6 mg/dL (ref 0.3–1.2)
Total Protein: 6.4 g/dL — ABNORMAL LOW (ref 6.5–8.1)

## 2020-10-29 LAB — GLUCOSE, CAPILLARY
Glucose-Capillary: 297 mg/dL — ABNORMAL HIGH (ref 70–99)
Glucose-Capillary: 222 mg/dL — ABNORMAL HIGH (ref 70–99)
Glucose-Capillary: 62 mg/dL — ABNORMAL LOW (ref 70–99)
Glucose-Capillary: 84 mg/dL (ref 70–99)
Glucose-Capillary: 287 mg/dL — ABNORMAL HIGH (ref 70–99)
Glucose-Capillary: 243 mg/dL — ABNORMAL HIGH (ref 70–99)
Glucose-Capillary: 259 mg/dL — ABNORMAL HIGH (ref 70–99)

## 2020-10-29 MED ORDER — INSULIN GLARGINE 100 UNIT/ML ~~LOC~~ SOLN
10.0000 [IU] | Freq: Every evening | SUBCUTANEOUS | Status: DC
  Administered 2020-10-29 – 2020-10-30 (×2): 10 [IU] via SUBCUTANEOUS
  Filled 2020-10-29 (×3): qty 0.1

## 2020-10-29 MED ORDER — DEXTROSE 50 % IV SOLN
INTRAVENOUS | Status: AC
  Filled 2020-10-29: qty 50

## 2020-10-29 MED ORDER — METOPROLOL SUCCINATE ER 25 MG PO TB24
25.0000 mg | ORAL_TABLET | Freq: Every day | ORAL | Status: DC
  Administered 2020-10-30 – 2020-11-18 (×19): 25 mg via ORAL
  Filled 2020-10-29 (×20): qty 1

## 2020-10-29 MED ORDER — SACUBITRIL-VALSARTAN 24-26 MG PO TABS
1.0000 | ORAL_TABLET | Freq: Two times a day (BID) | ORAL | Status: DC
  Administered 2020-10-29 – 2020-11-18 (×41): 1 via ORAL
  Filled 2020-10-29 (×41): qty 1

## 2020-10-29 NOTE — Progress Notes (Addendum)
PROGRESS NOTE    Dustin Cruz  RFF:[id number] DOB: [birth year] DOA: 10/22/2020 PCP: [name], DO   Brief Narrative:  Per HPI: Dustin Cruz a 55 y.o.malewith medical history significant forCAD, type 2 diabetes, diastolic heart failure, GERD, and COPD who presented to the ED with worsening lower extremity edema as well as abdominal edema and scrotal edema as well as some weakness. He is currently homeless and lives in his car. He states that he had run out of his Lasix as well as his diabetes medication for the last few days. It appears that on last admission on 09/2020 that he left AMA as he was being treated for edema at that time. He denies any fever, chills, abdominal pain, nausea, vomiting, chest pain, or other concerns.  -Patient has been admitted with significant volume overload/anasarca in the setting of acute on chronic diastolic CHF exacerbation with poor compliance on home medications.  He was also noted to have some initial hypoglycemia that has now resolved.  Assessment & Plan:   Active Problems:   Acute on chronic diastolic (congestive) heart failure (HCC)   Significant volume overload with anasarca and acute on chronic systolic CHF exacerbation-improving -Secondary to noncompliance with home medications -Last 2D echocardiogram performed on10/16/2021 with LVEF 60-65% and grade 3 diastolic dysfunction, repeated Echo 1/18 with LVEF 35-40% -Continued here on Lasix IV 80 mgnow three timesdaily, for goal diuresis of 2-3L daily, difficult to evaluate given scrotal edema with frequent urination over the floor per nursing staff.  Could not place Foley catheter due to significant penile edema.  He does not allow nurses to record output. -Monitor daily weights and strict I's and O's -Hypoalbuminemia may also be playing a role in overall edema.  He received albumin infusion 10/28/2020. -Cardiac diet with fluid restriction -Wound care to lower extremities -PT  evaluation with recommendations for skilled nursing facility Now cardiology on board.  Entresto on hold.  Defer further management to cardiology.  Filed Weights   10/26/20 0618 10/26/20 0837 10/29/20 1000  Weight: 117.3 kg 116.6 kg 115.4 kg    Mild hypoxemia -Patient became more dyspneic with some mild hypoxemia noted on 1/18, no chest pain -Chest x-ray with cardiomegaly and pulmonary vascular congestion, but no frank edema -EKG with some noted bigeminy and tachycardia, repeat EKG in am He is comfortable without any dyspnea and is not hypoxic.  Continue diuresis.  Hyperglycemia in the setting of type 2 diabetes-improved -Noncompliant with home medications Had hypoglycemia this morning.  Will back down Lantus to 10 units and continue SSI.  Dyslipidemia -Continue Lipitor  CAD with prior stent placement -Continue on aspirin, Eliquis, and Lipitor -Resume home b-blocker -Monitor on telemetry. Management per cardiology.  GERD -Famotidine  COPD -No acute bronchospasms currently noted -Continue Dulera -Duo nebs with Xopenex ordered as needed shortness of breath or wheezing  History of medication noncompliance and leaving AMA -PT/TOC evaluation for needs on dc  Protein calorie malnutrition -Dietitian consultation  CKD stage IIIa: At baseline.  Monitor.  DVT prophylaxis:Eliquis Code Status:Full Family Communication:  None at bedside. Disposition Plan: Status is: Inpatient  Remains inpatient appropriate because:IV treatments appropriate due to intensity of illness or inability to take PO and Inpatient level of care appropriate due to severity of illness  Dispo: The patient is from:Home Anticipated d/c is to:SNF Anticipated d/c date is:2-3days Patient currently is not medically stable to d/c.Pt requires ongoing IV diuresis due to volume overload and mild hypoxemia.  Need to monitor heart rate control  on telemetry as  well.   Consultants:  None  Procedures:  See below  Antimicrobials:  None  Subjective: Seen and examined.  No complaints, denied any shortness of breath.  Has significant scrotal and penile edema that he wants to get back to normal.  Objective: Vitals:   10/28/20 2019 10/29/20 0627 10/29/20 0737 10/29/20 1000  BP: (!) 140/94 125/87    Pulse: 88 90    Resp: 20 20    Temp: 98.6 F (37 C) 98.1 F (36.7 C)    TempSrc: Oral Oral    SpO2: 96% 96% 95%   Weight:    115.4 kg  Height:        Intake/Output Summary (Last 24 hours) at 10/29/2020 1110 Last data filed at 10/29/2020 0900 Gross per 24 hour  Intake 630.88 ml  Output 600 ml  Net 30.88 ml   Filed Weights   10/26/20 0618 10/26/20 0837 10/29/20 1000  Weight: 117.3 kg 116.6 kg 115.4 kg    Examination:  General exam: Appears calm and comfortable  Respiratory system: Clear to auscultation. Respiratory effort normal. Cardiovascular system: S1 & S2 heard, RRR. No JVD, murmurs, rubs, gallops or clicks.  +2 pitting edema bilateral lower extremity Gastrointestinal system: Abdomen is nondistended, soft and nontender. No organomegaly or masses felt. Normal bowel sounds heard.  Moderate to severe penile and scrotal edema Central nervous system: Alert and oriented. No focal neurological deficits. Extremities: Symmetric 5 x 5 power. Skin: No rashes, lesions or ulcers.  Psychiatry: Judgement and insight appear normal. Mood & affect appropriate.   Data Reviewed: I have personally reviewed following labs and imaging studies  CBC: Recent Labs  Lab 10/23/20 0610 10/24/20 0500 10/26/20 0613 10/27/20 0357 10/28/20 0032  WBC 9.3 9.0 8.7 7.4 7.9  HGB 11.7* 11.4* 10.9* 10.6* 10.4*  HCT 37.4* 36.0* 34.7* 34.0* 32.9*  MCV 94.9 93.3 93.5 94.2 94.3  PLT 479* 482* 436* 448* [id number]*   Basic Metabolic Panel: Recent Labs  Lab 10/24/20 0500 10/25/20 0410 10/26/20 0613 10/27/20 0357 10/28/20 0032 10/29/20 0457  NA 133*  134* 132* 134* 129* 132*  K 3.5 3.8 4.1 4.2 4.3 4.2  CL 92* 92* 91* 92* 91* 92*  CO2 32 33* 31 31 29 30   GLUCOSE 63* 38* 73 25* 267* 98  BUN 24* 28* 31* 36* 41* 39*  CREATININE 1.29* 1.34* 1.44* 1.43* 1.54* 1.47*  CALCIUM 8.0* 8.0* 8.2* 8.5* 8.2* 8.4*  MG 1.9 2.0 1.9 2.0 2.0  --    GFR: Estimated Creatinine Clearance: 76.7 mL/min (A) (by C-G formula based on SCr of 1.47 mg/dL (H)). Liver Function Tests: Recent Labs  Lab 10/23/20 0610 10/29/20 0457  AST 31 50*  ALT 29 47*  ALKPHOS 113 118  BILITOT 0.8 0.6  PROT 5.3* 6.4*  ALBUMIN 1.7* 2.3*   No results for input(s): LIPASE, AMYLASE in the last 168 hours. Recent Labs  Lab 10/25/20 0720  AMMONIA 25   Coagulation Profile: No results for input(s): INR, PROTIME in the last 168 hours. Cardiac Enzymes: No results for input(s): CKTOTAL, CKMB, CKMBINDEX, TROPONINI in the last 168 hours. BNP (last 3 results) No results for input(s): PROBNP in the last 8760 hours. HbA1C: No results for input(s): HGBA1C in the last 72 hours. CBG: Recent Labs  Lab 10/28/20 1118 10/28/20 1646 10/28/20 2016 10/29/20 0357 10/29/20 0745  GLUCAP 284* 243* 213* 62* 84   Lipid Profile: No results for input(s): CHOL, HDL, LDLCALC, TRIG, CHOLHDL, LDLDIRECT in the last 72 hours.  Thyroid Function Tests: No results for input(s): TSH, T4TOTAL, FREET4, T3FREE, THYROIDAB in the last 72 hours. Anemia Panel: No results for input(s): VITAMINB12, FOLATE, FERRITIN, TIBC, IRON, RETICCTPCT in the last 72 hours. Sepsis Labs: No results for input(s): PROCALCITON, LATICACIDVEN in the last 168 hours.  Recent Results (from the past 240 hour(s))  Resp Panel by RT-PCR (Flu A&B, Covid) Nasopharyngeal Swab     Status: None   Collection Time: 10/22/20 10:42 AM   Specimen: Nasopharyngeal Swab; Nasopharyngeal(NP) swabs in vial transport medium  Result Value Ref Range Status   SARS Coronavirus 2 by RT PCR NEGATIVE NEGATIVE Final    Comment: (NOTE) SARS-CoV-2 target  nucleic acids are NOT DETECTED.  The SARS-CoV-2 RNA is generally detectable in upper respiratory specimens during the acute phase of infection. The lowest concentration of SARS-CoV-2 viral copies this assay can detect is 138 copies/mL. A negative result does not preclude SARS-Cov-2 infection and should not be used as the sole basis for treatment or other patient management decisions. A negative result may occur with  improper specimen collection/handling, submission of specimen other than nasopharyngeal swab, presence of viral mutation(s) within the areas targeted by this assay, and inadequate number of viral copies(<138 copies/mL). A negative result must be combined with clinical observations, patient history, and epidemiological information. The expected result is Negative.  Fact Sheet for Patients:  EntrepreneurPulse.com.au  Fact Sheet for Healthcare Providers:  IncredibleEmployment.be  This test is no t yet approved or cleared by the [place] FDA and  has been authorized for detection and/or diagnosis of SARS-CoV-2 by FDA under an Emergency Use Authorization (EUA). This EUA will remain  in effect (meaning this test can be used) for the duration of the COVID-19 declaration under Section 564(b)(1) of the Act, 21 U.S.C.section 360bbb-3(b)(1), unless the authorization is terminated  or revoked sooner.       Influenza A by PCR NEGATIVE NEGATIVE Final   Influenza B by PCR NEGATIVE NEGATIVE Final    Comment: (NOTE) The Xpert Xpress SARS-CoV-2/FLU/RSV plus assay is intended as an aid in the diagnosis of influenza from Nasopharyngeal swab specimens and should not be used as a sole basis for treatment. Nasal washings and aspirates are unacceptable for Xpert Xpress SARS-CoV-2/FLU/RSV testing.  Fact Sheet for Patients: EntrepreneurPulse.com.au  Fact Sheet for Healthcare  Providers: IncredibleEmployment.be  This test is not yet approved or cleared by the [place] FDA and has been authorized for detection and/or diagnosis of SARS-CoV-2 by FDA under an Emergency Use Authorization (EUA). This EUA will remain in effect (meaning this test can be used) for the duration of the COVID-19 declaration under Section 564(b)(1) of the Act, 21 U.S.C. section 360bbb-3(b)(1), unless the authorization is terminated or revoked.  Performed at [facility], [address], [place], [place] [address]     Radiology Studies: No results found. Scheduled Meds: . apixaban  5 mg Oral BID  . aspirin EC  81 mg Oral Q breakfast  . atorvastatin  80 mg Oral Daily  . busPIRone  7.5 mg Oral BID  . famotidine  20 mg Oral BID  . furosemide  80 mg Intravenous Q8H  . gabapentin  300 mg Oral TID  . insulin aspart  0-5 Units Subcutaneous QHS  . insulin aspart  0-9 Units Subcutaneous TID WC  . insulin glargine  10 Units Subcutaneous QPM  . melatonin  6 mg Oral QHS  . [START ON 10/30/2020] metoprolol succinate  25 mg Oral Daily  . mometasone-formoterol  2 puff  Inhalation BID  . nystatin   Topical BID  . potassium chloride  10 mEq Oral BID  . sacubitril-valsartan  1 tablet Oral BID  . sodium chloride flush  3 mL Intravenous Q12H  . traZODone  150 mg Oral QHS   Continuous Infusions: . sodium chloride    . albumin human 25 g (10/29/20 0612)     LOS: 7 days   Time spent: 36 minutes  Darliss Cheney, MD How to contact the [facility] Attending or Consulting provider [place] or covering provider during after hours [place], for this patient?  1. Check the care team in [facility] and look for a) attending/consulting TRH provider listed and b) the [facility] team listed 2. Log into [email or web address] and use [place]'s universal password to access. If you do not have the password, please contact the hospital operator. 3. Locate the [facility] provider you are looking for under [facility] and  page to a number that you can be directly reached. 4. If you still have difficulty reaching the provider, please page the [facility] (Director on Call) for the Hospitalists listed on amion for assistance.  10/29/2020, 11:10 AM

## 2020-10-29 NOTE — Care Management Important Message (Signed)
Important Message  Patient Details  Name: Dustin Cruz MRN: [id number] Date of Birth: 1966/06/12   Medicare Important Message Given:  Yes     Tommy Medal 10/29/2020, 2:29 PM

## 2020-10-29 NOTE — Progress Notes (Signed)
PT Cancellation Note  Patient Details Name: Dustin Cruz MRN: [id number] DOB: 1965/10/25   Cancelled Treatment:    Reason Eval/Treat Not Completed: Patient declined, no reason specified;Fatigue/lethargy limiting ability to participate Patient stated he was tired from fixing his blankets and asked if PT would return later as he did not want to participate at the current time. Will check back later if time permits.    10:57 AM, 10/29/20 Mearl Latin PT, DPT Physical Therapist at [facility]

## 2020-10-29 NOTE — Consult Note (Addendum)
Cardiology Consult    Patient ID: KOKI PILLADO; [id number]; 13-May-1966   Admit date: 10/22/2020 Date of Consult: 10/29/2020  Primary Care Provider: [name], DO Primary Cardiologist: Previously followed by [name], MD (Inactive)   Patient Profile    ANTE CANSLER is a 55 y.o. male with past medical history of CAD(s/p BMS to RI in 2012, 70-80% ISR by repeat cath in 2013 with medical management recommended due to small vessel size), chronic diastolic CHF, HTN, HLD, Type 2 DMand history of DVT (occurring in 03/2019), and noncompliance who is being seen today for the evaluation of worsening cardiomyopathy at the request of Dr. Doristine Bosworth.   History of Present Illness    Mr. Guderian was last examined in clinic in 07/2020 by myself and reported worsening dyspnea on exertion, orthopnea and lower extremity edema for the past several weeks despite Lasix being titrated by his PCP. He reported a baseline weight of 180 - 190 lbs and weight was at 222 lbs during his visit, therefore he was sent to the ED for admission given his significant volume overload. Unfortunately, he left AMA the following day. He was evaluated in the ED multiple times during 08/28/2020 and 09/27/2020 for recurrent volume overload but refused admission. Was admitted on 09/26/2020 but left AMA 2 days later. Presented back to the ED the day after leaving AMA and left AMA again after 1 day.  Most recently presented back to the ED on 10/22/2020 for volume overload along with variable blood sugar readings. Reported living out of his car by review of initial notes. Initial glucose was over 500 and BNP was elevated at 1662. He was initially receiving IV Lasix 80 mg twice daily and this has been titrated to 80 mg 3 times daily. A repeat echocardiogram was obtained which shows his EF is now reduced to 35 to 40% with global hypokinesis. He did have mild LVH, mildly reduced RV function, moderately elevated PASP at 54 mmHg and mild  MR.  Complete I's and O's have not been recorded this admission and a weight has not been obtained since 10/26/2020 that was at 257 lbs. Repeat labs for this AM are pending but his BMET on 1/20 showed Na+ was low at 129 with K+ at 4.3 and creatinine 1.54 (baseline 1.2 - 1.3, at 1.38 on admission).  In talking with the patient today, he reports he was living out of his car prior to admission and was not taking his medications. Says he had continued to experience dyspnea and lower extremity edema which progressed. No specific orthopnea or PND. No recent chest pain, palpitations, dizziness or presyncope. He is not pleased with hospital stay as he ordered a pizza and a 2L soda last night and they were not delivered to his room.    Past Medical History:  Diagnosis Date   Anxiety    Asthma    CKD stage 3 due to type 2 diabetes mellitus (HCC)    COPD (chronic obstructive pulmonary disease) ([place])    Coronary artery disease    s/p BMS to Ramus 10/2010;  Cath 01/22/12 prox 30-40% LAD, LCx ramus w/ hazy 70-80% in-stent restenosis, EF 60% treated medically   Coronary artery disease    s/p BMS to Ramus 10/2010;  Cath 01/22/12 prox 30-40% LAD, LCx ramus w/ hazy 70-80% in-stent restenosis, EF 60% treated medically    Depression    Diabetes mellitus    Type 2   Gastroparesis 05/2013   GERD (gastroesophageal  reflux disease)    HTN (hypertension)    Hyperlipidemia    Hyperlipidemia    Major depression, chronic 08/13/2012   Melanoma ([place]) 2007   surgery at [facility], Followed by [facility]   Myocardial infarction [facility]) 2012   Obesity    Tobacco abuse    Tubular adenoma    Urinary retention     Past Surgical History:  Procedure Laterality Date   BIOPSY  01/07/2018   Procedure: BIOPSY;  Surgeon: Daneil Dolin, MD;  Location: [facility];  Service: Endoscopy;;  duodenal and gastric biopsy   BIOPSY  04/25/2018   Procedure: BIOPSY;  Surgeon: Daneil Dolin, MD;  Location: [facility];   Service: Endoscopy;;  ascending and descending and sigmoid biopsies   CARDIAC CATHETERIZATION     with stent   COLONOSCOPY N/A 01/27/2013   NOI:BBCWUG and colonic polyps. Tubular adenomas, poor bowel prep, one-year follow-up surveillance colonoscopy recommended   COLONOSCOPY WITH PROPOFOL N/A 04/25/2018   Procedure: COLONOSCOPY WITH PROPOFOL;  Surgeon: Daneil Dolin, MD;  Location: [facility];  Service: Endoscopy;  Laterality: N/A;  11:00am - pt to be prepped INPT and labs to be done as well   CORONARY ANGIOPLASTY WITH STENT PLACEMENT     ESOPHAGOGASTRODUODENOSCOPY  04/24/2012   [place]-mild erosive reflux esophagitis,dilated w/58F Venia Minks, small HH, minimal chronic gastric/bulbar erosions(No H pylori)   ESOPHAGOGASTRODUODENOSCOPY (EGD) WITH PROPOFOL N/A 01/07/2018   Dr. Gala Romney: Erythematous mucosa in the stomach, retained gastric contents, incomplete exam.  Biopsied showed mild chronic gastritis but no H. pylori.  Duodenal biopsy was negative for celiac disease.   FLEXIBLE SIGMOIDOSCOPY N/A 01/07/2018   Dr. Gala Romney: Incomplete colonoscopy due to inadequate bowel prep   LEFT HEART CATHETERIZATION WITH CORONARY ANGIOGRAM N/A 01/22/2012   Procedure: LEFT HEART CATHETERIZATION WITH CORONARY ANGIOGRAM;  Surgeon: Jolaine Artist, MD;  [facility] C[facility] LAB;  Service: Cardiovascular;  Laterality: N/A;   melanoma surgery  2007   [place], removed lymph nodes under arm as well,Left abd     Home Medications:  Prior to Admission medications   Medication Sig Start Date End Date Taking? Authorizing Provider  albuterol (VENTOLIN HFA) 108 (90 Base) MCG/ACT inhaler INHALE 2 PUFFS BY MOUTH EVERY 6 HRS AS NEEDED 10/06/20  Yes Gottschalk, Ashly M, DO  furosemide (LASIX) 80 MG tablet Take 1 tablet (80 mg total) by mouth 2 (two) times daily. 89/1/69  Yes Delora Fuel, MD  insulin glargine (LANTUS SOLOSTAR) 100 UNIT/ML Solostar Pen Inject 60 Units into the skin every evening. Patient taking differently: Inject  40 Units into the skin every evening. 07/01/20  Yes Gottschalk, Ashly M, DO  acetaminophen (TYLENOL) 325 MG tablet Take 2 tablets (650 mg total) by mouth every 6 (six) hours as needed for mild pain, moderate pain or fever (or Fever >/= 101). Patient not taking: No sig reported 03/21/19   Roxan Hockey, MD  albuterol (PROVENTIL) (2.5 MG/3ML) 0.083% nebulizer solution Take 3 mLs (2.5 mg total) by nebulization every 6 (six) hours as needed for wheezing or shortness of breath. Dx---44.1 Patient not taking: No sig reported 02/16/20   [name], DO  aspirin EC 81 MG tablet Take 1 tablet (81 mg total) by mouth daily with breakfast. Patient not taking: Reported on 10/22/2020 03/21/19   Roxan Hockey, MD  atorvastatin (LIPITOR) 80 MG tablet Take 1 tablet (80 mg total) by mouth daily. 07/01/20   [name], DO  busPIRone (BUSPAR) 7.5 MG tablet Take 1 tablet by mouth  twice daily 09/06/20   Ronnie Doss M, DO  cephALEXin (KEFLEX) 250 MG capsule Take 1 capsule (250 mg total) by mouth 4 (four) times daily. Patient not taking: No sig reported 10/05/20   Hayden Rasmussen, MD  ELIQUIS 5 MG TABS tablet Take 1 tablet by mouth twice daily 09/22/20   Strader, [place] M, PA-C  ENTRESTO 24-26 MG Take 1 tablet by mouth 2 (two) times daily. 08/01/20   [provider]  famotidine (PEPCID) 20 MG tablet Take 20 mg by mouth 2 (two) times daily. Patient not taking: No sig reported 12/22/19   [provider]  gabapentin (NEURONTIN) 600 MG tablet Take 1 tablet (600 mg total) by mouth 3 (three) times daily. 10/07/20   [name], DO  lisinopril (ZESTRIL) 10 MG tablet Take 1 tablet (10 mg total) by mouth at bedtime. 07/01/20   [name], DO  metFORMIN (GLUCOPHAGE) 500 MG tablet Take 1 tablet (500 mg total) by mouth 2 (two) times daily with a meal. Patient not taking: No sig reported 11/12/19   Cassandria Anger, MD  metoprolol succinate (TOPROL-XL) 25 MG 24 hr tablet  Take 12.5 mg by mouth daily. 07/30/20   [provider]  mometasone-formoterol (DULERA) 200-5 MCG/ACT AERO Inhale 2 puffs into the lungs in the morning and at bedtime. 07/20/20   [name], DO  nitroGLYCERIN (NITROSTAT) 0.4 MG SL tablet Place 1 tablet (0.4 mg total) under the tongue every 5 (five) minutes as needed. For chest pain. Patient not taking: No sig reported 08/13/12   Reece Packer, NP  oxyCODONE (OXY IR/ROXICODONE) 5 MG immediate release tablet Take 5 mg by mouth 2 (two) times daily as needed. 09/26/20   [provider]  potassium chloride (KLOR-CON) 10 MEQ tablet Take 10 mEq by mouth 2 (two) times daily. 09/16/20   [provider]  tiZANidine (ZANAFLEX) 4 MG tablet Take 4-8 mg by mouth every 6 (six) hours as needed for muscle spasms. Patient not taking: No sig reported    [provider]  traZODone (DESYREL) 150 MG tablet TAKE 1 TABLET BY MOUTH ONCE DAILY AT BEDTIME Patient taking differently: Take 150 mg by mouth at bedtime. 08/31/20   [name], DO  metoprolol tartrate (LOPRESSOR) 25 MG tablet Take 25 mg by mouth 2 (two) times daily.    12/27/11  [provider]    Inpatient Medications: Scheduled Meds:  apixaban  5 mg Oral BID   aspirin EC  81 mg Oral Q breakfast   atorvastatin  80 mg Oral Daily   busPIRone  7.5 mg Oral BID   famotidine  20 mg Oral BID   furosemide  80 mg Intravenous Q8H   gabapentin  300 mg Oral TID   insulin aspart  0-5 Units Subcutaneous QHS   insulin aspart  0-9 Units Subcutaneous TID WC   insulin glargine  10 Units Subcutaneous QPM   melatonin  6 mg Oral QHS   metoprolol succinate  12.5 mg Oral Daily   mometasone-formoterol  2 puff Inhalation BID   nystatin   Topical BID   potassium chloride  10 mEq Oral BID   sodium chloride flush  3 mL Intravenous Q12H   traZODone  150 mg Oral QHS   Continuous Infusions:  sodium chloride     albumin human 25 g (10/29/20 0612)    PRN Meds: sodium chloride, acetaminophen **OR** acetaminophen, ipratropium-albuterol, levalbuterol, metoprolol tartrate, ondansetron **OR** ondansetron (ZOFRAN) IV, oxyCODONE, sodium chloride flush  Allergies:   No Known Allergies  Social History:   Social History   Socioeconomic History   Marital status: Divorced    Spouse name: rayann Fedora   Number of children: 0   Years of education: Not on file   Highest education level: Not on file  Occupational History   Occupation: disabled  Tobacco Use   Smoking status: Current Every Day Smoker    Packs/day: 0.50    Years: 31.00    [place] years: 15.50    Types: Cigarettes    Start date: 10/09/1981   Smokeless tobacco: Never Used   Tobacco comment: 1/2 Smartt daily 11/01/17  Vaping Use   Vaping Use: Never used  Substance and Sexual Activity   Alcohol use: No    Alcohol/week: 0.0 standard drinks    Comment: 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   Drug use: No   Sexual activity: Yes  Other Topics Concern   Not on file  Social History Narrative   Lives at home in an apartment alone. He is divorced and does not have any children. He has two brothers but is estranged from one. He only talks with the other one infrequently. He does have contact with his ex wife and they have a good relationship. They help each other out.       Patient stated that he was living with his brother and his brother "kicked him out" and he is currently homeless 10/23/2020   Social Determinants of Health   Financial Resource Strain: Not on file  Food Insecurity: Not on file  Transportation Needs: Not on file  Physical Activity: Not on file  Stress: Not on file  Social Connections: Not on file  Intimate Partner Violence: Not on file     Family History:    Family History  Problem Relation Age of Onset   Stroke Mother    Alcohol abuse Father    Heart disease Other    Other  Other        not real familiar with family history   Lung cancer Other    Colon cancer Neg Hx    Colon polyps Neg Hx       Review of Systems    General:  No chills, fever, night sweats or weight changes.  Cardiovascular:  No chest pain, orthopnea, palpitations, paroxysmal nocturnal dyspnea. Positive for dyspnea on exertion and edema.  Dermatological: No rash, lesions/masses Respiratory: No cough, Positive for dyspnea. Urologic: No hematuria, dysuria Abdominal:   No nausea, vomiting, diarrhea, bright red blood per rectum, melena, or hematemesis Neurologic:  No visual changes, wkns, changes in mental status. All other systems reviewed and are otherwise negative except as noted above.  Physical Exam/Data    Vitals:   10/28/20 1943 10/28/20 2019 10/29/20 0627 10/29/20 0737  BP:  (!) 140/94 125/87   Pulse:  88 90   Resp:  20 20   Temp:  98.6 F (37 C) 98.1 F (36.7 C)   TempSrc:  Oral Oral   SpO2: 98% 96% 96% 95%  Weight:      Height:        Intake/Output Summary (Last 24 hours) at 10/29/2020 1011 Last data filed at 10/29/2020 0900 Gross per 24 hour  Intake 630.88 ml  Output 850 ml  Net -219.12 ml   Filed Weights   10/25/20 0429 10/26/20 0618 10/26/20 0837  Weight: 114.5 kg 117.3 kg 116.6 kg  Body mass index is 33 kg/m.   General: Pleasant male appearing in NAD Psych: Normal affect. Neuro: Alert and oriented X 3. Moves all extremities spontaneously. HEENT: Normal  Neck: Supple without bruits. JVD at 10 cm. Lungs:  Resp regular and unlabored, decreased breath sounds along bases bilaterally. Heart: RRR no s3, s4, or murmurs. Abdomen: Soft, non-tender, non-distended, BS + x 4.  Extremities: 2+ pitting edema bilaterally with fluid blisters noted. DP/PT/Radials 2+ and equal bilaterally.   EKG:  The EKG was personally reviewed and demonstrates: Wide-QRS rhythm, HR 104 but appears regular and consistent with sinus tachycardia. Underlying LBBB.   Telemetry:   Telemetry was personally reviewed and demonstrates: NSR, HR in 80's to 90's. Previously having episodes of wide-complex tachycardia on 1/19 consistent with VT. Intermittent for over 8 minutes with longest single run being 25 beats.   Labs/Studies     Relevant CV Studies:  Echocardiogram: 07/2020 IMPRESSIONS   1. Left ventricular ejection fraction, by estimation, is 60 to 65%. The  left ventricle has normal function. The left ventricle has no regional  wall motion abnormalities. There is moderate concentric left ventricular  hypertrophy. Left ventricular  diastolic parameters are consistent with Grade III diastolic dysfunction  (restrictive).  2. Right ventricular systolic function is normal. The right ventricular  size is normal.  3. The mitral valve is normal in structure. Mild mitral valve  regurgitation.  4. The aortic valve is grossly normal. There is mild calcification of the  aortic valve. There is mild thickening of the aortic valve. Aortic valve  regurgitation is trivial.  5. The inferior vena cava is dilated in size with <50% respiratory  variability, suggesting right atrial pressure of 15 mmHg.   Comparison(s): No significant change from prior study.   Echocardiogram: 10/2020 IMPRESSIONS    1. Left ventricular ejection fraction, by estimation, is 35 to 40%. The  left ventricle has moderately decreased function. The left ventricle  demonstrates global hypokinesis. There is mild left ventricular  hypertrophy. Left ventricular diastolic  parameters are indeterminate.  2. Right ventricular systolic function is mildly reduced. The right  ventricular size is moderately enlarged. There is moderately elevated  pulmonary artery systolic pressure.  3. The mitral valve is normal in structure. Mild mitral valve  regurgitation. No evidence of mitral stenosis.  4. The aortic valve is tricuspid. There is mild calcification of the  aortic valve. There is mild thickening  of the aortic valve. Aortic valve  regurgitation is not visualized. No aortic stenosis is present.  5. Moderate pulmonary HTN, PASP is 54 mmHg.  6. The inferior vena cava is dilated in size with <50% respiratory  variability, suggesting right atrial pressure of 15 mmHg.   Laboratory Data:  Chemistry Recent Labs  Lab 10/27/20 0357 10/28/20 0032 10/29/20 0457  NA 134* 129* 132*  K 4.2 4.3 4.2  CL 92* 91* 92*  CO2 31 29 30   GLUCOSE 25* 267* 98  BUN 36* 41* 39*  CREATININE 1.43* 1.54* 1.47*  CALCIUM 8.5* 8.2* 8.4*  GFRNONAA 58* 53* 56*  ANIONGAP 11 9 10     Recent Labs  Lab 10/23/20 0610 10/29/20 0457  PROT 5.3* 6.4*  ALBUMIN 1.7* 2.3*  AST 31 50*  ALT 29 47*  ALKPHOS 113 118  BILITOT 0.8 0.6   Hematology Recent Labs  Lab 10/26/20 0613 10/27/20 0357 10/28/20 0032  WBC 8.7 7.4 7.9  RBC 3.71* 3.61* 3.49*  HGB 10.9* 10.6* 10.4*  HCT 34.7* 34.0* 32.9*  MCV 93.5  94.2 94.3  MCH 29.4 29.4 29.8  MCHC 31.4 31.2 31.6  RDW 15.5 15.4 15.5  PLT 436* 448* 405*    Radiology/Studies:  DG Chest 1 View  Result Date: 10/26/2020 CLINICAL DATA:  Leg swelling, shortness of breath, negative COVID EXAM: CHEST  1 VIEW COMPARISON:  10/22/2020 FINDINGS: Mild cardiomegaly and pulmonary vascular prominence. No overt airspace opacity. The visualized skeletal structures are unremarkable. IMPRESSION: Mild cardiomegaly and pulmonary vascular prominence. No overt pulmonary edema or other airspace opacity. Electronically Signed   By: Eddie Candle M.D.   On: 10/26/2020 13:12   Assessment & Plan    1. Acute on Chronic Combined Systolic and Diastolic CHF - Admitted for an acute CHF exacerbation but has been volume overloaded for several months and would refuse admission or leave AMA. BNP was elevated to 1662 on admission and repeat echo shows a reduced EF of 35-40% with global HK and mildly reduced RV function.  - He is receiving aggressive IV diuresis with Lasix 80mg  TID and renal function did  trend up slightly but has been stable. He has not allowed nursing staff to measure I&O's so will at least ask for a standing weight to improve accuracy of measuring his response. Weight was at 257 lbs on admission and was previously 221 lbs in 07/2020 but he reports a baseline in the 190's. Continue with IV diuresis.  - For his cardiomyopathy, will plan to titrate Toprol-XL to 25mg  daily and start Entresto 24-26mg  BID. Can consider Spironolactone as an outpatient but will be cautious given his Stage 3 CKD. Would ultimately plan for a repeat echocardiogram in 3 months if compliant with medical therapy in the interim. We also discussed the importance of limiting sodium and fluid intake as he has been trying to order delivery from local restaurants while admitted.   2. CAD - He is s/p BMS to RI in 2012 with 70-80% ISR by repeat cath in 2013 with medical management recommended due to small vessel size. Hs Troponin values have been flat at 36 and 34 this admission.  - He reports his dyspnea continues to improve with diuresis and denies any recent chest pain.  - He has been restarted on ASA 81mg  daily, Atorvastatin 80mg  daily and Toprol-XL 12.5mg  daily. No plans for repeat ischemic evaluation at this time in the setting of medication noncompliance.   3. Wide-Complex Tachycardia - He had intermittent episodes of VT on 10/27/2020 with Mg at 2.0 and K+ 4.2 but he was hypoglycemic at 25. Longest episode was 25 beats but intermittent runs of NSVT for over 8 minutes. No recurrence on telemetry since. Given his noncompliance, suspect he is not a candidate for ICD placement at this time but will review further with Dr. Domenic Polite. Will titrate Toprol-XL from 12.5mg  daily to 25mg  daily.   4. HTN - BP has been variable from 125/87 - 140/94 within the past 24 hours. Continue to follow closely given titration of Toprol-XL and initiation of Entresto.   5. History of DVT - He has remained on Eliquis 5mg  BID for  anticoagulation.   6. Stage 3 CKD - Baseline creatinine 1.2 - 1.3. Peaked at 1.54 this admission, improving to 1.47 today.    For questions or updates, please contact [facility] Please consult [email or web address] for contact info under Cardiology/STEMI.  Signed, Erma Heritage, PA-C 10/29/2020, 10:11 AM Pager: [phone]   Attending note:  Patient seen and examined.  I reviewed his records and discussed the case with Ms. Ahmed Prima PA-C.  Mr. Hauer is currently admitted with acute on chronic combined heart failure and substantial fluid overload, at least 35 pounds and potentially as much as 60 pounds.  He has a known cardiomyopathy with LVEF 35 to 40%, also mild RV dysfunction, CAD with prior BMS to the ramus intermedius, hypertension, CKD stage III, and prior DVT.  Unfortunately, he has demonstrated recurring noncompliance with both medical therapy and regular follow-up, has had multiple recurrent ER visits, has tended to leave AMA.  Most recently he has been living in his car.  We are consulted to assist with his management.  On examination he is afebrile, heart rate in the 90s in sinus rhythm by telemetry which I personally reviewed.  He has had episodes of recurrent wide-complex tachycardia, some consistent with VT. Recent systolics in the [id number] to [id number].  Lungs exhibit decreased breath sounds at the bases, PMI is indistinct without gallop.  He has edema and blistering of both legs.  Pertinent lab work includes sodium one thirty-two, potassium 4.2, creatinine 1.47, AST 50, ALT47, hemoglobin 10.4, platelets 405.  Chest x-ray reports mild cardiomegaly with pulmonary vascular congestion.  I reviewed his recent ECGs, rhythm is not well-defined and could be an atypical atrial flutter with variable and 2:1 conduction.  This could also explain some of his wide-complex tachycardia by monitor.  Clearly different P wave morphology than tracings from December 2021.  There is a left bundle branch block  at baseline.  Very difficult management situation.  Nursing home care recommended after inpatient evaluation, although not clear that patient will comply with this.  He is tolerating high-dose Lasix at 80 mg IV three times a day, renal function relatively stable.  Net urine output has not been measured accurately, repeat standing weight is being requested.  Toprol-XL will be uptitrated to 25 mg daily, also initiate Entresto 24/26 mg twice daily.  Might eventually be able to add low-dose Aldactone depending on renal function with continued diuresis and improvement in fluid status.  Otherwise continue low-dose aspirin and Lipitor with underlying CAD, no obvious angina reported.  He is already on Eliquis for history of DVT, so if his rhythm is atypical atrial flutter this would provide additional stroke prophylaxis.  Even with NSVT/wide-complex tachycardia, he is not a candidate for an ICD at this point.  Whether or not one would consider addition of amiodarone is another question, but would focus initially on trying to improve his fluid status and see if he can get on a stable medical regimen with reasonable follow-up.  All things considered, his overall prognosis is poor unless he can get on stable medical therapy and have regular evaluations.  Satira Sark, M.D., F.A.C.C.

## 2020-10-30 DIAGNOSIS — I5033 Acute on chronic diastolic (congestive) heart failure: Secondary | ICD-10-CM | POA: Diagnosis not present

## 2020-10-30 LAB — GLUCOSE, CAPILLARY
Glucose-Capillary: 141 mg/dL — ABNORMAL HIGH (ref 70–99)
Glucose-Capillary: 122 mg/dL — ABNORMAL HIGH (ref 70–99)
Glucose-Capillary: 209 mg/dL — ABNORMAL HIGH (ref 70–99)
Glucose-Capillary: 241 mg/dL — ABNORMAL HIGH (ref 70–99)
Glucose-Capillary: 281 mg/dL — ABNORMAL HIGH (ref 70–99)

## 2020-10-30 LAB — BASIC METABOLIC PANEL
Anion gap: 10 (ref 5–15)
BUN: 40 mg/dL — ABNORMAL HIGH (ref 6–20)
CO2: 31 mmol/L (ref 22–32)
Calcium: 8.5 mg/dL — ABNORMAL LOW (ref 8.9–10.3)
Chloride: 93 mmol/L — ABNORMAL LOW (ref 98–111)
Creatinine, Ser: 1.6 mg/dL — ABNORMAL HIGH (ref 0.61–1.24)
GFR, Estimated: 51 mL/min — ABNORMAL LOW (ref >60)
Glucose, Bld: 129 mg/dL — ABNORMAL HIGH (ref 70–99)
Potassium: 4.2 mmol/L (ref 3.5–5.1)
Sodium: 134 mmol/L — ABNORMAL LOW (ref 135–145)

## 2020-10-30 NOTE — Progress Notes (Signed)
Patient's bed alarm going off.  Patient is a high fall risk.  Patient states he has been walking well all day and does not want alarm on.Patient refused bed alarm at this time. Will continue to monitor patient.

## 2020-10-30 NOTE — Progress Notes (Signed)
PROGRESS NOTE    Dustin Cruz  MGQ:[id number] DOB: 1965/12/18 DOA: 10/22/2020 PCP: Dustin Norlander, DO   Brief Narrative:  Per HPI: Dustin Scoggin Packis a 55 y.o.malewith medical history significant forCAD, type 2 diabetes, diastolic heart failure, GERD, and COPD who presented to the ED with worsening lower extremity edema as well as abdominal edema and scrotal edema as well as some weakness. He is currently homeless and lives in his car. He states that he had run out of his Lasix as well as his diabetes medication for the last few days. It appears that on last admission on 09/2020 that he left AMA as he was being treated for edema at that time. He denies any fever, chills, abdominal pain, nausea, vomiting, chest pain, or other concerns.  -Patient has been admitted with significant volume overload/anasarca in the setting of acute on chronic diastolic CHF exacerbation with poor compliance on home medications.  He was also noted to have some initial hypoglycemia that has now resolved.  Assessment & Plan:   Active Problems:   Acute on chronic diastolic (congestive) heart failure (HCC)   Significant volume overload with anasarca and acute on chronic systolic CHF exacerbation-improving -Secondary to noncompliance with home medications -Last 2D echocardiogram performed on10/16/2021 with LVEF 60-65% and grade 3 diastolic dysfunction, repeated Echo 1/18 with LVEF 35-40% -Continued here on Lasix IV 80 mgnow three timesdaily, for goal diuresis of 2-3L daily, difficult to evaluate given scrotal edema with frequent urination over the floor per nursing staff.  Could not place Foley catheter due to significant penile edema.  He does not allow nurses to record output. -Monitor daily weights and strict I's and O's -Hypoalbuminemia may also be playing a role in overall edema.  He received albumin infusion 10/28/2020. -Cardiac diet with fluid restriction -Wound care to lower extremities -PT  evaluation with recommendations for skilled nursing facility Now cardiology on board.  Entresto on hold.  Defer further management to cardiology.  He has no shortness of breath but continues to have significant edema in bilateral lower extremities and scrotum and penis.  Filed Weights   10/26/20 0837 10/29/20 1000 10/30/20 0500  Weight: 116.6 kg 115.4 kg 115.5 kg    Mild hypoxemia -Patient became more dyspneic with some mild hypoxemia noted on 1/18, no chest pain -Chest x-ray with cardiomegaly and pulmonary vascular congestion, but no frank edema -EKG with some noted bigeminy and tachycardia, repeat EKG in am He is comfortable without any dyspnea and is not hypoxic.  Continue diuresis.  Hyperglycemia in the setting of type 2 diabetes-improved -Noncompliant with home medications Blood sugar better now.  Continue Lantus 10 units and SSI.  Dyslipidemia -Continue Lipitor  CAD with prior stent placement -Continue on aspirin, Eliquis, and Lipitor -Resume home b-blocker -Monitor on telemetry. Management per cardiology.  GERD -Famotidine  COPD -No acute bronchospasms currently noted -Continue Dulera -Duo nebs with Xopenex ordered as needed shortness of breath or wheezing  History of medication noncompliance and leaving AMA -PT/TOC evaluation for needs on dc  Protein calorie malnutrition -Dietitian consultation  CKD stage IIIa: At baseline.  Monitor.  DVT prophylaxis:Eliquis Code Status:Full Family Communication:  None at bedside. Disposition Plan: Status is: Inpatient  Remains inpatient appropriate because:IV treatments appropriate due to intensity of illness or inability to take PO and Inpatient level of care appropriate due to severity of illness  Dispo: The patient is from:Home Anticipated d/c is to:SNF Anticipated d/c date is:2-3days Patient currently is not medically stable to d/c.Pt requires  ongoing IV  diuresis due to volume overload and mild hypoxemia.  Need to monitor heart rate control on telemetry as well.   Consultants:  None  Procedures:  See below  Antimicrobials:  None  Subjective: Seen and examined.  Sitting in the bed eating breakfast.  No complaints.  Objective: Vitals:   10/29/20 2015 10/30/20 0408 10/30/20 0500 10/30/20 0724  BP: (!) 157/90 (!) 145/80    Pulse: 97 96    Resp:  20    Temp: 98.2 F (36.8 C) 98 F (36.7 C)    TempSrc:      SpO2: 98% 98%  92%  Weight:   115.5 kg   Height:        Intake/Output Summary (Last 24 hours) at 10/30/2020 1038 Last data filed at 10/29/2020 1700 Gross per 24 hour  Intake 480 ml  Output 425 ml  Net 55 ml   Filed Weights   10/26/20 0837 10/29/20 1000 10/30/20 0500  Weight: 116.6 kg 115.4 kg 115.5 kg    Examination:  General exam: Appears calm and comfortable  Respiratory system: Clear to auscultation. Respiratory effort normal. Cardiovascular system: S1 & S2 heard, RRR. No JVD, murmurs, rubs, gallops or clicks.  +2 pitting edema bilateral lower extremity Gastrointestinal system: Abdomen is nondistended, soft and nontender. No organomegaly or masses felt. Normal bowel sounds heard.  Moderate to large scrotal and penile edema Central nervous system: Alert and oriented. No focal neurological deficits. Extremities: Symmetric 5 x 5 power. Skin: No rashes, lesions or ulcers.  Psychiatry: Judgement and insight appear normal. Mood & affect appropriate.     Data Reviewed: I have personally reviewed following labs and imaging studies  CBC: Recent Labs  Lab 10/24/20 0500 10/26/20 0613 10/27/20 0357 10/28/20 0032  WBC 9.0 8.7 7.4 7.9  HGB 11.4* 10.9* 10.6* 10.4*  HCT 36.0* 34.7* 34.0* 32.9*  MCV 93.3 93.5 94.2 94.3  PLT 482* 436* 448* 123456*   Basic Metabolic Panel: Recent Labs  Lab 10/24/20 0500 10/25/20 0410 10/26/20 [id number] 10/27/20 0357 10/28/20 0032 10/29/20 0457 10/30/20 0652  NA 133* 134*  132* 134* 129* 132* 134*  K 3.5 3.8 4.1 4.2 4.3 4.2 4.2  CL 92* 92* 91* 92* 91* 92* 93*  CO2 32 33* 31 31 29 30 31   GLUCOSE 63* 38* 73 25* 267* 98 129*  BUN 24* 28* 31* 36* 41* 39* 40*  CREATININE 1.29* 1.34* 1.44* 1.43* 1.54* 1.47* 1.60*  CALCIUM 8.0* 8.0* 8.2* 8.5* 8.2* 8.4* 8.5*  MG 1.9 2.0 1.9 2.0 2.0  --   --    GFR: Estimated Creatinine Clearance: 70.5 mL/min (A) (by C-G formula based on SCr of 1.6 mg/dL (H)). Liver Function Tests: Recent Labs  Lab 10/29/20 0457  AST 50*  ALT 47*  ALKPHOS 118  BILITOT 0.6  PROT 6.4*  ALBUMIN 2.3*   No results for input(s): LIPASE, AMYLASE in the last 168 hours. Recent Labs  Lab 10/25/20 0720  AMMONIA 25   Coagulation Profile: No results for input(s): INR, PROTIME in the last 168 hours. Cardiac Enzymes: No results for input(s): CKTOTAL, CKMB, CKMBINDEX, TROPONINI in the last 168 hours. BNP (last 3 results) No results for input(s): PROBNP in the last 8760 hours. HbA1C: No results for input(s): HGBA1C in the last 72 hours. CBG: Recent Labs  Lab 10/29/20 1145 10/29/20 1627 10/29/20 2016 10/30/20 0409 10/30/20 0748  GLUCAP 287* 243* 259* 141* 122*   Lipid Profile: No results for input(s): CHOL, HDL, LDLCALC, TRIG, CHOLHDL, LDLDIRECT  in the last 72 hours. Thyroid Function Tests: No results for input(s): TSH, T4TOTAL, FREET4, T3FREE, THYROIDAB in the last 72 hours. Anemia Panel: No results for input(s): VITAMINB12, FOLATE, FERRITIN, TIBC, IRON, RETICCTPCT in the last 72 hours. Sepsis Labs: No results for input(s): PROCALCITON, LATICACIDVEN in the last 168 hours.  Recent Results (from the past 240 hour(s))  Resp Panel by RT-PCR (Flu A&B, Covid) Nasopharyngeal Swab     Status: None   Collection Time: 10/22/20 10:42 AM   Specimen: Nasopharyngeal Swab; Nasopharyngeal(NP) swabs in vial transport medium  Result Value Ref Range Status   SARS Coronavirus 2 by RT PCR NEGATIVE NEGATIVE Final    Comment: (NOTE) SARS-CoV-2 target  nucleic acids are NOT DETECTED.  The SARS-CoV-2 RNA is generally detectable in upper respiratory specimens during the acute phase of infection. The lowest concentration of SARS-CoV-2 viral copies this assay can detect is 138 copies/mL. A negative result does not preclude SARS-Cov-2 infection and should not be used as the sole basis for treatment or other patient management decisions. A negative result may occur with  improper specimen collection/handling, submission of specimen other than nasopharyngeal swab, presence of viral mutation(s) within the areas targeted by this assay, and inadequate number of viral copies(<138 copies/mL). A negative result must be combined with clinical observations, patient history, and epidemiological information. The expected result is Negative.  Fact Sheet for Patients:  BloggerCourse.com  Fact Sheet for Healthcare Providers:  SeriousBroker.it  This test is no t yet approved or cleared by the [place] FDA and  has been authorized for detection and/or diagnosis of SARS-CoV-2 by FDA under an Emergency Use Authorization (EUA). This EUA will remain  in effect (meaning this test can be used) for the duration of the COVID-19 declaration under Section 564(b)(1) of the Act, 21 U.S.C.section 360bbb-3(b)(1), unless the authorization is terminated  or revoked sooner.       Influenza A by PCR NEGATIVE NEGATIVE Final   Influenza B by PCR NEGATIVE NEGATIVE Final    Comment: (NOTE) The Xpert Xpress SARS-CoV-2/FLU/RSV plus assay is intended as an aid in the diagnosis of influenza from Nasopharyngeal swab specimens and should not be used as a sole basis for treatment. Nasal washings and aspirates are unacceptable for Xpert Xpress SARS-CoV-2/FLU/RSV testing.  Fact Sheet for Patients: BloggerCourse.com  Fact Sheet for Healthcare  Providers: SeriousBroker.it  This test is not yet approved or cleared by the [place] FDA and has been authorized for detection and/or diagnosis of SARS-CoV-2 by FDA under an Emergency Use Authorization (EUA). This EUA will remain in effect (meaning this test can be used) for the duration of the COVID-19 declaration under Section 564(b)(1) of the Act, 21 U.S.C. section 360bbb-3(b)(1), unless the authorization is terminated or revoked.  Performed at [facility], [address], [place], [place] [address]     Radiology Studies: No results found. Scheduled Meds: . apixaban  5 mg Oral BID  . aspirin EC  81 mg Oral Q breakfast  . atorvastatin  80 mg Oral Daily  . busPIRone  7.5 mg Oral BID  . famotidine  20 mg Oral BID  . furosemide  80 mg Intravenous Q8H  . gabapentin  300 mg Oral TID  . insulin aspart  0-5 Units Subcutaneous QHS  . insulin aspart  0-9 Units Subcutaneous TID WC  . insulin glargine  10 Units Subcutaneous QPM  . melatonin  6 mg Oral QHS  . metoprolol succinate  25 mg Oral Daily  . mometasone-formoterol  2 puff Inhalation BID  . nystatin   Topical BID  . potassium chloride  10 mEq Oral BID  . sacubitril-valsartan  1 tablet Oral BID  . sodium chloride flush  3 mL Intravenous Q12H  . traZODone  150 mg Oral QHS   Continuous Infusions: . sodium chloride       LOS: 8 days   Time spent: 28 minutes  Darliss Cheney, MD How to contact the [facility] Attending or Consulting provider [place] or covering provider during after hours [place], for this patient?  1. Check the care team in [facility] and look for a) attending/consulting TRH provider listed and b) the [facility] team listed 2. Log into [email or web address] and use [place]'s universal password to access. If you do not have the password, please contact the hospital operator. 3. Locate the [facility] provider you are looking for under [facility] and page to a number that you can be directly reached. 4. If  you still have difficulty reaching the provider, please page the [facility] (Director on Call) for the Hospitalists listed on amion for assistance.  10/30/2020, 10:38 AM

## 2020-10-31 DIAGNOSIS — I5033 Acute on chronic diastolic (congestive) heart failure: Secondary | ICD-10-CM | POA: Diagnosis not present

## 2020-10-31 LAB — BASIC METABOLIC PANEL
Anion gap: 11 (ref 5–15)
BUN: 40 mg/dL — ABNORMAL HIGH (ref 6–20)
CO2: 31 mmol/L (ref 22–32)
Calcium: 8.7 mg/dL — ABNORMAL LOW (ref 8.9–10.3)
Chloride: 94 mmol/L — ABNORMAL LOW (ref 98–111)
Creatinine, Ser: 1.48 mg/dL — ABNORMAL HIGH (ref 0.61–1.24)
GFR, Estimated: 56 mL/min — ABNORMAL LOW (ref >60)
Glucose, Bld: 134 mg/dL — ABNORMAL HIGH (ref 70–99)
Potassium: 3.9 mmol/L (ref 3.5–5.1)
Sodium: 136 mmol/L (ref 135–145)

## 2020-10-31 LAB — GLUCOSE, CAPILLARY
Glucose-Capillary: 161 mg/dL — ABNORMAL HIGH (ref 70–99)
Glucose-Capillary: 167 mg/dL — ABNORMAL HIGH (ref 70–99)
Glucose-Capillary: 228 mg/dL — ABNORMAL HIGH (ref 70–99)
Glucose-Capillary: 184 mg/dL — ABNORMAL HIGH (ref 70–99)
Glucose-Capillary: 232 mg/dL — ABNORMAL HIGH (ref 70–99)

## 2020-10-31 LAB — MAGNESIUM: Magnesium: 1.8 mg/dL (ref 1.7–2.4)

## 2020-10-31 MED ORDER — INSULIN GLARGINE 100 UNIT/ML ~~LOC~~ SOLN
13.0000 [IU] | Freq: Every evening | SUBCUTANEOUS | Status: DC
  Administered 2020-10-31 – 2020-11-01 (×2): 13 [IU] via SUBCUTANEOUS
  Filled 2020-10-31 (×3): qty 0.13

## 2020-10-31 NOTE — Progress Notes (Signed)
MD made aware that patient refuses to wear tele unit

## 2020-10-31 NOTE — Progress Notes (Signed)
PROGRESS NOTE    Dustin Cruz  JJK:[id number] DOB: [birth year] DOA: 10/22/2020 PCP: [name], DO   Brief Narrative:  Per HPI: Dustin Cruz a 55 y.o.malewith medical history significant forCAD, type 2 diabetes, diastolic heart failure, GERD, and COPD who presented to the ED with worsening lower extremity edema as well as abdominal edema and scrotal edema as well as some weakness. He is currently homeless and lives in his car. He states that he had run out of his Lasix as well as his diabetes medication for the last few days. It appears that on last admission on 09/2020 that he left AMA as he was being treated for edema at that time. He denies any fever, chills, abdominal pain, nausea, vomiting, chest pain, or other concerns.  -Patient has been admitted with significant volume overload/anasarca in the setting of acute on chronic diastolic CHF exacerbation with poor compliance on home medications.  He was also noted to have some initial hypoglycemia that has now resolved.  Assessment & Plan:   Active Problems:   Acute on chronic diastolic (congestive) heart failure (HCC)   Significant volume overload with anasarca and acute on chronic systolic CHF exacerbation-improving -Secondary to noncompliance with home medications -Last 2D echocardiogram performed on10/16/2021 with LVEF 60-65% and grade 3 diastolic dysfunction, repeated Echo 1/18 with LVEF 35-40% -Continued here on Lasix IV 80 mgnow three timesdaily, for goal diuresis of 2-3L daily, difficult to evaluate given scrotal edema with frequent urination over the floor per nursing staff.  Could not place Foley catheter due to significant penile edema.  He does not allow nurses to record output. -Monitor daily weights and strict I's and O's -Hypoalbuminemia may also be playing a role in overall edema.  He received albumin infusion 10/28/2020. -Cardiac diet with fluid restriction -Wound care to lower extremities -PT  evaluation with recommendations for skilled nursing facility Now cardiology on board.  He remains on Entresto and IV Lasix 80 mg 3 times daily.  Defer further management to cardiology.  He has no shortness of breath but continues to have significant edema in bilateral lower extremities and scrotum and penis.  He is refusing to wear telemetry.  We will DC that.  He is agreeable to go to SNF.  Filed Weights   10/29/20 1000 10/30/20 0500 10/31/20 0509  Weight: 115.4 kg 115.5 kg 114.4 kg    Mild hypoxemia -Patient became more dyspneic with some mild hypoxemia noted on 1/18, no chest pain -Chest x-ray with cardiomegaly and pulmonary vascular congestion, but no frank edema -EKG with some noted bigeminy and tachycardia, repeat EKG in am He is comfortable without any dyspnea and is not hypoxic.  Continue diuresis.  Hyperglycemia in the setting of type 2 diabetes-improved -Noncompliant with home medications Blood sugar slightly elevated.  Will increase Lantus to 13 units and continue SSI.  Dyslipidemia -Continue Lipitor  CAD with prior stent placement -Continue on aspirin, Eliquis, and Lipitor -Resume home b-blocker -Monitor on telemetry. Management per cardiology.  GERD -Famotidine  COPD -No acute bronchospasms currently noted -Continue Dulera -Duo nebs with Xopenex ordered as needed shortness of breath or wheezing  History of medication noncompliance and leaving AMA -PT/TOC evaluation for needs on dc  Protein calorie malnutrition -Dietitian consultation  CKD stage IIIa: At baseline.  Monitor.  DVT prophylaxis:Eliquis Code Status:Full Family Communication:  None at bedside.  He asked me to give a call to his significant other Burnette, I attempted but no response, left voicemail. Disposition Plan: Status is:  Inpatient  Remains inpatient appropriate because:IV treatments appropriate due to intensity of illness or inability to take PO and Inpatient level of care  appropriate due to severity of illness  Dispo: The patient is from:Home Anticipated d/c is to:SNF Anticipated d/c date is:2-3days Patient currently is not medically stable to d/c.Pt requires ongoing IV diuresis due to volume overload and mild hypoxemia.  Need to monitor heart rate control on telemetry as well.   Consultants:  None  Procedures:  See below  Antimicrobials:  None  Subjective: Seen and examined.  No shortness of breath or any other complaint.  Concerned about his edema, mainly penile and scrotal edema.  Objective: Vitals:   10/30/20 2111 10/31/20 0202 10/31/20 0509 10/31/20 0723  BP: (!) 131/99  127/84   Pulse: 91  92   Resp: 18  20   Temp: 97.9 F (36.6 C)  98.4 F (36.9 C)   TempSrc: Oral  Oral   SpO2: 99% 98% 98% 97%  Weight:   114.4 kg   Height:        Intake/Output Summary (Last 24 hours) at 10/31/2020 1006 Last data filed at 10/31/2020 0900 Gross per 24 hour  Intake 480 ml  Output 2050 ml  Net -1570 ml   Filed Weights   10/29/20 1000 10/30/20 0500 10/31/20 0509  Weight: 115.4 kg 115.5 kg 114.4 kg    Examination:  General exam: Appears calm and comfortable  Respiratory system: Clear to auscultation. Respiratory effort normal. Cardiovascular system: S1 & S2 heard, RRR. No JVD, murmurs, rubs, gallops or clicks.  +2 pitting edema bilateral lower extremity. Gastrointestinal system: Abdomen is nondistended, soft and nontender. No organomegaly or masses felt. Normal bowel sounds heard.  Moderate to severe penile and scrotal edema. Central nervous system: Alert and oriented. No focal neurological deficits. Extremities: Symmetric 5 x 5 power. Skin: No rashes, lesions or ulcers.  Psychiatry: Judgement and insight appear normal. Mood & affect appropriate.    Data Reviewed: I have personally reviewed following labs and imaging studies  CBC: Recent Labs  Lab 10/26/20 0613 10/27/20 0357  10/28/20 0032  WBC 8.7 7.4 7.9  HGB 10.9* 10.6* 10.4*  HCT 34.7* 34.0* 32.9*  MCV 93.5 94.2 94.3  PLT 436* 448* 123456*   Basic Metabolic Panel: Recent Labs  Lab 10/25/20 0410 10/26/20 [id number] 10/27/20 0357 10/28/20 0032 10/29/20 0457 10/30/20 0652 10/31/20 0500  NA 134* 132* 134* 129* 132* 134* 136  K 3.8 4.1 4.2 4.3 4.2 4.2 3.9  CL 92* 91* 92* 91* 92* 93* 94*  CO2 33* 31 31 29 30 31 31   GLUCOSE 38* 73 25* 267* 98 129* 134*  BUN 28* 31* 36* 41* 39* 40* 40*  CREATININE 1.34* 1.44* 1.43* 1.54* 1.47* 1.60* 1.48*  CALCIUM 8.0* 8.2* 8.5* 8.2* 8.4* 8.5* 8.7*  MG 2.0 1.9 2.0 2.0  --   --  1.8   GFR: Estimated Creatinine Clearance: 75.9 mL/min (A) (by C-G formula based on SCr of 1.48 mg/dL (H)). Liver Function Tests: Recent Labs  Lab 10/29/20 0457  AST 50*  ALT 47*  ALKPHOS 118  BILITOT 0.6  PROT 6.4*  ALBUMIN 2.3*   No results for input(s): LIPASE, AMYLASE in the last 168 hours. Recent Labs  Lab 10/25/20 0720  AMMONIA 25   Coagulation Profile: No results for input(s): INR, PROTIME in the last 168 hours. Cardiac Enzymes: No results for input(s): CKTOTAL, CKMB, CKMBINDEX, TROPONINI in the last 168 hours. BNP (last 3 results) No results for input(s): PROBNP  in the last 8760 hours. HbA1C: No results for input(s): HGBA1C in the last 72 hours. CBG: Recent Labs  Lab 10/30/20 1142 10/30/20 1619 10/30/20 2020 10/31/20 0332 10/31/20 0826  GLUCAP 209* 241* 281* 161* 167*   Lipid Profile: No results for input(s): CHOL, HDL, LDLCALC, TRIG, CHOLHDL, LDLDIRECT in the last 72 hours. Thyroid Function Tests: No results for input(s): TSH, T4TOTAL, FREET4, T3FREE, THYROIDAB in the last 72 hours. Anemia Panel: No results for input(s): VITAMINB12, FOLATE, FERRITIN, TIBC, IRON, RETICCTPCT in the last 72 hours. Sepsis Labs: No results for input(s): PROCALCITON, LATICACIDVEN in the last 168 hours.  Recent Results (from the past 240 hour(s))  Resp Panel by RT-PCR (Flu A&B, Covid)  Nasopharyngeal Swab     Status: None   Collection Time: 10/22/20 10:42 AM   Specimen: Nasopharyngeal Swab; Nasopharyngeal(NP) swabs in vial transport medium  Result Value Ref Range Status   SARS Coronavirus 2 by RT PCR NEGATIVE NEGATIVE Final    Comment: (NOTE) SARS-CoV-2 target nucleic acids are NOT DETECTED.  The SARS-CoV-2 RNA is generally detectable in upper respiratory specimens during the acute phase of infection. The lowest concentration of SARS-CoV-2 viral copies this assay can detect is 138 copies/mL. A negative result does not preclude SARS-Cov-2 infection and should not be used as the sole basis for treatment or other patient management decisions. A negative result may occur with  improper specimen collection/handling, submission of specimen other than nasopharyngeal swab, presence of viral mutation(s) within the areas targeted by this assay, and inadequate number of viral copies(<138 copies/mL). A negative result must be combined with clinical observations, patient history, and epidemiological information. The expected result is Negative.  Fact Sheet for Patients:  EntrepreneurPulse.com.au  Fact Sheet for Healthcare Providers:  IncredibleEmployment.be  This test is no t yet approved or cleared by the [place] FDA and  has been authorized for detection and/or diagnosis of SARS-CoV-2 by FDA under an Emergency Use Authorization (EUA). This EUA will remain  in effect (meaning this test can be used) for the duration of the COVID-19 declaration under Section 564(b)(1) of the Act, 21 U.S.C.section 360bbb-3(b)(1), unless the authorization is terminated  or revoked sooner.       Influenza A by PCR NEGATIVE NEGATIVE Final   Influenza B by PCR NEGATIVE NEGATIVE Final    Comment: (NOTE) The Xpert Xpress SARS-CoV-2/FLU/RSV plus assay is intended as an aid in the diagnosis of influenza from Nasopharyngeal swab specimens and should not be  used as a sole basis for treatment. Nasal washings and aspirates are unacceptable for Xpert Xpress SARS-CoV-2/FLU/RSV testing.  Fact Sheet for Patients: EntrepreneurPulse.com.au  Fact Sheet for Healthcare Providers: IncredibleEmployment.be  This test is not yet approved or cleared by the [place] FDA and has been authorized for detection and/or diagnosis of SARS-CoV-2 by FDA under an Emergency Use Authorization (EUA). This EUA will remain in effect (meaning this test can be used) for the duration of the COVID-19 declaration under Section 564(b)(1) of the Act, 21 U.S.C. section 360bbb-3(b)(1), unless the authorization is terminated or revoked.  Performed at [facility], [address], [place], [place] [address]     Radiology Studies: No results found. Scheduled Meds: . apixaban  5 mg Oral BID  . aspirin EC  81 mg Oral Q breakfast  . atorvastatin  80 mg Oral Daily  . busPIRone  7.5 mg Oral BID  . famotidine  20 mg Oral BID  . furosemide  80 mg Intravenous Q8H  . gabapentin  300  mg Oral TID  . insulin aspart  0-5 Units Subcutaneous QHS  . insulin aspart  0-9 Units Subcutaneous TID WC  . insulin glargine  13 Units Subcutaneous QPM  . melatonin  6 mg Oral QHS  . metoprolol succinate  25 mg Oral Daily  . mometasone-formoterol  2 puff Inhalation BID  . nystatin   Topical BID  . potassium chloride  10 mEq Oral BID  . sacubitril-valsartan  1 tablet Oral BID  . sodium chloride flush  3 mL Intravenous Q12H  . traZODone  150 mg Oral QHS   Continuous Infusions: . sodium chloride       LOS: 9 days   Time spent: 30 minutes  Darliss Cheney, MD How to contact the [facility] Attending or Consulting provider [place] or covering provider during after hours [place], for this patient?  1. Check the care team in [facility] and look for a) attending/consulting TRH provider listed and b) the [facility] team listed 2. Log into [email or web address] and use [place]'s universal  password to access. If you do not have the password, please contact the hospital operator. 3. Locate the [facility] provider you are looking for under [facility] and page to a number that you can be directly reached. 4. If you still have difficulty reaching the provider, please page the [facility] (Director on Call) for the Hospitalists listed on amion for assistance.  10/31/2020, 10:06 AM

## 2020-11-01 DIAGNOSIS — I5033 Acute on chronic diastolic (congestive) heart failure: Secondary | ICD-10-CM | POA: Diagnosis not present

## 2020-11-01 LAB — BASIC METABOLIC PANEL
Anion gap: 10 (ref 5–15)
BUN: 37 mg/dL — ABNORMAL HIGH (ref 6–20)
CO2: 33 mmol/L — ABNORMAL HIGH (ref 22–32)
Calcium: 8.5 mg/dL — ABNORMAL LOW (ref 8.9–10.3)
Chloride: 91 mmol/L — ABNORMAL LOW (ref 98–111)
Creatinine, Ser: 1.38 mg/dL — ABNORMAL HIGH (ref 0.61–1.24)
GFR, Estimated: >60 mL/min (ref >60)
Glucose, Bld: 149 mg/dL — ABNORMAL HIGH (ref 70–99)
Potassium: 3.6 mmol/L (ref 3.5–5.1)
Sodium: 134 mmol/L — ABNORMAL LOW (ref 135–145)

## 2020-11-01 LAB — GLUCOSE, CAPILLARY
Glucose-Capillary: 150 mg/dL — ABNORMAL HIGH (ref 70–99)
Glucose-Capillary: 147 mg/dL — ABNORMAL HIGH (ref 70–99)
Glucose-Capillary: 150 mg/dL — ABNORMAL HIGH (ref 70–99)
Glucose-Capillary: 208 mg/dL — ABNORMAL HIGH (ref 70–99)
Glucose-Capillary: 286 mg/dL — ABNORMAL HIGH (ref 70–99)
Glucose-Capillary: 237 mg/dL — ABNORMAL HIGH (ref 70–99)

## 2020-11-01 MED ORDER — METOLAZONE 5 MG PO TABS
2.5000 mg | ORAL_TABLET | Freq: Once | ORAL | Status: AC
  Administered 2020-11-01: 2.5 mg via ORAL
  Filled 2020-11-01: qty 1

## 2020-11-01 NOTE — Progress Notes (Signed)
Physical Therapy Treatment Patient Details Name: Dustin Cruz MRN: [id number] DOB: 02-Jul-1966 Today's Date: 11/01/2020    History of Present Illness Dustin Cruz is a 55 y.o. male with medical history significant for CAD, type 2 diabetes, diastolic heart failure, GERD, and COPD who presented to the ED with worsening lower extremity edema as well as abdominal edema and scrotal edema as well as some weakness.  He is currently homeless and lives in his car.  He states that he had run out of his Lasix as well as his diabetes medication for the last few days.  It appears that on last admission on 09/2020 that he left AMA as he was being treated for edema at that time.  He denies any fever, chills, abdominal pain, nausea, vomiting, chest pain, or other concerns.    PT Comments    Patient demonstrates slow labored movement for sitting up at bedside, frequent leaning backwards while completing BLE exercises seated at bedside, unable to flex hips against gravity while seated requiring active assistance to complete marching in place, labored movement for completing sit to stands with quad-cane, increased tolerance for taking steps in room/hallway with mostly step-to pattern with quad-cane, limited secondary to c/o fatigue and feet pain.  Patient tolerated sitting up in chair after therapy - RN notified.  Patient will benefit from continued physical therapy in hospital and recommended venue below to increase strength, balance, endurance for safe ADLs and gait.     Follow Up Recommendations  SNF;Supervision for mobility/OOB;Supervision - Intermittent     Equipment Recommendations  Other (comment) (Rollator walker)    Recommendations for Other Services       Precautions / Restrictions Precautions Precautions: Fall Restrictions Weight Bearing Restrictions: No    Mobility  Bed Mobility Overal bed mobility: Needs Assistance Bed Mobility: Supine to Sit     Supine to sit: Min assist;HOB elevated;Min  guard     General bed mobility comments: increased time, labored movement  Transfers Overall transfer level: Needs assistance Equipment used: Quad cane Transfers: Sit to/from [employer] to Stand: Min assist Stand pivot transfers: Min guard       General transfer comment: increased time, labored movement to complete sit to stands  Ambulation/Gait Ambulation/Gait assistance: Min guard Gait Distance (Feet): 55 Feet Assistive device: Quad cane Gait Pattern/deviations: Step-to pattern;Decreased step length - right;Decreased step length - left;Decreased stride length Gait velocity: decreased   General Gait Details: slow labored cadence with frequent leaning on siderail, wall for support using quad-cane, required frequent standing rest breaks before returning to room, limited mostly due to c/o fatigue and in increasing bilateral foot pain   Stairs             Wheelchair Mobility    Modified Rankin (Stroke Patients Only)       Balance Overall balance assessment: Needs assistance Sitting-balance support: Feet supported;No upper extremity supported Sitting balance-Leahy Scale: Fair Sitting balance - Comments: fair/good seated at EOB Postural control: Posterior lean Standing balance support: During functional activity;Single extremity supported Standing balance-Leahy Scale: Fair Standing balance comment: using quad-cane                            Cognition Arousal/Alertness: Awake/alert Behavior During Therapy: WFL for tasks assessed/performed Overall Cognitive Status: Within Functional Limits for tasks assessed  Exercises General Exercises - Lower Extremity Long Arc Quad: Seated;AROM;Strengthening;Both;10 reps Hip Flexion/Marching: Seated;Strengthening;Both;10 reps;AAROM Toe Raises: Seated;AROM;Strengthening;Both;10 reps Heel Raises: Seated;AROM;Strengthening;Both;10 reps     General Comments        Pertinent Vitals/Pain Pain Assessment: Faces Faces Pain Scale: Hurts little more Pain Location: bilateral feet Pain Descriptors / Indicators: Sore Pain Intervention(s): Limited activity within patient's tolerance;Monitored during session    Home Living                      Prior Function            PT Goals (current goals can now be found in the care plan section) Acute Rehab PT Goals Patient Stated Goal: Get stronger PT Goal Formulation: With patient Time For Goal Achievement: 11/08/20 Potential to Achieve Goals: Good Progress towards PT goals: Progressing toward goals    Frequency    Min 3X/week      PT Plan Current plan remains appropriate    Co-evaluation              AM-PAC PT "6 Clicks" Mobility   Outcome Measure  Help needed turning from your back to your side while in a flat bed without using bedrails?: None Help needed moving from lying on your back to sitting on the side of a flat bed without using bedrails?: A Little Help needed moving to and from a bed to a chair (including a wheelchair)?: A Little Help needed standing up from a chair using your arms (e.g., wheelchair or bedside chair)?: A Little Help needed to walk in hospital room?: A Little Help needed climbing 3-5 steps with a railing? : A Lot 6 Click Score: 18    End of Session   Activity Tolerance: Patient tolerated treatment well;Patient limited by fatigue Patient left: in chair;with call bell/phone within reach Nurse Communication: Mobility status PT Visit Diagnosis: Unsteadiness on feet (R26.81);Other abnormalities of gait and mobility (R26.89);Muscle weakness (generalized) (M62.81)     Time: 1950-9326 PT Time Calculation (min) (ACUTE ONLY): 21 min  Charges:  $Gait Training: 8-22 mins $Therapeutic Exercise: 8-22 mins                     2:22 PM, 11/01/20 Lonell Grandchild, MPT Physical Therapist with [facility] 336 [phone]  office [phone] mobile phone

## 2020-11-01 NOTE — Progress Notes (Signed)
PROGRESS NOTE    Dustin Cruz  NWG:[id number] DOB: 11-28-1965 DOA: 10/22/2020 PCP: [name], DO   Brief Narrative:  Per HPI: Dustin Cruz a 55 y.o.malewith medical history significant forCAD, type 2 diabetes, diastolic heart failure, GERD, and COPD who presented to the ED with worsening lower extremity edema as well as abdominal edema and scrotal edema as well as some weakness. He is currently homeless and lives in his car. He states that he had run out of his Lasix as well as his diabetes medication for the last few days. It appears that on last admission on 09/2020 that he left AMA as he was being treated for edema at that time. He denies any fever, chills, abdominal pain, nausea, vomiting, chest pain, or other concerns.  -Patient has been admitted with significant volume overload/anasarca in the setting of acute on chronic diastolic CHF exacerbation with poor compliance on home medications.  He was also noted to have some initial hypoglycemia that has now resolved.  Assessment & Plan:   Active Problems:   Acute on chronic diastolic (congestive) heart failure (HCC)   Significant volume overload with anasarca and acute on chronic systolic CHF exacerbation-improving -Secondary to noncompliance with home medications -Last 2D echocardiogram performed on10/16/2021 with LVEF 60-65% and grade 3 diastolic dysfunction, repeated Echo 1/18 with LVEF 35-40% -Continued here on Lasix IV 80 mgnow three timesdaily, for goal diuresis of 2-3L daily, difficult to evaluate given scrotal edema with frequent urination over the floor per nursing staff.  Could not place Foley catheter due to significant penile edema.  He does not allow nurses to record output. -Monitor daily weights and strict I's and O's -Hypoalbuminemia may also be playing a role in overall edema.  He received albumin infusion 10/28/2020. -Cardiac diet with fluid restriction -Wound care to lower extremities -PT  evaluation with recommendations for skilled nursing facility Now cardiology on board.  He remains on Entresto and IV Lasix 80 mg 3 times daily.  Now on metolazone today.  Defer further management to cardiology.  He has no shortness of breath but continues to have significant edema in bilateral lower extremities and scrotum and penis with little to no improvement in last 3 to 4 days.  He is refusing to wear telemetry.  We will DC that.  He is agreeable to go to SNF.  Filed Weights   10/30/20 0500 10/31/20 0509 11/01/20 0100  Weight: 115.5 kg 114.4 kg 114.3 kg    Mild hypoxemia -Patient became more dyspneic with some mild hypoxemia noted on 1/18, no chest pain -Chest x-ray with cardiomegaly and pulmonary vascular congestion, but no frank edema -EKG with some noted bigeminy and tachycardia, repeat EKG in am He is comfortable without any dyspnea and is not hypoxic.  Continue diuresis.  Hyperglycemia in the setting of type 2 diabetes-improved -Noncompliant with home medications Blood sugar slightly elevated.  Will increase Lantus to 13 units and continue SSI.  Dyslipidemia -Continue Lipitor  CAD with prior stent placement -Continue on aspirin, Eliquis, and Lipitor -Resume home b-blocker -Monitor on telemetry. Management per cardiology.  GERD -Famotidine  COPD -No acute bronchospasms currently noted -Continue Dulera -Duo nebs with Xopenex ordered as needed shortness of breath or wheezing  History of medication noncompliance and leaving AMA -PT/TOC evaluation for needs on dc  Protein calorie malnutrition -Dietitian consultation  CKD stage IIIa: At baseline.  Monitor.  DVT prophylaxis:Eliquis Code Status:Full Family Communication:  None at bedside.  On 10/31/2020 he asked me to give  a call to his significant other Burnette, I attempted but no response, left voicemail and I have not received a call back yet. Disposition Plan: Status is: Inpatient  Remains inpatient  appropriate because:IV treatments appropriate due to intensity of illness or inability to take PO and Inpatient level of care appropriate due to severity of illness  Dispo: The patient is from:Home Anticipated d/c is to:SNF Anticipated d/c date is:2-3days Patient currently is not medically stable to d/c.Pt requires ongoing IV diuresis due to volume overload and mild hypoxemia.  Need to monitor heart rate control on telemetry as well.   Consultants:  None  Procedures:  See below  Antimicrobials:  None  Subjective: Patient seen and examined.  No complaints other than concerned about his scrotal and penile edema and his car.  Objective: Vitals:   10/31/20 2111 11/01/20 0100 11/01/20 0525 11/01/20 0704  BP: 130/87  129/90   Pulse: 91  88   Resp: 18  19   Temp: 97.8 F (36.6 C)  98 F (36.7 C)   TempSrc: Oral     SpO2: 99%  99% 99%  Weight:  114.3 kg    Height:        Intake/Output Summary (Last 24 hours) at 11/01/2020 1222 Last data filed at 11/01/2020 0900 Gross per 24 hour  Intake 960 ml  Output 900 ml  Net 60 ml   Filed Weights   10/30/20 0500 10/31/20 0509 11/01/20 0100  Weight: 115.5 kg 114.4 kg 114.3 kg    Examination:  General exam: Appears calm and comfortable  Respiratory system: Clear to auscultation. Respiratory effort normal. Cardiovascular system: S1 & S2 heard, RRR. No JVD, murmurs, rubs, gallops or clicks.  +3 pitting edema bilateral lower extremity Gastrointestinal system: Abdomen is nondistended, soft and nontender. No organomegaly or masses felt. Normal bowel sounds heard.  Moderate scrotal and penile edema Central nervous system: Alert and oriented. No focal neurological deficits. Extremities: Symmetric 5 x 5 power. Skin: No rashes, lesions or ulcers.  Psychiatry: Judgement and insight appear normal. Mood & affect appropriate.   Data Reviewed: I have personally reviewed following labs and  imaging studies  CBC: Recent Labs  Lab 10/26/20 0613 10/27/20 0357 10/28/20 0032  WBC 8.7 7.4 7.9  HGB 10.9* 10.6* 10.4*  HCT 34.7* 34.0* 32.9*  MCV 93.5 94.2 94.3  PLT 436* 448* 401*   Basic Metabolic Panel: Recent Labs  Lab 10/26/20 0613 10/27/20 0357 10/28/20 0032 10/29/20 0457 10/30/20 0652 10/31/20 0500 11/01/20 0631  NA 132* 134* 129* 132* 134* 136 134*  K 4.1 4.2 4.3 4.2 4.2 3.9 3.6  CL 91* 92* 91* 92* 93* 94* 91*  CO2 31 31 29 30 31 31  33*  GLUCOSE 73 25* 267* 98 129* 134* 149*  BUN 31* 36* 41* 39* 40* 40* 37*  CREATININE 1.44* 1.43* 1.54* 1.47* 1.60* 1.48* 1.38*  CALCIUM 8.2* 8.5* 8.2* 8.4* 8.5* 8.7* 8.5*  MG 1.9 2.0 2.0  --   --  1.8  --    GFR: Estimated Creatinine Clearance: 81.3 mL/min (A) (by C-G formula based on SCr of 1.38 mg/dL (H)). Liver Function Tests: Recent Labs  Lab 10/29/20 0457  AST 50*  ALT 47*  ALKPHOS 118  BILITOT 0.6  PROT 6.4*  ALBUMIN 2.3*   No results for input(s): LIPASE, AMYLASE in the last 168 hours. No results for input(s): AMMONIA in the last 168 hours. Coagulation Profile: No results for input(s): INR, PROTIME in the last 168 hours. Cardiac Enzymes: No  results for input(s): CKTOTAL, CKMB, CKMBINDEX, TROPONINI in the last 168 hours. BNP (last 3 results) No results for input(s): PROBNP in the last 8760 hours. HbA1C: No results for input(s): HGBA1C in the last 72 hours. CBG: Recent Labs  Lab 10/31/20 2108 11/01/20 0248 11/01/20 0617 11/01/20 0731 11/01/20 1135  GLUCAP 232* 150* 147* 150* 208*   Lipid Profile: No results for input(s): CHOL, HDL, LDLCALC, TRIG, CHOLHDL, LDLDIRECT in the last 72 hours. Thyroid Function Tests: No results for input(s): TSH, T4TOTAL, FREET4, T3FREE, THYROIDAB in the last 72 hours. Anemia Panel: No results for input(s): VITAMINB12, FOLATE, FERRITIN, TIBC, IRON, RETICCTPCT in the last 72 hours. Sepsis Labs: No results for input(s): PROCALCITON, LATICACIDVEN in the last 168  hours.  No results found for this or any previous visit (from the past 240 hour(s)).  Radiology Studies: No results found. Scheduled Meds: . apixaban  5 mg Oral BID  . aspirin EC  81 mg Oral Q breakfast  . atorvastatin  80 mg Oral Daily  . busPIRone  7.5 mg Oral BID  . famotidine  20 mg Oral BID  . furosemide  80 mg Intravenous Q8H  . gabapentin  300 mg Oral TID  . insulin aspart  0-5 Units Subcutaneous QHS  . insulin aspart  0-9 Units Subcutaneous TID WC  . insulin glargine  13 Units Subcutaneous QPM  . melatonin  6 mg Oral QHS  . metoprolol succinate  25 mg Oral Daily  . mometasone-formoterol  2 puff Inhalation BID  . nystatin   Topical BID  . potassium chloride  10 mEq Oral BID  . sacubitril-valsartan  1 tablet Oral BID  . sodium chloride flush  3 mL Intravenous Q12H  . traZODone  150 mg Oral QHS   Continuous Infusions: . sodium chloride       LOS: 10 days   Time spent: 29 minutes  Darliss Cheney, MD How to contact the [facility] Attending or Consulting provider [place] or covering provider during after hours [place], for this patient?  1. Check the care team in [facility] and look for a) attending/consulting [facility] provider listed and b) the [facility] team listed 2. Log into [email or web address] and use [place]'s universal password to access. If you do not have the password, please contact the hospital operator. 3. Locate the [facility] provider you are looking for under [facility] and page to a number that you can be directly reached. 4. If you still have difficulty reaching the provider, please page the [facility] (Director on Call) for the Hospitalists listed on amion for assistance.  11/01/2020, 12:22 PM

## 2020-11-01 NOTE — Progress Notes (Addendum)
Progress Note  Patient Name: TERRIN MEDDAUGH Date of Encounter: 11/01/2020  [facility] Cardiologist: [name], MD (Inactive)   Subjective   Complains of fluid restriction.  Inpatient Medications    Scheduled Meds: . apixaban  5 mg Oral BID  . aspirin EC  81 mg Oral Q breakfast  . atorvastatin  80 mg Oral Daily  . busPIRone  7.5 mg Oral BID  . famotidine  20 mg Oral BID  . furosemide  80 mg Intravenous Q8H  . gabapentin  300 mg Oral TID  . insulin aspart  0-5 Units Subcutaneous QHS  . insulin aspart  0-9 Units Subcutaneous TID WC  . insulin glargine  13 Units Subcutaneous QPM  . melatonin  6 mg Oral QHS  . metoprolol succinate  25 mg Oral Daily  . mometasone-formoterol  2 puff Inhalation BID  . nystatin   Topical BID  . potassium chloride  10 mEq Oral BID  . sacubitril-valsartan  1 tablet Oral BID  . sodium chloride flush  3 mL Intravenous Q12H  . traZODone  150 mg Oral QHS   Continuous Infusions: . sodium chloride     PRN Meds: sodium chloride, acetaminophen **OR** acetaminophen, ipratropium-albuterol, levalbuterol, metoprolol tartrate, ondansetron **OR** ondansetron (ZOFRAN) IV, oxyCODONE, sodium chloride flush   Vital Signs    Vitals:   10/31/20 2111 11/01/20 0100 11/01/20 0525 11/01/20 0704  BP: 130/87  129/90   Pulse: 91  88   Resp: 18  19   Temp: 97.8 F (36.6 C)  98 F (36.7 C)   TempSrc: Oral     SpO2: 99%  99% 99%  Weight:  114.3 kg    Height:        Intake/Output Summary (Last 24 hours) at 11/01/2020 0752 Last data filed at 10/31/2020 2200 Gross per 24 hour  Intake 1200 ml  Output 1850 ml  Net -650 ml   Last 3 Weights 11/01/2020 10/31/2020 10/30/2020  Weight (lbs) 251 lb 15.8 oz 252 lb 3.3 oz 254 lb 10.1 oz  Weight (kg) 114.3 kg 114.4 kg 115.5 kg  Some encounter information is confidential and restricted. Go to Review Flowsheets activity to see all data.      Telemetry    Patient refuses to wear  ECG    Wide complex rhythm  with underlying LBBB - Personally Reviewed  Physical Exam   GEN: No acute distress.   Neck: increased JVD Cardiac: irregular no murmurs, rubs, or gallops.  Respiratory: rales bilaterally. GI: Soft, nontender, non-distended  MS: significant edema legs/thighs/arms/abdomen; No deformity. Neuro:  Nonfocal  Psych: Normal affect   Labs    High Sensitivity Troponin:   Recent Labs  Lab 10/26/20 1301 10/26/20 1545  TROPONINIHS 36* 34*      Chemistry Recent Labs  Lab 10/29/20 0457 10/30/20 0652 10/31/20 0500  NA 132* 134* 136  K 4.2 4.2 3.9  CL 92* 93* 94*  CO2 30 31 31   GLUCOSE 98 129* 134*  BUN 39* 40* 40*  CREATININE 1.47* 1.60* 1.48*  CALCIUM 8.4* 8.5* 8.7*  PROT 6.4*  --   --   ALBUMIN 2.3*  --   --   AST 50*  --   --   ALT 47*  --   --   ALKPHOS 118  --   --   BILITOT 0.6  --   --   GFRNONAA 56* 51* 56*  ANIONGAP 10 10 11      Hematology Recent Labs  Lab 10/26/20  7322 10/27/20 0357 10/28/20 0032  WBC 8.7 7.4 7.9  RBC 3.71* 3.61* 3.49*  HGB 10.9* 10.6* 10.4*  HCT 34.7* 34.0* 32.9*  MCV 93.5 94.2 94.3  MCH 29.4 29.4 29.8  MCHC 31.4 31.2 31.6  RDW 15.5 15.4 15.5  PLT 436* 448* 405*    BNPNo results for input(s): BNP, PROBNP in the last 168 hours.   DDimer No results for input(s): DDIMER in the last 168 hours.   Radiology    No results found.  Cardiac Studies     Echocardiogram: 07/2020 IMPRESSIONS    1. Left ventricular ejection fraction, by estimation, is 60 to 65%. The  left ventricle has normal function. The left ventricle has no regional  wall motion abnormalities. There is moderate concentric left ventricular  hypertrophy. Left ventricular  diastolic parameters are consistent with Grade III diastolic dysfunction  (restrictive).   2. Right ventricular systolic function is normal. The right ventricular  size is normal.   3. The mitral valve is normal in structure. Mild mitral valve  regurgitation.   4. The aortic valve is grossly  normal. There is mild calcification of the  aortic valve. There is mild thickening of the aortic valve. Aortic valve  regurgitation is trivial.   5. The inferior vena cava is dilated in size with <50% respiratory  variability, suggesting right atrial pressure of 15 mmHg.   Comparison(s): No significant change from prior study.    Echocardiogram: 10/2020 IMPRESSIONS     1. Left ventricular ejection fraction, by estimation, is 35 to 40%. The  left ventricle has moderately decreased function. The left ventricle  demonstrates global hypokinesis. There is mild left ventricular  hypertrophy. Left ventricular diastolic  parameters are indeterminate.   2. Right ventricular systolic function is mildly reduced. The right  ventricular size is moderately enlarged. There is moderately elevated  pulmonary artery systolic pressure.   3. The mitral valve is normal in structure. Mild mitral valve  regurgitation. No evidence of mitral stenosis.   4. The aortic valve is tricuspid. There is mild calcification of the  aortic valve. There is mild thickening of the aortic valve. Aortic valve  regurgitation is not visualized. No aortic stenosis is present.   5. Moderate pulmonary HTN, PASP is 54 mmHg.   6. The inferior vena cava is dilated in size with <50% respiratory  variability, suggesting right atrial pressure of 15 mmHg.     Patient Profile     55 y.o. male  past medical history of CAD (s/p BMS to RI in 2012, 70-80% ISR by repeat cath in 2013 with medical management recommended due to small vessel size), chronic diastolic CHF, HTN, HLD, Type 2 DM and history of DVT (occurring in 03/2019), and noncompliance who is being seen today for the evaluation of worsening cardiomyopathy at the request of Dr. Doristine Bosworth.      Assessment & Plan    Acute on chronic combined systolic and diastolic CHF LVEF 02-54% due to noncompliance. I/O's negative 4.7L since admission, negative 650 yest. On Lasix 80 mg IV q 8  hrs, toprol, entresto. Weight 251 lbs today, was 222 07/2020.Still has a lot of edema. ? IV lasix infusion  CAD s/p BMS to RI in 2012 with 70-80% ISR by repeat cath in 2013 with medical management recommended due to small vessel size. Hs Troponin values have been flat at 36 and 34 this admission -no chest pain  WCT ? Atypical Atrial fllutter vs VT-refuses to wear monitor and last EKG  10/27/20 wide complex rhythm with underlying LBB  HTN BP stable  History of DVT on Eliquis  CKD stage 3 Crt 1.48 yest        For questions or updates, please contact [facility] Please consult [email or web address] for contact info under        Signed, Ermalinda Barrios, PA-C  11/01/2020, 7:52 AM    Attending note Patient seen and discussed with PA Bonnell Public, I agree with her documentation. Admitted with acute on chronic combined systolic/diastolic HF, management has been severely complicated over time by noncompliance. Patient not cooperating with I/Os measuring or telemetry monitoring. Weights are trending down, renal function overall stable, he is on IV lasix 80mg  tid. Dose metolazone 2.5mg  x 1 today.  Other medical therapy with toprol 25, entresto 24/26mg  bid. Has had some WCT, not cooperating with wearing telemetry so not able to monitor this admission. He does have an LBBB at baseline. Continue beta blocker, with 1st degree AV block careful with any titration.   Carlyle Dolly MD

## 2020-11-02 DIAGNOSIS — I5033 Acute on chronic diastolic (congestive) heart failure: Secondary | ICD-10-CM | POA: Diagnosis not present

## 2020-11-02 LAB — BASIC METABOLIC PANEL
Anion gap: 8 (ref 5–15)
BUN: 31 mg/dL — ABNORMAL HIGH (ref 6–20)
CO2: 34 mmol/L — ABNORMAL HIGH (ref 22–32)
Calcium: 8.6 mg/dL — ABNORMAL LOW (ref 8.9–10.3)
Chloride: 90 mmol/L — ABNORMAL LOW (ref 98–111)
Creatinine, Ser: 1.28 mg/dL — ABNORMAL HIGH (ref 0.61–1.24)
GFR, Estimated: >60 mL/min (ref >60)
Glucose, Bld: 196 mg/dL — ABNORMAL HIGH (ref 70–99)
Potassium: 3.5 mmol/L (ref 3.5–5.1)
Sodium: 132 mmol/L — ABNORMAL LOW (ref 135–145)

## 2020-11-02 LAB — GLUCOSE, CAPILLARY
Glucose-Capillary: 183 mg/dL — ABNORMAL HIGH (ref 70–99)
Glucose-Capillary: 184 mg/dL — ABNORMAL HIGH (ref 70–99)
Glucose-Capillary: 256 mg/dL — ABNORMAL HIGH (ref 70–99)
Glucose-Capillary: 313 mg/dL — ABNORMAL HIGH (ref 70–99)
Glucose-Capillary: 92 mg/dL (ref 70–99)

## 2020-11-02 LAB — MAGNESIUM: Magnesium: 1.7 mg/dL (ref 1.7–2.4)

## 2020-11-02 MED ORDER — INSULIN GLARGINE 100 UNIT/ML ~~LOC~~ SOLN
15.0000 [IU] | Freq: Every evening | SUBCUTANEOUS | Status: DC
  Administered 2020-11-02: 15 [IU] via SUBCUTANEOUS
  Filled 2020-11-02 (×2): qty 0.15

## 2020-11-02 MED ORDER — METOLAZONE 5 MG PO TABS
2.5000 mg | ORAL_TABLET | Freq: Once | ORAL | Status: AC
  Administered 2020-11-02: 2.5 mg via ORAL
  Filled 2020-11-02: qty 1

## 2020-11-02 NOTE — Progress Notes (Signed)
Inpatient Diabetes Program Recommendations  AACE/ADA: New Consensus Statement on Inpatient Glycemic Control (2015)  Target Ranges:  Prepandial:   less than 140 mg/dL      Peak postprandial:   less than 180 mg/dL (1-2 hours)      Critically ill patients:  140 - 180 mg/dL   Lab Results  Component Value Date   GLUCAP 184 (H) 11/02/2020   HGBA1C 14.0 (H) 09/27/2020    Review of Glycemic Control Results for Dustin Cruz, Dustin Cruz (MRN [id number]) as of 11/02/2020 10:50  Ref. Range 11/01/2020 21:18 11/02/2020 02:20 11/02/2020 07:40  Glucose-Capillary Latest Ref Range: 70 - 99 mg/dL 237 (H) 183 (H) 184 (H)   Diabetes history: DM2 Outpatient Diabetes medications: Lantus 40 units QPM, Metformin 500 mg BID Current orders for Inpatient glycemic control: Lantus 13 units QPM, Novolog 0-9 units TID with meals, Novolog 0-5 units QHS  Inpatient Diabetes Program Recommendations:    Insulin: If post prandial glucose remains over 180 mg/dl, please consider ordering Novolog 3 units TID with meals for meal coverage if patient eats at least 50% of meals.  Thanks, Bronson Curb, MSN, RNC-OB Diabetes Coordinator [phone] (8a-5p)

## 2020-11-02 NOTE — Progress Notes (Signed)
PROGRESS NOTE    AYSON Cruz  ZOX:[id number] DOB: 11-Jul-1966 DOA: 10/22/2020 PCP: [name], DO   Brief Narrative:  Per HPI: Dustin Cruz a 55 y.o.malewith medical history significant forCAD, type 2 diabetes, diastolic heart failure, GERD, and COPD who presented to the ED with worsening lower extremity edema as well as abdominal edema and scrotal edema as well as some weakness. He is currently homeless and lives in his car. He states that he had run out of his Lasix as well as his diabetes medication for the last few days. It appears that on last admission on 09/2020 that he left AMA as he was being treated for edema at that time. He denies any fever, chills, abdominal pain, nausea, vomiting, chest pain, or other concerns.  -Patient has been admitted with significant volume overload/anasarca in the setting of acute on chronic diastolic CHF exacerbation with poor compliance on home medications.  He was also noted to have some initial hypoglycemia that has now resolved.  Assessment & Plan:   Active Problems:   Acute on chronic diastolic (congestive) heart failure (HCC)   Significant volume overload with anasarca and acute on chronic systolic CHF exacerbation-improving -Secondary to noncompliance with home medications -Last 2D echocardiogram performed on10/16/2021 with LVEF 60-65% and grade 3 diastolic dysfunction, repeated Echo 1/18 with LVEF 35-40% -Continued here on Lasix IV 80 mgnow three timesdaily, for goal diuresis of 2-3L daily, difficult to evaluate given scrotal edema with frequent urination over the floor per nursing staff.  Could not place Foley catheter due to significant penile edema.  He does not allow nurses to record output. -Monitor daily weights and strict I's and O's -Hypoalbuminemia may also be playing a role in overall edema.  He received albumin infusion 10/28/2020. -Cardiac diet with fluid restriction -Wound care to lower extremities -PT  evaluation with recommendations for skilled nursing facility Now cardiology on board.  He remains on Entresto and IV Lasix 80 mg 3 times daily.  Now on metolazone started yesterday and will likely get another dose today.  He has had some improvement in his scrotal and penile edema.  Inaccurate I's and O's as he does not allow the staff to measure it accurately..  Defer further management to cardiology.   He is refusing to wear telemetry. He is agreeable to go to SNF.  Filed Weights   11/01/20 0100 11/02/20 0533 11/02/20 0700  Weight: 114.3 kg 114.3 kg 111.7 kg   3. Wide-Complex Tachycardia/Possible Atypical Atrial Flutter - He had up to 25 beats of NSVT on 10/27/2020 and was hypoglycemic at 25 during that time. He has since refused to wear his telemetry monitor. Not felt to be a candidate for an ICD given his medication noncompliance and noncompliance with visits as an outpatient. He has been started on Toprol-XL 25mg  daily.  - Was already on Eliquis 5mg  BID prior to admission given history of a DVT and will continue with anticoagulation at this time in the setting of possible atrial flutter.  He is refusing to wear telemetry.   Mild hypoxemia -Patient became more dyspneic with some mild hypoxemia noted on 1/18, no chest pain -Chest x-ray with cardiomegaly and pulmonary vascular congestion, but no frank edema -EKG with some noted bigeminy and tachycardia, repeat EKG in am He is comfortable without any dyspnea and is not hypoxic.  Continue diuresis.  Hyperglycemia in the setting of type 2 diabetes-improved -Noncompliant with home medications Blood sugar slightly elevated.  Will bump Lantus back to  15 units from 13 units.  Have to be very careful bumping Lantus as he had some hypoglycemia before.  Reportedly, he is ordering food/pizza from outside restaurants intermittently.  This makes it very challenging to manage his diabetes.  Dyslipidemia -Continue Lipitor  CAD with prior stent  placement -Continue on aspirin, Eliquis, and Lipitor -Resume home b-blocker -Monitor on telemetry. Management per cardiology.  GERD -Famotidine  COPD -No acute bronchospasms currently noted -Continue Dulera -Duo nebs with Xopenex ordered as needed shortness of breath or wheezing  History of medication noncompliance and leaving AMA -PT/TOC evaluation for needs on dc  Protein calorie malnutrition -Dietitian consultation  CKD stage IIIa: At baseline.  Monitor.  DVT prophylaxis:Eliquis Code Status:Full Family Communication:  None at bedside.  On 10/31/2020 he asked me to give a call to his significant other Burnette, I attempted but no response, left voicemail and I have not received a call back yet. Disposition Plan: Status is: Inpatient  Remains inpatient appropriate because:IV treatments appropriate due to intensity of illness or inability to take PO and Inpatient level of care appropriate due to severity of illness  Dispo: The patient is from:Home Anticipated d/c is to:SNF Anticipated d/c date is:2-3days Patient currently is not medically stable to d/c.Pt requires ongoing IV diuresis due to volume overload and mild hypoxemia.  Need to monitor heart rate control on telemetry as well.  Will be discharged when cleared by cardiology.   Consultants:  None  Procedures:  See below  Antimicrobials:  None  Subjective: Seen and examined.  No complaints.  He is happy that his scrotal edema is improving now.  Objective: Vitals:   11/01/20 2116 11/02/20 0533 11/02/20 0700 11/02/20 0835  BP: (!) 133/96 110/86    Pulse: 92 84    Resp: 19 19    Temp: 98.4 F (36.9 C) 98 F (36.7 C)    TempSrc: Oral Oral    SpO2: 99% 100%  96%  Weight:  114.3 kg 111.7 kg   Height:        Intake/Output Summary (Last 24 hours) at 11/02/2020 1051 Last data filed at 11/02/2020 0900 Gross per 24 hour  Intake 2020 ml  Output  2300 ml  Net -280 ml   Filed Weights   11/01/20 0100 11/02/20 0533 11/02/20 0700  Weight: 114.3 kg 114.3 kg 111.7 kg    Examination:  General exam: Appears calm and comfortable  Respiratory system: Clear to auscultation. Respiratory effort normal. Cardiovascular system: S1 & S2 heard, RRR. No JVD, murmurs, rubs, gallops or clicks.  +2-3 pitting edema bilateral lower extremity Gastrointestinal system: Abdomen is nondistended, soft and nontender. No organomegaly or masses felt. Normal bowel sounds heard.  Moderate to severe penile and scrotal edema. Central nervous system: Alert and oriented. No focal neurological deficits. Extremities: Symmetric 5 x 5 power.   Data Reviewed: I have personally reviewed following labs and imaging studies  CBC: Recent Labs  Lab 10/27/20 0357 10/28/20 0032  WBC 7.4 7.9  HGB 10.6* 10.4*  HCT 34.0* 32.9*  MCV 94.2 94.3  PLT 448* 123456*   Basic Metabolic Panel: Recent Labs  Lab 10/27/20 0357 10/28/20 0032 10/29/20 0457 10/30/20 0652 10/31/20 0500 11/01/20 0631 11/02/20 0933  NA 134* 129* 132* 134* 136 134* 132*  K 4.2 4.3 4.2 4.2 3.9 3.6 3.5  CL 92* 91* 92* 93* 94* 91* 90*  CO2 31 29 30 31 31  33* 34*  GLUCOSE 25* 267* 98 129* 134* 149* 196*  BUN 36* 41* 39* 40*  40* 37* 31*  CREATININE 1.43* 1.54* 1.47* 1.60* 1.48* 1.38* 1.28*  CALCIUM 8.5* 8.2* 8.4* 8.5* 8.7* 8.5* 8.6*  MG 2.0 2.0  --   --  1.8  --  1.7   GFR: Estimated Creatinine Clearance: 86.7 mL/min (A) (by C-G formula based on SCr of 1.28 mg/dL (H)). Liver Function Tests: Recent Labs  Lab 10/29/20 0457  AST 50*  ALT 47*  ALKPHOS 118  BILITOT 0.6  PROT 6.4*  ALBUMIN 2.3*   No results for input(s): LIPASE, AMYLASE in the last 168 hours. No results for input(s): AMMONIA in the last 168 hours. Coagulation Profile: No results for input(s): INR, PROTIME in the last 168 hours. Cardiac Enzymes: No results for input(s): CKTOTAL, CKMB, CKMBINDEX, TROPONINI in the last 168  hours. BNP (last 3 results) No results for input(s): PROBNP in the last 8760 hours. HbA1C: No results for input(s): HGBA1C in the last 72 hours. CBG: Recent Labs  Lab 11/01/20 1135 11/01/20 1639 11/01/20 2118 11/02/20 0220 11/02/20 0740  GLUCAP 208* 286* 237* 183* 184*   Lipid Profile: No results for input(s): CHOL, HDL, LDLCALC, TRIG, CHOLHDL, LDLDIRECT in the last 72 hours. Thyroid Function Tests: No results for input(s): TSH, T4TOTAL, FREET4, T3FREE, THYROIDAB in the last 72 hours. Anemia Panel: No results for input(s): VITAMINB12, FOLATE, FERRITIN, TIBC, IRON, RETICCTPCT in the last 72 hours. Sepsis Labs: No results for input(s): PROCALCITON, LATICACIDVEN in the last 168 hours.  No results found for this or any previous visit (from the past 240 hour(s)).  Radiology Studies: No results found. Scheduled Meds: . apixaban  5 mg Oral BID  . atorvastatin  80 mg Oral Daily  . busPIRone  7.5 mg Oral BID  . famotidine  20 mg Oral BID  . furosemide  80 mg Intravenous Q8H  . gabapentin  300 mg Oral TID  . insulin aspart  0-5 Units Subcutaneous QHS  . insulin aspart  0-9 Units Subcutaneous TID WC  . insulin glargine  13 Units Subcutaneous QPM  . melatonin  6 mg Oral QHS  . metolazone  2.5 mg Oral Once  . metoprolol succinate  25 mg Oral Daily  . mometasone-formoterol  2 puff Inhalation BID  . nystatin   Topical BID  . potassium chloride  10 mEq Oral BID  . sacubitril-valsartan  1 tablet Oral BID  . sodium chloride flush  3 mL Intravenous Q12H  . traZODone  150 mg Oral QHS   Continuous Infusions: . sodium chloride       LOS: 11 days   Time spent: 28 minutes  Darliss Cheney, MD How to contact the [facility] Attending or Consulting provider [place] or covering provider during after hours [place], for this patient?  1. Check the care team in [facility] and look for a) attending/consulting [facility] provider listed and b) the [facility] team listed 2. Log into [email or web address] and use [place]'s  universal password to access. If you do not have the password, please contact the hospital operator. 3. Locate the [facility] provider you are looking for under [facility] and page to a number that you can be directly reached. 4. If you still have difficulty reaching the provider, please page the [facility] (Director on Call) for the Hospitalists listed on amion for assistance.  11/02/2020, 10:51 AM

## 2020-11-02 NOTE — Progress Notes (Addendum)
Progress Note  Patient Name: Dustin Cruz Date of Encounter: 11/02/2020  Primary Cardiologist: Rozann Lesches, MD   Subjective   Breathing improved. No chest pain or palpitations. Lower extremity edema continues to improve. He has refused to wear telemetry.   Inpatient Medications    Scheduled Meds: . apixaban  5 mg Oral BID  . aspirin EC  81 mg Oral Q breakfast  . atorvastatin  80 mg Oral Daily  . busPIRone  7.5 mg Oral BID  . famotidine  20 mg Oral BID  . furosemide  80 mg Intravenous Q8H  . gabapentin  300 mg Oral TID  . insulin aspart  0-5 Units Subcutaneous QHS  . insulin aspart  0-9 Units Subcutaneous TID WC  . insulin glargine  13 Units Subcutaneous QPM  . melatonin  6 mg Oral QHS  . metoprolol succinate  25 mg Oral Daily  . mometasone-formoterol  2 puff Inhalation BID  . nystatin   Topical BID  . potassium chloride  10 mEq Oral BID  . sacubitril-valsartan  1 tablet Oral BID  . sodium chloride flush  3 mL Intravenous Q12H  . traZODone  150 mg Oral QHS   Continuous Infusions: . sodium chloride     PRN Meds: sodium chloride, acetaminophen **OR** acetaminophen, ipratropium-albuterol, levalbuterol, metoprolol tartrate, ondansetron **OR** ondansetron (ZOFRAN) IV, oxyCODONE, sodium chloride flush   Vital Signs    Vitals:   11/01/20 2116 11/02/20 0533 11/02/20 0700 11/02/20 0835  BP: (!) 133/96 110/86    Pulse: 92 84    Resp: 19 19    Temp: 98.4 F (36.9 C) 98 F (36.7 C)    TempSrc: Oral Oral    SpO2: 99% 100%  96%  Weight:  114.3 kg 111.7 kg   Height:        Intake/Output Summary (Last 24 hours) at 11/02/2020 0910 Last data filed at 11/02/2020 0452 Gross per 24 hour  Intake 1540 ml  Output 2300 ml  Net -760 ml    Last 3 Weights 11/02/2020 11/02/2020 11/01/2020  Weight (lbs) 246 lb 4.1 oz 251 lb 15.8 oz 251 lb 15.8 oz  Weight (kg) 111.7 kg 114.3 kg 114.3 kg  Some encounter information is confidential and restricted. Go to Review Flowsheets activity to  see all data.      Telemetry    Patient refuses to wear telemetry.   ECG   No new tracings.   Physical Exam   General: Well developed male appearing in no acute distress. Head: Normocephalic, atraumatic.  Neck: Supple without bruits, JVD at 9 cm Lungs:  Resp regular and unlabored, rales along bases bilaterally. Heart: RRR, S1, S2, no S3, S4, or murmur; no rub. Abdomen: Soft, non-tender, non-distended. Extremities: No clubbing or cyanosis, 2+ pitting edema with dressings in place. Distal pedal pulses are 2+ bilaterally. Neuro: Alert and oriented X 3. Moves all extremities spontaneously. Psych: Normal affect.  Labs    Chemistry Recent Labs  Lab 10/29/20 0457 10/30/20 0652 10/31/20 0500 11/01/20 0631  NA 132* 134* 136 134*  K 4.2 4.2 3.9 3.6  CL 92* 93* 94* 91*  CO2 30 31 31  33*  GLUCOSE 98 129* 134* 149*  BUN 39* 40* 40* 37*  CREATININE 1.47* 1.60* 1.48* 1.38*  CALCIUM 8.4* 8.5* 8.7* 8.5*  PROT 6.4*  --   --   --   ALBUMIN 2.3*  --   --   --   AST 50*  --   --   --  ALT 47*  --   --   --   ALKPHOS 118  --   --   --   BILITOT 0.6  --   --   --   GFRNONAA 56* 51* 56* >60  ANIONGAP 10 10 11 10      Hematology Recent Labs  Lab 10/27/20 0357 10/28/20 0032  WBC 7.4 7.9  RBC 3.61* 3.49*  HGB 10.6* 10.4*  HCT 34.0* 32.9*  MCV 94.2 94.3  MCH 29.4 29.8  MCHC 31.2 31.6  RDW 15.4 15.5  PLT 448* 405*    Cardiac EnzymesNo results for input(s): TROPONINI in the last 168 hours. No results for input(s): TROPIPOC in the last 168 hours.   BNPNo results for input(s): BNP, PROBNP in the last 168 hours.   DDimer No results for input(s): DDIMER in the last 168 hours.   Radiology    No results found.  Cardiac Studies   Echocardiogram: 10/26/2020 IMPRESSIONS    1. Left ventricular ejection fraction, by estimation, is 35 to 40%. The  left ventricle has moderately decreased function. The left ventricle  demonstrates global hypokinesis. There is mild left  ventricular  hypertrophy. Left ventricular diastolic  parameters are indeterminate.  2. Right ventricular systolic function is mildly reduced. The right  ventricular size is moderately enlarged. There is moderately elevated  pulmonary artery systolic pressure.  3. The mitral valve is normal in structure. Mild mitral valve  regurgitation. No evidence of mitral stenosis.  4. The aortic valve is tricuspid. There is mild calcification of the  aortic valve. There is mild thickening of the aortic valve. Aortic valve  regurgitation is not visualized. No aortic stenosis is present.  5. Moderate pulmonary HTN, PASP is 54 mmHg.  6. The inferior vena cava is dilated in size with <50% respiratory  variability, suggesting right atrial pressure of 15 mmHg.   Patient Profile     55 y.o. male w/ PMH of CAD(s/p BMS to [place] in 2012, 70-80% ISR by repeat cath in 2013 with medical management recommended due to small vessel size), chronic diastolic CHF, HTN, HLD, Type 2 DMand history of DVT (occurring in 03/2019) and noncompliance who is currently admitted with a CHF exacerbation and repeat echo shows a reduced EF of 35-40%.   Assessment & Plan    1. Acute on Chronic Combined Systolic and Diastolic CHF - BNP was at 1662 on admission and repeat echo shows a reduced EF of 35-40% with global HK and mildly reduced RV function.  - Currently on IV Lasix 80mg  TID and did receive Metolazone 2.5mg  on 11/01/2020. I&O's not fully accurate as patient has not been compliant with allowing staff to measure output. Recorded as -2.3L yesterday but weight declined by 5 lbs. His weight was at 257 lbs on admission, down to 246 lbs today. Was previously at 221 lbs in 07/2020 so would continue with IV diuresis. Pending repeat BMET for this AM, would redose Metolazone 2.5mg  this AM if renal function is overall stable.  - Continue Toprol-XL 25mg  daily and Entresto 24-26mg  BID. Given BP at 110/86 this AM, would not further titrate at  this time. Can perhaps add Spironolactone once no longer requiring high-dose Lasix.   2. CAD - He is s/p BMS to RI in 2012 with 70-80% ISR by repeat cath in 2013 with medical management recommended due to small vessel size. Troponin values have been flat at 36 and 34. - Continue current medication regimen with Atorvastatin 80mg  daily and Toprol-XL 25mg  daily.  Will discuss with Dr. Harl Bowie in regards to stopping ASA given the concurrent use of anticoagulation.   3. Wide-Complex Tachycardia/Possible Atypical Atrial Flutter - He had up to 25 beats of NSVT on 10/27/2020 and was hypoglycemic at 25 during that time. He has since refused to wear his telemetry monitor. Not felt to be a candidate for an ICD given his medication noncompliance and noncompliance with visits as an outpatient. He has been started on Toprol-XL 25mg  daily.  - Was already on Eliquis 5mg  BID prior to admission given history of a DVT and will continue with anticoagulation at this time in the setting of possible atrial flutter. Patient refuses to wear telemetry.   4. HTN - BP has been stable at 110/86 - 133/96 within the past 24 hours. Continue Toprol-XL 25mg  daily and Entresto 24-26mg  BID.   5. History of DVT - He was restarted on Eliquis 5mg  BID for anticoagulation this admission.   6. Stage 3 CKD - Baseline creatinine 1.2 - 1.3. Peaked at 1.60 this admission, improved to 1.38 on 1/24. Repeat BMET pending for this AM.    For questions or updates, please contact [place] Please consult [email or web address] for contact info under Cardiology/STEMI.   Arna Medici , PA-C 9:10 AM 11/02/2020 Pager: [phone]  Attending Note Patient seen and discussed with PA Ahmed Prima, I agree with her documentaiton. Admitted with acute on chronic combined systolic/diastolic HF, management has been severely complicated over time by noncompliance. Patient not cooperating with I/Os measuring or telemetry monitoring.  He is on  IV lasix 80mg  tid, received metolazone 2.5mg . Incomplete I/Os yeseterday, at least 2.3 L uop. Downtrend in Cr, will redose oral metolazone 2.5mg  today. Continue other medical therapy   Carlyle Dolly MD

## 2020-11-02 NOTE — TOC Progression Note (Signed)
Transition of Care [facility]) - Progression Note    Patient Details  Name: Dustin Cruz MRN: [id number] Date of Birth: 21-Aug-1966  [facility] CM/SW Contact  Salome Arnt, [place] Phone Number: 11/02/2020, 11:01 AM  Clinical Narrative:  LCSW updated [place] on pt. Anticipate d/c in several days. TOC will start insurance authorization prior to d/c.     Expected Discharge Plan: [place] Barriers to Discharge: Continued Medical Work up  Expected Discharge Plan and Services Expected Discharge Plan: [place] In-house Referral: Clinical Social Work Discharge Planning Services: NA Post Acute Care Choice: NA Living arrangements for the past 2 months: Homeless                 DME Arranged: N/A DME Agency: NA       HH Arranged: NA HH Agency: NA         Social Determinants of Health (SDOH) Interventions    Readmission Risk Interventions Readmission Risk Prevention Plan 10/25/2020 09/29/2020  Transportation Screening Complete Complete  Medication Review Press photographer) Complete Complete  HRI or Home Care Consult Complete Complete  SW Recovery Care/Counseling Consult Complete Complete  Palliative Care Screening Not Applicable Not Applicable  Skilled Nursing Facility Not Applicable Not Complete  Some recent data might be hidden

## 2020-11-02 NOTE — Progress Notes (Signed)
Nutrition follow-up  DOCUMENTATION CODES:   Obesity unspecified  INTERVENTION:  Provided Low Sodium diet handout/review  Recommend nursing provide: Booklet "Living Well with Heart Failure"  Provide: Heart Failure Management and Sodium and fluid intake videos for reinforcement   NUTRITION DIAGNOSIS:   Food and nutrition related knowledge deficit related to limited prior education (CHF) as evidenced by per patient/family report/ diet history.   GOAL:   (Patient will follow low sodium /CHO modifed diet)  MONITOR:   PO intake,Weight trends,I & O's,Labs,Skin   ASSESSMENT: Patient is a 55 yo male with hx of DM-2, CHF, CKD-3, COPD, Chronic depression, CAD. Patient is homeless and has been living in his car. He presents with volume overload, non-compliance. Acute on chronic CHF.   Patient eats out daily. He likes hamburgers and chicken nuggets. Provided handout - Low Sodium diet and recommended fluid intake. Currently he is on a 1.5 liter fluid restriction. His appetite is excellent and po's 100% of all meals.   1/25 -Patient continue to eat 100% of meals. Discharge planning in process and he is still diuresing. No changes recommended to nutrition plan.  Medications reviewed and include: lipitor, buspar,  trazodone, pepcid, Insulin (novolog and lantus).  Labs 1/25 reviewed: A1C-14%, sodium 132 (L), BUN 31, Cr 1.28 (H). Glucose 196 (H). CBG's-237, 183, 184, 256.  Patient estimated dry weight unknown. Weight history reviewed. He has moderate pitting edema BLE and upper body.    Intake/Output Summary (Last 24 hours) at 11/02/2020 1526 Last data filed at 11/02/2020 1300 Gross per 24 hour  Intake 2020 ml  Output 2300 ml  Net -280 ml   Since admission: -4.526 liters  Diet Order:   Diet Order            Diet heart healthy/carb modified Room service appropriate? Yes; Fluid consistency: Thin; Fluid restriction: 1500 mL Fluid  Diet effective now                EDUCATION NEEDS:   Education needs have been addressed   Skin:  Skin Assessment: Reviewed RN Assessment  Last BM:  1/23  Height:   Ht Readings from Last 1 Encounters:  10/23/20 6\' 2"  (1.88 m)    Weight:   Wt Readings from Last 1 Encounters:  11/02/20 111.7 kg    Ideal Body Weight:   86 kg  BMI:  Body mass index is 31.62 kg/m.  Estimated Nutritional Needs:   Kcal:  2074-2281  Protein:  112-120 gr  Fluid:  1500 ml daily per MD  Colman Cater MS,RD,CSG,LDN Pager: Shea Evans

## 2020-11-03 ENCOUNTER — Telehealth: Payer: Self-pay

## 2020-11-03 DIAGNOSIS — Z9114 Patient's other noncompliance with medication regimen: Secondary | ICD-10-CM

## 2020-11-03 DIAGNOSIS — I5033 Acute on chronic diastolic (congestive) heart failure: Secondary | ICD-10-CM | POA: Diagnosis not present

## 2020-11-03 DIAGNOSIS — R601 Generalized edema: Secondary | ICD-10-CM | POA: Diagnosis not present

## 2020-11-03 LAB — BASIC METABOLIC PANEL
Anion gap: 12 (ref 5–15)
BUN: 31 mg/dL — ABNORMAL HIGH (ref 6–20)
CO2: 31 mmol/L (ref 22–32)
Calcium: 8.6 mg/dL — ABNORMAL LOW (ref 8.9–10.3)
Chloride: 91 mmol/L — ABNORMAL LOW (ref 98–111)
Creatinine, Ser: 1.24 mg/dL (ref 0.61–1.24)
GFR, Estimated: >60 mL/min (ref >60)
Glucose, Bld: 86 mg/dL (ref 70–99)
Potassium: 3.8 mmol/L (ref 3.5–5.1)
Sodium: 134 mmol/L — ABNORMAL LOW (ref 135–145)

## 2020-11-03 LAB — GLUCOSE, CAPILLARY
Glucose-Capillary: 49 mg/dL — ABNORMAL LOW (ref 70–99)
Glucose-Capillary: 88 mg/dL (ref 70–99)
Glucose-Capillary: 86 mg/dL (ref 70–99)
Glucose-Capillary: 177 mg/dL — ABNORMAL HIGH (ref 70–99)
Glucose-Capillary: 255 mg/dL — ABNORMAL HIGH (ref 70–99)
Glucose-Capillary: 133 mg/dL — ABNORMAL HIGH (ref 70–99)

## 2020-11-03 MED ORDER — HYDROCERIN EX CREA
TOPICAL_CREAM | Freq: Two times a day (BID) | CUTANEOUS | Status: DC
  Administered 2020-11-06 – 2020-11-17 (×4): 1 via TOPICAL
  Filled 2020-11-03: qty 113

## 2020-11-03 MED ORDER — INSULIN ASPART 100 UNIT/ML ~~LOC~~ SOLN
3.0000 [IU] | Freq: Three times a day (TID) | SUBCUTANEOUS | Status: DC
  Administered 2020-11-03 – 2020-11-07 (×12): 3 [IU] via SUBCUTANEOUS

## 2020-11-03 MED ORDER — METOLAZONE 5 MG PO TABS
2.5000 mg | ORAL_TABLET | Freq: Once | ORAL | Status: AC
  Administered 2020-11-03: 2.5 mg via ORAL
  Filled 2020-11-03: qty 1

## 2020-11-03 MED ORDER — INSULIN GLARGINE 100 UNIT/ML ~~LOC~~ SOLN
12.0000 [IU] | Freq: Every evening | SUBCUTANEOUS | Status: DC
  Administered 2020-11-03: 12 [IU] via SUBCUTANEOUS
  Filled 2020-11-03 (×2): qty 0.12

## 2020-11-03 NOTE — Telephone Encounter (Signed)
[facility] called for clarification :  They have changed the [place] on this med - Spoke with Davina and she will run this on the new [place] # and call us back if there are any issues.

## 2020-11-03 NOTE — Progress Notes (Signed)
Pt OOB ambulating in hall with supervision from staff. Gait steady with assistance from quad cane. Ambulates well.

## 2020-11-03 NOTE — Progress Notes (Signed)
I was in patient's room and he stated he needed to go to the bathroom. He grabbed his cane and ambulated to the bathroom with no problem. I stepped away from the door to allow him some privacy. After a short period I heard him fall. His cane was in the not in his hand and was actually near the far wall. He stated he was reaching for it however he was found in the door way. He stated he did not hit anything. He did have a skin tear to his left upper arm and a dressing was applied. Vital signs were obtained. I told him he was not to get back out of bed. Floor mats were placed by the bed and male purewick was placed. To keep him from pulling off the purewick we placed mitts on his hands. Dr was notified.

## 2020-11-03 NOTE — Telephone Encounter (Signed)
Discontinued from the market??  I'm confused.  Additionally, patient is currently hospitalized.

## 2020-11-03 NOTE — Progress Notes (Signed)
Progress Note  Patient Name: Dustin Cruz Date of Encounter: 11/03/2020  [facility] Cardiologist: Rozann Lesches, MD   Subjective   Breathing improving, swelling improving.   Inpatient Medications    Scheduled Meds: . apixaban  5 mg Oral BID  . atorvastatin  80 mg Oral Daily  . busPIRone  7.5 mg Oral BID  . famotidine  20 mg Oral BID  . furosemide  80 mg Intravenous Q8H  . gabapentin  300 mg Oral TID  . hydrocerin   Topical BID  . insulin aspart  0-5 Units Subcutaneous QHS  . insulin aspart  0-9 Units Subcutaneous TID WC  . insulin aspart  3 Units Subcutaneous TID WC  . insulin glargine  12 Units Subcutaneous QPM  . melatonin  6 mg Oral QHS  . metoprolol succinate  25 mg Oral Daily  . mometasone-formoterol  2 puff Inhalation BID  . nystatin   Topical BID  . potassium chloride  10 mEq Oral BID  . sacubitril-valsartan  1 tablet Oral BID  . sodium chloride flush  3 mL Intravenous Q12H  . traZODone  150 mg Oral QHS   Continuous Infusions: . sodium chloride     PRN Meds: sodium chloride, acetaminophen **OR** acetaminophen, ipratropium-albuterol, levalbuterol, metoprolol tartrate, ondansetron **OR** ondansetron (ZOFRAN) IV, oxyCODONE, sodium chloride flush   Vital Signs    Vitals:   11/02/20 2110 11/02/20 2200 11/03/20 0513 11/03/20 0725  BP:  115/80 113/75   Pulse:  81 89   Resp:  20 20   Temp:  97.7 F (36.5 C) 98.4 F (36.9 C)   TempSrc:  Oral Oral   SpO2: 95% 97% 97% 97%  Weight:   107.3 kg   Height:        Intake/Output Summary (Last 24 hours) at 11/03/2020 0944 Last data filed at 11/03/2020 0900 Gross per 24 hour  Intake 1085 ml  Output --  Net 1085 ml   Last 3 Weights 11/03/2020 11/02/2020 11/02/2020  Weight (lbs) 236 lb 8.9 oz 246 lb 4.1 oz 251 lb 15.8 oz  Weight (kg) 107.3 kg 111.7 kg 114.3 kg  Some encounter information is confidential and restricted. Go to Review Flowsheets activity to see all data.      Telemetry    Pt refuses -  Personally Reviewed  ECG    n/a - Personally Reviewed  Physical Exam   GEN: No acute distress.   Neck: elevated JVD Cardiac: RRR, no murmurs, rubs, or gallops.  Respiratory: Clear to auscultation bilaterally. GI: Soft, nontender, non-distended  MS: 2+ bialtearl LE edema; No deformity. Neuro:  Nonfocal  Psych: Normal affect   Labs    High Sensitivity Troponin:   Recent Labs  Lab 10/26/20 1301 10/26/20 1545  TROPONINIHS 36* 34*      Chemistry Recent Labs  Lab 10/29/20 0457 10/30/20 0652 11/01/20 0631 11/02/20 0933 11/03/20 0411  NA 132*   < > 134* 132* 134*  K 4.2   < > 3.6 3.5 3.8  CL 92*   < > 91* 90* 91*  CO2 30   < > 33* 34* 31  GLUCOSE 98   < > 149* 196* 86  BUN 39*   < > 37* 31* 31*  CREATININE 1.47*   < > 1.38* 1.28* 1.24  CALCIUM 8.4*   < > 8.5* 8.6* 8.6*  PROT 6.4*  --   --   --   --   ALBUMIN 2.3*  --   --   --   --  AST 50*  --   --   --   --   ALT 47*  --   --   --   --   ALKPHOS 118  --   --   --   --   BILITOT 0.6  --   --   --   --   GFRNONAA 56*   < > >60 >60 >60  ANIONGAP 10   < > 10 8 12    < > = values in this interval not displayed.     Hematology Recent Labs  Lab 10/28/20 0032  WBC 7.9  RBC 3.49*  HGB 10.4*  HCT 32.9*  MCV 94.3  MCH 29.8  MCHC 31.6  RDW 15.5  PLT 405*    BNPNo results for input(s): BNP, PROBNP in the last 168 hours.   DDimer No results for input(s): DDIMER in the last 168 hours.   Radiology    No results found.  Cardiac Studies     Patient Profile     55 y.o. male w/ PMH of CAD(s/p BMS to [place] in 2012, 70-80% ISR by repeat cath in 2013 with medical management recommended due to small vessel size),chronic diastolic CHF,HTN, HLD, Type 2 DMand history of DVT (occurring in 03/2019) and noncompliancewho is currently admitted with a CHF exacerbation and repeat echo shows a reduced EF of 35-40%.   Assessment & Plan    1. Acute on chronic combined systolic/diastolic HF - - BNP was at 1662 on  admission and repeat echo shows a reduced EF of 35-40% with global HK and mildly reduced RV function.  - on IV lasix 80mg  tid, last 2 days has also received oral torsemide 2.5mg  - he has not cooperated with I/Os, no data from yesterday. Weights suggest a 10 lbs weight loss from yesterday which would be likely inaccurate. Renal function has been improving with diuresis consistent with venous congestion and CHF - medical therapy with toprol 25mg  daily, entresto 24/26mg  bid  - remains severely volume overloaded, continue IV lasix 80mg  tid. Redose metolazone 2.5mg  today.   2. CAD - He is s/p BMS to [place] in 2012with70-80% ISR by repeat cath in 2013 with medical management recommended due to small vessel size. Troponin values have been flat at 36 and 34. - Continue current medication   3. Wide-Complex Tachycardia/Possible Atypical Atrial Flutter - He had up to 25 beats of NSVT on 10/27/2020 and was hypoglycemic at 25 during that time. He has since refused to wear his telemetry monitor. Not felt to be a candidate for an ICD given his medication noncompliance and noncompliance with visits as an outpatient. He has been started on Toprol-XL 25mg  daily.  - Was already on Eliquis 5mg  BID prior to admission given history of a DVT and will continue with anticoagulation at this time in the setting of possible atrial flutter. Patient refuses to wear telemetry.   4. History of DVT - He was restarted on Eliquis 5mg  BID for anticoagulation this admission.    For questions or updates, please contact [place] Please consult [email or web address] for contact info under        Signed, Carlyle Dolly, MD  11/03/2020, 9:44 AM

## 2020-11-03 NOTE — Progress Notes (Signed)
PROGRESS NOTE    Dustin Cruz  XVQ:[id number] DOB: [birth year] DOA: 10/22/2020 PCP: [name], DO   Brief Narrative:  Per HPI: Dustin Cruz a 55 y.o.male admitted with significant volume overload/anasarca in the setting of acute on chronic diastolic CHF exacerbation with poor compliance on home medications.     Assessment & Plan:   Active Problems:   Acute on chronic diastolic (congestive) heart failure (HCC)   Significant volume overload with anasarca and acute on chronic systolic CHF exacerbation -improving -due to noncompliance with home medications -Last 2D echocardiogram performed on10/16/2021 with LVEF 60-65% and grade 3 diastolic dysfunction, repeated Echo 1/18 with LVEF 35-40% -Lasix IV 80 mgnow three timesdaily, for goal diuresis of 2-3L daily: output difficult to evaluate given scrotal edema with frequent urination over the floor per nursing staff.   -Monitor daily weights and strict I's and O's -Hypoalbuminemia may also be playing a role in overall edema.  received albumin infusion 10/28/2020. -Cardiac diet with fluid restriction -Wound care to lower extremities -PT evaluation with recommendations for skilled nursing facility -cardiology consulted: added entresto 1/21 .  metolazone dosed daily -per prior MD refusing tele  Filed Weights   11/02/20 0533 11/02/20 0700 11/03/20 0513  Weight: 114.3 kg 111.7 kg 107.3 kg    Wide-Complex Tachycardia/Possible Atypical Atrial Flutter - He had up to 25 beats of NSVT on 10/27/2020 and was hypoglycemic at 25 during that time. He has since refused to wear his telemetry monitor. Not felt to be a candidate for an ICD given his medication noncompliance and noncompliance with visits as an outpatient. He has been started on Toprol-XL 25mg  daily.  - Was already on Eliquis 5mg  BID prior to admission given history of a DVT and will continue with anticoagulation at this time in the setting of possible atrial flutter  Mild  hypoxemia -Patient became more dyspneic with some mild hypoxemia noted on 1/18, no chest pain -Chest x-ray with cardiomegaly and pulmonary vascular congestion, but no frank edema -resolved-- off O2  Hyperglycemia in the setting of type 2 diabetes-improved -Noncompliant with home medications -reportedly, he is ordering food/pizza from outside restaurants intermittently.  This makes it very challenging to manage his diabetes. -add meal coverage and decrease lantus for now  Dyslipidemia -Continue Lipitor  CAD with prior stent placement -Continue on aspirin, Eliquis, and Lipitor -Resume home b-blocker  GERD -Famotidine  COPD -No acute bronchospasms currently noted -Continue Dulera -Duo nebs with Xopenex ordered as needed shortness of breath or wheezing  CKD stage IIIa: At baseline -daily labs  DVT prophylaxis:Eliquis Code Status:Full Disposition Plan: Status is: Inpatient  Remains inpatient appropriate because:IV treatments appropriate due to intensity of illness or inability to take PO and Inpatient level of care appropriate due to severity of illness  Dispo: The patient is from:Home Anticipated d/c is to:SNF Anticipated d/c date is:1-2days Patient currently is not medically stable to d/c.Pt requires ongoing IV diuresis due to volume overload and mild hypoxemia.  Can be discharged when cleared by cardiology.   Consultants:  cards    Subjective: Episode of low blood sugar this AM  Objective: Vitals:   11/02/20 2110 11/02/20 2200 11/03/20 0513 11/03/20 0725  BP:  115/80 113/75   Pulse:  81 89   Resp:  20 20   Temp:  97.7 F (36.5 C) 98.4 F (36.9 C)   TempSrc:  Oral Oral   SpO2: 95% 97% 97% 97%  Weight:   107.3 kg   Height:  Intake/Output Summary (Last 24 hours) at 11/03/2020 0955 Last data filed at 11/03/2020 0900 Gross per 24 hour  Intake 1085 ml  Output --  Net 1085 ml   Filed Weights    11/02/20 0533 11/02/20 0700 11/03/20 0513  Weight: 114.3 kg 111.7 kg 107.3 kg    Examination:   General: Appearance:    Obese male in no acute distress     Lungs:     Few crackles at bases, respirations unlabored  Heart:    Normal heart rate. Normal rhythm. No murmurs, rubs, or gallops.   MS:   All extremities are intact. Left leg with dressing, right leg with dry skin, +LE edema  Neurologic:   Awake, alert, oriented x 3. Poor insight into disease process     Data Reviewed: I have personally reviewed following labs and imaging studies  CBC: Recent Labs  Lab 10/28/20 0032  WBC 7.9  HGB 10.4*  HCT 32.9*  MCV 94.3  PLT 366*   Basic Metabolic Panel: Recent Labs  Lab 10/28/20 0032 10/29/20 0457 10/30/20 0652 10/31/20 0500 11/01/20 0631 11/02/20 0933 11/03/20 0411  NA 129*   < > 134* 136 134* 132* 134*  K 4.3   < > 4.2 3.9 3.6 3.5 3.8  CL 91*   < > 93* 94* 91* 90* 91*  CO2 29   < > 31 31 33* 34* 31  GLUCOSE 267*   < > 129* 134* 149* 196* 86  BUN 41*   < > 40* 40* 37* 31* 31*  CREATININE 1.54*   < > 1.60* 1.48* 1.38* 1.28* 1.24  CALCIUM 8.2*   < > 8.5* 8.7* 8.5* 8.6* 8.6*  MG 2.0  --   --  1.8  --  1.7  --    < > = values in this interval not displayed.   GFR: Estimated Creatinine Clearance: 87.8 mL/min (by C-G formula based on SCr of 1.24 mg/dL). Liver Function Tests: Recent Labs  Lab 10/29/20 0457  AST 50*  ALT 47*  ALKPHOS 118  BILITOT 0.6  PROT 6.4*  ALBUMIN 2.3*   No results for input(s): LIPASE, AMYLASE in the last 168 hours. No results for input(s): AMMONIA in the last 168 hours. Coagulation Profile: No results for input(s): INR, PROTIME in the last 168 hours. Cardiac Enzymes: No results for input(s): CKTOTAL, CKMB, CKMBINDEX, TROPONINI in the last 168 hours. BNP (last 3 results) No results for input(s): PROBNP in the last 8760 hours. HbA1C: No results for input(s): HGBA1C in the last 72 hours. CBG: Recent Labs  Lab 11/02/20 1609  11/02/20 2219 11/03/20 0238 11/03/20 0334 11/03/20 0830  GLUCAP 313* 92 49* 88 86   Lipid Profile: No results for input(s): CHOL, HDL, LDLCALC, TRIG, CHOLHDL, LDLDIRECT in the last 72 hours. Thyroid Function Tests: No results for input(s): TSH, T4TOTAL, FREET4, T3FREE, THYROIDAB in the last 72 hours. Anemia Panel: No results for input(s): VITAMINB12, FOLATE, FERRITIN, TIBC, IRON, RETICCTPCT in the last 72 hours. Sepsis Labs: No results for input(s): PROCALCITON, LATICACIDVEN in the last 168 hours.  No results found for this or any previous visit (from the past 240 hour(s)).  Radiology Studies: No results found. Scheduled Meds: . apixaban  5 mg Oral BID  . atorvastatin  80 mg Oral Daily  . busPIRone  7.5 mg Oral BID  . famotidine  20 mg Oral BID  . furosemide  80 mg Intravenous Q8H  . gabapentin  300 mg Oral TID  . hydrocerin  Topical BID  . insulin aspart  0-5 Units Subcutaneous QHS  . insulin aspart  0-9 Units Subcutaneous TID WC  . insulin aspart  3 Units Subcutaneous TID WC  . insulin glargine  12 Units Subcutaneous QPM  . melatonin  6 mg Oral QHS  . metoprolol succinate  25 mg Oral Daily  . mometasone-formoterol  2 puff Inhalation BID  . nystatin   Topical BID  . potassium chloride  10 mEq Oral BID  . sacubitril-valsartan  1 tablet Oral BID  . sodium chloride flush  3 mL Intravenous Q12H  . traZODone  150 mg Oral QHS   Continuous Infusions: . sodium chloride       LOS: 12 days   Time spent: 28 minutes  Geradine Girt, DO  How to contact the [facility] Attending or Consulting provider [place] or covering provider during after hours [place], for this patient?  1. Check the care team in [facility] and look for a) attending/consulting TRH provider listed and b) the [facility] team listed 2. Log into [email or web address] and use [place]'s universal password to access. If you do not have the password, please contact the hospital operator. 3. Locate the [facility] provider you are looking for  under [facility] and page to a number that you can be directly reached. 4. If you still have difficulty reaching the provider, please page the [facility] (Director on Call) for the Hospitalists listed on amion for assistance.  11/03/2020, 9:55 AM

## 2020-11-03 NOTE — TOC Progression Note (Signed)
Transition of Care [facility]) - Progression Note   Patient Details  Name: Dustin Cruz MRN: [id number] Date of Birth: June 07, 1966  Transition of Care [facility]) CM/SW [place], LCSW Phone Number: 11/03/2020, 2:36 PM  Clinical Narrative: CSW confirmed with Jackelyn Poling at [facility] patient will have a bed available on 11/05/20. TOC to follow.  Expected Discharge Plan: Skilled Nursing Facility Barriers to Discharge: Continued Medical Work up  Expected Discharge Plan and Services Expected Discharge Plan: [place] In-house Referral: Clinical Social Work Discharge Planning Services: NA Post Acute Care Choice: NA Living arrangements for the past 2 months: Homeless            DME Arranged: N/A DME Agency: NA HH Arranged: NA HH Agency: NA  Readmission Risk Interventions Readmission Risk Prevention Plan 10/25/2020 09/29/2020  Transportation Screening Complete Complete  Medication Review Press photographer) Complete Complete  HRI or Home Care Consult Complete Complete  SW Recovery Care/Counseling Consult Complete Complete  Palliative Care Screening Not Applicable Not Applicable  Skilled Nursing Facility Not Applicable Not Complete  Some recent data might be hidden

## 2020-11-04 DIAGNOSIS — I5033 Acute on chronic diastolic (congestive) heart failure: Secondary | ICD-10-CM | POA: Diagnosis not present

## 2020-11-04 LAB — CBC
HCT: 33.4 % — ABNORMAL LOW (ref 39.0–52.0)
Hemoglobin: 10.4 g/dL — ABNORMAL LOW (ref 13.0–17.0)
MCH: 29.1 pg (ref 26.0–34.0)
MCHC: 31.1 g/dL (ref 30.0–36.0)
MCV: 93.6 fL (ref 80.0–100.0)
Platelets: 455 10*3/uL — ABNORMAL HIGH (ref 150–400)
RBC: 3.57 MIL/uL — ABNORMAL LOW (ref 4.22–5.81)
RDW: 15.5 % (ref 11.5–15.5)
WBC: 7.3 10*3/uL (ref 4.0–10.5)
nRBC: 0 % (ref 0.0–0.2)

## 2020-11-04 LAB — BASIC METABOLIC PANEL
Anion gap: 9 (ref 5–15)
BUN: 31 mg/dL — ABNORMAL HIGH (ref 6–20)
CO2: 34 mmol/L — ABNORMAL HIGH (ref 22–32)
Calcium: 8.8 mg/dL — ABNORMAL LOW (ref 8.9–10.3)
Chloride: 88 mmol/L — ABNORMAL LOW (ref 98–111)
Creatinine, Ser: 1.43 mg/dL — ABNORMAL HIGH (ref 0.61–1.24)
GFR, Estimated: 58 mL/min — ABNORMAL LOW (ref >60)
Glucose, Bld: 101 mg/dL — ABNORMAL HIGH (ref 70–99)
Potassium: 3.4 mmol/L — ABNORMAL LOW (ref 3.5–5.1)
Sodium: 131 mmol/L — ABNORMAL LOW (ref 135–145)

## 2020-11-04 LAB — GLUCOSE, CAPILLARY
Glucose-Capillary: 46 mg/dL — ABNORMAL LOW (ref 70–99)
Glucose-Capillary: 114 mg/dL — ABNORMAL HIGH (ref 70–99)
Glucose-Capillary: 120 mg/dL — ABNORMAL HIGH (ref 70–99)
Glucose-Capillary: 159 mg/dL — ABNORMAL HIGH (ref 70–99)
Glucose-Capillary: 135 mg/dL — ABNORMAL HIGH (ref 70–99)

## 2020-11-04 MED ORDER — TORSEMIDE 20 MG PO TABS
80.0000 mg | ORAL_TABLET | Freq: Two times a day (BID) | ORAL | Status: DC
  Administered 2020-11-04 – 2020-11-06 (×5): 80 mg via ORAL
  Filled 2020-11-04 (×7): qty 4

## 2020-11-04 MED ORDER — POTASSIUM CHLORIDE CRYS ER 20 MEQ PO TBCR
40.0000 meq | EXTENDED_RELEASE_TABLET | Freq: Once | ORAL | Status: AC
  Administered 2020-11-04: 40 meq via ORAL
  Filled 2020-11-04: qty 2

## 2020-11-04 MED ORDER — INSULIN GLARGINE 100 UNIT/ML ~~LOC~~ SOLN
8.0000 [IU] | Freq: Every evening | SUBCUTANEOUS | Status: DC
  Administered 2020-11-04 – 2020-11-06 (×3): 8 [IU] via SUBCUTANEOUS
  Filled 2020-11-04 (×4): qty 0.08

## 2020-11-04 NOTE — Progress Notes (Signed)
Inpatient Diabetes Program Recommendations  AACE/ADA: New Consensus Statement on Inpatient Glycemic Control (2015)  Target Ranges:  Prepandial:   less than 140 mg/dL      Peak postprandial:   less than 180 mg/dL (1-2 hours)      Critically ill patients:  140 - 180 mg/dL   Lab Results  Component Value Date   GLUCAP 120 (H) 11/04/2020   HGBA1C 14.0 (H) 09/27/2020    Review of Glycemic Control Results for ESA, RADEN (MRN [id number]) as of 11/04/2020 10:01  Ref. Range 11/04/2020 03:30 11/04/2020 04:38 11/04/2020 08:24  Glucose-Capillary Latest Ref Range: 70 - 99 mg/dL 46 (L) 114 (H) 120 (H)   Diabetes history:DM2 Outpatient Diabetes medications:Lantus 40 units QPM, Metformin 500 mg BID Current orders for Inpatient glycemic control:Lantus 12 units QPM, Novolog 0-9 units TID with meals, Novolog 0-5 units QHS, Novolog 3 units TID  Inpatient Diabetes Program Recommendations:  Noted hypoglycemia in the AM of 46 mg/dL following insulin adjustment.  Consider further decreasing Lantus to 8 units QPM.   Thanks, Bronson Curb, MSN, RNC-OB Diabetes Coordinator [phone] (8a-5p)

## 2020-11-04 NOTE — Progress Notes (Signed)
PROGRESS NOTE    Dustin Cruz  VHQ:[id number] DOB: 14-Mar-1966 DOA: 10/22/2020 PCP: Dustin Norlander, DO   Brief Narrative:  Per HPI: Dustin Cruz a 55 y.o.male admitted with significant volume overload/anasarca in the setting of acute on chronic diastolic CHF exacerbation with poor compliance on home medications.     Assessment & Plan:   Active Problems:   Acute on chronic diastolic (congestive) heart failure (HCC)   Significant volume overload with anasarca and acute on chronic systolic CHF exacerbation -improving -due to noncompliance with home medications -Last 2D echocardiogram performed on10/16/2021 with LVEF 60-65% and grade 3 diastolic dysfunction, repeated Echo 1/18 with LVEF 35-40% -Lasix IV    -Monitor daily weights and strict I's and O's -Hypoalbuminemia may also be playing a role in overall edema.  received albumin infusion 10/28/2020. -Cardiac diet with fluid restriction -Wound care to lower extremities -PT evaluation with recommendations for skilled nursing facility -cardiology consulted: added entresto 1/21 .  metolazone dosed daily -per prior MD refusing tele  Filed Weights   11/02/20 0700 11/03/20 0513 11/04/20 0446  Weight: 111.7 kg 107.3 kg 105.2 kg    Wide-Complex Tachycardia/Possible Atypical Atrial Flutter - He had up to 25 beats of NSVT on 10/27/2020 and was hypoglycemic at 25 during that time. He has since refused to wear his telemetry monitor. Not felt to be a candidate for an ICD given his medication noncompliance and noncompliance with visits as an outpatient. He has been started on Toprol-XL 25mg  daily.  - Was already on Eliquis 5mg  BID prior to admission given history of a DVT and will continue with anticoagulation at this time in the setting of possible atrial flutter  Mild hypoxemia -Patient became more dyspneic with some mild hypoxemia noted on 1/18, no chest pain -Chest x-ray with cardiomegaly and pulmonary vascular congestion, but no  frank edema -resolved-- off O2  Hyperglycemia in the setting of type 2 diabetes-improved -Noncompliant with home medications -reportedly, he is ordering food/pizza from outside restaurants intermittently.  This makes it very challenging to manage his diabetes. -add meal coverage and decrease lantus for now  Dyslipidemia -Continue Lipitor  CAD with prior stent placement -Continue on aspirin, Eliquis, and Lipitor -Resume home b-blocker  GERD -Famotidine  COPD -No acute bronchospasms currently noted -Continue Dulera -Duo nebs with Xopenex ordered as needed shortness of breath or wheezing  CKD stage IIIa: At baseline -daily labs  DVT prophylaxis:Eliquis Code Status:Full Disposition Plan: Status is: Inpatient  Remains inpatient appropriate because:IV treatments appropriate due to intensity of illness or inability to take PO and Inpatient level of care appropriate due to severity of illness  Dispo: The patient is from:Home Anticipated d/c is to:SNF Anticipated d/c date is:in AM Patient currently is not medically stable to d/c.in AM if [facility] with cards  Consultants:  cards    Subjective: Blood sugar low again this AM  Objective: Vitals:   11/03/20 2257 11/04/20 0446 11/04/20 0937 11/04/20 1116  BP: 106/63 98/82 118/71   Pulse: 92 93 96   Resp: 20 20    Temp: 98.4 F (36.9 C) 98.7 F (37.1 C)    TempSrc: Oral Oral    SpO2: 98% 97%  96%  Weight:  105.2 kg    Height:        Intake/Output Summary (Last 24 hours) at 11/04/2020 1218 Last data filed at 11/04/2020 0500 Gross per 24 hour  Intake 480 ml  Output 1400 ml  Net -920 ml   [employer]  11/02/20 0700 11/03/20 0513 11/04/20 0446  Weight: 111.7 kg 107.3 kg 105.2 kg    Examination:    General: Appearance:     Overweight male in no acute distress     Lungs:     Not on O2, respirations unlabored  Heart:    Normal heart rate. Normal rhythm. No  murmurs, rubs, or gallops.   MS:   All extremities are intact. +edema  Neurologic:   Awake, alert     Data Reviewed: I have personally reviewed following labs and imaging studies  CBC: Recent Labs  Lab 11/04/20 0445  WBC 7.3  HGB 10.4*  HCT 33.4*  MCV 93.6  PLT [id number]*   Basic Metabolic Panel: Recent Labs  Lab 10/31/20 0500 11/01/20 0631 11/02/20 0933 11/03/20 0411 11/04/20 0445  NA 136 134* 132* 134* 131*  K 3.9 3.6 3.5 3.8 3.4*  CL 94* 91* 90* 91* 88*  CO2 31 33* 34* 31 34*  GLUCOSE 134* 149* 196* 86 101*  BUN 40* 37* 31* 31* 31*  CREATININE 1.48* 1.38* 1.28* 1.24 1.43*  CALCIUM 8.7* 8.5* 8.6* 8.6* 8.8*  MG 1.8  --  1.7  --   --    GFR: Estimated Creatinine Clearance: 75.5 mL/min (A) (by C-G formula based on SCr of 1.43 mg/dL (H)). Liver Function Tests: Recent Labs  Lab 10/29/20 0457  AST 50*  ALT 47*  ALKPHOS 118  BILITOT 0.6  PROT 6.4*  ALBUMIN 2.3*   No results for input(s): LIPASE, AMYLASE in the last 168 hours. No results for input(s): AMMONIA in the last 168 hours. Coagulation Profile: No results for input(s): INR, PROTIME in the last 168 hours. Cardiac Enzymes: No results for input(s): CKTOTAL, CKMB, CKMBINDEX, TROPONINI in the last 168 hours. BNP (last 3 results) No results for input(s): PROBNP in the last 8760 hours. HbA1C: No results for input(s): HGBA1C in the last 72 hours. CBG: Recent Labs  Lab 11/03/20 2145 11/04/20 0330 11/04/20 0438 11/04/20 0824 11/04/20 1210  GLUCAP 133* 46* 114* 120* 159*   Lipid Profile: No results for input(s): CHOL, HDL, LDLCALC, TRIG, CHOLHDL, LDLDIRECT in the last 72 hours. Thyroid Function Tests: No results for input(s): TSH, T4TOTAL, FREET4, T3FREE, THYROIDAB in the last 72 hours. Anemia Panel: No results for input(s): VITAMINB12, FOLATE, FERRITIN, TIBC, IRON, RETICCTPCT in the last 72 hours. Sepsis Labs: No results for input(s): PROCALCITON, LATICACIDVEN in the last 168 hours.  No results found for  this or any previous visit (from the past 240 hour(s)).  Radiology Studies: No results found. Scheduled Meds: . apixaban  5 mg Oral BID  . atorvastatin  80 mg Oral Daily  . busPIRone  7.5 mg Oral BID  . famotidine  20 mg Oral BID  . gabapentin  300 mg Oral TID  . hydrocerin   Topical BID  . insulin aspart  0-5 Units Subcutaneous QHS  . insulin aspart  0-9 Units Subcutaneous TID WC  . insulin aspart  3 Units Subcutaneous TID WC  . insulin glargine  8 Units Subcutaneous QPM  . melatonin  6 mg Oral QHS  . metoprolol succinate  25 mg Oral Daily  . mometasone-formoterol  2 puff Inhalation BID  . nystatin   Topical BID  . potassium chloride  10 mEq Oral BID  . sacubitril-valsartan  1 tablet Oral BID  . sodium chloride flush  3 mL Intravenous Q12H  . torsemide  80 mg Oral BID  . traZODone  150 mg Oral QHS  Continuous Infusions: . sodium chloride       LOS: 13 days   Time spent: 28 minutes  Geradine Girt, DO  How to contact the [facility] Attending or Consulting provider [place] or covering provider during after hours [place], for this patient?  1. Check the care team in [facility] and look for a) attending/consulting TRH provider listed and b) the [facility] team listed 2. Log into [email or web address] and use [place]'s universal password to access. If you do not have the password, please contact the hospital operator. 3. Locate the [facility] provider you are looking for under [facility] and page to a number that you can be directly reached. 4. If you still have difficulty reaching the provider, please page the [facility] (Director on Call) for the Hospitalists listed on amion for assistance.  11/04/2020, 12:18 PM

## 2020-11-04 NOTE — Progress Notes (Addendum)
Progress Note  Patient Name: Dustin Cruz Date of Encounter: 11/04/2020  Primary Cardiologist: Rozann Lesches, MD   Subjective   Had a fall last night and landed on his hip. Says he is sore but has been able to walk back and forth to the restroom this morning. Breathing back to baseline. No chest pain or palpitations.   Inpatient Medications    Scheduled Meds: . apixaban  5 mg Oral BID  . atorvastatin  80 mg Oral Daily  . busPIRone  7.5 mg Oral BID  . famotidine  20 mg Oral BID  . furosemide  80 mg Intravenous Q8H  . gabapentin  300 mg Oral TID  . hydrocerin   Topical BID  . insulin aspart  0-5 Units Subcutaneous QHS  . insulin aspart  0-9 Units Subcutaneous TID WC  . insulin aspart  3 Units Subcutaneous TID WC  . insulin glargine  12 Units Subcutaneous QPM  . melatonin  6 mg Oral QHS  . metoprolol succinate  25 mg Oral Daily  . mometasone-formoterol  2 puff Inhalation BID  . nystatin   Topical BID  . potassium chloride  10 mEq Oral BID  . potassium chloride  40 mEq Oral Once  . sacubitril-valsartan  1 tablet Oral BID  . sodium chloride flush  3 mL Intravenous Q12H  . traZODone  150 mg Oral QHS   Continuous Infusions: . sodium chloride     PRN Meds: sodium chloride, acetaminophen **OR** acetaminophen, ipratropium-albuterol, levalbuterol, metoprolol tartrate, ondansetron **OR** ondansetron (ZOFRAN) IV, oxyCODONE, sodium chloride flush   Vital Signs    Vitals:   11/03/20 2117 11/03/20 2144 11/03/20 2257 11/04/20 0446  BP:  125/88 106/63 98/82  Pulse:  93 92 93  Resp:  20 20 20   Temp:  98.5 F (36.9 C) 98.4 F (36.9 C) 98.7 F (37.1 C)  TempSrc:  Oral Oral Oral  SpO2: 94% 98% 98% 97%  Weight:    105.2 kg  Height:        Intake/Output Summary (Last 24 hours) at 11/04/2020 0856 Last data filed at 11/04/2020 0500 Gross per 24 hour  Intake 720 ml  Output 1850 ml  Net -1130 ml    Last 3 Weights 11/04/2020 11/03/2020 11/02/2020  Weight (lbs) 231 lb 14.8 oz 236  lb 8.9 oz 246 lb 4.1 oz  Weight (kg) 105.2 kg 107.3 kg 111.7 kg  Some encounter information is confidential and restricted. Go to Review Flowsheets activity to see all data.      Telemetry    Patient has refused telemetry.   ECG    No new tracings.   Physical Exam   General: Well developed, well nourished, male appearing in no acute distress. Head: Normocephalic, atraumatic.  Neck: Supple without bruits, JVD not elevated. Lungs:  Resp regular and unlabored, occasional rales. Heart: RRR, S1, S2, no S3, S4, or murmur; no rub. Abdomen: Soft, non-tender, non-distended with normoactive bowel sounds. No hepatomegaly. No rebound/guarding. No obvious abdominal masses. Extremities: No clubbing or cyanosis, 1+ pitting edema. Dressings in place along LLE.  Neuro: Alert and oriented X 3. Moves all extremities spontaneously. Psych: Normal affect.  Labs    Chemistry Recent Labs  Lab 10/29/20 0457 10/30/20 2951 11/02/20 0933 11/03/20 0411 11/04/20 0445  NA 132*   < > 132* 134* 131*  K 4.2   < > 3.5 3.8 3.4*  CL 92*   < > 90* 91* 88*  CO2 30   < > 34* 31  34*  GLUCOSE 98   < > 196* 86 101*  BUN 39*   < > 31* 31* 31*  CREATININE 1.47*   < > 1.28* 1.24 1.43*  CALCIUM 8.4*   < > 8.6* 8.6* 8.8*  PROT 6.4*  --   --   --   --   ALBUMIN 2.3*  --   --   --   --   AST 50*  --   --   --   --   ALT 47*  --   --   --   --   ALKPHOS 118  --   --   --   --   BILITOT 0.6  --   --   --   --   GFRNONAA 56*   < > >60 >60 58*  ANIONGAP 10   < > 8 12 9    < > = values in this interval not displayed.     Hematology Recent Labs  Lab 11/04/20 0445  WBC 7.3  RBC 3.57*  HGB 10.4*  HCT 33.4*  MCV 93.6  MCH 29.1  MCHC 31.1  RDW 15.5  PLT 455*    Cardiac EnzymesNo results for input(s): TROPONINI in the last 168 hours. No results for input(s): TROPIPOC in the last 168 hours.   BNPNo results for input(s): BNP, PROBNP in the last 168 hours.   DDimer No results for input(s): DDIMER in the last  168 hours.   Radiology    No results found.  Cardiac Studies   Echocardiogram: 10/2020 IMPRESSIONS    1. Left ventricular ejection fraction, by estimation, is 35 to 40%. The  left ventricle has moderately decreased function. The left ventricle  demonstrates global hypokinesis. There is mild left ventricular  hypertrophy. Left ventricular diastolic  parameters are indeterminate.  2. Right ventricular systolic function is mildly reduced. The right  ventricular size is moderately enlarged. There is moderately elevated  pulmonary artery systolic pressure.  3. The mitral valve is normal in structure. Mild mitral valve  regurgitation. No evidence of mitral stenosis.  4. The aortic valve is tricuspid. There is mild calcification of the  aortic valve. There is mild thickening of the aortic valve. Aortic valve  regurgitation is not visualized. No aortic stenosis is present.  5. Moderate pulmonary HTN, PASP is 54 mmHg.  6. The inferior vena cava is dilated in size with <50% respiratory  variability, suggesting right atrial pressure of 15 mmHg.   Patient Profile     55 y.o. male w/ PMHof CAD(s/p BMS to [place] in 2012, 70-80% ISR by repeat cath in 2013 with medical management recommended due to small vessel size),chronic diastolic CHF,HTN, HLD, Type 2 DMand history of DVT (occurring in 03/2019) and noncompliancewho is currently admitted with a CHF exacerbation and repeat echo shows a reduced EF of 35-40%.  Assessment & Plan    1. Acute on Chronic Combined Systolic and Diastolic CHF - Repeat echo this admission shows a reduced EF of 35-40% with global HK and mildly reduced RV function. Has been receiving IV Lasix 80mg  TID along with doses of Metolazone 2.5mg  for the past 3 days. I&O's not accurate as he has declined to have this measured. Weight has declined by 26 lbs thus far and listed as having declined by 5 lbs yesterday. Given his bump in creatinine, would hold additional  Metolazone today. By review of notes, he is planning to discharge to SNF in the next 24-48 hours and this seems reasonable  as he will likely continue to diurese well as an outpatient given he was noncompliant with his medications prior to admission but will now be going to SNF. Will review dosing with Dr. Harl Bowie but would anticipate starting Torsemide given his current high-dose IV Lasix.  - Continue Toprol-XL 25mg  daily and Entresto 24-26mg  BID. Unable to titrate given soft BP.   2. CAD - He is s/p BMS to [place] in 2012with70-80% ISR by repeat cath in 2013 with medical management recommended due to small vessel size. HS Troponin values were flat on admission.  - Continue Atorvastatin 80mg  daily and Toprol-XL 25mg  daily. ASA has been discontinued given the use of anticoagulation.   3. Wide-Complex Tachycardia/Possible Atypical Atrial Flutter - When he was in agreement to wearing telemetry, he was noted to have 25 beats of NSVT on 10/27/2020. He has since refused telemetry. Not felt to be a candidate for an ICD given his medication noncompliance and noncompliance with visits. Continue with BB therapy.  - Unable to assess for recurrent atrial flutter given he refuses telemetry, but he has been continued on Eliquis 5mg  BID given his prior DVT.   4. HTN - BP has variable from 98/82 - 125/88 within the past 24 hours. Continue current medication regimen.   5. History of DVT - Continue Eliquis 5mg  BID for anticoagulation.   6. Stage 3 CKD - Baseline creatinine 1.2 - 1.3. Peaked at 1.60 this admission. Was improved to 1.24 on 1/26 and trending up to 1.43 today.    For questions or updates, please contact [place] Please consult [email or web address] for contact info under Cardiology/STEMI.   Arna Medici , PA-C 8:56 AM 11/04/2020 Pager: [phone]  Attending note Patient seen and discussed with PA Ahmed Prima, I agree with her documentation. Admitted with acute on chronic combined  systolic/diastolic HF. Has not been cooperating with I/Os, he is on IV lasix 80mg  tid and has received doses of oral metolazone. Uptrend in Cr, no additional metolazone. Ongoing significant edema that will take extended period to resolve, no cardiopulmonary symptoms so transitioning to oral regimen with plans for ongoing diuresis at SNF would be reasonble. D/c IV lasix, start oral torsemide 80mg  bid. Would need bmet/mg in 3 days if discharged to SNF.    Carlyle Dolly MD

## 2020-11-04 NOTE — TOC Progression Note (Signed)
Transition of Care [facility]) - Progression Note    Patient Details  Name: Dustin Cruz MRN: [id number] Date of Birth: 08/06/1966  [facility] CM/SW Contact  Salome Arnt, [place] Phone Number: 11/04/2020, 10:41 AM  Clinical Narrative:  LCSW initiated Nav[employer] authorization (ref #: [id number]). Debbie at [facility] aware of anticipate d/c tomorrow. COVID test has been ordered.      Expected Discharge Plan: [place] Barriers to Discharge: Continued Medical Work up  Expected Discharge Plan and Services Expected Discharge Plan: [place] In-house Referral: Clinical Social Work Discharge Planning Services: NA Post Acute Care Choice: NA Living arrangements for the past 2 months: Homeless                 DME Arranged: N/A DME Agency: NA       HH Arranged: NA HH Agency: NA         Social Determinants of Health (SDOH) Interventions    Readmission Risk Interventions Readmission Risk Prevention Plan 10/25/2020 09/29/2020  Transportation Screening Complete Complete  Medication Review Press photographer) Complete Complete  HRI or Home Care Consult Complete Complete  SW Recovery Care/Counseling Consult Complete Complete  Palliative Care Screening Not Applicable Not Applicable  Skilled Nursing Facility Not Applicable Not Complete  Some recent data might be hidden

## 2020-11-04 NOTE — Progress Notes (Signed)
Physical Therapy Treatment Patient Details Name: Dustin Cruz MRN: [id number] DOB: 1966/08/30 Today's Date: 11/04/2020    History of Present Illness Dustin Cruz is a 55 y.o. male with medical history significant for CAD, type 2 diabetes, diastolic heart failure, GERD, and COPD who presented to the ED with worsening lower extremity edema as well as abdominal edema and scrotal edema as well as some weakness.  He is currently homeless and lives in his car.  He states that he had run out of his Lasix as well as his diabetes medication for the last few days.  It appears that on last admission on 09/2020 that he left AMA as he was being treated for edema at that time.  He denies any fever, chills, abdominal pain, nausea, vomiting, chest pain, or other concerns.    PT Comments    Pt limited by pain this session following fall this morning.  Pt friendly and willing to participate with therapy today.  Pt presents slow labored movements with bed mobility and gait training.  Cueing with transfer training to push from bed vs full from RW to assist with standing.  Decreased distance with gait this session and use of RW for safety vs QC, slow labored cadence and wide turns, no LOB during gait.  Later today pt requested assistance to toilet, able to make bowel movement.  Catheter fell off while in restroom, RW aware.  EOS pt left in bed with call bell within reach and bed alarm set.  RN aware of status.       Follow Up Recommendations  SNF;Supervision for mobility/OOB;Supervision - Intermittent     Equipment Recommendations  Other (comment) (Rollator walker)    Recommendations for Other Services       Precautions / Restrictions Precautions Precautions: Fall Restrictions Weight Bearing Restrictions: No    Mobility  Bed Mobility   Bed Mobility: Supine to Sit     Supine to sit: Min guard     General bed mobility comments: increased time, labored movement  Transfers Overall transfer level:  Needs assistance Equipment used: Quad cane;Rolling walker (2 wheeled) Transfers: Sit to/from Stand Sit to Stand: Min assist         General transfer comment: increased time, labored movement to complete sit to stands, cueing for handplacement with STS  Ambulation/Gait Ambulation/Gait assistance: Min guard Gait Distance (Feet): 45 Feet Assistive device: Rolling walker (2 wheeled) Gait Pattern/deviations: Step-to pattern;Decreased step length - right;Decreased step length - left;Decreased stride length Gait velocity: decreased   General Gait Details: slow labored cadence with cueing for posture with RW, reduced distance this session due to pain Rt LE following fall this morning.   Stairs             Wheelchair Mobility    Modified Rankin (Stroke Patients Only)       Balance                                            Cognition Arousal/Alertness: Awake/alert Behavior During Therapy: WFL for tasks assessed/performed Overall Cognitive Status: Within Functional Limits for tasks assessed                                        Exercises      General Comments  Pertinent Vitals/Pain Pain Assessment: 0-10 Pain Score: 9  Pain Location: Rt LE Pain Descriptors / Indicators: Sore Pain Intervention(s): Monitored during session;Limited activity within patient's tolerance;Repositioned    Home Living                      Prior Function            PT Goals (current goals can now be found in the care plan section)      Frequency    Min 3X/week      PT Plan Current plan remains appropriate    Co-evaluation              AM-PAC PT "6 Clicks" Mobility   Outcome Measure  Help needed turning from your back to your side while in a flat bed without using bedrails?: None Help needed moving from lying on your back to sitting on the side of a flat bed without using bedrails?: A Little Help needed moving to and  from a bed to a chair (including a wheelchair)?: A Little Help needed standing up from a chair using your arms (e.g., wheelchair or bedside chair)?: A Little Help needed to walk in hospital room?: A Little Help needed climbing 3-5 steps with a railing? : A Lot 6 Click Score: 18    End of Session Equipment Utilized During Treatment: Gait belt Activity Tolerance: Patient tolerated treatment well;Patient limited by pain;No increased pain;Patient limited by fatigue Patient left: in bed;with call bell/phone within reach;with bed alarm set;with nursing/sitter in room Nurse Communication: Mobility status PT Visit Diagnosis: Unsteadiness on feet (R26.81);Other abnormalities of gait and mobility (R26.89);Muscle weakness (generalized) (M62.81)     Time: 1025-8527 (also seen from 1525-1550 gait to restroom for bowel movement) PT Time Calculation (min) (ACUTE ONLY): 25 min  Charges:  $Therapeutic Activity: 23-37 mins                     Ihor Austin, LPTA/CLT; CBIS [phone]    Aldona Lento 11/04/2020, 4:16 PM

## 2020-11-05 ENCOUNTER — Other Ambulatory Visit: Payer: Self-pay | Admitting: Family Medicine

## 2020-11-05 ENCOUNTER — Inpatient Hospital Stay (HOSPITAL_COMMUNITY): Payer: 59

## 2020-11-05 DIAGNOSIS — E1122 Type 2 diabetes mellitus with diabetic chronic kidney disease: Secondary | ICD-10-CM

## 2020-11-05 DIAGNOSIS — U071 COVID-19: Secondary | ICD-10-CM

## 2020-11-05 DIAGNOSIS — I5033 Acute on chronic diastolic (congestive) heart failure: Secondary | ICD-10-CM | POA: Diagnosis not present

## 2020-11-05 DIAGNOSIS — R059 Cough, unspecified: Secondary | ICD-10-CM

## 2020-11-05 DIAGNOSIS — N183 Chronic kidney disease, stage 3 unspecified: Secondary | ICD-10-CM | POA: Diagnosis not present

## 2020-11-05 LAB — BASIC METABOLIC PANEL
Anion gap: 9 (ref 5–15)
BUN: 31 mg/dL — ABNORMAL HIGH (ref 6–20)
CO2: 35 mmol/L — ABNORMAL HIGH (ref 22–32)
Calcium: 8.9 mg/dL (ref 8.9–10.3)
Chloride: 88 mmol/L — ABNORMAL LOW (ref 98–111)
Creatinine, Ser: 1.43 mg/dL — ABNORMAL HIGH (ref 0.61–1.24)
GFR, Estimated: 58 mL/min — ABNORMAL LOW (ref >60)
Glucose, Bld: 125 mg/dL — ABNORMAL HIGH (ref 70–99)
Potassium: 3.7 mmol/L (ref 3.5–5.1)
Sodium: 132 mmol/L — ABNORMAL LOW (ref 135–145)

## 2020-11-05 LAB — GLUCOSE, CAPILLARY
Glucose-Capillary: 180 mg/dL — ABNORMAL HIGH (ref 70–99)
Glucose-Capillary: 256 mg/dL — ABNORMAL HIGH (ref 70–99)

## 2020-11-05 LAB — SARS CORONAVIRUS 2 (TAT 6-24 HRS): SARS Coronavirus 2: POSITIVE — AB

## 2020-11-05 NOTE — Progress Notes (Signed)
PROGRESS NOTE    Dustin Cruz  ZOX:[id number] DOB: 1966-07-22 DOA: 10/22/2020 PCP: [name], DO   Brief Narrative:  Per HPI: Dustin Cruz a 55 y.o.male admitted with significant volume overload/anasarca in the setting of acute on chronic diastolic CHF exacerbation with poor compliance on home medications.  Was attempting to discharge to SNF when COVID swab was incidentally positive.   Assessment & Plan:   Active Problems:   CKD stage 3 due to type 2 diabetes mellitus (HCC)   Essential hypertension, benign   Acute on chronic diastolic (congestive) heart failure (HCC)   COVID   Significant volume overload with anasarca and acute on chronic systolic CHF exacerbation -improving -due to noncompliance with home medications -Last 2D echocardiogram performed on10/16/2021 with LVEF 60-65% and grade 3 diastolic dysfunction, repeated Echo 1/18 with LVEF 35-40% -diuretics changed to PO torsemide on 1/28: If renal function worsens, may need to reduce dosing to 60mg  BID. -Monitor daily weights and strict I's and O's -Hypoalbuminemia may also be playing a role in overall edema.  received albumin infusion 10/28/2020. -cardiology consulted: added entresto 1/21 . -per prior MD refusing tele  Filed Weights   11/03/20 0513 11/04/20 0446 11/05/20 0600  Weight: 107.3 kg 105.2 kg 104.7 kg    Wide-Complex Tachycardia/Possible Atypical Atrial Flutter - He had up to 25 beats of NSVT on 10/27/2020 and was hypoglycemic at 25 during that time. He has since refused to wear his telemetry monitor. Not felt to be a candidate for an ICD given his medication noncompliance and noncompliance with visits as an outpatient. He has been started on Toprol-XL 25mg  daily.  - Was already on Eliquis 5mg  BID prior to admission given history of a DVT and will continue with anticoagulation at this time in the setting of possible atrial flutter  Mild hypoxemia -Patient became more dyspneic with some mild  hypoxemia noted on 1/18, no chest pain -Chest x-ray with cardiomegaly and pulmonary vascular congestion, but no frank edema -resolved-- off O2  Hyperglycemia in the setting of type 2 diabetes-improved -Noncompliant with home medications -reportedly, he is ordering food/pizza from outside restaurants intermittently.  This makes it very challenging to manage his diabetes. -add meal coverage and decrease lantus for now  Dyslipidemia -Continue Lipitor  CAD with prior stent placement -Continue on aspirin, Eliquis, and Lipitor -Resume home b-blocker  GERD -Famotidine  COPD -No acute bronchospasms currently noted -Continue Dulera -Duo nebs with Xopenex ordered as needed shortness of breath or wheezing  CKD stage IIIa: At baseline -daily labs  incidental COVID 19 infection-- no symptoms  DVT prophylaxis:Eliquis Code Status:Full Disposition Plan: Status is: Inpatient  Remains inpatient appropriate because:IV treatments appropriate due to intensity of illness or inability to take PO and Inpatient level of care appropriate due to severity of illness  Dispo: The patient is from:Home Anticipated d/c is to:SNF when COVID bed available or after 10 days    Consultants:  cards    Subjective: Tested COVID + yesterday  Objective: Vitals:   11/04/20 2020 11/04/20 2050 11/05/20 0500 11/05/20 0600  BP:  118/73 135/80   Pulse:  91 89   Resp:  20 17   Temp:  99.4 F (37.4 C) 98.3 F (36.8 C)   TempSrc:  Oral Oral   SpO2: 95% 99%    Weight:    104.7 kg  Height:        Intake/Output Summary (Last 24 hours) at 11/05/2020 1043 Last data filed at 11/05/2020 0529 Gross per  24 hour  Intake 480 ml  Output 2203 ml  Net -1723 ml   Filed Weights   11/03/20 0513 11/04/20 0446 11/05/20 0600  Weight: 107.3 kg 105.2 kg 104.7 kg    Examination:  General: Appearance:     Overweight male in no acute distress     Lungs:      respirations unlabored  Heart:    Normal heart rate. Normal rhythm. No murmurs, rubs, or gallops.   MS:   All extremities are intact.   Neurologic:   Awake, alert     Data Reviewed: I have personally reviewed following labs and imaging studies  CBC: Recent Labs  Lab 11/04/20 0445  WBC 7.3  HGB 10.4*  HCT 33.4*  MCV 93.6  PLT 355*   Basic Metabolic Panel: Recent Labs  Lab 10/31/20 0500 11/01/20 0631 11/02/20 0933 11/03/20 0411 11/04/20 0445 11/05/20 0422  NA 136 134* 132* 134* 131* 132*  K 3.9 3.6 3.5 3.8 3.4* 3.7  CL 94* 91* 90* 91* 88* 88*  CO2 31 33* 34* 31 34* 35*  GLUCOSE 134* 149* 196* 86 101* 125*  BUN 40* 37* 31* 31* 31* 31*  CREATININE 1.48* 1.38* 1.28* 1.24 1.43* 1.43*  CALCIUM 8.7* 8.5* 8.6* 8.6* 8.8* 8.9  MG 1.8  --  1.7  --   --   --    GFR: Estimated Creatinine Clearance: 75.3 mL/min (A) (by C-G formula based on SCr of 1.43 mg/dL (H)). Liver Function Tests: No results for input(s): AST, ALT, ALKPHOS, BILITOT, PROT, ALBUMIN in the last 168 hours. No results for input(s): LIPASE, AMYLASE in the last 168 hours. No results for input(s): AMMONIA in the last 168 hours. Coagulation Profile: No results for input(s): INR, PROTIME in the last 168 hours. Cardiac Enzymes: No results for input(s): CKTOTAL, CKMB, CKMBINDEX, TROPONINI in the last 168 hours. BNP (last 3 results) No results for input(s): PROBNP in the last 8760 hours. HbA1C: No results for input(s): HGBA1C in the last 72 hours. CBG: Recent Labs  Lab 11/04/20 0330 11/04/20 0438 11/04/20 0824 11/04/20 1210 11/04/20 1603  GLUCAP 46* 114* 120* 159* 135*   Lipid Profile: No results for input(s): CHOL, HDL, LDLCALC, TRIG, CHOLHDL, LDLDIRECT in the last 72 hours. Thyroid Function Tests: No results for input(s): TSH, T4TOTAL, FREET4, T3FREE, THYROIDAB in the last 72 hours. Anemia Panel: No results for input(s): VITAMINB12, FOLATE, FERRITIN, TIBC, IRON, RETICCTPCT in the last 72 hours. Sepsis  Labs: No results for input(s): PROCALCITON, LATICACIDVEN in the last 168 hours.  Recent Results (from the past 240 hour(s))  SARS CORONAVIRUS 2 (TAT 6-24 HRS) Nasopharyngeal Nasopharyngeal Swab     Status: Abnormal   Collection Time: 11/04/20 10:07 AM   Specimen: Nasopharyngeal Swab  Result Value Ref Range Status   SARS Coronavirus 2 POSITIVE (A) NEGATIVE Final    Comment: (NOTE) SARS-CoV-2 target nucleic acids are DETECTED.  The SARS-CoV-2 RNA is generally detectable in upper and lower respiratory specimens during the acute phase of infection. Positive results are indicative of the presence of SARS-CoV-2 RNA. Clinical correlation with patient history and other diagnostic information is  necessary to determine patient infection status. Positive results do not rule out bacterial infection or co-infection with other viruses.  The expected result is Negative.  Fact Sheet for Patients: SugarRoll.be  Fact Sheet for Healthcare Providers: https://www.woods-mathews.com/  This test is not yet approved or cleared by the [place] FDA and  has been authorized for detection and/or diagnosis of SARS-CoV-2 by FDA  under an Emergency Use Authorization (EUA). This EUA will remain  in effect (meaning this test can be used) for the duration of the COVID-19 declaration under Section 564(b)(1) of the Act, 21 U. S.C. section 360bbb-3(b)(1), unless the authorization is terminated or revoked sooner.   Performed at [facility], [place] [address], [place], [place] [address]     Radiology Studies: No results found. Scheduled Meds: . apixaban  5 mg Oral BID  . atorvastatin  80 mg Oral Daily  . busPIRone  7.5 mg Oral BID  . famotidine  20 mg Oral BID  . gabapentin  300 mg Oral TID  . hydrocerin   Topical BID  . insulin aspart  0-5 Units Subcutaneous QHS  . insulin aspart  0-9 Units Subcutaneous TID WC  . insulin aspart  3 Units Subcutaneous TID WC   . insulin glargine  8 Units Subcutaneous QPM  . melatonin  6 mg Oral QHS  . metoprolol succinate  25 mg Oral Daily  . mometasone-formoterol  2 puff Inhalation BID  . nystatin   Topical BID  . potassium chloride  10 mEq Oral BID  . sacubitril-valsartan  1 tablet Oral BID  . sodium chloride flush  3 mL Intravenous Q12H  . torsemide  80 mg Oral BID  . traZODone  150 mg Oral QHS   Continuous Infusions: . sodium chloride       LOS: 14 days   Time spent: 28 minutes  Geradine Girt, DO  How to contact the [facility] Attending or Consulting provider [place] or covering provider during after hours [place], for this patient?  1. Check the care team in [facility] and look for a) attending/consulting [facility] provider listed and b) the [facility] team listed 2. Log into [email or web address] and use [place]'s universal password to access. If you do not have the password, please contact the hospital operator. 3. Locate the [facility] provider you are looking for under [facility] and page to a number that you can be directly reached. 4. If you still have difficulty reaching the provider, please page the [facility] (Director on Call) for the Hospitalists listed on amion for assistance.  11/05/2020, 10:43 AM

## 2020-11-05 NOTE — Progress Notes (Signed)
When I was checking patient's chart I noticed that an urgent lab had posted. I saw that patient had test positive for Covid. MD notified.

## 2020-11-05 NOTE — Progress Notes (Addendum)
    The patient was transitioned from IV Lasix 80mg  TID to Torsemide 80mg  BID yesterday (has received 1 dose thus far) and creatinine remains stable at 1.43 today. He has been intermittently compliant with I&O's but had a documented output of -1.4L yesterday and weight declined by an additional pound. Was initially suppose to be discharged to SNF today but by review of notes he tested positive for COVID-19 on repeat testing. Would continue with PO Torsemide for now and monitor response. If renal function worsens, may need to reduce dosing to 60mg  BID. Will arrange for office follow-up in 2-3 weeks.   Signed, Erma Heritage, PA-C 11/05/2020, 8:02 AM Pager: [phone]   Attending note Patient data reviewed peripherally, ok for discharge on oral regimen as stated above.    Carlyle Dolly MD

## 2020-11-05 NOTE — Progress Notes (Signed)
Pt complained left  arm was hurting as a result from a previous fall, I messaged MD and a xray was ordered.

## 2020-11-05 NOTE — TOC Progression Note (Signed)
Transition of Care [facility]) - Progression Note   Patient Details  Name: Dustin Cruz MRN: [id number] Date of Birth: May 03, 1966  Transition of Care [facility]) CM/SW [employer], LCSW Phone Number: 11/05/2020, 1:05 PM  Clinical Narrative: Patient has now tested positive for COVID and CSW confirmed with Jackelyn Poling at [facility] that patient can now no longer come. CSW updated patient. CSW faxed patient out to COVID-accepting facilities ([facility], [facility], [facility], [place], [place]). CSW called Bernadene Bell to cancel authorization for [facility]. TOC awaiting COVID bed offer.  Expected Discharge Plan: Skilled Nursing Facility Barriers to Discharge: Continued Medical Work up  Expected Discharge Plan and Services Expected Discharge Plan: [place] In-house Referral: Clinical Social Work Discharge Planning Services: NA Post Acute Care Choice: NA Living arrangements for the past 2 months: Homeless             DME Arranged: N/A DME Agency: NA HH Arranged: NA HH Agency: NA  Readmission Risk Interventions Readmission Risk Prevention Plan 10/25/2020 09/29/2020  Transportation Screening Complete Complete  Medication Review Press photographer) Complete Complete  HRI or Home Care Consult Complete Complete  SW Recovery Care/Counseling Consult Complete Complete  Palliative Care Screening Not Applicable Not Applicable  Skilled Nursing Facility Not Applicable Not Complete  Some recent data might be hidden

## 2020-11-06 DIAGNOSIS — I5033 Acute on chronic diastolic (congestive) heart failure: Secondary | ICD-10-CM | POA: Diagnosis not present

## 2020-11-06 DIAGNOSIS — U071 COVID-19: Secondary | ICD-10-CM | POA: Diagnosis not present

## 2020-11-06 DIAGNOSIS — N183 Chronic kidney disease, stage 3 unspecified: Secondary | ICD-10-CM | POA: Diagnosis not present

## 2020-11-06 DIAGNOSIS — E1122 Type 2 diabetes mellitus with diabetic chronic kidney disease: Secondary | ICD-10-CM | POA: Diagnosis not present

## 2020-11-06 LAB — BASIC METABOLIC PANEL
Anion gap: 10 (ref 5–15)
BUN: 33 mg/dL — ABNORMAL HIGH (ref 6–20)
CO2: 34 mmol/L — ABNORMAL HIGH (ref 22–32)
Calcium: 8.4 mg/dL — ABNORMAL LOW (ref 8.9–10.3)
Chloride: 89 mmol/L — ABNORMAL LOW (ref 98–111)
Creatinine, Ser: 1.38 mg/dL — ABNORMAL HIGH (ref 0.61–1.24)
GFR, Estimated: >60 mL/min (ref >60)
Glucose, Bld: 135 mg/dL — ABNORMAL HIGH (ref 70–99)
Potassium: 3.2 mmol/L — ABNORMAL LOW (ref 3.5–5.1)
Sodium: 133 mmol/L — ABNORMAL LOW (ref 135–145)

## 2020-11-06 LAB — GLUCOSE, CAPILLARY
Glucose-Capillary: 142 mg/dL — ABNORMAL HIGH (ref 70–99)
Glucose-Capillary: 122 mg/dL — ABNORMAL HIGH (ref 70–99)
Glucose-Capillary: 271 mg/dL — ABNORMAL HIGH (ref 70–99)
Glucose-Capillary: 233 mg/dL — ABNORMAL HIGH (ref 70–99)

## 2020-11-06 MED ORDER — POTASSIUM CHLORIDE CRYS ER 20 MEQ PO TBCR
40.0000 meq | EXTENDED_RELEASE_TABLET | Freq: Once | ORAL | Status: AC
  Administered 2020-11-06: 40 meq via ORAL
  Filled 2020-11-06: qty 2

## 2020-11-06 MED ORDER — POTASSIUM CHLORIDE CRYS ER 20 MEQ PO TBCR
20.0000 meq | EXTENDED_RELEASE_TABLET | Freq: Two times a day (BID) | ORAL | Status: DC
  Administered 2020-11-06 – 2020-11-18 (×24): 20 meq via ORAL
  Filled 2020-11-06 (×3): qty 1
  Filled 2020-11-06: qty 2
  Filled 2020-11-06: qty 1
  Filled 2020-11-06: qty 2
  Filled 2020-11-06 (×2): qty 1
  Filled 2020-11-06: qty 2
  Filled 2020-11-06: qty 1
  Filled 2020-11-06: qty 2
  Filled 2020-11-06 (×6): qty 1
  Filled 2020-11-06: qty 2
  Filled 2020-11-06: qty 1
  Filled 2020-11-06: qty 2
  Filled 2020-11-06 (×4): qty 1

## 2020-11-06 NOTE — Progress Notes (Signed)
EMS arrived to transport home. IV removed earlier in there afternoon. Pt belongings sent home. Emergency contact notified about arrival. No distress noted.

## 2020-11-06 NOTE — Progress Notes (Signed)
Patient ID: Dustin Cruz, male   DOB: April 05, 1966, 55 y.o.   MRN: [id number]   Elbow   Opinion:  Unclear if ffractured   Fortunately even if it were a hairline fracture its now > 7 days and the treatment is active range of motion without immobilization

## 2020-11-06 NOTE — Progress Notes (Signed)
PROGRESS NOTE    Dustin Cruz  [id number] DOB: [birth year] DOA: 10/22/2020 PCP: [name], DO   Brief Narrative:  Per HPI: Dustin Cruz a 55 y.o.male admitted with significant volume overload/anasarca in the setting of acute on chronic diastolic CHF exacerbation with poor compliance on home medications.  Was attempting to discharge to SNF when COVID swab was incidentally positive.   Assessment & Plan:   Active Problems:   CKD stage 3 due to type 2 diabetes mellitus (HCC)   Essential hypertension, benign   Acute on chronic diastolic (congestive) heart failure (HCC)   COVID   Significant volume overload with anasarca and acute on chronic systolic CHF exacerbation -improving -due to noncompliance with home medications -Last 2D echocardiogram performed on10/16/2021 with LVEF 60-65% and grade 3 diastolic dysfunction, repeated Echo 1/18 with LVEF 35-40% -diuretics changed to PO torsemide on 1/28: If renal function worsens, may need to reduce dosing to 60mg  BID. -Monitor daily weights and strict I's and O's -Hypoalbuminemia may also be playing a role in overall edema.  received albumin infusion 10/28/2020. -cardiology consulted: added entresto 1/21 . -per prior MD refusing tele  Elbow pain - possible hairline fracture -s/p fall -unclear timing -xray inconclusive -ice/tylenol -appreciate ortho: active ROM w/o immobilization  Wide-Complex Tachycardia/Possible Atypical Atrial Flutter - He had up to 25 beats of NSVT on 10/27/2020 and was hypoglycemic at 25 during that time. He has since refused to wear his telemetry monitor. Not felt to be a candidate for an ICD given his medication noncompliance and noncompliance with visits as an outpatient. He has been started on Toprol-XL 25mg  daily.  - Was already on Eliquis 5mg  BID prior to admission given history of a DVT and will continue with anticoagulation at this time in the setting of possible atrial flutter  Mild  hypoxemia -Patient became more dyspneic with some mild hypoxemia noted on 1/18, no chest pain -Chest x-ray with cardiomegaly and pulmonary vascular congestion, but no frank edema -resolved-- off O2  Hyperglycemia in the setting of type 2 diabetes-improved -Noncompliant with home medications -reportedly, he is ordering food/pizza from outside restaurants intermittently.  This makes it very challenging to manage his diabetes. -add meal coverage and decrease lantus for now  Dyslipidemia -Continue Lipitor  CAD with prior stent placement -Continue on aspirin, Eliquis, and Lipitor -Resume home b-blocker  GERD -Famotidine  COPD -No acute bronchospasms currently noted -Continue Dulera -Duo nebs with Xopenex ordered as needed shortness of breath or wheezing  CKD stage IIIa: At baseline -daily labs  incidental COVID 19 infection-- no symptoms  DVT prophylaxis:Eliquis Code Status:Full Disposition Plan: Status is: Inpatient  Remains inpatient appropriate because:IV treatments appropriate due to intensity of illness or inability to take PO and Inpatient level of care appropriate due to severity of illness  Dispo: The patient is from:Home Anticipated d/c is to:SNF when COVID bed available or after 10 days    Consultants:  cards    Subjective: Thinks he needs a breathing treatment  Objective: Vitals:   11/05/20 0500 11/05/20 0600 11/05/20 2315 11/06/20 0830  BP: 135/80  124/71   Pulse: 89  (!) 121   Resp: 17  18   Temp: 98.3 F (36.8 C)  98.2 F (36.8 C)   TempSrc: Oral  Oral   SpO2:   95% 90%  Weight:  104.7 kg    Height:        Intake/Output Summary (Last 24 hours) at 11/06/2020 1346 Last data filed at 11/06/2020 1100  Gross per 24 hour  Intake -  Output 3900 ml  Net -3900 ml   Filed Weights   11/03/20 0513 11/04/20 0446 11/05/20 0600  Weight: 107.3 kg 105.2 kg 104.7 kg    Examination:  General:  Appearance:     Overweight male in no acute distress     Lungs:     respirations unlabored, no wheezing  Heart:    Tachycardic. Normal rhythm. No murmurs, rubs, or gallops.   MS:   All extremities are intact. Left elbow swollen  Neurologic:   Awake, alert      Data Reviewed: I have personally reviewed following labs and imaging studies  CBC: Recent Labs  Lab 11/04/20 0445  WBC 7.3  HGB 10.4*  HCT 33.4*  MCV 93.6  PLT 937*   Basic Metabolic Panel: Recent Labs  Lab 10/31/20 0500 11/01/20 0631 11/02/20 0933 11/03/20 0411 11/04/20 0445 11/05/20 0422 11/06/20 0807  NA 136   < > 132* 134* 131* 132* 133*  K 3.9   < > 3.5 3.8 3.4* 3.7 3.2*  CL 94*   < > 90* 91* 88* 88* 89*  CO2 31   < > 34* 31 34* 35* 34*  GLUCOSE 134*   < > 196* 86 101* 125* 135*  BUN 40*   < > 31* 31* 31* 31* 33*  CREATININE 1.48*   < > 1.28* 1.24 1.43* 1.43* 1.38*  CALCIUM 8.7*   < > 8.6* 8.6* 8.8* 8.9 8.4*  MG 1.8  --  1.7  --   --   --   --    < > = values in this interval not displayed.   GFR: Estimated Creatinine Clearance: 78 mL/min (A) (by C-G formula based on SCr of 1.38 mg/dL (H)). Liver Function Tests: No results for input(s): AST, ALT, ALKPHOS, BILITOT, PROT, ALBUMIN in the last 168 hours. No results for input(s): LIPASE, AMYLASE in the last 168 hours. No results for input(s): AMMONIA in the last 168 hours. Coagulation Profile: No results for input(s): INR, PROTIME in the last 168 hours. Cardiac Enzymes: No results for input(s): CKTOTAL, CKMB, CKMBINDEX, TROPONINI in the last 168 hours. BNP (last 3 results) No results for input(s): PROBNP in the last 8760 hours. HbA1C: No results for input(s): HGBA1C in the last 72 hours. CBG: Recent Labs  Lab 11/04/20 1603 11/05/20 1724 11/05/20 2008 11/06/20 0744 11/06/20 1159  GLUCAP 135* 180* 256* 142* 122*   Lipid Profile: No results for input(s): CHOL, HDL, LDLCALC, TRIG, CHOLHDL, LDLDIRECT in the last 72 hours. Thyroid Function  Tests: No results for input(s): TSH, T4TOTAL, FREET4, T3FREE, THYROIDAB in the last 72 hours. Anemia Panel: No results for input(s): VITAMINB12, FOLATE, FERRITIN, TIBC, IRON, RETICCTPCT in the last 72 hours. Sepsis Labs: No results for input(s): PROCALCITON, LATICACIDVEN in the last 168 hours.  Recent Results (from the past 240 hour(s))  SARS CORONAVIRUS 2 (TAT 6-24 HRS) Nasopharyngeal Nasopharyngeal Swab     Status: Abnormal   Collection Time: 11/04/20 10:07 AM   Specimen: Nasopharyngeal Swab  Result Value Ref Range Status   SARS Coronavirus 2 POSITIVE (A) NEGATIVE Final    Comment: (NOTE) SARS-CoV-2 target nucleic acids are DETECTED.  The SARS-CoV-2 RNA is generally detectable in upper and lower respiratory specimens during the acute phase of infection. Positive results are indicative of the presence of SARS-CoV-2 RNA. Clinical correlation with patient history and other diagnostic information is  necessary to determine patient infection status. Positive results do not rule out bacterial  infection or co-infection with other viruses.  The expected result is Negative.  Fact Sheet for Patients: SugarRoll.be  Fact Sheet for Healthcare Providers: https://www.woods-mathews.com/  This test is not yet approved or cleared by the [place] FDA and  has been authorized for detection and/or diagnosis of SARS-CoV-2 by FDA under an Emergency Use Authorization (EUA). This EUA will remain  in effect (meaning this test can be used) for the duration of the COVID-19 declaration under Section 564(b)(1) of the Act, 21 U. S.C. section 360bbb-3(b)(1), unless the authorization is terminated or revoked sooner.   Performed at [facility], [place] [address], [place], [place] [address]     Radiology Studies: DG ELBOW COMPLETE LEFT (3+VIEW)  Result Date: 11/05/2020 CLINICAL DATA:  Elbow pain with swelling history of fall EXAM: LEFT ELBOW - COMPLETE 3+  VIEW COMPARISON:  06/02/2013 FINDINGS: Assessment for radial head fracture is limited due to positioning, there is questionable minimal deformity of the radial head. No dislocation. Generalized soft tissue swelling. IMPRESSION: Questionable minimal deformity of the radial head, assessment of the radial head is limited by positioning. There is considerable soft tissue swelling. Electronically Signed   By: Donavan Foil M.D.   On: 11/05/2020 18:14   Scheduled Meds: . apixaban  5 mg Oral BID  . atorvastatin  80 mg Oral Daily  . busPIRone  7.5 mg Oral BID  . famotidine  20 mg Oral BID  . gabapentin  300 mg Oral TID  . hydrocerin   Topical BID  . insulin aspart  0-5 Units Subcutaneous QHS  . insulin aspart  0-9 Units Subcutaneous TID WC  . insulin aspart  3 Units Subcutaneous TID WC  . insulin glargine  8 Units Subcutaneous QPM  . melatonin  6 mg Oral QHS  . metoprolol succinate  25 mg Oral Daily  . mometasone-formoterol  2 puff Inhalation BID  . nystatin   Topical BID  . potassium chloride  10 mEq Oral BID  . sacubitril-valsartan  1 tablet Oral BID  . sodium chloride flush  3 mL Intravenous Q12H  . torsemide  80 mg Oral BID  . traZODone  150 mg Oral QHS   Continuous Infusions: . sodium chloride       LOS: 15 days   Time spent: 28 minutes  Geradine Girt, DO  How to contact the [facility] Attending or Consulting provider [place] or covering provider during after hours [place], for this patient?  1. Check the care team in [facility] and look for a) attending/consulting TRH provider listed and b) the [facility] team listed 2. Log into [email or web address] and use [place]'s universal password to access. If you do not have the password, please contact the hospital operator. 3. Locate the [facility] provider you are looking for under [facility] and page to a number that you can be directly reached. 4. If you still have difficulty reaching the provider, please page the [facility] (Director on Call) for the Hospitalists  listed on amion for assistance.  11/06/2020, 1:46 PM

## 2020-11-07 ENCOUNTER — Other Ambulatory Visit: Payer: Self-pay | Admitting: Family Medicine

## 2020-11-07 DIAGNOSIS — N183 Chronic kidney disease, stage 3 unspecified: Secondary | ICD-10-CM | POA: Diagnosis not present

## 2020-11-07 DIAGNOSIS — E1122 Type 2 diabetes mellitus with diabetic chronic kidney disease: Secondary | ICD-10-CM | POA: Diagnosis not present

## 2020-11-07 DIAGNOSIS — R0902 Hypoxemia: Secondary | ICD-10-CM | POA: Diagnosis not present

## 2020-11-07 DIAGNOSIS — I5033 Acute on chronic diastolic (congestive) heart failure: Secondary | ICD-10-CM | POA: Diagnosis not present

## 2020-11-07 LAB — BASIC METABOLIC PANEL
Anion gap: 10 (ref 5–15)
BUN: 32 mg/dL — ABNORMAL HIGH (ref 6–20)
CO2: 35 mmol/L — ABNORMAL HIGH (ref 22–32)
Calcium: 8.3 mg/dL — ABNORMAL LOW (ref 8.9–10.3)
Chloride: 87 mmol/L — ABNORMAL LOW (ref 98–111)
Creatinine, Ser: 1.42 mg/dL — ABNORMAL HIGH (ref 0.61–1.24)
GFR, Estimated: 58 mL/min — ABNORMAL LOW (ref >60)
Glucose, Bld: 295 mg/dL — ABNORMAL HIGH (ref 70–99)
Potassium: 3.4 mmol/L — ABNORMAL LOW (ref 3.5–5.1)
Sodium: 132 mmol/L — ABNORMAL LOW (ref 135–145)

## 2020-11-07 LAB — GLUCOSE, CAPILLARY
Glucose-Capillary: 319 mg/dL — ABNORMAL HIGH (ref 70–99)
Glucose-Capillary: 312 mg/dL — ABNORMAL HIGH (ref 70–99)
Glucose-Capillary: 275 mg/dL — ABNORMAL HIGH (ref 70–99)
Glucose-Capillary: 198 mg/dL — ABNORMAL HIGH (ref 70–99)
Glucose-Capillary: 199 mg/dL — ABNORMAL HIGH (ref 70–99)

## 2020-11-07 MED ORDER — TORSEMIDE 20 MG PO TABS
60.0000 mg | ORAL_TABLET | Freq: Two times a day (BID) | ORAL | Status: DC
Start: 2020-11-07 — End: 2020-11-18
  Administered 2020-11-07 – 2020-11-18 (×23): 60 mg via ORAL
  Filled 2020-11-07 (×22): qty 3

## 2020-11-07 MED ORDER — LIDOCAINE 5 % EX PTCH
1.0000 | MEDICATED_PATCH | CUTANEOUS | Status: DC
  Administered 2020-11-07 – 2020-11-18 (×10): 1 via TRANSDERMAL
  Filled 2020-11-07 (×11): qty 1

## 2020-11-07 MED ORDER — INSULIN GLARGINE 100 UNIT/ML ~~LOC~~ SOLN
10.0000 [IU] | Freq: Every evening | SUBCUTANEOUS | Status: DC
  Administered 2020-11-07 – 2020-11-17 (×11): 10 [IU] via SUBCUTANEOUS
  Filled 2020-11-07 (×12): qty 0.1

## 2020-11-07 MED ORDER — INSULIN ASPART 100 UNIT/ML ~~LOC~~ SOLN
6.0000 [IU] | Freq: Three times a day (TID) | SUBCUTANEOUS | Status: DC
  Administered 2020-11-07 – 2020-11-08 (×2): 6 [IU] via SUBCUTANEOUS

## 2020-11-07 NOTE — Progress Notes (Signed)
PROGRESS NOTE    Dustin Cruz  GLO:[id number] DOB: March 12, 1966 DOA: 10/22/2020 PCP: [name], DO   Brief Narrative:  Per HPI: Dustin Cruz a 55 y.o.male admitted with significant volume overload/anasarca in the setting of acute on chronic diastolic CHF exacerbation with poor compliance on home medications.  Was attempting to discharge to SNF when COVID swab was incidentally positive.   Assessment & Plan:   Active Problems:   CKD stage 3 due to type 2 diabetes mellitus (HCC)   Essential hypertension, benign   Acute on chronic diastolic (congestive) heart failure (HCC)   COVID   Significant volume overload with anasarca and acute on chronic systolic CHF exacerbation -improving -due to noncompliance with home medications -Last 2D echocardiogram performed on10/16/2021 with LVEF 60-65% and grade 3 diastolic dysfunction, repeated Echo 1/18 with LVEF 35-40% -diuretics changed to PO torsemide on 1/28:  reduced dosing to 60mg  BID due to worsening kidney function -Monitor daily weights and strict I's and O's -Hypoalbuminemia may also be playing a role in overall edema.  received albumin infusion 10/28/2020. -cardiology consulted: added entresto 1/21 . -per prior MD refusing tele  Elbow pain - possible hairline fracture -s/p fall -unclear timing -xray inconclusive -ice/tylenol/lidocaine patch -appreciate ortho: active ROM w/o immobilization  Wide-Complex Tachycardia/Possible Atypical Atrial Flutter - He had up to 25 beats of NSVT on 10/27/2020 and was hypoglycemic at 25 during that time. He has since refused to wear his telemetry monitor. Not felt to be a candidate for an ICD given his medication noncompliance and noncompliance with visits as an outpatient. He has been started on Toprol-XL 25mg  daily.  - Was already on Eliquis 5mg  BID prior to admission given history of a DVT and will continue with anticoagulation at this time in the setting of possible atrial flutter  Mild  hypoxemia -Patient became more dyspneic with some mild hypoxemia noted on 1/18, no chest pain -Chest x-ray with cardiomegaly and pulmonary vascular congestion, but no frank edema -resolved-- off O2  Hyperglycemia in the setting of type 2 diabetes-improved -Noncompliant with home medications -reportedly, he is ordering food/pizza from outside restaurants intermittently.  This makes it very challenging to manage his diabetes. -increase meal coverage and small increase in lantus for now  Dyslipidemia -Continue Lipitor  CAD with prior stent placement -Continue on aspirin, Eliquis, and Lipitor -Resume home b-blocker  GERD -Famotidine  COPD -No acute bronchospasms currently noted -Continue Dulera -Duo nebs with Xopenex ordered as needed shortness of breath or wheezing  CKD stage IIIa: At baseline -daily labs  incidental COVID 19 infection-- no symptoms  DVT prophylaxis:Eliquis Code Status:Full Disposition Plan: Status is: Inpatient  Remains inpatient appropriate because:IV treatments appropriate due to intensity of illness or inability to take PO and Inpatient level of care appropriate due to severity of illness  Dispo: The patient is from:Home Anticipated d/c is to:SNF when COVID bed available or after 10 days    Consultants:  Cards  Ortho (phone)    Subjective: Able to move arm but is sore  Objective: Vitals:   11/06/20 1924 11/06/20 2008 11/07/20 0254 11/07/20 1006  BP:  128/72 (!) 99/58   Pulse:  84 88   Resp:  16 18   Temp:  97.7 F (36.5 C) 97.9 F (36.6 C)   TempSrc:  Oral Axillary   SpO2: 98% 99% 99% 96%  Weight:      Height:        Intake/Output Summary (Last 24 hours) at 11/07/2020 1231 Last  data filed at 11/07/2020 0200 Gross per 24 hour  Intake 1200 ml  Output 1550 ml  Net -350 ml   Filed Weights   11/03/20 0513 11/04/20 0446 11/05/20 0600  Weight: 107.3 kg 105.2 kg 104.7 kg     Examination:   General: Appearance:     Overweight male in no acute distress     Lungs:     respirations unlabored  Heart:    Normal heart rate. Normal rhythm. No murmurs, rubs, or gallops.   MS:   All extremities are intact.  Swelling on left forearm, ROM intact  Neurologic:   Awake, alert, oriented x 3. Poor insight into disease     Data Reviewed: I have personally reviewed following labs and imaging studies  CBC: Recent Labs  Lab 11/04/20 0445  WBC 7.3  HGB 10.4*  HCT 33.4*  MCV 93.6  PLT 455*   Basic Metabolic Panel: Recent Labs  Lab 11/02/20 0933 11/03/20 0411 11/04/20 0445 11/05/20 0422 11/06/20 0807 11/07/20 0500  NA 132* 134* 131* 132* 133* 132*  K 3.5 3.8 3.4* 3.7 3.2* 3.4*  CL 90* 91* 88* 88* 89* 87*  CO2 34* 31 34* 35* 34* 35*  GLUCOSE 196* 86 101* 125* 135* 295*  BUN 31* 31* 31* 31* 33* 32*  CREATININE 1.28* 1.24 1.43* 1.43* 1.38* 1.42*  CALCIUM 8.6* 8.6* 8.8* 8.9 8.4* 8.3*  MG 1.7  --   --   --   --   --    GFR: Estimated Creatinine Clearance: 75.8 mL/min (A) (by C-G formula based on SCr of 1.42 mg/dL (H)). Liver Function Tests: No results for input(s): AST, ALT, ALKPHOS, BILITOT, PROT, ALBUMIN in the last 168 hours. No results for input(s): LIPASE, AMYLASE in the last 168 hours. No results for input(s): AMMONIA in the last 168 hours. Coagulation Profile: No results for input(s): INR, PROTIME in the last 168 hours. Cardiac Enzymes: No results for input(s): CKTOTAL, CKMB, CKMBINDEX, TROPONINI in the last 168 hours. BNP (last 3 results) No results for input(s): PROBNP in the last 8760 hours. HbA1C: No results for input(s): HGBA1C in the last 72 hours. CBG: Recent Labs  Lab 11/06/20 1654 11/06/20 2010 11/07/20 0219 11/07/20 0813 11/07/20 1134  GLUCAP 271* 233* 319* 312* 275*   Lipid Profile: No results for input(s): CHOL, HDL, LDLCALC, TRIG, CHOLHDL, LDLDIRECT in the last 72 hours. Thyroid Function Tests: No results for input(s):  TSH, T4TOTAL, FREET4, T3FREE, THYROIDAB in the last 72 hours. Anemia Panel: No results for input(s): VITAMINB12, FOLATE, FERRITIN, TIBC, IRON, RETICCTPCT in the last 72 hours. Sepsis Labs: No results for input(s): PROCALCITON, LATICACIDVEN in the last 168 hours.  Recent Results (from the past 240 hour(s))  SARS CORONAVIRUS 2 (TAT 6-24 HRS) Nasopharyngeal Nasopharyngeal Swab     Status: Abnormal   Collection Time: 11/04/20 10:07 AM   Specimen: Nasopharyngeal Swab  Result Value Ref Range Status   SARS Coronavirus 2 POSITIVE (A) NEGATIVE Final    Comment: (NOTE) SARS-CoV-2 target nucleic acids are DETECTED.  The SARS-CoV-2 RNA is generally detectable in upper and lower respiratory specimens during the acute phase of infection. Positive results are indicative of the presence of SARS-CoV-2 RNA. Clinical correlation with patient history and other diagnostic information is  necessary to determine patient infection status. Positive results do not rule out bacterial infection or co-infection with other viruses.  The expected result is Negative.  Fact Sheet for Patients: HairSlick.no  Fact Sheet for Healthcare Providers: quierodirigir.com  This test is  not yet approved or cleared by the [place] and  has been authorized for detection and/or diagnosis of SARS-CoV-2 by FDA under an Emergency Use Authorization (EUA). This EUA will remain  in effect (meaning this test can be used) for the duration of the COVID-19 declaration under Section 564(b)(1) of the Act, 21 U. S.C. section 360bbb-3(b)(1), unless the authorization is terminated or revoked sooner.   Performed at [facility], [place] [address], [place], [place] [address]     Radiology Studies: DG ELBOW COMPLETE LEFT (3+VIEW)  Result Date: 11/05/2020 CLINICAL DATA:  Elbow pain with swelling history of fall EXAM: LEFT ELBOW - COMPLETE 3+ VIEW COMPARISON:  06/02/2013  FINDINGS: Assessment for radial head fracture is limited due to positioning, there is questionable minimal deformity of the radial head. No dislocation. Generalized soft tissue swelling. IMPRESSION: Questionable minimal deformity of the radial head, assessment of the radial head is limited by positioning. There is considerable soft tissue swelling. Electronically Signed   By: Donavan Foil M.D.   On: 11/05/2020 18:14   Scheduled Meds: . apixaban  5 mg Oral BID  . atorvastatin  80 mg Oral Daily  . busPIRone  7.5 mg Oral BID  . famotidine  20 mg Oral BID  . gabapentin  300 mg Oral TID  . hydrocerin   Topical BID  . insulin aspart  0-5 Units Subcutaneous QHS  . insulin aspart  0-9 Units Subcutaneous TID WC  . insulin aspart  3 Units Subcutaneous TID WC  . insulin glargine  8 Units Subcutaneous QPM  . lidocaine  1 patch Transdermal Q24H  . melatonin  6 mg Oral QHS  . metoprolol succinate  25 mg Oral Daily  . mometasone-formoterol  2 puff Inhalation BID  . nystatin   Topical BID  . potassium chloride  20 mEq Oral BID  . sacubitril-valsartan  1 tablet Oral BID  . sodium chloride flush  3 mL Intravenous Q12H  . torsemide  60 mg Oral BID  . traZODone  150 mg Oral QHS   Continuous Infusions: . sodium chloride       LOS: 16 days   Time spent: 28 minutes  Geradine Girt, DO  How to contact the [name] Attending or Consulting provider [place] or covering provider during after hours [place], for this patient?  1. Check the care team in [facility] and look for a) attending/consulting TRH provider listed and b) the [facility] team listed 2. Log into [email or web address] and use [place]'s universal password to access. If you do not have the password, please contact the hospital operator. 3. Locate the [facility] provider you are looking for under [facility] and page to a number that you can be directly reached. 4. If you still have difficulty reaching the provider, please page the [facility] (Director on Call) for the  Hospitalists listed on amion for assistance.  11/07/2020, 12:31 PM

## 2020-11-07 NOTE — Progress Notes (Signed)
Patient has called out frequently with random requests (water, snacks, elbow pain, pad in the floor, blankets need to be rearranged).  Educated patient on the importance of working together to minimize PPE usage by requesting everything at once instead of calling every 30 minutes.  Also reiterated to patient that the goal was for him to do what he can do for himself and for use to assist in doing what he cannot do.  Patient educated on his fluid restriction and elevated blood sugars.  After patient received at bath and linen change I asked if he needed anything else? He stated, "not that I can think of".  I told him I would be back in 2 hours, if he needed anything we would make a note of it and bring everything in at once.  Within 5 minutes patient was calling out for water. Once again, education on fluid restriction provided.  Within an hour patient called out saying he was having "trouble breathing".  Patient was in no distress, has been on RA all night, color was pink and appropriate, no accessory muscle use, tachypnea or dyspnea noted.  Oxygen saturation was 98% and patient able to speak in full sentences.  Patient then requested water.  Educated provided again.

## 2020-11-08 DIAGNOSIS — I5033 Acute on chronic diastolic (congestive) heart failure: Secondary | ICD-10-CM | POA: Diagnosis not present

## 2020-11-08 DIAGNOSIS — U071 COVID-19: Secondary | ICD-10-CM | POA: Diagnosis not present

## 2020-11-08 DIAGNOSIS — I10 Essential (primary) hypertension: Secondary | ICD-10-CM | POA: Diagnosis not present

## 2020-11-08 DIAGNOSIS — E1122 Type 2 diabetes mellitus with diabetic chronic kidney disease: Secondary | ICD-10-CM | POA: Diagnosis not present

## 2020-11-08 LAB — BASIC METABOLIC PANEL
Anion gap: 9 (ref 5–15)
BUN: 32 mg/dL — ABNORMAL HIGH (ref 6–20)
CO2: 35 mmol/L — ABNORMAL HIGH (ref 22–32)
Calcium: 8.2 mg/dL — ABNORMAL LOW (ref 8.9–10.3)
Chloride: 90 mmol/L — ABNORMAL LOW (ref 98–111)
Creatinine, Ser: 1.43 mg/dL — ABNORMAL HIGH (ref 0.61–1.24)
GFR, Estimated: 58 mL/min — ABNORMAL LOW (ref >60)
Glucose, Bld: 164 mg/dL — ABNORMAL HIGH (ref 70–99)
Potassium: 3.6 mmol/L (ref 3.5–5.1)
Sodium: 134 mmol/L — ABNORMAL LOW (ref 135–145)

## 2020-11-08 LAB — GLUCOSE, CAPILLARY
Glucose-Capillary: 205 mg/dL — ABNORMAL HIGH (ref 70–99)
Glucose-Capillary: 136 mg/dL — ABNORMAL HIGH (ref 70–99)
Glucose-Capillary: 112 mg/dL — ABNORMAL HIGH (ref 70–99)
Glucose-Capillary: 178 mg/dL — ABNORMAL HIGH (ref 70–99)
Glucose-Capillary: 163 mg/dL — ABNORMAL HIGH (ref 70–99)
Glucose-Capillary: 109 mg/dL — ABNORMAL HIGH (ref 70–99)
Glucose-Capillary: 75 mg/dL (ref 70–99)
Glucose-Capillary: 116 mg/dL — ABNORMAL HIGH (ref 70–99)
Glucose-Capillary: 246 mg/dL — ABNORMAL HIGH (ref 70–99)

## 2020-11-08 NOTE — Plan of Care (Signed)

## 2020-11-08 NOTE — TOC Progression Note (Signed)
Transition of Care [facility]) - Progression Note    Patient Details  Name: Dustin Cruz MRN: [id number] Date of Birth: Feb 23, 1966  Transition of [facility] CM/SW Contact  Shade Flood, LCSW Phone Number: 11/08/2020, 2:14 PM  Clinical Narrative:     TOC following. Calls made to follow up on [facility] referrals. [facility] declines. Message left for [facility]. Miquel Dunn will check and return call. Message left for [place]. Message left for [place], [place], [place], [place], and [place].  Updated TOC supervisor. Will follow.  Expected Discharge Plan: Skilled Nursing Facility Barriers to Discharge: No SNF bed,SNF Pending bed offer  Expected Discharge Plan and Services Expected Discharge Plan: [place] In-house Referral: Clinical Social Work Discharge Planning Services: NA Post Acute Care Choice: NA Living arrangements for the past 2 months: Homeless                 DME Arranged: N/A DME Agency: NA       HH Arranged: NA HH Agency: NA         Social Determinants of Health (SDOH) Interventions    Readmission Risk Interventions Readmission Risk Prevention Plan 10/25/2020 09/29/2020  Transportation Screening Complete Complete  Medication Review Press photographer) Complete Complete  HRI or Home Care Consult Complete Complete  SW Recovery Care/Counseling Consult Complete Complete  Palliative Care Screening Not Applicable Not Applicable  Skilled Nursing Facility Not Applicable Not Complete  Some recent data might be hidden

## 2020-11-08 NOTE — Progress Notes (Signed)
PROGRESS NOTE    Dustin Cruz  YIR:[id number] DOB: 1966/04/02 DOA: 10/22/2020 PCP: [name], DO   Brief Narrative:  Per HPI: Dustin Cruz a 55 y.o.male admitted with significant volume overload/anasarca in the setting of acute on chronic diastolic CHF exacerbation with poor compliance on home medications.  Was attempting to discharge to SNF when COVID swab was incidentally positive.   Assessment & Plan:   Active Problems:   CKD stage 3 due to type 2 diabetes mellitus (HCC)   Essential hypertension, benign   Acute on chronic diastolic (congestive) heart failure (HCC)   COVID   Significant volume overload with anasarca and acute on chronic systolic CHF exacerbation -improving -due to noncompliance with home medications -Last 2D echocardiogram performed on10/16/2021 with LVEF 60-65% and grade 3 diastolic dysfunction, repeated Echo 1/18 with LVEF 35-40% -diuretics changed to PO torsemide on 1/28:  reduced dosing to 60mg  BID due to worsening kidney function -Monitor daily weights and strict I's and O's -Hypoalbuminemia may also be playing a role in overall edema.  received albumin infusion 10/28/2020. -cardiology consulted: added entresto 1/21 . -per prior MD refusing tele  Elbow pain - possible hairline fracture -s/p fall -unclear timing -xray inconclusive -ice/tylenol/lidocaine patch -appreciate ortho: active ROM w/o immobilization  Wide-Complex Tachycardia/Possible Atypical Atrial Flutter - He had up to 25 beats of NSVT on 10/27/2020 and was hypoglycemic at 25 during that time. He has since refused to wear his telemetry monitor. Not felt to be a candidate for an ICD given his medication noncompliance and noncompliance with visits as an outpatient. He has been started on Toprol-XL 25mg  daily.  - Was already on Eliquis 5mg  BID prior to admission given history of a DVT and will continue with anticoagulation at this time in the setting of possible atrial flutter  Mild  hypoxemia -Patient became more dyspneic with some mild hypoxemia noted on 1/18, no chest pain -Chest x-ray with cardiomegaly and pulmonary vascular congestion, but no frank edema -resolved-- off O2  Hyperglycemia in the setting of type 2 diabetes-improved -Noncompliant with home medications -reportedly, he is ordering food/pizza from outside restaurants intermittently.  This makes it very challenging to manage his diabetes. -increase meal coverage and small increase in lantus for now  Dyslipidemia -Continue Lipitor  CAD with prior stent placement -Continue on aspirin, Eliquis, and Lipitor -Resume home b-blocker  GERD -Famotidine  COPD -No acute bronchospasms currently noted -Continue Dulera -Duo nebs with Xopenex ordered as needed shortness of breath or wheezing  CKD stage IIIa: At baseline -daily labs  incidental COVID 19 infection-- no symptoms  DVT prophylaxis:Eliquis Code Status:Full Disposition Plan: Status is: Inpatient  Remains inpatient appropriate because:IV treatments appropriate due to intensity of illness or inability to take PO and Inpatient level of care appropriate due to severity of illness  Dispo: The patient is from:Home Anticipated d/c is to:SNF when COVID bed available or after 10 days    Consultants:  Cards  Ortho (phone)    Subjective: Lidocaine patch helped arm a lot yesterday  Objective: Vitals:   11/07/20 1923 11/07/20 2115 11/08/20 0359 11/08/20 0757  BP:  127/79 120/80   Pulse:  87 88   Resp:  18 18   Temp:  98.8 F (37.1 C) 97.7 F (36.5 C)   TempSrc:  Oral Oral   SpO2: 97% 98% 95% 93%  Weight:   99.5 kg   Height:        Intake/Output Summary (Last 24 hours) at 11/08/2020 1203 Last  data filed at 11/08/2020 0900 Gross per 24 hour  Intake 600 ml  Output 1200 ml  Net -600 ml   Filed Weights   11/04/20 0446 11/05/20 0600 11/08/20 0359  Weight: 105.2 kg 104.7 kg 99.5 kg     Examination:   General: Appearance:     Overweight male in no acute distress     Lungs:     Not on O2, respirations unlabored  Heart:    Normal heart rate. Normal rhythm. No murmurs, rubs, or gallops.      Neurologic:   Awake, alert, oriented x 3.      Data Reviewed: I have personally reviewed following labs and imaging studies  CBC: Recent Labs  Lab 11/04/20 0445  WBC 7.3  HGB 10.4*  HCT 33.4*  MCV 93.6  PLT 703*   Basic Metabolic Panel: Recent Labs  Lab 11/02/20 0933 11/03/20 0411 11/04/20 0445 11/05/20 0422 11/06/20 0807 11/07/20 0500 11/08/20 0306  NA 132*   < > 131* 132* 133* 132* 134*  K 3.5   < > 3.4* 3.7 3.2* 3.4* 3.6  CL 90*   < > 88* 88* 89* 87* 90*  CO2 34*   < > 34* 35* 34* 35* 35*  GLUCOSE 196*   < > 101* 125* 135* 295* 164*  BUN 31*   < > 31* 31* 33* 32* 32*  CREATININE 1.28*   < > 1.43* 1.43* 1.38* 1.42* 1.43*  CALCIUM 8.6*   < > 8.8* 8.9 8.4* 8.3* 8.2*  MG 1.7  --   --   --   --   --   --    < > = values in this interval not displayed.   GFR: Estimated Creatinine Clearance: 73.6 mL/min (A) (by C-G formula based on SCr of 1.43 mg/dL (H)). Liver Function Tests: No results for input(s): AST, ALT, ALKPHOS, BILITOT, PROT, ALBUMIN in the last 168 hours. No results for input(s): LIPASE, AMYLASE in the last 168 hours. No results for input(s): AMMONIA in the last 168 hours. Coagulation Profile: No results for input(s): INR, PROTIME in the last 168 hours. Cardiac Enzymes: No results for input(s): CKTOTAL, CKMB, CKMBINDEX, TROPONINI in the last 168 hours. BNP (last 3 results) No results for input(s): PROBNP in the last 8760 hours. HbA1C: No results for input(s): HGBA1C in the last 72 hours. CBG: Recent Labs  Lab 11/07/20 1649 11/07/20 2113 11/08/20 0240 11/08/20 0736 11/08/20 1109  GLUCAP 198* 199* 163* 109* 75   Lipid Profile: No results for input(s): CHOL, HDL, LDLCALC, TRIG, CHOLHDL, LDLDIRECT in the last 72 hours. Thyroid  Function Tests: No results for input(s): TSH, T4TOTAL, FREET4, T3FREE, THYROIDAB in the last 72 hours. Anemia Panel: No results for input(s): VITAMINB12, FOLATE, FERRITIN, TIBC, IRON, RETICCTPCT in the last 72 hours. Sepsis Labs: No results for input(s): PROCALCITON, LATICACIDVEN in the last 168 hours.  Recent Results (from the past 240 hour(s))  SARS CORONAVIRUS 2 (TAT 6-24 HRS) Nasopharyngeal Nasopharyngeal Swab     Status: Abnormal   Collection Time: 11/04/20 10:07 AM   Specimen: Nasopharyngeal Swab  Result Value Ref Range Status   SARS Coronavirus 2 POSITIVE (A) NEGATIVE Final    Comment: (NOTE) SARS-CoV-2 target nucleic acids are DETECTED.  The SARS-CoV-2 RNA is generally detectable in upper and lower respiratory specimens during the acute phase of infection. Positive results are indicative of the presence of SARS-CoV-2 RNA. Clinical correlation with patient history and other diagnostic information is  necessary to determine patient infection status.  Positive results do not rule out bacterial infection or co-infection with other viruses.  The expected result is Negative.  Fact Sheet for Patients: SugarRoll.be  Fact Sheet for Healthcare Providers: https://www.woods-mathews.com/  This test is not yet approved or cleared by the [place] FDA and  has been authorized for detection and/or diagnosis of SARS-CoV-2 by FDA under an Emergency Use Authorization (EUA). This EUA will remain  in effect (meaning this test can be used) for the duration of the COVID-19 declaration under Section 564(b)(1) of the Act, 21 U. S.C. section 360bbb-3(b)(1), unless the authorization is terminated or revoked sooner.   Performed at [facility], [place] [address], [place], [place] [address]     Radiology Studies: No results found. Scheduled Meds: . apixaban  5 mg Oral BID  . atorvastatin  80 mg Oral Daily  . busPIRone  7.5 mg Oral BID  .  famotidine  20 mg Oral BID  . gabapentin  300 mg Oral TID  . hydrocerin   Topical BID  . insulin aspart  0-5 Units Subcutaneous QHS  . insulin aspart  0-9 Units Subcutaneous TID WC  . insulin aspart  6 Units Subcutaneous TID WC  . insulin glargine  10 Units Subcutaneous QPM  . lidocaine  1 patch Transdermal Q24H  . melatonin  6 mg Oral QHS  . metoprolol succinate  25 mg Oral Daily  . mometasone-formoterol  2 puff Inhalation BID  . nystatin   Topical BID  . potassium chloride  20 mEq Oral BID  . sacubitril-valsartan  1 tablet Oral BID  . sodium chloride flush  3 mL Intravenous Q12H  . torsemide  60 mg Oral BID  . traZODone  150 mg Oral QHS   Continuous Infusions: . sodium chloride       LOS: 17 days   Time spent: 28 minutes  Geradine Girt, DO  How to contact the [facility] Attending or Consulting provider [place] or covering provider during after hours [place], for this patient?  1. Check the care team in [facility] and look for a) attending/consulting TRH provider listed and b) the [facility] team listed 2. Log into [email or web address] and use [place]'s universal password to access. If you do not have the password, please contact the hospital operator. 3. Locate the [facility] provider you are looking for under [facility] and page to a number that you can be directly reached. 4. If you still have difficulty reaching the provider, please page the [facility] (Director on Call) for the Hospitalists listed on amion for assistance.  11/08/2020, 12:03 PM

## 2020-11-08 NOTE — Progress Notes (Signed)
Physical Therapy Treatment Patient Details Name: Dustin Cruz MRN: [id number] DOB: 03/18/1966 Today's Date: 11/08/2020    History of Present Illness Dustin Cruz is a 55 y.o. male with medical history significant for CAD, type 2 diabetes, diastolic heart failure, GERD, and COPD who presented to the ED with worsening lower extremity edema as well as abdominal edema and scrotal edema as well as some weakness.  He is currently homeless and lives in his car.  He states that he had run out of his Lasix as well as his diabetes medication for the last few days.  It appears that on last admission on 09/2020 that he left AMA as he was being treated for edema at that time.  He denies any fever, chills, abdominal pain, nausea, vomiting, chest pain, or other concerns.    PT Comments    Patient progressing well with PT sessions. Continues to need RW due to decreased balance with upright activities. Patient's personal quad cane is in anteroom if/when trial becomes appropriate. Ambulation with RW was slow, somewhat labored gait; on room air; increased height of RW by 5 holes with improved trunk posture during ambulation. Limited by fatigue. Performed therapeutic exercises seated at edge of bed.   Follow Up Recommendations  SNF;Supervision for mobility/OOB;Supervision - Intermittent     Equipment Recommendations  Other (comment) (Rollator walker)    Recommendations for Other Services       Precautions / Restrictions Precautions Precautions: Fall    Mobility  Bed Mobility   Bed Mobility: Supine to Sit;Sit to Supine     Supine to sit: Modified independent (Device/Increase time) Sit to supine: Modified independent (Device/Increase time)   General bed mobility comments: increased time  Transfers Overall transfer level: Needs assistance Equipment used: Rolling walker (2 wheeled) Transfers: Sit to/from [facility] Sit to Stand: Supervision Stand pivot transfers: Supervision        General transfer comment: sit to stand x2 with decreased difficulty powering up on second rep. Use of upper extremities on solid surface.  Ambulation/Gait Ambulation/Gait assistance: Supervision Gait Distance (Feet): 40 Feet Assistive device: Rolling walker (2 wheeled) Gait Pattern/deviations: Step-through pattern;Decreased step length - right;Decreased step length - left;Decreased stride length;Trunk flexed;Wide base of support Gait velocity: decreased.   General Gait Details: slow, somewhat labored gait; on room air; increased height of RW by 5 holes with improved trunk posture during ambulation; limited by fatigue.   Stairs    Wheelchair Mobility    Modified Rankin (Stroke Patients Only)       Balance Overall balance assessment: Needs assistance Sitting-balance support: Feet supported;No upper extremity supported Sitting balance-Leahy Scale: Fair Sitting balance - Comments: fair/good seated at EOB   Standing balance support: Bilateral upper extremity supported;During functional activity Standing balance-Leahy Scale: Fair Standing balance comment: fair/good with RW       Cognition Arousal/Alertness: Awake/alert Behavior During Therapy: WFL for tasks assessed/performed Overall Cognitive Status: Within Functional Limits for tasks assessed      Exercises General Exercises - Lower Extremity Long Arc Quad: Seated;AROM;Strengthening;Both;10 reps Hip Flexion/Marching: Seated;Strengthening;Both;AAROM;5 reps (c/o fatigue) Toe Raises: Seated;AROM;Strengthening;Both;10 reps Heel Raises: Seated;AROM;Strengthening;Both;10 reps Shoulder Exercises Pendulum Exercise: AROM;Strengthening;Both;10 reps;Seated    General Comments        Pertinent Vitals/Pain Pain Assessment: Faces Faces Pain Scale: Hurts little more Pain Location: Left elbow    Home Living        Prior Function        PT Goals (current goals can now be found in  the care plan section) Acute Rehab PT  Goals Patient Stated Goal: Get stronger PT Goal Formulation: With patient Time For Goal Achievement: 11/08/20 Potential to Achieve Goals: Good Progress towards PT goals: Progressing toward goals    Frequency    Min 3X/week      PT Plan Current plan remains appropriate       AM-PAC PT "6 Clicks" Mobility   Outcome Measure  Help needed turning from your back to your side while in a flat bed without using bedrails?: None Help needed moving from lying on your back to sitting on the side of a flat bed without using bedrails?: A Little Help needed moving to and from a bed to a chair (including a wheelchair)?: A Little Help needed standing up from a chair using your arms (e.g., wheelchair or bedside chair)?: A Little Help needed to walk in hospital room?: A Little Help needed climbing 3-5 steps with a railing? : A Lot 6 Click Score: 18    End of Session Equipment Utilized During Treatment: Gait belt Activity Tolerance: Patient tolerated treatment well;No increased pain;Patient limited by fatigue Patient left: in bed;with call bell/phone within reach Nurse Communication: Mobility status PT Visit Diagnosis: Unsteadiness on feet (R26.81);Other abnormalities of gait and mobility (R26.89);Muscle weakness (generalized) (M62.81)     Time: 1310-1340 PT Time Calculation (min) (ACUTE ONLY): 30 min  Charges:  $Therapeutic Exercise: 8-22 mins $Therapeutic Activity: 8-22 mins                     Floria Raveling. Hartnett-Rands, MS, PT Per [place] #[id number] 11/08/2020, 1:45 PM

## 2020-11-08 NOTE — Telephone Encounter (Signed)
NOV 

## 2020-11-09 DIAGNOSIS — L97929 Non-pressure chronic ulcer of unspecified part of left lower leg with unspecified severity: Secondary | ICD-10-CM | POA: Diagnosis not present

## 2020-11-09 DIAGNOSIS — R0902 Hypoxemia: Secondary | ICD-10-CM | POA: Diagnosis not present

## 2020-11-09 DIAGNOSIS — U071 COVID-19: Secondary | ICD-10-CM | POA: Diagnosis not present

## 2020-11-09 LAB — GLUCOSE, CAPILLARY
Glucose-Capillary: 196 mg/dL — ABNORMAL HIGH (ref 70–99)
Glucose-Capillary: 171 mg/dL — ABNORMAL HIGH (ref 70–99)
Glucose-Capillary: 143 mg/dL — ABNORMAL HIGH (ref 70–99)
Glucose-Capillary: 215 mg/dL — ABNORMAL HIGH (ref 70–99)
Glucose-Capillary: 303 mg/dL — ABNORMAL HIGH (ref 70–99)

## 2020-11-09 MED ORDER — GABAPENTIN 300 MG PO CAPS
300.0000 mg | ORAL_CAPSULE | Freq: Every day | ORAL | Status: DC
  Administered 2020-11-10 – 2020-11-17 (×8): 300 mg via ORAL
  Filled 2020-11-09 (×8): qty 1

## 2020-11-09 MED ORDER — ENSURE MAX PROTEIN PO LIQD
11.0000 [oz_av] | Freq: Every day | ORAL | Status: DC
  Administered 2020-11-09 – 2020-11-10 (×2): 11 [oz_av] via ORAL

## 2020-11-09 NOTE — Progress Notes (Signed)
Nutrition Follow-up  DOCUMENTATION CODES:   Obesity unspecified  INTERVENTION:  Ensure Max po daily, each supplement provides 150 kcal and 30 grams of protein  Diet education has been provided  NUTRITION DIAGNOSIS:   Food and nutrition related knowledge deficit related to limited prior education (CHF) as evidenced by per patient/family report. -ongoing  GOAL:    (Patient will follow low sodium /CHO modifed diet) -met  MONITOR:   PO intake,Weight trends,I & O's,Labs,Skin  REASON FOR ASSESSMENT:   Consult Diet education  ASSESSMENT:   Patient is a 55 yo male with hx of DM-2, CHF, CKD-3, COPD, Chronic depression, CAD. Patient is homeless and has been living in his car. He presents with volume overload, non-compliance. Acute on chronic CHF.  RD working remotely.  Pt with plans to discharge to SNF and found to be incidentally positive for COVID-19. Pt is asymptomatic at this time.  Pt continues to have a good appetite and oral intake, eating 100% of all meals. Pt on HH/CM diet with 1500 ml fluid restriction. Diet is restrictive of protein and daily caloric intake. Will add Ensure Max daily to help him meet his needs.   Significant volume overload with anasarca and acute on chronic systolic CHF exacerbation, improving. Weight 97.6 kg on 2/01 down 19.2 kg from 116.8 kg; significant I/Os: -9759 ml since admit UOP: 440 ml x 24 hours  Anticipated d/c to SNF when COVID bed available or after 10 days.  Medications reviewed and include: Pepcid, Gabapentin, SSI, Lantus 10 units QHS, Melatonin, Klor-con, Demadex 60 mg 2 times daily  Labs: CBGs 143,171,196,246,116, Na 134 (L), BUN 32 (H), Cr 1.43 (H)    Diet Order:   Diet Order            Diet heart healthy/carb modified Room service appropriate? Yes; Fluid consistency: Thin; Fluid restriction: 1500 mL Fluid  Diet effective now                 EDUCATION NEEDS:   Education needs have been addressed  Skin:  Skin  Assessment: Reviewed RN Assessment  Last BM:  1/29  Height:   Ht Readings from Last 1 Encounters:  11/08/20 '6\' 2"'  (1.88 m)    Weight:   Wt Readings from Last 1 Encounters:  11/09/20 97.6 kg    BMI:  Body mass index is 27.63 kg/m.  Estimated Nutritional Needs:   Kcal:  2074-2281  Protein:  112-120 gr  Fluid:  1500 ml daily per MD   Lajuan Lines, RD, LDN Clinical Nutrition After Hours/Weekend Pager # in [place]

## 2020-11-09 NOTE — Progress Notes (Signed)
Physical Therapy Treatment Patient Details Name: Dustin Cruz MRN: [id number] DOB: 09-27-1966 Today's Date: 11/09/2020    History of Present Illness Dustin Cruz is a 55 y.o. male with medical history significant for CAD, type 2 diabetes, diastolic heart failure, GERD, and COPD who presented to the ED with worsening lower extremity edema as well as abdominal edema and scrotal edema as well as some weakness.  He is currently homeless and lives in his car.  He states that he had run out of his Lasix as well as his diabetes medication for the last few days.  It appears that on last admission on 09/2020 that he left AMA as he was being treated for edema at that time.  He denies any fever, chills, abdominal pain, nausea, vomiting, chest pain, or other concerns.    PT Comments    Pt sitting up on side of bed stating he has been ambulating in his room and has been doing well overall besides a little weakness in his LE's.  Discussed wounds on feet and suggested compression would help to heal the wounds along with outpatient wound care upon discharge.  Pt able to stand and maintain balance and ambulate approx 50 feet in room using his quad cane.  Pt without LOB or noted shortness of breath during session today.  Pt returned to edge of bed per request.     Follow Up Recommendations  SNF;Supervision for mobility/OOB;Supervision - Intermittent     Equipment Recommendations  Other (comment)    Recommendations for Other Services Other (comment) (outpatient wound care/compression, strengthening)     Precautions / Restrictions Precautions Precautions: Fall Restrictions Weight Bearing Restrictions: No    Mobility  Bed Mobility           Sit to supine: Modified independent (Device/Increase time)      Transfers Overall transfer level: Modified independent Equipment used: Quad cane Transfers: Sit to/from Stand Sit to Stand: Supervision         General transfer comment: Pt a little  unsteady upon standing but able to maintain stability and ambulate without LOB  Ambulation/Gait Ambulation/Gait assistance: Supervision Gait Distance (Feet): 50 Feet Assistive device: Quad cane Gait Pattern/deviations: Step-through pattern;Decreased step length - right;Decreased step length - left;Decreased stride length;Trunk flexed;Wide base of support         Stairs             Wheelchair Mobility    Modified Rankin (Stroke Patients Only)          Cognition Arousal/Alertness: Awake/alert Behavior During Therapy: WFL for tasks assessed/performed Overall Cognitive Status: Within Functional Limits for tasks assessed                                      Pertinent Vitals/Pain Pain Assessment: No/denies pain        PT Goals (current goals can now be found in the care plan section) Acute Rehab PT Goals Patient Stated Goal: Get stronger PT Goal Formulation: With patient Progress towards PT goals: Progressing toward goals    Frequency    Min 3X/week       AM-PAC PT "6 Clicks" Mobility   Outcome Measure  Help needed turning from your back to your side while in a flat bed without using bedrails?: None Help needed moving from lying on your back to sitting on the side of a flat bed without using bedrails?: None Help  needed moving to and from a bed to a chair (including a wheelchair)?: A Little Help needed standing up from a chair using your arms (e.g., wheelchair or bedside chair)?: A Little Help needed to walk in hospital room?: A Little Help needed climbing 3-5 steps with a railing? : A Lot 6 Click Score: 19    End of Session Equipment Utilized During Treatment: Gait belt Activity Tolerance: Patient tolerated treatment well;No increased pain;Patient limited by fatigue Patient left: in bed;with call bell/phone within reach   PT Visit Diagnosis: Unsteadiness on feet (R26.81);Other abnormalities of gait and mobility (R26.89);Muscle weakness  (generalized) (M62.81)     Time: 4034-7425 PT Time Calculation (min) (ACUTE ONLY): 15 min  Charges:  $Therapeutic Activity: 8-22 mins                     Teena Irani, PTA/CLT [phone]    Roseanne Reno B 11/09/2020, 4:42 PM

## 2020-11-09 NOTE — Progress Notes (Signed)
PROGRESS NOTE    Dustin Cruz  TIW:[id number] DOB: 1965-12-11 DOA: 10/22/2020 PCP: [name], DO   Brief Narrative:  Per HPI: Dustin Cruz a 55 y.o.male admitted with significant volume overload/anasarca in the setting of acute on chronic diastolic CHF exacerbation with poor compliance on home medications.  Was attempting to discharge to SNF when COVID swab was incidentally positive.   Assessment & Plan:   Active Problems:   CKD stage 3 due to type 2 diabetes mellitus (HCC)   Essential hypertension, benign   Acute on chronic diastolic (congestive) heart failure (HCC)   COVID   Significant volume overload with anasarca and acute on chronic systolic CHF exacerbation -improving -due to noncompliance with home medications -Last 2D echocardiogram performed on10/16/2021 with LVEF 60-65% and grade 3 diastolic dysfunction, repeated Echo 1/18 with LVEF 35-40% -diuretics changed to PO torsemide on 1/28:  reduced dosing to 60mg  BID due to worsening kidney function -Monitor daily weights and strict I's and O's -Hypoalbuminemia may also be playing a role in overall edema.  received albumin infusion 10/28/2020. -cardiology consulted: added entresto 1/21 . -per prior MD refusing tele  Elbow pain - possible hairline fracture -s/p fall -unclear timing -xray inconclusive -ice/tylenol/lidocaine patch -appreciate ortho: active ROM w/o immobilization  Wide-Complex Tachycardia/Possible Atypical Atrial Flutter - He had up to 25 beats of NSVT on 10/27/2020 and was hypoglycemic at 25 during that time. He has since refused to wear his telemetry monitor. Not felt to be a candidate for an ICD given his medication noncompliance and noncompliance with visits as an outpatient. He has been started on Toprol-XL 25mg  daily.  - Was already on Eliquis 5mg  BID prior to admission given history of a DVT and will continue with anticoagulation at this time in the setting of possible atrial flutter  Mild  hypoxemia -Patient became more dyspneic with some mild hypoxemia noted on 1/18, no chest pain -Chest x-ray with cardiomegaly and pulmonary vascular congestion, but no frank edema -resolved-- off O2  Hyperglycemia in the setting of type 2 diabetes-improved -Noncompliant with home medications -reportedly, he is ordering food/pizza from outside restaurants intermittently.  This makes it very challenging to manage his diabetes. -increase meal coverage and small increase in lantus for now  Dyslipidemia -Continue Lipitor  CAD with prior stent placement -Continue on aspirin, Eliquis, and Lipitor -Resume home b-blocker  GERD -Famotidine  COPD -No acute bronchospasms currently noted -Continue Dulera -Duo nebs with Xopenex ordered as needed shortness of breath or wheezing  CKD stage IIIa: At baseline -daily labs  incidental COVID 19 infection-- no symptoms  Occasional confusion -? neurontin-- decrease dose  LE wounds on left leg -check ABIs when able  DVT prophylaxis:Eliquis Code Status:Full Disposition Plan: Status is: Inpatient  Remains inpatient appropriate because:IV treatments appropriate due to intensity of illness or inability to take PO and Inpatient level of care appropriate due to severity of illness  Dispo: The patient is from:Home Anticipated d/c is to:SNF when COVID bed available or after 10 days    Consultants:  Cards  Ortho (phone)    Subjective: Thinks he has a Dr appointment today  Objective: Vitals:   11/09/20 0547 11/09/20 0909 11/09/20 1100 11/09/20 1424  BP: 127/81  118/81 129/75  Pulse: 90  90 86  Resp: 18  18 17   Temp: 98.2 F (36.8 C)  97.8 F (36.6 C) 97.8 F (36.6 C)  TempSrc: Oral     SpO2: 98% 95% 94% 97%  Weight:  Height:        Intake/Output Summary (Last 24 hours) at 11/09/2020 1533 Last data filed at 11/09/2020 1455 Gross per 24 hour  Intake 1345 ml  Output 840 ml  Net  505 ml   Filed Weights   11/05/20 0600 11/08/20 0359 11/09/20 0500  Weight: 104.7 kg 99.5 kg 97.6 kg    Examination:   General: Appearance:     Overweight male in no acute distress     Lungs:     respirations unlabored  Heart:    Normal heart rate. Normal rhythm. No murmurs, rubs, or gallops.   MS:   All extremities are intact. Left left dry with multiple wounds  Neurologic:   Awake, alert, pleasant and cooperative- briefly confused to situation but re-directable   Data Reviewed: I have personally reviewed following labs and imaging studies  CBC: Recent Labs  Lab 11/04/20 0445  WBC 7.3  HGB 10.4*  HCT 33.4*  MCV 93.6  PLT [id number]*   Basic Metabolic Panel: Recent Labs  Lab 11/04/20 0445 11/05/20 0422 11/06/20 0807 11/07/20 0500 11/08/20 0306  NA 131* 132* 133* 132* 134*  K 3.4* 3.7 3.2* 3.4* 3.6  CL 88* 88* 89* 87* 90*  CO2 34* 35* 34* 35* 35*  GLUCOSE 101* 125* 135* 295* 164*  BUN 31* 31* 33* 32* 32*  CREATININE 1.43* 1.43* 1.38* 1.42* 1.43*  CALCIUM 8.8* 8.9 8.4* 8.3* 8.2*   GFR: Estimated Creatinine Clearance: 67.9 mL/min (A) (by C-G formula based on SCr of 1.43 mg/dL (H)). Liver Function Tests: No results for input(s): AST, ALT, ALKPHOS, BILITOT, PROT, ALBUMIN in the last 168 hours. No results for input(s): LIPASE, AMYLASE in the last 168 hours. No results for input(s): AMMONIA in the last 168 hours. Coagulation Profile: No results for input(s): INR, PROTIME in the last 168 hours. Cardiac Enzymes: No results for input(s): CKTOTAL, CKMB, CKMBINDEX, TROPONINI in the last 168 hours. BNP (last 3 results) No results for input(s): PROBNP in the last 8760 hours. HbA1C: No results for input(s): HGBA1C in the last 72 hours. CBG: Recent Labs  Lab 11/08/20 1626 11/08/20 2105 11/09/20 0343 11/09/20 0755 11/09/20 1116  GLUCAP 116* 246* 196* 171* 143*   Lipid Profile: No results for input(s): CHOL, HDL, LDLCALC, TRIG, CHOLHDL, LDLDIRECT in the last 72  hours. Thyroid Function Tests: No results for input(s): TSH, T4TOTAL, FREET4, T3FREE, THYROIDAB in the last 72 hours. Anemia Panel: No results for input(s): VITAMINB12, FOLATE, FERRITIN, TIBC, IRON, RETICCTPCT in the last 72 hours. Sepsis Labs: No results for input(s): PROCALCITON, LATICACIDVEN in the last 168 hours.  Recent Results (from the past 240 hour(s))  SARS CORONAVIRUS 2 (TAT 6-24 HRS) Nasopharyngeal Nasopharyngeal Swab     Status: Abnormal   Collection Time: 11/04/20 10:07 AM   Specimen: Nasopharyngeal Swab  Result Value Ref Range Status   SARS Coronavirus 2 POSITIVE (A) NEGATIVE Final    Comment: (NOTE) SARS-CoV-2 target nucleic acids are DETECTED.  The SARS-CoV-2 RNA is generally detectable in upper and lower respiratory specimens during the acute phase of infection. Positive results are indicative of the presence of SARS-CoV-2 RNA. Clinical correlation with patient history and other diagnostic information is  necessary to determine patient infection status. Positive results do not rule out bacterial infection or co-infection with other viruses.  The expected result is Negative.  Fact Sheet for Patients: SugarRoll.be  Fact Sheet for Healthcare Providers: https://www.woods-mathews.com/  This test is not yet approved or cleared by the [place] FDA and  has been authorized for detection and/or diagnosis of SARS-CoV-2 by FDA under an Emergency Use Authorization (EUA). This EUA will remain  in effect (meaning this test can be used) for the duration of the COVID-19 declaration under Section 564(b)(1) of the Act, 21 U. S.C. section 360bbb-3(b)(1), unless the authorization is terminated or revoked sooner.   Performed at [facility], [place] [address] [place], [place] [address]     Radiology Studies: No results found. Scheduled Meds: . apixaban  5 mg Oral BID  . atorvastatin  80 mg Oral Daily  . busPIRone  7.5 mg  Oral BID  . famotidine  20 mg Oral BID  . [START ON 11/10/2020] gabapentin  300 mg Oral QHS  . hydrocerin   Topical BID  . insulin aspart  0-5 Units Subcutaneous QHS  . insulin aspart  0-9 Units Subcutaneous TID WC  . insulin glargine  10 Units Subcutaneous QPM  . lidocaine  1 patch Transdermal Q24H  . melatonin  6 mg Oral QHS  . metoprolol succinate  25 mg Oral Daily  . mometasone-formoterol  2 puff Inhalation BID  . nystatin   Topical BID  . potassium chloride  20 mEq Oral BID  . Ensure Max Protein  11 oz Oral Daily  . sacubitril-valsartan  1 tablet Oral BID  . sodium chloride flush  3 mL Intravenous Q12H  . torsemide  60 mg Oral BID  . traZODone  150 mg Oral QHS   Continuous Infusions: . sodium chloride       LOS: 18 days   Time spent: 28 minutes  Geradine Girt, DO  How to contact the [facility] Attending or Consulting provider [place] or covering provider during after hours [place], for this patient?  1. Check the care team in [facility] and look for a) attending/consulting [facility] provider listed and b) the [facility] team listed 2. Log into [email or web address] and use [place]'s universal password to access. If you do not have the password, please contact the hospital operator. 3. Locate the [facility] provider you are looking for under [facility] and page to a number that you can be directly reached. 4. If you still have difficulty reaching the provider, please page the [facility] (Director on Call) for the Hospitalists listed on amion for assistance.  11/09/2020, 3:33 PM

## 2020-11-10 DIAGNOSIS — I10 Essential (primary) hypertension: Secondary | ICD-10-CM | POA: Diagnosis not present

## 2020-11-10 DIAGNOSIS — E1122 Type 2 diabetes mellitus with diabetic chronic kidney disease: Secondary | ICD-10-CM | POA: Diagnosis not present

## 2020-11-10 DIAGNOSIS — U071 COVID-19: Secondary | ICD-10-CM | POA: Diagnosis not present

## 2020-11-10 DIAGNOSIS — I5043 Acute on chronic combined systolic (congestive) and diastolic (congestive) heart failure: Secondary | ICD-10-CM | POA: Diagnosis not present

## 2020-11-10 LAB — GLUCOSE, CAPILLARY
Glucose-Capillary: 109 mg/dL — ABNORMAL HIGH (ref 70–99)
Glucose-Capillary: 109 mg/dL — ABNORMAL HIGH (ref 70–99)
Glucose-Capillary: 218 mg/dL — ABNORMAL HIGH (ref 70–99)
Glucose-Capillary: 261 mg/dL — ABNORMAL HIGH (ref 70–99)
Glucose-Capillary: 282 mg/dL — ABNORMAL HIGH (ref 70–99)
Glucose-Capillary: 232 mg/dL — ABNORMAL HIGH (ref 70–99)

## 2020-11-10 LAB — BASIC METABOLIC PANEL
Anion gap: 11 (ref 5–15)
BUN: 34 mg/dL — ABNORMAL HIGH (ref 6–20)
CO2: 33 mmol/L — ABNORMAL HIGH (ref 22–32)
Calcium: 8.4 mg/dL — ABNORMAL LOW (ref 8.9–10.3)
Chloride: 91 mmol/L — ABNORMAL LOW (ref 98–111)
Creatinine, Ser: 1.24 mg/dL (ref 0.61–1.24)
GFR, Estimated: >60 mL/min (ref >60)
Glucose, Bld: 106 mg/dL — ABNORMAL HIGH (ref 70–99)
Potassium: 3.5 mmol/L (ref 3.5–5.1)
Sodium: 135 mmol/L (ref 135–145)

## 2020-11-10 NOTE — Progress Notes (Signed)
PROGRESS NOTE  Dustin Cruz:[id number] DOB: Feb 09, 1966 DOA: 10/22/2020 PCP: [name], DO  HPI/Recap of past 24 hours: HPI from Dr Dustin Cruz D Packis a 55 y.o.male admitted with significant volume overload/anasarca in the setting of acute on chronic diastolic CHF exacerbation with poor compliance on home medications.  Cardiology was consulted, patient was adequately diuresed with significant improvement.  Patient was incidentally found to be Covid positive while performing routine testing to discharge to SNF.  Remains asymptomatic.  Plan is to discharge with home health.    Today, patient denying any worsening shortness of breath, denies any chest pain, abdominal pain, nausea/vomiting, fever/chills.  Patient complaining of left lower extremity pain, noted wounds.   Assessment/Plan: Active Problems:   CKD stage 3 due to type 2 diabetes mellitus (HCC)   Essential hypertension, benign   Acute on chronic diastolic (congestive) heart failure (HCC)   COVID   Significant volume overload with anasarca/acute on chronic combined systolic and diastolic HF Improving Last 2D echocardiogram performed on10/16/2021 with LVEF 60-65% and grade 3 diastolic dysfunction, repeated Echo 1/18 with LVEF 35-40% Cardiology consulted, signed off, recommend p.o. torsemide 60 mg twice daily due to worsening kidney function, started Entresto Hypoalbuminemia may also be playing a role in overall edema, received albumin infusion 10/28/2020. Strict I's and O's, daily weights  CAD/hyperlipidemia Chest pain-free Heart cath in 2013, was recommended medical management due to small vessel size Continue Lipitor, Toprol  Wide-Complex Tachycardia/Possible Atypical Atrial Flutter Cardiology consulted, not felt to be a candidate for an ICD given his medication noncompliance and noncompliance with visits as an outpatient Continue Toprol-XL 25mg  daily, Eliquis 5mg  BID (was on prior to admission given history  of a DVT)  Diabetes mellitus type 2 A1c on 09/2020 was 14, uncontrolled Very noncompliant (ordering food/pizza from outside restaurants) Continue SSI, lantus, Accu-Cheks, hypoglycemic protocol  LLE wound Appears to be chronic Daily dressing  CKD stage IIIa Creatinine at baseline Daily BMP while on diuretics  Incidental COVID-19 infection Asymptomatic, needs 10 days of isolation  COPD Stable Continue Dulera, nebs as needed  GERD Famotidine       Malnutrition Type:  Nutrition Problem: Food and nutrition related knowledge deficit Etiology: limited prior education (CHF)   Malnutrition Characteristics:  Signs/Symptoms: per patient/family report   Nutrition Interventions:       Estimated body mass index is 27.85 kg/m as calculated from the following:   Height as of this encounter: 6\' 2"  (1.88 m).   Weight as of this encounter: 98.4 kg.     Code Status: Full  Family Communication: None at bedside  Disposition Plan: Status is: Inpatient  Remains inpatient appropriate because:Inpatient level of care appropriate due to severity of illness   Dispo: The patient is from: Home              Anticipated d/c is to: Home              Anticipated d/c date is: 1 day              Patient currently is medically stable to d/c.   Difficult to place patient Yes   Consultants:  Cardiology  Procedures:  None  Antimicrobials:  None  DVT prophylaxis: Eliquis   Objective: Vitals:   11/10/20 0604 11/10/20 0738 11/10/20 0900 11/10/20 0902  BP: 123/84 (!) 141/103 125/82 125/82  Pulse: 83 90  82  Resp: 16     Temp: 98.4 F (36.9 C) 98.3 F (36.8 C)  TempSrc: Oral Oral    SpO2: 99% 99%    Weight:      Height:        Intake/Output Summary (Last 24 hours) at 11/10/2020 1524 Last data filed at 11/10/2020 1100 Gross per 24 hour  Intake 840 ml  Output --  Net 840 ml   Filed Weights   11/08/20 0359 11/09/20 0500 11/10/20 0500  Weight: 99.5 kg 97.6 kg  98.4 kg    Exam:  General: NAD   Cardiovascular: S1, S2 present  Respiratory: CTAB  Abdomen: Soft, nontender, nondistended, bowel sounds present  Musculoskeletal: Trace bilateral pedal edema noted, LLE with noted wounds, dressing noted  Skin:  As mentioned above  Psychiatry: Normal mood   Data Reviewed: CBC: Recent Labs  Lab 11/04/20 0445  WBC 7.3  HGB 10.4*  HCT 33.4*  MCV 93.6  PLT Q000111Q*   Basic Metabolic Panel: Recent Labs  Lab 11/05/20 0422 11/06/20 0807 11/07/20 0500 11/08/20 0306 11/10/20 0436  NA 132* 133* 132* 134* 135  K 3.7 3.2* 3.4* 3.6 3.5  CL 88* 89* 87* 90* 91*  CO2 35* 34* 35* 35* 33*  GLUCOSE 125* 135* 295* 164* 106*  BUN 31* 33* 32* 32* 34*  CREATININE 1.43* 1.38* 1.42* 1.43* 1.24  CALCIUM 8.9 8.4* 8.3* 8.2* 8.4*   GFR: Estimated Creatinine Clearance: 78.3 mL/min (by C-G formula based on SCr of 1.24 mg/dL). Liver Function Tests: No results for input(s): AST, ALT, ALKPHOS, BILITOT, PROT, ALBUMIN in the last 168 hours. No results for input(s): LIPASE, AMYLASE in the last 168 hours. No results for input(s): AMMONIA in the last 168 hours. Coagulation Profile: No results for input(s): INR, PROTIME in the last 168 hours. Cardiac Enzymes: No results for input(s): CKTOTAL, CKMB, CKMBINDEX, TROPONINI in the last 168 hours. BNP (last 3 results) No results for input(s): PROBNP in the last 8760 hours. HbA1C: No results for input(s): HGBA1C in the last 72 hours. CBG: Recent Labs  Lab 11/09/20 1611 11/09/20 2210 11/10/20 0413 11/10/20 0736 11/10/20 1140  GLUCAP 215* 303* 109* 109* 218*   Lipid Profile: No results for input(s): CHOL, HDL, LDLCALC, TRIG, CHOLHDL, LDLDIRECT in the last 72 hours. Thyroid Function Tests: No results for input(s): TSH, T4TOTAL, FREET4, T3FREE, THYROIDAB in the last 72 hours. Anemia Panel: No results for input(s): VITAMINB12, FOLATE, FERRITIN, TIBC, IRON, RETICCTPCT in the last 72 hours. Urine analysis:     Component Value Date/Time   COLORURINE YELLOW 09/15/2020 2055   APPEARANCEUR CLEAR 09/15/2020 2055   APPEARANCEUR Cloudy (A) 06/10/2019 1549   LABSPEC 1.024 09/15/2020 2055   PHURINE 5.0 09/15/2020 2055   GLUCOSEU >=500 (A) 09/15/2020 2055   HGBUR MODERATE (A) 09/15/2020 2055   BILIRUBINUR NEGATIVE 09/15/2020 2055   BILIRUBINUR Negative 06/10/2019 1549   KETONESUR NEGATIVE 09/15/2020 2055   PROTEINUR >=300 (A) 09/15/2020 2055   UROBILINOGEN 0.2 01/26/2014 1618   NITRITE NEGATIVE 09/15/2020 2055   LEUKOCYTESUR NEGATIVE 09/15/2020 2055   Sepsis Labs: @LABRCNTIP (procalcitonin:4,lacticidven:4)  ) Recent Results (from the past 240 hour(s))  SARS CORONAVIRUS 2 (TAT 6-24 HRS) Nasopharyngeal Nasopharyngeal Swab     Status: Abnormal   Collection Time: 11/04/20 10:07 AM   Specimen: Nasopharyngeal Swab  Result Value Ref Range Status   SARS Coronavirus 2 POSITIVE (A) NEGATIVE Final    Comment: (NOTE) SARS-CoV-2 target nucleic acids are DETECTED.  The SARS-CoV-2 RNA is generally detectable in upper and lower respiratory specimens during the acute phase of infection. Positive results are indicative of the presence of  SARS-CoV-2 RNA. Clinical correlation with patient history and other diagnostic information is  necessary to determine patient infection status. Positive results do not rule out bacterial infection or co-infection with other viruses.  The expected result is Negative.  Fact Sheet for Patients: SugarRoll.be  Fact Sheet for Healthcare Providers: https://www.woods-mathews.com/  This test is not yet approved or cleared by the [place] FDA and  has been authorized for detection and/or diagnosis of SARS-CoV-2 by FDA under an Emergency Use Authorization (EUA). This EUA will remain  in effect (meaning this test can be used) for the duration of the COVID-19 declaration under Section 564(b)(1) of the Act, 21 U. S.C. section  360bbb-3(b)(1), unless the authorization is terminated or revoked sooner.   Performed at [facility], [place] [address] [place], [place] [address]       Studies: No results found.  Scheduled Meds: . apixaban  5 mg Oral BID  . atorvastatin  80 mg Oral Daily  . busPIRone  7.5 mg Oral BID  . famotidine  20 mg Oral BID  . gabapentin  300 mg Oral QHS  . hydrocerin   Topical BID  . insulin aspart  0-5 Units Subcutaneous QHS  . insulin aspart  0-9 Units Subcutaneous TID WC  . insulin glargine  10 Units Subcutaneous QPM  . lidocaine  1 patch Transdermal Q24H  . melatonin  6 mg Oral QHS  . metoprolol succinate  25 mg Oral Daily  . mometasone-formoterol  2 puff Inhalation BID  . nystatin   Topical BID  . potassium chloride  20 mEq Oral BID  . Ensure Max Protein  11 oz Oral Daily  . sacubitril-valsartan  1 tablet Oral BID  . sodium chloride flush  3 mL Intravenous Q12H  . torsemide  60 mg Oral BID  . traZODone  150 mg Oral QHS    Continuous Infusions: . sodium chloride       LOS: 19 days     [place] Friendly, MD [facility]  If 7PM-7AM, please contact night-coverage [email or web address] 11/10/2020, 3:24 PM

## 2020-11-10 NOTE — TOC Progression Note (Signed)
Transition of Care [facility]) - Progression Note    Patient Details  Name: Dustin Cruz MRN: [id number] Date of Birth: 07-11-1966  [facility] CM/SW Contact  Shade Flood, LCSW Phone Number: 11/10/2020, 1:57 PM  Clinical Narrative:     TOC following. Pt still does not have any SNF bed offers. He appears to be improving in PT. TOC spoke with pt to discuss alternative dc plans. Pt stated that prior to this hospitalization, he was staying "here and there." He further states that he had been living with a friend in someone else's house and then the home owner asked them to move out because he didn't want roommates any more. Pt states that his friend got herself an apartment but it's only for one person so he was staying here and there.   Pt reports that he gets his check tomorrow. TOC discussed possible dc to hotel and pt stated that he would either go to hotel or find someone to stay with.   TOC will follow up in AM.   Expected Discharge Plan: [place] Barriers to Discharge: No SNF bed,SNF Pending bed offer  Expected Discharge Plan and Services Expected Discharge Plan: [place] In-house Referral: Clinical Social Work Discharge Planning Services: NA Post Acute Care Choice: NA Living arrangements for the past 2 months: Homeless                 DME Arranged: N/A DME Agency: NA       HH Arranged: NA HH Agency: NA         Social Determinants of Health (SDOH) Interventions    Readmission Risk Interventions Readmission Risk Prevention Plan 10/25/2020 09/29/2020  Transportation Screening Complete Complete  Medication Review Press photographer) Complete Complete  HRI or Home Care Consult Complete Complete  SW Recovery Care/Counseling Consult Complete Complete  Palliative Care Screening Not Applicable Not Applicable  Skilled Nursing Facility Not Applicable Not Complete  Some recent data might be hidden

## 2020-11-10 NOTE — Progress Notes (Signed)
   11/10/20 0902  Vitals  BP 125/82  MAP (mmHg) 94  BP Method Automatic  Pulse Rate 82  MEWS COLOR  MEWS Score Color Green  MEWS Score  MEWS Temp 0  MEWS Systolic 0  MEWS Pulse 0  MEWS RR 0  MEWS LOC 0  MEWS Score 0

## 2020-11-10 NOTE — Progress Notes (Signed)
Patients family member Cassell Smiles called and update. Patient stated to significant other he is leaving tomorrow .  I do not see discharge order, or that SNF has accepted patient. Sent message to Dr. Horris Latino as family member requested and also to [place].

## 2020-11-10 NOTE — Progress Notes (Signed)
Physical Therapy Treatment Patient Details Name: Dustin Cruz MRN: [id number] DOB: 04/19/1966 Today's Date: 11/10/2020    History of Present Illness Dustin Cruz is a 55 y.o. male with medical history significant for CAD, type 2 diabetes, diastolic heart failure, GERD, and COPD who presented to the ED with worsening lower extremity edema as well as abdominal edema and scrotal edema as well as some weakness.  He is currently homeless and lives in his car.  He states that he had run out of his Lasix as well as his diabetes medication for the last few days.  It appears that on last admission on 09/2020 that he left AMA as he was being treated for edema at that time.  He denies any fever, chills, abdominal pain, nausea, vomiting, chest pain, or other concerns.    PT Comments    Patient presents seated at EOB and agreeable for therapy.  Patient instructed in and given written instructions for HEP with understanding acknowledged, fair/good return for using quad-cane during ambulation in room without loss of balance, limited mostly due to fatigue and continued sitting up at bedside after therapy.  Patient is requesting a rollator walker to use for longer distances when he returns home - CM/SW notified.  Patient will benefit from continued physical therapy in hospital and recommended venue below to increase strength, balance, endurance for safe ADLs and gait.    Follow Up Recommendations  Home health PT;Supervision - Intermittent     Equipment Recommendations  Other (comment) (Rollator Walker)    Recommendations for Other Services       Precautions / Restrictions Precautions Precautions: Fall Restrictions Weight Bearing Restrictions: No    Mobility  Bed Mobility Overal bed mobility: Modified Independent                Transfers Overall transfer level: Needs assistance Equipment used: Quad cane Transfers: Sit to/from [facility] Sit to Stand: Modified independent  (Device/Increase time);Supervision Stand pivot transfers: Modified independent (Device/Increase time);Supervision       General transfer comment: required verbal cues for proper positioning of quad-cane with good carryover demonstrated  Ambulation/Gait Ambulation/Gait assistance: Supervision Gait Distance (Feet): 30 Feet Assistive device: Quad cane Gait Pattern/deviations: Step-through pattern;Decreased step length - right;Decreased step length - left;Decreased stride length;Trunk flexed;Wide base of support Gait velocity: decreased.   General Gait Details: slightly labored unsteady cadence without loss of balance, limited mostly due to fatigue   Stairs             Wheelchair Mobility    Modified Rankin (Stroke Patients Only)       Balance Overall balance assessment: Needs assistance Sitting-balance support: Feet supported;No upper extremity supported Sitting balance-Leahy Scale: Good Sitting balance - Comments: seated at EOB   Standing balance support: During functional activity;Single extremity supported Standing balance-Leahy Scale: Fair Standing balance comment: fair/good using quad-cane                            Cognition Arousal/Alertness: Awake/alert Behavior During Therapy: WFL for tasks assessed/performed Overall Cognitive Status: Within Functional Limits for tasks assessed                                        Exercises General Exercises - Lower Extremity Long Arc Quad: Seated;AROM;Strengthening;Both;5 reps Hip Flexion/Marching: Seated;Strengthening;Both;AAROM;5 reps Toe Raises: Seated;AROM;Strengthening;Both;5 reps Heel Raises: Seated;AROM;Strengthening;Both;5 reps  General Comments        Pertinent Vitals/Pain Pain Assessment: No/denies pain    Home Living                      Prior Function            PT Goals (current goals can now be found in the care plan section) Acute Rehab PT  Goals Patient Stated Goal: Get stronger PT Goal Formulation: With patient Time For Goal Achievement: 11/12/20 Potential to Achieve Goals: Good Progress towards PT goals: Progressing toward goals    Frequency    Min 3X/week      PT Plan Current plan remains appropriate    Co-evaluation              AM-PAC PT "6 Clicks" Mobility   Outcome Measure  Help needed turning from your back to your side while in a flat bed without using bedrails?: None Help needed moving from lying on your back to sitting on the side of a flat bed without using bedrails?: None Help needed moving to and from a bed to a chair (including a wheelchair)?: None Help needed standing up from a chair using your arms (e.g., wheelchair or bedside chair)?: None Help needed to walk in hospital room?: A Little Help needed climbing 3-5 steps with a railing? : A Little 6 Click Score: 22    End of Session   Activity Tolerance: Patient tolerated treatment well;Patient limited by fatigue Patient left: in bed;with call bell/phone within reach Nurse Communication: Mobility status PT Visit Diagnosis: Unsteadiness on feet (R26.81);Other abnormalities of gait and mobility (R26.89);Muscle weakness (generalized) (M62.81)     Time: 2956-2130 PT Time Calculation (min) (ACUTE ONLY): 21 min  Charges:  $Therapeutic Exercise: 8-22 mins $Therapeutic Activity: 8-22 mins                     3:22 PM, 11/10/20 Lonell Grandchild, MPT Physical Therapist with [facility] [phone] office [phone] mobile phone

## 2020-11-10 NOTE — Progress Notes (Signed)
   11/10/20 0738  Vitals  Temp 98.3 F (36.8 C)  Temp Source Oral  BP (!) 141/103  MAP (mmHg) 111  BP Method Automatic  Pulse Rate 90  Pulse Rate Source Monitor  MEWS COLOR  MEWS Score Color Green  Oxygen Therapy  SpO2 99 %  O2 Device Room Air  MEWS Score  MEWS Temp 0  MEWS Systolic 0  MEWS Pulse 0  MEWS RR 0  MEWS LOC 0  MEWS Score 0

## 2020-11-10 NOTE — Plan of Care (Signed)

## 2020-11-11 DIAGNOSIS — E1122 Type 2 diabetes mellitus with diabetic chronic kidney disease: Secondary | ICD-10-CM | POA: Diagnosis not present

## 2020-11-11 DIAGNOSIS — I5043 Acute on chronic combined systolic (congestive) and diastolic (congestive) heart failure: Secondary | ICD-10-CM | POA: Diagnosis not present

## 2020-11-11 DIAGNOSIS — I10 Essential (primary) hypertension: Secondary | ICD-10-CM | POA: Diagnosis not present

## 2020-11-11 DIAGNOSIS — U071 COVID-19: Secondary | ICD-10-CM | POA: Diagnosis not present

## 2020-11-11 LAB — BASIC METABOLIC PANEL
Anion gap: 10 (ref 5–15)
BUN: 31 mg/dL — ABNORMAL HIGH (ref 6–20)
CO2: 30 mmol/L (ref 22–32)
Calcium: 8 mg/dL — ABNORMAL LOW (ref 8.9–10.3)
Chloride: 92 mmol/L — ABNORMAL LOW (ref 98–111)
Creatinine, Ser: 1.25 mg/dL — ABNORMAL HIGH (ref 0.61–1.24)
GFR, Estimated: >60 mL/min (ref >60)
Glucose, Bld: 187 mg/dL — ABNORMAL HIGH (ref 70–99)
Potassium: 3.7 mmol/L (ref 3.5–5.1)
Sodium: 132 mmol/L — ABNORMAL LOW (ref 135–145)

## 2020-11-11 LAB — GLUCOSE, CAPILLARY
Glucose-Capillary: 134 mg/dL — ABNORMAL HIGH (ref 70–99)
Glucose-Capillary: 152 mg/dL — ABNORMAL HIGH (ref 70–99)
Glucose-Capillary: 176 mg/dL — ABNORMAL HIGH (ref 70–99)
Glucose-Capillary: 268 mg/dL — ABNORMAL HIGH (ref 70–99)
Glucose-Capillary: 224 mg/dL — ABNORMAL HIGH (ref 70–99)
Glucose-Capillary: 199 mg/dL — ABNORMAL HIGH (ref 70–99)

## 2020-11-11 MED ORDER — INSULIN ASPART 100 UNIT/ML ~~LOC~~ SOLN
3.0000 [IU] | Freq: Three times a day (TID) | SUBCUTANEOUS | Status: DC
  Administered 2020-11-11 – 2020-11-18 (×21): 3 [IU] via SUBCUTANEOUS

## 2020-11-11 NOTE — Progress Notes (Signed)
PROGRESS NOTE  Dustin Cruz [id number] DOB: [birth year] DOA: 10/22/2020 PCP: [name], DO  HPI/Recap of past 24 hours: HPI from Dr Arizona Constable D Packis a 55 y.o.male admitted with significant volume overload/anasarca in the setting of acute on chronic diastolic CHF exacerbation with poor compliance on home medications.  Cardiology was consulted, patient was adequately diuresed with significant improvement.  Patient was incidentally found to be Covid positive while performing routine testing to discharge to SNF.  Remains asymptomatic.  Plan is to discharge to SNF as pt is homeless   Today, pt denies any new complaints   Assessment/Plan: Active Problems:   CKD stage 3 due to type 2 diabetes mellitus ([place])   Essential hypertension, benign   Acute on chronic diastolic (congestive) heart failure (HCC)   COVID   Significant volume overload with anasarca/acute on chronic combined systolic and diastolic HF Improving Last 2D echocardiogram performed on10/16/2021 with LVEF 60-65% and grade 3 diastolic dysfunction, repeated Echo 1/18 with LVEF 35-40% Cardiology consulted, signed off, recommend p.o. torsemide 60 mg twice daily due to worsening kidney function, started Entresto Hypoalbuminemia may also be playing a role in overall edema, received albumin infusion 10/28/2020. Strict I's and O's, daily weights  CAD/hyperlipidemia Chest pain-free Heart cath in 2013, was recommended medical management due to small vessel size Continue Lipitor, Toprol  Wide-Complex Tachycardia/Possible Atypical Atrial Flutter Cardiology consulted, not felt to be a candidate for an ICD given his medication noncompliance and noncompliance with visits as an outpatient Continue Toprol-XL 25mg  daily, Eliquis 5mg  BID (was on prior to admission given history of a DVT)  Diabetes mellitus type 2 A1c on 09/2020 was 14, uncontrolled Very noncompliant (ordering food/pizza from outside restaurants) Continue  SSI, lantus, Accu-Cheks, hypoglycemic protocol  LLE wound Appears to be chronic Daily dressing  CKD stage IIIa Creatinine at baseline Daily BMP while on diuretics  Incidental COVID-19 infection Asymptomatic, needs 10 days of isolation  COPD Stable Continue Dulera, nebs as needed  GERD Famotidine       Malnutrition Type:  Nutrition Problem: Food and nutrition related knowledge deficit Etiology: limited prior education (CHF)   Malnutrition Characteristics:  Signs/Symptoms: per patient/family report   Nutrition Interventions:       Estimated body mass index is 27.85 kg/m as calculated from the following:   Height as of this encounter: 6\' 2"  (1.88 m).   Weight as of this encounter: 98.4 kg.     Code Status: Full  Family Communication: Discussed with significant other on 11/10/20  Disposition Plan: Status is: Inpatient  Remains inpatient appropriate because:Inpatient level of care appropriate due to severity of illness   Dispo: The patient is from: Home (living with friends, homeless)              Anticipated d/c is to: SNF              Anticipated d/c date is: 1 day              Patient currently is medically stable to d/c.   Difficult to place patient Yes   Consultants:  Cardiology  Procedures:  None  Antimicrobials:  None  DVT prophylaxis: Eliquis   Objective: Vitals:   11/10/20 2045 11/11/20 0752 11/11/20 0847 11/11/20 1423  BP:   130/78 106/86  Pulse:   90 85  Resp:    16  Temp:    98.1 F (36.7 C)  TempSrc:    Oral  SpO2: 98% 97%  100%  Weight:  Height:        Intake/Output Summary (Last 24 hours) at 11/11/2020 1824 Last data filed at 11/11/2020 1800 Gross per 24 hour  Intake 1200 ml  Output --  Net 1200 ml   Filed Weights   11/08/20 0359 11/09/20 0500 11/10/20 0500  Weight: 99.5 kg 97.6 kg 98.4 kg    Exam:  General: NAD   Cardiovascular: S1, S2 present  Respiratory: CTAB  Abdomen: Soft, nontender,  nondistended, bowel sounds present  Musculoskeletal: Trace bilateral pedal edema noted, LLE with noted wounds, dressing noted, some erythema present  Skin:  As mentioned above  Psychiatry: Normal mood   Data Reviewed: CBC: No results for input(s): WBC, NEUTROABS, HGB, HCT, MCV, PLT in the last 168 hours. Basic Metabolic Panel: Recent Labs  Lab 11/06/20 0807 11/07/20 0500 11/08/20 0306 11/10/20 0436 11/11/20 0806  NA 133* 132* 134* 135 132*  K 3.2* 3.4* 3.6 3.5 3.7  CL 89* 87* 90* 91* 92*  CO2 34* 35* 35* 33* 30  GLUCOSE 135* 295* 164* 106* 187*  BUN 33* 32* 32* 34* 31*  CREATININE 1.38* 1.42* 1.43* 1.24 1.25*  CALCIUM 8.4* 8.3* 8.2* 8.4* 8.0*   GFR: Estimated Creatinine Clearance: 77.6 mL/min (A) (by C-G formula based on SCr of 1.25 mg/dL (H)). Liver Function Tests: No results for input(s): AST, ALT, ALKPHOS, BILITOT, PROT, ALBUMIN in the last 168 hours. No results for input(s): LIPASE, AMYLASE in the last 168 hours. No results for input(s): AMMONIA in the last 168 hours. Coagulation Profile: No results for input(s): INR, PROTIME in the last 168 hours. Cardiac Enzymes: No results for input(s): CKTOTAL, CKMB, CKMBINDEX, TROPONINI in the last 168 hours. BNP (last 3 results) No results for input(s): PROBNP in the last 8760 hours. HbA1C: No results for input(s): HGBA1C in the last 72 hours. CBG: Recent Labs  Lab 11/11/20 0240 11/11/20 0307 11/11/20 0741 11/11/20 1202 11/11/20 1631  GLUCAP 134* 152* 176* 268* 224*   Lipid Profile: No results for input(s): CHOL, HDL, LDLCALC, TRIG, CHOLHDL, LDLDIRECT in the last 72 hours. Thyroid Function Tests: No results for input(s): TSH, T4TOTAL, FREET4, T3FREE, THYROIDAB in the last 72 hours. Anemia Panel: No results for input(s): VITAMINB12, FOLATE, FERRITIN, TIBC, IRON, RETICCTPCT in the last 72 hours. Urine analysis:    Component Value Date/Time   COLORURINE YELLOW 09/15/2020 2055   APPEARANCEUR CLEAR 09/15/2020 2055    APPEARANCEUR Cloudy (A) 06/10/2019 1549   LABSPEC 1.024 09/15/2020 2055   PHURINE 5.0 09/15/2020 2055   GLUCOSEU >=500 (A) 09/15/2020 2055   HGBUR MODERATE (A) 09/15/2020 2055   BILIRUBINUR NEGATIVE 09/15/2020 2055   BILIRUBINUR Negative 06/10/2019 1549   KETONESUR NEGATIVE 09/15/2020 2055   PROTEINUR >=300 (A) 09/15/2020 2055   UROBILINOGEN 0.2 01/26/2014 1618   NITRITE NEGATIVE 09/15/2020 2055   LEUKOCYTESUR NEGATIVE 09/15/2020 2055   Sepsis Labs: @LABRCNTIP (procalcitonin:4,lacticidven:4)  ) Recent Results (from the past 240 hour(s))  SARS CORONAVIRUS 2 (TAT 6-24 HRS) Nasopharyngeal Nasopharyngeal Swab     Status: Abnormal   Collection Time: 11/04/20 10:07 AM   Specimen: Nasopharyngeal Swab  Result Value Ref Range Status   SARS Coronavirus 2 POSITIVE (A) NEGATIVE Final    Comment: (NOTE) SARS-CoV-2 target nucleic acids are DETECTED.  The SARS-CoV-2 RNA is generally detectable in upper and lower respiratory specimens during the acute phase of infection. Positive results are indicative of the presence of SARS-CoV-2 RNA. Clinical correlation with patient history and other diagnostic information is  necessary to determine patient infection status. Positive results  do not rule out bacterial infection or co-infection with other viruses.  The expected result is Negative.  Fact Sheet for Patients: SugarRoll.be  Fact Sheet for Healthcare Providers: https://www.woods-mathews.com/  This test is not yet approved or cleared by the [place] FDA and  has been authorized for detection and/or diagnosis of SARS-CoV-2 by FDA under an Emergency Use Authorization (EUA). This EUA will remain  in effect (meaning this test can be used) for the duration of the COVID-19 declaration under Section 564(b)(1) of the Act, 21 U. S.C. section 360bbb-3(b)(1), unless the authorization is terminated or revoked sooner.   Performed at [facility] [address], [place], [place] [address]       Studies: No results found.  Scheduled Meds: . apixaban  5 mg Oral BID  . atorvastatin  80 mg Oral Daily  . busPIRone  7.5 mg Oral BID  . famotidine  20 mg Oral BID  . gabapentin  300 mg Oral QHS  . hydrocerin   Topical BID  . insulin aspart  0-5 Units Subcutaneous QHS  . insulin aspart  0-9 Units Subcutaneous TID WC  . insulin aspart  3 Units Subcutaneous TID WC  . insulin glargine  10 Units Subcutaneous QPM  . lidocaine  1 patch Transdermal Q24H  . melatonin  6 mg Oral QHS  . metoprolol succinate  25 mg Oral Daily  . mometasone-formoterol  2 puff Inhalation BID  . nystatin   Topical BID  . potassium chloride  20 mEq Oral BID  . Ensure Max Protein  11 oz Oral Daily  . sacubitril-valsartan  1 tablet Oral BID  . sodium chloride flush  3 mL Intravenous Q12H  . torsemide  60 mg Oral BID  . traZODone  150 mg Oral QHS    Continuous Infusions: . sodium chloride       LOS: 20 days     Alma Friendly, MD [facility]  If 7PM-7AM, please contact night-coverage [email or web address] 11/11/2020, 6:24 PM

## 2020-11-11 NOTE — TOC Progression Note (Signed)
Transition of Care [facility]) - Progression Note    Patient Details  Name: Dustin Cruz MRN: [id number] Date of Birth: 01/20/1966  Transition [facility]) CM/SW Contact  Shade Flood, LCSW Phone Number: 11/11/2020, 3:30 PM  Clinical Narrative:     TOC attempting to find SNF rehab placement as pt's friend expressed significant concern about pt being able to care for himself at dc. [facility] had initially been planning to admit pt but when pt tested positive for Covid on the original day of dc, they could not take him any longer. [facility] will accept patients once they are 14 days past their original positive covid test. TOC still attempting to find another SNF for pt. MD aware.  TOC will follow.  Expected Discharge Plan: Skilled Nursing Facility Barriers to Discharge: No SNF bed,SNF Pending bed offer  Expected Discharge Plan and Services Expected Discharge Plan: [place] In-house Referral: Clinical Social Work Discharge Planning Services: NA Post Acute Care Choice: NA Living arrangements for the past 2 months: Homeless                 DME Arranged: N/A DME Agency: NA       HH Arranged: NA HH Agency: NA         Social Determinants of Health (SDOH) Interventions    Readmission Risk Interventions Readmission Risk Prevention Plan 10/25/2020 09/29/2020  Transportation Screening Complete Complete  Medication Review Press photographer) Complete Complete  HRI or Home Care Consult Complete Complete  SW Recovery Care/Counseling Consult Complete Complete  Palliative Care Screening Not Applicable Not Applicable  Skilled Nursing Facility Not Applicable Not Complete  Some recent data might be hidden

## 2020-11-11 NOTE — Progress Notes (Signed)
Inpatient Diabetes Program Recommendations  AACE/ADA: New Consensus Statement on Inpatient Glycemic Control   Target Ranges:  Prepandial:   less than 140 mg/dL      Peak postprandial:   less than 180 mg/dL (1-2 hours)      Critically ill patients:  140 - 180 mg/dL   Results for JARELL, MCEWEN (MRN [id number]) as of 11/11/2020 09:30  Ref. Range 11/10/2020 07:36 11/10/2020 11:40 11/10/2020 16:47 11/10/2020 17:00 11/10/2020 20:36 11/11/2020 02:40 11/11/2020 03:07 11/11/2020 07:41  Glucose-Capillary Latest Ref Range: 70 - 99 mg/dL 109 (H) 218 (H) 282 (H) 261 (H) 232 (H) 134 (H) 152 (H) 176 (H)   Review of Glycemic Control  Current orders for Inpatient glycemic control: Lantus 10 units QPM, Novolog 0-9 units TID with meals, Novolog 0-5 units QHS  Inpatient Diabetes Program Recommendations:    Insulin: Please consider ordering Novolog 3 units TID with meals for meal coverage if patient eats at least 50% of meals.  Thanks, Barnie Alderman, RN, MSN, CDE Diabetes Coordinator Inpatient Diabetes Program [phone] (Team Pager from 8am to 5pm)

## 2020-11-11 NOTE — Progress Notes (Signed)
Patient called stated he thinks blood glucose is low. CBG 134.

## 2020-11-12 DIAGNOSIS — I10 Essential (primary) hypertension: Secondary | ICD-10-CM | POA: Diagnosis not present

## 2020-11-12 DIAGNOSIS — E1122 Type 2 diabetes mellitus with diabetic chronic kidney disease: Secondary | ICD-10-CM | POA: Diagnosis not present

## 2020-11-12 DIAGNOSIS — I5043 Acute on chronic combined systolic (congestive) and diastolic (congestive) heart failure: Secondary | ICD-10-CM | POA: Diagnosis not present

## 2020-11-12 DIAGNOSIS — U071 COVID-19: Secondary | ICD-10-CM | POA: Diagnosis not present

## 2020-11-12 LAB — BASIC METABOLIC PANEL
Anion gap: 10 (ref 5–15)
BUN: 31 mg/dL — ABNORMAL HIGH (ref 6–20)
CO2: 35 mmol/L — ABNORMAL HIGH (ref 22–32)
Calcium: 8.7 mg/dL — ABNORMAL LOW (ref 8.9–10.3)
Chloride: 91 mmol/L — ABNORMAL LOW (ref 98–111)
Creatinine, Ser: 1.4 mg/dL — ABNORMAL HIGH (ref 0.61–1.24)
GFR, Estimated: 59 mL/min — ABNORMAL LOW (ref >60)
Glucose, Bld: 151 mg/dL — ABNORMAL HIGH (ref 70–99)
Potassium: 3.9 mmol/L (ref 3.5–5.1)
Sodium: 136 mmol/L (ref 135–145)

## 2020-11-12 LAB — GLUCOSE, CAPILLARY
Glucose-Capillary: 155 mg/dL — ABNORMAL HIGH (ref 70–99)
Glucose-Capillary: 164 mg/dL — ABNORMAL HIGH (ref 70–99)
Glucose-Capillary: 192 mg/dL — ABNORMAL HIGH (ref 70–99)
Glucose-Capillary: 177 mg/dL — ABNORMAL HIGH (ref 70–99)
Glucose-Capillary: 274 mg/dL — ABNORMAL HIGH (ref 70–99)

## 2020-11-12 MED ORDER — ENSURE ENLIVE PO LIQD
237.0000 mL | Freq: Every day | ORAL | Status: DC
  Administered 2020-11-13 – 2020-11-18 (×6): 237 mL via ORAL

## 2020-11-12 NOTE — Progress Notes (Addendum)
Pt had a bag full of home medications. Pt showed some to me. Noticed there were meds like gabapentin, oxy ir, and some sleeping meds and muscle relaxers.  Pt surrendered meds to me after quite a bit of pleading.  Educated pt on reason for securing medications. Will need reinforcement. Charge nurse notified, MD sent secure chat. All medications bagged and secured in pharmacy with tags placed on chart and remiinder note on SBAR.

## 2020-11-12 NOTE — Plan of Care (Signed)

## 2020-11-12 NOTE — Progress Notes (Signed)
Pt significant Dustin Cruz other called stating she was the patient's healthcare Power of attorney and she would like the provider to call her 2/5 d/t her concern for his increased confusion.

## 2020-11-12 NOTE — Progress Notes (Signed)
Pt walking in hallway with cane unmasked, confused with jacket on and bag in hand. States he is going to the store for shoes to go home. Redirected pt to room and reoriented to time and need to stay in room.

## 2020-11-12 NOTE — TOC Progression Note (Signed)
Transition of Care [facility]) - Progression Note    Patient Details  Name: Dustin Cruz MRN: [id number] Date of Birth: Jul 29, 1966  Transition of Care [facility]) CM/SW Contact  Natasha Bence, LCSW Phone Number: 11/12/2020, 4:40 PM  Clinical Narrative:    CSW contacted [facility] to inquire when patient could be discharge to facility. Debbie with [name] reported that they would not be able to take patient until they were 14 days post Covid diagnosis. CSW contacted [facility] who reported they declined patient. CSW contacted [facility]. CSW did not receive a response for facilities. CSW notified supervisor. Supervisor reported that SNF's were having trouble accessing the hub to review patient. TOC to follow.   Expected Discharge Plan: Skilled Nursing Facility Barriers to Discharge: No SNF bed,SNF Pending bed offer  Expected Discharge Plan and Services Expected Discharge Plan: [place] In-house Referral: Clinical Social Work Discharge Planning Services: NA Post Acute Care Choice: NA Living arrangements for the past 2 months: Homeless                 DME Arranged: N/A DME Agency: NA       HH Arranged: NA HH Agency: NA         Social Determinants of Health (SDOH) Interventions    Readmission Risk Interventions Readmission Risk Prevention Plan 10/25/2020 09/29/2020  Transportation Screening Complete Complete  Medication Review Press photographer) Complete Complete  HRI or Home Care Consult Complete Complete  SW Recovery Care/Counseling Consult Complete Complete  Palliative Care Screening Not Applicable Not Applicable  Skilled Nursing Facility Not Applicable Not Complete  Some recent data might be hidden

## 2020-11-12 NOTE — Progress Notes (Signed)
PROGRESS NOTE  Dustin Cruz [id number] DOB: October 06, 1966 DOA: 10/22/2020 PCP: [name], DO  HPI/Recap of past 24 hours: HPI from Dr [place] Constable D Packis a 55 y.o.male admitted with significant volume overload/anasarca in the setting of acute on chronic diastolic CHF exacerbation with poor compliance on home medications.  Cardiology was consulted, patient was adequately diuresed with significant improvement.  Patient was incidentally found to be Covid positive while performing routine testing to discharge to SNF.  Remains asymptomatic.  Plan is to discharge to SNF as pt is homeless   Today patient denies any new complaints.  Noted to be possibly self-medicating with his own prescription found in his possession.  Noted oxy, gabapentin, muscle relaxer, sleeping pills.. May be contributing to his intermittent confusion   Assessment/Plan: Active Problems:   CKD stage 3 due to type 2 diabetes mellitus (HCC)   Essential hypertension, benign   Acute on chronic diastolic (congestive) heart failure (HCC)   COVID   Significant volume overload with anasarca/acute on chronic combined systolic and diastolic HF Improving Last 2D echocardiogram performed on10/16/2021 with LVEF 60-65% and grade 3 diastolic dysfunction, repeated Echo 1/18 with LVEF 35-40% Cardiology consulted, signed off, recommend p.o. torsemide 60 mg twice daily due to worsening kidney function, started Entresto Hypoalbuminemia may also be playing a role in overall edema, received albumin infusion 10/28/2020. Strict I's and O's, daily weights  CAD/hyperlipidemia Chest pain-free Heart cath in 2013, was recommended medical management due to small vessel size Continue Lipitor, Toprol  Wide-Complex Tachycardia/Possible Atypical Atrial Flutter Cardiology consulted, not felt to be a candidate for an ICD given his medication noncompliance and noncompliance with visits as an outpatient Continue Toprol-XL 25mg  daily, Eliquis  5mg  BID (was on prior to admission given history of a DVT)  Diabetes mellitus type 2 A1c on 09/2020 was 14, uncontrolled Very noncompliant (ordering food/pizza from outside restaurants) Continue SSI, lantus, Accu-Cheks, hypoglycemic protocol  LLE wound Appears to be chronic Daily dressing  CKD stage IIIa Creatinine at baseline Daily BMP while on diuretics  Incidental COVID-19 infection Asymptomatic, needs 10 days of isolation  COPD Stable Continue Dulera, nebs as needed  GERD Famotidine       Malnutrition Type:  Nutrition Problem: Food and nutrition related knowledge deficit Etiology: limited prior education (CHF)   Malnutrition Characteristics:  Signs/Symptoms: per patient/family report   Nutrition Interventions:       Estimated body mass index is 27.77 kg/m as calculated from the following:   Height as of this encounter: 6\' 2"  (1.88 m).   Weight as of this encounter: 98.1 kg.     Code Status: Full  Family Communication: Discussed with significant other on 11/10/20  Disposition Plan: Status is: Inpatient  Remains inpatient appropriate because:Inpatient level of care appropriate due to severity of illness   Dispo: The patient is from: Home (living with friends, homeless)              Anticipated d/c is to: SNF              Anticipated d/c date is: 1 day              Patient currently is medically stable to d/c.   Difficult to place patient Yes   Consultants:  Cardiology  Procedures:  None  Antimicrobials:  None  DVT prophylaxis: Eliquis   Objective: Vitals:   11/11/20 2102 11/12/20 0547 11/12/20 0839 11/12/20 1400  BP:  114/74  98/75  Pulse:  (!) 54  68  Resp:  18  16  Temp:  98.1 F (36.7 C)  97.7 F (36.5 C)  TempSrc:  Oral  Axillary  SpO2: 100% 98% 95% 96%  Weight:  98.1 kg    Height:        Intake/Output Summary (Last 24 hours) at 11/12/2020 1822 Last data filed at 11/12/2020 0500 Gross per 24 hour  Intake 240 ml   Output --  Net 240 ml   Filed Weights   11/09/20 0500 11/10/20 0500 11/12/20 0547  Weight: 97.6 kg 98.4 kg 98.1 kg    Exam:  General: NAD   Cardiovascular: S1, S2 present  Respiratory: CTAB  Abdomen: Soft, nontender, nondistended, bowel sounds present  Musculoskeletal: Trace bilateral pedal edema noted, LLE with noted wounds, dressing noted, some erythema present  Skin:  As mentioned above  Psychiatry: Normal mood   Data Reviewed: CBC: No results for input(s): WBC, NEUTROABS, HGB, HCT, MCV, PLT in the last 168 hours. Basic Metabolic Panel: Recent Labs  Lab 11/07/20 0500 11/08/20 0306 11/10/20 0436 11/11/20 0806 11/12/20 0315  NA 132* 134* 135 132* 136  K 3.4* 3.6 3.5 3.7 3.9  CL 87* 90* 91* 92* 91*  CO2 35* 35* 33* 30 35*  GLUCOSE 295* 164* 106* 187* 151*  BUN 32* 32* 34* 31* 31*  CREATININE 1.42* 1.43* 1.24 1.25* 1.40*  CALCIUM 8.3* 8.2* 8.4* 8.0* 8.7*   GFR: Estimated Creatinine Clearance: 69.3 mL/min (A) (by C-G formula based on SCr of 1.4 mg/dL (H)). Liver Function Tests: No results for input(s): AST, ALT, ALKPHOS, BILITOT, PROT, ALBUMIN in the last 168 hours. No results for input(s): LIPASE, AMYLASE in the last 168 hours. No results for input(s): AMMONIA in the last 168 hours. Coagulation Profile: No results for input(s): INR, PROTIME in the last 168 hours. Cardiac Enzymes: No results for input(s): CKTOTAL, CKMB, CKMBINDEX, TROPONINI in the last 168 hours. BNP (last 3 results) No results for input(s): PROBNP in the last 8760 hours. HbA1C: No results for input(s): HGBA1C in the last 72 hours. CBG: Recent Labs  Lab 11/11/20 2105 11/12/20 0453 11/12/20 0812 11/12/20 1143 11/12/20 1612  GLUCAP 199* 155* 164* 192* 177*   Lipid Profile: No results for input(s): CHOL, HDL, LDLCALC, TRIG, CHOLHDL, LDLDIRECT in the last 72 hours. Thyroid Function Tests: No results for input(s): TSH, T4TOTAL, FREET4, T3FREE, THYROIDAB in the last 72 hours. Anemia  Panel: No results for input(s): VITAMINB12, FOLATE, FERRITIN, TIBC, IRON, RETICCTPCT in the last 72 hours. Urine analysis:    Component Value Date/Time   COLORURINE YELLOW 09/15/2020 2055   APPEARANCEUR CLEAR 09/15/2020 2055   APPEARANCEUR Cloudy (A) 06/10/2019 1549   LABSPEC 1.024 09/15/2020 2055   PHURINE 5.0 09/15/2020 2055   GLUCOSEU >=500 (A) 09/15/2020 2055   HGBUR MODERATE (A) 09/15/2020 2055   BILIRUBINUR NEGATIVE 09/15/2020 2055   BILIRUBINUR Negative 06/10/2019 1549   KETONESUR NEGATIVE 09/15/2020 2055   PROTEINUR >=300 (A) 09/15/2020 2055   UROBILINOGEN 0.2 01/26/2014 1618   NITRITE NEGATIVE 09/15/2020 2055   LEUKOCYTESUR NEGATIVE 09/15/2020 2055   Sepsis Labs: @LABRCNTIP (procalcitonin:4,lacticidven:4)  ) Recent Results (from the past 240 hour(s))  SARS CORONAVIRUS 2 (TAT 6-24 HRS) Nasopharyngeal Nasopharyngeal Swab     Status: Abnormal   Collection Time: 11/04/20 10:07 AM   Specimen: Nasopharyngeal Swab  Result Value Ref Range Status   SARS Coronavirus 2 POSITIVE (A) NEGATIVE Final    Comment: (NOTE) SARS-CoV-2 target nucleic acids are DETECTED.  The SARS-CoV-2 RNA is generally detectable in upper  and lower respiratory specimens during the acute phase of infection. Positive results are indicative of the presence of SARS-CoV-2 RNA. Clinical correlation with patient history and other diagnostic information is  necessary to determine patient infection status. Positive results do not rule out bacterial infection or co-infection with other viruses.  The expected result is Negative.  Fact Sheet for Patients: SugarRoll.be  Fact Sheet for Healthcare Providers: https://www.woods-mathews.com/  This test is not yet approved or cleared by the [place] FDA and  has been authorized for detection and/or diagnosis of SARS-CoV-2 by FDA under an Emergency Use Authorization (EUA). This EUA will remain  in effect (meaning this  test can be used) for the duration of the COVID-19 declaration under Section 564(b)(1) of the Act, 21 U. S.C. section 360bbb-3(b)(1), unless the authorization is terminated or revoked sooner.   Performed at [facility], [place] [address] [place], [place] [address]       Studies: No results found.  Scheduled Meds: . apixaban  5 mg Oral BID  . atorvastatin  80 mg Oral Daily  . busPIRone  7.5 mg Oral BID  . famotidine  20 mg Oral BID  . feeding supplement  237 mL Oral Q breakfast  . gabapentin  300 mg Oral QHS  . hydrocerin   Topical BID  . insulin aspart  0-5 Units Subcutaneous QHS  . insulin aspart  0-9 Units Subcutaneous TID WC  . insulin aspart  3 Units Subcutaneous TID WC  . insulin glargine  10 Units Subcutaneous QPM  . lidocaine  1 patch Transdermal Q24H  . melatonin  6 mg Oral QHS  . metoprolol succinate  25 mg Oral Daily  . mometasone-formoterol  2 puff Inhalation BID  . nystatin   Topical BID  . potassium chloride  20 mEq Oral BID  . sacubitril-valsartan  1 tablet Oral BID  . sodium chloride flush  3 mL Intravenous Q12H  . torsemide  60 mg Oral BID  . traZODone  150 mg Oral QHS    Continuous Infusions: . sodium chloride       LOS: 21 days     Alma Friendly, MD [facility]  If 7PM-7AM, please contact night-coverage [email or web address] 11/12/2020, 6:22 PM

## 2020-11-13 DIAGNOSIS — I5033 Acute on chronic diastolic (congestive) heart failure: Secondary | ICD-10-CM | POA: Diagnosis not present

## 2020-11-13 DIAGNOSIS — L97921 Non-pressure chronic ulcer of unspecified part of left lower leg limited to breakdown of skin: Secondary | ICD-10-CM

## 2020-11-13 DIAGNOSIS — E1122 Type 2 diabetes mellitus with diabetic chronic kidney disease: Secondary | ICD-10-CM | POA: Diagnosis not present

## 2020-11-13 DIAGNOSIS — I10 Essential (primary) hypertension: Secondary | ICD-10-CM | POA: Diagnosis not present

## 2020-11-13 DIAGNOSIS — R601 Generalized edema: Secondary | ICD-10-CM | POA: Diagnosis not present

## 2020-11-13 LAB — GLUCOSE, CAPILLARY
Glucose-Capillary: 70 mg/dL (ref 70–99)
Glucose-Capillary: 196 mg/dL — ABNORMAL HIGH (ref 70–99)
Glucose-Capillary: 156 mg/dL — ABNORMAL HIGH (ref 70–99)
Glucose-Capillary: 112 mg/dL — ABNORMAL HIGH (ref 70–99)

## 2020-11-13 LAB — BASIC METABOLIC PANEL
Anion gap: 11 (ref 5–15)
BUN: 32 mg/dL — ABNORMAL HIGH (ref 6–20)
CO2: 32 mmol/L (ref 22–32)
Calcium: 8.5 mg/dL — ABNORMAL LOW (ref 8.9–10.3)
Chloride: 92 mmol/L — ABNORMAL LOW (ref 98–111)
Creatinine, Ser: 1.31 mg/dL — ABNORMAL HIGH (ref 0.61–1.24)
GFR, Estimated: >60 mL/min (ref >60)
Glucose, Bld: 77 mg/dL (ref 70–99)
Potassium: 4.4 mmol/L (ref 3.5–5.1)
Sodium: 135 mmol/L (ref 135–145)

## 2020-11-13 MED ORDER — GLUCERNA SHAKE PO LIQD
237.0000 mL | ORAL | Status: DC
  Administered 2020-11-13 – 2020-11-16 (×3): 237 mL via ORAL

## 2020-11-13 NOTE — Plan of Care (Signed)

## 2020-11-13 NOTE — Progress Notes (Signed)
PROGRESS NOTE  Dustin Cruz WEX:[id number] DOB: August 15, 1966 DOA: 10/22/2020 PCP: [name], DO  HPI/Recap of past 24 hours: HPI from Dr [place] Constable D Packis a 55 y.o.male admitted with significant volume overload/anasarca in the setting of acute on chronic diastolic CHF exacerbation with poor compliance on home medications.  Cardiology was consulted, patient was adequately diuresed with significant improvement.  Patient was incidentally found to be Covid positive while performing routine testing to discharge to SNF.  Remains asymptomatic.  Plan is to discharge to SNF as pt is homeless   Today patient denies any new complaints.  Noted to be possibly self-medicating with his own prescription found in his possession.  Noted oxy, gabapentin, muscle relaxer, sleeping pills.. May be contributing to his intermittent confusion   Assessment/Plan: Active Problems:   CKD stage 3 due to type 2 diabetes mellitus (HCC)   Essential hypertension, benign   Acute on chronic diastolic (congestive) heart failure (HCC)   COVID   Significant volume overload with anasarca/acute on chronic combined systolic and diastolic HF -Improved/stable -Last 2D echocardiogram performed on10/16/2021 with LVEF 60-65% and grade 3 diastolic dysfunction, repeated Echo 1/18 with LVEF 35-40% -Cardiology consulted, signed off, recommend p.o. torsemide 60 mg twice daily due to worsening kidney function, started Entresto -Hypoalbuminemia may also be playing a role in overall edema, received albumin infusion 10/28/2020. Will start Glucerna daily. -Strict I's and O's, daily weights  CAD/hyperlipidemia -Currently denies shortness of breath and is chest pain-free -Heart cath in 2013, was recommended medical management due to small vessel size -Continue Lipitor, Toprol  Wide-Complex Tachycardia/Possible Atypical Atrial Flutter -Cardiology consulted, not felt to be a candidate for an ICD given his medication noncompliance  and noncompliance with visits as an outpatient. -Continue Toprol-XL 25mg  daily, Eliquis 5mg  BID (was on prior to admission given history of a DVT). -Patient denies palpitations; noncompliant with telemetry in house.  Diabetes mellitus type 2 -A1c on 09/2020 was 14, uncontrolled -Very noncompliant (ordering food/pizza from outside restaurants) -Continue SSI, lantus, Accu-Cheks, hypoglycemic protocol; based on CBGs fluctuation will follow adjust hypoglycemic regimen.  LLE wound -Appears to be chronic -Daily dressing and wound care.  CKD stage IIIa -Creatinine at baseline -Continue to closely follow renal function trending up actively diuresing patient.  Incidental COVID-19 infection -Asymptomatic, needs 10 days of isolation -Continue supportive care.  COPD -Stable currently no wheezing. -Continue Dulera, and as needed rescue inhalers.    GERD -Continue famotidine     Malnutrition Type:  Nutrition Problem: Food and nutrition related knowledge deficit Etiology: limited prior education (CHF)   Malnutrition Characteristics:  Signs/Symptoms: per patient/family report       Estimated body mass index is 27.8 kg/m as calculated from the following:   Height as of this encounter: 6\' 2"  (1.88 m).   Weight as of this encounter: 98.2 kg.     Code Status: Full  Family Communication: Discussed with significant other on 11/10/20  Disposition Plan: Status is: Inpatient  Remains inpatient appropriate because:Inpatient level of care appropriate due to severity of illness   Dispo: The patient is from: Home (living with friends, homeless)              Anticipated d/c is to: SNF              Anticipated d/c date is: 1 day              Patient currently is medically stable to d/c.   Difficult to place patient Yes  Consultants:  Cardiology  Procedures:  None  Antimicrobials:  None  DVT prophylaxis: Eliquis   Objective: Vitals:   11/13/20 0442 11/13/20 0612  11/13/20 0930 11/13/20 1349  BP: 112/74   116/76  Pulse: 91   89  Resp: 18   16  Temp: 98 F (36.7 C)   97.8 F (36.6 C)  TempSrc: Oral   Oral  SpO2: 100%  95% 98%  Weight:  98.2 kg    Height:        Intake/Output Summary (Last 24 hours) at 11/13/2020 1733 Last data filed at 11/13/2020 1400 Gross per 24 hour  Intake 840 ml  Output 6 ml  Net 834 ml   Filed Weights   11/10/20 0500 11/12/20 0547 11/13/20 0612  Weight: 98.4 kg 98.1 kg 98.2 kg    Exam: General exam: Alert, awake, oriented x2; following commands and not demonstrating acute agitation currently.  Patient is afebrile.  No chest pain, no nausea, no vomiting.  Reports good urine output. Respiratory system: No wheezing, no frank crackles; no using accessory muscle.  Good O2 sat on room air. Cardiovascular system:RRR.  No rubs, no gallops, no JVD. Gastrointestinal system: Abdomen is nondistended, soft and nontender. No organomegaly or masses felt. Normal bowel sounds heard. Central nervous system: Alert and oriented. No focal neurological deficits. Extremities: No cyanosis and clubbing; trace to 1+ edema appreciated bilaterally, chronic excessive dermatitis changes seen.  Lateral dorsal aspect of his left foot with no purulent wound.  There is also wound in the back of his left calf.  No signs of superinfection appreciated. Skin: No petechiae. Psychiatry: Stable mood currently.   Data Reviewed: CBC: No results for input(s): WBC, NEUTROABS, HGB, HCT, MCV, PLT in the last 168 hours. Basic Metabolic Panel: Recent Labs  Lab 11/08/20 0306 11/10/20 0436 11/11/20 0806 11/12/20 0315 11/13/20 0648  NA 134* 135 132* 136 135  K 3.6 3.5 3.7 3.9 4.4  CL 90* 91* 92* 91* 92*  CO2 35* 33* 30 35* 32  GLUCOSE 164* 106* 187* 151* 77  BUN 32* 34* 31* 31* 32*  CREATININE 1.43* 1.24 1.25* 1.40* 1.31*  CALCIUM 8.2* 8.4* 8.0* 8.7* 8.5*   GFR: Estimated Creatinine Clearance: 74.1 mL/min (A) (by C-G formula based on SCr of 1.31 mg/dL  (H)).  CBG: Recent Labs  Lab 11/12/20 1143 11/12/20 1612 11/12/20 2125 11/13/20 0726 11/13/20 1121  GLUCAP 192* 177* 274* 70 196*   Urine analysis:    Component Value Date/Time   COLORURINE YELLOW 09/15/2020 2055   APPEARANCEUR CLEAR 09/15/2020 2055   APPEARANCEUR Cloudy (A) 06/10/2019 1549   LABSPEC 1.024 09/15/2020 2055   PHURINE 5.0 09/15/2020 2055   GLUCOSEU >=500 (A) 09/15/2020 2055   HGBUR MODERATE (A) 09/15/2020 2055   BILIRUBINUR NEGATIVE 09/15/2020 2055   BILIRUBINUR Negative 06/10/2019 1549   KETONESUR NEGATIVE 09/15/2020 2055   PROTEINUR >=300 (A) 09/15/2020 2055   UROBILINOGEN 0.2 01/26/2014 1618   NITRITE NEGATIVE 09/15/2020 2055   LEUKOCYTESUR NEGATIVE 09/15/2020 2055    Recent Results (from the past 240 hour(s))  SARS CORONAVIRUS 2 (TAT 6-24 HRS) Nasopharyngeal Nasopharyngeal Swab     Status: Abnormal   Collection Time: 11/04/20 10:07 AM   Specimen: Nasopharyngeal Swab  Result Value Ref Range Status   SARS Coronavirus 2 POSITIVE (A) NEGATIVE Final    Comment: (NOTE) SARS-CoV-2 target nucleic acids are DETECTED.  The SARS-CoV-2 RNA is generally detectable in upper and lower respiratory specimens during the acute phase of infection. Positive results are  indicative of the presence of SARS-CoV-2 RNA. Clinical correlation with patient history and other diagnostic information is  necessary to determine patient infection status. Positive results do not rule out bacterial infection or co-infection with other viruses.  The expected result is Negative.  Fact Sheet for Patients: SugarRoll.be  Fact Sheet for Healthcare Providers: https://www.woods-mathews.com/  This test is not yet approved or cleared by the [place] FDA and  has been authorized for detection and/or diagnosis of SARS-CoV-2 by FDA under an Emergency Use Authorization (EUA). This EUA will remain  in effect (meaning this test can be used) for the  duration of the COVID-19 declaration under Section 564(b)(1) of the Act, 21 U. S.C. section 360bbb-3(b)(1), unless the authorization is terminated or revoked sooner.   Performed at [facility], [place] [address] [place], [place] [address]       Studies: No results found.  Scheduled Meds: . apixaban  5 mg Oral BID  . atorvastatin  80 mg Oral Daily  . busPIRone  7.5 mg Oral BID  . famotidine  20 mg Oral BID  . feeding supplement  237 mL Oral Q breakfast  . gabapentin  300 mg Oral QHS  . hydrocerin   Topical BID  . insulin aspart  0-5 Units Subcutaneous QHS  . insulin aspart  0-9 Units Subcutaneous TID WC  . insulin aspart  3 Units Subcutaneous TID WC  . insulin glargine  10 Units Subcutaneous QPM  . lidocaine  1 patch Transdermal Q24H  . melatonin  6 mg Oral QHS  . metoprolol succinate  25 mg Oral Daily  . mometasone-formoterol  2 puff Inhalation BID  . nystatin   Topical BID  . potassium chloride  20 mEq Oral BID  . sacubitril-valsartan  1 tablet Oral BID  . sodium chloride flush  3 mL Intravenous Q12H  . torsemide  60 mg Oral BID  . traZODone  150 mg Oral QHS    Continuous Infusions: . sodium chloride       LOS: 22 days     Barton Dubois, MD [facility]  If 7PM-7AM, please contact night-coverage [email or web address] 11/13/2020, 5:33 PM

## 2020-11-13 NOTE — Progress Notes (Signed)
Went into patient room due to hearing him yelling. Pt states he some how pulled out his IV and was bleeding to death. Pt arm bandage and bleeding stopped, room cleaned and pt cleaned. Pt is currently resting. Will continue to monitor.

## 2020-11-14 DIAGNOSIS — I5033 Acute on chronic diastolic (congestive) heart failure: Secondary | ICD-10-CM | POA: Diagnosis not present

## 2020-11-14 DIAGNOSIS — E1122 Type 2 diabetes mellitus with diabetic chronic kidney disease: Secondary | ICD-10-CM | POA: Diagnosis not present

## 2020-11-14 DIAGNOSIS — R601 Generalized edema: Secondary | ICD-10-CM | POA: Diagnosis not present

## 2020-11-14 DIAGNOSIS — I10 Essential (primary) hypertension: Secondary | ICD-10-CM | POA: Diagnosis not present

## 2020-11-14 LAB — BASIC METABOLIC PANEL
Anion gap: 11 (ref 5–15)
BUN: 38 mg/dL — ABNORMAL HIGH (ref 6–20)
CO2: 31 mmol/L (ref 22–32)
Calcium: 8.6 mg/dL — ABNORMAL LOW (ref 8.9–10.3)
Chloride: 91 mmol/L — ABNORMAL LOW (ref 98–111)
Creatinine, Ser: 1.53 mg/dL — ABNORMAL HIGH (ref 0.61–1.24)
GFR, Estimated: 53 mL/min — ABNORMAL LOW (ref >60)
Glucose, Bld: 181 mg/dL — ABNORMAL HIGH (ref 70–99)
Potassium: 4.7 mmol/L (ref 3.5–5.1)
Sodium: 133 mmol/L — ABNORMAL LOW (ref 135–145)

## 2020-11-14 LAB — GLUCOSE, CAPILLARY
Glucose-Capillary: 145 mg/dL — ABNORMAL HIGH (ref 70–99)
Glucose-Capillary: 150 mg/dL — ABNORMAL HIGH (ref 70–99)
Glucose-Capillary: 219 mg/dL — ABNORMAL HIGH (ref 70–99)
Glucose-Capillary: 84 mg/dL (ref 70–99)
Glucose-Capillary: 151 mg/dL — ABNORMAL HIGH (ref 70–99)

## 2020-11-14 NOTE — TOC Progression Note (Addendum)
Transition of Care [facility]: [facility]) - Progression Note    Patient Details  Name: BURWELL BETHEL MRN: [id number] Date of Birth: 1966-08-03  Transition of Care [facility]) CM/SW Contact  Natasha Bence, LCSW Phone Number: 11/14/2020, 1:47 PM  Clinical Narrative:    CSW contacted patient's spouse to discuss places patient's could discharge to. CSW received no answer and LVM. CSW contacted SNF admissions from initial referrals and from [facility] management for Covid Bed availability. CSW LVM with Jo[facility] and [facility]. [place] stated that that the voicemail was full. CSW was able to speak with  LaSha Hickman from admissions with [facility] who reported that their were no beds available at the time, but would be able to review patient once bed became available during the week. CSW contacted [facility] to inquire about hotel Isolation provided for Covid patients. CSW received no answer and LVM. [facility] reported that patient is to late in his Covid diagnosis to be considered for Covid bed placement, but would be considered out of his isolation period for a regular bed. [facility] also reported that patient has been declined even once patient is out of isolation period. TOC to follow.   Expected Discharge Plan: Skilled Nursing Facility Barriers to Discharge: No SNF bed,SNF Pending bed offer  Expected Discharge Plan and Services Expected Discharge Plan: [place] In-house Referral: Clinical Social Work Discharge Planning Services: NA Post Acute Care Choice: NA Living arrangements for the past 2 months: Homeless                 DME Arranged: N/A DME Agency: NA       HH Arranged: NA HH Agency: NA         Social Determinants of Health (SDOH) Interventions    Readmission Risk Interventions Readmission Risk Prevention Plan 10/25/2020 09/29/2020  Transportation Screening Complete Complete  Medication Review Press photographer) Complete Complete  HRI or  Home Care Consult Complete Complete  SW Recovery Care/Counseling Consult Complete Complete  Palliative Care Screening Not Applicable Not Applicable  Skilled Nursing Facility Not Applicable Not Complete  Some recent data might be hidden

## 2020-11-14 NOTE — Plan of Care (Signed)
  Problem: Education: Goal: Knowledge of General Education information will improve Description: Including pain rating scale, medication(s)/side effects and non-pharmacologic comfort measures 11/14/2020 0922 by Adam Phenix, RN Outcome: Adequate for Discharge 11/14/2020 5462 by Adam Phenix, RN Outcome: Adequate for Discharge   Problem: Health Behavior/Discharge Planning: Goal: Ability to manage health-related needs will improve Outcome: Adequate for Discharge   Problem: Clinical Measurements: Goal: Ability to maintain clinical measurements within normal limits will improve Outcome: Adequate for Discharge Goal: Will remain free from infection Outcome: Adequate for Discharge Goal: Diagnostic test results will improve Outcome: Adequate for Discharge Goal: Respiratory complications will improve Outcome: Adequate for Discharge Goal: Cardiovascular complication will be avoided Outcome: Adequate for Discharge   Problem: Activity: Goal: Risk for activity intolerance will decrease Outcome: Adequate for Discharge   Problem: Nutrition: Goal: Adequate nutrition will be maintained Outcome: Adequate for Discharge   Problem: Coping: Goal: Level of anxiety will decrease Outcome: Adequate for Discharge   Problem: Elimination: Goal: Will not experience complications related to bowel motility Outcome: Adequate for Discharge Goal: Will not experience complications related to urinary retention Outcome: Adequate for Discharge   Problem: Pain Managment: Goal: General experience of comfort will improve Outcome: Adequate for Discharge   Problem: Safety: Goal: Ability to remain free from injury will improve Outcome: Adequate for Discharge   Problem: Skin Integrity: Goal: Risk for impaired skin integrity will decrease Outcome: Adequate for Discharge   Problem: Education: Goal: Ability to demonstrate management of disease process will improve Outcome: Adequate for  Discharge Goal: Ability to verbalize understanding of medication therapies will improve Outcome: Adequate for Discharge Goal: Individualized Educational Video(s) Outcome: Adequate for Discharge   Problem: Activity: Goal: Capacity to carry out activities will improve Outcome: Adequate for Discharge   Problem: Cardiac: Goal: Ability to achieve and maintain adequate cardiopulmonary perfusion will improve Outcome: Adequate for Discharge

## 2020-11-14 NOTE — Progress Notes (Signed)
   11/14/20 1017  What Happened  Was fall witnessed? No  Was patient injured? No  Patient found on floor  Found by Staff-comment  Stated prior activity bathroom-unassisted  Follow Up  MD notified Dyann Kief-  Time MD notified 1023  Simple treatment Dressing  Progress note created (see row info) Yes  Adult Fall Risk Assessment  Risk Factor Category (scoring not indicated) High fall risk per protocol (document High fall risk)  Patient Fall Risk Level High fall risk  Adult Fall Risk Interventions  Required Bundle Interventions *See Row Information* High fall risk - low, moderate, and high requirements implemented  Additional Interventions Room near nurses station (pt refuses other interventions, refuses help to bathroom)  Screening for Fall Injury Risk (To be completed on HIGH fall risk patients) - Assessing Need for Low Bed  Risk For Fall Injury- Low Bed Criteria Previous fall this admission  Will Implement Low Bed and Floor Mats Low bed contraindicated, floor mats in place  Screening for Fall Injury Risk (To be completed on HIGH fall risk patients who do not meet crieteria for Low Bed) - Assessing Need for Floor Mats Only  Risk For Fall Injury- Criteria for Floor Mats Noncompliant with safety precautions  Will Implement Floor Mats Yes  Vitals  Temp 97.8 F (36.6 C)  Temp Source Oral  BP 112/67  BP Location Left Arm  BP Method Automatic  Patient Position (if appropriate) Sitting  Pulse Rate 76  Pulse Rate Source Monitor  Resp 16  Oxygen Therapy  SpO2 99 %  O2 Device Room Air  Pain Assessment  Pain Scale 0-10  Pain Score 0  Neurological  Level of Consciousness Alert  Orientation Level Oriented X4  Cognition Follows commands;Impulsive;Poor attention/concentration;Poor judgement;Poor safety awareness;Memory impairment  Musculoskeletal  Musculoskeletal (WDL) WDL  Integumentary  Integumentary (WDL) X  Abrasion Location Hand  Abrasion Location Orientation Right  Abrasion  Intervention Cleansed

## 2020-11-14 NOTE — Progress Notes (Signed)
PROGRESS NOTE  Dustin Cruz JQB:[id number] DOB: 30-Apr-1966 DOA: 10/22/2020 PCP: [name], DO  HPI/Recap of past 24 hours: HPI from Dr [place] Constable D Packis a 55 y.o.male admitted with significant volume overload/anasarca in the setting of acute on chronic diastolic CHF exacerbation with poor compliance on home medications.  Cardiology was consulted, patient was adequately diuresed with significant improvement.  Patient was incidentally found to be Covid positive while performing routine testing to discharge to SNF.  Remains asymptomatic.  Plan is to discharge to SNF as pt is homeless  Today (11/14/20): Patient denies any new complaints; no overnight events.  Mechanical fall earlier this morning without hitting his head or causing any major injury.  Following commands appropriately.  No requiring oxygen supplementation and expressing no shortness of breath.   Assessment/Plan: Active Problems:   CKD stage 3 due to type 2 diabetes mellitus (HCC)   Essential hypertension, benign   Acute on chronic diastolic (congestive) heart failure (HCC)   COVID   Significant volume overload with anasarca/acute on chronic combined systolic and diastolic HF -Improved/stable -Last 2D echocardiogram performed on10/16/2021 with LVEF 60-65% and grade 3 diastolic dysfunction, repeated Echo 1/18 with LVEF 35-40% -Cardiology consulted, signed off, recommend p.o. torsemide 60 mg twice daily due to worsening kidney function, started Entresto -Hypoalbuminemia may also be playing a role in overall edema, received albumin infusion 10/28/2020. Will start Glucerna daily. -Continue to follow strict I's and O's, daily weights  CAD/hyperlipidemia -Currently denies shortness of breath and is chest pain-free -Heart cath in 2013, was recommended medical management due to small vessel size -Continue the use of Lipitor and Toprol  Wide-Complex Tachycardia/Possible Atypical Atrial Flutter -Cardiology consulted, not  felt to be a candidate for an ICD given his medication noncompliance and noncompliance with visits as an outpatient. -Continue Toprol-XL 25mg  daily, Eliquis 5mg  BID (was on prior to admission given history of a DVT). -Patient denies palpitations; noncompliant with telemetry in house.  Diabetes mellitus type 2 -A1c on 09/2020 was 14, uncontrolled -Very noncompliant (ordering food/pizza from outside restaurants) -Continue SSI, lantus, Accu-Cheks, hypoglycemic protocol; based on CBGs fluctuation will follow adjust hypoglycemic regimen.  LLE wound -Appears to be chronic -Continue daily dressing and wound care.  CKD stage IIIa -Creatinine at baseline -Continue to closely follow renal function trending up actively diuresing patient.  Incidental COVID-19 infection -Asymptomatic, needs 10 days of isolation -Continue supportive care.  COPD -Stable currently no wheezing. -Continue Dulera, and as needed rescue inhalers.    GERD -Continue famotidine     Malnutrition Type:  Nutrition Problem: Food and nutrition related knowledge deficit Etiology: limited prior education (CHF)   Malnutrition Characteristics:  Signs/Symptoms: per patient/family report       Estimated body mass index is 27.8 kg/m as calculated from the following:   Height as of this encounter: 6\' 2"  (1.88 m).   Weight as of this encounter: 98.2 kg.     Code Status: Full  Family Communication: Discussed with significant other on 11/10/20  Disposition Plan: Status is: Inpatient  Remains inpatient appropriate because:Inpatient level of care appropriate due to severity of illness   Dispo: The patient is from: Home (living with friends, homeless)              Anticipated d/c is to: SNF              Anticipated d/c date is: 1 day              Patient currently is  medically stable to d/c.   Difficult to place patient Yes   Consultants:  Cardiology  Procedures:  None  Antimicrobials:  None  DVT  prophylaxis: Eliquis   Objective: Vitals:   11/13/20 2040 11/13/20 2140 11/14/20 0736 11/14/20 1017  BP:  91/67  112/67  Pulse:  88  76  Resp:  20  16  Temp:  98.3 F (36.8 C)  97.8 F (36.6 C)  TempSrc:  Oral  Oral  SpO2: 96% 100% 95% 99%  Weight:      Height:        Intake/Output Summary (Last 24 hours) at 11/14/2020 1039 Last data filed at 11/14/2020 0300 Gross per 24 hour  Intake 800 ml  Output 6 ml  Net 794 ml   Filed Weights   11/10/20 0500 11/12/20 0547 11/13/20 0612  Weight: 98.4 kg 98.1 kg 98.2 kg    Exam: General exam: Alert, awake, oriented x 3, feeling weak and deconditioned; in no acute distress.  Denies chest pain, no nausea, no vomiting, no fever.  Due to patient's impulsive behavior, poor safety awareness and poor attention/concentration; he ended experiencing mechanical full while using the bathroom unattended earlier today.  No major injury sustained, he didn't hit his head. Respiratory system: Clear to auscultation. Respiratory effort normal. Cardiovascular system:RRR. No murmurs, rubs, gallops. Gastrointestinal system: Abdomen is nondistended, soft and nontender. No organomegaly or masses felt. Normal bowel sounds heard. Central nervous system: Alert and oriented. No focal neurological deficits. Extremities: No trace to 1+ edema appreciated bilaterally; no cyanosis or clubbing.  Lateral dorsal aspect of his left foot with nonpurulent 1; also with 1 no presenting drainage in the back of his left calf.  These wounds were present on admission. Skin: No petechiae. Psychiatry: Mood & affect appropriate.    Data Reviewed: CBC: No results for input(s): WBC, NEUTROABS, HGB, HCT, MCV, PLT in the last 168 hours.   Basic Metabolic Panel: Recent Labs  Lab 11/10/20 0436 11/11/20 0806 11/12/20 0315 11/13/20 0648 11/14/20 0647  NA 135 132* 136 135 133*  K 3.5 3.7 3.9 4.4 4.7  CL 91* 92* 91* 92* 91*  CO2 33* 30 35* 32 31  GLUCOSE 106* 187* 151* 77 181*  BUN  34* 31* 31* 32* 38*  CREATININE 1.24 1.25* 1.40* 1.31* 1.53*  CALCIUM 8.4* 8.0* 8.7* 8.5* 8.6*   GFR: Estimated Creatinine Clearance: 63.4 mL/min (A) (by C-G formula based on SCr of 1.53 mg/dL (H)).  CBG: Recent Labs  Lab 11/13/20 1121 11/13/20 1804 11/13/20 2143 11/14/20 0337 11/14/20 0816  GLUCAP 196* 156* 112* 145* 150*   Urine analysis:    Component Value Date/Time   COLORURINE YELLOW 09/15/2020 2055   APPEARANCEUR CLEAR 09/15/2020 2055   APPEARANCEUR Cloudy (A) 06/10/2019 1549   LABSPEC 1.024 09/15/2020 2055   PHURINE 5.0 09/15/2020 2055   GLUCOSEU >=500 (A) 09/15/2020 2055   HGBUR MODERATE (A) 09/15/2020 2055   BILIRUBINUR NEGATIVE 09/15/2020 2055   BILIRUBINUR Negative 06/10/2019 1549   KETONESUR NEGATIVE 09/15/2020 2055   PROTEINUR >=300 (A) 09/15/2020 2055   UROBILINOGEN 0.2 01/26/2014 1618   NITRITE NEGATIVE 09/15/2020 2055   LEUKOCYTESUR NEGATIVE 09/15/2020 2055    No results found for this or any previous visit (from the past 240 hour(s)).    Studies: No results found.  Scheduled Meds: . apixaban  5 mg Oral BID  . atorvastatin  80 mg Oral Daily  . busPIRone  7.5 mg Oral BID  . famotidine  20 mg Oral BID  .  feeding supplement  237 mL Oral Q breakfast  . feeding supplement (GLUCERNA SHAKE)  237 mL Oral Q24H  . gabapentin  300 mg Oral QHS  . hydrocerin   Topical BID  . insulin aspart  0-5 Units Subcutaneous QHS  . insulin aspart  0-9 Units Subcutaneous TID WC  . insulin aspart  3 Units Subcutaneous TID WC  . insulin glargine  10 Units Subcutaneous QPM  . lidocaine  1 patch Transdermal Q24H  . melatonin  6 mg Oral QHS  . metoprolol succinate  25 mg Oral Daily  . mometasone-formoterol  2 puff Inhalation BID  . nystatin   Topical BID  . potassium chloride  20 mEq Oral BID  . sacubitril-valsartan  1 tablet Oral BID  . sodium chloride flush  3 mL Intravenous Q12H  . torsemide  60 mg Oral BID  . traZODone  150 mg Oral QHS    Continuous  Infusions: . sodium chloride       LOS: 23 days     Barton Dubois, MD [facility]  If 7PM-7AM, please contact night-coverage [email or web address] 11/14/2020, 10:39 AM

## 2020-11-15 DIAGNOSIS — I5033 Acute on chronic diastolic (congestive) heart failure: Secondary | ICD-10-CM | POA: Diagnosis not present

## 2020-11-15 DIAGNOSIS — E1122 Type 2 diabetes mellitus with diabetic chronic kidney disease: Secondary | ICD-10-CM | POA: Diagnosis not present

## 2020-11-15 DIAGNOSIS — I10 Essential (primary) hypertension: Secondary | ICD-10-CM | POA: Diagnosis not present

## 2020-11-15 DIAGNOSIS — R601 Generalized edema: Secondary | ICD-10-CM | POA: Diagnosis not present

## 2020-11-15 LAB — BASIC METABOLIC PANEL
Anion gap: 12 (ref 5–15)
BUN: 43 mg/dL — ABNORMAL HIGH (ref 6–20)
CO2: 30 mmol/L (ref 22–32)
Calcium: 8.7 mg/dL — ABNORMAL LOW (ref 8.9–10.3)
Chloride: 91 mmol/L — ABNORMAL LOW (ref 98–111)
Creatinine, Ser: 1.64 mg/dL — ABNORMAL HIGH (ref 0.61–1.24)
GFR, Estimated: 49 mL/min — ABNORMAL LOW (ref >60)
Glucose, Bld: 125 mg/dL — ABNORMAL HIGH (ref 70–99)
Potassium: 4.4 mmol/L (ref 3.5–5.1)
Sodium: 133 mmol/L — ABNORMAL LOW (ref 135–145)

## 2020-11-15 LAB — GLUCOSE, CAPILLARY
Glucose-Capillary: 148 mg/dL — ABNORMAL HIGH (ref 70–99)
Glucose-Capillary: 109 mg/dL — ABNORMAL HIGH (ref 70–99)
Glucose-Capillary: 104 mg/dL — ABNORMAL HIGH (ref 70–99)
Glucose-Capillary: 143 mg/dL — ABNORMAL HIGH (ref 70–99)
Glucose-Capillary: 155 mg/dL — ABNORMAL HIGH (ref 70–99)

## 2020-11-15 LAB — SARS CORONAVIRUS 2 (TAT 6-24 HRS): SARS Coronavirus 2: POSITIVE — AB

## 2020-11-15 NOTE — Progress Notes (Signed)
PROGRESS NOTE  Dustin Cruz EGB:[id number] DOB: 07/19/1966 DOA: 10/22/2020 PCP: [name], DO  HPI/Recap of past 24 hours: HPI from Dr Dustin Cruz a 55 y.o.male admitted with significant volume overload/anasarca in the setting of acute on chronic diastolic CHF exacerbation with poor compliance on home medications.  Cardiology was consulted, patient was adequately diuresed with significant improvement.  Patient was incidentally found to be Covid positive while performing routine testing to discharge to SNF.  Remains asymptomatic.  Plan is to discharge to SNF as pt is homeless  Today (11/15/20): No chest pain, no nausea, no vomiting.  No events reported overnight.  Patient was following commands appropriately and expressing no shortness of breath.  No oxygen supplementation required.    Assessment/Plan: Active Problems:   CKD stage 3 due to type 2 diabetes mellitus (HCC)   Essential hypertension, benign   Acute on chronic diastolic (congestive) heart failure (HCC)   COVID   Significant volume overload with anasarca/acute on chronic combined systolic and diastolic HF -Improved/stable -Last 2D echocardiogram performed on10/16/2021 with LVEF 60-65% and grade 3 diastolic dysfunction, repeated Echo 1/18 with LVEF 35-40% -Cardiology consulted, signed off, recommend p.o. torsemide 60 mg twice daily due to worsening kidney function, started Entresto -Hypoalbuminemia may also be playing a role in overall edema, received albumin infusion 10/28/2020. Will start Glucerna daily. -Continue to follow strict I's and O's, daily weights  CAD/hyperlipidemia -Currently denies shortness of breath and is chest pain-free -Heart cath in 2013, was recommended medical management due to small vessel size -Continue the use of Lipitor and Toprol  Wide-Complex Tachycardia/Possible Atypical Atrial Flutter -Cardiology consulted, not felt to be a candidate for an ICD given his medication  noncompliance and noncompliance with visits as an outpatient. -Continue Toprol-XL 25mg  daily, Eliquis 5mg  BID (was on prior to admission given history of a DVT). -Patient denies palpitations; noncompliant with telemetry in house.  Diabetes mellitus type 2 -A1c on 09/2020 was 14, uncontrolled -Very noncompliant (ordering food/pizza from outside restaurants) -Continue SSI, lantus, Accu-Cheks, hypoglycemic protocol; based on CBGs fluctuation will follow adjust hypoglycemic regimen.  LLE wound -Appears to be chronic -Continue daily dressing and wound care.  CKD stage IIIa -Creatinine at baseline -Continue to closely follow renal function trending up actively diuresing patient.  Incidental COVID-19 infection -Asymptomatic, needs 10 days of isolation -Continue supportive care.  COPD -Stable currently no wheezing. -Continue Dulera, and as needed rescue inhalers.    GERD -Continue famotidine     Malnutrition Type:  Nutrition Problem: Food and nutrition related knowledge deficit Etiology: limited prior education (CHF)   Malnutrition Characteristics:  Signs/Symptoms: per patient/family report       Estimated body mass index is 27.57 kg/m as calculated from the following:   Height as of this encounter: 6\' 2"  (1.88 m).   Weight as of this encounter: 97.4 kg.     Code Status: Full  Family Communication: Discussed with significant other on 11/10/20  Disposition Plan: Status is: Inpatient  Remains inpatient appropriate because:Inpatient level of care appropriate due to severity of illness   Dispo: The patient is from: Home (living with friends, homeless)              Anticipated d/c is to: SNF              Anticipated d/c date is: 1 day              Patient currently is medically stable to d/c.   Difficult to  place patient Yes   Consultants:  Cardiology  Procedures:  None  Antimicrobials:  None  DVT prophylaxis: Eliquis   Objective: Vitals:    11/15/20 0443 11/15/20 0500 11/15/20 0808 11/15/20 1458  BP: 113/76   103/70  Pulse:    83  Resp: 18   18  Temp: 97.7 F (36.5 C)   (!) 97.5 F (36.4 C)  TempSrc: Oral   Oral  SpO2: 100%  98% 98%  Weight:  97.4 kg    Height:        Intake/Output Summary (Last 24 hours) at 11/15/2020 1742 Last data filed at 11/15/2020 1300 Gross per 24 hour  Intake 1060 ml  Output 1025 ml  Net 35 ml   Filed Weights   11/12/20 0547 11/13/20 0612 11/15/20 0500  Weight: 98.1 kg 98.2 kg 97.4 kg    Exam: General exam: Alert, awake, oriented x 3; no chest pain, no nausea, no vomiting, no orthopnea.  Good saturation on room air without supplementation.  Still feeling weak and deconditioned. Respiratory system: Clear to auscultation. Respiratory effort normal.  No using accessory muscles. Cardiovascular system:RRR. No murmurs, rubs, gallops.  No JVD appreciated on exam. Gastrointestinal system: Abdomen is nondistended, soft and nontender. No organomegaly or masses felt. Normal bowel sounds heard. Central nervous system: Alert and oriented. No focal neurological deficits. Extremities/skin: No cyanosis or clubbing.  Trace edema bilaterally appreciated.  No purulent wounds in the left dorsal aspect of his foot and the back of his calf.  Bilateral chronic stasis dermatitis seen. Psychiatry: Mood & affect appropriate.   Data Reviewed: CBC: No results for input(s): WBC, NEUTROABS, HGB, HCT, MCV, PLT in the last 168 hours.   Basic Metabolic Panel: Recent Labs  Lab 11/11/20 0806 11/12/20 0315 11/13/20 0648 11/14/20 0647 11/15/20 0400  NA 132* 136 135 133* 133*  K 3.7 3.9 4.4 4.7 4.4  CL 92* 91* 92* 91* 91*  CO2 30 35* 32 31 30  GLUCOSE 187* 151* 77 181* 125*  BUN 31* 31* 32* 38* 43*  CREATININE 1.25* 1.40* 1.31* 1.53* 1.64*  CALCIUM 8.0* 8.7* 8.5* 8.6* 8.7*   GFR: Estimated Creatinine Clearance: 59.2 mL/min (A) (by C-G formula based on SCr of 1.64 mg/dL (H)).  CBG: Recent Labs  Lab  11/14/20 2037 11/15/20 0256 11/15/20 0734 11/15/20 1206 11/15/20 1654  GLUCAP 151* 148* 109* 104* 143*   Urine analysis:    Component Value Date/Time   COLORURINE YELLOW 09/15/2020 2055   APPEARANCEUR CLEAR 09/15/2020 2055   APPEARANCEUR Cloudy (A) 06/10/2019 1549   LABSPEC 1.024 09/15/2020 2055   PHURINE 5.0 09/15/2020 2055   GLUCOSEU >=500 (A) 09/15/2020 2055   HGBUR MODERATE (A) 09/15/2020 2055   BILIRUBINUR NEGATIVE 09/15/2020 2055   BILIRUBINUR Negative 06/10/2019 1549   KETONESUR NEGATIVE 09/15/2020 2055   PROTEINUR >=300 (A) 09/15/2020 2055   UROBILINOGEN 0.2 01/26/2014 1618   NITRITE NEGATIVE 09/15/2020 2055   LEUKOCYTESUR NEGATIVE 09/15/2020 2055    Recent Results (from the past 240 hour(s))  SARS CORONAVIRUS 2 (TAT 6-24 HRS) Nasopharyngeal Nasopharyngeal Swab     Status: Abnormal   Collection Time: 11/14/20  1:26 PM   Specimen: Nasopharyngeal Swab  Result Value Ref Range Status   SARS Coronavirus 2 POSITIVE (A) NEGATIVE Final    Comment: (NOTE) SARS-CoV-2 target nucleic acids are DETECTED.  The SARS-CoV-2 RNA is generally detectable in upper and lower respiratory specimens during the acute phase of infection. Positive results are indicative of the presence of SARS-CoV-2 RNA. Clinical  correlation with patient history and other diagnostic information is  necessary to determine patient infection status. Positive results do not rule out bacterial infection or co-infection with other viruses.  The expected result is Negative.  Fact Sheet for Patients: SugarRoll.be  Fact Sheet for Healthcare Providers: https://www.woods-mathews.com/  This test is not yet approved or cleared by the [place] FDA and  has been authorized for detection and/or diagnosis of SARS-CoV-2 by FDA under an Emergency Use Authorization (EUA). This EUA will remain  in effect (meaning this test can be used) for the duration of the COVID-19  declaration under Section 564(b)(1) of the Act, 21 U. S.C. section 360bbb-3(b)(1), unless the authorization is terminated or revoked sooner.   Performed at [facility], [address], [place], [place] [address]       Studies: No results found.  Scheduled Meds: . apixaban  5 mg Oral BID  . atorvastatin  80 mg Oral Daily  . busPIRone  7.5 mg Oral BID  . famotidine  20 mg Oral BID  . feeding supplement  237 mL Oral Q breakfast  . feeding supplement (GLUCERNA SHAKE)  237 mL Oral Q24H  . gabapentin  300 mg Oral QHS  . hydrocerin   Topical BID  . insulin aspart  0-5 Units Subcutaneous QHS  . insulin aspart  0-9 Units Subcutaneous TID WC  . insulin aspart  3 Units Subcutaneous TID WC  . insulin glargine  10 Units Subcutaneous QPM  . lidocaine  1 patch Transdermal Q24H  . melatonin  6 mg Oral QHS  . metoprolol succinate  25 mg Oral Daily  . mometasone-formoterol  2 puff Inhalation BID  . nystatin   Topical BID  . potassium chloride  20 mEq Oral BID  . sacubitril-valsartan  1 tablet Oral BID  . sodium chloride flush  3 mL Intravenous Q12H  . torsemide  60 mg Oral BID  . traZODone  150 mg Oral QHS    Continuous Infusions: . sodium chloride       LOS: 24 days     Barton Dubois, MD [facility]  If 7PM-7AM, please contact night-coverage [email or web address] 11/15/2020, 5:42 PM

## 2020-11-15 NOTE — Care Management Important Message (Signed)
Important Message  Patient Details  Name: Dustin Cruz MRN: [id number] Date of Birth: [birth year]   Medicare Important Message Given:  Yes - Important Message mailed due to current National Emergency     Tommy Medal 11/15/2020, 2:49 PM

## 2020-11-15 NOTE — TOC Progression Note (Signed)
Transition of Care [facility]) - Progression Note    Patient Details  Name: Dustin Cruz MRN: [id number] Date of Birth: 07-16-1966  [facility] CM/SW Contact  Natasha Bence, LCSW Phone Number: 11/15/2020, 3:52 PM  Clinical Narrative:    CSW spoke with Perrin Smack from [facility] admissions. Kitty agreeable to take patient on 11/16/2020. CSW received approval for authorization from [facility] for [place]. No Approval ID has not been generated yet. CSW notified MD. TOC to follow.   Expected Discharge Plan: Skilled Nursing Facility Barriers to Discharge: No SNF bed,SNF Pending bed offer  Expected Discharge Plan and Services Expected Discharge Plan: [place] In-house Referral: Clinical Social Work Discharge Planning Services: NA Post Acute Care Choice: NA Living arrangements for the past 2 months: Homeless                 DME Arranged: N/A DME Agency: NA       HH Arranged: NA HH Agency: NA         Social Determinants of Health (SDOH) Interventions    Readmission Risk Interventions Readmission Risk Prevention Plan 10/25/2020 09/29/2020  Transportation Screening Complete Complete  Medication Review Press photographer) Complete Complete  HRI or Home Care Consult Complete Complete  SW Recovery Care/Counseling Consult Complete Complete  Palliative Care Screening Not Applicable Not Applicable  Skilled Nursing Facility Not Applicable Not Complete  Some recent data might be hidden

## 2020-11-16 DIAGNOSIS — R601 Generalized edema: Secondary | ICD-10-CM | POA: Diagnosis not present

## 2020-11-16 DIAGNOSIS — I5033 Acute on chronic diastolic (congestive) heart failure: Secondary | ICD-10-CM | POA: Diagnosis not present

## 2020-11-16 DIAGNOSIS — E1122 Type 2 diabetes mellitus with diabetic chronic kidney disease: Secondary | ICD-10-CM | POA: Diagnosis not present

## 2020-11-16 DIAGNOSIS — I10 Essential (primary) hypertension: Secondary | ICD-10-CM | POA: Diagnosis not present

## 2020-11-16 LAB — GLUCOSE, CAPILLARY
Glucose-Capillary: 190 mg/dL — ABNORMAL HIGH (ref 70–99)
Glucose-Capillary: 218 mg/dL — ABNORMAL HIGH (ref 70–99)
Glucose-Capillary: 276 mg/dL — ABNORMAL HIGH (ref 70–99)
Glucose-Capillary: 201 mg/dL — ABNORMAL HIGH (ref 70–99)
Glucose-Capillary: 266 mg/dL — ABNORMAL HIGH (ref 70–99)

## 2020-11-16 MED ORDER — HYDROCODONE-ACETAMINOPHEN 7.5-325 MG PO TABS
1.0000 | ORAL_TABLET | Freq: Once | ORAL | Status: AC
  Administered 2020-11-16: 1 via ORAL
  Filled 2020-11-16: qty 1

## 2020-11-16 MED ORDER — MORPHINE SULFATE (PF) 2 MG/ML IV SOLN: 2.0000 mg | INTRAVENOUS | Status: DC | PRN

## 2020-11-16 NOTE — Progress Notes (Signed)
PROGRESS NOTE  Dustin Cruz YOV:[id number] DOB: 26-Jul-1966 DOA: 10/22/2020 PCP: [name], DO  HPI/Recap of past 24 hours: HPI from Dr [place] Constable D Packis a 55 y.o.male admitted with significant volume overload/anasarca in the setting of acute on chronic diastolic CHF exacerbation with poor compliance on home medications.  Cardiology was consulted, patient was adequately diuresed with significant improvement.  Patient was incidentally found to be Covid positive while performing routine testing to discharge to SNF.  Remains asymptomatic.  Plan is to discharge to SNF as pt is homeless  Today (11/16/20): No overnight events.  No chest pain, no nausea, no vomiting, no shortness of breath.  Patient is no requiring oxygen supplementation.  Expressed feeling weak and deconditioned.  Still having difficulties to be placed.   Assessment/Plan: Active Problems:   CKD stage 3 due to type 2 diabetes mellitus (HCC)   Essential hypertension, benign   Acute on chronic diastolic (congestive) heart failure (HCC)   COVID   Significant volume overload with anasarca/acute on chronic combined systolic and diastolic HF -Improved/stable -Last 2D echocardiogram performed on10/16/2021 with LVEF 60-65% and grade 3 diastolic dysfunction, repeated Echo 1/18 with LVEF 35-40% -Cardiology consulted, signed off, recommend p.o. torsemide 60 mg twice daily due to worsening kidney function, started Entresto -Hypoalbuminemia may also be playing a role in overall edema, received albumin infusion 10/28/2020. Will start Glucerna daily. -Continue to follow strict I's and O's, daily weights  CAD/hyperlipidemia -Currently denies shortness of breath and is chest pain-free -Heart cath in 2013, was recommended medical management due to small vessel size -Continue the use of Lipitor and Toprol  Wide-Complex Tachycardia/Possible Atypical Atrial Flutter -Cardiology consulted, not felt to be a candidate for an ICD given  his medication noncompliance and noncompliance with visits as an outpatient. -Continue Toprol-XL 25mg  daily, Eliquis 5mg  BID (was on prior to admission given history of a DVT). -Patient denies palpitations; noncompliant with telemetry in house.  Diabetes mellitus type 2 -A1c on 09/2020 was 14, uncontrolled -Very noncompliant (ordering food/pizza from outside restaurants) -Continue SSI, lantus, Accu-Cheks, hypoglycemic protocol; based on CBGs fluctuation will follow adjust hypoglycemic regimen.  LLE wound -Appears to be chronic -Continue daily dressing and wound care.  CKD stage IIIa -Creatinine at baseline -Continue to closely follow renal function trending up actively diuresing patient.  Incidental COVID-19 infection -Asymptomatic, needs 10 days of isolation -Continue supportive care.  COPD -Stable currently no wheezing. -Continue Dulera, and as needed rescue inhalers.    GERD -Continue famotidine     Malnutrition Type:  Nutrition Problem: Food and nutrition related knowledge deficit Etiology: limited prior education (CHF)   Malnutrition Characteristics:  Signs/Symptoms: per patient/family report       Estimated body mass index is 26.66 kg/m as calculated from the following:   Height as of this encounter: 6\' 2"  (1.88 m).   Weight as of this encounter: 94.2 kg.     Code Status: Full  Family Communication: Discussed with significant other on 11/10/20  Disposition Plan: Status is: Inpatient  Remains inpatient appropriate because:Inpatient level of care appropriate due to severity of illness   Dispo: The patient is from: Home (living with friends, homeless)              Anticipated d/c is to: SNF              Anticipated d/c date is: 11/18/20              Patient currently is medically stable to d/c.  Difficult to place patient Yes   Consultants:  Cardiology  Procedures:  None  Antimicrobials:  None  DVT  prophylaxis: Eliquis   Objective: Vitals:   11/16/20 0550 11/16/20 0812 11/16/20 0838 11/16/20 1413  BP: 114/69 109/70  117/81  Pulse: 87 84  84  Resp: 16 16  16   Temp: 97.9 F (36.6 C) 97.7 F (36.5 C)  98.3 F (36.8 C)  TempSrc: Oral Oral  Oral  SpO2: 100% 100% 98% 99%  Weight:      Height:        Intake/Output Summary (Last 24 hours) at 11/16/2020 1541 Last data filed at 11/15/2020 2241 Gross per 24 hour  Intake 340 ml  Output -  Net 340 ml   Filed Weights   11/13/20 0612 11/15/20 0500 11/16/20 0500  Weight: 98.2 kg 97.4 kg 94.2 kg    Exam: General exam: Alert, awake, oriented x 3; feeling weak and deconditioned.  Patient denies chest pain, no orthopnea, no nausea, no vomiting, no chest pain.  Good saturation on room air. Respiratory system: Clear to auscultation. Respiratory effort normal.  No using accessory muscle. Cardiovascular system:RRR. No murmurs, rubs, gallops.  No JVD Gastrointestinal system: Abdomen is nondistended, soft and nontender. No organomegaly or masses felt. Normal bowel sounds heard. Central nervous system: Alert and oriented. No focal neurological deficits. Extremities: No cyanosis or clubbing Skin: No petechiae.  Unchanged left leg wounds, no superimposed infection.  Chronic stasis dermatitis bilaterally appreciated. Psychiatry: Mood & affect appropriate.    Data Reviewed: CBC: No results for input(s): WBC, NEUTROABS, HGB, HCT, MCV, PLT in the last 168 hours.   Basic Metabolic Panel: Recent Labs  Lab 11/11/20 0806 11/12/20 0315 11/13/20 0648 11/14/20 0647 11/15/20 0400  NA 132* 136 135 133* 133*  K 3.7 3.9 4.4 4.7 4.4  CL 92* 91* 92* 91* 91*  CO2 30 35* 32 31 30  GLUCOSE 187* 151* 77 181* 125*  BUN 31* 31* 32* 38* 43*  CREATININE 1.25* 1.40* 1.31* 1.53* 1.64*  CALCIUM 8.0* 8.7* 8.5* 8.6* 8.7*   GFR: Estimated Creatinine Clearance: 59.2 mL/min (A) (by C-G formula based on SCr of 1.64 mg/dL (H)).  CBG: Recent Labs  Lab  11/15/20 1654 11/15/20 2032 11/16/20 0325 11/16/20 0800 11/16/20 1129  GLUCAP 143* 155* 190* 218* 276*   Urine analysis:    Component Value Date/Time   COLORURINE YELLOW 09/15/2020 2055   APPEARANCEUR CLEAR 09/15/2020 2055   APPEARANCEUR Cloudy (A) 06/10/2019 1549   LABSPEC 1.024 09/15/2020 2055   PHURINE 5.0 09/15/2020 2055   GLUCOSEU >=500 (A) 09/15/2020 2055   HGBUR MODERATE (A) 09/15/2020 2055   BILIRUBINUR NEGATIVE 09/15/2020 2055   BILIRUBINUR Negative 06/10/2019 1549   KETONESUR NEGATIVE 09/15/2020 2055   PROTEINUR >=300 (A) 09/15/2020 2055   UROBILINOGEN 0.2 01/26/2014 1618   NITRITE NEGATIVE 09/15/2020 2055   LEUKOCYTESUR NEGATIVE 09/15/2020 2055    Recent Results (from the past 240 hour(s))  SARS CORONAVIRUS 2 (TAT 6-24 HRS) Nasopharyngeal Nasopharyngeal Swab     Status: Abnormal   Collection Time: 11/14/20  1:26 PM   Specimen: Nasopharyngeal Swab  Result Value Ref Range Status   SARS Coronavirus 2 POSITIVE (A) NEGATIVE Final    Comment: (NOTE) SARS-CoV-2 target nucleic acids are DETECTED.  The SARS-CoV-2 RNA is generally detectable in upper and lower respiratory specimens during the acute phase of infection. Positive results are indicative of the presence of SARS-CoV-2 RNA. Clinical correlation with patient history and other diagnostic information is  necessary  to determine patient infection status. Positive results do not rule out bacterial infection or co-infection with other viruses.  The expected result is Negative.  Fact Sheet for Patients: SugarRoll.be  Fact Sheet for Healthcare Providers: https://www.woods-mathews.com/  This test is not yet approved or cleared by the [place] FDA and  has been authorized for detection and/or diagnosis of SARS-CoV-2 by FDA under an Emergency Use Authorization (EUA). This EUA will remain  in effect (meaning this test can be used) for the duration of the COVID-19  declaration under Section 564(b)(1) of the Act, 21 U. S.C. section 360bbb-3(b)(1), unless the authorization is terminated or revoked sooner.   Performed at [facility], [place] [address], [place], [place] [address]       Studies: No results found.  Scheduled Meds: . apixaban  5 mg Oral BID  . atorvastatin  80 mg Oral Daily  . busPIRone  7.5 mg Oral BID  . famotidine  20 mg Oral BID  . feeding supplement  237 mL Oral Q breakfast  . feeding supplement (GLUCERNA SHAKE)  237 mL Oral Q24H  . gabapentin  300 mg Oral QHS  . hydrocerin   Topical BID  . insulin aspart  0-5 Units Subcutaneous QHS  . insulin aspart  0-9 Units Subcutaneous TID WC  . insulin aspart  3 Units Subcutaneous TID WC  . insulin glargine  10 Units Subcutaneous QPM  . lidocaine  1 patch Transdermal Q24H  . melatonin  6 mg Oral QHS  . metoprolol succinate  25 mg Oral Daily  . mometasone-formoterol  2 puff Inhalation BID  . nystatin   Topical BID  . potassium chloride  20 mEq Oral BID  . sacubitril-valsartan  1 tablet Oral BID  . sodium chloride flush  3 mL Intravenous Q12H  . torsemide  60 mg Oral BID  . traZODone  150 mg Oral QHS    Continuous Infusions: . sodium chloride       LOS: 25 days     Barton Dubois, MD [facility]  If 7PM-7AM, please contact night-coverage [email or web address] 11/16/2020, 3:41 PM

## 2020-11-16 NOTE — TOC Progression Note (Signed)
Transition of Care [facility]) - Progression Note    Patient Details  Name: Dustin Cruz MRN: [id number] Date of Birth: Mar 26, 1966  Transition of Care [facility]) CM/SW Contact  Ihor Gully, LCSW Phone Number: 11/16/2020, 11:17 AM  Clinical Narrative:    [name] is unable to admit patient. Debbie at [facility] states that the can accept patient when he is 14 days from diagnosis which will be on 11/19/19.   Expected Discharge Plan: Skilled Nursing Facility Barriers to Discharge: No SNF bed,SNF Pending bed offer  Expected Discharge Plan and Services Expected Discharge Plan: [place] In-house Referral: Clinical Social Work Discharge Planning Services: NA Post Acute Care Choice: NA Living arrangements for the past 2 months: Homeless                 DME Arranged: N/A DME Agency: NA       HH Arranged: NA HH Agency: NA         Social Determinants of Health (SDOH) Interventions    Readmission Risk Interventions Readmission Risk Prevention Plan 10/25/2020 09/29/2020  Transportation Screening Complete Complete  Medication Review Press photographer) Complete Complete  HRI or Home Care Consult Complete Complete  SW Recovery Care/Counseling Consult Complete Complete  Palliative Care Screening Not Applicable Not Applicable  Skilled Nursing Facility Not Applicable Not Complete  Some recent data might be hidden

## 2020-11-16 NOTE — Progress Notes (Signed)
Physical Therapy Treatment Patient Details Name: Dustin Cruz MRN: [id number] DOB: [birth year] Today's Date: 11/16/2020    History of Present Illness Dustin Cruz is a 55 y.o. male with medical history significant for CAD, type 2 diabetes, diastolic heart failure, GERD, and COPD who presented to the ED with worsening lower extremity edema as well as abdominal edema and scrotal edema as well as some weakness.  He is currently homeless and lives in his car.  He states that he had run out of his Lasix as well as his diabetes medication for the last few days.  It appears that on last admission on 09/2020 that he left AMA as he was being treated for edema at that time.  He denies any fever, chills, abdominal pain, nausea, vomiting, chest pain, or other concerns.    PT Comments    Patient presents seated at bedside and agreeable for therapy.  Patient demonstrates slightly increased endurance for gait training and able to ambulate in room without use of an AD, slightly labored movement with no loss of balance, limited mostly due to c/o fatigue.  Patient continued sitting up at bedside after therapy.  Patient will benefit from continued physical therapy in hospital and recommended venue below to increase strength, balance, endurance for safe ADLs and gait.    Follow Up Recommendations  Home health PT;Supervision - Intermittent     Equipment Recommendations  Other (comment);Rolling walker with 5" wheels (Rollator)    Recommendations for Other Services       Precautions / Restrictions Precautions Precautions: Fall Restrictions Weight Bearing Restrictions: No    Mobility  Bed Mobility Overal bed mobility: Modified Independent                Transfers Overall transfer level: Needs assistance Equipment used: None Transfers: Sit to/from [employer] Transfers Sit to Stand: Modified independent (Device/Increase time);Supervision Stand pivot transfers: Modified independent  (Device/Increase time);Supervision       General transfer comment: demonstrates good return for transfers without use of an AD  Ambulation/Gait Ambulation/Gait assistance: Supervision Gait Distance (Feet): 40 Feet Assistive device: None Gait Pattern/deviations: Decreased step length - left;Decreased stance time - right;Decreased stride length Gait velocity: decreased.   General Gait Details: demonstrates good return for ambulation in room without use of an AD without loss of balance, limited mostly due to fatigue   Stairs             Wheelchair Mobility    Modified Rankin (Stroke Patients Only)       Balance Overall balance assessment: Mild deficits observed, not formally tested                                          Cognition Arousal/Alertness: Awake/alert Behavior During Therapy: WFL for tasks assessed/performed Overall Cognitive Status: Within Functional Limits for tasks assessed                                        Exercises General Exercises - Lower Extremity Long Arc Quad: Seated;AROM;Strengthening;Both;5 reps Hip Flexion/Marching: Seated;Strengthening;Both;AAROM;5 reps Toe Raises: Seated;AROM;Strengthening;Both;10 reps Heel Raises: Seated;AROM;Strengthening;Both;10 reps    General Comments        Pertinent Vitals/Pain Pain Assessment: Faces Faces Pain Scale: Hurts little more Pain Location: right anterior elbow Pain Descriptors / Indicators: Sore  Pain Intervention(s): Limited activity within patient's tolerance;Monitored during session    Home Living                      Prior Function            PT Goals (current goals can now be found in the care plan section) Acute Rehab PT Goals Patient Stated Goal: Get stronger PT Goal Formulation: With patient Time For Goal Achievement: 11/23/20 Potential to Achieve Goals: Good Progress towards PT goals: Progressing toward goals    Frequency     Min 2X/week      PT Plan Current plan remains appropriate    Co-evaluation              AM-PAC PT "6 Clicks" Mobility   Outcome Measure  Help needed turning from your back to your side while in a flat bed without using bedrails?: None Help needed moving from lying on your back to sitting on the side of a flat bed without using bedrails?: None Help needed moving to and from a bed to a chair (including a wheelchair)?: None Help needed standing up from a chair using your arms (e.g., wheelchair or bedside chair)?: None Help needed to walk in hospital room?: A Little Help needed climbing 3-5 steps with a railing? : A Little 6 Click Score: 22    End of Session   Activity Tolerance: Patient tolerated treatment well;Patient limited by fatigue Patient left: in bed;with call bell/phone within reach Nurse Communication: Mobility status PT Visit Diagnosis: Unsteadiness on feet (R26.81);Other abnormalities of gait and mobility (R26.89);Muscle weakness (generalized) (M62.81)     Time: 0947-0962 PT Time Calculation (min) (ACUTE ONLY): 21 min  Charges:  $Therapeutic Exercise: 8-22 mins $Therapeutic Activity: 8-22 mins                     3:46 PM, 11/16/20 Lonell Grandchild, MPT Physical Therapist with [facility] [phone] office [phone] mobile phone

## 2020-11-17 ENCOUNTER — Inpatient Hospital Stay (HOSPITAL_COMMUNITY): Payer: 59

## 2020-11-17 LAB — GLUCOSE, CAPILLARY
Glucose-Capillary: 191 mg/dL — ABNORMAL HIGH (ref 70–99)
Glucose-Capillary: 135 mg/dL — ABNORMAL HIGH (ref 70–99)
Glucose-Capillary: 231 mg/dL — ABNORMAL HIGH (ref 70–99)
Glucose-Capillary: 229 mg/dL — ABNORMAL HIGH (ref 70–99)
Glucose-Capillary: 270 mg/dL — ABNORMAL HIGH (ref 70–99)

## 2020-11-17 NOTE — TOC Progression Note (Signed)
Transition of Care [facility]) - Progression Note    Patient Details  Name: Dustin Cruz MRN: [id number] Date of Birth: 1965-10-15  Transition of Care [facility]) CM/SW Contact  Ihor Gully, LCSW Phone Number: 11/17/2020, 1:59 PM  Clinical Narrative:    Jackelyn Poling at [facility] can accept patient on 2/10 as he will be 14 days from his positive diagnosis. Patient's authorization with Everlene Balls was switched to Anette Riedel number [id number], for 3 days, next review date 2/12, fax clinicals to [phone], start date of 2/10.    Expected Discharge Plan: Skilled Nursing Facility Barriers to Discharge: No SNF bed,SNF Pending bed offer  Expected Discharge Plan and Services Expected Discharge Plan: [place] In-house Referral: Clinical Social Work Discharge Planning Services: NA Post Acute Care Choice: NA Living arrangements for the past 2 months: Homeless                 DME Arranged: N/A DME Agency: NA       HH Arranged: NA HH Agency: NA         Social Determinants of Health (SDOH) Interventions    Readmission Risk Interventions Readmission Risk Prevention Plan 10/25/2020 09/29/2020  Transportation Screening Complete Complete  Medication Review Press photographer) Complete Complete  HRI or Home Care Consult Complete Complete  SW Recovery Care/Counseling Consult Complete Complete  Palliative Care Screening Not Applicable Not Applicable  Skilled Nursing Facility Not Applicable Not Complete  Some recent data might be hidden

## 2020-11-17 NOTE — Progress Notes (Signed)
End of shift note: Pt alert, oriented and cooperative entire shift. Aware of fluid restrictions but still frequently asking various staff members for something to drink. Pt with noted edema and reddness to right elbow s/p previous fall. MD Dyann Kief assessed and heat [place] applied for comfort per order, secured with lightly wrapped ace wrap. Pt's arm also elevated on pillows. As shift progressed, left elbow/arm less swollen but right hand with increased swelling. Advised pt to keep hand/arm elevated to decrease swelling, stated understanding. Pt aware of planned discharge to SNF and is agreeable. Pt ambulatory in room without assistance or difficulty. Wounds to left inner foot, left medial heel, bilateral lower legs cleaned and redressed, pt tolerated well. Left foot swollen and red around shallow 3cm x 3.5cm ulcer left medial ankle. Yellow slough noted at wound base.

## 2020-11-17 NOTE — Progress Notes (Signed)
PROGRESS NOTE  Dustin Cruz UJW:[id number] DOB: 10/25/1965 DOA: 10/22/2020 PCP: [name], DO  HPI/Recap of past 24 hours: HPI from Dr [place] Constable D Packis a 55 y.o.male admitted with significant volume overload/anasarca in the setting of acute on chronic diastolic CHF exacerbation with poor compliance on home medications.  Cardiology was consulted, patient was adequately diuresed with significant improvement.  Patient was incidentally found to be Covid positive while performing routine testing to discharge to SNF.  Remains asymptomatic.  Plan is to discharge to SNF as pt is homeless  Today (11/17/20): No chest pain, no nausea, no vomiting, no shortness of breath.  Patient is not requiring oxygen supplementation and is currently afebrile.  Still with trace edema appreciated bilaterally.  No signs of superimposed infection on his left lower extremity wounds.  Feeling weak, tired and deconditioned.  No overnight events.   Assessment/Plan: Active Problems:   CKD stage 3 due to type 2 diabetes mellitus (HCC)   Essential hypertension, benign   Acute on chronic diastolic (congestive) heart failure (HCC)   COVID   Significant volume overload with anasarca/acute on chronic combined systolic and diastolic HF -Improved/stable -Last 2D echocardiogram performed on10/16/2021 with LVEF 60-65% and grade 3 diastolic dysfunction, repeated Echo 1/18 with LVEF 35-40% -Cardiology consulted, signed off, recommend p.o. torsemide 60 mg twice daily due to worsening kidney function, started Entresto -Hypoalbuminemia may also be playing a role in overall edema, received albumin infusion 10/28/2020. Will start Glucerna daily. -Continue to follow strict I's and O's, daily weights  CAD/hyperlipidemia -Currently denies shortness of breath and is chest pain-free -Heart cath in 2013, was recommended medical management due to small vessel size -Continue the use of Lipitor and Toprol  Wide-Complex  Tachycardia/Possible Atypical Atrial Flutter -Cardiology consulted, not felt to be a candidate for an ICD given his medication noncompliance and noncompliance with visits as an outpatient. -Continue Toprol-XL 25mg  daily, Eliquis 5mg  BID (was on prior to admission given history of a DVT). -Patient denies palpitations; noncompliant with telemetry in house.  Diabetes mellitus type 2 -A1c on 09/2020 was 14, uncontrolled -Very noncompliant (ordering food/pizza from outside restaurants) -Continue SSI, lantus, Accu-Cheks, hypoglycemic protocol; based on CBGs fluctuation will follow adjust hypoglycemic regimen.  LLE wound -Appears to be chronic -Continue daily dressing and wound care.  CKD stage IIIa -Creatinine at baseline -Continue to closely follow renal function trending up actively diuresing patient.  Incidental COVID-19 infection -Asymptomatic, needs 10 days of isolation -Continue supportive care.  COPD -Stable currently no wheezing. -Continue Dulera, and as needed rescue inhalers.    GERD -Continue famotidine     Malnutrition Type:  Nutrition Problem: Food and nutrition related knowledge deficit Etiology: limited prior education (CHF)   Malnutrition Characteristics:  Signs/Symptoms: per patient/family report       Estimated body mass index is 26.81 kg/m as calculated from the following:   Height as of this encounter: 6\' 2"  (1.88 m).   Weight as of this encounter: 94.7 kg.     Code Status: Full  Family Communication: Discussed with significant other on 11/10/20  Disposition Plan: Status is: Inpatient  Remains inpatient appropriate because:Inpatient level of care appropriate due to severity of illness   Dispo: The patient is from: Home (living with friends, homeless)              Anticipated d/c is to: SNF              Anticipated d/c date is: 11/18/20  Patient currently is medically stable to d/c.   Difficult to place patient  Yes   Consultants:  Cardiology  Procedures:  None  Antimicrobials:  None  DVT prophylaxis: Eliquis   Objective: Vitals:   11/17/20 0327 11/17/20 0500 11/17/20 0807 11/17/20 0900  BP: 125/72   123/87  Pulse: 82   100  Resp: 16   16  Temp: 97.6 F (36.4 C)     TempSrc: Oral     SpO2: 99%  99% 100%  Weight:  94.7 kg    Height:        Intake/Output Summary (Last 24 hours) at 11/17/2020 1405 Last data filed at 11/17/2020 1250 Gross per 24 hour  Intake 480 ml  Output -  Net 480 ml   Filed Weights   11/15/20 0500 11/16/20 0500 11/17/20 0500  Weight: 97.4 kg 94.2 kg 94.7 kg    Exam: General exam: Alert, awake, oriented x 3; feeling weak, tired and deconditioned.  No chest pain, no shortness of breath, no fever, no nausea vomiting. Respiratory system: Clear to auscultation. Respiratory effort normal.  No using accessory muscle. Cardiovascular system:RRR. No murmurs, rubs, gallops.  No JVD. Gastrointestinal system: Abdomen is nondistended, soft and nontender. No organomegaly or masses felt. Normal bowel sounds heard. Central nervous system: No focal neurological deficits. Extremities/skin: No cyanosis or clubbing; left leg wounds without superimposed infection still appreciated.  Chronic excessive dermatitis and trace edema seen on exam. Psychiatry: Mood & affect appropriate.     Data Reviewed: CBC: No results for input(s): WBC, NEUTROABS, HGB, HCT, MCV, PLT in the last 168 hours.   Basic Metabolic Panel: Recent Labs  Lab 11/11/20 0806 11/12/20 0315 11/13/20 0648 11/14/20 0647 11/15/20 0400  NA 132* 136 135 133* 133*  K 3.7 3.9 4.4 4.7 4.4  CL 92* 91* 92* 91* 91*  CO2 30 35* 32 31 30  GLUCOSE 187* 151* 77 181* 125*  BUN 31* 31* 32* 38* 43*  CREATININE 1.25* 1.40* 1.31* 1.53* 1.64*  CALCIUM 8.0* 8.7* 8.5* 8.6* 8.7*   GFR: Estimated Creatinine Clearance: 59.2 mL/min (A) (by C-G formula based on SCr of 1.64 mg/dL (H)).  CBG: Recent Labs  Lab  11/16/20 1717 11/16/20 2113 11/17/20 0323 11/17/20 0759 11/17/20 1134  GLUCAP 201* 266* 191* 135* 231*   Urine analysis:    Component Value Date/Time   COLORURINE YELLOW 09/15/2020 2055   APPEARANCEUR CLEAR 09/15/2020 2055   APPEARANCEUR Cloudy (A) 06/10/2019 1549   LABSPEC 1.024 09/15/2020 2055   PHURINE 5.0 09/15/2020 2055   GLUCOSEU >=500 (A) 09/15/2020 2055   HGBUR MODERATE (A) 09/15/2020 2055   BILIRUBINUR NEGATIVE 09/15/2020 2055   BILIRUBINUR Negative 06/10/2019 1549   KETONESUR NEGATIVE 09/15/2020 2055   PROTEINUR >=300 (A) 09/15/2020 2055   UROBILINOGEN 0.2 01/26/2014 1618   NITRITE NEGATIVE 09/15/2020 2055   LEUKOCYTESUR NEGATIVE 09/15/2020 2055    Recent Results (from the past 240 hour(s))  SARS CORONAVIRUS 2 (TAT 6-24 HRS) Nasopharyngeal Nasopharyngeal Swab     Status: Abnormal   Collection Time: 11/14/20  1:26 PM   Specimen: Nasopharyngeal Swab  Result Value Ref Range Status   SARS Coronavirus 2 POSITIVE (A) NEGATIVE Final    Comment: (NOTE) SARS-CoV-2 target nucleic acids are DETECTED.  The SARS-CoV-2 RNA is generally detectable in upper and lower respiratory specimens during the acute phase of infection. Positive results are indicative of the presence of SARS-CoV-2 RNA. Clinical correlation with patient history and other diagnostic information is  necessary to determine patient  infection status. Positive results do not rule out bacterial infection or co-infection with other viruses.  The expected result is Negative.  Fact Sheet for Patients: SugarRoll.be  Fact Sheet for Healthcare Providers: https://www.woods-mathews.com/  This test is not yet approved or cleared by the [place] FDA and  has been authorized for detection and/or diagnosis of SARS-CoV-2 by FDA under an Emergency Use Authorization (EUA). This EUA will remain  in effect (meaning this test can be used) for the duration of the COVID-19  declaration under Section 564(b)(1) of the Act, 21 U. S.C. section 360bbb-3(b)(1), unless the authorization is terminated or revoked sooner.   Performed at [facility], [place] [address] [place],  [address]       Studies: DG Elbow 2 Views Right  Result Date: 11/17/2020 CLINICAL DATA:  Pain EXAM: RIGHT ELBOW - 2 VIEW COMPARISON:  None. FINDINGS: There is extensive soft tissue swelling about the elbow. There is no definite acute displaced fracture or dislocation. There appears to be a moderate-sized elbow joint effusion. There is no radiographic evidence for osteomyelitis. There is no radiopaque foreign body. IMPRESSION: 1. No acute displaced fracture or dislocation. 2. There is extensive nonspecific soft tissue swelling about the elbow. Findings could represent cellulitis in the appropriate clinical setting. 3. No radiopaque foreign body. 4. Probable elbow joint effusion. Electronically Signed   By: Constance Holster M.D.   On: 11/17/2020 01:14    Scheduled Meds: . apixaban  5 mg Oral BID  . atorvastatin  80 mg Oral Daily  . busPIRone  7.5 mg Oral BID  . famotidine  20 mg Oral BID  . feeding supplement  237 mL Oral Q breakfast  . feeding supplement (GLUCERNA SHAKE)  237 mL Oral Q24H  . gabapentin  300 mg Oral QHS  . hydrocerin   Topical BID  . insulin aspart  0-5 Units Subcutaneous QHS  . insulin aspart  0-9 Units Subcutaneous TID WC  . insulin aspart  3 Units Subcutaneous TID WC  . insulin glargine  10 Units Subcutaneous QPM  . lidocaine  1 patch Transdermal Q24H  . melatonin  6 mg Oral QHS  . metoprolol succinate  25 mg Oral Daily  . mometasone-formoterol  2 puff Inhalation BID  . nystatin   Topical BID  . potassium chloride  20 mEq Oral BID  . sacubitril-valsartan  1 tablet Oral BID  . sodium chloride flush  3 mL Intravenous Q12H  . torsemide  60 mg Oral BID  . traZODone  150 mg Oral QHS    Continuous Infusions: . sodium chloride       LOS: 26 days      Barton Dubois, MD [facility]  If 7PM-7AM, please contact night-coverage [email or web address] 11/17/2020, 2:05 PM

## 2020-11-17 NOTE — Progress Notes (Signed)
Spiritual care rounding on Mr Dustin Cruz due to length of stay, COVID-19, homelessness.   Mr Dustin Cruz was welcoming of chaplain presence and engaged in conversation.  Our conversation was punctuated with moments of Mr Dustin Cruz falling asleep.  He notes he does not sleep well - describing night anxiety and restlessness.   Provided spiritual support and conversation around discharge planning at bedside.     Mr Dustin Cruz reported discharge tomorrow, and states he does not know his plan.  He states he has contacted a friend to pick him up, but reports this friend might not be reliable to do so.  Attempted to engage Mr Dustin Cruz in alternative planning if this friend does not show.  He states he is unsure what he will do, but "me and God will get there."   Mr Dustin Cruz spoke with chaplain about this faith and described he has made it through much with God at his side.  Chaplain shared prayers with Mr Dustin Cruz related to discharge tomorrow.           Rev. Jerene Pitch, MDiv, [facility] Assistant Director Spiritual Care and [place].

## 2020-11-18 LAB — COMPREHENSIVE METABOLIC PANEL
ALT: 101 U/L — ABNORMAL HIGH (ref 0–44)
AST: 59 U/L — ABNORMAL HIGH (ref 15–41)
Albumin: 2.2 g/dL — ABNORMAL LOW (ref 3.5–5.0)
Alkaline Phosphatase: 198 U/L — ABNORMAL HIGH (ref 38–126)
Anion gap: 7 (ref 5–15)
BUN: 34 mg/dL — ABNORMAL HIGH (ref 6–20)
CO2: 30 mmol/L (ref 22–32)
Calcium: 8.3 mg/dL — ABNORMAL LOW (ref 8.9–10.3)
Chloride: 92 mmol/L — ABNORMAL LOW (ref 98–111)
Creatinine, Ser: 1.39 mg/dL — ABNORMAL HIGH (ref 0.61–1.24)
GFR, Estimated: 60 mL/min — ABNORMAL LOW (ref >60)
Glucose, Bld: 185 mg/dL — ABNORMAL HIGH (ref 70–99)
Potassium: 4.3 mmol/L (ref 3.5–5.1)
Sodium: 129 mmol/L — ABNORMAL LOW (ref 135–145)
Total Bilirubin: 1 mg/dL (ref 0.3–1.2)
Total Protein: 6.7 g/dL (ref 6.5–8.1)

## 2020-11-18 LAB — HEPATITIS PANEL, ACUTE
HCV Ab: NONREACTIVE
Hep A IgM: NONREACTIVE
Hep B C IgM: NONREACTIVE
Hepatitis B Surface Ag: NONREACTIVE

## 2020-11-18 LAB — CBC
HCT: 30.9 % — ABNORMAL LOW (ref 39.0–52.0)
Hemoglobin: 9.8 g/dL — ABNORMAL LOW (ref 13.0–17.0)
MCH: 29.3 pg (ref 26.0–34.0)
MCHC: 31.7 g/dL (ref 30.0–36.0)
MCV: 92.2 fL (ref 80.0–100.0)
Platelets: 529 10*3/uL — ABNORMAL HIGH (ref 150–400)
RBC: 3.35 MIL/uL — ABNORMAL LOW (ref 4.22–5.81)
RDW: 16.1 % — ABNORMAL HIGH (ref 11.5–15.5)
WBC: 10.8 10*3/uL — ABNORMAL HIGH (ref 4.0–10.5)
nRBC: 0 % (ref 0.0–0.2)

## 2020-11-18 LAB — GLUCOSE, CAPILLARY
Glucose-Capillary: 173 mg/dL — ABNORMAL HIGH (ref 70–99)
Glucose-Capillary: 175 mg/dL — ABNORMAL HIGH (ref 70–99)
Glucose-Capillary: 172 mg/dL — ABNORMAL HIGH (ref 70–99)
Glucose-Capillary: 129 mg/dL — ABNORMAL HIGH (ref 70–99)

## 2020-11-18 MED ORDER — POTASSIUM CHLORIDE CRYS ER 20 MEQ PO TBCR: 20.0000 meq | EXTENDED_RELEASE_TABLET | Freq: Two times a day (BID) | ORAL | 2 refills | Status: AC

## 2020-11-18 MED ORDER — DOXYCYCLINE HYCLATE 100 MG PO TABS: 100.0000 mg | ORAL_TABLET | Freq: Two times a day (BID) | ORAL | 0 refills | Status: AC

## 2020-11-18 MED ORDER — SANTYL 250 UNIT/GM EX OINT: 1.0000 "application " | TOPICAL_OINTMENT | Freq: Every day | CUTANEOUS | 0 refills | Status: AC

## 2020-11-18 MED ORDER — FLUCONAZOLE 200 MG PO TABS: 200.0000 mg | ORAL_TABLET | Freq: Every day | ORAL | 0 refills | Status: DC

## 2020-11-18 MED ORDER — DOXYCYCLINE HYCLATE 100 MG PO TABS
100.0000 mg | ORAL_TABLET | Freq: Once | ORAL | Status: AC
  Administered 2020-11-18: 100 mg via ORAL
  Filled 2020-11-18: qty 1

## 2020-11-18 MED ORDER — PANTOPRAZOLE SODIUM 40 MG PO TBEC: 40.0000 mg | DELAYED_RELEASE_TABLET | Freq: Every day | ORAL | 1 refills | Status: AC

## 2020-11-18 MED ORDER — TORSEMIDE 60 MG PO TABS: 60.0000 mg | ORAL_TABLET | Freq: Two times a day (BID) | ORAL | 3 refills | Status: AC

## 2020-11-18 MED ORDER — ALBUTEROL SULFATE HFA 108 (90 BASE) MCG/ACT IN AERS: 2.0000 | INHALATION_SPRAY | Freq: Four times a day (QID) | RESPIRATORY_TRACT | 1 refills | Status: AC | PRN

## 2020-11-18 MED ORDER — GABAPENTIN 300 MG PO CAPS: 300.0000 mg | ORAL_CAPSULE | Freq: Every day | ORAL | 2 refills | Status: AC

## 2020-11-18 MED ORDER — INSULIN ASPART 100 UNIT/ML FLEXPEN: 0.0000 [IU] | PEN_INJECTOR | SUBCUTANEOUS | 11 refills | Status: DC

## 2020-11-18 MED ORDER — CEPHALEXIN 500 MG PO CAPS
500.0000 mg | ORAL_CAPSULE | Freq: Once | ORAL | Status: AC
  Administered 2020-11-18: 500 mg via ORAL
  Filled 2020-11-18: qty 1

## 2020-11-18 MED ORDER — ENTRESTO 24-26 MG PO TABS: 1.0000 | ORAL_TABLET | Freq: Two times a day (BID) | ORAL | 3 refills | Status: AC

## 2020-11-18 MED ORDER — FLUCONAZOLE 100 MG PO TABS
200.0000 mg | ORAL_TABLET | Freq: Once | ORAL | Status: AC
  Administered 2020-11-18: 200 mg via ORAL
  Filled 2020-11-18: qty 2

## 2020-11-18 MED ORDER — NYSTATIN 100000 UNIT/GM EX POWD: Freq: Two times a day (BID) | CUTANEOUS | 1 refills | Status: AC

## 2020-11-18 MED ORDER — INSULIN GLARGINE 100 UNIT/ML ~~LOC~~ SOLN: 12.0000 [IU] | Freq: Every evening | SUBCUTANEOUS | 11 refills | Status: DC

## 2020-11-18 MED ORDER — OXYCODONE HCL 5 MG PO TABS: 5.0000 mg | ORAL_TABLET | Freq: Two times a day (BID) | ORAL | 0 refills | Status: AC | PRN

## 2020-11-18 MED ORDER — DULERA 200-5 MCG/ACT IN AERO: 2.0000 | INHALATION_SPRAY | Freq: Two times a day (BID) | RESPIRATORY_TRACT | 12 refills | Status: AC

## 2020-11-18 MED ORDER — CEPHALEXIN 500 MG PO CAPS: 500.0000 mg | ORAL_CAPSULE | Freq: Two times a day (BID) | ORAL | 0 refills | Status: DC

## 2020-11-18 NOTE — Discharge Instructions (Signed)
1)Avoid ibuprofen/Advil/Aleve/Motrin/Goody Powders/Naproxen/BC powders/Meloxicam/Diclofenac/Indomethacin and other Nonsteroidal anti-inflammatory medications as these will make you more likely to bleed and can cause stomach ulcers, can also cause Kidney problems.   2)insulin aspart (novoLOG) injection 0-10 Units 0-10 Units Subcutaneous, 3 times daily with meals CBG < 70: Implement Hypoglycemia Standing Orders and refer to Hypoglycemia Standing Orders sidebar report  CBG 70 - 120: 0 unit CBG 121 - 150: 0 unit  CBG 151 - 200: 1 unit CBG 201 - 250: 2 units CBG 251 - 300: 4 units CBG 301 - 350: 6 units  CBG 351 - 400: 8 units  CBG > 400: 10 units  3)Repeat CMP (complete metabolic profile) and Repeat CBC within a week advised  4)Santyl ointment to Left foot Medial wound----wound care consult at the rehab facility advised  5) please elevate right upper extremity and  use Ace wrap  6) the results of your acute viral hepatitis profile tests are still pending--- please have your primary care physician follow-up with the results

## 2020-11-18 NOTE — Progress Notes (Signed)
Pt discharged via stretcher by [facility] to [facility] with all personal belongings in his possession. Meds returned from pharmacy and given to EMS personnel with instructions to give bag of meds to nurse upon arrival. Pts friend Ms. Burnette called per pt's request and told of pt's discharge and discharge location.

## 2020-11-18 NOTE — Progress Notes (Signed)
Pt report called to Clayburn Pert, LPN at [facility].

## 2020-11-18 NOTE — Care Management Important Message (Signed)
Important Message  Patient Details  Name: Dustin Cruz MRN: [id number] Date of Birth: 1966-07-25   Medicare Important Message Given:  Yes - Important Message mailed due to current National Emergency     Tommy Medal 11/18/2020, 11:13 AM

## 2020-11-18 NOTE — Discharge Summary (Signed)
Dustin Cruz, is a 55 y.o. male  DOB 17-Nov-1965  MRN [id number].  Admission date:  10/22/2020  Admitting Physician  Dustin Cruz  Discharge Date:  11/18/2020   Primary MD  Dustin Norlander, Cruz  Recommendations for primary care physician for things to follow:   1)Avoid ibuprofen/Advil/Aleve/Motrin/Goody Powders/Naproxen/BC powders/Meloxicam/Diclofenac/Indomethacin and other Nonsteroidal anti-inflammatory medications as these will make you more likely to bleed and can cause stomach ulcers, can also cause Kidney problems.   2)insulin aspart (novoLOG) injection 0-10 Units 0-10 Units Subcutaneous, 3 times daily with meals CBG < 70: Implement Hypoglycemia Standing Orders and refer to Hypoglycemia Standing Orders sidebar report  CBG 70 - 120: 0 unit CBG 121 - 150: 0 unit  CBG 151 - 200: 1 unit CBG 201 - 250: 2 units CBG 251 - 300: 4 units CBG 301 - 350: 6 units  CBG 351 - 400: 8 units  CBG > 400: 10 units  3)Repeat CMP (complete metabolic profile) and Repeat CBC within a week advised  4)Santyl ointment to Left foot Medial wound----wound care consult at the rehab facility advised  5) please elevate right upper extremity and  use Ace wrap  6) the results of your acute viral hepatitis profile tests are still pending--- please have your primary care physician follow-up with the results  Admission Diagnosis  Anasarca [R60.1] Acute on chronic diastolic (congestive) heart failure (HCC) [I50.33]   Discharge Diagnosis  Anasarca [R60.1] Acute on chronic diastolic (congestive) heart failure (HCC) [I50.33]    Active Problems:   Essential hypertension, benign   CKD stage 3 due to type 2 diabetes mellitus (HCC)   Acute on chronic diastolic (congestive) heart failure ([place])   COVID      Past Medical History:  Diagnosis Date  . Anxiety   . Asthma   . CKD stage 3 due to type 2 diabetes mellitus ([place])   . COPD  (chronic obstructive pulmonary disease) ([place])   . Coronary artery disease    s/p BMS to Ramus 10/2010;  Cath 01/22/12 prox 30-40% LAD, LCx ramus w/ hazy 70-80% in-stent restenosis, EF 60% treated medically  . Coronary artery disease    s/p BMS to Ramus 10/2010;  Cath 01/22/12 prox 30-40% LAD, LCx ramus w/ hazy 70-80% in-stent restenosis, EF 60% treated medically   . Depression   . Diabetes mellitus    Type 2  . Gastroparesis 05/2013  . GERD (gastroesophageal reflux disease)   . HTN (hypertension)   . Hyperlipidemia   . Hyperlipidemia   . Major depression, chronic 08/13/2012  . Melanoma ([place]) 2007   surgery at [facility], Followed by [place]  . Myocardial infarction ([place]) 2012  . Obesity   . Tobacco abuse   . Tubular adenoma   . Urinary retention     Past Surgical History:  Procedure Laterality Date  . BIOPSY  01/07/2018   Procedure: BIOPSY;  Surgeon: Dustin Dolin, MD;  [facility];  Service: Endoscopy;;  duodenal and gastric biopsy  . BIOPSY  04/25/2018   Procedure: BIOPSY;  Surgeon: Dustin Dolin, MD;  Location: [facility];  Service: Endoscopy;;  ascending and descending and sigmoid biopsies  . CARDIAC CATHETERIZATION     with stent  . COLONOSCOPY N/A 01/27/2013   TIR:WERXVQ and colonic polyps. Tubular adenomas, poor bowel prep, one-year follow-up surveillance colonoscopy recommended  . COLONOSCOPY WITH PROPOFOL N/A 04/25/2018   Procedure: COLONOSCOPY WITH PROPOFOL;  Surgeon: Dustin Dolin, MD;  Location: [facility];  Service: Endoscopy;  Laterality: N/A;  11:00am - pt to be prepped INPT and labs to be done as well  . CORONARY ANGIOPLASTY WITH STENT PLACEMENT    . ESOPHAGOGASTRODUODENOSCOPY  04/24/2012   [place]k-mild erosive reflux esophagitis,dilated w/107F Venia Minks, small HH, minimal chronic gastric/bulbar erosions(No H pylori)  . ESOPHAGOGASTRODUODENOSCOPY (EGD) WITH PROPOFOL N/A 01/07/2018   Dr. Gala Cruz: Erythematous mucosa in the stomach, retained gastric contents,  incomplete exam.  Biopsied showed mild chronic gastritis but no H. pylori.  Duodenal biopsy was negative for celiac disease.  [name] FLEXIBLE SIGMOIDOSCOPY N/A 01/07/2018   Dr. Gala Cruz: Incomplete colonoscopy due to inadequate bowel prep  . LEFT HEART CATHETERIZATION WITH CORONARY ANGIOGRAM N/A 01/22/2012   Procedure: LEFT HEART CATHETERIZATION WITH CORONARY ANGIOGRAM;  Surgeon: Dustin Artist, MD;  Location: [facility] CATH LAB;  Service: Cardiovascular;  Laterality: N/A;  . melanoma surgery  2007   [place], removed lymph nodes under arm as well,Left abd      HPI  from the history and physical done on the day of admission:    Chief Complaint: Worsening lower extremity and abdominal swelling  HPI: Dustin Cruz is a 55 y.o. male with medical history significant for CAD, type 2 diabetes, diastolic heart failure, GERD, and COPD who presented to the ED with worsening lower extremity edema as well as abdominal edema and scrotal edema as well as some weakness.  He is currently homeless and lives in his car.  He states that he had run out of his Lasix as well as his diabetes medication for the last few days.  It appears that on last admission on 09/2020 that he left AMA as he was being treated for edema at that time.  He denies any fever, chills, abdominal pain, nausea, vomiting, chest pain, or other concerns.   ED Course: Patient noted to have stable vital signs, but initial glucose is over 500.  BNP is 1662 and creatinine is 1.38 with baseline near 1.1.  Chest x-ray with no significant abnormalities noted and EKG was sinus rhythm at 95 bpm.  He has been given some IV Lasix and IV insulin in the ED.  Review of Systems: Reviewed as noted above, otherwise negative.     Hospital Course:      HPI/Recap of past 24 hours: HPI from Dr Dustin Cruz a 55 y.o.male admitted with significant volume overload/anasarca in the setting of acute on chronic diastolic CHF exacerbation with poor compliance on home  medications.  Cardiology was consulted, patient was adequately diuresed with significant improvement.  Patient was incidentally found to be Covid positive while performing routine testing to discharge to SNF.  Remains asymptomatic.  Plan is to discharge to SNF as pt is homeless  Today (11/17/20): No chest pain, no nausea, no vomiting, no shortness of breath.  Patient is not requiring oxygen supplementation and is currently afebrile.  Still with trace edema appreciated bilaterally.  No signs of superimposed infection on his left lower extremity wounds.  Feeling weak, tired and deconditioned.  No overnight events.   Assessment/Plan: Active Problems:   CKD stage 3 due to type 2 diabetes mellitus (HCC)   Essential hypertension, benign   Acute on chronic diastolic (congestive) heart failure (HCC)   COVID   Significant volume overload with anasarca/acute on chronic combined systolic and diastolic HF -Improved/stable -Last 2D echocardiogram performed on10/16/2021 with LVEF 60-65% and grade 3 diastolic dysfunction, repeated Echo 1/18 with LVEF 35-40% -Cardiology consulted, signed off, recommend p.o. torsemide 60 mg twice daily --started Entresto -Hypoalbuminemia may also be playing a role in overall edema, received albumin infusion 10/28/2020.  -Weight is down to 208 pounds from 258 pounds this admission  CAD/hyperlipidemia -Currently denies shortness of breath  -Heart cath in 2013,  - medical management was recommended due to small vessel size -Continue the use of Lipitor and Toprol -Chest pain-free, asymptomatic  Wide-Complex Tachycardia/Possible Atypical Atrial Flutter -Cardiology consulted, not felt to be a candidate for an ICD given his medication noncompliance and noncompliance with visits as an outpatient. -Continue Toprol-XL 25mg  daily, Eliquis 5mg  BID (was on prior to admission given history of a DVT). -Patient denies palpitations; noncompliant with telemetry in  house.  Diabetes mellitus type 2 -A1c on 09/2020 was 14, reflecting uncontrolled DM with hyperglycemia -Very noncompliant (ordering food/pizza from outside restaurants) -Continue SSI, lantus, Accu-Cheks, hypoglycemic protocol;   LLE wound -Appears to be chronic -Continue daily dressing and wound care. - -Possible cellulitis--Right upper extremity post fall abrasion and bilateral lower extremity wounds left on the right--- --Left foot medial aspect with unstageable wound with some necrotic eschar--- will need Santyl for enzymatic debridement  empirically treat with doxycycline and Keflex for possible superimposed cellulitis -No fevers, no concerns for sepsis  CKD stage IIIa -Creatinine at baseline--currently 1.39 =-Repeat CMP in 1 week advised  Incidental COVID-19 infection -Asymptomatic, needed 10 days of isolation -Continue supportive care.   COPD -Stable currently no wheezing. -Continue Dulera, and as needed rescue inhalers.    GERD -Continue famotidine   Malnutrition Type:  Nutrition Problem: Food and nutrition related knowledge deficit Etiology: limited prior education (CHF)  Malnutrition Characteristics:  Signs/Symptoms: per patient/family report  Estimated body mass index is 26.81 kg/m as calculated from the following:   Height as of this encounter: 6\' 2"  (1.88 m).   Weight as of this encounter: 94.7 kg.    -Elevated LFTs--acute viral otitis profile advised, repeat CMP within a week advised  Code Status: Full  Family Communication: Discussed with significant other   Dispo: The patient is from: Home (living with friends, homeless)  Anticipated d/c is to: SNF    Consultants:  Cardiology  Procedures:  None  Antimicrobials:  None  DVT prophylaxis: Eliquis  Discharge Condition: Stable  Follow UP   Contact information for follow-up providers    [place] Follow up.   Why:  Call after discharge to follow up with housing options. Contact information: [address] [place], [place] [address]  Open: 9AM-5PM, Monday-Friday Phone: [phone]       Erma Heritage, PA-C Follow up on 12/02/2020.   Specialties: Physician Assistant, Cardiology Why: Cardiology Hospital Follow-up on 12/02/2020 at 3:30 PM.  Contact information: [address] [place]  [phone]            Contact information for after-discharge care    [facility] .   Service: Skilled Chiropodist information: [address] [place] [phone]  Diet and Activity recommendation:  As advised  Discharge Instructions    Discharge Instructions    Diet - low sodium heart healthy   Complete by: As directed    Discharge instructions   Complete by: As directed    1)Avoid ibuprofen/Advil/Aleve/Motrin/Goody Powders/Naproxen/BC powders/Meloxicam/Diclofenac/Indomethacin and other Nonsteroidal anti-inflammatory medications as these will make you more likely to bleed and can cause stomach ulcers, can also cause Kidney problems.   2)insulin aspart (novoLOG) injection 0-10 Units 0-10 Units Subcutaneous, 3 times daily with meals CBG < 70: Implement Hypoglycemia Standing Orders and refer to Hypoglycemia Standing Orders sidebar report  CBG 70 - 120: 0 unit CBG 121 - 150: 0 unit  CBG 151 - 200: 1 unit CBG 201 - 250: 2 units CBG 251 - 300: 4 units CBG 301 - 350: 6 units  CBG 351 - 400: 8 units  CBG > 400: 10 units  3)Repeat CMP (complete metabolic profile) and Repeat CBC within a week advised  4)Santyl ointment to Left foot Medial wound----wound care consult at the rehab facility advised  5) please elevate right upper extremity and  use Ace wrap  6) the results of your acute viral hepatitis profile tests are still pending--- please have your primary care physician follow-up with the results    Increase activity slowly   Complete by: As directed         Discharge Medications     Allergies as of 11/18/2020   No Known Allergies     Medication List    STOP taking these medications   famotidine 20 MG tablet Commonly known as: PEPCID   furosemide 80 MG tablet Commonly known as: LASIX   Lantus SoloStar 100 UNIT/ML Solostar Pen Generic drug: insulin glargine Replaced by: insulin glargine 100 UNIT/ML injection   metFORMIN 500 MG tablet Commonly known as: Glucophage   nitroGLYCERIN 0.4 MG SL tablet Commonly known as: NITROSTAT   potassium chloride 10 MEQ tablet Commonly known as: KLOR-CON Replaced by: potassium chloride SA 20 MEQ tablet   tiZANidine 4 MG tablet Commonly known as: ZANAFLEX     TAKE these medications   acetaminophen 325 MG tablet Commonly known as: TYLENOL Take 2 tablets (650 mg total) by mouth every 6 (six) hours as needed for mild pain, moderate pain or fever (or Fever >/= 101).   albuterol (2.5 MG/3ML) 0.083% nebulizer solution Commonly known as: PROVENTIL Take 3 mLs (2.5 mg total) by nebulization every 6 (six) hours as needed for wheezing or shortness of breath. Dx---44.1 What changed: Another medication with the same name was changed. Make sure you understand how and when to take each.   albuterol 108 (90 Base) MCG/ACT inhaler Commonly known as: VENTOLIN HFA Inhale 2 puffs into the lungs every 6 (six) hours as needed for wheezing or shortness of breath. INHALE 2 PUFFS BY MOUTH EVERY 6 HOURS AS NEEDED What changed: See the new instructions.   aspirin EC 81 MG tablet Take 1 tablet (81 mg total) by mouth daily with breakfast.   atorvastatin 80 MG tablet Commonly known as: LIPITOR Take 1 tablet (80 mg total) by mouth daily.   busPIRone 7.5 MG tablet Commonly known as: BUSPAR Take 1 tablet by mouth twice daily   cephALEXin 500 MG capsule Commonly known as: KEFLEX Take 1 capsule (500 mg total) by mouth 2 (two) times daily for 7  days. What changed:   medication strength  how much to take  when to take this   doxycycline 100 MG tablet Commonly  known as: VIBRA-TABS Take 1 tablet (100 mg total) by mouth 2 (two) times daily for 7 days.   Dulera 200-5 MCG/ACT Aero Generic drug: mometasone-formoterol Inhale 2 puffs into the lungs in the morning and at bedtime.   Eliquis 5 MG Tabs tablet Generic drug: apixaban Take 1 tablet by mouth twice daily   Entresto 24-26 MG Generic drug: sacubitril-valsartan Take 1 tablet by mouth 2 (two) times daily.   fluconazole 200 MG tablet Commonly known as: Diflucan Take 1 tablet (200 mg total) by mouth daily for 2 days. Start taking on: November 19, 2020   gabapentin 300 MG capsule Commonly known as: NEURONTIN Take 1 capsule (300 mg total) by mouth at bedtime.   insulin aspart 100 UNIT/ML FlexPen Commonly known as: NOVOLOG Inject 0-10 Units into the skin See admin instructions. insulin aspart (novoLOG) injection 0-10 Units 0-10 Units Subcutaneous, 3 times daily with meals CBG < 70: Implement Hypoglycemia Standing Orders and refer to Hypoglycemia Standing Orders sidebar report  CBG 70 - 120: 0 unit CBG 121 - 150: 0 unit  CBG 151 - 200: 1 unit CBG 201 - 250: 2 units CBG 251 - 300: 4 units CBG 301 - 350: 6 units  CBG 351 - 400: 8 units  CBG > 400: 10 units   insulin glargine 100 UNIT/ML injection Commonly known as: LANTUS Inject 0.12 mLs (12 Units total) into the skin every evening. Replaces: Lantus SoloStar 100 UNIT/ML Solostar Pen   lisinopril 10 MG tablet Commonly known as: ZESTRIL Take 1 tablet (10 mg total) by mouth at bedtime.   metoprolol succinate 25 MG 24 hr tablet Commonly known as: TOPROL-XL Take 12.5 mg by mouth daily.   nystatin powder Commonly known as: MYCOSTATIN/NYSTOP Apply topically 2 (two) times daily.   oxyCODONE 5 MG immediate release tablet Commonly known as: Oxy IR/ROXICODONE Take 1 tablet (5 mg total) by mouth 2 (two) times daily as needed  for up to 5 days for moderate pain. What changed: reasons to take this   pantoprazole 40 MG tablet Commonly known as: Protonix Take 1 tablet (40 mg total) by mouth daily.   potassium chloride SA 20 MEQ tablet Commonly known as: KLOR-CON Take 1 tablet (20 mEq total) by mouth 2 (two) times daily. Replaces: potassium chloride 10 MEQ tablet   Santyl ointment Generic drug: collagenase Apply 1 application topically daily.   Torsemide 60 MG Tabs Take 60 mg by mouth 2 (two) times daily.   traZODone 150 MG tablet Commonly known as: DESYREL TAKE 1 TABLET BY MOUTH ONCE DAILY AT BEDTIME       Major procedures and Radiology Reports - PLEASE review detailed and final reports for all details, in brief -   DG Chest 1 View  Result Date: 10/26/2020 CLINICAL DATA:  Leg swelling, shortness of breath, negative COVID EXAM: CHEST  1 VIEW COMPARISON:  10/22/2020 FINDINGS: Mild cardiomegaly and pulmonary vascular prominence. No overt airspace opacity. The visualized skeletal structures are unremarkable. IMPRESSION: Mild cardiomegaly and pulmonary vascular prominence. No overt pulmonary edema or other airspace opacity. Electronically Signed   By: Eddie Candle M.D.   On: 10/26/2020 13:12   DG Elbow 2 Views Right  Result Date: 11/17/2020 CLINICAL DATA:  Pain EXAM: RIGHT ELBOW - 2 VIEW COMPARISON:  None. FINDINGS: There is extensive soft tissue swelling about the elbow. There is no definite acute displaced fracture or dislocation. There appears to be a moderate-sized elbow joint effusion. There is no radiographic evidence for osteomyelitis. There is no  radiopaque foreign body. IMPRESSION: 1. No acute displaced fracture or dislocation. 2. There is extensive nonspecific soft tissue swelling about the elbow. Findings could represent cellulitis in the appropriate clinical setting. 3. No radiopaque foreign body. 4. Probable elbow joint effusion. Electronically Signed   By: Constance Holster M.D.   On: 11/17/2020  01:14   DG ELBOW COMPLETE LEFT (3+VIEW)  Result Date: 11/05/2020 CLINICAL DATA:  Elbow pain with swelling history of fall EXAM: LEFT ELBOW - COMPLETE 3+ VIEW COMPARISON:  06/02/2013 FINDINGS: Assessment for radial head fracture is limited due to positioning, there is questionable minimal deformity of the radial head. No dislocation. Generalized soft tissue swelling. IMPRESSION: Questionable minimal deformity of the radial head, assessment of the radial head is limited by positioning. There is considerable soft tissue swelling. Electronically Signed   By: Donavan Foil M.D.   On: 11/05/2020 18:14   DG Chest Port 1 View  Result Date: 10/22/2020 CLINICAL DATA:  Shortness of breath EXAM: PORTABLE CHEST 1 VIEW COMPARISON:  September 28, 2020 FINDINGS: There is mild atelectatic change in the left lower lobe region. There is no edema or consolidation. Heart is upper normal in size with pulmonary vascularity normal. No adenopathy. No bone lesions. IMPRESSION: Mild left lower lobe atelectatic change. Lungs otherwise clear. Stable cardiac silhouette. Electronically Signed   By: Lowella Grip III M.D.   On: 10/22/2020 10:01   ECHOCARDIOGRAM COMPLETE  Result Date: 10/26/2020    ECHOCARDIOGRAM REPORT   Patient Name:   EUELL SCHIFF Date of Exam: 10/26/2020 Medical Rec #:  [id number]    Height:       74.0 in Accession #:    [id number]   Weight:       257.0 lb Date of Birth:  January 09, 1966     BSA:          2.418 m Patient Age:    33 years     BP:           137/88 mmHg Patient Gender: M            HR:           50 bpm. Exam Location:  [facility] Procedure: 2D Echo, Cardiac Doppler and Color Doppler Indications:    Abnormal ECG R94.31  History:        Patient has prior history of Echocardiogram examinations, most                 recent 07/24/2020. CHF, CAD, COPD; Risk Factors:Hypertension,                 Diabetes, Dyslipidemia and Current Smoker.  Sonographer:    Alvino Chapel RCS Referring Phys: [id number] Floris D [employer]  1. Left ventricular ejection fraction, by estimation, is 35 to 40%. The left ventricle has moderately decreased function. The left ventricle demonstrates global hypokinesis. There is mild left ventricular hypertrophy. Left ventricular diastolic parameters are indeterminate.  2. Right ventricular systolic function is mildly reduced. The right ventricular size is moderately enlarged. There is moderately elevated pulmonary artery systolic pressure.  3. The mitral valve is normal in structure. Mild mitral valve regurgitation. No evidence of mitral stenosis.  4. The aortic valve is tricuspid. There is mild calcification of the aortic valve. There is mild thickening of the aortic valve. Aortic valve regurgitation is not visualized. No aortic stenosis is present.  5. Moderate pulmonary HTN, PASP is 54 mmHg.  6. The inferior vena cava is dilated in size with <  50% respiratory variability, suggesting right atrial pressure of 15 mmHg. FINDINGS  Left Ventricle: Left ventricular ejection fraction, by estimation, is 35 to 40%. The left ventricle has moderately decreased function. The left ventricle demonstrates global hypokinesis. The left ventricular internal cavity size was normal in size. There is mild left ventricular hypertrophy. Left ventricular diastolic parameters are indeterminate. Right Ventricle: The right ventricular size is moderately enlarged. No increase in right ventricular wall thickness. Right ventricular systolic function is mildly reduced. There is moderately elevated pulmonary artery systolic pressure. The tricuspid regurgitant velocity is 3.14 m/s, and with an assumed right atrial pressure of 15 mmHg, the estimated right ventricular systolic pressure is 02.4 mmHg. Left Atrium: Left atrial size was normal in size. Right Atrium: Right atrial size was normal in size. Pericardium: Trivial pericardial effusion is present. Mitral Valve: The mitral valve is normal in structure. Mild mitral valve  regurgitation. No evidence of mitral valve stenosis. Tricuspid Valve: The tricuspid valve is normal in structure. Tricuspid valve regurgitation is mild . No evidence of tricuspid stenosis. Aortic Valve: The aortic valve is tricuspid. There is mild calcification of the aortic valve. There is mild thickening of the aortic valve. There is mild aortic valve annular calcification. Aortic valve regurgitation is not visualized. No aortic stenosis  is present. Pulmonic Valve: The pulmonic valve was not well visualized. Pulmonic valve regurgitation is not visualized. No evidence of pulmonic stenosis. Aorta: The aortic root is normal in size and structure. Pulmonary Artery: Moderate pulmonary HTN, PASP is 54 mmHg. Venous: The inferior vena cava is dilated in size with less than 50% respiratory variability, suggesting right atrial pressure of 15 mmHg. IAS/Shunts: No atrial level shunt detected by color flow Doppler.  LEFT VENTRICLE PLAX 2D LVIDd:         4.90 cm LVIDs:         4.50 cm LV PW:         1.30 cm LV IVS:        1.10 cm LVOT diam:     1.90 cm LV SV:         46 LV SV Index:   19 LVOT Area:     2.84 cm  LV Volumes (MOD) LV vol d, MOD A2C: 90.9 ml LV vol d, MOD A4C: 93.3 ml LV vol s, MOD A2C: 69.6 ml LV vol s, MOD A4C: 50.7 ml LV SV MOD A2C:     21.3 ml LV SV MOD A4C:     93.3 ml LV SV MOD BP:      32.4 ml RIGHT VENTRICLE TAPSE (M-mode): 1.4 cm LEFT ATRIUM             Index       RIGHT ATRIUM           Index LA diam:        3.00 cm 1.24 cm/m  RA Area:     20.20 cm LA Vol (A2C):   74.2 ml 30.69 ml/m RA Volume:   65.80 ml  27.21 ml/m LA Vol (A4C):   50.6 ml 20.93 ml/m LA Biplane Vol: 62.1 ml 25.68 ml/m  AORTIC VALVE LVOT Vmax:   98.30 cm/s LVOT Vmean:  66.100 cm/s LVOT VTI:    0.164 m  AORTA Ao Root diam: 3.30 cm MITRAL VALVE                TRICUSPID VALVE MV Area (PHT): 6.17 cm     TR Peak grad:   39.4 mmHg MV Decel  Time: 123 msec     TR Vmax:        314.00 cm/s MV E velocity: 138.00 cm/s                              SHUNTS                             Systemic VTI:  0.16 m                             Systemic Diam: 1.90 cm Carlyle Dolly MD Electronically signed by Carlyle Dolly MD Signature Date/Time: 10/26/2020/4:43:29 PM    Final     Micro Results   Recent Results (from the past 240 hour(s))  SARS CORONAVIRUS 2 (TAT 6-24 HRS) Nasopharyngeal Nasopharyngeal Swab     Status: Abnormal   Collection Time: 11/14/20  1:26 PM   Specimen: Nasopharyngeal Swab  Result Value Ref Range Status   SARS Coronavirus 2 POSITIVE (A) NEGATIVE Final    Comment: (NOTE) SARS-CoV-2 target nucleic acids are DETECTED.  The SARS-CoV-2 RNA is generally detectable in upper and lower respiratory specimens during the acute phase of infection. Positive results are indicative of the presence of SARS-CoV-2 RNA. Clinical correlation with patient history and other diagnostic information is  necessary to determine patient infection status. Positive results Cruz not rule out bacterial infection or co-infection with other viruses.  The expected result is Negative.  Fact Sheet for Patients: SugarRoll.be  Fact Sheet for Healthcare Providers: https://www.woods-mathews.com/  This test is not yet approved or cleared by the [place] FDA and  has been authorized for detection and/or diagnosis of SARS-CoV-2 by FDA under an Emergency Use Authorization (EUA). This EUA will remain  in effect (meaning this test can be used) for the duration of the COVID-19 declaration under Section 564(b)(1) of the Act, 21 U. S.C. section 360bbb-3(b)(1), unless the authorization is terminated or revoked sooner.   Performed at [facility], [place] [address], [place], [place] [address]     Today   Subjective    Damonta Cossey today has no new complaints No fever  Or chills   No Nausea, Vomiting or Diarrhea --Voiding well          Patient has been seen and examined prior to discharge   Objective    Blood pressure 112/69, pulse 96, temperature 97.8 F (36.6 C), temperature source Oral, resp. rate 16, height 6\' 2"  (1.88 m), weight 94.7 kg, SpO2 97 %.   Intake/Output Summary (Last 24 hours) at 11/18/2020 1129 Last data filed at 11/18/2020 0300 Gross per 24 hour  Intake 840 ml  Output --  Net 840 ml    Exam Gen:- Awake Alert, no acute distress, speaking in complete sentences HEENT:- [place].AT, No sclera icterus Neck-Supple Neck,No JVD,.  Lungs-  CTAB , good air movement bilaterally  CV- S1, S2 normal, regular Abd-  +ve B.Sounds, Abd Soft, No tenderness,    Extremity/Skin:-Right upper extremity abrasion and swelling post fall, bilateral lower extremity swelling and open wounds--- some erythema but no streaking and no purulent drainage -Left foot medial aspect with unstageable wound with some necrotic eschar--- will need Santyl for enzymatic debridement psych-affect is appropriate, oriented x3 Neuro-no new focal deficits, no tremors    Data Review   CBC w Diff:  Lab Results  Component Value  Date   WBC 10.8 (H) 11/18/2020   HGB 9.8 (L) 11/18/2020   HGB 13.7 07/20/2020   HCT 30.9 (L) 11/18/2020   HCT 42.3 07/20/2020   PLT 529 (H) 11/18/2020   PLT 328 07/20/2020   LYMPHOPCT 12 10/22/2020   MONOPCT 15 10/22/2020   EOSPCT 1 10/22/2020   BASOPCT 1 10/22/2020    CMP:  Lab Results  Component Value Date   NA 129 (L) 11/18/2020   NA 129 (L) 07/20/2020   K 4.3 11/18/2020   CL 92 (L) 11/18/2020   CO2 30 11/18/2020   BUN 34 (H) 11/18/2020   BUN 13 07/20/2020   CREATININE 1.39 (H) 11/18/2020   CREATININE 0.97 05/07/2013   PROT 6.7 11/18/2020   PROT 5.9 (L) 06/25/2020   ALBUMIN 2.2 (L) 11/18/2020   ALBUMIN 2.9 (L) 07/20/2020   BILITOT 1.0 11/18/2020   BILITOT 0.6 06/25/2020   ALKPHOS 198 (H) 11/18/2020   AST 59 (H) 11/18/2020   ALT 101 (H) 11/18/2020  .   Total Discharge time is about 33 minutes  Roxan Hockey M.D on 11/18/2020 at 11:29 AM  Go to [email or web address] -   for contact info  [facility] - Office  [phone]

## 2020-11-18 NOTE — Progress Notes (Signed)
Nutrition Follow-up  DOCUMENTATION CODES:   Obesity unspecified  INTERVENTION:  Continue Ensure Max po daily, each supplement provides 150 kcal and 30 grams of protein  Continue Glucerna po daily, each supplement provides 220 kcal and 10 grams of protein  Low Na Diet Ed provided during admission  NUTRITION DIAGNOSIS:   Food and nutrition related knowledge deficit related to limited prior education (CHF) as evidenced by per patient/family report. -ongoing  GOAL:    (Patient will follow low sodium /CHO modifed diet) -met  MONITOR:   PO intake,Weight trends,I & O's,Labs,Skin  REASON FOR ASSESSMENT:   Consult Diet education  ASSESSMENT:   Patient is a 55 yo male with hx of DM-2, CHF, CKD-3, COPD, Chronic depression, CAD. Patient is homeless and has been living in his car. He presents with volume overload, non-compliance. Acute on chronic CHF.  Admit 1/14 with significant volume overload with anasarca secondary to acute on chronic combined CHF.   Pt continues to have excellent appetite and intake, eating 100% of all meals as well as accepting Ensure and Glucerna supplements. Glucerna daily added by MD on 2/8 due to hypoalbuminemia.   Admit wt 114.5 kg     Current wt 94.7 kg I/Os: -3230 ml since admit  Plans to discharge to SNF today  Medications reviewed and include: Pepcid, Gabapentin, SSI, Lantus 10 units daily, Melatonin, Klor-con, Enestro, Demadex 60 mg twice daily  Labs: CBGs 172,175,173,270, Na 129 (L), BUN 34 (H), Cr 1.39 (H)   Diet Order:   Diet Order            Diet - low sodium heart healthy           Diet heart healthy/carb modified Room service appropriate? Yes; Fluid consistency: Thin; Fluid restriction: 1500 mL Fluid  Diet effective now                 EDUCATION NEEDS:   Education needs have been addressed  Skin:  Skin Assessment: Reviewed RN Assessment  Last BM:  1/29  Height:   Ht Readings from Last 1 Encounters:  11/08/20 '6\' 2"'   (1.88 m)    Weight:   Wt Readings from Last 1 Encounters:  11/17/20 94.7 kg   BMI:  Body mass index is 26.81 kg/m.  Estimated Nutritional Needs:   Kcal:  2074-2281  Protein:  112-120 gr  Fluid:  1500 ml daily per MD   Lajuan Lines, RD, LDN Clinical Nutrition After Hours/Weekend Pager # in [place]

## 2020-11-20 ENCOUNTER — Inpatient Hospital Stay (HOSPITAL_COMMUNITY)
Admission: EM | Admit: 2020-11-20 | Discharge: 2020-11-24 | DRG: 291 | Disposition: A | Discharge status: Skilled Nursing Facility | Payer: 59 | Source: Skilled Nursing Facility | Attending: Internal Medicine | Admitting: Internal Medicine

## 2020-11-20 ENCOUNTER — Other Ambulatory Visit: Payer: Self-pay

## 2020-11-20 ENCOUNTER — Emergency Department (HOSPITAL_COMMUNITY): Payer: 59

## 2020-11-20 ENCOUNTER — Encounter (HOSPITAL_COMMUNITY): Payer: Self-pay | Admitting: *Deleted

## 2020-11-20 DIAGNOSIS — L03115 Cellulitis of right lower limb: Secondary | ICD-10-CM | POA: Diagnosis present

## 2020-11-20 DIAGNOSIS — I130 Hypertensive heart and chronic kidney disease with heart failure and stage 1 through stage 4 chronic kidney disease, or unspecified chronic kidney disease: Principal | ICD-10-CM | POA: Diagnosis present

## 2020-11-20 DIAGNOSIS — I248 Other forms of acute ischemic heart disease: Secondary | ICD-10-CM | POA: Diagnosis present

## 2020-11-20 DIAGNOSIS — Z79891 Long term (current) use of opiate analgesic: Secondary | ICD-10-CM

## 2020-11-20 DIAGNOSIS — E1122 Type 2 diabetes mellitus with diabetic chronic kidney disease: Secondary | ICD-10-CM | POA: Diagnosis present

## 2020-11-20 DIAGNOSIS — F17210 Nicotine dependence, cigarettes, uncomplicated: Secondary | ICD-10-CM | POA: Diagnosis present

## 2020-11-20 DIAGNOSIS — Z8616 Personal history of COVID-19: Secondary | ICD-10-CM

## 2020-11-20 DIAGNOSIS — N183 Chronic kidney disease, stage 3 unspecified: Secondary | ICD-10-CM | POA: Diagnosis present

## 2020-11-20 DIAGNOSIS — I252 Old myocardial infarction: Secondary | ICD-10-CM | POA: Diagnosis not present

## 2020-11-20 DIAGNOSIS — Z85820 Personal history of malignant melanoma of skin: Secondary | ICD-10-CM

## 2020-11-20 DIAGNOSIS — F05 Delirium due to known physiological condition: Secondary | ICD-10-CM | POA: Diagnosis present

## 2020-11-20 DIAGNOSIS — I2510 Atherosclerotic heart disease of native coronary artery without angina pectoris: Secondary | ICD-10-CM | POA: Diagnosis present

## 2020-11-20 DIAGNOSIS — Z79899 Other long term (current) drug therapy: Secondary | ICD-10-CM

## 2020-11-20 DIAGNOSIS — E1165 Type 2 diabetes mellitus with hyperglycemia: Secondary | ICD-10-CM | POA: Diagnosis present

## 2020-11-20 DIAGNOSIS — I509 Heart failure, unspecified: Secondary | ICD-10-CM

## 2020-11-20 DIAGNOSIS — R778 Other specified abnormalities of plasma proteins: Secondary | ICD-10-CM

## 2020-11-20 DIAGNOSIS — J449 Chronic obstructive pulmonary disease, unspecified: Secondary | ICD-10-CM | POA: Diagnosis present

## 2020-11-20 DIAGNOSIS — Z6826 Body mass index (BMI) 26.0-26.9, adult: Secondary | ICD-10-CM

## 2020-11-20 DIAGNOSIS — I5043 Acute on chronic combined systolic (congestive) and diastolic (congestive) heart failure: Secondary | ICD-10-CM | POA: Diagnosis present

## 2020-11-20 DIAGNOSIS — Z86718 Personal history of other venous thrombosis and embolism: Secondary | ICD-10-CM | POA: Diagnosis not present

## 2020-11-20 DIAGNOSIS — K3184 Gastroparesis: Secondary | ICD-10-CM | POA: Diagnosis present

## 2020-11-20 DIAGNOSIS — Z794 Long term (current) use of insulin: Secondary | ICD-10-CM

## 2020-11-20 DIAGNOSIS — N1832 Chronic kidney disease, stage 3b: Secondary | ICD-10-CM | POA: Diagnosis present

## 2020-11-20 DIAGNOSIS — I10 Essential (primary) hypertension: Secondary | ICD-10-CM

## 2020-11-20 DIAGNOSIS — Z9114 Patient's other noncompliance with medication regimen: Secondary | ICD-10-CM | POA: Diagnosis not present

## 2020-11-20 DIAGNOSIS — Z7982 Long term (current) use of aspirin: Secondary | ICD-10-CM

## 2020-11-20 DIAGNOSIS — E11319 Type 2 diabetes mellitus with unspecified diabetic retinopathy without macular edema: Secondary | ICD-10-CM | POA: Diagnosis present

## 2020-11-20 DIAGNOSIS — E1143 Type 2 diabetes mellitus with diabetic autonomic (poly)neuropathy: Secondary | ICD-10-CM | POA: Diagnosis present

## 2020-11-20 DIAGNOSIS — L03116 Cellulitis of left lower limb: Secondary | ICD-10-CM | POA: Diagnosis present

## 2020-11-20 DIAGNOSIS — E785 Hyperlipidemia, unspecified: Secondary | ICD-10-CM | POA: Diagnosis present

## 2020-11-20 DIAGNOSIS — K219 Gastro-esophageal reflux disease without esophagitis: Secondary | ICD-10-CM | POA: Diagnosis present

## 2020-11-20 DIAGNOSIS — Z7951 Long term (current) use of inhaled steroids: Secondary | ICD-10-CM

## 2020-11-20 DIAGNOSIS — D631 Anemia in chronic kidney disease: Secondary | ICD-10-CM | POA: Diagnosis present

## 2020-11-20 DIAGNOSIS — E669 Obesity, unspecified: Secondary | ICD-10-CM | POA: Diagnosis present

## 2020-11-20 DIAGNOSIS — F419 Anxiety disorder, unspecified: Secondary | ICD-10-CM | POA: Diagnosis present

## 2020-11-20 DIAGNOSIS — Z955 Presence of coronary angioplasty implant and graft: Secondary | ICD-10-CM

## 2020-11-20 DIAGNOSIS — Z7901 Long term (current) use of anticoagulants: Secondary | ICD-10-CM

## 2020-11-20 DIAGNOSIS — R609 Edema, unspecified: Secondary | ICD-10-CM | POA: Diagnosis not present

## 2020-11-20 HISTORY — DX: Heart failure, unspecified: I50.9

## 2020-11-20 LAB — CBC WITH DIFFERENTIAL/PLATELET
Abs Immature Granulocytes: 0.02 10*3/uL (ref 0.00–0.07)
Basophils Absolute: 0.1 10*3/uL (ref 0.0–0.1)
Basophils Relative: 1 %
Eosinophils Absolute: 0 10*3/uL (ref 0.0–0.5)
Eosinophils Relative: 0 %
HCT: 29.5 % — ABNORMAL LOW (ref 39.0–52.0)
Hemoglobin: 9.3 g/dL — ABNORMAL LOW (ref 13.0–17.0)
Immature Granulocytes: 0 %
Lymphocytes Relative: 11 %
Lymphs Abs: 1 10*3/uL (ref 0.7–4.0)
MCH: 28.8 pg (ref 26.0–34.0)
MCHC: 31.5 g/dL (ref 30.0–36.0)
MCV: 91.3 fL (ref 80.0–100.0)
Monocytes Absolute: 1 10*3/uL (ref 0.1–1.0)
Monocytes Relative: 11 %
Neutro Abs: 6.8 10*3/uL (ref 1.7–7.7)
Neutrophils Relative %: 77 %
Platelets: 455 10*3/uL — ABNORMAL HIGH (ref 150–400)
RBC: 3.23 MIL/uL — ABNORMAL LOW (ref 4.22–5.81)
RDW: 16.1 % — ABNORMAL HIGH (ref 11.5–15.5)
WBC: 8.9 10*3/uL (ref 4.0–10.5)
nRBC: 0 % (ref 0.0–0.2)

## 2020-11-20 LAB — COMPREHENSIVE METABOLIC PANEL
ALT: 77 U/L — ABNORMAL HIGH (ref 0–44)
AST: 44 U/L — ABNORMAL HIGH (ref 15–41)
Albumin: 2.2 g/dL — ABNORMAL LOW (ref 3.5–5.0)
Alkaline Phosphatase: 175 U/L — ABNORMAL HIGH (ref 38–126)
Anion gap: 8 (ref 5–15)
BUN: 34 mg/dL — ABNORMAL HIGH (ref 6–20)
CO2: 31 mmol/L (ref 22–32)
Calcium: 8.3 mg/dL — ABNORMAL LOW (ref 8.9–10.3)
Chloride: 91 mmol/L — ABNORMAL LOW (ref 98–111)
Creatinine, Ser: 1.43 mg/dL — ABNORMAL HIGH (ref 0.61–1.24)
GFR, Estimated: 58 mL/min — ABNORMAL LOW (ref >60)
Glucose, Bld: 438 mg/dL — ABNORMAL HIGH (ref 70–99)
Potassium: 4 mmol/L (ref 3.5–5.1)
Sodium: 130 mmol/L — ABNORMAL LOW (ref 135–145)
Total Bilirubin: 1.5 mg/dL — ABNORMAL HIGH (ref 0.3–1.2)
Total Protein: 6.8 g/dL (ref 6.5–8.1)

## 2020-11-20 LAB — LIPASE, BLOOD: Lipase: 19 U/L (ref 11–51)

## 2020-11-20 LAB — TROPONIN I (HIGH SENSITIVITY)
Troponin I (High Sensitivity): 660 ng/L (ref <18)
Troponin I (High Sensitivity): 646 ng/L (ref <18)

## 2020-11-20 LAB — GLUCOSE, CAPILLARY: Glucose-Capillary: 128 mg/dL — ABNORMAL HIGH (ref 70–99)

## 2020-11-20 LAB — CBG MONITORING, ED: Glucose-Capillary: 425 mg/dL — ABNORMAL HIGH (ref 70–99)

## 2020-11-20 LAB — BRAIN NATRIURETIC PEPTIDE: B Natriuretic Peptide: >4500 pg/mL — ABNORMAL HIGH (ref 0.0–100.0)

## 2020-11-20 MED ORDER — FUROSEMIDE 10 MG/ML IJ SOLN
80.0000 mg | Freq: Two times a day (BID) | INTRAMUSCULAR | Status: DC
  Administered 2020-11-21 – 2020-11-22 (×3): 80 mg via INTRAVENOUS
  Filled 2020-11-20 (×3): qty 8

## 2020-11-20 MED ORDER — FUROSEMIDE 10 MG/ML IJ SOLN
80.0000 mg | Freq: Once | INTRAMUSCULAR | Status: AC
  Administered 2020-11-20: 80 mg via INTRAVENOUS
  Filled 2020-11-20: qty 8

## 2020-11-20 MED ORDER — APIXABAN 5 MG PO TABS
5.0000 mg | ORAL_TABLET | Freq: Two times a day (BID) | ORAL | Status: DC
Start: 2020-11-20 — End: 2020-11-24
  Administered 2020-11-20 – 2020-11-24 (×8): 5 mg via ORAL
  Filled 2020-11-20 (×8): qty 1

## 2020-11-20 MED ORDER — PANTOPRAZOLE SODIUM 40 MG PO TBEC
40.0000 mg | DELAYED_RELEASE_TABLET | Freq: Every day | ORAL | Status: DC
  Administered 2020-11-21 – 2020-11-24 (×4): 40 mg via ORAL
  Filled 2020-11-20 (×4): qty 1

## 2020-11-20 MED ORDER — MOMETASONE FURO-FORMOTEROL FUM 200-5 MCG/ACT IN AERO
2.0000 | INHALATION_SPRAY | Freq: Two times a day (BID) | RESPIRATORY_TRACT | Status: DC
  Administered 2020-11-21 – 2020-11-24 (×6): 2 via RESPIRATORY_TRACT
  Filled 2020-11-20: qty 8.8

## 2020-11-20 MED ORDER — ATORVASTATIN CALCIUM 80 MG PO TABS
80.0000 mg | ORAL_TABLET | Freq: Every day | ORAL | Status: DC
Start: 2020-11-21 — End: 2020-11-24
  Administered 2020-11-21 – 2020-11-24 (×4): 80 mg via ORAL
  Filled 2020-11-20 (×4): qty 1

## 2020-11-20 MED ORDER — TRAZODONE HCL 50 MG PO TABS
150.0000 mg | ORAL_TABLET | Freq: Every day | ORAL | Status: DC
  Administered 2020-11-20 – 2020-11-23 (×4): 150 mg via ORAL
  Filled 2020-11-20 (×4): qty 3

## 2020-11-20 MED ORDER — INSULIN GLARGINE 100 UNIT/ML ~~LOC~~ SOLN
12.0000 [IU] | Freq: Every evening | SUBCUTANEOUS | Status: DC
  Administered 2020-11-20 – 2020-11-21 (×2): 12 [IU] via SUBCUTANEOUS
  Filled 2020-11-20 (×3): qty 0.12

## 2020-11-20 MED ORDER — ONDANSETRON HCL 4 MG PO TABS: 4.0000 mg | ORAL_TABLET | Freq: Four times a day (QID) | ORAL | Status: DC | PRN

## 2020-11-20 MED ORDER — ASPIRIN EC 81 MG PO TBEC
81.0000 mg | DELAYED_RELEASE_TABLET | Freq: Every day | ORAL | Status: DC
  Administered 2020-11-21 – 2020-11-24 (×4): 81 mg via ORAL
  Filled 2020-11-20 (×4): qty 1

## 2020-11-20 MED ORDER — SACUBITRIL-VALSARTAN 24-26 MG PO TABS
1.0000 | ORAL_TABLET | Freq: Two times a day (BID) | ORAL | Status: DC
  Administered 2020-11-20 – 2020-11-24 (×8): 1 via ORAL
  Filled 2020-11-20 (×8): qty 1

## 2020-11-20 MED ORDER — INSULIN ASPART 100 UNIT/ML ~~LOC~~ SOLN
0.0000 [IU] | Freq: Three times a day (TID) | SUBCUTANEOUS | Status: DC
  Administered 2020-11-21: 3 [IU] via SUBCUTANEOUS
  Administered 2020-11-21: 4 [IU] via SUBCUTANEOUS
  Administered 2020-11-22: 11 [IU] via SUBCUTANEOUS
  Administered 2020-11-22: 4 [IU] via SUBCUTANEOUS
  Administered 2020-11-22: 7 [IU] via SUBCUTANEOUS
  Administered 2020-11-23: 20 [IU] via SUBCUTANEOUS
  Administered 2020-11-23 (×2): 4 [IU] via SUBCUTANEOUS
  Administered 2020-11-24: 3 [IU] via SUBCUTANEOUS
  Administered 2020-11-24: 20 [IU] via SUBCUTANEOUS

## 2020-11-20 MED ORDER — INSULIN ASPART 100 UNIT/ML ~~LOC~~ SOLN
10.0000 [IU] | Freq: Once | SUBCUTANEOUS | Status: AC
  Administered 2020-11-20: 10 [IU] via SUBCUTANEOUS
  Filled 2020-11-20: qty 1

## 2020-11-20 MED ORDER — POTASSIUM CHLORIDE CRYS ER 20 MEQ PO TBCR
40.0000 meq | EXTENDED_RELEASE_TABLET | Freq: Two times a day (BID) | ORAL | Status: DC
  Administered 2020-11-20 – 2020-11-24 (×8): 40 meq via ORAL
  Filled 2020-11-20 (×8): qty 2

## 2020-11-20 MED ORDER — INSULIN ASPART 100 UNIT/ML ~~LOC~~ SOLN
0.0000 [IU] | Freq: Every day | SUBCUTANEOUS | Status: DC
  Administered 2020-11-22: 2 [IU] via SUBCUTANEOUS
  Administered 2020-11-23: 3 [IU] via SUBCUTANEOUS

## 2020-11-20 MED ORDER — ONDANSETRON HCL 4 MG/2ML IJ SOLN: 4.0000 mg | Freq: Four times a day (QID) | INTRAMUSCULAR | Status: DC | PRN

## 2020-11-20 MED ORDER — GABAPENTIN 300 MG PO CAPS
300.0000 mg | ORAL_CAPSULE | Freq: Every day | ORAL | Status: DC
  Administered 2020-11-20 – 2020-11-23 (×4): 300 mg via ORAL
  Filled 2020-11-20 (×4): qty 1

## 2020-11-20 NOTE — ED Notes (Signed)
CRITICAL VALUE ALERT  Critical Value:  Troponin 660  Date & Time Notied:  11/20/20 & 1555hrs  Provider Notified: EDP  Orders Received/Actions taken: notified

## 2020-11-20 NOTE — ED Provider Notes (Signed)
[facility] Provider Note   CSN: [id number] Arrival date & time: 11/20/20  1225     History Chief Complaint  Patient presents with  . Emesis    Dustin Cruz is a 55 y.o. male with PMH of HTN, HLD, CAD and MI on Eliquis, COPD, CHF, CKD, and insulin-dependent type 2 DM who presents the ED via EMS from [facility] for weakness.  I reviewed patient's medical record and he was recently admitted on 10/22/2020 and discharged 11/18/2020 for anasarca and CHF exacerbation.  Patient's weight was down to 208 pounds from 258 pounds at time of admission.  Most recent heart catheterization was in 2013.  Encouraged to continue Lipitor and Toprol.  Cardiology was consulted for wide-complex tachycardia, but they did not feel as though he was a candidate for ICD given his medication noncompliance history.  Patient's A1c was poorly controlled at 14, recommended continued insulin and CBG regularly.  Patient also has multiple wounds for which she was treated with Keflex and doxycycline for possible superimposed cellulitis.  Patient was COVID-19 positive incidentally on 11/04/2020.  On my examination, patient reports that he does not believe he has been receiving his medications at the SNF.  He feels as though he is gaining weight and has had increased swelling in his abdomen and face.  He feels generalized weakness.  He suspected that his symptoms were due to food poisoning from "weird food" that he had last evening.  He states that he woke up at approximately 1 AM this morning with nausea and nonbloody emesis.  He denies any changes in his bowel habits.  HPI     Past Medical History:  Diagnosis Date  . Anxiety   . Asthma   . CHF (congestive heart failure) ([place])   . CKD stage 3 due to type 2 diabetes mellitus ([place])   . COPD (chronic obstructive pulmonary disease) ([place])   . Coronary artery disease    s/p BMS to [facility] 10/2010;  Cath 01/22/12 prox 30-40% LAD, LCx ramus w/ hazy 70-80%  in-stent restenosis, EF 60% treated medically  . Coronary artery disease    s/p BMS to [facility] 10/2010;  Cath 01/22/12 prox 30-40% LAD, LCx ramus w/ hazy 70-80% in-stent restenosis, EF 60% treated medically   . Depression   . Diabetes mellitus    Type 2  . Gastroparesis 05/2013  . GERD (gastroesophageal reflux disease)   . HTN (hypertension)   . Hyperlipidemia   . Hyperlipidemia   . Major depression, chronic 08/13/2012  . Melanoma ([place]) 2007   surgery at [facility], Followed by [facility]  . Myocardial infarction ([place]) 2012  . Obesity   . Tobacco abuse   . Tubular adenoma   . Urinary retention     Patient Active Problem List   Diagnosis Date Noted  . Elevated troponin 11/20/2020  . COVID 11/05/2020  . Acute on chronic diastolic (congestive) heart failure ([place]) 10/22/2020  . Acute on chronic combined systolic and diastolic CHF (congestive heart failure) ([place]) 09/28/2020  . Peripheral edema 09/27/2020  . Acute on chronic systolic (congestive) heart failure ([place]) 09/27/2020  . Scrotum swelling 09/26/2020  . Acute diastolic CHF (congestive heart failure) ([place]) 07/23/2020  . Abnormal weight gain 07/23/2020  . Noncompliance 07/23/2020  . Pelvic lymphadenopathy 07/20/2020  . Acute on chronic diastolic CHF (congestive heart failure) ([place]) 06/30/2020  . CHF (congestive heart failure) ([place]) 06/29/2020  . Pre-ulcerative calluses 10/29/2019  . Musculoskeletal pain 10/13/2019  . Housing problems 05/23/2019  .  Essential hypertension, benign 03/20/2019  . 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 Near syncope 03/17/2019  . AKI (acute kidney injury) ([place]) 03/17/2019  . 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 CKD stage 3 due to type 2 diabetes mellitus ([place]) 11/28/2018  . Microalbuminuria due to type 2 diabetes mellitus ([place]) 05/22/2018  . Hyperglycemia due to diabetes mellitus ([place]) 04/24/2018  . Abdominal pain, epigastric 11/29/2017  . Diarrhea 11/29/2017  . History of colonic polyps 11/29/2017  . Diffuse  abdominal pain 11/29/2017  . Diabetic retinopathy ([place]) 09/21/2015  . Carotid bruit 09/21/2015  . Viral gastroenteritis 06/03/2015  . Hypertriglyceridemia 05/18/2015  . Vitamin D deficiency 12/29/2013  . Nausea alone 04/24/2013  . Frequency of urination 04/14/2013  . Fever, unspecified 04/14/2013  . Hepatic steatosis 12/28/2012  . Peripheral neuropathy 11/25/2012  . Melanoma of skin, site unspecified 11/25/2012  . Mixed hyperlipidemia   . Depression   . Hypertension associated with diabetes ([place])   . GERD (gastroesophageal reflux disease) 04/03/2012  . Current smoker 03/14/2012  . Type 2 diabetes mellitus with stage 3a chronic kidney disease, with long-term current use of insulin ([place])   . CAD (coronary artery disease) 01/06/2011    Past Surgical History:  Procedure Laterality Date  . BIOPSY  01/07/2018   Procedure: BIOPSY;  Surgeon: Daneil Dolin, MD;  [facility];  Service: Endoscopy;;  duodenal and gastric biopsy  . BIOPSY  04/25/2018   Procedure: BIOPSY;  Surgeon: Daneil Dolin, MD;  [facility];  Service: Endoscopy;;  ascending and descending and sigmoid biopsies  . CARDIAC CATHETERIZATION     with stent  . COLONOSCOPY N/A 01/27/2013   KAJ:GOTLXB and colonic polyps. Tubular adenomas, poor bowel prep, one-year follow-up surveillance colonoscopy recommended  . COLONOSCOPY WITH PROPOFOL N/A 04/25/2018   Procedure: COLONOSCOPY WITH PROPOFOL;  Surgeon: Daneil Dolin, MD;  Location[facility];  Service: Endoscopy;  Laterality: N/A;  11:00am - pt to be prepped INPT and labs to be done as well  . CORONARY ANGIOPLASTY WITH STENT PLACEMENT    . ESOPHAGOGASTRODUODENOSCOPY  04/24/2012   [place]-mild erosive reflux esophagitis,dilated w/79F Venia Minks, small HH, minimal chronic gastric/bulbar erosions(No H pylori)  . ESOPHAGOGASTRODUODENOSCOPY (EGD) WITH PROPOFOL N/A 01/07/2018   Dr. Gala Romney: Erythematous mucosa in the stomach, retained gastric contents, incomplete  exam.  Biopsied showed mild chronic gastritis but no H. pylori.  Duodenal biopsy was negative for celiac disease.  [name] FLEXIBLE SIGMOIDOSCOPY N/A 01/07/2018   Dr. Gala Romney: Incomplete colonoscopy due to inadequate bowel prep  . LEFT HEART CATHETERIZATION WITH CORONARY ANGIOGRAM N/A 01/22/2012   Procedure: LEFT HEART CATHETERIZATION WITH CORONARY ANGIOGRAM;  Surgeon: Jolaine Artist, MD;  Location: [facility] CATH LAB;  Service: Cardiovascular;  Laterality: N/A;  . melanoma surgery  2007   [place], removed lymph nodes under arm as well,Left abd       Family History  Problem Relation Age of Onset  . Stroke Mother   . Alcohol abuse Father   . Heart disease Other   . Other Other        not real familiar with family history  . Lung cancer Other   . Colon cancer Neg Hx   . Colon polyps Neg Hx     Social History   Tobacco Use  . Smoking status: Current Every Day Smoker    Packs/day: 0.50    Years: 31.00    [place] years: 15.50    Types: Cigarettes    Start date: 10/09/1981  . Smokeless  tobacco: Never Used  . Tobacco comment: 1/2 [place] daily 11/01/17  Vaping Use  . Vaping Use: Never used  Substance Use Topics  . 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  . Drug use: No    Home Medications Prior to Admission medications   Medication Sig Start Date End Date Taking? Authorizing Provider  acetaminophen (TYLENOL) 325 MG tablet Take 2 tablets (650 mg total) by mouth every 6 (six) hours as needed for mild pain, moderate pain or fever (or Fever >/= 101). Patient not taking: No sig reported 03/21/19   Roxan Hockey, MD  albuterol (PROVENTIL) (2.5 MG/3ML) 0.083% nebulizer solution Take 3 mLs (2.5 mg total) by nebulization every 6 (six) hours as needed for wheezing or shortness of breath. Dx---44.1 Patient not taking: No sig reported 02/16/20   Ronnie Doss M, DO  albuterol  (VENTOLIN HFA) 108 (90 Base) MCG/ACT inhaler Inhale 2 puffs into the lungs every 6 (six) hours as needed for wheezing or shortness of breath. INHALE 2 PUFFS BY MOUTH EVERY 6 HOURS AS NEEDED 11/18/20   Roxan Hockey, MD  aspirin EC 81 MG tablet Take 1 tablet (81 mg total) by mouth daily with breakfast. Patient not taking: Reported on 10/22/2020 03/21/19   Roxan Hockey, MD  atorvastatin (LIPITOR) 80 MG tablet Take 1 tablet (80 mg total) by mouth daily. 07/01/20   [name], DO  busPIRone (BUSPAR) 7.5 MG tablet Take 1 tablet by mouth twice daily 09/06/20   Ronnie Doss M, DO  cephALEXin (KEFLEX) 500 MG capsule Take 1 capsule (500 mg total) by mouth 2 (two) times daily for 7 days. 11/18/20 11/25/20  Roxan Hockey, MD  collagenase (SANTYL) ointment Apply 1 application topically daily. 11/18/20   Roxan Hockey, MD  doxycycline (VIBRA-TABS) 100 MG tablet Take 1 tablet (100 mg total) by mouth 2 (two) times daily for 7 days. 11/18/20 11/25/20  Roxan Hockey, MD  ELIQUIS 5 MG TABS tablet Take 1 tablet by mouth twice daily 09/22/20   Strader, [place] M, PA-C  ENTRESTO 24-26 MG Take 1 tablet by mouth 2 (two) times daily. 11/18/20   Roxan Hockey, MD  fluconazole (DIFLUCAN) 200 MG tablet Take 1 tablet (200 mg total) by mouth daily for 2 days. 11/19/20 11/21/20  Roxan Hockey, MD  gabapentin (NEURONTIN) 300 MG capsule Take 1 capsule (300 mg total) by mouth at bedtime. 11/18/20   Roxan Hockey, MD  insulin aspart (NOVOLOG) 100 UNIT/ML FlexPen Inject 0-10 Units into the skin See admin instructions. insulin aspart (novoLOG) injection 0-10 Units 0-10 Units Subcutaneous, 3 times daily with meals CBG < 70: Implement Hypoglycemia Standing Orders and refer to Hypoglycemia Standing Orders sidebar report  CBG 70 - 120: 0 unit CBG 121 - 150: 0 unit  CBG 151 - 200: 1 unit CBG 201 - 250: 2 units CBG 251 - 300: 4 units CBG 301 - 350: 6 units  CBG 351 - 400: 8 units  CBG > 400: 10 units 11/18/20   Emokpae,  Courage, MD  insulin glargine (LANTUS) 100 UNIT/ML injection Inject 0.12 mLs (12 Units total) into the skin every evening. 11/18/20   Roxan Hockey, MD  lisinopril (ZESTRIL) 10 MG tablet Take 1 tablet (10 mg total) by mouth at bedtime. 07/01/20   [name], DO  metoprolol succinate (TOPROL-XL) 25 MG 24 hr tablet Take 12.5 mg by mouth  daily. 07/30/20   [provider]  mometasone-formoterol (DULERA) 200-5 MCG/ACT AERO Inhale 2 puffs into the lungs in the morning and at bedtime. 11/18/20   Roxan Hockey, MD  nystatin (MYCOSTATIN/NYSTOP) powder Apply topically 2 (two) times daily. 11/18/20   Roxan Hockey, MD  oxyCODONE (OXY IR/ROXICODONE) 5 MG immediate release tablet Take 1 tablet (5 mg total) by mouth 2 (two) times daily as needed for up to 5 days for moderate pain. 11/18/20 11/23/20  Roxan Hockey, MD  pantoprazole (PROTONIX) 40 MG tablet Take 1 tablet (40 mg total) by mouth daily. 11/18/20 11/18/21  Roxan Hockey, MD  potassium chloride SA (KLOR-CON) 20 MEQ tablet Take 1 tablet (20 mEq total) by mouth 2 (two) times daily. 11/18/20   Roxan Hockey, MD  torsemide 60 MG TABS Take 60 mg by mouth 2 (two) times daily. 11/18/20   Roxan Hockey, MD  traZODone (DESYREL) 150 MG tablet TAKE 1 TABLET BY MOUTH ONCE DAILY AT BEDTIME Patient taking differently: Take 150 mg by mouth at bedtime. 08/31/20   [name], DO  metoprolol tartrate (LOPRESSOR) 25 MG tablet Take 25 mg by mouth 2 (two) times daily.    12/27/11  [provider]    Allergies    Patient has no known allergies.  Review of Systems   Review of Systems  All other systems reviewed and are negative.   Physical Exam Updated Vital Signs BP 124/87   Pulse 92   Temp 98.6 F (37 C) (Oral)   Resp 15   Ht 6\' 2"  (1.88 m)   Wt 93.3 kg   SpO2 97%   BMI 26.41 kg/m   Physical Exam Vitals and nursing note reviewed. Exam conducted with a chaperone present.  Constitutional:      Comments:  Fatigued.  HENT:     Head: Normocephalic and atraumatic.  Eyes:     General: No scleral icterus.    Conjunctiva/sclera: Conjunctivae normal.  Cardiovascular:     Rate and Rhythm: Normal rate and regular rhythm.     Pulses: Normal pulses.  Pulmonary:     Effort: Pulmonary effort is normal. No respiratory distress.     Breath sounds: No wheezing or rales.     Comments: No significant increased work of breathing.  Saturating well on room air. Abdominal:     General: Abdomen is flat. There is no distension.     Palpations: Abdomen is soft.     Tenderness: There is no abdominal tenderness.  Musculoskeletal:     Right lower leg: Edema present.     Left lower leg: Edema present.     Comments: Significant pitting edema bilaterally.  Skin:    General: Skin is dry.  Neurological:     Mental Status: He is alert and oriented to person, place, and time.     GCS: GCS eye subscore is 4. GCS verbal subscore is 5. GCS motor subscore is 6.  Psychiatric:        Mood and Affect: Mood normal.        Behavior: Behavior normal.        Thought Content: Thought content normal.     ED Results / Procedures / Treatments   Labs (all labs ordered are listed, but only abnormal results are displayed) Labs Reviewed  CBC WITH DIFFERENTIAL/PLATELET - Abnormal; Notable for the following components:      Result Value   RBC 3.23 (*)    Hemoglobin 9.3 (*)    HCT 29.5 (*)  RDW 16.1 (*)    Platelets 455 (*)    All other components within normal limits  COMPREHENSIVE METABOLIC PANEL - Abnormal; Notable for the following components:   Sodium 130 (*)    Chloride 91 (*)    Glucose, Bld 438 (*)    BUN 34 (*)    Creatinine, Ser 1.43 (*)    Calcium 8.3 (*)    Albumin 2.2 (*)    AST 44 (*)    ALT 77 (*)    Alkaline Phosphatase 175 (*)    Total Bilirubin 1.5 (*)    GFR, Estimated 58 (*)    All other components within normal limits  BRAIN NATRIURETIC PEPTIDE - Abnormal; Notable for the following  components:   B Natriuretic Peptide >4,500.0 (*)    All other components within normal limits  CBG MONITORING, ED - Abnormal; Notable for the following components:   Glucose-Capillary 425 (*)    All other components within normal limits  TROPONIN I (HIGH SENSITIVITY) - Abnormal; Notable for the following components:   Troponin I (High Sensitivity) 660 (*)    All other components within normal limits  LIPASE, BLOOD  TROPONIN I (HIGH SENSITIVITY)    EKG EKG Interpretation  Date/Time:  Saturday November 20 2020 15:59:45 EST Ventricular Rate:  93 PR Interval:    QRS Duration: 150 QT Interval:  435 QTC Calculation: 542 R Axis:   87 Text Interpretation: indeterminate rhythym Multiple ventricular premature complexes Prolonged PR interval Nonspecific intraventricular conduction delay Anterolateral infarct, age indeterminate since last tracing no significant change 10/27/20 Reconfirmed by Daleen Bo [phone]) on 11/20/2020 4:05:56 PM   Radiology DG Chest Portable 1 View  Result Date: 11/20/2020 CLINICAL DATA:  Dyspnea and weakness EXAM: PORTABLE CHEST 1 VIEW COMPARISON:  10/26/2020 chest radiograph. FINDINGS: Right rotated chest radiograph. Stable cardiomediastinal silhouette with normal heart size. No pneumothorax. No pleural effusion. No overt pulmonary edema. No acute consolidative airspace disease. IMPRESSION: No active disease. Electronically Signed   By: Ilona Sorrel M.D.   On: 11/20/2020 14:00    Procedures .Critical Care Performed by: Corena Herter, PA-C Authorized by: Corena Herter, PA-C   Critical care provider statement:    Critical care time (minutes):  60   Critical care was necessary to treat or prevent imminent or life-threatening deterioration of the following conditions:  Cardiac failure   Critical care was time spent personally by me on the following activities:  Discussions with consultants, evaluation of patient's response to treatment, examination of patient,  ordering and performing treatments and interventions, ordering and review of laboratory studies, ordering and review of radiographic studies, pulse oximetry, re-evaluation of patient's condition, obtaining history from patient or surrogate, review of old charts and blood draw for specimens Comments:     Elevated troponin over 600     Medications Ordered in ED Medications  furosemide (LASIX) injection 80 mg (80 mg Intravenous Given 11/20/20 1623)    ED Course  I have reviewed the triage vital signs and the nursing notes.  Pertinent labs & imaging results that were available during my care of the patient were reviewed by me and considered in my medical decision making (see chart for details).  Clinical Course as of 11/20/20 1748  Sat Nov 20, 2020  1721 I spoke with Dr. Gasper Sells who recommends hospitalist admission.  He states that patient could either be admitted to [facility] for IV diuresis and repeat echocardiogram.  If wall motion abnormalities, he will need to be sent  to [facility].  Or he can be admitted directly to [facility] hospitalist services and he will round on patient. [GG]  0354 I spoke with Dr. Nehemiah Settle who will admit patient. [GG]    Clinical Course User Index [GG] Reita Chard   MDM Rules/Calculators/A&P                          Dustin Cruz was evaluated in Emergency Department on 11/20/2020 for the symptoms described in the history of present illness. He was evaluated in the context of the global COVID-19 pandemic, which necessitated consideration that the patient might be at risk for infection with the SARS-CoV-2 virus that causes COVID-19. Institutional protocols and algorithms that pertain to the evaluation of patients at risk for COVID-19 are in a state of rapid change based on information released by regulatory bodies including the CDC and federal and state organizations. These policies and algorithms were followed during the patient's care in the  ED.  I personally reviewed patient's medical chart and all notes from triage and staff during today's encounter. I have also ordered and reviewed all labs and imaging that I felt to be medically necessary in the evaluation of this patient's complaints and with consideration of their physical exam. If needed, translation services were available and utilized.   EKG relatively unchanged when compared to priors.  Troponin elevated to 660, significant worsening when compared to baseline.  Will trend.  BNP greater than 4500, also much worse when compared to recent labs obtained.  CMP relatively consistent with baseline.  CBC with mild anemia with hemoglobin of 9.3, also relatively consistent with baseline.  No leukocytosis.  Lipase within normal limits.  Chest x-ray is without any acute cardiopulmonary disease.  His weight on evaluation here in the ED is approximately 205 pounds, no weight gain since discharge.  Patient required 80 mg IV Lasix 3 times daily during last ED admission.  Patient is currently resting relatively comfortably, continues to deny any chest pain or shortness of breath.  Dr. Harl Bowie with [facility] had consulted patient during prior admission.  We will reach out to [facility] regarding today's findings.  During admission, his troponins have been flat between 34-36.  Last catheterization was in 2013.  He had up to 25 beats of nonsustained ventricular tachycardia on 10/27/2020, but was not a candidate for ICD given his medication non compliance.  Most recent echocardiogram obtained during recent admission revealed EF of 35 to 40% with global HK and mildly reduced right ventricular function.  I spoke with Dr. Gasper Sells who recommends hospitalist admission.  He states that patient could either be admitted to [facility] for IV diuresis and repeat echocardiogram.  If wall motion abnormalities, he will need to be sent to [facility].  Or he can be admitted directly to [facility] hospitalist services and he  will round on patient.  I spoke with Dr. Nehemiah Settle who will admit patient.   Final Clinical Impression(s) / ED Diagnoses Final diagnoses:  Elevated troponin  Acute on chronic congestive heart failure, unspecified heart failure type [facility])    Rx / DC Orders ED Discharge Orders    None       Corena Herter, PA-C 11/20/20 1749    Daleen Bo, MD 11/21/20 [phone]

## 2020-11-20 NOTE — ED Provider Notes (Signed)
  Face-to-face evaluation   History: Patient presents for evaluation of nausea and vomiting.  Started this morning while he was at his nursing care facility.  He denies headache, chest pain, shortness of breath, new weakness or dizziness.  He states he is taking his usual medicines as directed.  Physical exam: Alert, calm and cooperative.  Heart irregular, not tachycardic.  Lungs clear without respiratory distress.  Moderate peripheral edema bilaterally.  Medical screening examination/treatment/procedure(s) were conducted as a shared visit with non-physician practitioner(s) and myself.  I personally evaluated the patient during the encounter    Daleen Bo, MD 11/21/20 [phone]

## 2020-11-20 NOTE — ED Triage Notes (Signed)
Pt brought in by [facility] from [place] with c/o nausea and vomiting since this morning. Pt was given Meclizine with no relief. Pt reports feeling weak since 0100 this morning as well.

## 2020-11-20 NOTE — H&P (Signed)
History and Physical  [name] IPJ:[id number] DOB: [birth year] DOA: 11/20/2020  Referring physician: Krista Blue, PA-C, EDP PCP: [name], DO  Outpatient Specialists:   Patient Coming From: [facility] nursing facility  Chief Complaint: Leg swelling, shortness of breath  HPI: Dustin Cruz is a 55 y.o. male with a history of diabetes, stage III chronic kidney disease, diastolic and systolic heart failure, history of MI, hypertension, GERD, history of DVT on chronic anticoagulation.  Patient is not a clear historian.  Patient was admitted on 1/14 and discharged 2/10 for anasarca and CHF exacerbation.  At time of discharge, the patient lost approximately 50 pounds with a weight of 208.  Patient has been at skilled nursing facility.  He feels like he has been gaining weight and uncertain what his medications are or his current regimen.  No palliating or provoking factors to his shortness of breath, leg swelling and weakness.  Emergency Department Course: Creatinine 1.43, BNP greater than 4500, high-sensitivity troponin 662 decreasing to 646 approximately 4 hours later, blood sugar 438.  Hemoglobin 9.3.  Review of Systems:   Pt denies any fevers, chills, nausea, vomiting, diarrhea, constipation, abdominal pain, palpitations, headache, vision changes, lightheadedness, dizziness, melena, rectal bleeding.  Review of systems are otherwise negative  Past Medical History:  Diagnosis Date  . Anxiety   . Asthma   . CHF (congestive heart failure) ([place])   . CKD stage 3 due to type 2 diabetes mellitus ([place])   . COPD (chronic obstructive pulmonary disease) ([place])   . Coronary artery disease    s/p BMS to [place] 10/2010;  Cath 01/22/12 prox 30-40% LAD, LCx ramus w/ hazy 70-80% in-stent restenosis, EF 60% treated medically  . Coronary artery disease    s/p BMS to [facility] 10/2010;  Cath 01/22/12 prox 30-40% LAD, LCx ramus w/ hazy 70-80% in-stent restenosis, EF 60% treated medically   . Depression   .  Diabetes mellitus    Type 2  . Gastroparesis 05/2013  . GERD (gastroesophageal reflux disease)   . HTN (hypertension)   . Hyperlipidemia   . Hyperlipidemia   . Major depression, chronic 08/13/2012  . Melanoma ([place]) 2007   surgery at [facility], Followed by [facility]  . Myocardial infarction ([place]) 2012  . Obesity   . Tobacco abuse   . Tubular adenoma   . Urinary retention    Past Surgical History:  Procedure Laterality Date  . BIOPSY  01/07/2018   Procedure: BIOPSY;  Surgeon: Daneil Dolin, MD;  [facility]: [facility];  Service: Endoscopy;;  duodenal and gastric biopsy  . BIOPSY  04/25/2018   Procedure: BIOPSY;  Surgeon: Daneil Dolin, MD;  Location: [facility];  Service: Endoscopy;;  ascending and descending and sigmoid biopsies  . CARDIAC CATHETERIZATION     with stent  . COLONOSCOPY N/A 01/27/2013   [facility]:LPFXTK and colonic polyps. Tubular adenomas, poor bowel prep, one-year follow-up surveillance colonoscopy recommended  . COLONOSCOPY WITH PROPOFOL N/A 04/25/2018   Procedure: COLONOSCOPY WITH PROPOFOL;  Surgeon: Daneil Dolin, MD;  Location: [facility];  Service: Endoscopy;  Laterality: N/A;  11:00am - pt to be prepped INPT and labs to be done as well  . CORONARY ANGIOPLASTY WITH STENT PLACEMENT    . ESOPHAGOGASTRODUODENOSCOPY  04/24/2012   [place]-mild erosive reflux esophagitis,dilated w/49F Venia Minks, small HH, minimal chronic gastric/bulbar erosions(No H pylori)  . ESOPHAGOGASTRODUODENOSCOPY (EGD) WITH PROPOFOL N/A 01/07/2018   Dr. Gala Romney: Erythematous mucosa in the stomach, retained gastric contents, incomplete exam.  Biopsied showed mild chronic gastritis but no H. pylori.  Duodenal biopsy was negative for celiac disease.  [name] FLEXIBLE SIGMOIDOSCOPY N/A 01/07/2018   Dr. Gala Romney: Incomplete colonoscopy due to inadequate bowel prep  . LEFT HEART CATHETERIZATION WITH CORONARY ANGIOGRAM N/A 01/22/2012   Procedure: LEFT HEART CATHETERIZATION WITH CORONARY ANGIOGRAM;  Surgeon: Jolaine Artist, MD;  Location: [facility] CATH LAB;  Service: Cardiovascular;  Laterality: N/A;  . melanoma surgery  2007   [place], removed lymph nodes under arm as well,Left abd   Social History:  reports that he has been smoking cigarettes. He started smoking about 39 years ago. He has a 15.50 Sorg-year smoking history. He has never used smokeless tobacco. He reports that he does not drink alcohol and does not use drugs. Patient currently at [facility] facility  No Known Allergies  Family History  Problem Relation Age of Onset  . Stroke Mother   . Alcohol abuse Father   . Heart disease Other   . Other Other        not real familiar with family history  . Lung cancer Other   . Colon cancer Neg Hx   . Colon polyps Neg Hx       Prior to Admission medications   Medication Sig Start Date End Date Taking? Authorizing Provider  acetaminophen (TYLENOL) 325 MG tablet Take 2 tablets (650 mg total) by mouth every 6 (six) hours as needed for mild pain, moderate pain or fever (or Fever >/= 101). Patient not taking: No sig reported 03/21/19   Roxan Hockey, MD  albuterol (PROVENTIL) (2.5 MG/3ML) 0.083% nebulizer solution Take 3 mLs (2.5 mg total) by nebulization every 6 (six) hours as needed for wheezing or shortness of breath. Dx---44.1 Patient not taking: No sig reported 02/16/20   Ronnie Doss M, DO  albuterol (VENTOLIN HFA) 108 (90 Base) MCG/ACT inhaler Inhale 2 puffs into the lungs every 6 (six) hours as needed for wheezing or shortness of breath. INHALE 2 PUFFS BY MOUTH EVERY 6 HOURS AS NEEDED 11/18/20   Roxan Hockey, MD  aspirin EC 81 MG tablet Take 1 tablet (81 mg total) by mouth daily with breakfast. Patient not taking: Reported on 10/22/2020 03/21/19   Roxan Hockey, MD  atorvastatin (LIPITOR) 80 MG tablet Take 1 tablet (80 mg total) by mouth daily. 07/01/20   [name], DO  busPIRone (BUSPAR) 7.5 MG tablet Take 1 tablet by mouth twice daily 09/06/20   Ronnie Doss M, DO   cephALEXin (KEFLEX) 500 MG capsule Take 1 capsule (500 mg total) by mouth 2 (two) times daily for 7 days. 11/18/20 11/25/20  Roxan Hockey, MD  collagenase (SANTYL) ointment Apply 1 application topically daily. 11/18/20   Roxan Hockey, MD  doxycycline (VIBRA-TABS) 100 MG tablet Take 1 tablet (100 mg total) by mouth 2 (two) times daily for 7 days. 11/18/20 11/25/20  Roxan Hockey, MD  ELIQUIS 5 MG TABS tablet Take 1 tablet by mouth twice daily 09/22/20   Strader, [place] M, PA-C  ENTRESTO 24-26 MG Take 1 tablet by mouth 2 (two) times daily. 11/18/20   Roxan Hockey, MD  fluconazole (DIFLUCAN) 200 MG tablet Take 1 tablet (200 mg total) by mouth daily for 2 days. 11/19/20 11/21/20  Roxan Hockey, MD  gabapentin (NEURONTIN) 300 MG capsule Take 1 capsule (300 mg total) by mouth at bedtime. 11/18/20   Roxan Hockey, MD  insulin aspart (NOVOLOG) 100 UNIT/ML FlexPen Inject 0-10 Units into the skin See admin instructions. insulin aspart (novoLOG) injection  0-10 Units 0-10 Units Subcutaneous, 3 times daily with meals CBG < 70: Implement Hypoglycemia Standing Orders and refer to Hypoglycemia Standing Orders sidebar report  CBG 70 - 120: 0 unit CBG 121 - 150: 0 unit  CBG 151 - 200: 1 unit CBG 201 - 250: 2 units CBG 251 - 300: 4 units CBG 301 - 350: 6 units  CBG 351 - 400: 8 units  CBG > 400: 10 units 11/18/20   Emokpae, Courage, MD  insulin glargine (LANTUS) 100 UNIT/ML injection Inject 0.12 mLs (12 Units total) into the skin every evening. 11/18/20   Roxan Hockey, MD  lisinopril (ZESTRIL) 10 MG tablet Take 1 tablet (10 mg total) by mouth at bedtime. 07/01/20   [name], DO  metoprolol succinate (TOPROL-XL) 25 MG 24 hr tablet Take 12.5 mg by mouth daily. 07/30/20   [provider]  mometasone-formoterol (DULERA) 200-5 MCG/ACT AERO Inhale 2 puffs into the lungs in the morning and at bedtime. 11/18/20   Roxan Hockey, MD  nystatin (MYCOSTATIN/NYSTOP) powder Apply topically 2 (two)  times daily. 11/18/20   Roxan Hockey, MD  oxyCODONE (OXY IR/ROXICODONE) 5 MG immediate release tablet Take 1 tablet (5 mg total) by mouth 2 (two) times daily as needed for up to 5 days for moderate pain. 11/18/20 11/23/20  Roxan Hockey, MD  pantoprazole (PROTONIX) 40 MG tablet Take 1 tablet (40 mg total) by mouth daily. 11/18/20 11/18/21  Roxan Hockey, MD  potassium chloride SA (KLOR-CON) 20 MEQ tablet Take 1 tablet (20 mEq total) by mouth 2 (two) times daily. 11/18/20   Roxan Hockey, MD  torsemide 60 MG TABS Take 60 mg by mouth 2 (two) times daily. 11/18/20   Roxan Hockey, MD  traZODone (DESYREL) 150 MG tablet TAKE 1 TABLET BY MOUTH ONCE DAILY AT BEDTIME Patient taking differently: Take 150 mg by mouth at bedtime. 08/31/20   [name], DO  metoprolol tartrate (LOPRESSOR) 25 MG tablet Take 25 mg by mouth 2 (two) times daily.    12/27/11  [provider]    Physical Exam: BP 122/80   Pulse 88   Temp 98.6 F (37 C) (Oral)   Resp (!) 23   Ht 6\' 2"  (1.88 m)   Wt 93.3 kg   SpO2 99%   BMI 26.41 kg/m   . General: Middle-age male who appears older than stated age. Awake and alert and oriented x3. No acute cardiopulmonary distress.  [name] HEENT: Normocephalic atraumatic.  Right and left ears normal in appearance.  Pupils equal, round, reactive to light. Extraocular muscles are intact. Sclerae anicteric and noninjected.  Moist mucosal membranes. No mucosal lesions.  . Neck: Neck supple without lymphadenopathy. No carotid bruits. No masses palpated.  . Cardiovascular: Regular rate with normal S1-S2 sounds. No murmurs, rubs, gallops auscultated.  Increased JVD.  3+ pitting edema in lower extremities bilaterally. [name] Respiratory: Good respiratory effort.  Slight Rales. Lungs clear to auscultation bilaterally.  No accessory muscle use. . Abdomen: Soft, nontender, nondistended. Active bowel sounds. No masses or hepatosplenomegaly  . Skin: No rashes, lesions, or ulcerations.   Dry, warm to touch. 2+ dorsalis pedis and radial pulses. . Musculoskeletal: No calf or leg pain. All major joints not erythematous nontender.  No upper or lower joint deformation.  Good ROM.  No contractures  . Psychiatric: Intact judgment and insight. Pleasant and cooperative. . Neurologic: No focal neurological deficits. Strength is 5/5 and symmetric in upper and lower extremities.  Cranial nerves II through XII are  grossly intact.           Labs on Admission: I have personally reviewed following labs and imaging studies  CBC: Recent Labs  Lab 11/18/20 0758 11/20/20 1253  WBC 10.8* 8.9  NEUTROABS  --  6.8  HGB 9.8* 9.3*  HCT 30.9* 29.5*  MCV 92.2 91.3  PLT 529* 767*   Basic Metabolic Panel: Recent Labs  Lab 11/14/20 0647 11/15/20 0400 11/18/20 0758 11/20/20 1253  NA 133* 133* 129* 130*  K 4.7 4.4 4.3 4.0  CL 91* 91* 92* 91*  CO2 31 30 30 31   GLUCOSE 181* 125* 185* 438*  BUN 38* 43* 34* 34*  CREATININE 1.53* 1.64* 1.39* 1.43*  CALCIUM 8.6* 8.7* 8.3* 8.3*   GFR: Estimated Creatinine Clearance: 67.9 mL/min (A) (by C-G formula based on SCr of 1.43 mg/dL (H)). Liver Function Tests: Recent Labs  Lab 11/18/20 0758 11/20/20 1253  AST 59* 44*  ALT 101* 77*  ALKPHOS 198* 175*  BILITOT 1.0 1.5*  PROT 6.7 6.8  ALBUMIN 2.2* 2.2*   Recent Labs  Lab 11/20/20 1253  LIPASE 19   No results for input(s): AMMONIA in the last 168 hours. Coagulation Profile: No results for input(s): INR, PROTIME in the last 168 hours. Cardiac Enzymes: No results for input(s): CKTOTAL, CKMB, CKMBINDEX, TROPONINI in the last 168 hours. BNP (last 3 results) No results for input(s): PROBNP in the last 8760 hours. HbA1C: No results for input(s): HGBA1C in the last 72 hours. CBG: Recent Labs  Lab 11/18/20 0328 11/18/20 0822 11/18/20 1134 11/18/20 1633 11/20/20 1229  GLUCAP 173* 175* 172* 129* 425*   Lipid Profile: No results for input(s): CHOL, HDL, LDLCALC, TRIG, CHOLHDL, LDLDIRECT  in the last 72 hours. Thyroid Function Tests: No results for input(s): TSH, T4TOTAL, FREET4, T3FREE, THYROIDAB in the last 72 hours. Anemia Panel: No results for input(s): VITAMINB12, FOLATE, FERRITIN, TIBC, IRON, RETICCTPCT in the last 72 hours. Urine analysis:    Component Value Date/Time   COLORURINE YELLOW 09/15/2020 2055   APPEARANCEUR CLEAR 09/15/2020 2055   APPEARANCEUR Cloudy (A) 06/10/2019 1549   LABSPEC 1.024 09/15/2020 2055   PHURINE 5.0 09/15/2020 2055   GLUCOSEU >=500 (A) 09/15/2020 2055   HGBUR MODERATE (A) 09/15/2020 2055   BILIRUBINUR NEGATIVE 09/15/2020 2055   BILIRUBINUR Negative 06/10/2019 1549   KETONESUR NEGATIVE 09/15/2020 2055   PROTEINUR >=300 (A) 09/15/2020 2055   UROBILINOGEN 0.2 01/26/2014 1618   NITRITE NEGATIVE 09/15/2020 2055   LEUKOCYTESUR NEGATIVE 09/15/2020 2055   Sepsis Labs: @LABRCNTIP (procalcitonin:4,lacticidven:4) ) Recent Results (from the past 240 hour(s))  SARS CORONAVIRUS 2 (TAT 6-24 HRS) Nasopharyngeal Nasopharyngeal Swab     Status: Abnormal   Collection Time: 11/14/20  1:26 PM   Specimen: Nasopharyngeal Swab  Result Value Ref Range Status   SARS Coronavirus 2 POSITIVE (A) NEGATIVE Final    Comment: (NOTE) SARS-CoV-2 target nucleic acids are DETECTED.  The SARS-CoV-2 RNA is generally detectable in upper and lower respiratory specimens during the acute phase of infection. Positive results are indicative of the presence of SARS-CoV-2 RNA. Clinical correlation with patient history and other diagnostic information is  necessary to determine patient infection status. Positive results do not rule out bacterial infection or co-infection with other viruses.  The expected result is Negative.  Fact Sheet for Patients: SugarRoll.be  Fact Sheet for Healthcare Providers: https://www.woods-mathews.com/  This test is not yet approved or cleared by the [place] FDA and  has been authorized for  detection and/or diagnosis of SARS-CoV-2 by  FDA under an Emergency Use Authorization (EUA). This EUA will remain  in effect (meaning this test can be used) for the duration of the COVID-19 declaration under Section 564(b)(1) of the Act, 21 U. S.C. section 360bbb-3(b)(1), unless the authorization is terminated or revoked sooner.   Performed at [facility], [place] [address], [place], [place] [address]      Radiological Exams on Admission: DG Chest Portable 1 View  Result Date: 11/20/2020 CLINICAL DATA:  Dyspnea and weakness EXAM: PORTABLE CHEST 1 VIEW COMPARISON:  10/26/2020 chest radiograph. FINDINGS: Right rotated chest radiograph. Stable cardiomediastinal silhouette with normal heart size. No pneumothorax. No pleural effusion. No overt pulmonary edema. No acute consolidative airspace disease. IMPRESSION: No active disease. Electronically Signed   By: Ilona Sorrel M.D.   On: 11/20/2020 14:00    EKG: Independently reviewed.  Wide complex sinus rhythm with nonspecific intraventricular conduction delay.  Assessment/Plan: Active Problems:   CAD (coronary artery disease)   GERD (gastroesophageal reflux disease)   Hyperglycemia due to diabetes mellitus ([place])   CKD stage 3 due to type 2 diabetes mellitus ([place])   Essential hypertension, benign   Acute on chronic combined systolic and diastolic CHF (congestive heart failure) ([facility])   Elevated troponin    This patient was discussed with the ED physician, including pertinent vitals, physical exam findings, labs, and imaging.  We also discussed care given by the ED provider.  1. Acute on chronic systolic and diastolic heart failure Admit to [facility] hospital.  Cardiology to consult. Telemetry monitoring Strict I/O Daily Weights Diuresis: lasix 80mg  BID Potassium: 40 mEq twice a day by mouth Echo cardiac exam tomorrow Repeat BMP tomorrow 2. Elevated troponin a. Troponin Stable, less likely NSTEMI - will continue to repeat. b. Echo in  AM. c. Continue eliquis. 3. Hypertension with coronary artery disease a. Continue antihypertensives 4. CKD a. Stable 5. Hyperglycemia with uncontrolled diabetes a. 10 units FA insulin now. b. SSI, LA insulin 6. GERD  DVT prophylaxis: Eliquis Consultants: Cardiology Code Status: Full code Family Communication:   Disposition Plan: pending   Truett Mainland, DO

## 2020-11-20 NOTE — ED Notes (Signed)
Report given to [facility] 

## 2020-11-21 ENCOUNTER — Inpatient Hospital Stay (HOSPITAL_COMMUNITY): Payer: 59

## 2020-11-21 DIAGNOSIS — E1122 Type 2 diabetes mellitus with diabetic chronic kidney disease: Secondary | ICD-10-CM

## 2020-11-21 DIAGNOSIS — R609 Edema, unspecified: Secondary | ICD-10-CM | POA: Diagnosis not present

## 2020-11-21 DIAGNOSIS — R778 Other specified abnormalities of plasma proteins: Secondary | ICD-10-CM

## 2020-11-21 DIAGNOSIS — L03116 Cellulitis of left lower limb: Secondary | ICD-10-CM

## 2020-11-21 DIAGNOSIS — I5043 Acute on chronic combined systolic (congestive) and diastolic (congestive) heart failure: Secondary | ICD-10-CM

## 2020-11-21 DIAGNOSIS — N183 Chronic kidney disease, stage 3 unspecified: Secondary | ICD-10-CM

## 2020-11-21 LAB — TROPONIN I (HIGH SENSITIVITY)
Troponin I (High Sensitivity): 455 ng/L (ref <18)
Troponin I (High Sensitivity): 467 ng/L (ref <18)
Troponin I (High Sensitivity): 390 ng/L (ref <18)

## 2020-11-21 LAB — BASIC METABOLIC PANEL
Anion gap: 10 (ref 5–15)
BUN: 28 mg/dL — ABNORMAL HIGH (ref 6–20)
CO2: 32 mmol/L (ref 22–32)
Calcium: 8.7 mg/dL — ABNORMAL LOW (ref 8.9–10.3)
Chloride: 94 mmol/L — ABNORMAL LOW (ref 98–111)
Creatinine, Ser: 1.27 mg/dL — ABNORMAL HIGH (ref 0.61–1.24)
GFR, Estimated: >60 mL/min (ref >60)
Glucose, Bld: 93 mg/dL (ref 70–99)
Potassium: 4 mmol/L (ref 3.5–5.1)
Sodium: 136 mmol/L (ref 135–145)

## 2020-11-21 LAB — CBC
HCT: 28.1 % — ABNORMAL LOW (ref 39.0–52.0)
Hemoglobin: 8.8 g/dL — ABNORMAL LOW (ref 13.0–17.0)
MCH: 27.8 pg (ref 26.0–34.0)
MCHC: 31.3 g/dL (ref 30.0–36.0)
MCV: 88.9 fL (ref 80.0–100.0)
Platelets: 469 10*3/uL — ABNORMAL HIGH (ref 150–400)
RBC: 3.16 MIL/uL — ABNORMAL LOW (ref 4.22–5.81)
RDW: 15.9 % — ABNORMAL HIGH (ref 11.5–15.5)
WBC: 6.9 10*3/uL (ref 4.0–10.5)
nRBC: 0 % (ref 0.0–0.2)

## 2020-11-21 LAB — GLUCOSE, CAPILLARY
Glucose-Capillary: 134 mg/dL — ABNORMAL HIGH (ref 70–99)
Glucose-Capillary: 109 mg/dL — ABNORMAL HIGH (ref 70–99)
Glucose-Capillary: 175 mg/dL — ABNORMAL HIGH (ref 70–99)
Glucose-Capillary: 187 mg/dL — ABNORMAL HIGH (ref 70–99)

## 2020-11-21 LAB — PROCALCITONIN: Procalcitonin: 0.11 ng/mL

## 2020-11-21 MED ORDER — DOXYCYCLINE HYCLATE 100 MG PO TABS
100.0000 mg | ORAL_TABLET | Freq: Two times a day (BID) | ORAL | Status: DC
  Administered 2020-11-21 – 2020-11-24 (×7): 100 mg via ORAL
  Filled 2020-11-21 (×7): qty 1

## 2020-11-21 MED ORDER — CEPHALEXIN 500 MG PO CAPS
500.0000 mg | ORAL_CAPSULE | Freq: Three times a day (TID) | ORAL | Status: DC
  Administered 2020-11-21 – 2020-11-24 (×11): 500 mg via ORAL
  Filled 2020-11-21 (×11): qty 1

## 2020-11-21 MED ORDER — OXYCODONE HCL 5 MG PO TABS
5.0000 mg | ORAL_TABLET | Freq: Four times a day (QID) | ORAL | Status: AC | PRN
Start: 2020-11-21 — End: 2020-11-22
  Administered 2020-11-21 – 2020-11-22 (×2): 5 mg via ORAL
  Filled 2020-11-21 (×2): qty 1

## 2020-11-21 NOTE — Progress Notes (Signed)
Lower extremity venous has been completed.   Preliminary results in CV Proc.   Abram Sander 11/21/2020 2:21 PM

## 2020-11-21 NOTE — Progress Notes (Signed)
[facility] PROGRESS NOTE    Progress Note  LELA GELL  LEX:[id number] DOB: 1965-10-12 DOA: 11/20/2020 PCP: [name], DO     Brief Narrative:   CHANSE KAGEL is an 55 y.o. 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% on echocardiogram on 10/26/2020, history of an MI DVT on anticoagulation recently discharged from the hospital on 11/18/2020 for heart failure exacerbation he was diuresed with 50 pounds on discharge his weight was 108 pounds discharge to skilled nursing facility comes back with shortness of breath and bilateral leg swelling  Significant Events: 11/20/2020 admission  Significant studies: 06/20/2021 chest x-ray showed pulmonary vascular congestion 10/26/2020 echocardiogram that showed an EF of 35%, moderate decreased function with global hypokinesia  Antibiotics: None  Microbiology data: Blood culture:  Procedures: None  Assessment/Plan:   Dyspnea on exertion: Patient is a poor historian even though we try to drill down his symptoms he cannot be specific enough. No events on telemetry.  On admission he was waiting about 94.7 kg, on admission he is 92.2 kg. Brain natruretic peptide 500, his chest x-ray shows pulmonary vascular congestion cardiac biomarkers have basically remained flat. Afebrile with no leukocytosis. He was started on Lasix IV twice a day with potassium supplementation. Mild fluid overload on physical exam, cont IV lasix. Unlikely this is contributing to his vague symptoms. Consult physical therapy. He is not tachycardic not dyspneic, he is on Eliquis which makes thromboembolic event unlikely.  Evaded troponin's: Denies any chest pain, they have basically remained flat. In the setting of chronic renal disease.  Possible cellulitis of the lower extremities: Has multiple abrasions erythema on the left foot is warm to touch and  slightly swollen. He was supposed to be on Keflex and doxycycline as an outpatient for possible superimposed cellulitis which she was not taking. We will start him empirically on doxy as an inpatient.  Essential hypertension with coronary artery disease: Continue antihypertensive medication.  Chronic kidney disease stage IIIb: With a baseline creatinine of around 1.2-1.5 creatinine appears stable.  Uncontrolled diabetes mellitus type 2 with hyperglycemia: Agree with Lantus plus sliding scale sniffily improved as this morning 93.  Accidental COVID-19 infection: He was positive on1/27/2022 he was asymptomatic at that time. Check D-dimer and CRP.  History of wide-complex tachycardia and possible atypical flutter: Cardiology was consulted on last admission felt not to be a candidate for an AICD due to medication noncompliance. He was continued on Toprol and Eliquis.  Acute confusional state: Seroquel at night.  DVT prophylaxis: Eliquis Family Communication:None Status is: Inpatient  Remains inpatient appropriate because:Hemodynamically unstable   Dispo: The patient is from: SNF              Anticipated d/c is to: SNF              Anticipated d/c date is: 1 day              Patient currently is not medically stable to d/c.   Difficult to place patient No        Code Status:     Code Status Orders  (From admission, onward)         Start     Ordered   11/20/20 2106  Full code  Continuous        11/20/20 2105        Code Status History    Date Active  Date Inactive Code Status Order ID Comments User Context   10/22/2020 1221 11/18/2020 2330 Full Code [id number]  Rodena Goldmann, DO ED   09/28/2020 2325 09/30/2020 0514 Full Code [id number]  Bernadette Hoit, DO ED   09/27/2020 0111 09/28/2020 0413 Full Code [id number]  Bernadette Hoit, DO Inpatient   07/23/2020 1508 07/24/2020 1841 Full Code [id number]  Murlean Iba, MD ED   06/29/2020 2224 06/30/2020 1424 Full  Code [id number]  Arrien, Jimmy Picket, MD ED   03/19/2019 2132 03/21/2019 1905 Full Code [id number]  Bethena Roys, MD Inpatient   03/17/2019 1647 03/17/2019 2251 Full Code [id number]  Reubin Milan, MD ED   04/24/2018 0942 04/25/2018 1725 Full Code [id number]  Murlean Iba, MD Inpatient   01/26/2014 1710 01/28/2014 0200 Full Code [id number]  Janice Norrie, MD ED   11/24/2012 2307 11/26/2012 2059 Full Code [id number]  Acquanetta Chain, DO Inpatient   08/09/2012 1901 08/10/2012 2029 Full Code [id number]  Alfonzo Feller, DO ED   08/04/2012 0044 08/05/2012 1944 Full Code [id number]  Theressa Millard, MD ED   07/03/2012 0351 07/05/2012 1312 Full Code [id number]  Fara Boros, RN Inpatient   01/22/2012 0131 01/23/2012 2120 Full Code [id number]  Jacolyn Reedy, MD Inpatient   Advance Care Planning Activity        IV Access:    Peripheral IV   Procedures and diagnostic studies:   DG Chest Portable 1 View  Result Date: 11/20/2020 CLINICAL DATA:  Dyspnea and weakness EXAM: PORTABLE CHEST 1 VIEW COMPARISON:  10/26/2020 chest radiograph. FINDINGS: Right rotated chest radiograph. Stable cardiomediastinal silhouette with normal heart size. No pneumothorax. No pleural effusion. No overt pulmonary edema. No acute consolidative airspace disease. IMPRESSION: No active disease. Electronically Signed   By: Ilona Sorrel M.D.   On: 11/20/2020 14:00     Medical Consultants:    None.   Subjective:    Atharv Barriere Sturdy complaining that everything is hurting does not feel good.  He cannot provide any other history besides saying I do not feel good  Objective:    Vitals:   11/20/20 2133 11/20/20 2327 11/21/20 0300 11/21/20 0418  BP: 109/69 113/79  (!) 95/59  Pulse: 87     Resp: 18 17  18   Temp: 97.9 F (36.6 C) 97.7 F (36.5 C)  97.8 F (36.6 C)  TempSrc: Oral Oral  Oral  SpO2: 100% 97%  98%  Weight: 92.2 kg  92.2 kg   Height:       SpO2: 98 %   Intake/Output  Summary (Last 24 hours) at 11/21/2020 0658 Last data filed at 11/20/2020 2330 Gross per 24 hour  Intake --  Output 500 ml  Net -500 ml   Filed Weights   11/20/20 1426 11/20/20 2133 11/21/20 0300  Weight: 93.3 kg 92.2 kg 92.2 kg    Exam: General exam: In no acute distress. Respiratory system: Good air movement and clear to auscultation. Cardiovascular system: S1 & S2 heard, RRR.  Positive hepatojugular reflex Gastrointestinal system: Abdomen is nondistended, soft and nontender.  Central nervous system: Alert and oriented. No focal neurological deficits. Extremities: No pedal edema. Skin: Bilateral lower extremity edema with asymmetric swelling left greater than right and multiple necrotic eschars Psychiatry: Judgement and insight appear normal. Mood & affect appropriate.    Data Reviewed:    Labs: Basic Metabolic Panel: Recent Labs  Lab 11/15/20 0400 11/18/20 0758 11/20/20 1253 11/21/20 0227  NA 133* 129* 130* 136  K 4.4 4.3 4.0 4.0  CL 91* 92* 91* 94*  CO2 30 30 31  32  GLUCOSE 125* 185* 438* 93  BUN 43* 34* 34* 28*  CREATININE 1.64* 1.39* 1.43* 1.27*  CALCIUM 8.7* 8.3* 8.3* 8.7*   GFR Estimated Creatinine Clearance: 76.4 mL/min (A) (by C-G formula based on SCr of 1.27 mg/dL (H)). Liver Function Tests: Recent Labs  Lab 11/18/20 0758 11/20/20 1253  AST 59* 44*  ALT 101* 77*  ALKPHOS 198* 175*  BILITOT 1.0 1.5*  PROT 6.7 6.8  ALBUMIN 2.2* 2.2*   Recent Labs  Lab 11/20/20 1253  LIPASE 19   No results for input(s): AMMONIA in the last 168 hours. Coagulation profile No results for input(s): INR, PROTIME in the last 168 hours. COVID-19 Labs  No results for input(s): DDIMER, FERRITIN, LDH, CRP in the last 72 hours.  Lab Results  Component Value Date   SARSCOV2NAA POSITIVE (A) 11/14/2020   SARSCOV2NAA POSITIVE (A) 11/04/2020   SARSCOV2NAA NEGATIVE 10/22/2020   [place] NEGATIVE 09/28/2020    CBC: Recent Labs  Lab 11/18/20 0758 11/20/20 1253  11/21/20 0227  WBC 10.8* 8.9 6.9  NEUTROABS  --  6.8  --   HGB 9.8* 9.3* 8.8*  HCT 30.9* 29.5* 28.1*  MCV 92.2 91.3 88.9  PLT 529* 455* 469*   Cardiac Enzymes: No results for input(s): CKTOTAL, CKMB, CKMBINDEX, TROPONINI in the last 168 hours. BNP (last 3 results) No results for input(s): PROBNP in the last 8760 hours. CBG: Recent Labs  Lab 11/18/20 0822 11/18/20 1134 11/18/20 1633 11/20/20 1229 11/20/20 2304  GLUCAP 175* 172* 129* 425* 128*   D-Dimer: No results for input(s): DDIMER in the last 72 hours. Hgb A1c: No results for input(s): HGBA1C in the last 72 hours. Lipid Profile: No results for input(s): CHOL, HDL, LDLCALC, TRIG, CHOLHDL, LDLDIRECT in the last 72 hours. Thyroid function studies: No results for input(s): TSH, T4TOTAL, T3FREE, THYROIDAB in the last 72 hours.  Invalid input(s): FREET3 Anemia work up: No results for input(s): VITAMINB12, FOLATE, FERRITIN, TIBC, IRON, RETICCTPCT in the last 72 hours. Sepsis Labs: Recent Labs  Lab 11/18/20 0758 11/20/20 1253 11/21/20 0227  WBC 10.8* 8.9 6.9   Microbiology Recent Results (from the past 240 hour(s))  SARS CORONAVIRUS 2 (TAT 6-24 HRS) Nasopharyngeal Nasopharyngeal Swab     Status: Abnormal   Collection Time: 11/14/20  1:26 PM   Specimen: Nasopharyngeal Swab  Result Value Ref Range Status   SARS Coronavirus 2 POSITIVE (A) NEGATIVE Final    Comment: (NOTE) SARS-CoV-2 target nucleic acids are DETECTED.  The SARS-CoV-2 RNA is generally detectable in upper and lower respiratory specimens during the acute phase of infection. Positive results are indicative of the presence of SARS-CoV-2 RNA. Clinical correlation with patient history and other diagnostic information is  necessary to determine patient infection status. Positive results do not rule out bacterial infection or co-infection with other viruses.  The expected result is Negative.  Fact Sheet for  Patients: SugarRoll.be  Fact Sheet for Healthcare Providers: https://www.woods-mathews.com/  This test is not yet approved or cleared by the [place] FDA and  has been authorized for detection and/or diagnosis of SARS-CoV-2 by FDA under an Emergency Use Authorization (EUA). This EUA will remain  in effect (meaning this test can be used) for the duration of the COVID-19 declaration under Section 564(b)(1) of the Act, 21 U. S.C. section 360bbb-3(b)(1), unless the authorization is terminated or revoked sooner.   Performed at [facility], [address] [place], [place] [address]      Medications:   . apixaban  5 mg Oral BID  . aspirin EC  81 mg Oral Daily  . atorvastatin  80 mg Oral Daily  . furosemide  80 mg Intravenous BID  . gabapentin  300 mg Oral QHS  . insulin aspart  0-20 Units Subcutaneous TID WC  . insulin aspart  0-5 Units Subcutaneous QHS  . insulin glargine  12 Units Subcutaneous QPM  . mometasone-formoterol  2 puff Inhalation BID  . pantoprazole  40 mg Oral Daily  . potassium chloride  40 mEq Oral BID  . sacubitril-valsartan  1 tablet Oral BID  . traZODone  150 mg Oral QHS   Continuous Infusions:    LOS: 1 day   Charlynne Cousins  [facility]  11/21/2020, 6:58 AM

## 2020-11-21 NOTE — Progress Notes (Signed)
CRITICAL VALUE ALERT  Critical Value:  Troponin 455  Date & Time Notied:  11/21/20 0245  Provider Notified: Dr Jeronimo Greaves  Orders Received/Actions taken: No new orders.

## 2020-11-22 LAB — GLUCOSE, CAPILLARY
Glucose-Capillary: 279 mg/dL — ABNORMAL HIGH (ref 70–99)
Glucose-Capillary: 219 mg/dL — ABNORMAL HIGH (ref 70–99)
Glucose-Capillary: 189 mg/dL — ABNORMAL HIGH (ref 70–99)
Glucose-Capillary: 235 mg/dL — ABNORMAL HIGH (ref 70–99)

## 2020-11-22 LAB — BASIC METABOLIC PANEL
Anion gap: 9 (ref 5–15)
BUN: 25 mg/dL — ABNORMAL HIGH (ref 6–20)
CO2: 31 mmol/L (ref 22–32)
Calcium: 8.6 mg/dL — ABNORMAL LOW (ref 8.9–10.3)
Chloride: 92 mmol/L — ABNORMAL LOW (ref 98–111)
Creatinine, Ser: 1.31 mg/dL — ABNORMAL HIGH (ref 0.61–1.24)
GFR, Estimated: >60 mL/min (ref >60)
Glucose, Bld: 257 mg/dL — ABNORMAL HIGH (ref 70–99)
Potassium: 4 mmol/L (ref 3.5–5.1)
Sodium: 132 mmol/L — ABNORMAL LOW (ref 135–145)

## 2020-11-22 LAB — PROCALCITONIN: Procalcitonin: <0.1 ng/mL

## 2020-11-22 MED ORDER — TORSEMIDE 20 MG PO TABS
40.0000 mg | ORAL_TABLET | Freq: Two times a day (BID) | ORAL | Status: DC
  Administered 2020-11-22 – 2020-11-23 (×2): 40 mg via ORAL
  Filled 2020-11-22 (×2): qty 2

## 2020-11-22 MED ORDER — INSULIN GLARGINE 100 UNIT/ML ~~LOC~~ SOLN
12.0000 [IU] | Freq: Two times a day (BID) | SUBCUTANEOUS | Status: DC
  Administered 2020-11-22 – 2020-11-23 (×4): 12 [IU] via SUBCUTANEOUS
  Filled 2020-11-22 (×4): qty 0.12

## 2020-11-22 MED ORDER — COLLAGENASE 250 UNIT/GM EX OINT
TOPICAL_OINTMENT | Freq: Every day | CUTANEOUS | Status: DC
  Filled 2020-11-22: qty 30

## 2020-11-22 MED ORDER — AQUAPHOR EX OINT
TOPICAL_OINTMENT | Freq: Every day | CUTANEOUS | Status: DC | PRN
  Filled 2020-11-22: qty 50

## 2020-11-22 NOTE — Progress Notes (Signed)
[facility] PROGRESS NOTE    Progress Note  Dustin Cruz  EZM:[id number] DOB: 10-16-1965 DOA: 11/20/2020 PCP: [name], DO     Brief Narrative:   Dustin Cruz is an 55 y.o. 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% on echocardiogram on 10/26/2020, history of an MI DVT on anticoagulation recently discharged from the hospital on 11/18/2020 for heart failure exacerbation he was diuresed with 50 pounds on discharge his weight was 108 pounds discharge to skilled nursing facility comes back with shortness of breath and bilateral leg swelling  Significant Events: 11/20/2020 admission  Significant studies: 06/20/2021 chest x-ray showed pulmonary vascular congestion 10/26/2020 echocardiogram that showed an EF of 35%, moderate decreased function with global hypokinesia  Antibiotics: None  Microbiology data: Blood culture:  Procedures: None  Assessment/Plan:   Dyspnea on exertion: Patient is a poor historian even though we try to drill down his symptoms he cannot be specific enough. No events on telemetry.  He was started back on his IV Lasix and potassium supplementation his creatinine is trending up. He appears euvolemic today on physical exam we will transition to oral Lasix. Unlikely volume overload contributing to his symptoms. Lower extremity Doppler negative for DVT.  Elavated troponin's: Denies any chest pain, they have basically remained flat. In the setting of chronic renal disease.  Possible cellulitis of the lower extremities: Has multiple abrasions erythema on the left foot is warm to touch and slightly swollen. T-max was 97.9 he has remained afebrile. Continue oral doxycycline for total of 7 days. Consult wound care.  Essential hypertension with coronary artery disease: Blood pressure is well controlled continue antihypertensive  medication.  Chronic kidney disease stage IIIb: With a baseline creatinine of around 1.2-1.5 creatinine appears stable.  Uncontrolled diabetes mellitus type 2 with hyperglycemia: Blood glucose trending up we will double his Lantus to 12 units twice a day continue sliding scale.  Accidental COVID-19 infection: He was positive on1/27/2022 he was asymptomatic at that time. Check D-dimer and CRP.  History of wide-complex tachycardia and possible atypical flutter: Cardiology was consulted on last admission felt not to be a candidate for an AICD due to medication noncompliance. He was continued on Toprol and Eliquis.  Acute confusional state: Seroquel at night.  DVT prophylaxis: Eliquis Family Communication:None Status is: Inpatient  Remains inpatient appropriate because:Hemodynamically unstable   Dispo: The patient is from: SNF              Anticipated d/c is to: SNF              Anticipated d/c date is: 1 day              Patient currently is not medically stable to d/c.   Difficult to place patient No        Code Status:     Code Status Orders  (From admission, onward)         Start     Ordered   11/20/20 2106  Full code  Continuous        11/20/20 2105        Code Status History    Date Active Date Inactive Code Status Order ID Comments User Context   10/22/2020 1221 11/18/2020 2330 Full Code [id number]  Rodena Goldmann, DO ED   09/28/2020 2325 09/30/2020 0514 Full Code [id number]  Bernadette Hoit, DO ED   09/27/2020 0111  09/28/2020 0413 Full Code [id number]  Bernadette Hoit, DO Inpatient   07/23/2020 1508 07/24/2020 1841 Full Code [id number]  Murlean Iba, MD ED   06/29/2020 2224 06/30/2020 1424 Full Code [id number]  Tawni Millers, MD ED   03/19/2019 2132 03/21/2019 1905 Full Code [id number]  Bethena Roys, MD Inpatient   03/17/2019 1647 03/17/2019 2251 Full Code [id number]  Reubin Milan, MD ED   04/24/2018 0942 04/25/2018 1725 Full Code  [id number]  Murlean Iba, MD Inpatient   01/26/2014 1710 01/28/2014 0200 Full Code [id number]  Janice Norrie, MD ED   11/24/2012 2307 11/26/2012 2059 Full Code [id number]  Acquanetta Chain, DO Inpatient   08/09/2012 1901 08/10/2012 2029 Full Code [id number]  Alfonzo Feller, DO ED   08/04/2012 0044 08/05/2012 1944 Full Code [id number]  Theressa Millard, MD ED   07/03/2012 0351 07/05/2012 1312 Full Code [id number]  Fara Boros, RN Inpatient   01/22/2012 0131 01/23/2012 2120 Full Code [id number]  Jacolyn Reedy, MD Inpatient   Advance Care Planning Activity        IV Access:    Peripheral IV   Procedures and diagnostic studies:   DG Chest Portable 1 View  Result Date: 11/20/2020 CLINICAL DATA:  Dyspnea and weakness EXAM: PORTABLE CHEST 1 VIEW COMPARISON:  10/26/2020 chest radiograph. FINDINGS: Right rotated chest radiograph. Stable cardiomediastinal silhouette with normal heart size. No pneumothorax. No pleural effusion. No overt pulmonary edema. No acute consolidative airspace disease. IMPRESSION: No active disease. Electronically Signed   By: Ilona Sorrel M.D.   On: 11/20/2020 14:00   VAS [place] LOWER EXTREMITY VENOUS (DVT)  Result Date: 11/21/2020  Lower Venous DVT Study Indications: Asymetric edema.  Limitations: Patient positioning. Comparison Study: no prior Performing Technologist: Abram Sander RVS  Examination Guidelines: A complete evaluation includes B-mode imaging, spectral Doppler, color Doppler, and power Doppler as needed of all accessible portions of each vessel. Bilateral testing is considered an integral part of a complete examination. Limited examinations for reoccurring indications may be performed as noted. The reflux portion of the exam is performed with the patient in reverse Trendelenburg.  +---------+---------------+---------+-----------+----------+-------------------+ RIGHT    CompressibilityPhasicitySpontaneityPropertiesThrombus Aging       +---------+---------------+---------+-----------+----------+-------------------+ CFV      Full           Yes      Yes                                      +---------+---------------+---------+-----------+----------+-------------------+ SFJ      Full                                                             +---------+---------------+---------+-----------+----------+-------------------+ FV Prox  Full                                                             +---------+---------------+---------+-----------+----------+-------------------+ FV Mid   Full                                                             +---------+---------------+---------+-----------+----------+-------------------+  FV DistalFull                                                             +---------+---------------+---------+-----------+----------+-------------------+ PFV      Full                                                             +---------+---------------+---------+-----------+----------+-------------------+ POP      Full           Yes      Yes                                      +---------+---------------+---------+-----------+----------+-------------------+ PTV      Full                                                             +---------+---------------+---------+-----------+----------+-------------------+ PERO                                                  Not well visualized +---------+---------------+---------+-----------+----------+-------------------+   +---------+---------------+---------+-----------+----------+--------------+ LEFT     CompressibilityPhasicitySpontaneityPropertiesThrombus Aging +---------+---------------+---------+-----------+----------+--------------+ CFV      Full           Yes      Yes                                 +---------+---------------+---------+-----------+----------+--------------+ SFJ      Full                                                         +---------+---------------+---------+-----------+----------+--------------+ FV Prox  Full                                                        +---------+---------------+---------+-----------+----------+--------------+ FV Mid   Full                                                        +---------+---------------+---------+-----------+----------+--------------+ FV DistalFull                                                        +---------+---------------+---------+-----------+----------+--------------+  PFV      Full                                                        +---------+---------------+---------+-----------+----------+--------------+ POP      Full           Yes      Yes                                 +---------+---------------+---------+-----------+----------+--------------+ PTV      Full                                                        +---------+---------------+---------+-----------+----------+--------------+ PERO     Full                                                        +---------+---------------+---------+-----------+----------+--------------+     Summary: BILATERAL: - No evidence of deep vein thrombosis seen in the lower extremities, bilaterally. - No evidence of superficial venous thrombosis in the lower extremities, bilaterally. -No evidence of popliteal cyst, bilaterally.   *See table(s) above for measurements and observations. Electronically signed by Harold Barban MD on 11/21/2020 at 7:18:33 PM.    Final      Medical Consultants:    None.   Subjective:    Roshad Hack Glasper relates he feels much better today is not hurting as bad.  Breathing is at baseline.  Objective:    Vitals:   11/22/20 0045 11/22/20 0442 11/22/20 0732 11/22/20 0800  BP: 121/72 118/78  118/77  Pulse:    85  Resp: 15 20  18   Temp: 97.9 F (36.6 C) 97.7 F (36.5 C)  97.9 F (36.6 C)  TempSrc: Oral  Oral  Oral  SpO2:   97%   Weight:  88.7 kg    Height:       SpO2: 97 %   Intake/Output Summary (Last 24 hours) at 11/22/2020 0939 Last data filed at 11/22/2020 0800 Gross per 24 hour  Intake 240 ml  Output 1450 ml  Net -1210 ml   Filed Weights   11/20/20 2133 11/21/20 0300 11/22/20 0442  Weight: 92.2 kg 92.2 kg 88.7 kg    Exam: General exam: In no acute distress. Respiratory system: Good air movement and clear to auscultation. Cardiovascular system: S1 & S2 heard, RRR. No JVD. Gastrointestinal system: Abdomen is nondistended, soft and nontender.  Central nervous system: Alert and oriented. No focal neurological deficits. Extremities: No pedal edema. Skin: Bilateral lower extremity woody edema no with asymmetric swelling and multiple necrotic eschars. Psychiatry: Judgement and insight appear normal. Mood & affect appropriate.   Data Reviewed:    Labs: Basic Metabolic Panel: Recent Labs  Lab 11/18/20 0758 11/20/20 1253 11/21/20 0227 11/22/20 0721  NA 129* 130* 136 132*  K 4.3 4.0 4.0 4.0  CL 92* 91* 94* 92*  CO2 30 31 32 31  GLUCOSE 185* 438*  93 257*  BUN 34* 34* 28* 25*  CREATININE 1.39* 1.43* 1.27* 1.31*  CALCIUM 8.3* 8.3* 8.7* 8.6*   GFR Estimated Creatinine Clearance: 74.1 mL/min (A) (by C-G formula based on SCr of 1.31 mg/dL (H)). Liver Function Tests: Recent Labs  Lab 11/18/20 0758 11/20/20 1253  AST 59* 44*  ALT 101* 77*  ALKPHOS 198* 175*  BILITOT 1.0 1.5*  PROT 6.7 6.8  ALBUMIN 2.2* 2.2*   Recent Labs  Lab 11/20/20 1253  LIPASE 19   No results for input(s): AMMONIA in the last 168 hours. Coagulation profile No results for input(s): INR, PROTIME in the last 168 hours. COVID-19 Labs  No results for input(s): DDIMER, FERRITIN, LDH, CRP in the last 72 hours.  Lab Results  Component Value Date   SARSCOV2NAA POSITIVE (A) 11/14/2020   SARSCOV2NAA POSITIVE (A) 11/04/2020   SARSCOV2NAA NEGATIVE 10/22/2020   [place] NEGATIVE 09/28/2020     CBC: Recent Labs  Lab 11/18/20 0758 11/20/20 1253 11/21/20 0227  WBC 10.8* 8.9 6.9  NEUTROABS  --  6.8  --   HGB 9.8* 9.3* 8.8*  HCT 30.9* 29.5* 28.1*  MCV 92.2 91.3 88.9  PLT 529* 455* 469*   Cardiac Enzymes: No results for input(s): CKTOTAL, CKMB, CKMBINDEX, TROPONINI in the last 168 hours. BNP (last 3 results) No results for input(s): PROBNP in the last 8760 hours. CBG: Recent Labs  Lab 11/21/20 0738 11/21/20 1356 11/21/20 1622 11/21/20 2120 11/22/20 0755  GLUCAP 134* 109* 175* 187* 279*   D-Dimer: No results for input(s): DDIMER in the last 72 hours. Hgb A1c: No results for input(s): HGBA1C in the last 72 hours. Lipid Profile: No results for input(s): CHOL, HDL, LDLCALC, TRIG, CHOLHDL, LDLDIRECT in the last 72 hours. Thyroid function studies: No results for input(s): TSH, T4TOTAL, T3FREE, THYROIDAB in the last 72 hours.  Invalid input(s): FREET3 Anemia work up: No results for input(s): VITAMINB12, FOLATE, FERRITIN, TIBC, IRON, RETICCTPCT in the last 72 hours. Sepsis Labs: Recent Labs  Lab 11/18/20 0758 11/20/20 1253 11/21/20 0227 11/21/20 0750 11/22/20 0721  PROCALCITON  --   --   --  0.11 <0.10  WBC 10.8* 8.9 6.9  --   --    Microbiology Recent Results (from the past 240 hour(s))  SARS CORONAVIRUS 2 (TAT 6-24 HRS) Nasopharyngeal Nasopharyngeal Swab     Status: Abnormal   Collection Time: 11/14/20  1:26 PM   Specimen: Nasopharyngeal Swab  Result Value Ref Range Status   SARS Coronavirus 2 POSITIVE (A) NEGATIVE Final    Comment: (NOTE) SARS-CoV-2 target nucleic acids are DETECTED.  The SARS-CoV-2 RNA is generally detectable in upper and lower respiratory specimens during the acute phase of infection. Positive results are indicative of the presence of SARS-CoV-2 RNA. Clinical correlation with patient history and other diagnostic information is  necessary to determine patient infection status. Positive results do not rule out bacterial infection  or co-infection with other viruses.  The expected result is Negative.  Fact Sheet for Patients: SugarRoll.be  Fact Sheet for Healthcare Providers: https://www.woods-mathews.com/  This test is not yet approved or cleared by the [place] FDA and  has been authorized for detection and/or diagnosis of SARS-CoV-2 by FDA under an Emergency Use Authorization (EUA). This EUA will remain  in effect (meaning this test can be used) for the duration of the COVID-19 declaration under Section 564(b)(1) of the Act, 21 U. S.C. section 360bbb-3(b)(1), unless the authorization is terminated or revoked sooner.   Performed at [facility]  Lab, [address], [place], [place] [address]      Medications:   . apixaban  5 mg Oral BID  . aspirin EC  81 mg Oral Daily  . atorvastatin  80 mg Oral Daily  . cephALEXin  500 mg Oral Q8H  . doxycycline  100 mg Oral Q12H  . gabapentin  300 mg Oral QHS  . insulin aspart  0-20 Units Subcutaneous TID WC  . insulin aspart  0-5 Units Subcutaneous QHS  . insulin glargine  12 Units Subcutaneous QPM  . mometasone-formoterol  2 puff Inhalation BID  . pantoprazole  40 mg Oral Daily  . potassium chloride  40 mEq Oral BID  . sacubitril-valsartan  1 tablet Oral BID  . traZODone  150 mg Oral QHS   Continuous Infusions:    LOS: 2 days   Charlynne Cousins  [facility]  11/22/2020, 9:39 AM

## 2020-11-22 NOTE — NC FL2 (Signed)
[place] MEDICAID FL2 LEVEL OF CARE SCREENING TOOL     IDENTIFICATION  Patient Name: Dustin Cruz Birthdate: June 28, 1966 Sex: male Admission Date (Current Location): 11/20/2020  [facility] and [place] Number:  Herbalist and Address:  [facility] [place] [facility]. [facility], [place] [address], [place], [place] [address]      Provider Number: [id number]  Attending Physician Name and Address:  Charlynne Cousins, MD  Relative Name and Phone Number:  Delfina Redwood [phone]    Current Level of Care: Hospital Recommended Level of Care: [place] Prior Approval Number:    Date Approved/Denied:   PASRR Number: [id number]  Discharge Plan: SNF    Current Diagnoses: Patient Active Problem List   Diagnosis Date Noted  . Elevated troponin 11/20/2020  . History of DVT (deep vein thrombosis) 11/20/2020  . COVID 11/05/2020  . Acute on chronic diastolic (congestive) heart failure ([place]) 10/22/2020  . Acute on chronic combined systolic and diastolic CHF (congestive heart failure) ([place]) 09/28/2020  . Peripheral edema 09/27/2020  . Acute on chronic systolic (congestive) heart failure ([place]) 09/27/2020  . Scrotum swelling 09/26/2020  . Acute diastolic CHF (congestive heart failure) ([place]) 07/23/2020  . Abnormal weight gain 07/23/2020  . Noncompliance 07/23/2020  . Pelvic lymphadenopathy 07/20/2020  . Acute on chronic diastolic CHF (congestive heart failure) ([place]) 06/30/2020  . CHF (congestive heart failure) ([place]) 06/29/2020  . Pre-ulcerative calluses 10/29/2019  . Musculoskeletal pain 10/13/2019  . Housing problems 05/23/2019  . Essential hypertension, benign 03/20/2019  . 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 Near syncope 03/17/2019  . AKI (acute kidney injury) ([place]) 03/17/2019  . 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 CKD stage 3 due to type 2 diabetes mellitus ([place]) 11/28/2018  . Microalbuminuria due to type 2 diabetes mellitus ([place]) 05/22/2018  . Hyperglycemia due to diabetes  mellitus ([place]) 04/24/2018  . Abdominal pain, epigastric 11/29/2017  . Diarrhea 11/29/2017  . History of colonic polyps 11/29/2017  . Diffuse abdominal pain 11/29/2017  . Diabetic retinopathy ([place]) 09/21/2015  . Carotid bruit 09/21/2015  . Viral gastroenteritis 06/03/2015  . Hypertriglyceridemia 05/18/2015  . Vitamin D deficiency 12/29/2013  . Nausea alone 04/24/2013  . Frequency of urination 04/14/2013  . Fever, unspecified 04/14/2013  . Hepatic steatosis 12/28/2012  . Peripheral neuropathy 11/25/2012  . Melanoma of skin, site unspecified 11/25/2012  . Mixed hyperlipidemia   . Depression   . Hypertension associated with diabetes ([place])   . GERD (gastroesophageal reflux disease) 04/03/2012  . Current smoker 03/14/2012  . Type 2 diabetes mellitus with stage 3a chronic kidney disease, with long-term current use of insulin ([place])   . CAD (coronary artery disease) 01/06/2011    Orientation RESPIRATION BLADDER Height & Weight     Self,Place,Time  Normal Continent Weight: 195 lb 8.8 oz (88.7 kg) Height:  6\' 2"  (188 cm)  BEHAVIORAL SYMPTOMS/MOOD NEUROLOGICAL BOWEL NUTRITION STATUS      Continent Diet (see discharge summary)  AMBULATORY STATUS COMMUNICATION OF NEEDS Skin   Limited Assist Verbally Skin abrasions,Other (Comment) (abrasion arm hand bilateral,cracking heel bilateral,ecchymosis arm leg bilateral,rash groin bilateral)                       Personal Care Assistance Level of Assistance  Bathing,Feeding,Dressing Bathing Assistance: Limited assistance Feeding assistance: Independent (able to feed self) Dressing Assistance: Limited assistance     Functional Limitations Info  Sight,Hearing,Speech Sight Info: Adequate Hearing Info: Adequate Speech Info: Adequate    SPECIAL CARE FACTORS FREQUENCY  PT (By licensed PT),OT (By licensed OT)     PT Frequency: 5x min weekly OT Frequency: 5x min weekly            Contractures      Additional Factors Info  Code  Status,Insulin Sliding Scale Code Status Info: FULL Allergies Info: no known allergies Psychotropic Info: traZODone (DESYREL) tablet 150 mg daily at bedtime Insulin Sliding Scale Info: insulin aspart (novoLOG) injection 0-20 Units 3 times daily with meals,insulin aspart (novoLOG) injection 0-5 Units daily at bedtime,nsulin glargine (LANTUS) injection 12 Units 2 times daily       Current Medications (11/22/2020):  This is the current hospital active medication list Current Facility-Administered Medications  Medication Dose Route Frequency Provider Last Rate Last Admin  . apixaban (ELIQUIS) tablet 5 mg  5 mg Oral BID Truett Mainland, DO   5 mg at 11/22/20 0820  . aspirin EC tablet 81 mg  81 mg Oral Daily Truett Mainland, DO   81 mg at 11/22/20 0820  . atorvastatin (LIPITOR) tablet 80 mg  80 mg Oral Daily Truett Mainland, DO   80 mg at 11/22/20 2094  . cephALEXin (KEFLEX) capsule 500 mg  500 mg Oral Q8H Charlynne Cousins, MD   500 mg at 11/22/20 1357  . collagenase (SANTYL) ointment   Topical Daily Charlynne Cousins, MD   Given at 11/22/20 1440  . doxycycline (VIBRA-TABS) tablet 100 mg  100 mg Oral Q12H Charlynne Cousins, MD   100 mg at 11/22/20 0820  . gabapentin (NEURONTIN) capsule 300 mg  300 mg Oral QHS Truett Mainland, DO   300 mg at 11/21/20 2143  . insulin aspart (novoLOG) injection 0-20 Units  0-20 Units Subcutaneous TID WC Truett Mainland, DO   4 Units at 11/22/20 1702  . insulin aspart (novoLOG) injection 0-5 Units  0-5 Units Subcutaneous QHS Stinson, Jacob J, DO      . insulin glargine (LANTUS) injection 12 Units  12 Units Subcutaneous BID Charlynne Cousins, MD   12 Units at 11/22/20 1702  . mineral oil-hydrophilic petrolatum (AQUAPHOR) ointment   Topical Daily PRN Charlynne Cousins, MD   Given at 11/22/20 1440  . mometasone-formoterol (DULERA) 200-5 MCG/ACT inhaler 2 puff  2 puff Inhalation BID Truett Mainland, DO   2 puff at 11/22/20 0732  . ondansetron (ZOFRAN)  tablet 4 mg  4 mg Oral Q6H PRN Truett Mainland, DO       Or  . ondansetron [facility]) injection 4 mg  4 mg Intravenous Q6H PRN Truett Mainland, DO      . pantoprazole (PROTONIX) EC tablet 40 mg  40 mg Oral Daily Truett Mainland, DO   40 mg at 11/22/20 0820  . potassium chloride SA (KLOR-CON) CR tablet 40 mEq  40 mEq Oral BID Truett Mainland, DO   40 mEq at 11/22/20 0820  . sacubitril-valsartan (ENTRESTO) 24-26 mg per tablet  1 tablet Oral BID Truett Mainland, DO   1 tablet at 11/22/20 0820  . torsemide (DEMADEX) tablet 40 mg  40 mg Oral BID Charlynne Cousins, MD   40 mg at 11/22/20 1701  . traZODone (DESYREL) tablet 150 mg  150 mg Oral QHS Truett Mainland, DO   150 mg at 11/21/20 2143     Discharge Medications: Please see discharge summary for a list of discharge medications.  Relevant Imaging Results:  Relevant Lab Results:   Additional Information [phone]  Nancee Liter  Rachell Cipro, [employer]

## 2020-11-22 NOTE — TOC Initial Note (Signed)
Transition of Care [facility]) - Initial/Assessment Note    Patient Details  Name: Dustin Cruz DOBOSZ MRN: [id number] Date of Birth: 1966-05-13  Transition of Care [facility]) CM/SW Contact:    Trula Ore, [place] Phone Number: 11/22/2020, 5:31 PM  Clinical Narrative:                  CSW received consult for possible SNF placement at time of discharge. CSW spoke with patient regarding PT recommendation of SNF placement at time of discharge.Patient comes from [facility] short term rehab.Patient expressed understanding of PT recommendation and is agreeable to SNF placement at time of discharge. Patient reports preference to return to [facility]. Patient has received the COVID vaccines. Patient expressed being hopeful for rehab and to feel better soon. No further questions reported at this time. CSW to continue to follow and assist with discharge planning needs.       Patient Goals and CMS Choice        Expected Discharge Plan and Services                                                Prior Living Arrangements/Services                       Activities of Daily Living Home Assistive Devices/Equipment: Cane (specify quad or straight) ADL Screening (condition at time of admission) Patient's cognitive ability adequate to safely complete daily activities?: Yes Is the patient deaf or have difficulty hearing?: No Does the patient have difficulty seeing, even when wearing glasses/contacts?: No Does the patient have difficulty concentrating, remembering, or making decisions?: No Patient able to express need for assistance with ADLs?: Yes Does the patient have difficulty dressing or bathing?: No Independently performs ADLs?: No Does the patient have difficulty walking or climbing stairs?: Yes Weakness of Legs: Both Weakness of Arms/Hands: None  Permission Sought/Granted                  Emotional Assessment              Admission diagnosis:  Elevated  troponin [R77.8] Acute on chronic congestive heart failure, unspecified heart failure type [facility]) [I50.9] Patient Active Problem List   Diagnosis Date Noted  . Elevated troponin 11/20/2020  . History of DVT (deep vein thrombosis) 11/20/2020  . COVID 11/05/2020  . Acute on chronic diastolic (congestive) heart failure ([place]) 10/22/2020  . Acute on chronic combined systolic and diastolic CHF (congestive heart failure) ([place]) 09/28/2020  . Peripheral edema 09/27/2020  . Acute on chronic systolic (congestive) heart failure ([place]) 09/27/2020  . Scrotum swelling 09/26/2020  . Acute diastolic CHF (congestive heart failure) ([place]) 07/23/2020  . Abnormal weight gain 07/23/2020  . Noncompliance 07/23/2020  . Pelvic lymphadenopathy 07/20/2020  . Acute on chronic diastolic CHF (congestive heart failure) ([place]) 06/30/2020  . CHF (congestive heart failure) ([place]) 06/29/2020  . Pre-ulcerative calluses 10/29/2019  . Musculoskeletal pain 10/13/2019  . Housing problems 05/23/2019  . Essential hypertension, benign 03/20/2019  . 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 Near syncope 03/17/2019  . AKI (acute kidney injury) ([place]) 03/17/2019  . 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 CKD stage 3 due to type 2 diabetes mellitus ([place]) 11/28/2018  . Microalbuminuria due to type 2 diabetes mellitus ([place]) 05/22/2018  . Hyperglycemia due to diabetes mellitus ([place]) 04/24/2018  .  Abdominal pain, epigastric 11/29/2017  . Diarrhea 11/29/2017  . History of colonic polyps 11/29/2017  . Diffuse abdominal pain 11/29/2017  . Diabetic retinopathy ([place]) 09/21/2015  . Carotid bruit 09/21/2015  . Viral gastroenteritis 06/03/2015  . Hypertriglyceridemia 05/18/2015  . Vitamin D deficiency 12/29/2013  . Nausea alone 04/24/2013  . Frequency of urination 04/14/2013  . Fever, unspecified 04/14/2013  . Hepatic steatosis 12/28/2012  . Peripheral neuropathy 11/25/2012  . Melanoma of skin, site unspecified 11/25/2012  . Mixed hyperlipidemia   .  Depression   . Hypertension associated with diabetes ([place])   . GERD (gastroesophageal reflux disease) 04/03/2012  . Current smoker 03/14/2012  . Type 2 diabetes mellitus with stage 3a chronic kidney disease, with long-term current use of insulin ([place])   . CAD (coronary artery disease) 01/06/2011   PCP:  [name], DO Pharmacy:  No Pharmacies Listed    Social Determinants of Health (SDOH) Interventions    Readmission Risk Interventions Readmission Risk Prevention Plan 10/25/2020 09/29/2020  Transportation Screening Complete Complete  Medication Review Press photographer) Complete Complete  HRI or Home Care Consult Complete Complete  SW Recovery Care/Counseling Consult Complete Complete  Palliative Care Screening Not Applicable Not Applicable  Skilled Nursing Facility Not Applicable Not Complete  Some recent data might be hidden

## 2020-11-22 NOTE — Consult Note (Addendum)
[place] Nurse Consult Note: Patient receiving care in [place] Reason for Consult: BL LE wounds Wound type: #1 Left medial ankle dry crusted eschar  measures 3 cm x 3 cm x 0.3 cm  #2 Left shin pink moist measures 1.6 cm x 1 cm x 0.1 cm #3 Left posterior calf has necrotic eschar with 20% pink, dry and painful, measures 6 cm x 2.2 cm x 0.2 cm  #4 Right heel three calloused fissures  #5 Right shin pink moist measures 2 cm x 1 cm x 0.1 cm. Pressure Injury POA: NA Periwound: Dry scaly warm erythmatous Dressing procedure/placement/frequency: #1 and #3 ankle and calf, apply santyl in a nickel thick layer, cover with saline moistened gauze, dry gauze and ABD pad.  #2 and #5 Shins apply xeroform gauze Kellie Simmering # [phone]), secure with dry 4 x 4.  #4 Clean the right heel with soap and water, rinse and pat dry. Apply a generous amount of Aquafor to the heel. DO NOT put in between the toes.   Once all dressings have been completed, wrap the legs from just below the toes to just below the knees with Kerlix, then wrap with Ace Wrap Kellie Simmering # [phone]). Place both feet in Prevalon boots. Change dressings daily.  Once the ankle and calf wounds are cleaned up, may consider Unna boots.   Monitor the wound area(s) for worsening of condition such as: Signs/symptoms of infection, increase in size, development of or worsening of odor, development of pain, or increased pain at the affected locations.   Notify the medical team if any of these develop.  Thank you for the consult. [place] nurse will not follow at this time.   Please re-consult the [place] team if needed.  Cathlean Marseilles Tamala Julian, MSN, RN, [place], Lysle Pearl, [facility] Wound Treatment Associate Pager [phone]

## 2020-11-22 NOTE — Evaluation (Signed)
Physical Therapy Evaluation Patient Details Name: Dustin Cruz MRN: [id number] DOB: 01-21-1966 Today's Date: 11/22/2020   History of Present Illness  Pt is a 55 y/o male admitted secondary to SOB on exertion. Workup pending. PMH includes CAD, DM, dCHF, COPD, and CKD.  Clinical Impression  Pt admitted secondary to problem above with deficits below. Pt with limited mobility tolerance secondary to pain in LLE. Pt requiring min guard to min A for mobility using RW. Pt from SNF and recommend return at d/c. Will continue to follow acutely.     Follow Up Recommendations SNF;Supervision for mobility/OOB    Equipment Recommendations  Rolling walker with 5" wheels    Recommendations for Other Services       Precautions / Restrictions Precautions Precautions: Fall Restrictions Weight Bearing Restrictions: No      Mobility  Bed Mobility Overal bed mobility: Needs Assistance Bed Mobility: Supine to Sit;Sit to Supine     Supine to sit: Supervision Sit to supine: Supervision   General bed mobility comments: Supervision for safety. Increased time required.    Transfers Overall transfer level: Needs assistance Equipment used: Rolling walker (2 wheeled) Transfers: Sit to/from Stand Sit to Stand: Min assist         General transfer comment: Min A for steadying assist. Cues for hand placement.  Ambulation/Gait Ambulation/Gait assistance: Min guard Gait Distance (Feet): 30 Feet Assistive device: None;Rolling walker (2 wheeled) Gait Pattern/deviations: Step-through pattern;Decreased stride length Gait velocity: decreased.   General Gait Details: Min guard for safety. Distance limited secondary to increased pain in LLE. Cues for sequencing using RW.  Stairs            Wheelchair Mobility    Modified Rankin (Stroke Patients Only)       Balance Overall balance assessment: Needs assistance Sitting-balance support: No upper extremity supported Sitting balance-Leahy  Scale: Fair     Standing balance support: Bilateral upper extremity supported Standing balance-Leahy Scale: Poor Standing balance comment: Reliant on BUE support                             Pertinent Vitals/Pain Pain Assessment: 0-10 Pain Score: 10-Worst pain ever Pain Location: L calf area Pain Descriptors / Indicators: Sore Pain Intervention(s): Limited activity within patient's tolerance;Monitored during session;Repositioned    Home Living Family/patient expects to be discharged to:: Skilled nursing facility                      Prior Function Level of Independence: Needs assistance   Gait / Transfers Assistance Needed: While at SNF, has been using WC mostly. Reports he has been getting up with staff and use of cane.  ADL's / Homemaking Assistance Needed: Requires assist from staff.        Hand Dominance        Extremity/Trunk Assessment   Upper Extremity Assessment Upper Extremity Assessment: Generalized weakness    Lower Extremity Assessment Lower Extremity Assessment: RLE deficits/detail;LLE deficits/detail;Generalized weakness RLE Deficits / Details: Increased swelling and redness LLE Deficits / Details: Increased swelling and redness.    Cervical / Trunk Assessment Cervical / Trunk Assessment: Normal  Communication   Communication: No difficulties  Cognition Arousal/Alertness: Awake/alert Behavior During Therapy: WFL for tasks assessed/performed Overall Cognitive Status: No family/caregiver present to determine baseline cognitive functioning  General Comments      Exercises     Assessment/Plan    PT Assessment Patient needs continued PT services  PT Problem List Decreased strength;Decreased balance;Decreased activity tolerance;Decreased mobility;Pain;Decreased knowledge of precautions;Decreased knowledge of use of DME       PT Treatment Interventions DME  instruction;Therapeutic exercise;Gait training;Balance training;Neuromuscular re-education;Functional mobility training;Therapeutic activities;Patient/family education    PT Goals (Current goals can be found in the Care Plan section)  Acute Rehab PT Goals Patient Stated Goal: to be independent PT Goal Formulation: With patient Time For Goal Achievement: 12/06/20 Potential to Achieve Goals: Good    Frequency Min 2X/week   Barriers to discharge        Co-evaluation               AM-PAC PT "6 Clicks" Mobility  Outcome Measure Help needed turning from your back to your side while in a flat bed without using bedrails?: A Little Help needed moving from lying on your back to sitting on the side of a flat bed without using bedrails?: A Little Help needed moving to and from a bed to a chair (including a wheelchair)?: A Little Help needed standing up from a chair using your arms (e.g., wheelchair or bedside chair)?: A Little Help needed to walk in hospital room?: A Little Help needed climbing 3-5 steps with a railing? : A Lot 6 Click Score: 17    End of Session Equipment Utilized During Treatment: Gait belt Activity Tolerance: Patient limited by pain Patient left: in bed;with call bell/phone within reach;with bed alarm set Nurse Communication: Mobility status PT Visit Diagnosis: Unsteadiness on feet (R26.81);Muscle weakness (generalized) (M62.81)    Time: 0962-8366 PT Time Calculation (min) (ACUTE ONLY): 15 min   Charges:   PT Evaluation $PT Eval Moderate Complexity: 1 Mod          Reuel Derby, PT, DPT  Acute Rehabilitation Services  Pager: [phone] Office: [phone]   Rudean Hitt 11/22/2020, 12:48 PM

## 2020-11-22 NOTE — TOC Progression Note (Addendum)
Transition of Care [facility]) - Progression Note    Patient Details  Name: ADRIENNE DELAY MRN: [id number] Date of Birth: Apr 02, 1966  Transition of Care [facility]) CM/SW [place], [place] Phone Number: 11/22/2020, 5:34 PM  Clinical Narrative:     CSW started insurance authorization for patient. Reference number [id number]. CSW faxed over clinicals for review. CSW spoke with Jackelyn Poling with [facility] who confirmed they can accept patient for SNF placement on Wednesday.  CSW will continue to follow.         Expected Discharge Plan and Services                                                 Social Determinants of Health (SDOH) Interventions    Readmission Risk Interventions Readmission Risk Prevention Plan 10/25/2020 09/29/2020  Transportation Screening Complete Complete  Medication Review Press photographer) Complete Complete  HRI or Home Care Consult Complete Complete  SW Recovery Care/Counseling Consult Complete Complete  Palliative Care Screening Not Applicable Not Applicable  Skilled Nursing Facility Not Applicable Not Complete  Some recent data might be hidden

## 2020-11-23 ENCOUNTER — Encounter (HOSPITAL_COMMUNITY): Payer: Self-pay | Admitting: Family Medicine

## 2020-11-23 DIAGNOSIS — E1165 Type 2 diabetes mellitus with hyperglycemia: Secondary | ICD-10-CM

## 2020-11-23 LAB — BASIC METABOLIC PANEL
Anion gap: 7 (ref 5–15)
BUN: 27 mg/dL — ABNORMAL HIGH (ref 6–20)
CO2: 31 mmol/L (ref 22–32)
Calcium: 8.1 mg/dL — ABNORMAL LOW (ref 8.9–10.3)
Chloride: 93 mmol/L — ABNORMAL LOW (ref 98–111)
Creatinine, Ser: 1.24 mg/dL (ref 0.61–1.24)
GFR, Estimated: >60 mL/min (ref >60)
Glucose, Bld: 257 mg/dL — ABNORMAL HIGH (ref 70–99)
Potassium: 4.3 mmol/L (ref 3.5–5.1)
Sodium: 131 mmol/L — ABNORMAL LOW (ref 135–145)

## 2020-11-23 LAB — PROCALCITONIN: Procalcitonin: <0.1 ng/mL

## 2020-11-23 LAB — GLUCOSE, CAPILLARY
Glucose-Capillary: 386 mg/dL — ABNORMAL HIGH (ref 70–99)
Glucose-Capillary: 182 mg/dL — ABNORMAL HIGH (ref 70–99)
Glucose-Capillary: 160 mg/dL — ABNORMAL HIGH (ref 70–99)
Glucose-Capillary: 275 mg/dL — ABNORMAL HIGH (ref 70–99)

## 2020-11-23 LAB — BRAIN NATRIURETIC PEPTIDE: B Natriuretic Peptide: 4405.9 pg/mL — ABNORMAL HIGH (ref 0.0–100.0)

## 2020-11-23 MED ORDER — INSULIN GLARGINE 100 UNIT/ML ~~LOC~~ SOLN
25.0000 [IU] | Freq: Once | SUBCUTANEOUS | Status: AC
  Administered 2020-11-23: 13 [IU] via SUBCUTANEOUS
  Filled 2020-11-23: qty 0.25

## 2020-11-23 MED ORDER — TORSEMIDE 20 MG PO TABS
60.0000 mg | ORAL_TABLET | Freq: Two times a day (BID) | ORAL | Status: DC
  Administered 2020-11-23 – 2020-11-24 (×2): 60 mg via ORAL
  Filled 2020-11-23 (×2): qty 3

## 2020-11-23 MED ORDER — FUROSEMIDE 10 MG/ML IJ SOLN
80.0000 mg | Freq: Once | INTRAMUSCULAR | Status: AC
  Administered 2020-11-23: 80 mg via INTRAVENOUS
  Filled 2020-11-23: qty 8

## 2020-11-23 MED ORDER — INSULIN ASPART 100 UNIT/ML FLEXPEN: 6.0000 [IU] | PEN_INJECTOR | Freq: Three times a day (TID) | SUBCUTANEOUS | 11 refills | Status: AC

## 2020-11-23 MED ORDER — INSULIN GLARGINE 100 UNIT/ML ~~LOC~~ SOLN: 25.0000 [IU] | Freq: Every day | SUBCUTANEOUS | Status: AC

## 2020-11-23 NOTE — Discharge Summary (Addendum)
Physician Discharge Summary  Dustin Cruz PZW:[id number] DOB: 10/03/1966 DOA: 11/20/2020  PCP: [name], DO  Admit date: 11/20/2020 Discharge date: 11/24/2020   Discharge summary done by Dr. Aileen Fass on February 15, addendum made to after discharge date  Admitted From: SNF Disposition:  SNF  Recommendations for Outpatient Follow-up:  1. Follow up with PCP in 1-2 weeks 2. Please obtain BMP/CBC in one week 3. We need to keep strict I's and O's on him at facility he cannot drink more than 2 L of fluid today.   Home Health:No Equipment/Devices:None  Discharge Condition:Stable CODE STATUS:Full Diet recommendation: Heart Healthy  Brief/Interim Summary: 55 y.o. 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% on echocardiogram on 10/26/2020, history of an MI DVT on anticoagulation recently discharged from the hospital on 11/18/2020 for heart failure exacerbation he was diuresed with 50 pounds on discharge his weight was 108 pounds discharge to skilled nursing facility comes back with shortness of breath and bilateral leg swelling  Discharge Diagnoses:  Active Problems:   CAD (coronary artery disease)   GERD (gastroesophageal reflux disease)   Hyperglycemia due to diabetes mellitus ([place])   CKD stage 3 due to type 2 diabetes mellitus ([place])   Essential hypertension, benign   Acute on chronic combined systolic and diastolic CHF (congestive heart failure) (HCC)   Elevated troponin   History of DVT (deep vein thrombosis)  Dyspnea on exertion and orthopnea secondary to acute systolic heart failure: Patient is a poor historian he cannot be specific on his history. He was monitored on telemetry with no events. His torsemide was held he was started on a high-dose IV Lasix and potassium supplementation he diuresed about half liters. He was switched back to his home dose of torsemide 60 mg  twice a day. We will continue current regimen.  Elevated troponins: He denies any chest pain basically cardiac biomarkers were mildly elevated but remained flat in the setting of chronic renal disease likely demand ischemia.  Possible cellulitis of the lower extremities: He has multiple abrasion he remained afebrile he was restarted back on his doxycycline, wound care was consulted and recommended Santyl cream and wound care dressings per nurse. He will continue doxycycline for total of 10 days.  Essential hypertension with coronary artery disease: Blood pressure continues to be stable continue current regimen.  Chronic kidney disease stage IIIb: With a baseline creatinine 1.2-1.5, on discharge it was 1.28 remained stable.  Uncontrolled diabetes mellitus type 2 with hyperglycemia: He was continue long-acting insulin plus sliding scale his blood sugar hard to control his Lantus was increased units he will continue NovoLog with meals as an outpatient.  Accidental COVID-19 infection: He was initially positive on 11/04/2020 he remained asymptomatic.  History of wide-complex tachycardia and possible atypical flutter: Cardiology was consulted on last admission felt not to be a candidate for AICD due to his noncompliance with his medication continue current regimen Toprol and Eliquis.  Acute confusional state: This resolved with Seroquel at night.   Discharge Instructions  Discharge Instructions    Diet - low sodium heart healthy   Complete by: As directed    Discharge wound care:   Complete by: As directed    Per wound care nurse   Increase activity slowly   Complete by: As directed      Allergies as of 11/24/2020   No Known Allergies     Medication  List    STOP taking these medications   cephALEXin 500 MG capsule Commonly known as: KEFLEX   fluconazole 200 MG tablet Commonly known as: Diflucan     TAKE these medications   acetaminophen 325 MG tablet Commonly known  as: TYLENOL Take 2 tablets (650 mg total) by mouth every 6 (six) hours as needed for mild pain, moderate pain or fever (or Fever >/= 101).   albuterol (2.5 MG/3ML) 0.083% nebulizer solution Commonly known as: PROVENTIL Take 3 mLs (2.5 mg total) by nebulization every 6 (six) hours as needed for wheezing or shortness of breath. Dx---44.1 What changed: Another medication with the same name was changed. Make sure you understand how and when to take each.   albuterol 108 (90 Base) MCG/ACT inhaler Commonly known as: VENTOLIN HFA Inhale 2 puffs into the lungs every 6 (six) hours as needed for wheezing or shortness of breath. INHALE 2 PUFFS BY MOUTH EVERY 6 HOURS AS NEEDED What changed: additional instructions   aspirin EC 81 MG tablet Take 1 tablet (81 mg total) by mouth daily with breakfast.   atorvastatin 80 MG tablet Commonly known as: LIPITOR Take 1 tablet (80 mg total) by mouth daily. What changed: when to take this   busPIRone 7.5 MG tablet Commonly known as: BUSPAR Take 1 tablet by mouth twice daily What changed:   how much to take  how to take this  when to take this  additional instructions   doxycycline 100 MG tablet Commonly known as: VIBRA-TABS Take 1 tablet (100 mg total) by mouth 2 (two) times daily for 7 days.   Dulera 200-5 MCG/ACT Aero Generic drug: mometasone-formoterol Inhale 2 puffs into the lungs in the morning and at bedtime.   Eliquis 5 MG Tabs tablet Generic drug: apixaban Take 1 tablet by mouth twice daily What changed: how much to take   Entresto 24-26 MG Generic drug: sacubitril-valsartan Take 1 tablet by mouth 2 (two) times daily.   Fluticasone-Salmeterol 500-50 MCG/DOSE Aepb Commonly known as: ADVAIR Inhale 1 puff into the lungs 2 (two) times daily.   gabapentin 300 MG capsule Commonly known as: NEURONTIN Take 1 capsule (300 mg total) by mouth at bedtime.   insulin aspart 100 UNIT/ML FlexPen Commonly known as: NOVOLOG Inject 6 Units  into the skin 3 (three) times daily with meals. What changed:   how much to take  when to take this  additional instructions   insulin glargine 100 UNIT/ML injection Commonly known as: LANTUS Inject 0.25 mLs (25 Units total) into the skin at bedtime. What changed:   how much to take  when to take this   insulin lispro 100 UNIT/ML KwikPen Commonly known as: HUMALOG Inject 1-10 Units into the skin See admin instructions. Inject 1-10 units subcutaneously before meals and at bedtime per sliding scale - CBG 151 -200 1 unit, 201-250 2 units, 251-300 4 units, 301-350 6 units, 351-400 8 units, >400 10 units   lisinopril 10 MG tablet Commonly known as: ZESTRIL Take 1 tablet (10 mg total) by mouth at bedtime.   metoprolol succinate 25 MG 24 hr tablet Commonly known as: TOPROL-XL Take 12.5 mg by mouth daily.   nystatin powder Commonly known as: MYCOSTATIN/NYSTOP Apply topically 2 (two) times daily. What changed: how much to take   pantoprazole 40 MG tablet Commonly known as: Protonix Take 1 tablet (40 mg total) by mouth daily.   potassium chloride SA 20 MEQ tablet Commonly known as: KLOR-CON Take 1 tablet (20 mEq total)  by mouth 2 (two) times daily.   Santyl ointment Generic drug: collagenase Apply 1 application topically daily.   torsemide 20 MG tablet Commonly known as: DEMADEX Take 60 mg by mouth 2 (two) times daily.   Torsemide 60 MG Tabs Take 60 mg by mouth 2 (two) times daily.   traZODone 150 MG tablet Commonly known as: DESYREL TAKE 1 TABLET BY MOUTH ONCE DAILY AT BEDTIME     ASK your doctor about these medications   oxyCODONE 5 MG immediate release tablet Commonly known as: Oxy IR/ROXICODONE Take 1 tablet (5 mg total) by mouth 2 (two) times daily as needed for up to 5 days for moderate pain. Ask about: Should I take this medication?            Discharge Care Instructions  (From admission, onward)         Start     Ordered   11/23/20 0000   Discharge wound care:       Comments: Per wound care nurse   11/23/20 0911          Follow-up Information    [name], DO. Go on 11/30/2020.   Specialty: Family Medicine Why: At 4:15pm  Contact information: [place] [address] [phone]        Satira Sark, MD .   Specialty: Cardiology Contact information: [place] [address] [phone]        [place]. Go on 11/29/2020.   Specialty: Cardiology Why: at 1pm with [facility], you will see a provider, pharmacist, and social worker. [facility] will pick you up from [facility] and bring you back after your appointment.  Bring all your medications with you.  Contact information: [address] [phone] [place] [phone]             No Known Allergies  Consultations:  No complaints   Procedures/Studies: DG Chest 1 View  Result Date: 10/26/2020 CLINICAL DATA:  Leg swelling, shortness of breath, negative COVID EXAM: CHEST  1 VIEW COMPARISON:  10/22/2020 FINDINGS: Mild cardiomegaly and pulmonary vascular prominence. No overt airspace opacity. The visualized skeletal structures are unremarkable. IMPRESSION: Mild cardiomegaly and pulmonary vascular prominence. No overt pulmonary edema or other airspace opacity. Electronically Signed   By: Eddie Candle M.D.   On: 10/26/2020 13:12   DG Elbow 2 Views Right  Result Date: 11/17/2020 CLINICAL DATA:  Pain EXAM: RIGHT ELBOW - 2 VIEW COMPARISON:  None. FINDINGS: There is extensive soft tissue swelling about the elbow. There is no definite acute displaced fracture or dislocation. There appears to be a moderate-sized elbow joint effusion. There is no radiographic evidence for osteomyelitis. There is no radiopaque foreign body. IMPRESSION: 1. No acute displaced fracture or dislocation. 2. There is  extensive nonspecific soft tissue swelling about the elbow. Findings could represent cellulitis in the appropriate clinical setting. 3. No radiopaque foreign body. 4. Probable elbow joint effusion. Electronically Signed   By: Constance Holster M.D.   On: 11/17/2020 01:14   DG ELBOW COMPLETE LEFT (3+VIEW)  Result Date: 11/05/2020 CLINICAL DATA:  Elbow pain with swelling history of fall EXAM: LEFT ELBOW - COMPLETE 3+ VIEW COMPARISON:  06/02/2013 FINDINGS: Assessment for radial head fracture is limited due to positioning, there is questionable minimal deformity of the radial head. No dislocation. Generalized soft tissue swelling. IMPRESSION: Questionable minimal deformity of the radial  head, assessment of the radial head is limited by positioning. There is considerable soft tissue swelling. Electronically Signed   By: Donavan Foil M.D.   On: 11/05/2020 18:14   DG Chest Portable 1 View  Result Date: 11/20/2020 CLINICAL DATA:  Dyspnea and weakness EXAM: PORTABLE CHEST 1 VIEW COMPARISON:  10/26/2020 chest radiograph. FINDINGS: Right rotated chest radiograph. Stable cardiomediastinal silhouette with normal heart size. No pneumothorax. No pleural effusion. No overt pulmonary edema. No acute consolidative airspace disease. IMPRESSION: No active disease. Electronically Signed   By: Ilona Sorrel M.D.   On: 11/20/2020 14:00   ECHOCARDIOGRAM COMPLETE  Result Date: 10/26/2020    ECHOCARDIOGRAM REPORT   Patient Name:   LAYMOND POSTLE Date of Exam: 10/26/2020 Medical Rec #:  [id number]    Height:       74.0 in Accession #:    [id number]   Weight:       257.0 lb Date of Birth:  07-09-1966     BSA:          2.418 m Patient Age:    55 years     BP:           137/88 mmHg Patient Gender: M            HR:           50 bpm. Exam Location:  [facility] Procedure: 2D Echo, Cardiac Doppler and Color Doppler Indications:    Abnormal ECG R94.31  History:        Patient has prior history of Echocardiogram examinations, most                  recent 07/24/2020. CHF, CAD, COPD; Risk Factors:Hypertension,                 Diabetes, Dyslipidemia and Current Smoker.  Sonographer:    Alvino Chapel RCS Referring Phys: [id number] Mims D Aspers  1. Left ventricular ejection fraction, by estimation, is 35 to 40%. The left ventricle has moderately decreased function. The left ventricle demonstrates global hypokinesis. There is mild left ventricular hypertrophy. Left ventricular diastolic parameters are indeterminate.  2. Right ventricular systolic function is mildly reduced. The right ventricular size is moderately enlarged. There is moderately elevated pulmonary artery systolic pressure.  3. The mitral valve is normal in structure. Mild mitral valve regurgitation. No evidence of mitral stenosis.  4. The aortic valve is tricuspid. There is mild calcification of the aortic valve. There is mild thickening of the aortic valve. Aortic valve regurgitation is not visualized. No aortic stenosis is present.  5. Moderate pulmonary HTN, PASP is 54 mmHg.  6. The inferior vena cava is dilated in size with <50% respiratory variability, suggesting right atrial pressure of 15 mmHg. FINDINGS  Left Ventricle: Left ventricular ejection fraction, by estimation, is 35 to 40%. The left ventricle has moderately decreased function. The left ventricle demonstrates global hypokinesis. The left ventricular internal cavity size was normal in size. There is mild left ventricular hypertrophy. Left ventricular diastolic parameters are indeterminate. Right Ventricle: The right ventricular size is moderately enlarged. No increase in right ventricular wall thickness. Right ventricular systolic function is mildly reduced. There is moderately elevated pulmonary artery systolic pressure. The tricuspid regurgitant velocity is 3.14 m/s, and with an assumed right atrial pressure of 15 mmHg, the estimated right ventricular systolic pressure is 30.8 mmHg. Left Atrium: Left atrial size was  normal in size. Right Atrium: Right atrial size was normal in size. Pericardium: Trivial  pericardial effusion is present. Mitral Valve: The mitral valve is normal in structure. Mild mitral valve regurgitation. No evidence of mitral valve stenosis. Tricuspid Valve: The tricuspid valve is normal in structure. Tricuspid valve regurgitation is mild . No evidence of tricuspid stenosis. Aortic Valve: The aortic valve is tricuspid. There is mild calcification of the aortic valve. There is mild thickening of the aortic valve. There is mild aortic valve annular calcification. Aortic valve regurgitation is not visualized. No aortic stenosis  is present. Pulmonic Valve: The pulmonic valve was not well visualized. Pulmonic valve regurgitation is not visualized. No evidence of pulmonic stenosis. Aorta: The aortic root is normal in size and structure. Pulmonary Artery: Moderate pulmonary HTN, PASP is 54 mmHg. Venous: The inferior vena cava is dilated in size with less than 50% respiratory variability, suggesting right atrial pressure of 15 mmHg. IAS/Shunts: No atrial level shunt detected by color flow Doppler.  LEFT VENTRICLE PLAX 2D LVIDd:         4.90 cm LVIDs:         4.50 cm LV PW:         1.30 cm LV IVS:        1.10 cm LVOT diam:     1.90 cm LV SV:         46 LV SV Index:   19 LVOT Area:     2.84 cm  LV Volumes (MOD) LV vol d, MOD A2C: 90.9 ml LV vol d, MOD A4C: 93.3 ml LV vol s, MOD A2C: 69.6 ml LV vol s, MOD A4C: 50.7 ml LV SV MOD A2C:     21.3 ml LV SV MOD A4C:     93.3 ml LV SV MOD BP:      32.4 ml RIGHT VENTRICLE TAPSE (M-mode): 1.4 cm LEFT ATRIUM             Index       RIGHT ATRIUM           Index LA diam:        3.00 cm 1.24 cm/m  RA Area:     20.20 cm LA Vol (A2C):   74.2 ml 30.69 ml/m RA Volume:   65.80 ml  27.21 ml/m LA Vol (A4C):   50.6 ml 20.93 ml/m LA Biplane Vol: 62.1 ml 25.68 ml/m  AORTIC VALVE LVOT Vmax:   98.30 cm/s LVOT Vmean:  66.100 cm/s LVOT VTI:    0.164 m  AORTA Ao Root diam: 3.30 cm MITRAL  VALVE                TRICUSPID VALVE MV Area (PHT): 6.17 cm     TR Peak grad:   39.4 mmHg MV Decel Time: 123 msec     TR Vmax:        314.00 cm/s MV E velocity: 138.00 cm/s                             SHUNTS                             Systemic VTI:  0.16 m                             Systemic Diam: 1.90 cm Carlyle Dolly MD Electronically signed by Carlyle Dolly MD Signature Date/Time: 10/26/2020/4:43:29 PM    Final  VAS [place] LOWER EXTREMITY VENOUS (DVT)  Result Date: 11/21/2020  Lower Venous DVT Study Indications: Asymetric edema.  Limitations: Patient positioning. Comparison Study: no prior Performing Technologist: Abram Sander RVS  Examination Guidelines: A complete evaluation includes B-mode imaging, spectral Doppler, color Doppler, and power Doppler as needed of all accessible portions of each vessel. Bilateral testing is considered an integral part of a complete examination. Limited examinations for reoccurring indications may be performed as noted. The reflux portion of the exam is performed with the patient in reverse Trendelenburg.  +---------+---------------+---------+-----------+----------+-------------------+ RIGHT    CompressibilityPhasicitySpontaneityPropertiesThrombus Aging      +---------+---------------+---------+-----------+----------+-------------------+ CFV      Full           Yes      Yes                                      +---------+---------------+---------+-----------+----------+-------------------+ SFJ      Full                                                             +---------+---------------+---------+-----------+----------+-------------------+ FV Prox  Full                                                             +---------+---------------+---------+-----------+----------+-------------------+ FV Mid   Full                                                              +---------+---------------+---------+-----------+----------+-------------------+ FV DistalFull                                                             +---------+---------------+---------+-----------+----------+-------------------+ PFV      Full                                                             +---------+---------------+---------+-----------+----------+-------------------+ POP      Full           Yes      Yes                                      +---------+---------------+---------+-----------+----------+-------------------+ PTV      Full                                                             +---------+---------------+---------+-----------+----------+-------------------+  PERO                                                  Not well visualized +---------+---------------+---------+-----------+----------+-------------------+   +---------+---------------+---------+-----------+----------+--------------+ LEFT     CompressibilityPhasicitySpontaneityPropertiesThrombus Aging +---------+---------------+---------+-----------+----------+--------------+ CFV      Full           Yes      Yes                                 +---------+---------------+---------+-----------+----------+--------------+ SFJ      Full                                                        +---------+---------------+---------+-----------+----------+--------------+ FV Prox  Full                                                        +---------+---------------+---------+-----------+----------+--------------+ FV Mid   Full                                                        +---------+---------------+---------+-----------+----------+--------------+ FV DistalFull                                                        +---------+---------------+---------+-----------+----------+--------------+ PFV      Full                                                         +---------+---------------+---------+-----------+----------+--------------+ POP      Full           Yes      Yes                                 +---------+---------------+---------+-----------+----------+--------------+ PTV      Full                                                        +---------+---------------+---------+-----------+----------+--------------+ PERO     Full                                                        +---------+---------------+---------+-----------+----------+--------------+  Summary: BILATERAL: - No evidence of deep vein thrombosis seen in the lower extremities, bilaterally. - No evidence of superficial venous thrombosis in the lower extremities, bilaterally. -No evidence of popliteal cyst, bilaterally.   *See table(s) above for measurements and observations. Electronically signed by Harold Barban MD on 11/21/2020 at 7:18:33 PM.    Final    (Echo, Carotid, EGD, Colonoscopy, ERCP)    Subjective: No new complaints  Discharge Exam: Vitals:   11/24/20 0346 11/24/20 0753  BP: 119/79   Pulse: 95 92  Resp: 19 18  Temp: 98.4 F (36.9 C)   SpO2:  99%   Vitals:   11/23/20 2038 11/24/20 0346 11/24/20 0441 11/24/20 0753  BP: 121/89 119/79    Pulse: 76 95  92  Resp: 17 19  18   Temp:  98.4 F (36.9 C)    TempSrc:  Oral    SpO2: 99%   99%  Weight:   92 kg   Height:        General: Pt is alert, awake, not in acute distress Cardiovascular: RRR, S1/S2 +, no rubs, no gallops Respiratory: CTA bilaterally, no wheezing, no rhonchi Abdominal: Soft, NT, ND, bowel sounds + Extremities: no edema, no cyanosis    The results of significant diagnostics from this hospitalization (including imaging, microbiology, ancillary and laboratory) are listed below for reference.     Microbiology: Recent Results (from the past 240 hour(s))  SARS CORONAVIRUS 2 (TAT 6-24 HRS) Nasopharyngeal Nasopharyngeal Swab     Status: Abnormal   Collection Time:  11/14/20  1:26 PM   Specimen: Nasopharyngeal Swab  Result Value Ref Range Status   SARS Coronavirus 2 POSITIVE (A) NEGATIVE Final    Comment: (NOTE) SARS-CoV-2 target nucleic acids are DETECTED.  The SARS-CoV-2 RNA is generally detectable in upper and lower respiratory specimens during the acute phase of infection. Positive results are indicative of the presence of SARS-CoV-2 RNA. Clinical correlation with patient history and other diagnostic information is  necessary to determine patient infection status. Positive results do not rule out bacterial infection or co-infection with other viruses.  The expected result is Negative.  Fact Sheet for Patients: SugarRoll.be  Fact Sheet for Healthcare Providers: https://www.woods-mathews.com/  This test is not yet approved or cleared by the [place] FDA and  has been authorized for detection and/or diagnosis of SARS-CoV-2 by FDA under an Emergency Use Authorization (EUA). This EUA will remain  in effect (meaning this test can be used) for the duration of the COVID-19 declaration under Section 564(b)(1) of the Act, 21 U. S.C. section 360bbb-3(b)(1), unless the authorization is terminated or revoked sooner.   Performed at [facility], [place] [address], [place], [place] [address]   Culture, blood (Routine X 2) w Reflex to ID Panel     Status: None (Preliminary result)   Collection Time: 11/21/20  7:51 AM   Specimen: BLOOD  Result Value Ref Range Status   Specimen Description BLOOD LEFT ANTECUBITAL  Final   Special Requests   Final    BOTTLES DRAWN AEROBIC AND ANAEROBIC Blood Culture results may not be optimal due to an inadequate volume of blood received in culture bottles   Culture   Final    NO GROWTH 3 DAYS Performed at [facility], [place] [address], [place], [place] [address]    Report Status PENDING  Incomplete  Culture, blood (Routine X 2) w Reflex to ID Panel     Status:  None (Preliminary result)  Collection Time: 11/21/20  7:57 AM   Specimen: BLOOD LEFT HAND  Result Value Ref Range Status   Specimen Description BLOOD LEFT HAND  Final   Special Requests   Final    BOTTLES DRAWN AEROBIC ONLY Blood Culture results may not be optimal due to an inadequate volume of blood received in culture bottles   Culture   Final    NO GROWTH 3 DAYS Performed at [facility], [place] [address] [place], [place] [address]    Report Status PENDING  Incomplete     Labs: BNP (last 3 results) Recent Labs    10/22/20 0910 11/20/20 1253 11/23/20 0131  BNP 1,662.0* >4,500.0* 2,878.6*   Basic Metabolic Panel: Recent Labs  Lab 11/20/20 1253 11/21/20 0227 11/22/20 0721 11/23/20 0131 11/24/20 0220  NA 130* 136 132* 131* 127*  K 4.0 4.0 4.0 4.3 4.4  CL 91* 94* 92* 93* 90*  CO2 31 32 31 31 28   GLUCOSE 438* 93 257* 257* 369*  BUN 34* 28* 25* 27* 36*  CREATININE 1.43* 1.27* 1.31* 1.24 1.22  CALCIUM 8.3* 8.7* 8.6* 8.1* 8.0*   Liver Function Tests: Recent Labs  Lab 11/18/20 0758 11/20/20 1253  AST 59* 44*  ALT 101* 77*  ALKPHOS 198* 175*  BILITOT 1.0 1.5*  PROT 6.7 6.8  ALBUMIN 2.2* 2.2*   Recent Labs  Lab 11/20/20 1253  LIPASE 19   No results for input(s): AMMONIA in the last 168 hours. CBC: Recent Labs  Lab 11/18/20 0758 11/20/20 1253 11/21/20 0227  WBC 10.8* 8.9 6.9  NEUTROABS  --  6.8  --   HGB 9.8* 9.3* 8.8*  HCT 30.9* 29.5* 28.1*  MCV 92.2 91.3 88.9  PLT 529* 455* 469*   Cardiac Enzymes: No results for input(s): CKTOTAL, CKMB, CKMBINDEX, TROPONINI in the last 168 hours. BNP: Invalid input(s): POCBNP CBG: Recent Labs  Lab 11/23/20 1101 11/23/20 1604 11/23/20 2133 11/24/20 0727 11/24/20 1219  GLUCAP 182* 160* 275* 354* 121*   D-Dimer No results for input(s): DDIMER in the last 72 hours. Hgb A1c No results for input(s): HGBA1C in the last 72 hours. Lipid Profile No results for input(s): CHOL, HDL, LDLCALC, TRIG, CHOLHDL,  LDLDIRECT in the last 72 hours. Thyroid function studies No results for input(s): TSH, T4TOTAL, T3FREE, THYROIDAB in the last 72 hours.  Invalid input(s): FREET3 Anemia work up No results for input(s): VITAMINB12, FOLATE, FERRITIN, TIBC, IRON, RETICCTPCT in the last 72 hours. Urinalysis    Component Value Date/Time   COLORURINE YELLOW 09/15/2020 2055   APPEARANCEUR CLEAR 09/15/2020 2055   APPEARANCEUR Cloudy (A) 06/10/2019 1549   LABSPEC 1.024 09/15/2020 2055   PHURINE 5.0 09/15/2020 2055   GLUCOSEU >=500 (A) 09/15/2020 2055   HGBUR MODERATE (A) 09/15/2020 2055   BILIRUBINUR NEGATIVE 09/15/2020 2055   BILIRUBINUR Negative 06/10/2019 1549   KETONESUR NEGATIVE 09/15/2020 2055   PROTEINUR >=300 (A) 09/15/2020 2055   UROBILINOGEN 0.2 01/26/2014 1618   NITRITE NEGATIVE 09/15/2020 2055   LEUKOCYTESUR NEGATIVE 09/15/2020 2055   Sepsis Labs Invalid input(s): PROCALCITONIN,  WBC,  LACTICIDVEN Microbiology Recent Results (from the past 240 hour(s))  SARS CORONAVIRUS 2 (TAT 6-24 HRS) Nasopharyngeal Nasopharyngeal Swab     Status: Abnormal   Collection Time: 11/14/20  1:26 PM   Specimen: Nasopharyngeal Swab  Result Value Ref Range Status   SARS Coronavirus 2 POSITIVE (A) NEGATIVE Final    Comment: (NOTE) SARS-CoV-2 target nucleic acids are DETECTED.  The SARS-CoV-2 RNA is generally detectable in upper  and lower respiratory specimens during the acute phase of infection. Positive results are indicative of the presence of SARS-CoV-2 RNA. Clinical correlation with patient history and other diagnostic information is  necessary to determine patient infection status. Positive results do not rule out bacterial infection or co-infection with other viruses.  The expected result is Negative.  Fact Sheet for Patients: SugarRoll.be  Fact Sheet for Healthcare Providers: https://www.woods-mathews.com/  This test is not yet approved or cleared by the  [place] FDA and  has been authorized for detection and/or diagnosis of SARS-CoV-2 by FDA under an Emergency Use Authorization (EUA). This EUA will remain  in effect (meaning this test can be used) for the duration of the COVID-19 declaration under Section 564(b)(1) of the Act, 21 U. S.C. section 360bbb-3(b)(1), unless the authorization is terminated or revoked sooner.   Performed at [facility], [place] [address] [place], [place] [address]   Culture, blood (Routine X 2) w Reflex to ID Panel     Status: None (Preliminary result)   Collection Time: 11/21/20  7:51 AM   Specimen: BLOOD  Result Value Ref Range Status   Specimen Description BLOOD LEFT ANTECUBITAL  Final   Special Requests   Final    BOTTLES DRAWN AEROBIC AND ANAEROBIC Blood Culture results may not be optimal due to an inadequate volume of blood received in culture bottles   Culture   Final    NO GROWTH 3 DAYS Performed at [facility], [place] [address], [place], [place] [address]    Report Status PENDING  Incomplete  Culture, blood (Routine X 2) w Reflex to ID Panel     Status: None (Preliminary result)   Collection Time: 11/21/20  7:57 AM   Specimen: BLOOD LEFT HAND  Result Value Ref Range Status   Specimen Description BLOOD LEFT HAND  Final   Special Requests   Final    BOTTLES DRAWN AEROBIC ONLY Blood Culture results may not be optimal due to an inadequate volume of blood received in culture bottles   Culture   Final    NO GROWTH 3 DAYS Performed at [facility], [place] [address] [place], [place] [address]    Report Status PENDING  Incomplete     Time coordinating discharge: Over 30 minutes  SIGNED:   Florencia Reasons, MD  [facility] 11/24/2020, 12:56 PM Pager   If 7PM-7AM, please contact night-coverage [email or web address] Password [email or web address]

## 2020-11-23 NOTE — TOC Progression Note (Addendum)
Transition of Care [facility]) - Progression Note    Patient Details  Name: Dustin Cruz MRN: [id number] Date of Birth: April 20, 1966  Transition of Care [facility]) CM/SW [place], [place] Phone Number: 11/23/2020, 1:29 PM  Clinical Narrative:     Update 2/15- CSW followed up with Jackelyn Poling at [facility] and she said that as of right now she has no current SNF bed available for patient due to residents testing positive for covid. She wants CSW to follow up with her in the morning after she has has her morning meeting to see what plan she can come up with for patient. CSW spoke with patient and let patient know that there is no current SNF bed available. CSW explained to patient that SNF said they can take him back, but are waiting for a bed to come available. CSW let patient know she will follow back up with patient int he morning to let him know what Debbie with [employer]. CSW asked patient if she can fax him out to other facility's as a back up plan. Patient agreed for CSW to fax him out near [place] and [place] area. CSW will follow back up with patient tomorrow with SNF bed offers.  CSW will continue to follow.    CSW received insurance authorization approval for patient. 2/15-2/17 Navi health reference number is #[id number]. Next review date is 2/17.  Patient has SN[facility] bed at [facility]. Insurance authorization has been approved.  CSW will continue to follow.    Expected Discharge Plan and Services           Expected Discharge Date: 11/23/20                                     Social Determinants of Health (SDOH) Interventions Food Insecurity Interventions: Other (Comment) (pt going to SNF upon DC.) Financial Strain Interventions: Other (Comment) (Inpt SW/CM made aware. pt going to SNF upon DC from PT rec) Housing Interventions: Other (Comment) (Pt going to SNF upon DC.) Transportation Interventions: [employer] Alcohol Brief Interventions/Follow-up:  AUDIT Score <7 follow-up not indicated (pt states he hasn't had a drink for several years)  Readmission Risk Interventions Readmission Risk Prevention Plan 10/25/2020 09/29/2020  Transportation Screening Complete Complete  Medication Review Press photographer) Complete Complete  HRI or Home Care Consult Complete Complete  SW Recovery Care/Counseling Consult Complete Complete  Palliative Care Screening Not Applicable Not Applicable  Skilled Nursing Facility Not Applicable Not Complete  Some recent data might be hidden
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
## 2020-11-24 DIAGNOSIS — I509 Heart failure, unspecified: Secondary | ICD-10-CM

## 2020-11-24 LAB — BASIC METABOLIC PANEL
Anion gap: 9 (ref 5–15)
BUN: 36 mg/dL — ABNORMAL HIGH (ref 6–20)
CO2: 28 mmol/L (ref 22–32)
Calcium: 8 mg/dL — ABNORMAL LOW (ref 8.9–10.3)
Chloride: 90 mmol/L — ABNORMAL LOW (ref 98–111)
Creatinine, Ser: 1.22 mg/dL (ref 0.61–1.24)
GFR, Estimated: >60 mL/min (ref >60)
Glucose, Bld: 369 mg/dL — ABNORMAL HIGH (ref 70–99)
Potassium: 4.4 mmol/L (ref 3.5–5.1)
Sodium: 127 mmol/L — ABNORMAL LOW (ref 135–145)

## 2020-11-24 LAB — GLUCOSE, CAPILLARY
Glucose-Capillary: 354 mg/dL — ABNORMAL HIGH (ref 70–99)
Glucose-Capillary: 121 mg/dL — ABNORMAL HIGH (ref 70–99)
Glucose-Capillary: 193 mg/dL — ABNORMAL HIGH (ref 70–99)

## 2020-11-24 MED ORDER — ACETAMINOPHEN 325 MG PO TABS: 650.0000 mg | ORAL_TABLET | Freq: Four times a day (QID) | ORAL | Status: DC | PRN

## 2020-11-24 MED ORDER — OXYCODONE HCL 5 MG PO TABS
5.0000 mg | ORAL_TABLET | Freq: Once | ORAL | Status: AC | PRN
  Administered 2020-11-24: 5 mg via ORAL
  Filled 2020-11-24: qty 1

## 2020-11-24 NOTE — Progress Notes (Signed)
Patient was discharged yesterday , however did not leave the hospital for unclear reason.   Uneventful night, vital signs are stable patient seen and examined today, he is sitting up at edge of the bed talking on the phone when I entered the room, he denies pain, no edema, he is on room air.  He is stable to discharge, details please see discharge summary done by Dr. Aileen Fass on November 23, 2020.  Diet recommendation heart healthy carb /modified diet

## 2020-11-24 NOTE — TOC Progression Note (Signed)
Transition of Care [facility]) - Progression Note    Patient Details  Name: Dustin Cruz MRN: [id number] Date of Birth: 08-08-1966  Transition of Care [facility]) CM/SW [place], [place] Phone Number: 11/24/2020, 11:15 AM  Clinical Narrative:      CSW spoke with patient and provided SNF bed offers. Patient has chosen SN[facility] placement at [facility]. CSW called patients significant other [name] to update her. CSW called patients insurance to update facility choice. Patients insurance has been approved. [place] ID # is [id number] and insurance approval number is [id number]. Insurance has been approved from 2/16-2/18. Next review date is 2/18. CSW informed MD.  Patient has SNF bed at [facility]. Insurance authorization has been approved.  CSW will continue to follow.        Expected Discharge Plan and Services           Expected Discharge Date: 11/23/20                                     Social Determinants of Health (SDOH) Interventions Food Insecurity Interventions: Other (Comment) (pt going to SNF upon DC.) Financial Strain Interventions: Other (Comment) (Inpt SW/CM made aware. pt going to SNF upon DC from PT rec) Housing Interventions: Other (Comment) (Pt going to SNF upon DC.) Transportation Interventions: [employer] Alcohol Brief Interventions/Follow-up: AUDIT Score <7 follow-up not indicated (pt states he hasn't had a drink for several years)  Readmission Risk Interventions Readmission Risk Prevention Plan 10/25/2020 09/29/2020  Transportation Screening Complete Complete  Medication Review Press photographer) Complete Complete  HRI or Home Care Consult Complete Complete  SW Recovery Care/Counseling Consult Complete Complete  Palliative Care Screening Not Applicable Not Applicable  Skilled Nursing Facility Not Applicable Not Complete  Some recent data might be hidden

## 2020-11-24 NOTE — Care Management Important Message (Signed)
Important Message  Patient Details  Name: ROOSEVELT EIMERS MRN: [id number] Date of Birth: 1965/11/16   Medicare Important Message Given:  Yes     Shelda Altes 11/24/2020, 10:21 AM

## 2020-11-24 NOTE — Progress Notes (Signed)
Inpatient Diabetes Program Recommendations  AACE/ADA: New Consensus Statement on Inpatient Glycemic Control (2015)  Target Ranges:  Prepandial:   less than 140 mg/dL      Peak postprandial:   less than 180 mg/dL (1-2 hours)      Critically ill patients:  140 - 180 mg/dL   Lab Results  Component Value Date   GLUCAP 354 (H) 11/24/2020   HGBA1C 14.0 (H) 09/27/2020    Review of Glycemic Control Results for SEARS, ORAN (MRN [id number]) as of 11/24/2020 10:42  Ref. Range 11/23/2020 07:53 11/23/2020 11:01 11/23/2020 16:04 11/23/2020 21:33 11/24/2020 07:27  Glucose-Capillary Latest Ref Range: 70 - 99 mg/dL 386 (H) 182 (H) 160 (H) 275 (H) 354 (H)   Diabetes history: DM 2 Outpatient Diabetes medications: Lantus 12 units qevening, Humalog 0-10 units tid Current orders for Inpatient glycemic control:  Novolog 0-20 units tid + hs scale   Inpatient Diabetes Program Recommendations:    Pt on basal insulin at home. Pt received a total of Lantus 25 units yesterday. A1c 14%. Basal insulin currently not ordered.  - Add Lantus to 30 units.  Thanks, Tama Headings RN, MSN, BC-ADM Inpatient Diabetes Coordinator Team Pager [phone] (8a-5p)

## 2020-11-24 NOTE — TOC Transition Note (Signed)
Transition of Care [facility]) - CM/SW Discharge Note   Patient Details  Name: Dustin Cruz MRN: [id number] Date of Birth: [birth year]  Transition of Care [facility]) CM/SW Contact:  Trula Ore, [place] Phone Number: 11/24/2020, 11:48 AM   Clinical Narrative:     Patient will DC to: [facility]  Anticipated DC date: 11/24/2020  Family notified: Armed forces operational officer by: [name]  ?  Per MD patient ready for DC to [facility]. RN, patient, patient's family, and facility notified of DC. Discharge Summary sent to facility. RN given number for report tele#[phone] RM#[phone]. DC packet on chart. Ambulance transport requested for patient.  CSW signing off.    Final next level of care: Skilled Nursing Facility Barriers to Discharge: No Barriers Identified   Patient Goals and CMS Choice Patient states their goals for this hospitalization and ongoing recovery are:: to go to SNF CMS Medicare.gov Compare Post Acute Care list provided to:: Patient Choice offered to / list presented to : Patient  Discharge Placement              Patient chooses bed at: [facility] Patient to be transferred to facility by: [facility] Name of family member notified: [name] Patient and family notified of of transfer: 11/24/20  Discharge Plan and Services                                     Social Determinants of Health (SDOH) Interventions Food Insecurity Interventions: Other (Comment) (pt going to SNF upon DC.) Financial Strain Interventions: Other (Comment) (Inpt SW/CM made aware. pt going to SNF upon DC from PT rec) Housing Interventions: Other (Comment) (Pt going to SNF upon DC.) Transportation Interventions: [employer] Alcohol Brief Interventions/Follow-up: AUDIT Score <7 follow-up not indicated (pt states he hasn't had a drink for several years)   Readmission Risk Interventions Readmission Risk Prevention Plan 10/25/2020 09/29/2020  Transportation Screening  Complete Complete  Medication Review Press photographer) Complete Complete  HRI or Home Care Consult Complete Complete  SW Recovery Care/Counseling Consult Complete Complete  Palliative Care Screening Not Applicable Not Applicable  Skilled Nursing Facility Not Applicable Not Complete  Some recent data might be hidden

## 2020-11-26 LAB — CULTURE, BLOOD (ROUTINE X 2)
Culture: NO GROWTH
Culture: NO GROWTH

## 2020-11-29 ENCOUNTER — Encounter (HOSPITAL_COMMUNITY): Payer: 59

## 2020-11-29 ENCOUNTER — Telehealth (HOSPITAL_COMMUNITY): Payer: Self-pay
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
## 2020-11-30 ENCOUNTER — Ambulatory Visit: Payer: 59 | Admitting: Family Medicine

## 2020-12-02 ENCOUNTER — Ambulatory Visit: Payer: 59 | Admitting: Student

## 2020-12-02 NOTE — Progress Notes (Deleted)
Cardiology Office Note  Date: 12/02/2020   ID: Dustin Cruz, DOB 1966/09/13, MRN [id number]  PCP:  [name], DO  Cardiologist:  Rozann Lesches, MD Electrophysiologist:  None   Chief Complaint: Hospital follow up  History of Present Illness: Dustin Cruz is a 55 y.o. 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 History of MI, DVT, HF exacerbations.  Previously seen by Bernerd Pho, PA-C July 23, 2020 for worsening edema and weight gain.  He reported worsening dyspnea on exertion, orthopnea and lower extremity edema for the prior several weeks.  Stated he was compliant with Lasix 40 mg daily and increased this to 60 mg daily earlier in the week.  Reported associated abdominal distention and scrotal edema.  He was admitted for diuresis.  There were issues with medication noncompliance.  Recent admission to [facility] complaining of worsening lower extremity edema and dyspnea with exertion after multiple ED presentations for similar complaints..  Found to have an elevated BNP with abnormal creatinine.  His ACE inhibitor was held on admission.  He received IV diuresis.  EKG showed sinus bradycardia, sinus rhythm, frequent PVCs.  Started on Toprol.  PVCs improved but exhibited intermittent variable conduction/WenkeBach on telemetry.  He was started on Entresto of 24/26 mg.  He was discharged on torsemide 40 mg daily.  Previously echo at [facility] on 07/24/2020 showed EF of 60 to 65%.   Past Medical History:  Diagnosis Date  . Anxiety   . Asthma   . CHF (congestive heart failure) ([place])   . CKD stage 3 due to type 2 diabetes mellitus ([place])   . COPD (chronic obstructive pulmonary disease) ([place])   . Coronary artery disease    s/p BMS to Ramus 10/2010;  Cath 01/22/12 prox 30-40% LAD, LCx ramus w/ hazy 70-80% in-stent restenosis, EF 60% treated medically   . Coronary artery disease    s/p BMS to Ramus 10/2010;  Cath 01/22/12 prox 30-40% LAD, LCx ramus w/ hazy 70-80% in-stent restenosis, EF 60% treated medically   . Depression   . Diabetes mellitus    Type 2  . Gastroparesis 05/2013  . GERD (gastroesophageal reflux disease)   . HTN (hypertension)   . Hyperlipidemia   . Hyperlipidemia   . Major depression, chronic 08/13/2012  . Melanoma ([place]) 2007   surgery at [facility], Followed by [place]  . Myocardial infarction ([place]) 2012  . Obesity   . Tobacco abuse   . Tubular adenoma   . Urinary retention     Past Surgical History:  Procedure Laterality Date  . BIOPSY  01/07/2018   Procedure: BIOPSY;  Surgeon: Daneil Dolin, MD;  [facility];  Service: Endoscopy;;  duodenal and gastric biopsy  . BIOPSY  04/25/2018   Procedure: BIOPSY;  Surgeon: Daneil Dolin, MD;  [facility]: [facility];  Service: Endoscopy;;  ascending and descending and sigmoid biopsies  . CARDIAC CATHETERIZATION     with stent  . COLONOSCOPY N/A 01/27/2013   OIN:OMVEHM and colonic polyps. Tubular adenomas, poor bowel prep, one-year follow-up surveillance colonoscopy recommended  . COLONOSCOPY WITH PROPOFOL N/A 04/25/2018   Procedure: COLONOSCOPY WITH PROPOFOL;  Surgeon: Daneil Dolin, MD;  Location: [facility];  Service: Endoscopy;  Laterality: N/A;  11:00am - pt to be prepped INPT and labs to be done as well  . CORONARY ANGIOPLASTY WITH  STENT PLACEMENT    . ESOPHAGOGASTRODUODENOSCOPY  04/24/2012   [place]-mild erosive reflux esophagitis,dilated w/74F Venia Minks, small HH, minimal chronic gastric/bulbar erosions(No H pylori)  . ESOPHAGOGASTRODUODENOSCOPY (EGD) WITH PROPOFOL N/A 01/07/2018   Dr. Gala Romney: Erythematous mucosa in the stomach, retained gastric contents, incomplete exam.  Biopsied showed mild chronic gastritis but no H. pylori.  Duodenal biopsy was negative for celiac disease.  [name] FLEXIBLE SIGMOIDOSCOPY N/A 01/07/2018   Dr. Gala Romney: Incomplete colonoscopy due to  inadequate bowel prep  . LEFT HEART CATHETERIZATION WITH CORONARY ANGIOGRAM N/A 01/22/2012   Procedure: LEFT HEART CATHETERIZATION WITH CORONARY ANGIOGRAM;  Surgeon: Jolaine Artist, MD;  Location: [facility] CATH LAB;  Service: Cardiovascular;  Laterality: N/A;  . melanoma surgery  2007   [place], removed lymph nodes under arm as well,Left abd    Current Outpatient Medications  Medication Sig Dispense Refill  . acetaminophen (TYLENOL) 325 MG tablet Take 2 tablets (650 mg total) by mouth every 6 (six) hours as needed for mild pain, moderate pain or fever (or Fever >/= 101). 30 tablet 2  . albuterol (PROVENTIL) (2.5 MG/3ML) 0.083% nebulizer solution Take 3 mLs (2.5 mg total) by nebulization every 6 (six) hours as needed for wheezing or shortness of breath. Dx---44.1 540 mL 0  . albuterol (VENTOLIN HFA) 108 (90 Base) MCG/ACT inhaler Inhale 2 puffs into the lungs every 6 (six) hours as needed for wheezing or shortness of breath. INHALE 2 PUFFS BY MOUTH EVERY 6 HOURS AS NEEDED (Patient taking differently: Inhale 2 puffs into the lungs every 6 (six) hours as needed for wheezing or shortness of breath.) 18 g 1  . aspirin EC 81 MG tablet Take 1 tablet (81 mg total) by mouth daily with breakfast. 30 tablet 5  . atorvastatin (LIPITOR) 80 MG tablet Take 1 tablet (80 mg total) by mouth daily. (Patient taking differently: Take 80 mg by mouth at bedtime.) 90 tablet 2  . busPIRone (BUSPAR) 7.5 MG tablet Take 1 tablet by mouth twice daily (Patient taking differently: Take 7.5 mg by mouth 2 (two) times daily.) 60 tablet 0  . collagenase (SANTYL) ointment Apply 1 application topically daily. 15 g 0  . ELIQUIS 5 MG TABS tablet Take 1 tablet by mouth twice daily (Patient taking differently: Take 5 mg by mouth 2 (two) times daily.) 60 tablet 4  . ENTRESTO 24-26 MG Take 1 tablet by mouth 2 (two) times daily. 60 tablet 3  . Fluticasone-Salmeterol (ADVAIR) 500-50 MCG/DOSE AEPB Inhale 1 puff into the lungs 2 (two) times daily.     [name] gabapentin (NEURONTIN) 300 MG capsule Take 1 capsule (300 mg total) by mouth at bedtime. 30 capsule 2  . insulin aspart (NOVOLOG) 100 UNIT/ML FlexPen Inject 6 Units into the skin 3 (three) times daily with meals. 15 mL 11  . insulin glargine (LANTUS) 100 UNIT/ML injection Inject 0.25 mLs (25 Units total) into the skin at bedtime.    . insulin lispro (HUMALOG) 100 UNIT/ML KwikPen Inject 1-10 Units into the skin See admin instructions. Inject 1-10 units subcutaneously before meals and at bedtime per sliding scale - CBG 151 -200 1 unit, 201-250 2 units, 251-300 4 units, 301-350 6 units, 351-400 8 units, >400 10 units    . lisinopril (ZESTRIL) 10 MG tablet Take 1 tablet (10 mg total) by mouth at bedtime. 90 tablet 1  . metoprolol succinate (TOPROL-XL) 25 MG 24 hr tablet Take 12.5 mg by mouth daily.    . mometasone-formoterol (DULERA) 200-5 MCG/ACT AERO Inhale 2 puffs into  the lungs in the morning and at bedtime. (Patient not taking: No sig reported) 13 g 12  . nystatin (MYCOSTATIN/NYSTOP) powder Apply topically 2 (two) times daily. (Patient taking differently: Apply 1 application topically 2 (two) times daily.) 15 g 1  . pantoprazole (PROTONIX) 40 MG tablet Take 1 tablet (40 mg total) by mouth daily. 30 tablet 1  . potassium chloride SA (KLOR-CON) 20 MEQ tablet Take 1 tablet (20 mEq total) by mouth 2 (two) times daily. 60 tablet 2  . torsemide (DEMADEX) 20 MG tablet Take 60 mg by mouth 2 (two) times daily.    [name] torsemide 60 MG TABS Take 60 mg by mouth 2 (two) times daily. (Patient not taking: No sig reported) 60 tablet 3  . traZODone (DESYREL) 150 MG tablet TAKE 1 TABLET BY MOUTH ONCE DAILY AT BEDTIME (Patient taking differently: Take 150 mg by mouth at bedtime.) 90 tablet 0   No current facility-administered medications for this visit.   Allergies:  Patient has no known allergies.   Social History: The patient  reports that he has been smoking cigarettes. He started smoking about 39 years ago. He  has a 15.50 Ging-year smoking history. He has never used smokeless tobacco. He reports that he does not drink alcohol and does not use drugs.   Family History: The patient's family history includes Alcohol abuse in his father; Heart disease in an other family member; Lung cancer in an other family member; Other in an other family member; Stroke in his mother.   ROS:  Please see the history of present illness. Otherwise, complete review of systems is positive for {NONE DEFAULTED:18576::"none"}.  All other systems are reviewed and negative.   Physical Exam: VS:  There were no vitals taken for this visit., BMI There is no height or weight on file to calculate BMI.  Wt Readings from Last 3 Encounters:  11/24/20 202 lb 12.8 oz (92 kg)  11/17/20 208 lb 12.4 oz (94.7 kg)  10/05/20 214 lb (97.1 kg)    General: Patient appears comfortable at rest. HEENT: Conjunctiva and lids normal, oropharynx clear with moist mucosa. Neck: Supple, no elevated JVP or carotid bruits, no thyromegaly. Lungs: Clear to auscultation, nonlabored breathing at rest. Cardiac: Regular rate and rhythm, no S3 or significant systolic murmur, no pericardial rub. Abdomen: Soft, nontender, no hepatomegaly, bowel sounds present, no guarding or rebound. Extremities: No pitting edema, distal pulses 2+. Skin: Warm and dry. Musculoskeletal: No kyphosis. Neuropsychiatric: Alert and oriented x3, affect grossly appropriate.  ECG:  {EKG/Telemetry Strips [phone]}  Recent Labwork: 07/23/2020: TSH 1.008 11/02/2020: Magnesium 1.7 11/20/2020: ALT 77; AST 44 11/21/2020: Hemoglobin 8.8; Platelets 469 11/23/2020: B Natriuretic Peptide 4,405.9 11/24/2020: BUN 36; Creatinine, Ser 1.22; Potassium 4.4; Sodium 127     Component Value Date/Time   CHOL 236 (H) 07/24/2020 0413   CHOL 166 01/15/2017 1036   CHOL 277 (H) 03/19/2013 1131   TRIG 67 07/24/2020 0413   TRIG 292 (H) 03/19/2013 1131   HDL 80 07/24/2020 0413   HDL 39 (L)  01/15/2017 1036   HDL 44 03/19/2013 1131   CHOLHDL 3.0 07/24/2020 0413   VLDL 13 07/24/2020 0413   LDLCALC 143 (H) 07/24/2020 0413   LDLCALC 52 01/15/2017 1036   LDLCALC 175 (H) 03/19/2013 1131    Other Studies Reviewed Today:  Last TTE (07/28/20): EF 20-25%. LV moderately dilated. Severe global LVH. LA mild to moderately dilated. Trace to mild AI. Mild to moderate MR. Mild TR.   Assessment and Plan:  No diagnosis found.   Medication Adjustments/Labs and Tests Ordered: Current medicines are reviewed at length with the patient today.  Concerns regarding medicines are outlined above.   Disposition: Follow-up with ***  Signed, Levell July, NP 12/02/2020 8:00 PM    [place] [facility] [address], [place], [place] [address] Phone: [phone]; Fax: [phone]

## 2020-12-03 ENCOUNTER — Ambulatory Visit: Payer: 59 | Admitting: Family Medicine

## 2020-12-07 ENCOUNTER — Encounter: Payer: Self-pay | Admitting: Family Medicine

## 2021-10-26 ENCOUNTER — Telehealth: Payer: Self-pay | Admitting: Family Medicine

## 2021-10-26 NOTE — Telephone Encounter (Signed)
Appointment scheduled on 11/03/21 with Monia Pouch.

## 2021-11-03 ENCOUNTER — Ambulatory Visit: Payer: 59 | Admitting: Family Medicine

## 2021-11-03 ENCOUNTER — Encounter: Payer: Self-pay | Admitting: Family Medicine

## 2022-04-12 ENCOUNTER — Ambulatory Visit: Payer: Self-pay | Admitting: *Deleted

## 2022-04-12 NOTE — Chronic Care Management (AMB) (Signed)
   04/12/2022  Zyir Gassert Pettey Jul 17, 1966 [id number]   Patient has not recently engaged with the CCM Team at [facility]. Removing RN Care Manager from Care Team and closing [place]. I will reach out to PharmD and/or LCSW to inform them of my case closure.  [name], BSN, RN-BC Embedded Chronic Care Manager [facility] [place] [facility] Direct Dial: [phone]

## 2023-04-23 ENCOUNTER — Encounter: Payer: Self-pay | Admitting: *Deleted
